# Patient Record
Sex: Male | Born: 1945 | Race: White | Hispanic: No | Smoking: Former smoker
Health system: Southern US, Community
[De-identification: ages and names within clinical notes are randomized; demographics above are authoritative.]

## PROBLEM LIST (undated history)

## (undated) DIAGNOSIS — C9590 Leukemia, unspecified not having achieved remission: Secondary | ICD-10-CM

## (undated) DIAGNOSIS — H409 Unspecified glaucoma: Secondary | ICD-10-CM

## (undated) DIAGNOSIS — M47816 Spondylosis without myelopathy or radiculopathy, lumbar region: Secondary | ICD-10-CM

## (undated) DIAGNOSIS — C9110 Chronic lymphocytic leukemia of B-cell type not having achieved remission: Principal | ICD-10-CM

## (undated) DIAGNOSIS — I2510 Atherosclerotic heart disease of native coronary artery without angina pectoris: Secondary | ICD-10-CM

## (undated) DIAGNOSIS — R0600 Dyspnea, unspecified: Secondary | ICD-10-CM

## (undated) DIAGNOSIS — R011 Cardiac murmur, unspecified: Secondary | ICD-10-CM

## (undated) DIAGNOSIS — I4891 Unspecified atrial fibrillation: Secondary | ICD-10-CM

## (undated) DIAGNOSIS — F1020 Alcohol dependence, uncomplicated: Secondary | ICD-10-CM

## (undated) DIAGNOSIS — G250 Essential tremor: Principal | ICD-10-CM

## (undated) DIAGNOSIS — F329 Major depressive disorder, single episode, unspecified: Secondary | ICD-10-CM

## (undated) DIAGNOSIS — E119 Type 2 diabetes mellitus without complications: Secondary | ICD-10-CM

## (undated) DIAGNOSIS — Q159 Congenital malformation of eye, unspecified: Secondary | ICD-10-CM

## (undated) DIAGNOSIS — M51369 Other intervertebral disc degeneration, lumbar region without mention of lumbar back pain or lower extremity pain: Secondary | ICD-10-CM

## (undated) DIAGNOSIS — D689 Coagulation defect, unspecified: Secondary | ICD-10-CM

## (undated) DIAGNOSIS — F1910 Other psychoactive substance abuse, uncomplicated: Secondary | ICD-10-CM

## (undated) DIAGNOSIS — G4730 Sleep apnea, unspecified: Secondary | ICD-10-CM

## (undated) DIAGNOSIS — I10 Essential (primary) hypertension: Secondary | ICD-10-CM

## (undated) DIAGNOSIS — M8580 Other specified disorders of bone density and structure, unspecified site: Secondary | ICD-10-CM

## (undated) DIAGNOSIS — M5136 Other intervertebral disc degeneration, lumbar region: Secondary | ICD-10-CM

## (undated) DIAGNOSIS — E785 Hyperlipidemia, unspecified: Secondary | ICD-10-CM

## (undated) DIAGNOSIS — C61 Malignant neoplasm of prostate: Secondary | ICD-10-CM

## (undated) DIAGNOSIS — I639 Cerebral infarction, unspecified: Secondary | ICD-10-CM

## (undated) DIAGNOSIS — F419 Anxiety disorder, unspecified: Secondary | ICD-10-CM

## (undated) DIAGNOSIS — K5790 Diverticulosis of intestine, part unspecified, without perforation or abscess without bleeding: Secondary | ICD-10-CM

## (undated) DIAGNOSIS — F32A Depression, unspecified: Secondary | ICD-10-CM

## (undated) DIAGNOSIS — M1990 Unspecified osteoarthritis, unspecified site: Secondary | ICD-10-CM

## (undated) DIAGNOSIS — H269 Unspecified cataract: Secondary | ICD-10-CM

## (undated) DIAGNOSIS — I219 Acute myocardial infarction, unspecified: Secondary | ICD-10-CM

## (undated) DIAGNOSIS — I499 Cardiac arrhythmia, unspecified: Secondary | ICD-10-CM

## (undated) DIAGNOSIS — D126 Benign neoplasm of colon, unspecified: Secondary | ICD-10-CM

## (undated) DIAGNOSIS — T7840XA Allergy, unspecified, initial encounter: Secondary | ICD-10-CM

## (undated) DIAGNOSIS — M810 Age-related osteoporosis without current pathological fracture: Secondary | ICD-10-CM

## (undated) HISTORY — PX: CATARACT EXTRACTION W/ INTRAOCULAR LENS IMPLANT: SHX1309

## (undated) HISTORY — PX: OTHER SURGICAL HISTORY: SHX169

## (undated) HISTORY — DX: Cardiac murmur, unspecified: R01.1

## (undated) HISTORY — DX: Alcohol dependence, uncomplicated: F10.20

## (undated) HISTORY — DX: Unspecified osteoarthritis, unspecified site: M19.90

## (undated) HISTORY — PX: CARDIAC CATHETERIZATION: SHX172

## (undated) HISTORY — DX: Age-related osteoporosis without current pathological fracture: M81.0

## (undated) HISTORY — DX: Sleep apnea, unspecified: G47.30

## (undated) HISTORY — DX: Diverticulosis of intestine, part unspecified, without perforation or abscess without bleeding: K57.90

## (undated) HISTORY — DX: Type 2 diabetes mellitus without complications: E11.9

## (undated) HISTORY — DX: Essential (primary) hypertension: I10

## (undated) HISTORY — DX: Hyperlipidemia, unspecified: E78.5

## (undated) HISTORY — DX: Other intervertebral disc degeneration, lumbar region without mention of lumbar back pain or lower extremity pain: M51.369

## (undated) HISTORY — PX: EYE SURGERY: SHX253

## (undated) HISTORY — PX: POLYPECTOMY: SHX149

## (undated) HISTORY — DX: Benign neoplasm of colon, unspecified: D12.6

## (undated) HISTORY — DX: Anxiety disorder, unspecified: F41.9

## (undated) HISTORY — DX: Cerebral infarction, unspecified: I63.9

## (undated) HISTORY — DX: Allergy, unspecified, initial encounter: T78.40XA

## (undated) HISTORY — DX: Unspecified glaucoma: H40.9

## (undated) HISTORY — DX: Chronic lymphocytic leukemia of B-cell type not having achieved remission: C91.10

## (undated) HISTORY — DX: Unspecified atrial fibrillation: I48.91

## (undated) HISTORY — DX: Depression, unspecified: F32.A

## (undated) HISTORY — DX: Atherosclerotic heart disease of native coronary artery without angina pectoris: I25.10

## (undated) HISTORY — DX: Other specified disorders of bone density and structure, unspecified site: M85.80

## (undated) HISTORY — DX: Unspecified cataract: H26.9

## (undated) HISTORY — PX: COLONOSCOPY: SHX174

## (undated) HISTORY — DX: Other psychoactive substance abuse, uncomplicated: F19.10

## (undated) HISTORY — DX: Spondylosis without myelopathy or radiculopathy, lumbar region: M47.816

## (undated) HISTORY — PX: CARDIAC VALVE REPLACEMENT: SHX585

## (undated) HISTORY — DX: Essential tremor: G25.0

## (undated) HISTORY — DX: Acute myocardial infarction, unspecified: I21.9

## (undated) HISTORY — DX: Leukemia, unspecified not having achieved remission: C95.90

## (undated) HISTORY — DX: Other intervertebral disc degeneration, lumbar region: M51.36

## (undated) HISTORY — DX: Malignant neoplasm of prostate: C61

## (undated) HISTORY — DX: Coagulation defect, unspecified: D68.9

## (undated) HISTORY — DX: Congenital malformation of eye, unspecified: Q15.9

## (undated) HISTORY — DX: Major depressive disorder, single episode, unspecified: F32.9

---

## 2000-05-22 DIAGNOSIS — R011 Cardiac murmur, unspecified: Secondary | ICD-10-CM

## 2000-05-22 HISTORY — DX: Cardiac murmur, unspecified: R01.1

## 2004-05-22 DIAGNOSIS — I219 Acute myocardial infarction, unspecified: Secondary | ICD-10-CM

## 2004-05-22 HISTORY — DX: Acute myocardial infarction, unspecified: I21.9

## 2004-08-04 HISTORY — PX: CORONARY ARTERY BYPASS GRAFT: SHX141

## 2013-07-02 ENCOUNTER — Telehealth: Payer: Self-pay | Admitting: Hematology and Oncology

## 2013-07-02 NOTE — Telephone Encounter (Signed)
C/D 07/02/13 for appt. 07/08/13

## 2013-07-02 NOTE — Telephone Encounter (Signed)
NEW PATIENT SCHEDULED FOR 02/17 @ 9:30 W/DR. [place].  DX- CLL WELCOME PACKET MAILED/EMAIL

## 2013-07-08 ENCOUNTER — Encounter: Payer: Self-pay | Admitting: Hematology and Oncology

## 2013-07-08 ENCOUNTER — Encounter (INDEPENDENT_AMBULATORY_CARE_PROVIDER_SITE_OTHER): Payer: Self-pay

## 2013-07-08 ENCOUNTER — Ambulatory Visit (HOSPITAL_BASED_OUTPATIENT_CLINIC_OR_DEPARTMENT_OTHER): Payer: Medicare Other | Admitting: Hematology and Oncology

## 2013-07-08 ENCOUNTER — Ambulatory Visit: Payer: Medicare Other

## 2013-07-08 ENCOUNTER — Other Ambulatory Visit (HOSPITAL_COMMUNITY)
Admission: RE | Admit: 2013-07-08 | Discharge: 2013-07-08 | Disposition: A | Discharge status: Home / Self Care | Payer: Medicare Other | Source: Ambulatory Visit | Attending: Hematology and Oncology | Admitting: Hematology and Oncology

## 2013-07-08 ENCOUNTER — Ambulatory Visit (HOSPITAL_BASED_OUTPATIENT_CLINIC_OR_DEPARTMENT_OTHER): Payer: Medicare Other

## 2013-07-08 ENCOUNTER — Telehealth: Payer: Self-pay | Admitting: Hematology and Oncology

## 2013-07-08 VITALS — BP 129/67 | HR 65 | Temp 97.7°F | Resp 18 | Ht 69.0 in | Wt 205.3 lb

## 2013-07-08 DIAGNOSIS — C61 Malignant neoplasm of prostate: Secondary | ICD-10-CM | POA: Diagnosis not present

## 2013-07-08 DIAGNOSIS — C9110 Chronic lymphocytic leukemia of B-cell type not having achieved remission: Secondary | ICD-10-CM

## 2013-07-08 DIAGNOSIS — D7289 Other specified disorders of white blood cells: Secondary | ICD-10-CM | POA: Diagnosis not present

## 2013-07-08 HISTORY — DX: Malignant neoplasm of prostate: C61

## 2013-07-08 HISTORY — DX: Chronic lymphocytic leukemia of B-cell type not having achieved remission: C91.10

## 2013-07-08 LAB — COMPREHENSIVE METABOLIC PANEL
ALT: 22 U/L (ref 0–53)
AST: 20 U/L (ref 0–37)
Albumin: 4.4 g/dL (ref 3.5–5.2)
Alkaline Phosphatase: 37 U/L — ABNORMAL LOW (ref 39–117)
BUN: 14 mg/dL (ref 6–23)
CALCIUM: 9.3 mg/dL (ref 8.4–10.5)
CO2: 25 mEq/L (ref 19–32)
CREATININE: 0.84 mg/dL (ref 0.50–1.35)
Chloride: 107 mEq/L (ref 96–112)
Glucose, Bld: 90 mg/dL (ref 70–99)
Potassium: 4 mEq/L (ref 3.5–5.3)
Sodium: 141 mEq/L (ref 135–145)
Total Bilirubin: 0.3 mg/dL (ref 0.2–1.2)
Total Protein: 6.7 g/dL (ref 6.0–8.3)

## 2013-07-08 LAB — CBC WITH DIFFERENTIAL/PLATELET
BASO%: 0.3 % (ref 0.0–2.0)
Basophils Absolute: 0 10*3/uL (ref 0.0–0.1)
EOS ABS: 0.2 10*3/uL (ref 0.0–0.5)
EOS%: 1.1 % (ref 0.0–7.0)
HEMATOCRIT: 41.2 % (ref 38.4–49.9)
HGB: 14.2 g/dL (ref 13.0–17.1)
LYMPH#: 8.3 10*3/uL — AB (ref 0.9–3.3)
LYMPH%: 51.7 % — ABNORMAL HIGH (ref 14.0–49.0)
MCH: 30.9 pg (ref 27.2–33.4)
MCHC: 34.5 g/dL (ref 32.0–36.0)
MCV: 89.8 fL (ref 79.3–98.0)
MONO#: 0.8 10*3/uL (ref 0.1–0.9)
MONO%: 5 % (ref 0.0–14.0)
NEUT%: 41.9 % (ref 39.0–75.0)
NEUTROS ABS: 6.7 10*3/uL — AB (ref 1.5–6.5)
Platelets: 238 10*3/uL (ref 140–400)
RBC: 4.59 10*6/uL (ref 4.20–5.82)
RDW: 13.1 % (ref 11.0–14.6)
WBC: 16 10*3/uL — ABNORMAL HIGH (ref 4.0–10.3)

## 2013-07-08 LAB — LACTATE DEHYDROGENASE: LDH: 178 U/L (ref 94–250)

## 2013-07-08 LAB — TECHNOLOGIST REVIEW

## 2013-07-08 NOTE — Telephone Encounter (Signed)
Gave pt appt for lab and MD  for August 2015 °

## 2013-07-09 DIAGNOSIS — N529 Male erectile dysfunction, unspecified: Secondary | ICD-10-CM | POA: Diagnosis not present

## 2013-07-09 DIAGNOSIS — Z8546 Personal history of malignant neoplasm of prostate: Secondary | ICD-10-CM | POA: Diagnosis not present

## 2013-07-09 LAB — PSA: PSA: 0.02 ng/mL (ref <=4.00)

## 2013-07-09 LAB — IGG, IGA, IGM
IGG (IMMUNOGLOBIN G), SERUM: 572 mg/dL — AB (ref 650–1600)
IGM, SERUM: 30 mg/dL — AB (ref 41–251)
IgA: 129 mg/dL (ref 68–379)

## 2013-07-09 NOTE — Progress Notes (Signed)
[facility] CONSULT NOTE  Patient has no care team.  CHIEF COMPLAINTS/PURPOSE OF CONSULTATION:  To establish care for prior diagnosis of CLL   HISTORY OF PRESENTING ILLNESS:  Rodney Friedman 68 y.o. male is here because of prior diagnosis of CLL. According to the patient, when he was in his 57s, his white count was around 11. Over the past 30 years, he was being followed and his white count got progressively worse, peak at 19. He was subsequently diagnosed with CLL and was being observed only. The patient also have family history of cancer and PSA came back abnormal last year. He was subsequently found to have prostate cancer and underwent radiation therapy and monthly whereupon injection. He has lost about 20 pounds of weight from his treatment. The patient have occasional sweats. Denies any history of recurrent infection, history of skin cancer or abnormal lymphadenopathy. MEDICAL HISTORY:  Past Medical History  Diagnosis Date  . Diabetes mellitus without complication   . Leukemia     CLL  . Prostate cancer   . Cataract   . Hypertension   . 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 Prostate cancer 07/08/2013    SURGICAL HISTORY: Past Surgical History  Procedure Laterality Date  . 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 Colonoscopy      SOCIAL HISTORY: History   Social History  . Marital Status: Unknown    Spouse Name: N/A    Number of Children: N/A  . Years of Education: N/A   Occupational History  . Not on file.   Social History Main Topics  . Smoking status: Current Every Day Smoker -- 0.50 packs/day for 50 years  . Smokeless tobacco: Never Used  . Alcohol Use: No  . Drug Use: No  . Sexual Activity: Not on file   Other Topics Concern  . Not on file   Social History Narrative  . No narrative on file    FAMILY HISTORY: Family History  Problem Relation Age of Onset  . Cancer Mother     colon cancer  . Cancer Father     prostate  ca    ALLERGIES:  has No Known Allergies.  MEDICATIONS:  Current Outpatient Prescriptions  Medication Sig Dispense Refill  . amLODipine (NORVASC) 10 MG tablet Take 10 mg by mouth daily.      Marland Kitchen buPROPion (WELLBUTRIN SR) 150 MG 12 hr tablet Take 150 mg by mouth 2 (two) times daily.      Marland Kitchen lamoTRIgine (LAMICTAL) 100 MG tablet Take 300 mg by mouth daily.      Marland Kitchen latanoprost (XALATAN) 0.005 % ophthalmic solution Once a day every third day      . Leuprolide Acetate (LUPRON DEPOT IM) Inject into the muscle every 30 (thirty) days.      . metFORMIN (GLUCOPHAGE) 500 MG tablet Take 500 mg by mouth 2 (two) times daily.      . metoprolol (TOPROL-XL) 200 MG 24 hr tablet Take 100 mg by mouth daily.      . pravastatin (PRAVACHOL) 80 MG tablet Take 80 mg by mouth daily.      . valsartan (DIOVAN) 320 MG tablet Take 320 mg by mouth daily.      Marland Kitchen warfarin (COUMADIN) 4 MG tablet Take 4 mg by mouth daily.       No current facility-administered medications for this visit.    REVIEW OF SYSTEMS:   Constitutional: Denies fevers, chills  Eyes: Denies blurriness of vision, double vision  or watery eyes Ears, nose, mouth, throat, and face: Denies mucositis or sore throat Respiratory: Denies cough, dyspnea or wheezes Cardiovascular: Denies palpitation, chest discomfort or lower extremity swelling Gastrointestinal:  Denies nausea, heartburn or change in bowel habits Skin: Denies abnormal skin rashes Lymphatics: Denies new lymphadenopathy or easy bruising Neurological:Denies numbness, tingling or new weaknesses Behavioral/Psych: Mood is stable, no new changes  All other systems were reviewed with the patient and are negative.  PHYSICAL EXAMINATION: ECOG PERFORMANCE STATUS: 0 - Asymptomatic  Filed Vitals:   07/08/13 0954  BP: 129/67  Pulse: 65  Temp: 97.7 F (36.5 C)  Resp: 18   Filed Weights   07/08/13 0954  Weight: 205 lb 4.8 oz (93.123 kg)    GENERAL:alert, no distress and comfortable SKIN: skin  color, texture, turgor are normal, no rashes or significant lesions EYES: normal, conjunctiva are pink and non-injected, sclera clear OROPHARYNX:no exudate, no erythema and lips, buccal mucosa, and tongue normal  NECK: supple, thyroid normal size, non-tender, without nodularity LYMPH:  no palpable lymphadenopathy in the cervical, axillary or inguinal LUNGS: clear to auscultation and percussion with normal breathing effort HEART: regular rate & rhythm and no murmurs and no lower extremity edema ABDOMEN:abdomen soft, non-tender and normal bowel sounds Musculoskeletal:no cyanosis of digits and no clubbing  PSYCH: alert & oriented x 3 with fluent speech NEURO: no focal motor/sensory deficits  LABORATORY DATA:  I have reviewed the data as listed Lab Results  Component Value Date   WBC 16.0* 07/08/2013   HGB 14.2 07/08/2013   HCT 41.2 07/08/2013   MCV 89.8 07/08/2013   PLT 238 07/08/2013   Lab Results  Component Value Date   NA 141 07/08/2013   K 4.0 07/08/2013   CL 107 07/08/2013   CO2 25 07/08/2013   ASSESSMENT & PLAN:  #1 CLL I will proceed with repeating some blood work and FISH analysis to understand the behavior of his CLL. Given his relative indolent course, I plan to see him back one more time in 6 months and then yearly after that unless he has aggressive features #2 history of prostate cancer The patient requested a PSA test today. I will call the patient with results. I would defer all treatment and recommendations to his urologist #3 preventive care He is up-to-date with influenza vaccination and pneumonia vaccination  Orders Placed This Encounter  Procedures  . PSA    Standing Status: Future     Number of Occurrences: 1     Standing Expiration Date: 07/08/2014  . Comprehensive metabolic panel    Standing Status: Future     Number of Occurrences: 1     Standing Expiration Date: 07/08/2014  . CBC with Differential    Standing Status: Standing     Number of Occurrences: 2      Standing Expiration Date: 07/08/2014  . Lactate dehydrogenase    Standing Status: Future     Number of Occurrences: 1     Standing Expiration Date: 07/08/2014  . IgG, IgA, IgM    Standing Status: Future     Number of Occurrences: 1     Standing Expiration Date: 07/08/2014  . FISH, Peripheral Blood    FISH for CLL    Standing Status: Future     Number of Occurrences: 1     Standing Expiration Date: 07/08/2014  . Flow Cytometry    CLL    Standing Status: Future     Number of Occurrences: 1     Standing  Expiration Date: 07/08/2014    All questions were answered. The patient knows to call the clinic with any problems, questions or concerns. I spent 40 minutes counseling the patient face to face. The total time spent in the appointment was 55 minutes and more than 50% was on counseling.     [place], [place], MD 07/09/2013 10:07 AM

## 2013-07-10 LAB — FLOW CYTOMETRY

## 2013-07-15 LAB — FISH, PERIPHERAL BLOOD

## 2013-07-15 LAB — TISSUE HYBRIDIZATION TO NCBH

## 2013-07-17 ENCOUNTER — Telehealth: Payer: Self-pay | Admitting: *Deleted

## 2013-07-17 NOTE — Telephone Encounter (Signed)
Pt called looking for his PSA level on My Chart.  By the time I called him back he got the results in a separate email.  He understands the result is normal.

## 2013-08-06 DIAGNOSIS — C61 Malignant neoplasm of prostate: Secondary | ICD-10-CM | POA: Diagnosis not present

## 2013-08-14 DIAGNOSIS — I2510 Atherosclerotic heart disease of native coronary artery without angina pectoris: Secondary | ICD-10-CM | POA: Diagnosis not present

## 2013-08-14 DIAGNOSIS — E669 Obesity, unspecified: Secondary | ICD-10-CM | POA: Diagnosis not present

## 2013-08-14 DIAGNOSIS — C61 Malignant neoplasm of prostate: Secondary | ICD-10-CM | POA: Diagnosis not present

## 2013-08-14 DIAGNOSIS — Z7901 Long term (current) use of anticoagulants: Secondary | ICD-10-CM | POA: Diagnosis not present

## 2013-08-14 DIAGNOSIS — H409 Unspecified glaucoma: Secondary | ICD-10-CM | POA: Diagnosis not present

## 2013-08-21 DIAGNOSIS — Z7901 Long term (current) use of anticoagulants: Secondary | ICD-10-CM | POA: Diagnosis not present

## 2013-09-03 ENCOUNTER — Ambulatory Visit (INDEPENDENT_AMBULATORY_CARE_PROVIDER_SITE_OTHER): Payer: Medicare Other | Admitting: Family Medicine

## 2013-09-03 VITALS — BP 138/62 | HR 63 | Temp 98.3°F | Resp 16 | Ht 69.0 in | Wt 202.8 lb

## 2013-09-03 DIAGNOSIS — C61 Malignant neoplasm of prostate: Secondary | ICD-10-CM

## 2013-09-03 DIAGNOSIS — R7309 Other abnormal glucose: Secondary | ICD-10-CM | POA: Diagnosis not present

## 2013-09-03 DIAGNOSIS — Z5181 Encounter for therapeutic drug level monitoring: Secondary | ICD-10-CM

## 2013-09-03 DIAGNOSIS — Z8249 Family history of ischemic heart disease and other diseases of the circulatory system: Secondary | ICD-10-CM

## 2013-09-03 DIAGNOSIS — I2510 Atherosclerotic heart disease of native coronary artery without angina pectoris: Secondary | ICD-10-CM

## 2013-09-03 DIAGNOSIS — Z86010 Personal history of colonic polyps: Secondary | ICD-10-CM

## 2013-09-03 DIAGNOSIS — I25118 Atherosclerotic heart disease of native coronary artery with other forms of angina pectoris: Secondary | ICD-10-CM | POA: Insufficient documentation

## 2013-09-03 DIAGNOSIS — R7303 Prediabetes: Secondary | ICD-10-CM | POA: Insufficient documentation

## 2013-09-03 DIAGNOSIS — Z7901 Long term (current) use of anticoagulants: Secondary | ICD-10-CM | POA: Diagnosis not present

## 2013-09-03 LAB — POCT CBC
Granulocyte percent: 46.9 %G (ref 37–80)
HCT, POC: 47 % (ref 43.5–53.7)
HEMOGLOBIN: 15.4 g/dL (ref 14.1–18.1)
Lymph, poc: 8 — AB (ref 0.6–3.4)
MCH, POC: 31.1 pg (ref 27–31.2)
MCHC: 32.8 g/dL (ref 31.8–35.4)
MCV: 95 fL (ref 80–97)
MID (cbc): 0.8 (ref 0–0.9)
MPV: 9.7 fL (ref 0–99.8)
PLATELET COUNT, POC: 270 10*3/uL (ref 142–424)
POC GRANULOCYTE: 7.7 — AB (ref 2–6.9)
POC LYMPH PERCENT: 48.5 %L (ref 10–50)
POC MID %: 4.6 %M (ref 0–12)
RBC: 4.95 M/uL (ref 4.69–6.13)
RDW, POC: 13.7 %
WBC: 16.5 10*3/uL — AB (ref 4.6–10.2)

## 2013-09-03 LAB — PROTIME-INR
INR: 2.05 — ABNORMAL HIGH (ref <1.50)
PROTHROMBIN TIME: 22.7 s — AB (ref 11.6–15.2)

## 2013-09-03 LAB — BASIC METABOLIC PANEL
BUN: 12 mg/dL (ref 6–23)
CHLORIDE: 105 meq/L (ref 96–112)
CO2: 27 mEq/L (ref 19–32)
CREATININE: 0.78 mg/dL (ref 0.50–1.35)
Calcium: 9.9 mg/dL (ref 8.4–10.5)
GLUCOSE: 116 mg/dL — AB (ref 70–99)
Potassium: 4.2 mEq/L (ref 3.5–5.3)
Sodium: 143 mEq/L (ref 135–145)

## 2013-09-03 LAB — GLUCOSE, POCT (MANUAL RESULT ENTRY): POC Glucose: 104 mg/dl — AB (ref 70–99)

## 2013-09-03 LAB — POCT GLYCOSYLATED HEMOGLOBIN (HGB A1C): Hemoglobin A1C: 5.6

## 2013-09-03 NOTE — Progress Notes (Signed)
Subjective:     Patient ID: Rodney Friedman, male   DOB: May 21, 1946, 68 y.o.   MRN: [id number]  HPI 68 yr male here to establish care.  Has a significant pmdhx including prostate ca, CLL for nearly 20 years, hx of triple bypass in 2006 on warfarin, and prediabetes.  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    Heart disease: Pt taking 4 mg of warfarin 6 days per week and 6 mg 1 day per week; this was an increase from 4mg  per day at her last check.  Needs to check PT/INR.  Will likely need a cardiologist referal.  No issues currently.   Prostate Ca: Needs a PSA, was diagnosed with prostate ca in 2014 tx w radiation on lupron currently. Having some leakage but no change in urinary habits since dx.  On radiation tx.  Urology apt in August.  Followed at the cancer center.   CLL: Also dx with CLL 15 years ago has never had a crisis. Last checked CBC in Feb 2015.  Wbc count runs 15- 20k at baseline per hier report   Also dx w depression, bipolar and anxiety for which he takes lamictal and wellbutrin since 2006.  Seems conroled per pt.  Pt goes to [employer] which seems to help w anxiety he states.    Prediabetes:  Metformin for the past 2 years last for a prediabetic state does not know last A1C. Recently lost 20 lbs.   Other: Last colonoscopy in 2012,  Removed 5 polyps.  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  Sees Dr. 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       Review of Systems  Constitutional: Negative for fever, chills, diaphoresis, fatigue and unexpected weight change.  HENT: Negative for congestion, rhinorrhea and sneezing.   Eyes: Negative for pain and discharge.  Respiratory: Negative for cough, chest tightness and shortness of breath.   Cardiovascular: Negative for chest pain, palpitations and leg swelling.  Gastrointestinal:  Negative for abdominal pain, diarrhea, constipation and blood in stool.  Endocrine: Negative for polydipsia and polyuria.  Genitourinary: Negative for urgency, frequency, hematuria, penile pain and testicular pain.  Musculoskeletal: Negative for arthralgias, back pain and joint swelling.  Skin: Negative for color change and rash.  Allergic/Immunologic: Negative for food allergies.  Neurological: Negative for dizziness, syncope, light-headedness and headaches.  Psychiatric/Behavioral: The patient is nervous/anxious.        Objective:   Physical Exam  Constitutional: He is oriented to person, place, and time. He appears well-developed and well-nourished.  HENT:  Head: Normocephalic and atraumatic.  Cardiovascular: Normal rate and regular rhythm.  Exam reveals no gallop and no friction rub.   Murmur heard. Pulmonary/Chest: Effort normal and breath sounds normal. No respiratory distress. He has no wheezes. He has no rales.  Abdominal: Soft. He exhibits no distension.  Musculoskeletal: Normal range of motion. He exhibits no edema and no tenderness.  Lymphadenopathy:    He has cervical adenopathy.  Neurological: He is alert and oriented to person, place, and time. No cranial nerve deficit.  Skin: Skin is warm and dry.  Psychiatric: His behavior is normal. Judgment and thought content normal.   Filed Vitals:   09/03/13 0820  BP: 138/62  Pulse: 63  Temp: 98.3 F (36.8 C)  Resp: 16   Results for orders placed in visit  on 09/03/13  POCT GLYCOSYLATED HEMOGLOBIN (HGB A1C)      Result Value Ref Range   Hemoglobin A1C 5.6    GLUCOSE, POCT (MANUAL RESULT ENTRY)      Result Value Ref Range   POC Glucose 104 (*) 70 - 99 mg/dl  POCT CBC      Result Value Ref Range   WBC 16.5 (*) 4.6 - 10.2 K/uL   Lymph, poc 8.0 (*) 0.6 - 3.4   POC LYMPH PERCENT 48.5  10 - 50 %L   MID (cbc) 0.8  0 - 0.9   POC MID % 4.6  0 - 12 %M   POC Granulocyte 7.7 (*) 2 - 6.9   Granulocyte percent 46.9  37 - 80 %G    RBC 4.95  4.69 - 6.13 M/uL   Hemoglobin 15.4  14.1 - 18.1 g/dL   HCT, POC 47.0  43.5 - 53.7 %   MCV 95.0  80 - 97 fL   MCH, POC 31.1  27 - 31.2 pg   MCHC 32.8  31.8 - 35.4 g/dL   RDW, POC 13.7     Platelet Count, POC 270  142 - 424 K/uL   MPV 9.7  0 - 99.8 fL        Assessment:    1. Pt here to establish care.        Plan:  Pre-diabetes - Plan: POCT glycosylated hemoglobin (Hb O2D), Basic metabolic panel, POCT glucose (manual entry), POCT CBC  Family history of CABG - Plan: POCT CBC, Ambulatory referral to Cardiology  Prostate cancer - Plan: PSA, POCT CBC  Encounter for monitoring coumadin therapy - Plan: Protime-INR, POCT CBC  Personal history of colonic polyps - Plan: Ambulatory referral to Gastroenterology  Rodney Friedman is here to establish care today.  He has significant medical problems including CLL, prostate cancer, CAD/ history of CABG.  Needs referrals to cardiology and GI.   Check labs today as above He will plan to establish with coumadin clinic as we do not have a PT/INR machine in house.   A1c is fine BP controlled He is already in with urology and heme/ onc

## 2013-09-03 NOTE — Patient Instructions (Signed)
I will be in touch with your PT/INR when it comes in.   We will get your in to see cardiology and a coumadin clinic.    Your BP and A1c look good today.   We will make you a referral, but you can certainly call and make your own appt with cardiology.  Please let them know you also need coumadin monitoring  [facility] [address] [place], [place] [address] Get Driving Directions View Web Site  Main: [phone]

## 2013-09-04 ENCOUNTER — Encounter: Payer: Self-pay | Admitting: Internal Medicine

## 2013-09-04 LAB — PSA: PSA: 0.02 ng/mL (ref <=4.00)

## 2013-10-06 DIAGNOSIS — H02839 Dermatochalasis of unspecified eye, unspecified eyelid: Secondary | ICD-10-CM | POA: Diagnosis not present

## 2013-10-06 DIAGNOSIS — H31019 Macula scars of posterior pole (postinflammatory) (post-traumatic), unspecified eye: Secondary | ICD-10-CM | POA: Diagnosis not present

## 2013-10-06 DIAGNOSIS — H409 Unspecified glaucoma: Secondary | ICD-10-CM | POA: Diagnosis not present

## 2013-10-06 DIAGNOSIS — H4011X0 Primary open-angle glaucoma, stage unspecified: Secondary | ICD-10-CM | POA: Diagnosis not present

## 2013-10-06 DIAGNOSIS — IMO0002 Reserved for concepts with insufficient information to code with codable children: Secondary | ICD-10-CM | POA: Insufficient documentation

## 2013-10-06 DIAGNOSIS — H2510 Age-related nuclear cataract, unspecified eye: Secondary | ICD-10-CM | POA: Diagnosis not present

## 2013-10-20 ENCOUNTER — Telehealth: Payer: Self-pay

## 2013-10-20 NOTE — Telephone Encounter (Signed)
Reached pt.  Scheduled OV for anti-coag therapy.

## 2013-10-20 NOTE — Telephone Encounter (Signed)
Attempted to call pt concerning previous procedure in [place] 3 +yrs ago and Coumadin.  Busy signal.  Pt scheduled for PV tomorrow.

## 2013-10-22 ENCOUNTER — Telehealth: Payer: Self-pay | Admitting: *Deleted

## 2013-10-22 NOTE — Telephone Encounter (Signed)
10/22/2013   RE: Rodney Friedman DOB: July 25, 1945 MRN: [id number]   Dear Dr. Lorelei Pont,    We have scheduled the above patient for an Colonoscopy. Our records show that he is on Coumadin.   Please advise as to how long the patient may come off his therapy of Coumadin prior to the procedure, which is scheduled for 11-04-2013.  Please fax back/ or route the completed form to Evette Georges., CMA.   Sincerely,    Carlyle Dolly

## 2013-10-24 ENCOUNTER — Encounter: Payer: Self-pay | Admitting: Family Medicine

## 2013-10-24 NOTE — Telephone Encounter (Signed)
Called and [facility], will also send mychart message.  I do need him to see cardiology/ coumadin clinic to manage his coumadin/ heart issues and also make recommendation regarding if he can stop his coumadin for a colonoscopy.  Asked him to please call [facility] as we had discussed.

## 2013-10-24 NOTE — Telephone Encounter (Signed)
Message copied by Carlyle Dolly on Fri Oct 24, 2013  2:08 PM ------      Message from: Darreld Mclean      Created: Fri Oct 24, 2013  1:07 PM       Hi Claiborne Billings,            I did see [name] once a couple of months ago to establish care and wrote for his coumadin.  The plan was for him to see cardiology and coumadin clinic to manage his coumadin and histoyr of heart disease.  However he has not yet made this appointment.  I am not familiar enough with his heart condition to make a recommendation regarding his coumadin.  I will again request that he follow-up with cardiology but the colonoscopy may have to be delayed.              JC ------

## 2013-10-24 NOTE — Telephone Encounter (Signed)
Darreld Mclean, MD Carlyle Dolly, [place],   I did see [name] once a couple of months ago to establish care and wrote for his coumadin. The plan was for him to see cardiology and coumadin clinic to manage his coumadin and histoyr of heart disease. However he has not yet made this appointment. I am not familiar enough with his heart condition to make a recommendation regarding his coumadin. I will again request that he follow-up with cardiology but the colonoscopy may have to be delayed.   JC      I called patient and advised that he will need to establish with [facility] I advised patient that per Dr. 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

## 2013-10-28 ENCOUNTER — Ambulatory Visit: Payer: 59 | Admitting: Gastroenterology

## 2013-11-04 ENCOUNTER — Encounter: Payer: 59 | Admitting: Internal Medicine

## 2013-11-25 DIAGNOSIS — H02839 Dermatochalasis of unspecified eye, unspecified eyelid: Secondary | ICD-10-CM | POA: Diagnosis not present

## 2013-11-25 DIAGNOSIS — H2510 Age-related nuclear cataract, unspecified eye: Secondary | ICD-10-CM | POA: Diagnosis not present

## 2013-11-25 DIAGNOSIS — H31019 Macula scars of posterior pole (postinflammatory) (post-traumatic), unspecified eye: Secondary | ICD-10-CM | POA: Diagnosis not present

## 2013-11-25 DIAGNOSIS — H409 Unspecified glaucoma: Secondary | ICD-10-CM | POA: Diagnosis not present

## 2013-11-25 DIAGNOSIS — H4011X0 Primary open-angle glaucoma, stage unspecified: Secondary | ICD-10-CM | POA: Diagnosis not present

## 2013-12-03 DIAGNOSIS — H4011X0 Primary open-angle glaucoma, stage unspecified: Secondary | ICD-10-CM | POA: Diagnosis not present

## 2013-12-03 DIAGNOSIS — H5200 Hypermetropia, unspecified eye: Secondary | ICD-10-CM | POA: Diagnosis not present

## 2013-12-03 DIAGNOSIS — H31019 Macula scars of posterior pole (postinflammatory) (post-traumatic), unspecified eye: Secondary | ICD-10-CM | POA: Diagnosis not present

## 2013-12-08 ENCOUNTER — Ambulatory Visit (INDEPENDENT_AMBULATORY_CARE_PROVIDER_SITE_OTHER): Payer: Medicare Other | Admitting: Emergency Medicine

## 2013-12-08 VITALS — BP 118/80 | HR 59 | Temp 98.3°F | Resp 16 | Ht 68.75 in | Wt 209.2 lb

## 2013-12-08 DIAGNOSIS — I2510 Atherosclerotic heart disease of native coronary artery without angina pectoris: Secondary | ICD-10-CM

## 2013-12-08 DIAGNOSIS — C61 Malignant neoplasm of prostate: Secondary | ICD-10-CM

## 2013-12-08 DIAGNOSIS — Z5181 Encounter for therapeutic drug level monitoring: Secondary | ICD-10-CM | POA: Diagnosis not present

## 2013-12-08 DIAGNOSIS — E782 Mixed hyperlipidemia: Secondary | ICD-10-CM | POA: Diagnosis not present

## 2013-12-08 DIAGNOSIS — Z7901 Long term (current) use of anticoagulants: Secondary | ICD-10-CM | POA: Diagnosis not present

## 2013-12-08 DIAGNOSIS — E119 Type 2 diabetes mellitus without complications: Secondary | ICD-10-CM

## 2013-12-08 DIAGNOSIS — I10 Essential (primary) hypertension: Secondary | ICD-10-CM

## 2013-12-08 LAB — CBC
HEMATOCRIT: 39.1 % (ref 39.0–52.0)
Hemoglobin: 14 g/dL (ref 13.0–17.0)
MCH: 30.8 pg (ref 26.0–34.0)
MCHC: 35.8 g/dL (ref 30.0–36.0)
MCV: 86.1 fL (ref 78.0–100.0)
PLATELETS: 224 10*3/uL (ref 150–400)
RBC: 4.54 MIL/uL (ref 4.22–5.81)
RDW: 14.1 % (ref 11.5–15.5)
WBC: 16.1 10*3/uL — AB (ref 4.0–10.5)

## 2013-12-08 LAB — POCT GLYCOSYLATED HEMOGLOBIN (HGB A1C): HEMOGLOBIN A1C: 5.8

## 2013-12-08 NOTE — Progress Notes (Signed)
[facility] [address] [place] [address] [phone]  Date:  12/08/2013   Name:  Rodney Friedman   DOB:  02/17/1946   MRN:  [id number]  PCP:  Berkley Harvey, NP    Chief Complaint: Lab   History of Present Illness:  Mesiah Friedman is a 68 y.o. very pleasant male patient who presents with the following:  Moved from [place].  Has history of CLL and prostate CA.  On coumadin for post CABG. Last seen in April.  Pending appt with cardiology and urology. No angina, shortness of breath.  No nausea or vomiting.  No fever or chills.  No improvement with over the counter medications or other home remedies. Denies other complaint or health concern today.   Patient Active Problem List   Diagnosis Date Noted  . CAD (coronary artery disease) 09/03/2013  . Pre-diabetes 09/03/2013  . 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 Prostate cancer 07/08/2013    Past Medical History  Diagnosis Date  . Diabetes mellitus without complication   . Leukemia     CLL  . Prostate cancer   . Cataract   . Hypertension   . 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 Prostate cancer 07/08/2013  . Anxiety   . Depression   . Heart murmur   . Myocardial infarction   . Substance abuse   . Tuberculosis     Past Surgical History  Procedure Laterality Date  . 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 Colonoscopy    . Eye surgery      History  Substance Use Topics  . Smoking status: Current Every Day Smoker -- 0.50 packs/day for 50 years  . Smokeless tobacco: Never Used  . Alcohol Use: No    Family History  Problem Relation Age of Onset  . Cancer Mother     colon cancer  . Cancer Father     prostate ca  . Hypertension Father     No Known Allergies  Medication list has been reviewed and updated.  Current Outpatient Prescriptions on File Prior to Visit  Medication Sig Dispense Refill  . amLODipine (NORVASC) 10 MG tablet Take 10 mg by mouth daily.       Marland Kitchen buPROPion (WELLBUTRIN SR) 150 MG 12 hr tablet Take 150 mg by mouth 2 (two) times daily.      Marland Kitchen lamoTRIgine (LAMICTAL) 100 MG tablet Take 300 mg by mouth daily.      Marland Kitchen latanoprost (XALATAN) 0.005 % ophthalmic solution Once a day every third day      . Leuprolide Acetate (LUPRON DEPOT IM) Inject into the muscle every 6 (six) months.       . metFORMIN (GLUCOPHAGE) 500 MG tablet Take 500 mg by mouth 2 (two) times daily.      . metoprolol (TOPROL-XL) 200 MG 24 hr tablet Take 100 mg by mouth daily.      . pravastatin (PRAVACHOL) 80 MG tablet Take 80 mg by mouth daily.      . valsartan (DIOVAN) 320 MG tablet Take 320 mg by mouth daily.      Marland Kitchen warfarin (COUMADIN) 4 MG tablet Take 4 mg by mouth daily.       No current facility-administered medications on file prior to visit.    Review of Systems:  As per HPI, otherwise negative.    Physical Examination: Filed Vitals:   12/08/13 1424  BP: 118/80  Pulse: 59  Temp: 98.3 F (36.8 C)  Resp: 16   Filed Vitals:   12/08/13 1424  Height: 5' 8.75" (1.746 m)  Weight: 209 lb 3.2 oz (94.892 kg)   Body mass index is 31.13 kg/(m^2). Ideal Body Weight: Weight in (lb) to have BMI = 25: 167.7  GEN: WDWN, NAD, Non-toxic, A & O x 3 HEENT: Atraumatic, Normocephalic. Neck supple. No masses, No LAD. Ears and Nose: No external deformity. CV: IRRR, loud harsh murmur No /G/R. No JVD. No thrill. No extra heart sounds. PULM: CTA B, no wheezes, crackles, rhonchi. No retractions. No resp. distress. No accessory muscle use. ABD: S, NT, ND, +BS. No rebound. No HSM. EXTR: No c/c/e NEURO Normal gait.  PSYCH: Normally interactive. Conversant. Not depressed or anxious appearing.  Calm demeanor.    Assessment and Plan: CLL Prostate CA  Signed,  Ellison Carwin, MD    Results for orders placed in visit on 12/08/13  CBC      Result Value Ref Range   WBC 16.1 (*) 4.0 - 10.5 K/uL   RBC 4.54  4.22 - 5.81 MIL/uL   Hemoglobin 14.0  13.0 - 17.0 g/dL    HCT 39.1  39.0 - 52.0 %   MCV 86.1  78.0 - 100.0 fL   MCH 30.8  26.0 - 34.0 pg   MCHC 35.8  30.0 - 36.0 g/dL   RDW 14.1  11.5 - 15.5 %   Platelets 224  150 - 400 K/uL  COMPREHENSIVE METABOLIC PANEL      Result Value Ref Range   Sodium 142  135 - 145 mEq/L   Potassium 4.2  3.5 - 5.3 mEq/L   Chloride 108  96 - 112 mEq/L   CO2 21  19 - 32 mEq/L   Glucose, Bld 90  70 - 99 mg/dL   BUN 14  6 - 23 mg/dL   Creat 0.80  0.50 - 1.35 mg/dL   Total Bilirubin 0.5  0.2 - 1.2 mg/dL   Alkaline Phosphatase 43  39 - 117 U/L   AST 15  0 - 37 U/L   ALT 15  0 - 53 U/L   Total Protein 6.5  6.0 - 8.3 g/dL   Albumin 4.2  3.5 - 5.2 g/dL   Calcium 9.2  8.4 - 10.5 mg/dL  PSA      Result Value Ref Range   PSA 0.04  <=4.00 ng/mL  LIPID PANEL      Result Value Ref Range   Cholesterol 219 (*) 0 - 200 mg/dL   Triglycerides 237 (*) <150 mg/dL   HDL 35 (*) >39 mg/dL   Total CHOL/HDL Ratio 6.3     VLDL 47 (*) 0 - 40 mg/dL   LDL Cholesterol 137 (*) 0 - 99 mg/dL  POCT GLYCOSYLATED HEMOGLOBIN (HGB A1C)      Result Value Ref Range   Hemoglobin A1C 5.8

## 2013-12-09 LAB — COMPREHENSIVE METABOLIC PANEL
ALT: 15 U/L (ref 0–53)
AST: 15 U/L (ref 0–37)
Albumin: 4.2 g/dL (ref 3.5–5.2)
Alkaline Phosphatase: 43 U/L (ref 39–117)
BUN: 14 mg/dL (ref 6–23)
CHLORIDE: 108 meq/L (ref 96–112)
CO2: 21 mEq/L (ref 19–32)
CREATININE: 0.8 mg/dL (ref 0.50–1.35)
Calcium: 9.2 mg/dL (ref 8.4–10.5)
Glucose, Bld: 90 mg/dL (ref 70–99)
Potassium: 4.2 mEq/L (ref 3.5–5.3)
Sodium: 142 mEq/L (ref 135–145)
Total Bilirubin: 0.5 mg/dL (ref 0.2–1.2)
Total Protein: 6.5 g/dL (ref 6.0–8.3)

## 2013-12-09 LAB — LIPID PANEL
CHOL/HDL RATIO: 6.3 ratio
Cholesterol: 219 mg/dL — ABNORMAL HIGH (ref 0–200)
HDL: 35 mg/dL — ABNORMAL LOW (ref >39)
LDL CALC: 137 mg/dL — AB (ref 0–99)
Triglycerides: 237 mg/dL — ABNORMAL HIGH (ref <150)
VLDL: 47 mg/dL — ABNORMAL HIGH (ref 0–40)

## 2013-12-09 LAB — PSA: PSA: 0.04 ng/mL (ref <=4.00)

## 2013-12-10 ENCOUNTER — Telehealth: Payer: Self-pay | Admitting: Radiology

## 2013-12-10 NOTE — Telephone Encounter (Signed)
Pt is calling about his lab results that have not been reviewed yet.

## 2013-12-11 ENCOUNTER — Ambulatory Visit (INDEPENDENT_AMBULATORY_CARE_PROVIDER_SITE_OTHER): Payer: Medicare Other | Admitting: Emergency Medicine

## 2013-12-11 VITALS — BP 118/82 | HR 58 | Temp 98.1°F | Resp 16 | Ht 68.75 in | Wt 209.2 lb

## 2013-12-11 DIAGNOSIS — I2510 Atherosclerotic heart disease of native coronary artery without angina pectoris: Secondary | ICD-10-CM | POA: Diagnosis not present

## 2013-12-11 DIAGNOSIS — Z7901 Long term (current) use of anticoagulants: Secondary | ICD-10-CM | POA: Diagnosis not present

## 2013-12-11 DIAGNOSIS — Z5181 Encounter for therapeutic drug level monitoring: Secondary | ICD-10-CM

## 2013-12-11 DIAGNOSIS — G589 Mononeuropathy, unspecified: Secondary | ICD-10-CM | POA: Diagnosis not present

## 2013-12-11 LAB — PROTIME-INR
INR: 2.46 — ABNORMAL HIGH (ref <1.50)
Prothrombin Time: 26.7 seconds — ABNORMAL HIGH (ref 11.6–15.2)

## 2013-12-11 NOTE — Progress Notes (Signed)
[facility] [address], [place] [address] [phone]  Date:  12/11/2013   Name:  Rodney Friedman   DOB:  10-22-1945   MRN:  [id number]  PCP:  Berkley Harvey, NP    Chief Complaint: Knee Pain and Lab   History of Present Illness:  Rodney Friedman is a 68 y.o. very pleasant male patient who presents with the following:  Has an 18 month sensory loss right thigh just proximal to knee and is anterolateral.  No associated pain or weakness  No history of injury or back pain.  Has prostate cancer diagnosed in 2014 with negative bone scan.  Denies other complaint or health concern today.   Patient Active Problem List   Diagnosis Date Noted  . CAD (coronary artery disease) 09/03/2013  . Pre-diabetes 09/03/2013  . 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 Prostate cancer 07/08/2013    Past Medical History  Diagnosis Date  . Diabetes mellitus without complication   . Leukemia     CLL  . Prostate cancer   . Cataract   . Hypertension   . 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 Prostate cancer 07/08/2013  . Anxiety   . Depression   . Heart murmur   . Myocardial infarction   . Substance abuse   . Tuberculosis     Past Surgical History  Procedure Laterality Date  . 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 Colonoscopy    . Eye surgery      History  Substance Use Topics  . Smoking status: Current Every Day Smoker -- 0.50 packs/day for 50 years  . Smokeless tobacco: Never Used  . Alcohol Use: No    Family History  Problem Relation Age of Onset  . Cancer Mother     colon cancer  . Cancer Father     prostate ca  . Hypertension Father     No Known Allergies  Medication list has been reviewed and updated.  Current Outpatient Prescriptions on File Prior to Visit  Medication Sig Dispense Refill  . amLODipine (NORVASC) 10 MG tablet Take 10 mg by mouth daily.      Marland Kitchen buPROPion (WELLBUTRIN SR) 150 MG 12 hr tablet Take 150 mg by  mouth 2 (two) times daily.      Marland Kitchen lamoTRIgine (LAMICTAL) 100 MG tablet Take 300 mg by mouth daily.      Marland Kitchen latanoprost (XALATAN) 0.005 % ophthalmic solution Once a day every third day      . Leuprolide Acetate (LUPRON DEPOT IM) Inject into the muscle every 6 (six) months.       . metFORMIN (GLUCOPHAGE) 500 MG tablet Take 500 mg by mouth 2 (two) times daily.      . metoprolol (TOPROL-XL) 200 MG 24 hr tablet Take 100 mg by mouth daily.      . pravastatin (PRAVACHOL) 80 MG tablet Take 80 mg by mouth daily.      . valsartan (DIOVAN) 320 MG tablet Take 320 mg by mouth daily.      Marland Kitchen warfarin (COUMADIN) 4 MG tablet Take 4 mg by mouth daily.       No current facility-administered medications on file prior to visit.    Review of Systems:  As per HPI, otherwise negative.    Physical Examination: Filed Vitals:   12/11/13 1100  BP: 118/82  Pulse: 58  Temp: 98.1 F (36.7 C)  Resp: 16   Filed Vitals:   12/11/13 1100  Height: 5' 8.75" (1.746 m)  Weight: 209 lb 3.2 oz (94.892 kg)   Body mass index is 31.13 kg/(m^2). Ideal Body Weight: Weight in (lb) to have BMI = 25: 167.7   GEN: WDWN, NAD, Non-toxic, Alert & Oriented x 3 HEENT: Atraumatic, Normocephalic.  Ears and Nose: No external deformity. EXTR: No clubbing/cyanosis/edema NEURO: Normal gait and motor strength. PSYCH: Normally interactive. Conversant. Not depressed or anxious appearing.  Calm demeanor.    Assessment and Plan: Mononeuropathy  Signed,  Ellison Carwin, MD

## 2013-12-11 NOTE — Patient Instructions (Signed)
Warfarin: What You Need to Know Warfarin is an anticoagulant. Anticoagulants help prevent the formation of blood clots. They also help stop the growth of blood clots. Warfarin is sometimes referred to as a "blood thinner."  Normally, when body tissues are cut or damaged, the blood clots in order to prevent blood loss. Sometimes clots form inside your blood vessels and obstruct the flow of blood through your circulatory system (thrombosis). These clots may travel through your bloodstream and become lodged in smaller blood vessels in your brain, which can cause a stroke, or in your lungs (pulmonary embolism). WHO SHOULD USE WARFARIN? Warfarin is prescribed for people at risk of developing harmful blood clots:  People with surgically implanted mechanical heart valves, irregular heart rhythms called atrial fibrillation, and certain clotting disorders.  People who have developed harmful blood clotting in the past, including those who have had a stroke or a pulmonary embolism, or thrombosis in their legs (deep vein thrombosis [DVT]).  People with an existing blood clot, such as a pulmonary embolism. WARFARIN DOSING Warfarin tablets come in different strengths. Each tablet strength is a different color, with the amount of warfarin (in milligrams) clearly printed on the tablet. If the color of your tablet is different than usual when you receive a new prescription, report it immediately to your pharmacist or health care provider. WARFARIN MONITORING The goal of warfarin therapy is to lessen the clotting tendency of blood but not prevent clotting completely. Your health care provider will monitor the anticoagulation effect of warfarin closely and adjust your dose as needed. For your safety, blood tests called prothrombin time (PT) or international normalized ratio (INR) are used to measure the effects of warfarin. Both of these tests can be done with a finger stick or a blood draw. The longer it takes the  blood to clot, the higher the PT or INR. Your health care provider will inform you of your "target" PT or INR range. If, at any time, your PT or INR is above the target range, there is a risk of bleeding. If your PT or INR is below the target range, there is a risk of clotting. Whether you are started on warfarin while you are in the hospital or in your health care provider's office, you will need to have your PT or INR checked within one week of starting the medicine. Initially, some people are asked to have their PT or INR checked as much as twice a week. Once you are on a stable maintenance dose, the PT or INR is checked less often, usually once every 2 to 4 weeks. The warfarin dose may be adjusted if the PT or INR is not within the target range. It is important to keep all laboratory and health care provider follow-up appointments. Not keeping appointments could result in a chronic or permanent injury, pain, or disability because warfarin is a medicine that requires close monitoring. WHAT ARE THE SIDE EFFECTS OF WARFARIN?  Too much warfarin can cause bleeding (hemorrhage) from any part of the body. This may include bleeding from the gums, blood in the urine, bloody or dark stools, a nosebleed that is not easily stopped, coughing up blood, or vomiting blood.  Too little warfarin can increase the risk of blood clots.  Too little or too much warfarin can also increase the risk of a stroke.  Warfarin use may cause a skin rash or irritation, an unusual fever, continual nausea or stomach upset, or severe pain in your joints or back.   SPECIAL PRECAUTIONS WHILE TAKING WARFARIN Warfarin should be taken exactly as directed. It is very important to take warfarin as directed since bleeding or blood clots could result in chronic or permanent injury, pain, or disability.  Take your medicine at the same time every day. If you forget to take your dose, you can take it if it is within 6 hours of when it was  due.  Do not change the dose of warfarin on your own to make up for missed or extra doses.  If you miss more than 2 doses in a row, you should contact your health care provider for advice. Avoid situations that cause bleeding. You may have a tendency to bleed more easily than usual while taking warfarin. The following actions can limit bleeding:  Using a softer toothbrush.  Flossing with waxed floss rather than unwaxed floss.  Shaving with an electric razor rather than a blade.  Limiting the use of sharp objects.  Avoiding potentially harmful activities, such as contact sports. Warfarin and Pregnancy or Breastfeeding  Warfarin is not advised during the first trimester of pregnancy due to an increased risk of birth defects. In certain situations, a woman may take warfarin after her first trimester of pregnancy. A woman who becomes pregnant or plans to become pregnant while taking warfarin should notify her health care provider immediately.  Although warfarin does not pass into breast milk, a woman who wishes to breastfeed while taking warfarin should also consult with her health care provider. Alcohol, Smoking, and Illicit Drug Use  Alcohol affects how warfarin works in the body. It is best to avoid alcoholic drinks or consume very small amounts while taking warfarin. In general, alcohol intake should be limited to 1 oz (30 mL) of liquor, 6 oz (180 mL) of wine, or 12 oz (360 mL) of beer each day. Notify your health care provider if you change your alcohol intake.  Smoking affects how warfarin works. It is best to avoid smoking while taking warfarin. Notify your health care provider if you change your smoking habits.  It is best to avoid all illicit drugs while taking warfarin since there are few studies that show how warfarin interacts with these drugs. Other Medicines and Dietary Supplements Many prescription and over-the-counter medicines can interfere with warfarin. Be sure all of your  health care providers know you are taking warfarin. Notify your health care provider who prescribed warfarin for you or your pharmacist before starting or stopping any new medicines, including over-the-counter vitamins, dietary supplements, and pain medicines. Your warfarin dose may need to be adjusted. Some common over-the-counter medicines that may increase the risk of bleeding while taking warfarin include:   Acetaminophen.  Aspirin.  Nonsteroidal anti-inflammatory medicines (NSAIDs), such as ibuprofen or naproxen.  Vitamin E. Dietary Considerations  Foods that have moderate or high amounts of vitamin K can interfere with warfarin. Avoid major changes in your diet or notify your health care provider before changing your diet. Eat a consistent amount of foods that have moderate or high amounts of vitamin K. Eating less foods containing vitamin K can increase the risk of bleeding. Eating more foods containing vitamin K can increase the risk of blood clots. Additional questions about dietary considerations can be discussed with a dietitian. Foods that are very high in vitamin K:  Greens, such as Swiss chard and beet, collard, mustard, or turnip greens (fresh or frozen, cooked).  Kale (fresh or frozen, cooked).  Parsley (raw).  Spinach (cooked). Foods that are high   in vitamin K:  Asparagus (frozen, cooked).  Beans, green (frozen, cooked).  Broccoli.  Bok choy (cooked).  Brussels sprouts (fresh or frozen, cooked).  Cabbage (cooked).   Coleslaw. Foods that are moderately high in vitamin K:  Blueberries.  Black-eyed peas.  Endive (raw).  Green leaf lettuce (raw).  Green scallions (raw).  Kale (raw).  Okra (frozen, cooked).  Plantains (fried).  Romaine lettuce (raw).  Sauerkraut (canned).  Spinach (raw). CALL YOUR CLINIC OR HEALTH CARE PROVIDER IF YOU:  Plan to have any surgery or procedure.  Feel sick, especially if you have diarrhea or  vomiting.  Experience or anticipate any major changes in your diet.  Start or stop a prescription or over-the-counter medicine.  Become, plan to become, or think you may be pregnant.  Are having heavier than usual menstrual periods.  Have had a fall, accident, or any symptoms of bleeding or unusual bruising.  Develop an unusual fever. CALL 911 IN THE [place] OR GO TO THE EMERGENCY DEPARTMENT IF YOU:   Think you may be having an allergic reaction to warfarin. The signs of an allergic reaction could include itching, rash, hives, swelling, chest tightness, or trouble breathing.  See signs of blood in your urine. The signs could include reddish, pinkish, or tea-colored urine.  See signs of blood in your stools. The signs could include bright red or black stools.  Vomit or cough up blood. In these instances, the blood could have either a bright red or a "coffee-grounds" appearance.  Have bleeding that will not stop after applying pressure for 30 minutes such as cuts, nosebleeds, or other injuries.  Have severe pain in your joints or back.  Have a new and severe headache.  Have sudden weakness or numbness of your face, arm, or leg, especially on one side of your body.  Have sudden confusion or trouble understanding.  Have sudden trouble seeing in one or both eyes.  Have sudden trouble walking, dizziness, loss of balance, or coordination.  Have trouble speaking or understanding (aphasia). Document Released: 05/08/2005 Document Revised: 09/22/2013 Document Reviewed: 11/01/2012 Exit[employer] Patient Information 2015 [employer]. This information is not intended to replace advice given to you by your health care provider. Make sure you discuss any questions you have with your health care provider.  

## 2013-12-30 DIAGNOSIS — Z8546 Personal history of malignant neoplasm of prostate: Secondary | ICD-10-CM | POA: Diagnosis not present

## 2013-12-31 ENCOUNTER — Ambulatory Visit (INDEPENDENT_AMBULATORY_CARE_PROVIDER_SITE_OTHER): Payer: Medicare Other | Admitting: Cardiovascular Disease

## 2013-12-31 ENCOUNTER — Ambulatory Visit (INDEPENDENT_AMBULATORY_CARE_PROVIDER_SITE_OTHER): Payer: Medicare Other | Admitting: Pharmacist Clinician (PhC)/ Clinical Pharmacy Specialist

## 2013-12-31 ENCOUNTER — Encounter: Payer: Self-pay | Admitting: Cardiovascular Disease

## 2013-12-31 VITALS — BP 134/73 | HR 62 | Resp 16 | Ht 69.0 in | Wt 214.3 lb

## 2013-12-31 DIAGNOSIS — I480 Paroxysmal atrial fibrillation: Secondary | ICD-10-CM

## 2013-12-31 DIAGNOSIS — I359 Nonrheumatic aortic valve disorder, unspecified: Secondary | ICD-10-CM

## 2013-12-31 DIAGNOSIS — E785 Hyperlipidemia, unspecified: Secondary | ICD-10-CM

## 2013-12-31 DIAGNOSIS — E669 Obesity, unspecified: Secondary | ICD-10-CM

## 2013-12-31 DIAGNOSIS — Z720 Tobacco use: Secondary | ICD-10-CM

## 2013-12-31 DIAGNOSIS — I4891 Unspecified atrial fibrillation: Secondary | ICD-10-CM | POA: Diagnosis not present

## 2013-12-31 DIAGNOSIS — F17200 Nicotine dependence, unspecified, uncomplicated: Secondary | ICD-10-CM

## 2013-12-31 DIAGNOSIS — I2510 Atherosclerotic heart disease of native coronary artery without angina pectoris: Secondary | ICD-10-CM | POA: Diagnosis not present

## 2013-12-31 DIAGNOSIS — Z7901 Long term (current) use of anticoagulants: Secondary | ICD-10-CM | POA: Diagnosis not present

## 2013-12-31 DIAGNOSIS — I482 Chronic atrial fibrillation, unspecified: Secondary | ICD-10-CM | POA: Insufficient documentation

## 2013-12-31 DIAGNOSIS — I119 Hypertensive heart disease without heart failure: Secondary | ICD-10-CM | POA: Diagnosis not present

## 2013-12-31 DIAGNOSIS — I350 Nonrheumatic aortic (valve) stenosis: Secondary | ICD-10-CM

## 2013-12-31 LAB — POCT INR: INR: 3.7

## 2013-12-31 NOTE — Patient Instructions (Signed)
Dr. Croitoru recommends that you schedule a follow-up appointment in: One year.   

## 2014-01-03 ENCOUNTER — Encounter: Payer: Self-pay | Admitting: Cardiovascular Disease

## 2014-01-03 DIAGNOSIS — E782 Mixed hyperlipidemia: Secondary | ICD-10-CM | POA: Insufficient documentation

## 2014-01-03 DIAGNOSIS — I119 Hypertensive heart disease without heart failure: Secondary | ICD-10-CM | POA: Insufficient documentation

## 2014-01-03 DIAGNOSIS — Z720 Tobacco use: Secondary | ICD-10-CM | POA: Insufficient documentation

## 2014-01-03 DIAGNOSIS — E669 Obesity, unspecified: Secondary | ICD-10-CM | POA: Insufficient documentation

## 2014-01-03 DIAGNOSIS — I350 Nonrheumatic aortic (valve) stenosis: Secondary | ICD-10-CM | POA: Insufficient documentation

## 2014-01-03 NOTE — Assessment & Plan Note (Signed)
Weight loss would be beneficial and may lead to improvement in glucose levels, so that he would no longer need metformin as well as improve triglyceride and HDL cholesterol levels.

## 2014-01-03 NOTE — Assessment & Plan Note (Signed)
This is his biggest risk factor for recurrent coronary events and death and have strongly recommended that he consider smoking cessation. At this point we started discussing his previous problems with substance abuse and it is clear that it will not be easy for him to completely quit smoking. It is well worth the effort however.

## 2014-01-03 NOTE — Assessment & Plan Note (Signed)
This does not yet appear clinically relevant

## 2014-01-03 NOTE — Assessment & Plan Note (Signed)
On appropriate anticoagulation and a high dose of beta blockers that should hopefully provide good rate control for breakthrough events.

## 2014-01-03 NOTE — Assessment & Plan Note (Signed)
His total cholesterol, triglycerides, HDL and LDL cholesterol levels are all in undesirable ranges, but it is my understanding that the labs were drawn when he had been off medication after moving from [place]. Recheck labs on statin.

## 2014-01-03 NOTE — Assessment & Plan Note (Signed)
Status post 3 vessel CABG in 2006 (records do not reports location of grafts). He has had angina only once since bypass surgery in the setting of atrial fibrillation with rapid ventricular response. He had a normal nuclear stress test roughly 2 years ago.

## 2014-01-03 NOTE — Assessment & Plan Note (Signed)
He seems to have very severe hypertension that is currently well controlled. Reminded of the risk of rebound with sudden interruption of beta blocker therapy. His electrocardiogram definitely shows evidence of left ventricular hypertrophy. Whether this is mild or moderate to severe is debatable depending on his previous echocardiograms. He has no evidence of congestive heart failure, although it is very likely that he has significant diastolic dysfunction.

## 2014-01-03 NOTE — Progress Notes (Signed)
Patient ID: Rodney Friedman, male   DOB: 01-30-1946, 68 y.o.   MRN: [id number]      Reason for office visit CAD s/p CABG  Rodney Friedman has recently moved to [place] from [place]. He has a long-standing history of coronary artery disease. He underwent three-vessel bypass surgery in 2006. His angina presentation was left antecubital pain associated with "heartburn" and anxiety. In July 2014 he had atrial fibrillation with rapid ventricular response, presenting with profuse diaphoresis and chest pain that resolved with rate control. He takes warfarin anticoagulation and has never had a stroke or transient ischemic attack.  He had a dual isotope nuclear stress test in June 2013 that showed normal perfusion pattern and an ejection fraction of 52%. By echo his ejection fraction was described as being normal but he had severe left ventricular hypertrophy on one echocardiogram with moderate biatrial dilatation and grade 1 diastolic dysfunction. Wall thickness was measured between 1.9 and 2.3 cm. (Another echocardiogram performed just a year apart only showed mild LVH, walls 1.3 cm). He has very mild aortic stenosis and some degree of anterior hypokinesis by echo.  Electrocardiogram shows typical findings of left ventricular hypertrophy  He has treated hyperlipidemia and he tells me that recent lab tests showed his levels to be "okay". He takes metformin monotherapy for mild diabetes mellitus. He has well compensated systemic hypertension on practically maximal high doses of metoprolol and valsartan and amlodipine. He does not need diuretic therapy, although she does describe occasional swelling in the ankle where his saphenectomy was performed.  He has a bilateral carotid bruits due to external carotid stenoses. There is no evidence of significant internal carotid disease on duplex ultrasound (June 2013).  He has chronic lymphocytic leukemia for over a decade and has never had anemia or required chemotherapy. He  has treated prostate cancer.  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 He continues to go to [employer]. He he smokes roughly 15 cigarettes a day. He has been diagnosed with bipolar disorder and anxiety. 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 He lives with his elderly father and has 2 children, one of them also has substance abuse problems. He exercises every day for about 50 minutes with light weights.   No Known Allergies  Current Outpatient Prescriptions  Medication Sig Dispense Refill  . acetaminophen-codeine (TYLENOL #3) 300-30 MG per tablet Take 1 tablet by mouth as needed. For dental procedures      . amLODipine (NORVASC) 10 MG tablet Take 10 mg by mouth daily.      Marland Kitchen amoxicillin (AMOXIL) 500 MG capsule Take 1 capsule by mouth as needed. For dental procedures      . buPROPion (WELLBUTRIN SR) 150 MG 12 hr tablet Take 150 mg by mouth 2 (two) times daily.      Marland Kitchen lamoTRIgine (LAMICTAL) 100 MG tablet Take 300 mg by mouth daily.      Marland Kitchen latanoprost (XALATAN) 0.005 % ophthalmic solution Once a day every third day      . Leuprolide Acetate (LUPRON DEPOT IM) Inject into the muscle every 6 (six) months.       . metFORMIN (GLUCOPHAGE) 500 MG tablet Take 500 mg by mouth 2 (two) times daily.      . metoprolol (TOPROL-XL) 200 MG 24 hr tablet Take 100 mg by mouth daily.      . pravastatin (PRAVACHOL) 80 MG tablet Take  80 mg by mouth daily.      . valsartan (DIOVAN) 320 MG tablet Take 320 mg by mouth daily.      Marland Kitchen warfarin (COUMADIN) 4 MG tablet Take 4 mg by mouth daily.       No current facility-administered medications for this visit.    Past Medical History  Diagnosis Date  . Diabetes mellitus without complication   . Leukemia     CLL  . Prostate cancer   . Cataract   . Hypertension   . 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 Prostate cancer 07/08/2013  . Anxiety   . Depression   . Heart  murmur   . Myocardial infarction   . Substance abuse   . Tuberculosis     Past Surgical History  Procedure Laterality Date  . 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 Colonoscopy    . Eye surgery      Family History  Problem Relation Age of Onset  . Cancer Mother     colon cancer  . Cancer Father     prostate ca  . Hypertension Father     History   Social History  . Marital Status: Divorced    Spouse Name: N/A    Number of Children: N/A  . Years of Education: N/A   Occupational History  . Not on file.   Social History Main Topics  . Smoking status: Current Every Day Smoker -- 0.50 packs/day for 50 years  . Smokeless tobacco: Never Used  . Alcohol Use: No  . Drug Use: No  . Sexual Activity: Not on file   Other Topics Concern  . Not on file   Social History Narrative  . No narrative on file    Review of systems: The patient specifically denies any chest pain at rest or with exertion, dyspnea at rest or with exertion, orthopnea, paroxysmal nocturnal dyspnea, syncope, palpitations, focal neurological deficits, intermittent claudication, lower extremity edema, unexplained weight gain, cough, hemoptysis or wheezing.  The patient also denies abdominal pain, nausea, vomiting, dysphagia, diarrhea, constipation, polyuria, polydipsia, dysuria, hematuria, frequency, urgency, abnormal bleeding or bruising, fever, chills, unexpected weight changes, mood swings, change in skin or hair texture, change in voice quality, auditory or visual problems, allergic reactions or rashes, new musculoskeletal complaints other than usual "aches and pains".   PHYSICAL EXAM BP 134/73  Pulse 62  Resp 16  Ht 5\' 9"  (1.753 m)  Wt 214 lb 4.8 oz (97.206 kg)  BMI 31.63 kg/m2  General: Alert, oriented x3, no distress Head: no evidence of trauma, PERRL, EOMI, no exophtalmos or lid lag, no myxedema, no xanthelasma; normal ears, nose and oropharynx Neck: normal jugular venous pulsations and no  hepatojugular reflux; brisk carotid pulses without delay. He has soft bilateral carotid bruits Chest: clear to auscultation, no signs of consolidation by percussion or palpation, normal fremitus, symmetrical and full respiratory excursions. Anatomy scar Cardiovascular: normal position and quality of the apical impulse, regular rhythm, normal first and second heart sounds, grade 1-2/6 early peaking systolic ejection murmur at aortic focus, no diastolic murmurs, no rubs or gallops Abdomen: no tenderness or distention, no masses by palpation, no abnormal pulsatility or arterial bruits, normal bowel sounds, no hepatosplenomegaly Extremities: no clubbing, cyanosis or edema; 2+ radial, ulnar and brachial pulses bilaterally; 2+ right femoral, posterior tibial and dorsalis pedis pulses; 2+ left femoral, posterior tibial and dorsalis pedis pulses; no subclavian or femoral bruits Neurological: grossly nonfocal   EKG: Sinus rhythm, left ventricular  hypertrophy with profound secondary repolarization abnormalities  Lipid Panel reportedly were performed when he was briefly off medical therapy    Component Value Date/Time   CHOL 219* 12/08/2013 1536   TRIG 237* 12/08/2013 1536   HDL 35* 12/08/2013 1536   CHOLHDL 6.3 12/08/2013 1536   VLDL 47* 12/08/2013 1536   LDLCALC 137* 12/08/2013 1536    BMET    Component Value Date/Time   NA 142 12/08/2013 1536   K 4.2 12/08/2013 1536   CL 108 12/08/2013 1536   CO2 21 12/08/2013 1536   GLUCOSE 90 12/08/2013 1536   BUN 14 12/08/2013 1536   CREATININE 0.80 12/08/2013 1536   CREATININE 0.84 07/08/2013 1045   CALCIUM 9.2 12/08/2013 1536     ASSESSMENT AND PLAN No problem-specific assessment & plan notes found for this encounter.  Patient Instructions  Dr. Sallyanne Kuster recommends that you schedule a follow-up appointment in: One year.      No orders of the defined types were placed in this encounter.   Meds ordered this encounter  Medications  . acetaminophen-codeine  (TYLENOL #3) 300-30 MG per tablet    Sig: Take 1 tablet by mouth as needed. For dental procedures  . amoxicillin (AMOXIL) 500 MG capsule    Sig: Take 1 capsule by mouth as needed. For dental procedures    Navpreet Szczygiel  Sanda Klein, MD, [facility] [phone] office [phone] pager

## 2014-01-05 ENCOUNTER — Encounter: Payer: Self-pay | Admitting: Hematology and Oncology

## 2014-01-05 ENCOUNTER — Other Ambulatory Visit (HOSPITAL_BASED_OUTPATIENT_CLINIC_OR_DEPARTMENT_OTHER): Payer: Medicare Other

## 2014-01-05 ENCOUNTER — Ambulatory Visit (HOSPITAL_BASED_OUTPATIENT_CLINIC_OR_DEPARTMENT_OTHER): Payer: Medicare Other | Admitting: Hematology and Oncology

## 2014-01-05 VITALS — BP 138/68 | HR 61 | Temp 98.1°F | Resp 18 | Ht 69.0 in | Wt 213.8 lb

## 2014-01-05 DIAGNOSIS — F17200 Nicotine dependence, unspecified, uncomplicated: Secondary | ICD-10-CM | POA: Diagnosis not present

## 2014-01-05 DIAGNOSIS — R319 Hematuria, unspecified: Secondary | ICD-10-CM

## 2014-01-05 DIAGNOSIS — C61 Malignant neoplasm of prostate: Secondary | ICD-10-CM

## 2014-01-05 DIAGNOSIS — L989 Disorder of the skin and subcutaneous tissue, unspecified: Secondary | ICD-10-CM

## 2014-01-05 DIAGNOSIS — C9110 Chronic lymphocytic leukemia of B-cell type not having achieved remission: Secondary | ICD-10-CM | POA: Diagnosis not present

## 2014-01-05 DIAGNOSIS — Z720 Tobacco use: Secondary | ICD-10-CM

## 2014-01-05 LAB — CBC WITH DIFFERENTIAL/PLATELET
BASO%: 0.7 % (ref 0.0–2.0)
Basophils Absolute: 0.1 10*3/uL (ref 0.0–0.1)
EOS ABS: 0.3 10*3/uL (ref 0.0–0.5)
EOS%: 1.8 % (ref 0.0–7.0)
HCT: 41.6 % (ref 38.4–49.9)
HGB: 14.1 g/dL (ref 13.0–17.1)
LYMPH#: 8.3 10*3/uL — AB (ref 0.9–3.3)
LYMPH%: 55.5 % — ABNORMAL HIGH (ref 14.0–49.0)
MCH: 30.8 pg (ref 27.2–33.4)
MCHC: 34 g/dL (ref 32.0–36.0)
MCV: 90.6 fL (ref 79.3–98.0)
MONO#: 1.1 10*3/uL — ABNORMAL HIGH (ref 0.1–0.9)
MONO%: 7.3 % (ref 0.0–14.0)
NEUT#: 5.2 10*3/uL (ref 1.5–6.5)
NEUT%: 34.7 % — AB (ref 39.0–75.0)
Platelets: 218 10*3/uL (ref 140–400)
RBC: 4.59 10*6/uL (ref 4.20–5.82)
RDW: 13.5 % (ref 11.0–14.6)
WBC: 15 10*3/uL — AB (ref 4.0–10.3)

## 2014-01-05 LAB — TECHNOLOGIST REVIEW

## 2014-01-05 NOTE — Progress Notes (Signed)
[place] OFFICE PROGRESS NOTE  Patient Care Team: Darreld Mclean, MD as PCP - General (Family Medicine) Claybon Jabs, MD as Consulting Physician (Urology) Sanda Klein, MD as Consulting Physician (Cardiology) Zebedee Iba, MD as Consulting Physician (Ophthalmology)  SUMMARY OF ONCOLOGIC HISTORY: Oncology History   Prostate cancer   Primary site: Prostate (Left)   Staging method: AJCC 7th Edition   Clinical: Stage IIB (T1c, N0, M0) signed by Heath Lark, MD on 07/09/2013 10:04 AM   Summary: Stage IIB (T1c, N0, M0)       Prostate cancer   07/12/2012 Tumor Marker PSA was high at 9.6   09/30/2012 Procedure He underwent prostate biopsy at confirm diagnosis of prostate cancer.   11/11/2012 - 01/09/2013 Radiation Therapy The patient completed radiation therapy to his prostate using IM RT technique    INTERVAL HISTORY: Please see below for problem oriented charting. The patient complained of mild hematuria recently. Denies any dysuria. He denies recent infection. Denies new palpable lymphadenopathy. He had numerous skin lesions removed by a dermatologist before. He denies new skin lesions. REVIEW OF SYSTEMS:   Constitutional: Denies fevers, chills or abnormal weight loss Eyes: Denies blurriness of vision Ears, nose, mouth, throat, and face: Denies mucositis or sore throat Respiratory: Denies cough, dyspnea or wheezes Cardiovascular: Denies palpitation, chest discomfort or lower extremity swelling Gastrointestinal:  Denies nausea, heartburn or change in bowel habits Skin: Denies abnormal skin rashes Lymphatics: Denies new lymphadenopathy or easy bruising Neurological:Denies numbness, tingling or new weaknesses Behavioral/Psych: Mood is stable, no new changes  All other systems were reviewed with the patient and are negative.  I have reviewed the past medical history, past surgical history, social history and family history with the patient and they are  unchanged from previous note.  ALLERGIES:  has No Known Allergies.  MEDICATIONS:  Current Outpatient Prescriptions  Medication Sig Dispense Refill  . amLODipine (NORVASC) 10 MG tablet Take 10 mg by mouth daily.      Marland Kitchen buPROPion (WELLBUTRIN SR) 150 MG 12 hr tablet Take 150 mg by mouth 2 (two) times daily.      Marland Kitchen lamoTRIgine (LAMICTAL) 100 MG tablet Take 300 mg by mouth daily.      Marland Kitchen latanoprost (XALATAN) 0.005 % ophthalmic solution Once a day every third day      . Leuprolide Acetate (LUPRON DEPOT IM) Inject into the muscle every 6 (six) months.       . metFORMIN (GLUCOPHAGE) 500 MG tablet Take 500 mg by mouth 2 (two) times daily.      . metoprolol (TOPROL-XL) 200 MG 24 hr tablet Take 100 mg by mouth daily.      . pravastatin (PRAVACHOL) 80 MG tablet Take 80 mg by mouth daily.      . valsartan (DIOVAN) 320 MG tablet Take 320 mg by mouth daily.      Marland Kitchen warfarin (COUMADIN) 4 MG tablet Take 4 mg by mouth daily.       No current facility-administered medications for this visit.    PHYSICAL EXAMINATION: ECOG PERFORMANCE STATUS: 0 - Asymptomatic  Filed Vitals:   01/05/14 0823  BP: 138/68  Pulse: 61  Temp: 98.1 F (36.7 C)  Resp: 18   Filed Weights   01/05/14 0823  Weight: 213 lb 12.8 oz (96.979 kg)    GENERAL:alert, no distress and comfortable SKIN: skin color, texture, turgor are normal, no rashes or significant lesions. Noticed significant solar keratosis. EYES: normal, Conjunctiva are pink and non-injected, sclera  clear OROPHARYNX:no exudate, no erythema and lips, buccal mucosa, and tongue normal  NECK: supple, thyroid normal size, non-tender, without nodularity LYMPH:  Palpable lymphadenopathy in the neck and axilla. The are very small.  LUNGS: clear to auscultation and percussion with normal breathing effort HEART: regular rate & rhythm and no murmurs and no lower extremity edema ABDOMEN:abdomen soft, non-tender and normal bowel sounds Musculoskeletal:no cyanosis of digits  and no clubbing  NEURO: alert & oriented x 3 with fluent speech, no focal motor/sensory deficits  LABORATORY DATA:  I have reviewed the data as listed    Component Value Date/Time   NA 142 12/08/2013 1536   K 4.2 12/08/2013 1536   CL 108 12/08/2013 1536   CO2 21 12/08/2013 1536   GLUCOSE 90 12/08/2013 1536   BUN 14 12/08/2013 1536   CREATININE 0.80 12/08/2013 1536   CREATININE 0.84 07/08/2013 1045   CALCIUM 9.2 12/08/2013 1536   PROT 6.5 12/08/2013 1536   ALBUMIN 4.2 12/08/2013 1536   AST 15 12/08/2013 1536   ALT 15 12/08/2013 1536   ALKPHOS 43 12/08/2013 1536   BILITOT 0.5 12/08/2013 1536    No results found for this basename: SPEP, UPEP,  kappa and lambda light chains    Lab Results  Component Value Date   WBC 15.0* 01/05/2014   NEUTROABS 5.2 01/05/2014   HGB 14.1 01/05/2014   HCT 41.6 01/05/2014   MCV 90.6 01/05/2014   PLT 218 01/05/2014      Chemistry      Component Value Date/Time   NA 142 12/08/2013 1536   K 4.2 12/08/2013 1536   CL 108 12/08/2013 1536   CO2 21 12/08/2013 1536   BUN 14 12/08/2013 1536   CREATININE 0.80 12/08/2013 1536   CREATININE 0.84 07/08/2013 1045      Component Value Date/Time   CALCIUM 9.2 12/08/2013 1536   ALKPHOS 43 12/08/2013 1536   AST 15 12/08/2013 1536   ALT 15 12/08/2013 1536   BILITOT 0.5 12/08/2013 1536      ASSESSMENT & PLAN:  CLL (chronic lymphocytic leukemia) Clinically, her white blood cell count is stable. He has small palpable lymphadenopathy in the neck and axilla. I recommend observation only and see him back on a yearly basis with history, physical examination and blood work.  Prostate cancer I would defer treatment to his urologist.  Tobacco abuse I spent some time counseling the patient the importance of tobacco cessation. he is currently attempting to quit on his own  I gave him patient education handout and encouraged him to sign up for smoking cessation class. The patient has made a commitment with me that he will try to cut  down to 6 cigarettes per day or less by the time I see him next year.  Hematuria I suspect this could be due to prior radiation exposure of his bladder in addition to mild side effects from anticoagulation therapy. He is due to see his urologist tomorrow and I recommend he discuss this with his urologist for further management. I recommend he increase oral fluid intake.  Skin lesion There is nothing suspicious for melanoma. However, he is at risk of skin cancer especially with diagnosis of CLL. I recommend he makes appointment to see his dermatologist soon.     Orders Placed This Encounter  Procedures  . CBC with Differential    Standing Status: Future     Number of Occurrences:      Standing Expiration Date: 02/09/2015  . Comprehensive metabolic  panel    Standing Status: Future     Number of Occurrences:      Standing Expiration Date: 02/09/2015  . Lactate dehydrogenase    Standing Status: Future     Number of Occurrences:      Standing Expiration Date: 02/09/2015   All questions were answered. The patient knows to call the clinic with any problems, questions or concerns. No barriers to learning was detected. I spent 20 minutes counseling the patient face to face. The total time spent in the appointment was 25 minutes and more than 50% was on counseling and review of test results     [facility], [place], MD 01/05/2014 9:45 AM

## 2014-01-05 NOTE — Assessment & Plan Note (Signed)
I suspect this could be due to prior radiation exposure of his bladder in addition to mild side effects from anticoagulation therapy. He is due to see his urologist tomorrow and I recommend he discuss this with his urologist for further management. I recommend he increase oral fluid intake.

## 2014-01-05 NOTE — Assessment & Plan Note (Signed)
There is nothing suspicious for melanoma. However, he is at risk of skin cancer especially with diagnosis of CLL. I recommend he makes appointment to see his dermatologist soon.

## 2014-01-05 NOTE — Assessment & Plan Note (Signed)
Clinically, her white blood cell count is stable. He has small palpable lymphadenopathy in the neck and axilla. I recommend observation only and see him back on a yearly basis with history, physical examination and blood work.

## 2014-01-05 NOTE — Assessment & Plan Note (Signed)
I would defer treatment to his urologist.

## 2014-01-05 NOTE — Assessment & Plan Note (Signed)
I spent some time counseling the patient the importance of tobacco cessation. he is currently attempting to quit on his own  I gave him patient education handout and encouraged him to sign up for smoking cessation class. The patient has made a commitment with me that he will try to cut down to 6 cigarettes per day or less by the time I see him next year.

## 2014-01-06 ENCOUNTER — Telehealth: Payer: Self-pay | Admitting: Hematology and Oncology

## 2014-01-06 DIAGNOSIS — Z8546 Personal history of malignant neoplasm of prostate: Secondary | ICD-10-CM | POA: Diagnosis not present

## 2014-01-06 NOTE — Telephone Encounter (Signed)
lvm for pt regarding to Aug 2016 appt...mailed pt appt sched/avs and letter

## 2014-01-09 ENCOUNTER — Ambulatory Visit (INDEPENDENT_AMBULATORY_CARE_PROVIDER_SITE_OTHER): Payer: Medicare Other | Admitting: Neurology

## 2014-01-09 ENCOUNTER — Ambulatory Visit (INDEPENDENT_AMBULATORY_CARE_PROVIDER_SITE_OTHER): Payer: Self-pay

## 2014-01-09 DIAGNOSIS — Z0289 Encounter for other administrative examinations: Secondary | ICD-10-CM

## 2014-01-09 DIAGNOSIS — R209 Unspecified disturbances of skin sensation: Secondary | ICD-10-CM | POA: Diagnosis not present

## 2014-01-09 NOTE — Procedures (Signed)
     HISTORY:  Rodney Friedman is a 68 year old gentleman with a history of diabetes, prostate cancer, hypertension, and coronary artery disease who has noted some numbness in the distal lateral right thigh over the last 18 months. The denies any pain or weakness in the right leg. He denies any back pain. He is being evaluated for possible neuropathy or a lumbosacral radiculopathy.  NERVE CONDUCTION STUDIES:  Nerve conduction studies were performed on both lower extremities. The distal motor latencies and motor amplitudes for the peroneal and posterior tibial nerves were within normal limits. The nerve conduction velocities for these nerves were also normal. The H reflex latencies were normal. The sensory latencies for the peroneal and saphenous nerves were within normal limits.   EMG STUDIES:  EMG study was performed on the right lower extremity:  The tibialis anterior muscle reveals 2 to 5K motor units with minimally decreased recruitment. No fibrillations or positive waves were seen. The peroneus tertius muscle reveals 2 to 6K motor units with decreased recruitment. No fibrillations or positive waves were seen. The medial gastrocnemius muscle reveals 2 to 4K motor units with minimally decreased recruitment. No fibrillations or positive waves were seen. The vastus lateralis muscle reveals 2 to 4K motor units with full recruitment. No fibrillations or positive waves were seen. The iliopsoas muscle reveals 2 to 4K motor units with full recruitment. No fibrillations or positive waves were seen. The biceps femoris muscle (long head) reveals 2 to 4K motor units with full recruitment. No fibrillations or positive waves were seen. The lumbosacral paraspinal muscles were tested at 3 levels, and revealed no abnormalities of insertional activity at all 3 levels tested. There was fair relaxation.   IMPRESSION:  Nerve conduction studies done on both lower extremities were unremarkable, without clear  evidence of a peripheral neuropathy. EMG evaluation of the right lower extremity shows some mild chronic stable signs of denervation distally. Cannot exclude a very mild stable overlying S1 radiculopathy. Clinical correlation is required. The sensory pattern of alteration described by this patient could potentially represent a distal neuropathy involving the lateral femoral cutaneous nerve in the thigh.  Jill Alexanders MD 01/09/2014 12:44 PM  [facility] [address] [place], [place] [address]  Phone [phone] Fax [phone]

## 2014-01-14 DIAGNOSIS — H31019 Macula scars of posterior pole (postinflammatory) (post-traumatic), unspecified eye: Secondary | ICD-10-CM | POA: Diagnosis not present

## 2014-01-14 DIAGNOSIS — H2510 Age-related nuclear cataract, unspecified eye: Secondary | ICD-10-CM | POA: Diagnosis not present

## 2014-01-14 DIAGNOSIS — H02839 Dermatochalasis of unspecified eye, unspecified eyelid: Secondary | ICD-10-CM | POA: Diagnosis not present

## 2014-01-14 DIAGNOSIS — H409 Unspecified glaucoma: Secondary | ICD-10-CM | POA: Diagnosis not present

## 2014-01-14 DIAGNOSIS — H4011X0 Primary open-angle glaucoma, stage unspecified: Secondary | ICD-10-CM | POA: Diagnosis not present

## 2014-01-16 ENCOUNTER — Telehealth: Payer: Self-pay | Admitting: Cardiovascular Disease

## 2014-01-16 NOTE — Telephone Encounter (Signed)
Pt was here last week and want to see his office notes.It is not in my-charts,he wants it made available so he can print it please.

## 2014-01-16 NOTE — Telephone Encounter (Signed)
Informed patient RN does not know how to release office visit note to MyChart, but could mail note. He requested that note be emailed to him at [email or web address]

## 2014-01-19 ENCOUNTER — Ambulatory Visit (INDEPENDENT_AMBULATORY_CARE_PROVIDER_SITE_OTHER): Payer: Medicare Other | Admitting: Pharmacist Clinician (PhC)/ Clinical Pharmacy Specialist

## 2014-01-19 DIAGNOSIS — Z7901 Long term (current) use of anticoagulants: Secondary | ICD-10-CM | POA: Diagnosis not present

## 2014-01-19 DIAGNOSIS — I480 Paroxysmal atrial fibrillation: Secondary | ICD-10-CM

## 2014-01-19 DIAGNOSIS — I4891 Unspecified atrial fibrillation: Secondary | ICD-10-CM | POA: Diagnosis not present

## 2014-01-19 LAB — POCT INR: INR: 2.9

## 2014-02-10 DIAGNOSIS — C61 Malignant neoplasm of prostate: Secondary | ICD-10-CM | POA: Diagnosis not present

## 2014-02-16 ENCOUNTER — Ambulatory Visit (INDEPENDENT_AMBULATORY_CARE_PROVIDER_SITE_OTHER): Payer: Medicare Other | Admitting: Pharmacist Clinician (PhC)/ Clinical Pharmacy Specialist

## 2014-02-16 DIAGNOSIS — Z7901 Long term (current) use of anticoagulants: Secondary | ICD-10-CM | POA: Diagnosis not present

## 2014-02-16 DIAGNOSIS — I480 Paroxysmal atrial fibrillation: Secondary | ICD-10-CM

## 2014-02-16 DIAGNOSIS — I4891 Unspecified atrial fibrillation: Secondary | ICD-10-CM | POA: Diagnosis not present

## 2014-02-16 LAB — POCT INR: INR: 2.6

## 2014-02-19 DIAGNOSIS — H02831 Dermatochalasis of right upper eyelid: Secondary | ICD-10-CM | POA: Diagnosis not present

## 2014-02-19 DIAGNOSIS — F17210 Nicotine dependence, cigarettes, uncomplicated: Secondary | ICD-10-CM | POA: Diagnosis not present

## 2014-02-19 DIAGNOSIS — H02834 Dermatochalasis of left upper eyelid: Secondary | ICD-10-CM | POA: Diagnosis not present

## 2014-02-19 DIAGNOSIS — I10 Essential (primary) hypertension: Secondary | ICD-10-CM | POA: Diagnosis not present

## 2014-02-19 DIAGNOSIS — H3540 Unspecified peripheral retinal degeneration: Secondary | ICD-10-CM | POA: Diagnosis not present

## 2014-02-19 DIAGNOSIS — H31011 Macula scars of posterior pole (postinflammatory) (post-traumatic), right eye: Secondary | ICD-10-CM | POA: Diagnosis not present

## 2014-02-19 DIAGNOSIS — E11359 Type 2 diabetes mellitus with proliferative diabetic retinopathy without macular edema: Secondary | ICD-10-CM | POA: Diagnosis not present

## 2014-02-19 DIAGNOSIS — H2513 Age-related nuclear cataract, bilateral: Secondary | ICD-10-CM | POA: Diagnosis not present

## 2014-02-19 DIAGNOSIS — H4011X4 Primary open-angle glaucoma, indeterminate stage: Secondary | ICD-10-CM | POA: Diagnosis not present

## 2014-02-19 DIAGNOSIS — I739 Peripheral vascular disease, unspecified: Secondary | ICD-10-CM | POA: Diagnosis not present

## 2014-02-23 DIAGNOSIS — Z23 Encounter for immunization: Secondary | ICD-10-CM | POA: Diagnosis not present

## 2014-02-25 ENCOUNTER — Other Ambulatory Visit: Payer: Self-pay | Admitting: Family Medicine

## 2014-02-25 DIAGNOSIS — I10 Essential (primary) hypertension: Secondary | ICD-10-CM

## 2014-02-25 DIAGNOSIS — E7800 Pure hypercholesterolemia, unspecified: Secondary | ICD-10-CM

## 2014-02-25 DIAGNOSIS — F32A Depression, unspecified: Secondary | ICD-10-CM

## 2014-02-25 DIAGNOSIS — F329 Major depressive disorder, single episode, unspecified: Secondary | ICD-10-CM

## 2014-02-25 DIAGNOSIS — H35719 Central serous chorioretinopathy, unspecified eye: Secondary | ICD-10-CM | POA: Insufficient documentation

## 2014-02-25 NOTE — Telephone Encounter (Signed)
Dr Lorelei Pont, I don't believe we have ever Rxd anything for this pt, but you saw him in April to est care. Do you want to give any RFs on these? Pended all for review, with note needs ov.

## 2014-03-04 ENCOUNTER — Ambulatory Visit (INDEPENDENT_AMBULATORY_CARE_PROVIDER_SITE_OTHER): Payer: Medicare Other | Admitting: Family Medicine

## 2014-03-04 VITALS — BP 142/80 | HR 50 | Temp 98.7°F | Resp 16 | Ht 69.0 in | Wt 220.0 lb

## 2014-03-04 DIAGNOSIS — R7309 Other abnormal glucose: Secondary | ICD-10-CM | POA: Diagnosis not present

## 2014-03-04 DIAGNOSIS — I2510 Atherosclerotic heart disease of native coronary artery without angina pectoris: Secondary | ICD-10-CM | POA: Diagnosis not present

## 2014-03-04 DIAGNOSIS — C9110 Chronic lymphocytic leukemia of B-cell type not having achieved remission: Secondary | ICD-10-CM | POA: Diagnosis not present

## 2014-03-04 DIAGNOSIS — R7303 Prediabetes: Secondary | ICD-10-CM

## 2014-03-04 NOTE — Patient Instructions (Signed)
Let me know if you got the Prevnar 13 or Pneumovax 23 when you got your pneumonia shot.   I am happy to refill your medications when you need them  Your most recent A1c continues to be in the pre- diabetic range.

## 2014-03-04 NOTE — Progress Notes (Signed)
[facility] [address] [place] [address] [phone]  Date:  03/04/2014   Name:  Rodney Friedman   DOB:  1946/01/14   MRN:  [id number]  PCP:  Lamar Blinks, MD    Chief Complaint: Follow up   History of Present Illness:  Rodney Friedman is a 68 y.o. very pleasant male patient who presents with the following:  He is here today to recheck- last seen by myself in April.  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  His work- up per Dr. Jannifer Franklin was negative.  "we have decided to just ignore the numbness" in part of his right leg.   He is doing well from a CLL standpoint- plans to see his hematologist Dr. Alvy Bimler next year, recently had a reassuring check-up.   From a cardiology standpoint he is also doing well- they are checking his coumadin monthly.   Dr. Karsten Ro is folling his urology issus and doing his lupron  He just got a flu shot and pneumonia vaccine at the drug store.   Pulse Readings from Last 3 Encounters:  03/04/14 50  01/05/14 61  12/31/13 62      Lab Results  Component Value Date   HGBA1C 5.8 12/08/2013    Patient Active Problem List   Diagnosis Date Noted  . Hematuria 01/05/2014  . Skin lesion 01/05/2014  . Mild aortic valve stenosis 01/03/2014  . Hypertensive heart disease 01/03/2014  . Hyperlipidemia 01/03/2014  . Mild obesity 01/03/2014  . Tobacco abuse 01/03/2014  . Atrial fibrillation 12/31/2013  . Long term (current) use of anticoagulants 12/31/2013  . CAD (coronary artery disease) 09/03/2013  . Pre-diabetes 09/03/2013  . 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 Prostate cancer 07/08/2013    Past Medical History  Diagnosis Date  . Diabetes mellitus without complication   . Leukemia     CLL  . Prostate cancer   . Cataract   . Hypertension   . 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 Prostate cancer 07/08/2013  . Anxiety   . Depression   . Heart murmur   . Myocardial  infarction   . Substance abuse   . Tuberculosis     Past Surgical History  Procedure Laterality Date  . 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 Colonoscopy    . Eye surgery      History  Substance Use Topics  . Smoking status: Current Every Day Smoker -- 0.50 packs/day for 50 years  . Smokeless tobacco: Never Used  . Alcohol Use: No    Family History  Problem Relation Age of Onset  . Cancer Mother     colon cancer  . Cancer Father     prostate ca  . Hypertension Father     No Known Allergies  Medication list has been reviewed and updated.  Current Outpatient Prescriptions on File Prior to Visit  Medication Sig Dispense Refill  . amLODipine (NORVASC) 10 MG tablet Take 10 mg by mouth daily.      Marland Kitchen buPROPion (WELLBUTRIN SR) 150 MG 12 hr tablet Take 150 mg by mouth 2 (two) times daily.      Marland Kitchen lamoTRIgine (LAMICTAL) 100 MG tablet Take 1 tablet (100 mg total) by mouth 3 (three) times daily. PATIENT NEEDS OFFICE VISIT FOR ADDITIONAL REFILLS  90 tablet  1  . latanoprost (XALATAN) 0.005 % ophthalmic solution Once a day every third day      . Leuprolide Acetate (  LUPRON DEPOT IM) Inject into the muscle every 6 (six) months.       . metFORMIN (GLUCOPHAGE) 500 MG tablet Take 500 mg by mouth 2 (two) times daily.      . metoprolol (TOPROL-XL) 200 MG 24 hr tablet Take 100 mg by mouth daily.      . pravastatin (PRAVACHOL) 80 MG tablet Take 1 tablet (80 mg total) by mouth daily. PATIENT NEEDS OFFICE VISIT FOR ADDITIONAL REFILLS  30 tablet  1  . valsartan (DIOVAN) 320 MG tablet Take 1 tablet (320 mg total) by mouth daily. PATIENT NEEDS OFFICE VISIT FOR ADDITIONAL REFILLS  30 tablet  1  . warfarin (COUMADIN) 4 MG tablet Take 4 mg by mouth daily.       No current facility-administered medications on file prior to visit.    Review of Systems:  As per HPI- otherwise negative.   Physical Examination: Filed Vitals:   03/04/14 1115  BP: 142/80  Pulse: 50  Temp: 98.7 F (37.1 C)   Resp: 16   Filed Vitals:   03/04/14 1115  Height: 5\' 9"  (1.753 m)  Weight: 220 lb (99.791 kg)   Body mass index is 32.47 kg/(m^2). Ideal Body Weight: Weight in (lb) to have BMI = 25: 168.9  GEN: WDWN, NAD, Non-toxic, A & O x 3, overweight, looks well HEENT: Atraumatic, Normocephalic. Neck supple. No masses, No LAD. Ears and Nose: No external deformity. CV: RRR, No M/G/R. No JVD. No thrill. No extra heart sounds. PULM: CTA B, no wheezes, crackles, rhonchi. No retractions. No resp. distress. No accessory muscle use. NEURO Normal gait.  PSYCH: Normally interactive. Conversant. Not depressed or anxious appearing.  Calm demeanor.    Assessment and Plan: Pre-diabetes  CLL (chronic lymphocytic leukemia)  Labs are UTD, he is following up with all the appropriate specialists.  He may request refills with me as needed.  He will try to find out if he got the pneumovax or prevnar for our records   Signed Lamar Blinks, MD

## 2014-03-05 ENCOUNTER — Encounter: Payer: Self-pay | Admitting: Family Medicine

## 2014-03-16 ENCOUNTER — Ambulatory Visit (INDEPENDENT_AMBULATORY_CARE_PROVIDER_SITE_OTHER): Payer: Medicare Other | Admitting: Pharmacist Clinician (PhC)/ Clinical Pharmacy Specialist

## 2014-03-16 DIAGNOSIS — I480 Paroxysmal atrial fibrillation: Secondary | ICD-10-CM | POA: Diagnosis not present

## 2014-03-16 DIAGNOSIS — Z7901 Long term (current) use of anticoagulants: Secondary | ICD-10-CM

## 2014-03-16 LAB — POCT INR: INR: 2.4

## 2014-03-30 ENCOUNTER — Other Ambulatory Visit: Payer: Self-pay | Admitting: Family Medicine

## 2014-04-01 ENCOUNTER — Other Ambulatory Visit: Payer: Self-pay | Admitting: Family Medicine

## 2014-04-02 ENCOUNTER — Telehealth: Payer: Self-pay

## 2014-04-02 ENCOUNTER — Telehealth: Payer: Self-pay | Admitting: Cardiovascular Disease

## 2014-04-02 MED ORDER — WARFARIN SODIUM 4 MG PO TABS: 4.0000 mg | ORAL_TABLET | Freq: Every day | ORAL | Status: DC

## 2014-04-02 NOTE — Telephone Encounter (Signed)
Pt is out of his Coumadin. Would you please call it in today to [phone] He needs Coumadin 4 mg #90 and refills.

## 2014-04-02 NOTE — Telephone Encounter (Signed)
Pt advised to get coumadin from Cardiologist or the coumadin clinic. He is calling them now. He is not out of his medication but needs a refill soon.

## 2014-04-02 NOTE — Telephone Encounter (Signed)
° ° °  Patient called to state the pharmacy has not had a response from [facility] for his request of refill on warfarin (COUMADIN) 4 MG tablet.  Patient is going to call pharmacy and ask they send an electronic request.  [phone] (H)

## 2014-04-03 ENCOUNTER — Telehealth: Payer: Self-pay | Admitting: Family Medicine

## 2014-04-06 NOTE — Telephone Encounter (Signed)
PT CAME BY - PHARMACY REQUESTED THIS REFILL ON Friday.  HE TOOK HIS LAST ONE TODAY.  HE SAYS THIS IS THE KIND OF MEDICINE THAT IF YOU DONT TAKE IT YOU DIE.  PLEASE CALL [phone]

## 2014-04-06 NOTE — Telephone Encounter (Signed)
Sent in refill. Pt notified.

## 2014-04-13 ENCOUNTER — Ambulatory Visit (INDEPENDENT_AMBULATORY_CARE_PROVIDER_SITE_OTHER): Payer: Medicare Other | Admitting: Pharmacist Clinician (PhC)/ Clinical Pharmacy Specialist

## 2014-04-13 DIAGNOSIS — Z7901 Long term (current) use of anticoagulants: Secondary | ICD-10-CM | POA: Diagnosis not present

## 2014-04-13 DIAGNOSIS — I480 Paroxysmal atrial fibrillation: Secondary | ICD-10-CM | POA: Diagnosis not present

## 2014-04-13 LAB — POCT INR: INR: 2.1

## 2014-04-23 ENCOUNTER — Other Ambulatory Visit: Payer: Self-pay | Admitting: Family Medicine

## 2014-04-29 DIAGNOSIS — L259 Unspecified contact dermatitis, unspecified cause: Secondary | ICD-10-CM | POA: Insufficient documentation

## 2014-05-01 DIAGNOSIS — L259 Unspecified contact dermatitis, unspecified cause: Secondary | ICD-10-CM | POA: Diagnosis not present

## 2014-05-06 DIAGNOSIS — L259 Unspecified contact dermatitis, unspecified cause: Secondary | ICD-10-CM | POA: Diagnosis not present

## 2014-05-08 ENCOUNTER — Other Ambulatory Visit: Payer: Self-pay | Admitting: Family Medicine

## 2014-05-21 ENCOUNTER — Other Ambulatory Visit: Payer: Self-pay | Admitting: Physician Assistant

## 2014-05-25 ENCOUNTER — Ambulatory Visit (INDEPENDENT_AMBULATORY_CARE_PROVIDER_SITE_OTHER): Payer: Medicare Other | Admitting: Pharmacist Clinician (PhC)/ Clinical Pharmacy Specialist

## 2014-05-25 DIAGNOSIS — Z7901 Long term (current) use of anticoagulants: Secondary | ICD-10-CM | POA: Diagnosis not present

## 2014-05-25 DIAGNOSIS — I480 Paroxysmal atrial fibrillation: Secondary | ICD-10-CM

## 2014-05-25 LAB — POCT INR: INR: 1.8

## 2014-06-04 ENCOUNTER — Ambulatory Visit (INDEPENDENT_AMBULATORY_CARE_PROVIDER_SITE_OTHER): Payer: Medicare Other

## 2014-06-04 ENCOUNTER — Ambulatory Visit (INDEPENDENT_AMBULATORY_CARE_PROVIDER_SITE_OTHER): Payer: Medicare Other | Admitting: Family Medicine

## 2014-06-04 ENCOUNTER — Encounter: Payer: Self-pay | Admitting: Family Medicine

## 2014-06-04 VITALS — BP 160/80 | HR 73 | Temp 98.4°F

## 2014-06-04 DIAGNOSIS — R0602 Shortness of breath: Secondary | ICD-10-CM | POA: Diagnosis not present

## 2014-06-04 DIAGNOSIS — M5430 Sciatica, unspecified side: Secondary | ICD-10-CM | POA: Diagnosis not present

## 2014-06-04 DIAGNOSIS — I350 Nonrheumatic aortic (valve) stenosis: Secondary | ICD-10-CM | POA: Diagnosis not present

## 2014-06-04 DIAGNOSIS — I25709 Atherosclerosis of coronary artery bypass graft(s), unspecified, with unspecified angina pectoris: Secondary | ICD-10-CM | POA: Diagnosis not present

## 2014-06-04 DIAGNOSIS — I119 Hypertensive heart disease without heart failure: Secondary | ICD-10-CM | POA: Diagnosis not present

## 2014-06-04 DIAGNOSIS — C9110 Chronic lymphocytic leukemia of B-cell type not having achieved remission: Secondary | ICD-10-CM | POA: Diagnosis not present

## 2014-06-04 DIAGNOSIS — R7309 Other abnormal glucose: Secondary | ICD-10-CM

## 2014-06-04 DIAGNOSIS — M545 Low back pain: Secondary | ICD-10-CM

## 2014-06-04 DIAGNOSIS — M5440 Lumbago with sciatica, unspecified side: Secondary | ICD-10-CM

## 2014-06-04 DIAGNOSIS — L237 Allergic contact dermatitis due to plants, except food: Secondary | ICD-10-CM

## 2014-06-04 DIAGNOSIS — R109 Unspecified abdominal pain: Secondary | ICD-10-CM

## 2014-06-04 DIAGNOSIS — R7303 Prediabetes: Secondary | ICD-10-CM

## 2014-06-04 LAB — POCT UA - MICROSCOPIC ONLY
BACTERIA, U MICROSCOPIC: NEGATIVE
CASTS, UR, LPF, POC: NEGATIVE
Crystals, Ur, HPF, POC: NEGATIVE
Mucus, UA: NEGATIVE
Yeast, UA: NEGATIVE

## 2014-06-04 LAB — POCT URINALYSIS DIPSTICK
BILIRUBIN UA: NEGATIVE
Glucose, UA: NEGATIVE
Ketones, UA: NEGATIVE
Leukocytes, UA: NEGATIVE
Nitrite, UA: NEGATIVE
PH UA: 5
Protein, UA: NEGATIVE
Spec Grav, UA: 1.025
Urobilinogen, UA: 0.2

## 2014-06-04 LAB — GLUCOSE, POCT (MANUAL RESULT ENTRY): POC GLUCOSE: 100 mg/dL — AB (ref 70–99)

## 2014-06-04 LAB — POCT CBC
GRANULOCYTE PERCENT: 43.6 % (ref 37–80)
HCT, POC: 42.4 % — AB (ref 43.5–53.7)
Hemoglobin: 14 g/dL — AB (ref 14.1–18.1)
Lymph, poc: 9 — AB (ref 0.6–3.4)
MCH: 30.9 pg (ref 27–31.2)
MCHC: 33.1 g/dL (ref 31.8–35.4)
MCV: 93.3 fL (ref 80–97)
MID (CBC): 0.8 (ref 0–0.9)
MPV: 7.7 fL (ref 0–99.8)
POC Granulocyte: 7.6 — AB (ref 2–6.9)
POC LYMPH PERCENT: 51.6 %L — AB (ref 10–50)
POC MID %: 4.8 %M (ref 0–12)
Platelet Count, POC: 248 10*3/uL (ref 142–424)
RBC: 4.54 M/uL — AB (ref 4.69–6.13)
RDW, POC: 13.7 %
WBC: 17.4 10*3/uL — AB (ref 4.6–10.2)

## 2014-06-04 MED ORDER — HYDROCODONE-ACETAMINOPHEN 5-325 MG PO TABS: 1.0000 | ORAL_TABLET | Freq: Four times a day (QID) | ORAL | Status: DC | PRN

## 2014-06-04 MED ORDER — TRIAMCINOLONE ACETONIDE 0.1 % EX CREA: 1.0000 "application " | TOPICAL_CREAM | Freq: Two times a day (BID) | CUTANEOUS | Status: DC

## 2014-06-04 MED ORDER — PREDNISONE 20 MG PO TABS: ORAL_TABLET | ORAL | Status: DC

## 2014-06-04 NOTE — Progress Notes (Deleted)
    MRN: [id number] DOB: 02-11-1946  Subjective:   Rodney Friedman is a 69 y.o. male presenting for ***.  *** smoking, *** alcohol.  Denies any other aggravating or relieving factors, no other questions or concerns.  Lonney has a current medication list which includes the following prescription(s): amlodipine, bupropion, lamotrigine, latanoprost, leuprolide acetate, metformin, metoprolol, pravastatin, valsartan, and warfarin.   has No Known Allergies.  Shalev  has a past medical history of Diabetes mellitus without complication; Leukemia; Prostate cancer; Cataract; Hypertension; CLL (chronic lymphocytic leukemia) (07/08/2013); Prostate cancer (07/08/2013); Anxiety; Depression; Heart murmur; Myocardial infarction; Substance abuse; and Tuberculosis. Also  has past surgical history that includes Coronary artery bypass graft (08/04/2004); Colonoscopy; and Eye surgery.  ROS As in subjective.  Objective:   Vitals: There were no vitals taken for this visit.  Physical Exam  No results found for this or any previous visit (from the past 24 hour(s)).   Assessment and Plan :     Jaynee Eagles, PA-C [facility] [phone] 06/04/2014 3:02 PM

## 2014-06-04 NOTE — Patient Instructions (Signed)
Take the prednisone for poison ivy and for the sciatica pain 3 pills daily for 2 days, then 2 daily for 2 days, then 1 daily for 2 days, then one half daily. Best taken after breakfast, but take the first pills this afternoon  Take hydrocodone when needed for severe pain  Use triamcinolone cream on the arm on the rash also.  Return or go to the emergency room if worse at anytime

## 2014-06-04 NOTE — Progress Notes (Signed)
Subjective: Patient is here for several things. The last 2 days he has had left lumbar pain going down into his left leg. It is been very painful. Cannot really get a comfortable position. The pain radiates down to and of the lateral thigh toward his knee.  He has a rash on his face and upper arms above the elbows. Apparently he had a similar rash last month and went to the eye doctor because of the facial rash and swollen eyelids. He was treated with valacyclovir. He said he got better after a few days. He works out in the yard again and it came back. He work out in the yard before the first episode.  He is a early diabetic or prediabetic, on metformin.  Objective shortness of breath that he thinks is part from anxiety. His daughter is dying in [place] from crack cocaine, and he expects to hear any day that she is found bed somewhere. This is very hard on him. He is also stressed a great deal by taking care of his 38 year old father. All this makes him anxious and breathe hard. He has a history of heart disease, has had bypass in the past. He has a heart valve that the doctors continued to follow but has not been a big problem.  Objective: Overweight man, anxious. Neck supple without JVD. Chest is clear. No wheezing rales or rhonchi. Heart has a grade 3/6 systolic murmur right upper sternal border but down across the precordium. His abdomen soft without mass or tenderness. No CVA tenderness right leg raise test negative. The points to the left flank is area of most pain and then on down his leg. He has stasis dermatitis of his ankles. He has a red puffy swollen face with the left eyelid almost swollen shut. His arms both have red rash on the area from the elbow to the short shirt sleeve.  Assessment: Allergic dermatitis Sciatica Anxiety Dyspnea secondary to anxiety Valvular heart disease, old History of coronary artery disease and CABG History of prediabetes History of chronic lymphocytic  leukemia  Plan: EKG LVH with strain pattern, unchanged from last summer. Occasional PVC.     Chest x-ray  CBC and glucose and urinalysis  [facility] reading (PRIMARY) by  Dr. Linna Darner. Normal CXR.  Thoracotomy wires.  Results for orders placed or performed in visit on 06/04/14  POCT CBC  Result Value Ref Range   WBC 17.4 (A) 4.6 - 10.2 K/uL   Lymph, poc 9.0 (A) 0.6 - 3.4   POC LYMPH PERCENT 51.6 (A) 10 - 50 %L   MID (cbc) 0.8 0 - 0.9   POC MID % 4.8 0 - 12 %M   POC Granulocyte 7.6 (A) 2 - 6.9   Granulocyte percent 43.6 37 - 80 %G   RBC 4.54 (A) 4.69 - 6.13 M/uL   Hemoglobin 14.0 (A) 14.1 - 18.1 g/dL   HCT, POC 42.4 (A) 43.5 - 53.7 %   MCV 93.3 80 - 97 fL   MCH, POC 30.9 27 - 31.2 pg   MCHC 33.1 31.8 - 35.4 g/dL   RDW, POC 13.7 %   Platelet Count, POC 248 142 - 424 K/uL   MPV 7.7 0 - 99.8 fL  POCT glucose (manual entry)  Result Value Ref Range   POC Glucose 100 (A) 70 - 99 mg/dl  POCT urinalysis dipstick  Result Value Ref Range   Color, UA yellow    Clarity, UA clear    Glucose, UA neg  Bilirubin, UA neg    Ketones, UA neg    Spec Grav, UA 1.025    Blood, UA trace-itnact    pH, UA 5.0    Protein, UA neg    Urobilinogen, UA 0.2    Nitrite, UA neg    Leukocytes, UA Negative   POCT UA - Microscopic Only  Result Value Ref Range   WBC, Ur, HPF, POC 0-1    RBC, urine, microscopic 0-1    Bacteria, U Microscopic neg    Mucus, UA neg    Epithelial cells, urine per micros 0-1    Crystals, Ur, HPF, POC neg    Casts, Ur, LPF, POC neg    Yeast, UA neg    Will treat with prednisone. He is not significantly diabetic. He should be able to tolerate this well. They should treat both the rash and the sciatica. If he is not doing better he is to return. See instructions  Also treating for the contact dermatitis.  Elevated white count is per his expectation for his chronic lymphocytic leukemia.

## 2014-06-15 ENCOUNTER — Ambulatory Visit: Payer: Medicare Other | Admitting: Pharmacist Clinician (PhC)/ Clinical Pharmacy Specialist

## 2014-06-16 ENCOUNTER — Ambulatory Visit (INDEPENDENT_AMBULATORY_CARE_PROVIDER_SITE_OTHER): Payer: Medicare Other | Admitting: Pharmacist Clinician (PhC)/ Clinical Pharmacy Specialist

## 2014-06-16 ENCOUNTER — Other Ambulatory Visit: Payer: Self-pay | Admitting: Physician Assistant

## 2014-06-16 DIAGNOSIS — I480 Paroxysmal atrial fibrillation: Secondary | ICD-10-CM | POA: Diagnosis not present

## 2014-06-16 DIAGNOSIS — Z7901 Long term (current) use of anticoagulants: Secondary | ICD-10-CM

## 2014-06-16 LAB — POCT INR: INR: 1.5

## 2014-06-18 ENCOUNTER — Other Ambulatory Visit: Payer: Self-pay | Admitting: Family Medicine

## 2014-07-01 ENCOUNTER — Other Ambulatory Visit: Payer: Self-pay | Admitting: Family Medicine

## 2014-07-06 ENCOUNTER — Ambulatory Visit: Payer: Medicare Other | Admitting: Pharmacist Clinician (PhC)/ Clinical Pharmacy Specialist

## 2014-07-08 ENCOUNTER — Ambulatory Visit (INDEPENDENT_AMBULATORY_CARE_PROVIDER_SITE_OTHER): Payer: Medicare Other | Admitting: Pharmacist Clinician (PhC)/ Clinical Pharmacy Specialist

## 2014-07-08 DIAGNOSIS — I480 Paroxysmal atrial fibrillation: Secondary | ICD-10-CM | POA: Diagnosis not present

## 2014-07-08 DIAGNOSIS — Z7901 Long term (current) use of anticoagulants: Secondary | ICD-10-CM | POA: Diagnosis not present

## 2014-07-08 LAB — POCT INR: INR: 2.4

## 2014-07-09 ENCOUNTER — Ambulatory Visit (INDEPENDENT_AMBULATORY_CARE_PROVIDER_SITE_OTHER): Payer: Medicare Other | Admitting: Emergency Medicine

## 2014-07-09 VITALS — BP 132/84 | HR 70 | Temp 98.2°F | Resp 17 | Ht 69.0 in | Wt 216.0 lb

## 2014-07-09 DIAGNOSIS — I25709 Atherosclerosis of coronary artery bypass graft(s), unspecified, with unspecified angina pectoris: Secondary | ICD-10-CM

## 2014-07-09 DIAGNOSIS — K4020 Bilateral inguinal hernia, without obstruction or gangrene, not specified as recurrent: Secondary | ICD-10-CM | POA: Diagnosis not present

## 2014-07-09 NOTE — Patient Instructions (Signed)

## 2014-07-09 NOTE — Progress Notes (Signed)
[facility] [address]  [place] [address] [phone]  Date:  07/09/2014   Name:  Rodney Friedman   DOB:  March 15, 1946   MRN:  [id number]  PCP:  Lamar Blinks, MD    Chief Complaint: Groin Pain and Leg Pain   History of Present Illness:  Rodney Friedman is a 69 y.o. very pleasant male patient who presents with the following:  History of prostate cancer treated with external beam radiation a year or so ago. Now has pain in left groin in medial proximal thigh.  No radiation No numbness or weakness No history of injury or overuse. No improvement with over the counter medications or other home remedies.  Denies other complaint or health concern today.   Patient Active Problem List   Diagnosis Date Noted  . Hematuria 01/05/2014  . Skin lesion 01/05/2014  . Mild aortic valve stenosis 01/03/2014  . Hypertensive heart disease 01/03/2014  . Hyperlipidemia 01/03/2014  . Mild obesity 01/03/2014  . Tobacco abuse 01/03/2014  . Atrial fibrillation 12/31/2013  . Long term (current) use of anticoagulants 12/31/2013  . CAD (coronary artery disease) 09/03/2013  . Pre-diabetes 09/03/2013  . 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 Prostate cancer 07/08/2013    Past Medical History  Diagnosis Date  . Diabetes mellitus without complication   . Leukemia     CLL  . Prostate cancer   . Cataract   . Hypertension   . 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 Prostate cancer 07/08/2013  . Anxiety   . Depression   . Heart murmur   . Myocardial infarction   . Substance abuse   . Tuberculosis     Past Surgical History  Procedure Laterality Date  . 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 Colonoscopy    . Eye surgery      History  Substance Use Topics  . Smoking status: Current Every Day Smoker -- 0.50 packs/day for 50 years  . Smokeless tobacco: Never Used  . Alcohol Use: No    Family History  Problem Relation Age of Onset  .  Cancer Mother     colon cancer  . Cancer Father     prostate ca  . Hypertension Father     No Known Allergies  Medication list has been reviewed and updated.  Current Outpatient Prescriptions on File Prior to Visit  Medication Sig Dispense Refill  . amLODipine (NORVASC) 10 MG tablet Take 10 mg by mouth daily.    Marland Kitchen buPROPion (WELLBUTRIN SR) 150 MG 12 hr tablet Take 150 mg by mouth 2 (two) times daily.    Marland Kitchen lamoTRIgine (LAMICTAL) 100 MG tablet TAKE 1 TABLET BY MOUTH 3 TIMES A DAY 90 tablet 2  . latanoprost (XALATAN) 0.005 % ophthalmic solution Once a day every third day    . Leuprolide Acetate (LUPRON DEPOT IM) Inject into the muscle every 6 (six) months.     . Leuprolide Acetate (LUPRON IJ) Inject as directed.    . metFORMIN (GLUCOPHAGE) 500 MG tablet TAKE 1 TABLET TWICE DAILY.    WILL NEED OFFICE VISIT FOR ADDITIONAL REFILLS 180 tablet 0  . metoprolol (TOPROL-XL) 200 MG 24 hr tablet Take 100 mg by mouth daily.    . pravastatin (PRAVACHOL) 80 MG tablet TAKE 1 TABLE (80 MG TOTAL) BY MOUTH DAILY.  "NEEDS OV FOR ADDITIONAL REFILLS" 30 tablet 0  . valsartan (DIOVAN) 320 MG tablet Take 1 tablet (320 mg total) by mouth daily. 30 tablet  3  . warfarin (COUMADIN) 4 MG tablet Take 1 tablet (4 mg total) by mouth daily. 90 tablet 1   No current facility-administered medications on file prior to visit.    Review of Systems:  As per HPI, otherwise negative.    Physical Examination: Filed Vitals:   07/09/14 0808  BP: 132/84  Pulse: 70  Temp: 98.2 F (36.8 C)  Resp: 17   Filed Vitals:   07/09/14 0808  Height: 5\' 9"  (1.753 m)  Weight: 216 lb (97.977 kg)   Body mass index is 31.88 kg/(m^2). Ideal Body Weight: Weight in (lb) to have BMI = 25: 168.9   GEN: WDWN, NAD, Non-toxic, Alert & Oriented x 3 HEENT: Atraumatic, Normocephalic.  Ears and Nose: No external deformity. EXTR: No clubbing/cyanosis/edema NEURO: Normal gait.  PSYCH: Normally interactive. Conversant. Not depressed or  anxious appearing.  Calm demeanor.  External genitalia normal male Bilateral inguinal hernia   Assessment and Plan: Hernia Surgery consult  Signed,  Ellison Carwin, MD

## 2014-07-10 ENCOUNTER — Other Ambulatory Visit: Payer: Self-pay | Admitting: Family Medicine

## 2014-07-10 DIAGNOSIS — H4011X2 Primary open-angle glaucoma, moderate stage: Secondary | ICD-10-CM | POA: Diagnosis not present

## 2014-07-14 ENCOUNTER — Encounter: Payer: Self-pay | Admitting: Family Medicine

## 2014-07-20 DIAGNOSIS — H4011X2 Primary open-angle glaucoma, moderate stage: Secondary | ICD-10-CM | POA: Diagnosis not present

## 2014-07-20 DIAGNOSIS — H02833 Dermatochalasis of right eye, unspecified eyelid: Secondary | ICD-10-CM | POA: Diagnosis not present

## 2014-07-20 DIAGNOSIS — H31011 Macula scars of posterior pole (postinflammatory) (post-traumatic), right eye: Secondary | ICD-10-CM | POA: Diagnosis not present

## 2014-07-20 DIAGNOSIS — H2511 Age-related nuclear cataract, right eye: Secondary | ICD-10-CM | POA: Diagnosis not present

## 2014-07-21 ENCOUNTER — Other Ambulatory Visit: Payer: Self-pay | Admitting: Family Medicine

## 2014-07-23 ENCOUNTER — Other Ambulatory Visit: Payer: Self-pay | Admitting: Family Medicine

## 2014-07-23 ENCOUNTER — Encounter: Payer: Self-pay | Admitting: Family Medicine

## 2014-07-23 DIAGNOSIS — I10 Essential (primary) hypertension: Secondary | ICD-10-CM

## 2014-07-23 MED ORDER — AMLODIPINE BESYLATE 10 MG PO TABS: 10.0000 mg | ORAL_TABLET | Freq: Every day | ORAL | Status: DC

## 2014-07-24 ENCOUNTER — Other Ambulatory Visit (INDEPENDENT_AMBULATORY_CARE_PROVIDER_SITE_OTHER): Payer: Self-pay | Admitting: General Surgery

## 2014-07-24 ENCOUNTER — Other Ambulatory Visit (INDEPENDENT_AMBULATORY_CARE_PROVIDER_SITE_OTHER): Payer: Self-pay | Admitting: *Deleted

## 2014-07-24 DIAGNOSIS — I4891 Unspecified atrial fibrillation: Secondary | ICD-10-CM | POA: Diagnosis not present

## 2014-07-24 DIAGNOSIS — Z951 Presence of aortocoronary bypass graft: Secondary | ICD-10-CM | POA: Diagnosis not present

## 2014-07-24 DIAGNOSIS — Z6831 Body mass index (BMI) 31.0-31.9, adult: Secondary | ICD-10-CM | POA: Diagnosis not present

## 2014-07-24 DIAGNOSIS — I252 Old myocardial infarction: Secondary | ICD-10-CM | POA: Diagnosis not present

## 2014-07-24 DIAGNOSIS — Z8546 Personal history of malignant neoplasm of prostate: Secondary | ICD-10-CM | POA: Diagnosis not present

## 2014-07-24 DIAGNOSIS — E118 Type 2 diabetes mellitus with unspecified complications: Secondary | ICD-10-CM | POA: Diagnosis not present

## 2014-07-24 DIAGNOSIS — M79652 Pain in left thigh: Secondary | ICD-10-CM

## 2014-07-24 DIAGNOSIS — I2510 Atherosclerotic heart disease of native coronary artery without angina pectoris: Secondary | ICD-10-CM | POA: Diagnosis not present

## 2014-07-24 DIAGNOSIS — Z8679 Personal history of other diseases of the circulatory system: Secondary | ICD-10-CM | POA: Diagnosis not present

## 2014-07-24 DIAGNOSIS — I10 Essential (primary) hypertension: Secondary | ICD-10-CM | POA: Diagnosis not present

## 2014-07-24 DIAGNOSIS — C9110 Chronic lymphocytic leukemia of B-cell type not having achieved remission: Secondary | ICD-10-CM | POA: Diagnosis not present

## 2014-07-24 NOTE — Addendum Note (Signed)
Addended by: Adin Hector on: 07/24/2014 02:39 PM   Modules accepted: Orders

## 2014-07-27 ENCOUNTER — Other Ambulatory Visit (INDEPENDENT_AMBULATORY_CARE_PROVIDER_SITE_OTHER): Payer: Self-pay | Admitting: *Deleted

## 2014-07-27 DIAGNOSIS — M79652 Pain in left thigh: Secondary | ICD-10-CM

## 2014-07-29 ENCOUNTER — Other Ambulatory Visit (INDEPENDENT_AMBULATORY_CARE_PROVIDER_SITE_OTHER): Payer: Self-pay

## 2014-08-04 ENCOUNTER — Other Ambulatory Visit: Payer: Self-pay | Admitting: Physician Assistant

## 2014-08-06 ENCOUNTER — Other Ambulatory Visit: Payer: Self-pay | Admitting: Family Medicine

## 2014-08-10 ENCOUNTER — Ambulatory Visit (INDEPENDENT_AMBULATORY_CARE_PROVIDER_SITE_OTHER): Payer: Medicare Other | Admitting: Pharmacist Clinician (PhC)/ Clinical Pharmacy Specialist

## 2014-08-10 DIAGNOSIS — Z7901 Long term (current) use of anticoagulants: Secondary | ICD-10-CM

## 2014-08-10 DIAGNOSIS — I480 Paroxysmal atrial fibrillation: Secondary | ICD-10-CM | POA: Diagnosis not present

## 2014-08-10 LAB — POCT INR: INR: 2.1

## 2014-08-11 ENCOUNTER — Other Ambulatory Visit: Payer: Self-pay | Admitting: Family Medicine

## 2014-08-11 ENCOUNTER — Ambulatory Visit
Admission: RE | Admit: 2014-08-11 | Discharge: 2014-08-11 | Disposition: A | Discharge status: Home / Self Care | Payer: Medicare Other | Source: Ambulatory Visit | Attending: General Surgery | Admitting: General Surgery

## 2014-08-11 ENCOUNTER — Encounter: Payer: Self-pay | Admitting: Family Medicine

## 2014-08-11 DIAGNOSIS — M79652 Pain in left thigh: Secondary | ICD-10-CM | POA: Diagnosis not present

## 2014-08-11 DIAGNOSIS — I10 Essential (primary) hypertension: Secondary | ICD-10-CM

## 2014-08-11 DIAGNOSIS — C61 Malignant neoplasm of prostate: Secondary | ICD-10-CM | POA: Diagnosis not present

## 2014-08-11 DIAGNOSIS — K4090 Unilateral inguinal hernia, without obstruction or gangrene, not specified as recurrent: Secondary | ICD-10-CM | POA: Diagnosis not present

## 2014-08-11 DIAGNOSIS — C9110 Chronic lymphocytic leukemia of B-cell type not having achieved remission: Secondary | ICD-10-CM | POA: Diagnosis not present

## 2014-08-12 ENCOUNTER — Telehealth: Payer: Self-pay | Admitting: *Deleted

## 2014-08-12 DIAGNOSIS — Z8546 Personal history of malignant neoplasm of prostate: Secondary | ICD-10-CM | POA: Diagnosis not present

## 2014-08-12 NOTE — Telephone Encounter (Signed)
Pt called regarding if an Rx was sent over for his metoprolol.  After researching I advised pt that it was sent over today to [facility] on [address].  Pt understood

## 2014-08-17 ENCOUNTER — Other Ambulatory Visit: Payer: Self-pay | Admitting: Physician Assistant

## 2014-08-19 DIAGNOSIS — Z8546 Personal history of malignant neoplasm of prostate: Secondary | ICD-10-CM | POA: Diagnosis not present

## 2014-08-19 MED ORDER — METOPROLOL SUCCINATE ER 200 MG PO TB24: ORAL_TABLET | ORAL | Status: DC

## 2014-08-20 MED ORDER — METOPROLOL SUCCINATE ER 100 MG PO TB24: ORAL_TABLET | ORAL | Status: DC

## 2014-08-20 NOTE — Addendum Note (Signed)
Addended by: Lamar Blinks C on: 08/20/2014 04:45 PM   Modules accepted: Orders

## 2014-08-27 DIAGNOSIS — C9110 Chronic lymphocytic leukemia of B-cell type not having achieved remission: Secondary | ICD-10-CM | POA: Diagnosis not present

## 2014-08-27 DIAGNOSIS — M79652 Pain in left thigh: Secondary | ICD-10-CM | POA: Diagnosis not present

## 2014-08-27 DIAGNOSIS — I252 Old myocardial infarction: Secondary | ICD-10-CM | POA: Diagnosis not present

## 2014-08-27 DIAGNOSIS — E118 Type 2 diabetes mellitus with unspecified complications: Secondary | ICD-10-CM | POA: Diagnosis not present

## 2014-08-27 DIAGNOSIS — I2510 Atherosclerotic heart disease of native coronary artery without angina pectoris: Secondary | ICD-10-CM | POA: Diagnosis not present

## 2014-08-27 DIAGNOSIS — I4891 Unspecified atrial fibrillation: Secondary | ICD-10-CM | POA: Diagnosis not present

## 2014-08-27 DIAGNOSIS — Z6831 Body mass index (BMI) 31.0-31.9, adult: Secondary | ICD-10-CM | POA: Diagnosis not present

## 2014-08-27 DIAGNOSIS — Z951 Presence of aortocoronary bypass graft: Secondary | ICD-10-CM | POA: Diagnosis not present

## 2014-08-27 DIAGNOSIS — Z8679 Personal history of other diseases of the circulatory system: Secondary | ICD-10-CM | POA: Diagnosis not present

## 2014-08-27 DIAGNOSIS — Z8546 Personal history of malignant neoplasm of prostate: Secondary | ICD-10-CM | POA: Diagnosis not present

## 2014-09-06 ENCOUNTER — Other Ambulatory Visit: Payer: Self-pay | Admitting: Family Medicine

## 2014-09-06 DIAGNOSIS — F3162 Bipolar disorder, current episode mixed, moderate: Secondary | ICD-10-CM

## 2014-09-07 NOTE — Telephone Encounter (Signed)
Dr Lorelei Pont, do you need to see pt before [place] this? I'm not sure what it is being rxd for so wasn't sure when he needs f/up.

## 2014-09-07 NOTE — Telephone Encounter (Signed)
Called him to clarify why he takes the lamictal- he takes this for bipolar disorder, anxiety and depression.  He does not see a psychiatrist recently.  He does go to NA and AA.  He has been stable on these medications for some time.  He wishes to research psychiatrists in the area and will let me know who he wants to see so I can make a referral Refilled his lamictal for now

## 2014-09-21 ENCOUNTER — Ambulatory Visit (INDEPENDENT_AMBULATORY_CARE_PROVIDER_SITE_OTHER): Payer: Medicare Other | Admitting: Pharmacist Clinician (PhC)/ Clinical Pharmacy Specialist

## 2014-09-21 DIAGNOSIS — I480 Paroxysmal atrial fibrillation: Secondary | ICD-10-CM | POA: Diagnosis not present

## 2014-09-21 DIAGNOSIS — Z7901 Long term (current) use of anticoagulants: Secondary | ICD-10-CM | POA: Diagnosis not present

## 2014-09-21 LAB — POCT INR: INR: 2.2

## 2014-09-25 ENCOUNTER — Other Ambulatory Visit: Payer: Self-pay | Admitting: Family Medicine

## 2014-09-29 DIAGNOSIS — H31011 Macula scars of posterior pole (postinflammatory) (post-traumatic), right eye: Secondary | ICD-10-CM | POA: Diagnosis not present

## 2014-09-29 DIAGNOSIS — H2511 Age-related nuclear cataract, right eye: Secondary | ICD-10-CM | POA: Diagnosis not present

## 2014-09-29 DIAGNOSIS — H35713 Central serous chorioretinopathy, bilateral: Secondary | ICD-10-CM | POA: Diagnosis not present

## 2014-10-05 ENCOUNTER — Encounter: Payer: Self-pay | Admitting: Family Medicine

## 2014-10-06 MED ORDER — POLYETHYLENE GLYCOL 3350 17 GM/SCOOP PO POWD: 17.0000 g | Freq: Every day | ORAL | Status: DC

## 2014-10-07 ENCOUNTER — Encounter: Payer: Self-pay | Admitting: Family Medicine

## 2014-10-08 ENCOUNTER — Ambulatory Visit (INDEPENDENT_AMBULATORY_CARE_PROVIDER_SITE_OTHER): Payer: Medicare Other | Admitting: Family Medicine

## 2014-10-08 VITALS — BP 128/70 | HR 83 | Temp 98.5°F | Resp 12 | Ht 69.0 in | Wt 213.0 lb

## 2014-10-08 DIAGNOSIS — R7309 Other abnormal glucose: Secondary | ICD-10-CM

## 2014-10-08 DIAGNOSIS — C9110 Chronic lymphocytic leukemia of B-cell type not having achieved remission: Secondary | ICD-10-CM

## 2014-10-08 DIAGNOSIS — E785 Hyperlipidemia, unspecified: Secondary | ICD-10-CM | POA: Diagnosis not present

## 2014-10-08 DIAGNOSIS — F3162 Bipolar disorder, current episode mixed, moderate: Secondary | ICD-10-CM

## 2014-10-08 DIAGNOSIS — R7303 Prediabetes: Secondary | ICD-10-CM

## 2014-10-08 DIAGNOSIS — I25709 Atherosclerosis of coronary artery bypass graft(s), unspecified, with unspecified angina pectoris: Secondary | ICD-10-CM

## 2014-10-08 DIAGNOSIS — I10 Essential (primary) hypertension: Secondary | ICD-10-CM | POA: Diagnosis not present

## 2014-10-08 DIAGNOSIS — Z1211 Encounter for screening for malignant neoplasm of colon: Secondary | ICD-10-CM

## 2014-10-08 DIAGNOSIS — I350 Nonrheumatic aortic (valve) stenosis: Secondary | ICD-10-CM | POA: Diagnosis not present

## 2014-10-08 LAB — POCT CBC
GRANULOCYTE PERCENT: 42.1 % (ref 37–80)
HEMATOCRIT: 43.7 % (ref 43.5–53.7)
HEMOGLOBIN: 14.7 g/dL (ref 14.1–18.1)
Lymph, poc: 8.8 — AB (ref 0.6–3.4)
MCH, POC: 30.5 pg (ref 27–31.2)
MCHC: 33.6 g/dL (ref 31.8–35.4)
MCV: 90.8 fL (ref 80–97)
MID (cbc): 1.2 — AB (ref 0–0.9)
MPV: 7.6 fL (ref 0–99.8)
POC GRANULOCYTE: 7.2 — AB (ref 2–6.9)
POC LYMPH %: 51 % — AB (ref 10–50)
POC MID %: 6.9 %M (ref 0–12)
Platelet Count, POC: 270 10*3/uL (ref 142–424)
RBC: 4.81 M/uL (ref 4.69–6.13)
RDW, POC: 14.5 %
WBC: 17.2 10*3/uL — AB (ref 4.6–10.2)

## 2014-10-08 LAB — COMPLETE METABOLIC PANEL WITH GFR
ALBUMIN: 4.5 g/dL (ref 3.5–5.2)
ALT: 34 U/L (ref 0–53)
AST: 23 U/L (ref 0–37)
Alkaline Phosphatase: 50 U/L (ref 39–117)
BUN: 14 mg/dL (ref 6–23)
CALCIUM: 9.5 mg/dL (ref 8.4–10.5)
CHLORIDE: 104 meq/L (ref 96–112)
CO2: 28 meq/L (ref 19–32)
Creat: 0.88 mg/dL (ref 0.50–1.35)
GFR, Est African American: >89 mL/min
GFR, Est Non African American: >89 mL/min
Glucose, Bld: 105 mg/dL — ABNORMAL HIGH (ref 70–99)
POTASSIUM: 4.1 meq/L (ref 3.5–5.3)
Sodium: 139 mEq/L (ref 135–145)
TOTAL PROTEIN: 6.8 g/dL (ref 6.0–8.3)
Total Bilirubin: 0.4 mg/dL (ref 0.2–1.2)

## 2014-10-08 LAB — LIPID PANEL
CHOLESTEROL: 185 mg/dL (ref 0–200)
HDL: 33 mg/dL — ABNORMAL LOW (ref >=40)
LDL Cholesterol: 101 mg/dL — ABNORMAL HIGH (ref 0–99)
Total CHOL/HDL Ratio: 5.6 Ratio
Triglycerides: 254 mg/dL — ABNORMAL HIGH (ref <150)
VLDL: 51 mg/dL — ABNORMAL HIGH (ref 0–40)

## 2014-10-08 LAB — POCT GLYCOSYLATED HEMOGLOBIN (HGB A1C): HEMOGLOBIN A1C: 5.9

## 2014-10-08 LAB — MICROALBUMIN, URINE: Microalb, Ur: 1.9 mg/dL (ref <2.0)

## 2014-10-08 MED ORDER — METOPROLOL SUCCINATE ER 100 MG PO TB24: ORAL_TABLET | ORAL | Status: DC

## 2014-10-08 MED ORDER — LAMOTRIGINE 100 MG PO TABS: 100.0000 mg | ORAL_TABLET | Freq: Three times a day (TID) | ORAL | Status: DC

## 2014-10-08 MED ORDER — AMLODIPINE BESYLATE 10 MG PO TABS: 10.0000 mg | ORAL_TABLET | Freq: Every day | ORAL | Status: DC

## 2014-10-08 MED ORDER — METFORMIN HCL 500 MG PO TABS: ORAL_TABLET | ORAL | Status: DC

## 2014-10-08 MED ORDER — VALSARTAN 320 MG PO TABS: 320.0000 mg | ORAL_TABLET | Freq: Every day | ORAL | Status: DC

## 2014-10-08 MED ORDER — WARFARIN SODIUM 4 MG PO TABS: 4.0000 mg | ORAL_TABLET | Freq: Every day | ORAL | Status: DC

## 2014-10-08 MED ORDER — BUPROPION HCL ER (SR) 150 MG PO TB12: 150.0000 mg | ORAL_TABLET | Freq: Two times a day (BID) | ORAL | Status: DC

## 2014-10-08 MED ORDER — PRAVASTATIN SODIUM 80 MG PO TABS: 80.0000 mg | ORAL_TABLET | Freq: Every day | ORAL | Status: DC

## 2014-10-08 NOTE — Progress Notes (Signed)
Subjective: CBC show man who has a history of mild diabetes. He is here for his checkup. He has no major acute complaints. Wants labs done. Wants his Hemoccult tests done. He has taken his medications on a regular basis and needs them refilled. He has stress in his life with a daughter who is addicted to crack cocaine. He is going down to [place] to visit her sister. He cares for his 67 year old father. Cardiovascular unremarkable. Respiratory respiratory: Unremarkable. GI: Unremarkable. GU: Has urinary dribbling and has to wear a pad for the leakage. No problems with his feet. He sees an eye doctor yearly. He does not check his sugars. He does have a history of coronary artery disease and CABG but that is all stable. He is on chronic and her Coreg therapy for atrial fibrillation and that is monitored and does well. We prescribed most of his medications from here and he would like refills provided today.  Objective: Overweight man in no acute distress. Neck supple without nodes or thyromegaly. Throat clear. Chest clear to auscultation. Grade 2/6 systolic ejection murmur in aortic area. Evidence of mass or tenderness. He has pain in his left groin intermittently which been evaluated then they never found a cause of. He is nontender today. Extremities without edema. Feet look normal.    Results for orders placed or performed in visit on 10/08/14  POCT glycosylated hemoglobin (Hb A1C)  Result Value Ref Range   Hemoglobin A1C 5.9   POCT CBC  Result Value Ref Range   WBC 17.2 (A) 4.6 - 10.2 K/uL   Lymph, poc 8.8 (A) 0.6 - 3.4   POC LYMPH PERCENT 51.0 (A) 10 - 50 %L   MID (cbc) 1.2 (A) 0 - 0.9   POC MID % 6.9 0 - 12 %M   POC Granulocyte 7.2 (A) 2 - 6.9   Granulocyte percent 42.1 37 - 80 %G   RBC 4.81 4.69 - 6.13 M/uL   Hemoglobin 14.7 14.1 - 18.1 g/dL   HCT, POC 43.7 43.5 - 53.7 %   MCV 90.8 80 - 97 fL   MCH, POC 30.5 27 - 31.2 pg   MCHC 33.6 31.8 - 35.4 g/dL   RDW, POC 14.5 %   Platelet Count,  POC 270 142 - 424 K/uL   MPV 7.6 0 - 99.8 fL   Assessment: Diabetes History of bipolar History of anxiety and depression Overweight History coronary artery disease Murmur of aortic sclerosis Hypertension Hyperlipidemia   Plan: Refill of his medications. His labs were good. The labs are still pending. See him back in 6 months, sooner if problems.

## 2014-10-08 NOTE — Patient Instructions (Signed)
Continue your current medications  Plan to return in about 6 months, sooner if problems  I will let you know the results of some additional labs in a few days.

## 2014-10-09 ENCOUNTER — Encounter: Payer: Self-pay | Admitting: Family Medicine

## 2014-11-02 ENCOUNTER — Ambulatory Visit: Payer: Medicare Other | Admitting: Pharmacist Clinician (PhC)/ Clinical Pharmacy Specialist

## 2014-11-11 ENCOUNTER — Ambulatory Visit (INDEPENDENT_AMBULATORY_CARE_PROVIDER_SITE_OTHER): Payer: Medicare Other | Admitting: Pharmacist

## 2014-11-11 DIAGNOSIS — I480 Paroxysmal atrial fibrillation: Secondary | ICD-10-CM | POA: Diagnosis not present

## 2014-11-11 DIAGNOSIS — Z7901 Long term (current) use of anticoagulants: Secondary | ICD-10-CM

## 2014-11-11 LAB — POCT INR: INR: 1.7

## 2014-11-14 ENCOUNTER — Other Ambulatory Visit: Payer: Self-pay | Admitting: Family Medicine

## 2014-12-16 ENCOUNTER — Ambulatory Visit (INDEPENDENT_AMBULATORY_CARE_PROVIDER_SITE_OTHER): Payer: Medicare Other | Admitting: Pharmacist Clinician (PhC)/ Clinical Pharmacy Specialist

## 2014-12-16 DIAGNOSIS — Z7901 Long term (current) use of anticoagulants: Secondary | ICD-10-CM

## 2014-12-16 DIAGNOSIS — I480 Paroxysmal atrial fibrillation: Secondary | ICD-10-CM

## 2014-12-16 LAB — POCT INR: INR: 1.5

## 2014-12-31 ENCOUNTER — Ambulatory Visit (INDEPENDENT_AMBULATORY_CARE_PROVIDER_SITE_OTHER): Payer: Medicare Other | Admitting: Cardiovascular Disease

## 2014-12-31 ENCOUNTER — Encounter: Payer: Self-pay | Admitting: Cardiovascular Disease

## 2014-12-31 ENCOUNTER — Ambulatory Visit (INDEPENDENT_AMBULATORY_CARE_PROVIDER_SITE_OTHER): Payer: Medicare Other | Admitting: Pharmacist Clinician (PhC)/ Clinical Pharmacy Specialist

## 2014-12-31 VITALS — BP 112/56 | HR 59 | Resp 16 | Ht 69.0 in | Wt 208.0 lb

## 2014-12-31 DIAGNOSIS — I480 Paroxysmal atrial fibrillation: Secondary | ICD-10-CM

## 2014-12-31 DIAGNOSIS — I25709 Atherosclerosis of coronary artery bypass graft(s), unspecified, with unspecified angina pectoris: Secondary | ICD-10-CM | POA: Diagnosis not present

## 2014-12-31 DIAGNOSIS — I25708 Atherosclerosis of coronary artery bypass graft(s), unspecified, with other forms of angina pectoris: Secondary | ICD-10-CM | POA: Diagnosis not present

## 2014-12-31 DIAGNOSIS — R0989 Other specified symptoms and signs involving the circulatory and respiratory systems: Secondary | ICD-10-CM | POA: Diagnosis not present

## 2014-12-31 DIAGNOSIS — Z7901 Long term (current) use of anticoagulants: Secondary | ICD-10-CM | POA: Diagnosis not present

## 2014-12-31 DIAGNOSIS — I119 Hypertensive heart disease without heart failure: Secondary | ICD-10-CM

## 2014-12-31 DIAGNOSIS — E785 Hyperlipidemia, unspecified: Secondary | ICD-10-CM

## 2014-12-31 LAB — POCT INR: INR: 2.1

## 2014-12-31 NOTE — Patient Instructions (Signed)
Medication Instructions:   Continue same medications  Labwork:  None   Testing/Procedures:  Your physician has requested that you have a carotid duplex. This test is an ultrasound of the carotid arteries in your neck. It looks at blood flow through these arteries that supply the brain with blood. Allow one hour for this exam. There are no restrictions or special instructions.  We will call and/or release to MyChart.    Follow-Up:  One year

## 2014-12-31 NOTE — Progress Notes (Signed)
Patient ID: Rodney Friedman, male   DOB: [birth year], 69 y.o.   MRN: [id number]     Cardiology Office Note   Date:  12/31/2014   ID:  Rodney Friedman, DOB [birth year], MRN [id number]  PCP:  Lamar Blinks, MD  Cardiologist:   Sanda Klein, MD   Chief Complaint  Patient presents with  . Annual Exam    Patient has felt light headed and dizzy for sonme time over the past year.      History of Present Illness: Rodney Friedman is a 69 y.o. male who presents for  CAD status post CABG, paroxysmal atrial fibrillation , mild aortic stenosis, carotid artery stenosis. He also has systemic hypertension and hyperlipidemia and type 2 diabetes mellitus.   He has generally done well over the last year. He remembers maybe a dozen episodes of "heartburn" (his previous angina pattern) usually associated with a sensation of a light headache behind his eyes and some dizziness. These episodes are not associated with activity and appeared to be unprovoked. He has also had a couple of spells of profound diaphoresis and weakness, symptoms that he associates with his previous episode of atrial fibrillation with rapid ventricular response. All in all his symptoms appear to be following a very stable pattern over the last 3 or 4 years. He has not had any bleeding problems or focal neurological events while taking warfarin. He is mildly obese with a BMI of almost 31. He can perform physical activity without any complaints.  He has a history of coronary disease and underwent 3 vessel bypass surgery in [place] in 2006 (" heartburn" and left antecubital pain ) in July 2014 he had an episode of atrial fibrillation with 2014. He has significant left ventricular hypertrophy and very mild aortic valve stenosis. He has preserved left ventricular systolic function although there is some degree of anterior hypokinesis by previous echo. His nuclear stress test in June 2013 showed normal pattern of perfusion and an ejection fraction of  52%. He has bilateral carotid bruits that are reportedly due to external carotid artery stenoses.  His last carotid ultrasound was performed in 2013.   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 He has treated prostate cancer and is followed by Dr. Alvy Bimler.  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 Unfortunately he still smokes roughly 10 cigarettes a day , which he believes he needs due to anxiety.    Past Medical History  Diagnosis Date  . Diabetes mellitus without complication   . Leukemia     CLL  . Prostate cancer   . Cataract   . Hypertension   . 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 Prostate cancer 07/08/2013  . Anxiety   . Depression   . Heart murmur   . Myocardial infarction   . Substance abuse   . Tuberculosis     Past Surgical History  Procedure Laterality Date  . 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 Colonoscopy    . Eye surgery       Current Outpatient Prescriptions  Medication Sig Dispense Refill  . amLODipine (NORVASC) 10 MG tablet Take 1 tablet (10 mg total) by mouth daily. 90 tablet 1  . buPROPion (WELLBUTRIN SR) 150 MG 12 hr tablet Take 1 tablet (150 mg total) by mouth 2 (two) times daily. 180 tablet 3  . Calcium Carbonate-Vit  D-Min (CALCIUM 1200 PO) Take 1,200 mg by mouth daily.    Marland Kitchen lamoTRIgine (LAMICTAL) 100 MG tablet Take 1 tablet (100 mg total) by mouth 3 (three) times daily. 90 tablet 3  . latanoprost (XALATAN) 0.005 % ophthalmic solution Once a day every third day    . metFORMIN (GLUCOPHAGE) 500 MG tablet TAKE 1 TABLET TWICE A DAY. 180 tablet 3  . metoprolol succinate (TOPROL-XL) 100 MG 24 hr tablet Take 1 daily 90 tablet 3  . pravastatin (PRAVACHOL) 80 MG tablet Take 1 tablet (80 mg total) by mouth daily. 90 tablet 3  . valsartan (DIOVAN) 320 MG tablet Take 1 tablet (320 mg total) by mouth daily. 90 tablet  3  . warfarin (COUMADIN) 4 MG tablet Take 1 tablet (4 mg total) by mouth daily. 90 tablet 3   No current facility-administered medications for this visit.    Allergies:   Review of patient's allergies indicates no known allergies.    Social History:  The patient  reports that he has been smoking Cigarettes.  He has a 37.5 pack-year smoking history. He has never used smokeless tobacco. He reports that he does not drink alcohol or use illicit drugs.   Family History:  The patient's family history includes Cancer in his father and mother; Hypertension in his father.    ROS:  Please see the history of present illness.    Otherwise, review of systems positive for  anxiety.   All other systems are reviewed and negative.    PHYSICAL EXAM: VS:  BP 112/56 mmHg  Pulse 59  Resp 16  Ht 5\' 9"  (1.753 m)  Wt 208 lb (94.348 kg)  BMI 30.70 kg/m2 , BMI Body mass index is 30.7 kg/(m^2).  General: Alert, oriented x3, no distress Head: no evidence of trauma, PERRL, EOMI, no exophtalmos or lid lag, no myxedema, no xanthelasma; normal ears, nose and oropharynx Neck: normal jugular venous pulsations and no hepatojugular reflux; brisk carotid pulses without delay and loud bilateral ( left greater than right) carotid bruits Chest: clear to auscultation, no signs of consolidation by percussion or palpation, normal fremitus, symmetrical and full respiratory excursions Cardiovascular: normal position and quality of the apical impulse, regular rhythm, normal first and second heart sounds,  Grade 2 /6 early peaking aortic ejection systolic murmur, no diastolic murmurs, rubs or gallops Abdomen: no tenderness or distention, no masses by palpation, no abnormal pulsatility or arterial bruits, normal bowel sounds, no hepatosplenomegaly Extremities: no clubbing, cyanosis or edema; 2+ radial, ulnar and brachial pulses bilaterally; 2+ right femoral, posterior tibial and dorsalis pedis pulses; 2+ left femoral, posterior  tibial and dorsalis pedis pulses; no subclavian or femoral bruits Neurological: grossly nonfocal Psych: euthymic mood, full affect   EKG:  EKG is ordered today. The ekg ordered today demonstrates  Normal sinus rhythm with first-degree AV block  210 ms, prominent left ventricular hypertrophy with QRS widening 122 ms, QTC 471 ms. 3 PVCs are seen on the tracing of 20 seconds duration.   Recent Labs: 01/05/2014: Platelets 218 10/08/2014: ALT 34; BUN 14; Creat 0.88; Hemoglobin 14.7; Potassium 4.1; Sodium 139    Lipid Panel    Component Value Date/Time   CHOL 185 10/08/2014 0928   TRIG 254* 10/08/2014 0928   HDL 33* 10/08/2014 0928   CHOLHDL 5.6 10/08/2014 0928   VLDL 51* 10/08/2014 0928   LDLCALC 101* 10/08/2014 0928      Wt Readings from Last 3 Encounters:  12/31/14 208 lb (94.348 kg)  10/08/14 213  lb (96.616 kg)  07/09/14 216 lb (97.977 kg)     ASSESSMENT AND PLAN:  1.  CAD status post CABG.  He seems to have occasional angina pectoris that is not associated with activity. I wonder whether this could be coronary vasospasm, related to smoking or whether could reflect paroxysmal arrhythmia such as atrial fibrillation. He does not appear to be a tall concern by the symptoms since they follow an infrequent and stable pattern. Have asked him to call with any acceleration in the frequency of events , severity or duration of chest discomfort and have told him to always seek emergency medical attention if they should last for more than 30 minutes.  2.  Hyperlipidemia: target LDL cholesterol should really be less than 70 in the setting of previous bypass surgery and type 2 diabetes mellitus. He is on the maximum dose of pravastatin. He plans to lose weight and improve his diet. Would like to avoid changing his medication.  3.  Hypertension , well treated. If he loses some weight mainly actually have to back off some of his antihypertensives. He does not have any symptoms of hypotension at  this time.  4.  Very mild aortic valve stenosis  5.  History of paroxysmal atrial fibrillation. This could be the cause of his occasional episodes of diaphoresis and chest discomfort. The episodes are not frequent enough so that we would likely catch them on event monitoring. Have asked him to call me should the prevalence of his complaints increase.  Continue anticoagulation  6.  Ongoing tobacco use. Strongly recommended he consider smoking cessation again.  7.  Bilateral carotid bruits. It has been more than 3 years since his last scan and I have recommended that it be repeated.    Current medicines are reviewed at length with the patient today.  The patient denies concerns regarding medicines.  The following changes have been made:  no change  Labs/ tests ordered today include:  Orders Placed This Encounter  Procedures  . EKG 12-Lead    Patient Instructions  Medication Instructions:   Continue same medications  Labwork:  None   Testing/Procedures:  Your physician has requested that you have a carotid duplex. This test is an ultrasound of the carotid arteries in your neck. It looks at blood flow through these arteries that supply the brain with blood. Allow one hour for this exam. There are no restrictions or special instructions.  We will call and/or release to MyChart.    Follow-Up:  One year         Mikael Spray, MD  12/31/2014 12:00 PM    Sanda Klein, MD, [facility] [phone] office [phone] pager

## 2015-01-04 ENCOUNTER — Telehealth: Payer: Self-pay | Admitting: Hematology and Oncology

## 2015-01-04 ENCOUNTER — Ambulatory Visit (HOSPITAL_BASED_OUTPATIENT_CLINIC_OR_DEPARTMENT_OTHER): Payer: Medicare Other | Admitting: Hematology and Oncology

## 2015-01-04 ENCOUNTER — Encounter: Payer: Self-pay | Admitting: Hematology and Oncology

## 2015-01-04 ENCOUNTER — Other Ambulatory Visit (HOSPITAL_BASED_OUTPATIENT_CLINIC_OR_DEPARTMENT_OTHER): Payer: Medicare Other

## 2015-01-04 VITALS — BP 121/55 | HR 55 | Temp 98.0°F | Resp 16 | Ht 69.0 in | Wt 210.0 lb

## 2015-01-04 DIAGNOSIS — C61 Malignant neoplasm of prostate: Secondary | ICD-10-CM

## 2015-01-04 DIAGNOSIS — R7309 Other abnormal glucose: Secondary | ICD-10-CM | POA: Diagnosis not present

## 2015-01-04 DIAGNOSIS — R7303 Prediabetes: Secondary | ICD-10-CM

## 2015-01-04 DIAGNOSIS — Z720 Tobacco use: Secondary | ICD-10-CM

## 2015-01-04 DIAGNOSIS — Z7901 Long term (current) use of anticoagulants: Secondary | ICD-10-CM

## 2015-01-04 DIAGNOSIS — C9110 Chronic lymphocytic leukemia of B-cell type not having achieved remission: Secondary | ICD-10-CM

## 2015-01-04 LAB — CBC WITH DIFFERENTIAL/PLATELET
BASO%: 0.4 % (ref 0.0–2.0)
Basophils Absolute: 0.1 10*3/uL (ref 0.0–0.1)
EOS%: 1.4 % (ref 0.0–7.0)
Eosinophils Absolute: 0.2 10*3/uL (ref 0.0–0.5)
HEMATOCRIT: 41 % (ref 38.4–49.9)
HEMOGLOBIN: 13.8 g/dL (ref 13.0–17.1)
LYMPH#: 8.3 10*3/uL — AB (ref 0.9–3.3)
LYMPH%: 54.3 % — ABNORMAL HIGH (ref 14.0–49.0)
MCH: 30.3 pg (ref 27.2–33.4)
MCHC: 33.5 g/dL (ref 32.0–36.0)
MCV: 90.5 fL (ref 79.3–98.0)
MONO#: 0.8 10*3/uL (ref 0.1–0.9)
MONO%: 5 % (ref 0.0–14.0)
NEUT#: 5.9 10*3/uL (ref 1.5–6.5)
NEUT%: 38.9 % — AB (ref 39.0–75.0)
PLATELETS: 206 10*3/uL (ref 140–400)
RBC: 4.54 10*6/uL (ref 4.20–5.82)
RDW: 13.3 % (ref 11.0–14.6)
WBC: 15.3 10*3/uL — ABNORMAL HIGH (ref 4.0–10.3)

## 2015-01-04 LAB — COMPREHENSIVE METABOLIC PANEL (CC13)
ALBUMIN: 3.7 g/dL (ref 3.5–5.0)
ALK PHOS: 47 U/L (ref 40–150)
ALT: 33 U/L (ref 0–55)
ANION GAP: 9 meq/L (ref 3–11)
AST: 19 U/L (ref 5–34)
BILIRUBIN TOTAL: 0.23 mg/dL (ref 0.20–1.20)
BUN: 18.7 mg/dL (ref 7.0–26.0)
CALCIUM: 9 mg/dL (ref 8.4–10.4)
CO2: 21 mEq/L — ABNORMAL LOW (ref 22–29)
CREATININE: 0.9 mg/dL (ref 0.7–1.3)
Chloride: 111 mEq/L — ABNORMAL HIGH (ref 98–109)
EGFR: 89 mL/min/{1.73_m2} — ABNORMAL LOW (ref >90)
Glucose: 115 mg/dl (ref 70–140)
Potassium: 4.3 mEq/L (ref 3.5–5.1)
Sodium: 141 mEq/L (ref 136–145)
TOTAL PROTEIN: 6.3 g/dL — AB (ref 6.4–8.3)

## 2015-01-04 LAB — LACTATE DEHYDROGENASE (CC13): LDH: 180 U/L (ref 125–245)

## 2015-01-04 LAB — TECHNOLOGIST REVIEW

## 2015-01-04 NOTE — Assessment & Plan Note (Signed)
I would defer treatment to his urologist. The patient had stopped taking Lupron and according to him, the last PSA was undetectable.

## 2015-01-04 NOTE — Assessment & Plan Note (Signed)
He is started on metformin and attempted to exercise and modify his diet. He is attempting to lose weight to 200 pounds by next year I reinforced the importance of nicotine cessation in the setting of diabetes

## 2015-01-04 NOTE — Progress Notes (Signed)
[place] OFFICE PROGRESS NOTE  Patient Care Team: Darreld Mclean, MD as PCP - General (Family Medicine) Kathie Rhodes, MD as Consulting Physician (Urology) Sanda Klein, MD as Consulting Physician (Cardiology) Zebedee Iba, MD as Consulting Physician (Ophthalmology)  SUMMARY OF ONCOLOGIC HISTORY: Oncology History   Prostate cancer   Primary site: Prostate (Left)   Staging method: AJCC 7th Edition   Clinical: Stage IIB (T1c, N0, M0) signed by Heath Lark, MD on 07/09/2013 10:04 AM   Summary: Stage IIB (T1c, N0, M0)       Prostate cancer   07/12/2012 Tumor Marker PSA was high at 9.6   09/30/2012 Procedure He underwent prostate biopsy at confirm diagnosis of prostate cancer.   11/11/2012 - 01/09/2013 Radiation Therapy The patient completed radiation therapy to his prostate using IM RT technique    INTERVAL HISTORY: Please see below for problem oriented charting.Marland Kitchen He returns for further follow-up. He was diagnosed being prediabetic and started on metformin. He denies new lymphadenopathy. He continues to smoke and is attempting to quit smoking. He is undergoing a lot of stress due to his daughter recently committed to a rehabilitation facility for addiction. He completed recent treatment with Lupron and follow with urologist closely. The patient denies any recent signs or symptoms of bleeding such as spontaneous epistaxis, hematuria or hematochezia.  REVIEW OF SYSTEMS:   Constitutional: Denies fevers, chills or abnormal weight loss Eyes: Denies blurriness of vision Ears, nose, mouth, throat, and face: Denies mucositis or sore throat Respiratory: Denies cough, dyspnea or wheezes Cardiovascular: Denies palpitation, chest discomfort or lower extremity swelling Gastrointestinal:  Denies nausea, heartburn or change in bowel habits Skin: Denies abnormal skin rashes Lymphatics: Denies new lymphadenopathy or easy bruising Neurological:Denies numbness, tingling or new  weaknesses Behavioral/Psych: Mood is stable, no new changes  All other systems were reviewed with the patient and are negative.  I have reviewed the past medical history, past surgical history, social history and family history with the patient and they are unchanged from previous note.  ALLERGIES:  has No Known Allergies.  MEDICATIONS:  Current Outpatient Prescriptions  Medication Sig Dispense Refill  . amLODipine (NORVASC) 10 MG tablet Take 1 tablet (10 mg total) by mouth daily. 90 tablet 1  . B Complex Vitamins (VITAMIN-B COMPLEX PO) Take by mouth.    Marland Kitchen BIOTIN PO Take by mouth.    Marland Kitchen buPROPion (WELLBUTRIN SR) 150 MG 12 hr tablet Take 1 tablet (150 mg total) by mouth 2 (two) times daily. 180 tablet 3  . Calcium Carbonate-Vit D-Min (CALCIUM 1200 PO) Take 1,200 mg by mouth daily.    Marland Kitchen lamoTRIgine (LAMICTAL) 100 MG tablet Take 1 tablet (100 mg total) by mouth 3 (three) times daily. 90 tablet 3  . latanoprost (XALATAN) 0.005 % ophthalmic solution Once a day every third day    . metFORMIN (GLUCOPHAGE) 500 MG tablet TAKE 1 TABLET TWICE A DAY. 180 tablet 3  . metoprolol succinate (TOPROL-XL) 100 MG 24 hr tablet Take 1 daily 90 tablet 3  . pravastatin (PRAVACHOL) 80 MG tablet Take 1 tablet (80 mg total) by mouth daily. 90 tablet 3  . UNABLE TO FIND Med Name:  Glycopene    . valsartan (DIOVAN) 320 MG tablet Take 1 tablet (320 mg total) by mouth daily. 90 tablet 3  . vitamin C (ASCORBIC ACID) 500 MG tablet Take 500 mg by mouth daily.    Marland Kitchen warfarin (COUMADIN) 4 MG tablet Take 1 tablet (4 mg total) by mouth  daily. 90 tablet 3   No current facility-administered medications for this visit.    PHYSICAL EXAMINATION: ECOG PERFORMANCE STATUS: 0 - Asymptomatic  Filed Vitals:   01/04/15 0824  BP: 121/55  Pulse: 55  Temp: 98 F (36.7 C)  Resp: 16   Filed Weights   01/04/15 0824  Weight: 210 lb (95.255 kg)    GENERAL:alert, no distress and comfortable SKIN: skin color, texture, turgor are  normal, no rashes or significant lesions EYES: normal, Conjunctiva are pink and non-injected, sclera clear OROPHARYNX:no exudate, no erythema and lips, buccal mucosa, and tongue normal  NECK: supple, thyroid normal size, non-tender, without nodularity LYMPH:  There is palpable fullness in the left axilla. LUNGS: clear to auscultation and percussion with normal breathing effort HEART: regular rate & rhythm and no murmurs and no lower extremity edema ABDOMEN:abdomen soft, non-tender and normal bowel sounds Musculoskeletal:no cyanosis of digits and no clubbing  NEURO: alert & oriented x 3 with fluent speech, no focal motor/sensory deficits  LABORATORY DATA:  I have reviewed the data as listed    Component Value Date/Time   NA 139 10/08/2014 0928   K 4.1 10/08/2014 0928   CL 104 10/08/2014 0928   CO2 28 10/08/2014 0928   GLUCOSE 105* 10/08/2014 0928   BUN 14 10/08/2014 0928   CREATININE 0.88 10/08/2014 0928   CREATININE 0.84 07/08/2013 1045   CALCIUM 9.5 10/08/2014 0928   PROT 6.8 10/08/2014 0928   ALBUMIN 4.5 10/08/2014 0928   AST 23 10/08/2014 0928   ALT 34 10/08/2014 0928   ALKPHOS 50 10/08/2014 0928   BILITOT 0.4 10/08/2014 0928   GFRNONAA >89 10/08/2014 0928   GFRAA >89 10/08/2014 0928    No results found for: SPEP, UPEP  Lab Results  Component Value Date   WBC 15.3* 01/04/2015   NEUTROABS 5.9 01/04/2015   HGB 13.8 01/04/2015   HCT 41.0 01/04/2015   MCV 90.5 01/04/2015   PLT 206 01/04/2015      Chemistry      Component Value Date/Time   NA 139 10/08/2014 0928   K 4.1 10/08/2014 0928   CL 104 10/08/2014 0928   CO2 28 10/08/2014 0928   BUN 14 10/08/2014 0928   CREATININE 0.88 10/08/2014 0928   CREATININE 0.84 07/08/2013 1045      Component Value Date/Time   CALCIUM 9.5 10/08/2014 0928   ALKPHOS 50 10/08/2014 0928   AST 23 10/08/2014 0928   ALT 34 10/08/2014 0928   BILITOT 0.4 10/08/2014 0928      ASSESSMENT & PLAN:  CLL (chronic lymphocytic  leukemia) Clinically, his white blood cell count is stable. He has small palpable lymphadenopathy in the neck and axilla. I recommend observation only and see him back on a yearly basis with history, physical examination and blood work.   Prostate cancer I would defer treatment to his urologist. The patient had stopped taking Lupron and according to him, the last PSA was undetectable.   Tobacco abuse I spent some time counseling the patient the importance of tobacco cessation. he is currently attempting to quit on his own  I gave him patient education handout and encouraged him to sign up for smoking cessation class. The patient has made a commitment with me that he will try to cut down to 12 cigarettes per day or less by the time I see him next year.   Pre-diabetes He is started on metformin and attempted to exercise and modify his diet. He is attempting to lose  weight to 200 pounds by next year I reinforced the importance of nicotine cessation in the setting of diabetes  Long term current use of anticoagulant therapy He is doing well with no further hematuria since I saw him.   Orders Placed This Encounter  Procedures  . CBC with Differential/Platelet    Standing Status: Future     Number of Occurrences:      Standing Expiration Date: 02/08/2016   All questions were answered. The patient knows to call the clinic with any problems, questions or concerns. No barriers to learning was detected. I spent 15 minutes counseling the patient face to face. The total time spent in the appointment was 20 minutes and more than 50% was on counseling and review of test results     [facility], [place], MD 01/04/2015 8:49 AM

## 2015-01-04 NOTE — Assessment & Plan Note (Addendum)
Clinically, his white blood cell count is stable. He has small palpable lymphadenopathy in the neck and axilla. I recommend observation only and see him back on a yearly basis with history, physical examination and blood work.  

## 2015-01-04 NOTE — Assessment & Plan Note (Signed)
He is doing well with no further hematuria since I saw him.

## 2015-01-04 NOTE — Telephone Encounter (Signed)
Gave patient avs report and appointments for August 2017.  °

## 2015-01-04 NOTE — Assessment & Plan Note (Signed)
I spent some time counseling the patient the importance of tobacco cessation. he is currently attempting to quit on his own  I gave him patient education handout and encouraged him to sign up for smoking cessation class. The patient has made a commitment with me that he will try to cut down to 12 cigarettes per day or less by the time I see him next year.

## 2015-01-12 ENCOUNTER — Ambulatory Visit (HOSPITAL_COMMUNITY)
Admission: RE | Admit: 2015-01-12 | Discharge: 2015-01-12 | Disposition: A | Discharge status: Home / Self Care | Payer: Medicare Other | Source: Ambulatory Visit | Attending: Cardiology | Admitting: Cardiology

## 2015-01-12 DIAGNOSIS — R0989 Other specified symptoms and signs involving the circulatory and respiratory systems: Secondary | ICD-10-CM

## 2015-01-12 DIAGNOSIS — I6523 Occlusion and stenosis of bilateral carotid arteries: Secondary | ICD-10-CM | POA: Insufficient documentation

## 2015-01-13 ENCOUNTER — Encounter: Payer: Self-pay | Admitting: Family Medicine

## 2015-01-13 ENCOUNTER — Encounter: Payer: Self-pay | Admitting: Cardiovascular Disease

## 2015-01-18 DIAGNOSIS — H4011X2 Primary open-angle glaucoma, moderate stage: Secondary | ICD-10-CM | POA: Diagnosis not present

## 2015-01-22 ENCOUNTER — Ambulatory Visit (INDEPENDENT_AMBULATORY_CARE_PROVIDER_SITE_OTHER): Payer: Medicare Other | Admitting: Pharmacist Clinician (PhC)/ Clinical Pharmacy Specialist

## 2015-01-22 DIAGNOSIS — Z7901 Long term (current) use of anticoagulants: Secondary | ICD-10-CM

## 2015-01-22 DIAGNOSIS — I480 Paroxysmal atrial fibrillation: Secondary | ICD-10-CM

## 2015-01-22 LAB — POCT INR: INR: 1.9

## 2015-02-12 ENCOUNTER — Ambulatory Visit (INDEPENDENT_AMBULATORY_CARE_PROVIDER_SITE_OTHER): Payer: Medicare Other | Admitting: Pharmacist Clinician (PhC)/ Clinical Pharmacy Specialist

## 2015-02-12 DIAGNOSIS — Z7901 Long term (current) use of anticoagulants: Secondary | ICD-10-CM

## 2015-02-12 DIAGNOSIS — I480 Paroxysmal atrial fibrillation: Secondary | ICD-10-CM

## 2015-02-12 LAB — POCT INR: INR: 2.7

## 2015-02-17 DIAGNOSIS — Z23 Encounter for immunization: Secondary | ICD-10-CM | POA: Diagnosis not present

## 2015-02-18 ENCOUNTER — Encounter: Payer: Self-pay | Admitting: Family Medicine

## 2015-02-23 DIAGNOSIS — Z8546 Personal history of malignant neoplasm of prostate: Secondary | ICD-10-CM | POA: Diagnosis not present

## 2015-03-02 DIAGNOSIS — Z8546 Personal history of malignant neoplasm of prostate: Secondary | ICD-10-CM | POA: Diagnosis not present

## 2015-03-12 ENCOUNTER — Ambulatory Visit (INDEPENDENT_AMBULATORY_CARE_PROVIDER_SITE_OTHER): Payer: Medicare Other | Admitting: Pharmacist Clinician (PhC)/ Clinical Pharmacy Specialist

## 2015-03-12 DIAGNOSIS — Z7901 Long term (current) use of anticoagulants: Secondary | ICD-10-CM

## 2015-03-12 DIAGNOSIS — I480 Paroxysmal atrial fibrillation: Secondary | ICD-10-CM | POA: Diagnosis not present

## 2015-03-12 LAB — POCT INR: INR: 1.8

## 2015-03-19 ENCOUNTER — Telehealth: Payer: Self-pay | Admitting: Family Medicine

## 2015-03-19 NOTE — Telephone Encounter (Signed)
SPOKE WITH PATIENT AND HE IS COMING IN ON October 31 AT 8 AM TO SEE DEBBIE GESSNER TO CLOSE GAPS IN CARE NEEDS FALL SCREENING, UPDATE PNEUMONIA VACCINE AND BMI SCREENING AND FOLLOW UP PLAN.

## 2015-03-22 ENCOUNTER — Encounter: Payer: Self-pay | Admitting: Family Medicine

## 2015-03-22 ENCOUNTER — Ambulatory Visit (INDEPENDENT_AMBULATORY_CARE_PROVIDER_SITE_OTHER): Payer: Medicare Other | Admitting: Family Medicine

## 2015-03-22 VITALS — BP 152/75 | HR 66 | Temp 97.8°F | Resp 16 | Ht 69.0 in | Wt 214.0 lb

## 2015-03-22 DIAGNOSIS — L259 Unspecified contact dermatitis, unspecified cause: Secondary | ICD-10-CM

## 2015-03-22 DIAGNOSIS — R7309 Other abnormal glucose: Secondary | ICD-10-CM

## 2015-03-22 DIAGNOSIS — I10 Essential (primary) hypertension: Secondary | ICD-10-CM | POA: Diagnosis not present

## 2015-03-22 DIAGNOSIS — E785 Hyperlipidemia, unspecified: Secondary | ICD-10-CM

## 2015-03-22 DIAGNOSIS — I25709 Atherosclerosis of coronary artery bypass graft(s), unspecified, with unspecified angina pectoris: Secondary | ICD-10-CM

## 2015-03-22 DIAGNOSIS — R7303 Prediabetes: Secondary | ICD-10-CM | POA: Diagnosis not present

## 2015-03-22 DIAGNOSIS — R739 Hyperglycemia, unspecified: Secondary | ICD-10-CM

## 2015-03-22 LAB — LIPID PANEL
Cholesterol: 197 mg/dL (ref 125–200)
HDL: 28 mg/dL — AB (ref >=40)
LDL Cholesterol: 114 mg/dL (ref <130)
TRIGLYCERIDES: 273 mg/dL — AB (ref <150)
Total CHOL/HDL Ratio: 7 Ratio — ABNORMAL HIGH (ref <=5.0)
VLDL: 55 mg/dL — ABNORMAL HIGH (ref <30)

## 2015-03-22 LAB — COMPREHENSIVE METABOLIC PANEL
ALBUMIN: 4.2 g/dL (ref 3.6–5.1)
ALK PHOS: 49 U/L (ref 40–115)
ALT: 25 U/L (ref 9–46)
AST: 19 U/L (ref 10–35)
BILIRUBIN TOTAL: 0.4 mg/dL (ref 0.2–1.2)
BUN: 15 mg/dL (ref 7–25)
CALCIUM: 9.7 mg/dL (ref 8.6–10.3)
CO2: 25 mmol/L (ref 20–31)
Chloride: 107 mmol/L (ref 98–110)
Creat: 0.85 mg/dL (ref 0.70–1.25)
Glucose, Bld: 116 mg/dL — ABNORMAL HIGH (ref 65–99)
Potassium: 4.2 mmol/L (ref 3.5–5.3)
Sodium: 141 mmol/L (ref 135–146)
Total Protein: 6.5 g/dL (ref 6.1–8.1)

## 2015-03-22 MED ORDER — TRIAMCINOLONE ACETONIDE 0.1 % EX CREA: 1.0000 "application " | TOPICAL_CREAM | Freq: Two times a day (BID) | CUTANEOUS | Status: DC

## 2015-03-22 NOTE — Progress Notes (Signed)
   Subjective:    Patient ID: Rodney Friedman, male    DOB: Jun 20, 1945, 69 y.o.   MRN: [id number]  HPI This is a 69 yo male with multiple medical problems who presents today to follow up prediabetes, HTN. He is on chronic anticoagulation therapy for his atrial fibrillation and has his coumadin managed through cardiology. He has chronic lymphocytic leukemia and sees Dr. Alvy Bimler. He sees urology and has been undergoing lupron treatments and last PSA normal.   He had flu vaccine and Prevnar 13 earlier this month.  He gets occasional contact dermatitis, would like a refill on triamcinalone cream.   Past Medical History  Diagnosis Date  . Diabetes mellitus without complication ([place])   . Leukemia ([place])     CLL  . Prostate cancer ([place])   . Cataract   . Hypertension   . CLL (chronic lymphocytic leukemia) ([place]) 07/08/2013  . Prostate cancer ([place]) 07/08/2013  . Anxiety   . Depression   . Heart murmur   . Myocardial infarction ([place])   . Substance abuse   . Tuberculosis    Past Surgical History  Procedure Laterality Date  . 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 Colonoscopy    . Eye surgery     Family History  Problem Relation Age of Onset  . Cancer Mother     colon cancer  . Cancer Father     prostate ca  . Hypertension Father    Social History  Substance Use Topics  . Smoking status: Current Every Day Smoker -- 0.75 packs/day for 50 years    Types: Cigarettes  . Smokeless tobacco: Never Used  . Alcohol Use: No    Review of Systems No chest pain, no SOB, no edema.    Objective:   Physical Exam Physical Exam  Constitutional: Oriented to person, place, and time. He appears well-developed and well-nourished.  HENT:  Head: Normocephalic and atraumatic.  Eyes: Conjunctivae are normal.  Neck: Normal range of motion. Neck supple.  Cardiovascular: Normal rate, regular rhythm and 3/6 systolic murmur.  Pulmonary/Chest: Effort normal and breath sounds normal.    Musculoskeletal: Normal range of motion.  Neurological: Alert and oriented to person, place, and time.  Skin: Skin is warm and dry.  Psychiatric: Normal mood and affect. Behavior is normal. Judgment and thought content normal.  Vitals reviewed.  BP 152/75 mmHg  Pulse 66  Temp(Src) 97.8 F (36.6 C)  Resp 16  Ht 5\' 9"  (1.753 m)  Wt 214 lb (97.07 kg)  BMI 31.59 kg/m2 Wt Readings from Last 3 Encounters:  03/22/15 214 lb (97.07 kg)  01/04/15 210 lb (95.255 kg)  12/31/14 208 lb (94.348 kg)      Assessment & Plan:  1. Contact dermatitis - triamcinolone cream (KENALOG) 0.1 %; Apply 1 application topically 2 (two) times daily.  Dispense: 30 g; Refill: 1  2. Pre-diabetes - CBC with Differential/Platelet - Lipid panel - Hemoglobin A1c  3. Hyperlipidemia - CBC with Differential/Platelet - Comprehensive metabolic panel - Lipid panel  4. Essential hypertension, benign - CBC with Differential/Platelet - Comprehensive metabolic panel - Lipid panel  5. Elevated blood sugar - Hemoglobin A1c  - follow up 6 months Clarene Reamer, FNP-BC  [facility]  03/22/2015 8:58 AM

## 2015-03-23 LAB — CBC WITH DIFFERENTIAL/PLATELET
BASOS ABS: 0.1 10*3/uL (ref 0.0–0.1)
Basophils Relative: 1 % (ref 0–1)
Eosinophils Absolute: 0.4 10*3/uL (ref 0.0–0.7)
Eosinophils Relative: 3 % (ref 0–5)
HEMATOCRIT: 42.3 % (ref 39.0–52.0)
HEMOGLOBIN: 14.5 g/dL (ref 13.0–17.0)
LYMPHS PCT: 57 % — AB (ref 12–46)
Lymphs Abs: 8.3 10*3/uL — ABNORMAL HIGH (ref 0.7–4.0)
MCH: 31 pg (ref 26.0–34.0)
MCHC: 34.3 g/dL (ref 30.0–36.0)
MCV: 90.6 fL (ref 78.0–100.0)
MONO ABS: 1 10*3/uL (ref 0.1–1.0)
MPV: 10.4 fL (ref 8.6–12.4)
Monocytes Relative: 7 % (ref 3–12)
NEUTROS ABS: 4.7 10*3/uL (ref 1.7–7.7)
Neutrophils Relative %: 32 % — ABNORMAL LOW (ref 43–77)
Platelets: 249 10*3/uL (ref 150–400)
RBC: 4.67 MIL/uL (ref 4.22–5.81)
RDW: 14.1 % (ref 11.5–15.5)
WBC: 14.6 10*3/uL — AB (ref 4.0–10.5)

## 2015-03-23 LAB — HEMOGLOBIN A1C
HEMOGLOBIN A1C: 6.1 % — AB (ref <5.7)
Mean Plasma Glucose: 128 mg/dL — ABNORMAL HIGH (ref <117)

## 2015-03-23 LAB — PATHOLOGIST SMEAR REVIEW

## 2015-03-24 ENCOUNTER — Encounter: Payer: Self-pay | Admitting: Family Medicine

## 2015-04-09 ENCOUNTER — Ambulatory Visit (INDEPENDENT_AMBULATORY_CARE_PROVIDER_SITE_OTHER): Payer: Medicare Other | Admitting: Pharmacist Clinician (PhC)/ Clinical Pharmacy Specialist

## 2015-04-09 DIAGNOSIS — Z7901 Long term (current) use of anticoagulants: Secondary | ICD-10-CM | POA: Diagnosis not present

## 2015-04-09 DIAGNOSIS — I480 Paroxysmal atrial fibrillation: Secondary | ICD-10-CM | POA: Diagnosis not present

## 2015-04-09 LAB — POCT INR: INR: 2.7

## 2015-05-07 ENCOUNTER — Ambulatory Visit (INDEPENDENT_AMBULATORY_CARE_PROVIDER_SITE_OTHER): Payer: Medicare Other | Admitting: Pharmacist Clinician (PhC)/ Clinical Pharmacy Specialist

## 2015-05-07 DIAGNOSIS — I25709 Atherosclerosis of coronary artery bypass graft(s), unspecified, with unspecified angina pectoris: Secondary | ICD-10-CM | POA: Diagnosis not present

## 2015-05-07 DIAGNOSIS — Z7901 Long term (current) use of anticoagulants: Secondary | ICD-10-CM

## 2015-05-07 DIAGNOSIS — I480 Paroxysmal atrial fibrillation: Secondary | ICD-10-CM | POA: Diagnosis not present

## 2015-05-07 LAB — POCT INR: INR: 1.8

## 2015-05-07 MED ORDER — WARFARIN SODIUM 4 MG PO TABS: ORAL_TABLET | ORAL | Status: DC

## 2015-05-12 ENCOUNTER — Other Ambulatory Visit: Payer: Self-pay | Admitting: Family Medicine

## 2015-05-13 NOTE — Telephone Encounter (Signed)
Debbie, you just saw pt for check up and not due to RTC for 6 mos, but I don't see this med discussed recently. OK to RF until he returns, or does he need to come back sooner?

## 2015-06-04 ENCOUNTER — Ambulatory Visit (INDEPENDENT_AMBULATORY_CARE_PROVIDER_SITE_OTHER): Payer: Medicare Other | Admitting: Pharmacist Clinician (PhC)/ Clinical Pharmacy Specialist

## 2015-06-04 DIAGNOSIS — Z7901 Long term (current) use of anticoagulants: Secondary | ICD-10-CM

## 2015-06-04 DIAGNOSIS — I480 Paroxysmal atrial fibrillation: Secondary | ICD-10-CM

## 2015-06-04 LAB — POCT INR: INR: 1.7

## 2015-06-08 ENCOUNTER — Encounter: Payer: Self-pay | Admitting: Family Medicine

## 2015-06-09 ENCOUNTER — Encounter: Payer: Self-pay | Admitting: Family Medicine

## 2015-06-09 ENCOUNTER — Other Ambulatory Visit: Payer: Self-pay | Admitting: Family Medicine

## 2015-06-11 ENCOUNTER — Encounter: Payer: Self-pay | Admitting: Family Medicine

## 2015-06-15 DIAGNOSIS — H401132 Primary open-angle glaucoma, bilateral, moderate stage: Secondary | ICD-10-CM | POA: Diagnosis not present

## 2015-06-15 DIAGNOSIS — H401190 Primary open-angle glaucoma, unspecified eye, stage unspecified: Secondary | ICD-10-CM | POA: Insufficient documentation

## 2015-06-15 DIAGNOSIS — H2511 Age-related nuclear cataract, right eye: Secondary | ICD-10-CM | POA: Diagnosis not present

## 2015-06-15 DIAGNOSIS — Z961 Presence of intraocular lens: Secondary | ICD-10-CM | POA: Diagnosis not present

## 2015-06-15 DIAGNOSIS — H35713 Central serous chorioretinopathy, bilateral: Secondary | ICD-10-CM | POA: Diagnosis not present

## 2015-06-15 DIAGNOSIS — H31011 Macula scars of posterior pole (postinflammatory) (post-traumatic), right eye: Secondary | ICD-10-CM | POA: Diagnosis not present

## 2015-06-16 ENCOUNTER — Encounter: Payer: Self-pay | Admitting: Family Medicine

## 2015-06-16 DIAGNOSIS — H4010X0 Unspecified open-angle glaucoma, stage unspecified: Secondary | ICD-10-CM | POA: Insufficient documentation

## 2015-07-02 ENCOUNTER — Ambulatory Visit (INDEPENDENT_AMBULATORY_CARE_PROVIDER_SITE_OTHER): Payer: Medicare Other | Admitting: Pharmacist Clinician (PhC)/ Clinical Pharmacy Specialist

## 2015-07-02 DIAGNOSIS — I480 Paroxysmal atrial fibrillation: Secondary | ICD-10-CM | POA: Diagnosis not present

## 2015-07-02 DIAGNOSIS — Z7901 Long term (current) use of anticoagulants: Secondary | ICD-10-CM

## 2015-07-02 LAB — POCT INR: INR: 2.7

## 2015-07-11 ENCOUNTER — Other Ambulatory Visit: Payer: Self-pay | Admitting: Family Medicine

## 2015-08-05 DIAGNOSIS — L820 Inflamed seborrheic keratosis: Secondary | ICD-10-CM | POA: Diagnosis not present

## 2015-08-05 DIAGNOSIS — L570 Actinic keratosis: Secondary | ICD-10-CM | POA: Diagnosis not present

## 2015-08-05 DIAGNOSIS — L853 Xerosis cutis: Secondary | ICD-10-CM | POA: Diagnosis not present

## 2015-08-05 DIAGNOSIS — D692 Other nonthrombocytopenic purpura: Secondary | ICD-10-CM | POA: Diagnosis not present

## 2015-08-05 DIAGNOSIS — L723 Sebaceous cyst: Secondary | ICD-10-CM | POA: Diagnosis not present

## 2015-08-05 DIAGNOSIS — L219 Seborrheic dermatitis, unspecified: Secondary | ICD-10-CM | POA: Diagnosis not present

## 2015-08-13 ENCOUNTER — Ambulatory Visit (INDEPENDENT_AMBULATORY_CARE_PROVIDER_SITE_OTHER): Payer: Medicare Other | Admitting: Pharmacist Clinician (PhC)/ Clinical Pharmacy Specialist

## 2015-08-13 DIAGNOSIS — Z7901 Long term (current) use of anticoagulants: Secondary | ICD-10-CM | POA: Diagnosis not present

## 2015-08-13 DIAGNOSIS — I25709 Atherosclerosis of coronary artery bypass graft(s), unspecified, with unspecified angina pectoris: Secondary | ICD-10-CM

## 2015-08-13 DIAGNOSIS — I480 Paroxysmal atrial fibrillation: Secondary | ICD-10-CM

## 2015-08-13 LAB — POCT INR: INR: 4.3

## 2015-08-13 MED ORDER — WARFARIN SODIUM 4 MG PO TABS: ORAL_TABLET | ORAL | Status: DC

## 2015-08-23 DIAGNOSIS — Z8546 Personal history of malignant neoplasm of prostate: Secondary | ICD-10-CM | POA: Diagnosis not present

## 2015-08-31 DIAGNOSIS — Z8546 Personal history of malignant neoplasm of prostate: Secondary | ICD-10-CM | POA: Diagnosis not present

## 2015-09-03 ENCOUNTER — Ambulatory Visit (INDEPENDENT_AMBULATORY_CARE_PROVIDER_SITE_OTHER): Payer: Medicare Other | Admitting: Pharmacist Clinician (PhC)/ Clinical Pharmacy Specialist

## 2015-09-03 DIAGNOSIS — I480 Paroxysmal atrial fibrillation: Secondary | ICD-10-CM

## 2015-09-03 DIAGNOSIS — Z7901 Long term (current) use of anticoagulants: Secondary | ICD-10-CM

## 2015-09-03 LAB — POCT INR: INR: 1.8

## 2015-09-04 ENCOUNTER — Other Ambulatory Visit: Payer: Self-pay | Admitting: Family Medicine

## 2015-09-08 ENCOUNTER — Ambulatory Visit (INDEPENDENT_AMBULATORY_CARE_PROVIDER_SITE_OTHER): Payer: Medicare Other | Admitting: Family Medicine

## 2015-09-08 ENCOUNTER — Encounter: Payer: Self-pay | Admitting: Family Medicine

## 2015-09-08 VITALS — BP 120/72 | HR 59 | Temp 98.3°F | Resp 20 | Ht 69.0 in | Wt 217.0 lb

## 2015-09-08 DIAGNOSIS — I2510 Atherosclerotic heart disease of native coronary artery without angina pectoris: Secondary | ICD-10-CM | POA: Diagnosis not present

## 2015-09-08 DIAGNOSIS — E119 Type 2 diabetes mellitus without complications: Secondary | ICD-10-CM

## 2015-09-08 DIAGNOSIS — Z86010 Personal history of colonic polyps: Secondary | ICD-10-CM | POA: Diagnosis not present

## 2015-09-08 DIAGNOSIS — Z23 Encounter for immunization: Secondary | ICD-10-CM | POA: Diagnosis not present

## 2015-09-08 DIAGNOSIS — Z0000 Encounter for general adult medical examination without abnormal findings: Secondary | ICD-10-CM

## 2015-09-08 DIAGNOSIS — E785 Hyperlipidemia, unspecified: Secondary | ICD-10-CM

## 2015-09-08 DIAGNOSIS — F3130 Bipolar disorder, current episode depressed, mild or moderate severity, unspecified: Secondary | ICD-10-CM

## 2015-09-08 DIAGNOSIS — I10 Essential (primary) hypertension: Secondary | ICD-10-CM

## 2015-09-08 DIAGNOSIS — Z1159 Encounter for screening for other viral diseases: Secondary | ICD-10-CM | POA: Diagnosis not present

## 2015-09-08 DIAGNOSIS — I480 Paroxysmal atrial fibrillation: Secondary | ICD-10-CM | POA: Diagnosis not present

## 2015-09-08 DIAGNOSIS — F319 Bipolar disorder, unspecified: Secondary | ICD-10-CM

## 2015-09-08 DIAGNOSIS — Z114 Encounter for screening for human immunodeficiency virus [HIV]: Secondary | ICD-10-CM | POA: Diagnosis not present

## 2015-09-08 DIAGNOSIS — Z1329 Encounter for screening for other suspected endocrine disorder: Secondary | ICD-10-CM

## 2015-09-08 DIAGNOSIS — R251 Tremor, unspecified: Secondary | ICD-10-CM | POA: Diagnosis not present

## 2015-09-08 LAB — LIPID PANEL
CHOL/HDL RATIO: 5.9 ratio — AB (ref <=5.0)
CHOLESTEROL: 190 mg/dL (ref 125–200)
HDL: 32 mg/dL — ABNORMAL LOW (ref >=40)
LDL Cholesterol: 120 mg/dL (ref <130)
Triglycerides: 189 mg/dL — ABNORMAL HIGH (ref <150)
VLDL: 38 mg/dL — AB (ref <30)

## 2015-09-08 LAB — COMPLETE METABOLIC PANEL WITH GFR
ALBUMIN: 4.3 g/dL (ref 3.6–5.1)
ALK PHOS: 46 U/L (ref 40–115)
ALT: 26 U/L (ref 9–46)
AST: 18 U/L (ref 10–35)
BUN: 12 mg/dL (ref 7–25)
CALCIUM: 9.4 mg/dL (ref 8.6–10.3)
CHLORIDE: 106 mmol/L (ref 98–110)
CO2: 24 mmol/L (ref 20–31)
CREATININE: 1.12 mg/dL (ref 0.70–1.25)
GFR, Est African American: 78 mL/min (ref >=60)
GFR, Est Non African American: 68 mL/min (ref >=60)
GLUCOSE: 80 mg/dL (ref 65–99)
Potassium: 4.5 mmol/L (ref 3.5–5.3)
SODIUM: 142 mmol/L (ref 135–146)
Total Bilirubin: 0.5 mg/dL (ref 0.2–1.2)
Total Protein: 6.9 g/dL (ref 6.1–8.1)

## 2015-09-08 LAB — TSH: TSH: 2.23 m[IU]/L (ref 0.40–4.50)

## 2015-09-08 NOTE — Patient Instructions (Addendum)
IF you received an x-ray today, you will receive an invoice from [facility]. Please contact [facility] at [phone] with questions or concerns regarding your invoice.   IF you received labwork today, you will receive an invoice from [employer]. Please contact [facility] at [phone] with questions or concerns regarding your invoice.   Our billing staff will not be able to assist you with questions regarding bills from these companies.  You will be contacted with the lab results as soon as they are available. The fastest way to get your results is to activate your My Chart account. Instructions are located on the last page of this paperwork. If you have not heard from [place] regarding the results in 2 weeks, please contact this office.    I will refer you to neurology for the tremor, psychiatry to manage Bipolar/depression, and to gastroenterology to discuss colon cancer screening.  Keep follow up with your other specialists.  If fatigue worsens, or other change in symptoms - return to discuss further.   You were given Pneumovax (second pneumonia vaccine) and Tdap (tetanus vaccine) today.   When you are ready to quit smoking - let me know if I can help. [place] offers smoking cessation clinics. Registration is required. To register call [phone] or register online at [email or web address]  Return to the clinic or go to the nearest emergency room if any of your symptoms worsen or new symptoms occur.    Keeping you healthy  Get these tests  Blood pressure- Have your blood pressure checked once a year by your healthcare provider.  Normal blood pressure is 120/80  Weight- Have your body mass index (BMI) calculated to screen for obesity.  BMI is a measure of body fat based on height and weight. You can also calculate your own BMI at [email or web address].[email or web address].  Cholesterol- Have your cholesterol checked every year.  Diabetes- Have your  blood sugar checked regularly if you have high blood pressure, high cholesterol, have a family history of diabetes or if you are overweight.  Screening for Colon Cancer- Colonoscopy starting at age 9.  Screening may begin sooner depending on your family history and other health conditions. Follow up colonoscopy as directed by your Gastroenterologist.  Screening for Prostate Cancer- Both blood work (PSA) and a rectal exam help screen for Prostate Cancer.  Screening begins at age 76 with African-American men and at age 17 with Caucasian men.  Screening may begin sooner depending on your family history.  Take these medicines  Aspirin- One aspirin daily can help prevent Heart disease and Stroke.  Flu shot- Every fall.  Tetanus- Every 10 years.  Zostavax- Once after the age of 45 to prevent Shingles.  Pneumonia shot- Once after the age of 71; if you are younger than 86, ask your healthcare provider if you need a Pneumonia shot.  Take these steps  Don't smoke- If you do smoke, talk to your doctor about quitting.  For tips on how to quit, go to www.smokefree.gov or call 1-800-QUIT-NOW.  Be physically active- Exercise 5 days a week for at least 30 minutes.  If you are not already physically active start slow and gradually work up to 30 minutes of moderate physical activity.  Examples of moderate activity include walking briskly, mowing the yard, dancing, swimming, bicycling, etc.  Eat a healthy diet- Eat a variety of healthy food such as fruits, vegetables, low fat milk, low fat cheese, yogurt, lean meant, poultry, fish, beans, tofu, etc.  For more information go to www.thenutritionsource.org  Drink alcohol in moderation- Limit alcohol intake to less than two drinks a day. Never drink and drive.  Dentist- Brush and floss twice daily; visit your dentist twice a year.  Depression- Your emotional health is as important as your physical health. If you're feeling down, or losing interest in things  you would normally enjoy please talk to your healthcare provider.  Eye exam- Visit your eye doctor every year.  Safe sex- If you may be exposed to a sexually transmitted infection, use a condom.  Seat belts- Seat belts can save your life; always wear one.  Smoke/Carbon Monoxide detectors- These detectors need to be installed on the appropriate level of your home.  Replace batteries at least once a year.  Skin cancer- When out in the sun, cover up and use sunscreen 15 SPF or higher.  Violence- If anyone is threatening you, please tell your healthcare provider.  Living Will/ Health care power of attorney- Speak with your healthcare provider and family.

## 2015-09-08 NOTE — Progress Notes (Signed)
   Subjective:    Patient ID: Rodney Friedman, male    DOB: 08/24/1945, 70 y.o.   MRN: [id number]  HPI    Review of Systems  Constitutional: Positive for fatigue.  HENT: Negative.   Eyes: Negative.   Respiratory: Negative.   Cardiovascular: Positive for palpitations.  Gastrointestinal: Negative.   Endocrine: Negative.   Genitourinary: Positive for frequency.  Musculoskeletal: Negative.   Skin: Negative.   Allergic/Immunologic: Negative.   Neurological: Positive for tremors.  Hematological: Negative.   Psychiatric/Behavioral: Negative.        Objective:   Physical Exam        Assessment & Plan:

## 2015-09-08 NOTE — Progress Notes (Signed)
By signing my name below, I, Mesha Guinyard, attest that this documentation has been prepared under the direction and in the presence of Corliss Parish, MD.  Electronically Signed: Verlee Monte, Medical Scribe. 09/08/2015. 1:37 PM.  Subjective:    Patient ID: Rodney Friedman, male    DOB: Dec 04, 1945, 70 y.o.   MRN: [id number]  HPI Chief Complaint  Patient presents with  . Annual Exam  . per pt already being treated for depression   HPI Comments: Rodney Friedman is a 70 y.o. male who presents to the [facility] for annual medicare physical. He's a former pt of Dr. Lorelei Pont. He has a hx of CAD paroxysmal A.Fib; takes coumadin, CLL, glaucoma, HTN.  He spoke of concern for a persistent tremor that started about 2-3 years about. He explains it occurs during rest, but never drops things. He saw a neurologist about a year ago for neuropathy. He denies weakness in extremities, chest pains, palpitations, change in grip strength, changes in vision, change in his walking/gait.  He also mentions fatigue that started years ago and think thinks it could be due to his previous cancer and medications.  Hep C/HIV Screening: Has no had hep C, or HIV test.    Diabetes Diet Controled: Has been diagnosed with DM2. Followed at [facility].  HTN, A. Fib, CAD: Followed by Dr. Sallyanne Kuster, his cardiologist. Last visit was Aug 2016. 3 vessel bypass in 2006. A. Fib 2014. 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 Plan for 1 year follow-up for cardiology. He did have a carotid doppler: mild plaque build up, no real obstruction. He take Toprol 100 mg QD for his BP. He says there has been no changes in his symptoms; no chest pains, or heart palpitations.  Tobacco Use:  He's still a smoker.  Hyperlipidemia: He continues on 80 mg of pravastatin. He has apt in September.  Cancer Screening: Colon Cancer Screening- Had colonoscopy 2014 with  14 polyps noted. They told him to recheck in about 2017 or 2018. Prostate Cancer Screening- Has hx of prostate cancer. 2014 radiation therapy followed by [facility]. Urologist Dr. Karsten Ro, just saw them 8 days ago. Oncologist Dr. Alvy Bimler due in Aug. He saw his urologist 8 days ago. Hx of CLL followed by Dr. Alvy Bimler . Stable in Aug 2016. Plan on yearly follow-up with Dr. Alvy Bimler.  Immunology: Appears he his due for pneumovax . Hx of pneumonia 4 years ago. 10+ years since his last tetanus shot. Immunization History  Administered Date(s) Administered  . Influenza Split 02/17/2015  . Pneumococcal Conjugate-13 02/24/2014  . Zoster 05/22/2013   Bi-Polar & Depression: Currently under treatment for depression. He doesn't see anyone for his medication. Depression screen [facility] 2/9 09/08/2015 03/22/2015  Decreased Interest 0 0  Down, Depressed, Hopeless 0 0  PHQ - 2 Score 0 0   Fall Screening: He answered yes.  Functional Status Survey: No pos responses  Vision: Followed-up in [facility] for his vision.  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  Exercise: He doesn't lift weights like he used to. Less active now with fatigue.  Advance Directives: He has living will and plans on  sending copy here.  Patient Active Problem List   Diagnosis Date Noted  . Glaucoma, open angle 06/16/2015  . 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  . Pseudoaphakia 06/15/2015  . CD (contact dermatitis) 04/29/2014  . 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 Hematuria 01/05/2014  . Skin lesion 01/05/2014  . Mild aortic valve stenosis 01/03/2014  . Hypertensive heart disease 01/03/2014  . Hyperlipidemia 01/03/2014  . Mild obesity 01/03/2014  . Tobacco abuse 01/03/2014  . Atrial fibrillation ([place]) 12/31/2013  . Long  term current use of anticoagulant therapy 12/31/2013  . Cataract, nuclear 10/06/2013  . Dermatochalasis of eyelid 10/06/2013  . 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 CAD (coronary artery disease) 09/03/2013  . Pre-diabetes 09/03/2013  . 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 Prostate cancer ([place]) 07/08/2013   Past Medical History  Diagnosis Date  . Diabetes mellitus without complication ([place])   . Leukemia ([place])     CLL  . Prostate cancer ([place])   . Cataract   . Hypertension   . 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 Prostate cancer ([place]) 07/08/2013  . Anxiety   . Depression   . Heart murmur   . Myocardial infarction ([place])   . Substance abuse   . Glaucoma    Past Surgical History  Procedure Laterality Date  . 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 Colonoscopy    . Eye surgery     No Known Allergies Prior to Admission medications   Medication Sig Start Date End Date Taking? Authorizing Provider  amlodipine (NORVASC) 10 MG tablet TAKE 1 TABLET BY MOUTH EVERY DAY 07/11/15   Darreld Mclean, MD  B Complex Vitamins (VITAMIN-B COMPLEX PO) Take by mouth.    Historical Provider, MD  BIOTIN PO Take by mouth.    Historical Provider, MD  buPROPion (WELLBUTRIN SR) 150 MG 12 hr tablet Take 1 tablet (150 mg total) by mouth 2 (two) times daily. 10/08/14   Posey Boyer, MD  Calcium Carbonate-Vit D-Min (CALCIUM 1200 PO) Take 1,200 mg by mouth daily.    Historical Provider, MD  lamoTRIgine (LAMICTAL) 100 MG tablet TAKE 1 TABLET BY MOUTH 3 TIMES A DAY 05/14/15   Elby Beck, FNP  latanoprost (XALATAN) 0.005 % ophthalmic solution Once a day every third day 06/30/13   Historical Provider, MD  metFORMIN (GLUCOPHAGE) 500 MG tablet TAKE 1 TABLET TWICE A DAY. 10/08/14   Posey Boyer, MD  metoprolol succinate (TOPROL-XL) 100 MG 24 hr tablet Take 1 daily 10/08/14   Posey Boyer, MD  pravastatin (PRAVACHOL) 80 MG tablet Take 1 tablet (80 mg total) by mouth  daily. 10/08/14   Posey Boyer, MD  triamcinolone cream (KENALOG) 0.1 % Apply 1 application topically 2 (two) times daily. 03/22/15   Elby Beck, FNP  UNABLE TO FIND Med Name:  Glycopene    Historical Provider, MD  valsartan (DIOVAN) 320 MG tablet Take 1 tablet (320 mg total) by mouth daily. 10/08/14   Posey Boyer, MD  vitamin C (ASCORBIC ACID) 500 MG tablet Take 500 mg by mouth daily.    Historical Provider, MD  warfarin (COUMADIN) 4 MG tablet Take 1 to 1.5 tablets by mouth daily as directed by coumadin clinic 08/13/15   Sanda Klein, MD   Social History   Social History  . Marital Status: Divorced    Spouse Name: N/A  . Number of Children: N/A  . Years of Education: N/A   Occupational History  .  Not on file.   Social History Main Topics  . Smoking status: Current Every Day Smoker -- 0.75 packs/day for 50 years    Types: Cigarettes  . Smokeless tobacco: Never Used  . Alcohol Use: No  . Drug Use: No  . Sexual Activity: Not on file   Other Topics Concern  . Not on file   Social History Narrative   Review of Systems  Constitutional: Positive for fatigue.  Eyes: Negative for visual disturbance (neg for new visual changes).  Cardiovascular: Positive for palpitations (hx of A. Fib, no recent palpitations). Negative for chest pain.  Gastrointestinal: Negative for diarrhea and constipation.  Genitourinary: Positive for frequency.  Neurological: Positive for tremors. Negative for weakness.   13. ROS reviewed pos for fatigue, palpitation or irregular heartbeat , urinary frequency, and tremors Objective:   Physical Exam  Constitutional: He is oriented to person, place, and time. He appears well-developed and well-nourished.  HENT:  Head: Normocephalic and atraumatic.  Right Ear: External ear normal.  Left Ear: External ear normal.  Mouth/Throat: Oropharynx is clear and moist.  Eyes: Conjunctivae and EOM are normal. Pupils are equal, round, and reactive to light.    Neck: Normal range of motion. Neck supple. No JVD present. Carotid bruit is not present. No thyromegaly present.  Cardiovascular: Normal rate, regular rhythm, normal heart sounds and intact distal pulses.   No murmur heard. Pulmonary/Chest: Effort normal and breath sounds normal. No respiratory distress. He has no wheezes. He has no rales.  Abdominal: Soft. He exhibits no distension. There is no tenderness. Hernia confirmed negative in the right inguinal area and confirmed negative in the left inguinal area.  Musculoskeletal: Normal range of motion. He exhibits no edema or tenderness.  Lymphadenopathy:    He has no cervical adenopathy.  Neurological: He is alert and oriented to person, place, and time. He has normal reflexes.  Fine termor of bilateral hand with arm elevation Minimal tremor of thumb at rest Minimal wavering on finger to nose, but no past pointing  Skin: Skin is warm and dry.  Psychiatric: He has a normal mood and affect. His behavior is normal.  Vitals reviewed.      Assessment & Plan:  Rodney Friedman is a 70 y.o. male Medicare annual wellness visit, subsequent  -anticipatory guidance as below in AVS, screening labs above. Health maintenance items as above in HPI discussed/recommended as applicable.   Tremor - Plan: Ambulatory referral to Neurology, TSH  -Essential tremor possible. Refer to neuro for further evaluation. Check tsh.  RTC precautions if worse  Paroxysmal a-fib (HCC)  -stable. Continue follow-up with cardiologist.  Essential hypertension  - stable. No current med changes.  Hyperlipidemia - Plan: COMPLETE METABOLIC PANEL WITH GFR, Lipid panel  - check labs. Continue Pravachol 80 mg daily, follow-up is planned with cardiologist.  Coronary artery disease involving native coronary artery of native heart without angina pectoris  -As above, asymptomatic currently. Continue routine follow-up with cardiologist.  Screening for thyroid disorder - Plan:  TSH   History of colonic polyps - Plan: Ambulatory referral to Gastroenterology  -May be due for repeat colonoscopy. We'll refer to Gastroenterologist locally.  Need for pneumococcal vaccination - Plan: Pneumococcal polysaccharide vaccine 23-valent greater than or equal to 2yo subcutaneous/IM  Need for Tdap vaccination - Plan: Tdap vaccine greater than or equal to 7yo IM  Diabetes mellitus type 2, diet-controlled ([place]) - Plan: HM Diabetes Foot Exam  -Continue routine follow-up with endocrinologist at [place].  Bipolar depression (  [place]) - Plan: Ambulatory referral to Psychiatry  -Referral to psychiatry to help manage medications. If refills needed prior to that visit, discussed I can provide a short-term supply.   Need for hepatitis C screening test - Plan: Hepatitis C antibody  Screening for HIV (human immunodeficiency virus) - Plan: HIV antibody  Tobacco abuse. Cessation recommended, advised to let me know if assistance needed in quitting.  No orders of the defined types were placed in this encounter.   Patient Instructions       IF you received an x-ray today, you will receive an invoice from [facility]. Please contact [facility] at [phone] with questions or concerns regarding your invoice.   IF you received labwork today, you will receive an invoice from [employer]. Please contact [facility] at [phone] with questions or concerns regarding your invoice.   Our billing staff will not be able to assist you with questions regarding bills from these companies.  You will be contacted with the lab results as soon as they are available. The fastest way to get your results is to activate your My Chart account. Instructions are located on the last page of this paperwork. If you have not heard from [place] regarding the results in 2 weeks, please contact this office.    I will refer you to neurology for the tremor, psychiatry to  manage Bipolar/depression, and to gastroenterology to discuss colon cancer screening.  Keep follow up with your other specialists.  If fatigue worsens, or other change in symptoms - return to discuss further.   You were given Pneumovax (second pneumonia vaccine) and Tdap (tetanus vaccine) today.   When you are ready to quit smoking - let me know if I can help. [place] offers smoking cessation clinics. Registration is required. To register call [phone] or register online at [email or web address]  Return to the clinic or go to the nearest emergency room if any of your symptoms worsen or new symptoms occur.    Keeping you healthy  Get these tests  Blood pressure- Have your blood pressure checked once a year by your healthcare provider.  Normal blood pressure is 120/80  Weight- Have your body mass index (BMI) calculated to screen for obesity.  BMI is a measure of body fat based on height and weight. You can also calculate your own BMI at [email or web address].  Cholesterol- Have your cholesterol checked every year.  Diabetes- Have your blood sugar checked regularly if you have high blood pressure, high cholesterol, have a family history of diabetes or if you are overweight.  Screening for Colon Cancer- Colonoscopy starting at age 14.  Screening may begin sooner depending on your family history and other health conditions. Follow up colonoscopy as directed by your Gastroenterologist.  Screening for Prostate Cancer- Both blood work (PSA) and a rectal exam help screen for Prostate Cancer.  Screening begins at age 17 with African-American men and at age 41 with Caucasian men.  Screening may begin sooner depending on your family history.  Take these medicines  Aspirin- One aspirin daily can help prevent Heart disease and Stroke.  Flu shot- Every fall.  Tetanus- Every 10 years.  Zostavax- Once after the age of 25 to prevent Shingles.  Pneumonia shot- Once after the age of 1; if  you are younger than 16, ask your healthcare provider if you need a Pneumonia shot.  Take these steps  Don't smoke- If you do smoke, talk to your doctor about quitting.  For tips on  how to quit, go to www.smokefree.gov or call 1-800-QUIT-NOW.  Be physically active- Exercise 5 days a week for at least 30 minutes.  If you are not already physically active start slow and gradually work up to 30 minutes of moderate physical activity.  Examples of moderate activity include walking briskly, mowing the yard, dancing, swimming, bicycling, etc.  Eat a healthy diet- Eat a variety of healthy food such as fruits, vegetables, low fat milk, low fat cheese, yogurt, lean meant, poultry, fish, beans, tofu, etc. For more information go to www.thenutritionsource.org  Drink alcohol in moderation- Limit alcohol intake to less than two drinks a day. Never drink and drive.  Dentist- Brush and floss twice daily; visit your dentist twice a year.  Depression- Your emotional health is as important as your physical health. If you're feeling down, or losing interest in things you would normally enjoy please talk to your healthcare provider.  Eye exam- Visit your eye doctor every year.  Safe sex- If you may be exposed to a sexually transmitted infection, use a condom.  Seat belts- Seat belts can save your life; always wear one.  Smoke/Carbon Monoxide detectors- These detectors need to be installed on the appropriate level of your home.  Replace batteries at least once a year.  Skin cancer- When out in the sun, cover up and use sunscreen 15 SPF or higher.  Violence- If anyone is threatening you, please tell your healthcare provider.  Living Will/ Health care power of attorney- Speak with your healthcare provider and family.    I personally performed the services described in this documentation, which was scribed in my presence. The recorded information has been reviewed and considered, and addended by me as needed.

## 2015-09-09 LAB — HEPATITIS C ANTIBODY: HCV AB: NEGATIVE

## 2015-09-09 LAB — HIV ANTIBODY (ROUTINE TESTING W REFLEX): HIV: NONREACTIVE

## 2015-09-15 ENCOUNTER — Encounter: Payer: Medicare Other | Admitting: Family Medicine

## 2015-09-15 ENCOUNTER — Ambulatory Visit: Payer: Medicare Other | Admitting: Family Medicine

## 2015-09-22 ENCOUNTER — Encounter: Payer: Medicare Other | Admitting: Physician Assistant

## 2015-09-22 ENCOUNTER — Encounter: Payer: Self-pay | Admitting: Neurology

## 2015-09-22 ENCOUNTER — Ambulatory Visit (INDEPENDENT_AMBULATORY_CARE_PROVIDER_SITE_OTHER): Payer: Medicare Other | Admitting: Neurology

## 2015-09-22 VITALS — BP 142/76 | HR 62 | Ht 69.0 in | Wt 219.5 lb

## 2015-09-22 DIAGNOSIS — I25709 Atherosclerosis of coronary artery bypass graft(s), unspecified, with unspecified angina pectoris: Secondary | ICD-10-CM

## 2015-09-22 DIAGNOSIS — G250 Essential tremor: Secondary | ICD-10-CM

## 2015-09-22 HISTORY — DX: Essential tremor: G25.0

## 2015-09-22 NOTE — Patient Instructions (Signed)
Tremor °A tremor is trembling or shaking that you cannot control. Most tremors affect the hands or arms. Tremors can also affect the head, vocal cords, face, and other parts of the body. There are many types of tremors. Common types include:  °· Essential tremor. These usually occur in people over the age of 40. It may run in families and can happen in otherwise healthy people.   °· Resting tremor. These occur when the muscles are at rest, such as when your hands are resting in your lap. People with Parkinson disease often have resting tremors.   °· Postural tremor. These occur when you try to hold a pose, such as keeping your hands outstretched.   °· Kinetic tremor. These occur during purposeful movement, such as trying to touch a finger to your nose.   °· Task-specific tremor. These may occur when you perform tasks such as handwriting, speaking, or standing.   °· Psychogenic tremor. These dramatically lessen or disappear when you are distracted. They can happen in people of all ages.   °Some types of tremors have no known cause. Tremors can also be a symptom of nervous system problems (neurological disorders) that may occur with aging. Some tremors go away with treatment while others do not.  °HOME CARE INSTRUCTIONS °Watch your tremor for any changes. The following actions may help to lessen any discomfort you are feeling: °· Take medicines only as directed by your health care provider.   °· Limit alcohol intake to no more than 1 drink per day for nonpregnant women and 2 drinks per day for men. One drink equals 12 oz of beer, 5 oz of wine, or 1½ oz of hard liquor. °· Do not use any tobacco products, including cigarettes, chewing tobacco, or electronic cigarettes. If you need help quitting, ask your health care provider.   °· Avoid extreme heat or cold.    °· Limit the amount of caffeine you consume as directed by your health care provider.   °· Try to get 8 hours of sleep each night. °· Find ways to manage your  stress, such as meditation or yoga. °· Keep all follow-up visits as directed by your health care provider. This is important. °SEEK MEDICAL CARE IF: °· You start having a tremor after starting a new medicine. °· You have tremor with other symptoms such as: °¨ Numbness. °¨ Tingling. °¨ Pain. °¨ Weakness. °· Your tremor gets worse. °· Your tremor interferes with your day-to-day life. °  °This information is not intended to replace advice given to you by your health care provider. Make sure you discuss any questions you have with your health care provider. °  °Document Released: 04/28/2002 Document Revised: 05/29/2014 Document Reviewed: 11/03/2013 °Elsevier Interactive Patient Education ©2016 [employer]. ° °

## 2015-09-22 NOTE — Progress Notes (Signed)
Reason for visit: Tremor  Referring physician: Dr. Lucas Mallow Spade is a 70 y.o. male  History of present illness:  Mr. Fazzolari is a 70 year old right-handed white male with a history of tremor that dates back about 6 years. The patient has a history of diabetes that is under relatively good control. He indicates that the tremor has affected both hands equally, and does affect his handwriting and his ability to feed himself. Activities that require fine motor control are affected. He denies any significant variations in severity the tremor from one day to the next. He works on [employer] and he has a lot of difficulty performing this task. He in general has not had to give up any activities because of the tremor. He believes that there has been very little progression of the tremor over the last 6 years. He also has noted an occasional tremor affecting the jaw. He denies any vocal tremor. The patient denies any issues with balance or falls. He does not have any weakness of extremities or difficulty controlling the bowels or the bladder. He indicates that his elderly father who is 24 also has a slight tremor. The patient is an only child.  Past Medical History  Diagnosis Date  . Diabetes mellitus without complication ([place])   . Leukemia ([place])     CLL  . Prostate cancer ([place])   . Cataract   . Hypertension   . 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 Prostate cancer ([place]) 07/08/2013  . Anxiety   . Depression   . Heart murmur   . Myocardial infarction ([place])   . Substance abuse   . Glaucoma   . Eye abnormality     Macular scarring R eye  . Tremor, essential 09/22/2015    Past Surgical History  Procedure Laterality Date  . 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 Colonoscopy    . Eye surgery      Family History  Problem Relation Age of Onset  . Cancer Mother     colon cancer  . Cancer Father     prostate ca  . Hypertension Father     Social  history:  reports that he has been smoking Cigarettes.  He has a 37.5 pack-year smoking history. He has never used smokeless tobacco. He reports that he does not drink alcohol or use illicit drugs.  Medications:  Prior to Admission medications   Medication Sig Start Date End Date Taking? Authorizing Provider  amLODipine (NORVASC) 10 MG tablet TAKE 1 TABLET BY MOUTH EVERY DAY 07/11/15  Yes Gay Filler Copland, MD  B Complex Vitamins (VITAMIN-B COMPLEX PO) Take by mouth.   Yes Historical Provider, MD  BIOTIN PO Take by mouth.   Yes Historical Provider, MD  buPROPion (WELLBUTRIN SR) 150 MG 12 hr tablet Take 1 tablet (150 mg total) by mouth 2 (two) times daily. 10/08/14  Yes Posey Boyer, MD  Calcium Carbonate-Vit D-Min (CALCIUM 1200 PO) Take 1,200 mg by mouth daily.   Yes Historical Provider, MD  lamoTRIgine (LAMICTAL) 100 MG tablet TAKE 1 TABLET BY MOUTH 3 TIMES A DAY 05/14/15  Yes Elby Beck, FNP  latanoprost (XALATAN) 0.005 % ophthalmic solution Once a day every third day 06/30/13  Yes Historical Provider, MD  LYCOPENE PO Take 8 mg by mouth daily.   Yes Historical Provider, MD  metFORMIN (GLUCOPHAGE) 500 MG tablet TAKE 1 TABLET TWICE A DAY. 10/08/14  Yes Posey Boyer, MD  metoprolol  succinate (TOPROL-XL) 100 MG 24 hr tablet Take 1 daily 10/08/14  Yes Posey Boyer, MD  pravastatin (PRAVACHOL) 80 MG tablet Take 1 tablet (80 mg total) by mouth daily. 10/08/14  Yes Posey Boyer, MD  valsartan (DIOVAN) 320 MG tablet Take 1 tablet (320 mg total) by mouth daily. 10/08/14  Yes Posey Boyer, MD  vitamin C (ASCORBIC ACID) 500 MG tablet Take 500 mg by mouth daily.   Yes Historical Provider, MD  warfarin (COUMADIN) 4 MG tablet Take 1 to 1.5 tablets by mouth daily as directed by coumadin clinic 08/13/15  Yes Mihai Croitoru, MD  polyethylene glycol powder (GLYCOLAX/MIRALAX) powder Reported on 09/22/2015 09/20/15   Historical Provider, MD     No Known Allergies  ROS:  Out of a complete 14 system review  of symptoms, the patient complains only of the following symptoms, and all other reviewed systems are negative.  Weight gain Heart murmur Easy bruising, easy bleeding Tremor Depression, anxiety Impotence Sleepiness  Blood pressure 142/76, pulse 62, height 5\' 9"  (1.753 m), weight 219 lb 8 oz (99.565 kg).  Physical Exam  General: The patient is alert and cooperative at the time of the examination. The patient is moderately obese.  Eyes: Pupils are equal, round, and reactive to light. Discs are flat bilaterally.  Neck: The neck is supple, no carotid bruits are noted.  Respiratory: The respiratory examination is clear.  Cardiovascular: The cardiovascular examination reveals a regular rate and rhythm, no obvious murmurs or rubs are noted.  Skin: Extremities are without significant edema.  Neurologic Exam  Mental status: The patient is alert and oriented x 3 at the time of the examination. The patient has apparent normal recent and remote memory, with an apparently normal attention span and concentration ability.  Cranial nerves: Facial symmetry is present. There is good sensation of the face to pinprick and soft touch bilaterally. The strength of the facial muscles and the muscles to head turning and shoulder shrug are normal bilaterally. Speech is well enunciated, no aphasia or dysarthria is noted. Extraocular movements are full. Visual fields are full. The tongue is midline, and the patient has symmetric elevation of the soft palate. No obvious hearing deficits are noted.  Motor: The motor testing reveals 5 over 5 strength of all 4 extremities. Good symmetric motor tone is noted throughout.  Sensory: Sensory testing is intact to pinprick, soft touch, vibration sensation, and position sense on all 4 extremities, with exception of some impairment of position sense in both feet. No evidence of extinction is noted.  Coordination: Cerebellar testing reveals good finger-nose-finger and  heel-to-shin bilaterally. The patient does have a mild intention tremor with finger-nose-finger bilaterally.  Gait and station: Gait is normal. Tandem gait is slightly unsteady. Romberg is negative. No drift is seen.  Reflexes: Deep tendon reflexes are symmetric, but are depressed bilaterally. Toes are downgoing bilaterally.   Assessment/Plan:  1. Essential tremor  The patient appears to have a mild essential tremor affecting both upper extremities. Currently, he does not wish to go on any medications for the tremor. The tremor may gradually worsened over time, he may call our office at any point if he desires to engage in medical management. The patient does not have signs of parkinsonism. He will follow-up through this office on an as-needed basis. A thyroid profile has been done recently and was unremarkable.  Jill Alexanders MD 09/22/2015 8:41 PM  [facility] [address] [place], [place] [address]  Phone [phone] Fax [phone]  

## 2015-09-29 ENCOUNTER — Other Ambulatory Visit: Payer: Self-pay | Admitting: Family Medicine

## 2015-10-01 ENCOUNTER — Ambulatory Visit (INDEPENDENT_AMBULATORY_CARE_PROVIDER_SITE_OTHER): Payer: Medicare Other | Admitting: Pharmacist Clinician (PhC)/ Clinical Pharmacy Specialist

## 2015-10-01 DIAGNOSIS — I480 Paroxysmal atrial fibrillation: Secondary | ICD-10-CM

## 2015-10-01 DIAGNOSIS — Z7901 Long term (current) use of anticoagulants: Secondary | ICD-10-CM

## 2015-10-01 LAB — POCT INR: INR: 2

## 2015-10-14 ENCOUNTER — Encounter: Payer: Self-pay | Admitting: Family Medicine

## 2015-10-17 ENCOUNTER — Other Ambulatory Visit: Payer: Self-pay | Admitting: Family Medicine

## 2015-10-26 ENCOUNTER — Encounter: Payer: Self-pay | Admitting: Family Medicine

## 2015-10-26 ENCOUNTER — Other Ambulatory Visit: Payer: Self-pay | Admitting: Family Medicine

## 2015-10-27 ENCOUNTER — Other Ambulatory Visit: Payer: Self-pay | Admitting: Family Medicine

## 2015-10-27 ENCOUNTER — Other Ambulatory Visit: Payer: Self-pay

## 2015-10-27 MED ORDER — LAMOTRIGINE 100 MG PO TABS: 100.0000 mg | ORAL_TABLET | Freq: Three times a day (TID) | ORAL | Status: DC

## 2015-10-30 ENCOUNTER — Encounter: Payer: Self-pay | Admitting: Family Medicine

## 2015-11-08 ENCOUNTER — Other Ambulatory Visit: Payer: Self-pay | Admitting: Emergency Medicine

## 2015-11-08 MED ORDER — AMLODIPINE BESYLATE 10 MG PO TABS: 10.0000 mg | ORAL_TABLET | Freq: Every day | ORAL | Status: DC

## 2015-11-10 ENCOUNTER — Encounter: Payer: Self-pay | Admitting: Family Medicine

## 2015-11-12 ENCOUNTER — Other Ambulatory Visit: Payer: Self-pay | Admitting: Family Medicine

## 2015-11-17 ENCOUNTER — Other Ambulatory Visit: Payer: Self-pay | Admitting: Emergency Medicine

## 2015-11-17 ENCOUNTER — Telehealth: Payer: Self-pay

## 2015-11-17 MED ORDER — POLYETHYLENE GLYCOL 3350 17 GM/SCOOP PO POWD: 17.0000 g | Freq: Every day | ORAL | Status: DC

## 2015-11-17 NOTE — Telephone Encounter (Signed)
This medication was not originally prescribed by [place]. Can we refill?

## 2015-11-17 NOTE — Telephone Encounter (Signed)
[facility] CALLED REQUESTING A REFILL OF polyethylene glycol powder (GLYCOLAX/MIRALAX) powder

## 2015-11-17 NOTE — Telephone Encounter (Signed)
It appears that Dr. Lorelei Pont prescribed this for him. She gave him refills so he can just go pick it up at the pharmacy. Please let him know that she no longer works here and is now with [employer]. He saw Dr. Carlota Raspberry for his annual physical in 08/2015. It's okay to fill this for him but he should have refills already.

## 2015-11-19 ENCOUNTER — Encounter: Payer: Self-pay | Admitting: Family Medicine

## 2015-11-26 ENCOUNTER — Ambulatory Visit (INDEPENDENT_AMBULATORY_CARE_PROVIDER_SITE_OTHER): Payer: Medicare Other | Admitting: Pharmacist

## 2015-11-26 DIAGNOSIS — I480 Paroxysmal atrial fibrillation: Secondary | ICD-10-CM | POA: Diagnosis not present

## 2015-11-26 DIAGNOSIS — Z7901 Long term (current) use of anticoagulants: Secondary | ICD-10-CM | POA: Diagnosis not present

## 2015-11-26 LAB — POCT INR: INR: 2.8

## 2015-12-15 ENCOUNTER — Other Ambulatory Visit: Payer: Self-pay | Admitting: Family Medicine

## 2015-12-15 DIAGNOSIS — F3162 Bipolar disorder, current episode mixed, moderate: Secondary | ICD-10-CM

## 2015-12-17 DIAGNOSIS — H401122 Primary open-angle glaucoma, left eye, moderate stage: Secondary | ICD-10-CM | POA: Diagnosis not present

## 2015-12-22 DIAGNOSIS — H401114 Primary open-angle glaucoma, right eye, indeterminate stage: Secondary | ICD-10-CM | POA: Diagnosis not present

## 2015-12-22 DIAGNOSIS — H401122 Primary open-angle glaucoma, left eye, moderate stage: Secondary | ICD-10-CM | POA: Diagnosis not present

## 2015-12-31 ENCOUNTER — Encounter: Payer: Self-pay | Admitting: Cardiovascular Disease

## 2015-12-31 ENCOUNTER — Ambulatory Visit (INDEPENDENT_AMBULATORY_CARE_PROVIDER_SITE_OTHER): Payer: Medicare Other | Admitting: Pharmacist

## 2015-12-31 ENCOUNTER — Ambulatory Visit (INDEPENDENT_AMBULATORY_CARE_PROVIDER_SITE_OTHER): Payer: Medicare Other | Admitting: Cardiovascular Disease

## 2015-12-31 VITALS — BP 126/62 | HR 84 | Ht 69.0 in | Wt 218.6 lb

## 2015-12-31 DIAGNOSIS — I25708 Atherosclerosis of coronary artery bypass graft(s), unspecified, with other forms of angina pectoris: Secondary | ICD-10-CM | POA: Diagnosis not present

## 2015-12-31 DIAGNOSIS — E785 Hyperlipidemia, unspecified: Secondary | ICD-10-CM | POA: Diagnosis not present

## 2015-12-31 DIAGNOSIS — I25709 Atherosclerosis of coronary artery bypass graft(s), unspecified, with unspecified angina pectoris: Secondary | ICD-10-CM

## 2015-12-31 DIAGNOSIS — Z79899 Other long term (current) drug therapy: Secondary | ICD-10-CM

## 2015-12-31 DIAGNOSIS — Z7901 Long term (current) use of anticoagulants: Secondary | ICD-10-CM

## 2015-12-31 DIAGNOSIS — I480 Paroxysmal atrial fibrillation: Secondary | ICD-10-CM

## 2015-12-31 DIAGNOSIS — C9110 Chronic lymphocytic leukemia of B-cell type not having achieved remission: Secondary | ICD-10-CM

## 2015-12-31 DIAGNOSIS — R7303 Prediabetes: Secondary | ICD-10-CM

## 2015-12-31 DIAGNOSIS — R0989 Other specified symptoms and signs involving the circulatory and respiratory systems: Secondary | ICD-10-CM

## 2015-12-31 DIAGNOSIS — I350 Nonrheumatic aortic (valve) stenosis: Secondary | ICD-10-CM

## 2015-12-31 DIAGNOSIS — Z720 Tobacco use: Secondary | ICD-10-CM

## 2015-12-31 LAB — POCT INR: INR: 1.4

## 2015-12-31 MED ORDER — APIXABAN 5 MG PO TABS: 5.0000 mg | ORAL_TABLET | Freq: Two times a day (BID) | ORAL | 3 refills | Status: DC

## 2015-12-31 NOTE — Progress Notes (Signed)
Cardiology Office Note    Date:  01/02/2016   ID:  Rodney Friedman, DOB 23-Jun-1945, MRN [id number]  PCP:  Wendie Agreste, MD  Cardiologist:   Sanda Klein, MD   chief complaint: Chest tightness with activity.   History of Present Illness:  Rodney Friedman is a 70 y.o. male with CAD and remote CABG, paroxysmal atrial fibrillation, mild aortic stenosis, carotid artery stenosis, hypertension, hyperlipidemia and type 2 diabetes mellitus.  He remains physically active and does his own yard work. After working hard he does feel occasionally a little chest tightness but this resolves promptly. He has anxiety attacks about 4 times a year when the chest discomfort may be a little worse. He has been recent diagnosed with essential tremor and has full blown diabetes now. He is managing it with diet. He remains mildly obese and has actually gained some weight since last year. He has not had any rapid palpitations, syncope, dizziness, diaphoresis, profound weakness (symptoms he associated with his previous episode of paroxysmal atrial fibrillation).  He has not had any bleeding problems on warfarin but complains of the need for frequent lab tests. Discussed switching to a novel anticoagulant today and he wishes to do this. Will use atelectasis  He has a history of coronary disease and underwent 3 vessel bypass surgery in [place] in 2006 (" heartburn" and left antecubital pain ) in July 2014 he had an episode of atrial fibrillation with 2014. He has significant left ventricular hypertrophy and very mild aortic valve stenosis. He has preserved left ventricular systolic function although there is some degree of anterior hypokinesis by previous echo. His nuclear stress test in June 2013 showed normal pattern of perfusion and an ejection fraction of 52%. He has bilateral carotid bruits that are reportedly due to external carotid artery stenoses.  His last carotid ultrasound was performed in 2013.   He has  had chronic lymphocytic leukemia for over 10 years and has not required chemotherapy for this. He has treated prostate cancer and is followed by Dr. Alvy Bimler.  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 Unfortunately he still smokes roughly 10 cigarettes a day , which he believes he needs due to anxiety.   Past Medical History:  Diagnosis Date  . Anxiety   . Cataract   . CLL (chronic lymphocytic leukemia) ([place]) 07/08/2013  . Depression   . Diabetes mellitus without complication ([place])   . Eye abnormality    Macular scarring R eye  . Glaucoma   . Heart murmur   . Hypertension   . Leukemia ([place])    CLL  . Myocardial infarction ([place])   . Prostate cancer ([place])   . Prostate cancer ([place]) 07/08/2013  . Substance abuse   . 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 COLONOSCOPY    . 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 EYE SURGERY      Current Medications: Outpatient Medications Prior to Visit  Medication Sig Dispense Refill  . amLODipine (NORVASC) 10 MG tablet Take 1 tablet (10 mg total) by mouth daily. 90 tablet 0  . B Complex Vitamins (VITAMIN-B COMPLEX PO) Take by mouth.    Marland Kitchen BIOTIN PO Take by mouth.    Marland Kitchen buPROPion (WELLBUTRIN SR) 150 MG 12 hr tablet Take 1 tablet (150 mg total) by mouth 2 (two) times daily. 180 tablet 3  .  Calcium Carbonate-Vit D-Min (CALCIUM 1200 PO) Take 1,200 mg by mouth daily.    Marland Kitchen lamoTRIgine (LAMICTAL) 100 MG tablet Take 1 tablet (100 mg total) by mouth 3 (three) times daily. 90 tablet 3  . latanoprost (XALATAN) 0.005 % ophthalmic solution Once a day every third day    . LYCOPENE PO Take 8 mg by mouth daily.    . metFORMIN (GLUCOPHAGE) 500 MG tablet TAKE 1 TABLET BY MOUTH TWICE A DAY 180 tablet 1  . metoprolol succinate (TOPROL-XL) 100 MG 24 hr tablet TAKE 1 TABLET BY MOUTH EVERY DAY 90 tablet 2  . pravastatin (PRAVACHOL) 80 MG tablet TAKE 1  TABLET BY MOUTH EVERY DAY 90 tablet 1  . valsartan (DIOVAN) 320 MG tablet TAKE 1 TABLET BY MOUTH EVERY DAY 90 tablet 2  . vitamin C (ASCORBIC ACID) 500 MG tablet Take 500 mg by mouth daily.    Marland Kitchen warfarin (COUMADIN) 4 MG tablet Take 1 to 1.5 tablets by mouth daily as directed by coumadin clinic 120 tablet 1  . polyethylene glycol powder (GLYCOLAX/MIRALAX) powder Take 17 g by mouth daily. (Patient not taking: Reported on 12/31/2015) 527 g 12   No facility-administered medications prior to visit.      Allergies:   Other   Social History   Social History  . Marital status: Divorced    Spouse name: N/A  . Number of children: 2  . Years of education: 16   Occupational History  . Retired    Social History Main Topics  . Smoking status: Current Every Day Smoker    Packs/day: 0.75    Years: 50.00    Types: Cigarettes  . Smokeless tobacco: Never Used  . Alcohol use No  . Drug use: No  . Sexual activity: Not Asked   Other Topics Concern  . None   Social History Narrative   Lives at home w/ his father   Divorced   Right-handed   Education: College   No caffeine     Family History:  The patient's family history includes Cancer in his father and mother; Hypertension in his father.   ROS:   Please see the history of present illness.    ROS All other systems reviewed and are negative.   PHYSICAL EXAM:   VS:  BP 126/62   Pulse 84   Ht 5\' 9"  (1.753 m)   Wt 218 lb 9.6 oz (99.2 kg)   BMI 32.28 kg/m    GEN: Well nourished, well developed, in no acute distress  HEENT: normal  Neck: no JVD or masses, mild bilateral carotid bruits Cardiac: RRR; 2/6 early peaking aortic ejection murmur, no diastolic murmurs, rubs, or gallops,no edema  Respiratory:  clear to auscultation bilaterally, normal work of breathing GI: soft, nontender, nondistended, + BS MS: no deformity or atrophy  Skin: warm and dry, no rash Neuro:  Alert and Oriented x 3, Strength and sensation are intact Psych:  euthymic mood, full affect  Wt Readings from Last 3 Encounters:  12/31/15 218 lb 9.6 oz (99.2 kg)  09/22/15 219 lb 8 oz (99.6 kg)  09/08/15 217 lb (98.4 kg)      Studies/Labs Reviewed:   EKG:  EKG is ordered today.  The ekg ordered today demonstrates Normal sinus rhythm, voltage criteria for left ventricular hypertrophy, left anterior fascicular block, QRS 160 ms, normal QTC 441 ms  Recent Labs: 03/22/2015: Hemoglobin 14.5; Platelets 249 09/08/2015: ALT 26; BUN 12; Creat 1.12; Potassium 4.5; Sodium 142; TSH 2.23  Lipid Panel    Component Value Date/Time   CHOL 190 09/08/2015 1433   TRIG 189 (H) 09/08/2015 1433   HDL 32 (L) 09/08/2015 1433   CHOLHDL 5.9 (H) 09/08/2015 1433   VLDL 38 (H) 09/08/2015 1433   LDLCALC 120 09/08/2015 1433    ASSESSMENT:    1. Paroxysmal atrial fibrillation (HCC)   2. Long term current use of anticoagulant therapy   3. Coronary artery disease involving coronary bypass graft with other forms of angina pectoris ([place])   4. Mild aortic valve stenosis   5. Dyslipidemia   6. Pre-diabetes   7. Tobacco abuse   8. Bilateral carotid bruits without stenosis   9. CLL (chronic lymphocytic leukemia) ([place])   10. Medication management      PLAN:  In order of problems listed above:  1. AFib: asymptomatic. CHADSVasc 4 (age, HTN, CAD, DM) 2. Switching to eliquis 5 mg BID per his request. Discussed the short action of this agent and the need for compliance with twice a day dosing. Also discussed interruption for 24 hours for minor surgical procedures, 48 hours for more complex procedures with high bleeding risk. Renal function is normal. 3. CAD: CCS class I-II angina with exertion or exertional equivalents 4. AS: Mild, not get hemodynamically relevant 5. HLP: Lipids not at target on current medication. Recommend switching to Crestor 20 mg daily, with close monitoring of his blood sugar. He wants to discuss this as his next appointment. 6. DM: diet controlled.  Risk of hyperglycemia with a more potent statin, but overall the risk/benefit ratio is in favor of treatment to a target LDL less than 70 7. He does not think he can stop smoking. 8. He has bilateral carotid bruits and mild carotid plaque without obstruction. 9. CLL: , Not requiring treatment. No anemia or thrombocytopenia    Medication Adjustments/Labs and Tests Ordered: Current medicines are reviewed at length with the patient today.  Concerns regarding medicines are outlined above.  Medication changes, Labs and Tests ordered today are listed in the Patient Instructions below. Patient Instructions  Medication Instructions: Dr Sallyanne Kuster has recommended making the following medication changes: 1. STOP Warfarin 2. START Eliquis 5 mg - take 1 tablet (5 mg total) by mouth twice daily  Labwork: Your physician recommends that you return for lab work in 3 months.  Testing/Procedures: NONE ORDERED  Follow-up: Dr Sallyanne Kuster recommends that you schedule a follow-up appointment in 3-4 months.  If you need a refill on your cardiac medications before your next appointment, please call your pharmacy.    Signed, Sanda Klein, MD  01/02/2016 11:59 AM    [facility] [place], [place], [place]  [address] Phone: [phone]; Fax: [phone]

## 2015-12-31 NOTE — Patient Instructions (Addendum)
Medication Instructions: Dr Sallyanne Kuster has recommended making the following medication changes: 1. STOP Warfarin 2. START Eliquis 5 mg - take 1 tablet (5 mg total) by mouth twice daily  Labwork: Your physician recommends that you return for lab work in 3 months.  Testing/Procedures: NONE ORDERED  Follow-up: Dr Sallyanne Kuster recommends that you schedule a follow-up appointment in 3-4 months.  If you need a refill on your cardiac medications before your next appointment, please call your pharmacy.

## 2016-01-01 ENCOUNTER — Encounter: Payer: Self-pay | Admitting: Cardiovascular Disease

## 2016-01-02 DIAGNOSIS — R0989 Other specified symptoms and signs involving the circulatory and respiratory systems: Secondary | ICD-10-CM | POA: Insufficient documentation

## 2016-01-03 ENCOUNTER — Telehealth: Payer: Self-pay | Admitting: Hematology and Oncology

## 2016-01-03 ENCOUNTER — Ambulatory Visit (HOSPITAL_BASED_OUTPATIENT_CLINIC_OR_DEPARTMENT_OTHER): Payer: Medicare Other | Admitting: Hematology and Oncology

## 2016-01-03 ENCOUNTER — Other Ambulatory Visit (HOSPITAL_BASED_OUTPATIENT_CLINIC_OR_DEPARTMENT_OTHER): Payer: Medicare Other

## 2016-01-03 ENCOUNTER — Encounter: Payer: Self-pay | Admitting: Hematology and Oncology

## 2016-01-03 VITALS — BP 129/61 | HR 79 | Temp 97.9°F | Resp 20 | Ht 69.0 in | Wt 220.5 lb

## 2016-01-03 DIAGNOSIS — Z720 Tobacco use: Secondary | ICD-10-CM | POA: Diagnosis not present

## 2016-01-03 DIAGNOSIS — I482 Chronic atrial fibrillation, unspecified: Secondary | ICD-10-CM

## 2016-01-03 DIAGNOSIS — C61 Malignant neoplasm of prostate: Secondary | ICD-10-CM | POA: Diagnosis not present

## 2016-01-03 DIAGNOSIS — Z515 Encounter for palliative care: Secondary | ICD-10-CM

## 2016-01-03 DIAGNOSIS — C9110 Chronic lymphocytic leukemia of B-cell type not having achieved remission: Secondary | ICD-10-CM | POA: Diagnosis not present

## 2016-01-03 LAB — CBC WITH DIFFERENTIAL/PLATELET
BASO%: 0.4 % (ref 0.0–2.0)
BASOS ABS: 0.1 10*3/uL (ref 0.0–0.1)
EOS%: 1.5 % (ref 0.0–7.0)
Eosinophils Absolute: 0.2 10*3/uL (ref 0.0–0.5)
HEMATOCRIT: 43.8 % (ref 38.4–49.9)
HEMOGLOBIN: 15.4 g/dL (ref 13.0–17.1)
LYMPH#: 8.3 10*3/uL — AB (ref 0.9–3.3)
LYMPH%: 54.9 % — ABNORMAL HIGH (ref 14.0–49.0)
MCH: 31.5 pg (ref 27.2–33.4)
MCHC: 35.2 g/dL (ref 32.0–36.0)
MCV: 89.6 fL (ref 79.3–98.0)
MONO#: 1 10*3/uL — AB (ref 0.1–0.9)
MONO%: 6.4 % (ref 0.0–14.0)
NEUT#: 5.5 10*3/uL (ref 1.5–6.5)
NEUT%: 36.8 % — AB (ref 39.0–75.0)
PLATELETS: 199 10*3/uL (ref 140–400)
RBC: 4.89 10*6/uL (ref 4.20–5.82)
RDW: 14.2 % (ref 11.0–14.6)
WBC: 15 10*3/uL — ABNORMAL HIGH (ref 4.0–10.3)

## 2016-01-03 LAB — TECHNOLOGIST REVIEW

## 2016-01-03 NOTE — Progress Notes (Signed)
Rodney Friedman OFFICE PROGRESS NOTE  Patient Care Team: Rodney Agreste, MD as PCP - General (Family Medicine) Rodney Rhodes, MD as Consulting Physician (Urology) Rodney Klein, MD as Consulting Physician (Cardiology) Rodney Iba, MD as Consulting Physician (Ophthalmology)  SUMMARY OF ONCOLOGIC HISTORY: Oncology History   Prostate cancer   Primary site: Prostate (Left)   Staging method: AJCC 7th Edition   Clinical: Stage IIB (T1c, N0, M0) signed by Rodney Lark, MD on 07/09/2013 10:04 AM   Summary: Stage IIB (T1c, N0, M0)       Prostate cancer ([place])   07/12/2012 Tumor Marker    PSA was high at 9.6     09/30/2012 Procedure    He underwent prostate biopsy at confirm diagnosis of prostate cancer.     11/11/2012 - 01/09/2013 Radiation Therapy    The patient completed radiation therapy to his prostate using IM RT technique      INTERVAL HISTORY: Please see below for problem oriented charting. He returns for follow-up. He denies urinary difficulties. He is concerned about his daughter's family He is attempting to quit smoking, down to less than 12 cigarettes per day for still unable to quit. He wants to lose weight but lacked motivation and means to exercise. He is concerned about financial impact on new medications related to anticoagulation therapy. He is currently taking warfarin.The patient denies any recent signs or symptoms of bleeding such as spontaneous epistaxis, hematuria or hematochezia. He denies new lymphadenopathy. Denies recent infection.  REVIEW OF SYSTEMS:   Constitutional: Denies fevers, chills or abnormal weight loss Eyes: Denies blurriness of vision Ears, nose, mouth, throat, and face: Denies mucositis or sore throat Respiratory: Denies cough, dyspnea or wheezes Cardiovascular: Denies palpitation, chest discomfort or lower extremity swelling Gastrointestinal:  Denies nausea, heartburn or change in bowel habits Skin: Denies abnormal skin  rashes Lymphatics: Denies new lymphadenopathy or easy bruising Neurological:Denies numbness, tingling or new weaknesses Behavioral/Psych: Mood is stable, no new changes  All other systems were reviewed with the patient and are negative.  I have reviewed the past medical history, past surgical history, social history and family history with the patient and they are unchanged from previous note.  ALLERGIES:  is allergic to other.  MEDICATIONS:  Current Outpatient Prescriptions  Medication Sig Dispense Refill  . amLODipine (NORVASC) 10 MG tablet Take 1 tablet (10 mg total) by mouth daily. 90 tablet 0  . apixaban (ELIQUIS) 5 MG TABS tablet Take 1 tablet (5 mg total) by mouth 2 (two) times daily. 180 tablet 3  . B Complex Vitamins (VITAMIN-B COMPLEX PO) Take by mouth.    Marland Kitchen BIOTIN PO Take by mouth.    Marland Kitchen buPROPion (WELLBUTRIN SR) 150 MG 12 hr tablet Take 1 tablet (150 mg total) by mouth 2 (two) times daily. 180 tablet 3  . Calcium Carbonate-Vit D-Min (CALCIUM 1200 PO) Take 1,200 mg by mouth daily.    Marland Kitchen lamoTRIgine (LAMICTAL) 100 MG tablet Take 1 tablet (100 mg total) by mouth 3 (three) times daily. 90 tablet 3  . latanoprost (XALATAN) 0.005 % ophthalmic solution Once a day every third day    . LYCOPENE PO Take 8 mg by mouth daily.    . metFORMIN (GLUCOPHAGE) 500 MG tablet TAKE 1 TABLET BY MOUTH TWICE A DAY 180 tablet 1  . metoprolol succinate (TOPROL-XL) 100 MG 24 hr tablet TAKE 1 TABLET BY MOUTH EVERY DAY 90 tablet 2  . Omega-3 Fatty Acids (FISH OIL) 1000 MG CAPS Take 1,000  mg by mouth daily.    . pravastatin (PRAVACHOL) 80 MG tablet TAKE 1 TABLET BY MOUTH EVERY DAY 90 tablet 1  . valsartan (DIOVAN) 320 MG tablet TAKE 1 TABLET BY MOUTH EVERY DAY 90 tablet 2  . vitamin B-12 (CYANOCOBALAMIN) 500 MCG tablet Take 500 mcg by mouth daily.    . vitamin C (ASCORBIC ACID) 500 MG tablet Take 500 mg by mouth daily.    . polyethylene glycol (MIRALAX / GLYCOLAX) packet Take 17 g by mouth daily as needed  for mild constipation.     No current facility-administered medications for this visit.     PHYSICAL EXAMINATION: ECOG PERFORMANCE STATUS: 1 - Symptomatic but completely ambulatory  Vitals:   01/03/16 0828  BP: 129/61  Pulse: 79  Resp: 20  Temp: 97.9 F (36.6 C)   Filed Weights   01/03/16 0828  Weight: 220 lb 8 oz (100 kg)    GENERAL:alert, no distress and comfortable. He is mildly obese SKIN: skin color, texture, turgor are normal, no rashes or significant lesions EYES: normal, Conjunctiva are pink and non-injected, sclera clear OROPHARYNX:no exudate, no erythema and lips, buccal mucosa, and tongue normal  NECK: supple, thyroid normal size, non-tender, without nodularity LYMPH:  He has palpable lymphadenopathy in the neck region, unchanged  LUNGS: clear to auscultation and percussion with normal breathing effort HEART: regular rate & rhythm and no murmurs and no lower extremity edema ABDOMEN:abdomen soft, non-tender and normal bowel sounds Musculoskeletal:no cyanosis of digits and no clubbing  NEURO: alert & oriented x 3 with fluent speech, no focal motor/sensory deficits  LABORATORY DATA:  I have reviewed the data as listed    Component Value Date/Time   NA 142 09/08/2015 1433   NA 141 01/04/2015 0808   K 4.5 09/08/2015 1433   K 4.3 01/04/2015 0808   CL 106 09/08/2015 1433   CO2 24 09/08/2015 1433   CO2 21 (L) 01/04/2015 0808   GLUCOSE 80 09/08/2015 1433   GLUCOSE 115 01/04/2015 0808   BUN 12 09/08/2015 1433   BUN 18.7 01/04/2015 0808   CREATININE 1.12 09/08/2015 1433   CREATININE 0.9 01/04/2015 0808   CALCIUM 9.4 09/08/2015 1433   CALCIUM 9.0 01/04/2015 0808   PROT 6.9 09/08/2015 1433   PROT 6.3 (L) 01/04/2015 0808   ALBUMIN 4.3 09/08/2015 1433   ALBUMIN 3.7 01/04/2015 0808   AST 18 09/08/2015 1433   AST 19 01/04/2015 0808   ALT 26 09/08/2015 1433   ALT 33 01/04/2015 0808   ALKPHOS 46 09/08/2015 1433   ALKPHOS 47 01/04/2015 0808   BILITOT 0.5  09/08/2015 1433   BILITOT 0.23 01/04/2015 0808   GFRNONAA 68 09/08/2015 1433   GFRAA 78 09/08/2015 1433    No results found for: SPEP, UPEP  Lab Results  Component Value Date   WBC 15.0 (H) 01/03/2016   NEUTROABS 5.5 01/03/2016   HGB 15.4 01/03/2016   HCT 43.8 01/03/2016   MCV 89.6 01/03/2016   PLT 199 01/03/2016      Chemistry      Component Value Date/Time   NA 142 09/08/2015 1433   NA 141 01/04/2015 0808   K 4.5 09/08/2015 1433   K 4.3 01/04/2015 0808   CL 106 09/08/2015 1433   CO2 24 09/08/2015 1433   CO2 21 (L) 01/04/2015 0808   BUN 12 09/08/2015 1433   BUN 18.7 01/04/2015 0808   CREATININE 1.12 09/08/2015 1433   CREATININE 0.9 01/04/2015 0808      Component  Value Date/Time   CALCIUM 9.4 09/08/2015 1433   CALCIUM 9.0 01/04/2015 0808   ALKPHOS 46 09/08/2015 1433   ALKPHOS 47 01/04/2015 0808   AST 18 09/08/2015 1433   AST 19 01/04/2015 0808   ALT 26 09/08/2015 1433   ALT 33 01/04/2015 0808   BILITOT 0.5 09/08/2015 1433   BILITOT 0.23 01/04/2015 0808       ASSESSMENT & PLAN:  CLL (chronic lymphocytic leukemia) Clinically, his white blood cell count is stable. He has small palpable lymphadenopathy in the neck and axilla. I recommend observation only and see him back on a yearly basis with history, physical examination and blood work.   Prostate cancer I would defer treatment to his urologist. The patient had stopped taking Lupron and according to him, the last PSA was undetectable.   Chronic atrial fibrillation (HCC) The patient is doing well on warfarin. He recently, his cardiologist recommended changing his treatment to Eliquis The patient stated that he cannot afford the cost of the drug I gave him some information available online to see if he can get assistance to pay for the medication  Tobacco abuse I spent some time counseling the patient the importance of tobacco cessation. he is currently attempting to quit on his own  Quality of life  palliative care encounter We discussed increase physical activity, weight loss, healthy living and possible enrollment with the [place] program with the [facility]. I gave him additional resources from the [facility]. We discussed importance of vitamin D supplementation.    Orders Placed This Encounter  Procedures  . CBC with Differential/Platelet    Standing Status:   Future    Standing Expiration Date:   02/06/2017   All questions were answered. The patient knows to call the clinic with any problems, questions or concerns. No barriers to learning was detected. I spent 25 minutes counseling the patient face to face. The total time spent in the appointment was 30 minutes and more than 50% was on counseling and review of test results     [facility] [place], Milad Bublitz, MD 01/03/2016 10:28 AM

## 2016-01-03 NOTE — Assessment & Plan Note (Signed)
I would defer treatment to his urologist. The patient had stopped taking Lupron and according to him, the last PSA was undetectable.  

## 2016-01-03 NOTE — Telephone Encounter (Signed)
Gave patient avs report and appointments for August 2018 °

## 2016-01-03 NOTE — Assessment & Plan Note (Signed)
Clinically, his white blood cell count is stable. He has small palpable lymphadenopathy in the neck and axilla. I recommend observation only and see him back on a yearly basis with history, physical examination and blood work.  

## 2016-01-03 NOTE — Assessment & Plan Note (Signed)
I spent some time counseling the patient the importance of tobacco cessation. he is currently attempting to quit on his own 

## 2016-01-03 NOTE — Assessment & Plan Note (Signed)
The patient is doing well on warfarin. He recently, his cardiologist recommended changing his treatment to Eliquis The patient stated that he cannot afford the cost of the drug I gave him some information available online to see if he can get assistance to pay for the medication

## 2016-01-03 NOTE — Assessment & Plan Note (Signed)
We discussed increase physical activity, weight loss, healthy living and possible enrollment with the [place] program with the [facility]. I gave him additional resources from the [place]. We discussed importance of vitamin D supplementation.

## 2016-01-13 ENCOUNTER — Encounter (HOSPITAL_COMMUNITY): Payer: Self-pay | Admitting: Psychiatry

## 2016-01-13 ENCOUNTER — Ambulatory Visit (INDEPENDENT_AMBULATORY_CARE_PROVIDER_SITE_OTHER): Payer: Medicare Other | Admitting: Psychiatry

## 2016-01-13 VITALS — BP 128/72 | HR 65 | Ht 69.0 in | Wt 218.0 lb

## 2016-01-13 DIAGNOSIS — F3130 Bipolar disorder, current episode depressed, mild or moderate severity, unspecified: Secondary | ICD-10-CM

## 2016-01-13 DIAGNOSIS — F3162 Bipolar disorder, current episode mixed, moderate: Secondary | ICD-10-CM

## 2016-01-13 MED ORDER — BUPROPION HCL ER (SR) 150 MG PO TB12: ORAL_TABLET | ORAL | 2 refills | Status: DC

## 2016-01-13 MED ORDER — LAMOTRIGINE 100 MG PO TABS: 100.0000 mg | ORAL_TABLET | Freq: Three times a day (TID) | ORAL | 5 refills | Status: DC

## 2016-01-13 MED ORDER — LAMOTRIGINE 100 MG PO TABS: 100.0000 mg | ORAL_TABLET | Freq: Three times a day (TID) | ORAL | 2 refills | Status: DC

## 2016-01-13 NOTE — Progress Notes (Signed)
Psychiatric Initial Adult Assessment   Patient Identification: Rodney Friedman MRN:  [id number] Date of Evaluation:  01/13/2016 Referral Source: Dr. Nyoka Cowden Chief Complaint: Need follow-up care  Visit Diagnosis: Bipolar disorder, substance use disorder   ICD-9-CM ICD-10-CM   1. Bipolar 1 disorder, mixed, moderate (HCC) 296.62 F31.62 buPROPion (WELLBUTRIN SR) 150 MG 12 hr tablet    History of Present Illness:This patient is a 70 year old white male who is divorced and in from [place] to [place] 2 and half years ago. In getting his psychiatric medications from his primary care doctor who now wishes him to get it from a psychiatrist. The patient actually feels fairly stable. The patient was divorced in 1984 and has 2 children. Unfortunately these joint all children are substance abusers. His daughter is addiction to crack cocaine and he is very little contact with her. She had to children that he is very involved with that descends $2000 every month to their [facility]. The patient is very dedicated to them. The patient has another child name Hudek who has one child who also is a substance abuser. 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 The patient also has his father is 75 years of age who lives with him. Financially the patient is doing only fairly well. At this time the patient denies daily depression. He is sleeping and eating well. He has good energy can think and concentrate without problems he is not anhedonic. He loves to read he actually draws cartoons.. He loves doing woodwork. He is very active. The patient is not suicidal. He has never made a suicide attempt. The patient denies the use of alcohol at this time. However he drank for 35 year. Had a DUI in 1985. His last drink was 07/21/2000. The patient is actively involved in [place] as well as NA. He has a sponsor. Patient also for 30 year. Used a lot of marijuana and rare cocaine. She's not used any of the substance  of alcohol or any illicit drugs well or 2 decades. He follows the 12-step program closely. The patient denies symptoms of generalized anxiety disorder panic disorder or obsessive-compulsive disorder. In a very close evaluation the patient describes that he's had some irritability and manic-like symptoms but was only when he was using. In essence when he was not using drugs or alcohol he's never had symptoms consistent with a manic episode nor of a depressive episode. I think a lot of his symptoms must been related to his substance use. Nonetheless she saw psychiatrist for years who prescribed Lamictal 300 mg and Wellbutrin 150 mg. The patient's medical illnesses are extensive. Includes diabetes mellitus, macular degenerative disc disease chronic lymphocytic leukemia high cholesterol coronary artery disease with bypass surgery. The patient is never been in a psychiatric hospital before but he has been in a 30 day program back in 1984. The patient is been on in therapy on and off but didn't tell he was very productive. It should be noted this patient came 20 minutes late for this visit. At this time overall he is very stable.  Associated Signs/Symptoms: Depression Symptoms:   (Hypo) Manic Symptoms:   Anxiety Symptoms:   Psychotic Symptoms:   PTSD Symptoms:   Past Psychiatric History: Other psychiatrist care for over 20 years taking mainly Lamictal and Wellbutrin  Previous Psychotropic Medications: Lamictal 300 mg Wellbutrin 150 Substance Abuse History in the last 12 months:  Yes.    Consequences of Substance Abuse: NA  Past Medical History:  Past Medical History:  Diagnosis Date  . Anxiety   . Cataract   . CLL (chronic lymphocytic leukemia) ([place]) 07/08/2013  . Depression   . Diabetes mellitus without complication ([place])   . Eye abnormality    Macular scarring R eye  . Glaucoma   . Heart murmur   . Hypertension   . Leukemia ([place])    CLL  . Myocardial infarction ([place])   . Prostate cancer  ([place])   . Prostate cancer ([place]) 07/08/2013  . Substance abuse   . 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 COLONOSCOPY    . 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 EYE SURGERY      Family Psychiatric History:   Family History:  Family History  Problem Relation Age of Onset  . Cancer Mother     colon cancer  . Cancer Father     prostate ca  . Hypertension Father     Social History:   Social History   Social History  . Marital status: Divorced    Spouse name: N/A  . Number of children: 2  . Years of education: 16   Occupational History  . Retired    Social History Main Topics  . Smoking status: Current Every Day Smoker    Packs/day: 0.75    Years: 50.00    Types: Cigarettes  . Smokeless tobacco: Never Used  . Alcohol use No  . Drug use: No  . Sexual activity: Not Asked   Other Topics Concern  . None   Social History Narrative   Lives at home w/ his father   Divorced   Right-handed   Education: College   No caffeine    Additional Social History:   Allergies:   Allergies  Allergen Reactions  . Other Rash    Allergen: "Plants and bushes while doing yard work"    Metabolic Disorder Labs: Lab Results  Component Value Date   HGBA1C 6.1 (H) 03/22/2015   MPG 128 (H) 03/22/2015   No results found for: PROLACTIN Lab Results  Component Value Date   CHOL 190 09/08/2015   TRIG 189 (H) 09/08/2015   HDL 32 (L) 09/08/2015   CHOLHDL 5.9 (H) 09/08/2015   VLDL 38 (H) 09/08/2015   LDLCALC 120 09/08/2015   LDLCALC 114 03/22/2015     Current Medications: Current Outpatient Prescriptions  Medication Sig Dispense Refill  . amLODipine (NORVASC) 10 MG tablet Take 1 tablet (10 mg total) by mouth daily. 90 tablet 0  . apixaban (ELIQUIS) 5 MG TABS tablet Take 1 tablet (5 mg total) by mouth 2 (two) times daily. 180 tablet 3  . B Complex Vitamins (VITAMIN-B COMPLEX PO) Take by mouth.    Marland Kitchen BIOTIN PO Take by mouth.     Marland Kitchen buPROPion (WELLBUTRIN SR) 150 MG 12 hr tablet 1  qam 90 tablet 2  . Calcium Carbonate-Vit D-Min (CALCIUM 1200 PO) Take 1,200 mg by mouth daily.    Marland Kitchen lamoTRIgine (LAMICTAL) 100 MG tablet Take 1 tablet (100 mg total) by mouth 3 (three) times daily. 270 tablet 2  . latanoprost (XALATAN) 0.005 % ophthalmic solution Once a day every third day    . LYCOPENE PO Take 8 mg by mouth daily.    . metFORMIN (GLUCOPHAGE) 500 MG tablet TAKE 1 TABLET BY MOUTH TWICE A DAY 180 tablet 1  . metoprolol succinate (TOPROL-XL) 100 MG 24 hr tablet TAKE 1 TABLET BY MOUTH EVERY DAY  90 tablet 2  . Omega-3 Fatty Acids (FISH OIL) 1000 MG CAPS Take 1,000 mg by mouth daily.    . polyethylene glycol (MIRALAX / GLYCOLAX) packet Take 17 g by mouth daily as needed for mild constipation.    . pravastatin (PRAVACHOL) 80 MG tablet TAKE 1 TABLET BY MOUTH EVERY DAY 90 tablet 1  . valsartan (DIOVAN) 320 MG tablet TAKE 1 TABLET BY MOUTH EVERY DAY 90 tablet 2  . vitamin B-12 (CYANOCOBALAMIN) 500 MCG tablet Take 500 mcg by mouth daily.    . vitamin C (ASCORBIC ACID) 500 MG tablet Take 500 mg by mouth daily.     No current facility-administered medications for this visit.     Neurologic: Headache: No Seizure: No Paresthesias:No  Musculoskeletal: Strength & Muscle Tone: within normal limits Gait & Station: normal Patient leans: Right  Psychiatric Specialty Exam: ROS  Blood pressure 128/72, pulse 65, height 5\' 9"  (1.753 m), weight 218 lb (98.9 kg).Body mass index is 32.19 kg/m.  General Appearance: Casual  Eye Contact:  Good  Speech:  Clear and Coherent  Volume:  Normal  Mood:  NA  Affect:  Appropriate  Thought Process:  Goal Directed  Orientation:  NA  Thought Content:  WDL  Suicidal Thoughts:  No  Homicidal Thoughts:  No  Memory:  Negative  Judgement:  Good  Insight:  Good  Psychomotor Activity:  Normal  Concentration:    Recall:  [place] of Knowledge:Good  Language: Good  Akathisia:  No  Handed:  Right   AIMS (if indicated):    Assets:  Desire for Improvement  ADL's:  Intact  Cognition: WNL  Sleep:      Treatment Plan Summary: At this time the patient will continue taking Lamictal 100 mg taking 3 of them at night. He'll also continue taking Wellbutrin 150 mg every morning. And discharged and interview the patient was very engageable and very appropriate. He will return to complete this evaluation in 2 months. I think the patient is actually doing well. He is not suicidal not homicidal he is functioning very well.   Haskel Schroeder, MD 8/24/20173:58 PM

## 2016-01-14 ENCOUNTER — Ambulatory Visit (INDEPENDENT_AMBULATORY_CARE_PROVIDER_SITE_OTHER): Payer: Medicare Other | Admitting: Pharmacist Clinician (PhC)/ Clinical Pharmacy Specialist

## 2016-01-14 DIAGNOSIS — I482 Chronic atrial fibrillation, unspecified: Secondary | ICD-10-CM

## 2016-01-14 DIAGNOSIS — Z7901 Long term (current) use of anticoagulants: Secondary | ICD-10-CM

## 2016-01-14 DIAGNOSIS — I480 Paroxysmal atrial fibrillation: Secondary | ICD-10-CM

## 2016-01-14 LAB — POCT INR: INR: 3.2

## 2016-01-25 ENCOUNTER — Other Ambulatory Visit: Payer: Self-pay | Admitting: Cardiovascular Disease

## 2016-01-25 DIAGNOSIS — I6529 Occlusion and stenosis of unspecified carotid artery: Secondary | ICD-10-CM

## 2016-01-31 ENCOUNTER — Ambulatory Visit (HOSPITAL_COMMUNITY)
Admission: RE | Admit: 2016-01-31 | Discharge: 2016-01-31 | Disposition: A | Discharge status: Home / Self Care | Payer: Medicare Other | Source: Ambulatory Visit | Attending: Cardiology | Admitting: Cardiology

## 2016-01-31 DIAGNOSIS — E785 Hyperlipidemia, unspecified: Secondary | ICD-10-CM | POA: Diagnosis not present

## 2016-01-31 DIAGNOSIS — Z720 Tobacco use: Secondary | ICD-10-CM | POA: Insufficient documentation

## 2016-01-31 DIAGNOSIS — I6529 Occlusion and stenosis of unspecified carotid artery: Secondary | ICD-10-CM | POA: Diagnosis not present

## 2016-01-31 DIAGNOSIS — I6523 Occlusion and stenosis of bilateral carotid arteries: Secondary | ICD-10-CM | POA: Insufficient documentation

## 2016-01-31 DIAGNOSIS — I10 Essential (primary) hypertension: Secondary | ICD-10-CM | POA: Diagnosis not present

## 2016-01-31 DIAGNOSIS — E119 Type 2 diabetes mellitus without complications: Secondary | ICD-10-CM | POA: Insufficient documentation

## 2016-01-31 DIAGNOSIS — Z951 Presence of aortocoronary bypass graft: Secondary | ICD-10-CM | POA: Insufficient documentation

## 2016-01-31 DIAGNOSIS — I2510 Atherosclerotic heart disease of native coronary artery without angina pectoris: Secondary | ICD-10-CM | POA: Insufficient documentation

## 2016-02-01 DIAGNOSIS — L570 Actinic keratosis: Secondary | ICD-10-CM | POA: Diagnosis not present

## 2016-02-01 DIAGNOSIS — L720 Epidermal cyst: Secondary | ICD-10-CM | POA: Diagnosis not present

## 2016-02-01 DIAGNOSIS — I872 Venous insufficiency (chronic) (peripheral): Secondary | ICD-10-CM | POA: Diagnosis not present

## 2016-02-01 DIAGNOSIS — L219 Seborrheic dermatitis, unspecified: Secondary | ICD-10-CM | POA: Diagnosis not present

## 2016-02-01 DIAGNOSIS — L918 Other hypertrophic disorders of the skin: Secondary | ICD-10-CM | POA: Diagnosis not present

## 2016-02-03 ENCOUNTER — Encounter: Payer: Self-pay | Admitting: Cardiovascular Disease

## 2016-02-04 ENCOUNTER — Encounter: Payer: Self-pay | Admitting: Cardiovascular Disease

## 2016-02-04 ENCOUNTER — Encounter: Payer: Self-pay | Admitting: Family Medicine

## 2016-02-04 ENCOUNTER — Encounter: Payer: Self-pay | Admitting: Hematology and Oncology

## 2016-02-09 ENCOUNTER — Telehealth: Payer: Self-pay

## 2016-02-09 MED ORDER — AMLODIPINE BESYLATE 10 MG PO TABS: 10.0000 mg | ORAL_TABLET | Freq: Every day | ORAL | 0 refills | Status: DC

## 2016-02-09 NOTE — Telephone Encounter (Signed)
Sent in 1 mos RF. Pt is due for f/up in Oct.

## 2016-02-09 NOTE — Telephone Encounter (Signed)
[facility] is calling because they faxed a prescription refill request on 9/15 and 9/19 for amlodipine. Please advise! [facility] [address]

## 2016-02-11 ENCOUNTER — Ambulatory Visit (INDEPENDENT_AMBULATORY_CARE_PROVIDER_SITE_OTHER): Payer: Medicare Other | Admitting: Pharmacist

## 2016-02-11 ENCOUNTER — Ambulatory Visit (INDEPENDENT_AMBULATORY_CARE_PROVIDER_SITE_OTHER): Payer: Medicare Other | Admitting: Family Medicine

## 2016-02-11 VITALS — BP 130/64 | HR 63 | Temp 98.4°F | Resp 16 | Ht 69.0 in | Wt 217.4 lb

## 2016-02-11 DIAGNOSIS — I482 Chronic atrial fibrillation, unspecified: Secondary | ICD-10-CM

## 2016-02-11 DIAGNOSIS — E785 Hyperlipidemia, unspecified: Secondary | ICD-10-CM

## 2016-02-11 DIAGNOSIS — Z7901 Long term (current) use of anticoagulants: Secondary | ICD-10-CM

## 2016-02-11 DIAGNOSIS — Z23 Encounter for immunization: Secondary | ICD-10-CM

## 2016-02-11 DIAGNOSIS — I6529 Occlusion and stenosis of unspecified carotid artery: Secondary | ICD-10-CM | POA: Diagnosis not present

## 2016-02-11 DIAGNOSIS — I10 Essential (primary) hypertension: Secondary | ICD-10-CM | POA: Diagnosis not present

## 2016-02-11 DIAGNOSIS — I480 Paroxysmal atrial fibrillation: Secondary | ICD-10-CM

## 2016-02-11 DIAGNOSIS — E119 Type 2 diabetes mellitus without complications: Secondary | ICD-10-CM | POA: Diagnosis not present

## 2016-02-11 LAB — COMPLETE METABOLIC PANEL WITH GFR
ALBUMIN: 4.2 g/dL (ref 3.6–5.1)
ALK PHOS: 36 U/L — AB (ref 40–115)
ALT: 29 U/L (ref 9–46)
AST: 20 U/L (ref 10–35)
BILIRUBIN TOTAL: 0.5 mg/dL (ref 0.2–1.2)
BUN: 15 mg/dL (ref 7–25)
CO2: 26 mmol/L (ref 20–31)
Calcium: 9.6 mg/dL (ref 8.6–10.3)
Chloride: 105 mmol/L (ref 98–110)
Creat: 1.08 mg/dL (ref 0.70–1.25)
GFR, Est African American: 81 mL/min (ref >=60)
GFR, Est Non African American: 70 mL/min (ref >=60)
GLUCOSE: 84 mg/dL (ref 65–99)
Potassium: 4.5 mmol/L (ref 3.5–5.3)
SODIUM: 140 mmol/L (ref 135–146)
TOTAL PROTEIN: 6.7 g/dL (ref 6.1–8.1)

## 2016-02-11 LAB — LIPID PANEL
Cholesterol: 182 mg/dL (ref 125–200)
HDL: 29 mg/dL — ABNORMAL LOW (ref >=40)
LDL CALC: 104 mg/dL (ref <130)
Total CHOL/HDL Ratio: 6.3 Ratio — ABNORMAL HIGH (ref <=5.0)
Triglycerides: 243 mg/dL — ABNORMAL HIGH (ref <150)
VLDL: 49 mg/dL — ABNORMAL HIGH (ref <30)

## 2016-02-11 LAB — POCT INR: INR: 1.8

## 2016-02-11 MED ORDER — METFORMIN HCL 500 MG PO TABS: 500.0000 mg | ORAL_TABLET | Freq: Two times a day (BID) | ORAL | 1 refills | Status: DC

## 2016-02-11 MED ORDER — AMLODIPINE BESYLATE 10 MG PO TABS: 10.0000 mg | ORAL_TABLET | Freq: Every day | ORAL | 1 refills | Status: DC

## 2016-02-11 MED ORDER — PRAVASTATIN SODIUM 80 MG PO TABS: 80.0000 mg | ORAL_TABLET | Freq: Every day | ORAL | 1 refills | Status: DC

## 2016-02-11 MED ORDER — METOPROLOL SUCCINATE ER 100 MG PO TB24: 100.0000 mg | ORAL_TABLET | Freq: Every day | ORAL | 1 refills | Status: DC

## 2016-02-11 NOTE — Progress Notes (Signed)
By signing my name below, I, Mesha Guinyard, attest that this documentation has been prepared under the direction and in the presence of [name], MD.  Electronically Signed: Verlee Monte, Medical Scribe. 02/11/16. 3:04 PM.  Subjective:    Patient ID: Rodney Friedman, male    DOB: July 24, 1945, 70 y.o.   MRN: [id number]  HPI Chief Complaint  Patient presents with  . Follow-up    check A1c and per patient want stool sample cards to take home    HPI Comments: Rodney Friedman is a 70 y.o. male who presents to the [facility] for DM follow-up. Pt saw an ophthalmologist 5-6 weeks ago and there wasn't any diabetic changes found. Pt is also followed by a dentist. Pt has been taking Metformin 500 mg BID for, in the morning and at night, 3 years and has been tolerating this well. Pt ate a slice of bread 4 hours ago. Pt mentions he rarely eats breakfast, and often skips lunch. Pt is followed by cardiology for Hx of A. 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  Lab Results  Component Value Date   HGBA1C 6.1 (H) 03/22/2015   Lab Results  Component Value Date   MICROALBUR 1.9 10/08/2014   Wt Readings from Last 3 Encounters:  02/11/16 217 lb 6.4 oz (98.6 kg)  01/03/16 220 lb 8 oz (100 kg)  12/31/15 218 lb 9.6 oz (99.2 kg)   HLD: Takes Pravistatin Lab Results  Component Value Date   CHOL 190 09/08/2015   HDL 32 (L) 09/08/2015   LDLCALC 120 09/08/2015   TRIG 189 (H) 09/08/2015   CHOLHDL 5.9 (H) 09/08/2015   Lab Results  Component Value Date   ALT 26 09/08/2015   AST 18 09/08/2015   ALKPHOS 46 09/08/2015   BILITOT 0.5 09/08/2015   HTN: Takes Metoprolol, Amlodipine, and Valsartan. Lab Results  Component Value Date   CREATININE 1.12 09/08/2015   Immunizations: Pt had both of his PNA vaccine 2 years ago Immunization History  Administered Date(s) Administered  . Influenza Split 02/17/2015  . Influenza,inj,Quad PF,36+ Mos 02/11/2016  .  Influenza-Unspecified 02/17/2015  . Pneumococcal Conjugate-13 02/24/2014  . Pneumococcal Polysaccharide-23 09/08/2015  . Tdap 09/08/2015  . Zoster 05/22/2013   Bipolar: Pt's psychiatrist is Dr. Casimiro Needle and last saw him 01/13/16. Pt was told no changes in his medications.  Colon CA Screening: Pt's last colonoscopy was done fall 2014, with 5 polyps found, plan to repeat in 5 years. Mom died of colon CA 5 years ago.   Patient Active Problem List   Diagnosis Date Noted  . Quality of life palliative care encounter 01/03/2016  . Bilateral carotid bruits without stenosis 01/02/2016  . Tremor, essential 09/22/2015  . Glaucoma, open angle 06/16/2015  . 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  . Pseudoaphakia 06/15/2015  . CD (contact dermatitis) 04/29/2014  . 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 Hematuria 01/05/2014  . Skin lesion 01/05/2014  . Mild aortic valve stenosis 01/03/2014  . Hypertensive heart disease 01/03/2014  . Hyperlipidemia 01/03/2014  . Mild obesity 01/03/2014  . Tobacco abuse 01/03/2014  . Chronic atrial fibrillation ([place]) 12/31/2013  . Long term current use of anticoagulant therapy 12/31/2013  . Cataract, nuclear 10/06/2013  . Dermatochalasis of eyelid 10/06/2013  . 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 CAD (coronary artery disease) 09/03/2013  . Pre-diabetes 09/03/2013  . CLL (chronic lymphocytic leukemia) ([place]) 07/08/2013  . Prostate cancer ([place]) 07/08/2013   Past Medical History:  Diagnosis Date  . Anxiety   . Cataract   . 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 Depression   . Diabetes mellitus without complication ([place])   . Eye abnormality    Macular scarring R eye  . Glaucoma   . Heart murmur   . Hypertension   . Leukemia ([name])    CLL  . Myocardial infarction ([place])   . Prostate cancer ([place])   . Prostate cancer ([place]) 07/08/2013  . Substance abuse   . Tremor, essential 09/22/2015   Past Surgical History:  Procedure  Laterality Date  . COLONOSCOPY    . 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 EYE SURGERY     Allergies  Allergen Reactions  . Other Rash    Allergen: "Plants and bushes while doing yard work"   Prior to Admission medications   Medication Sig Start Date End Date Taking? Authorizing Provider  B Complex Vitamins (VITAMIN-B COMPLEX PO) Take by mouth.   Yes Historical Provider, MD  BIOTIN PO Take by mouth.   Yes Historical Provider, MD  buPROPion [facility] SR) 150 MG 12 hr tablet 1  qam 01/13/16  Yes Norma Fredrickson, MD  Calcium Carbonate-Vit D-Min (CALCIUM 1200 PO) Take 1,200 mg by mouth daily.   Yes Historical Provider, MD  lamoTRIgine (LAMICTAL) 100 MG tablet Take 1 tablet (100 mg total) by mouth 3 (three) times daily. 01/13/16  Yes Norma Fredrickson, MD  latanoprost (XALATAN) 0.005 % ophthalmic solution Once a day every third day 06/30/13  Yes Historical Provider, MD  LYCOPENE PO Take 8 mg by mouth daily.   Yes Historical Provider, MD  metFORMIN (GLUCOPHAGE) 500 MG tablet TAKE 1 TABLET BY MOUTH TWICE A DAY 10/19/15  Yes Wendie Agreste, MD  metoprolol succinate (TOPROL-XL) 100 MG 24 hr tablet TAKE 1 TABLET BY MOUTH EVERY DAY 11/15/15  Yes Wendie Agreste, MD  Omega-3 Fatty Acids (FISH OIL) 1000 MG CAPS Take 1,000 mg by mouth daily.   Yes Historical Provider, MD  polyethylene glycol (MIRALAX / GLYCOLAX) packet Take 17 g by mouth daily as needed for mild constipation.   Yes Historical Provider, MD  pravastatin (PRAVACHOL) 80 MG tablet TAKE 1 TABLET BY MOUTH EVERY DAY 11/15/15  Yes Wendie Agreste, MD  valsartan (DIOVAN) 320 MG tablet TAKE 1 TABLET BY MOUTH EVERY DAY 11/15/15  Yes Wendie Agreste, MD  vitamin B-12 (CYANOCOBALAMIN) 500 MCG tablet Take 500 mcg by mouth daily.   Yes Historical Provider, MD  vitamin C (ASCORBIC ACID) 500 MG tablet Take 500 mg by mouth daily.   Yes Historical Provider, MD  warfarin (COUMADIN) 4 MG tablet Take 4 mg by mouth daily.   Yes Historical Provider, MD    amLODipine (NORVASC) 10 MG tablet Take 1 tablet (10 mg total) by mouth daily. PATIENT NEEDS OFFICE VISIT FOR ADDITIONAL REFILLS 02/09/16   Wendie Agreste, MD   Social History   Social History  . Marital status: Divorced    Spouse name: N/A  . Number of children: 2  . Years of education: 16   Occupational History  . Retired    Social History Main Topics  . Smoking status: Current Every Day Smoker    Packs/day: 0.75    Years: 50.00    Types: Cigarettes  . Smokeless tobacco: Never Used  . Alcohol use No  . Drug use: No  . Sexual activity: Not on file   Other Topics Concern  . Not on file  Social History Narrative   Lives at home w/ his father   Divorced   Right-handed   Education: College   No caffeine   Depression screen [facility] 2/9 02/11/2016 09/08/2015 03/22/2015  Decreased Interest 0 0 0  Down, Depressed, Hopeless 0 0 0  PHQ - 2 Score 0 0 0   Review of Systems  HENT: Negative for dental problem.   Eyes: Negative for visual disturbance.  Psychiatric/Behavioral: Negative for dysphoric mood.   Objective:  Physical Exam  Constitutional: He appears well-developed and well-nourished. No distress.  HENT:  Head: Normocephalic and atraumatic.  Eyes: Conjunctivae are normal.  Neck: Neck supple. No thyromegaly present.  Cardiovascular: Normal rate, regular rhythm and normal heart sounds.  Exam reveals no gallop and no friction rub.   No murmur heard. Pulmonary/Chest: Effort normal and breath sounds normal. No respiratory distress. He has no wheezes. He has no rales.  Musculoskeletal: He exhibits no edema.  Stasis changes on his lower legs bilaterally  Neurological: He is alert.  Skin: Skin is warm and dry.  Psychiatric: He has a normal mood and affect. His behavior is normal.  Nursing note and vitals reviewed.  BP 130/64 (BP Location: Right Arm, Patient Position: Sitting, Cuff Size: Large)   Pulse 63   Temp 98.4 F (36.9 C) (Oral)   Resp 16   Ht 5\' 9"  (1.753 m)    Wt 217 lb 6.4 oz (98.6 kg)   SpO2 98%   BMI 32.10 kg/m     Assessment & Plan:   Rodney Friedman is a 70 y.o. male Need for prophylactic vaccination and inoculation against influenza - Plan: Flu Vaccine QUAD 36+ mos IM  -flu vaccine given.   Type 2 diabetes mellitus without complication, without long-term current use of insulin (HCC) - Plan: Hemoglobin A1C  - Check A1c, no change in meds for now. Will control previously, if still relatively low A1c, may decrease dose of metformin or trial off depending on reading.  Essential hypertension - Plan: COMPLETE METABOLIC PANEL WITH GFR  -Blood pressure stable. Check CMP, continue same medications.  Hyperlipidemia - Plan: Lipid panel   - Check lipid panel, continue pravastatin same dose for now. Continue routine follow-up with specialists including cardiology.  Meds ordered this encounter  Medications  . metFORMIN (GLUCOPHAGE) 500 MG tablet    Sig: Take 1 tablet (500 mg total) by mouth 2 (two) times daily.    Dispense:  180 tablet    Refill:  1  . pravastatin (PRAVACHOL) 80 MG tablet    Sig: Take 1 tablet (80 mg total) by mouth daily.    Dispense:  90 tablet    Refill:  1  . metoprolol succinate (TOPROL-XL) 100 MG 24 hr tablet    Sig: Take 1 tablet (100 mg total) by mouth daily. Take with or immediately following a meal.    Dispense:  90 tablet    Refill:  1  . amLODipine (NORVASC) 10 MG tablet    Sig: Take 1 tablet (10 mg total) by mouth daily.    Dispense:  90 tablet    Refill:  1   Patient Instructions   No change in medications for now, but if your A1c is very well controlled, we have the option of decreasing metformin to once per day or trial off metformin. I will let you know once I receive the results. Follow-up with me in the next 6 months, sooner if needed.  Bring a copy of your previous colonoscopy by  so we can determine when follow-up colonoscopy is needed. I did not do any stool cards for testing today as that may not  change the follow-up interval.    IF you received an x-ray today, you will receive an invoice from [facility]. Please contact [facility] at [phone] with questions or concerns regarding your invoice.   IF you received labwork today, you will receive an invoice from [employer]. Please contact [facility] at [phone] with questions or concerns regarding your invoice.   Our billing staff will not be able to assist you with questions regarding bills from these companies.  You will be contacted with the lab results as soon as they are available. The fastest way to get your results is to activate your My Chart account. Instructions are located on the last page of this paperwork. If you have not heard from [place] regarding the results in 2 weeks, please contact this office.    Influenza (Flu) Vaccine (Inactivated or Recombinant):  1. Why get vaccinated? Influenza ("flu") is a contagious disease that spreads around the [place] every year, usually between October and May. Flu is caused by influenza viruses, and is spread mainly by coughing, sneezing, and close contact. Anyone can get flu. Flu strikes suddenly and can last several days. Symptoms vary by age, but can include:  fever/chills  sore throat  muscle aches  fatigue  cough  headache  runny or stuffy nose Flu can also lead to pneumonia and blood infections, and cause diarrhea and seizures in children. If you have a medical condition, such as heart or lung disease, flu can make it worse. Flu is more dangerous for some people. Infants and young children, people 40 years of age and older, pregnant women, and people with certain health conditions or a weakened immune system are at greatest risk. Each year thousands of people in the [place] die from flu, and many more are hospitalized. Flu vaccine can:  keep you from getting flu,  make flu less severe if you do get it,  and  keep you from spreading flu to your family and other people. 2. Inactivated and recombinant flu vaccines A dose of flu vaccine is recommended every flu season. Children 6 months through 35 years of age may need two doses during the same flu season. Everyone else needs only one dose each flu season. Some inactivated flu vaccines contain a very small amount of a mercury-based preservative called thimerosal. Studies have not shown thimerosal in vaccines to be harmful, but flu vaccines that do not contain thimerosal are available. There is no live flu virus in flu shots. They cannot cause the flu. There are many flu viruses, and they are always changing. Each year a new flu vaccine is made to protect against three or four viruses that are likely to cause disease in the upcoming flu season. But even when the vaccine doesn't exactly match these viruses, it may still provide some protection. Flu vaccine cannot prevent:  flu that is caused by a virus not covered by the vaccine, or  illnesses that look like flu but are not. It takes about 2 weeks for protection to develop after vaccination, and protection lasts through the flu season. 3. Some people should not get this vaccine Tell the person who is giving you the vaccine:  If you have any severe, life-threatening allergies. If you ever had a life-threatening allergic reaction after a dose of flu vaccine, or have a severe allergy to  any part of this vaccine, you may be advised not to get vaccinated. Most, but not all, types of flu vaccine contain a small amount of egg protein.  If you ever had Guillain-Barre Syndrome (also called GBS). Some people with a history of GBS should not get this vaccine. This should be discussed with your doctor.  If you are not feeling well. It is usually okay to get flu vaccine when you have a mild illness, but you might be asked to come back when you feel better. 4. Risks of a vaccine reaction With any medicine,  including vaccines, there is a chance of reactions. These are usually mild and go away on their own, but serious reactions are also possible. Most people who get a flu shot do not have any problems with it. Minor problems following a flu shot include:  soreness, redness, or swelling where the shot was given  hoarseness  sore, red or itchy eyes  cough  fever  aches  headache  itching  fatigue If these problems occur, they usually begin soon after the shot and last 1 or 2 days. More serious problems following a flu shot can include the following:  There may be a small increased risk of Guillain-Barre Syndrome (GBS) after inactivated flu vaccine. This risk has been estimated at 1 or 2 additional cases per million people vaccinated. This is much lower than the risk of severe complications from flu, which can be prevented by flu vaccine.  Young children who get the flu shot along with pneumococcal vaccine (PCV13) and/or DTaP vaccine at the same time might be slightly more likely to have a seizure caused by fever. Ask your doctor for more information. Tell your doctor if a child who is getting flu vaccine has ever had a seizure. Problems that could happen after any injected vaccine:  People sometimes faint after a medical procedure, including vaccination. Sitting or lying down for about 15 minutes can help prevent fainting, and injuries caused by a fall. Tell your doctor if you feel dizzy, or have vision changes or ringing in the ears.  Some people get severe pain in the shoulder and have difficulty moving the arm where a shot was given. This happens very rarely.  Any medication can cause a severe allergic reaction. Such reactions from a vaccine are very rare, estimated at about 1 in a million doses, and would happen within a few minutes to a few hours after the vaccination. As with any medicine, there is a very remote chance of a vaccine causing a serious injury or death. The safety of  vaccines is always being monitored. For more information, visit: http://www.aguilar.org/ 5. What if there is a serious reaction? What should I look for?  Look for anything that concerns you, such as signs of a severe allergic reaction, very high fever, or unusual behavior. Signs of a severe allergic reaction can include hives, swelling of the face and throat, difficulty breathing, a fast heartbeat, dizziness, and weakness. These would start a few minutes to a few hours after the vaccination. What should I do?  If you think it is a severe allergic reaction or other emergency that can't wait, call 9-1-1 and get the person to the nearest hospital. Otherwise, call your doctor.  Reactions should be reported to the Vaccine Adverse Event Reporting System (VAERS). Your doctor should file this report, or you can do it yourself through the VAERS web site at www.vaers.SamedayNews.es, or by calling [phone]. VAERS does not give medical  advice. 6. The National Vaccine Injury Compensation Program The [employer] Vaccine Injury Compensation Program (VICP) is a federal program that was created to compensate people who may have been injured by certain vaccines. Persons who believe they may have been injured by a vaccine can learn about the program and about filing a claim by calling 418-324-3335 or visiting the [place] website at GoldCloset.com.ee. There is a time limit to file a claim for compensation. 7. How can I learn more?  Ask your healthcare provider. He or she can give you the vaccine package insert or suggest other sources of information.  Call your local or state health department.  Contact the Centers for Disease Control and Prevention (CDC):  Call 231-033-1819 (1-800-CDC-INFO) or  Visit CDC's website at https://gibson.com/ Vaccine Information Statement Inactivated Influenza Vaccine (12/26/2013)   This information is not intended to replace advice given to you by your health care  provider. Make sure you discuss any questions you have with your health care provider.   Document Released: 03/02/2006 Document Revised: 05/29/2014 Document Reviewed: 12/29/2013 Elsevier Interactive Patient Education [employer].   I personally performed the services described in this documentation, which was scribed in my presence. The recorded information has been reviewed and considered, and addended by me as needed.   Signed,   [name], MD [facility].  02/13/16 10:30 PM

## 2016-02-11 NOTE — Patient Instructions (Addendum)
No change in medications for now, but if your A1c is very well controlled, we have the option of decreasing metformin to once per day or trial off metformin. I will let you know once I receive the results. Follow-up with me in the next 6 months, sooner if needed.  Bring a copy of your previous colonoscopy by so we can determine when follow-up colonoscopy is needed. I did not do any stool cards for testing today as that may not change the follow-up interval.    IF you received an x-ray today, you will receive an invoice from [facility]. Please contact [facility] at [phone] with questions or concerns regarding your invoice.   IF you received labwork today, you will receive an invoice from [employer]. Please contact [facility] at [phone] with questions or concerns regarding your invoice.   Our billing staff will not be able to assist you with questions regarding bills from these companies.  You will be contacted with the lab results as soon as they are available. The fastest way to get your results is to activate your My Chart account. Instructions are located on the last page of this paperwork. If you have not heard from [place] regarding the results in 2 weeks, please contact this office.    Influenza (Flu) Vaccine (Inactivated or Recombinant):  1. Why get vaccinated? Influenza ("flu") is a contagious disease that spreads around the [place] every year, usually between October and May. Flu is caused by influenza viruses, and is spread mainly by coughing, sneezing, and close contact. Anyone can get flu. Flu strikes suddenly and can last several days. Symptoms vary by age, but can include:  fever/chills  sore throat  muscle aches  fatigue  cough  headache  runny or stuffy nose Flu can also lead to pneumonia and blood infections, and cause diarrhea and seizures in children. If you have a medical condition, such as heart or lung  disease, flu can make it worse. Flu is more dangerous for some people. Infants and young children, people 32 years of age and older, pregnant women, and people with certain health conditions or a weakened immune system are at greatest risk. Each year thousands of people in the [place] die from flu, and many more are hospitalized. Flu vaccine can:  keep you from getting flu,  make flu less severe if you do get it, and  keep you from spreading flu to your family and other people. 2. Inactivated and recombinant flu vaccines A dose of flu vaccine is recommended every flu season. Children 6 months through 34 years of age may need two doses during the same flu season. Everyone else needs only one dose each flu season. Some inactivated flu vaccines contain a very small amount of a mercury-based preservative called thimerosal. Studies have not shown thimerosal in vaccines to be harmful, but flu vaccines that do not contain thimerosal are available. There is no live flu virus in flu shots. They cannot cause the flu. There are many flu viruses, and they are always changing. Each year a new flu vaccine is made to protect against three or four viruses that are likely to cause disease in the upcoming flu season. But even when the vaccine doesn't exactly match these viruses, it may still provide some protection. Flu vaccine cannot prevent:  flu that is caused by a virus not covered by the vaccine, or  illnesses that look like flu but are not. It takes about 2 weeks  for protection to develop after vaccination, and protection lasts through the flu season. 3. Some people should not get this vaccine Tell the person who is giving you the vaccine:  If you have any severe, life-threatening allergies. If you ever had a life-threatening allergic reaction after a dose of flu vaccine, or have a severe allergy to any part of this vaccine, you may be advised not to get vaccinated. Most, but not all, types of flu  vaccine contain a small amount of egg protein.  If you ever had Guillain-Barre Syndrome (also called GBS). Some people with a history of GBS should not get this vaccine. This should be discussed with your doctor.  If you are not feeling well. It is usually okay to get flu vaccine when you have a mild illness, but you might be asked to come back when you feel better. 4. Risks of a vaccine reaction With any medicine, including vaccines, there is a chance of reactions. These are usually mild and go away on their own, but serious reactions are also possible. Most people who get a flu shot do not have any problems with it. Minor problems following a flu shot include:  soreness, redness, or swelling where the shot was given  hoarseness  sore, red or itchy eyes  cough  fever  aches  headache  itching  fatigue If these problems occur, they usually begin soon after the shot and last 1 or 2 days. More serious problems following a flu shot can include the following:  There may be a small increased risk of Guillain-Barre Syndrome (GBS) after inactivated flu vaccine. This risk has been estimated at 1 or 2 additional cases per million people vaccinated. This is much lower than the risk of severe complications from flu, which can be prevented by flu vaccine.  Young children who get the flu shot along with pneumococcal vaccine (PCV13) and/or DTaP vaccine at the same time might be slightly more likely to have a seizure caused by fever. Ask your doctor for more information. Tell your doctor if a child who is getting flu vaccine has ever had a seizure. Problems that could happen after any injected vaccine:  People sometimes faint after a medical procedure, including vaccination. Sitting or lying down for about 15 minutes can help prevent fainting, and injuries caused by a fall. Tell your doctor if you feel dizzy, or have vision changes or ringing in the ears.  Some people get severe pain in the  shoulder and have difficulty moving the arm where a shot was given. This happens very rarely.  Any medication can cause a severe allergic reaction. Such reactions from a vaccine are very rare, estimated at about 1 in a million doses, and would happen within a few minutes to a few hours after the vaccination. As with any medicine, there is a very remote chance of a vaccine causing a serious injury or death. The safety of vaccines is always being monitored. For more information, visit: http://www.aguilar.org/ 5. What if there is a serious reaction? What should I look for?  Look for anything that concerns you, such as signs of a severe allergic reaction, very high fever, or unusual behavior. Signs of a severe allergic reaction can include hives, swelling of the face and throat, difficulty breathing, a fast heartbeat, dizziness, and weakness. These would start a few minutes to a few hours after the vaccination. What should I do?  If you think it is a severe allergic reaction or other emergency  that can't wait, call 9-1-1 and get the person to the nearest hospital. Otherwise, call your doctor.  Reactions should be reported to the Vaccine Adverse Event Reporting System (VAERS). Your doctor should file this report, or you can do it yourself through the VAERS web site at www.vaers.SamedayNews.es, or by calling 314-542-0049. VAERS does not give medical advice. 6. The National Vaccine Injury Compensation Program The [employer] Vaccine Injury Compensation Program (VICP) is a federal program that was created to compensate people who may have been injured by certain vaccines. Persons who believe they may have been injured by a vaccine can learn about the program and about filing a claim by calling 787 274 1711 or visiting the [place] website at GoldCloset.com.ee. There is a time limit to file a claim for compensation. 7. How can I learn more?  Ask your healthcare provider. He or she can give you  the vaccine package insert or suggest other sources of information.  Call your local or state health department.  Contact the Centers for Disease Control and Prevention (CDC):  Call (248)516-6524 (1-800-CDC-INFO) or  Visit CDC's website at https://gibson.com/ Vaccine Information Statement Inactivated Influenza Vaccine (12/26/2013)   This information is not intended to replace advice given to you by your health care provider. Make sure you discuss any questions you have with your health care provider.   Document Released: 03/02/2006 Document Revised: 05/29/2014 Document Reviewed: 12/29/2013 Elsevier Interactive Patient Education [employer].

## 2016-02-12 LAB — HEMOGLOBIN A1C
Hgb A1c MFr Bld: 5.7 % — ABNORMAL HIGH (ref <5.7)
Mean Plasma Glucose: 117 mg/dL

## 2016-02-16 ENCOUNTER — Encounter: Payer: Self-pay | Admitting: Family Medicine

## 2016-02-18 ENCOUNTER — Encounter: Payer: Self-pay | Admitting: Family Medicine

## 2016-02-18 ENCOUNTER — Telehealth: Payer: Medicare Other | Admitting: Nurse Practitioner

## 2016-02-18 DIAGNOSIS — R059 Cough, unspecified: Secondary | ICD-10-CM

## 2016-02-18 DIAGNOSIS — R05 Cough: Secondary | ICD-10-CM

## 2016-02-18 DIAGNOSIS — J029 Acute pharyngitis, unspecified: Secondary | ICD-10-CM

## 2016-02-18 MED ORDER — DOXYCYCLINE HYCLATE 100 MG PO TABS: 100.0000 mg | ORAL_TABLET | Freq: Two times a day (BID) | ORAL | 0 refills | Status: DC

## 2016-02-18 NOTE — Progress Notes (Signed)
We are sorry that you are not feeling well.  Here is how we plan to help!  Based on what you have shared with me it looks like you have upper respiratory tract inflammation that has resulted in a significant cough.  Inflammation and infection in the upper respiratory tract is commonly called bronchitis and has four common causes:  Allergies, Viral Infections, Acid Reflux and Bacterial Infections.  Allergies, viruses and acid reflux are treated by controlling symptoms or eliminating the cause. An example might be a cough caused by taking certain blood pressure medications. You stop the cough by changing the medication. Another example might be a cough caused by acid reflux. Controlling the reflux helps control the cough.  Based on your presentation I believe you most likely have A cough due to bacteria.  When patients have a fever and a productive cough with a change in color or increased sputum production, we are concerned about bacterial bronchitis.  If left untreated it can progress to pneumonia.  If your symptoms do not improve with your treatment plan it is important that you contact your provider.   I hve prescribed Doxycycline 100 mg twice a day for 7 days    In addition you may use A non-prescription cough medication called Robitussin DAC. Take 2 teaspoons every 8 hours or Delsym: take 2 teaspoons every 12 hours.    HOME CARE . Only take medications as instructed by your medical team. . Complete the entire course of an antibiotic. . Drink plenty of fluids and get plenty of rest. . Avoid close contacts especially the very young and the elderly . Cover your mouth if you cough or cough into your sleeve. . Always remember to wash your hands . A steam or ultrasonic humidifier can help congestion.    GET HELP RIGHT AWAY IF: . You develop worsening fever. . You become short of breath . You cough up blood. . Your symptoms persist after you have completed your treatment plan MAKE SURE YOU    Understand these instructions.  Will watch your condition.  Will get help right away if you are not doing well or get worse.  Your e-visit answers were reviewed by a board certified advanced clinical practitioner to complete your personal care plan.  Depending on the condition, your plan could have included both over the counter or prescription medications. If there is a problem please reply  once you have received a response from your provider. Your safety is important to [place].  If you have drug allergies check your prescription carefully.    You can use MyChart to ask questions about today's visit, request a non-urgent call back, or ask for a work or school excuse for 24 hours related to this e-Visit. If it has been greater than 24 hours you will need to follow up with your provider, or enter a new e-Visit to address those concerns. You will get an e-mail in the next two days asking about your experience.  I hope that your e-visit has been valuable and will speed your recovery. Thank you for using e-visits.

## 2016-02-23 NOTE — Telephone Encounter (Signed)
Dr Carlota Raspberry, I checked to make sure that the actual pt that is being discussed here, Rodney Friedman is also your pt, which he is, but didn't move this message to his chart because son will be looking for a response here.

## 2016-02-29 DIAGNOSIS — Z8546 Personal history of malignant neoplasm of prostate: Secondary | ICD-10-CM | POA: Diagnosis not present

## 2016-03-07 DIAGNOSIS — Z8546 Personal history of malignant neoplasm of prostate: Secondary | ICD-10-CM | POA: Diagnosis not present

## 2016-03-10 ENCOUNTER — Ambulatory Visit (INDEPENDENT_AMBULATORY_CARE_PROVIDER_SITE_OTHER): Payer: Medicare Other | Admitting: Pharmacist Clinician (PhC)/ Clinical Pharmacy Specialist

## 2016-03-10 DIAGNOSIS — Z7901 Long term (current) use of anticoagulants: Secondary | ICD-10-CM

## 2016-03-10 DIAGNOSIS — E785 Hyperlipidemia, unspecified: Secondary | ICD-10-CM | POA: Diagnosis not present

## 2016-03-10 DIAGNOSIS — I480 Paroxysmal atrial fibrillation: Secondary | ICD-10-CM

## 2016-03-10 DIAGNOSIS — I482 Chronic atrial fibrillation, unspecified: Secondary | ICD-10-CM

## 2016-03-10 DIAGNOSIS — Z79899 Other long term (current) drug therapy: Secondary | ICD-10-CM | POA: Diagnosis not present

## 2016-03-10 LAB — POCT INR: INR: 2.4

## 2016-03-11 LAB — COMPREHENSIVE METABOLIC PANEL
ALT: 28 U/L (ref 9–46)
AST: 22 U/L (ref 10–35)
Albumin: 4.2 g/dL (ref 3.6–5.1)
Alkaline Phosphatase: 38 U/L — ABNORMAL LOW (ref 40–115)
BUN: 13 mg/dL (ref 7–25)
CALCIUM: 9.3 mg/dL (ref 8.6–10.3)
CHLORIDE: 105 mmol/L (ref 98–110)
CO2: 27 mmol/L (ref 20–31)
Creat: 1.16 mg/dL (ref 0.70–1.25)
GLUCOSE: 89 mg/dL (ref 65–99)
POTASSIUM: 4.7 mmol/L (ref 3.5–5.3)
Sodium: 142 mmol/L (ref 135–146)
Total Bilirubin: 0.3 mg/dL (ref 0.2–1.2)
Total Protein: 6.5 g/dL (ref 6.1–8.1)

## 2016-03-11 LAB — LIPID PANEL
CHOLESTEROL: 174 mg/dL (ref 125–200)
HDL: 30 mg/dL — ABNORMAL LOW (ref >=40)
LDL Cholesterol: 103 mg/dL (ref <130)
TRIGLYCERIDES: 207 mg/dL — AB (ref <150)
Total CHOL/HDL Ratio: 5.8 Ratio — ABNORMAL HIGH (ref <=5.0)
VLDL: 41 mg/dL — AB (ref <30)

## 2016-03-17 ENCOUNTER — Ambulatory Visit (INDEPENDENT_AMBULATORY_CARE_PROVIDER_SITE_OTHER): Payer: Medicare Other | Admitting: Psychiatry

## 2016-03-17 ENCOUNTER — Encounter (HOSPITAL_COMMUNITY): Payer: Self-pay | Admitting: Psychiatry

## 2016-03-17 VITALS — BP 126/78 | HR 60 | Ht 69.0 in | Wt 222.4 lb

## 2016-03-17 DIAGNOSIS — Z800 Family history of malignant neoplasm of digestive organs: Secondary | ICD-10-CM

## 2016-03-17 DIAGNOSIS — F3130 Bipolar disorder, current episode depressed, mild or moderate severity, unspecified: Secondary | ICD-10-CM

## 2016-03-17 DIAGNOSIS — Z79899 Other long term (current) drug therapy: Secondary | ICD-10-CM

## 2016-03-17 DIAGNOSIS — Z8042 Family history of malignant neoplasm of prostate: Secondary | ICD-10-CM | POA: Diagnosis not present

## 2016-03-17 DIAGNOSIS — Z8249 Family history of ischemic heart disease and other diseases of the circulatory system: Secondary | ICD-10-CM | POA: Diagnosis not present

## 2016-03-17 DIAGNOSIS — F17210 Nicotine dependence, cigarettes, uncomplicated: Secondary | ICD-10-CM

## 2016-03-17 DIAGNOSIS — F3162 Bipolar disorder, current episode mixed, moderate: Secondary | ICD-10-CM

## 2016-03-17 MED ORDER — LAMOTRIGINE 200 MG PO TABS: 100.0000 mg | ORAL_TABLET | Freq: Three times a day (TID) | ORAL | 7 refills | Status: DC

## 2016-03-17 MED ORDER — BUPROPION HCL ER (XL) 150 MG PO TB24: ORAL_TABLET | ORAL | 2 refills | Status: DC

## 2016-03-17 NOTE — Progress Notes (Signed)
Psychiatric Initial Adult Assessment   Patient Identification: Rodney Friedman MRN:  [id number] Date of Evaluation:  03/17/2016 Referral Source: Dr. Nyoka Cowden Chief Complaint: Need follow-up care  Visit Diagnosis: Bipolar disorder, substance use disorder   ICD-9-CM ICD-10-CM   1. Bipolar 1 disorder, mixed, moderate (HCC) 296.62 F31.62     History of Present Illness: Today the patient is doing well. His mood is good. The patient goes to AA 2 or 3 times a week and is working on step 5. He also goes to NA groups. His daughter continues to be out of control. She is a prostitute and recently has given birth to another child. There are 3 children now involved and the other 2 along with his newborn is now living with a couple that they know well. This couple was taken over the care of these children but unfortunately this couple who are adults are not well. The patient therefore supplements their income. They are on Medicaid in [facility] but they need extra funds obviously. The patient sends a significant but small some to them every month. The patient himself lives off disability and attention and lives withhis elderly father who is doing well. Generally the patient is doing well in her sobriety. His last drink was 07/22/2000. The patient denies daily depression. He is sleeping and eating very well. He's got good energy. He loves to read. 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 The patient is healthy. He denies chest pain or shortness of breath. Today he asked if we could change his site of care to my other office because it is right next to his home. This seems reasonable. Today the patient is doing well. He shows no signs of mania or depression. Once again we had a discussion of the possibility that his affect of psychiatric diagnosis occurred in the context of alcohol and drugs. He was smoking marijuana and using  alcohol intensely and I suspect this was involved around the time he was diagnosed with the psychiatric conditions. Right now he takes his medicines appropriately and claims and feels that they're helpful. In essence the accuracy of his diagnosis in my mind is not completely clear. He's never had an episode of major depression nor manic symptoms when he was sober and clean of drugs. Associated Signs/Symptoms: Depression Symptoms:   (Hypo) Manic Symptoms:   Anxiety Symptoms:   Psychotic Symptoms:   PTSD Symptoms:   Past Psychiatric History: Other psychiatrist care for over 20 years taking mainly Lamictal and Wellbutrin  Previous Psychotropic Medications: Lamictal 300 mg Wellbutrin 150 Substance Abuse History in the last 12 months:  Yes.    Consequences of Substance Abuse: NA  Past Medical History:  Past Medical History:  Diagnosis Date  . Anxiety   . Cataract   . 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 Depression   . Diabetes mellitus without complication ([place])   . Eye abnormality    Macular scarring R eye  . Glaucoma   . Heart murmur   . Hypertension   . Leukemia ([place])    CLL  . Myocardial infarction   . Prostate cancer ([place])   . Prostate cancer ([place]) 07/08/2013  . Substance abuse   . 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 COLONOSCOPY    . 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 EYE SURGERY  Family Psychiatric History:   Family History:  Family History  Problem Relation Age of Onset  . Cancer Mother     colon cancer  . Cancer Father     prostate ca  . Hypertension Father     Social History:   Social History   Social History  . Marital status: Divorced    Spouse name: N/A  . Number of children: 2  . Years of education: 16   Occupational History  . Retired    Social History Main Topics  . Smoking status: Current Every Day Smoker    Packs/day: 0.75    Years: 50.00    Types: Cigarettes   . Smokeless tobacco: Never Used  . Alcohol use No  . Drug use: No  . Sexual activity: Not Asked   Other Topics Concern  . None   Social History Narrative   Lives at home w/ his father   Divorced   Right-handed   Education: College   No caffeine    Additional Social History:   Allergies:   Allergies  Allergen Reactions  . Other Rash    Allergen: "Plants and bushes while doing yard work"    Metabolic Disorder Labs: Lab Results  Component Value Date   HGBA1C 5.7 (H) 02/11/2016   MPG 117 02/11/2016   MPG 128 (H) 03/22/2015   No results found for: PROLACTIN Lab Results  Component Value Date   CHOL 174 03/10/2016   TRIG 207 (H) 03/10/2016   HDL 30 (L) 03/10/2016   CHOLHDL 5.8 (H) 03/10/2016   VLDL 41 (H) 03/10/2016   LDLCALC 103 03/10/2016   LDLCALC 104 02/11/2016     Current Medications: Current Outpatient Prescriptions  Medication Sig Dispense Refill  . amLODipine (NORVASC) 10 MG tablet Take 1 tablet (10 mg total) by mouth daily. 90 tablet 1  . B Complex Vitamins (VITAMIN-B COMPLEX PO) Take by mouth.    Marland Kitchen BIOTIN PO Take by mouth.    Marland Kitchen buPROPion (WELLBUTRIN XL) 150 MG 24 hr tablet 1  qam 90 tablet 2  . Calcium Carbonate-Vit D-Min (CALCIUM 1200 PO) Take 1,200 mg by mouth daily.    Marland Kitchen doxycycline (VIBRA-TABS) 100 MG tablet Take 1 tablet (100 mg total) by mouth 2 (two) times daily. 1 po bid 20 tablet 0  . lamoTRIgine (LAMICTAL) 200 MG tablet Take 0.5 tablets (100 mg total) by mouth 3 (three) times daily. 30 tablet 7  . latanoprost (XALATAN) 0.005 % ophthalmic solution Once a day every third day    . LYCOPENE PO Take 8 mg by mouth daily.    . metFORMIN (GLUCOPHAGE) 500 MG tablet Take 1 tablet (500 mg total) by mouth 2 (two) times daily. 180 tablet 1  . metoprolol succinate (TOPROL-XL) 100 MG 24 hr tablet Take 1 tablet (100 mg total) by mouth daily. Take with or immediately following a meal. 90 tablet 1  . Omega-3 Fatty Acids (FISH OIL) 1000 MG CAPS Take 1,000 mg by  mouth daily.    . polyethylene glycol (MIRALAX / GLYCOLAX) packet Take 17 g by mouth daily as needed for mild constipation.    . pravastatin (PRAVACHOL) 80 MG tablet Take 1 tablet (80 mg total) by mouth daily. 90 tablet 1  . valsartan (DIOVAN) 320 MG tablet TAKE 1 TABLET BY MOUTH EVERY DAY 90 tablet 2  . vitamin B-12 (CYANOCOBALAMIN) 500 MCG tablet Take 500 mcg by mouth daily.    . vitamin C (ASCORBIC ACID) 500 MG tablet Take 500 mg  by mouth daily.    Marland Kitchen warfarin (COUMADIN) 4 MG tablet Take 4 mg by mouth daily.     No current facility-administered medications for this visit.     Neurologic: Headache: No Seizure: No Paresthesias:No  Musculoskeletal: Strength & Muscle Tone: within normal limits Gait & Station: normal Patient leans: Right  Psychiatric Specialty Exam: ROS  Blood pressure 126/78, pulse 60, height 5\' 9"  (1.753 m), weight 222 lb 6.4 oz (100.9 kg).Body mass index is 32.84 kg/m.  General Appearance: Casual  Eye Contact:  Good  Speech:  Clear and Coherent  Volume:  Normal  Mood:  NA  Affect:  Appropriate  Thought Process:  Goal Directed  Orientation:  NA  Thought Content:  WDL  Suicidal Thoughts:  No  Homicidal Thoughts:  No  Memory:  Negative  Judgement:  Good  Insight:  Good  Psychomotor Activity:  Normal  Concentration:    Recall:  [place] of Knowledge:Good  Language: Good  Akathisia:  No  Handed:  Right  AIMS (if indicated):    Assets:  Desire for Improvement  ADL's:  Intact  Cognition: WNL  Sleep:      Treatment Plan Summary: At this time the patient will continue taking Wellbutrin but will change it. The patient was taking Wellbutrin slow release was supposed to be taking 2 a day but he wasn't. He said a long time ago he changed to just taking one in the morning. So regarding continue as well as an 150 but changing from the slow release preparation to extended release. So he'll receive a prescription for Wellbutrin 150 XL every morning. He also was  taking an excessive amount of Lamictal. He was taking 300 mg. And that he took 100 mg pill 3 at night. Today we'll go to change that to Lamictal 200 mg and will just taking one of those. Still taking one Lamictal at night and one Wellbutrin the morning and he should be good. I strongly encouraged him to stay in [place] and he has a good sponsor. The patient used to be in psychotherapy many many years ago but at this time he thinks he can deal with a lot of is passed through a and I think that's perfectly reasonable. The patient is very stable this time. He will make arrangements to see me at a different site in 3 months. He understood that there are any problems up until he sees me at a different setting he can always call here.  Haskel Schroeder, MD 10/27/20179:25 AM

## 2016-04-05 ENCOUNTER — Other Ambulatory Visit: Payer: Self-pay | Admitting: Cardiovascular Disease

## 2016-04-05 DIAGNOSIS — I25709 Atherosclerosis of coronary artery bypass graft(s), unspecified, with unspecified angina pectoris: Secondary | ICD-10-CM

## 2016-04-07 ENCOUNTER — Ambulatory Visit (HOSPITAL_COMMUNITY): Payer: Self-pay | Admitting: Psychiatry

## 2016-04-19 ENCOUNTER — Other Ambulatory Visit: Payer: Self-pay | Admitting: Family Medicine

## 2016-04-21 ENCOUNTER — Encounter: Payer: Self-pay | Admitting: Cardiovascular Disease

## 2016-04-21 ENCOUNTER — Ambulatory Visit (INDEPENDENT_AMBULATORY_CARE_PROVIDER_SITE_OTHER): Payer: Medicare Other | Admitting: Cardiovascular Disease

## 2016-04-21 VITALS — BP 110/62 | HR 57 | Ht 69.0 in | Wt 219.0 lb

## 2016-04-21 DIAGNOSIS — R0989 Other specified symptoms and signs involving the circulatory and respiratory systems: Secondary | ICD-10-CM

## 2016-04-21 DIAGNOSIS — I209 Angina pectoris, unspecified: Secondary | ICD-10-CM

## 2016-04-21 DIAGNOSIS — I25708 Atherosclerosis of coronary artery bypass graft(s), unspecified, with other forms of angina pectoris: Secondary | ICD-10-CM | POA: Diagnosis not present

## 2016-04-21 DIAGNOSIS — Z720 Tobacco use: Secondary | ICD-10-CM

## 2016-04-21 DIAGNOSIS — I350 Nonrheumatic aortic (valve) stenosis: Secondary | ICD-10-CM

## 2016-04-21 DIAGNOSIS — I480 Paroxysmal atrial fibrillation: Secondary | ICD-10-CM | POA: Insufficient documentation

## 2016-04-21 DIAGNOSIS — E119 Type 2 diabetes mellitus without complications: Secondary | ICD-10-CM | POA: Insufficient documentation

## 2016-04-21 DIAGNOSIS — E782 Mixed hyperlipidemia: Secondary | ICD-10-CM

## 2016-04-21 DIAGNOSIS — I6529 Occlusion and stenosis of unspecified carotid artery: Secondary | ICD-10-CM | POA: Diagnosis not present

## 2016-04-21 DIAGNOSIS — Z7901 Long term (current) use of anticoagulants: Secondary | ICD-10-CM | POA: Diagnosis not present

## 2016-04-21 DIAGNOSIS — G4733 Obstructive sleep apnea (adult) (pediatric): Secondary | ICD-10-CM | POA: Insufficient documentation

## 2016-04-21 MED ORDER — METFORMIN HCL 500 MG PO TABS: 500.0000 mg | ORAL_TABLET | Freq: Every evening | ORAL | 3 refills | Status: DC

## 2016-04-21 NOTE — Patient Instructions (Signed)
Dr Sallyanne Kuster has recommended making the following medication changes: 1. DECREASE Metformin to 1 tablet at night  Your physician recommends that you schedule a follow-up appointment in 1 year. You will receive a reminder letter in the mail two months in advance. If you don't receive a letter, please call our office to schedule the follow-up appointment.  If you need a refill on your cardiac medications before your next appointment, please call your pharmacy.   Dr C recommended discussing with your dentist a dental device for sleep apnea.

## 2016-04-21 NOTE — Progress Notes (Addendum)
Cardiology Office Note    Date:  04/21/2016   ID:  Rodney Friedman, DOB Jun 02, 1945, MRN [id number]  PCP:  Wendie Agreste, MD  Cardiologist:   Sanda Klein, MD   chief complaint: daytime fatigue   History of Present Illness:  Rodney Friedman is a 70 y.o. male with CAD and remote CABG, paroxysmal atrial fibrillation, mild aortic stenosis, carotid artery stenosis, hypertension, hyperlipidemia and type 2 diabetes mellitus.  He still performs all his household and yard chores, without complaints of angina or dyspnea. He describes frustration with afternoon fatigue when he has to take his "grandpa nap". If he doesn't he feels exhausted. He has previously been diagnosed with obstructive sleep apnea but after a long struggle gave up when using CPAP. Could never keep the mask on at night because he tosses and turns a lot.  He complains about not drinking enough water. Ever since taking metformin has noticed that if he drinks a full glass of water he has immediate diarrhea. The other hand his most recent hemoglobin A1c was excellent at 5.7%. He remains mildly obese.  He has not had any rapid palpitations, syncope, dizziness, diaphoresis, profound weakness (symptoms he associated with his previous episode of paroxysmal atrial fibrillation).  He has not had any bleeding problems on warfarin. Last year we planned switching to a direct oral anticoagulant, but he retreated because of the cost.  He has a history of coronary disease and underwent 3 vessel bypass surgery in [place] in 2006 (" heartburn" and left antecubital pain ) in July 2014 he had an episode of atrial fibrillation. He has significant left ventricular hypertrophy and very mild aortic valve stenosis. He has preserved left ventricular systolic function although there is some degree of anterior hypokinesis by previous echo. His nuclear stress test in June 2013 showed normal pattern of perfusion and an ejection fraction of 52%. He has  bilateral carotid bruits that are reportedly due to external carotid artery stenoses. His last carotid ultrasound was performed in September 2017 and showed no significant internal carotid stenosis.  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 He has treated prostate cancer and is followed by Dr. Alvy Bimler.  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 Unfortunately he still smokes roughly 10 cigarettes a day , which he believes he needs due to anxiety.   Past Medical History:  Diagnosis Date  . Anxiety   . Cataract   . CLL (chronic lymphocytic leukemia) ([place]) 07/08/2013  . Depression   . Diabetes mellitus without complication ([place])   . Eye abnormality    Macular scarring R eye  . Glaucoma   . Heart murmur   . Hypertension   . Leukemia ([place])    CLL  . Myocardial infarction   . Prostate cancer ([place])   . Prostate cancer ([place]) 07/08/2013  . Substance abuse   . 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 COLONOSCOPY    . 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 EYE SURGERY      Current Medications: Outpatient Medications Prior to Visit  Medication Sig Dispense Refill  . amLODipine (NORVASC) 10 MG tablet Take 1 tablet (10 mg total) by mouth daily. 90 tablet 1  . B Complex Vitamins (VITAMIN-B COMPLEX PO) Take by mouth.    Marland Kitchen BIOTIN PO Take  by mouth.    Marland Kitchen buPROPion (WELLBUTRIN XL) 150 MG 24 hr tablet 1  qam 90 tablet 2  . Calcium Carbonate-Vit D-Min (CALCIUM 1200 PO) Take 1,200 mg by mouth daily.    Marland Kitchen lamoTRIgine (LAMICTAL) 200 MG tablet Take 0.5 tablets (100 mg total) by mouth 3 (three) times daily. 30 tablet 7  . latanoprost (XALATAN) 0.005 % ophthalmic solution Once a day every third day    . LYCOPENE PO Take 8 mg by mouth daily.    . metoprolol succinate (TOPROL-XL) 100 MG 24 hr tablet Take 1 tablet (100 mg total)  by mouth daily. Take with or immediately following a meal. 90 tablet 1  . Omega-3 Fatty Acids (FISH OIL) 1000 MG CAPS Take 1,000 mg by mouth daily.    . polyethylene glycol (MIRALAX / GLYCOLAX) packet Take 17 g by mouth daily as needed for mild constipation.    . pravastatin (PRAVACHOL) 80 MG tablet Take 1 tablet (80 mg total) by mouth daily. 90 tablet 1  . valsartan (DIOVAN) 320 MG tablet TAKE 1 TABLET BY MOUTH EVERY DAY 90 tablet 2  . vitamin B-12 (CYANOCOBALAMIN) 500 MCG tablet Take 500 mcg by mouth daily.    . vitamin C (ASCORBIC ACID) 500 MG tablet Take 500 mg by mouth daily.    Marland Kitchen warfarin (COUMADIN) 4 MG tablet Take 4 mg by mouth daily.    Marland Kitchen warfarin (COUMADIN) 4 MG tablet TAKE 1 TO 1 & 1/2 TABLETS BY MOUTH EVERY DAY AS DIRECTED BY COUMADIN CLINIC 120 tablet 0  . metFORMIN (GLUCOPHAGE) 500 MG tablet Take 1 tablet (500 mg total) by mouth 2 (two) times daily. 180 tablet 1  . doxycycline (VIBRA-TABS) 100 MG tablet Take 1 tablet (100 mg total) by mouth 2 (two) times daily. 1 po bid (Patient not taking: Reported on 04/21/2016) 20 tablet 0   No facility-administered medications prior to visit.      Allergies:   Other   Social History   Social History  . Marital status: Divorced    Spouse name: N/A  . Number of children: 2  . Years of education: 16   Occupational History  . Retired    Social History Main Topics  . Smoking status: Current Every Day Smoker    Packs/day: 0.75    Years: 50.00    Types: Cigarettes  . Smokeless tobacco: Never Used  . Alcohol use No  . Drug use: No  . Sexual activity: Not Asked   Other Topics Concern  . None   Social History Narrative   Lives at home w/ his father   Divorced   Right-handed   Education: College   No caffeine     Family History:  The patient's family history includes Cancer in his father and mother; Hypertension in his father.   ROS:   Please see the history of present illness.    ROS All other systems reviewed and are  negative.   PHYSICAL EXAM:   VS:  BP 110/62 (BP Location: Left Arm, Patient Position: Sitting, Cuff Size: Normal)   Pulse (!) 57   Ht 5\' 9"  (1.753 m)   Wt 219 lb (99.3 kg)   BMI 32.34 kg/m    GEN: Well nourished, well developed, in no acute distress  HEENT: normal  Neck: no JVD or masses, mild bilateral carotid bruits Cardiac: RRR; 2/6 early peaking aortic ejection murmur, no diastolic murmurs, rubs, or gallops,no edema  Respiratory:  clear to auscultation bilaterally, normal work of  breathing GI: soft, nontender, nondistended, + BS MS: no deformity or atrophy  Skin: warm and dry, no rash Neuro:  Alert and Oriented x 3, Strength and sensation are intact Psych: euthymic mood, full affect  Wt Readings from Last 3 Encounters:  04/21/16 219 lb (99.3 kg)  02/11/16 217 lb 6.4 oz (98.6 kg)  01/03/16 220 lb 8 oz (100 kg)      Studies/Labs Reviewed:   EKG:  EKG is ordered today.  The ekg ordered today demonstrates Mild sinus bradycardia, mild first-degree AV block 228 ms, left ventricular hypertrophy with QRS widening and prominent repolarization abnormalities,  QRS 120 ms, normal QTC 438 ms  Recent Labs: 09/08/2015: TSH 2.23 01/03/2016: HGB 15.4; Platelets 199 03/10/2016: ALT 28; BUN 13; Creat 1.16; Potassium 4.7; Sodium 142   Lipid Panel    Component Value Date/Time   CHOL 174 03/10/2016 0830   TRIG 207 (H) 03/10/2016 0830   HDL 30 (L) 03/10/2016 0830   CHOLHDL 5.8 (H) 03/10/2016 0830   VLDL 41 (H) 03/10/2016 0830   LDLCALC 103 03/10/2016 0830    ASSESSMENT:    1. Paroxysmal atrial fibrillation (HCC)   2. Long term current use of anticoagulant therapy   3. Coronary artery disease of bypass graft of native heart with stable angina pectoris ([place])   4. Mild aortic valve stenosis   5. Mixed hyperlipidemia   6. Type 2 diabetes mellitus without complication, without long-term current use of insulin (HCC)   7. Tobacco abuse   8. Bilateral carotid bruits without stenosis   9.  OSA (obstructive sleep apnea)      PLAN:  In order of problems listed above:  1. AFib: asymptomatic. CHADSVasc 4 (age, HTN, CAD, DM) 2. Warfarin: Well-tolerated, no bleeding complications. 3. CAD: CCS class I-II angina with exertion or exertional equivalents, very infrequent episodes 4. AS: Mild, not yet hemodynamically relevant 5. HLP: Lipids not at target on current medication. Ideally LDL under 70. Switching to more potent statin will help, but he needs to lose weight to see improvement in triglycerides and HDL. Weight loss is the most important way to achieve this. He is worried about worsening diabetes mellitus if he switches to a more potent statin. 6. DM: diet controlled. He is having side effects (diarrhea) with metformin, I recommended that he take it only at night since his A1c is excellent. Discussed again that he should avoid sweets and carbohydrates of high glycemic index. 7. Smoking: He does not think he can stop smoking. 8. Carotid bruits: external carotid stenoses. He has bilateral carotid bruits and mild carotid plaque without obstruction. 9. OSA: Explained to him that this is the reason for his daytime hypersomnolence and that sleep apnea has other adverse consequences as well. He is not willing to try CPAP again, but will talk to his dentist about referral for a dental jaw advancement device. 10. CLL:  Not requiring treatment. No anemia or thrombocytopenia    Medication Adjustments/Labs and Tests Ordered: Current medicines are reviewed at length with the patient today.  Concerns regarding medicines are outlined above.  Medication changes, Labs and Tests ordered today are listed in the Patient Instructions below. Patient Instructions  Dr Sallyanne Kuster has recommended making the following medication changes: 1. DECREASE Metformin to 1 tablet at night  Your physician recommends that you schedule a follow-up appointment in 1 year. You will receive a reminder letter in the mail  two months in advance. If you don't receive a letter, please call our office  to schedule the follow-up appointment.  If you need a refill on your cardiac medications before your next appointment, please call your pharmacy.   Dr C recommended discussing with your dentist a dental device for sleep apnea.    Signed, Sanda Klein, MD  04/21/2016 8:56 AM    [facility] [place], [place], [address]  [address] Phone: [phone]; Fax: [phone]

## 2016-05-05 ENCOUNTER — Other Ambulatory Visit: Payer: Self-pay | Admitting: Family Medicine

## 2016-05-11 ENCOUNTER — Ambulatory Visit (HOSPITAL_COMMUNITY): Payer: Self-pay | Admitting: Psychiatry

## 2016-06-20 DIAGNOSIS — H31011 Macula scars of posterior pole (postinflammatory) (post-traumatic), right eye: Secondary | ICD-10-CM | POA: Diagnosis not present

## 2016-06-20 DIAGNOSIS — Z961 Presence of intraocular lens: Secondary | ICD-10-CM | POA: Diagnosis not present

## 2016-06-20 DIAGNOSIS — H401132 Primary open-angle glaucoma, bilateral, moderate stage: Secondary | ICD-10-CM | POA: Diagnosis not present

## 2016-06-20 DIAGNOSIS — H2513 Age-related nuclear cataract, bilateral: Secondary | ICD-10-CM | POA: Diagnosis not present

## 2016-06-20 DIAGNOSIS — H35713 Central serous chorioretinopathy, bilateral: Secondary | ICD-10-CM | POA: Diagnosis not present

## 2016-06-26 ENCOUNTER — Other Ambulatory Visit: Payer: Self-pay | Admitting: Cardiovascular Disease

## 2016-06-26 DIAGNOSIS — I25709 Atherosclerosis of coronary artery bypass graft(s), unspecified, with unspecified angina pectoris: Secondary | ICD-10-CM

## 2016-06-26 NOTE — Telephone Encounter (Signed)
Patient last INR done in October/2017. Need INR ASAP. LMOM to call back and make appointment for follow up as earlier convenience

## 2016-07-07 ENCOUNTER — Encounter (HOSPITAL_COMMUNITY): Payer: Self-pay | Admitting: Psychiatry

## 2016-07-07 ENCOUNTER — Ambulatory Visit (INDEPENDENT_AMBULATORY_CARE_PROVIDER_SITE_OTHER): Payer: Medicare Other | Admitting: Psychiatry

## 2016-07-07 VITALS — BP 128/76 | HR 67 | Ht 69.0 in | Wt 222.4 lb

## 2016-07-07 DIAGNOSIS — Z8249 Family history of ischemic heart disease and other diseases of the circulatory system: Secondary | ICD-10-CM

## 2016-07-07 DIAGNOSIS — Z800 Family history of malignant neoplasm of digestive organs: Secondary | ICD-10-CM

## 2016-07-07 DIAGNOSIS — Z9109 Other allergy status, other than to drugs and biological substances: Secondary | ICD-10-CM | POA: Diagnosis not present

## 2016-07-07 DIAGNOSIS — F319 Bipolar disorder, unspecified: Secondary | ICD-10-CM

## 2016-07-07 DIAGNOSIS — I25709 Atherosclerosis of coronary artery bypass graft(s), unspecified, with unspecified angina pectoris: Secondary | ICD-10-CM

## 2016-07-07 DIAGNOSIS — Z8042 Family history of malignant neoplasm of prostate: Secondary | ICD-10-CM | POA: Diagnosis not present

## 2016-07-07 DIAGNOSIS — F17210 Nicotine dependence, cigarettes, uncomplicated: Secondary | ICD-10-CM

## 2016-07-07 DIAGNOSIS — Z79899 Other long term (current) drug therapy: Secondary | ICD-10-CM | POA: Diagnosis not present

## 2016-07-07 MED ORDER — BUPROPION HCL ER (XL) 150 MG PO TB24: ORAL_TABLET | ORAL | 2 refills | Status: DC

## 2016-07-07 NOTE — Progress Notes (Signed)
Psychiatric Initial Adult Assessment   Patient Identification: Rodney Friedman MRN:  [id number] Date of Evaluation:  07/07/2016 Referral Source: Dr. Nyoka Cowden Chief Complaint: Need follow-up care  Visit Diagnosis: Bipolar disorder, substance use disorder No diagnosis found.  History of Present Illness: Today patient is seen on time. The patient is doing actually very well. He takes care of his 17 she will father who actually is doing fairly well himself the patient is well-dressed is in good spirits he is sleeping and eating well. He denies daily depression. His biggest stresses is an IRS bill. He gambled in [place] on some money and now has to pay taxes on in [place]. The patient goes to NA and AA on a regular basis. He's been sober for 16 years. 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 He reads watches TV. He obviously uses no drugs or alcohol. Is no evidence of psychosis. Physically he is very well. He denies chest pain or shortness of breath. It should be noted that he does have coronary artery disease has had bypass surgery but he is very stable this time. He sees his cardiologist regular. Once again we reviewed the fact that any emotional distress he had was when he was using drugs and alcohol. It is diagnosis of bipolar disorder is not I believe accurate. I'm not clear about clinical depression in this individual but certainly is never had a clear episode of mania when he uses it wasn't using drugs or alcohol. Psychotic Symptoms:   PTSD Symptoms:   Past Psychiatric History: Other psychiatrist care for over 20 years taking mainly Lamictal and Wellbutrin  Previous Psychotropic Medications: Lamictal 300 mg Wellbutrin 150 Substance Abuse History in the last 12 months:  Yes.    Consequences of Substance Abuse: NA  Past Medical History:  Past Medical History:  Diagnosis Date  . Anxiety   . Cataract   . CLL (chronic lymphocytic leukemia) ([place]) 07/08/2013  . Depression   .  Diabetes mellitus without complication ([place])   . Eye abnormality    Macular scarring R eye  . Glaucoma   . Heart murmur   . Hypertension   . Leukemia ([place])    CLL  . Myocardial infarction   . Prostate cancer ([place])   . Prostate cancer ([place]) 07/08/2013  . Substance abuse   . 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 COLONOSCOPY    . 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 EYE SURGERY      Family Psychiatric History:   Family History:  Family History  Problem Relation Age of Onset  . Cancer Mother     colon cancer  . Cancer Father     prostate ca  . Hypertension Father     Social History:   Social History   Social History  . Marital status: Divorced    Spouse name: N/A  . Number of children: 2  . Years of education: 16   Occupational History  . Retired    Social History Main Topics  . Smoking status: Current Every Day Smoker    Packs/day: 0.75    Years: 50.00    Types: Cigarettes  . Smokeless tobacco: Never Used  . Alcohol use No  . Drug use: No  . Sexual activity: Not Asked   Other Topics Concern  . None   Social History Narrative   Lives at home w/ his father   Divorced   Right-handed  Education: College   No caffeine    Additional Social History:   Allergies:   Allergies  Allergen Reactions  . Other Rash    Allergen: "Plants and bushes while doing yard work"    Metabolic Disorder Labs: Lab Results  Component Value Date   HGBA1C 5.7 (H) 02/11/2016   MPG 117 02/11/2016   MPG 128 (H) 03/22/2015   No results found for: PROLACTIN Lab Results  Component Value Date   CHOL 174 03/10/2016   TRIG 207 (H) 03/10/2016   HDL 30 (L) 03/10/2016   CHOLHDL 5.8 (H) 03/10/2016   VLDL 41 (H) 03/10/2016   LDLCALC 103 03/10/2016   LDLCALC 104 02/11/2016     Current Medications: Current Outpatient Prescriptions  Medication Sig Dispense Refill  . amLODipine (NORVASC) 10 MG tablet Take 1 tablet (10 mg  total) by mouth daily. 90 tablet 1  . B Complex Vitamins (VITAMIN-B COMPLEX PO) Take by mouth.    Marland Kitchen BIOTIN PO Take by mouth.    Marland Kitchen buPROPion (WELLBUTRIN XL) 150 MG 24 hr tablet 1  qam 90 tablet 2  . Calcium Carbonate-Vit D-Min (CALCIUM 1200 PO) Take 1,200 mg by mouth daily.    Marland Kitchen lamoTRIgine (LAMICTAL) 200 MG tablet Take 0.5 tablets (100 mg total) by mouth 3 (three) times daily. 30 tablet 7  . latanoprost (XALATAN) 0.005 % ophthalmic solution Once a day every third day    . LYCOPENE PO Take 8 mg by mouth daily.    . metFORMIN (GLUCOPHAGE) 500 MG tablet Take 1 tablet (500 mg total) by mouth every evening. 90 tablet 3  . metoprolol succinate (TOPROL-XL) 100 MG 24 hr tablet Take 1 tablet (100 mg total) by mouth daily. Take with or immediately following a meal. 90 tablet 1  . Omega-3 Fatty Acids (FISH OIL) 1000 MG CAPS Take 1,000 mg by mouth daily.    . polyethylene glycol (MIRALAX / GLYCOLAX) packet Take 17 g by mouth daily as needed for mild constipation.    . pravastatin (PRAVACHOL) 80 MG tablet Take 1 tablet (80 mg total) by mouth daily. 90 tablet 1  . valsartan (DIOVAN) 320 MG tablet TAKE 1 TABLET BY MOUTH EVERY DAY 90 tablet 2  . vitamin B-12 (CYANOCOBALAMIN) 500 MCG tablet Take 500 mcg by mouth daily.    . vitamin C (ASCORBIC ACID) 500 MG tablet Take 500 mg by mouth daily.    Marland Kitchen warfarin (COUMADIN) 4 MG tablet Take 4 mg by mouth daily.    Marland Kitchen warfarin (COUMADIN) 4 MG tablet TAKE 1 TO 1 & 1/2 TABLETS BY MOUTH EVERY DAY AS DIRECTED BY COUMADIN CLINIC 120 tablet 0   No current facility-administered medications for this visit.     Neurologic: Headache: No Seizure: No Paresthesias:No  Musculoskeletal: Strength & Muscle Tone: within normal limits Gait & Station: normal Patient leans: Right  Psychiatric Specialty Exam: ROS  Blood pressure 128/76, pulse 67, height 5\' 9"  (1.753 m), weight 222 lb 6.4 oz (100.9 kg).Body mass index is 32.84 kg/m.  General Appearance: Casual  Eye Contact:  Good   Speech:  Clear and Coherent  Volume:  Normal  Mood:  NA  Affect:  Appropriate  Thought Process:  Goal Directed  Orientation:  NA  Thought Content:  WDL  Suicidal Thoughts:  No  Homicidal Thoughts:  No  Memory:  Negative  Judgement:  Good  Insight:  Good  Psychomotor Activity:  Normal  Concentration:    Recall:  [place] of Knowledge:Good  Language:  Good  Akathisia:  No  Handed:  Right  AIMS (if indicated):    Assets:  Desire for Improvement  ADL's:  Intact  Cognition: WNL  Sleep:      Treatment Plan Summary: Today the patient notes no difference I reducing his Lamictal from 300 mg.200 mg. Today we had a long discussion about his diagnosis and he's agreed to reduce his Lamictal down to 100 mg for one month and then if he notes no difference. He'll continue taking Wellbutrin 150 mg as prescribed. I will have a much higher threshold to get rid of his Wellbutrin but I think this patient is doing great he is sober and active. He is very compliant. Bowels program well. He is not suicidal. He demonstrates no significant psychopathology. Is active and energetic. Is functioning extremely well.  Haskel Schroeder, MD 2/16/20189:09 AM

## 2016-07-19 DIAGNOSIS — H401114 Primary open-angle glaucoma, right eye, indeterminate stage: Secondary | ICD-10-CM | POA: Diagnosis not present

## 2016-07-19 DIAGNOSIS — H401122 Primary open-angle glaucoma, left eye, moderate stage: Secondary | ICD-10-CM | POA: Diagnosis not present

## 2016-07-19 NOTE — Telephone Encounter (Signed)
Coumadin clinic appointment 07/21/16

## 2016-07-21 ENCOUNTER — Ambulatory Visit (INDEPENDENT_AMBULATORY_CARE_PROVIDER_SITE_OTHER): Payer: Medicare Other | Admitting: Pharmacist Clinician (PhC)/ Clinical Pharmacy Specialist

## 2016-07-21 DIAGNOSIS — I480 Paroxysmal atrial fibrillation: Secondary | ICD-10-CM | POA: Diagnosis not present

## 2016-07-21 DIAGNOSIS — Z7901 Long term (current) use of anticoagulants: Secondary | ICD-10-CM

## 2016-07-21 DIAGNOSIS — I25709 Atherosclerosis of coronary artery bypass graft(s), unspecified, with unspecified angina pectoris: Secondary | ICD-10-CM

## 2016-07-21 DIAGNOSIS — I482 Chronic atrial fibrillation, unspecified: Secondary | ICD-10-CM

## 2016-07-21 LAB — POCT INR: INR: 3.7

## 2016-07-31 DIAGNOSIS — L820 Inflamed seborrheic keratosis: Secondary | ICD-10-CM | POA: Diagnosis not present

## 2016-07-31 DIAGNOSIS — L219 Seborrheic dermatitis, unspecified: Secondary | ICD-10-CM | POA: Diagnosis not present

## 2016-07-31 DIAGNOSIS — L570 Actinic keratosis: Secondary | ICD-10-CM | POA: Diagnosis not present

## 2016-08-18 ENCOUNTER — Ambulatory Visit (INDEPENDENT_AMBULATORY_CARE_PROVIDER_SITE_OTHER): Payer: Medicare Other | Admitting: Pharmacist Clinician (PhC)/ Clinical Pharmacy Specialist

## 2016-08-18 DIAGNOSIS — I480 Paroxysmal atrial fibrillation: Secondary | ICD-10-CM | POA: Diagnosis not present

## 2016-08-18 DIAGNOSIS — Z7901 Long term (current) use of anticoagulants: Secondary | ICD-10-CM

## 2016-08-18 DIAGNOSIS — I25709 Atherosclerosis of coronary artery bypass graft(s), unspecified, with unspecified angina pectoris: Secondary | ICD-10-CM

## 2016-08-18 DIAGNOSIS — I482 Chronic atrial fibrillation, unspecified: Secondary | ICD-10-CM

## 2016-08-18 LAB — POCT INR: INR: 3.3

## 2016-08-29 DIAGNOSIS — C61 Malignant neoplasm of prostate: Secondary | ICD-10-CM | POA: Diagnosis not present

## 2016-09-05 DIAGNOSIS — Z8546 Personal history of malignant neoplasm of prostate: Secondary | ICD-10-CM | POA: Diagnosis not present

## 2016-09-20 ENCOUNTER — Other Ambulatory Visit: Payer: Self-pay | Admitting: Family Medicine

## 2016-09-22 ENCOUNTER — Ambulatory Visit (INDEPENDENT_AMBULATORY_CARE_PROVIDER_SITE_OTHER): Payer: Medicare Other | Admitting: Pharmacist Clinician (PhC)/ Clinical Pharmacy Specialist

## 2016-09-22 DIAGNOSIS — I25709 Atherosclerosis of coronary artery bypass graft(s), unspecified, with unspecified angina pectoris: Secondary | ICD-10-CM

## 2016-09-22 DIAGNOSIS — Z7901 Long term (current) use of anticoagulants: Secondary | ICD-10-CM

## 2016-09-22 DIAGNOSIS — I482 Chronic atrial fibrillation, unspecified: Secondary | ICD-10-CM

## 2016-09-22 DIAGNOSIS — I480 Paroxysmal atrial fibrillation: Secondary | ICD-10-CM | POA: Diagnosis not present

## 2016-09-22 LAB — POCT INR: INR: 2.8

## 2016-10-06 ENCOUNTER — Ambulatory Visit (INDEPENDENT_AMBULATORY_CARE_PROVIDER_SITE_OTHER): Payer: Medicare Other | Admitting: Psychiatry

## 2016-10-06 ENCOUNTER — Encounter (HOSPITAL_COMMUNITY): Payer: Self-pay | Admitting: Psychiatry

## 2016-10-06 VITALS — BP 110/62 | HR 61 | Wt 225.0 lb

## 2016-10-06 DIAGNOSIS — F319 Bipolar disorder, unspecified: Secondary | ICD-10-CM | POA: Diagnosis not present

## 2016-10-06 DIAGNOSIS — I25709 Atherosclerosis of coronary artery bypass graft(s), unspecified, with unspecified angina pectoris: Secondary | ICD-10-CM

## 2016-10-06 DIAGNOSIS — F1910 Other psychoactive substance abuse, uncomplicated: Secondary | ICD-10-CM

## 2016-10-06 DIAGNOSIS — Z8659 Personal history of other mental and behavioral disorders: Secondary | ICD-10-CM | POA: Diagnosis not present

## 2016-10-06 DIAGNOSIS — F17210 Nicotine dependence, cigarettes, uncomplicated: Secondary | ICD-10-CM

## 2016-10-06 NOTE — Progress Notes (Signed)
Psychiatric Initial Adult Assessment   Patient Identification: Rodney Friedman MRN:  [id number] Date of Evaluation:  10/06/2016 Referral Source: Dr. Nyoka Cowden Chief Complaint: Need follow-up care  Chief Complaint    Follow-up     Visit Diagnosis: Bipolar disorder, substance use disorder No diagnosis found.  History of Present Illness: Today the patient is doing fairly well. No new changes. To clarify the patient has a daughter who essentially is homeless who is a crack addicted and lives somewhere environment. He is little contact with her. She's had 3 children. She is a 69 year old month baby and a 69 year old son. Both of these 2 children live under the care of family in [place] the patient is actually close with. He's known adopting family  for decades. therefore he therefore the family before the these 2 children. The mother has a neurological problem. Nonetheless she still functions and cares for both these 2 children as well as rongeur. His daughter also has another child visit is 69 years old who presently is living with his file. Since his father is drug addicted and her many other daughter in a house who also are using. The patient is very anxious for Vinc[place].he believes the chest matter time before he starts losing. He is only 71 years old. The patient has a wish that this child together and his other 48 year old son will come to visit her few weeks this summer. The patient also has a son who is well who is very dysfunctional. He's got a college degree lives with a older woman. He has small jobs and does much living. The patient is given him a car. The fact the patient overextended cell regular basis by sending money to [place] to take care of his grandchildren. The patient denies problems with sleep or appetite. His mood is actually pretty good. The patient goes to [place] number of times during the week and also NA meetings a number of   times a month.  The patient does have a sponsor. Patient uses  no alcohol or drugs. Shows no evidence of psychosis his issues are around spiritual factors and a car. His issues are also related to how much he cares for grandchildren what is an appropriate level of all. The patient enjoys himself in other ways. He does a lot of puzzles he draws cartoons he does yard work. The patient is not suicidal dysfunction actually very well.   Past Psychiatric History: Other psychiatrist care for over 20 years taking mainly Lamictal and Wellbutrin  Previous Psychotropic Medications: Lamictal 300 mg Wellbutrin 150 Substance Abuse History in the last 12 months:  Yes.    Consequences of Substance Abuse: NA  Past Medical History:  Past Medical History:  Diagnosis Date  . Anxiety   . Cataract   . 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 Depression   . Diabetes mellitus without complication ([place])   . Eye abnormality    Macular scarring R eye  . Glaucoma   . Heart murmur   . Hypertension   . Leukemia ([place])    CLL  . Myocardial infarction ([place])   . Prostate cancer ([place])   . Prostate cancer ([place]) 07/08/2013  . Substance abuse   . 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 COLONOSCOPY    . 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 EYE SURGERY      Family Psychiatric History:   Family History:  Family History  Problem Relation Age  of Onset  . Cancer Mother        colon cancer  . Cancer Father        prostate ca  . Hypertension Father     Social History:   Social History   Social History  . Marital status: Divorced    Spouse name: N/A  . Number of children: 2  . Years of education: 16   Occupational History  . Retired    Social History Main Topics  . Smoking status: Current Every Day Smoker    Packs/day: 0.75    Years: 50.00    Types: Cigarettes  . Smokeless tobacco: Never Used  . Alcohol use No  . Drug use: No  . Sexual activity: Not Currently   Other Topics Concern  . None    Social History Narrative   Lives at home w/ his father   Divorced   Right-handed   Education: College   No caffeine    Additional Social History:   Allergies:   Allergies  Allergen Reactions  . Other Rash    Allergen: "Plants and bushes while doing yard work"    Metabolic Disorder Labs: Lab Results  Component Value Date   HGBA1C 5.7 (H) 02/11/2016   MPG 117 02/11/2016   MPG 128 (H) 03/22/2015   No results found for: PROLACTIN Lab Results  Component Value Date   CHOL 174 03/10/2016   TRIG 207 (H) 03/10/2016   HDL 30 (L) 03/10/2016   CHOLHDL 5.8 (H) 03/10/2016   VLDL 41 (H) 03/10/2016   LDLCALC 103 03/10/2016   LDLCALC 104 02/11/2016     Current Medications: Current Outpatient Prescriptions  Medication Sig Dispense Refill  . amLODipine (NORVASC) 10 MG tablet Take 1 tablet (10 mg total) by mouth daily. 90 tablet 1  . B Complex Vitamins (VITAMIN-B COMPLEX PO) Take by mouth.    Marland Kitchen BIOTIN PO Take by mouth.    Marland Kitchen buPROPion (WELLBUTRIN XL) 150 MG 24 hr tablet 1  qam 90 tablet 2  . Calcium Carbonate-Vit D-Min (CALCIUM 1200 PO) Take 1,200 mg by mouth daily.    Marland Kitchen latanoprost (XALATAN) 0.005 % ophthalmic solution Once a day every third day    . LYCOPENE PO Take 8 mg by mouth daily.    . metFORMIN (GLUCOPHAGE) 500 MG tablet Take 1 tablet (500 mg total) by mouth every evening. 90 tablet 3  . metoprolol succinate (TOPROL-XL) 100 MG 24 hr tablet Take 1 tablet (100 mg total) by mouth daily. Take with or immediately following a meal. 90 tablet 1  . Omega-3 Fatty Acids (FISH OIL) 1000 MG CAPS Take 1,000 mg by mouth daily.    . polyethylene glycol (MIRALAX / GLYCOLAX) packet Take 17 g by mouth daily as needed for mild constipation.    . pravastatin (PRAVACHOL) 80 MG tablet Take 1 tablet (80 mg total) by mouth daily. 90 tablet 1  . valsartan (DIOVAN) 320 MG tablet TAKE 1 TABLET BY MOUTH EVERY DAY 30 tablet 0  . vitamin B-12 (CYANOCOBALAMIN) 500 MCG tablet Take 500 mcg by mouth  daily.    Marland Kitchen warfarin (COUMADIN) 4 MG tablet Take 4 mg by mouth daily.    Marland Kitchen warfarin (COUMADIN) 4 MG tablet TAKE 1 TO 1 & 1/2 TABLETS BY MOUTH EVERY DAY AS DIRECTED BY COUMADIN CLINIC 120 tablet 0  . vitamin C (ASCORBIC ACID) 500 MG tablet Take 500 mg by mouth daily.     No current facility-administered medications for this visit.  Neurologic: Headache: No Seizure: No Paresthesias:No  Musculoskeletal: Strength & Muscle Tone: within normal limits Gait & Station: normal Patient leans: Right  Psychiatric Specialty Exam: ROS  Blood pressure 110/62, pulse 61, weight 225 lb (102.1 kg), SpO2 93 %.Body mass index is 33.23 kg/m.  General Appearance: Casual  Eye Contact:  Good  Speech:  Clear and Coherent  Volume:  Normal  Mood:  NA  Affect:  Appropriate  Thought Process:  Goal Directed  Orientation:  NA  Thought Content:  WDL  Suicidal Thoughts:  No  Homicidal Thoughts:  No  Memory:  Negative  Judgement:  Good  Insight:  Good  Psychomotor Activity:  Normal  Concentration:    Recall:  [place] of Knowledge:Good  Language: Good  Akathisia:  No  Handed:  Right  AIMS (if indicated):    Assets:  Desire for Improvement  ADL's:  Intact  Cognition: WNL  Sleep:      Treatment Plan Summary: At this time I believe the patient's major problem is a substance use disorder. He's been sober for many years clean from any. I think he was misdiagnosed with bipolar disorder and over the last 6 months has come off Lamictal no problems. He only takes Wellbutrin at a small dose of 150 mg in the morning. Possibly one day discontinue this medicine. The biggest intervention will be to refer her to our therapist here. Patient return to see me in 3 months for a 30 minute visit.  Jerral Ralph, MD 5/18/20189:32 AM

## 2016-10-18 ENCOUNTER — Other Ambulatory Visit: Payer: Self-pay | Admitting: Family Medicine

## 2016-10-22 ENCOUNTER — Other Ambulatory Visit: Payer: Self-pay | Admitting: Family Medicine

## 2016-10-26 ENCOUNTER — Ambulatory Visit (INDEPENDENT_AMBULATORY_CARE_PROVIDER_SITE_OTHER): Payer: Medicare Other | Admitting: Family Medicine

## 2016-10-26 ENCOUNTER — Encounter: Payer: Self-pay | Admitting: Family Medicine

## 2016-10-26 VITALS — BP 144/73 | HR 66 | Temp 98.0°F | Resp 18 | Ht 70.08 in | Wt 223.0 lb

## 2016-10-26 DIAGNOSIS — E785 Hyperlipidemia, unspecified: Secondary | ICD-10-CM

## 2016-10-26 DIAGNOSIS — Z720 Tobacco use: Secondary | ICD-10-CM

## 2016-10-26 DIAGNOSIS — E119 Type 2 diabetes mellitus without complications: Secondary | ICD-10-CM

## 2016-10-26 DIAGNOSIS — I25709 Atherosclerosis of coronary artery bypass graft(s), unspecified, with unspecified angina pectoris: Secondary | ICD-10-CM

## 2016-10-26 DIAGNOSIS — F17210 Nicotine dependence, cigarettes, uncomplicated: Secondary | ICD-10-CM

## 2016-10-26 DIAGNOSIS — G4733 Obstructive sleep apnea (adult) (pediatric): Secondary | ICD-10-CM

## 2016-10-26 DIAGNOSIS — I10 Essential (primary) hypertension: Secondary | ICD-10-CM

## 2016-10-26 MED ORDER — VALSARTAN 320 MG PO TABS: ORAL_TABLET | ORAL | 1 refills | Status: DC

## 2016-10-26 MED ORDER — AMLODIPINE BESYLATE 10 MG PO TABS: 10.0000 mg | ORAL_TABLET | Freq: Every day | ORAL | 1 refills | Status: DC

## 2016-10-26 MED ORDER — PRAVASTATIN SODIUM 80 MG PO TABS: 80.0000 mg | ORAL_TABLET | Freq: Every day | ORAL | 1 refills | Status: DC

## 2016-10-26 MED ORDER — METFORMIN HCL 500 MG PO TABS: 500.0000 mg | ORAL_TABLET | Freq: Every evening | ORAL | 1 refills | Status: DC

## 2016-10-26 MED ORDER — METOPROLOL SUCCINATE ER 100 MG PO TB24: 100.0000 mg | ORAL_TABLET | Freq: Every day | ORAL | 1 refills | Status: DC

## 2016-10-26 NOTE — Patient Instructions (Addendum)
If cholesterol still elevated, would consider changing to Lipitor or Crestor.  Check blood pressures at home and if running over 140/90, let me know as we may need to change doses.  Continue metformin once per day for now. Walking most days per week for exercise. Discuss other types of exercise with your cardiologist.   Let me know when you are ready to quit smoking. See info below.   If you need a referral to gastroenterology for repeat colonoscopy, let me know.   Follow-up in 3 months  Steps to Quit Smoking Smoking tobacco can be harmful to your health and can affect almost every organ in your body. Smoking puts you, and those around you, at risk for developing many serious chronic diseases. Quitting smoking is difficult, but it is one of the best things that you can do for your health. It is never too late to quit. What are the benefits of quitting smoking? When you quit smoking, you lower your risk of developing serious diseases and conditions, such as:  Lung cancer or lung disease, such as COPD.  Heart disease.  Stroke.  Heart attack.  Infertility.  Osteoporosis and bone fractures.  Additionally, symptoms such as coughing, wheezing, and shortness of breath may get better when you quit. You may also find that you get sick less often because your body is stronger at fighting off colds and infections. If you are pregnant, quitting smoking can help to reduce your chances of having a baby of low birth weight. How do I get ready to quit? When you decide to quit smoking, create a plan to make sure that you are successful. Before you quit:  Pick a date to quit. Set a date within the next two weeks to give you time to prepare.  Write down the reasons why you are quitting. Keep this list in places where you will see it often, such as on your bathroom mirror or in your car or wallet.  Identify the people, places, things, and activities that make you want to smoke (triggers) and avoid  them. Make sure to take these actions: ? Throw away all cigarettes at home, at work, and in your car. ? Throw away smoking accessories, such as Scientist, research (medical). ? Clean your car and make sure to empty the ashtray. ? Clean your home, including curtains and carpets.  Tell your family, friends, and coworkers that you are quitting. Support from your loved ones can make quitting easier.  Talk with your health care provider about your options for quitting smoking.  Find out what treatment options are covered by your health insurance.  What strategies can I use to quit smoking? Talk with your healthcare provider about different strategies to quit smoking. Some strategies include:  Quitting smoking altogether instead of gradually lessening how much you smoke over a period of time. Research shows that quitting "cold [place]" is more successful than gradually quitting.  Attending in-person counseling to help you build problem-solving skills. You are more likely to have success in quitting if you attend several counseling sessions. Even short sessions of 10 minutes can be effective.  Finding resources and support systems that can help you to quit smoking and remain smoke-free after you quit. These resources are most helpful when you use them often. They can include: ? Online chats with a Social worker. ? Telephone quitlines. ? Careers information officer. ? Support groups or group counseling. ? Text messaging programs. ? Mobile phone applications.  Taking medicines to help you  quit smoking. (If you are pregnant or breastfeeding, talk with your health care provider first.) Some medicines contain nicotine and some do not. Both types of medicines help with cravings, but the medicines that include nicotine help to relieve withdrawal symptoms. Your health care provider may recommend: ? Nicotine patches, gum, or lozenges. ? Nicotine inhalers or sprays. ? Non-nicotine medicine that is taken by  mouth.  Talk with your health care provider about combining strategies, such as taking medicines while you are also receiving in-person counseling. Using these two strategies together makes you more likely to succeed in quitting than if you used either strategy on its own. If you are pregnant or breastfeeding, talk with your health care provider about finding counseling or other support strategies to quit smoking. Do not take medicine to help you quit smoking unless told to do so by your health care provider. What things can I do to make it easier to quit? Quitting smoking might feel overwhelming at first, but there is a lot that you can do to make it easier. Take these important actions:  Reach out to your family and friends and ask that they support and encourage you during this time. Call telephone quitlines, reach out to support groups, or work with a counselor for support.  Ask people who smoke to avoid smoking around you.  Avoid places that trigger you to smoke, such as bars, parties, or smoke-break areas at work.  Spend time around people who do not smoke.  Lessen stress in your life, because stress can be a smoking trigger for some people. To lessen stress, try: ? Exercising regularly. ? Deep-breathing exercises. ? Yoga. ? Meditating. ? Performing a body scan. This involves closing your eyes, scanning your body from head to toe, and noticing which parts of your body are particularly tense. Purposefully relax the muscles in those areas.  Download or purchase mobile phone or tablet apps (applications) that can help you stick to your quit plan by providing reminders, tips, and encouragement. There are many free apps, such as QuitGuide from the [employer] Office manager for Disease Control and Prevention). You can find other support for quitting smoking (smoking cessation) through smokefree.gov and other websites.  How will I feel when I quit smoking? Within the first 24 hours of quitting smoking,  you may start to feel some withdrawal symptoms. These symptoms are usually most noticeable 2-3 days after quitting, but they usually do not last beyond 2-3 weeks. Changes or symptoms that you might experience include:  Mood swings.  Restlessness, anxiety, or irritation.  Difficulty concentrating.  Dizziness.  Strong cravings for sugary foods in addition to nicotine.  Mild weight gain.  Constipation.  Nausea.  Coughing or a sore throat.  Changes in how your medicines work in your body.  A depressed mood.  Difficulty sleeping (insomnia).  After the first 2-3 weeks of quitting, you may start to notice more positive results, such as:  Improved sense of smell and taste.  Decreased coughing and sore throat.  Slower heart rate.  Lower blood pressure.  Clearer skin.  The ability to breathe more easily.  Fewer sick days.  Quitting smoking is very challenging for most people. Do not get discouraged if you are not successful the first time. Some people need to make many attempts to quit before they achieve long-term success. Do your best to stick to your quit plan, and talk with your health care provider if you have any questions or concerns. This information is not  intended to replace advice given to you by your health care provider. Make sure you discuss any questions you have with your health care provider. Document Released: 05/02/2001 Document Revised: 01/04/2016 Document Reviewed: 09/22/2014 Elsevier Interactive Patient Education  2017 [employer].     IF you received an x-ray today, you will receive an invoice from [facility]. Please contact [facility] at [phone] with questions or concerns regarding your invoice.   IF you received labwork today, you will receive an invoice from [facility]. Please contact [facility] at [phone] with questions or concerns regarding your invoice.   Our billing staff will not be able to assist you with  questions regarding bills from these companies.  You will be contacted with the lab results as soon as they are available. The fastest way to get your results is to activate your My Chart account. Instructions are located on the last page of this paperwork. If you have not heard from [place] regarding the results in 2 weeks, please contact this office.

## 2016-10-26 NOTE — Progress Notes (Addendum)
Subjective:  This chart was scribed for Rodney Agreste, MD by [name], at [place] [facility].  This patient was seen in room 26 and the patient's care was started at 11:00 AM.   Chief Complaint  Patient presents with  . Hypertension    follow-up  . Diabetes    recheck A1C     Patient ID: Rodney Friedman, male    DOB: 11/19/1945, 71 y.o.   MRN: [id number]  HPI HPI Comments: Rodney Friedman is a 71 y.o. male who presents to Primary Care at [facility] for a follow up.  Patient has a history of multiple medical problems including: atrial fibrillation, depression, bipolar disorder and history of substance use disorder.  Patient denies any side effects with his medications.  He does have to stop working every 15 minutes when he is in the yard to rest and catch his breath, but thinks this is from weight of abdomen and back soreness.   Depression: substance use disorder, possible history of bipolar disorder.  Most recent visit with Dr. Lenice Pressman May 18th.  He tolerated coming off of Lamictal. He has been sober for many years.  Take Wellbutrin 150 mg. ---- Patient states that his bipolar diagnosis was eliminated and he has done well coming off Lamictal.  He has done the AA 12 step program and will start CBT- which he is looking forward to. Patient states that he does not have many friends and doesn't have anyone to talk to.  His daughter is currently on the streets and is an addict.  He is currently smoking 12 cigarettes per day.  He has tried quitting in the past and states that he is not currently ready to do so. Patient has nicotine pills and states that he smokes less when he takes these.    Cardiac:  Atrial fibrillation: Followed by Cardiology Dr Sallyanne Kuster.  Office visit in December 2017.  Also has a history of CAD: status post CABG- 2006 in [place]. He takes coumadin and is monitored by their coumadin clinic.    Diabetes: Diet control, Patient is on Metformin 500 mg QD.  He is on  a statin. He has cut down sugar and is trying to lose weight by lifting and working in the yard.  Patient eats ice cream frequently and attempted to stop eating it for a couple weeks which only resulted in him losing 1 pound. Lab Results  Component Value Date   HGBA1C 5.7 (H) 02/11/2016   Wt Readings from Last 3 Encounters:  10/26/16 223 lb (101.2 kg)  04/21/16 219 lb (99.3 kg)  02/11/16 217 lb 6.4 oz (98.6 kg)  Last eye visit: February 28th., for Glaucoma. : He is complaint with going to his eye doctor and denies any issues with his retina.    Obstructive sleep apnea: With some day time fatigue noted at cardiology visit. Apparently did not tolerate C-PAP so he has not restarted, but plan for discussion with dentist for dental device. --- Patient has not used the CPAP machine as he "twists and turns" while he sleeps.  Patient has never talked to his dentist regarding the dental device.    Hyperlipidemia:Goal LDL less than 70 but he is on max dose of Pravastatin. Considered Lipitor or Crestor but weight loss discussed with cardiology.---- Patient has not had anything to eat or drink today. He is complaint with his medications.    Lab Results  Component Value Date   CHOL 174 03/10/2016   HDL 30 (  L) 03/10/2016   LDLCALC 103 03/10/2016   TRIG 207 (H) 03/10/2016   CHOLHDL 5.8 (H) 03/10/2016   Lab Results  Component Value Date   ALT 28 03/10/2016   AST 22 03/10/2016   ALKPHOS 38 (L) 03/10/2016   BILITOT 0.3 03/10/2016    History of CLL/prostate cancer: He is followed by urology and Dr. Alvy Bimler with hematology.--- Patient will be seeing Dr. Alvy Bimler in three months.    Hypertension: Takes Norvasc 10 mg QD, Toprol 100 mg QD. Valsartan 320 mg QD. --- Patient does not check his blood pressures at home.   Colonoscopy: Patient had 3 polyps when he had a colonoscopy about 5 years ago.  He is unsure if he is due for one again.    Patient Active Problem List   Diagnosis Date Noted  .  Paroxysmal atrial fibrillation ([place]) 04/21/2016  . Diabetes mellitus ([place]) 04/21/2016  . OSA (obstructive sleep apnea) 04/21/2016  . Quality of life palliative care encounter 01/03/2016  . Bilateral carotid bruits without stenosis 01/02/2016  . Tremor, essential 09/22/2015  . Glaucoma, open angle 06/16/2015  . 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  . Pseudoaphakia 06/15/2015  . CD (contact dermatitis) 04/29/2014  . 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 Hematuria 01/05/2014  . Skin lesion 01/05/2014  . Mild aortic valve stenosis 01/03/2014  . Hypertensive heart disease 01/03/2014  . Hyperlipidemia 01/03/2014  . Mild obesity 01/03/2014  . Tobacco abuse 01/03/2014  . Chronic atrial fibrillation ([place]) 12/31/2013  . Long term current use of anticoagulant therapy 12/31/2013  . Cataract, nuclear 10/06/2013  . Dermatochalasis of eyelid 10/06/2013  . 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 CAD (coronary artery disease) 09/03/2013  . Pre-diabetes 09/03/2013  . CLL (chronic lymphocytic leukemia) ([place]) 07/08/2013  . Prostate cancer ([place]) 07/08/2013   Past Medical History:  Diagnosis Date  . Anxiety   . Cataract   . 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 Depression   . Diabetes mellitus without complication ([place])   . Eye abnormality    Macular scarring R eye  . Glaucoma   . Heart murmur   . Hypertension   . Leukemia ([place])    CLL  . Myocardial infarction ([place])   . Prostate cancer ([place])   . Prostate cancer ([place]) 07/08/2013  . Substance abuse   . 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 COLONOSCOPY    . 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 EYE SURGERY     Allergies  Allergen Reactions  . Other Rash    Allergen: "Plants and bushes while doing yard work"   Prior to Admission medications   Medication Sig Start Date End Date Taking? Authorizing Provider  amLODipine (NORVASC) 10 MG tablet TAKE 1 TABLET  EVERY DAY 10/23/16   Rodney Agreste, MD  B Complex Vitamins (VITAMIN-B COMPLEX PO) Take by mouth.    [provider]  BIOTIN PO Take by mouth.    [provider]  buPROPion (WELLBUTRIN XL) 150 MG 24 hr tablet 1  qam 07/07/16   Plovsky, Berneta Sages, MD  Calcium Carbonate-Vit D-Min (CALCIUM 1200 PO) Take 1,200 mg by mouth daily.    [provider]  latanoprost (XALATAN) 0.005 % ophthalmic solution Once a day every third day 06/30/13   [provider]  LYCOPENE PO Take 8 mg by mouth daily.    [provider]  metFORMIN (GLUCOPHAGE) 500 MG tablet Take 1 tablet (500 mg total) by  mouth every evening. 04/21/16   Croitoru, Mihai, MD  metoprolol succinate (TOPROL-XL) 100 MG 24 hr tablet Take 1 tablet (100 mg total) by mouth daily. Take with or immediately following a meal. 02/11/16   Rodney Agreste, MD  Omega-3 Fatty Acids (FISH OIL) 1000 MG CAPS Take 1,000 mg by mouth daily.    [provider]  polyethylene glycol (MIRALAX / GLYCOLAX) packet Take 17 g by mouth daily as needed for mild constipation.    [provider]  pravastatin (PRAVACHOL) 80 MG tablet Take 1 tablet (80 mg total) by mouth daily. 02/11/16   Rodney Agreste, MD  valsartan (DIOVAN) 320 MG tablet TAKE 1 TABLET BY MOUTH EVERY DAY PT DUE FOR OFFICE VISIT 10/19/16   Rodney Agreste, MD  vitamin B-12 (CYANOCOBALAMIN) 500 MCG tablet Take 500 mcg by mouth daily.    [provider]  vitamin C (ASCORBIC ACID) 500 MG tablet Take 500 mg by mouth daily.    [provider]  warfarin (COUMADIN) 4 MG tablet Take 4 mg by mouth daily.    [provider]  warfarin (COUMADIN) 4 MG tablet TAKE 1 TO 1 & 1/2 TABLETS BY MOUTH EVERY DAY AS DIRECTED BY COUMADIN CLINIC 07/21/16   Croitoru, Dani Gobble, MD   Social History   Social History  . Marital status: Divorced    Spouse name: N/A  . Number of children: 2  . Years of education: 16   Occupational History  . Retired    Social  History Main Topics  . Smoking status: Current Every Day Smoker    Packs/day: 0.75    Years: 50.00    Types: Cigarettes  . Smokeless tobacco: Never Used  . Alcohol use No  . Drug use: No  . Sexual activity: Not Currently   Other Topics Concern  . Not on file   Social History Narrative   Lives at home w/ his father   Divorced   Right-handed   Education: College   No caffeine      Review of Systems  Constitutional: Negative for chills, fatigue, fever and unexpected weight change.  Eyes: Negative for pain, redness and visual disturbance.  Respiratory: Negative for cough, choking, chest tightness and shortness of breath.   Cardiovascular: Negative for chest pain, palpitations and leg swelling.  Gastrointestinal: Negative for abdominal pain, blood in stool, nausea and vomiting.  Neurological: Negative for dizziness, speech difficulty, light-headedness and headaches.       Objective:   Physical Exam  Constitutional: He is oriented to person, place, and time. He appears well-developed and well-nourished.  HENT:  Head: Normocephalic and atraumatic.  Eyes: EOM are normal. Pupils are equal, round, and reactive to light.  Neck: No JVD present. Carotid bruit is not present.  Cardiovascular: Normal rate, regular rhythm and normal heart sounds.   No murmur heard. Pulmonary/Chest: Effort normal and breath sounds normal. He has no rales.  Musculoskeletal: He exhibits no edema.  Neurological: He is alert and oriented to person, place, and time.  Skin: Skin is warm and dry.  Psychiatric: He has a normal mood and affect.  Vitals reviewed.  Vitals:   10/26/16 1022  BP: (!) 144/73  Pulse: 66  Resp: 18  Temp: 98 F (36.7 C)  TempSrc: Oral  SpO2: 98%  Weight: 223 lb (101.2 kg)  Height: 5' 10.08" (1.78 m)       Assessment & Plan:    Isami Mehra is a 71 y.o. male Type 2 diabetes  mellitus without complication, without long-term current use of insulin ([place]) - Plan:  Hemoglobin A1c, metFORMIN (GLUCOPHAGE) 500 MG tablet, Care order/instruction:  - tolerating metformin 500mg  QD. Continue to work on diet, mild to moderate intensity exercise with walking was initially discussed with any further advances in exercise to be discussed with cardiologist.  Essential hypertension - Plan: amLODipine (NORVASC) 10 MG tablet, metoprolol succinate (TOPROL-XL) 100 MG 24 hr tablet, valsartan (DIOVAN) 320 MG tablet  -Borderline elevated in office. No change in medications for now, continue work on diet, low to moderate intensity exercise as above. Recheck in 3 months  Hyperlipidemia, unspecified hyperlipidemia type - Plan: Lipid panel, Comprehensive metabolic panel, pravastatin (PRAVACHOL) 80 MG tablet  -Tolerating pravastatin 80 mg daily, but with history of diabetes and previous cholesterol level, anticipate possible change to higher intensity statin such as Lipitor or Crestor. Lipid panel pending, then can decide on changes.  Tobacco abuse  -Cessation discussed, not ready to quit at this time. I suspect with counseling and treatment of anxiety and depression symptoms, it will be easier for him to quit smoking. Resources provided  OSA (obstructive sleep apnea)  -Did not tolerate CPAP. Advised to at least follow up with dentist to see if dental device may be helpful. Possible cardiac and hypertension impact of untreated OSA was discussed  Addendum. See labs. Decision made to change to Crestor 20 mg daily, stop pravastatin. Recheck LFTs in the next 6 weeks.  Meds ordered this encounter  Medications  . amLODipine (NORVASC) 10 MG tablet    Sig: Take 1 tablet (10 mg total) by mouth daily.    Dispense:  90 tablet    Refill:  1    Needs office visit for refills  . metFORMIN (GLUCOPHAGE) 500 MG tablet    Sig: Take 1 tablet (500 mg total) by mouth every evening.    Dispense:  90 tablet    Refill:  1  . metoprolol succinate (TOPROL-XL) 100 MG 24 hr tablet    Sig: Take 1 tablet  (100 mg total) by mouth daily. Take with or immediately following a meal.    Dispense:  90 tablet    Refill:  1  . pravastatin (PRAVACHOL) 80 MG tablet    Sig: Take 1 tablet (80 mg total) by mouth daily.    Dispense:  90 tablet    Refill:  1  . valsartan (DIOVAN) 320 MG tablet    Sig: 1 po qd    Dispense:  90 tablet    Refill:  1   Patient Instructions   If cholesterol still elevated, would consider changing to Lipitor or Crestor.  Check blood pressures at home and if running over 140/90, let me know as we may need to change doses.  Continue metformin once per day for now. Walking most days per week for exercise. Discuss other types of exercise with your cardiologist.   Let me know when you are ready to quit smoking. See info below.   If you need a referral to gastroenterology for repeat colonoscopy, let me know.   Follow-up in 3 months  Steps to Quit Smoking Smoking tobacco can be harmful to your health and can affect almost every organ in your body. Smoking puts you, and those around you, at risk for developing many serious chronic diseases. Quitting smoking is difficult, but it is one of the best things that you can do for your health. It is never too late to quit. What are the benefits of quitting  smoking? When you quit smoking, you lower your risk of developing serious diseases and conditions, such as:  Lung cancer or lung disease, such as COPD.  Heart disease.  Stroke.  Heart attack.  Infertility.  Osteoporosis and bone fractures.  Additionally, symptoms such as coughing, wheezing, and shortness of breath may get better when you quit. You may also find that you get sick less often because your body is stronger at fighting off colds and infections. If you are pregnant, quitting smoking can help to reduce your chances of having a baby of low birth weight. How do I get ready to quit? When you decide to quit smoking, create a plan to make sure that you are successful.  Before you quit:  Pick a date to quit. Set a date within the next two weeks to give you time to prepare.  Write down the reasons why you are quitting. Keep this list in places where you will see it often, such as on your bathroom mirror or in your car or wallet.  Identify the people, places, things, and activities that make you want to smoke (triggers) and avoid them. Make sure to take these actions: ? Throw away all cigarettes at home, at work, and in your car. ? Throw away smoking accessories, such as Scientist, research (medical). ? Clean your car and make sure to empty the ashtray. ? Clean your home, including curtains and carpets.  Tell your family, friends, and coworkers that you are quitting. Support from your loved ones can make quitting easier.  Talk with your health care provider about your options for quitting smoking.  Find out what treatment options are covered by your health insurance.  What strategies can I use to quit smoking? Talk with your healthcare provider about different strategies to quit smoking. Some strategies include:  Quitting smoking altogether instead of gradually lessening how much you smoke over a period of time. Research shows that quitting "cold [place]" is more successful than gradually quitting.  Attending in-person counseling to help you build problem-solving skills. You are more likely to have success in quitting if you attend several counseling sessions. Even short sessions of 10 minutes can be effective.  Finding resources and support systems that can help you to quit smoking and remain smoke-free after you quit. These resources are most helpful when you use them often. They can include: ? Online chats with a Social worker. ? Telephone quitlines. ? Careers information officer. ? Support groups or group counseling. ? Text messaging programs. ? Mobile phone applications.  Taking medicines to help you quit smoking. (If you are pregnant or breastfeeding, talk  with your health care provider first.) Some medicines contain nicotine and some do not. Both types of medicines help with cravings, but the medicines that include nicotine help to relieve withdrawal symptoms. Your health care provider may recommend: ? Nicotine patches, gum, or lozenges. ? Nicotine inhalers or sprays. ? Non-nicotine medicine that is taken by mouth.  Talk with your health care provider about combining strategies, such as taking medicines while you are also receiving in-person counseling. Using these two strategies together makes you more likely to succeed in quitting than if you used either strategy on its own. If you are pregnant or breastfeeding, talk with your health care provider about finding counseling or other support strategies to quit smoking. Do not take medicine to help you quit smoking unless told to do so by your health care provider. What things can I do to make it easier  to quit? Quitting smoking might feel overwhelming at first, but there is a lot that you can do to make it easier. Take these important actions:  Reach out to your family and friends and ask that they support and encourage you during this time. Call telephone quitlines, reach out to support groups, or work with a counselor for support.  Ask people who smoke to avoid smoking around you.  Avoid places that trigger you to smoke, such as bars, parties, or smoke-break areas at work.  Spend time around people who do not smoke.  Lessen stress in your life, because stress can be a smoking trigger for some people. To lessen stress, try: ? Exercising regularly. ? Deep-breathing exercises. ? Yoga. ? Meditating. ? Performing a body scan. This involves closing your eyes, scanning your body from head to toe, and noticing which parts of your body are particularly tense. Purposefully relax the muscles in those areas.  Download or purchase mobile phone or tablet apps (applications) that can help you stick to your  quit plan by providing reminders, tips, and encouragement. There are many free apps, such as QuitGuide from the [employer] Office manager for Disease Control and Prevention). You can find other support for quitting smoking (smoking cessation) through smokefree.gov and other websites.  How will I feel when I quit smoking? Within the first 24 hours of quitting smoking, you may start to feel some withdrawal symptoms. These symptoms are usually most noticeable 2-3 days after quitting, but they usually do not last beyond 2-3 weeks. Changes or symptoms that you might experience include:  Mood swings.  Restlessness, anxiety, or irritation.  Difficulty concentrating.  Dizziness.  Strong cravings for sugary foods in addition to nicotine.  Mild weight gain.  Constipation.  Nausea.  Coughing or a sore throat.  Changes in how your medicines work in your body.  A depressed mood.  Difficulty sleeping (insomnia).  After the first 2-3 weeks of quitting, you may start to notice more positive results, such as:  Improved sense of smell and taste.  Decreased coughing and sore throat.  Slower heart rate.  Lower blood pressure.  Clearer skin.  The ability to breathe more easily.  Fewer sick days.  Quitting smoking is very challenging for most people. Do not get discouraged if you are not successful the first time. Some people need to make many attempts to quit before they achieve long-term success. Do your best to stick to your quit plan, and talk with your health care provider if you have any questions or concerns. This information is not intended to replace advice given to you by your health care provider. Make sure you discuss any questions you have with your health care provider. Document Released: 05/02/2001 Document Revised: 01/04/2016 Document Reviewed: 09/22/2014 Elsevier Interactive Patient Education  2017 [employer].     IF you received an x-ray today, you will receive an invoice from  [facility]. Please contact [facility] at [phone] with questions or concerns regarding your invoice.   IF you received labwork today, you will receive an invoice from [facility]. Please contact [facility] at [phone] with questions or concerns regarding your invoice.   Our billing staff will not be able to assist you with questions regarding bills from these companies.  You will be contacted with the lab results as soon as they are available. The fastest way to get your results is to activate your My Chart account. Instructions are located on the last page of this paperwork. If you  have not heard from [place] regarding the results in 2 weeks, please contact this office.       I personally performed the services described in this documentation, which was scribed in my presence. The recorded information has been reviewed and considered for accuracy and completeness, addended by me as needed, and agree with information above.  Signed,   [name], MD [facility] at [facility].  10/26/16 1:44 PM

## 2016-10-27 LAB — COMPREHENSIVE METABOLIC PANEL
ALK PHOS: 41 IU/L (ref 39–117)
ALT: 23 IU/L (ref 0–44)
AST: 20 IU/L (ref 0–40)
Albumin/Globulin Ratio: 1.8 (ref 1.2–2.2)
Albumin: 4.3 g/dL (ref 3.6–4.8)
BUN / CREAT RATIO: 19 (ref 10–24)
BUN: 16 mg/dL (ref 8–27)
Bilirubin Total: 0.4 mg/dL (ref 0.0–1.2)
CHLORIDE: 105 mmol/L (ref 96–106)
CO2: 21 mmol/L (ref 18–29)
Calcium: 9.9 mg/dL (ref 8.6–10.2)
Creatinine, Ser: 0.86 mg/dL (ref 0.76–1.27)
GFR calc Af Amer: 103 mL/min/{1.73_m2} (ref >59)
GFR, EST NON AFRICAN AMERICAN: 89 mL/min/{1.73_m2} (ref >59)
GLUCOSE: 110 mg/dL — AB (ref 65–99)
Globulin, Total: 2.4 g/dL (ref 1.5–4.5)
POTASSIUM: 4.5 mmol/L (ref 3.5–5.2)
Sodium: 142 mmol/L (ref 134–144)
Total Protein: 6.7 g/dL (ref 6.0–8.5)

## 2016-10-27 LAB — LIPID PANEL
Chol/HDL Ratio: 6 ratio — ABNORMAL HIGH (ref 0.0–5.0)
Cholesterol, Total: 186 mg/dL (ref 100–199)
HDL: 31 mg/dL — ABNORMAL LOW (ref >39)
LDL CALC: 103 mg/dL — AB (ref 0–99)
Triglycerides: 262 mg/dL — ABNORMAL HIGH (ref 0–149)
VLDL CHOLESTEROL CAL: 52 mg/dL — AB (ref 5–40)

## 2016-10-27 LAB — HEMOGLOBIN A1C
Est. average glucose Bld gHb Est-mCnc: 134 mg/dL
Hgb A1c MFr Bld: 6.3 % — ABNORMAL HIGH (ref 4.8–5.6)

## 2016-10-31 ENCOUNTER — Ambulatory Visit (INDEPENDENT_AMBULATORY_CARE_PROVIDER_SITE_OTHER): Payer: Medicare Other | Admitting: Licensed Clinical Social Worker

## 2016-10-31 ENCOUNTER — Encounter (HOSPITAL_COMMUNITY): Payer: Self-pay | Admitting: Licensed Clinical Social Worker

## 2016-10-31 DIAGNOSIS — F3130 Bipolar disorder, current episode depressed, mild or moderate severity, unspecified: Secondary | ICD-10-CM | POA: Diagnosis not present
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
## 2016-11-03 ENCOUNTER — Ambulatory Visit (INDEPENDENT_AMBULATORY_CARE_PROVIDER_SITE_OTHER): Payer: Medicare Other | Admitting: Pharmacist Clinician (PhC)/ Clinical Pharmacy Specialist

## 2016-11-03 DIAGNOSIS — I25709 Atherosclerosis of coronary artery bypass graft(s), unspecified, with unspecified angina pectoris: Secondary | ICD-10-CM

## 2016-11-03 DIAGNOSIS — I482 Chronic atrial fibrillation, unspecified: Secondary | ICD-10-CM

## 2016-11-03 DIAGNOSIS — I480 Paroxysmal atrial fibrillation: Secondary | ICD-10-CM

## 2016-11-03 DIAGNOSIS — Z7901 Long term (current) use of anticoagulants: Secondary | ICD-10-CM | POA: Diagnosis not present

## 2016-11-03 LAB — POCT INR: INR: 3.5

## 2016-11-06 ENCOUNTER — Encounter: Payer: Self-pay | Admitting: Family Medicine

## 2016-11-08 ENCOUNTER — Encounter: Payer: Self-pay | Admitting: Family Medicine

## 2016-11-08 MED ORDER — ROSUVASTATIN CALCIUM 20 MG PO TABS: 20.0000 mg | ORAL_TABLET | Freq: Every day | ORAL | 1 refills | Status: DC

## 2016-11-08 NOTE — Addendum Note (Signed)
Addended by: [name] R on: 11/08/2016 02:13 PM   Modules accepted: Orders

## 2016-11-15 ENCOUNTER — Encounter (HOSPITAL_COMMUNITY): Payer: Self-pay | Admitting: Licensed Clinical Social Worker

## 2016-11-15 ENCOUNTER — Ambulatory Visit (INDEPENDENT_AMBULATORY_CARE_PROVIDER_SITE_OTHER): Payer: Medicare Other | Admitting: Licensed Clinical Social Worker

## 2016-11-15 DIAGNOSIS — Z8659 Personal history of other mental and behavioral disorders: Secondary | ICD-10-CM | POA: Diagnosis not present

## 2016-11-15 NOTE — Progress Notes (Signed)
   THERAPIST PROGRESS NOTE  Session Time: 9:10-10am  Participation Level: Active  Behavioral Response: CasualAlertEuthymic  Type of Therapy: Individual Therapy  Treatment Goals addressed: Coping  Interventions: CBT  Summary: Rodney Friedman is a 71 y.o. male.Today is pt's first day of therapy. Spent a considerable amount of time building a trusting therapeutic relationship and gaining background information on the pt. Spent the remainder of the session assisting the pt identifying goals for treatment plan. Pt and his 75 yo father live in his house. His 67 yo son lives in [place] and is a "pot head" and works odd jobs, lives with a woman 77 years older. "He is a good kid." He also has a daughter who lives in [place] who is a "crack whore." He supports all of his grandchildren because his children can't. Pt is a retired Education officer, museum with union retirement benefits. Pt is in recovery and goes to 4 meetings per week and has a sponsor and works the steps. Pt has previously been to therapy and has a desire to work on issues.  Suicidal/Homicidal: Nowithout intent/plan  Therapist Response: Assessed pt's current functioning and reviewed progress. Assisted pt in treatment planning and working on a trusting therapeutic relationship.  Plan: Return again in 2 weeks.  Diagnosis: Axis I: HX of major depression. Bipolar 1 Disorder  Most recent episode depressed        MACKENZIE,LISBETH S, LCAS 11/15/2016

## 2016-11-27 ENCOUNTER — Encounter (HOSPITAL_COMMUNITY): Payer: Self-pay | Admitting: Licensed Clinical Social Worker

## 2016-11-29 ENCOUNTER — Encounter (HOSPITAL_COMMUNITY): Payer: Self-pay | Admitting: Licensed Clinical Social Worker

## 2016-11-29 ENCOUNTER — Ambulatory Visit (INDEPENDENT_AMBULATORY_CARE_PROVIDER_SITE_OTHER): Payer: Medicare Other | Admitting: Licensed Clinical Social Worker

## 2016-11-29 DIAGNOSIS — Z8659 Personal history of other mental and behavioral disorders: Secondary | ICD-10-CM | POA: Diagnosis not present

## 2016-11-29 DIAGNOSIS — L237 Allergic contact dermatitis due to plants, except food: Secondary | ICD-10-CM | POA: Diagnosis not present

## 2016-11-29 NOTE — Progress Notes (Signed)
   THERAPIST PROGRESS NOTE  Session Time: 9:10-10am  Participation Level: Active  Behavioral Response: CasualAlertEuthymic  Type of Therapy: Individual Therapy  Treatment Goals addressed: Coping  Interventions: CBT  Summary: Rodney Friedman is a 71 y.o. male who presents for his individual counseling session the patient. Discussed his psychiatric symptoms and current life events. Pt has been working in his yard and his eye was swollen. Pt was disappointed that he is unable to fly his grandchildren up from [place] due to airline guidelines. Pt revealed that he also has a gambling addiction. Because he now lives in [place] it is prohibitive to gamble because of the [place] tax laws. He treats himself to 1x of gambling per year. He has previously been to [facility] [employer]. Today pt wanted to talk about his daughter who is a drug addict and prostitute. Asked pt open ended questions and pt talked about his feelings about the life she chose. He was able to focus more on what he was able to provide for her. He was insightful in his thoughts. Discussed basic CBT concepts and th thought emotion connection and alternative perspectives. Pt is still going to his 12 step meetings weekly and working steps with his sponsor.      Suicidal/Homicidal: Nowithout intent/plan  Therapist Response: Assessed pt's current functioning and reviewed progress. Assisted pt processing addiction, 12 step groups, his daughter.  Plan: Return again in 2 weeks. Wants to move towards discussing his tulmultous relationship with his mother for 2 sessions.  Diagnosis: Axis I: HX of major depression. Bipolar 1 Disorder  Most recent episode depressed        MACKENZIE,LISBETH S, LCAS 11/29/2016

## 2016-12-12 DIAGNOSIS — H401122 Primary open-angle glaucoma, left eye, moderate stage: Secondary | ICD-10-CM | POA: Diagnosis not present

## 2016-12-13 ENCOUNTER — Ambulatory Visit (INDEPENDENT_AMBULATORY_CARE_PROVIDER_SITE_OTHER): Payer: Medicare Other | Admitting: Licensed Clinical Social Worker

## 2016-12-13 ENCOUNTER — Encounter (HOSPITAL_COMMUNITY): Payer: Self-pay | Admitting: Licensed Clinical Social Worker

## 2016-12-13 DIAGNOSIS — Z8659 Personal history of other mental and behavioral disorders: Secondary | ICD-10-CM | POA: Diagnosis not present

## 2016-12-13 NOTE — Progress Notes (Signed)
   THERAPIST PROGRESS NOTE  Session Time: 9:10-10am  Participation Level: Active  Behavioral Response: CasualAlertEuthymic  Type of Therapy: Individual Therapy  Treatment Goals addressed: Coping  Interventions: CBT  Summary: Rodney Friedman is a 71 y.o. male who presents for his individual counseling session the patient. Discussed his psychiatric symptoms and current life events.  Pt had requested to talk about his mother at this session. Pt describes his mother as Investment banker, corporate and austere. Validated pt that she dismissed his personhood as a young boy. Discussed basic CBT concepts with pt: thought emotion connection.He was raised in a [place] family so he started drinking at age 77, which probably led him to his alcoholism. Pt was open and honest in his processing of his childhood.  Suicidal/Homicidal: Nowithout intent/plan  Therapist Response: Assessed pt's current functioning and reviewed progress. Assisted pt processing mother, personhood, CBT, alcoholism.   Plan: Return again in 2 weeks. Wants to move towards discussing his tulmultous relationship with his mother for next session. Suggested to pt to journal any childhhood memories and feelings about his childhood and bring his journal to the next session.  Diagnosis: Axis I: HX of major depression. Bipolar 1 Disorder  Most recent episode depressed        Amelia Macken S, LCAS 12/13/2016

## 2016-12-15 ENCOUNTER — Ambulatory Visit (INDEPENDENT_AMBULATORY_CARE_PROVIDER_SITE_OTHER): Payer: Medicare Other | Admitting: Pharmacist Clinician (PhC)/ Clinical Pharmacy Specialist

## 2016-12-15 DIAGNOSIS — I482 Chronic atrial fibrillation, unspecified: Secondary | ICD-10-CM

## 2016-12-15 DIAGNOSIS — Z7901 Long term (current) use of anticoagulants: Secondary | ICD-10-CM

## 2016-12-15 DIAGNOSIS — I480 Paroxysmal atrial fibrillation: Secondary | ICD-10-CM

## 2016-12-15 DIAGNOSIS — I25709 Atherosclerosis of coronary artery bypass graft(s), unspecified, with unspecified angina pectoris: Secondary | ICD-10-CM | POA: Diagnosis not present

## 2016-12-15 LAB — POCT INR: INR: 3.3

## 2016-12-18 ENCOUNTER — Other Ambulatory Visit: Payer: Self-pay | Admitting: Family Medicine

## 2016-12-27 ENCOUNTER — Encounter (HOSPITAL_COMMUNITY): Payer: Self-pay | Admitting: Licensed Clinical Social Worker

## 2016-12-27 ENCOUNTER — Ambulatory Visit (INDEPENDENT_AMBULATORY_CARE_PROVIDER_SITE_OTHER): Payer: Medicare Other | Admitting: Licensed Clinical Social Worker

## 2016-12-27 DIAGNOSIS — F3130 Bipolar disorder, current episode depressed, mild or moderate severity, unspecified: Secondary | ICD-10-CM

## 2016-12-27 NOTE — Progress Notes (Signed)
   THERAPIST PROGRESS NOTE  Session Time: 9:10-10am  Participation Level: Active  Behavioral Response: CasualAlertEuthymic  Type of Therapy: Individual Therapy  Treatment Goals addressed: Coping  Interventions: CBT  Summary: Rodney Friedman is a 71 y.o. male who presents for his individual counseling session the patient. Discussed his psychiatric symptoms and current life events.  Pt had requested to talk about his mother at this session too. Pt has many sad memories of his mother and struggles to forgive her. Pt identified himself as a child as as Geophysicist/field seismologist and curious. She tried her best to quash that. She yelled at him for the most innocuous things. He is continuing to have flashbacks fo his childhood. Discussed 2 therapeutic methods with pt: empty chair and exposure therapy. Pt will think about it before next appointment.  Discussed basic CBT concepts with pt: thought emotion connection. Pt appeared open and honest in the processing of his childhood.  Suicidal/Homicidal: Nowithout intent/plan  Therapist Response: Assessed pt's current functioning and reviewed progress. Assisted pt processing mother, childhood, CBT.   Plan: Return again in 2 weeks. Wants to move towards discussing his tulmultous relationship with his mother for next session again. Also wants to discuss his fear of being more than 7 miles away from home.  Diagnosis: Axis I: HX of major depression. Bipolar 1 Disorder  Most recent episode depressed        [place], LCAS 12/27/2016

## 2016-12-29 ENCOUNTER — Other Ambulatory Visit: Payer: Self-pay | Admitting: Urgent Care

## 2017-01-01 ENCOUNTER — Telehealth: Payer: Self-pay

## 2017-01-01 ENCOUNTER — Encounter: Payer: Self-pay | Admitting: Hematology and Oncology

## 2017-01-01 ENCOUNTER — Ambulatory Visit (HOSPITAL_BASED_OUTPATIENT_CLINIC_OR_DEPARTMENT_OTHER): Payer: Medicare Other | Admitting: Hematology and Oncology

## 2017-01-01 ENCOUNTER — Other Ambulatory Visit (HOSPITAL_BASED_OUTPATIENT_CLINIC_OR_DEPARTMENT_OTHER): Payer: Medicare Other

## 2017-01-01 ENCOUNTER — Other Ambulatory Visit: Payer: Self-pay | Admitting: Urgent Care

## 2017-01-01 DIAGNOSIS — C9110 Chronic lymphocytic leukemia of B-cell type not having achieved remission: Secondary | ICD-10-CM

## 2017-01-01 DIAGNOSIS — I482 Chronic atrial fibrillation, unspecified: Secondary | ICD-10-CM

## 2017-01-01 DIAGNOSIS — C61 Malignant neoplasm of prostate: Secondary | ICD-10-CM

## 2017-01-01 DIAGNOSIS — Z720 Tobacco use: Secondary | ICD-10-CM

## 2017-01-01 DIAGNOSIS — Z7901 Long term (current) use of anticoagulants: Secondary | ICD-10-CM

## 2017-01-01 LAB — CBC WITH DIFFERENTIAL/PLATELET
BASO%: 0.5 % (ref 0.0–2.0)
Basophils Absolute: 0.1 10*3/uL (ref 0.0–0.1)
EOS%: 2.1 % (ref 0.0–7.0)
Eosinophils Absolute: 0.4 10*3/uL (ref 0.0–0.5)
HEMATOCRIT: 46.4 % (ref 38.4–49.9)
HEMOGLOBIN: 15.8 g/dL (ref 13.0–17.1)
LYMPH#: 9.7 10*3/uL — AB (ref 0.9–3.3)
LYMPH%: 56.1 % — ABNORMAL HIGH (ref 14.0–49.0)
MCH: 31.2 pg (ref 27.2–33.4)
MCHC: 34 g/dL (ref 32.0–36.0)
MCV: 91.8 fL (ref 79.3–98.0)
MONO#: 1.5 10*3/uL — AB (ref 0.1–0.9)
MONO%: 8.6 % (ref 0.0–14.0)
NEUT%: 32.7 % — ABNORMAL LOW (ref 39.0–75.0)
NEUTROS ABS: 5.7 10*3/uL (ref 1.5–6.5)
PLATELETS: 174 10*3/uL (ref 140–400)
RBC: 5.06 10*6/uL (ref 4.20–5.82)
RDW: 13.5 % (ref 11.0–14.6)
WBC: 17.3 10*3/uL — AB (ref 4.0–10.3)

## 2017-01-01 LAB — TECHNOLOGIST REVIEW

## 2017-01-01 MED ORDER — VALSARTAN 320 MG PO TABS: ORAL_TABLET | ORAL | 0 refills | Status: DC

## 2017-01-01 NOTE — Assessment & Plan Note (Signed)
I would defer treatment to his urologist. The patient had stopped taking Lupron and according to him, the last PSA was stable

## 2017-01-01 NOTE — Progress Notes (Signed)
[place] OFFICE PROGRESS NOTE  Patient Care Team: Wendie Agreste, MD as PCP - General (Family Medicine) Kathie Rhodes, MD as Consulting Physician (Urology) Croitoru, Dani Gobble, MD as Consulting Physician (Cardiology) Zebedee Iba., MD as Consulting Physician (Ophthalmology)  SUMMARY OF ONCOLOGIC HISTORY: Oncology History   Prostate cancer   Primary site: Prostate (Left)   Staging method: AJCC 7th Edition   Clinical: Stage IIB (T1c, N0, M0) signed by Heath Lark, MD on 07/09/2013 10:04 AM   Summary: Stage IIB (T1c, N0, M0)  CLL stage I. FISH normal     CLL (chronic lymphocytic leukemia) ([place])   07/08/2013 Pathology Results    Peripheral Blood Flow Cytometry - MONOCLONAL B-CELL POPULATION IDENTIFIED. Diagnosis Comment: The phenotypic features are consistent with chronic lymphocytic leukemia. Clinical correlation is recommended.       07/08/2013 Pathology Results    FISH panel is normal       Prostate cancer ([place])   07/12/2012 Tumor Marker    PSA was high at 9.6      09/30/2012 Procedure    He underwent prostate biopsy at confirm diagnosis of prostate cancer.      11/11/2012 - 01/09/2013 Radiation Therapy    The patient completed radiation therapy to his prostate using IM RT technique       INTERVAL HISTORY: Please see below for problem oriented charting. He returns for further follow-up No new lymphadenopathy Denies recent night sweats, anorexia or weight loss He is doing well on anticoagulation therapy The patient denies any recent signs or symptoms of bleeding such as spontaneous epistaxis, hematuria or hematochezia. He follows closely with urologist for prostate cancer.  He denies significant urinary symptoms  REVIEW OF SYSTEMS:   Constitutional: Denies fevers, chills or abnormal weight loss Eyes: Denies blurriness of vision Ears, nose, mouth, throat, and face: Denies mucositis or sore throat Respiratory: Denies cough, dyspnea or  wheezes Cardiovascular: Denies palpitation, chest discomfort or lower extremity swelling Gastrointestinal:  Denies nausea, heartburn or change in bowel habits Skin: Denies abnormal skin rashes Lymphatics: Denies new lymphadenopathy or easy bruising Neurological:Denies numbness, tingling or new weaknesses Behavioral/Psych: Mood is stable, no new changes  All other systems were reviewed with the patient and are negative.  I have reviewed the past medical history, past surgical history, social history and family history with the patient and they are unchanged from previous note.  ALLERGIES:  is allergic to other.  MEDICATIONS:  Current Outpatient Prescriptions  Medication Sig Dispense Refill  . amLODipine (NORVASC) 10 MG tablet Take 1 tablet (10 mg total) by mouth daily. 90 tablet 1  . B Complex Vitamins (VITAMIN-B COMPLEX PO) Take by mouth.    Marland Kitchen BIOTIN PO Take by mouth.    Marland Kitchen buPROPion (WELLBUTRIN XL) 150 MG 24 hr tablet 1  qam 90 tablet 2  . Calcium Carbonate-Vit D-Min (CALCIUM 1200 PO) Take 1,200 mg by mouth daily.    Marland Kitchen latanoprost (XALATAN) 0.005 % ophthalmic solution Once a day every second day    . LYCOPENE PO Take 8 mg by mouth daily.    . metFORMIN (GLUCOPHAGE) 500 MG tablet Take 1 tablet (500 mg total) by mouth every evening. 90 tablet 1  . metoprolol succinate (TOPROL-XL) 100 MG 24 hr tablet Take 1 tablet (100 mg total) by mouth daily. Take with or immediately following a meal. 90 tablet 1  . Omega-3 Fatty Acids (FISH OIL) 1000 MG CAPS Take 1,000 mg by mouth daily.    . polyethylene glycol (  MIRALAX / GLYCOLAX) packet Take 17 g by mouth daily as needed for mild constipation.    . rosuvastatin (CRESTOR) 20 MG tablet Take 1 tablet (20 mg total) by mouth daily. 90 tablet 1  . valsartan (DIOVAN) 320 MG tablet TAKE 1 TABLET BY MOUTH EVERY DAY*PT DUE FOR OFFICE VISIT 30 tablet 0  . vitamin B-12 (CYANOCOBALAMIN) 500 MCG tablet Take 500 mcg by mouth daily.    . vitamin C (ASCORBIC ACID)  500 MG tablet Take 500 mg by mouth daily.    Marland Kitchen warfarin (COUMADIN) 4 MG tablet TAKE 1 TO 1 & 1/2 TABLETS BY MOUTH EVERY DAY AS DIRECTED BY COUMADIN CLINIC 120 tablet 0   No current facility-administered medications for this visit.     PHYSICAL EXAMINATION: ECOG PERFORMANCE STATUS: 1 - Symptomatic but completely ambulatory  Vitals:   01/01/17 0808  BP: (!) 137/51  Pulse: 65  Resp: 20  Temp: 98 F (36.7 C)  SpO2: 98%   Filed Weights   01/01/17 0808  Weight: 227 lb 14.4 oz (103.4 kg)    GENERAL:alert, no distress and comfortable SKIN: skin color, texture, turgor are normal, no rashes or significant lesions EYES: normal, Conjunctiva are pink and non-injected, sclera clear OROPHARYNX:no exudate, no erythema and lips, buccal mucosa, and tongue normal  NECK: supple, thyroid normal size, non-tender, without nodularity LYMPH: He has small lymphadenopathy but not significant on exam LUNGS: clear to auscultation and percussion with normal breathing effort HEART: regular rate & rhythm and no murmurs and no lower extremity edema ABDOMEN:abdomen soft, non-tender and normal bowel sounds Musculoskeletal:no cyanosis of digits and no clubbing  NEURO: alert & oriented x 3 with fluent speech, no focal motor/sensory deficits  LABORATORY DATA:  I have reviewed the data as listed    Component Value Date/Time   NA 142 10/26/2016 1137   NA 141 01/04/2015 0808   K 4.5 10/26/2016 1137   K 4.3 01/04/2015 0808   CL 105 10/26/2016 1137   CO2 21 10/26/2016 1137   CO2 21 (L) 01/04/2015 0808   GLUCOSE 110 (H) 10/26/2016 1137   GLUCOSE 89 03/10/2016 0830   GLUCOSE 115 01/04/2015 0808   BUN 16 10/26/2016 1137   BUN 18.7 01/04/2015 0808   CREATININE 0.86 10/26/2016 1137   CREATININE 1.16 03/10/2016 0830   CREATININE 0.9 01/04/2015 0808   CALCIUM 9.9 10/26/2016 1137   CALCIUM 9.0 01/04/2015 0808   PROT 6.7 10/26/2016 1137   PROT 6.3 (L) 01/04/2015 0808   ALBUMIN 4.3 10/26/2016 1137   ALBUMIN  3.7 01/04/2015 0808   AST 20 10/26/2016 1137   AST 19 01/04/2015 0808   ALT 23 10/26/2016 1137   ALT 33 01/04/2015 0808   ALKPHOS 41 10/26/2016 1137   ALKPHOS 47 01/04/2015 0808   BILITOT 0.4 10/26/2016 1137   BILITOT 0.23 01/04/2015 0808   GFRNONAA 89 10/26/2016 1137   GFRNONAA 70 02/11/2016 1526   GFRAA 103 10/26/2016 1137   GFRAA 81 02/11/2016 1526    No results found for: SPEP, UPEP  Lab Results  Component Value Date   WBC 17.3 (H) 01/01/2017   NEUTROABS 5.7 01/01/2017   HGB 15.8 01/01/2017   HCT 46.4 01/01/2017   MCV 91.8 01/01/2017   PLT 174 01/01/2017      Chemistry      Component Value Date/Time   NA 142 10/26/2016 1137   NA 141 01/04/2015 0808   K 4.5 10/26/2016 1137   K 4.3 01/04/2015 0808   CL 105  10/26/2016 1137   CO2 21 10/26/2016 1137   CO2 21 (L) 01/04/2015 0808   BUN 16 10/26/2016 1137   BUN 18.7 01/04/2015 0808   CREATININE 0.86 10/26/2016 1137   CREATININE 1.16 03/10/2016 0830   CREATININE 0.9 01/04/2015 0808      Component Value Date/Time   CALCIUM 9.9 10/26/2016 1137   CALCIUM 9.0 01/04/2015 0808   ALKPHOS 41 10/26/2016 1137   ALKPHOS 47 01/04/2015 0808   AST 20 10/26/2016 1137   AST 19 01/04/2015 0808   ALT 23 10/26/2016 1137   ALT 33 01/04/2015 0808   BILITOT 0.4 10/26/2016 1137   BILITOT 0.23 01/04/2015 0808      ASSESSMENT & PLAN:  CLL (chronic lymphocytic leukemia) Clinically, his white blood cell count is stable. He has small palpable lymphadenopathy in the neck and axilla. I recommend observation only and see him back on a yearly basis with history, physical examination and blood work.   Prostate cancer I would defer treatment to his urologist. The patient had stopped taking Lupron and according to him, the last PSA was stable  Chronic atrial fibrillation [facility]) The patient is doing well on warfarin. The patient stated that he cannot afford the cost of the drug He is doing well without any bleeding  complications  Tobacco abuse I spent some time counseling the patient the importance of tobacco cessation. he is currently attempting to quit on his own   No orders of the defined types were placed in this encounter.  All questions were answered. The patient knows to call the clinic with any problems, questions or concerns. No barriers to learning was detected. I spent 15 minutes counseling the patient face to face. The total time spent in the appointment was 20 minutes and more than 50% was on counseling and review of test results     Heath Lark, MD 01/01/2017 9:47 AM

## 2017-01-01 NOTE — Assessment & Plan Note (Signed)
Clinically, his white blood cell count is stable. He has small palpable lymphadenopathy in the neck and axilla. I recommend observation only and see him back on a yearly basis with history, physical examination and blood work.

## 2017-01-01 NOTE — Assessment & Plan Note (Signed)
I spent some time counseling the patient the importance of tobacco cessation. he is currently attempting to quit on his own 

## 2017-01-01 NOTE — Telephone Encounter (Signed)
appts made and avs printed for patient 

## 2017-01-01 NOTE — Assessment & Plan Note (Signed)
The patient is doing well on warfarin. The patient stated that he cannot afford the cost of the drug He is doing well without any bleeding complications

## 2017-01-03 ENCOUNTER — Telehealth: Payer: Self-pay

## 2017-01-03 NOTE — Telephone Encounter (Signed)
Called pt to schedule Medicare Annual Wellness Visit with nurse health advisor. -nr  

## 2017-01-05 NOTE — Telephone Encounter (Signed)
Refill request for polyethylene glycol 3350 powder approved

## 2017-01-10 ENCOUNTER — Ambulatory Visit (HOSPITAL_COMMUNITY): Payer: Medicare Other | Admitting: Licensed Clinical Social Worker

## 2017-01-10 ENCOUNTER — Encounter (HOSPITAL_COMMUNITY): Payer: Self-pay | Admitting: Licensed Clinical Social Worker

## 2017-01-10 DIAGNOSIS — F3130 Bipolar disorder, current episode depressed, mild or moderate severity, unspecified: Secondary | ICD-10-CM | POA: Diagnosis not present

## 2017-01-10 NOTE — Progress Notes (Signed)
   THERAPIST PROGRESS NOTE  Session Time: 9:10-10am  Participation Level: Active  Behavioral Response: CasualAlertEuthymic  Type of Therapy: Individual Therapy  Treatment Goals addressed: Coping  Interventions: CBT  Summary: Rodney Friedman is a 71 y.o. male who presents for his individual counseling session the patient. Discussed his psychiatric symptoms and current life events.  Pt had requested to finish his discussion about his mother. Pt has many sad memories of his mother and struggles to forgive her.  He continues to have flashbacks of his childhood. Re-discussed 2 therapeutic methods with pt: empty chair and exposure therapy. Pt is still not sure. ALso suggested writing his mother a letter.He will continue tol think about it before next appointment.  Discussed basic CBT concepts with pt: thought emotion connection.  Suicidal/Homicidal: Nowithout intent/plan  Therapist Response: Assessed pt's current functioning and reviewed progress. Assisted pt processing mother, childhood, CBT.   Plan: Return again in 2 weeks. Wants to begin a discussion on faith. Living in the present,mindfulness. Also wants to discuss his fear of being more than 7 miles away from home.  Diagnosis: Axis I: HX of major depression. Bipolar 1 Disorder  Most recent episode depressed        [place], LCAS 01/10/2017

## 2017-01-17 ENCOUNTER — Other Ambulatory Visit: Payer: Self-pay | Admitting: Cardiovascular Disease

## 2017-01-17 DIAGNOSIS — H401122 Primary open-angle glaucoma, left eye, moderate stage: Secondary | ICD-10-CM | POA: Diagnosis not present

## 2017-01-17 DIAGNOSIS — H401114 Primary open-angle glaucoma, right eye, indeterminate stage: Secondary | ICD-10-CM | POA: Diagnosis not present

## 2017-01-17 DIAGNOSIS — H2513 Age-related nuclear cataract, bilateral: Secondary | ICD-10-CM | POA: Diagnosis not present

## 2017-01-17 DIAGNOSIS — I25709 Atherosclerosis of coronary artery bypass graft(s), unspecified, with unspecified angina pectoris: Secondary | ICD-10-CM

## 2017-01-17 DIAGNOSIS — Z961 Presence of intraocular lens: Secondary | ICD-10-CM | POA: Diagnosis not present

## 2017-01-19 ENCOUNTER — Ambulatory Visit (INDEPENDENT_AMBULATORY_CARE_PROVIDER_SITE_OTHER): Payer: Medicare Other | Admitting: Psychiatry

## 2017-01-19 ENCOUNTER — Encounter (HOSPITAL_COMMUNITY): Payer: Self-pay | Admitting: Psychiatry

## 2017-01-19 VITALS — BP 146/84 | HR 82 | Ht 69.0 in | Wt 226.4 lb

## 2017-01-19 DIAGNOSIS — F3342 Major depressive disorder, recurrent, in full remission: Secondary | ICD-10-CM | POA: Diagnosis not present

## 2017-01-19 DIAGNOSIS — F17210 Nicotine dependence, cigarettes, uncomplicated: Secondary | ICD-10-CM | POA: Diagnosis not present

## 2017-01-19 DIAGNOSIS — Z6379 Other stressful life events affecting family and household: Secondary | ICD-10-CM | POA: Diagnosis not present

## 2017-01-19 DIAGNOSIS — I25709 Atherosclerosis of coronary artery bypass graft(s), unspecified, with unspecified angina pectoris: Secondary | ICD-10-CM | POA: Diagnosis not present

## 2017-01-19 MED ORDER — BUPROPION HCL ER (XL) 150 MG PO TB24: ORAL_TABLET | ORAL | 2 refills | Status: DC

## 2017-01-19 NOTE — Progress Notes (Signed)
Psychiatric Initial Adult Assessment   Patient Identification: Rodney Friedman MRN:  [id number] Date of Evaluation:  01/19/2017 Referral Source: Dr. Nyoka Cowden Chief Complaint: Need follow-up care   Visit Diagnosis: Bipolar disorder, substance use disorder No diagnosis found.  History of Present Illness: Today the patient is doing well. He clearly benefits by being in psychotherapy. He denies the presence of depression. He is sleeping and eating very well. He takes care of his father. He still has a number of family members are that he is financially supporting. The patient has good energy. He is sober since March 2002. Uses no substances. He is active and energetic. He shows no signs of psychosis. The patient is engaging as strong opinions and overall is positive. He's had some issues with his eyes that seem to be stable at this time. He denies chest pain or shortness of breath. He's done very well off Lamictal. He shows no signs of mania. He takes low-dose Wellbutrin. Today he was given warnings about taking Wellbutrin and then suddenly stopping any alcohol and having a seizure. Initially this is not an issue because this patient is sober at this time. The patient is doing very well. Past Psychiatric History: Other psychiatrist care for over 20 years taking mainly Lamictal and Wellbutrin  Previous Psychotropic Medications: Lamictal 300 mg Wellbutrin 150 Substance Abuse History in the last 12 months:  Yes.    Consequences of Substance Abuse: NA  Past Medical History:  Past Medical History:  Diagnosis Date  . Anxiety   . Cataract   . CLL (chronic lymphocytic leukemia) ([place]) 07/08/2013  . Depression   . Diabetes mellitus without complication ([place])   . Eye abnormality    Macular scarring R eye  . Glaucoma   . Heart murmur   . Hypertension   . Leukemia ([place])    CLL  . Myocardial infarction ([place])   . Prostate cancer ([place])   . Prostate cancer ([place]) 07/08/2013  . Substance abuse   . 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 COLONOSCOPY    . 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 EYE SURGERY      Family Psychiatric History:   Family History:  Family History  Problem Relation Age of Onset  . Cancer Mother        colon cancer  . Cancer Father        prostate ca  . Hypertension Father     Social History:   Social History   Social History  . Marital status: Divorced    Spouse name: N/A  . Number of children: 2  . Years of education: 16   Occupational History  . Retired    Social History Main Topics  . Smoking status: Current Every Day Smoker    Packs/day: 0.75    Years: 50.00    Types: Cigarettes  . Smokeless tobacco: Never Used  . Alcohol use No  . Drug use: No  . Sexual activity: Not Currently   Other Topics Concern  . None   Social History Narrative   Lives at home w/ his father   Divorced   Right-handed   Education: College   No caffeine    Additional Social History:   Allergies:   Allergies  Allergen Reactions  . Other Rash    Allergen: "Plants and bushes while doing yard work"    Metabolic Disorder Labs: Lab Results  Component Value Date   HGBA1C 6.3 (  H) 10/26/2016   MPG 117 02/11/2016   MPG 128 (H) 03/22/2015   No results found for: PROLACTIN Lab Results  Component Value Date   CHOL 186 10/26/2016   TRIG 262 (H) 10/26/2016   HDL 31 (L) 10/26/2016   CHOLHDL 6.0 (H) 10/26/2016   VLDL 41 (H) 03/10/2016   LDLCALC 103 (H) 10/26/2016   LDLCALC 103 03/10/2016     Current Medications: Current Outpatient Prescriptions  Medication Sig Dispense Refill  . amLODipine (NORVASC) 10 MG tablet Take 1 tablet (10 mg total) by mouth daily. 90 tablet 1  . B Complex Vitamins (VITAMIN-B COMPLEX PO) Take by mouth.    Marland Kitchen BIOTIN PO Take by mouth.    Marland Kitchen buPROPion (WELLBUTRIN XL) 150 MG 24 hr tablet 1  qam 90 tablet 2  . Calcium Carbonate-Vit D-Min (CALCIUM 1200 PO) Take 1,200 mg by mouth daily.     Marland Kitchen latanoprost (XALATAN) 0.005 % ophthalmic solution Once a day every second day    . LYCOPENE PO Take 8 mg by mouth daily.    . metFORMIN (GLUCOPHAGE) 500 MG tablet Take 1 tablet (500 mg total) by mouth every evening. 90 tablet 1  . metoprolol succinate (TOPROL-XL) 100 MG 24 hr tablet Take 1 tablet (100 mg total) by mouth daily. Take with or immediately following a meal. 90 tablet 1  . Omega-3 Fatty Acids (FISH OIL) 1000 MG CAPS Take 1,000 mg by mouth daily.    . polyethylene glycol powder (GLYCOLAX/MIRALAX) powder TAKE 17 G BY MOUTH DAILY. 527 g 0  . rosuvastatin (CRESTOR) 20 MG tablet Take 1 tablet (20 mg total) by mouth daily. 90 tablet 1  . valsartan (DIOVAN) 320 MG tablet TAKE 1 TABLET BY MOUTH EVERY DAY*PT DUE FOR OFFICE VISIT 30 tablet 0  . vitamin B-12 (CYANOCOBALAMIN) 500 MCG tablet Take 500 mcg by mouth daily.    . vitamin C (ASCORBIC ACID) 500 MG tablet Take 500 mg by mouth daily.    Marland Kitchen warfarin (COUMADIN) 4 MG tablet TAKE 1 TO 1 & 1/2 TABLETS BY MOUTH EVERY DAY AS DIRECTED BY COUMADIN CLINIC 120 tablet 0   No current facility-administered medications for this visit.     Neurologic: Headache: No Seizure: No Paresthesias:No  Musculoskeletal: Strength & Muscle Tone: within normal limits Gait & Station: normal Patient leans: Right  Psychiatric Specialty Exam: ROS  Blood pressure (!) 146/84, pulse 82, height 5\' 9"  (1.753 m), weight 226 lb 6.4 oz (102.7 kg).Body mass index is 33.43 kg/m.  General Appearance: Casual  Eye Contact:  Good  Speech:  Clear and Coherent  Volume:  Normal  Mood:  NA  Affect:  Appropriate  Thought Process:  Goal Directed  Orientation:  NA  Thought Content:  WDL  Suicidal Thoughts:  No  Homicidal Thoughts:  No  Memory:  Negative  Judgement:  Good  Insight:  Good  Psychomotor Activity:  Normal  Concentration:    Recall:  [place] of Knowledge:Good  Language: Good  Akathisia:  No  Handed:  Right  AIMS (if indicated):    Assets:  Desire  for Improvement  ADL's:  Intact  Cognition: WNL  Sleep:      Treatment Plan Summary: He will continue taking Wellbutrin 150 XL every morning. He'll continue going to AA regular basis apparently goes to NA times. She'll continue in psychotherapy, at least once or twice a month. His psychotherapy seems to be very important for him. The patient denies any chest pain or shortness of  breath. He denies any neurological symptoms at this time. She'll return to see me in 5 months for a 30 minute visit. Jerral Ralph, MD 8/31/20189:05 AM

## 2017-01-24 ENCOUNTER — Ambulatory Visit (HOSPITAL_COMMUNITY): Payer: Self-pay | Admitting: Licensed Clinical Social Worker

## 2017-01-26 ENCOUNTER — Ambulatory Visit (INDEPENDENT_AMBULATORY_CARE_PROVIDER_SITE_OTHER): Payer: Medicare Other | Admitting: Pharmacist

## 2017-01-26 DIAGNOSIS — I480 Paroxysmal atrial fibrillation: Secondary | ICD-10-CM

## 2017-01-26 DIAGNOSIS — I482 Chronic atrial fibrillation, unspecified: Secondary | ICD-10-CM

## 2017-01-26 DIAGNOSIS — Z7901 Long term (current) use of anticoagulants: Secondary | ICD-10-CM

## 2017-01-26 LAB — POCT INR: INR: 1.6

## 2017-02-05 DIAGNOSIS — D1801 Hemangioma of skin and subcutaneous tissue: Secondary | ICD-10-CM | POA: Diagnosis not present

## 2017-02-05 DIAGNOSIS — L821 Other seborrheic keratosis: Secondary | ICD-10-CM | POA: Diagnosis not present

## 2017-02-05 DIAGNOSIS — L814 Other melanin hyperpigmentation: Secondary | ICD-10-CM | POA: Diagnosis not present

## 2017-02-05 DIAGNOSIS — Z23 Encounter for immunization: Secondary | ICD-10-CM | POA: Diagnosis not present

## 2017-02-05 DIAGNOSIS — D485 Neoplasm of uncertain behavior of skin: Secondary | ICD-10-CM | POA: Diagnosis not present

## 2017-02-05 DIAGNOSIS — L859 Epidermal thickening, unspecified: Secondary | ICD-10-CM | POA: Diagnosis not present

## 2017-02-05 DIAGNOSIS — D225 Melanocytic nevi of trunk: Secondary | ICD-10-CM | POA: Diagnosis not present

## 2017-02-05 DIAGNOSIS — I872 Venous insufficiency (chronic) (peripheral): Secondary | ICD-10-CM | POA: Diagnosis not present

## 2017-02-07 ENCOUNTER — Encounter (HOSPITAL_COMMUNITY): Payer: Self-pay | Admitting: Licensed Clinical Social Worker

## 2017-02-07 ENCOUNTER — Ambulatory Visit (INDEPENDENT_AMBULATORY_CARE_PROVIDER_SITE_OTHER): Payer: Medicare Other | Admitting: Licensed Clinical Social Worker

## 2017-02-07 DIAGNOSIS — F3130 Bipolar disorder, current episode depressed, mild or moderate severity, unspecified: Secondary | ICD-10-CM | POA: Diagnosis not present

## 2017-02-07 NOTE — Progress Notes (Signed)
   THERAPIST PROGRESS NOTE  Session Time: 9:10-10am  Participation Level: Active  Behavioral Response: CasualAlertEuthymic  Type of Therapy: Individual Therapy  Treatment Goals addressed: Coping  Interventions: CBT  Summary: Rodney Friedman is a 71 y.o. male who presents for his individual counseling session the patient. Discussed his psychiatric symptoms and current life events.  Pt updated his GPS so is going to try to experiment by going out in the community, a few miles from home. Congratulated pt on working through his fear of getting lost.  Pt worked Sunday with his sponsor and worked on steps 6&7. Discussed steps with pt, his sobriety and 12-step meetings.  Pt spoke of his grandchildren and his financial responsibilities.Pt wanted to talk about his faith today and how his faith manifests in his life. Asked pt open ended questions. Pt was open and honest in the discussion.   Suicidal/Homicidal: Nowithout intent/plan  Therapist Response: Assessed pt's current functioning and reviewed progress. Assisted pt processing sobriety, financial responsibilities for grandchildren, faith.   Plan: Return again in 2 weeks. Discussed with pt the possibility of transferring him to another therapist in the office. Diagnosis: Axis I: HX of major depression. Bipolar 1 Disorder  Most recent episode depressed        Sibyl Mikula S, LCAS 02/07/2017

## 2017-02-08 ENCOUNTER — Ambulatory Visit (INDEPENDENT_AMBULATORY_CARE_PROVIDER_SITE_OTHER): Payer: Medicare Other | Admitting: Family Medicine

## 2017-02-08 ENCOUNTER — Encounter: Payer: Self-pay | Admitting: Family Medicine

## 2017-02-08 ENCOUNTER — Ambulatory Visit (INDEPENDENT_AMBULATORY_CARE_PROVIDER_SITE_OTHER): Payer: Medicare Other

## 2017-02-08 VITALS — BP 128/68 | HR 62 | Temp 97.4°F | Ht 70.0 in | Wt 224.5 lb

## 2017-02-08 VITALS — BP 132/66 | HR 64 | Temp 98.0°F | Resp 16 | Ht 68.9 in | Wt 225.0 lb

## 2017-02-08 DIAGNOSIS — Z0000 Encounter for general adult medical examination without abnormal findings: Secondary | ICD-10-CM | POA: Diagnosis not present

## 2017-02-08 DIAGNOSIS — E118 Type 2 diabetes mellitus with unspecified complications: Secondary | ICD-10-CM | POA: Diagnosis not present

## 2017-02-08 DIAGNOSIS — E119 Type 2 diabetes mellitus without complications: Secondary | ICD-10-CM | POA: Diagnosis not present

## 2017-02-08 DIAGNOSIS — Z23 Encounter for immunization: Secondary | ICD-10-CM

## 2017-02-08 DIAGNOSIS — I25709 Atherosclerosis of coronary artery bypass graft(s), unspecified, with unspecified angina pectoris: Secondary | ICD-10-CM | POA: Diagnosis not present

## 2017-02-08 DIAGNOSIS — G4733 Obstructive sleep apnea (adult) (pediatric): Secondary | ICD-10-CM

## 2017-02-08 DIAGNOSIS — E785 Hyperlipidemia, unspecified: Secondary | ICD-10-CM

## 2017-02-08 DIAGNOSIS — Z1211 Encounter for screening for malignant neoplasm of colon: Secondary | ICD-10-CM | POA: Diagnosis not present

## 2017-02-08 DIAGNOSIS — I10 Essential (primary) hypertension: Secondary | ICD-10-CM

## 2017-02-08 MED ORDER — ROSUVASTATIN CALCIUM 20 MG PO TABS: 20.0000 mg | ORAL_TABLET | Freq: Every day | ORAL | 1 refills | Status: DC

## 2017-02-08 MED ORDER — METOPROLOL SUCCINATE ER 100 MG PO TB24: 100.0000 mg | ORAL_TABLET | Freq: Every day | ORAL | 1 refills | Status: DC

## 2017-02-08 MED ORDER — AMLODIPINE BESYLATE 10 MG PO TABS: 10.0000 mg | ORAL_TABLET | Freq: Every day | ORAL | 1 refills | Status: DC

## 2017-02-08 MED ORDER — METFORMIN HCL 500 MG PO TABS: 500.0000 mg | ORAL_TABLET | Freq: Every evening | ORAL | 1 refills | Status: DC

## 2017-02-08 NOTE — Progress Notes (Signed)
Subjective:   Rodney Friedman is a 71 y.o. male who presents for an Initial Medicare Annual Wellness Visit.  Review of Systems  N/A Cardiac Risk Factors include: advanced age (>39men, >23 women);diabetes mellitus;dyslipidemia;hypertension;male gender;smoking/ tobacco exposure;obesity (BMI >30kg/m2)    Objective:    Today's Vitals   02/08/17 0827  BP: 128/68  Pulse: 62  Temp: (!) 97.4 F (36.3 C)  TempSrc: Oral  Weight: 224 lb 8 oz (101.8 kg)  Height: 5\' 10"  (1.778 m)   Body mass index is 32.21 kg/m.  Current Medications (verified) Outpatient Encounter Prescriptions as of 02/08/2017  Medication Sig  . amLODipine (NORVASC) 10 MG tablet Take 1 tablet (10 mg total) by mouth daily.  . B Complex Vitamins (VITAMIN-B COMPLEX PO) Take by mouth.  Marland Kitchen BIOTIN PO Take by mouth.  Marland Kitchen buPROPion (WELLBUTRIN XL) 150 MG 24 hr tablet 1  qam  . Calcium Carbonate-Vit D-Min (CALCIUM 1200 PO) Take 1,200 mg by mouth daily.  Marland Kitchen latanoprost (XALATAN) 0.005 % ophthalmic solution Once a day every second day  . LYCOPENE PO Take 8 mg by mouth daily.  . metFORMIN (GLUCOPHAGE) 500 MG tablet Take 1 tablet (500 mg total) by mouth every evening.  . metoprolol succinate (TOPROL-XL) 100 MG 24 hr tablet Take 1 tablet (100 mg total) by mouth daily. Take with or immediately following a meal.  . Omega-3 Fatty Acids (FISH OIL) 1000 MG CAPS Take 1,000 mg by mouth daily.  . polyethylene glycol powder (GLYCOLAX/MIRALAX) powder TAKE 17 G BY MOUTH DAILY.  . rosuvastatin (CRESTOR) 20 MG tablet Take 1 tablet (20 mg total) by mouth daily.  . valsartan (DIOVAN) 320 MG tablet TAKE 1 TABLET BY MOUTH EVERY DAY*PT DUE FOR OFFICE VISIT  . vitamin B-12 (CYANOCOBALAMIN) 500 MCG tablet Take 500 mcg by mouth daily.  . vitamin C (ASCORBIC ACID) 500 MG tablet Take 500 mg by mouth daily.  Marland Kitchen warfarin (COUMADIN) 4 MG tablet TAKE 1 TO 1 & 1/2 TABLETS BY MOUTH EVERY DAY AS DIRECTED BY COUMADIN CLINIC   No facility-administered encounter  medications on file as of 02/08/2017.     Allergies (verified) Other   History: Past Medical History:  Diagnosis Date  . Anxiety   . Cataract   . 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 Depression   . Diabetes mellitus without complication ([place])   . Eye abnormality    Macular scarring R eye  . Glaucoma   . Heart murmur   . Hypertension   . Leukemia ([place])    CLL  . Myocardial infarction ([place])   . Prostate cancer ([place])   . Prostate cancer ([place]) 07/08/2013  . Substance abuse   . 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 COLONOSCOPY    . 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 EYE SURGERY     Family History  Problem Relation Age of Onset  . Cancer Mother        colon cancer  . Cancer Father        prostate ca  . Hypertension Father    Social History   Occupational History  . Retired    Social History Main Topics  . Smoking status: Current Every Day Smoker    Packs/day: 0.75    Years: 50.00    Types: Cigarettes  . Smokeless tobacco: Never Used  . Alcohol use No  . Drug use: No  . Sexual activity: Not Currently   Tobacco Counseling  Ready to quit: No Counseling given: Not Answered   Activities of Daily Living In your present state of health, do you have any difficulty performing the following activities: 02/08/2017  Hearing? N  Vision? N  Difficulty concentrating or making decisions? N  Walking or climbing stairs? N  Dressing or bathing? N  Doing errands, shopping? N  Preparing Food and eating ? N  Using the Toilet? N  In the past six months, have you accidently leaked urine? N  Do you have problems with loss of bowel control? N  Managing your Medications? N  Managing your Finances? N  Housekeeping or managing your Housekeeping? N  Some recent data might be hidden    Immunizations and Health Maintenance Immunization History  Administered Date(s) Administered  . Influenza Split 02/17/2015    . Influenza,inj,Quad PF,6+ Mos 02/11/2016, 02/08/2017  . Influenza-Unspecified 02/17/2015  . Pneumococcal Conjugate-13 02/24/2014  . Pneumococcal Polysaccharide-23 09/08/2015  . Tdap 09/08/2015  . Zoster 05/22/2013   There are no preventive care reminders to display for this patient.  Patient Care Team: Wendie Agreste, MD as PCP - General (Family Medicine) Kathie Rhodes, MD as Consulting Physician (Urology) Croitoru, Dani Gobble, MD as Consulting Physician (Cardiology)  Indicate any recent Medical Services you may have received from other than [facility] providers in the past year (date may be approximate).    Assessment:   This is a routine wellness examination for Rodney Friedman.   Hearing/Vision screen Vision Screening Comments: Patient sees Dr[facility] Brigitte [facility] at [facility] for his regular eye exams.   Dietary issues and exercise activities discussed: Current Exercise Habits: Home exercise routine (stays active in the yard), Type of exercise: strength training/weights, Time (Minutes): 30, Frequency (Times/Week): 4, Weekly Exercise (Minutes/Week): 120, Intensity: Moderate, Exercise limited by: None identified  Goals    . Reduce sugar intake daily          Patient states that he is reducing his sugar intake daily.       Depression Screen PHQ 2/9 Scores 02/08/2017 02/11/2016 09/08/2015 03/22/2015  PHQ - 2 Score 2 0 0 0  PHQ- 9 Score 8 - - -    Fall Risk Fall Risk  02/08/2017 10/26/2016 02/11/2016 09/08/2015 03/22/2015  Falls in the past year? No No No Yes No    Cognitive Function:     6CIT Screen 02/08/2017  What Year? 0 points  What month? 0 points  What time? 0 points  Count back from 20 0 points  Months in reverse 0 points  Repeat phrase 0 points  Total Score 0    Screening Tests Health Maintenance  Topic Date Due  . HEMOGLOBIN A1C  04/27/2017  . OPHTHALMOLOGY EXAM  05/23/2017  . FOOT EXAM  10/26/2017  . COLONOSCOPY  08/28/2022  . TETANUS/TDAP  09/07/2025  . INFLUENZA VACCINE   Addressed  . Hepatitis C Screening  Completed  . PNA vac Low Risk Adult  Completed        Plan:   I have personally reviewed and noted the following in the patient's chart:   . Medical and social history . Use of alcohol, tobacco or illicit drugs  . Current medications and supplements . Functional ability and status . Nutritional status . Physical activity . Advanced directives . List of other physicians . Hospitalizations, surgeries, and ER visits in previous 12 months . Vitals . Screenings to include cognitive, depression, and falls . Referrals and appointments  In addition, I have reviewed and discussed with patient certain preventive  protocols, quality metrics, and best practice recommendations. A written personalized care plan for preventive services as well as general preventive health recommendations were provided to patient.  Lab work ordered.    Andrez Grime, LPN   02/11/3006

## 2017-02-08 NOTE — Patient Instructions (Addendum)
[name] , Thank you for taking time to come for your Medicare Wellness Visit. I appreciate your ongoing commitment to your health goals. Please review the following plan we discussed and let me know if I can assist you in the future.   Screening recommendations/referrals: Colonoscopy: up to date, next due 08/28/2022 Recommended yearly ophthalmology/optometry visit for glaucoma screening and checkup Recommended yearly dental visit for hygiene and checkup  Vaccinations: Influenza vaccine: administered today  Pneumococcal vaccine: up to date Tdap vaccine: up to date, next due 09/07/2025 Shingles vaccine: up to date    Advanced directives: copy in chart  Conditions/risks identified: Continue reducing your sugar intake daily.  Next appointment: today @ 9:20 am  Preventive Care 65 Years and Older, Male Preventive care refers to lifestyle choices and visits with your health care provider that can promote health and wellness. What does preventive care include?  A yearly physical exam. This is also called an annual well check.  Dental exams once or twice a year.  Routine eye exams. Ask your health care provider how often you should have your eyes checked.  Personal lifestyle choices, including:  Daily care of your teeth and gums.  Regular physical activity.  Eating a healthy diet.  Avoiding tobacco and drug use.  Limiting alcohol use.  Practicing safe sex.  Taking low doses of aspirin every day.  Taking vitamin and mineral supplements as recommended by your health care provider. What happens during an annual well check? The services and screenings done by your health care provider during your annual well check will depend on your age, overall health, lifestyle risk factors, and family history of disease. Counseling  Your health care provider may ask you questions about your:  Alcohol use.  Tobacco use.  Drug use.  Emotional well-being.  Home and relationship  well-being.  Sexual activity.  Eating habits.  History of falls.  Memory and ability to understand (cognition).  Work and work Statistician. Screening  You may have the following tests or measurements:  Height, weight, and BMI.  Blood pressure.  Lipid and cholesterol levels. These may be checked every 5 years, or more frequently if you are over 81 years old.  Skin check.  Lung cancer screening. You may have this screening every year starting at age 28 if you have a 30-pack-year history of smoking and currently smoke or have quit within the past 15 years.  Fecal occult blood test (FOBT) of the stool. You may have this test every year starting at age 19.  Flexible sigmoidoscopy or colonoscopy. You may have a sigmoidoscopy every 5 years or a colonoscopy every 10 years starting at age 34.  Prostate cancer screening. Recommendations will vary depending on your family history and other risks.  Hepatitis C blood test.  Hepatitis B blood test.  Sexually transmitted disease (STD) testing.  Diabetes screening. This is done by checking your blood sugar (glucose) after you have not eaten for a while (fasting). You may have this done every 1-3 years.  Abdominal aortic aneurysm (AAA) screening. You may need this if you are a current or former smoker.  Osteoporosis. You may be screened starting at age 69 if you are at high risk. Talk with your health care provider about your test results, treatment options, and if necessary, the need for more tests. Vaccines  Your health care provider may recommend certain vaccines, such as:  Influenza vaccine. This is recommended every year.  Tetanus, diphtheria, and acellular pertussis (Tdap, Td) vaccine. You  may need a Td booster every 10 years.  Zoster vaccine. You may need this after age 82.  Pneumococcal 13-valent conjugate (PCV13) vaccine. One dose is recommended after age 79.  Pneumococcal polysaccharide (PPSV23) vaccine. One dose is  recommended after age 81. Talk to your health care provider about which screenings and vaccines you need and how often you need them. This information is not intended to replace advice given to you by your health care provider. Make sure you discuss any questions you have with your health care provider. Document Released: 06/04/2015 Document Revised: 01/26/2016 Document Reviewed: 03/09/2015 Elsevier Interactive Patient Education  2017 [place] Prevention in the Home Falls can cause injuries. They can happen to people of all ages. There are many things you can do to make your home safe and to help prevent falls. What can I do on the outside of my home?  Regularly fix the edges of walkways and driveways and fix any cracks.  Remove anything that might make you trip as you walk through a door, such as a raised step or threshold.  Trim any bushes or trees on the path to your home.  Use bright outdoor lighting.  Clear any walking paths of anything that might make someone trip, such as rocks or tools.  Regularly check to see if handrails are loose or broken. Make sure that both sides of any steps have handrails.  Any raised decks and porches should have guardrails on the edges.  Have any leaves, snow, or ice cleared regularly.  Use sand or salt on walking paths during winter.  Clean up any spills in your garage right away. This includes oil or grease spills. What can I do in the bathroom?  Use night lights.  Install grab bars by the toilet and in the tub and shower. Do not use towel bars as grab bars.  Use non-skid mats or decals in the tub or shower.  If you need to sit down in the shower, use a plastic, non-slip stool.  Keep the floor dry. Clean up any water that spills on the floor as soon as it happens.  Remove soap buildup in the tub or shower regularly.  Attach bath mats securely with double-sided non-slip rug tape.  Do not have throw rugs and other things on  the floor that can make you trip. What can I do in the bedroom?  Use night lights.  Make sure that you have a light by your bed that is easy to reach.  Do not use any sheets or blankets that are too big for your bed. They should not hang down onto the floor.  Have a firm chair that has side arms. You can use this for support while you get dressed.  Do not have throw rugs and other things on the floor that can make you trip. What can I do in the kitchen?  Clean up any spills right away.  Avoid walking on wet floors.  Keep items that you use a lot in easy-to-reach places.  If you need to reach something above you, use a strong step stool that has a grab bar.  Keep electrical cords out of the way.  Do not use floor polish or wax that makes floors slippery. If you must use wax, use non-skid floor wax.  Do not have throw rugs and other things on the floor that can make you trip. What can I do with my stairs?  Do not leave any items  on the stairs.  Make sure that there are handrails on both sides of the stairs and use them. Fix handrails that are broken or loose. Make sure that handrails are as long as the stairways.  Check any carpeting to make sure that it is firmly attached to the stairs. Fix any carpet that is loose or worn.  Avoid having throw rugs at the top or bottom of the stairs. If you do have throw rugs, attach them to the floor with carpet tape.  Make sure that you have a light switch at the top of the stairs and the bottom of the stairs. If you do not have them, ask someone to add them for you. What else can I do to help prevent falls?  Wear shoes that:  Do not have high heels.  Have rubber bottoms.  Are comfortable and fit you well.  Are closed at the toe. Do not wear sandals.  If you use a stepladder:  Make sure that it is fully opened. Do not climb a closed stepladder.  Make sure that both sides of the stepladder are locked into place.  Ask someone to  hold it for you, if possible.  Clearly mark and make sure that you can see:  Any grab bars or handrails.  First and last steps.  Where the edge of each step is.  Use tools that help you move around (mobility aids) if they are needed. These include:  Canes.  Walkers.  Scooters.  Crutches.  Turn on the lights when you go into a dark area. Replace any light bulbs as soon as they burn out.  Set up your furniture so you have a clear path. Avoid moving your furniture around.  If any of your floors are uneven, fix them.  If there are any pets around you, be aware of where they are.  Review your medicines with your doctor. Some medicines can make you feel dizzy. This can increase your chance of falling. Ask your doctor what other things that you can do to help prevent falls. This information is not intended to replace advice given to you by your health care provider. Make sure you discuss any questions you have with your health care provider. Document Released: 03/04/2009 Document Revised: 10/14/2015 Document Reviewed: 06/12/2014 Elsevier Interactive Patient Education  2017 [employer].

## 2017-02-08 NOTE — Patient Instructions (Addendum)
If refills of valsartan are needed, may need to change to losartan due to valsartan being on back order/recall.   No change in other meds for now.   I referred you to gastroenterology.    IF you received an x-ray today, you will receive an invoice from [facility]. Please contact [facility] at [phone] with questions or concerns regarding your invoice.   IF you received labwork today, you will receive an invoice from [place]. Please contact [facility] at [phone] with questions or concerns regarding your invoice.   Our billing staff will not be able to assist you with questions regarding bills from these companies.  You will be contacted with the lab results as soon as they are available. The fastest way to get your results is to activate your My Chart account. Instructions are located on the last page of this paperwork. If you have not heard from [place] regarding the results in 2 weeks, please contact this office.

## 2017-02-08 NOTE — Progress Notes (Signed)
Subjective:  By signing my name below, I, Rodney Friedman, attest that this documentation has been prepared under the direction and in the presence of [name], MD. Electronically Signed: Moises Friedman, [place]. 02/08/2017 , 10:41 AM .  Patient was seen in Room 12 .   Patient ID: Rodney Friedman, male    DOB: Mar 10, 1946, 71 y.o.   MRN: [id number] Chief Complaint  Patient presents with  . Diabetes    follow-up  . Hyperlipidemia  . Hypertension   HPI Rodney Friedman is a 71 y.o. male Here for follow up. He is fasting today.   DM Lab Results  Component Value Date   HGBA1C 6.3 (H) 10/26/2016   Lab Results  Component Value Date   MICROALBUR 1.9 10/08/2014   He was treated with diet and metformin 500mg  QD.   Wt Readings from Last 3 Encounters:  02/08/17 225 lb (102.1 kg)  02/08/17 224 lb 8 oz (101.8 kg)  01/01/17 227 lb 14.4 oz (103.4 kg)   He tried to adjust diet when discussed at last visit. He is on a statin.   He hasn't been checking his Friedman sugar at home. He denies any new side effects with his metformin. He denies diarrhea.   Hyperlipidemia Lab Results  Component Value Date   CHOL 186 10/26/2016   HDL 31 (L) 10/26/2016   LDLCALC 103 (H) 10/26/2016   TRIG 262 (H) 10/26/2016   CHOLHDL 6.0 (H) 10/26/2016   Lab Results  Component Value Date   ALT 23 10/26/2016   AST 20 10/26/2016   ALKPHOS 41 10/26/2016   BILITOT 0.4 10/26/2016   He takes Crestor 20mg  QD, changed at last visit. He denies any new side effects with his medication.   CLL (chronic lymphocytic leukemia) He is followed by Dr. Alvy Bimler, last office visit on Aug 13th. He had stable WBC count, plan on observation with yearly follow up.   History of prostate cancer He is followed by urology, Dr. Karsten Ro.   Bipolar disorder/depression He is followed by Dr. Casimiro Needle.   Atrial Fibrillation He is followed by Dr. Sallyanne Kuster. He has a history of CABG and CAD. He takes coumadin and is monitored by  coumadin clinic.   HTN with history of OSA He did not tolerate cpap machine; plan for possible dental device. Boderline elevation seen in June. He was continued on Norvasc, Diovan, and toprol at same doses.   He isn't going to try to dental device due to too much on his plate right now. He notes sleeping well currently. He denies chest pain, shortness of breath, lightheadedness, or dizziness.   Cancer Screening Colonoscopy: he has had too much on his plate and hasn't had anyone to drive him. He has an acquaintance from [place] who will go with him. He requests referral to [facility].   Tobacco abuse Cessation discussed last visit. He is still smoking 10-12 cigarettes a day.   Patient Active Problem List   Diagnosis Date Noted  . Paroxysmal atrial fibrillation ([place]) 04/21/2016  . Diabetes mellitus ([place]) 04/21/2016  . OSA (obstructive sleep apnea) 04/21/2016  . Quality of life palliative care encounter 01/03/2016  . Bilateral carotid bruits without stenosis 01/02/2016  . Tremor, essential 09/22/2015  . Glaucoma, open angle 06/16/2015  . 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  . Pseudoaphakia 06/15/2015  . CD (contact dermatitis) 04/29/2014  . 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 Hematuria 01/05/2014  . Skin lesion 01/05/2014  . Mild aortic valve stenosis 01/03/2014  . Hypertensive  heart disease 01/03/2014  . Hyperlipidemia 01/03/2014  . Mild obesity 01/03/2014  . Tobacco abuse 01/03/2014  . Chronic atrial fibrillation ([place]) 12/31/2013  . Long term current use of anticoagulant therapy 12/31/2013  . Cataract, nuclear 10/06/2013  . Dermatochalasis of eyelid 10/06/2013  . 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 CAD (coronary artery disease) 09/03/2013  . Pre-diabetes 09/03/2013  . CLL (chronic lymphocytic leukemia) ([place]) 07/08/2013  . Prostate cancer ([place]) 07/08/2013   Past Medical History:  Diagnosis Date  . Anxiety   . Cataract   . CLL (chronic lymphocytic leukemia)  ([place]) 07/08/2013  . Depression   . Diabetes mellitus without complication ([place])   . Eye abnormality    Macular scarring R eye  . Glaucoma   . Heart murmur   . Hypertension   . Leukemia ([place])    CLL  . Myocardial infarction ([place])   . Prostate cancer ([place])   . Prostate cancer ([place]) 07/08/2013  . Substance abuse   . 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 COLONOSCOPY    . 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 EYE SURGERY     Allergies  Allergen Reactions  . Other Rash    Allergen: "Plants and bushes while doing yard work"   Prior to Admission medications   Medication Sig Start Date End Date Taking? Authorizing Provider  amLODipine (NORVASC) 10 MG tablet Take 1 tablet (10 mg total) by mouth daily. 10/26/16   Wendie Agreste, MD  B Complex Vitamins (VITAMIN-B COMPLEX PO) Take by mouth.    [provider]  BIOTIN PO Take by mouth.    [provider]  buPROPion (WELLBUTRIN XL) 150 MG 24 hr tablet 1  qam 01/19/17   Plovsky, Berneta Sages, MD  Calcium Carbonate-Vit D-Min (CALCIUM 1200 PO) Take 1,200 mg by mouth daily.    [provider]  latanoprost (XALATAN) 0.005 % ophthalmic solution Once a day every second day 06/30/13   [provider]  LYCOPENE PO Take 8 mg by mouth daily.    [provider]  metFORMIN (GLUCOPHAGE) 500 MG tablet Take 1 tablet (500 mg total) by mouth every evening. 10/26/16   Wendie Agreste, MD  metoprolol succinate (TOPROL-XL) 100 MG 24 hr tablet Take 1 tablet (100 mg total) by mouth daily. Take with or immediately following a meal. 10/26/16   Wendie Agreste, MD  Omega-3 Fatty Acids (FISH OIL) 1000 MG CAPS Take 1,000 mg by mouth daily.    [provider]  polyethylene glycol powder (GLYCOLAX/MIRALAX) powder TAKE 17 G BY MOUTH DAILY. 01/05/17   Wendie Agreste, MD  rosuvastatin (CRESTOR) 20 MG tablet Take 1 tablet (20 mg total) by mouth daily. 11/08/16   Wendie Agreste,  MD  valsartan (DIOVAN) 320 MG tablet TAKE 1 TABLET BY MOUTH EVERY DAY*PT DUE FOR OFFICE VISIT 01/01/17   Heath Lark, MD  vitamin B-12 (CYANOCOBALAMIN) 500 MCG tablet Take 500 mcg by mouth daily.    [provider]  vitamin C (ASCORBIC ACID) 500 MG tablet Take 500 mg by mouth daily.    [provider]  warfarin (COUMADIN) 4 MG tablet TAKE 1 TO 1 & 1/2 TABLETS BY MOUTH EVERY DAY AS DIRECTED BY COUMADIN CLINIC 01/17/17   Croitoru, Dani Gobble, MD   Social History   Social History  . Marital status: Divorced    Spouse name: N/A  . Number of children: 2  . Years of education: 58  Occupational History  . Retired    Social History Main Topics  . Smoking status: Current Every Day Smoker    Packs/day: 0.75    Years: 50.00    Types: Cigarettes  . Smokeless tobacco: Never Used  . Alcohol use No  . Drug use: No  . Sexual activity: Not Currently   Other Topics Concern  . Not on file   Social History Narrative   Lives at home w/ his father   Divorced   Right-handed   Education: College   No caffeine   Review of Systems  Constitutional: Negative for fatigue and unexpected weight change.  Eyes: Negative for visual disturbance.  Respiratory: Negative for cough, chest tightness and shortness of breath.   Cardiovascular: Negative for chest pain, palpitations and leg swelling.  Gastrointestinal: Negative for abdominal pain and Friedman in stool.  Neurological: Negative for dizziness, light-headedness and headaches.       Objective:   Physical Exam  Constitutional: He is oriented to person, place, and time. He appears well-developed and well-nourished.  HENT:  Head: Normocephalic and atraumatic.  Eyes: Pupils are equal, round, and reactive to light. EOM are normal.  Neck: No JVD present. Carotid bruit is not present.  Cardiovascular: Normal rate, regular rhythm and normal heart sounds.   No murmur heard. Pulmonary/Chest: Effort normal and breath sounds normal. He has no  rales.  Musculoskeletal: He exhibits no edema.  Neurological: He is alert and oriented to person, place, and time.  Skin: Skin is warm and dry.  Psychiatric: He has a normal mood and affect.  Vitals reviewed.   Vitals:   02/08/17 1007  BP: 132/66  Pulse: 64  Resp: 16  Temp: 98 F (36.7 C)  TempSrc: Oral  SpO2: 97%  Weight: 225 lb (102.1 kg)  Height: 5' 8.9" (1.75 m)      Assessment & Plan:   Ashford Clouse is a 72 y.o. male Type 2 diabetes mellitus without complication, without long-term current use of insulin ([place]) - Plan: metFORMIN (GLUCOPHAGE) 500 MG tablet  - Continue same dose of metformin. Recheck 3 months. Check urine microalbumin  Essential hypertension - Plan: amLODipine (NORVASC) 10 MG tablet, metoprolol succinate (TOPROL-XL) 100 MG 24 hr tablet, Comprehensive metabolic panel  -Overall stable. Tolerating current medications, no changes. CMP pending.  OSA (obstructive sleep apnea)  -Asymptomatic recently, consider dental device.  Hyperlipidemia, unspecified hyperlipidemia type - Plan: rosuvastatin (CRESTOR) 20 MG tablet, Comprehensive metabolic panel, Lipid panel  -Tolerating Crestor. Continue same dose, check CMP, lipids.  Special screening for malignant neoplasms, colon - Plan: Ambulatory referral to Gastroenterology  -He now has ability to have ride for procedure. Referred to Gastroenterology for colonoscopy  Meds ordered this encounter  Medications  . amLODipine (NORVASC) 10 MG tablet    Sig: Take 1 tablet (10 mg total) by mouth daily.    Dispense:  90 tablet    Refill:  1    Needs office visit for refills  . metFORMIN (GLUCOPHAGE) 500 MG tablet    Sig: Take 1 tablet (500 mg total) by mouth every evening.    Dispense:  90 tablet    Refill:  1  . metoprolol succinate (TOPROL-XL) 100 MG 24 hr tablet    Sig: Take 1 tablet (100 mg total) by mouth daily. Take with or immediately following a meal.    Dispense:  90 tablet    Refill:  1  . rosuvastatin  (CRESTOR) 20 MG tablet    Sig: Take 1 tablet (  20 mg total) by mouth daily.    Dispense:  90 tablet    Refill:  1   Patient Instructions   If refills of valsartan are needed, may need to change to losartan due to valsartan being on back order/recall.   No change in other meds for now.   I referred you to gastroenterology.    IF you received an x-ray today, you will receive an invoice from [facility]. Please contact [facility] at [phone] with questions or concerns regarding your invoice.   IF you received labwork today, you will receive an invoice from [facility]. Please contact [facility] at [phone] with questions or concerns regarding your invoice.   Our billing staff will not be able to assist you with questions regarding bills from these companies.  You will be contacted with the lab results as soon as they are available. The fastest way to get your results is to activate your My Chart account. Instructions are located on the last page of this paperwork. If you have not heard from [place] regarding the results in 2 weeks, please contact this office.       I personally performed the services described in this documentation, which was scribed in my presence. The recorded information has been reviewed and considered for accuracy and completeness, addended by me as needed, and agree with information above.  Signed,   [name], MD [facility].  02/11/17 12:17 PM

## 2017-02-09 ENCOUNTER — Encounter: Payer: Self-pay | Admitting: Family Medicine

## 2017-02-09 ENCOUNTER — Encounter: Payer: Self-pay | Admitting: Gastroenterology

## 2017-02-09 DIAGNOSIS — E119 Type 2 diabetes mellitus without complications: Secondary | ICD-10-CM | POA: Diagnosis not present

## 2017-02-09 LAB — COMPREHENSIVE METABOLIC PANEL
ALBUMIN: 4.3 g/dL (ref 3.6–4.8)
ALT: 20 IU/L (ref 0–44)
AST: 19 IU/L (ref 0–40)
Albumin/Globulin Ratio: 2 (ref 1.2–2.2)
Alkaline Phosphatase: 37 IU/L — ABNORMAL LOW (ref 39–117)
BUN / CREAT RATIO: 12 (ref 10–24)
BUN: 12 mg/dL (ref 8–27)
Bilirubin Total: 0.3 mg/dL (ref 0.0–1.2)
CALCIUM: 9.2 mg/dL (ref 8.6–10.2)
CHLORIDE: 107 mmol/L — AB (ref 96–106)
CO2: 20 mmol/L (ref 20–29)
CREATININE: 1.01 mg/dL (ref 0.76–1.27)
GFR calc non Af Amer: 76 mL/min/{1.73_m2} (ref >59)
GFR, EST AFRICAN AMERICAN: 87 mL/min/{1.73_m2} (ref >59)
GLUCOSE: 121 mg/dL — AB (ref 65–99)
Globulin, Total: 2.2 g/dL (ref 1.5–4.5)
Potassium: 4.3 mmol/L (ref 3.5–5.2)
Sodium: 142 mmol/L (ref 134–144)
TOTAL PROTEIN: 6.5 g/dL (ref 6.0–8.5)

## 2017-02-09 LAB — LIPID PANEL
Chol/HDL Ratio: 4.9 ratio (ref 0.0–5.0)
Cholesterol, Total: 131 mg/dL (ref 100–199)
HDL: 27 mg/dL — AB (ref >39)
LDL CALC: 61 mg/dL (ref 0–99)
Triglycerides: 213 mg/dL — ABNORMAL HIGH (ref 0–149)
VLDL CHOLESTEROL CAL: 43 mg/dL — AB (ref 5–40)

## 2017-02-10 LAB — URINALYSIS, COMPLETE
Bilirubin, UA: NEGATIVE
Ketones, UA: NEGATIVE
LEUKOCYTES UA: NEGATIVE
Nitrite, UA: NEGATIVE
Protein, UA: NEGATIVE
RBC, UA: NEGATIVE
SPEC GRAV UA: 1.025 (ref 1.005–1.030)
Urobilinogen, Ur: 0.2 mg/dL (ref 0.2–1.0)
pH, UA: 6 (ref 5.0–7.5)

## 2017-02-10 LAB — MICROALBUMIN, URINE: MICROALBUM., U, RANDOM: 28.8 ug/mL

## 2017-02-10 LAB — MICROSCOPIC EXAMINATION
Casts: NONE SEEN /lpf
Epithelial Cells (non renal): NONE SEEN /hpf (ref 0–10)

## 2017-02-11 ENCOUNTER — Other Ambulatory Visit: Payer: Self-pay | Admitting: Cardiovascular Disease

## 2017-02-11 DIAGNOSIS — I25709 Atherosclerosis of coronary artery bypass graft(s), unspecified, with unspecified angina pectoris: Secondary | ICD-10-CM

## 2017-02-15 ENCOUNTER — Encounter: Payer: Self-pay | Admitting: Family Medicine

## 2017-02-15 DIAGNOSIS — E119 Type 2 diabetes mellitus without complications: Secondary | ICD-10-CM | POA: Diagnosis not present

## 2017-02-15 LAB — HEMOGLOBIN A1C
Est. average glucose Bld gHb Est-mCnc: 140 mg/dL
Hgb A1c MFr Bld: 6.5 % — ABNORMAL HIGH (ref 4.8–5.6)

## 2017-02-15 NOTE — Addendum Note (Signed)
Addended by: Gari Crown D on: 02/15/2017 09:58 AM   Modules accepted: Orders

## 2017-02-21 ENCOUNTER — Ambulatory Visit (HOSPITAL_COMMUNITY): Payer: Self-pay | Admitting: Licensed Clinical Social Worker

## 2017-02-28 ENCOUNTER — Ambulatory Visit (INDEPENDENT_AMBULATORY_CARE_PROVIDER_SITE_OTHER): Payer: Medicare Other | Admitting: Licensed Clinical Social Worker

## 2017-02-28 ENCOUNTER — Encounter (HOSPITAL_COMMUNITY): Payer: Self-pay | Admitting: Licensed Clinical Social Worker

## 2017-02-28 DIAGNOSIS — F3130 Bipolar disorder, current episode depressed, mild or moderate severity, unspecified: Secondary | ICD-10-CM | POA: Diagnosis not present

## 2017-02-28 NOTE — Progress Notes (Signed)
   THERAPIST PROGRESS NOTE  Session Time: 9:00am-10:00am  Participation Level: Active  Behavioral Response: CasualAlertDepressed  Type of Therapy: Individual Therapy  Treatment Goals addressed: Improve psychiatric symptoms, elevate mood (decreased irritability, increased enjoyment of activities), Improve unhelpful thought patterns, emotional regulation skills (reduce temper outburst), Interpersonal relationship skills  Interventions: Motivational Interviewing, CBT, and Other: Grounding and Mindfulness techniques  Summary: Day Krizan is a 71 y.o. male who presents with Bipolar 1 Disorder, Most recent episode depressed  Suicidal/Homicidal: No without intent/plan  Therapist Response:  Harutyun met with clinician for individual therapy. Blayze discussed his psychiatric symptoms and current life events. Greogory shared a brief history of his life, including his relationships with his son, father, daughter, and grandchildren. Brogen identified significant stressors associated with worry about his son and the grandchildren. Harace reports he feels sadness when thinking about his daughter, but has decided to let that sit with the other things out of his control. Nycholas processed thoughts and feelings about his son. Clinician discussed the concept and value of success and happiness. Baraa discussed his vision of success as opposed to his son's vision of success. Johnjoseph identified that although his son does not have a stable job or insurance, he is happy and has a nice enough life. Steadman reports he will continue to support his son, even though he knows he is enabling him. Jevonte maintains his sobriety and continues to work the 12 steps.   Plan: Return again in 1-2 weeks.  Diagnosis:     Axis I: Bipolar 1 Disorder, Most recent episode depressed   Jessica R Schlosberg 02/28/2017  

## 2017-03-01 DIAGNOSIS — C61 Malignant neoplasm of prostate: Secondary | ICD-10-CM | POA: Diagnosis not present

## 2017-03-07 ENCOUNTER — Ambulatory Visit (INDEPENDENT_AMBULATORY_CARE_PROVIDER_SITE_OTHER): Payer: Medicare Other | Admitting: Licensed Clinical Social Worker

## 2017-03-07 ENCOUNTER — Ambulatory Visit (HOSPITAL_COMMUNITY): Payer: Self-pay | Admitting: Licensed Clinical Social Worker

## 2017-03-07 ENCOUNTER — Encounter (HOSPITAL_COMMUNITY): Payer: Self-pay | Admitting: Licensed Clinical Social Worker

## 2017-03-07 DIAGNOSIS — F3130 Bipolar disorder, current episode depressed, mild or moderate severity, unspecified: Secondary | ICD-10-CM | POA: Diagnosis not present

## 2017-03-07 NOTE — Progress Notes (Signed)
   THERAPIST PROGRESS NOTE  Session Time: 9:00am-10:00am  Participation Level: Active  Behavioral Response: CasualAlertAngry and Anxious  Type of Therapy: Individual Therapy  Treatment Goals addressed: Improve psychiatric symptoms, elevate mood (decreased irritability, increased enjoyment of activities), Improve unhelpful thought patterns, emotional regulation skills (reduce temper outburst), Interpersonal relationship skills  Interventions: Motivational Interviewing and Other: Grounding and Mindfulness techniques  Summary: Rodney Friedman is a 71 y.o. male who presents with Bipolar 1 Disorder, Most recent episode depressed  Suicidal/Homicidal: No without intent/plan  Therapist Response:  Sisto met with clinician for individual therapy. Neldon discussed his psychiatric symptoms and current life events. Ha shared increasing anger and frustration with his son. Ousmane reports concerns about his son's future health, need for insurance, stable job, and money. Roddrick reports he knows it is not in his control and he wants to be able to make sure everyone in the family is going to be okay when he dies. Rhyker identified the value of providing for his family, including grandchildren. He also reports he wants to make sure his father will be cared for, if he dies before his father. Greysin reports his plan to bring his grandchildren to [place] from [place] with their adoptive family. Clinician provided support and resources for assistance with housing, Medicaid, and SSD. Clinician encouraged Tadeo to reach out to [place] DSS in order to begin transfer of benefits.  Clinician explored Sakib's attention to the care of others, and explored his own supports and social interactions. Clinician offered options for social clubs, [facility], and other active groups in the area.    Plan: Return again in 1-2 weeks.  Diagnosis:     Axis I: Bipolar 1 Disorder, Most recent episode depressed  Jessica R Schlosberg,  LCSW 03/07/2017  

## 2017-03-08 DIAGNOSIS — Z8546 Personal history of malignant neoplasm of prostate: Secondary | ICD-10-CM | POA: Diagnosis not present

## 2017-03-08 DIAGNOSIS — R9721 Rising PSA following treatment for malignant neoplasm of prostate: Secondary | ICD-10-CM | POA: Diagnosis not present

## 2017-03-09 ENCOUNTER — Ambulatory Visit (INDEPENDENT_AMBULATORY_CARE_PROVIDER_SITE_OTHER): Payer: Medicare Other | Admitting: Pharmacist Clinician (PhC)/ Clinical Pharmacy Specialist

## 2017-03-09 DIAGNOSIS — I480 Paroxysmal atrial fibrillation: Secondary | ICD-10-CM | POA: Diagnosis not present

## 2017-03-09 DIAGNOSIS — I482 Chronic atrial fibrillation, unspecified: Secondary | ICD-10-CM

## 2017-03-09 DIAGNOSIS — Z7901 Long term (current) use of anticoagulants: Secondary | ICD-10-CM

## 2017-03-09 LAB — POCT INR: INR: 2.4

## 2017-03-13 ENCOUNTER — Other Ambulatory Visit: Payer: Self-pay | Admitting: Cardiovascular Disease

## 2017-03-13 DIAGNOSIS — I25709 Atherosclerosis of coronary artery bypass graft(s), unspecified, with unspecified angina pectoris: Secondary | ICD-10-CM

## 2017-03-21 ENCOUNTER — Ambulatory Visit (HOSPITAL_COMMUNITY): Payer: Self-pay | Admitting: Licensed Clinical Social Worker

## 2017-03-21 ENCOUNTER — Encounter (HOSPITAL_COMMUNITY): Payer: Self-pay | Admitting: Licensed Clinical Social Worker

## 2017-03-21 ENCOUNTER — Ambulatory Visit (INDEPENDENT_AMBULATORY_CARE_PROVIDER_SITE_OTHER): Payer: Medicare Other | Admitting: Licensed Clinical Social Worker

## 2017-03-21 DIAGNOSIS — F3130 Bipolar disorder, current episode depressed, mild or moderate severity, unspecified: Secondary | ICD-10-CM | POA: Diagnosis not present

## 2017-03-21 NOTE — Progress Notes (Signed)
   THERAPIST PROGRESS NOTE  Session Time: 9:10-10:10am  Participation Level: Active  Behavioral Response: Well GroomedAlertDepressed and Irritable  Type of Therapy: Individual Therapy  Treatment Goals addressed: Improve psychiatric symptoms, elevate mood (decreased irritability, increased enjoyment of activities), Improve unhelpful thought patterns, emotional regulation skills (reduce temper outburst), Interpersonal relationship skills  Interventions: Motivational Interviewing and Other: Grounding and Mindfulness techniques  Summary: Rodney Friedman is a 71 y.o. male who presents with Bipolar 1 Disorder, Most recent episode depressed  Suicidal/Homicidal: No without intent/plan  Therapist Response:  Rodney Friedman met with clinician for individual therapy. Rodney Friedman discussed his psychiatric symptoms and current life events. Rodney Friedman shared recent concerns about possible Prostate Cancer returning. Rodney Friedman explained how his number have changed over the past several years and his ongoing concerns of pre-deceasing his 48 year old father. Clinician utilized MI OARS to reflect and summarize thoughts and feelings. Clinician also noted positive things to look forward to, including getting things done for grandchildren's adoption and move to [place]. Rodney Friedman reports he has been connected to his church, which has improved his feeling of belongingness and importance. Rodney Friedman reports concerns about his father possibly experiencing some Dementia. Clinician discussed the importance of being calm and understanding, reducing anxiety, and using music to increase harmony in the home. Rodney Friedman left the session feeling more relaxed and empowered.   Plan: Return again in 1-2 weeks.  Diagnosis:     Axis I: Bipolar 1 Disorder, Most recent episode depressed    Rodney Curling, LCSW 03/21/2017

## 2017-03-28 ENCOUNTER — Ambulatory Visit (INDEPENDENT_AMBULATORY_CARE_PROVIDER_SITE_OTHER): Payer: Medicare Other | Admitting: Licensed Clinical Social Worker

## 2017-03-28 ENCOUNTER — Ambulatory Visit (HOSPITAL_COMMUNITY): Payer: Self-pay | Admitting: Licensed Clinical Social Worker

## 2017-03-28 ENCOUNTER — Encounter (HOSPITAL_COMMUNITY): Payer: Self-pay | Admitting: Licensed Clinical Social Worker

## 2017-03-28 DIAGNOSIS — F319 Bipolar disorder, unspecified: Secondary | ICD-10-CM

## 2017-03-28 NOTE — Progress Notes (Signed)
   THERAPIST PROGRESS NOTE  Session Time: 9:00am-10:00am  Participation Level: Active  Behavioral Response: Well GroomedAlertEuthymic  Type of Therapy: Individual Therapy  Treatment Goals addressed: Improve psychiatric symptoms, elevate mood (decreased irritability, increased enjoyment of activities), Improve unhelpful thought patterns, emotional regulation skills (reduce temper outburst), Interpersonal relationship skills  Interventions: Motivational Interviewing and Other: Grounding and Mindfulness techniques  Summary: Rodney Friedman is a 71 y.o. male who presents with Bipolar 1 Disorder, Most recent episode depressed  Suicidal/Homicidal: No without intent/plan  Therapist Response:  [name] met with clinician for individual therapy. Rodney Friedman discussed his psychiatric symptoms and current life events. Rodney Friedman shared ongoing progress in getting his life situated, including transferring the mortgage into his name, as well as communicating with doctors, scheduling appointments for him and father. Rodney Friedman processed his steps in getting medical assistance for his father and himself. Clinician explored the possibility of making friends, getting involved in the community. Rodney Friedman began discussion of his daughter, who is absent from his life. Rodney Friedman processed his thoughts and feelings about her choices, his feelings of sadness about their relationship.   Plan: Return again in 1-2 weeks.  Diagnosis:     Axis I: Bipolar 1 Disorder, Most recent episode depressed  [name] 03/28/2017

## 2017-04-09 ENCOUNTER — Ambulatory Visit (INDEPENDENT_AMBULATORY_CARE_PROVIDER_SITE_OTHER): Payer: Medicare Other | Admitting: Pharmacist Clinician (PhC)/ Clinical Pharmacy Specialist

## 2017-04-09 ENCOUNTER — Ambulatory Visit (INDEPENDENT_AMBULATORY_CARE_PROVIDER_SITE_OTHER): Payer: Medicare Other | Admitting: Cardiovascular Disease

## 2017-04-09 ENCOUNTER — Encounter: Payer: Self-pay | Admitting: Cardiovascular Disease

## 2017-04-09 VITALS — BP 110/52 | HR 58 | Ht 69.0 in | Wt 230.0 lb

## 2017-04-09 DIAGNOSIS — R0989 Other specified symptoms and signs involving the circulatory and respiratory systems: Secondary | ICD-10-CM

## 2017-04-09 DIAGNOSIS — F17200 Nicotine dependence, unspecified, uncomplicated: Secondary | ICD-10-CM | POA: Diagnosis not present

## 2017-04-09 DIAGNOSIS — I10 Essential (primary) hypertension: Secondary | ICD-10-CM | POA: Diagnosis not present

## 2017-04-09 DIAGNOSIS — C9190 Lymphoid leukemia, unspecified not having achieved remission: Secondary | ICD-10-CM | POA: Diagnosis not present

## 2017-04-09 DIAGNOSIS — I209 Angina pectoris, unspecified: Secondary | ICD-10-CM

## 2017-04-09 DIAGNOSIS — I25709 Atherosclerosis of coronary artery bypass graft(s), unspecified, with unspecified angina pectoris: Secondary | ICD-10-CM

## 2017-04-09 DIAGNOSIS — E782 Mixed hyperlipidemia: Secondary | ICD-10-CM

## 2017-04-09 DIAGNOSIS — I25118 Atherosclerotic heart disease of native coronary artery with other forms of angina pectoris: Secondary | ICD-10-CM | POA: Diagnosis not present

## 2017-04-09 DIAGNOSIS — G4733 Obstructive sleep apnea (adult) (pediatric): Secondary | ICD-10-CM | POA: Diagnosis not present

## 2017-04-09 DIAGNOSIS — E119 Type 2 diabetes mellitus without complications: Secondary | ICD-10-CM | POA: Diagnosis not present

## 2017-04-09 DIAGNOSIS — I350 Nonrheumatic aortic (valve) stenosis: Secondary | ICD-10-CM

## 2017-04-09 DIAGNOSIS — I480 Paroxysmal atrial fibrillation: Secondary | ICD-10-CM

## 2017-04-09 DIAGNOSIS — E668 Other obesity: Secondary | ICD-10-CM

## 2017-04-09 DIAGNOSIS — Z7901 Long term (current) use of anticoagulants: Secondary | ICD-10-CM

## 2017-04-09 DIAGNOSIS — C9110 Chronic lymphocytic leukemia of B-cell type not having achieved remission: Secondary | ICD-10-CM

## 2017-04-09 DIAGNOSIS — I482 Chronic atrial fibrillation, unspecified: Secondary | ICD-10-CM

## 2017-04-09 LAB — POCT INR: INR: 3.1

## 2017-04-09 MED ORDER — WARFARIN SODIUM 4 MG PO TABS: ORAL_TABLET | ORAL | 0 refills | Status: DC

## 2017-04-09 NOTE — Patient Instructions (Signed)
Dr Croitoru recommends that you schedule a follow-up appointment in 12 months. You will receive a reminder letter in the mail two months in advance. If you don't receive a letter, please call our office to schedule the follow-up appointment.  If you need a refill on your cardiac medications before your next appointment, please call your pharmacy. 

## 2017-04-09 NOTE — Progress Notes (Signed)
Cardiology Office Note    Date:  04/09/2017   ID:  Rodney Friedman, DOB May 24, 1945, MRN [id number]  PCP:  Wendie Agreste, MD  Cardiologist:   Sanda Klein, MD   chief complaint: daytime fatigue   History of Present Illness:  Rodney Friedman is a 71 y.o. male with CAD and remote CABG, paroxysmal atrial fibrillation, mild aortic stenosis, carotid artery stenosis, hypertension, hyperlipidemia and type 2 diabetes mellitus.  He generally feels well.  Has not had new cardiovascular problems, but is due to undergo colonoscopy and has had a slight increase in his PSA (he underwent radiation therapy for prostate cancer in 2014).  He might need a prostate biopsy if the increase is confirmed at the next test.  He takes care of the entire household and yard without any limitation from cardiac symptoms.  He remains mild to moderately obese with a BMI of 34.  He does not think he has had any interval atrial fibrillation.  He has not had any rapid palpitations, syncope, dizziness, diaphoresis, profound weakness (symptoms he associated with his previous episode of paroxysmal atrial fibrillation).  He has not had any bleeding problems while taking warfarin.  We try to switch him to a direct oral anticoagulant, but he reverted to warfarin due to cost issues.  He continues to be the only caregiver for his nonagenarian father and is proud of how he takes care of him.  On the other hand, he has a lot of difficulty making time for his own medical issues and worries about what would happen if he were to be sick.  He does not have a network of support, since they only recently moved to this area.  His son lives around here, "but is not dependable".  He has a history of coronary disease and underwent 3 vessel bypass surgery in [place] in 2006 ("heartburn" and left antecubital pain ) in July 2014 he had an episode of atrial fibrillation. He has significant left ventricular hypertrophy and very mild aortic valve  stenosis. He has preserved left ventricular systolic function although there is some degree of anterior hypokinesis by previous echo. His nuclear stress test in June 2013 showed normal pattern of perfusion and an ejection fraction of 52%. He has bilateral carotid bruits that are reportedly due to external carotid artery stenoses. His last carotid ultrasound was performed in September 2017 and showed no significant internal carotid stenosis.  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 He has treated prostate cancer and is followed by Dr. Alvy Bimler.  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 Unfortunately he still smokes roughly 10 cigarettes a day , which he believes he needs due to anxiety.   Past Medical History:  Diagnosis Date  . Anxiety   . Cataract   . CLL (chronic lymphocytic leukemia) ([place]) 07/08/2013  . Depression   . Diabetes mellitus without complication ([place])   . Eye abnormality    Macular scarring R eye  . Glaucoma   . Heart murmur   . Hypertension   . Leukemia ([place])    CLL  . Myocardial infarction ([place])   . Prostate cancer ([place])   . Prostate cancer ([place]) 07/08/2013  . Substance abuse ([place])   . 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 COLONOSCOPY    .  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 EYE SURGERY      Current Medications: Outpatient Medications Prior to Visit  Medication Sig Dispense Refill  . amLODipine (NORVASC) 10 MG tablet Take 1 tablet (10 mg total) by mouth daily. 90 tablet 1  . B Complex Vitamins (VITAMIN-B COMPLEX PO) Take by mouth.    Marland Kitchen BIOTIN PO Take by mouth.    Marland Kitchen buPROPion (WELLBUTRIN XL) 150 MG 24 hr tablet 1  qam 90 tablet 2  . Calcium Carbonate-Vit D-Min (CALCIUM 1200 PO) Take 1,200 mg by mouth daily.    Marland Kitchen latanoprost (XALATAN) 0.005 % ophthalmic solution Once a day every  second day    . LYCOPENE PO Take 8 mg by mouth daily.    . metFORMIN (GLUCOPHAGE) 500 MG tablet Take 1 tablet (500 mg total) by mouth every evening. 90 tablet 1  . metoprolol succinate (TOPROL-XL) 100 MG 24 hr tablet Take 1 tablet (100 mg total) by mouth daily. Take with or immediately following a meal. 90 tablet 1  . Omega-3 Fatty Acids (FISH OIL) 1000 MG CAPS Take 1,000 mg by mouth daily.    . polyethylene glycol powder (GLYCOLAX/MIRALAX) powder TAKE 17 G BY MOUTH DAILY. (Patient taking differently: TAKE 17 G BY MOUTH DAILY.; prn) 527 g 0  . rosuvastatin (CRESTOR) 20 MG tablet Take 1 tablet (20 mg total) by mouth daily. 90 tablet 1  . valsartan (DIOVAN) 320 MG tablet TAKE 1 TABLET BY MOUTH EVERY DAY*PT DUE FOR OFFICE VISIT 30 tablet 0  . vitamin B-12 (CYANOCOBALAMIN) 500 MCG tablet Take 500 mcg by mouth daily.    . vitamin C (ASCORBIC ACID) 500 MG tablet Take 500 mg by mouth daily.    Marland Kitchen warfarin (COUMADIN) 4 MG tablet TAKE 1 TO 1 & 1/2 TABLETS BY MOUTH EVERY DAY AS DIRECTED BY COUMADIN CLINIC 120 tablet 0  . warfarin (COUMADIN) 4 MG tablet TAKE 1 TO 1 & 1/2 TABLETS BY MOUTH EVERY DAY AS DIRECTED BY COUMADIN CLINIC 120 tablet 0   No facility-administered medications prior to visit.      Allergies:   Other   Social History   Socioeconomic History  . Marital status: Divorced    Spouse name: None  . Number of children: 2  . Years of education: 16  . Highest education level: None  Social Needs  . Financial resource strain: None  . Food insecurity - worry: None  . Food insecurity - inability: None  . Transportation needs - medical: None  . Transportation needs - non-medical: None  Occupational History  . Occupation: Retired  Tobacco Use  . Smoking status: Current Every Day Smoker    Packs/day: 0.75    Years: 50.00    Pack years: 37.50    Types: Cigarettes  . Smokeless tobacco: Never Used  Substance and Sexual Activity  . Alcohol use: No    Alcohol/week: 0.0 oz  . Drug use: No    . Sexual activity: Not Currently  Other Topics Concern  . None  Social History Narrative   Lives at home w/ his father   Divorced   Right-handed   Education: College   No caffeine     Family History:  The patient's family history includes Cancer in his father and mother; Hypertension in his father.   ROS:   Please see the history of present illness.    ROS All other systems reviewed and are negative.   PHYSICAL EXAM:   VS:  BP Marland Kitchen)  110/52   Pulse (!) 58   Ht 5\' 9"  (1.753 m)   Wt 230 lb (104.3 kg)   BMI 33.97 kg/m     General: Alert, oriented x3, no distress, moderately obese Head: no evidence of trauma, PERRL, EOMI, no exophtalmos or lid lag, no myxedema, no xanthelasma; normal ears, nose and oropharynx Neck: normal jugular venous pulsations and no hepatojugular reflux; brisk carotid pulses without delay but with bilateral carotid bruits Chest: clear to auscultation, no signs of consolidation by percussion or palpation, normal fremitus, symmetrical and full respiratory excursions Cardiovascular: normal position and quality of the apical impulse, regular rhythm, normal first and second heart sounds, 2/6 early peaking aortic ejection murmur, no diastolic murmurs, rubs or gallops Abdomen: no tenderness or distention, no masses by palpation, no abnormal pulsatility or arterial bruits, normal bowel sounds, no hepatosplenomegaly Extremities: no clubbing, cyanosis or edema; 2+ radial, ulnar and brachial pulses bilaterally; 2+ right femoral, posterior tibial and dorsalis pedis pulses; 2+ left femoral, posterior tibial and dorsalis pedis pulses; no subclavian or femoral bruits Neurological: grossly nonfocal Psych: Normal mood and affect   Wt Readings from Last 3 Encounters:  04/09/17 230 lb (104.3 kg)  02/08/17 225 lb (102.1 kg)  02/08/17 224 lb 8 oz (101.8 kg)      Studies/Labs Reviewed:   EKG:  EKG is ordered today.  The ekg ordered today demonstrates mild sinus bradycardia,  mild first-degree AV block 220 ms, left ventricular hypertrophy with QRS widening 116 ms and very prominent repolarization abnormalities.  QTc 453 ms .  Overall no change from last Recent Labs: 01/01/2017: HGB 15.8; Platelets 174 02/08/2017: ALT 20; BUN 12; Creatinine, Ser 1.01; Potassium 4.3; Sodium 142   Lipid Panel    Component Value Date/Time   CHOL 131 02/08/2017 1100   TRIG 213 (H) 02/08/2017 1100   HDL 27 (L) 02/08/2017 1100   CHOLHDL 4.9 02/08/2017 1100   CHOLHDL 5.8 (H) 03/10/2016 0830   VLDL 41 (H) 03/10/2016 0830   LDLCALC 61 02/08/2017 1100    ASSESSMENT:    1. Paroxysmal atrial fibrillation (HCC)   2. Long term current use of anticoagulant   3. Coronary artery disease of native artery of native heart with stable angina pectoris ([place])   4. Mild aortic stenosis   5. Mixed hyperlipidemia   6. Type 2 diabetes mellitus without complication, without long-term current use of insulin (HCC)   7. Smoking   8. Bilateral carotid bruits   9. Obstructive sleep apnea   10. Moderate obesity   11. CLL (chronic lymphocytic leukemia) ([place])   12. Essential hypertension      PLAN:  In order of problems listed above:  1. AFib: No clinically symptomatic events in the last year. CHADSVasc 4 (age, HTN, CAD, DM). 2. Warfarin: Well-tolerated, no bleeding complications.  Could not afford direct oral anticoagulants.  I believe he can just interrupt warfarin without "bridging" for his colonoscopy, we would give him similar instructions for prostate biopsy should this be necessary 3. CAD: CCS class I exertional angina on beta-blocker and calcium channel blocker, he has not required nitroglycerin in a very long time. 4. AS: Mild, not yet hemodynamically relevant 5. HLP: Most recent lipid profile shows excellent LDL cholesterol, but stubbornly low HDL and mildly elevated triglycerides, consistent with a pattern of insulin resistance/small dense LDL atherogenic profile.  I do not think it makes  sense to change his lipid-lowering medications.  The issue is reversing his obesity and sedentary lifestyle. 6. DM:  Recent excellent hemoglobin A1c on metformin monotherapy.  I am confident that his diabetes could be "cured" with weight loss" 7. Smoking: He continues to smoke 12 cigarettes a day.  He does not think that he can quit.  He is proud to report that he has been sober from alcohol and drugs for 17 years and still goes to [employer]. 8. Carotid bruits: Duplex study has shown that his carotid bruits are related to external carotid artery stenoses.  He only has mild plaque in the internal carotids 9. OSA: Intolerant to CPAP.  He does not want to try this again. 10. Obesity: I think he needs to make more time to pay attention to his own health and he is to eat a healthier diet.  Is clearly doing good job with diabetes control, but his risk factors are not fully addressed.  She also needs a backup plan for his father.  I suggested he contact [facility]. 11. CLL:  Not requiring treatment. No anemia or thrombocytopenia.  Followed by Dr. Alvy Bimler. 12. HTN: Well treated.    Medication Adjustments/Labs and Tests Ordered: Current medicines are reviewed at length with the patient today.  Concerns regarding medicines are outlined above.  Medication changes, Labs and Tests ordered today are listed in the Patient Instructions below. Patient Instructions  Dr Sallyanne Kuster recommends that you schedule a follow-up appointment in 12 months. You will receive a reminder letter in the mail two months in advance. If you don't receive a letter, please call our office to schedule the follow-up appointment.  If you need a refill on your cardiac medications before your next appointment, please call your pharmacy.    Signed, Sanda Klein, MD  04/09/2017 4:18 PM    [facility] [address], [place], [place]  [address] Phone: [phone]; Fax: [phone]

## 2017-04-10 ENCOUNTER — Ambulatory Visit (INDEPENDENT_AMBULATORY_CARE_PROVIDER_SITE_OTHER): Payer: Medicare Other | Admitting: Gastroenterology

## 2017-04-10 ENCOUNTER — Encounter: Payer: Self-pay | Admitting: Gastroenterology

## 2017-04-10 VITALS — BP 136/70 | HR 68 | Ht 68.75 in | Wt 228.4 lb

## 2017-04-10 DIAGNOSIS — I25709 Atherosclerosis of coronary artery bypass graft(s), unspecified, with unspecified angina pectoris: Secondary | ICD-10-CM

## 2017-04-10 DIAGNOSIS — Z86010 Personal history of colonic polyps: Secondary | ICD-10-CM | POA: Diagnosis not present

## 2017-04-10 DIAGNOSIS — Z7901 Long term (current) use of anticoagulants: Secondary | ICD-10-CM

## 2017-04-10 DIAGNOSIS — I480 Paroxysmal atrial fibrillation: Secondary | ICD-10-CM

## 2017-04-10 NOTE — Patient Instructions (Signed)
If you are age 69 or older, your body mass index should be between 23-30. Your Body mass index is 33.97 kg/m. If this is out of the aforementioned range listed, please consider follow up with your Primary Care Provider.  If you are age 59 or younger, your body mass index should be between 19-25. Your Body mass index is 33.97 kg/m. If this is out of the aformentioned range listed, please consider follow up with your Primary Care Provider.   You will be due for a recall colonoscopy in 08-2017. We will send you a reminder in the mail when it gets closer to that time.  Thank you for choosing [facility]  Dr Wilfrid Lund III

## 2017-04-10 NOTE — Progress Notes (Signed)
[facility] Consult Note:  History: Rodney Friedman 04/10/2017  Referring physician: Wendie Agreste, MD  Reason for consult/chief complaint: Family  Hx Of Colon Cancer (Mother) and hx of colon polyps (colon cancer sreening)   Subjective  HPI:  Rally is self-referred to see me for history of colon polyps. He moved here several years ago from [place], and brings his most recent colonoscopy and pathology report. Those findings are noted below. He denies chronic abdominal pain, constipation or rectal bleeding. He is concerned about keeping up with his lung cancer screening because he cares for his elderly father and his daughter's children.   ROS:  Review of Systems  Constitutional: Negative for appetite change and unexpected weight change.  HENT: Negative for mouth sores and voice change.   Eyes: Negative for pain and redness.  Respiratory: Negative for cough and shortness of breath.   Cardiovascular: Negative for chest pain and palpitations.  Genitourinary: Negative for dysuria and hematuria.  Musculoskeletal: Negative for arthralgias and myalgias.  Skin: Negative for pallor and rash.  Neurological: Negative for weakness and headaches.  Hematological: Negative for adenopathy.  Psychiatric/Behavioral: Positive for dysphoric mood.   History of bipolar disorder  Past Medical History: Past Medical History:  Diagnosis Date  . Adenomatous colon polyp   . Alcoholism ([place])   . Anxiety   . Atrial fibrillation ([place])   . CAD (coronary artery disease)   . Cataract   . CLL (chronic lymphocytic leukemia) ([place]) 07/08/2013  . Depression   . Diabetes mellitus without complication ([place])   . Diverticulosis   . Eye abnormality    Macular scarring R eye  . Glaucoma   . Heart murmur   . HLD (hyperlipidemia)   . Hypertension   . Leukemia ([place])    CLL  . Myocardial infarction ([place])   . Prostate cancer ([place]) 07/08/2013  . Sleep apnea   . Substance abuse ([place])     . Tremor, essential 09/22/2015   I reviewed his cardiologist's note from yesterday, who indicated stability of his cardiac condition and no need for bridging Lovenox if patient is off Coumadin for a colonoscopy.  Past Surgical History: Past Surgical History:  Procedure Laterality Date  . Arm Surgery Right    from door accident with glass  . CATARACT EXTRACTION W/ INTRAOCULAR LENS IMPLANT Left   . COLONOSCOPY    . CORONARY ARTERY BYPASS GRAFT  08/04/2004     Family History: Family History  Problem Relation Age of Onset  . Colon cancer Mother   . Ovarian cancer Mother   . Uterine cancer Mother   . Hypertension Father   . Prostate cancer Father     Social History: Social History   Socioeconomic History  . Marital status: Divorced    Spouse name: None  . Number of children: 2  . Years of education: 59  . Highest education level: None  Social Needs  . Financial resource strain: None  . Food insecurity - worry: None  . Food insecurity - inability: None  . Transportation needs - medical: None  . Transportation needs - non-medical: None  Occupational History  . Occupation: Retired Pharmacist, hospital  Tobacco Use  . Smoking status: Current Every Day Smoker    Packs/day: 0.75    Years: 50.00    Pack years: 37.50    Types: Cigarettes  . Smokeless tobacco: Never Used  Substance and Sexual Activity  . Alcohol use: No    Alcohol/week: 0.0 oz  . Drug  use: No  . Sexual activity: Not Currently  Other Topics Concern  . None  Social History Narrative   Lives at home w/ his father   Divorced   Right-handed   Education: College   No caffeine    Allergies: Allergies  Allergen Reactions  . Other Rash    Allergen: "Plants and bushes while doing yard work"    Outpatient Meds: Current Outpatient Medications  Medication Sig Dispense Refill  . amLODipine (NORVASC) 10 MG tablet Take 1 tablet (10 mg total) by mouth daily. 90 tablet 1  . B Complex Vitamins (VITAMIN-B COMPLEX PO) Take  by mouth.    Marland Kitchen BIOTIN PO Take by mouth.    Marland Kitchen buPROPion (WELLBUTRIN XL) 150 MG 24 hr tablet 1  qam 90 tablet 2  . Calcium Carbonate-Vit D-Min (CALCIUM 1200 PO) Take 1,200 mg by mouth daily.    Mariane Baumgarten Calcium (STOOL SOFTENER PO) Take 1 tablet as needed by mouth.    . latanoprost (XALATAN) 0.005 % ophthalmic solution Once a day every second day    . LYCOPENE PO Take 8 mg by mouth daily.    . metFORMIN (GLUCOPHAGE) 500 MG tablet Take 1 tablet (500 mg total) by mouth every evening. 90 tablet 1  . metoprolol succinate (TOPROL-XL) 100 MG 24 hr tablet Take 1 tablet (100 mg total) by mouth daily. Take with or immediately following a meal. 90 tablet 1  . Omega-3 Fatty Acids (FISH OIL) 1000 MG CAPS Take 1,000 mg by mouth daily.    . rosuvastatin (CRESTOR) 20 MG tablet Take 1 tablet (20 mg total) by mouth daily. 90 tablet 1  . valsartan (DIOVAN) 320 MG tablet TAKE 1 TABLET BY MOUTH EVERY DAY*PT DUE FOR OFFICE VISIT 30 tablet 0  . vitamin B-12 (CYANOCOBALAMIN) 500 MCG tablet Take 500 mcg by mouth daily.    . vitamin C (ASCORBIC ACID) 500 MG tablet Take 500 mg by mouth daily.    Marland Kitchen warfarin (COUMADIN) 4 MG tablet TAKE 1 TO 1 & 1/2 TABLETS BY MOUTH EVERY DAY AS DIRECTED BY COUMADIN CLINIC 120 tablet 0   No current facility-administered medications for this visit.       ___________________________________________________________________ Objective   Exam:  BP 136/70 (BP Location: Left Arm, Patient Position: Sitting)   Pulse 68   Ht 5' 8.75" (1.746 m) Comment: height measured without shoes  Wt 228 lb 6 oz (103.6 kg)   BMI 33.97 kg/m    General: this is a(n) pleasant man with a resting tremor of his hands   Eyes: sclera anicteric, no redness  ENT: oral mucosa moist without lesions, no cervical or supraclavicular lymphadenopathy, good dentition  CV: RRR without murmur, S1/S2, no JVD, no peripheral edema  Resp: clear to auscultation bilaterally, normal RR and effort noted  GI: soft,  overweight, no tenderness, with active bowel sounds. No guarding or palpable organomegaly noted.  Skin; warm and dry, no rash or jaundice noted  Neuro: awake, alert and oriented x 3. Normal gross motor function and fluent speech  Labs:  Colonoscopy April 2014 complete to the cecum with excellent prep. Several polyps reportedly removed. Pathology report indicates 2 hyperplastic polyps, each 3 mm, and a 3 mm tubular adenoma in the transverse colon.  Assessment: Encounter Diagnoses  Name Primary?  . Personal history of colonic polyps Yes  . Paroxysmal atrial fibrillation (HCC)   . Current use of long term anticoagulation     By the current guidelines, Kalik is due for a  colonoscopy in April 2019. I explained the current guidelines to him, and he is agreeable to see [place] at that time. If put him in for the appropriate recall, and I do not think he will need an office appointment with me then unless he has a significant change in his health in the interim. We would have him stop Coumadin 4 days prior to procedure with no need for bridging Lovenox. When his recall comes up, I will certainly do a chart review to ensure stability of his medical condition.  Total 20 minute time face-to-face, over half spent reviewing records, counseling and coordinating care.  Thank you for the courtesy of this consult.  Please call me with any questions or concerns.  Nelida Meuse III  CC: Wendie Agreste, MD

## 2017-04-11 ENCOUNTER — Ambulatory Visit (INDEPENDENT_AMBULATORY_CARE_PROVIDER_SITE_OTHER): Payer: Medicare Other | Admitting: Licensed Clinical Social Worker

## 2017-04-11 ENCOUNTER — Encounter: Payer: Self-pay | Admitting: Family Medicine

## 2017-04-11 ENCOUNTER — Encounter (HOSPITAL_COMMUNITY): Payer: Self-pay | Admitting: Licensed Clinical Social Worker

## 2017-04-11 ENCOUNTER — Ambulatory Visit (HOSPITAL_COMMUNITY): Payer: Self-pay | Admitting: Licensed Clinical Social Worker

## 2017-04-11 DIAGNOSIS — F3130 Bipolar disorder, current episode depressed, mild or moderate severity, unspecified: Secondary | ICD-10-CM | POA: Diagnosis not present

## 2017-04-11 NOTE — Progress Notes (Signed)
   THERAPIST PROGRESS NOTE  Session Time: 9:00am-10:00am  Participation Level: Active  Behavioral Response: Well GroomedAlertIrritable  Type of Therapy: Individual Therapy  Treatment Goals addressed: Improve psychiatric symptoms, elevate mood (decreased irritability, increased enjoyment of activities), Improve unhelpful thought patterns, emotional regulation skills (reduce temper outburst), Interpersonal relationship skills  Interventions: Motivational Interviewing and Other: Grounding and Mindfulness techniques  Summary: Adem Costlow is a 71 y.o. male who presents with Bipolar 1 Disorder, Most recent episode depressed  Suicidal/Homicidal: No without intent/plan  Therapist Response:  [name] met with clinician for individual therapy. Denario discussed his psychiatric symptoms and current life events. Kuper shared ongoing concerns about his children. Demetrius processed recent news that his daughter is in jail in [place]. She has multiple drug charges and is currently being held without bail. Clinician utilized [place] OARS to reflect and summarize thoughts and feelings. Clinician identified sadness, hurt, and anger about daughter's choices, as well as disappointment about her addictions. Hommer reports his father recently had a fall and it kept him from going out and being involved with his NA meeting and also something at church. Wylder reports this is his responsibility and he takes it seriously. However, he reports he cannot miss more NA meetings because they are very helpful and part of his routine. Riley also processed some concerns about his son. Loyce has been working closely with doctors to ensure that he "stays alive". Clinician reflected the importance and responsibility he takes for his family.   Plan: Return again in 1-2 weeks.  Diagnosis:     Axis I: Bipolar 1 Disorder, Most recent episode depressed  [name], LCSW 04/11/2017

## 2017-04-18 ENCOUNTER — Encounter (HOSPITAL_COMMUNITY): Payer: Self-pay | Admitting: Licensed Clinical Social Worker

## 2017-04-18 ENCOUNTER — Ambulatory Visit (INDEPENDENT_AMBULATORY_CARE_PROVIDER_SITE_OTHER): Payer: Medicare Other | Admitting: Licensed Clinical Social Worker

## 2017-04-18 DIAGNOSIS — F3130 Bipolar disorder, current episode depressed, mild or moderate severity, unspecified: Secondary | ICD-10-CM

## 2017-04-18 NOTE — Progress Notes (Signed)
   THERAPIST PROGRESS NOTE  Session Time: 9:05-10:00am  Participation Level: Active  Behavioral Response: Well GroomedAlertIrritable   Type of Therapy: Individual Therapy  Treatment Goals addressed: Improve psychiatric symptoms, elevate mood (decreased irritability, increased enjoyment of activities), Improve unhelpful thought patterns, emotional regulation skills (reduce temper outburst), Interpersonal relationship skills  Interventions: Motivational Interviewing and Other: Grounding and Mindfulness techniques  Summary: Rodney Friedman is a 71 y.o. male who presents with Bipolar 1 Disorder, Most recent episode depressed  Suicidal/Homicidal: No without intent/plan  Therapist Response:  Rodney Friedman met with Rodney Friedman for individual therapy. Rodney Friedman discussed his psychiatric symptoms and current life events. Rodney Friedman shared ongoing concerns about father's health, his own health, his children and grandchildren. Rodney Friedman noted keeping himself busy with managing his finances, contacting Social Security, [place] retirement, Social research officer, government. He also noted frustration that he has been forgetting and getting somewhat confused lately. Rodney Friedman continues to get his affairs in order and plans ahead so that when he dies, his estate will be in order. Rodney Friedman explored Rodney Friedman's social interactions and noted the importance of doing something out of the house with friends. Rodney Friedman reports he is active in church, as well as attending [employer]. He is starting to come to grips with his mortality and "being old".   Plan: Return again in 1-2 weeks.  Diagnosis:     Axis I: Bipolar 1 Disorder, Most recent episode depressed  [name], LCSW 04/18/2017

## 2017-04-25 ENCOUNTER — Ambulatory Visit (INDEPENDENT_AMBULATORY_CARE_PROVIDER_SITE_OTHER): Payer: Medicare Other | Admitting: Licensed Clinical Social Worker

## 2017-04-25 ENCOUNTER — Encounter (HOSPITAL_COMMUNITY): Payer: Self-pay | Admitting: Licensed Clinical Social Worker

## 2017-04-25 DIAGNOSIS — F3130 Bipolar disorder, current episode depressed, mild or moderate severity, unspecified: Secondary | ICD-10-CM | POA: Diagnosis not present

## 2017-04-25 NOTE — Progress Notes (Signed)
   THERAPIST PROGRESS NOTE  Session Time: 9:00am-10:00am  Participation Level: Active  Behavioral Response: NeatAlertDepressed  Type of Therapy: Individual Therapy  Treatment Goals addressed: Improve psychiatric symptoms, elevate mood (decreased irritability, increased enjoyment of activities), Improve unhelpful thought patterns, emotional regulation skills (reduce temper outburst), Interpersonal relationship skills  Interventions: Motivational Interviewing and Other: Grounding and Mindfulness techniques  Summary: Melvern Ramone is a 71 y.o. male who presents with Bipolar 1 Disorder, Most recent episode depressed  Suicidal/Homicidal: No without intent/plan  Therapist Response:  [name] met with clinician for individual therapy. Marquest discussed his psychiatric symptoms and current life events. Alok shared a sense of melancholy this week. He reported that nothing is going wrong, but he has been feeling more lonely. Corban processed his previous marriages and his experiences in longer term relationships. Clinician explored challenges with being in a relationship and the challenges of not being in a relationship. Clinician also reflected the freedom of being able to go and do whatever he wants to do without being responsible for anyone else. Yesenia reported feeling confident in the case management tasks he has completed, with the mortgage, will, etc. However,he noted that it is a lot for one person to handle on his own. Graviel discussed recent interactions with daughter over the phone, who is currently incarcerated. Clinician assisted in processing and challenging some of the thoughts about his daughter. Clinician discussed choices and attitude about his choices (have to's and want to's).   Plan: Return again in 1-2 weeks.  Diagnosis:     Axis I: Bipolar 1 Disorder, Most recent episode depressed  [name], LCSW 04/25/2017

## 2017-05-02 ENCOUNTER — Ambulatory Visit (HOSPITAL_COMMUNITY): Payer: Self-pay | Admitting: Licensed Clinical Social Worker

## 2017-05-09 ENCOUNTER — Ambulatory Visit (INDEPENDENT_AMBULATORY_CARE_PROVIDER_SITE_OTHER): Payer: Medicare Other | Admitting: Licensed Clinical Social Worker

## 2017-05-09 ENCOUNTER — Encounter (HOSPITAL_COMMUNITY): Payer: Self-pay | Admitting: Licensed Clinical Social Worker

## 2017-05-09 DIAGNOSIS — F3130 Bipolar disorder, current episode depressed, mild or moderate severity, unspecified: Secondary | ICD-10-CM

## 2017-05-09 NOTE — Progress Notes (Signed)
   THERAPIST PROGRESS NOTE  Session Time: 9:15am-10:15am  Participation Level: Active  Behavioral Response: CasualAlertIrritable  Type of Therapy: Individual Therapy  Treatment Goals addressed: Improve psychiatric symptoms, elevate mood (decreased irritability, increased enjoyment of activities), Improve unhelpful thought patterns, emotional regulation skills (reduce temper outburst), Interpersonal relationship skills  Interventions: Motivational Interviewing and Other: Grounding and Mindfulness techniques  Summary: Woodward Klem is a 71 y.o. male who presents with Bipolar 1 Disorder, Most recent episode depressed  Suicidal/Homicidal: No without intent/plan  Therapist Response:  [name] met with clinician for individual therapy. Trevel discussed his psychiatric symptoms and current life events. Jaquarius shared several frustrations with his family members, including the woman who has adopted his grandchildren for not returning calls, daughter who is going into rehab, and worries about friends who have been diagnosed with cancer. Kerrick processed thoughts and feelings about each of these issues and clinician assisted in sorting and recognizing what he can control and what he cannot. Corderro processed concerns about his friends in relation to his own feelings about mortality. He also noted that some of his feelings were selfish due to possibly not having "emergency contacts" locally if they die.  [name] and clinician discussed medication and noted that even if he feels he doesn't need it, he needs to continue because that means the medication is working. Iyad agreed and noted that he will see Dr. Casimiro Needle in Feb. He understands the usefulness of wrap-around supports including meds, therapy, AA/NA, church, etc.   Plan: Return again in 1-2 weeks.  Diagnosis:     Axis I: Bipolar 1 Disorder, Most recent episode depressed  [name], LCSW 05/09/2017

## 2017-05-11 ENCOUNTER — Ambulatory Visit (INDEPENDENT_AMBULATORY_CARE_PROVIDER_SITE_OTHER): Payer: Medicare Other | Admitting: Pharmacist Clinician (PhC)/ Clinical Pharmacy Specialist

## 2017-05-11 DIAGNOSIS — I482 Chronic atrial fibrillation, unspecified: Secondary | ICD-10-CM

## 2017-05-11 DIAGNOSIS — Z7901 Long term (current) use of anticoagulants: Secondary | ICD-10-CM | POA: Diagnosis not present

## 2017-05-11 DIAGNOSIS — I480 Paroxysmal atrial fibrillation: Secondary | ICD-10-CM | POA: Diagnosis not present

## 2017-05-11 LAB — POCT INR: INR: 2.6

## 2017-05-11 NOTE — Patient Instructions (Signed)
Description   Continue with 1 tablet daily. Repeat INR in  6 weeks      

## 2017-05-18 ENCOUNTER — Encounter: Payer: Self-pay | Admitting: Family Medicine

## 2017-05-23 ENCOUNTER — Encounter (HOSPITAL_COMMUNITY): Payer: Self-pay | Admitting: Licensed Clinical Social Worker

## 2017-05-23 ENCOUNTER — Ambulatory Visit (INDEPENDENT_AMBULATORY_CARE_PROVIDER_SITE_OTHER): Payer: Medicare Other | Admitting: Licensed Clinical Social Worker

## 2017-05-23 DIAGNOSIS — F3130 Bipolar disorder, current episode depressed, mild or moderate severity, unspecified: Secondary | ICD-10-CM

## 2017-05-23 NOTE — Progress Notes (Signed)
   THERAPIST PROGRESS NOTE  Session Time: 9:00am-10:00am  Participation Level: Active  Behavioral Response: Well GroomedAlertIrritable  Type of Therapy: Individual Therapy  Treatment Goals addressed: Improve psychiatric symptoms, elevate mood (decreased irritability, increased enjoyment of activities), Improve unhelpful thought patterns, emotional regulation skills (reduce temper outburst), Interpersonal relationship skills  Interventions: Motivational Interviewing and Other: Grounding and Mindfulness techniques  Summary: Rodney Friedman is a 72 y.o. male who presents with Bipolar 1 Disorder, Most recent episode depressed  Suicidal/Homicidal: No without intent/plan  Therapist Response:  Rodney Friedman met with clinician for individual therapy. Rodney Friedman discussed his psychiatric symptoms and current life events. Rodney Friedman shared ongoing frustration that he has not heard from his grandchildren in [place] in several weeks. Rodney Friedman vented his feelings and clinician reflected using MI OARS. Clinician identified the importance of not allowing catastrophic thinking to take over. Clinician also processed the transition from anger to frustration, to worry, to disgust that happens. Rodney Friedman continued to process his feelings about his friends being diagnosed with cancer, as well as concerns about his own mortality. Clinician continued to explore and note Rodney Friedman's progress toward maintaining his own health and being proactive about communicating with doctors. Rodney Friedman discussed the importance of his doctors working as a team, especially his primary care and his prostate doctors. Clinician encouraged Rodney Friedman to communicate that with his doctors, as well as work as Tourist information centre manager to ensure that they are working together.   Plan: Return again in 1-2 weeks.  Diagnosis:     Axis I: Bipolar 1 Disorder, Most recent episode depressed  [name], LCSW 05/23/2017

## 2017-05-24 DIAGNOSIS — C61 Malignant neoplasm of prostate: Secondary | ICD-10-CM | POA: Diagnosis not present

## 2017-05-30 ENCOUNTER — Other Ambulatory Visit (HOSPITAL_COMMUNITY): Payer: Self-pay | Admitting: Urology

## 2017-05-30 ENCOUNTER — Ambulatory Visit (HOSPITAL_COMMUNITY): Payer: Self-pay | Admitting: Licensed Clinical Social Worker

## 2017-05-30 ENCOUNTER — Ambulatory Visit (HOSPITAL_COMMUNITY): Payer: Medicare Other

## 2017-05-30 DIAGNOSIS — R9721 Rising PSA following treatment for malignant neoplasm of prostate: Secondary | ICD-10-CM | POA: Diagnosis not present

## 2017-05-30 DIAGNOSIS — Z8546 Personal history of malignant neoplasm of prostate: Secondary | ICD-10-CM | POA: Diagnosis not present

## 2017-05-30 DIAGNOSIS — C61 Malignant neoplasm of prostate: Secondary | ICD-10-CM

## 2017-06-06 ENCOUNTER — Ambulatory Visit (INDEPENDENT_AMBULATORY_CARE_PROVIDER_SITE_OTHER): Payer: Medicare Other | Admitting: Licensed Clinical Social Worker

## 2017-06-06 ENCOUNTER — Encounter (HOSPITAL_COMMUNITY): Payer: Self-pay | Admitting: Licensed Clinical Social Worker

## 2017-06-06 DIAGNOSIS — F3130 Bipolar disorder, current episode depressed, mild or moderate severity, unspecified: Secondary | ICD-10-CM

## 2017-06-06 NOTE — Progress Notes (Signed)
THERAPIST PROGRESS NOTE  Session Time: 9:05am-10:00am  Participation Level: Active  Behavioral Response: Well GroomedAlertIrritable  Type of Therapy: Individual Therapy  Treatment Goals addressed: Improve psychiatric symptoms, elevate mood (decreased irritability, increased enjoyment of activities), Improve unhelpful thought patterns, emotional regulation skills (reduce temper outburst), Interpersonal relationship skills  Interventions: Motivational Interviewing and Other: Grounding and Mindfulness techniques  Summary: Rodney Friedman is a 72 y.o. male who presents with Bipolar 1 Disorder, Most recent episode depressed  Suicidal/Homicidal: No without intent/plan  Therapist Response:  [name] met with clinician for individual therapy. [name] discussed his psychiatric symptoms and current life events. [name] shared that he finally heard from the caregiver of his grandchildren in [place]. He identified ongoing frustration because the caregiver is not telling him everything and he feels she is not as active in completing logistical tasks in completing the adoption, ensuring stable housing, etc. [name] reports he went for a follow up appointment and confirmed that his prostate numbers continue to increase and he will have a PET scan in early Feb. [name] identified increased anxiety due to waiting, as well as concerns about his health and lifespan. Clinician utilized MI OARS to reflect and summarize thoughts and feelings. Clinician also identified the importance of not focusing on the fear of the unknown and caring for himself.   Plan: Return again in 1-2 weeks.  Diagnosis:     Axis I: Bipolar 1 Disorder, Most recent episode depressed  [name], LCSW 06/06/2017

## 2017-06-13 ENCOUNTER — Ambulatory Visit (INDEPENDENT_AMBULATORY_CARE_PROVIDER_SITE_OTHER): Payer: Medicare Other | Admitting: Licensed Clinical Social Worker

## 2017-06-13 ENCOUNTER — Encounter (HOSPITAL_COMMUNITY): Payer: Self-pay | Admitting: Licensed Clinical Social Worker

## 2017-06-13 DIAGNOSIS — F3130 Bipolar disorder, current episode depressed, mild or moderate severity, unspecified: Secondary | ICD-10-CM

## 2017-06-13 NOTE — Progress Notes (Signed)
   THERAPIST PROGRESS NOTE  Session Time: 9:00am-10:00am  Participation Level: Active  Behavioral Response: NeatAlertIrritable  Type of Therapy: Individual Therapy  Treatment Goals addressed: Improve psychiatric symptoms, elevate mood (decreased irritability, increased enjoyment of activities), Improve unhelpful thought patterns, emotional regulation skills (reduce temper outburst), Interpersonal relationship skills  Interventions: Motivational Interviewing and Other: Grounding and Mindfulness techniques  Summary: Rodney Friedman is a 72 y.o. male who presents with Bipolar 1 Disorder, Most recent episode depressed  Suicidal/Homicidal: No without intent/plan  Therapist Response:  [name] met with clinician for individual therapy. Rodney Friedman discussed his psychiatric symptoms and current life events. Rodney Friedman shared ongoing concerns about his friends who are diagnosed with terminal cancer. Rodney Friedman also reports he feels anxiety due to his own upcoming PET scan. Khasir discussed plans to take a trip to [facility] with his father and consulted about logistical issues. Clinician provided resources for safe travel supports. Rodney Friedman also processed ongoing progress in his own case management: will, getting house in his name, ensuring that will specifies that house goes to [place] and car goes to [place]. Rodney Friedman began discussion about end of life experiences with his mother. Clinician identified stages of grief, as well as some changes in personality of the person who is dying.   Plan: Return again in 1-2 weeks.  Diagnosis:     Axis I: Bipolar 1 Disorder, Most recent episode depressed  [name], LCSW 06/13/2017

## 2017-06-18 ENCOUNTER — Ambulatory Visit (INDEPENDENT_AMBULATORY_CARE_PROVIDER_SITE_OTHER): Payer: Medicare Other | Admitting: Family Medicine

## 2017-06-18 ENCOUNTER — Encounter: Payer: Self-pay | Admitting: Family Medicine

## 2017-06-18 ENCOUNTER — Other Ambulatory Visit: Payer: Self-pay

## 2017-06-18 VITALS — BP 132/60 | HR 66 | Temp 98.7°F | Resp 18 | Ht 68.75 in | Wt 227.2 lb

## 2017-06-18 DIAGNOSIS — Z122 Encounter for screening for malignant neoplasm of respiratory organs: Secondary | ICD-10-CM

## 2017-06-18 DIAGNOSIS — Z136 Encounter for screening for cardiovascular disorders: Secondary | ICD-10-CM | POA: Diagnosis not present

## 2017-06-18 DIAGNOSIS — Z720 Tobacco use: Secondary | ICD-10-CM

## 2017-06-18 DIAGNOSIS — E785 Hyperlipidemia, unspecified: Secondary | ICD-10-CM

## 2017-06-18 DIAGNOSIS — E119 Type 2 diabetes mellitus without complications: Secondary | ICD-10-CM | POA: Diagnosis not present

## 2017-06-18 DIAGNOSIS — L219 Seborrheic dermatitis, unspecified: Secondary | ICD-10-CM

## 2017-06-18 DIAGNOSIS — C9190 Lymphoid leukemia, unspecified not having achieved remission: Secondary | ICD-10-CM

## 2017-06-18 DIAGNOSIS — I10 Essential (primary) hypertension: Secondary | ICD-10-CM | POA: Diagnosis not present

## 2017-06-18 DIAGNOSIS — C9110 Chronic lymphocytic leukemia of B-cell type not having achieved remission: Secondary | ICD-10-CM

## 2017-06-18 LAB — CBC
HEMATOCRIT: 46.5 % (ref 37.5–51.0)
Hemoglobin: 15.7 g/dL (ref 13.0–17.7)
MCH: 31 pg (ref 26.6–33.0)
MCHC: 33.8 g/dL (ref 31.5–35.7)
MCV: 92 fL (ref 79–97)
PLATELETS: 199 10*3/uL (ref 150–379)
RBC: 5.06 x10E6/uL (ref 4.14–5.80)
RDW: 14.3 % (ref 12.3–15.4)
WBC: 16.9 10*3/uL — ABNORMAL HIGH (ref 3.4–10.8)

## 2017-06-18 LAB — HEMOGLOBIN A1C
Est. average glucose Bld gHb Est-mCnc: 143 mg/dL
Hgb A1c MFr Bld: 6.6 % — ABNORMAL HIGH (ref 4.8–5.6)

## 2017-06-18 MED ORDER — METFORMIN HCL 500 MG PO TABS: 500.0000 mg | ORAL_TABLET | Freq: Every evening | ORAL | 1 refills | Status: DC

## 2017-06-18 MED ORDER — VALSARTAN 320 MG PO TABS: ORAL_TABLET | ORAL | 1 refills | Status: DC

## 2017-06-18 MED ORDER — ROSUVASTATIN CALCIUM 20 MG PO TABS: 20.0000 mg | ORAL_TABLET | Freq: Every day | ORAL | 1 refills | Status: DC

## 2017-06-18 MED ORDER — AMLODIPINE BESYLATE 10 MG PO TABS: 10.0000 mg | ORAL_TABLET | Freq: Every day | ORAL | 1 refills | Status: DC

## 2017-06-18 MED ORDER — METOPROLOL SUCCINATE ER 100 MG PO TB24: 100.0000 mg | ORAL_TABLET | Freq: Every day | ORAL | 1 refills | Status: DC

## 2017-06-18 MED ORDER — KETOCONAZOLE 2 % EX SHAM: 1.0000 "application " | MEDICATED_SHAMPOO | CUTANEOUS | 1 refills | Status: DC

## 2017-06-18 NOTE — Progress Notes (Signed)
Subjective:  By signing my name below, I, Essence Howell, attest that this documentation has been prepared under the direction and in the presence of Wendie Agreste, MD Electronically Signed: Ladene Artist, ED Scribe 06/18/2017 at 8:43 AM.   Patient ID: Rodney Friedman, male    DOB: 10-31-1945, 72 y.o.   MRN: [id number]  Chief Complaint  Patient presents with  . bloodwork    psa, a1c, cholesterol    HPI Rodney Friedman is a 72 y.o. male who presents to Primary Care at [facility] for f/u.  Elevated PSA Evaluated by Dr. Karsten Ro. Possibility of prostate CA recurrence. Seen 1/9. Planned for repeat PSA in 3 months, April, then possible repeat biopsy, followed by PET scan if neg. -- Pt reports that his last PSA was 7.5 when seen by urology last month. His PET scan is scheduled for 2/1. Denies urinary retention, hematuria.  Health Maintenance  Planned on colonoscopy in April. Pt has not had an abdominal US for AAA screening or CAT scan for lung CA screening. He currently smokes ~12 cigarettes/day x 50 yrs.  A-fib Cardiology: Dr. Sallyanne Kuster. OV in Nov. Takes Coumadin. Class 1 angina with CAD. On betablocker CCB. Denies cp or recent angina, sob, lightheadedness, dizziness.  CLL Followed by Dr. Alvy Bimler. Planned on observation, yearly f/u with the next appointment in Aug. Pt reports WBC usually hovers around 15,000-16,000.  Lab Results  Component Value Date   WBC 17.3 (H) 01/01/2017   HGB 15.8 01/01/2017   HCT 46.4 01/01/2017   MCV 91.8 01/01/2017   PLT 174 01/01/2017   Hyperlipidemia Lab Results  Component Value Date   CHOL 131 02/08/2017   HDL 27 (L) 02/08/2017   LDLCALC 61 02/08/2017   TRIG 213 (H) 02/08/2017   CHOLHDL 4.9 02/08/2017   Lab Results  Component Value Date   ALT 20 02/08/2017   AST 19 02/08/2017   ALKPHOS 37 (L) 02/08/2017   BILITOT 0.3 02/08/2017  Crestor 20 mg qd. Denies side-effects.  DM Metformin 500 mg qd. Follows up with optho in Feb. No h/o retinopathy.  Has not been exercising as much as he would like but reports doing a lot of raking outside. Lab Results  Component Value Date   HGBA1C 6.5 (H) 02/15/2017  Had an urine microalbumin in Sept.  Seborrheic Dermatitis  Pt requests a refill ketoconazole shampoo which was previously prescribed by a dermatologist. He treats his scalp ~3 times/wk.  Patient Active Problem List   Diagnosis Date Noted  . Paroxysmal atrial fibrillation ([place]) 04/21/2016  . Diabetes mellitus ([place]) 04/21/2016  . OSA (obstructive sleep apnea) 04/21/2016  . Quality of life palliative care encounter 01/03/2016  . Bilateral carotid bruits without stenosis 01/02/2016  . Tremor, essential 09/22/2015  . Glaucoma, open angle 06/16/2015  . 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  . Pseudoaphakia 06/15/2015  . CD (contact dermatitis) 04/29/2014  . 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 Hematuria 01/05/2014  . Skin lesion 01/05/2014  . Mild aortic valve stenosis 01/03/2014  . Hypertensive heart disease 01/03/2014  . Mixed hyperlipidemia 01/03/2014  . Mild obesity 01/03/2014  . Tobacco abuse 01/03/2014  . Chronic atrial fibrillation ([place]) 12/31/2013  . Long term current use of anticoagulant therapy 12/31/2013  . Cataract, nuclear 10/06/2013  . Dermatochalasis of eyelid 10/06/2013  . 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 CAD (coronary artery disease) 09/03/2013  . Pre-diabetes 09/03/2013  . 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 Prostate cancer ([place]) 07/08/2013   Past Medical History:  Diagnosis Date  . Adenomatous colon polyp   . Alcoholism ([place])   . Anxiety   . Atrial fibrillation ([place])   . CAD (coronary artery disease)   . Cataract   . 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 Depression   . Diabetes mellitus without complication ([place])   . Diverticulosis   . Eye abnormality    Macular scarring R eye  . Glaucoma   . Heart murmur   . HLD (hyperlipidemia)   .  Hypertension   . Leukemia ([place])    CLL  . Myocardial infarction ([place])   . Prostate cancer ([place]) 07/08/2013  . Sleep apnea   . Substance abuse ([place])   . 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 Arm Surgery Right    from door accident with glass  . CATARACT EXTRACTION W/ INTRAOCULAR LENS IMPLANT Left   . COLONOSCOPY    . CORONARY ARTERY BYPASS GRAFT  08/04/2004   Allergies  Allergen Reactions  . Other Rash    Allergen: "Plants and bushes while doing yard work"   Prior to Admission medications   Medication Sig Start Date End Date Taking? Authorizing Provider  amLODipine (NORVASC) 10 MG tablet Take 1 tablet (10 mg total) by mouth daily. 02/08/17  Yes Wendie Agreste, MD  B Complex Vitamins (VITAMIN-B COMPLEX PO) Take by mouth.   Yes [provider]  BIOTIN PO Take by mouth.   Yes [provider]  buPROPion (WELLBUTRIN XL) 150 MG 24 hr tablet 1  qam 01/19/17  Yes Plovsky, Berneta Sages, MD  Calcium Carbonate-Vit D-Min (CALCIUM 1200 PO) Take 1,200 mg by mouth daily.   Yes [provider]  Docusate Calcium (STOOL SOFTENER PO) Take 1 tablet as needed by mouth.   Yes [provider]  latanoprost (XALATAN) 0.005 % ophthalmic solution Once a day every second day 06/30/13  Yes [provider]  LYCOPENE PO Take 8 mg by mouth daily.   Yes [provider]  metFORMIN (GLUCOPHAGE) 500 MG tablet Take 1 tablet (500 mg total) by mouth every evening. 02/08/17  Yes Wendie Agreste, MD  metoprolol succinate (TOPROL-XL) 100 MG 24 hr tablet Take 1 tablet (100 mg total) by mouth daily. Take with or immediately following a meal. 02/08/17  Yes Wendie Agreste, MD  Omega-3 Fatty Acids (FISH OIL) 1000 MG CAPS Take 1,000 mg by mouth daily.   Yes [provider]  rosuvastatin (CRESTOR) 20 MG tablet Take 1 tablet (20 mg total) by mouth daily. 02/08/17  Yes Wendie Agreste, MD  valsartan (DIOVAN) 320 MG tablet TAKE 1  TABLET BY MOUTH EVERY DAY*PT DUE FOR OFFICE VISIT 01/01/17  Yes Heath Lark, MD  vitamin B-12 (CYANOCOBALAMIN) 500 MCG tablet Take 500 mcg by mouth daily.   Yes [provider]  vitamin C (ASCORBIC ACID) 500 MG tablet Take 500 mg by mouth daily.   Yes [provider]  warfarin (COUMADIN) 4 MG tablet TAKE 1 TO 1 & 1/2 TABLETS BY MOUTH EVERY DAY AS DIRECTED BY COUMADIN CLINIC 04/09/17  Yes Croitoru, Mihai, MD   Social History   Socioeconomic History  . Marital status: Divorced    Spouse name: Not on file  . Number of children: 2  . Years of education: 4  . Highest education level: Not on file  Social Needs  . Financial resource strain: Not on file  . Food insecurity - worry: Not on file  . Food  insecurity - inability: Not on file  . Transportation needs - medical: Not on file  . Transportation needs - non-medical: Not on file  Occupational History  . Occupation: Retired Pharmacist, hospital  Tobacco Use  . Smoking status: Current Every Day Smoker    Packs/day: 0.75    Years: 50.00    Pack years: 37.50    Types: Cigarettes  . Smokeless tobacco: Never Used  Substance and Sexual Activity  . Alcohol use: No    Alcohol/week: 0.0 oz  . Drug use: No  . Sexual activity: Not Currently  Other Topics Concern  . Not on file  Social History Narrative   Lives at home w/ his father   Divorced   Right-handed   Education: College   No caffeine   Review of Systems  Respiratory: Negative for shortness of breath.   Cardiovascular: Negative for chest pain.  Genitourinary: Negative for difficulty urinating and hematuria.  Neurological: Negative for dizziness and light-headedness.      Objective:   Physical Exam  Constitutional: He is oriented to person, place, and time. He appears well-developed and well-nourished.  HENT:  Head: Normocephalic and atraumatic.  Eyes: EOM are normal. Pupils are equal, round, and reactive to light.  Neck: No JVD present. Carotid bruit is not present.    Cardiovascular: Normal rate, regular rhythm and normal heart sounds.  No murmur heard. Pulmonary/Chest: Effort normal and breath sounds normal. He has no rales.  Musculoskeletal: He exhibits no edema.  Neurological: He is alert and oriented to person, place, and time.  Skin: Skin is warm and dry.  Scalp appears normal today. H/o dry scalp, probably seborrheic.  Psychiatric: He has a normal mood and affect.  Vitals reviewed.  Vitals:   06/18/17 0809  BP: 132/60  Pulse: 66  Resp: 18  Temp: 98.7 F (37.1 C)  TempSrc: Oral  SpO2: 97%  Weight: 227 lb 3.2 oz (103.1 kg)  Height: 5' 8.75" (1.746 m)      Assessment & Plan:   Rodney Friedman is a 72 y.o. male Type 2 diabetes mellitus without complication, without long-term current use of insulin ([place]) - Plan: Hemoglobin A1c, metFORMIN (GLUCOPHAGE) 500 MG tablet, ketoconazole (NIZORAL) 2 % shampoo  - Controlled previously. Check A1c, continue same dose metformin. Continue routine follow-up with ophthalmology, plan for urine microalbumin later this year. Continue statin, exercise recommended - low to moderate intensity with history of CAD.  Hyperlipidemia, unspecified hyperlipidemia type - Plan: rosuvastatin (CRESTOR) 20 MG tablet  - Tolerating Crestor, previous lipids well controlled with LDL less than 70, ideally in that range with diabetes and history of CAD.  Essential hypertension - Plan: metoprolol succinate (TOPROL-XL) 100 MG 24 hr tablet, valsartan (DIOVAN) 320 MG tablet, amLODipine (NORVASC) 10 MG tablet  - Stable, no med changes. Refilled, recheck 6 months  Tobacco abuse  - Cessation discussed, not ready to quit yet.  Screening for AAA (abdominal aortic aneurysm) - Plan: US Abdomen Limited - single abdominal ultrasound ordered for screening.  Encounter for screening for lung cancer  -Initially ordered low-dose CT, but did not appear to be covered. We'll need to check into coverage for screening  CLL (chronic lymphocytic  leukemia) ([place]) - Plan: CBC  -Repeat CBC, if significant elevation/change from prior range of approximately 15-16,  would recommend discussion with his hematologist.  Seborrheic dermatitis of scalp - Plan: ketoconazole (NIZORAL) 2 % shampoo  - Controlled with intermittent use of ketoconazole. Refilled. Continue follow-up with dermatology as planned  Meds ordered  this encounter  Medications  . rosuvastatin (CRESTOR) 20 MG tablet    Sig: Take 1 tablet (20 mg total) by mouth daily.    Dispense:  90 tablet    Refill:  1  . metoprolol succinate (TOPROL-XL) 100 MG 24 hr tablet    Sig: Take 1 tablet (100 mg total) by mouth daily. Take with or immediately following a meal.    Dispense:  90 tablet    Refill:  1  . metFORMIN (GLUCOPHAGE) 500 MG tablet    Sig: Take 1 tablet (500 mg total) by mouth every evening.    Dispense:  90 tablet    Refill:  1  . valsartan (DIOVAN) 320 MG tablet    Sig: TAKE 1 TABLET BY MOUTH EVERY DAY    Dispense:  90 tablet    Refill:  1  . amLODipine (NORVASC) 10 MG tablet    Sig: Take 1 tablet (10 mg total) by mouth daily.    Dispense:  90 tablet    Refill:  1  . ketoconazole (NIZORAL) 2 % shampoo    Sig: Apply 1 application topically 2 (two) times a week.    Dispense:  120 mL    Refill:  1   Patient Instructions    Walking for exercise, or yardwork (low to moderate intensity exercise) 3-4 days per week.   I will repeat blood count, and if that significantly increased would recommend follow-up with hematology.  I ordered the ultrasound to screen for abdominal aortic aneurysm. Currently I do not see that the CT scan of the lungs would be covered. We'll need to check into that further to see how we can get it covered for lung cancer screening.  Recheck in 6 months if A1c is controlled. Let me know if there are questions sooner. Thanks for coming in today.   IF you received an x-ray today, you will receive an invoice from [facility]. Please contact  [facility] at [phone] with questions or concerns regarding your invoice.   IF you received labwork today, you will receive an invoice from [facility]. Please contact [facility] at [phone] with questions or concerns regarding your invoice.   Our billing staff will not be able to assist you with questions regarding bills from these companies.  You will be contacted with the lab results as soon as they are available. The fastest way to get your results is to activate your My Chart account. Instructions are located on the last page of this paperwork. If you have not heard from [place] regarding the results in 2 weeks, please contact this office.      I personally performed the services described in this documentation, which was scribed in my presence. The recorded information has been reviewed and considered for accuracy and completeness, addended by me as needed, and agree with information above.  Signed,   [name], MD Primary [facility] at [facility].  06/18/17 10:10 AM

## 2017-06-18 NOTE — Patient Instructions (Addendum)
  Walking for exercise, or yardwork (low to moderate intensity exercise) 3-4 days per week.   I will repeat blood count, and if that significantly increased would recommend follow-up with hematology.  I ordered the ultrasound to screen for abdominal aortic aneurysm. Currently I do not see that the CT scan of the lungs would be covered. We'll need to check into that further to see how we can get it covered for lung cancer screening.  Recheck in 6 months if A1c is controlled. Let me know if there are questions sooner. Thanks for coming in today.   IF you received an x-ray today, you will receive an invoice from [facility]. Please contact [facility] at [phone] with questions or concerns regarding your invoice.   IF you received labwork today, you will receive an invoice from [facility]. Please contact [facility] at [phone] with questions or concerns regarding your invoice.   Our billing staff will not be able to assist you with questions regarding bills from these companies.  You will be contacted with the lab results as soon as they are available. The fastest way to get your results is to activate your My Chart account. Instructions are located on the last page of this paperwork. If you have not heard from [place] regarding the results in 2 weeks, please contact this office.

## 2017-06-20 ENCOUNTER — Ambulatory Visit (HOSPITAL_COMMUNITY): Payer: Self-pay | Admitting: Licensed Clinical Social Worker

## 2017-06-22 ENCOUNTER — Encounter (HOSPITAL_COMMUNITY): Admission: RE | Admit: 2017-06-22 | Payer: Medicare Other | Source: Ambulatory Visit

## 2017-06-22 ENCOUNTER — Encounter (HOSPITAL_COMMUNITY)
Admission: RE | Admit: 2017-06-22 | Discharge: 2017-06-22 | Disposition: A | Discharge status: Home / Self Care | Payer: Medicare Other | Source: Ambulatory Visit | Attending: Urology | Admitting: Urology

## 2017-06-22 ENCOUNTER — Ambulatory Visit (INDEPENDENT_AMBULATORY_CARE_PROVIDER_SITE_OTHER): Payer: Medicare Other | Admitting: Pharmacist Clinician (PhC)/ Clinical Pharmacy Specialist

## 2017-06-22 ENCOUNTER — Ambulatory Visit (INDEPENDENT_AMBULATORY_CARE_PROVIDER_SITE_OTHER): Payer: Medicare Other | Admitting: Psychiatry

## 2017-06-22 ENCOUNTER — Encounter (HOSPITAL_COMMUNITY): Payer: Self-pay | Admitting: Psychiatry

## 2017-06-22 VITALS — BP 130/76 | HR 67 | Ht 69.0 in | Wt 226.0 lb

## 2017-06-22 DIAGNOSIS — F324 Major depressive disorder, single episode, in partial remission: Secondary | ICD-10-CM

## 2017-06-22 DIAGNOSIS — R9721 Rising PSA following treatment for malignant neoplasm of prostate: Secondary | ICD-10-CM | POA: Diagnosis not present

## 2017-06-22 DIAGNOSIS — C61 Malignant neoplasm of prostate: Secondary | ICD-10-CM | POA: Diagnosis not present

## 2017-06-22 DIAGNOSIS — F17210 Nicotine dependence, cigarettes, uncomplicated: Secondary | ICD-10-CM | POA: Diagnosis not present

## 2017-06-22 DIAGNOSIS — Z79899 Other long term (current) drug therapy: Secondary | ICD-10-CM | POA: Diagnosis not present

## 2017-06-22 DIAGNOSIS — F329 Major depressive disorder, single episode, unspecified: Secondary | ICD-10-CM

## 2017-06-22 DIAGNOSIS — I480 Paroxysmal atrial fibrillation: Secondary | ICD-10-CM | POA: Diagnosis not present

## 2017-06-22 DIAGNOSIS — R599 Enlarged lymph nodes, unspecified: Secondary | ICD-10-CM | POA: Insufficient documentation

## 2017-06-22 DIAGNOSIS — Z7901 Long term (current) use of anticoagulants: Secondary | ICD-10-CM

## 2017-06-22 DIAGNOSIS — I482 Chronic atrial fibrillation, unspecified: Secondary | ICD-10-CM

## 2017-06-22 LAB — POCT INR: INR: 3.5

## 2017-06-22 MED ORDER — BUPROPION HCL ER (XL) 150 MG PO TB24: ORAL_TABLET | ORAL | 2 refills | Status: DC

## 2017-06-22 MED ORDER — AXUMIN (FLUCICLOVINE F 18) INJECTION
9.6000 | Freq: Once | INTRAVENOUS | Status: AC
  Administered 2017-06-22: 9.6 via INTRAVENOUS

## 2017-06-22 NOTE — Progress Notes (Signed)
Psychiatric Initial Adult Assessment   Patient Identification: Rodney Friedman MRN:  [id number] Date of Evaluation:  06/22/2017 Referral Source: Dr. Nyoka Cowden Chief Complaint: Need follow-up care   Visit Diagnosis: Bipolar disorder, substance use disorder No diagnosis found.  History of Present Illness: Today the patient is seen on time. Patient shares with me that his prostate cancer has recurred his PSA is approximately 7. Previously was 0. He's being staged at this time. He denies any bone pain or shortness of breath. He denies any overt evidence of metastatic disease at this time. The patient is resilient. He is not this came down. He learned of this 3 weeks ago but is handling it. He sharing with his new therapist Janett Billow.the patient continues going to AA and NA. He has a good sponsor. He denies being depressed. He is somewhat frightened. Is not overtly anxious. He is sleeping and eating well and has good energy. He continues to be very active doing multiple things. He draws: 2 views has his own while and reads a great deal. His focus life is taking care of his 22s-year-old followed the lives with. Patient also since Monday to his family members who are in [place]. Other than this issue of prostate he's feeling physically well. Financially he is stable.The patient denies symptoms of psychosis. He denies being suicidal. He drinks no alcohol uses no substances. He remains calm articulate and focused. Past Psychiatric History: Other psychiatrist care for over 20 years taking mainly Lamictal and Wellbutrin  Previous Psychotropic Medications: Lamictal 300 mg Wellbutrin 150 Substance Abuse History in the last 12 months:  Yes.    Consequences of Substance Abuse: NA  Past Medical History:  Past Medical History:  Diagnosis Date  . Adenomatous colon polyp   . Alcoholism ([place])   . Anxiety   . Atrial fibrillation ([place])   . CAD (coronary artery disease)   . Cataract   . 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 Depression   . Diabetes mellitus without complication ([place])   . Diverticulosis   . Eye abnormality    Macular scarring R eye  . Glaucoma   . Heart murmur   . HLD (hyperlipidemia)   . Hypertension   . Leukemia ([place])    CLL  . Myocardial infarction ([place])   . Prostate cancer ([place]) 07/08/2013  . Sleep apnea   . Substance abuse ([place])   . 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 Arm Surgery Right    from door accident with glass  . CATARACT EXTRACTION W/ INTRAOCULAR LENS IMPLANT Left   . COLONOSCOPY    . CORONARY ARTERY BYPASS GRAFT  08/04/2004    Family Psychiatric History:   Family History:  Family History  Problem Relation Age of Onset  . Colon cancer Mother   . Ovarian cancer Mother   . Uterine cancer Mother   . Hypertension Father   . Prostate cancer Father     Social History:   Social History   Socioeconomic History  . Marital status: Divorced    Spouse name: None  . Number of children: 2  . Years of education: 67  . Highest education level: None  Social Needs  . Financial resource strain: None  . Food insecurity - worry: None  . Food insecurity - inability: None  . Transportation needs - medical: None  . Transportation needs - non-medical: None  Occupational History  . Occupation: Retired Pharmacist, hospital  Tobacco Use  .  Smoking status: Current Every Day Smoker    Packs/day: 0.75    Years: 50.00    Pack years: 37.50    Types: Cigarettes  . Smokeless tobacco: Never Used  Substance and Sexual Activity  . Alcohol use: No    Alcohol/week: 0.0 oz  . Drug use: No  . Sexual activity: Not Currently  Other Topics Concern  . None  Social History Narrative   Lives at home w/ his father   Divorced   Right-handed   Education: College   No caffeine    Additional Social History:   Allergies:   Allergies  Allergen Reactions  . Other Rash    Allergen: "Plants and bushes while doing yard  work"    Metabolic Disorder Labs: Lab Results  Component Value Date   HGBA1C 6.6 (H) 06/18/2017   MPG 117 02/11/2016   MPG 128 (H) 03/22/2015   No results found for: PROLACTIN Lab Results  Component Value Date   CHOL 131 02/08/2017   TRIG 213 (H) 02/08/2017   HDL 27 (L) 02/08/2017   CHOLHDL 4.9 02/08/2017   VLDL 41 (H) 03/10/2016   LDLCALC 61 02/08/2017   LDLCALC 103 (H) 10/26/2016     Current Medications: Current Outpatient Medications  Medication Sig Dispense Refill  . amLODipine (NORVASC) 10 MG tablet Take 1 tablet (10 mg total) by mouth daily. 90 tablet 1  . B Complex Vitamins (VITAMIN-B COMPLEX PO) Take by mouth.    Marland Kitchen BIOTIN PO Take by mouth.    Marland Kitchen buPROPion (WELLBUTRIN XL) 150 MG 24 hr tablet 1  qam 90 tablet 2  . Calcium Carbonate-Vit D-Min (CALCIUM 1200 PO) Take 1,200 mg by mouth daily.    Mariane Baumgarten Calcium (STOOL SOFTENER PO) Take 1 tablet as needed by mouth.    Marland Kitchen ketoconazole (NIZORAL) 2 % shampoo Apply 1 application topically 2 (two) times a week. 120 mL 1  . latanoprost (XALATAN) 0.005 % ophthalmic solution Once a day every second day    . LYCOPENE PO Take 8 mg by mouth daily.    . metFORMIN (GLUCOPHAGE) 500 MG tablet Take 1 tablet (500 mg total) by mouth every evening. 90 tablet 1  . metoprolol succinate (TOPROL-XL) 100 MG 24 hr tablet Take 1 tablet (100 mg total) by mouth daily. Take with or immediately following a meal. 90 tablet 1  . Omega-3 Fatty Acids (FISH OIL) 1000 MG CAPS Take 1,000 mg by mouth daily.    . rosuvastatin (CRESTOR) 20 MG tablet Take 1 tablet (20 mg total) by mouth daily. 90 tablet 1  . valsartan (DIOVAN) 320 MG tablet TAKE 1 TABLET BY MOUTH EVERY DAY 90 tablet 1  . vitamin B-12 (CYANOCOBALAMIN) 500 MCG tablet Take 500 mcg by mouth daily.    . vitamin C (ASCORBIC ACID) 500 MG tablet Take 500 mg by mouth daily.    Marland Kitchen warfarin (COUMADIN) 4 MG tablet TAKE 1 TO 1 & 1/2 TABLETS BY MOUTH EVERY DAY AS DIRECTED BY COUMADIN CLINIC 120 tablet 0   No  current facility-administered medications for this visit.     Neurologic: Headache: No Seizure: No Paresthesias:No  Musculoskeletal: Strength & Muscle Tone: within normal limits Gait & Station: normal Patient leans: Right  Psychiatric Specialty Exam: ROS  Blood pressure 130/76, pulse 67, height 5\' 9"  (1.753 m), weight 226 lb (102.5 kg).Body mass index is 33.37 kg/m.  General Appearance: Casual  Eye Contact:  Good  Speech:  Clear and Coherent  Volume:  Normal  Mood:  NA  Affect:  Appropriate  Thought Process:  Goal Directed  Orientation:  NA  Thought Content:  WDL  Suicidal Thoughts:  No  Homicidal Thoughts:  No  Memory:  Negative  Judgement:  Good  Insight:  Good  Psychomotor Activity:  Normal  Concentration:    Recall:  [place] of Knowledge:Good  Language: Good  Akathisia:  No  Handed:  Right  AIMS (if indicated):    Assets:  Desire for Improvement  ADL's:  Intact  Cognition: WNL  Sleep:      Treatment Plan Summary: 2/1/20199:00 AM  Today the patient is #1 trouble is recurrence of his prostate cancer. He is actively involved with his oncologist and is being staged at this time to determine the appropriate treatment. His second problemis that her major clinical depression. He does very well on low-dose Wellbutrin and will continue taking 150 mg XL. The patient is sleeping and eating well. This patient will continue in therapy and return to see me in 5 months for just a15 minute med check.

## 2017-06-26 DIAGNOSIS — Z961 Presence of intraocular lens: Secondary | ICD-10-CM | POA: Diagnosis not present

## 2017-06-26 DIAGNOSIS — H35713 Central serous chorioretinopathy, bilateral: Secondary | ICD-10-CM | POA: Diagnosis not present

## 2017-06-26 DIAGNOSIS — H401132 Primary open-angle glaucoma, bilateral, moderate stage: Secondary | ICD-10-CM | POA: Diagnosis not present

## 2017-06-26 DIAGNOSIS — H31011 Macula scars of posterior pole (postinflammatory) (post-traumatic), right eye: Secondary | ICD-10-CM | POA: Diagnosis not present

## 2017-06-26 DIAGNOSIS — H2513 Age-related nuclear cataract, bilateral: Secondary | ICD-10-CM | POA: Diagnosis not present

## 2017-06-27 ENCOUNTER — Ambulatory Visit (HOSPITAL_COMMUNITY): Payer: Self-pay | Admitting: Licensed Clinical Social Worker

## 2017-06-28 DIAGNOSIS — C61 Malignant neoplasm of prostate: Secondary | ICD-10-CM | POA: Diagnosis not present

## 2017-06-28 DIAGNOSIS — C778 Secondary and unspecified malignant neoplasm of lymph nodes of multiple regions: Secondary | ICD-10-CM | POA: Diagnosis not present

## 2017-07-06 ENCOUNTER — Encounter: Payer: Self-pay | Admitting: Family Medicine

## 2017-07-09 ENCOUNTER — Other Ambulatory Visit: Payer: Self-pay | Admitting: Family Medicine

## 2017-07-09 DIAGNOSIS — I10 Essential (primary) hypertension: Secondary | ICD-10-CM

## 2017-07-10 ENCOUNTER — Telehealth: Payer: Self-pay | Admitting: Oncology

## 2017-07-10 ENCOUNTER — Ambulatory Visit
Admission: RE | Admit: 2017-07-10 | Discharge: 2017-07-10 | Disposition: A | Discharge status: Home / Self Care | Payer: Medicare Other | Source: Ambulatory Visit | Attending: Family Medicine | Admitting: Family Medicine

## 2017-07-10 ENCOUNTER — Other Ambulatory Visit: Payer: Self-pay | Admitting: Family Medicine

## 2017-07-10 ENCOUNTER — Encounter: Payer: Self-pay | Admitting: Oncology

## 2017-07-10 DIAGNOSIS — Z136 Encounter for screening for cardiovascular disorders: Secondary | ICD-10-CM

## 2017-07-10 DIAGNOSIS — I714 Abdominal aortic aneurysm, without rupture: Secondary | ICD-10-CM | POA: Diagnosis not present

## 2017-07-10 DIAGNOSIS — Z87891 Personal history of nicotine dependence: Secondary | ICD-10-CM | POA: Diagnosis not present

## 2017-07-10 NOTE — Telephone Encounter (Signed)
Appt has been scheduled for the pt to see Dr. Alen Blew on 3/12 at 11am. Letter mailed.

## 2017-07-11 ENCOUNTER — Encounter: Payer: Self-pay | Admitting: Cardiovascular Disease

## 2017-07-11 DIAGNOSIS — I10 Essential (primary) hypertension: Secondary | ICD-10-CM

## 2017-07-11 MED ORDER — VALSARTAN 320 MG PO TABS: ORAL_TABLET | ORAL | 2 refills | Status: DC

## 2017-07-15 ENCOUNTER — Encounter: Payer: Self-pay | Admitting: Cardiovascular Disease

## 2017-07-31 ENCOUNTER — Encounter: Payer: Self-pay | Admitting: Family Medicine

## 2017-07-31 ENCOUNTER — Ambulatory Visit: Payer: Self-pay | Admitting: Oncology

## 2017-07-31 ENCOUNTER — Inpatient Hospital Stay: Payer: Medicare Other | Attending: Oncology | Admitting: Oncology

## 2017-07-31 VITALS — BP 128/57 | HR 61 | Temp 97.6°F | Resp 18 | Ht 69.0 in | Wt 219.7 lb

## 2017-07-31 DIAGNOSIS — C61 Malignant neoplasm of prostate: Secondary | ICD-10-CM

## 2017-07-31 DIAGNOSIS — C9110 Chronic lymphocytic leukemia of B-cell type not having achieved remission: Secondary | ICD-10-CM | POA: Diagnosis not present

## 2017-07-31 DIAGNOSIS — C779 Secondary and unspecified malignant neoplasm of lymph node, unspecified: Secondary | ICD-10-CM | POA: Diagnosis not present

## 2017-07-31 NOTE — Progress Notes (Signed)
Reason for Referral: Prostate cancer.   HPI: 72 year old gentleman currently of [place] where he has no limiting since 2015. He has relocated from [place] to live with his father. He was diagnosed with prostate cancer in May 2014. At that time his PSA was 9.6 and he had a Gleason score 4+4 = 8 and his clinical stage was T1c. He received definitive therapy with radiation that was completed on 12/31/2012. At that time was also started Lupron which he has a taking at the time to about 2015. He established care with Dr. Karsten Ro when he moved to this area. His PSA was undetectable on 08/13/2014 where and he received his last Lupron injection.  He remained on active surveillance at that time and his PSA starts to rise to 0.0 02/28/2015, 0.44 in April 2017, 0.96 in April 2018, 3.45 in October 2018. His last PSA on 05/24/2017 was 7.1. He underwent PET scan on 06/22/2017 which showed intense radiotracer uptake in the left external iliac lymph node consistent with prostate cancer no metastasis. Lupron was restarted on February 2019. He remains asymptomatic for his prostate cancer at this time. He also follows with Dr. Alvy Bimler for CLL that has not required any treatment.   He does not report any headaches, blurry vision, syncope or seizures. Does not report any fevers, chills or sweats.  Does not report any cough, wheezing or hemoptysis.  Does not report any chest pain, palpitation, orthopnea or leg edema.  Does not report any nausea, vomiting or abdominal pain.  Does not report any constipation or diarrhea.  Does not report any skeletal complaints.    Does not report frequency, urgency or hematuria.  Does not report any skin rashes or lesions. Does not report any heat or cold intolerance.  Does not report any lymphadenopathy or petechiae.  Does not report any anxiety or depression.  Remaining review of systems is negative.    Past Medical History:  Diagnosis Date  . Adenomatous colon polyp   . Alcoholism ([place])    . Anxiety   . Atrial fibrillation ([place])   . CAD (coronary artery disease)   . Cataract   . CLL (chronic lymphocytic leukemia) ([place]) 07/08/2013  . Depression   . Diabetes mellitus without complication ([place])   . Diverticulosis   . Eye abnormality    Macular scarring R eye  . Glaucoma   . Heart murmur   . HLD (hyperlipidemia)   . Hypertension   . Leukemia ([place])    CLL  . Myocardial infarction ([place])   . Prostate cancer ([place]) 07/08/2013  . Sleep apnea   . Substance abuse ([place])   . 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 Arm Surgery Right    from door accident with glass  . CATARACT EXTRACTION W/ INTRAOCULAR LENS IMPLANT Left   . COLONOSCOPY    . CORONARY ARTERY BYPASS GRAFT  08/04/2004  :   Current Outpatient Medications:  .  amLODipine (NORVASC) 10 MG tablet, Take 1 tablet (10 mg total) by mouth daily., Disp: 90 tablet, Rfl: 1 .  B Complex Vitamins (VITAMIN-B COMPLEX PO), Take by mouth., Disp: , Rfl:  .  BIOTIN PO, Take by mouth., Disp: , Rfl:  .  buPROPion (WELLBUTRIN XL) 150 MG 24 hr tablet, 1  qam, Disp: 90 tablet, Rfl: 2 .  Calcium Carbonate-Vit D-Min (CALCIUM 1200 PO), Take 1,200 mg by mouth daily., Disp: , Rfl:  .  Docusate Calcium (STOOL SOFTENER PO), Take  1 tablet as needed by mouth., Disp: , Rfl:  .  ketoconazole (NIZORAL) 2 % shampoo, Apply 1 application topically 2 (two) times a week., Disp: 120 mL, Rfl: 1 .  latanoprost (XALATAN) 0.005 % ophthalmic solution, Once a day every second day, Disp: , Rfl:  .  LYCOPENE PO, Take 8 mg by mouth daily., Disp: , Rfl:  .  metFORMIN (GLUCOPHAGE) 500 MG tablet, Take 1 tablet (500 mg total) by mouth every evening., Disp: 90 tablet, Rfl: 1 .  metoprolol succinate (TOPROL-XL) 100 MG 24 hr tablet, Take 1 tablet (100 mg total) by mouth daily. Take with or immediately following a meal., Disp: 90 tablet, Rfl: 1 .  Omega-3 Fatty Acids (FISH OIL) 1000 MG CAPS, Take 1,000 mg by mouth daily.,  Disp: , Rfl:  .  rosuvastatin (CRESTOR) 20 MG tablet, Take 1 tablet (20 mg total) by mouth daily., Disp: 90 tablet, Rfl: 1 .  valsartan (DIOVAN) 320 MG tablet, TAKE 1 TABLET BY MOUTH EVERY DAY, Disp: 90 tablet, Rfl: 2 .  vitamin B-12 (CYANOCOBALAMIN) 500 MCG tablet, Take 500 mcg by mouth daily., Disp: , Rfl:  .  vitamin C (ASCORBIC ACID) 500 MG tablet, Take 500 mg by mouth daily., Disp: , Rfl:  .  warfarin (COUMADIN) 4 MG tablet, TAKE 1 TO 1 & 1/2 TABLETS BY MOUTH EVERY DAY AS DIRECTED BY COUMADIN CLINIC, Disp: 120 tablet, Rfl: 0:  Allergies  Allergen Reactions  . Other Rash    Allergen: "Plants and bushes while doing yard work"  :  Family History  Problem Relation Age of Onset  . Colon cancer Mother   . Ovarian cancer Mother   . Uterine cancer Mother   . Hypertension Father   . Prostate cancer Father   :  Social History   Socioeconomic History  . Marital status: Divorced    Spouse name: Not on file  . Number of children: 2  . Years of education: 28  . Highest education level: Not on file  Social Needs  . Financial resource strain: Not on file  . Food insecurity - worry: Not on file  . Food insecurity - inability: Not on file  . Transportation needs - medical: Not on file  . Transportation needs - non-medical: Not on file  Occupational History  . Occupation: Retired Pharmacist, hospital  Tobacco Use  . Smoking status: Current Every Day Smoker    Packs/day: 0.75    Years: 50.00    Pack years: 37.50    Types: Cigarettes  . Smokeless tobacco: Never Used  Substance and Sexual Activity  . Alcohol use: No    Alcohol/week: 0.0 oz  . Drug use: No  . Sexual activity: Not Currently  Other Topics Concern  . Not on file  Social History Narrative   Lives at home w/ his father   Divorced   Right-handed   Education: College   No caffeine  :  Pertinent items are noted in HPI.  Exam: Blood pressure (!) 128/57, pulse 61, temperature 97.6 F (36.4 C), temperature source Oral, resp.  rate 18, height 5\' 9"  (1.753 m), weight 219 lb 11.2 oz (99.7 kg), SpO2 100 %.  ECOG 0 General appearance: alert and cooperative appeared without distress. Head: atraumatic without any abnormalities. Eyes: conjunctivae/corneas clear. PERRL.  Sclera anicteric. Throat: lips, mucosa, and tongue normal; without oral thrush or ulcers. Resp: clear to auscultation bilaterally without rhonchi, wheezes or dullness to percussion. Cardio: regular rate and rhythm, S1, S2 normal, no murmur, click,  rub or gallop GI: soft, non-tender; bowel sounds normal; no masses,  no organomegaly Skin: Skin color, texture, turgor normal. No rashes or lesions Lymph nodes: Cervical, supraclavicular, and axillary nodes normal. Neurologic: Grossly normal without any motor, sensory or deep tendon reflexes. Musculoskeletal: No joint deformity or effusion.  CBC    Component Value Date/Time   WBC 16.9 (H) 06/18/2017 1038   WBC 17.3 (H) 01/01/2017 0752   WBC 14.6 (H) 03/22/2015 0845   RBC 5.06 06/18/2017 1038   RBC 5.06 01/01/2017 0752   RBC 4.67 03/22/2015 0845   HGB 15.7 06/18/2017 1038   HGB 15.8 01/01/2017 0752   HCT 46.5 06/18/2017 1038   HCT 46.4 01/01/2017 0752   PLT 199 06/18/2017 1038   MCV 92 06/18/2017 1038   MCV 91.8 01/01/2017 0752   MCH 31.0 06/18/2017 1038   MCH 31.2 01/01/2017 0752   MCH 31.0 03/22/2015 0845   MCHC 33.8 06/18/2017 1038   MCHC 34.0 01/01/2017 0752   MCHC 34.3 03/22/2015 0845   RDW 14.3 06/18/2017 1038   RDW 13.5 01/01/2017 0752   LYMPHSABS 9.7 (H) 01/01/2017 0752   MONOABS 1.5 (H) 01/01/2017 0752   EOSABS 0.4 01/01/2017 0752   BASOSABS 0.1 01/01/2017 0752    US Abdominal Aorta Screening Aaa  Result Date: 07/10/2017 CLINICAL DATA:  Screening for abdominal aortic aneurysm. EXAM: ULTRASOUND OF ABDOMINAL AORTA TECHNIQUE: Ultrasound examination of the abdominal aorta was performed to evaluate for abdominal aortic aneurysm. COMPARISON:  None. FINDINGS: Abdominal aortic measurements  as follows: Proximal:  2.7 cm Mid:  2.1 cm Distal:  2.1 cm Atherosclerotic changes noted. IMPRESSION: No evidence of abdominal aortic aneurysm. Aortic Atherosclerosis (ICD10-I70.0). Electronically Signed   By: Marijo Conception, M.D.   On: 07/10/2017 13:50    Assessment and Plan:    72 year old gentleman with the following issues:  1. 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 His initial diagnosis was in May 2014 with a PSA of 9.6 and a Gleason score 4+4 = 8. He was treated with androgen deprivation therapy for close to 2 years in addition to definitive radiation. He developed relapse disease with a PSA rise up to 7.2 in January 2019. His PSA was 3.45 in October 2018 and 0.96 in April 2018. PET scan did confirm the presence of lymphadenopathy.  The natural course of this disease was reviewed today in detail. Treatment options were also discussed for advanced hormone sensitive disease. He understands he has an incurable malignancy but disease that can be palliated and treated for an extended period of time. He has an excellent performance status and his disease volume is low and his prognosis is reasonably better than the majority of stage IV prostate cancer. However, he does have a Gleason score indicates poor risk features and would benefit from additional therapy.  I have recommended continuing Lupron indefinitely at this time.  The rationale for adding Zytiga to androgen deprivation therapy was discussed today in detail. Based on multiple trials that addition of Zytiga to androgen deprivation therapy given his high-risk disease has been beneficial with overall survival improvement of close to 13 months. Risks and benefits associated with this medication including 5 mg of prednisone was discussed today in details. Complications include nausea, fatigue, hypokalemia, hypertension, electrolyte imbalance, adrenal insufficiency among others.  After  discussion today, written information was given to the patient and he will make his decision in the near future. He understands if he starts taking this medication he  will require frequent monitoring including liver function test and electrolytes.  2. CLL: Followed by Dr. Alvy Bimler. His white cell count has not changed dramatically over the last few years. No indication for treatment is noted.  3. Follow-up: To be determined at this time depending on his willingness to proceed with additional therapy.

## 2017-08-01 ENCOUNTER — Encounter (HOSPITAL_COMMUNITY): Payer: Self-pay | Admitting: Licensed Clinical Social Worker

## 2017-08-01 ENCOUNTER — Telehealth: Payer: Self-pay

## 2017-08-01 ENCOUNTER — Ambulatory Visit (INDEPENDENT_AMBULATORY_CARE_PROVIDER_SITE_OTHER): Payer: Medicare Other | Admitting: Licensed Clinical Social Worker

## 2017-08-01 DIAGNOSIS — F3130 Bipolar disorder, current episode depressed, mild or moderate severity, unspecified: Secondary | ICD-10-CM

## 2017-08-01 NOTE — Progress Notes (Signed)
THERAPIST PROGRESS NOTE  Session Time: 9:45am-11:00am  Participation Level: Active  Behavioral Response: NeatAlertDepressed   Type of Therapy: Individual Therapy  Treatment Goals addressed: Improve psychiatric symptoms, elevate mood (decreased irritability, increased enjoyment of activities), Improve unhelpful thought patterns, emotional regulation skills (reduce temper outburst), Interpersonal relationship skills  Interventions: Motivational Interviewing and Other: Grounding and Mindfulness techniques  Summary: Amitai Lozano is a 71 y.o. male who presents with Bipolar 1 Disorder, Most recent episode depressed  Suicidal/Homicidal: No without intent/plan  Therapist Response:  Spero met with clinician for individual therapy. Tytus discussed his psychiatric symptoms and current life events. Sie shared updates about the traumas over the past month. Ivie reports he has been coping with his father's health issues, concerns about his children and grandchildren, and his own impending health issues. Clinician processed these experiences with Jermone and identified positive coping ability. Clinician utilized MI OARS to reflect and summarize thoughts and feelings, as well as past experiences dealing with health problems for himself and his father. Clinician provided resources and psychoeducation about how to cope with any type of dementia or memory loss in his father, including using music as a way to reduce or alleviate stressful situations. Zacari reports concerns about his own health with prostate cancer. He processed through his treatment options and was able to identify his choices. Clinician also noted that since the doctors did not seem to panic, his best option would be to not panic as well.   Plan: Return again in 1-2 weeks.  Diagnosis:     Axis I: Bipolar 1 Disorder, Most recent episode depressed  Jessica R Schlosberg, LCSW 08/01/2017  

## 2017-08-01 NOTE — Telephone Encounter (Signed)
No los per 3/12. °

## 2017-08-03 ENCOUNTER — Ambulatory Visit (INDEPENDENT_AMBULATORY_CARE_PROVIDER_SITE_OTHER): Payer: Medicare Other | Admitting: Pharmacist Clinician (PhC)/ Clinical Pharmacy Specialist

## 2017-08-03 DIAGNOSIS — I482 Chronic atrial fibrillation, unspecified: Secondary | ICD-10-CM

## 2017-08-03 DIAGNOSIS — I480 Paroxysmal atrial fibrillation: Secondary | ICD-10-CM

## 2017-08-03 DIAGNOSIS — Z7901 Long term (current) use of anticoagulants: Secondary | ICD-10-CM | POA: Diagnosis not present

## 2017-08-03 LAB — POCT INR: INR: 2.8

## 2017-08-08 ENCOUNTER — Telehealth: Payer: Self-pay

## 2017-08-08 ENCOUNTER — Encounter (HOSPITAL_COMMUNITY): Payer: Self-pay | Admitting: Licensed Clinical Social Worker

## 2017-08-08 ENCOUNTER — Ambulatory Visit (HOSPITAL_COMMUNITY): Payer: Medicare Other | Admitting: Licensed Clinical Social Worker

## 2017-08-08 DIAGNOSIS — F3130 Bipolar disorder, current episode depressed, mild or moderate severity, unspecified: Secondary | ICD-10-CM

## 2017-08-08 NOTE — Telephone Encounter (Signed)
Abigail Butts,  Thanks for letting me know.  I must communicate with his cardiologist to make sure they are still agreeable to him being off his coumadin 4 days prior to procedure.  We will get back to him.  Vivien Rota,    Please get a message to his cardiologist and request approval for stopping coumadin 4 days prior to colonoscopy.  When we get an answer, then we can plan procedure for him.

## 2017-08-08 NOTE — Telephone Encounter (Signed)
Dr. Loletha Carrow this patient walked in to schedule his colonoscopy. He is currently still on warfarin, and  per your last ov note you stated you would review his chart to evaluate his health. He states the only change is he started Lupron at the beginning of February for his prostate cancer.

## 2017-08-08 NOTE — Progress Notes (Signed)
   THERAPIST PROGRESS NOTE  Session Time: 11:00am-12:00pm  Participation Level: Active  Behavioral Response: Well GroomedAlertDepressed  Type of Therapy: Individual Therapy  Treatment Goals addressed: Improve psychiatric symptoms, elevate mood (decreased irritability, increased enjoyment of activities), Improve unhelpful thought patterns, emotional regulation skills (reduce temper outburst), Interpersonal relationship skills  Interventions: Motivational Interviewing and Other: Grounding and Mindfulness techniques  Summary: Zachary Nole is a 72 y.o. male who presents with Bipolar 1 Disorder, Most recent episode depressed  Suicidal/Homicidal: No without intent/plan  Therapist Response:  [name] met with clinician for individual therapy. Liban discussed his psychiatric symptoms and current life events. Lovis shared increased depression over the past week, and more thoughts about the past. Knut processed more deeply his experiences with moving to [place], getting comfortable in town, and having to unfriend the person who helped him at the time. He identified the concerns about being manipulated and what it felt like to not be able to trust.  [name] discussed concerns about his son and ongoing frustrations about having to support his son. Clinician explored the difference between support and enabling. Clinician also explored what reduced self confidence in son as a young adult. Tyce reminded himself of his AA teachings about "one day at a time" regarding all of life's stressors: personal health, father's health, son, daughter, grandchildren, etc.  Clinician explored self-care and increasing social network. Clinician also provided resources in community in order to get out of the house and find some joy.    Plan: Return again in 1-2 weeks.  Diagnosis:     Axis I: Bipolar 1 Disorder, Most recent episode depressed  [name], LCSW 08/08/2017

## 2017-08-09 ENCOUNTER — Other Ambulatory Visit: Payer: Self-pay

## 2017-08-09 ENCOUNTER — Telehealth: Payer: Self-pay

## 2017-08-09 NOTE — Telephone Encounter (Signed)
  RE: Rodney Friedman DOB: May 13, 1946 MRN: [id number]   Dear Dr Sallyanne Kuster,    We have scheduled the above patient for an endoscopic procedure (colonoscopy). Our records show that he is on anticoagulation therapy.  Please advise as to how long the patient may come off his therapy of Coumadin prior to the procedure.  Please route back response to [facility]   Sincerely,    Dr. Wilfrid Lund

## 2017-08-09 NOTE — Telephone Encounter (Signed)
We have scheduled [place] for a pre-visit on 09-19-2017 and a colonoscopy on 09-25-2017. Request to hold the pts Coumadin has been sent to pre-op clearance. Will await reply.

## 2017-08-10 DIAGNOSIS — H26492 Other secondary cataract, left eye: Secondary | ICD-10-CM | POA: Diagnosis not present

## 2017-08-10 NOTE — Telephone Encounter (Signed)
Patient with diagnosis of atrial fibrillation on warfarin for anticoagulation.    Procedure: colonoscopy Date of procedure: TBD  CHADS2-VASc score of  4 (HTN, AGE, DM2, CAD)  CrCl 97.3 Platelet count 199  Per office protocol, patient can hold warfarin for 5 days prior to procedure.    Patient should restart warfarin on the evening of procedure or day after, at discretion of procedure MD

## 2017-08-10 NOTE — Telephone Encounter (Signed)
   Primary Cardiologist: Sanda Klein, MD  Chart reviewed as part of pre-operative protocol coverage.  He has a history of coronary artery disease status post CABG in 2006, paroxysmal atrial fibrillation, chronic lymphocytic leukemia, sleep apnea, mild aortic stenosis, diabetes.  Echocardiogram at [facility] in [place] in July 2014 demonstrated normal LV function.  Nuclear stress test at [facility]ology in [facility] in June 2013 demonstrated no ischemia.    CHADS2-VASc=4 (age x 1, HTN, CAD, DM).    He was last seen on 04/09/17 by Dr. Sallyanne Kuster.    Phone note will be routed to our Coumadin clinic for recommendations regarding Coumadin around the time of his procedure.  Patient will also need a phone call to review symptoms since last visit.  Richardson Dopp, PA-C 08/10/2017, 9:29 AM

## 2017-08-13 NOTE — Telephone Encounter (Signed)
   Primary Cardiologist: Sanda Klein, MD  Chart reviewed as part of pre-operative protocol coverage. Patient was contacted 08/13/2017 in reference to pre-operative risk assessment for pending surgery as outlined below.  Rodney Friedman was last seen on 04/09/17 by Dr. Sallyanne Kuster.  Since that day, Rodney Friedman has done well. No cardiac symptoms. DASI is 6.61METs. 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  Therefore, based on ACC/AHA guidelines, the patient would be at acceptable risk for the planned procedure without further cardiovascular testing.   I will route this recommendation to the requesting party via Epic fax function and remove from pre-op pool.  Please call with questions.  [place], [place] 08/13/2017, 1:34 PM

## 2017-08-15 ENCOUNTER — Ambulatory Visit (HOSPITAL_COMMUNITY): Payer: Self-pay | Admitting: Licensed Clinical Social Worker

## 2017-08-15 NOTE — Telephone Encounter (Signed)
Pt. has been cleared to hold his coumadin 5 days prior to colonoscopy. He states clear understanding.

## 2017-08-15 NOTE — Telephone Encounter (Signed)
Pt has been notified and aware to hold the coumadin 5 days prior to the colonoscopy. He also was reminded to keep his pre visit for 09-19-2017.

## 2017-08-22 ENCOUNTER — Ambulatory Visit (HOSPITAL_COMMUNITY): Payer: Medicare Other | Admitting: Licensed Clinical Social Worker

## 2017-08-28 ENCOUNTER — Encounter: Payer: Self-pay | Admitting: Gastroenterology

## 2017-08-29 ENCOUNTER — Ambulatory Visit (HOSPITAL_COMMUNITY): Payer: Self-pay | Admitting: Licensed Clinical Social Worker

## 2017-09-05 ENCOUNTER — Encounter (HOSPITAL_COMMUNITY): Payer: Self-pay | Admitting: Licensed Clinical Social Worker

## 2017-09-05 ENCOUNTER — Ambulatory Visit (INDEPENDENT_AMBULATORY_CARE_PROVIDER_SITE_OTHER): Payer: Medicare Other | Admitting: Licensed Clinical Social Worker

## 2017-09-05 DIAGNOSIS — F3130 Bipolar disorder, current episode depressed, mild or moderate severity, unspecified: Secondary | ICD-10-CM | POA: Diagnosis not present

## 2017-09-05 NOTE — Progress Notes (Signed)
   THERAPIST PROGRESS NOTE  Session Time: 9:00am-10:00am  Participation Level: Active  Behavioral Response: CasualAlertDepressed and Irritable  Type of Therapy: Individual Therapy  Treatment Goals addressed: Improve psychiatric symptoms, elevate mood (decreased irritability, increased enjoyment of activities), Improve unhelpful thought patterns, emotional regulation skills (reduce temper outburst), Interpersonal relationship skills  Interventions: Motivational Interviewing and Other: Grounding and Mindfulness techniques  Summary: Rodney Friedman Friedman is a 72 y.o. male who presents with Bipolar 1 Disorder, Most recent episode depressed  Suicidal/Homicidal: No without intent/plan  Therapist Response:  Rodney Friedman Friedman met with clinician for individual therapy. Rodney Friedman Friedman discussed his psychiatric symptoms and current life events. Rodney Friedman shared ongoing frustration with his health, his children, and his grandchildren. Clinician utilized MI OARS to reflect and summarize thoughts and feelings. Clinician noted the importance of letting go some anxiety over things he has no control over. However, Rodney Friedman denied this was an option for him. Clinician discussed the negative impact of stress on his own personal health and encouraged him to adopt a practice of relaxation, meditation, and gratitude in order to reduce the internalization of stress. Rodney Friedman Friedman processed concerns about his prostate cancer and treatment options. Clinician utilized MI to summarize his understanding of his options and to explore alternatives.   Plan: Return again in 1-2 weeks.  Diagnosis:     Axis I: Bipolar 1 Disorder, Most recent episode depressed  [name], LCSW 09/05/2017

## 2017-09-12 ENCOUNTER — Ambulatory Visit (HOSPITAL_COMMUNITY): Payer: Self-pay | Admitting: Licensed Clinical Social Worker

## 2017-09-17 ENCOUNTER — Ambulatory Visit (INDEPENDENT_AMBULATORY_CARE_PROVIDER_SITE_OTHER): Payer: Medicare Other | Admitting: Pharmacist Clinician (PhC)/ Clinical Pharmacy Specialist

## 2017-09-17 DIAGNOSIS — Z7901 Long term (current) use of anticoagulants: Secondary | ICD-10-CM

## 2017-09-17 DIAGNOSIS — I480 Paroxysmal atrial fibrillation: Secondary | ICD-10-CM

## 2017-09-17 DIAGNOSIS — I482 Chronic atrial fibrillation, unspecified: Secondary | ICD-10-CM

## 2017-09-17 LAB — POCT INR: INR: 4.4

## 2017-09-17 NOTE — Patient Instructions (Signed)
Description   Continue with 1 tablet daily.  Last dose of warfarin on May 1 for May 7 colonoscopy.  Restart warfarin on the day of or day after colonoscopy at discretion of GI MD.  Repeat INR on May 20

## 2017-09-19 ENCOUNTER — Other Ambulatory Visit: Payer: Self-pay

## 2017-09-19 ENCOUNTER — Ambulatory Visit (AMBULATORY_SURGERY_CENTER): Payer: Self-pay | Admitting: *Deleted

## 2017-09-19 VITALS — Ht 69.0 in | Wt 219.0 lb

## 2017-09-19 DIAGNOSIS — Z86010 Personal history of colonic polyps: Secondary | ICD-10-CM

## 2017-09-19 DIAGNOSIS — Z800 Family history of malignant neoplasm of digestive organs: Secondary | ICD-10-CM

## 2017-09-19 MED ORDER — PEG 3350-KCL-NA BICARB-NACL 420 G PO SOLR: 4000.0000 mL | Freq: Once | ORAL | 0 refills | Status: AC

## 2017-09-19 NOTE — Progress Notes (Signed)
No egg or soy allergy known to patient  No issues with past sedation with any surgeries  or procedures, no intubation problems  No diet pills per patient No home 02 use per patient  Pt is on Coumadin-  blood thinners per patient - last dose Sunday 09-16-17 per pt. -okay'd to hold 5 days per cardio- told was high so instructed to stop Monday per cardio 4-29- all per pt in PV today  Pt denies issues with constipation  hx of A fib  EMMI video sent to pt's e mail - pt declined  Hx prostate cancer- had radiation 2014- [place] at [facility]- getting Lupron shot every 6 months - traces in prostate and 2 lymph nodes left hip- non focused traces per pt  Pt has hired a Film/video editor to come as his care partner for his colon

## 2017-09-25 ENCOUNTER — Ambulatory Visit (AMBULATORY_SURGERY_CENTER): Payer: Medicare Other | Admitting: Gastroenterology

## 2017-09-25 ENCOUNTER — Encounter: Payer: Self-pay | Admitting: Gastroenterology

## 2017-09-25 ENCOUNTER — Other Ambulatory Visit: Payer: Self-pay

## 2017-09-25 VITALS — BP 129/53 | HR 55 | Temp 98.0°F | Resp 18 | Ht 69.0 in | Wt 219.0 lb

## 2017-09-25 DIAGNOSIS — G4733 Obstructive sleep apnea (adult) (pediatric): Secondary | ICD-10-CM | POA: Diagnosis not present

## 2017-09-25 DIAGNOSIS — E669 Obesity, unspecified: Secondary | ICD-10-CM | POA: Diagnosis not present

## 2017-09-25 DIAGNOSIS — Z86010 Personal history of colonic polyps: Secondary | ICD-10-CM | POA: Diagnosis not present

## 2017-09-25 DIAGNOSIS — I2510 Atherosclerotic heart disease of native coronary artery without angina pectoris: Secondary | ICD-10-CM | POA: Diagnosis not present

## 2017-09-25 DIAGNOSIS — D123 Benign neoplasm of transverse colon: Secondary | ICD-10-CM

## 2017-09-25 MED ORDER — SODIUM CHLORIDE 0.9 % IV SOLN: 500.0000 mL | Freq: Once | INTRAVENOUS | Status: DC

## 2017-09-25 NOTE — Progress Notes (Signed)
To recovery, report to RN, VSS. 

## 2017-09-25 NOTE — Progress Notes (Signed)
Called to room to assist during endoscopic procedure.  Patient ID and intended procedure confirmed with present staff. Received instructions for my participation in the procedure from the performing physician.  

## 2017-09-25 NOTE — Patient Instructions (Signed)
Thank you for allowing [place] to care for you today.  Resume Coumadin tomorrow (Wednesday 09/26/2017) at prior dose.  Refer to coumadin Clinic for further adjustment therapy.  Handout given for Polyps     YOU HAD AN ENDOSCOPIC PROCEDURE TODAY AT [facility]:   Refer to the procedure report that was given to you for any specific questions about what was found during the examination.  If the procedure report does not answer your questions, please call your gastroenterologist to clarify.  If you requested that your care partner not be given the details of your procedure findings, then the procedure report has been included in a sealed envelope for you to review at your convenience later.  YOU SHOULD EXPECT: Some feelings of bloating in the abdomen. Passage of more gas than usual.  Walking can help get rid of the air that was put into your GI tract during the procedure and reduce the bloating. If you had a lower endoscopy (such as a colonoscopy or flexible sigmoidoscopy) you may notice spotting of blood in your stool or on the toilet paper. If you underwent a bowel prep for your procedure, you may not have a normal bowel movement for a few days.  Please Note:  You might notice some irritation and congestion in your nose or some drainage.  This is from the oxygen used during your procedure.  There is no need for concern and it should clear up in a day or so.  SYMPTOMS TO REPORT IMMEDIATELY:   Following lower endoscopy (colonoscopy or flexible sigmoidoscopy):  Excessive amounts of blood in the stool  Significant tenderness or worsening of abdominal pains  Swelling of the abdomen that is new, acute  Fever of 100F or higher   For urgent or emergent issues, a gastroenterologist can be reached at any hour by calling [phone].   DIET:  We do recommend a small meal at first, but then you may proceed to your regular diet.  Drink plenty of fluids but you should avoid alcoholic  beverages for 24 hours.  ACTIVITY:  You should plan to take it easy for the rest of today and you should NOT DRIVE or use heavy machinery until tomorrow (because of the sedation medicines used during the test).    FOLLOW UP: Our staff will call the number listed on your records the next business day following your procedure to check on you and address any questions or concerns that you may have regarding the information given to you following your procedure. If we do not reach you, we will leave a message.  However, if you are feeling well and you are not experiencing any problems, there is no need to return our call.  We will assume that you have returned to your regular daily activities without incident.  If any biopsies were taken you will be contacted by phone or by letter within the next 1-3 weeks.  Please call us at [phone] if you have not heard about the biopsies in 3 weeks.    SIGNATURES/CONFIDENTIALITY: You and/or your care partner have signed paperwork which will be entered into your electronic medical record.  These signatures attest to the fact that that the information above on your After Visit Summary has been reviewed and is understood.  Full responsibility of the confidentiality of this discharge information lies with you and/or your care-partner.

## 2017-09-25 NOTE — Op Note (Signed)
[facility] Patient Name: Rodney Friedman Procedure Date: 09/25/2017 9:30 AM MRN: [id number] Endoscopist: Mallie Mussel L. Loletha Carrow , MD Age: 72 Referring MD:  Date of Birth: 1945-10-28 Gender: Male Account #: [id number] Procedure:                Colonoscopy Indications:              Surveillance: Personal history of adenomatous                            polyps on last colonoscopy 5 years ago (77mm TA                            April 2014) Medicines:                Monitored Anesthesia Care Procedure:                Pre-Anesthesia Assessment:                           - Prior to the procedure, a History and Physical                            was performed, and patient medications and                            allergies were reviewed. The patient's tolerance of                            previous anesthesia was also reviewed. The risks                            and benefits of the procedure and the sedation                            options and risks were discussed with the patient.                            All questions were answered, and informed consent                            was obtained. Prior Anticoagulants: The patient has                            taken Coumadin (warfarin), last dose was 10 days                            prior to procedure (INR was elevated, and                            coordinated by anti-coagulation clinic). ASA Grade                            Assessment: III - A patient with severe systemic  disease. After reviewing the risks and benefits,                            the patient was deemed in satisfactory condition to                            undergo the procedure.                           After obtaining informed consent, the colonoscope                            was passed under direct vision. Throughout the                            procedure, the patient's blood pressure, pulse, and                             oxygen saturations were monitored continuously. The                            Model CF-HQ190L [id number]) scope was introduced                            through the anus and advanced to the the cecum,                            identified by appendiceal orifice and ileocecal                            valve. The colonoscopy was performed without                            difficulty. The patient tolerated the procedure                            well. The quality of the bowel preparation was                            good. The ileocecal valve, appendiceal orifice, and                            rectum were photographed. The quality of the bowel                            preparation was evaluated using the BBPS [facility]                            Bowel Preparation Scale) with scores of: Right                            Colon = 2, Transverse Colon = 2 and Left Colon = 2.  The total BBPS score equals 6. The bowel                            preparation used was GoLYTELY. Scope In: 9:36:13 AM Scope Out: 9:52:22 AM Scope Withdrawal Time: 0 hours 11 minutes 6 seconds  Total Procedure Duration: 0 hours 16 minutes 9 seconds  Findings:                 The perianal and digital rectal examinations were                            normal.                           One small angioectasia was found in the proximal                            ascending colon.                           A 6-8 mm polyp was found in the mid transverse                            colon. The polyp was sessile. The polyp was removed                            with a cold snare. Resection and retrieval were                            complete.                           Multiple diverticula were found from sigmoid to                            hepatic flexure.                           The exam was otherwise without abnormality on                            direct and retroflexion  views. Complications:            No immediate complications. Estimated Blood Loss:     Estimated blood loss was minimal. Impression:               - One 6-8 mm polyp in the mid transverse colon,                            removed with a cold snare. Resected and retrieved.                           - Diverticulosis from sigmoid to hepatic flexure.                           - The examination was otherwise normal on direct  and retroflexion views. Recommendation:           - Patient has a contact number available for                            emergencies. The signs and symptoms of potential                            delayed complications were discussed with the                            patient. Return to normal activities tomorrow.                            Written discharge instructions were provided to the                            patient.                           - Resume previous diet.                           - Resume Coumadin (warfarin) at prior dose                            tomorrow. Refer to Coumadin Clinic for further                            adjustment of therapy.                           - Await pathology results.                           - Repeat colonoscopy in 5 years. Even if polyp is                            not adenomatous or sessile serrated polyps, patient                            has a FAMILY HISTORY OF COLON CANCER IN MOTHER. Zamauri Nez L. Loletha Carrow, MD 09/25/2017 9:59:30 AM This report has been signed electronically.

## 2017-09-26 ENCOUNTER — Encounter (HOSPITAL_COMMUNITY): Payer: Self-pay | Admitting: Licensed Clinical Social Worker

## 2017-09-26 ENCOUNTER — Telehealth: Payer: Self-pay | Admitting: *Deleted

## 2017-09-26 ENCOUNTER — Ambulatory Visit (INDEPENDENT_AMBULATORY_CARE_PROVIDER_SITE_OTHER): Payer: Medicare Other | Admitting: Licensed Clinical Social Worker

## 2017-09-26 DIAGNOSIS — F331 Major depressive disorder, recurrent, moderate: Secondary | ICD-10-CM

## 2017-09-26 NOTE — Progress Notes (Signed)
   THERAPIST PROGRESS NOTE  Session Time: 8:00am-9:00am  Participation Level: Active  Behavioral Response: Well GroomedAlertAnxious and Depressed  Type of Therapy: Individual Therapy  Treatment Goals addressed: Improve psychiatric symptoms, elevate mood (decreased irritability, increased enjoyment of activities), Improve unhelpful thought patterns, emotional regulation skills (reduce temper outburst), Interpersonal relationship skills  Interventions: Motivational Interviewing and Other: Grounding and Mindfulness techniques  Summary: Rodney Friedman is a 72 y.o. male who presents with Major Depressive Disorder, recurrent, moderate.  Suicidal/Homicidal: No without intent/plan  Therapist Response:   [name] met with clinician for individual therapy. [name] discussed his psychiatric symptoms and current life events. He shared his thoughts and feelings related to completing a routine procedure that he'd been putting off for over a year.  Rodney Friedman shared that he felt empowered after he'd completed the procedure and that he was a little upset with himself for having let the anxiety take over and prevent him from scheduling the appointment.  He discussed the value of having insight to his mental health condition which allows him to challenge the negative thoughts when they come and to engage in reality testing.  Clinician used CBT and MI OARS to affirm Rodney Friedman's ability to challenge negative thoughts and encouraged him to continue to do so.  Clinician utilized MI OARS to assist Rodney Friedman with the exploration of his feelings about not being able to control the actions of his grandchildren's legal guardian, affirmed Rodney Friedman's decision to accept that he has no control over the caregiver's actions or the judicial process, and validated that he is going above and beyond by sending money to help support his grandchildren.  Clinician facilitated a mindfulness exercise and Rodney Friedman admitted that he felt a sense of peace come over him  when he participated in the exercise.  Clinician gave Rodney Friedman a mindfulness worksheet and encouraged him to use it as a coping mechanism for his anxiety.    Plan: Return again in 2-3 weeks.  Diagnosis:     Axis I: Major Depressive Disorder, recurrent, moderate  [name], LCSW 09/26/2017

## 2017-09-26 NOTE — Telephone Encounter (Signed)
No answer, left message to call if questions or concerns. 

## 2017-09-26 NOTE — Telephone Encounter (Signed)
  Follow up Call-  Call back number 09/25/2017  Post procedure Call Back phone  # [phone]  Permission to leave phone message Yes  Some recent data might be hidden     Patient questions:  Do you have a fever, pain , or abdominal swelling? No. Pain Score  0 *  Have you tolerated food without any problems? Yes.    Have you been able to return to your normal activities? Yes.    Do you have any questions about your discharge instructions: Diet   No. Medications  No. Follow up visit  No.  Do you have questions or concerns about your Care? No.  Actions: * If pain score is 4 or above: No action needed, pain <4.

## 2017-10-02 ENCOUNTER — Encounter: Payer: Self-pay | Admitting: Gastroenterology

## 2017-10-08 ENCOUNTER — Ambulatory Visit (INDEPENDENT_AMBULATORY_CARE_PROVIDER_SITE_OTHER): Payer: Medicare Other | Admitting: Pharmacist

## 2017-10-08 DIAGNOSIS — Z7901 Long term (current) use of anticoagulants: Secondary | ICD-10-CM | POA: Diagnosis not present

## 2017-10-08 DIAGNOSIS — I480 Paroxysmal atrial fibrillation: Secondary | ICD-10-CM | POA: Diagnosis not present

## 2017-10-08 DIAGNOSIS — I482 Chronic atrial fibrillation, unspecified: Secondary | ICD-10-CM

## 2017-10-08 LAB — POCT INR: INR: 2.9

## 2017-10-09 DIAGNOSIS — C61 Malignant neoplasm of prostate: Secondary | ICD-10-CM | POA: Diagnosis not present

## 2017-10-10 ENCOUNTER — Encounter (HOSPITAL_COMMUNITY): Payer: Self-pay | Admitting: Licensed Clinical Social Worker

## 2017-10-10 ENCOUNTER — Ambulatory Visit (INDEPENDENT_AMBULATORY_CARE_PROVIDER_SITE_OTHER): Payer: Medicare Other | Admitting: Licensed Clinical Social Worker

## 2017-10-10 DIAGNOSIS — F331 Major depressive disorder, recurrent, moderate: Secondary | ICD-10-CM | POA: Diagnosis not present

## 2017-10-10 NOTE — Progress Notes (Signed)
   THERAPIST PROGRESS NOTE  Session Time: 9:05am-10:15am  Participation Level: Active  Behavioral Response: Well GroomedAlertDepressed  Type of Therapy: Individual Therapy  Treatment Goals addressed: Improve psychiatric symptoms, elevate mood (decreased irritability, increased enjoyment of activities), Improve unhelpful thought patterns, emotional regulation skills (reduce temper outburst), Interpersonal relationship skills  Interventions: Motivational Interviewing and Other: Grounding and Mindfulness techniques  Summary: Rodney Friedman is a 72 y.o. male who presents with Bipolar 1 Disorder, Most recent episode depressed  Suicidal/Homicidal: No without intent/plan  Therapist Response:  Rodney Friedman met with clinician for individual therapy. Rodney Friedman discussed his psychiatric symptoms and current life events. Rodney Friedman shared his feelings about prostate cancer. Clinician utilized MI OARS to reflect and summarize thoughts and feelings. Clinician discussed the power of mindfulness and identified the importance of his decision to be well and to direct his white blood cells to attack the cancer.  Rodney Friedman also processed some of his experiences and feelings about his mother. Clinician reflected and summarized the emotional responses (guilt, anger, resentment) and explored reasons for these presentations. Clinician discussed the value of acceptance, rather than focusing on forgiveness, as a path toward inner peace. Clinician also processed Rodney Friedman own parenting and his ability to make peace with his good and bad choices. Rodney Friedman was able to process his feelings about his children and noted some improvement in relationship with daughter in particular. He identified some pride in her ability to maintain 100 days sobriety.   Plan: Return again in 1-2 weeks.  Diagnosis:     Axis I: Major Depressive Disorder, recurrent, moderate  [name], LCSW 10/10/2017

## 2017-10-25 ENCOUNTER — Ambulatory Visit (HOSPITAL_COMMUNITY): Payer: Self-pay | Admitting: Licensed Clinical Social Worker

## 2017-10-31 DIAGNOSIS — H401122 Primary open-angle glaucoma, left eye, moderate stage: Secondary | ICD-10-CM | POA: Diagnosis not present

## 2017-10-31 DIAGNOSIS — H401114 Primary open-angle glaucoma, right eye, indeterminate stage: Secondary | ICD-10-CM | POA: Diagnosis not present

## 2017-11-02 ENCOUNTER — Ambulatory Visit (INDEPENDENT_AMBULATORY_CARE_PROVIDER_SITE_OTHER): Payer: Medicare Other | Admitting: Family Medicine

## 2017-11-02 ENCOUNTER — Encounter: Payer: Self-pay | Admitting: Family Medicine

## 2017-11-02 VITALS — BP 138/54 | HR 61 | Temp 97.6°F | Resp 17 | Ht 70.5 in | Wt 225.0 lb

## 2017-11-02 DIAGNOSIS — I10 Essential (primary) hypertension: Secondary | ICD-10-CM | POA: Diagnosis not present

## 2017-11-02 DIAGNOSIS — C9110 Chronic lymphocytic leukemia of B-cell type not having achieved remission: Secondary | ICD-10-CM

## 2017-11-02 DIAGNOSIS — E785 Hyperlipidemia, unspecified: Secondary | ICD-10-CM

## 2017-11-02 DIAGNOSIS — C9190 Lymphoid leukemia, unspecified not having achieved remission: Secondary | ICD-10-CM

## 2017-11-02 DIAGNOSIS — E119 Type 2 diabetes mellitus without complications: Secondary | ICD-10-CM | POA: Diagnosis not present

## 2017-11-02 MED ORDER — METFORMIN HCL 500 MG PO TABS: 500.0000 mg | ORAL_TABLET | Freq: Every evening | ORAL | 1 refills | Status: DC

## 2017-11-02 MED ORDER — AMLODIPINE BESYLATE 10 MG PO TABS: 10.0000 mg | ORAL_TABLET | Freq: Every day | ORAL | 1 refills | Status: DC

## 2017-11-02 MED ORDER — ROSUVASTATIN CALCIUM 20 MG PO TABS: 20.0000 mg | ORAL_TABLET | Freq: Every day | ORAL | 1 refills | Status: DC

## 2017-11-02 MED ORDER — METOPROLOL SUCCINATE ER 100 MG PO TB24: 100.0000 mg | ORAL_TABLET | Freq: Every day | ORAL | 1 refills | Status: DC

## 2017-11-02 MED ORDER — VALSARTAN 320 MG PO TABS: ORAL_TABLET | ORAL | 2 refills | Status: DC

## 2017-11-02 NOTE — Progress Notes (Signed)
Subjective:  By signing my name below, I, [name], attest that this documentation has been prepared under the direction and in the presence of [name], MD. Electronically Signed: [name], [place]. 11/02/2017 , 8:43 AM .  Patient was seen in Room 3 .   Patient ID: Rodney Friedman, male    DOB: [birth year], 72 y.o.   MRN: [id number] Chief Complaint  Patient presents with  . Follow-up  . Medication Refill    norvasc wellbutrin glucophage toprol diovan coumadin    HPI Rodney Friedman is a 72 y.o. male Here for follow up. He is fasting today.   History of prostate cancer He has a history of prostate cancer. He is followed by Dr. Karsten Ro, next appointment in August with 6 month follow up. His last PSA was checked 2-3 months ago, and received a Lupron shot in February. His PSA was elevated to 7.21. He was diagnosed in 2014, treated with radiation therapy and hormonal treatment; currently on Lupron.   Diabetes He was last seen in January.   Lab Results  Component Value Date   HGBA1C 6.6 (H) 06/18/2017   He is on metformin 500 mg qd; denies any complications.   Urine micro albumin: Sept 2018.  Foot exam: done today, last one in June 2018.  Optho: Recently seen by Dr. Andria Frames with [facility] on 10/31/17. He had laser surgery in his eyes done by Dr. Susa Simmonds 2 months ago.   History of CLL Patient is followed by Dr. Alvy Bimler; no significant change in WBC in the past few years and no specific treatment recommended. He was seen by Dr. Alen Blew once for alternative care.   Lab Results  Component Value Date   WBC 16.9 (H) 06/18/2017   HGB 15.7 06/18/2017   HCT 46.5 06/18/2017   MCV 92 06/18/2017   PLT 199 06/18/2017    HTN BP Readings from Last 3 Encounters:  11/02/17 (!) 148/66  09/25/17 (!) 129/53  07/31/17 (!) 128/57   He takes norvasc 10 mg, metoprolol 100 mg, valsartan 320 mg qd. He denies chest pain, shortness of breath, lightheadedness or dizziness.    Hyperlipidemia Lab Results  Component Value Date   CHOL 131 02/08/2017   HDL 27 (L) 02/08/2017   LDLCALC 61 02/08/2017   TRIG 213 (H) 02/08/2017   CHOLHDL 4.9 02/08/2017   Lab Results  Component Value Date   ALT 20 02/08/2017   AST 19 02/08/2017   ALKPHOS 37 (L) 02/08/2017   BILITOT 0.3 02/08/2017   He takes Crestor 20 mg qd.   Depression He is followed by Dr. Casimiro Needle. He is on Wellbutrin.   Afib He is on beta blocker for rate control and coumadin. His cardiologist is Dr. Sallyanne Kuster.   Patient Active Problem List   Diagnosis Date Noted  . Paroxysmal atrial fibrillation ([place]) 04/21/2016  . Diabetes mellitus ([place]) 04/21/2016  . OSA (obstructive sleep apnea) 04/21/2016  . Quality of life palliative care encounter 01/03/2016  . Bilateral carotid bruits without stenosis 01/02/2016  . Tremor, essential 09/22/2015  . Glaucoma, open angle 06/16/2015  . 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  . Pseudoaphakia 06/15/2015  . CD (contact dermatitis) 04/29/2014  . 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 Hematuria 01/05/2014  . Skin lesion 01/05/2014  . Mild aortic valve stenosis 01/03/2014  . Hypertensive heart disease 01/03/2014  . Mixed hyperlipidemia 01/03/2014  . Mild obesity 01/03/2014  . Tobacco abuse 01/03/2014  . Chronic atrial fibrillation ([place]) 12/31/2013  . Long term  current use of anticoagulant therapy 12/31/2013  . Cataract, nuclear 10/06/2013  . Dermatochalasis of eyelid 10/06/2013  . 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 CAD (coronary artery disease) 09/03/2013  . Pre-diabetes 09/03/2013  . CLL (chronic lymphocytic leukemia) ([place]) 07/08/2013  . Prostate cancer ([place]) 07/08/2013   Past Medical History:  Diagnosis Date  . Adenomatous colon polyp   . Alcoholism ([place])   . Anxiety   . Atrial fibrillation ([place])   . CAD (coronary artery disease)   . Cataract    removed left eye   . 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 Depression     . Diabetes mellitus without complication ([place])   . Diverticulosis   . Eye abnormality    Macular scarring R eye  . Glaucoma   . Heart murmur   . HLD (hyperlipidemia)   . Hypertension   . Leukemia ([place])    CLL  . Myocardial infarction ([place]) 2006  . Prostate cancer ([place]) 07/08/2013   [facility]- getting Lupron shot every 6 months - traces in prostate and 2 lymphnodes left hip- non focused traces per pt   . Sleep apnea   . Substance abuse ([place])   . 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 Arm Surgery Right    from door accident with glass  . CATARACT EXTRACTION W/ INTRAOCULAR LENS IMPLANT Left   . COLONOSCOPY    . 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 POLYPECTOMY     Allergies  Allergen Reactions  . Other Rash    Allergen: "Plants and bushes while doing yard work"   Prior to Admission medications   Medication Sig Start Date End Date Taking? Authorizing Provider  amLODipine (NORVASC) 10 MG tablet Take 1 tablet (10 mg total) by mouth daily. 06/18/17   Wendie Agreste, MD  B Complex Vitamins (VITAMIN-B COMPLEX PO) Take by mouth.    [provider]  BIOTIN PO Take by mouth.    [provider]  buPROPion (WELLBUTRIN XL) 150 MG 24 hr tablet 1  qam 06/22/17   Plovsky, Berneta Sages, MD  Calcium Carbonate-Vit D-Min (CALCIUM 1200 PO) Take 1,200 mg by mouth daily.    [provider]  Docusate Calcium (STOOL SOFTENER PO) Take 1 tablet as needed by mouth.    [provider]  ketoconazole (NIZORAL) 2 % shampoo Apply 1 application topically 2 (two) times a week. 06/18/17   Wendie Agreste, MD  latanoprost (XALATAN) 0.005 % ophthalmic solution Once a day every second day 06/30/13   [provider]  LYCOPENE PO Take 8 mg by mouth daily.    [provider]  metFORMIN (GLUCOPHAGE) 500 MG tablet Take 1 tablet (500 mg total) by mouth every evening. 06/18/17   Wendie Agreste, MD  metoprolol  succinate (TOPROL-XL) 100 MG 24 hr tablet Take 1 tablet (100 mg total) by mouth daily. Take with or immediately following a meal. 06/18/17   Wendie Agreste, MD  Omega-3 Fatty Acids (FISH OIL) 1000 MG CAPS Take 1,000 mg by mouth daily.    [provider]  rosuvastatin (CRESTOR) 20 MG tablet Take 1 tablet (20 mg total) by mouth daily. 06/18/17   Wendie Agreste, MD  valsartan (DIOVAN) 320 MG tablet TAKE 1 TABLET BY MOUTH EVERY DAY 07/11/17   Croitoru, Mihai, MD  vitamin B-12 (CYANOCOBALAMIN) 500 MCG tablet Take 500 mcg by mouth daily.    [provider]  vitamin C (ASCORBIC ACID) 500 MG tablet Take 500 mg by mouth daily.    [provider]  warfarin (COUMADIN) 4 MG tablet TAKE 1 TO 1 & 1/2 TABLETS BY MOUTH EVERY DAY AS DIRECTED BY COUMADIN CLINIC 04/09/17   Croitoru, Dani Gobble, MD   Social History   Socioeconomic History  . Marital status: Divorced    Spouse name: Not on file  . Number of children: 2  . Years of education: 67  . Highest education level: Not on file  Occupational History  . Occupation: Retired Tour manager  . Financial resource strain: Not on file  . Food insecurity:    Worry: Not on file    Inability: Not on file  . Transportation needs:    Medical: Not on file    Non-medical: Not on file  Tobacco Use  . Smoking status: Current Every Day Smoker    Packs/day: 0.50    Years: 50.00    Pack years: 25.00    Types: Cigarettes  . Smokeless tobacco: Never Used  Substance and Sexual Activity  . Alcohol use: No    Alcohol/week: 0.0 oz  . Drug use: No  . Sexual activity: Not Currently  Lifestyle  . Physical activity:    Days per week: Not on file    Minutes per session: Not on file  . Stress: Not on file  Relationships  . Social connections:    Talks on phone: Not on file    Gets together: Not on file    Attends religious service: Not on file    Active member of club or organization: Not on file    Attends meetings of clubs or  organizations: Not on file    Relationship status: Not on file  . Intimate partner violence:    Fear of current or ex partner: Not on file    Emotionally abused: Not on file    Physically abused: Not on file    Forced sexual activity: Not on file  Other Topics Concern  . Not on file  Social History Narrative   Lives at home w/ his father   Divorced   Right-handed   Education: College   No caffeine   Review of Systems  Constitutional: Negative for fatigue and unexpected weight change.  Eyes: Negative for visual disturbance.  Respiratory: Negative for cough, chest tightness and shortness of breath.   Cardiovascular: Negative for chest pain, palpitations and leg swelling.  Gastrointestinal: Negative for abdominal pain and blood in stool.  Neurological: Negative for dizziness, light-headedness and headaches.  Psychiatric/Behavioral: The patient is nervous/anxious.        Objective:   Physical Exam  Constitutional: He is oriented to person, place, and time. He appears well-developed and well-nourished.  HENT:  Head: Normocephalic and atraumatic.  Eyes: Pupils are equal, round, and reactive to light. EOM are normal.  Neck: No JVD present. Carotid bruit is not present.  Cardiovascular: Normal rate, regular rhythm and normal heart sounds.  No murmur heard. Pulmonary/Chest: Effort normal and breath sounds normal. He has no rales.  Musculoskeletal: He exhibits no edema.  stasis changes at the ankle, non pitting edema; cap refill <1 second in his great toes, great toes with thickened discolored toenails, no ingrown toenails  Neurological: He is alert and oriented to person, place, and time.  Skin: Skin is warm and dry.  Psychiatric: He has a normal mood and affect.  Vitals reviewed.   Vitals:   11/02/17 2694 11/02/17 8546  BP: (!) 148/66 (!) 138/54  Pulse: 61   Resp: 17   Temp: 97.6 F (36.4 C)   TempSrc: Oral   SpO2: 98%   Weight: 225 lb (102.1 kg)   Height: 5' 10.5"  (1.791 m)    Diabetic Foot Exam - Simple   Simple Foot Form Diabetic Foot exam was performed with the following findings:  Yes 11/02/2017  8:54 AM  Visual Inspection No deformities, no ulcerations, no other skin breakdown bilaterally:  Yes Sensation Testing Intact to touch and monofilament testing bilaterally:  Yes Pulse Check Posterior Tibialis and Dorsalis pulse intact bilaterally:  Yes Comments        Assessment & Plan:   Rodney Friedman is a 72 y.o. male CLL (chronic lymphocytic leukemia) ([place]) - Plan: CBC  -Follow-up with hematology, he requests CBC.  Previously stable off any specific treatments.  If increasing WBC, can discuss further with hematology  Type 2 diabetes mellitus without complication, without long-term current use of insulin (HCC) - Plan: Hemoglobin A1c, metFORMIN (GLUCOPHAGE) 500 MG tablet  -Tolerating metformin, continue same dose, check A1c, foot exam updated.  Did note some thickened great toenails, slightly warm.  Nail care discussed and option of podiatry if needed.  Essential hypertension - Plan: Comprehensive metabolic panel, valsartan (DIOVAN) 320 MG tablet, metoprolol succinate (TOPROL-XL) 100 MG 24 hr tablet, amLODipine (NORVASC) 10 MG tablet  -Stable on recheck, continue same doses, labs pending  Hyperlipidemia, unspecified hyperlipidemia type - Plan: Comprehensive metabolic panel, Lipid panel  -Tolerating Crestor, continue same dose.  Has planned follow-up with his urologist regarding prostate cancer treatment.  Reportedly had significant improvement in PSA after Lupron shot.  It appears that follow-up will be in the next 2 months, PSA was deferred at this time.  Meds ordered this encounter  Medications  . metFORMIN (GLUCOPHAGE) 500 MG tablet    Sig: Take 1 tablet (500 mg total) by mouth every evening.    Dispense:  90 tablet    Refill:  1  . valsartan (DIOVAN) 320 MG tablet    Sig: TAKE 1 TABLET BY MOUTH EVERY DAY    Dispense:  90 tablet     Refill:  2  . metoprolol succinate (TOPROL-XL) 100 MG 24 hr tablet    Sig: Take 1 tablet (100 mg total) by mouth daily. Take with or immediately following a meal.    Dispense:  90 tablet    Refill:  1  . amLODipine (NORVASC) 10 MG tablet    Sig: Take 1 tablet (10 mg total) by mouth daily.    Dispense:  90 tablet    Refill:  1   Patient Instructions    No change in medications for now.  It appears you are due for follow-up with PSA in a few months at urology.  I did not check that today.  Please discuss refills of Coumadin and monitoring with your cardiologist.  I did check your cholesterol, electrolytes, and blood counts today.  Thank you for coming in today, follow-up in 6 months   IF you received an x-ray today, you will receive an invoice from [facility]. Please contact [facility] at [phone] with questions or concerns regarding your invoice.   IF you received labwork today, you will receive an invoice from [facility]. Please contact [facility] at [phone] with questions or concerns regarding your invoice.   Our billing staff will not be able to assist you with questions regarding bills from these companies.  You will be contacted with  the lab results as soon as they are available. The fastest way to get your results is to activate your My Chart account. Instructions are located on the last page of this paperwork. If you have not heard from [place] regarding the results in 2 weeks, please contact this office.       I personally performed the services described in this documentation, which was scribed in my presence. The recorded information has been reviewed and considered for accuracy and completeness, addended by me as needed, and agree with information above.  Signed,   [name], MD [facility] [place].  11/02/17 9:02 AM

## 2017-11-02 NOTE — Patient Instructions (Addendum)
  No change in medications for now.  It appears you are due for follow-up with PSA in a few months at urology.  I did not check that today.  Please discuss refills of Coumadin and monitoring with your cardiologist.  I did check your cholesterol, electrolytes, and blood counts today.  Thank you for coming in today, follow-up in 6 months   IF you received an x-ray today, you will receive an invoice from [facility]. Please contact [facility] at [phone] with questions or concerns regarding your invoice.   IF you received labwork today, you will receive an invoice from [facility]. Please contact [facility] at [phone] with questions or concerns regarding your invoice.   Our billing staff will not be able to assist you with questions regarding bills from these companies.  You will be contacted with the lab results as soon as they are available. The fastest way to get your results is to activate your My Chart account. Instructions are located on the last page of this paperwork. If you have not heard from [place] regarding the results in 2 weeks, please contact this office.

## 2017-11-03 LAB — HEMOGLOBIN A1C
ESTIMATED AVERAGE GLUCOSE: 134 mg/dL
Hgb A1c MFr Bld: 6.3 % — ABNORMAL HIGH (ref 4.8–5.6)

## 2017-11-03 LAB — COMPREHENSIVE METABOLIC PANEL
ALBUMIN: 4.2 g/dL (ref 3.6–4.8)
ALK PHOS: 40 IU/L (ref 39–117)
ALT: 24 IU/L (ref 0–44)
AST: 17 IU/L (ref 0–40)
Albumin/Globulin Ratio: 2.1 (ref 1.2–2.2)
BILIRUBIN TOTAL: 0.2 mg/dL (ref 0.0–1.2)
BUN / CREAT RATIO: 16 (ref 10–24)
BUN: 15 mg/dL (ref 8–27)
CHLORIDE: 106 mmol/L (ref 96–106)
CO2: 22 mmol/L (ref 20–29)
CREATININE: 0.93 mg/dL (ref 0.76–1.27)
Calcium: 9.3 mg/dL (ref 8.6–10.2)
GFR calc Af Amer: 96 mL/min/{1.73_m2} (ref >59)
GFR calc non Af Amer: 83 mL/min/{1.73_m2} (ref >59)
GLOBULIN, TOTAL: 2 g/dL (ref 1.5–4.5)
Glucose: 125 mg/dL — ABNORMAL HIGH (ref 65–99)
Potassium: 4.4 mmol/L (ref 3.5–5.2)
Sodium: 141 mmol/L (ref 134–144)
Total Protein: 6.2 g/dL (ref 6.0–8.5)

## 2017-11-03 LAB — LIPID PANEL
CHOLESTEROL TOTAL: 150 mg/dL (ref 100–199)
Chol/HDL Ratio: 4.4 ratio (ref 0.0–5.0)
HDL: 34 mg/dL — ABNORMAL LOW (ref >39)
LDL CALC: 61 mg/dL (ref 0–99)
Triglycerides: 273 mg/dL — ABNORMAL HIGH (ref 0–149)
VLDL Cholesterol Cal: 55 mg/dL — ABNORMAL HIGH (ref 5–40)

## 2017-11-03 LAB — CBC
Hematocrit: 40.1 % (ref 37.5–51.0)
Hemoglobin: 13.8 g/dL (ref 13.0–17.7)
MCH: 31.7 pg (ref 26.6–33.0)
MCHC: 34.4 g/dL (ref 31.5–35.7)
MCV: 92 fL (ref 79–97)
Platelets: 242 10*3/uL (ref 150–450)
RBC: 4.35 x10E6/uL (ref 4.14–5.80)
RDW: 14.3 % (ref 12.3–15.4)
WBC: 17.3 10*3/uL — AB (ref 3.4–10.8)

## 2017-11-06 ENCOUNTER — Ambulatory Visit: Payer: Medicare Other | Admitting: Family Medicine

## 2017-11-07 ENCOUNTER — Encounter (HOSPITAL_COMMUNITY): Payer: Self-pay | Admitting: Licensed Clinical Social Worker

## 2017-11-07 ENCOUNTER — Ambulatory Visit (INDEPENDENT_AMBULATORY_CARE_PROVIDER_SITE_OTHER): Payer: Medicare Other | Admitting: Licensed Clinical Social Worker

## 2017-11-07 DIAGNOSIS — F321 Major depressive disorder, single episode, moderate: Secondary | ICD-10-CM | POA: Diagnosis not present

## 2017-11-07 DIAGNOSIS — F411 Generalized anxiety disorder: Secondary | ICD-10-CM

## 2017-11-07 DIAGNOSIS — F331 Major depressive disorder, recurrent, moderate: Secondary | ICD-10-CM

## 2017-11-07 NOTE — Progress Notes (Signed)
   THERAPIST PROGRESS NOTE  Session Time: 9:00am-10:00am  Participation Level: Active  Behavioral Response: Well GroomedAlertAnxious and Depressed  Type of Therapy: Individual Therapy  Treatment Goals addressed: Improve psychiatric symptoms, elevate mood (decreased irritability, increased enjoyment of activities), Improve unhelpful thought patterns, emotional regulation skills (reduce temper outburst), Interpersonal relationship skills  Interventions: Motivational Interviewing and Other: Grounding and Mindfulness techniques  Summary: Nyshawn Gowdy is a 72 y.o. male who presents with Major Depressive Disorder, moderate and Generalized Anxiety Disorder  Suicidal/Homicidal: No without intent/plan  Therapist Response:  [name] met with clinician for individual therapy. Chaz discussed his psychiatric symptoms and current life events. Devonte shared increased feeling of helplessness due to concerns about cancer diagnosis and worries about his grandchildren in [place]. Clinician utilized MI OARS to reflect and summarize thoughts and feelings. Clinician discussed use of serenity prayer, which Benedict does not like. Clinician also identified the amount of things he does for everyone and not for himself. Jojuan reported that he gifted himself time and money to play slot machines.  Jasean processed interactions and relationship with daughter. He identified the steps of AA which she is in and is waiting for step 10: Amends. However, he also identified frustration that she does not take responsibility for her decisions to prostitute, leave her children, etc. Clinician provided supportive therapy and again reminded him of what he can control and what he cannot.  Clinician added diagnosis anxiety due to increased anxiety, racing thoughts, obsessive patterns.   Plan: Return again in 1-2 weeks.  Diagnosis:     Axis I: Major depressive disorder, moderate and Generalized Anxiety Disorder  [name],  LCSW 11/07/2017

## 2017-11-08 ENCOUNTER — Encounter: Payer: Self-pay | Admitting: Family Medicine

## 2017-11-19 ENCOUNTER — Ambulatory Visit: Payer: Medicare Other | Admitting: Pharmacist Clinician (PhC)/ Clinical Pharmacy Specialist

## 2017-11-19 DIAGNOSIS — E669 Obesity, unspecified: Secondary | ICD-10-CM

## 2017-11-19 DIAGNOSIS — Z7901 Long term (current) use of anticoagulants: Secondary | ICD-10-CM

## 2017-11-19 DIAGNOSIS — I480 Paroxysmal atrial fibrillation: Secondary | ICD-10-CM

## 2017-11-19 DIAGNOSIS — I482 Chronic atrial fibrillation, unspecified: Secondary | ICD-10-CM

## 2017-11-19 LAB — POCT INR: INR: 3.3 — AB (ref 2.0–3.0)

## 2017-11-21 ENCOUNTER — Encounter (HOSPITAL_COMMUNITY): Payer: Self-pay | Admitting: Licensed Clinical Social Worker

## 2017-11-21 ENCOUNTER — Ambulatory Visit (INDEPENDENT_AMBULATORY_CARE_PROVIDER_SITE_OTHER): Payer: Medicare Other | Admitting: Licensed Clinical Social Worker

## 2017-11-21 DIAGNOSIS — F331 Major depressive disorder, recurrent, moderate: Secondary | ICD-10-CM

## 2017-11-21 DIAGNOSIS — F411 Generalized anxiety disorder: Secondary | ICD-10-CM | POA: Diagnosis not present

## 2017-11-21 NOTE — Progress Notes (Signed)
   THERAPIST PROGRESS NOTE  Session Time: 9:00am-10:00am  Participation Level: Active  Behavioral Response: Well GroomedAlertAnxious and Depressed  Type of Therapy: Individual Therapy  Treatment Goals addressed: Improve psychiatric symptoms, elevate mood (decreased irritability, increased enjoyment of activities), Improve unhelpful thought patterns, emotional regulation skills (reduce temper outburst), Interpersonal relationship skills  Interventions: Motivational Interviewing and Other: Grounding and Mindfulness techniques  Summary: Rodney Friedman is a 72 y.o. male who presents with Major Depressive Disorder, moderate and Generalized Anxiety Disorder  Suicidal/Homicidal: No without intent/plan  Therapist Response:  [name] met with clinician for individual therapy. Rodney Friedman discussed his psychiatric symptoms and current life events. Rodney Friedman shared some positive thoughts about progress in his family. He identified that his granddaughter was successfully adopted and now is "free from [place]". However, he identified ongoing stress about his grandchildren moving up to [facility] and being able to be close to him. Rodney Friedman processed thoughts and feelings about his grandchildren, including how he has financially supported them. Clinician reflected the dedication he has to his family. Rodney Friedman also discussed the plan he has for his granddaughter, to move to [facility] and attend [employer] for college. Clinician explored how his plan and his granddaughter's plan may differ. Clinician explored ways for Rodney Friedman to "win over" his granddaughter by bringing her up for a few days to see where he lives and the [place]. Clinician also provided reality testing about how teenagers act nowadays and to lower some of his expectations about her behaviors.  Clinician identified ongoing anxiety, obsessive thoughts, and need for control. Clinician encouraged Rodney Friedman to discuss this at his next appointment with Dr. Casimiro Needle for a possible medication  adjustment.   Plan: Return again in 1-2 weeks.  Diagnosis:     Axis I: Major depressive disorder, moderate and Generalized Anxiety Disorder   [name], LCSW 11/21/2017

## 2017-11-23 ENCOUNTER — Ambulatory Visit (INDEPENDENT_AMBULATORY_CARE_PROVIDER_SITE_OTHER): Payer: Medicare Other | Admitting: Psychiatry

## 2017-11-23 ENCOUNTER — Encounter (HOSPITAL_COMMUNITY): Payer: Self-pay | Admitting: Psychiatry

## 2017-11-23 VITALS — BP 153/61 | HR 63 | Ht 70.5 in | Wt 229.0 lb

## 2017-11-23 DIAGNOSIS — F325 Major depressive disorder, single episode, in full remission: Secondary | ICD-10-CM

## 2017-11-23 MED ORDER — BUPROPION HCL ER (XL) 300 MG PO TB24: ORAL_TABLET | ORAL | 5 refills | Status: DC

## 2017-11-23 NOTE — Progress Notes (Signed)
Psychiatric Initial Adult Assessment   Patient Identification: Tou Hayner MRN:  [id number] Date of Evaluation:  11/23/2017 Referral Source: Dr. Nyoka Cowden Chief Complaint: Need follow-up care   Visit Diagnosis: Bipolar disorder, substance use disorder No diagnosis found.  History of Present Illness: Today the patient is doing fairly well.his Prograf taking Lupron or his prostate cancer. He's had this response. His PSA from 7 down to less than 1. He also has a second opinion from an oncologist was offered him the consideration of intense chemotherapy and talks about just increasing his longevity 545 months. These are of course to different positions. Alleviating with both of these physicians in the next month. At this time the patient is stressed from a lot of different circumstances. Is very insightful that he knows is due to specific stresses. Prostate cancer is probably the biggest one but at this time seems relatively stable. He also has the issue of 75 to his grandchildren and how it is causing a lot of financial stress. This brings up conflict with his father. The patient also is affected by the political environment.Overall the patient denies obsessive compulsive symptomatology. He really does not have obsessive thinking he has more fixation 1 meaningful anxiety provoking topics. He thinks the circumstances are significant anxiety was well. It is noted that he is taking a below therapeutic dose of Wellbutrin.The patient denies being depressed. He denies being. Generally sleeping and eating fairly well. He's got good energy he is getting frustrated doing small things that he feels overwhelmed with some major stress. At this time the patient is very committed to his therapy.  Previous Psychotropic Medications: Lamictal 300 mg Wellbutrin 150 Substance Abuse History in the last 12 months:  Yes.    Consequences of Substance Abuse: NA  Past Medical History:  Past Medical History:  Diagnosis Date   . Adenomatous colon polyp   . Alcoholism ([place])   . Anxiety   . Atrial fibrillation ([place])   . CAD (coronary artery disease)   . Cataract    removed left eye   . 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 Depression   . Diabetes mellitus without complication ([place])   . Diverticulosis   . Eye abnormality    Macular scarring R eye  . Glaucoma   . Heart murmur   . HLD (hyperlipidemia)   . Hypertension   . Leukemia ([place])    CLL  . Myocardial infarction ([place]) 2006  . Prostate cancer ([place]) 07/08/2013   [facility] at [facility]- getting Lupron shot every 6 months - traces in prostate and 2 lymphnodes left hip- non focused traces per pt   . Sleep apnea   . Substance abuse ([place])   . 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 Arm Surgery Right    from door accident with glass  . CATARACT EXTRACTION W/ INTRAOCULAR LENS IMPLANT Left   . COLONOSCOPY    . 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 POLYPECTOMY      Family Psychiatric History:   Family History:  Family History  Problem Relation Age of Onset  . Colon cancer Mother   . Ovarian cancer Mother   . Uterine cancer Mother   . Hypertension Father   . Prostate cancer Father   . Colon polyps Neg Hx     Social History:   Social History   Socioeconomic History  . Marital status: Divorced    Spouse name: Not on file  .  Number of children: 2  . Years of education: 63  . Highest education level: Not on file  Occupational History  . Occupation: Retired Tour manager  . Financial resource strain: Not on file  . Food insecurity:    Worry: Not on file    Inability: Not on file  . Transportation needs:    Medical: Not on file    Non-medical: Not on file  Tobacco Use  . Smoking status: Current Every Day Smoker    Packs/day: 0.50    Years: 50.00    Pack years: 25.00    Types: Cigarettes  . Smokeless tobacco: Never Used  Substance and Sexual Activity  .  Alcohol use: No    Alcohol/week: 0.0 oz  . Drug use: No  . Sexual activity: Not Currently  Lifestyle  . Physical activity:    Days per week: Not on file    Minutes per session: Not on file  . Stress: Not on file  Relationships  . Social connections:    Talks on phone: Not on file    Gets together: Not on file    Attends religious service: Not on file    Active member of club or organization: Not on file    Attends meetings of clubs or organizations: Not on file    Relationship status: Not on file  Other Topics Concern  . Not on file  Social History Narrative   Lives at home w/ his father   Divorced   Right-handed   Education: College   No caffeine    Additional Social History:   Allergies:   Allergies  Allergen Reactions  . Other Rash    Allergen: "Plants and bushes while doing yard work"    Metabolic Disorder Labs: Lab Results  Component Value Date   HGBA1C 6.3 (H) 11/02/2017   MPG 117 02/11/2016   MPG 128 (H) 03/22/2015   No results found for: PROLACTIN Lab Results  Component Value Date   CHOL 150 11/02/2017   TRIG 273 (H) 11/02/2017   HDL 34 (L) 11/02/2017   CHOLHDL 4.4 11/02/2017   VLDL 41 (H) 03/10/2016   LDLCALC 61 11/02/2017   LDLCALC 61 02/08/2017     Current Medications: Current Outpatient Medications  Medication Sig Dispense Refill  . amLODipine (NORVASC) 10 MG tablet Take 1 tablet (10 mg total) by mouth daily. 90 tablet 1  . B Complex Vitamins (VITAMIN-B COMPLEX PO) Take by mouth.    Marland Kitchen BIOTIN PO Take by mouth.    Marland Kitchen buPROPion (WELLBUTRIN XL) 300 MG 24 hr tablet 1  qam 30 tablet 5  . Calcium Carbonate-Vit D-Min (CALCIUM 1200 PO) Take 1,200 mg by mouth daily.    Mariane Baumgarten Calcium (STOOL SOFTENER PO) Take 1 tablet as needed by mouth.    Marland Kitchen ketoconazole (NIZORAL) 2 % shampoo Apply 1 application topically 2 (two) times a week. 120 mL 1  . latanoprost (XALATAN) 0.005 % ophthalmic solution Once a day every second day    . LYCOPENE PO Take 8 mg by  mouth daily.    . metFORMIN (GLUCOPHAGE) 500 MG tablet Take 1 tablet (500 mg total) by mouth every evening. 90 tablet 1  . metoprolol succinate (TOPROL-XL) 100 MG 24 hr tablet Take 1 tablet (100 mg total) by mouth daily. Take with or immediately following a meal. 90 tablet 1  . Omega-3 Fatty Acids (FISH OIL) 1000 MG CAPS Take 1,000 mg by mouth daily.    . rosuvastatin (CRESTOR)  20 MG tablet Take 1 tablet (20 mg total) by mouth daily. 90 tablet 1  . valsartan (DIOVAN) 320 MG tablet TAKE 1 TABLET BY MOUTH EVERY DAY 90 tablet 2  . vitamin B-12 (CYANOCOBALAMIN) 500 MCG tablet Take 500 mcg by mouth daily.    . vitamin C (ASCORBIC ACID) 500 MG tablet Take 500 mg by mouth daily.    Marland Kitchen warfarin (COUMADIN) 4 MG tablet TAKE 1 TO 1 & 1/2 TABLETS BY MOUTH EVERY DAY AS DIRECTED BY COUMADIN CLINIC 120 tablet 0   Current Facility-Administered Medications  Medication Dose Route Frequency Provider Last Rate Last Dose  . 0.9 %  sodium chloride infusion  500 mL Intravenous Once Doran Stabler, MD        Neurologic: Headache: No Seizure: No Paresthesias:No  Musculoskeletal: Strength & Muscle Tone: within normal limits Gait & Station: normal Patient leans: Right  Psychiatric Specialty Exam: ROS  Blood pressure (!) 153/61, pulse 63, height 5' 10.5" (1.791 m), weight 229 lb (103.9 kg), SpO2 97 %.Body mass index is 32.39 kg/m.  General Appearance: Casual  Eye Contact:  Good  Speech:  Clear and Coherent  Volume:  Normal  Mood:  NA  Affect:  Appropriate  Thought Process:  Goal Directed  Orientation:  NA  Thought Content:  WDL  Suicidal Thoughts:  No  Homicidal Thoughts:  No  Memory:  Negative  Judgement:  Good  Insight:  Good  Psychomotor Activity:  Normal  Concentration:    Recall:  Abanda of Knowledge:Good  Language: Good  Akathisia:  No  Handed:  Right  AIMS (if indicated):    Assets:  Desire for Improvement  ADL's:  Intact  Cognition: WNL  Sleep:      Treatment Plan  Summary: 7/5/20199:02 AM  At this time we will go ahead and increase his Wellbutrin to a therapeutic dose of 300 mg XL. We talked about the possibility of using something for his anxiety perhaps of benzodiazepines but we are hesitant to do this. 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 Patient agrees to hold off just to increase the Wellbutrin for now and to meet with his doctors to discuss the circumstances of his prostate cancer. He'll continue in one-to-one therapy. The possibility of considering using a small dose of Ativan at his next visit in 7 weeks will be considered. I find he has no specific anxiety disorder. The possibility of also using a non-benzodiazepineagent like Vistaril could be considered.

## 2017-11-28 ENCOUNTER — Telehealth: Payer: Self-pay | Admitting: Hematology and Oncology

## 2017-11-28 NOTE — Telephone Encounter (Signed)
Called regarding date change °

## 2017-12-05 ENCOUNTER — Encounter (HOSPITAL_COMMUNITY): Payer: Self-pay | Admitting: Licensed Clinical Social Worker

## 2017-12-05 ENCOUNTER — Ambulatory Visit (INDEPENDENT_AMBULATORY_CARE_PROVIDER_SITE_OTHER): Payer: Medicare Other | Admitting: Licensed Clinical Social Worker

## 2017-12-05 DIAGNOSIS — F411 Generalized anxiety disorder: Secondary | ICD-10-CM | POA: Diagnosis not present

## 2017-12-05 DIAGNOSIS — F330 Major depressive disorder, recurrent, mild: Secondary | ICD-10-CM | POA: Diagnosis not present

## 2017-12-05 NOTE — Progress Notes (Signed)
   THERAPIST PROGRESS NOTE  Session Time: 9:10am-10:05am  Participation Level: Active  Behavioral Response: Well GroomedAlertEuthymic  Type of Therapy: Individual Therapy  Treatment Goals addressed: Improve psychiatric symptoms, elevate mood (decreased irritability, increased enjoyment of activities), Improve unhelpful thought patterns, emotional regulation skills (reduce temper outburst), Interpersonal relationship skills  Interventions: Motivational Interviewing and Other: Grounding and Mindfulness techniques  Summary: Rodney Friedman is a 72 y.o. male who presents with Major Depressive Disorder, mild and Generalized Anxiety Disorder  Suicidal/Homicidal: No without intent/plan  Therapist Response:  Rodney Friedman met with clinician for individual therapy. Rodney Friedman discussed his psychiatric symptoms and current life events. Rodney Friedman shared that he has noticed a difference with the increase in medication, but seems to feel like he is "dulled". Clinician explored symptoms and noted some slowness in his delivery and some increased thoughtfulness in his phrasing. Clinician explored memory and comprehension and noted no difference. Clinician encouraged Rodney Friedman to continue on current dose and contact if any increased concerns. Clinician noted the positive improvement in reduced reactivity to stress, reduced ruminations and anxiety, and improvement in thought processes. Rodney Friedman identified that he is trying to stay in the present and to not stress about things he is not in control over. Rodney Friedman continued to process his care of his father and noted some concerns about end of life decision making.   Plan: Return again in 2-3 weeks.  Diagnosis:     Axis I: Major depressive disorder, mild and Generalized Anxiety Disorder  [name], LCSW 12/05/2017

## 2017-12-17 ENCOUNTER — Ambulatory Visit: Payer: Medicare Other | Admitting: Family Medicine

## 2017-12-17 ENCOUNTER — Other Ambulatory Visit (HOSPITAL_COMMUNITY): Payer: Self-pay | Admitting: Psychiatry

## 2017-12-18 DIAGNOSIS — C61 Malignant neoplasm of prostate: Secondary | ICD-10-CM | POA: Diagnosis not present

## 2017-12-19 ENCOUNTER — Encounter (HOSPITAL_COMMUNITY): Payer: Self-pay | Admitting: Licensed Clinical Social Worker

## 2017-12-19 ENCOUNTER — Ambulatory Visit (INDEPENDENT_AMBULATORY_CARE_PROVIDER_SITE_OTHER): Payer: Medicare Other | Admitting: Licensed Clinical Social Worker

## 2017-12-19 DIAGNOSIS — F411 Generalized anxiety disorder: Secondary | ICD-10-CM

## 2017-12-19 DIAGNOSIS — F330 Major depressive disorder, recurrent, mild: Secondary | ICD-10-CM | POA: Diagnosis not present

## 2017-12-19 NOTE — Progress Notes (Signed)
   THERAPIST PROGRESS NOTE  Session Time: 9:05-10:00am  Participation Level: Active  Behavioral Response: Well GroomedAlertIrritable   Type of Therapy: Individual Therapy  Treatment Goals addressed: Improve psychiatric symptoms, elevate mood (decreased irritability, increased enjoyment of activities), Improve unhelpful thought patterns, emotional regulation skills (reduce temper outburst), Interpersonal relationship skills  Interventions: Motivational Interviewing and Other: Grounding and Mindfulness techniques  Summary: Rodney Friedman is a 72 y.o. male who presents with Major Depressive Disorder, moderate and Generalized Anxiety Disorder  Suicidal/Homicidal: No without intent/plan  Therapist Response:  [name] met with clinician for individual therapy. [name] discussed his psychiatric symptoms and current life events. [name] shared that he is preparing for a hassle at the [facility] tomorrow to get his and his father's Real ID. Clinician processed ways to prepare for the frustration, as well as to think positively about potential good outcomes. Clinician discussed the choice to "go with the flow" and to focus on positives. Clinician also used CBT "worst case scenario/best case scenario" intervention to process coping skills.  [name] discussed other frustrations with technology, communication with doctors, etc. Clinician utilized solution focused tools in order to problem solve and urged [name] to ask for help, rather than spend a lot of energy getting angry. [name] reports increase of medication has helped in reducing some anxiety and anger.   Plan: Return again in 1-2 weeks.  Diagnosis:     Axis I: Major depressive disorder, moderate and Generalized Anxiety Disorder  [name], LCSW 12/19/2017

## 2017-12-25 DIAGNOSIS — R351 Nocturia: Secondary | ICD-10-CM | POA: Diagnosis not present

## 2017-12-25 DIAGNOSIS — C61 Malignant neoplasm of prostate: Secondary | ICD-10-CM | POA: Diagnosis not present

## 2017-12-25 DIAGNOSIS — C778 Secondary and unspecified malignant neoplasm of lymph nodes of multiple regions: Secondary | ICD-10-CM | POA: Diagnosis not present

## 2017-12-26 ENCOUNTER — Ambulatory Visit (INDEPENDENT_AMBULATORY_CARE_PROVIDER_SITE_OTHER): Payer: Medicare Other | Admitting: Licensed Clinical Social Worker

## 2017-12-26 ENCOUNTER — Encounter (HOSPITAL_COMMUNITY): Payer: Self-pay | Admitting: Licensed Clinical Social Worker

## 2017-12-26 DIAGNOSIS — F330 Major depressive disorder, recurrent, mild: Secondary | ICD-10-CM

## 2017-12-26 DIAGNOSIS — F411 Generalized anxiety disorder: Secondary | ICD-10-CM | POA: Diagnosis not present

## 2017-12-26 NOTE — Progress Notes (Signed)
   THERAPIST PROGRESS NOTE  Session Time: 9:05am-10:15am  Participation Level: Active  Behavioral Response: CasualAlertAnxious and Irritable  Type of Therapy: Individual Therapy  Treatment Goals addressed: Improve psychiatric symptoms, elevate mood (decreased irritability, increased enjoyment of activities), Improve unhelpful thought patterns, emotional regulation skills (reduce temper outburst), Interpersonal relationship skills  Interventions: Motivational Interviewing and Other: Grounding and Mindfulness techniques  Summary: Rodney Friedman is a 72 y.o. male who presents with Major Depressive Disorder, moderate and Generalized Anxiety Disorder  Suicidal/Homicidal: No without intent/plan  Therapist Response:  [name] met with clinician for individual therapy. Rodney Friedman discussed his psychiatric symptoms and current life events. Rodney Friedman shared that he was able to get his Real ID for himself, his father, and his son just after his last session. He reports he has been able to relax a bit and feel good that one task has been checked off his to-do list. However, he reports upcoming concern for next year when he will have to return to the [facility] for his license renewal. Clinician reflected the tendency to start worrying ahead of time, which robs today of its joy. Clinician demonstrated reality testing and encouraged Rodney Friedman to try this for himself when worries become overwhelming. Rodney Friedman reported that past therapists have used this technique of examining worst case scenarios and identifying coping skills.  Rodney Friedman discussed his tendency to hold grudges and processed ongoing anger and feelings of betrayal toward his mother and his daughter. Clinician offered options for attending an Al-Anon meeting in order to get through some of his resentment toward his daughter. Rodney Friedman reported he was not interested at this time, although he said he would consider it.   Plan: Return again in 2-3 weeks.  Diagnosis:     Axis I: Major  depressive disorder, moderate and Generalized Anxiety Disorder  [name], LCSW 12/26/2017

## 2017-12-28 DIAGNOSIS — C61 Malignant neoplasm of prostate: Secondary | ICD-10-CM | POA: Diagnosis not present

## 2017-12-28 DIAGNOSIS — Z5111 Encounter for antineoplastic chemotherapy: Secondary | ICD-10-CM | POA: Diagnosis not present

## 2017-12-31 ENCOUNTER — Other Ambulatory Visit: Payer: Self-pay

## 2017-12-31 ENCOUNTER — Ambulatory Visit: Payer: Self-pay | Admitting: Hematology and Oncology

## 2018-01-01 ENCOUNTER — Ambulatory Visit (INDEPENDENT_AMBULATORY_CARE_PROVIDER_SITE_OTHER): Payer: Medicare Other | Admitting: Pharmacist

## 2018-01-01 DIAGNOSIS — I480 Paroxysmal atrial fibrillation: Secondary | ICD-10-CM | POA: Diagnosis not present

## 2018-01-01 DIAGNOSIS — Z7901 Long term (current) use of anticoagulants: Secondary | ICD-10-CM

## 2018-01-01 DIAGNOSIS — I482 Chronic atrial fibrillation, unspecified: Secondary | ICD-10-CM

## 2018-01-01 LAB — POCT INR: INR: 2.7 (ref 2.0–3.0)

## 2018-01-08 ENCOUNTER — Encounter (HOSPITAL_COMMUNITY): Payer: Self-pay | Admitting: Licensed Clinical Social Worker

## 2018-01-08 ENCOUNTER — Ambulatory Visit (INDEPENDENT_AMBULATORY_CARE_PROVIDER_SITE_OTHER): Payer: Medicare Other | Admitting: Licensed Clinical Social Worker

## 2018-01-08 DIAGNOSIS — F330 Major depressive disorder, recurrent, mild: Secondary | ICD-10-CM

## 2018-01-08 DIAGNOSIS — F411 Generalized anxiety disorder: Secondary | ICD-10-CM | POA: Diagnosis not present

## 2018-01-08 NOTE — Progress Notes (Signed)
   THERAPIST PROGRESS NOTE  Session Time: 8:10am-9:00am  Participation Level: Active  Behavioral Response: Well GroomedAlertDepressed  Type of Therapy: Individual Therapy  Treatment Goals addressed: Improve psychiatric symptoms, elevate mood (decreased irritability, increased enjoyment of activities), Improve unhelpful thought patterns, emotional regulation skills (reduce temper outburst), Interpersonal relationship skills  Interventions: Motivational Interviewing and Other: Grounding and Mindfulness techniques  Summary: Rodney Friedman is a 72 y.o. male who presents with Major Depressive Disorder, mild and Generalized Anxiety Disorder  Suicidal/Homicidal: No without intent/plan  Therapist Response:  [name] met with clinician for individual therapy. Rodney Friedman discussed his psychiatric symptoms and current life events. Rodney Friedman shared ongoing stress about "luxury problems". He reported continuing to get stressed and worried about the future. Clinician utilized MI OARS to reflect and summarize the multitude of issues he has concerns over, including the health of his children, grandchildren, their adoptive mother who has MS, his own possible future of cancer, his father's health, etc. Rodney Friedman also processed some experiences of having open heart surgery while he was in his mid-50s. Clinician identified the importance of being present in today and trying to find some comfort and peace in the moment, rather than planning ahead for the worry.   Plan: Return again in 2 weeks.  Diagnosis:     Axis I: Major depressive disorder, mild and Generalized Anxiety Disorder  [name], LCSW 01/08/2018

## 2018-01-09 ENCOUNTER — Ambulatory Visit (HOSPITAL_COMMUNITY): Payer: Self-pay | Admitting: Licensed Clinical Social Worker

## 2018-01-10 ENCOUNTER — Ambulatory Visit (INDEPENDENT_AMBULATORY_CARE_PROVIDER_SITE_OTHER): Payer: Medicare Other | Admitting: Psychiatry

## 2018-01-10 VITALS — BP 127/69 | HR 58 | Ht 70.5 in | Wt 226.0 lb

## 2018-01-10 DIAGNOSIS — F331 Major depressive disorder, recurrent, moderate: Secondary | ICD-10-CM | POA: Diagnosis not present

## 2018-01-10 MED ORDER — BUPROPION HCL ER (XL) 300 MG PO TB24: ORAL_TABLET | ORAL | 2 refills | Status: DC

## 2018-01-10 NOTE — Progress Notes (Signed)
Psychiatric Initial Adult Assessment   Patient Identification: Rodney Friedman MRN:  [id number] Date of Evaluation:  01/10/2018 Referral Source: Dr. Nyoka Cowden Chief Complaint: Need follow-up care   Visit Diagnosis: Bipolar disorder, substance use disorder No diagnosis found.  History of Present Illness: Today the patient is doing well. It looks like his PSA has dropped to a low almost unmeasurable level. He's taking Lupron. He's pleased with these results. His got good energy. He denies being persistently depressed. He sleeping and eating well. He's got good energy. Back she works out on a daily basis.His family is stable. He seeing his oncologist in about one month. He denies shortness of breath chest pain or any pain at this time. The patient does not use drugs or alcohol. He's been clean for decades. He is very positive and optimistic. He thinks that the increase her Wellbutrin has made a difference. He says is less emotional in a bad way. That is he feels calmer or focused. He clearly feels better and will continue taking all of his medications as prescribed. He denies chest pain shortness of breath or any neurological symptoms.  Previous Psychotropic Medications: Lamictal 300 mg Wellbutrin 150 Substance Abuse History in the last 12 months:  Yes.    Consequences of Substance Abuse: NA  Past Medical History:  Past Medical History:  Diagnosis Date  . Adenomatous colon polyp   . Alcoholism ([place])   . Anxiety   . Atrial fibrillation ([place])   . CAD (coronary artery disease)   . Cataract    removed left eye   . 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 Depression   . Diabetes mellitus without complication ([place])   . Diverticulosis   . Eye abnormality    Macular scarring R eye  . Glaucoma   . Heart murmur   . HLD (hyperlipidemia)   . Hypertension   . Leukemia ([place])    CLL  . Myocardial infarction ([place]) 2006  . Prostate cancer ([place]) 07/08/2013   [place]-  getting Lupron shot every 6 months - traces in prostate and 2 lymphnodes left hip- non focused traces per pt   . Sleep apnea   . Substance abuse ([place])   . 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 Arm Surgery Right    from door accident with glass  . CATARACT EXTRACTION W/ INTRAOCULAR LENS IMPLANT Left   . COLONOSCOPY    . 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 POLYPECTOMY      Family Psychiatric History:   Family History:  Family History  Problem Relation Age of Onset  . Colon cancer Mother   . Ovarian cancer Mother   . Uterine cancer Mother   . Hypertension Father   . Prostate cancer Father   . Colon polyps Neg Hx     Social History:   Social History   Socioeconomic History  . Marital status: Divorced    Spouse name: Not on file  . Number of children: 2  . Years of education: 92  . Highest education level: Not on file  Occupational History  . Occupation: Retired Tour manager  . Financial resource strain: Not on file  . Food insecurity:    Worry: Not on file    Inability: Not on file  . Transportation needs:    Medical: Not on file    Non-medical: Not on file  Tobacco Use  . Smoking status: Current  Every Day Smoker    Packs/day: 0.50    Years: 50.00    Pack years: 25.00    Types: Cigarettes  . Smokeless tobacco: Never Used  Substance and Sexual Activity  . Alcohol use: No    Alcohol/week: 0.0 standard drinks  . Drug use: No  . Sexual activity: Not Currently  Lifestyle  . Physical activity:    Days per week: Not on file    Minutes per session: Not on file  . Stress: Not on file  Relationships  . Social connections:    Talks on phone: Not on file    Gets together: Not on file    Attends religious service: Not on file    Active member of club or organization: Not on file    Attends meetings of clubs or organizations: Not on file    Relationship status: Not on file  Other Topics Concern  . Not  on file  Social History Narrative   Lives at home w/ his father   Divorced   Right-handed   Education: College   No caffeine    Additional Social History:   Allergies:   Allergies  Allergen Reactions  . Other Rash    Allergen: "Plants and bushes while doing yard work"    Metabolic Disorder Labs: Lab Results  Component Value Date   HGBA1C 6.3 (H) 11/02/2017   MPG 117 02/11/2016   MPG 128 (H) 03/22/2015   No results found for: PROLACTIN Lab Results  Component Value Date   CHOL 150 11/02/2017   TRIG 273 (H) 11/02/2017   HDL 34 (L) 11/02/2017   CHOLHDL 4.4 11/02/2017   VLDL 41 (H) 03/10/2016   LDLCALC 61 11/02/2017   LDLCALC 61 02/08/2017     Current Medications: Current Outpatient Medications  Medication Sig Dispense Refill  . amLODipine (NORVASC) 10 MG tablet Take 1 tablet (10 mg total) by mouth daily. 90 tablet 1  . B Complex Vitamins (VITAMIN-B COMPLEX PO) Take by mouth.    Marland Kitchen BIOTIN PO Take by mouth.    Marland Kitchen buPROPion (WELLBUTRIN XL) 300 MG 24 hr tablet 1  qam 90 tablet 2  . Calcium Carbonate-Vit D-Min (CALCIUM 1200 PO) Take 1,200 mg by mouth daily.    Mariane Baumgarten Calcium (STOOL SOFTENER PO) Take 1 tablet as needed by mouth.    Marland Kitchen ketoconazole (NIZORAL) 2 % shampoo Apply 1 application topically 2 (two) times a week. 120 mL 1  . latanoprost (XALATAN) 0.005 % ophthalmic solution Once a day every second day    . Leuprolide Acetate, 6 Month, (LUPRON) 45 MG injection Inject 45 mg into the muscle every 6 (six) months.    Marland Kitchen LYCOPENE PO Take 8 mg by mouth daily.    . metFORMIN (GLUCOPHAGE) 500 MG tablet Take 1 tablet (500 mg total) by mouth every evening. 90 tablet 1  . metoprolol succinate (TOPROL-XL) 100 MG 24 hr tablet Take 1 tablet (100 mg total) by mouth daily. Take with or immediately following a meal. 90 tablet 1  . Omega-3 Fatty Acids (FISH OIL) 1000 MG CAPS Take 1,000 mg by mouth daily.    . rosuvastatin (CRESTOR) 20 MG tablet Take 1 tablet (20 mg total) by mouth  daily. 90 tablet 1  . valsartan (DIOVAN) 320 MG tablet TAKE 1 TABLET BY MOUTH EVERY DAY 90 tablet 2  . vitamin B-12 (CYANOCOBALAMIN) 500 MCG tablet Take 500 mcg by mouth daily.    . vitamin C (ASCORBIC ACID) 500 MG tablet Take  500 mg by mouth daily.    Marland Kitchen warfarin (COUMADIN) 4 MG tablet TAKE 1 TO 1 & 1/2 TABLETS BY MOUTH EVERY DAY AS DIRECTED BY COUMADIN CLINIC 120 tablet 0   Current Facility-Administered Medications  Medication Dose Route Frequency Provider Last Rate Last Dose  . 0.9 %  sodium chloride infusion  500 mL Intravenous Once Doran Stabler, MD        Neurologic: Headache: No Seizure: No Paresthesias:No  Musculoskeletal: Strength & Muscle Tone: within normal limits Gait & Station: normal Patient leans: Right  Psychiatric Specialty Exam: ROS  Blood pressure 127/69, pulse (!) 58, height 5' 10.5" (1.791 m), weight 226 lb (102.5 kg), SpO2 95 %.Body mass index is 31.97 kg/m.  General Appearance: Casual  Eye Contact:  Good  Speech:  Clear and Coherent  Volume:  Normal  Mood:  NA  Affect:  Appropriate  Thought Process:  Goal Directed  Orientation:  NA  Thought Content:  WDL  Suicidal Thoughts:  No  Homicidal Thoughts:  No  Memory:  Negative  Judgement:  Good  Insight:  Good  Psychomotor Activity:  Normal  Concentration:    Recall:  [place] of Knowledge:Good  Language: Good  Akathisia:  No  Handed:  Right  AIMS (if indicated):    Assets:  Desire for Improvement  ADL's:  Intact  Cognition: WNL  Sleep:      Treatment Plan Summary: 8/22/20191:46 PM  At this time the patient's #1 problem is clinical depression. Taking high-dose Wellbutrin is made all the difference. His second problem is that of associated anxiety. The patient fortunately did not need his speech started on anything. Reduction in his depression led to a reduction in his anxiety. This is related to his third problem which is prostate cancer. In this time is actively in care with his  oncologist. At this time he denies any fatigue has good energy. He takes his medicines as prescribed. This patient to return to see me in 3 months. By that time he'll seen a psychologist.

## 2018-01-17 ENCOUNTER — Other Ambulatory Visit: Payer: Self-pay | Admitting: Hematology and Oncology

## 2018-01-17 ENCOUNTER — Telehealth: Payer: Self-pay | Admitting: Hematology and Oncology

## 2018-01-17 ENCOUNTER — Inpatient Hospital Stay: Payer: Medicare Other | Attending: Hematology and Oncology

## 2018-01-17 ENCOUNTER — Inpatient Hospital Stay (HOSPITAL_BASED_OUTPATIENT_CLINIC_OR_DEPARTMENT_OTHER): Payer: Medicare Other | Admitting: Hematology and Oncology

## 2018-01-17 DIAGNOSIS — C61 Malignant neoplasm of prostate: Secondary | ICD-10-CM

## 2018-01-17 DIAGNOSIS — Z79899 Other long term (current) drug therapy: Secondary | ICD-10-CM | POA: Insufficient documentation

## 2018-01-17 DIAGNOSIS — R7303 Prediabetes: Secondary | ICD-10-CM | POA: Diagnosis not present

## 2018-01-17 DIAGNOSIS — E663 Overweight: Secondary | ICD-10-CM | POA: Insufficient documentation

## 2018-01-17 DIAGNOSIS — C9110 Chronic lymphocytic leukemia of B-cell type not having achieved remission: Secondary | ICD-10-CM | POA: Diagnosis not present

## 2018-01-17 LAB — CBC WITH DIFFERENTIAL/PLATELET
Basophils Absolute: 0.1 10*3/uL (ref 0.0–0.1)
Basophils Relative: 0 %
EOS ABS: 0.2 10*3/uL (ref 0.0–0.5)
Eosinophils Relative: 2 %
HCT: 41.2 % (ref 38.4–49.9)
HEMOGLOBIN: 14 g/dL (ref 13.0–17.1)
LYMPHS ABS: 9 10*3/uL — AB (ref 0.9–3.3)
Lymphocytes Relative: 58 %
MCH: 31.1 pg (ref 27.2–33.4)
MCHC: 34 g/dL (ref 32.0–36.0)
MCV: 91.6 fL (ref 79.3–98.0)
Monocytes Absolute: 1.2 10*3/uL — ABNORMAL HIGH (ref 0.1–0.9)
Monocytes Relative: 8 %
NEUTROS PCT: 32 %
Neutro Abs: 5 10*3/uL (ref 1.5–6.5)
Platelets: 189 10*3/uL (ref 140–400)
RBC: 4.5 MIL/uL (ref 4.20–5.82)
RDW: 13.8 % (ref 11.0–14.6)
WBC: 15.5 10*3/uL — ABNORMAL HIGH (ref 4.0–10.3)

## 2018-01-17 NOTE — Telephone Encounter (Signed)
Gave avs and calendar ° °

## 2018-01-18 ENCOUNTER — Encounter: Payer: Self-pay | Admitting: Hematology and Oncology

## 2018-01-18 NOTE — Assessment & Plan Note (Signed)
The patient is receiving Lupron injection under the care of urologist He has seen Dr. Alen Blew for discussion about other form of hormonal manipulation for treatment of prostate cancer He is concerned about the cost of treatment; I tried to reassure him that we should be able to get help through assistance from specialties pharmacy For now, he is not symptomatic and I would defer to urologist for further follow-up

## 2018-01-18 NOTE — Progress Notes (Signed)
[facility] OFFICE PROGRESS NOTE  Patient Care Team: Wendie Agreste, MD as PCP - General (Family Medicine) Croitoru, Dani Gobble, MD as PCP - Cardiology (Cardiology) Kathie Rhodes, MD as Consulting Physician (Urology) Croitoru, Dani Gobble, MD as Consulting Physician (Cardiology)  ASSESSMENT & PLAN:  CLL (chronic lymphocytic leukemia) From the CLL standpoint, he has no evidence of disease progression We discussed the signs and symptoms to watch out for I would recommend influenza vaccination I plan to see him back in 6 months with history, physical examination and blood work  Prostate cancer The patient is receiving Lupron injection under the care of urologist He has seen Dr. Alen Blew for discussion about other form of hormonal manipulation for treatment of prostate cancer He is concerned about the cost of treatment; I tried to reassure him that we should be able to get help through assistance from specialties pharmacy For now, he is not symptomatic and I would defer to urologist for further follow-up  Pre-diabetes The patient is overweight and has not been watching his diet carefully We discussed the importance of healthy living, dietary modification and weight loss He appears to be motivated He will continue close follow-up with primary care doctor for risk factor modification and medication adjustment   No orders of the defined types were placed in this encounter.   INTERVAL HISTORY: Please see below for problem oriented charting. He returns for CLL follow-up The patient is receiving hormonal manipulation for prostate cancer through urologist office He denies urinary symptoms No new lymphadenopathy Denies recent infection, fever or chills His appetite is stable Denies abnormal night sweats  SUMMARY OF ONCOLOGIC HISTORY: Oncology History   Prostate cancer   Primary site: Prostate (Left)   Staging method: AJCC 7th Edition   Clinical: Stage IIB (T1c, N0, M0) signed  by Heath Lark, MD on 07/09/2013 10:04 AM   Summary: Stage IIB (T1c, N0, M0)  CLL stage I. FISH normal     CLL (chronic lymphocytic leukemia) ([place])   07/08/2013 Pathology Results    Peripheral Blood Flow Cytometry - MONOCLONAL B-CELL POPULATION IDENTIFIED. Diagnosis Comment: The phenotypic features are consistent with chronic lymphocytic leukemia. Clinical correlation is recommended.     07/08/2013 Pathology Results    FISH panel is normal     Prostate cancer ([place])   07/12/2012 Tumor Marker    PSA was high at 9.6    09/30/2012 Procedure    He underwent prostate biopsy at confirm diagnosis of prostate cancer.    11/11/2012 - 01/09/2013 Radiation Therapy    The patient completed radiation therapy to his prostate using IM RT technique    06/22/2017 PET scan    PET scan showed left external LN involvement along with activity within prostate     REVIEW OF SYSTEMS:   Constitutional: Denies fevers, chills or abnormal weight loss Eyes: Denies blurriness of vision Ears, nose, mouth, throat, and face: Denies mucositis or sore throat Respiratory: Denies cough, dyspnea or wheezes Cardiovascular: Denies palpitation, chest discomfort or lower extremity swelling Gastrointestinal:  Denies nausea, heartburn or change in bowel habits Skin: Denies abnormal skin rashes Lymphatics: Denies new lymphadenopathy or easy bruising Neurological:Denies numbness, tingling or new weaknesses Behavioral/Psych: Mood is stable, no new changes  All other systems were reviewed with the patient and are negative.  I have reviewed the past medical history, past surgical history, social history and family history with the patient and they are unchanged from previous note.  ALLERGIES:  is allergic to other.  MEDICATIONS:  Current Outpatient Medications  Medication Sig Dispense Refill  . amLODipine (NORVASC) 10 MG tablet Take 1 tablet (10 mg total) by mouth daily. 90 tablet 1  . B Complex Vitamins (VITAMIN-B COMPLEX  PO) Take by mouth.    Marland Kitchen BIOTIN PO Take by mouth.    Marland Kitchen buPROPion (WELLBUTRIN XL) 300 MG 24 hr tablet 1  qam 90 tablet 2  . Calcium Carbonate-Vit D-Min (CALCIUM 1200 PO) Take 1,200 mg by mouth daily.    Mariane Baumgarten Calcium (STOOL SOFTENER PO) Take 1 tablet as needed by mouth.    Marland Kitchen ketoconazole (NIZORAL) 2 % shampoo Apply 1 application topically 2 (two) times a week. 120 mL 1  . latanoprost (XALATAN) 0.005 % ophthalmic solution Once a day every second day    . Leuprolide Acetate, 6 Month, (LUPRON) 45 MG injection Inject 45 mg into the muscle every 6 (six) months.    Marland Kitchen LYCOPENE PO Take 8 mg by mouth daily.    . metFORMIN (GLUCOPHAGE) 500 MG tablet Take 1 tablet (500 mg total) by mouth every evening. 90 tablet 1  . metoprolol succinate (TOPROL-XL) 100 MG 24 hr tablet Take 1 tablet (100 mg total) by mouth daily. Take with or immediately following a meal. 90 tablet 1  . Omega-3 Fatty Acids (FISH OIL) 1000 MG CAPS Take 1,000 mg by mouth daily.    . rosuvastatin (CRESTOR) 20 MG tablet Take 1 tablet (20 mg total) by mouth daily. 90 tablet 1  . valsartan (DIOVAN) 320 MG tablet TAKE 1 TABLET BY MOUTH EVERY DAY 90 tablet 2  . vitamin B-12 (CYANOCOBALAMIN) 500 MCG tablet Take 500 mcg by mouth daily.    . vitamin C (ASCORBIC ACID) 500 MG tablet Take 500 mg by mouth daily.    Marland Kitchen warfarin (COUMADIN) 4 MG tablet TAKE 1 TO 1 & 1/2 TABLETS BY MOUTH EVERY DAY AS DIRECTED BY COUMADIN CLINIC 120 tablet 0   Current Facility-Administered Medications  Medication Dose Route Frequency Provider Last Rate Last Dose  . 0.9 %  sodium chloride infusion  500 mL Intravenous Once Nelida Meuse III, MD        PHYSICAL EXAMINATION: ECOG PERFORMANCE STATUS: 1 - Symptomatic but completely ambulatory  Vitals:   01/17/18 0818  BP: (!) 141/51  Pulse: 60  Resp: 18  Temp: 97.7 F (36.5 C)  SpO2: 100%   Filed Weights   01/17/18 0818  Weight: 226 lb 9.6 oz (102.8 kg)    GENERAL:alert, no distress and comfortable SKIN: skin  color, texture, turgor are normal, no rashes or significant lesions EYES: normal, Conjunctiva are pink and non-injected, sclera clear OROPHARYNX:no exudate, no erythema and lips, buccal mucosa, and tongue normal  NECK: supple, thyroid normal size, non-tender, without nodularity LYMPH:  no palpable lymphadenopathy in the cervical, axillary or inguinal LUNGS: clear to auscultation and percussion with normal breathing effort HEART: regular rate & rhythm and no murmurs and no lower extremity edema ABDOMEN:abdomen soft, non-tender and normal bowel sounds Musculoskeletal:no cyanosis of digits and no clubbing  NEURO: alert & oriented x 3 with fluent speech, no focal motor/sensory deficits  LABORATORY DATA:  I have reviewed the data as listed    Component Value Date/Time   NA 141 11/02/2017 1023   NA 141 01/04/2015 0808   K 4.4 11/02/2017 1023   K 4.3 01/04/2015 0808   CL 106 11/02/2017 1023   CO2 22 11/02/2017 1023   CO2 21 (L) 01/04/2015 0808   GLUCOSE 125 (H) 11/02/2017 1023  GLUCOSE 89 03/10/2016 0830   GLUCOSE 115 01/04/2015 0808   BUN 15 11/02/2017 1023   BUN 18.7 01/04/2015 0808   CREATININE 0.93 11/02/2017 1023   CREATININE 1.16 03/10/2016 0830   CREATININE 0.9 01/04/2015 0808   CALCIUM 9.3 11/02/2017 1023   CALCIUM 9.0 01/04/2015 0808   PROT 6.2 11/02/2017 1023   PROT 6.3 (L) 01/04/2015 0808   ALBUMIN 4.2 11/02/2017 1023   ALBUMIN 3.7 01/04/2015 0808   AST 17 11/02/2017 1023   AST 19 01/04/2015 0808   ALT 24 11/02/2017 1023   ALT 33 01/04/2015 0808   ALKPHOS 40 11/02/2017 1023   ALKPHOS 47 01/04/2015 0808   BILITOT 0.2 11/02/2017 1023   BILITOT 0.23 01/04/2015 0808   GFRNONAA 83 11/02/2017 1023   GFRNONAA 70 02/11/2016 1526   GFRAA 96 11/02/2017 1023   GFRAA 81 02/11/2016 1526    No results found for: SPEP, UPEP  Lab Results  Component Value Date   WBC 15.5 (H) 01/17/2018   NEUTROABS 5.0 01/17/2018   HGB 14.0 01/17/2018   HCT 41.2 01/17/2018   MCV 91.6  01/17/2018   PLT 189 01/17/2018      Chemistry      Component Value Date/Time   NA 141 11/02/2017 1023   NA 141 01/04/2015 0808   K 4.4 11/02/2017 1023   K 4.3 01/04/2015 0808   CL 106 11/02/2017 1023   CO2 22 11/02/2017 1023   CO2 21 (L) 01/04/2015 0808   BUN 15 11/02/2017 1023   BUN 18.7 01/04/2015 0808   CREATININE 0.93 11/02/2017 1023   CREATININE 1.16 03/10/2016 0830   CREATININE 0.9 01/04/2015 0808      Component Value Date/Time   CALCIUM 9.3 11/02/2017 1023   CALCIUM 9.0 01/04/2015 0808   ALKPHOS 40 11/02/2017 1023   ALKPHOS 47 01/04/2015 0808   AST 17 11/02/2017 1023   AST 19 01/04/2015 0808   ALT 24 11/02/2017 1023   ALT 33 01/04/2015 0808   BILITOT 0.2 11/02/2017 1023   BILITOT 0.23 01/04/2015 0808      All questions were answered. The patient knows to call the clinic with any problems, questions or concerns. No barriers to learning was detected.  I spent 15 minutes counseling the patient face to face. The total time spent in the appointment was 20 minutes and more than 50% was on counseling and review of test results  Heath Lark, MD 01/18/2018 8:51 AM

## 2018-01-18 NOTE — Assessment & Plan Note (Signed)
The patient is overweight and has not been watching his diet carefully We discussed the importance of healthy living, dietary modification and weight loss He appears to be motivated He will continue close follow-up with primary care doctor for risk factor modification and medication adjustment

## 2018-01-18 NOTE — Assessment & Plan Note (Signed)
From the CLL standpoint, he has no evidence of disease progression We discussed the signs and symptoms to watch out for I would recommend influenza vaccination I plan to see him back in 6 months with history, physical examination and blood work

## 2018-01-23 ENCOUNTER — Encounter: Payer: Self-pay | Admitting: Hematology and Oncology

## 2018-01-23 ENCOUNTER — Encounter (HOSPITAL_COMMUNITY): Payer: Self-pay | Admitting: Licensed Clinical Social Worker

## 2018-01-23 ENCOUNTER — Ambulatory Visit (INDEPENDENT_AMBULATORY_CARE_PROVIDER_SITE_OTHER): Payer: Medicare Other | Admitting: Licensed Clinical Social Worker

## 2018-01-23 DIAGNOSIS — F330 Major depressive disorder, recurrent, mild: Secondary | ICD-10-CM | POA: Diagnosis not present

## 2018-01-23 NOTE — Progress Notes (Signed)
THERAPIST PROGRESS NOTE  Session Time: 9:05am-10:00am  Participation Level: Active  Behavioral Response: Well GroomedAlertDepressed  Type of Therapy: Individual Therapy  Treatment Goals addressed: Improve psychiatric symptoms, elevate mood (decreased irritability, increased enjoyment of activities), Improve unhelpful thought patterns, emotional regulation skills (reduce temper outburst), Interpersonal relationship skills  Interventions: Motivational Interviewing and Other: Grounding and Mindfulness techniques  Summary: Rodney Friedman is a 72 y.o. male who presents with Major Depressive Disorder, moderate and Generalized Anxiety Disorder  Suicidal/Homicidal: No without intent/plan  Therapist Response:  [name] met with clinician for individual therapy. [name] discussed his psychiatric symptoms and current life events. [name] shared ongoing stress of financially supporting his grandchildren and children. He processed his experiences with recent doctors and updated health status. Clinician processed thoughts and feelings about health issues, including prostate cancer and leukemia. Clinician also identified that despite the fact that his outlook appears to be good, there was still room for improvement in coping and mood sxs. [name] identified interest in putting his coping skills into action and reports that now he has gotten a lot of his story told and it is time to change. Clinician explored willingness to complete homework assignments, but [name] denied interest in written work. However, he may be willing to do some reading.   Plan: Return again in 1-2 weeks.  Diagnosis:     Axis I: Major depressive disorder, moderate and Generalized Anxiety Disorder  Jessica R Schlosberg, LCSW 01/23/2018  

## 2018-02-03 ENCOUNTER — Other Ambulatory Visit: Payer: Self-pay | Admitting: Cardiovascular Disease

## 2018-02-03 DIAGNOSIS — I25709 Atherosclerosis of coronary artery bypass graft(s), unspecified, with unspecified angina pectoris: Secondary | ICD-10-CM

## 2018-02-06 ENCOUNTER — Ambulatory Visit (INDEPENDENT_AMBULATORY_CARE_PROVIDER_SITE_OTHER): Payer: Medicare Other | Admitting: Licensed Clinical Social Worker

## 2018-02-06 ENCOUNTER — Ambulatory Visit (HOSPITAL_COMMUNITY): Payer: Medicare Other | Admitting: Licensed Clinical Social Worker

## 2018-02-06 ENCOUNTER — Encounter (HOSPITAL_COMMUNITY): Payer: Self-pay | Admitting: Licensed Clinical Social Worker

## 2018-02-06 DIAGNOSIS — F330 Major depressive disorder, recurrent, mild: Secondary | ICD-10-CM

## 2018-02-06 NOTE — Progress Notes (Signed)
   THERAPIST PROGRESS NOTE  Session Time: 8:05am-9:00am  Participation Level: Active  Behavioral Response: NeatAlertIrritable  Type of Therapy: Individual Therapy  Treatment Goals addressed: Improve psychiatric symptoms, elevate mood (decreased irritability, increased enjoyment of activities), Improve unhelpful thought patterns, emotional regulation skills (reduce temper outburst), Interpersonal relationship skills  Interventions: Motivational Interviewing and Other: Grounding and Mindfulness techniques  Summary: [name] is a 72 y.o. male who presents with Major Depressive Disorder, moderate and Generalized Anxiety Disorder  Suicidal/Homicidal: No without intent/plan  Therapist Response:  [name] met with clinician for individual therapy. [name] discussed his psychiatric symptoms and current life events. [name] shared ongoing frustration about his financial and family situation. Clinician explored coping skills. Clinician began CBT psychoeducation and discussion of cognitive distortions. Clinician provided list of cognitive distortions and reviewed.  Clinician identified catastrophic thinking and "shoulds" as primary errors in thinking. Clinician also discussed and encouraged using thought stopping and redirection in order to reduce use of cognitive distortions.   Plan: Return again in 1-2 weeks.  Diagnosis:     Axis I: Major depressive disorder, moderate and Generalized Anxiety Disorder  [name], LCSW 02/06/2018

## 2018-02-12 ENCOUNTER — Ambulatory Visit (INDEPENDENT_AMBULATORY_CARE_PROVIDER_SITE_OTHER): Payer: Medicare Other | Admitting: Pharmacist Clinician (PhC)/ Clinical Pharmacy Specialist

## 2018-02-12 DIAGNOSIS — I482 Chronic atrial fibrillation, unspecified: Secondary | ICD-10-CM

## 2018-02-12 DIAGNOSIS — I480 Paroxysmal atrial fibrillation: Secondary | ICD-10-CM | POA: Diagnosis not present

## 2018-02-12 DIAGNOSIS — Z7901 Long term (current) use of anticoagulants: Secondary | ICD-10-CM | POA: Diagnosis not present

## 2018-02-12 LAB — POCT INR: INR: 3.8 — AB (ref 2.0–3.0)

## 2018-02-12 NOTE — Patient Instructions (Signed)
Description   No warfarin Wednesday Sept 25, then continue 1 tablet daily except 1/2 tablet each Monday. Repeat INR in 4 weeks

## 2018-02-13 DIAGNOSIS — D1801 Hemangioma of skin and subcutaneous tissue: Secondary | ICD-10-CM | POA: Diagnosis not present

## 2018-02-13 DIAGNOSIS — D225 Melanocytic nevi of trunk: Secondary | ICD-10-CM | POA: Diagnosis not present

## 2018-02-13 DIAGNOSIS — L219 Seborrheic dermatitis, unspecified: Secondary | ICD-10-CM | POA: Diagnosis not present

## 2018-02-13 DIAGNOSIS — L820 Inflamed seborrheic keratosis: Secondary | ICD-10-CM | POA: Diagnosis not present

## 2018-02-13 DIAGNOSIS — L821 Other seborrheic keratosis: Secondary | ICD-10-CM | POA: Diagnosis not present

## 2018-02-13 DIAGNOSIS — L814 Other melanin hyperpigmentation: Secondary | ICD-10-CM | POA: Diagnosis not present

## 2018-02-15 ENCOUNTER — Encounter: Payer: Self-pay | Admitting: Hematology and Oncology

## 2018-02-15 ENCOUNTER — Encounter: Payer: Self-pay | Admitting: Family Medicine

## 2018-02-15 DIAGNOSIS — Z23 Encounter for immunization: Secondary | ICD-10-CM | POA: Diagnosis not present

## 2018-02-20 ENCOUNTER — Ambulatory Visit (HOSPITAL_COMMUNITY): Payer: Self-pay | Admitting: Licensed Clinical Social Worker

## 2018-03-06 ENCOUNTER — Ambulatory Visit (INDEPENDENT_AMBULATORY_CARE_PROVIDER_SITE_OTHER): Payer: Medicare Other | Admitting: Licensed Clinical Social Worker

## 2018-03-06 ENCOUNTER — Encounter (HOSPITAL_COMMUNITY): Payer: Self-pay | Admitting: Licensed Clinical Social Worker

## 2018-03-06 DIAGNOSIS — F411 Generalized anxiety disorder: Secondary | ICD-10-CM

## 2018-03-06 DIAGNOSIS — F330 Major depressive disorder, recurrent, mild: Secondary | ICD-10-CM | POA: Diagnosis not present

## 2018-03-06 NOTE — Progress Notes (Signed)
   THERAPIST PROGRESS NOTE  Session Time: 9:05am-10:00am  Participation Level: Active  Behavioral Response: Well GroomedAlertDepressed  Type of Therapy: Individual Therapy  Treatment Goals addressed: Improve psychiatric symptoms, elevate mood (decreased irritability, increased enjoyment of activities), Improve unhelpful thought patterns, emotional regulation skills (reduce temper outburst), Interpersonal relationship skills  Interventions: Motivational Interviewing and Other: Grounding and Mindfulness techniques  Summary: Jadarrius Maselli is a 72 y.o. male who presents with Major Depressive Disorder, mild and Generalized Anxiety Disorder  Suicidal/Homicidal: No without intent/plan  Therapist Response:  [name] met with clinician for individual therapy. Cowan discussed his psychiatric symptoms and current life events. Leanard returned with the list of cognitive distortions from last session. He reports that his primary issue is catastrophization. He processed concerns about an IRS audit and noted how his teachings from [place] and NA have encouraged him to take one day at a time. However, he reports that he still worries that something terrible will happen. Clinician processed these thoughts and feelings, and utilized CBT to identify coping skills for worst, best, and most likely scenarios. Clinician also discussed the importance of positive self talk and focusing on what he can control vs what he cannot.  Torrez processed concerns about his grandchildren and children in [place]. He also reports that he is doing better with letting go of plans that granddaughter will move to [employer] for college.   Plan: Return again in 1-2 weeks.  Diagnosis:     Axis I: Major depressive disorder, mild and Generalized Anxiety Disorder   [name], LCSW 03/06/2018

## 2018-03-12 ENCOUNTER — Ambulatory Visit (INDEPENDENT_AMBULATORY_CARE_PROVIDER_SITE_OTHER): Payer: Medicare Other | Admitting: Pharmacist Clinician (PhC)/ Clinical Pharmacy Specialist

## 2018-03-12 DIAGNOSIS — I480 Paroxysmal atrial fibrillation: Secondary | ICD-10-CM | POA: Diagnosis not present

## 2018-03-12 DIAGNOSIS — Z7901 Long term (current) use of anticoagulants: Secondary | ICD-10-CM

## 2018-03-12 DIAGNOSIS — I482 Chronic atrial fibrillation, unspecified: Secondary | ICD-10-CM

## 2018-03-12 LAB — POCT INR: INR: 2.2 (ref 2.0–3.0)

## 2018-03-20 ENCOUNTER — Encounter (HOSPITAL_COMMUNITY): Payer: Self-pay | Admitting: Licensed Clinical Social Worker

## 2018-03-20 ENCOUNTER — Ambulatory Visit (INDEPENDENT_AMBULATORY_CARE_PROVIDER_SITE_OTHER): Payer: Medicare Other | Admitting: Licensed Clinical Social Worker

## 2018-03-20 DIAGNOSIS — F411 Generalized anxiety disorder: Secondary | ICD-10-CM

## 2018-03-20 DIAGNOSIS — F331 Major depressive disorder, recurrent, moderate: Secondary | ICD-10-CM | POA: Diagnosis not present

## 2018-03-20 NOTE — Progress Notes (Signed)
   THERAPIST PROGRESS NOTE  Session Time: 9:10am-10:00am  Participation Level: Active  Behavioral Response: CasualAlertDepressed  Type of Therapy: Individual Therapy  Treatment Goals addressed: Improve psychiatric symptoms, elevate mood (decreased irritability, increased enjoyment of activities), Improve unhelpful thought patterns, emotional regulation skills (reduce temper outburst), Interpersonal relationship skills  Interventions: Motivational Interviewing and Other: Grounding and Mindfulness techniques  Summary: Rodney Friedman is a 72 y.o. male who presents with Major Depressive Disorder, moderate and Generalized Anxiety Disorder  Suicidal/Homicidal: No without intent/plan  Therapist Response:  Rodney Friedman met with clinician for individual therapy. Rodney Friedman discussed his psychiatric symptoms and current life events. Rodney Friedman shared ongoing disappointment in his life, his family, and how things have panned out. He reports he continues to feel a lot of resentment and disappointment in his children. He has not internalized the 12 steps in terms of his children, so although he notes for others in meetings that they have already done the hardest thing by becoming sober, he does not give credit or experience pride for his daughter, who is almost 1 year sober. Clinician processed this using CBT. Clinician also provided supportive counseling for concerns about his taxes, his church, his health, and his plans not coming to fruition regarding his granddaughter's college.  Clinician provided some ideas and resources for college books, etc. Clinician also provided information about resources for him to volunteer and to possibly get an aid for dad through the [place].   Plan: Return again in 2 weeks.  Diagnosis:     Axis I: Major depressive disorder, moderate and Generalized Anxiety Disorder   [name], LCSW 03/20/2018

## 2018-04-03 ENCOUNTER — Ambulatory Visit (INDEPENDENT_AMBULATORY_CARE_PROVIDER_SITE_OTHER): Payer: Medicare Other | Admitting: Licensed Clinical Social Worker

## 2018-04-03 ENCOUNTER — Encounter (HOSPITAL_COMMUNITY): Payer: Self-pay | Admitting: Licensed Clinical Social Worker

## 2018-04-03 DIAGNOSIS — F331 Major depressive disorder, recurrent, moderate: Secondary | ICD-10-CM | POA: Diagnosis not present

## 2018-04-03 DIAGNOSIS — F411 Generalized anxiety disorder: Secondary | ICD-10-CM | POA: Diagnosis not present

## 2018-04-03 NOTE — Progress Notes (Signed)
   THERAPIST PROGRESS NOTE  Session Time: 9:00am-10:00am  Participation Level: Active  Behavioral Response: CasualAlertAnxious and Depressed  Type of Therapy: Individual Therapy  Treatment Goals addressed: Improve psychiatric symptoms, elevate mood (decreased irritability, increased enjoyment of activities), Improve unhelpful thought patterns, emotional regulation skills (reduce temper outburst), Interpersonal relationship skills  Interventions: Motivational Interviewing and Other: Grounding and Mindfulness techniques  Summary: Rodney Friedman is a 72 y.o. male who presents with Major Depressive Disorder, moderate and Generalized Anxiety Disorder  Suicidal/Homicidal: No without intent/plan  Therapist Response:  Rodney Friedman met with clinician for individual therapy. Rodney Friedman discussed his psychiatric symptoms and current life events. Rodney Friedman shared the results of his IRS audit and reported that he owes $42,000. He reports he has been a state of anxiety and panic over the past 10 weeks with this audit. Sxs include: excess sleepiness, pacing, shortness of breath, constant fear, not sleeping at night, chest pains, stress and weight loss. He reports that this has consumed him and he is unable to function normally. Clinician utilized MI OARS to reflect and summarize thoughts and feelings. Rodney Friedman reports he has been trying to get some yard work done, [employer], and getting some exercise. However, this wears him out and he is now needing about a 3 hour nap daily. Clinician reflected all of the work he has done to gather information for his lawyer and that now it is time to wait. Clinician explored alternative coping skills.   Plan: Return again in 1-2 weeks. Discussed panic and anxiety sxs with Dr. Casimiro Needle and explore possible medication for panic attacks.   Diagnosis:     Axis I: Major depressive disorder, moderate and Generalized Anxiety Disorder  [name], LCSW 04/03/2018

## 2018-04-05 ENCOUNTER — Ambulatory Visit (INDEPENDENT_AMBULATORY_CARE_PROVIDER_SITE_OTHER): Payer: Medicare Other | Admitting: Psychiatry

## 2018-04-05 ENCOUNTER — Encounter (HOSPITAL_COMMUNITY): Payer: Self-pay | Admitting: Psychiatry

## 2018-04-05 VITALS — BP 152/71 | HR 62 | Ht 70.5 in | Wt 221.0 lb

## 2018-04-05 DIAGNOSIS — F324 Major depressive disorder, single episode, in partial remission: Secondary | ICD-10-CM | POA: Diagnosis not present

## 2018-04-05 DIAGNOSIS — I25709 Atherosclerosis of coronary artery bypass graft(s), unspecified, with unspecified angina pectoris: Secondary | ICD-10-CM | POA: Diagnosis not present

## 2018-04-05 MED ORDER — GABAPENTIN 300 MG PO CAPS: ORAL_CAPSULE | ORAL | 2 refills | Status: DC

## 2018-04-05 MED ORDER — BUPROPION HCL ER (XL) 300 MG PO TB24: ORAL_TABLET | ORAL | 2 refills | Status: DC

## 2018-04-05 NOTE — Progress Notes (Signed)
Psychiatric Initial Adult Assessment   Patient Identification: Rodney Friedman MRN:  [id number] Date of Evaluation:  04/05/2018 Referral Source: Dr. Nyoka Cowden Chief Complaint: Need follow-up care   Visit Diagnosis: Bipolar disorder, substance use disorder No diagnosis found.  History of Present Illness: Today is a routine visit for this patient but in reality he presently is in crisis.  There is due to some bookkeeping problems he gets up in the IRS $46,000 at this time.  This is an individual who is very important and helpful for his family financially.  He helps his son with his child support, settings almost $1000 every month to help with the woman is taking care of his grandchildren in [place] and lives over a small amount of leftover money.  He takes care of his elderly father who is stable.  The patient is obviously chronically financially stressed but now this new tax bill has put him over the edge in terms of fear and anxiety.  He is very stressed out.  The patient is not sleeping well at all.  Feels extremely anxious.  This patient is in recovery.  He goes to AA on a regular basis.  This degree of anxiety he is been experiencing is nearly paralyzing.  It affects his ability to function to a small degree.  The patient normally is easy-going, and content.  At this time he is frightened and extremely anxious.  He is not suicidal at this time.  He does not drink any alcohol nor does he use any drugs.  He sees his therapist here on a regular basis. Previous Psychotropic Medications: Lamictal 300 mg Wellbutrin 150 Substance Abuse History in the last 12 months:  Yes.    Consequences of Substance Abuse: NA  Past Medical History:  Past Medical History:  Diagnosis Date  . Adenomatous colon polyp   . Alcoholism ([place])   . Anxiety   . Atrial fibrillation ([place])   . CAD (coronary artery disease)   . Cataract    removed left eye   . 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 Depression    . Diabetes mellitus without complication ([place])   . Diverticulosis   . Eye abnormality    Macular scarring R eye  . Glaucoma   . Heart murmur   . HLD (hyperlipidemia)   . Hypertension   . Leukemia ([place])    CLL  . Myocardial infarction ([place]) 2006  . Prostate cancer ([place]) 07/08/2013   [facility] at [facility]- getting Lupron shot every 6 months - traces in prostate and 2 lymphnodes left hip- non focused traces per pt   . Sleep apnea   . Substance abuse ([place])   . 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 Arm Surgery Right    from door accident with glass  . CATARACT EXTRACTION W/ INTRAOCULAR LENS IMPLANT Left   . COLONOSCOPY    . 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 POLYPECTOMY      Family Psychiatric History:   Family History:  Family History  Problem Relation Age of Onset  . Colon cancer Mother   . Ovarian cancer Mother   . Uterine cancer Mother   . Hypertension Father   . Prostate cancer Father   . Colon polyps Neg Hx     Social History:   Social History   Socioeconomic History  . Marital status: Divorced    Spouse name: Not on file  . Number of  children: 2  . Years of education: 57  . Highest education level: Not on file  Occupational History  . Occupation: Retired Tour manager  . Financial resource strain: Not on file  . Food insecurity:    Worry: Not on file    Inability: Not on file  . Transportation needs:    Medical: Not on file    Non-medical: Not on file  Tobacco Use  . Smoking status: Current Every Day Smoker    Packs/day: 0.50    Years: 50.00    Pack years: 25.00    Types: Cigarettes  . Smokeless tobacco: Never Used  Substance and Sexual Activity  . Alcohol use: No    Alcohol/week: 0.0 standard drinks  . Drug use: No  . Sexual activity: Not Currently  Lifestyle  . Physical activity:    Days per week: Not on file    Minutes per session: Not on file  . Stress: Not on file   Relationships  . Social connections:    Talks on phone: Not on file    Gets together: Not on file    Attends religious service: Not on file    Active member of club or organization: Not on file    Attends meetings of clubs or organizations: Not on file    Relationship status: Not on file  Other Topics Concern  . Not on file  Social History Narrative   Lives at home w/ his father   Divorced   Right-handed   Education: College   No caffeine    Additional Social History:   Allergies:   Allergies  Allergen Reactions  . Other Rash    Allergen: "Plants and bushes while doing yard work"    Metabolic Disorder Labs: Lab Results  Component Value Date   HGBA1C 6.3 (H) 11/02/2017   MPG 117 02/11/2016   MPG 128 (H) 03/22/2015   No results found for: PROLACTIN Lab Results  Component Value Date   CHOL 150 11/02/2017   TRIG 273 (H) 11/02/2017   HDL 34 (L) 11/02/2017   CHOLHDL 4.4 11/02/2017   VLDL 41 (H) 03/10/2016   LDLCALC 61 11/02/2017   LDLCALC 61 02/08/2017     Current Medications: Current Outpatient Medications  Medication Sig Dispense Refill  . amLODipine (NORVASC) 10 MG tablet Take 1 tablet (10 mg total) by mouth daily. 90 tablet 1  . B Complex Vitamins (VITAMIN-B COMPLEX PO) Take by mouth.    Marland Kitchen BIOTIN PO Take by mouth.    Marland Kitchen buPROPion (WELLBUTRIN XL) 300 MG 24 hr tablet 1  qam 90 tablet 2  . Calcium Carbonate-Vit D-Min (CALCIUM 1200 PO) Take 1,200 mg by mouth daily.    Mariane Baumgarten Calcium (STOOL SOFTENER PO) Take 1 tablet as needed by mouth.    Marland Kitchen ketoconazole (NIZORAL) 2 % shampoo Apply 1 application topically 2 (two) times a week. 120 mL 1  . latanoprost (XALATAN) 0.005 % ophthalmic solution Once a day every second day    . Leuprolide Acetate, 6 Month, (LUPRON) 45 MG injection Inject 45 mg into the muscle every 6 (six) months.    Marland Kitchen LYCOPENE PO Take 8 mg by mouth daily.    . metFORMIN (GLUCOPHAGE) 500 MG tablet Take 1 tablet (500 mg total) by mouth every evening.  90 tablet 1  . metoprolol succinate (TOPROL-XL) 100 MG 24 hr tablet Take 1 tablet (100 mg total) by mouth daily. Take with or immediately following a meal. 90 tablet 1  .  Omega-3 Fatty Acids (FISH OIL) 1000 MG CAPS Take 1,000 mg by mouth daily.    . rosuvastatin (CRESTOR) 20 MG tablet Take 1 tablet (20 mg total) by mouth daily. 90 tablet 1  . valsartan (DIOVAN) 320 MG tablet TAKE 1 TABLET BY MOUTH EVERY DAY 90 tablet 2  . vitamin B-12 (CYANOCOBALAMIN) 500 MCG tablet Take 500 mcg by mouth daily.    . vitamin C (ASCORBIC ACID) 500 MG tablet Take 500 mg by mouth daily.    Marland Kitchen warfarin (COUMADIN) 4 MG tablet TAKE 1 TO 1 & 1/2 TABLETS BY MOUTH EVERY DAY AS DIRECTED BY COUMADIN CLINIC 120 tablet 0  . warfarin (COUMADIN) 4 MG tablet TAKE 1/2 TO 1 TABLET DAILY AS DIRECTED BY COUMADIN CLINIC 90 tablet 0  . gabapentin (NEURONTIN) 300 MG capsule 1 qam  2  qhs 1  prn 360 capsule 2   Current Facility-Administered Medications  Medication Dose Route Frequency Provider Last Rate Last Dose  . 0.9 %  sodium chloride infusion  500 mL Intravenous Once Doran Stabler, MD        Neurologic: Headache: No Seizure: No Paresthesias:No  Musculoskeletal: Strength & Muscle Tone: within normal limits Gait & Station: normal Patient leans: Right  Psychiatric Specialty Exam: ROS  Blood pressure (!) 152/71, pulse 62, height 5' 10.5" (1.791 m), weight 221 lb (100.2 kg), SpO2 99 %.Body mass index is 31.26 kg/m.  General Appearance: Casual  Eye Contact:  Good  Speech:  Clear and Coherent  Volume:  Normal  Mood:  NA  Affect:  Appropriate  Thought Process:  Goal Directed  Orientation:  NA  Thought Content:  WDL  Suicidal Thoughts:  No  Homicidal Thoughts:  No  Memory:  Negative  Judgement:  Good  Insight:  Good  Psychomotor Activity:  Normal  Concentration:    Recall:  [place] of Knowledge:Good  Language: Good  Akathisia:  No  Handed:  Right  AIMS (if indicated):    Assets:  Desire for Improvement   ADL's:  Intact  Cognition: WNL  Sleep:      Treatment Plan Summary: 11/15/20199:47 AM  At this time this patient will continue taking Wellbutrin 450 mg.  Today's intervention will be to begin her up Neurontin taking 300 mg 1 in the morning and 2 at night.  The patient was instructed that if he feels oversedated he should reduce it by 1 pill at that after a week if he does not feel calmer he should increase it to taking 300 mg 2 in the morning and 2 at night.  I shared with him that is inconsistent to know exactly the proper dose.  The possibility of further raising the dose should certainly be considered.  The patient and I agree that he avoid being on a benzodiazepine.  Patient also has a significant problem with his prostate.  Has to urinate a number times to the right which I think keeps him up.  So I am not clear that his sleep is all that much different than its been.  He is resistant to taking a sleeping medication which I completely understand.  Patient was to be alert he has to take care of his elderly father and deal with financial issues related to this tax bill.  He will continue in therapy and return to see me in about 1 to 2 months.  By the time he returns there should be some resolution about his tax situation.  Once again it  should be stated that this patient is not suicidal.

## 2018-04-14 ENCOUNTER — Encounter: Payer: Self-pay | Admitting: Family Medicine

## 2018-04-15 ENCOUNTER — Encounter: Payer: Self-pay | Admitting: Cardiovascular Disease

## 2018-04-15 ENCOUNTER — Ambulatory Visit (INDEPENDENT_AMBULATORY_CARE_PROVIDER_SITE_OTHER): Payer: Medicare Other | Admitting: Pharmacist Clinician (PhC)/ Clinical Pharmacy Specialist

## 2018-04-15 ENCOUNTER — Ambulatory Visit (INDEPENDENT_AMBULATORY_CARE_PROVIDER_SITE_OTHER): Payer: Medicare Other | Admitting: Cardiovascular Disease

## 2018-04-15 VITALS — BP 140/60 | HR 161 | Ht 70.5 in | Wt 227.8 lb

## 2018-04-15 DIAGNOSIS — I350 Nonrheumatic aortic (valve) stenosis: Secondary | ICD-10-CM

## 2018-04-15 DIAGNOSIS — I480 Paroxysmal atrial fibrillation: Secondary | ICD-10-CM

## 2018-04-15 DIAGNOSIS — I25118 Atherosclerotic heart disease of native coronary artery with other forms of angina pectoris: Secondary | ICD-10-CM | POA: Diagnosis not present

## 2018-04-15 DIAGNOSIS — I4891 Unspecified atrial fibrillation: Secondary | ICD-10-CM | POA: Diagnosis not present

## 2018-04-15 DIAGNOSIS — I482 Chronic atrial fibrillation, unspecified: Secondary | ICD-10-CM

## 2018-04-15 DIAGNOSIS — I25709 Atherosclerosis of coronary artery bypass graft(s), unspecified, with unspecified angina pectoris: Secondary | ICD-10-CM

## 2018-04-15 DIAGNOSIS — Z7901 Long term (current) use of anticoagulants: Secondary | ICD-10-CM | POA: Diagnosis not present

## 2018-04-15 DIAGNOSIS — E785 Hyperlipidemia, unspecified: Secondary | ICD-10-CM | POA: Diagnosis not present

## 2018-04-15 DIAGNOSIS — I10 Essential (primary) hypertension: Secondary | ICD-10-CM | POA: Diagnosis not present

## 2018-04-15 LAB — POCT INR: INR: 5.1 — AB (ref 2.0–3.0)

## 2018-04-15 MED ORDER — METOPROLOL SUCCINATE ER 200 MG PO TB24: 100.0000 mg | ORAL_TABLET | Freq: Two times a day (BID) | ORAL | 3 refills | Status: DC

## 2018-04-15 NOTE — Progress Notes (Signed)
Cardiology Office Note    Date:  04/16/2018   ID:  Rodney Friedman, DOB 1945/12/09, MRN [id number]  PCP:  Wendie Agreste, MD  Cardiologist:   Sanda Klein, MD   chief complaint: palpitations, fatigue   History of Present Illness:  Rodney Friedman is a 72 y.o. male with CAD and remote CABG, paroxysmal atrial fibrillation, mild aortic stenosis, carotid artery stenosis, hypertension, hyperlipidemia and type 2 diabetes mellitus.  He is under a lot of emotional stress due to a conflict with the IRS over tax dues.  He is worried that this will wipe out his life savings.  He continues to be the sole provider for his 64 year old father.  For the last couple of weeks he has noticed increased frequency of palpitations, mild exertional dyspnea and substantial fatigue.  He presents today with atrial fibrillation with rapid ventricular response at a rate of about 160 bpm.  He reports that he has not missed his prescribed metoprolol.  He has not had syncope and denies orthopnea or PND.  He has not had any chest pain.  He remains mild obese with a BMI of 32, although he has lost some weight.  He denies any falls, injuries or bleeding problems.  Today his INR is supratherapeutic.  He has resisted transition to a direct oral anticoagulant due to cost concerns.    He has a history of coronary disease and underwent 3 vessel bypass surgery in [place] in 2006 ("heartburn" and left antecubital pain ) in July 2014 he had an episode of atrial fibrillation. He has significant left ventricular hypertrophy and very mild aortic valve stenosis. He has preserved left ventricular systolic function although there is some degree of anterior hypokinesis by previous echo. His nuclear stress test in June 2013 showed normal pattern of perfusion and an ejection fraction of 52%. He has bilateral carotid bruits that are reportedly due to external carotid artery stenoses. His last carotid ultrasound was performed in September  2017 and showed no significant internal carotid stenosis.  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 He has treated prostate cancer and is followed by Dr. Alvy Bimler.  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 Unfortunately he still smokes roughly 10 cigarettes a day , which he believes he needs due to anxiety.   Past Medical History:  Diagnosis Date  . Adenomatous colon polyp   . Alcoholism ([place])   . Anxiety   . Atrial fibrillation ([place])   . CAD (coronary artery disease)   . Cataract    removed left eye   . 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 Depression   . Diabetes mellitus without complication ([place])   . Diverticulosis   . Eye abnormality    Macular scarring R eye  . Glaucoma   . Heart murmur   . HLD (hyperlipidemia)   . Hypertension   . Leukemia ([place])    CLL  . Myocardial infarction ([place]) 2006  . Prostate cancer ([place]) 07/08/2013   [facility]- getting Lupron shot every 6 months - traces in prostate and 2 lymphnodes left hip- non focused traces per pt   . Sleep apnea   . Substance abuse ([place])   . 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 Arm Surgery Right    from door  accident with glass  . CATARACT EXTRACTION W/ INTRAOCULAR LENS IMPLANT Left   . COLONOSCOPY    . 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 POLYPECTOMY      Current Medications: Outpatient Medications Prior to Visit  Medication Sig Dispense Refill  . amLODipine (NORVASC) 10 MG tablet Take 1 tablet (10 mg total) by mouth daily. 90 tablet 1  . B Complex Vitamins (VITAMIN-B COMPLEX PO) Take by mouth.    Marland Kitchen BIOTIN PO Take by mouth.    Marland Kitchen buPROPion (WELLBUTRIN XL) 300 MG 24 hr tablet 1  qam 90 tablet 2  . Calcium Carbonate-Vit D-Min (CALCIUM 1200 PO) Take 1,200 mg by mouth daily.    Mariane Baumgarten Calcium (STOOL  SOFTENER PO) Take 1 tablet as needed by mouth.    Marland Kitchen ketoconazole (NIZORAL) 2 % shampoo Apply 1 application topically 2 (two) times a week. 120 mL 1  . latanoprost (XALATAN) 0.005 % ophthalmic solution Once a day every second day    . Leuprolide Acetate, 6 Month, (LUPRON) 45 MG injection Inject 45 mg into the muscle every 6 (six) months.    Marland Kitchen LYCOPENE PO Take 8 mg by mouth daily.    . metFORMIN (GLUCOPHAGE) 500 MG tablet Take 1 tablet (500 mg total) by mouth every evening. 90 tablet 1  . Omega-3 Fatty Acids (FISH OIL) 1000 MG CAPS Take 1,000 mg by mouth daily.    . rosuvastatin (CRESTOR) 20 MG tablet Take 1 tablet (20 mg total) by mouth daily. 90 tablet 1  . valsartan (DIOVAN) 320 MG tablet TAKE 1 TABLET BY MOUTH EVERY DAY 90 tablet 2  . vitamin B-12 (CYANOCOBALAMIN) 500 MCG tablet Take 500 mcg by mouth daily.    . vitamin C (ASCORBIC ACID) 500 MG tablet Take 500 mg by mouth daily.    Marland Kitchen warfarin (COUMADIN) 4 MG tablet TAKE 1 TO 1 & 1/2 TABLETS BY MOUTH EVERY DAY AS DIRECTED BY COUMADIN CLINIC 120 tablet 0  . metoprolol succinate (TOPROL-XL) 100 MG 24 hr tablet Take 1 tablet (100 mg total) by mouth daily. Take with or immediately following a meal. 90 tablet 1  . gabapentin (NEURONTIN) 300 MG capsule 1 qam  2  qhs 1  prn 360 capsule 2  . warfarin (COUMADIN) 4 MG tablet TAKE 1/2 TO 1 TABLET DAILY AS DIRECTED BY COUMADIN CLINIC 90 tablet 0   Facility-Administered Medications Prior to Visit  Medication Dose Route Frequency Provider Last Rate Last Dose  . 0.9 %  sodium chloride infusion  500 mL Intravenous Once Doran Stabler, MD         Allergies:   Other   Social History   Socioeconomic History  . Marital status: Divorced    Spouse name: Not on file  . Number of children: 2  . Years of education: 71  . Highest education level: Not on file  Occupational History  . Occupation: Retired Tour manager  . Financial resource strain: Not on file  . Food insecurity:    Worry: Not on  file    Inability: Not on file  . Transportation needs:    Medical: Not on file    Non-medical: Not on file  Tobacco Use  . Smoking status: Current Every Day Smoker    Packs/day: 0.50    Years: 50.00    Pack years: 25.00    Types: Cigarettes  . Smokeless tobacco: Never Used  Substance and Sexual Activity  . Alcohol use: No  Alcohol/week: 0.0 standard drinks  . Drug use: No  . Sexual activity: Not Currently  Lifestyle  . Physical activity:    Days per week: Not on file    Minutes per session: Not on file  . Stress: Not on file  Relationships  . Social connections:    Talks on phone: Not on file    Gets together: Not on file    Attends religious service: Not on file    Active member of club or organization: Not on file    Attends meetings of clubs or organizations: Not on file    Relationship status: Not on file  Other Topics Concern  . Not on file  Social History Narrative   Lives at home w/ his father   Divorced   Right-handed   Education: College   No caffeine     Family History:  The patient's family history includes Colon cancer in his mother; Hypertension in his father; Ovarian cancer in his mother; Prostate cancer in his father; Uterine cancer in his mother.   ROS:   Please see the history of present illness.    All other systems are reviewed and are negative   PHYSICAL EXAM:   VS:  BP 140/60   Pulse (!) 161   Ht 5' 10.5" (1.791 m)   Wt 227 lb 12.8 oz (103.3 kg)   BMI 32.22 kg/m      General: Alert, oriented x3, no distress, mildly obese Head: no evidence of trauma, PERRL, EOMI, no exophtalmos or lid lag, no myxedema, no xanthelasma; normal ears, nose and oropharynx Neck: normal jugular venous pulsations and no hepatojugular reflux; brisk carotid pulses without delay and no carotid bruits Chest: clear to auscultation, no signs of consolidation by percussion or palpation, normal fremitus, symmetrical and full respiratory excursions Cardiovascular:  normal position and quality of the apical impulse, paid irregular rhythm, normal first and second heart sounds, 2/6 early peaking systolic ejection murmur, no diastolic murmurs, rubs or gallops Abdomen: no tenderness or distention, no masses by palpation, no abnormal pulsatility or arterial bruits, normal bowel sounds, no hepatosplenomegaly Extremities: no clubbing, cyanosis or edema; 2+ radial, ulnar and brachial pulses bilaterally; 2+ right femoral, posterior tibial and dorsalis pedis pulses; 2+ left femoral, posterior tibial and dorsalis pedis pulses; no subclavian or femoral bruits Neurological: grossly nonfocal Psych: Normal mood and affect.  He appears anxious and upset, but not depressed.   Wt Readings from Last 3 Encounters:  04/15/18 227 lb 12.8 oz (103.3 kg)  01/17/18 226 lb 9.6 oz (102.8 kg)  11/02/17 225 lb (102.1 kg)      Studies/Labs Reviewed:   EKG:  EKG is ordered today.  The ekg ordered today atrial fibrillation rapid ventricular response, left ventricular hypertrophy with some QRS widening, left axis deviation, prominent ST-T wave changes in 1 and aVL especially, other than the change in rhythm not much else has changed from the previous tracing. Recent Labs: 11/02/2017: ALT 24; BUN 15; Creatinine, Ser 0.93; Potassium 4.4; Sodium 141 01/17/2018: Hemoglobin 14.0; Platelets 189   Lipid Panel    Component Value Date/Time   CHOL 150 11/02/2017 1023   TRIG 273 (H) 11/02/2017 1023   HDL 34 (L) 11/02/2017 1023   CHOLHDL 4.4 11/02/2017 1023   CHOLHDL 5.8 (H) 03/10/2016 0830   VLDL 41 (H) 03/10/2016 0830   LDLCALC 61 11/02/2017 1023    ASSESSMENT:    1. Atrial fibrillation, rapid ([place])   2. Long term current use of anticoagulant  3. Coronary artery disease of native artery of native heart with stable angina pectoris ([place])   4. Aortic valve stenosis, nonrheumatic   5. Dyslipidemia (high LDL; low HDL)   6. Essential hypertension      PLAN:  In order of problems  listed above:  1. AFib: Currently in an episode of atrial fibrillation with rapid ventricular response.  This could very well be adrenergically driven because of his anxiety about the financial issues.  Increase his metoprolol to 100 mg twice a day, to take another dose of 100 mg as soon as he gets home.  I was considering hospitalization, but he does not want to leave his father alone under any circumstances.  He appears to be hemodynamically stable. CHADSVasc 4 (age, HTN, CAD, DM).  Asked him to make sure that he has not missed any doses of metoprolol due to the risks of rebound.  Also advised him that when he goes back to sinus rhythm he could have symptomatic bradycardia, since in the past he had a heart rate around 60 bpm on 100 mg of metoprolol succinate daily. 2. Warfarin: Well-tolerated, no bleeding complications.  Supratherapeutic today, dose is adjusted and he will come in for recheck in a couple of weeks. 3. CAD: Despite the extremely rapid rate he is not experiencing angina; he is on combined therapy with beta-blocker and amlodipine. 4. AS: Mild, not yet hemodynamically relevant 5. HLP: Most recent lipid profile shows excellent LDL cholesterol, but stubbornly low HDL and mildly elevated triglycerides, consistent with a pattern of insulin resistance/small dense LDL atherogenic profile.  I do not think it makes sense to change his lipid-lowering medications.  The issue is reversing his obesity and sedentary lifestyle. 6. DM: Recent excellent hemoglobin A1c on metformin monotherapy.  I am confident that his diabetes could be "cured" with weight loss" 7. Smoking: He continues to smoke 12 cigarettes a day.  He does not think that he can quit.  He is proud to report that he has been sober from alcohol and drugs for 17 years and still goes to [employer]. 8. Carotid bruits: Duplex study has shown that his carotid bruits are related to external carotid artery stenoses.  He only has mild plaque in the  internal carotids 9. OSA: Intolerant to CPAP.  He does not want to try this again. 10. Obesity: I think he needs to make more time to pay attention to his own health and he is to eat a healthier diet.  Is clearly doing good job with diabetes control, but his risk factors are not fully addressed.  She also needs a backup plan for his father.  I suggested he contact [facility]. 11. CLL:  Not requiring treatment. No anemia or thrombocytopenia.  Followed by Dr. Alvy Bimler. 12. HTN: His blood pressure will probably tolerate the increased dose of beta-blocker without a problem.    Medication Adjustments/Labs and Tests Ordered: Current medicines are reviewed at length with the patient today.  Concerns regarding medicines are outlined above.  Medication changes, Labs and Tests ordered today are listed in the Patient Instructions below. Patient Instructions  Medication Instructions:  INCREASE- Metoprolol 100 mg twice a day  If you need a refill on your cardiac medications before your next appointment, please call your pharmacy.  Labwork: None Ordered   If you have labs (blood work) drawn today and your tests are completely normal, you will receive your results only by: Marland Kitchen MyChart Message (if you have MyChart) OR .  A paper copy in the mail If you have any lab test that is abnormal or we need to change your treatment, we will call you to review the results.  Testing/Procedures: None Ordered  Follow-Up: . You will need a follow up appointment in 7-10 days with APP.   Marland Kitchen 3 Months with Dr Sallyanne Kuster   At [facility], you and your health needs are our priority.  As part of our continuing mission to provide you with exceptional heart care, we have created designated Provider Care Teams.  These Care Teams include your primary Cardiologist (physician) and Advanced Practice Providers (APPs -  Physician Assistants and Nurse Practitioners) who all work together to provide you with the care you need,  when you need it.  Thank you for choosing [facility] at [facility]!!          Signed, Sanda Klein, MD  04/16/2018 12:36 PM    [address] [place], [place], [place]  [address] Phone: [phone]; Fax: [phone]

## 2018-04-15 NOTE — Patient Instructions (Signed)
Description   No warfarin Tuesday Nov 25 or Wednesday Nov 26, then decrease dose to 1 tablet daily except 1/2 tablet each Tuesday and Friday. Repeat INR in 2 weeks

## 2018-04-15 NOTE — Patient Instructions (Addendum)
Medication Instructions:  INCREASE- Metoprolol 100 mg twice a day  If you need a refill on your cardiac medications before your next appointment, please call your pharmacy.  Labwork: None Ordered   If you have labs (blood work) drawn today and your tests are completely normal, you will receive your results only by: Rodney Friedman MyChart Message (if you have MyChart) OR . A paper copy in the mail If you have any lab test that is abnormal or we need to change your treatment, we will call you to review the results.  Testing/Procedures: None Ordered  Follow-Up: . You will need a follow up appointment in 7-10 days with APP.   Rodney Friedman 3 Months with Dr Sallyanne Kuster   At [facility], you and your health needs are our priority.  As part of our continuing mission to provide you with exceptional heart care, we have created designated Provider Care Teams.  These Care Teams include your primary Cardiologist (physician) and Advanced Practice Providers (APPs -  Physician Assistants and Nurse Practitioners) who all work together to provide you with the care you need, when you need it.  Thank you for choosing [facility] at [facility]!!

## 2018-04-17 ENCOUNTER — Ambulatory Visit (HOSPITAL_COMMUNITY): Payer: Medicare Other | Admitting: Licensed Clinical Social Worker

## 2018-04-22 ENCOUNTER — Encounter (HOSPITAL_COMMUNITY): Payer: Self-pay | Admitting: Licensed Clinical Social Worker

## 2018-04-22 ENCOUNTER — Ambulatory Visit (INDEPENDENT_AMBULATORY_CARE_PROVIDER_SITE_OTHER): Payer: Medicare Other | Admitting: Licensed Clinical Social Worker

## 2018-04-22 DIAGNOSIS — F331 Major depressive disorder, recurrent, moderate: Secondary | ICD-10-CM

## 2018-04-22 DIAGNOSIS — F411 Generalized anxiety disorder: Secondary | ICD-10-CM

## 2018-04-22 NOTE — Progress Notes (Signed)
   THERAPIST PROGRESS NOTE  Session Time: 9:05am-10:00am  Participation Level: Active  Behavioral Response: NeatAlertAnxious and Depressed  Type of Therapy: Individual Therapy  Treatment Goals addressed: Improve psychiatric symptoms, elevate mood (decreased irritability, increased enjoyment of activities), Improve unhelpful thought patterns, emotional regulation skills (reduce temper outburst), Interpersonal relationship skills  Interventions: Motivational Interviewing and Other: Grounding and Mindfulness techniques  Summary: Agastya Meister is a 72 y.o. male who presents with Major Depressive Disorder, moderate and Generalized Anxiety Disorder  Suicidal/Homicidal: No without intent/plan  Therapist Response:  [name] met with clinician for individual therapy. Osby discussed his psychiatric symptoms and current life events. Eragon shared tightness of chest, short of breath, dizziness,headaches , frequent stops for any physical activity, and Afib. He reported that he has to take his blood pressure daily in order to monitor for assistance. Clinician assisted in reading instructions and ensuring that the cuff was working properly. Mostyn reports ongoing stress in preparing for the hearing on his tax audit. Clinician utilized MI OARS to reflect and summarize thoughts and feelings. Clinician discussed the importance of internalizing the messages he knows from [place], having to do with one day at a time. Clinician also noted that since Denney has several contingency plans, he needs to find peace in the preparation.   Plan: Return again in 1-2 weeks.  Diagnosis:     Axis I: Major depressive disorder, moderate and Generalized Anxiety Disorder  [name], LCSW 04/22/2018

## 2018-04-23 ENCOUNTER — Ambulatory Visit (INDEPENDENT_AMBULATORY_CARE_PROVIDER_SITE_OTHER): Payer: Medicare Other | Admitting: Physician Assistant

## 2018-04-23 ENCOUNTER — Ambulatory Visit (INDEPENDENT_AMBULATORY_CARE_PROVIDER_SITE_OTHER): Payer: Medicare Other | Admitting: Pharmacist Clinician (PhC)/ Clinical Pharmacy Specialist

## 2018-04-23 ENCOUNTER — Encounter: Payer: Self-pay | Admitting: Physician Assistant

## 2018-04-23 VITALS — BP 122/56 | HR 58 | Ht 70.5 in | Wt 225.0 lb

## 2018-04-23 DIAGNOSIS — E119 Type 2 diabetes mellitus without complications: Secondary | ICD-10-CM

## 2018-04-23 DIAGNOSIS — I480 Paroxysmal atrial fibrillation: Secondary | ICD-10-CM

## 2018-04-23 DIAGNOSIS — C9110 Chronic lymphocytic leukemia of B-cell type not having achieved remission: Secondary | ICD-10-CM | POA: Diagnosis not present

## 2018-04-23 DIAGNOSIS — I482 Chronic atrial fibrillation, unspecified: Secondary | ICD-10-CM

## 2018-04-23 DIAGNOSIS — Z7901 Long term (current) use of anticoagulants: Secondary | ICD-10-CM | POA: Diagnosis not present

## 2018-04-23 DIAGNOSIS — I350 Nonrheumatic aortic (valve) stenosis: Secondary | ICD-10-CM | POA: Diagnosis not present

## 2018-04-23 DIAGNOSIS — E785 Hyperlipidemia, unspecified: Secondary | ICD-10-CM | POA: Diagnosis not present

## 2018-04-23 DIAGNOSIS — I10 Essential (primary) hypertension: Secondary | ICD-10-CM | POA: Diagnosis not present

## 2018-04-23 DIAGNOSIS — I25810 Atherosclerosis of coronary artery bypass graft(s) without angina pectoris: Secondary | ICD-10-CM

## 2018-04-23 LAB — POCT INR: INR: 1.8 — AB (ref 2.0–3.0)

## 2018-04-23 NOTE — Progress Notes (Signed)
Cardiology Office Note    Date:  04/23/2018   ID:  Rodney Friedman, DOB 02-20-1946, MRN [id number]  PCP:  Wendie Agreste, MD  Cardiologist: Dr. Sallyanne Kuster  Chief Complaint  Patient presents with  . Follow-up    seen for Dr. Sallyanne Kuster    History of Present Illness:  Rodney Friedman is a 72 y.o. male with past medical history of CAD s/p remote CABG, PAF on coumadin, mild aortic stenosis, CLL, carotid artery stenosis, hypertension, hyperlipidemia and type II DM.  He had three-vessel CABG in [place] in 2006.  He had a episode of atrial fibrillation in July 2014.  Myoview in June 2013 showed normal perfusion and EF of 52%.  He has bilateral carotid bruits that are reportedly due to external carotid artery stenosis.  Last carotid ultrasound in 2017 showed no significant internal carotid stenosis.  Patient was last seen by Dr. Sallyanne Kuster on 04/15/2018 at which time he was in atrial fibrillation with RVR, heart rate was over 160. He was also complaining of significant financial issues related to IRS increased on his tax.  It was initially recommended he is hospitalized, however he did not wish to leave his father alone under any circumstance.  Therefore his metoprolol was increased to 100 mg twice daily in an effort to better control the heart rate.  Patient return to follow-up today.  He is still quite nervous about the IRS audit and has been under a lot of stress.  Based on EKG, he has converted on increased rate control therapy.  He is heart rate and blood pressure are all well controlled on current therapy, I will continue him on 100 mg twice daily of Toprol-XL.  Otherwise he denies any significant chest discomfort, lower extremity edema, orthopnea or PND.  He can continue to follow-up with Dr. Sallyanne Kuster in 3-month.   Past Medical History:  Diagnosis Date  . Adenomatous colon polyp   . Alcoholism ([place])   . Anxiety   . Atrial fibrillation ([place])   . CAD (coronary artery disease)   . Cataract    removed left eye   . 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 Depression   . Diabetes mellitus without complication ([place])   . Diverticulosis   . Eye abnormality    Macular scarring R eye  . Glaucoma   . Heart murmur   . HLD (hyperlipidemia)   . Hypertension   . Leukemia ([place])    CLL  . Myocardial infarction ([place]) 2006  . Prostate cancer ([place]) 07/08/2013   [facility]- getting Lupron shot every 6 months - traces in prostate and 2 lymphnodes left hip- non focused traces per pt   . Sleep apnea   . Substance abuse ([place])   . 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 Arm Surgery Right    from door accident with glass  . CATARACT EXTRACTION W/ INTRAOCULAR LENS IMPLANT Left   . COLONOSCOPY    . 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 POLYPECTOMY      Current Medications: Outpatient Medications Prior to Visit  Medication Sig Dispense Refill  . amLODipine (NORVASC) 10 MG tablet Take 1 tablet (10 mg total) by mouth daily. 90 tablet 1  . B Complex Vitamins (VITAMIN-B COMPLEX PO) Take by mouth.    Marland Kitchen BIOTIN PO Take by mouth.    Marland Kitchen buPROPion (WELLBUTRIN XL) 300 MG 24 hr tablet 1  qam 90 tablet 2  .  Calcium Carbonate-Vit D-Min (CALCIUM 1200 PO) Take 1,200 mg by mouth daily.    Mariane Baumgarten Calcium (STOOL SOFTENER PO) Take 1 tablet as needed by mouth.    Marland Kitchen ketoconazole (NIZORAL) 2 % shampoo Apply 1 application topically 2 (two) times a week. 120 mL 1  . latanoprost (XALATAN) 0.005 % ophthalmic solution Once a day every second day    . Leuprolide Acetate, 6 Month, (LUPRON) 45 MG injection Inject 45 mg into the muscle every 6 (six) months.    Marland Kitchen LYCOPENE PO Take 8 mg by mouth daily.    . metFORMIN (GLUCOPHAGE) 500 MG tablet Take 1 tablet (500 mg total) by mouth every evening. 90 tablet 1  . metoprolol succinate (TOPROL-XL) 200 MG 24 hr tablet Take 0.5 tablets (100 mg total) by mouth 2 (two) times daily. Take with or immediately  following a meal. 90 tablet 3  . Omega-3 Fatty Acids (FISH OIL) 1000 MG CAPS Take 1,000 mg by mouth daily.    . rosuvastatin (CRESTOR) 20 MG tablet Take 1 tablet (20 mg total) by mouth daily. 90 tablet 1  . valsartan (DIOVAN) 320 MG tablet TAKE 1 TABLET BY MOUTH EVERY DAY 90 tablet 2  . vitamin B-12 (CYANOCOBALAMIN) 500 MCG tablet Take 500 mcg by mouth daily.    . vitamin C (ASCORBIC ACID) 500 MG tablet Take 500 mg by mouth daily.    Marland Kitchen warfarin (COUMADIN) 4 MG tablet TAKE 1 TO 1 & 1/2 TABLETS BY MOUTH EVERY DAY AS DIRECTED BY COUMADIN CLINIC 120 tablet 0   Facility-Administered Medications Prior to Visit  Medication Dose Route Frequency Provider Last Rate Last Dose  . 0.9 %  sodium chloride infusion  500 mL Intravenous Once Doran Stabler, MD         Allergies:   Other   Social History   Socioeconomic History  . Marital status: Divorced    Spouse name: Not on file  . Number of children: 2  . Years of education: 66  . Highest education level: Not on file  Occupational History  . Occupation: Retired Tour manager  . Financial resource strain: Not on file  . Food insecurity:    Worry: Not on file    Inability: Not on file  . Transportation needs:    Medical: Not on file    Non-medical: Not on file  Tobacco Use  . Smoking status: Current Every Day Smoker    Packs/day: 0.50    Years: 50.00    Pack years: 25.00    Types: Cigarettes  . Smokeless tobacco: Never Used  Substance and Sexual Activity  . Alcohol use: No    Alcohol/week: 0.0 standard drinks  . Drug use: No  . Sexual activity: Not Currently  Lifestyle  . Physical activity:    Days per week: Not on file    Minutes per session: Not on file  . Stress: Not on file  Relationships  . Social connections:    Talks on phone: Not on file    Gets together: Not on file    Attends religious service: Not on file    Active member of club or organization: Not on file    Attends meetings of clubs or organizations:  Not on file    Relationship status: Not on file  Other Topics Concern  . Not on file  Social History Narrative   Lives at home w/ his father   Divorced   Right-handed   Education: [employer]  No caffeine     Family History:  The patient's family history includes Colon cancer in his mother; Hypertension in his father; Ovarian cancer in his mother; Prostate cancer in his father; Uterine cancer in his mother.   ROS:   Please see the history of present illness.    ROS All other systems reviewed and are negative.   PHYSICAL EXAM:   VS:  BP (!) 122/56   Pulse (!) 58   Ht 5' 10.5" (1.791 m)   Wt 225 lb (102.1 kg)   SpO2 97%   BMI 31.83 kg/m    GEN: Well nourished, well developed, in no acute distress  HEENT: normal  Neck: no JVD, carotid bruits, or masses Cardiac: RRR; no rubs, or gallops,no edema  2/6 systolic murmur Respiratory:  clear to auscultation bilaterally, normal work of breathing GI: soft, nontender, nondistended, + BS MS: no deformity or atrophy  Skin: warm and dry, no rash Neuro:  Alert and Oriented x 3, Strength and sensation are intact Psych: euthymic mood, full affect  Wt Readings from Last 3 Encounters:  04/23/18 225 lb (102.1 kg)  04/15/18 227 lb 12.8 oz (103.3 kg)  01/17/18 226 lb 9.6 oz (102.8 kg)      Studies/Labs Reviewed:   EKG:  EKG is ordered today.  The ekg ordered today demonstrates sinus bradycardia, occasional PVC.  Recent Labs: 11/02/2017: ALT 24; BUN 15; Creatinine, Ser 0.93; Potassium 4.4; Sodium 141 01/17/2018: Hemoglobin 14.0; Platelets 189   Lipid Panel    Component Value Date/Time   CHOL 150 11/02/2017 1023   TRIG 273 (H) 11/02/2017 1023   HDL 34 (L) 11/02/2017 1023   CHOLHDL 4.4 11/02/2017 1023   CHOLHDL 5.8 (H) 03/10/2016 0830   VLDL 41 (H) 03/10/2016 0830   LDLCALC 61 11/02/2017 1023    Additional studies/ records that were reviewed today include:   Echo 12/15/2012    ASSESSMENT:    1. Paroxysmal atrial fibrillation  (HCC)   2. Coronary artery disease involving coronary bypass graft of native heart without angina pectoris   3. Nonrheumatic aortic valve stenosis   4. Essential hypertension   5. Hyperlipidemia, unspecified hyperlipidemia type   6. Controlled type 2 diabetes mellitus without complication, without long-term current use of insulin ([place])   7. CLL (chronic lymphocytic leukemia) (HCC)      PLAN:  In order of problems listed above:  1. Paroxysmal atrial fibrillation: On Toprol-XL 100 mg twice daily.  He has converted on rate control therapy.  His blood pressure and heart rate is quite stable on current therapy.  2. CAD s/p CABG: Denies any obvious chest discomfort  3. Hypertension: Blood pressure well controlled  4. Hyperlipidemia: On Crestor 20 mg daily.  5. DM2: Managed by primary care provider  6. Aortic valve stenosis: Despite the fact patient says he has known about a heart murmur for 15 years, previous echocardiogram from 2014 did not reveal any significant aortic stenosis with normal EF.  He continued to have 2 out of 6 heart murmur on physical exam, I recommend him to have a repeat echocardiogram next year  7. CLL: Followed by oncology service    Medication Adjustments/Labs and Tests Ordered: Current medicines are reviewed at length with the patient today.  Concerns regarding medicines are outlined above.  Medication changes, Labs and Tests ordered today are listed in the Patient Instructions below. Patient Instructions  Medication Instructions:  Your physician recommends that you continue on your current medications as directed. Please refer  to the Current Medication list given to you today.  If you need a refill on your cardiac medications before your next appointment, please call your pharmacy.   Lab work: NONE If you have labs (blood work) drawn today and your tests are completely normal, you will receive your results only by: Marland Kitchen MyChart Message (if you have MyChart)  OR . A paper copy in the mail If you have any lab test that is abnormal or we need to change your treatment, we will call you to review the results.  Testing/Procedures: NONE  Follow-Up: At [facility], [facility], [facility], you and your health needs are our priority.  As part of our continuing mission to provide you with exceptional heart care, we have created designated Provider Care Teams.  These Care Teams include your primary Cardiologist (physician) and Advanced Practice Providers (APPs -  Physician Assistants and Nurse Practitioners) who all work together to provide you with the care you need, when you need it. You will need a follow up appointment in 3 months.  You may see Sanda Klein, MD or one of the following Advanced Practice Providers on your designated Care Team: [place], [place] . Fabian Sharp, PA-C      Signed, [place], [place]  04/23/2018 3:09 PM    [facility] [address], [place], [place]  [address] Phone: [phone]; Fax: [phone]

## 2018-04-23 NOTE — Patient Instructions (Signed)
Medication Instructions:  Your physician recommends that you continue on your current medications as directed. Please refer to the Current Medication list given to you today.  If you need a refill on your cardiac medications before your next appointment, please call your pharmacy.   Lab work: NONE If you have labs (blood work) drawn today and your tests are completely normal, you will receive your results only by: Marland Kitchen MyChart Message (if you have MyChart) OR . A paper copy in the mail If you have any lab test that is abnormal or we need to change your treatment, we will call you to review the results.  Testing/Procedures: NONE  Follow-Up: At [facility], you and your health needs are our priority.  As part of our continuing mission to provide you with exceptional heart care, we have created designated Provider Care Teams.  These Care Teams include your primary Cardiologist (physician) and Advanced Practice Providers (APPs -  Physician Assistants and Nurse Practitioners) who all work together to provide you with the care you need, when you need it. You will need a follow up appointment in 3 months.  You may see Sanda Klein, MD or one of the following Advanced Practice Providers on your designated Care Team: [place], [place] . Fabian Sharp, PA-C

## 2018-05-02 ENCOUNTER — Encounter: Payer: Medicare Other | Admitting: Family Medicine

## 2018-05-03 ENCOUNTER — Ambulatory Visit (INDEPENDENT_AMBULATORY_CARE_PROVIDER_SITE_OTHER): Payer: Medicare Other | Admitting: Family Medicine

## 2018-05-03 ENCOUNTER — Other Ambulatory Visit: Payer: Self-pay

## 2018-05-03 ENCOUNTER — Encounter: Payer: Self-pay | Admitting: Family Medicine

## 2018-05-03 VITALS — BP 119/56 | HR 58 | Temp 97.8°F | Ht 69.0 in | Wt 233.2 lb

## 2018-05-03 DIAGNOSIS — I25810 Atherosclerosis of coronary artery bypass graft(s) without angina pectoris: Secondary | ICD-10-CM | POA: Diagnosis not present

## 2018-05-03 DIAGNOSIS — Z0000 Encounter for general adult medical examination without abnormal findings: Secondary | ICD-10-CM

## 2018-05-03 DIAGNOSIS — E785 Hyperlipidemia, unspecified: Secondary | ICD-10-CM | POA: Diagnosis not present

## 2018-05-03 DIAGNOSIS — C61 Malignant neoplasm of prostate: Secondary | ICD-10-CM | POA: Diagnosis not present

## 2018-05-03 DIAGNOSIS — I10 Essential (primary) hypertension: Secondary | ICD-10-CM | POA: Diagnosis not present

## 2018-05-03 DIAGNOSIS — C9110 Chronic lymphocytic leukemia of B-cell type not having achieved remission: Secondary | ICD-10-CM | POA: Diagnosis not present

## 2018-05-03 DIAGNOSIS — E119 Type 2 diabetes mellitus without complications: Secondary | ICD-10-CM

## 2018-05-03 MED ORDER — ROSUVASTATIN CALCIUM 20 MG PO TABS: 20.0000 mg | ORAL_TABLET | Freq: Every day | ORAL | 1 refills | Status: DC

## 2018-05-03 MED ORDER — VALSARTAN 320 MG PO TABS: ORAL_TABLET | ORAL | 2 refills | Status: DC

## 2018-05-03 MED ORDER — METFORMIN HCL 500 MG PO TABS: 500.0000 mg | ORAL_TABLET | Freq: Every evening | ORAL | 1 refills | Status: DC

## 2018-05-03 MED ORDER — AMLODIPINE BESYLATE 10 MG PO TABS: 10.0000 mg | ORAL_TABLET | Freq: Every day | ORAL | 1 refills | Status: DC

## 2018-05-03 NOTE — Patient Instructions (Addendum)
Thanks for coming in today.  No med changes at this time.  Let me know if you need some resources from social work for some of the concerns we discussed.   Preventive Care 70 Years and Older, Male Preventive care refers to lifestyle choices and visits with your health care provider that can promote health and wellness. What does preventive care include?  A yearly physical exam. This is also called an annual well check.  Dental exams once or twice a year.  Routine eye exams. Ask your health care provider how often you should have your eyes checked.  Personal lifestyle choices, including: ? Daily care of your teeth and gums. ? Regular physical activity. ? Eating a healthy diet. ? Avoiding tobacco and drug use. ? Limiting alcohol use. ? Practicing safe sex. ? Taking low doses of aspirin every day. ? Taking vitamin and mineral supplements as recommended by your health care provider. What happens during an annual well check? The services and screenings done by your health care provider during your annual well check will depend on your age, overall health, lifestyle risk factors, and family history of disease. Counseling Your health care provider may ask you questions about your:  Alcohol use.  Tobacco use.  Drug use.  Emotional well-being.  Home and relationship well-being.  Sexual activity.  Eating habits.  History of falls.  Memory and ability to understand (cognition).  Work and work Statistician.  Screening You may have the following tests or measurements:  Height, weight, and BMI.  Blood pressure.  Lipid and cholesterol levels. These may be checked every 5 years, or more frequently if you are over 40 years old.  Skin check.  Lung cancer screening. You may have this screening every year starting at age 70 if you have a 30-pack-year history of smoking and currently smoke or have quit within the past 15 years.  Fecal occult blood test (FOBT) of the stool. You  may have this test every year starting at age 70.  Flexible sigmoidoscopy or colonoscopy. You may have a sigmoidoscopy every 5 years or a colonoscopy every 10 years starting at age 70.  Prostate cancer screening. Recommendations will vary depending on your family history and other risks.  Hepatitis C blood test.  Hepatitis B blood test.  Sexually transmitted disease (STD) testing.  Diabetes screening. This is done by checking your blood sugar (glucose) after you have not eaten for a while (fasting). You may have this done every 1-3 years.  Abdominal aortic aneurysm (AAA) screening. You may need this if you are a current or former smoker.  Osteoporosis. You may be screened starting at age 70 if you are at high risk.  Talk with your health care provider about your test results, treatment options, and if necessary, the need for more tests. Vaccines Your health care provider may recommend certain vaccines, such as:  Influenza vaccine. This is recommended every year.  Tetanus, diphtheria, and acellular pertussis (Tdap, Td) vaccine. You may need a Td booster every 10 years.  Varicella vaccine. You may need this if you have not been vaccinated.  Zoster vaccine. You may need this after age 70.  Measles, mumps, and rubella (MMR) vaccine. You may need at least one dose of MMR if you were born in 1957 or later. You may also need a second dose.  Pneumococcal 13-valent conjugate (PCV13) vaccine. One dose is recommended after age 70.  Pneumococcal polysaccharide (PPSV23) vaccine. One dose is recommended after age 70.  Meningococcal  vaccine. You may need this if you have certain conditions.  Hepatitis A vaccine. You may need this if you have certain conditions or if you travel or work in places where you may be exposed to hepatitis A.  Hepatitis B vaccine. You may need this if you have certain conditions or if you travel or work in places where you may be exposed to hepatitis  B.  Haemophilus influenzae type b (Hib) vaccine. You may need this if you have certain risk factors.  Talk to your health care provider about which screenings and vaccines you need and how often you need them. This information is not intended to replace advice given to you by your health care provider. Make sure you discuss any questions you have with your health care provider. Document Released: 06/04/2015 Document Revised: 01/26/2016 Document Reviewed: 03/09/2015 Elsevier Interactive Patient Education  [employer].   If you have lab work done today you will be contacted with your lab results within the next 2 weeks.  If you have not heard from [place] then please contact us. The fastest way to get your results is to register for My Chart.   IF you received an x-ray today, you will receive an invoice from [facility]. Please contact [facility] at [phone] with questions or concerns regarding your invoice.   IF you received labwork today, you will receive an invoice from [facility]. Please contact [facility] at [phone] with questions or concerns regarding your invoice.   Our billing staff will not be able to assist you with questions regarding bills from these companies.  You will be contacted with the lab results as soon as they are available. The fastest way to get your results is to activate your My Chart account. Instructions are located on the last page of this paperwork. If you have not heard from [place] regarding the results in 2 weeks, please contact this office.

## 2018-05-03 NOTE — Progress Notes (Signed)
Subjective:    Patient ID: Rodney Friedman, male    DOB: 1946-04-17, 72 y.o.   MRN: [id number]  HPI Rodney Friedman is a 72 y.o. male Presents today for: Chief Complaint  Patient presents with  . Annual Exam    CPE  . Depression    PHQ9=9   History of multiple medical problems including CLL, prostate cancer, CAD, atrial fibrillation, depression, diabetes.  Here for annual exam  Depression: Followed by psychiatrist Dr. Casimiro Needle.  Appears he was seen with counseling on December 2, meeting with her regularly.   Currently takes Wellbutrin XL 300 mg daily, and has been taking gabapentin at night to sleep at night.  September received letter form IRS, audited and told he owes 43k dollars. [employer] is representing him.  Very upset.  Will know more mid January. Money is pulled from social security for now.   Depression screen [facility] 2/9 05/03/2018 11/02/2017 06/18/2017 02/08/2017 02/08/2017  Decreased Interest 0 0 0 0 1  Down, Depressed, Hopeless 3 0 0 0 1  PHQ - 2 Score 3 0 0 0 2  Altered sleeping 0 - - - 3  Tired, decreased energy 3 - - - 3  Change in appetite 0 - - - 0  Feeling bad or failure about yourself  3 - - - 0  Trouble concentrating 0 - - - 0  Moving slowly or fidgety/restless 0 - - - 0  Suicidal thoughts 0 - - - 0  PHQ-9 Score 9 - - - 8  Difficult doing work/chores Somewhat difficult - - - Somewhat difficult   Cardiac: History of CAD, A. fib.  Cardiology visit December 3 with anticoagulation visit same day.  He is on Coumadin for anticoagulation, metoprolol for rate control.  Cardiologist Dr.Croitoru.  He was in A. fib with RVR at December 3 visit.  Metoprolol was increased to twice per day.  He was cautioned about symptomatic bradycardia at the higher dose of beta-blocker. Denies new lightheadedness or dizziness. If he did have to go to hospital has overnight nurse available but costly.  Looking into [place] [facility] if needed for dad's care.   Tobacco abuse, continues to smoke 12  cigarettes/day, not instructed quitting at recent cardiology visit. Not interested in quitting.   Hyperlipidemia: Same dose of Crestor at recent cardiology visit. Lab Results  Component Value Date   CHOL 150 11/02/2017   HDL 34 (L) 11/02/2017   LDLCALC 61 11/02/2017   TRIG 273 (H) 11/02/2017   CHOLHDL 4.4 11/02/2017   Lab Results  Component Value Date   ALT 24 11/02/2017   AST 17 11/02/2017   ALKPHOS 40 11/02/2017   BILITOT 0.2 11/02/2017   Diabetes: He is on metformin 500 mg QAM.. he is on statin and ARB.  Due for microalbumin.  Health maintenance diabetes is up-to-date Lab Results  Component Value Date   HGBA1C 6.3 (H) 11/02/2017   HGBA1C 6.6 (H) 06/18/2017   HGBA1C 6.5 (H) 02/15/2017   Lab Results  Component Value Date   MICROALBUR 1.9 10/08/2014   LDLCALC 61 11/02/2017   CREATININE 0.93 11/02/2017   Cancer screening: Colonoscopy 09/25/2017 Prostate cancer: History of prostate cancer, recurrent prostate cancer followed by Dr. Karsten Ro.  PSA has been undetectable, had discussed possibly using Zytiga but had been deferred.  Has continued Lupron injections. Requests PSA, and then if elevated will follow up with urology.   CLL: Lab Results  Component Value Date   WBC 15.5 (H) 01/17/2018  HGB 14.0 01/17/2018   HCT 41.2 01/17/2018   MCV 91.6 01/17/2018   PLT 189 01/17/2018  plan for appt with hematology in Jan or FEb.     Immunizations: Immunization History  Administered Date(s) Administered  . Influenza Split 02/17/2015  . Influenza, High Dose Seasonal PF 02/15/2018  . Influenza,inj,Quad PF,6+ Mos 02/11/2016, 02/08/2017  . Influenza-Unspecified 02/17/2015  . Pneumococcal Conjugate-13 02/24/2014  . Pneumococcal Polysaccharide-23 09/08/2015  . Tdap 09/08/2015  . Zoster 05/22/2013   Depression screening as above  Denies falls in the past year  Functional Status Survey: Is the patient deaf or have difficulty hearing?: No Does the patient have difficulty  seeing, even when wearing glasses/contacts?: Yes Does the patient have difficulty concentrating, remembering, or making decisions?: No Does the patient have difficulty walking or climbing stairs?: No Does the patient have difficulty dressing or bathing?: No Does the patient have difficulty doing errands alone such as visiting a doctor's office or shopping?: No  Memory screening: 6CIT Screen 05/03/2018 02/08/2017  What Year? 0 points 0 points  What month? 0 points 0 points  What time? 0 points 0 points  Count back from 20 0 points 0 points  Months in reverse 0 points 0 points  Repeat phrase 0 points 0 points  Total Score 0 0    Vision screening:  Visual Acuity Screening   Right eye Left eye Both eyes  Without correction: 20/200 20/30 20/30  With correction:     History of central serous chorioretinopathy.  Uses Xalatan eyedrops.  "scarred macula". 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 optho at [facility]. Dr. Manuella Ghazi.   Dental: last dental eval 5 weeks ago  Activity/exercise: working outside, cleaning leaves.   Advanced directives; has healthcare power of attorney and living will.   Patient Active Problem List   Diagnosis Date Noted  . Paroxysmal atrial fibrillation ([place]) 04/21/2016  . Diabetes mellitus ([place]) 04/21/2016  . OSA (obstructive sleep apnea) 04/21/2016  . Quality of life palliative care encounter 01/03/2016  . Bilateral carotid bruits without stenosis 01/02/2016  . Tremor, essential 09/22/2015  . Glaucoma, open angle 06/16/2015  . 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  . Pseudoaphakia 06/15/2015  . CD (contact dermatitis) 04/29/2014  . 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 Hematuria 01/05/2014  . Skin lesion 01/05/2014  . Mild aortic valve stenosis 01/03/2014  . Hypertensive heart disease 01/03/2014  . Mixed hyperlipidemia 01/03/2014  . Mild obesity 01/03/2014  . Tobacco abuse 01/03/2014  . Chronic atrial fibrillation 12/31/2013    . Long term current use of anticoagulant therapy 12/31/2013  . Cataract, nuclear 10/06/2013  . Dermatochalasis of eyelid 10/06/2013  . 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 CAD (coronary artery disease) 09/03/2013  . Pre-diabetes 09/03/2013  . CLL (chronic lymphocytic leukemia) ([place]) 07/08/2013  . Prostate cancer ([place]) 07/08/2013   Past Medical History:  Diagnosis Date  . Adenomatous colon polyp   . Alcoholism ([place])   . Anxiety   . Atrial fibrillation ([place])   . CAD (coronary artery disease)   . Cataract    removed left eye   . CLL (chronic lymphocytic leukemia) ([place]) 07/08/2013  . Depression   . Diabetes mellitus without complication ([place])   . Diverticulosis   . Eye abnormality    Macular scarring R eye  . Glaucoma   . Heart murmur   . HLD (hyperlipidemia)   . Hypertension   . Leukemia ([place])    CLL  . Myocardial infarction ([place]) 2006  .  Prostate cancer ([place]) 07/08/2013   [facility] at [facility]- getting Lupron shot every 6 months - traces in prostate and 2 lymphnodes left hip- non focused traces per pt   . Sleep apnea   . Substance abuse ([place])   . 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 Arm Surgery Right    from door accident with glass  . CATARACT EXTRACTION W/ INTRAOCULAR LENS IMPLANT Left   . COLONOSCOPY    . 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 POLYPECTOMY     Allergies  Allergen Reactions  . Other Rash    Allergen: "Plants and bushes while doing yard work"   Prior to Admission medications   Medication Sig Start Date End Date Taking? Authorizing Provider  amLODipine (NORVASC) 10 MG tablet Take 1 tablet (10 mg total) by mouth daily. 11/02/17  Yes Wendie Agreste, MD  B Complex Vitamins (VITAMIN-B COMPLEX PO) Take by mouth.   Yes [provider]  BIOTIN PO Take by mouth.   Yes [provider]  buPROPion (WELLBUTRIN XL) 300 MG 24 hr tablet 1  qam 04/05/18  Yes Plovsky, Berneta Sages, MD  Calcium  Carbonate-Vit D-Min (CALCIUM 1200 PO) Take 1,200 mg by mouth daily.   Yes [provider]  Docusate Calcium (STOOL SOFTENER PO) Take 1 tablet as needed by mouth.   Yes [provider]  gabapentin (NEURONTIN) 300 MG capsule Take 300 mg by mouth 3 (three) times daily.   Yes [provider]  ketoconazole (NIZORAL) 2 % shampoo Apply 1 application topically 2 (two) times a week. 06/18/17  Yes Wendie Agreste, MD  latanoprost (XALATAN) 0.005 % ophthalmic solution Once a day every second day 06/30/13  Yes [provider]  Leuprolide Acetate, 6 Month, (LUPRON) 45 MG injection Inject 45 mg into the muscle every 6 (six) months.   Yes [provider]  LYCOPENE PO Take 8 mg by mouth daily.   Yes [provider]  metFORMIN (GLUCOPHAGE) 500 MG tablet Take 1 tablet (500 mg total) by mouth every evening. 11/02/17  Yes Wendie Agreste, MD  metoprolol succinate (TOPROL-XL) 200 MG 24 hr tablet Take 0.5 tablets (100 mg total) by mouth 2 (two) times daily. Take with or immediately following a meal. 04/15/18  Yes Croitoru, Mihai, MD  Omega-3 Fatty Acids (FISH OIL) 1000 MG CAPS Take 1,000 mg by mouth daily.   Yes [provider]  rosuvastatin (CRESTOR) 20 MG tablet Take 1 tablet (20 mg total) by mouth daily. 11/02/17  Yes Wendie Agreste, MD  valsartan (DIOVAN) 320 MG tablet TAKE 1 TABLET BY MOUTH EVERY DAY 11/02/17  Yes Wendie Agreste, MD  vitamin B-12 (CYANOCOBALAMIN) 500 MCG tablet Take 500 mcg by mouth daily.   Yes [provider]  vitamin C (ASCORBIC ACID) 500 MG tablet Take 500 mg by mouth daily.   Yes [provider]  warfarin (COUMADIN) 4 MG tablet TAKE 1 TO 1 & 1/2 TABLETS BY MOUTH EVERY DAY AS DIRECTED BY COUMADIN CLINIC 04/09/17  Yes Croitoru, Mihai, MD   Social History   Socioeconomic History  . Marital status: Divorced    Spouse name: Not on file  . Number of children: 2  . Years of education: 31  . Highest education  level: Not on file  Occupational History  . Occupation: Retired Tour manager  . Financial resource strain: Not on file  . Food insecurity:    Worry:  Not on file    Inability: Not on file  . Transportation needs:    Medical: Not on file    Non-medical: Not on file  Tobacco Use  . Smoking status: Current Every Day Smoker    Packs/day: 0.50    Years: 50.00    Pack years: 25.00    Types: Cigarettes  . Smokeless tobacco: Never Used  Substance and Sexual Activity  . Alcohol use: No    Alcohol/week: 0.0 standard drinks  . Drug use: No  . Sexual activity: Not Currently  Lifestyle  . Physical activity:    Days per week: Not on file    Minutes per session: Not on file  . Stress: Not on file  Relationships  . Social connections:    Talks on phone: Not on file    Gets together: Not on file    Attends religious service: Not on file    Active member of club or organization: Not on file    Attends meetings of clubs or organizations: Not on file    Relationship status: Not on file  . Intimate partner violence:    Fear of current or ex partner: Not on file    Emotionally abused: Not on file    Physically abused: Not on file    Forced sexual activity: Not on file  Other Topics Concern  . Not on file  Social History Narrative   Lives at home w/ his father   Divorced   Right-handed   Education: College   No caffeine    Review of Systems 13 point review of systems per patient health survey noted.  Negative other than as indicated above or in HPI.      Objective:   Physical Exam Vitals signs reviewed.  Constitutional:      Appearance: He is well-developed.  HENT:     Head: Normocephalic and atraumatic.     Right Ear: External ear normal.     Left Ear: External ear normal.  Eyes:     Conjunctiva/sclera: Conjunctivae normal.     Pupils: Pupils are equal, round, and reactive to light.  Neck:     Musculoskeletal: Normal range of motion and neck supple.     Thyroid:  No thyromegaly.  Cardiovascular:     Rate and Rhythm: Normal rate and regular rhythm.     Heart sounds: Normal heart sounds.  Pulmonary:     Effort: Pulmonary effort is normal. No respiratory distress.     Breath sounds: Normal breath sounds. No wheezing.  Abdominal:     General: There is no distension.     Palpations: Abdomen is soft.     Tenderness: There is no abdominal tenderness.  Musculoskeletal: Normal range of motion.        General: No tenderness.  Skin:    General: Skin is warm and dry.  Neurological:     Mental Status: He is alert and oriented to person, place, and time.     Deep Tendon Reflexes: Reflexes are normal and symmetric.  Psychiatric:        Behavior: Behavior normal.    Vitals:   05/03/18 0823  BP: (!) 119/56  Pulse: (!) 58  Temp: 97.8 F (36.6 C)  TempSrc: Oral  SpO2: 99%  Weight: 233 lb 3.2 oz (105.8 kg)  Height: _0  (1.753 m)        Assessment & Plan:    Sergey Ishler is a 72 y.o. male Annual wellness exam, subsequent.  -  anticipatory guidance as below in AVS, screening labs if needed. Health maintenance items as above in HPI discussed/recommended as applicable.  - no concerning responses on depression, fall, or functional status screening. Any positive responses noted as above. Advanced directives discussed as in CH[facility].    Prostate cancer ([place]) - Plan: PSA  -Check PSA, follow-up with urology if any increases and for ongoing care with current prostate cancer treatment.  Essential hypertension - Plan: Comprehensive metabolic panel, amLODipine (NORVASC) 10 MG tablet, valsartan (DIOVAN) 320 MG tablet  -Overall stable, tolerating current regimen.  No changes  Type 2 diabetes mellitus without complication, without long-term current use of insulin (H[facility]) - Plan: Hemoglobin A1c, metFORMIN (GLUCOPHAGE) 500 MG tablet  -Check A1c, continue same dose metformin for now.  Hyperlipidemia, unspecified hyperlipidemia type - Plan: Lipid panel,  rosuvastatin (CRESTOR) 20 MG tablet  -  Stable, tolerating current regimen. Medications refilled. Labs pending as above.   CLL (chronic lymphocytic leukemia) ([place]) - Plan: CBC   Social stressors discussed.  Continue follow-up with psychiatrist and counselor.  If resources needed from social work to discuss options if father needed care, advised to let me know.  Meds ordered this encounter  Medications  . amLODipine (NORVASC) 10 MG tablet    Sig: Take 1 tablet (10 mg total) by mouth daily.    Dispense:  90 tablet    Refill:  1  . metFORMIN (GLUCOPHAGE) 500 MG tablet    Sig: Take 1 tablet (500 mg total) by mouth every evening.    Dispense:  90 tablet    Refill:  1  . rosuvastatin (CRESTOR) 20 MG tablet    Sig: Take 1 tablet (20 mg total) by mouth daily.    Dispense:  90 tablet    Refill:  1  . valsartan (DIOVAN) 320 MG tablet    Sig: TAKE 1 TABLET BY MOUTH EVERY DAY    Dispense:  90 tablet    Refill:  2   Patient Instructions   Thanks for coming in today.  No med changes at this time.  Let me know if you need some resources from social work for some of the concerns we discussed.   Preventive Care 39 Years and Older, Male Preventive care refers to lifestyle choices and visits with your health care provider that can promote health and wellness. What does preventive care include?  A yearly physical exam. This is also called an annual well check.  Dental exams once or twice a year.  Routine eye exams. Ask your health care provider how often you should have your eyes checked.  Personal lifestyle choices, including: ? Daily care of your teeth and gums. ? Regular physical activity. ? Eating a healthy diet. ? Avoiding tobacco and drug use. ? Limiting alcohol use. ? Practicing safe sex. ? Taking low doses of aspirin every day. ? Taking vitamin and mineral supplements as recommended by your health care provider. What happens during an annual well check? The services and  screenings done by your health care provider during your annual well check will depend on your age, overall health, lifestyle risk factors, and family history of disease. Counseling Your health care provider may ask you questions about your:  Alcohol use.  Tobacco use.  Drug use.  Emotional well-being.  Home and relationship well-being.  Sexual activity.  Eating habits.  History of falls.  Memory and ability to understand (cognition).  Work and work Statistician.  Screening You may have the following tests or measurements:  Height, weight, and BMI.  Blood pressure.  Lipid and cholesterol levels. These may be checked every 5 years, or more frequently if you are over 53 years old.  Skin check.  Lung cancer screening. You may have this screening every year starting at age 60 if you have a 30-pack-year history of smoking and currently smoke or have quit within the past 15 years.  Fecal occult blood test (FOBT) of the stool. You may have this test every year starting at age 65.  Flexible sigmoidoscopy or colonoscopy. You may have a sigmoidoscopy every 5 years or a colonoscopy every 10 years starting at age 32.  Prostate cancer screening. Recommendations will vary depending on your family history and other risks.  Hepatitis C blood test.  Hepatitis B blood test.  Sexually transmitted disease (STD) testing.  Diabetes screening. This is done by checking your blood sugar (glucose) after you have not eaten for a while (fasting). You may have this done every 1-3 years.  Abdominal aortic aneurysm (AAA) screening. You may need this if you are a current or former smoker.  Osteoporosis. You may be screened starting at age 59 if you are at high risk.  Talk with your health care provider about your test results, treatment options, and if necessary, the need for more tests. Vaccines Your health care provider may recommend certain vaccines, such as:  Influenza vaccine. This is  recommended every year.  Tetanus, diphtheria, and acellular pertussis (Tdap, Td) vaccine. You may need a Td booster every 10 years.  Varicella vaccine. You may need this if you have not been vaccinated.  Zoster vaccine. You may need this after age 61.  Measles, mumps, and rubella (MMR) vaccine. You may need at least one dose of MMR if you were born in 1957 or later. You may also need a second dose.  Pneumococcal 13-valent conjugate (PCV13) vaccine. One dose is recommended after age 57.  Pneumococcal polysaccharide (PPSV23) vaccine. One dose is recommended after age 40.  Meningococcal vaccine. You may need this if you have certain conditions.  Hepatitis A vaccine. You may need this if you have certain conditions or if you travel or work in places where you may be exposed to hepatitis A.  Hepatitis B vaccine. You may need this if you have certain conditions or if you travel or work in places where you may be exposed to hepatitis B.  Haemophilus influenzae type b (Hib) vaccine. You may need this if you have certain risk factors.  Talk to your health care provider about which screenings and vaccines you need and how often you need them. This information is not intended to replace advice given to you by your health care provider. Make sure you discuss any questions you have with your health care provider. Document Released: 06/04/2015 Document Revised: 01/26/2016 Document Reviewed: 03/09/2015 Elsevier Interactive Patient Education  [employer].   If you have lab work done today you will be contacted with your lab results within the next 2 weeks.  If you have not heard from [place] then please contact us. The fastest way to get your results is to register for My Chart.   IF you received an x-ray today, you will receive an invoice from [facility]. Please contact [facility] at [phone] with questions or concerns regarding your invoice.   IF you received labwork  today, you will receive an invoice from [place]. Please contact [facility] at [phone] with questions or concerns regarding your invoice.   Our  billing staff will not be able to assist you with questions regarding bills from these companies.  You will be contacted with the lab results as soon as they are available. The fastest way to get your results is to activate your My Chart account. Instructions are located on the last page of this paperwork. If you have not heard from [place] regarding the results in 2 weeks, please contact this office.      Signed,   [name], MD [facility].  05/04/18 10:14 PM

## 2018-05-04 ENCOUNTER — Encounter: Payer: Self-pay | Admitting: Family Medicine

## 2018-05-04 LAB — COMPREHENSIVE METABOLIC PANEL
ALK PHOS: 46 IU/L (ref 39–117)
ALT: 30 IU/L (ref 0–44)
AST: 23 IU/L (ref 0–40)
Albumin/Globulin Ratio: 2 (ref 1.2–2.2)
Albumin: 4.2 g/dL (ref 3.5–4.8)
BILIRUBIN TOTAL: 0.2 mg/dL (ref 0.0–1.2)
BUN/Creatinine Ratio: 15 (ref 10–24)
BUN: 15 mg/dL (ref 8–27)
CO2: 23 mmol/L (ref 20–29)
Calcium: 9.6 mg/dL (ref 8.6–10.2)
Chloride: 106 mmol/L (ref 96–106)
Creatinine, Ser: 0.97 mg/dL (ref 0.76–1.27)
GFR calc Af Amer: 91 mL/min/{1.73_m2} (ref >59)
GFR calc non Af Amer: 79 mL/min/{1.73_m2} (ref >59)
Globulin, Total: 2.1 g/dL (ref 1.5–4.5)
Glucose: 109 mg/dL — ABNORMAL HIGH (ref 65–99)
POTASSIUM: 4.8 mmol/L (ref 3.5–5.2)
Sodium: 144 mmol/L (ref 134–144)
Total Protein: 6.3 g/dL (ref 6.0–8.5)

## 2018-05-04 LAB — CBC
Hematocrit: 40.2 % (ref 37.5–51.0)
Hemoglobin: 13.8 g/dL (ref 13.0–17.7)
MCH: 31.2 pg (ref 26.6–33.0)
MCHC: 34.3 g/dL (ref 31.5–35.7)
MCV: 91 fL (ref 79–97)
Platelets: 245 10*3/uL (ref 150–450)
RBC: 4.43 x10E6/uL (ref 4.14–5.80)
RDW: 13.4 % (ref 12.3–15.4)
WBC: 14.7 10*3/uL — ABNORMAL HIGH (ref 3.4–10.8)

## 2018-05-04 LAB — LIPID PANEL
CHOL/HDL RATIO: 4.7 ratio (ref 0.0–5.0)
Cholesterol, Total: 170 mg/dL (ref 100–199)
HDL: 36 mg/dL — AB (ref >39)
LDL CALC: 91 mg/dL (ref 0–99)
TRIGLYCERIDES: 215 mg/dL — AB (ref 0–149)
VLDL CHOLESTEROL CAL: 43 mg/dL — AB (ref 5–40)

## 2018-05-04 LAB — HEMOGLOBIN A1C
Est. average glucose Bld gHb Est-mCnc: 134 mg/dL
HEMOGLOBIN A1C: 6.3 % — AB (ref 4.8–5.6)

## 2018-05-04 LAB — PSA

## 2018-05-06 ENCOUNTER — Ambulatory Visit (HOSPITAL_COMMUNITY): Payer: Medicare Other | Admitting: Licensed Clinical Social Worker

## 2018-05-06 DIAGNOSIS — H401114 Primary open-angle glaucoma, right eye, indeterminate stage: Secondary | ICD-10-CM | POA: Diagnosis not present

## 2018-05-06 DIAGNOSIS — H2511 Age-related nuclear cataract, right eye: Secondary | ICD-10-CM | POA: Diagnosis not present

## 2018-05-06 DIAGNOSIS — Z961 Presence of intraocular lens: Secondary | ICD-10-CM | POA: Diagnosis not present

## 2018-05-06 DIAGNOSIS — H401122 Primary open-angle glaucoma, left eye, moderate stage: Secondary | ICD-10-CM | POA: Diagnosis not present

## 2018-05-07 ENCOUNTER — Encounter (HOSPITAL_COMMUNITY): Payer: Self-pay | Admitting: Licensed Clinical Social Worker

## 2018-05-07 ENCOUNTER — Other Ambulatory Visit: Payer: Self-pay | Admitting: Cardiovascular Disease

## 2018-05-07 ENCOUNTER — Ambulatory Visit (INDEPENDENT_AMBULATORY_CARE_PROVIDER_SITE_OTHER): Payer: Medicare Other | Admitting: Licensed Clinical Social Worker

## 2018-05-07 DIAGNOSIS — F411 Generalized anxiety disorder: Secondary | ICD-10-CM | POA: Diagnosis not present

## 2018-05-07 DIAGNOSIS — I25709 Atherosclerosis of coronary artery bypass graft(s), unspecified, with unspecified angina pectoris: Secondary | ICD-10-CM

## 2018-05-07 DIAGNOSIS — F331 Major depressive disorder, recurrent, moderate: Secondary | ICD-10-CM

## 2018-05-07 NOTE — Progress Notes (Signed)
   THERAPIST PROGRESS NOTE  Session Time: 9:05am-10:00am  Participation Level: Active  Behavioral Response: NeatAlertDepressed and Irritable  Type of Therapy: Individual Therapy  Treatment Goals addressed: Improve psychiatric symptoms, elevate mood (decreased irritability, increased enjoyment of activities), Improve unhelpful thought patterns, emotional regulation skills (reduce temper outburst), Interpersonal relationship skills  Interventions: Motivational Interviewing and Other: Grounding and Mindfulness techniques  Summary: Rodney Friedman is a 72 y.o. male who presents with Major Depressive Disorder, moderate and Generalized Anxiety Disorder  Suicidal/Homicidal: No without intent/plan  Therapist Response:  Rodney Friedman met with clinician for individual therapy. Rodney Friedman discussed his psychiatric symptoms and current life events. Rodney Friedman shared that he continues to stress over his financial problems and worries about the IRS. However, he reports the gabapentin has helped take the edge off. He reports only taking it at night to help him sleep. Clinician discussed the element of control he has over this situation and referred him back to his AA teachings of one day at a time. Clinician also identified the use of positive attention and thought stopping in order to identify good things in life. Rodney Friedman discussed his concerns about his father and noted that he may only have 1-2 years left with him. He identified sad feelings and concerns about loneliness in pre-acceptance of his father's eventual death. Clinician encouraged Rodney Friedman to enjoy every day and to attempt to find something positive in his daily interactions.   Plan: Return again in 1-2 weeks.  Diagnosis:     Axis I: Major depressive disorder, moderate and Generalized Anxiety Disorder   [name], LCSW 05/07/2018

## 2018-05-09 ENCOUNTER — Other Ambulatory Visit: Payer: Self-pay | Admitting: Family Medicine

## 2018-05-09 DIAGNOSIS — I10 Essential (primary) hypertension: Secondary | ICD-10-CM

## 2018-05-10 NOTE — Telephone Encounter (Signed)
Please advise 

## 2018-05-10 NOTE — Telephone Encounter (Signed)
Please send message to Dr. Carlota Raspberry who is this patient's PCP.  Thanks.

## 2018-05-11 NOTE — Telephone Encounter (Signed)
Change completed.

## 2018-05-14 ENCOUNTER — Other Ambulatory Visit: Payer: Self-pay | Admitting: Cardiovascular Disease

## 2018-05-16 ENCOUNTER — Other Ambulatory Visit: Payer: Self-pay | Admitting: Cardiovascular Disease

## 2018-05-16 MED ORDER — VALSARTAN 320 MG PO TABS: 320.0000 mg | ORAL_TABLET | Freq: Every day | ORAL | 1 refills | Status: DC

## 2018-05-16 NOTE — Telephone Encounter (Signed)
Rx sent to prefer pharmacy as requested. Rx for irbesartan dc

## 2018-05-16 NOTE — Telephone Encounter (Signed)
°*  STAT* If patient is at the pharmacy, call can be transferred to refill team.   1. Which medications need to be refilled? (please list name of each medication and dose if known)   Valsartan 320 mg  2. Which pharmacy/location (including street and city if local pharmacy) is medication to be sent to? [facility] [place]   3. Do they need a 30 day or 90 day supply? 90 days   He said they have the medication now.  He states did send a message in my chart.

## 2018-05-20 ENCOUNTER — Ambulatory Visit (HOSPITAL_COMMUNITY): Payer: Medicare Other | Admitting: Licensed Clinical Social Worker

## 2018-05-21 ENCOUNTER — Ambulatory Visit (INDEPENDENT_AMBULATORY_CARE_PROVIDER_SITE_OTHER): Payer: Medicare Other | Admitting: Psychiatry

## 2018-05-21 ENCOUNTER — Encounter (HOSPITAL_COMMUNITY): Payer: Self-pay | Admitting: Psychiatry

## 2018-05-21 VITALS — BP 130/78 | Ht 69.0 in | Wt 228.0 lb

## 2018-05-21 DIAGNOSIS — F324 Major depressive disorder, single episode, in partial remission: Secondary | ICD-10-CM

## 2018-05-21 DIAGNOSIS — I25810 Atherosclerosis of coronary artery bypass graft(s) without angina pectoris: Secondary | ICD-10-CM | POA: Diagnosis not present

## 2018-05-21 MED ORDER — GABAPENTIN 300 MG PO CAPS: 300.0000 mg | ORAL_CAPSULE | Freq: Every day | ORAL | 3 refills | Status: DC

## 2018-05-21 MED ORDER — BUPROPION HCL ER (XL) 300 MG PO TB24: ORAL_TABLET | ORAL | 2 refills | Status: DC

## 2018-05-21 NOTE — Progress Notes (Signed)
Psychiatric Initial Adult Assessment   Patient Identification: Rodney Friedman MRN:  [id number] Date of Evaluation:  05/21/2018 Referral Source: Dr. Nyoka Cowden Chief Complaint: Need follow-up care   Visit Diagnosis: Bipolar disorder, substance use disorder No diagnosis found.  History of Present Illness: Today the patient seems a bit better.  He is less anxious.  Everything is on hold.  He is not drinking any at all.  He denies being depressed.  His father is stable and well.  The patient is not sure how he will adjust to reduction in income to pay back a tax bill.  The patient says the Neurontin is helping a great deal at night.  When he took it in the morning it made him sleep during the day.  Just taking 300 mg of Neurontin at night gets him a good night of sleep and he feels better.  He is eating fairly well.  He denies any problems with his thinking and concentration.  He is not suicidal.  He has had some cardiac issues and recently saw his cardiologist and had his medications adjusted.  The patient has atrial fibrillation.  He denies any chest pain or shortness of breath.  The rest of his family seems to be okay.  He denies being suicidal.  I believe he is functioning.  He is handling the holidays fairly well. Substance Abuse History in the last 12 months:  Yes.    Consequences of Substance Abuse: NA  Past Medical History:  Past Medical History:  Diagnosis Date  . Adenomatous colon polyp   . Alcoholism ([place])   . Anxiety   . Atrial fibrillation ([place])   . CAD (coronary artery disease)   . Cataract    removed left eye   . 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 Depression   . Diabetes mellitus without complication ([place])   . Diverticulosis   . Eye abnormality    Macular scarring R eye  . Glaucoma   . Heart murmur   . HLD (hyperlipidemia)   . Hypertension   . Leukemia ([place])    CLL  . Myocardial infarction ([place]) 2006  . Prostate cancer ([place]) 07/08/2013   [facility]- getting Lupron shot every 6 months - traces in prostate and 2 lymphnodes left hip- non focused traces per pt   . Sleep apnea   . Substance abuse ([place])   . 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 Arm Surgery Right    from door accident with glass  . CATARACT EXTRACTION W/ INTRAOCULAR LENS IMPLANT Left   . COLONOSCOPY    . 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 POLYPECTOMY      Family Psychiatric History:   Family History:  Family History  Problem Relation Age of Onset  . Colon cancer Mother   . Ovarian cancer Mother   . Uterine cancer Mother   . Hypertension Father   . Prostate cancer Father   . Colon polyps Neg Hx     Social History:   Social History   Socioeconomic History  . Marital status: Divorced    Spouse name: Not on file  . Number of children: 2  . Years of education: 50  . Highest education level: Not on file  Occupational History  . Occupation: Retired Tour manager  . Financial resource strain: Not on file  . Food insecurity:    Worry: Not on file  Inability: Not on file  . Transportation needs:    Medical: Not on file    Non-medical: Not on file  Tobacco Use  . Smoking status: Current Every Day Smoker    Packs/day: 0.50    Years: 50.00    Pack years: 25.00    Types: Cigarettes  . Smokeless tobacco: Never Used  Substance and Sexual Activity  . Alcohol use: No    Alcohol/week: 0.0 standard drinks  . Drug use: No  . Sexual activity: Not Currently  Lifestyle  . Physical activity:    Days per week: Not on file    Minutes per session: Not on file  . Stress: Not on file  Relationships  . Social connections:    Talks on phone: Not on file    Gets together: Not on file    Attends religious service: Not on file    Active member of club or organization: Not on file    Attends meetings of clubs or organizations: Not on file    Relationship status: Not on file  Other Topics  Concern  . Not on file  Social History Narrative   Lives at home w/ his father   Divorced   Right-handed   Education: College   No caffeine    Additional Social History:   Allergies:   Allergies  Allergen Reactions  . Other Rash    Allergen: "Plants and bushes while doing yard work"    Metabolic Disorder Labs: Lab Results  Component Value Date   HGBA1C 6.3 (H) 05/03/2018   MPG 117 02/11/2016   MPG 128 (H) 03/22/2015   No results found for: PROLACTIN Lab Results  Component Value Date   CHOL 170 05/03/2018   TRIG 215 (H) 05/03/2018   HDL 36 (L) 05/03/2018   CHOLHDL 4.7 05/03/2018   VLDL 41 (H) 03/10/2016   LDLCALC 91 05/03/2018   LDLCALC 61 11/02/2017     Current Medications: Current Outpatient Medications  Medication Sig Dispense Refill  . amLODipine (NORVASC) 10 MG tablet Take 1 tablet (10 mg total) by mouth daily. 90 tablet 1  . B Complex Vitamins (VITAMIN-B COMPLEX PO) Take by mouth.    Marland Kitchen BIOTIN PO Take by mouth.    Marland Kitchen buPROPion (WELLBUTRIN XL) 300 MG 24 hr tablet 1  qam 90 tablet 2  . Calcium Carbonate-Vit D-Min (CALCIUM 1200 PO) Take 1,200 mg by mouth daily.    Mariane Baumgarten Calcium (STOOL SOFTENER PO) Take 1 tablet as needed by mouth.    . gabapentin (NEURONTIN) 300 MG capsule Take 1 capsule (300 mg total) by mouth at bedtime. 1 qhs 90 capsule 3  . ketoconazole (NIZORAL) 2 % shampoo Apply 1 application topically 2 (two) times a week. 120 mL 1  . latanoprost (XALATAN) 0.005 % ophthalmic solution Once a day every second day    . Leuprolide Acetate, 6 Month, (LUPRON) 45 MG injection Inject 45 mg into the muscle every 6 (six) months.    Marland Kitchen LYCOPENE PO Take 8 mg by mouth daily.    . metFORMIN (GLUCOPHAGE) 500 MG tablet Take 1 tablet (500 mg total) by mouth every evening. 90 tablet 1  . metoprolol succinate (TOPROL-XL) 200 MG 24 hr tablet Take 0.5 tablets (100 mg total) by mouth 2 (two) times daily. Take with or immediately following a meal. 90 tablet 3  . Omega-3  Fatty Acids (FISH OIL) 1000 MG CAPS Take 1,000 mg by mouth daily.    . rosuvastatin (CRESTOR) 20 MG tablet  Take 1 tablet (20 mg total) by mouth daily. 90 tablet 1  . valsartan (DIOVAN) 320 MG tablet Take 1 tablet (320 mg total) by mouth daily. 90 tablet 1  . vitamin B-12 (CYANOCOBALAMIN) 500 MCG tablet Take 500 mcg by mouth daily.    . vitamin C (ASCORBIC ACID) 500 MG tablet Take 500 mg by mouth daily.    Marland Kitchen warfarin (COUMADIN) 4 MG tablet TAKE 1/2 TO 1 TABLET DAILY AS DIRECTED BY COUMADIN CLINIC 90 tablet 3   Current Facility-Administered Medications  Medication Dose Route Frequency Provider Last Rate Last Dose  . 0.9 %  sodium chloride infusion  500 mL Intravenous Once Doran Stabler, MD        Neurologic: Headache: No Seizure: No Paresthesias:No  Musculoskeletal: Strength & Muscle Tone: within normal limits Gait & Station: normal Patient leans: Right  Psychiatric Specialty Exam: ROS  Blood pressure 130/78, height 5\' 9"  (1.753 m), weight 228 lb (103.4 kg).Body mass index is 33.67 kg/m.  General Appearance: Casual  Eye Contact:  Good  Speech:  Clear and Coherent  Volume:  Normal  Mood:  NA  Affect:  Appropriate  Thought Process:  Goal Directed  Orientation:  NA  Thought Content:  WDL  Suicidal Thoughts:  No  Homicidal Thoughts:  No  Memory:  Negative  Judgement:  Good  Insight:  Good  Psychomotor Activity:  Normal  Concentration:    Recall:  [place] of Knowledge:Good  Language: Good  Akathisia:  No  Handed:  Right  AIMS (if indicated):    Assets:  Desire for Improvement  ADL's:  Intact  Cognition: WNL  Sleep:      Treatment Plan Summary: 12/31/20192:18 PM  At this time the patient is doing better.  He seems to be handling the stress.  The iris bill is hanging over his head and is not sure how little play out.  He denies any significant new vegetative symptoms.  The Neurontin 300 mg at night is been helpful and will continue it for sleep.  He will  continue taking Wellbutrin as prescribed.  He denies any anhedonia.  He is functioning well.  The return to see me in 3 months.  I suspect by that time will have some clarity on his IRS situation.  He continues seeing his therapist here on a regular basis.Marland Kitchen

## 2018-05-24 ENCOUNTER — Ambulatory Visit (INDEPENDENT_AMBULATORY_CARE_PROVIDER_SITE_OTHER): Payer: Medicare Other | Admitting: Pharmacist Clinician (PhC)/ Clinical Pharmacy Specialist

## 2018-05-24 DIAGNOSIS — Z7901 Long term (current) use of anticoagulants: Secondary | ICD-10-CM | POA: Diagnosis not present

## 2018-05-24 DIAGNOSIS — I480 Paroxysmal atrial fibrillation: Secondary | ICD-10-CM | POA: Diagnosis not present

## 2018-05-24 DIAGNOSIS — I482 Chronic atrial fibrillation, unspecified: Secondary | ICD-10-CM

## 2018-05-24 LAB — POCT INR: INR: 2.7 (ref 2.0–3.0)

## 2018-05-24 NOTE — Patient Instructions (Addendum)
Description   Continue with 1 tablet daily except 1/2 tablet each Tuesday and Friday. Repeat INR in 6 weeks

## 2018-05-29 ENCOUNTER — Encounter (HOSPITAL_COMMUNITY): Payer: Self-pay | Admitting: Licensed Clinical Social Worker

## 2018-05-29 ENCOUNTER — Ambulatory Visit (INDEPENDENT_AMBULATORY_CARE_PROVIDER_SITE_OTHER): Payer: Medicare Other | Admitting: Licensed Clinical Social Worker

## 2018-05-29 DIAGNOSIS — F411 Generalized anxiety disorder: Secondary | ICD-10-CM

## 2018-05-29 DIAGNOSIS — F331 Major depressive disorder, recurrent, moderate: Secondary | ICD-10-CM

## 2018-05-29 NOTE — Progress Notes (Signed)
   THERAPIST PROGRESS NOTE  Session Time: 8:05am-9:00am  Participation Level: Active  Behavioral Response: NeatAlertAnxious and Depressed  Type of Therapy: Individual Therapy  Treatment Goals addressed: Improve psychiatric symptoms, elevate mood (decreased irritability, increased enjoyment of activities), Improve unhelpful thought patterns, emotional regulation skills (reduce temper outburst), Interpersonal relationship skills  Interventions: Motivational Interviewing and Other: Grounding and Mindfulness techniques  Summary: Rodney Friedman is a 73 y.o. male who presents with Major Depressive Disorder, moderate and Generalized Anxiety Disorder  Suicidal/Homicidal: No without intent/plan  Therapist Response:  [name] met with clinician for individual therapy. Rodney Friedman discussed his psychiatric symptoms and current life events. Rodney Friedman shared that he continues to struggle with the unknown regarding his IRS situation. He reports he has been stressed on a daily basis, spending the majority of his time worried about this. Clinician explored coping skills and ability to manage stress. Clinician explored gambling and noted increase in gambling over the past month. Rodney Friedman reports he has lost a good bit of money recently, which has not been helpful in his current financial situation. Clinician explored going to [employer], but Rodney Friedman reports he does not feel that he will ever be able to stop buying $6 of lottery tickets per week. Clinician encouraged [name] to utilized the gambling hotline sponsored by the lottery, in order to address urge to gamble.    Plan: Return again in 2-3 weeks.  Diagnosis:     Axis I: Major depressive disorder, moderate and Generalized Anxiety Disorder  [name], LCSW 05/29/2018

## 2018-06-03 ENCOUNTER — Ambulatory Visit (HOSPITAL_COMMUNITY): Payer: Medicare Other | Admitting: Licensed Clinical Social Worker

## 2018-06-17 ENCOUNTER — Encounter (HOSPITAL_COMMUNITY): Payer: Self-pay | Admitting: Licensed Clinical Social Worker

## 2018-06-17 ENCOUNTER — Ambulatory Visit (INDEPENDENT_AMBULATORY_CARE_PROVIDER_SITE_OTHER): Payer: Medicare Other | Admitting: Licensed Clinical Social Worker

## 2018-06-17 DIAGNOSIS — F411 Generalized anxiety disorder: Secondary | ICD-10-CM

## 2018-06-17 DIAGNOSIS — F331 Major depressive disorder, recurrent, moderate: Secondary | ICD-10-CM

## 2018-06-17 NOTE — Progress Notes (Signed)
   THERAPIST PROGRESS NOTE  Session Time: 9:00am-10:00am  Participation Level: Active  Behavioral Response: CasualAlertDepressed  Type of Therapy: Individual Therapy  Treatment Goals addressed: Improve psychiatric symptoms, elevate mood (decreased irritability, increased enjoyment of activities), Improve unhelpful thought patterns, emotional regulation skills (reduce temper outburst), Interpersonal relationship skills  Interventions: Motivational Interviewing and Other: Grounding and Mindfulness techniques  Summary: Rodney Friedman is a 73 y.o. male who presents with Major Depressive Disorder, moderate and Generalized Anxiety Disorder  Suicidal/Homicidal: No without intent/plan  Therapist Response:  [name] met with clinician for individual therapy. Rodney Friedman discussed his psychiatric symptoms and current life events. Rodney Friedman shared that he continues to struggle with depression and worry about the outcomes of his IRS case. He reports he has heard nothing and it's coming on the 5th month. Clinician explored options for following up with his [employer] attorney. Clinician discussed the long term effects of holding on to stress and worry, which has resulted in A Fib, as well as irritability and exhaustion. Clinician explored self care and discussed Rodney Friedman's use of wood carving as meditation.  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 Clinician validated this decision and identified that in the future this may be an option, but for now, it is too much.  Rodney Friedman also processed experience signing father up with the [place].   Plan: Return again in 1-2 weeks.  Diagnosis:     Axis I: Major depressive disorder, moderate and Generalized Anxiety Disorder   [name], LCSW 06/17/2018

## 2018-06-24 DIAGNOSIS — C61 Malignant neoplasm of prostate: Secondary | ICD-10-CM | POA: Diagnosis not present

## 2018-07-02 DIAGNOSIS — Z961 Presence of intraocular lens: Secondary | ICD-10-CM | POA: Diagnosis not present

## 2018-07-02 DIAGNOSIS — H2511 Age-related nuclear cataract, right eye: Secondary | ICD-10-CM | POA: Diagnosis not present

## 2018-07-02 DIAGNOSIS — H35713 Central serous chorioretinopathy, bilateral: Secondary | ICD-10-CM | POA: Diagnosis not present

## 2018-07-02 DIAGNOSIS — H401132 Primary open-angle glaucoma, bilateral, moderate stage: Secondary | ICD-10-CM | POA: Diagnosis not present

## 2018-07-02 DIAGNOSIS — H31011 Macula scars of posterior pole (postinflammatory) (post-traumatic), right eye: Secondary | ICD-10-CM | POA: Diagnosis not present

## 2018-07-03 ENCOUNTER — Ambulatory Visit (INDEPENDENT_AMBULATORY_CARE_PROVIDER_SITE_OTHER): Payer: Medicare Other | Admitting: Licensed Clinical Social Worker

## 2018-07-03 ENCOUNTER — Encounter (HOSPITAL_COMMUNITY): Payer: Self-pay | Admitting: Licensed Clinical Social Worker

## 2018-07-03 ENCOUNTER — Encounter: Payer: Self-pay | Admitting: Hematology and Oncology

## 2018-07-03 ENCOUNTER — Encounter: Payer: Self-pay | Admitting: Family Medicine

## 2018-07-03 DIAGNOSIS — C61 Malignant neoplasm of prostate: Secondary | ICD-10-CM | POA: Diagnosis not present

## 2018-07-03 DIAGNOSIS — F411 Generalized anxiety disorder: Secondary | ICD-10-CM

## 2018-07-03 DIAGNOSIS — F330 Major depressive disorder, recurrent, mild: Secondary | ICD-10-CM | POA: Diagnosis not present

## 2018-07-03 NOTE — Progress Notes (Signed)
   THERAPIST PROGRESS NOTE  Session Time: 9:00am-10:00am  Participation Level: Active  Behavioral Response: CasualAlertDepressed  Type of Therapy: Individual Therapy  Treatment Goals addressed: Improve psychiatric symptoms, elevate mood (decreased irritability, increased enjoyment of activities), Improve unhelpful thought patterns, emotional regulation skills (reduce temper outburst), Interpersonal relationship skills  Interventions: Motivational Interviewing and Other: Grounding and Mindfulness techniques  Summary: Rodney Friedman is a 73 y.o. male who presents with Major Depressive Disorder, moderate and Generalized Anxiety Disorder  Suicidal/Homicidal: No without intent/plan  Therapist Response:  [name] met with clinician for individual therapy. Rodney Friedman discussed his psychiatric symptoms and current life events. Rodney Friedman shared that he continues to wait for results from the IRS. However, he reports he has come to terms that he has little control or any other work to do on that front. Rodney Friedman discussed his happiness with results from blood test re: prostate health. Clinician discussed overall mood and coping. Rodney Friedman continues to endorse anxiety and worry about finances. Clinician utilized MI OARs to reflect and summarize thoughts and feelings. Clinician explored continued participation in recovery activities and identified the value of maintaining those connections.   Plan: Return again in 2 weeks.  Diagnosis:     Axis I: Major depressive disorder, moderate and Generalized Anxiety Disorder  [name], LCSW 07/03/2018

## 2018-07-05 ENCOUNTER — Ambulatory Visit (INDEPENDENT_AMBULATORY_CARE_PROVIDER_SITE_OTHER): Payer: Medicare Other | Admitting: Pharmacist

## 2018-07-05 DIAGNOSIS — Z7901 Long term (current) use of anticoagulants: Secondary | ICD-10-CM | POA: Diagnosis not present

## 2018-07-05 DIAGNOSIS — I480 Paroxysmal atrial fibrillation: Secondary | ICD-10-CM

## 2018-07-05 DIAGNOSIS — I482 Chronic atrial fibrillation, unspecified: Secondary | ICD-10-CM

## 2018-07-05 LAB — POCT INR: INR: 3.8 — AB (ref 2.0–3.0)

## 2018-07-17 ENCOUNTER — Encounter (HOSPITAL_COMMUNITY): Payer: Self-pay | Admitting: Licensed Clinical Social Worker

## 2018-07-17 ENCOUNTER — Ambulatory Visit (INDEPENDENT_AMBULATORY_CARE_PROVIDER_SITE_OTHER): Payer: Medicare Other | Admitting: Licensed Clinical Social Worker

## 2018-07-17 DIAGNOSIS — F411 Generalized anxiety disorder: Secondary | ICD-10-CM | POA: Diagnosis not present

## 2018-07-17 DIAGNOSIS — F331 Major depressive disorder, recurrent, moderate: Secondary | ICD-10-CM | POA: Diagnosis not present

## 2018-07-17 NOTE — Progress Notes (Signed)
   THERAPIST PROGRESS NOTE  Session Time: 9:00am-10:00am  Participation Level: Active  Behavioral Response: NeatAlertAnxious and Depressed  Type of Therapy: Individual Therapy  Treatment Goals addressed: Improve psychiatric symptoms, elevate mood (decreased irritability, increased enjoyment of activities), Improve unhelpful thought patterns, emotional regulation skills (reduce temper outburst), Interpersonal relationship skills  Interventions: Motivational Interviewing and Other: Grounding and Mindfulness techniques  Summary: Rodney Friedman is a 73 y.o. male who presents with Major Depressive Disorder, moderate and Generalized Anxiety Disorder  Suicidal/Homicidal: No without intent/plan  Therapist Response:  [name] met with clinician for individual therapy. Rodney Friedman discussed his psychiatric symptoms and current life events. Rodney Friedman shared that he has been making progress toward case management with the [place] [employer] [place] for his father. Rodney Friedman processed his plans and what he has been through trying to get things in place for dad when he has to have any heart surgery done. Clinician reflected the hard work that has been done and noted the value of checking things off the list and getting tasks completed. Clinician explored updates regarding the IRS inquiry. Rodney Friedman reports he spoke with a lawyer regarding his financial options. Clinician reflected the ease and control in which Rodney Friedman was able to express this and get these tasks completed.    Plan: Return again in 2-3 weeks.  Diagnosis:     Axis I: Major depressive disorder, moderate and Generalized Anxiety Disorder   [name], LCSW 07/17/2018

## 2018-07-18 ENCOUNTER — Other Ambulatory Visit: Payer: Self-pay | Admitting: Hematology and Oncology

## 2018-07-18 DIAGNOSIS — C9110 Chronic lymphocytic leukemia of B-cell type not having achieved remission: Secondary | ICD-10-CM

## 2018-07-22 ENCOUNTER — Telehealth: Payer: Self-pay | Admitting: Hematology and Oncology

## 2018-07-22 ENCOUNTER — Inpatient Hospital Stay: Payer: Medicare Other | Attending: Hematology and Oncology

## 2018-07-22 ENCOUNTER — Inpatient Hospital Stay (HOSPITAL_BASED_OUTPATIENT_CLINIC_OR_DEPARTMENT_OTHER): Payer: Medicare Other | Admitting: Hematology and Oncology

## 2018-07-22 DIAGNOSIS — Z7984 Long term (current) use of oral hypoglycemic drugs: Secondary | ICD-10-CM

## 2018-07-22 DIAGNOSIS — C61 Malignant neoplasm of prostate: Secondary | ICD-10-CM | POA: Diagnosis not present

## 2018-07-22 DIAGNOSIS — C9110 Chronic lymphocytic leukemia of B-cell type not having achieved remission: Secondary | ICD-10-CM

## 2018-07-22 DIAGNOSIS — Z7901 Long term (current) use of anticoagulants: Secondary | ICD-10-CM | POA: Insufficient documentation

## 2018-07-22 DIAGNOSIS — Z923 Personal history of irradiation: Secondary | ICD-10-CM

## 2018-07-22 DIAGNOSIS — C9111 Chronic lymphocytic leukemia of B-cell type in remission: Secondary | ICD-10-CM

## 2018-07-22 DIAGNOSIS — E119 Type 2 diabetes mellitus without complications: Secondary | ICD-10-CM

## 2018-07-22 DIAGNOSIS — Z79899 Other long term (current) drug therapy: Secondary | ICD-10-CM

## 2018-07-22 LAB — CBC WITH DIFFERENTIAL/PLATELET
Abs Immature Granulocytes: 0.07 10*3/uL (ref 0.00–0.07)
Basophils Absolute: 0.1 10*3/uL (ref 0.0–0.1)
Basophils Relative: 1 %
Eosinophils Absolute: 0.2 10*3/uL (ref 0.0–0.5)
Eosinophils Relative: 1 %
HCT: 39.8 % (ref 39.0–52.0)
HEMOGLOBIN: 13.1 g/dL (ref 13.0–17.0)
Immature Granulocytes: 0 %
Lymphocytes Relative: 55 %
Lymphs Abs: 9.6 10*3/uL — ABNORMAL HIGH (ref 0.7–4.0)
MCH: 30.1 pg (ref 26.0–34.0)
MCHC: 32.9 g/dL (ref 30.0–36.0)
MCV: 91.5 fL (ref 80.0–100.0)
MONOS PCT: 7 %
Monocytes Absolute: 1.2 10*3/uL — ABNORMAL HIGH (ref 0.1–1.0)
Neutro Abs: 6.2 10*3/uL (ref 1.7–7.7)
Neutrophils Relative %: 36 %
Platelets: 269 10*3/uL (ref 150–400)
RBC: 4.35 MIL/uL (ref 4.22–5.81)
RDW: 13.2 % (ref 11.5–15.5)
WBC: 17.4 10*3/uL — ABNORMAL HIGH (ref 4.0–10.5)
nRBC: 0 % (ref 0.0–0.2)

## 2018-07-22 MED ORDER — METFORMIN HCL 500 MG PO TABS: 500.0000 mg | ORAL_TABLET | Freq: Every day | ORAL | 1 refills | Status: DC

## 2018-07-22 NOTE — Telephone Encounter (Signed)
Gave avs and calendar ° °

## 2018-07-23 ENCOUNTER — Encounter: Payer: Self-pay | Admitting: Hematology and Oncology

## 2018-07-23 NOTE — Assessment & Plan Note (Signed)
From the CLL standpoint, he has no evidence of disease progression We discussed the signs and symptoms to watch out for I plan to see him back in 12 months with history, physical examination and blood work

## 2018-07-23 NOTE — Progress Notes (Signed)
[facility] OFFICE PROGRESS NOTE  Patient Care Team: Wendie Agreste, MD as PCP - General (Family Medicine) Croitoru, Dani Gobble, MD as PCP - Cardiology (Cardiology) Kathie Rhodes, MD as Consulting Physician (Urology) Croitoru, Dani Gobble, MD as Consulting Physician (Cardiology)  ASSESSMENT & PLAN:  CLL (chronic lymphocytic leukemia) From the CLL standpoint, he has no evidence of disease progression We discussed the signs and symptoms to watch out for I plan to see him back in 12 months with history, physical examination and blood work  Prostate cancer The patient is receiving Lupron injection under the care of urologist He has seen Dr. Alen Blew for discussion about other form of hormonal manipulation for treatment of prostate cancer For now, he is not symptomatic and I would defer to urologist for further follow-up   No orders of the defined types were placed in this encounter.   INTERVAL HISTORY: Please see below for problem oriented charting. He returns for further follow-up He is doing well with treatment for prostate cancer No new lymphadenopathy Denies recent infection, fever or chills  SUMMARY OF ONCOLOGIC HISTORY: Oncology History   Prostate cancer   Primary site: Prostate (Left)   Staging method: AJCC 7th Edition   Clinical: Stage IIB (T1c, N0, M0) signed by Heath Lark, MD on 07/09/2013 10:04 AM   Summary: Stage IIB (T1c, N0, M0)  CLL stage I. FISH normal     CLL (chronic lymphocytic leukemia) ([place])   07/08/2013 Pathology Results    Peripheral Blood Flow Cytometry - MONOCLONAL B-CELL POPULATION IDENTIFIED. Diagnosis Comment: The phenotypic features are consistent with chronic lymphocytic leukemia. Clinical correlation is recommended.     07/08/2013 Pathology Results    FISH panel is normal     Prostate cancer ([place])   07/12/2012 Tumor Marker    PSA was high at 9.6    09/30/2012 Procedure    He underwent prostate biopsy at confirm diagnosis of prostate  cancer.    11/11/2012 - 01/09/2013 Radiation Therapy    The patient completed radiation therapy to his prostate using IM RT technique    06/22/2017 PET scan    PET scan showed left external LN involvement along with activity within prostate     REVIEW OF SYSTEMS:   Constitutional: Denies fevers, chills or abnormal weight loss Eyes: Denies blurriness of vision Ears, nose, mouth, throat, and face: Denies mucositis or sore throat Respiratory: Denies cough, dyspnea or wheezes Cardiovascular: Denies palpitation, chest discomfort or lower extremity swelling Gastrointestinal:  Denies nausea, heartburn or change in bowel habits Skin: Denies abnormal skin rashes Lymphatics: Denies new lymphadenopathy or easy bruising Neurological:Denies numbness, tingling or new weaknesses Behavioral/Psych: Mood is stable, no new changes  All other systems were reviewed with the patient and are negative.  I have reviewed the past medical history, past surgical history, social history and family history with the patient and they are unchanged from previous note.  ALLERGIES:  is allergic to other.  MEDICATIONS:  Current Outpatient Medications  Medication Sig Dispense Refill  . amLODipine (NORVASC) 10 MG tablet Take 1 tablet (10 mg total) by mouth daily. 90 tablet 1  . B Complex Vitamins (VITAMIN-B COMPLEX PO) Take by mouth.    Marland Kitchen BIOTIN PO Take by mouth.    Marland Kitchen buPROPion (WELLBUTRIN XL) 300 MG 24 hr tablet 1  qam 90 tablet 2  . Calcium Carbonate-Vit D-Min (CALCIUM 1200 PO) Take 1,200 mg by mouth daily.    Mariane Baumgarten Calcium (STOOL SOFTENER PO) Take 1 tablet as needed  by mouth.    . gabapentin (NEURONTIN) 300 MG capsule Take 1 capsule (300 mg total) by mouth at bedtime. 1 qhs 90 capsule 3  . ketoconazole (NIZORAL) 2 % shampoo Apply 1 application topically 2 (two) times a week. 120 mL 1  . latanoprost (XALATAN) 0.005 % ophthalmic solution Once a day every second day    . Leuprolide Acetate, 6 Month, (LUPRON) 45 MG  injection Inject 45 mg into the muscle every 6 (six) months.    Marland Kitchen LYCOPENE PO Take 8 mg by mouth daily.    . metFORMIN (GLUCOPHAGE) 500 MG tablet Take 1 tablet (500 mg total) by mouth daily with breakfast. 90 tablet 1  . metoprolol succinate (TOPROL-XL) 200 MG 24 hr tablet Take 0.5 tablets (100 mg total) by mouth 2 (two) times daily. Take with or immediately following a meal. 90 tablet 3  . Omega-3 Fatty Acids (FISH OIL) 1000 MG CAPS Take 1,000 mg by mouth daily.    . rosuvastatin (CRESTOR) 20 MG tablet Take 1 tablet (20 mg total) by mouth daily. 90 tablet 1  . valsartan (DIOVAN) 320 MG tablet Take 1 tablet (320 mg total) by mouth daily. 90 tablet 1  . vitamin B-12 (CYANOCOBALAMIN) 500 MCG tablet Take 500 mcg by mouth daily.    . vitamin C (ASCORBIC ACID) 500 MG tablet Take 500 mg by mouth daily.    Marland Kitchen warfarin (COUMADIN) 4 MG tablet TAKE 1/2 TO 1 TABLET DAILY AS DIRECTED BY COUMADIN CLINIC 90 tablet 3   Current Facility-Administered Medications  Medication Dose Route Frequency Provider Last Rate Last Dose  . 0.9 %  sodium chloride infusion  500 mL Intravenous Once Doran Stabler, MD        PHYSICAL EXAMINATION: ECOG PERFORMANCE STATUS: 1 - Symptomatic but completely ambulatory  Vitals:   07/22/18 0814  BP: (!) 132/56  Pulse: 65  Resp: 18  Temp: 98 F (36.7 C)  SpO2: 99%   Filed Weights   07/22/18 0814  Weight: 230 lb 3.2 oz (104.4 kg)    GENERAL:alert, no distress and comfortable SKIN: skin color, texture, turgor are normal, no rashes or significant lesions EYES: normal, Conjunctiva are pink and non-injected, sclera clear OROPHARYNX:no exudate, no erythema and lips, buccal mucosa, and tongue normal  NECK: supple, thyroid normal size, non-tender, without nodularity LYMPH:  no palpable lymphadenopathy in the cervical, axillary or inguinal LUNGS: clear to auscultation and percussion with normal breathing effort HEART: Noted mild ejection systolic murmur on the left sternal  border, regular rate and rhythm and no lower extremity edema ABDOMEN:abdomen soft, non-tender and normal bowel sounds Musculoskeletal:no cyanosis of digits and no clubbing  NEURO: alert & oriented x 3 with fluent speech, no focal motor/sensory deficits  LABORATORY DATA:  I have reviewed the data as listed    Component Value Date/Time   NA 144 05/03/2018 1018   NA 141 01/04/2015 0808   K 4.8 05/03/2018 1018   K 4.3 01/04/2015 0808   CL 106 05/03/2018 1018   CO2 23 05/03/2018 1018   CO2 21 (L) 01/04/2015 0808   GLUCOSE 109 (H) 05/03/2018 1018   GLUCOSE 89 03/10/2016 0830   GLUCOSE 115 01/04/2015 0808   BUN 15 05/03/2018 1018   BUN 18.7 01/04/2015 0808   CREATININE 0.97 05/03/2018 1018   CREATININE 1.16 03/10/2016 0830   CREATININE 0.9 01/04/2015 0808   CALCIUM 9.6 05/03/2018 1018   CALCIUM 9.0 01/04/2015 0808   PROT 6.3 05/03/2018 1018   PROT  6.3 (L) 01/04/2015 0808   ALBUMIN 4.2 05/03/2018 1018   ALBUMIN 3.7 01/04/2015 0808   AST 23 05/03/2018 1018   AST 19 01/04/2015 0808   ALT 30 05/03/2018 1018   ALT 33 01/04/2015 0808   ALKPHOS 46 05/03/2018 1018   ALKPHOS 47 01/04/2015 0808   BILITOT 0.2 05/03/2018 1018   BILITOT 0.23 01/04/2015 0808   GFRNONAA 79 05/03/2018 1018   GFRNONAA 70 02/11/2016 1526   GFRAA 91 05/03/2018 1018   GFRAA 81 02/11/2016 1526    No results found for: SPEP, UPEP  Lab Results  Component Value Date   WBC 17.4 (H) 07/22/2018   NEUTROABS 6.2 07/22/2018   HGB 13.1 07/22/2018   HCT 39.8 07/22/2018   MCV 91.5 07/22/2018   PLT 269 07/22/2018      Chemistry      Component Value Date/Time   NA 144 05/03/2018 1018   NA 141 01/04/2015 0808   K 4.8 05/03/2018 1018   K 4.3 01/04/2015 0808   CL 106 05/03/2018 1018   CO2 23 05/03/2018 1018   CO2 21 (L) 01/04/2015 0808   BUN 15 05/03/2018 1018   BUN 18.7 01/04/2015 0808   CREATININE 0.97 05/03/2018 1018   CREATININE 1.16 03/10/2016 0830   CREATININE 0.9 01/04/2015 0808      Component Value  Date/Time   CALCIUM 9.6 05/03/2018 1018   CALCIUM 9.0 01/04/2015 0808   ALKPHOS 46 05/03/2018 1018   ALKPHOS 47 01/04/2015 0808   AST 23 05/03/2018 1018   AST 19 01/04/2015 0808   ALT 30 05/03/2018 1018   ALT 33 01/04/2015 0808   BILITOT 0.2 05/03/2018 1018   BILITOT 0.23 01/04/2015 0808      All questions were answered. The patient knows to call the clinic with any problems, questions or concerns. No barriers to learning was detected.  I spent 10 minutes counseling the patient face to face. The total time spent in the appointment was 15 minutes and more than 50% was on counseling and review of test results  Heath Lark, MD 07/23/2018 1:37 PM

## 2018-07-23 NOTE — Assessment & Plan Note (Signed)
The patient is receiving Lupron injection under the care of urologist He has seen Dr. Alen Blew for discussion about other form of hormonal manipulation for treatment of prostate cancer For now, he is not symptomatic and I would defer to urologist for further follow-up

## 2018-07-26 ENCOUNTER — Ambulatory Visit (INDEPENDENT_AMBULATORY_CARE_PROVIDER_SITE_OTHER): Payer: Medicare Other | Admitting: Cardiovascular Disease

## 2018-07-26 ENCOUNTER — Other Ambulatory Visit: Payer: Self-pay | Admitting: Pharmacist Clinician (PhC)/ Clinical Pharmacy Specialist

## 2018-07-26 ENCOUNTER — Other Ambulatory Visit: Payer: Self-pay

## 2018-07-26 ENCOUNTER — Telehealth: Payer: Self-pay

## 2018-07-26 ENCOUNTER — Encounter: Payer: Self-pay | Admitting: Cardiovascular Disease

## 2018-07-26 ENCOUNTER — Ambulatory Visit (INDEPENDENT_AMBULATORY_CARE_PROVIDER_SITE_OTHER): Payer: Medicare Other | Admitting: Pharmacist Clinician (PhC)/ Clinical Pharmacy Specialist

## 2018-07-26 VITALS — BP 134/58 | HR 57 | Ht 69.0 in | Wt 229.4 lb

## 2018-07-26 DIAGNOSIS — E669 Obesity, unspecified: Secondary | ICD-10-CM | POA: Diagnosis not present

## 2018-07-26 DIAGNOSIS — R0989 Other specified symptoms and signs involving the circulatory and respiratory systems: Secondary | ICD-10-CM | POA: Diagnosis not present

## 2018-07-26 DIAGNOSIS — Z7901 Long term (current) use of anticoagulants: Secondary | ICD-10-CM | POA: Diagnosis not present

## 2018-07-26 DIAGNOSIS — I25118 Atherosclerotic heart disease of native coronary artery with other forms of angina pectoris: Secondary | ICD-10-CM | POA: Diagnosis not present

## 2018-07-26 DIAGNOSIS — I10 Essential (primary) hypertension: Secondary | ICD-10-CM | POA: Diagnosis not present

## 2018-07-26 DIAGNOSIS — I482 Chronic atrial fibrillation, unspecified: Secondary | ICD-10-CM

## 2018-07-26 DIAGNOSIS — I350 Nonrheumatic aortic (valve) stenosis: Secondary | ICD-10-CM | POA: Diagnosis not present

## 2018-07-26 DIAGNOSIS — G4733 Obstructive sleep apnea (adult) (pediatric): Secondary | ICD-10-CM | POA: Diagnosis not present

## 2018-07-26 DIAGNOSIS — F17200 Nicotine dependence, unspecified, uncomplicated: Secondary | ICD-10-CM

## 2018-07-26 DIAGNOSIS — I25709 Atherosclerosis of coronary artery bypass graft(s), unspecified, with unspecified angina pectoris: Secondary | ICD-10-CM

## 2018-07-26 DIAGNOSIS — I480 Paroxysmal atrial fibrillation: Secondary | ICD-10-CM

## 2018-07-26 DIAGNOSIS — E782 Mixed hyperlipidemia: Secondary | ICD-10-CM

## 2018-07-26 DIAGNOSIS — C9110 Chronic lymphocytic leukemia of B-cell type not having achieved remission: Secondary | ICD-10-CM

## 2018-07-26 DIAGNOSIS — E1169 Type 2 diabetes mellitus with other specified complication: Secondary | ICD-10-CM | POA: Diagnosis not present

## 2018-07-26 DIAGNOSIS — I25708 Atherosclerosis of coronary artery bypass graft(s), unspecified, with other forms of angina pectoris: Secondary | ICD-10-CM

## 2018-07-26 DIAGNOSIS — Z659 Problem related to unspecified psychosocial circumstances: Secondary | ICD-10-CM | POA: Diagnosis not present

## 2018-07-26 LAB — POCT INR: INR: 2.3 (ref 2.0–3.0)

## 2018-07-26 MED ORDER — METOPROLOL SUCCINATE ER 200 MG PO TB24: 100.0000 mg | ORAL_TABLET | Freq: Two times a day (BID) | ORAL | 3 refills | Status: DC

## 2018-07-26 MED ORDER — WARFARIN SODIUM 4 MG PO TABS: ORAL_TABLET | ORAL | 1 refills | Status: DC

## 2018-07-26 MED ORDER — VALSARTAN 320 MG PO TABS: 320.0000 mg | ORAL_TABLET | Freq: Every day | ORAL | 3 refills | Status: DC

## 2018-07-26 MED ORDER — ROSUVASTATIN CALCIUM 20 MG PO TABS: 20.0000 mg | ORAL_TABLET | Freq: Every day | ORAL | 3 refills | Status: DC

## 2018-07-26 MED ORDER — AMLODIPINE BESYLATE 10 MG PO TABS: 10.0000 mg | ORAL_TABLET | Freq: Every day | ORAL | 3 refills | Status: DC

## 2018-07-26 MED ORDER — EZETIMIBE 10 MG PO TABS: 10.0000 mg | ORAL_TABLET | Freq: Every day | ORAL | 3 refills | Status: DC

## 2018-07-26 NOTE — Patient Instructions (Addendum)
Medication Instructions:  Continue same medications If you need a refill on your cardiac medications before your next appointment, please call your pharmacy.   Lab work: None ordered   Testing/Procedures: None ordered  Follow-Up: At [facility], you and your health needs are our priority.  As part of our continuing mission to provide you with exceptional heart care, we have created designated Provider Care Teams.  These Care Teams include your primary Cardiologist (physician) and Advanced Practice Providers (APPs -  Physician Assistants and Nurse Practitioners) who all work together to provide you with the care you need, when you need it. . Follow up appointment with Dr.Croitoru in 6 months  Call 3 months before to schedule   Check with your pharmacy  Zetia 10 mg daily                                                 Repatha 140 twice a day                                                 Vascepa 2 grams twice a day  [employer] social worker will be calling you

## 2018-07-26 NOTE — Progress Notes (Signed)
Cardiology Office Note    Date:  07/27/2018   ID:  Rodney Friedman, DOB Aug 12, 1945, MRN [id number]  PCP:  Wendie Agreste, MD  Cardiologist:   Sanda Klein, MD   chief complaint: palpitations, fatigue   History of Present Illness:  Rodney Friedman is a 73 y.o. male with CAD and remote CABG, paroxysmal atrial fibrillation, mild aortic stenosis, carotid artery stenosis, hypertension, hyperlipidemia and type 2 diabetes mellitus.  He is doing better than of his last appointment and today is in sinus rhythm.  He seems a little less nervous, but is still under stress since his IRS tax issues are still not resolved.  He is becoming more and more aware of the fact that he is his 36 year old father is his only support and is trying to make sure that he has contingency plans in the case of his own illness.  I offered to refer him to a Education officer, museum.  Otherwise, he has no cardiovascular complaints.The patient specifically denies any chest pain at rest exertion, dyspnea at rest or with exertion, orthopnea, paroxysmal nocturnal dyspnea, syncope, palpitations, focal neurological deficits, intermittent claudication, lower extremity edema, unexplained weight gain, cough, hemoptysis or wheezing.  He remains mild obese with a BMI of 32, although he has lost some weight.  He continues to smoke roughly 10 cigarettes a day.  He points out that he has been abstinent from alcohol for 18 years and is well versed in the theoretical aspects of addiction since he has a Masters degree on the topic.  He does not feel equipped to stop smoking at this time.  He denies any falls, injuries or bleeding problems.  Today his INR is in therapeutic range, although he has had some volatility in the last 6 months.  He has resisted transition to a direct oral anticoagulant due to cost concerns.    Labs from May 03, 2018 show an LDL cholesterol of 91, as well as elevated triglycerides (215) and low HDL (36), despite  well-controlled glycemia (hemoglobin A1c 6.3%, managed with diet and metformin).  He has a history of coronary disease and underwent 3 vessel bypass surgery in [place] in 2006 ("heartburn" and left antecubital pain ) in July 2014 he had an episode of atrial fibrillation. He has significant left ventricular hypertrophy and very mild aortic valve stenosis. He has preserved left ventricular systolic function although there is some degree of anterior hypokinesis by previous echo. His nuclear stress test in June 2013 showed normal pattern of perfusion and an ejection fraction of 52%. He has bilateral carotid bruits that are reportedly due to external carotid artery stenoses. His last carotid ultrasound was performed in September 2017 and showed no significant internal carotid stenosis.  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 He has treated prostate cancer and is followed by Dr. Alvy Bimler.  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 Unfortunately he still smokes roughly 10 cigarettes a day , which he believes he needs due to anxiety.   Past Medical History:  Diagnosis Date  . Adenomatous colon polyp   . Alcoholism ([place])   . Anxiety   . Atrial fibrillation ([place])   . CAD (coronary artery disease)   . Cataract    removed left eye   . CLL (chronic lymphocytic leukemia) ([place]) 07/08/2013  . Depression   . Diabetes mellitus without  complication ([place])   . Diverticulosis   . Eye abnormality    Macular scarring R eye  . Glaucoma   . Heart murmur   . HLD (hyperlipidemia)   . Hypertension   . Leukemia ([place])    CLL  . Myocardial infarction ([place]) 2006  . Prostate cancer ([place]) 07/08/2013   [facility]- getting Lupron shot every 6 months - traces in prostate and 2 lymphnodes left hip- non focused traces per pt   . Sleep apnea   . Substance abuse ([place])   . 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 Arm Surgery Right    from door accident with glass  . CATARACT EXTRACTION W/ INTRAOCULAR LENS IMPLANT Left   . COLONOSCOPY    . 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 POLYPECTOMY      Current Medications: Outpatient Medications Prior to Visit  Medication Sig Dispense Refill  . B Complex Vitamins (VITAMIN-B COMPLEX PO) Take by mouth.    Marland Kitchen BIOTIN PO Take by mouth.    Marland Kitchen buPROPion (WELLBUTRIN XL) 300 MG 24 hr tablet 1  qam 90 tablet 2  . Calcium Carbonate-Vit D-Min (CALCIUM 1200 PO) Take 1,200 mg by mouth daily.    Mariane Baumgarten Calcium (STOOL SOFTENER PO) Take 1 tablet as needed by mouth.    . gabapentin (NEURONTIN) 100 MG capsule Take 100 mg by mouth daily.     Marland Kitchen ketoconazole (NIZORAL) 2 % shampoo Apply 1 application topically 2 (two) times a week. 120 mL 1  . latanoprost (XALATAN) 0.005 % ophthalmic solution Once a day every second day    . Leuprolide Acetate, 6 Month, (LUPRON) 45 MG injection Inject 45 mg into the muscle every 6 (six) months.    Marland Kitchen LYCOPENE PO Take 8 mg by mouth daily.    . metFORMIN (GLUCOPHAGE) 500 MG tablet Take 1 tablet (500 mg total) by mouth daily with breakfast. 90 tablet 1  . Omega-3 Fatty Acids (FISH OIL) 1000 MG CAPS Take 1,000 mg by mouth daily.    . vitamin B-12 (CYANOCOBALAMIN) 500 MCG tablet Take 500 mcg by mouth daily.    . vitamin C (ASCORBIC ACID) 500 MG tablet Take 500 mg by mouth daily.    Marland Kitchen amLODipine (NORVASC) 10 MG tablet Take 1 tablet (10 mg total) by mouth daily. 90 tablet 1  . metoprolol succinate (TOPROL-XL) 200 MG 24 hr tablet Take 0.5 tablets (100 mg total) by mouth 2 (two) times daily. Take with or immediately following a meal. 90 tablet 3  . rosuvastatin (CRESTOR) 20 MG tablet Take 1 tablet (20 mg total) by mouth daily. 90 tablet 1  . valsartan (DIOVAN) 320 MG tablet Take 1 tablet (320 mg total) by mouth daily. 90 tablet 1  . warfarin (COUMADIN) 4 MG tablet TAKE  1/2 TO 1 TABLET DAILY AS DIRECTED BY COUMADIN CLINIC 90 tablet 3  . gabapentin (NEURONTIN) 300 MG capsule Take 1 capsule (300 mg total) by mouth at bedtime. 1 qhs (Patient taking differently: Take 100 mg by mouth at bedtime. 1 qhs) 90 capsule 3   Facility-Administered Medications Prior to Visit  Medication Dose Route Frequency Provider Last Rate Last Dose  . 0.9 %  sodium chloride infusion  500 mL Intravenous Once Doran Stabler, MD         Allergies:   Other   Social History   Socioeconomic History  . Marital status: Divorced  Spouse name: Not on file  . Number of children: 2  . Years of education: 8  . Highest education level: Not on file  Occupational History  . Occupation: Retired Tour manager  . Financial resource strain: Not on file  . Food insecurity:    Worry: Not on file    Inability: Not on file  . Transportation needs:    Medical: Not on file    Non-medical: Not on file  Tobacco Use  . Smoking status: Current Every Day Smoker    Packs/day: 0.50    Years: 50.00    Pack years: 25.00    Types: Cigarettes  . Smokeless tobacco: Never Used  Substance and Sexual Activity  . Alcohol use: No    Alcohol/week: 0.0 standard drinks  . Drug use: No  . Sexual activity: Not Currently  Lifestyle  . Physical activity:    Days per week: Not on file    Minutes per session: Not on file  . Stress: Not on file  Relationships  . Social connections:    Talks on phone: Not on file    Gets together: Not on file    Attends religious service: Not on file    Active member of club or organization: Not on file    Attends meetings of clubs or organizations: Not on file    Relationship status: Not on file  Other Topics Concern  . Not on file  Social History Narrative   Lives at home w/ his father   Divorced   Right-handed   Education: College   No caffeine     Family History:  The patient's family history includes Colon cancer in his mother; Hypertension in his  father; Ovarian cancer in his mother; Prostate cancer in his father; Uterine cancer in his mother.   ROS:   Please see the history of present illness.    All other systems are reviewed and are negative  PHYSICAL EXAM:   VS:  BP (!) 134/58   Pulse (!) 57   Ht 5\' 9"  (1.753 m)   Wt 229 lb 6.4 oz (104.1 kg)   SpO2 98%   BMI 33.88 kg/m      General: Alert, oriented x3, no distress, mildly obese Head: no evidence of trauma, PERRL, EOMI, no exophtalmos or lid lag, no myxedema, no xanthelasma; normal ears, nose and oropharynx Neck: normal jugular venous pulsations and no hepatojugular reflux; brisk carotid pulses without delay and no carotid bruits Chest: clear to auscultation, no signs of consolidation by percussion or palpation, normal fremitus, symmetrical and full respiratory excursions Cardiovascular: normal position and quality of the apical impulse, regular rhythm, normal first and second heart sounds, 2/6 early peaking systolic ejection murmur in the aortic focus no diastolic murmurs, rubs or gallops Abdomen: no tenderness or distention, no masses by palpation, no abnormal pulsatility or arterial bruits, normal bowel sounds, no hepatosplenomegaly Extremities: no clubbing, cyanosis or edema; 2+ radial, ulnar and brachial pulses bilaterally; 2+ right femoral, posterior tibial and dorsalis pedis pulses; 2+ left femoral, posterior tibial and dorsalis pedis pulses; no subclavian or femoral bruits Neurological: grossly nonfocal Psych: Normal mood and affect   Wt Readings from Last 3 Encounters:  07/26/18 229 lb 6.4 oz (104.1 kg)  07/22/18 230 lb 3.2 oz (104.4 kg)  05/03/18 233 lb 3.2 oz (105.8 kg)      Studies/Labs Reviewed:   EKG:  EKG is not ordered today.  Recent Labs: 05/03/2018: ALT 30; BUN 15; Creatinine,  Ser 0.97; Potassium 4.8; Sodium 144 07/22/2018: Hemoglobin 13.1; Platelets 269   Lipid Panel    Component Value Date/Time   CHOL 170 05/03/2018 1018   TRIG 215 (H)  05/03/2018 1018   HDL 36 (L) 05/03/2018 1018   CHOLHDL 4.7 05/03/2018 1018   CHOLHDL 5.8 (H) 03/10/2016 0830   VLDL 41 (H) 03/10/2016 0830   LDLCALC 91 05/03/2018 1018    ASSESSMENT:    1. Paroxysmal atrial fibrillation (HCC)   2. Long term (current) use of anticoagulants   3. Coronary artery disease of native artery of native heart with stable angina pectoris ([place])   4. Aortic valve stenosis, nonrheumatic   5. Mixed hyperlipidemia   6. Diabetes mellitus type 2 in obese (HCC)   7. Smoking   8. Bilateral carotid bruits   9. OSA (obstructive sleep apnea)   10. Essential hypertension   11. Mild obesity   12. Other social stressor   13. CLL (chronic lymphocytic leukemia) (HCC)      PLAN:  In order of problems listed above:  1. AFib: Mild bradycardia today, regular rhythm, on higher dose of beta-blocker (increased since at his last appointment he had atrial fibrillation with rapid ventricular response).  CHADSVasc 4 (age, HTN, CAD, DM).  We will continue the current dose of bradycardia 2. Warfarin: Well-tolerated, no bleeding complications.  Ttherapeutic INR today. 3. CAD: Even when he was tachycardic at his last appointment he did not have angina on combined antianginal therapy with calcium channel blocker and beta-blocker. 4. AS: Mild, not yet hemodynamically significant. 5. HLP: LDL is not at target of less than 70.  Discussed various options including Repatha, Zetia and Vascepa.  He looked into the cost of these medications and chose to start Zetia 10 mg once daily.  We will recheck labs in about 3 months.  Reviewed the fact that his HDL and triglycerides will not improve with medication, but with weight loss and more physical activity. 6. DM: Recent excellent hemoglobin A1c on metformin monotherapy.  I am confident that his diabetes could be "cured" with weight loss". 7. Smoking: He continues to smoke 12 cigarettes a day.  Discussed cessation again today, but he is not ready.  He  is proud to report that he has been sober from alcohol and drugs for 18 years and still goes to [employer]. 8. Carotid bruits: Duplex study has shown that his carotid bruits are related to external carotid artery stenoses.  He only has mild plaque in the internal carotids.  No neurological complaints. 9. OSA: Intolerant to CPAP.  He does not want to try this again. 10. Obesity: I think he needs to make more time to pay attention to his own health and he is to eat a healthier diet.  11. HTN: Fair control. 12. Social issues: Increased emotional stress due to the chronic concerns about caring for his very elderly father and it is acute concerns with the IRS.  Will ask our social worker to get in touch with him.  I suggested he contact [facility]. 13. CLL:  Not requiring treatment. No anemia or thrombocytopenia.  Followed by Dr. Alvy Bimler.    Medication Adjustments/Labs and Tests Ordered: Current medicines are reviewed at length with the patient today.  Concerns regarding medicines are outlined above.  Medication changes, Labs and Tests ordered today are listed in the Patient Instructions below. Patient Instructions  Medication Instructions:  Continue same medications If you need a refill on your cardiac medications  before your next appointment, please call your pharmacy.   Lab work: None ordered   Testing/Procedures: None ordered  Follow-Up: At [facility], you and your health needs are our priority.  As part of our continuing mission to provide you with exceptional heart care, we have created designated Provider Care Teams.  These Care Teams include your primary Cardiologist (physician) and Advanced Practice Providers (APPs -  Physician Assistants and Nurse Practitioners) who all work together to provide you with the care you need, when you need it. . Follow up appointment with Dr.Kaelea Gathright in 6 months  Call 3 months before to schedule   Check with your pharmacy  Zetia 10 mg  daily                                                 Repatha 140 twice a day                                                 Vascepa 2 grams twice a day  [place] worker will be calling you    Signed, Sanda Klein, MD  07/27/2018 12:19 PM    [address], [place], [place]  [address] Phone: [phone]; Fax: [phone]

## 2018-07-26 NOTE — Telephone Encounter (Signed)
Spoke to patient Zetia 10 mg daily sent to pharmacy.Dr.Croitoru advised to have fasting lipid panel in 3 months.Lab orders mailed.

## 2018-07-26 NOTE — Telephone Encounter (Signed)
generic for zetia: Ezetimibe 10mg  Tablet. Please have staff order Ezetimibe 10mg  Tablet 90 days at my [facility] per our consultation this morning. Thank you.

## 2018-07-27 DIAGNOSIS — E119 Type 2 diabetes mellitus without complications: Secondary | ICD-10-CM | POA: Insufficient documentation

## 2018-07-27 DIAGNOSIS — E1169 Type 2 diabetes mellitus with other specified complication: Secondary | ICD-10-CM | POA: Insufficient documentation

## 2018-07-27 DIAGNOSIS — E669 Obesity, unspecified: Secondary | ICD-10-CM

## 2018-07-27 DIAGNOSIS — I10 Essential (primary) hypertension: Secondary | ICD-10-CM | POA: Insufficient documentation

## 2018-07-27 DIAGNOSIS — Z659 Problem related to unspecified psychosocial circumstances: Secondary | ICD-10-CM | POA: Insufficient documentation

## 2018-08-07 ENCOUNTER — Ambulatory Visit (INDEPENDENT_AMBULATORY_CARE_PROVIDER_SITE_OTHER): Payer: Medicare Other | Admitting: Licensed Clinical Social Worker

## 2018-08-07 ENCOUNTER — Other Ambulatory Visit: Payer: Self-pay

## 2018-08-07 ENCOUNTER — Encounter (HOSPITAL_COMMUNITY): Payer: Self-pay | Admitting: Licensed Clinical Social Worker

## 2018-08-07 DIAGNOSIS — F331 Major depressive disorder, recurrent, moderate: Secondary | ICD-10-CM

## 2018-08-07 DIAGNOSIS — F411 Generalized anxiety disorder: Secondary | ICD-10-CM | POA: Diagnosis not present

## 2018-08-07 NOTE — Progress Notes (Signed)
   THERAPIST PROGRESS NOTE  Session Time: 11:00am-12:00pm  Participation Level: Active  Behavioral Response: CasualAlertDepressed  Type of Therapy: Individual Therapy  Treatment Goals addressed: Improve psychiatric symptoms, elevate mood (decreased irritability, increased enjoyment of activities), Improve unhelpful thought patterns, emotional regulation skills (reduce temper outburst), Interpersonal relationship skills  Interventions: Motivational Interviewing and Other: Grounding and Mindfulness techniques  Summary: Rodney Friedman is a 73 y.o. male who presents with Major Depressive Disorder, moderate and Generalized Anxiety Disorder  Suicidal/Homicidal: No without intent/plan  Therapist Response:  Rodney Friedman met with clinician for individual therapy. Rodney Friedman discussed his psychiatric symptoms and current life events. Rodney Friedman shared updates in his IRS situation and noted that he will owe half of what he originally thought, which is much more manageable for him. Clinician explored details of the work he had done in order to get the evidence submitted to IRS and to change his payment situation. He reports he is still trying to get another several thousand dollars off the bill. Rodney Friedman reports he continues to muddle through getting his father situated with the [place]. Clinician offered supportive counseling and validated frustration with the bureaucracy he is dealing with. Clinician encouraged Rodney Friedman to continue trudging along and to feel proud of himself when these tasks get accomplished. Clinician offered options for setting up supports for himself and dad. Rodney Friedman processed concerns about coronavirus and identified steps he is taking to ensure health and enough supplies.   Plan: Return again in 1-2 weeks.  Diagnosis:     Axis I: Major depressive disorder, moderate and Generalized Anxiety Disorder   [name], LCSW 08/07/2018

## 2018-08-12 ENCOUNTER — Encounter: Payer: Self-pay | Admitting: Family Medicine

## 2018-08-21 ENCOUNTER — Ambulatory Visit (INDEPENDENT_AMBULATORY_CARE_PROVIDER_SITE_OTHER): Payer: Medicare Other | Admitting: Licensed Clinical Social Worker

## 2018-08-21 ENCOUNTER — Encounter (HOSPITAL_COMMUNITY): Payer: Self-pay | Admitting: Licensed Clinical Social Worker

## 2018-08-21 ENCOUNTER — Other Ambulatory Visit: Payer: Self-pay

## 2018-08-21 DIAGNOSIS — F331 Major depressive disorder, recurrent, moderate: Secondary | ICD-10-CM | POA: Diagnosis not present

## 2018-08-21 DIAGNOSIS — F411 Generalized anxiety disorder: Secondary | ICD-10-CM

## 2018-08-21 NOTE — Progress Notes (Signed)
Virtual Visit via Telephone Note  I connected with Rodney Friedman on 08/21/18 at  9:00 AM EDT by telephone and verified that I am speaking with the correct person using two identifiers.   I discussed the limitations, risks, security and privacy concerns of performing an evaluation and management service by telephone and the availability of in person appointments. I also discussed with the patient that there may be a patient responsible charge related to this service. The patient expressed understanding and agreed to proceed.   Type of Therapy: Individual Therapy  Treatment Goals addressed: Improve psychiatric symptoms, elevate mood (decreased irritability, increased enjoyment of activities), Improve unhelpful thought patterns, emotional regulation skills (reduce temper outburst), Interpersonal relationship skills  Interventions: Motivational Interviewing and Other: Grounding and Mindfulness techniques  Summary: Rodney Friedman is a 73 y.o. male who presents with Major Depressive Disorder, moderate and Generalized Anxiety Disorder  Suicidal/Homicidal: No without intent/plan  Therapist Response:  Rodney Friedman met with clinician for individual therapy. Rodney Friedman discussed his psychiatric symptoms and current life events. Rodney Friedman shared that he continues to work on getting his Rodney Friedman set up with the [place]. He reports some progress on that front, noting that he has been connected with the social worker who is taking good care of them and ensuring all the appropriate steps are being taken. Clinician processed these moves and noted that he should feel very proud of himself for completing these tasks, which are frustrating, but have to be done. Clinician explored updates on IRS and noted that the fees estimated are closer to 1/4-1/2 the previous amount owed, which is a huge difference and relief. He reports his advocate through Rodney Friedman is continuing to work to reduce the expense.  Rodney Friedman discussed thoughts and feelings  about COVID-19 and identified some concerns and precautions. Clinician urged Rodney Friedman to practice safe social distancing and to reduce face to face contact with others in order to protect him and his Rodney Friedman. Clinician explored depression and noted increased napping. Clinician encouraged some light physical activity and time outside to improve mood, as well as reduced time watching the news and more art.   Plan: Return again in 2 weeks.  Diagnosis:     Axis I: Major depressive disorder, moderate and Generalized Anxiety Disorder   I discussed the assessment and treatment plan with the patient. The patient was provided an opportunity to ask questions and all were answered. The patient agreed with the plan and demonstrated an understanding of the instructions.   The patient was advised to call back or seek an in-person evaluation if the symptoms worsen or if the condition fails to improve as anticipated.  I provided 45 minutes of non-face-to-face time during this encounter.   [name], LCSW

## 2018-08-23 ENCOUNTER — Telehealth (INDEPENDENT_AMBULATORY_CARE_PROVIDER_SITE_OTHER): Payer: Medicare Other | Admitting: Psychiatry

## 2018-08-23 ENCOUNTER — Other Ambulatory Visit: Payer: Self-pay

## 2018-08-23 DIAGNOSIS — F3342 Major depressive disorder, recurrent, in full remission: Secondary | ICD-10-CM | POA: Diagnosis not present

## 2018-08-23 DIAGNOSIS — I25709 Atherosclerosis of coronary artery bypass graft(s), unspecified, with unspecified angina pectoris: Secondary | ICD-10-CM

## 2018-08-23 MED ORDER — BUPROPION HCL ER (XL) 300 MG PO TB24: ORAL_TABLET | ORAL | 2 refills | Status: DC

## 2018-08-23 MED ORDER — GABAPENTIN 100 MG PO CAPS: 100.0000 mg | ORAL_CAPSULE | Freq: Every day | ORAL | 3 refills | Status: DC

## 2018-08-23 NOTE — Progress Notes (Signed)
Psychiatric Initial Adult Assessment   Patient Identification: Rodney Friedman MRN:  [id number] Date of Evaluation:  08/23/2018 Referral Source: Dr. Nyoka Cowden Chief Complaint: Need follow-up care   Visit Diagnosis: Bipolar disorder, substance use disorder Bipolar disorder  History of Present Illness: This patient is seen routinely in this office for bipolar disorder.  Today this is a phone meeting.  The patient is actually doing very well.  He does not feel all that isolated although he is staying at home with his elderly father all the time.  His father actually seems to be doing stable.  The patient again his father describe himself as having a chronic mild cold that they have had for over a month.  They are not coughing and they have no fever.  They are living contact with anybody else.  The patient's health is generally very good.  He is seeing a cardiologist and recently he had his medications increased for his atrial fibrillation.  The patient is sleeping and eating well.  He denies depression or anxiety.  His energy level is reasonably good.  The issue with his money that is due to the IRS is being dealt with fairly well.  Is been able to be reduced from 40,000 down to $20,000 and the patient says he is hopeful that he will get down to $10,000 which is what he can handle.  He seems to be positive about this and not as desperate.  The patient of course denies the use of alcohol or drugs.  He is unable to go to [place] meetings at this time but is learning how to use Zoom.  The patient has no psychotic symptoms at all.  His energy level is reasonably good.  He is sleeping well taking Neurontin.  Substance Abuse History in the last 12 months:  Yes.    Consequences of Substance Abuse: NA  Past Medical History:  Past Medical History:  Diagnosis Date  . Adenomatous colon polyp   . Alcoholism ([place])   . Anxiety   . Atrial fibrillation ([place])   . CAD (coronary artery disease)   . Cataract    removed  left eye   . 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 Depression   . Diabetes mellitus without complication ([place])   . Diverticulosis   . Eye abnormality    Macular scarring R eye  . Glaucoma   . Heart murmur   . HLD (hyperlipidemia)   . Hypertension   . Leukemia ([place])    CLL  . Myocardial infarction ([place]) 2006  . Prostate cancer ([place]) 07/08/2013   [facility]- getting Lupron shot every 6 months - traces in prostate and 2 lymphnodes left hip- non focused traces per pt   . Sleep apnea   . Substance abuse ([place])   . 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 Arm Surgery Right    from door accident with glass  . CATARACT EXTRACTION W/ INTRAOCULAR LENS IMPLANT Left   . COLONOSCOPY    . 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 POLYPECTOMY      Family Psychiatric History:   Family History:  Family History  Problem Relation Age of Onset  . Colon cancer Mother   . Ovarian cancer Mother   . Uterine cancer Mother   . Hypertension Father   . Prostate cancer Father   . Colon polyps Neg Hx     Social History:   Social  History   Socioeconomic History  . Marital status: Divorced    Spouse name: Not on file  . Number of children: 2  . Years of education: 95  . Highest education level: Not on file  Occupational History  . Occupation: Retired Tour manager  . Financial resource strain: Not on file  . Food insecurity:    Worry: Not on file    Inability: Not on file  . Transportation needs:    Medical: Not on file    Non-medical: Not on file  Tobacco Use  . Smoking status: Current Every Day Smoker    Packs/day: 0.50    Years: 50.00    Pack years: 25.00    Types: Cigarettes  . Smokeless tobacco: Never Used  Substance and Sexual Activity  . Alcohol use: No    Alcohol/week: 0.0 standard drinks  . Drug use: No  . Sexual activity: Not Currently  Lifestyle  . Physical activity:    Days per week:  Not on file    Minutes per session: Not on file  . Stress: Not on file  Relationships  . Social connections:    Talks on phone: Not on file    Gets together: Not on file    Attends religious service: Not on file    Active member of club or organization: Not on file    Attends meetings of clubs or organizations: Not on file    Relationship status: Not on file  Other Topics Concern  . Not on file  Social History Narrative   Lives at home w/ his father   Divorced   Right-handed   Education: College   No caffeine    Additional Social History:   Allergies:   Allergies  Allergen Reactions  . Other Rash    Allergen: "Plants and bushes while doing yard work"    Metabolic Disorder Labs: Lab Results  Component Value Date   HGBA1C 6.3 (H) 05/03/2018   MPG 117 02/11/2016   MPG 128 (H) 03/22/2015   No results found for: PROLACTIN Lab Results  Component Value Date   CHOL 170 05/03/2018   TRIG 215 (H) 05/03/2018   HDL 36 (L) 05/03/2018   CHOLHDL 4.7 05/03/2018   VLDL 41 (H) 03/10/2016   LDLCALC 91 05/03/2018   LDLCALC 61 11/02/2017     Current Medications: Current Outpatient Medications  Medication Sig Dispense Refill  . amLODipine (NORVASC) 10 MG tablet Take 1 tablet (10 mg total) by mouth daily. 90 tablet 3  . B Complex Vitamins (VITAMIN-B COMPLEX PO) Take by mouth.    Marland Kitchen BIOTIN PO Take by mouth.    Marland Kitchen buPROPion (WELLBUTRIN XL) 300 MG 24 hr tablet 1  qam 90 tablet 2  . Calcium Carbonate-Vit D-Min (CALCIUM 1200 PO) Take 1,200 mg by mouth daily.    Mariane Baumgarten Calcium (STOOL SOFTENER PO) Take 1 tablet as needed by mouth.    . ezetimibe (ZETIA) 10 MG tablet Take 1 tablet (10 mg total) by mouth daily. 90 tablet 3  . gabapentin (NEURONTIN) 100 MG capsule Take 100 mg by mouth daily.     Marland Kitchen ketoconazole (NIZORAL) 2 % shampoo Apply 1 application topically 2 (two) times a week. 120 mL 1  . latanoprost (XALATAN) 0.005 % ophthalmic solution Once a day every second day    .  Leuprolide Acetate, 6 Month, (LUPRON) 45 MG injection Inject 45 mg into the muscle every 6 (six) months.    Marland Kitchen LYCOPENE PO Take  8 mg by mouth daily.    . metFORMIN (GLUCOPHAGE) 500 MG tablet Take 1 tablet (500 mg total) by mouth daily with breakfast. 90 tablet 1  . metoprolol (TOPROL-XL) 200 MG 24 hr tablet Take 0.5 tablets (100 mg total) by mouth 2 (two) times daily. Take with or immediately following a meal. 90 tablet 3  . Omega-3 Fatty Acids (FISH OIL) 1000 MG CAPS Take 1,000 mg by mouth daily.    . rosuvastatin (CRESTOR) 20 MG tablet Take 1 tablet (20 mg total) by mouth daily. 90 tablet 3  . valsartan (DIOVAN) 320 MG tablet Take 1 tablet (320 mg total) by mouth daily. 90 tablet 3  . vitamin B-12 (CYANOCOBALAMIN) 500 MCG tablet Take 500 mcg by mouth daily.    . vitamin C (ASCORBIC ACID) 500 MG tablet Take 500 mg by mouth daily.    Marland Kitchen warfarin (COUMADIN) 4 MG tablet TAKE 1/2 TO 1 TABLET DAILY AS DIRECTED BY COUMADIN CLINIC 90 tablet 1   Current Facility-Administered Medications  Medication Dose Route Frequency Provider Last Rate Last Dose  . 0.9 %  sodium chloride infusion  500 mL Intravenous Once Doran Stabler, MD        Neurologic: Headache: No Seizure: No Paresthesias:No  Musculoskeletal: Strength & Muscle Tone: within normal limits Gait & Station: normal Patient leans: Right  Psychiatric Specialty Exam: ROS  There were no vitals taken for this visit.There is no height or weight on file to calculate BMI.  General Appearance: Casual  Eye Contact:  Good  Speech:  Clear and Coherent  Volume:  Normal  Mood:  NA  Affect:  Appropriate  Thought Process:  Goal Directed  Orientation:  NA  Thought Content:  WDL  Suicidal Thoughts:  No  Homicidal Thoughts:  No  Memory:  Negative  Judgement:  Good  Insight:  Good  Psychomotor Activity:  Normal  Concentration:    Recall:  [place] of Knowledge:Good  Language: Good  Akathisia:  No  Handed:  Right  AIMS (if indicated):     Assets:  Desire for Improvement  ADL's:  Intact  Cognition: WNL  Sleep:      Treatment Plan Summary: 4/3/20209:09 AM  This patient's first problem is that of clinical depression.  The patient takes Wellbutrin on a regular basis and is doing great.  He has no vegetative symptoms.  He denies anhedonia.  He generally is doing well.  His second problem is that of insomnia.  He does very well taking 300 mg of Neurontin.  The Neurontin together with Wellbutrin seem to work well for this individual.  The patient's third problem is that he has alcohol dependency disorder.  He utilizes AA for this condition.  I strongly encouraged him to stay in [place].  The patient is doing very well overall.  The patient will continue in therapy in this setting.

## 2018-09-04 ENCOUNTER — Ambulatory Visit (INDEPENDENT_AMBULATORY_CARE_PROVIDER_SITE_OTHER): Payer: Medicare Other | Admitting: Licensed Clinical Social Worker

## 2018-09-04 ENCOUNTER — Other Ambulatory Visit: Payer: Self-pay

## 2018-09-04 ENCOUNTER — Encounter (HOSPITAL_COMMUNITY): Payer: Self-pay | Admitting: Licensed Clinical Social Worker

## 2018-09-04 DIAGNOSIS — F330 Major depressive disorder, recurrent, mild: Secondary | ICD-10-CM | POA: Diagnosis not present

## 2018-09-04 DIAGNOSIS — F411 Generalized anxiety disorder: Secondary | ICD-10-CM | POA: Diagnosis not present

## 2018-09-04 NOTE — Progress Notes (Signed)
Virtual Visit via Telephone Note  I connected with Tavion Senkbeil on 09/04/18 at  9:00 AM EDT by telephone and verified that I am speaking with the correct person using two identifiers.   I discussed the limitations, risks, security and privacy concerns of performing an evaluation and management service by telephone and the availability of in person appointments. I also discussed with the patient that there may be a patient responsible charge related to this service. The patient expressed understanding and agreed to proceed.   Type of Therapy: Individual Therapy   Treatment Goals addressed: Improve psychiatric symptoms, elevate mood (decreased irritability, increased enjoyment of activities), Improve unhelpful thought patterns, emotional regulation skills (reduce temper outburst), Interpersonal relationship skills   Interventions: Motivational Interviewing and Other: Grounding and Mindfulness techniques   Summary: Eean Buss is a 73 y.o. male who presents with Major Depressive Disorder, moderate and Generalized Anxiety Disorder   Suicidal/Homicidal: No without intent/plan   Therapist Response:  [name] met with clinician for individual therapy. [place] discussed his psychiatric symptoms and current life events. Durand shared that he has been continuing to struggle with getting the little details ironed out with his father's care through [place], dealing with his own IRS issues, coping with changes to his medical providers, etc. Clinician identified that coping with these changes keeps him flexible and on his toes. Clinician also reminded him that he has the ability to cope with these issues and that this is just part of life. Clinician discussed concerns about COVID-19 and identified the importance of limiting social interactions, staying informed on changes to local rules, and also to maintain sanity by reducing the exposure to the 24-7 news cycle. Clinician identified a lot of frustration with his  archaic computer system and explored option to upgrade to a Chromebook or some other basic laptop in order to access Zoom, the internet, email, and basic word processing. Anuj agreed to look into his options, as it would likely enhance his life and reduce the frustration that he carries daily with trying to cope in 2020 with a computer from 20 years ago.    Plan: Return again in 2-3 weeks.   Diagnosis: Axis I: Major depressive disorder, moderate and Generalized Anxiety Disorder   I discussed the assessment and treatment plan with the patient. The patient was provided an opportunity to ask questions and all were answered. The patient agreed with the plan and demonstrated an understanding of the instructions.   The patient was advised to call back or seek an in-person evaluation if the symptoms worsen or if the condition fails to improve as anticipated.  I provided 45 minutes of non-face-to-face time during this encounter.   [name], LCSW

## 2018-09-18 ENCOUNTER — Ambulatory Visit (INDEPENDENT_AMBULATORY_CARE_PROVIDER_SITE_OTHER): Payer: Medicare Other | Admitting: Licensed Clinical Social Worker

## 2018-09-18 ENCOUNTER — Encounter (HOSPITAL_COMMUNITY): Payer: Self-pay | Admitting: Licensed Clinical Social Worker

## 2018-09-18 ENCOUNTER — Other Ambulatory Visit: Payer: Self-pay

## 2018-09-18 DIAGNOSIS — F330 Major depressive disorder, recurrent, mild: Secondary | ICD-10-CM

## 2018-09-18 DIAGNOSIS — F411 Generalized anxiety disorder: Secondary | ICD-10-CM

## 2018-09-18 NOTE — Progress Notes (Signed)
Virtual Visit via Telephone Note  I connected with Rodney Friedman on 09/18/18 at  9:00 AM EDT by telephone and verified that I am speaking with the correct person using two identifiers.   I discussed the limitations, risks, security and privacy concerns of performing an evaluation and management service by telephone and the availability of in person appointments. I also discussed with the patient that there may be a patient responsible charge related to this service. The patient expressed understanding and agreed to proceed.   Type of Therapy: Individual Therapy   Treatment Goals addressed: Improve psychiatric symptoms, elevate mood (decreased irritability, increased enjoyment of activities), Improve unhelpful thought patterns, emotional regulation skills (reduce temper outburst), Interpersonal relationship skills   Interventions: Motivational Interviewing and Other: Grounding and Mindfulness techniques   Summary: Rodney Friedman is a 73 y.o. male who presents with Major Depressive Disorder, mild and Generalized Anxiety Disorder   Suicidal/Homicidal: No without intent/plan   Therapist Response:  Rodney Friedman met with clinician for individual therapy via phone. Rodney Friedman does not have access to video conferencing. Rodney Friedman discussed his psychiatric symptoms and current life events. Rodney Friedman shared that he has been doing okay. He reports he and father had been sick on and off for about 8 weeks, but he was uncertain if it was LMBEM-75 or if it was just an extended flu. He reported interest in getting the antibody test. Clinician encouraged him to make contact with his doctor's office. Clinician explored anxiety and depression levels. Clinician identified that anxiety about traveling around town, getting lost, etc. Has hindered his willingness to travel to the [place] for his Coumadin blood test. Clinician utilized MI OARS to reflect and summarize thoughts and feelings. Clinician explored options and ways to get  around the driving. Clinician engaged in some case management, as well as problem solving using CBT.   Plan: Return again in 2 weeks.   Diagnosis: Axis I: Major depressive disorder, mild and Generalized Anxiety Disorder    I discussed the assessment and treatment plan with the patient. The patient was provided an opportunity to ask questions and all were answered. The patient agreed with the plan and demonstrated an understanding of the instructions.   The patient was advised to call back or seek an in-person evaluation if the symptoms worsen or if the condition fails to improve as anticipated.  I provided 45 minutes of non-face-to-face time during this encounter.   [name], LCSW

## 2018-09-30 ENCOUNTER — Telehealth: Payer: Self-pay

## 2018-09-30 NOTE — Telephone Encounter (Signed)
Will not accept blocked calls

## 2018-10-01 ENCOUNTER — Other Ambulatory Visit: Payer: Self-pay

## 2018-10-01 ENCOUNTER — Ambulatory Visit (INDEPENDENT_AMBULATORY_CARE_PROVIDER_SITE_OTHER): Payer: Medicare Other | Admitting: Pharmacist

## 2018-10-01 DIAGNOSIS — I480 Paroxysmal atrial fibrillation: Secondary | ICD-10-CM

## 2018-10-01 DIAGNOSIS — Z7901 Long term (current) use of anticoagulants: Secondary | ICD-10-CM

## 2018-10-01 DIAGNOSIS — I482 Chronic atrial fibrillation, unspecified: Secondary | ICD-10-CM | POA: Diagnosis not present

## 2018-10-01 LAB — POCT INR: INR: 2.7 (ref 2.0–3.0)

## 2018-10-02 ENCOUNTER — Ambulatory Visit (HOSPITAL_COMMUNITY): Payer: Medicare Other | Admitting: Licensed Clinical Social Worker

## 2018-10-03 ENCOUNTER — Ambulatory Visit (INDEPENDENT_AMBULATORY_CARE_PROVIDER_SITE_OTHER): Payer: Medicare Other | Admitting: Licensed Clinical Social Worker

## 2018-10-03 ENCOUNTER — Encounter (HOSPITAL_COMMUNITY): Payer: Self-pay | Admitting: Licensed Clinical Social Worker

## 2018-10-03 ENCOUNTER — Other Ambulatory Visit: Payer: Self-pay

## 2018-10-03 DIAGNOSIS — F330 Major depressive disorder, recurrent, mild: Secondary | ICD-10-CM

## 2018-10-03 DIAGNOSIS — F411 Generalized anxiety disorder: Secondary | ICD-10-CM

## 2018-10-03 NOTE — Progress Notes (Signed)
Virtual Visit via Telephone Note  I connected with Rodney Friedman on 10/03/18 at  8:00 AM EDT by telephone and verified that I am speaking with the correct person using two identifiers.    I discussed the limitations, risks, security and privacy concerns of performing an evaluation and management service by telephone and the availability of in person appointments. I also discussed with the patient that there may be a patient responsible charge related to this service. The patient expressed understanding and agreed to proceed.   Type of Therapy: Individual Therapy   Treatment Goals addressed: Improve psychiatric symptoms, elevate mood (decreased irritability, increased enjoyment of activities), Improve unhelpful thought patterns, emotional regulation skills (reduce temper outburst), Interpersonal relationship skills   Interventions: Motivational Interviewing and Other: Grounding and Mindfulness techniques   Summary: Rodney Friedman is a 73 y.o. male who presents with Major Depressive Disorder, moderate and Generalized Anxiety Disorder   Suicidal/Homicidal: No without intent/plan   Therapist Response:  [name] met with clinician for individual therapy. Session was telephonic due to not having access to a video materials. Rodney Friedman discussed his psychiatric symptoms and current life events. Rodney Friedman shared that he is dong okay, feeling the same as usual. He reports that he continues to care for his father. He did make it to get his Coumadin checked, which was a huge victory due to anxiety over getting lost in a different area of town. Clinician processed the experience and reflected the importance of this effort. Clinician explored options for making further steps out of his comfort zone, within reason. Rodney Friedman reported that it "wasn't that bad". Rodney Friedman reports ongoing stress about the IRS situation. Clinician assessed level of involvement in daily thoughts. Rodney Friedman reports a few little prayers a day have been  whispered just for help getting through this.  Clinician utilized MI OARS to reflect and summarize thoughts and feelings about father. Rodney Friedman processed concerns and frustration about father's very slow but noticeable decline, either cognitively or with his hearing. Clinician provided feedback about patience, understanding, and love when caring for his father.   Plan: Return again in 1-2 weeks.   Diagnosis: Axis I: Major depressive disorder, mild and Generalized Anxiety Disorder    I discussed the assessment and treatment plan with the patient. The patient was provided an opportunity to ask questions and all were answered. The patient agreed with the plan and demonstrated an understanding of the instructions.   The patient was advised to call back or seek an in-person evaluation if the symptoms worsen or if the condition fails to improve as anticipated.  I provided 45 minutes of non-face-to-face time during this encounter.   [name], LCSW

## 2018-10-16 ENCOUNTER — Encounter (HOSPITAL_COMMUNITY): Payer: Self-pay | Admitting: Licensed Clinical Social Worker

## 2018-10-16 ENCOUNTER — Other Ambulatory Visit: Payer: Self-pay

## 2018-10-16 ENCOUNTER — Ambulatory Visit (INDEPENDENT_AMBULATORY_CARE_PROVIDER_SITE_OTHER): Payer: Medicare Other | Admitting: Licensed Clinical Social Worker

## 2018-10-16 DIAGNOSIS — F330 Major depressive disorder, recurrent, mild: Secondary | ICD-10-CM

## 2018-10-16 DIAGNOSIS — F411 Generalized anxiety disorder: Secondary | ICD-10-CM | POA: Diagnosis not present

## 2018-10-16 NOTE — Progress Notes (Signed)
Virtual Visit via Telephone Note  I connected with Rodney Friedman on 10/16/18 at  9:00 AM EDT by telephone and verified that I am speaking with the correct person using two identifiers.    I discussed the limitations, risks, security and privacy concerns of performing an evaluation and management service by telephone and the availability of in person appointments. I also discussed with the patient that there may be a patient responsible charge related to this service. The patient expressed understanding and agreed to proceed.  Type of Therapy: Individual Therapy  Treatment Goals addressed: Improve psychiatric symptoms, elevate mood (decreased irritability, increased enjoyment of activities), Improve unhelpful thought patterns, emotional regulation skills (reduce temper outburst), Interpersonal relationship skills  Interventions: Motivational Interviewing and Other: Grounding and Mindfulness techniques  Summary: Rodney Friedman is a 73 y.o. male who presents with Major Depressive Disorder, mild and Generalized Anxiety Disorder  Suicidal/Homicidal: No without intent/plan  Therapist Response:  [name] met with clinician for individual therapy. Rodney Friedman discussed his psychiatric symptoms and current life events. Rodney Friedman shared that he has been continuing to do odds and ends around the house, trying to keep himself busy and keep his father healthy. Rodney Friedman processed an incident of getting extremely angry about not being able to correctly tie a knot to stake a tree. Clinician processed the event and utilized CBT to process thoughts. Clinician challenged thoughts and noted tendency to jump to the worst possible scenario. Clinician explored options for coping skills,such as taking a break, taking a deep breath, walking away. Rodney Friedman discussed concerns about the COVID 19 virus and discussed frustration with those who refuse to wear masks. However, he reports he has been trying to not watch too much news and to just stay  informed without overdoing it.   Plan: Return again in 1-2 weeks.  Diagnosis: Axis I: Major depressive disorder, moderate and Generalized Anxiety Disorder   I discussed the assessment and treatment plan with the patient. The patient was provided an opportunity to ask questions and all were answered. The patient agreed with the plan and demonstrated an understanding of the instructions.   The patient was advised to call back or seek an in-person evaluation if the symptoms worsen or if the condition fails to improve as anticipated.  I provided 55 minutes of non-face-to-face time during this encounter.   [name], LCSW

## 2018-10-21 ENCOUNTER — Encounter: Payer: Self-pay | Admitting: Family Medicine

## 2018-10-21 ENCOUNTER — Other Ambulatory Visit: Payer: Self-pay | Admitting: Family Medicine

## 2018-10-21 DIAGNOSIS — I10 Essential (primary) hypertension: Secondary | ICD-10-CM

## 2018-10-21 NOTE — Telephone Encounter (Signed)
Requested medication (s) are due for refill today: no  Requested medication (s) are on the active medication list: no  Last refill:  Med discontinued on 04/15/18  Future visit scheduled: yes  Notes to clinic:  Please see note from pharmacy. Dx code and clarification of strength needed.     Requested Prescriptions  Pending Prescriptions Disp Refills   metoprolol succinate (TOPROL-XL) 100 MG 24 hr tablet [Pharmacy Med Name: METOPROLOL SUCC ER 100 MG TAB] 90 tablet 1    Sig: TAKE 1 TABLET BY MOUTH EVERY DAY WITH OR IMMEDIATELY FOLLOWING A MEAL     Cardiovascular:  Beta Blockers Passed - 10/21/2018 10:00 AM      Passed - Last BP in normal range    BP Readings from Last 1 Encounters:  07/26/18 (!) 134/58         Passed - Last Heart Rate in normal range    Pulse Readings from Last 1 Encounters:  07/26/18 (!) 57         Passed - Valid encounter within last 6 months    Recent Outpatient Visits          5 months ago Medicare annual wellness visit, subsequent   Primary Care at [facility], Ranell Patrick, MD   11 months ago CLL (chronic lymphocytic leukemia) [facility])   [facility] at [facility], Ranell Patrick, MD   1 year ago Type 2 diabetes mellitus without complication, without long-term current use of insulin [facility])   Primary Care at [facility], Ranell Patrick, MD   1 year ago Type 2 diabetes mellitus without complication, without long-term current use of insulin [facility])   [facility] at [facility], Ranell Patrick, MD   1 year ago Type 2 diabetes mellitus without complication, without long-term current use of insulin [facility])   Primary [facility] at [facility], Ranell Patrick, MD      Future Appointments            In 1 week Carlota Raspberry Ranell Patrick, MD Primary Care at [place], [facility]

## 2018-10-30 ENCOUNTER — Ambulatory Visit (INDEPENDENT_AMBULATORY_CARE_PROVIDER_SITE_OTHER): Payer: Medicare Other | Admitting: Family Medicine

## 2018-10-30 ENCOUNTER — Encounter: Payer: Self-pay | Admitting: Family Medicine

## 2018-10-30 ENCOUNTER — Other Ambulatory Visit: Payer: Self-pay

## 2018-10-30 VITALS — BP 130/60 | HR 69 | Temp 97.0°F | Resp 18 | Wt 231.2 lb

## 2018-10-30 DIAGNOSIS — C61 Malignant neoplasm of prostate: Secondary | ICD-10-CM

## 2018-10-30 DIAGNOSIS — I10 Essential (primary) hypertension: Secondary | ICD-10-CM

## 2018-10-30 DIAGNOSIS — H6122 Impacted cerumen, left ear: Secondary | ICD-10-CM | POA: Diagnosis not present

## 2018-10-30 DIAGNOSIS — E119 Type 2 diabetes mellitus without complications: Secondary | ICD-10-CM | POA: Diagnosis not present

## 2018-10-30 DIAGNOSIS — R609 Edema, unspecified: Secondary | ICD-10-CM | POA: Diagnosis not present

## 2018-10-30 DIAGNOSIS — R600 Localized edema: Secondary | ICD-10-CM

## 2018-10-30 DIAGNOSIS — E785 Hyperlipidemia, unspecified: Secondary | ICD-10-CM

## 2018-10-30 DIAGNOSIS — I25709 Atherosclerosis of coronary artery bypass graft(s), unspecified, with unspecified angina pectoris: Secondary | ICD-10-CM

## 2018-10-30 LAB — LIPID PANEL

## 2018-10-30 NOTE — Progress Notes (Signed)
Subjective:    Patient ID: Rodney Friedman, male    DOB: 08/18/1945, 73 y.o.   MRN: [id number]  HPI Rodney Friedman is a 73 y.o. male Presents today for: Chief Complaint  Patient presents with  . medication review    Patient was last seen 05/03/18 and was told to f/u in 6 months for a med review. At the 05/03/18 visit no changes to medication at that time. Patient stated he is having some swelling in left leg for about 3 weeks now. Patient stated he was having problems with Afib and seen cardiologist and they put me on Metoprolol and zetia. Also Dr Emelda Brothers added gabapentin 100 mg  Here for follow-up, last seen in December for Medicare wellness exam.  Multiple chronic medical problems as per problem list.  Care team includes Dr. Casimiro Needle for psychiatric care, Dr. Sallyanne Kuster with cardiology (afib), Dr. Karsten Ro with urology (history of prostate cancer).   Depression, Bipolar d/o:  Depression screen [facility] 2/9 10/30/2018 05/03/2018 11/02/2017 06/18/2017 02/08/2017  Decreased Interest 0 0 0 0 0  Down, Depressed, Hopeless 0 3 0 0 0  PHQ - 2 Score 0 3 0 0 0  Altered sleeping - 0 - - -  Tired, decreased energy - 3 - - -  Change in appetite - 0 - - -  Feeling bad or failure about yourself  - 3 - - -  Trouble concentrating - 0 - - -  Moving slowly or fidgety/restless - 0 - - -  Suicidal thoughts - 0 - - -  PHQ-9 Score - 9 - - -  Difficult doing work/chores - Somewhat difficult - - -  Some recent data might be hidden   Followed by counselor Carlus Pavlov, appointment May 27. Appoint with Dr. Casimiro Needle April 3. 300 mg of Neurontin to help with insomnia.  Per last note with psychiatry, Neurontin plus Wellbutrin seem to work well.  Followed by AA for history of alcohol dependency.  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 Alvy Bimler - office visit March 2.  No evidence of disease progression, plan for repeat eval in 1 year. Lab Results  Component Value Date   WBC 17.4 (H) 07/22/2018   HGB 13.1  07/22/2018   HCT 39.8 07/22/2018   MCV 91.5 07/22/2018   PLT 269 07/22/2018   Followed by urology for prostate cancer. [place].  ? appt in February,  Last note from 12/2017 in [facility].  Reports last PSA negligible ?0.07. requests repeat PSA. <0.1 on my testing in 04/2018.  unknown next appt.   Left ear wax buildup? Not having new difficulty hearing form left ear.   Cardiac: Dr. Sallyanne Kuster - Office visit March 6. History of CAD, aortic valve stenosis, paroxysmal atrial fibrillation on long-term anticoagulation.  Therapeutic INR without bleeding when discussed at last visit with cardiology. Three-vessel bypass in 2006 EF 52% on June 2013 nuclear stress test carotid artery stenosis with ultrasound September 2017 without significant internal stenosis.  Tobacco abuse with 10 cigarettes/day used in part for anxiety. Lipid options were discussed, started on Zetia 10 mg daily with plan for repeat labs in 3 months with cardiology Warfarin for anticoagulation, Toprol for rate control. Lab Results  Component Value Date   INR 2.7 10/01/2018   INR 2.3 07/26/2018   INR 3.8 (A) 07/05/2018  dyspnea with walking long distances- need handicap placard  Hypertension: BP Readings from Last 3 Encounters:  10/30/18 130/60  07/26/18 (!) 134/58  07/22/18 (!) 132/56   Lab Results  Component  Value Date   CREATININE 0.97 05/03/2018  Has been well controlled with amlodipine 10 mg daily, Toprol 200 mg daily, valsartan 320 mg daily.  Left leg swelling: Episodic swelling in left leg- every few years, same leg as vessels harvested for bypass. Read up on swelling - possible med related. R leg not swelling. No new dyspnea or chest pain.  No recent prolonged car travel or air travel, no recent surgery.  Discussed with cardiology - recommended to continue compression stocking.  On coumadin. No hx of DVT.  Current swelling past 3-4 weeks. Improves overnight.   Diabetes: Well-controlled with A1c in December. On  metformin 500 mg qd. No new side effects.  On ARB and now both Zetia and Crestor - no new myalgias with combo.   Microalbumin: Normal in 2018. Optho, foot exam, pneumovax:  Lab Results  Component Value Date   HGBA1C 6.3 (H) 05/03/2018   HGBA1C 6.3 (H) 11/02/2017   HGBA1C 6.6 (H) 06/18/2017   Lab Results  Component Value Date   MICROALBUR 1.9 10/08/2014   [place] 91 05/03/2018   CREATININE 0.97 05/03/2018      Patient Active Problem List   Diagnosis Date Noted  . Other social stressor 07/27/2018  . Essential hypertension 07/27/2018  . Diabetes mellitus type 2 in obese ([place]) 07/27/2018  . Paroxysmal atrial fibrillation ([place]) 04/21/2016  . Diabetes mellitus ([place]) 04/21/2016  . OSA (obstructive sleep apnea) 04/21/2016  . Quality of life palliative care encounter 01/03/2016  . Bilateral carotid bruits without stenosis 01/02/2016  . Tremor, essential 09/22/2015  . Glaucoma, open angle 06/16/2015  . 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  . Pseudoaphakia 06/15/2015  . CD (contact dermatitis) 04/29/2014  . 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 Hematuria 01/05/2014  . Skin lesion 01/05/2014  . Aortic valve stenosis, nonrheumatic 01/03/2014  . Hypertensive heart disease 01/03/2014  . Mixed hyperlipidemia 01/03/2014  . Mild obesity 01/03/2014  . Tobacco abuse 01/03/2014  . Chronic atrial fibrillation 12/31/2013  . Long term (current) use of anticoagulants 12/31/2013  . Cataract, nuclear 10/06/2013  . Dermatochalasis of eyelid 10/06/2013  . 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 Coronary artery disease of native artery of native heart with stable angina pectoris ([place]) 09/03/2013  . Pre-diabetes 09/03/2013  . CLL (chronic lymphocytic leukemia) ([place]) 07/08/2013  . Prostate cancer ([place]) 07/08/2013   Past Medical History:  Diagnosis Date  . Adenomatous colon polyp   . Alcoholism ([place])   . Anxiety   . Atrial fibrillation ([place])   . CAD (coronary artery disease)    . Cataract    removed left eye   . CLL (chronic lymphocytic leukemia) ([place]) 07/08/2013  . Depression   . Diabetes mellitus without complication ([place])   . Diverticulosis   . Eye abnormality    Macular scarring R eye  . Glaucoma   . Heart murmur   . HLD (hyperlipidemia)   . Hypertension   . Leukemia ([place])    CLL  . Myocardial infarction ([place]) 2006  . Prostate cancer ([place]) 07/08/2013   [place] at [facility]- getting Lupron shot every 6 months - traces in prostate and 2 lymphnodes left hip- non focused traces per pt   . Sleep apnea   . Substance abuse ([place])   . 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 Arm Surgery Right    from door accident with glass  . CATARACT EXTRACTION W/ INTRAOCULAR LENS IMPLANT Left   . COLONOSCOPY    .  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 POLYPECTOMY     Allergies  Allergen Reactions  . Other Rash    Allergen: "Plants and bushes while doing yard work"   Prior to Admission medications   Medication Sig Start Date End Date Taking? Authorizing Provider  amLODipine (NORVASC) 10 MG tablet Take 1 tablet (10 mg total) by mouth daily. 07/26/18  Yes Croitoru, Mihai, MD  B Complex Vitamins (VITAMIN-B COMPLEX PO) Take by mouth.   Yes [provider]  BIOTIN PO Take by mouth.   Yes [provider]  buPROPion (WELLBUTRIN XL) 300 MG 24 hr tablet 1  qam 08/23/18  Yes Plovsky, Berneta Sages, MD  Calcium Carbonate-Vit D-Min (CALCIUM 1200 PO) Take 1,200 mg by mouth daily.   Yes [provider]  Docusate Calcium (STOOL SOFTENER PO) Take 1 tablet as needed by mouth.   Yes [provider]  gabapentin (NEURONTIN) 100 MG capsule Take 1 capsule (100 mg total) by mouth daily. 08/23/18  Yes Plovsky, Berneta Sages, MD  ketoconazole (NIZORAL) 2 % shampoo Apply 1 application topically 2 (two) times a week. 06/18/17  Yes Wendie Agreste, MD  latanoprost (XALATAN) 0.005 % ophthalmic solution Once a day every second  day 06/30/13  Yes [provider]  Leuprolide Acetate, 6 Month, (LUPRON) 45 MG injection Inject 45 mg into the muscle every 6 (six) months.   Yes [provider]  LYCOPENE PO Take 8 mg by mouth daily.   Yes [provider]  metFORMIN (GLUCOPHAGE) 500 MG tablet Take 1 tablet (500 mg total) by mouth daily with breakfast. 07/22/18  Yes Alvy Bimler, Ni, MD  metoprolol (TOPROL-XL) 200 MG 24 hr tablet Take 0.5 tablets (100 mg total) by mouth 2 (two) times daily. Take with or immediately following a meal. 07/26/18  Yes Croitoru, Mihai, MD  Omega-3 Fatty Acids (FISH OIL) 1000 MG CAPS Take 1,000 mg by mouth daily.   Yes [provider]  rosuvastatin (CRESTOR) 20 MG tablet Take 1 tablet (20 mg total) by mouth daily. 07/26/18  Yes Croitoru, Mihai, MD  valsartan (DIOVAN) 320 MG tablet Take 1 tablet (320 mg total) by mouth daily. 07/26/18  Yes Croitoru, Mihai, MD  vitamin B-12 (CYANOCOBALAMIN) 500 MCG tablet Take 500 mcg by mouth daily.   Yes [provider]  vitamin C (ASCORBIC ACID) 500 MG tablet Take 500 mg by mouth daily.   Yes [provider]  warfarin (COUMADIN) 4 MG tablet TAKE 1/2 TO 1 TABLET DAILY AS DIRECTED BY COUMADIN CLINIC 07/26/18  Yes Croitoru, Mihai, MD  ezetimibe (ZETIA) 10 MG tablet Take 1 tablet (10 mg total) by mouth daily. 07/26/18 10/24/18  Croitoru, Dani Gobble, MD   Social History   Socioeconomic History  . Marital status: Divorced    Spouse name: Not on file  . Number of children: 2  . Years of education: 17  . Highest education level: Not on file  Occupational History  . Occupation: Retired Tour manager  . Financial resource strain: Not on file  . Food insecurity:    Worry: Not on file    Inability: Not on file  . Transportation needs:    Medical: Not on file    Non-medical: Not on file  Tobacco Use  . Smoking status: Current Every Day Smoker    Packs/day: 0.50    Years: 50.00    Pack years: 25.00    Types: Cigarettes  .  Smokeless tobacco: Never Used  Substance and Sexual Activity  .  Alcohol use: No    Alcohol/week: 0.0 standard drinks  . Drug use: No  . Sexual activity: Not Currently  Lifestyle  . Physical activity:    Days per week: Not on file    Minutes per session: Not on file  . Stress: Not on file  Relationships  . Social connections:    Talks on phone: Not on file    Gets together: Not on file    Attends religious service: Not on file    Active member of club or organization: Not on file    Attends meetings of clubs or organizations: Not on file    Relationship status: Not on file  . Intimate partner violence:    Fear of current or ex partner: Not on file    Emotionally abused: Not on file    Physically abused: Not on file    Forced sexual activity: Not on file  Other Topics Concern  . Not on file  Social History Narrative   Lives at home w/ his father   Divorced   Right-handed   Education: College   No caffeine    Review of Systems  Constitutional: Negative for fatigue and unexpected weight change.  Eyes: Negative for visual disturbance.  Respiratory: Negative for cough, chest tightness and shortness of breath (no new dyspnea, some in past without changes. ).   Cardiovascular: Negative for chest pain, palpitations and leg swelling.  Gastrointestinal: Negative for abdominal pain and blood in stool.  Neurological: Negative for dizziness, light-headedness and headaches.       Objective:   Physical Exam Vitals signs reviewed.  Constitutional:      Appearance: He is well-developed.  HENT:     Head: Normocephalic and atraumatic.     Left Ear: There is impacted cerumen (Excess cerumen approximately 70 to 80% of canal, not completely occluded).  Eyes:     Pupils: Pupils are equal, round, and reactive to light.  Neck:     Vascular: No carotid bruit or JVD.  Cardiovascular:     Rate and Rhythm: Normal rate and regular rhythm.     Heart sounds: Normal heart sounds. No murmur.   Pulmonary:     Effort: Pulmonary effort is normal.     Breath sounds: Normal breath sounds. No rales.  Musculoskeletal:     Right lower leg: Edema (1+ pitting edema bilaterally at mid tibia, some slight tighter skin on left versus right with some hyperpigmentation/stasis changes, no wounds, slight dry skin.  Calves are nontender, negative Homans, circumference 40 cm at 15 cm below patella bilaterally) present.     Left lower leg: Edema present.  Skin:    General: Skin is warm and dry.  Neurological:     Mental Status: He is alert and oriented to person, place, and time.    Vitals:   10/30/18 0947  BP: 130/60  Pulse: 69  Resp: 18  Temp: (!) 97 F (36.1 C)  TempSrc: Oral  SpO2: 97%  Weight: 231 lb 3.2 oz (104.9 kg)        Assessment & Plan:   Rodney Friedman is a 73 y.o. male Essential hypertension  -Stable.  Continue same regimen.  Type 2 diabetes mellitus without complication, without long-term current use of insulin (HCC) - Plan: Hemoglobin A1c, Microalbumin / creatinine urine ratio  -Well-controlled previously, tolerating current regimen, labs pending.  Hyperlipidemia, unspecified hyperlipidemia type - Plan: Comprehensive metabolic panel, Lipid panel  -Tolerating Zetia, Crestor, check labs, follow-up with cardiology as planned  to determine changes if needed.  Prostate cancer [facility]) - Plan: PSA  -Agreed to check PSA, then routine follow-up with urologist to determine if any changes.  Peripheral edema  -Likely dependent edema/peripheral edema from prior coronary artery bypass grafting graft site.  Calf is nontender, negative Homans.  Continue compression stocking, leg elevation, follow-up with cardiology with RTC precautions  Excessive cerumen in ear canal, left  -Over-the-counter treatment options were discussed, but not completely occluded at this time.  Will defer cerumen lavage, but option of separate appointment if needed.  No orders of the defined types were  placed in this encounter.  Patient Instructions   No changes in medications at this time. 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  Watch amount of sodium in the diet, see other information below.  If swelling is not improving, can follow-up to discuss potential medication changes, or discussed with cardiology.  I did check a PSA test today, but those results will need to be discussed and follow-up with your urologist.  There was some excess cerumen in the left ear canal but not completely obstructed.  If that blocks, schedule an appointment to see [place] and we will be happy to flush your ear.  Keep follow-up me in 6 months, unless anything changes sooner.  Thanks for coming in today.  Peripheral Edema  Peripheral edema is swelling that is caused by a buildup of fluid. Peripheral edema most often affects the lower legs, ankles, and feet. It can also develop in the arms, hands, and face. The area of the body that has peripheral edema will look swollen. It may also feel heavy or warm. Your clothes may start to feel tight. Pressing on the area may make a temporary dent in your skin. You may not be able to move your arm or leg as much as usual. There are many causes of peripheral edema. It can be a complication of other diseases, such as congestive heart failure, kidney disease, or a problem with your blood circulation. It also can be a side effect of certain medicines. It often happens to women during pregnancy. Sometimes, the cause is not known. Treating the underlying condition is often the only treatment for peripheral edema. Follow these instructions at home: Pay attention to any changes in your symptoms. Take these actions to help with your discomfort:  Raise (elevate) your legs while you are sitting or lying down.  Move around often to prevent stiffness and to lessen swelling. Do not sit or stand for long periods of  time.  Wear support stockings as told by your health care provider.  Follow instructions from your health care provider about limiting salt (sodium) in your diet. Sometimes eating less salt can reduce swelling.  Take over-the-counter and prescription medicines only as told by your health care provider. Your health care provider may prescribe medicine to help your body get rid of excess water (diuretic).  Keep all follow-up visits as told by your health care provider. This is important. Contact a health care provider if:  You have a fever.  Your edema starts suddenly or is getting worse, especially if you are pregnant or have a medical condition.  You have swelling in only one leg.  You have increased swelling and pain in your legs. Get help right away if:  You develop shortness of breath, especially when you are lying down.  You have pain in your  chest or abdomen.  You feel weak.  You faint. This information is not intended to replace advice given to you by your health care provider. Make sure you discuss any questions you have with your health care provider. Document Released: 06/15/2004 Document Revised: 10/11/2015 Document Reviewed: 11/18/2014 Elsevier Interactive Patient Education  2019 [place], Adult The ears produce a substance called earwax that helps keep bacteria out of the ear and protects the skin in the ear canal. Occasionally, earwax can build up in the ear and cause discomfort or hearing loss. What increases the risk? This condition is more likely to develop in people who:  Are male.  Are elderly.  Naturally produce more earwax.  Clean their ears often with cotton swabs.  Use earplugs often.  Use in-ear headphones often.  Wear hearing aids.  Have narrow ear canals.  Have earwax that is overly thick or sticky.  Have eczema.  Are dehydrated.  Have excess hair in the ear canal. What are the signs or symptoms? Symptoms  of this condition include:  Reduced or muffled hearing.  A feeling of fullness in the ear or feeling that the ear is plugged.  Fluid coming from the ear.  Ear pain.  Ear itch.  Ringing in the ear.  Coughing.  An obvious piece of earwax that can be seen inside the ear canal. How is this diagnosed? This condition may be diagnosed based on:  Your symptoms.  Your medical history.  An ear exam. During the exam, your health care provider will look into your ear with an instrument called an otoscope. You may have tests, including a hearing test. How is this treated? This condition may be treated by:  Using ear drops to soften the earwax.  Having the earwax removed by a health care provider. The health care provider may: ? Flush the ear with water. ? Use an instrument that has a loop on the end (curette). ? Use a suction device.  Surgery to remove the wax buildup. This may be done in severe cases. Follow these instructions at home:   Take over-the-counter and prescription medicines only as told by your health care provider.  Do not put any objects, including cotton swabs, into your ear. You can clean the opening of your ear canal with a washcloth or facial tissue.  Follow instructions from your health care provider about cleaning your ears. Do not over-clean your ears.  Drink enough fluid to keep your urine clear or pale yellow. This will help to thin the earwax.  Keep all follow-up visits as told by your health care provider. If earwax builds up in your ears often or if you use hearing aids, consider seeing your health care provider for routine, preventive ear cleanings. Ask your health care provider how often you should schedule your cleanings.  If you have hearing aids, clean them according to instructions from the manufacturer and your health care provider. Contact a health care provider if:  You have ear pain.  You develop a fever.  You have blood, pus, or other  fluid coming from your ear.  You have hearing loss.  You have ringing in your ears that does not go away.  Your symptoms do not improve with treatment.  You feel like the room is spinning (vertigo). Summary  Earwax can build up in the ear and cause discomfort or hearing loss.  The most common symptoms of this condition include reduced or muffled hearing and a  feeling of fullness in the ear or feeling that the ear is plugged.  This condition may be diagnosed based on your symptoms, your medical history, and an ear exam.  This condition may be treated by using ear drops to soften the earwax or by having the earwax removed by a health care provider.  Do not put any objects, including cotton swabs, into your ear. You can clean the opening of your ear canal with a washcloth or facial tissue. This information is not intended to replace advice given to you by your health care provider. Make sure you discuss any questions you have with your health care provider. Document Released: 06/15/2004 Document Revised: 04/19/2017 Document Reviewed: 07/19/2016 Elsevier Interactive Patient Education  [employer].   If you have lab work done today you will be contacted with your lab results within the next 2 weeks.  If you have not heard from [place] then please contact us. The fastest way to get your results is to register for My Chart.   IF you received an x-ray today, you will receive an invoice from [facility]. Please contact [facility] at [phone] with questions or concerns regarding your invoice.   IF you received labwork today, you will receive an invoice from [facility]. Please contact [facility] at [phone] with questions or concerns regarding your invoice.   Our billing staff will not be able to assist you with questions regarding bills from these companies.  You will be contacted with the lab results as soon as they are available. The fastest way to get your results  is to activate your My Chart account. Instructions are located on the last page of this paperwork. If you have not heard from [place] regarding the results in 2 weeks, please contact this office.       Signed,   [name], MD [facility].  10/31/18 1:57 PM

## 2018-10-30 NOTE — Patient Instructions (Addendum)
No changes in medications at this time. 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  Watch amount of sodium in the diet, see other information below.  If swelling is not improving, can follow-up to discuss potential medication changes, or discussed with cardiology.  I did check a PSA test today, but those results will need to be discussed and follow-up with your urologist.  There was some excess cerumen in the left ear canal but not completely obstructed.  If that blocks, schedule an appointment to see [place] and we will be happy to flush your ear.  Keep follow-up me in 6 months, unless anything changes sooner.  Thanks for coming in today.  Peripheral Edema  Peripheral edema is swelling that is caused by a buildup of fluid. Peripheral edema most often affects the lower legs, ankles, and feet. It can also develop in the arms, hands, and face. The area of the body that has peripheral edema will look swollen. It may also feel heavy or warm. Your clothes may start to feel tight. Pressing on the area may make a temporary dent in your skin. You may not be able to move your arm or leg as much as usual. There are many causes of peripheral edema. It can be a complication of other diseases, such as congestive heart failure, kidney disease, or a problem with your blood circulation. It also can be a side effect of certain medicines. It often happens to women during pregnancy. Sometimes, the cause is not known. Treating the underlying condition is often the only treatment for peripheral edema. Follow these instructions at home: Pay attention to any changes in your symptoms. Take these actions to help with your discomfort:  Raise (elevate) your legs while you are sitting or lying down.  Move around often to prevent stiffness and to lessen swelling. Do not sit or stand for long periods of time.  Wear support stockings as told by your health care  provider.  Follow instructions from your health care provider about limiting salt (sodium) in your diet. Sometimes eating less salt can reduce swelling.  Take over-the-counter and prescription medicines only as told by your health care provider. Your health care provider may prescribe medicine to help your body get rid of excess water (diuretic).  Keep all follow-up visits as told by your health care provider. This is important. Contact a health care provider if:  You have a fever.  Your edema starts suddenly or is getting worse, especially if you are pregnant or have a medical condition.  You have swelling in only one leg.  You have increased swelling and pain in your legs. Get help right away if:  You develop shortness of breath, especially when you are lying down.  You have pain in your chest or abdomen.  You feel weak.  You faint. This information is not intended to replace advice given to you by your health care provider. Make sure you discuss any questions you have with your health care provider. Document Released: 06/15/2004 Document Revised: 10/11/2015 Document Reviewed: 11/18/2014 Elsevier Interactive Patient Education  2019 [place], Adult The ears produce a substance called earwax that helps keep bacteria out of the ear and protects the skin in the ear canal. Occasionally, earwax can build up in the ear and cause discomfort or hearing loss. What increases the risk? This condition is more likely to develop in  people who:  Are male.  Are elderly.  Naturally produce more earwax.  Clean their ears often with cotton swabs.  Use earplugs often.  Use in-ear headphones often.  Wear hearing aids.  Have narrow ear canals.  Have earwax that is overly thick or sticky.  Have eczema.  Are dehydrated.  Have excess hair in the ear canal. What are the signs or symptoms? Symptoms of this condition include:  Reduced or muffled  hearing.  A feeling of fullness in the ear or feeling that the ear is plugged.  Fluid coming from the ear.  Ear pain.  Ear itch.  Ringing in the ear.  Coughing.  An obvious piece of earwax that can be seen inside the ear canal. How is this diagnosed? This condition may be diagnosed based on:  Your symptoms.  Your medical history.  An ear exam. During the exam, your health care provider will look into your ear with an instrument called an otoscope. You may have tests, including a hearing test. How is this treated? This condition may be treated by:  Using ear drops to soften the earwax.  Having the earwax removed by a health care provider. The health care provider may: ? Flush the ear with water. ? Use an instrument that has a loop on the end (curette). ? Use a suction device.  Surgery to remove the wax buildup. This may be done in severe cases. Follow these instructions at home:   Take over-the-counter and prescription medicines only as told by your health care provider.  Do not put any objects, including cotton swabs, into your ear. You can clean the opening of your ear canal with a washcloth or facial tissue.  Follow instructions from your health care provider about cleaning your ears. Do not over-clean your ears.  Drink enough fluid to keep your urine clear or pale yellow. This will help to thin the earwax.  Keep all follow-up visits as told by your health care provider. If earwax builds up in your ears often or if you use hearing aids, consider seeing your health care provider for routine, preventive ear cleanings. Ask your health care provider how often you should schedule your cleanings.  If you have hearing aids, clean them according to instructions from the manufacturer and your health care provider. Contact a health care provider if:  You have ear pain.  You develop a fever.  You have blood, pus, or other fluid coming from your ear.  You have hearing  loss.  You have ringing in your ears that does not go away.  Your symptoms do not improve with treatment.  You feel like the room is spinning (vertigo). Summary  Earwax can build up in the ear and cause discomfort or hearing loss.  The most common symptoms of this condition include reduced or muffled hearing and a feeling of fullness in the ear or feeling that the ear is plugged.  This condition may be diagnosed based on your symptoms, your medical history, and an ear exam.  This condition may be treated by using ear drops to soften the earwax or by having the earwax removed by a health care provider.  Do not put any objects, including cotton swabs, into your ear. You can clean the opening of your ear canal with a washcloth or facial tissue. This information is not intended to replace advice given to you by your health care provider. Make sure you discuss any questions you have with your health care provider.  Document Released: 06/15/2004 Document Revised: 04/19/2017 Document Reviewed: 07/19/2016 Elsevier Interactive Patient Education  [employer].   If you have lab work done today you will be contacted with your lab results within the next 2 weeks.  If you have not heard from [place] then please contact us. The fastest way to get your results is to register for My Chart.   IF you received an x-ray today, you will receive an invoice from [facility]. Please contact [facility] at [phone] with questions or concerns regarding your invoice.   IF you received labwork today, you will receive an invoice from [facility]. Please contact [facility] at [phone] with questions or concerns regarding your invoice.   Our billing staff will not be able to assist you with questions regarding bills from these companies.  You will be contacted with the lab results as soon as they are available. The fastest way to get your results is to activate your My Chart account.  Instructions are located on the last page of this paperwork. If you have not heard from [place] regarding the results in 2 weeks, please contact this office.

## 2018-10-31 ENCOUNTER — Ambulatory Visit (INDEPENDENT_AMBULATORY_CARE_PROVIDER_SITE_OTHER): Payer: Medicare Other | Admitting: Licensed Clinical Social Worker

## 2018-10-31 ENCOUNTER — Encounter (HOSPITAL_COMMUNITY): Payer: Self-pay | Admitting: Licensed Clinical Social Worker

## 2018-10-31 ENCOUNTER — Encounter: Payer: Self-pay | Admitting: Family Medicine

## 2018-10-31 ENCOUNTER — Other Ambulatory Visit: Payer: Self-pay | Admitting: Family Medicine

## 2018-10-31 DIAGNOSIS — F330 Major depressive disorder, recurrent, mild: Secondary | ICD-10-CM

## 2018-10-31 DIAGNOSIS — F411 Generalized anxiety disorder: Secondary | ICD-10-CM

## 2018-10-31 DIAGNOSIS — E119 Type 2 diabetes mellitus without complications: Secondary | ICD-10-CM

## 2018-10-31 LAB — LIPID PANEL
Chol/HDL Ratio: 7 ratio — ABNORMAL HIGH (ref 0.0–5.0)
Cholesterol, Total: 218 mg/dL — ABNORMAL HIGH (ref 100–199)
HDL: 31 mg/dL — ABNORMAL LOW (ref >39)
LDL Calculated: 111 mg/dL — ABNORMAL HIGH (ref 0–99)
Triglycerides: 379 mg/dL — ABNORMAL HIGH (ref 0–149)
VLDL Cholesterol Cal: 76 mg/dL — ABNORMAL HIGH (ref 5–40)

## 2018-10-31 LAB — COMPREHENSIVE METABOLIC PANEL
ALT: 40 IU/L (ref 0–44)
AST: 29 IU/L (ref 0–40)
Albumin/Globulin Ratio: 1.9 (ref 1.2–2.2)
Albumin: 4.2 g/dL (ref 3.8–4.8)
Alkaline Phosphatase: 54 IU/L (ref 39–117)
BUN/Creatinine Ratio: 17 (ref 10–24)
BUN: 17 mg/dL (ref 8–27)
Bilirubin Total: <0.2 mg/dL (ref 0.0–1.2)
CO2: 22 mmol/L (ref 20–29)
Calcium: 9.4 mg/dL (ref 8.6–10.2)
Chloride: 106 mmol/L (ref 96–106)
Creatinine, Ser: 1.02 mg/dL (ref 0.76–1.27)
GFR calc Af Amer: 86 mL/min/{1.73_m2} (ref >59)
GFR calc non Af Amer: 74 mL/min/{1.73_m2} (ref >59)
Globulin, Total: 2.2 g/dL (ref 1.5–4.5)
Glucose: 162 mg/dL — ABNORMAL HIGH (ref 65–99)
Potassium: 4.5 mmol/L (ref 3.5–5.2)
Sodium: 144 mmol/L (ref 134–144)
Total Protein: 6.4 g/dL (ref 6.0–8.5)

## 2018-10-31 LAB — HEMOGLOBIN A1C
Est. average glucose Bld gHb Est-mCnc: 163 mg/dL
Hgb A1c MFr Bld: 7.3 % — ABNORMAL HIGH (ref 4.8–5.6)

## 2018-10-31 LAB — PSA: Prostate Specific Ag, Serum: <0.1 ng/mL (ref 0.0–4.0)

## 2018-10-31 NOTE — Progress Notes (Signed)
Virtual Visit via Telephone Note  I connected with Rodney Friedman on 10/31/18 at  9:00 AM EDT by telephone and verified that I am speaking with the correct person using two identifiers.     I discussed the limitations, risks, security and privacy concerns of performing an evaluation and management service by telephone and the availability of in person appointments. I also discussed with the patient that there may be a patient responsible charge related to this service. The patient expressed understanding and agreed to proceed.  Type of Therapy: Individual Therapy   Treatment Goals addressed: Improve psychiatric symptoms, elevate mood (decreased irritability, increased enjoyment of activities), Improve unhelpful thought patterns, emotional regulation skills (reduce temper outburst), Interpersonal relationship skills   Interventions: Motivational Interviewing and Other: Grounding and Mindfulness techniques   Summary: Rodney Friedman is a 73 y.o. male who presents with Major Depressive Disorder, moderate and Generalized Anxiety Disorder   Suicidal/Homicidal: No without intent/plan   Therapist Response:  Rodney Friedman met with clinician for individual therapy. Rodney Friedman discussed his psychiatric symptoms and current life events. Rodney Friedman shared that he has been plugging along, getting daily issues taken care of for himself and his father. He reported having a small breakdown the other day thinking about what life will be like when his father dies. Clinician reflected the inevitability of that day and discussed the importance of being present and enjoying the little things daily. Clinician also identified the importance of socialization for Rodney Friedman, as he has been very isolated the past several months. Rodney Friedman reports a few of his AA buddies are coming over for a meeting today at his house. He reflected that his friends must be concerned about him, but also reported that he was looking forward to deepening some of those  relationships.   Plan: Return again in 1-2 weeks.   Diagnosis: Axis I: Major depressive disorder, moderate and Generalized Anxiety Disorder    I discussed the assessment and treatment plan with the patient. The patient was provided an opportunity to ask questions and all were answered. The patient agreed with the plan and demonstrated an understanding of the instructions.   The patient was advised to call back or seek an in-person evaluation if the symptoms worsen or if the condition fails to improve as anticipated.  I provided 45 minutes of non-face-to-face time during this encounter.   [name], LCSW

## 2018-11-01 ENCOUNTER — Other Ambulatory Visit: Payer: Self-pay

## 2018-11-01 ENCOUNTER — Ambulatory Visit (INDEPENDENT_AMBULATORY_CARE_PROVIDER_SITE_OTHER): Payer: Medicare Other | Admitting: Family Medicine

## 2018-11-01 DIAGNOSIS — E119 Type 2 diabetes mellitus without complications: Secondary | ICD-10-CM

## 2018-11-01 NOTE — Addendum Note (Signed)
Addended by: Ileana Roup on: 11/01/2018 10:54 AM   Modules accepted: Orders

## 2018-11-01 NOTE — Addendum Note (Signed)
Addended by: Ileana Roup on: 11/01/2018 10:56 AM   Modules accepted: Orders

## 2018-11-02 LAB — MICROALBUMIN / CREATININE URINE RATIO
Creatinine, Urine: 112 mg/dL
Microalb/Creat Ratio: 4 mg/g creat (ref 0–29)
Microalbumin, Urine: 4.3 ug/mL

## 2018-11-06 ENCOUNTER — Telehealth: Payer: Self-pay

## 2018-11-06 DIAGNOSIS — H2512 Age-related nuclear cataract, left eye: Secondary | ICD-10-CM | POA: Diagnosis not present

## 2018-11-06 DIAGNOSIS — H401114 Primary open-angle glaucoma, right eye, indeterminate stage: Secondary | ICD-10-CM | POA: Diagnosis not present

## 2018-11-06 DIAGNOSIS — Z961 Presence of intraocular lens: Secondary | ICD-10-CM | POA: Diagnosis not present

## 2018-11-06 DIAGNOSIS — H401122 Primary open-angle glaucoma, left eye, moderate stage: Secondary | ICD-10-CM | POA: Diagnosis not present

## 2018-11-06 NOTE — Telephone Encounter (Signed)

## 2018-11-06 NOTE — Telephone Encounter (Signed)
lmom for prescreen  

## 2018-11-13 ENCOUNTER — Ambulatory Visit (INDEPENDENT_AMBULATORY_CARE_PROVIDER_SITE_OTHER): Payer: Medicare Other | Admitting: Pharmacist Clinician (PhC)/ Clinical Pharmacy Specialist

## 2018-11-13 ENCOUNTER — Other Ambulatory Visit: Payer: Self-pay

## 2018-11-13 DIAGNOSIS — Z7901 Long term (current) use of anticoagulants: Secondary | ICD-10-CM

## 2018-11-13 DIAGNOSIS — I480 Paroxysmal atrial fibrillation: Secondary | ICD-10-CM

## 2018-11-13 DIAGNOSIS — I482 Chronic atrial fibrillation, unspecified: Secondary | ICD-10-CM

## 2018-11-13 LAB — POCT INR: INR: 1.5 — AB (ref 2.0–3.0)

## 2018-11-13 NOTE — Patient Instructions (Signed)
Take 1.5 tablets Wednesday June 24 and Thursday June 25.  Then continue with 1 tablet daily except 1/2 tablet each Tuesday and Friday. Repeat INR in 4 weeks

## 2018-11-18 ENCOUNTER — Ambulatory Visit (INDEPENDENT_AMBULATORY_CARE_PROVIDER_SITE_OTHER): Payer: Medicare Other | Admitting: Licensed Clinical Social Worker

## 2018-11-18 ENCOUNTER — Encounter (HOSPITAL_COMMUNITY): Payer: Self-pay | Admitting: Licensed Clinical Social Worker

## 2018-11-18 ENCOUNTER — Other Ambulatory Visit: Payer: Self-pay

## 2018-11-18 DIAGNOSIS — F411 Generalized anxiety disorder: Secondary | ICD-10-CM

## 2018-11-18 DIAGNOSIS — F331 Major depressive disorder, recurrent, moderate: Secondary | ICD-10-CM | POA: Diagnosis not present

## 2018-11-18 NOTE — Progress Notes (Signed)
Virtual Visit via Telephone Note  I connected with Rodney Friedman on 11/18/18 at  9:00 AM EDT by telephone and verified that I am speaking with the correct person using two identifiers.    I discussed the limitations, risks, security and privacy concerns of performing an evaluation and management service by telephone and the availability of in person appointments. I also discussed with the patient that there may be a patient responsible charge related to this service. The patient expressed understanding and agreed to proceed.    Type of Therapy: Individual Therapy  Treatment Goals addressed: Improve psychiatric symptoms, elevate mood (decreased irritability, increased enjoyment of activities), Improve unhelpful thought patterns, emotional regulation skills (reduce temper outburst), Interpersonal relationship skills  Interventions: Motivational Interviewing and Other: Grounding and Mindfulness techniques  Summary: Rodney Friedman is a 73 y.o. male who presents with Major Depressive Disorder, moderate and Generalized Anxiety Disorder  Suicidal/Homicidal: No without intent/plan  Therapist Response:  [name] met with clinician for individual therapy. [name] discussed his psychiatric symptoms and current life events. Joshua shared that over the past several days he has been in a pretty bad depressive state. He reports no SI and knows that it will lift in the next few days. However, he reports feeling very lonely, isolated, and hopeless, especially when things go wrong. [name] processed experience dealing with [facility] for his dad, which was inhibited by [name]'s inability to receive the phone call. [name] also processed frustration with his son, who mows his lawn, not following directions. 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 Clinician discussed ways to manage his own anxiety and emotions through deep breathing and thought stopping.   Plan: Return again in 1-2  weeks.  Diagnosis: Axis I: Major depressive disorder, moderate and Generalized Anxiety Disorder  I discussed the assessment and treatment plan with the patient. The patient was provided an opportunity to ask questions and all were answered. The patient agreed with the plan and demonstrated an understanding of the instructions.   The patient was advised to call back or seek an in-person evaluation if the symptoms worsen or if the condition fails to improve as anticipated.  I provided 55 minutes of non-face-to-face time during this encounter.   [name], LCSW

## 2018-11-21 ENCOUNTER — Other Ambulatory Visit (HOSPITAL_COMMUNITY): Payer: Self-pay

## 2018-11-27 ENCOUNTER — Other Ambulatory Visit: Payer: Self-pay

## 2018-11-27 ENCOUNTER — Encounter (HOSPITAL_COMMUNITY): Payer: Self-pay | Admitting: Licensed Clinical Social Worker

## 2018-11-27 ENCOUNTER — Ambulatory Visit (INDEPENDENT_AMBULATORY_CARE_PROVIDER_SITE_OTHER): Payer: Medicare Other | Admitting: Licensed Clinical Social Worker

## 2018-11-27 DIAGNOSIS — F331 Major depressive disorder, recurrent, moderate: Secondary | ICD-10-CM

## 2018-11-27 DIAGNOSIS — F411 Generalized anxiety disorder: Secondary | ICD-10-CM

## 2018-11-27 NOTE — Progress Notes (Signed)
Virtual Visit via Telephone Note  I connected with Rodney Friedman on 11/27/18 at  9:00 AM EDT by telephone and verified that I am speaking with the correct person using two identifiers.    I discussed the limitations, risks, security and privacy concerns of performing an evaluation and management service by telephone and the availability of in person appointments. I also discussed with the patient that there may be a patient responsible charge related to this service. The patient expressed understanding and agreed to proceed.   Type of Therapy: Individual Therapy  Treatment Goals addressed: Improve psychiatric symptoms, elevate mood (decreased irritability, increased enjoyment of activities), Improve unhelpful thought patterns, emotional regulation skills (reduce temper outburst), Interpersonal relationship skills  Interventions: Motivational Interviewing and Other: Grounding and Mindfulness techniques  Summary: Rodney Friedman is a 73 y.o. male who presents with Major Depressive Disorder, moderate and Generalized Anxiety Disorder  Suicidal/Homicidal: No without intent/plan  Therapist Response:  [name] met with clinician for individual therapy. Breckyn discussed his psychiatric symptoms and current life events. Tevyn shared that he continues to struggle with depression and irritability over small challenges. Clinician explored the impulse to get "enraged" over small things and utilized CBT to reassure Sanchez that he could choose his emotional response. Syair identified this as a function of his depression. He also noted a lifelong sense of perfectionism, which he found documented on his report cards from 2nd grade. Clinician identified the importance of making small changes, rather than starting over from scratch.  [name] processed his experience with the [place] doctor and getting everything arranged for his father. He reports it is all set up and he felt a deep sense of relief. He then identified a list of  other challenges and tasks that need to be completed for his children and grandchildren. Clinician utilized reflections and summarizations to actively support [name] in processing his tasks.  Clinician processed current world events and assisted [name] in making sense of the [employer]. Clinician also noted the importance of using his inner lens and thinking about how his privilege has impacted him in his life.    Plan: Return again in 1-2 weeks.  Diagnosis: Axis I: Major depressive disorder, moderate and Generalized Anxiety Disorder    I discussed the assessment and treatment plan with the patient. The patient was provided an opportunity to ask questions and all were answered. The patient agreed with the plan and demonstrated an understanding of the instructions.   The patient was advised to call back or seek an in-person evaluation if the symptoms worsen or if the condition fails to improve as anticipated.  I provided 55 minutes of non-face-to-face time during this encounter.   Rodney Curling, LCSW

## 2018-12-06 ENCOUNTER — Other Ambulatory Visit: Payer: Self-pay

## 2018-12-06 ENCOUNTER — Ambulatory Visit (INDEPENDENT_AMBULATORY_CARE_PROVIDER_SITE_OTHER): Payer: Medicare Other | Admitting: Psychiatry

## 2018-12-06 DIAGNOSIS — F1020 Alcohol dependence, uncomplicated: Secondary | ICD-10-CM

## 2018-12-06 DIAGNOSIS — F17210 Nicotine dependence, cigarettes, uncomplicated: Secondary | ICD-10-CM

## 2018-12-06 DIAGNOSIS — F325 Major depressive disorder, single episode, in full remission: Secondary | ICD-10-CM | POA: Diagnosis not present

## 2018-12-06 DIAGNOSIS — G4700 Insomnia, unspecified: Secondary | ICD-10-CM | POA: Diagnosis not present

## 2018-12-06 DIAGNOSIS — Z7901 Long term (current) use of anticoagulants: Secondary | ICD-10-CM

## 2018-12-06 MED ORDER — GABAPENTIN 100 MG PO CAPS: 100.0000 mg | ORAL_CAPSULE | Freq: Every day | ORAL | 4 refills | Status: DC

## 2018-12-06 MED ORDER — BUPROPION HCL ER (XL) 300 MG PO TB24: ORAL_TABLET | ORAL | 2 refills | Status: DC

## 2018-12-06 NOTE — Progress Notes (Signed)
Psychiatric Initial Adult Assessment   Patient Identification: Rodney Friedman MRN:  [id number] Date of Evaluation:  12/06/2018 Referral Source: Dr. Nyoka Cowden Chief Complaint: Need follow-up care   Visit Diagnosis: Bipolar disorder, substance use disorder Bipolar disorder  History of Present Illness: At this time the patient is actually doing very well.  His mood is stable.  He has a number of medical illnesses that is being followed closely for.  His PSA is low his atrial fibrillation seems to be under control.  The patient sees a therapist here Janett Billow every 2 weeks and gets benefit.  The good news for the patient as he just found out that his father potentially could get into a [place] facility if he needs long-term care.  This will take a lot of pressure off this patient.  The patient is issue with the IRS and money that he knows seems to be put on hold.  The patient does not seem to be bothered too much by this.  The patient is sleeping and eating well.  He is got good energy.  He drinks no alcohol uses no drugs.  He has no evidence of psychosis.  Is a good attitude about the viral pandemic and takes care of himself.  He is positive and optimistic.  His thoughts are clear and organized.  He denies chest pain shortness of breath or any respiratory symptoms.  Substance Abuse History in the last 12 months:  Yes.    Consequences of Substance Abuse: NA  Past Medical History:  Past Medical History:  Diagnosis Date  . Adenomatous colon polyp   . Alcoholism ([place])   . Anxiety   . Atrial fibrillation ([place])   . CAD (coronary artery disease)   . Cataract    removed left eye   . 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 Depression   . Diabetes mellitus without complication ([place])   . Diverticulosis   . Eye abnormality    Macular scarring R eye  . Glaucoma   . Heart murmur   . HLD (hyperlipidemia)   . Hypertension   . Leukemia ([place])    CLL  . Myocardial infarction ([place]) 2006  .  Prostate cancer ([place]) 07/08/2013   [facility]- getting Lupron shot every 6 months - traces in prostate and 2 lymphnodes left hip- non focused traces per pt   . Sleep apnea   . Substance abuse ([place])   . 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 Arm Surgery Right    from door accident with glass  . CATARACT EXTRACTION W/ INTRAOCULAR LENS IMPLANT Left   . COLONOSCOPY    . 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 POLYPECTOMY      Family Psychiatric History:   Family History:  Family History  Problem Relation Age of Onset  . Colon cancer Mother   . Ovarian cancer Mother   . Uterine cancer Mother   . Hypertension Father   . Prostate cancer Father   . Colon polyps Neg Hx     Social History:   Social History   Socioeconomic History  . Marital status: Divorced    Spouse name: Not on file  . Number of children: 2  . Years of education: 84  . Highest education level: Not on file  Occupational History  . Occupation: Retired Tour manager  . Financial resource strain: Not on file  . Food insecurity  Worry: Not on file    Inability: Not on file  . Transportation needs    Medical: Not on file    Non-medical: Not on file  Tobacco Use  . Smoking status: Current Every Day Smoker    Packs/day: 0.50    Years: 50.00    Pack years: 25.00    Types: Cigarettes  . Smokeless tobacco: Never Used  Substance and Sexual Activity  . Alcohol use: No    Alcohol/week: 0.0 standard drinks  . Drug use: No  . Sexual activity: Not Currently  Lifestyle  . Physical activity    Days per week: Not on file    Minutes per session: Not on file  . Stress: Not on file  Relationships  . Social Herbalist on phone: Not on file    Gets together: Not on file    Attends religious service: Not on file    Active member of club or organization: Not on file    Attends meetings of clubs or organizations: Not on file     Relationship status: Not on file  Other Topics Concern  . Not on file  Social History Narrative   Lives at home w/ his father   Divorced   Right-handed   Education: College   No caffeine    Additional Social History:   Allergies:   Allergies  Allergen Reactions  . Other Rash    Allergen: "Plants and bushes while doing yard work"    Metabolic Disorder Labs: Lab Results  Component Value Date   HGBA1C 7.3 (H) 10/30/2018   MPG 117 02/11/2016   MPG 128 (H) 03/22/2015   No results found for: PROLACTIN Lab Results  Component Value Date   CHOL 218 (H) 10/30/2018   TRIG 379 (H) 10/30/2018   HDL 31 (L) 10/30/2018   CHOLHDL 7.0 (H) 10/30/2018   VLDL 41 (H) 03/10/2016   LDLCALC 111 (H) 10/30/2018   LDLCALC 91 05/03/2018     Current Medications: Current Outpatient Medications  Medication Sig Dispense Refill  . amLODipine (NORVASC) 10 MG tablet Take 1 tablet (10 mg total) by mouth daily. 90 tablet 3  . B Complex Vitamins (VITAMIN-B COMPLEX PO) Take by mouth.    Marland Kitchen BIOTIN PO Take by mouth.    Marland Kitchen buPROPion (WELLBUTRIN XL) 300 MG 24 hr tablet 1  qam 90 tablet 2  . Calcium Carbonate-Vit D-Min (CALCIUM 1200 PO) Take 1,200 mg by mouth daily.    Mariane Baumgarten Calcium (STOOL SOFTENER PO) Take 1 tablet as needed by mouth.    . ezetimibe (ZETIA) 10 MG tablet Take 1 tablet (10 mg total) by mouth daily. 90 tablet 3  . gabapentin (NEURONTIN) 100 MG capsule Take 1 capsule (100 mg total) by mouth daily. 90 capsule 3  . ketoconazole (NIZORAL) 2 % shampoo Apply 1 application topically 2 (two) times a week. 120 mL 1  . latanoprost (XALATAN) 0.005 % ophthalmic solution Once a day every second day    . Leuprolide Acetate, 6 Month, (LUPRON) 45 MG injection Inject 45 mg into the muscle every 6 (six) months.    Marland Kitchen LYCOPENE PO Take 8 mg by mouth daily.    . metFORMIN (GLUCOPHAGE) 500 MG tablet TAKE 1 TABLET BY MOUTH EVERY EVENING 90 tablet 1  . metoprolol (TOPROL-XL) 200 MG 24 hr tablet Take 0.5 tablets  (100 mg total) by mouth 2 (two) times daily. Take with or immediately following a meal. 90 tablet 3  . Omega-3  Fatty Acids (FISH OIL) 1000 MG CAPS Take 1,000 mg by mouth daily.    . rosuvastatin (CRESTOR) 20 MG tablet Take 1 tablet (20 mg total) by mouth daily. 90 tablet 3  . valsartan (DIOVAN) 320 MG tablet Take 1 tablet (320 mg total) by mouth daily. 90 tablet 3  . vitamin B-12 (CYANOCOBALAMIN) 500 MCG tablet Take 500 mcg by mouth daily.    . vitamin C (ASCORBIC ACID) 500 MG tablet Take 500 mg by mouth daily.    Marland Kitchen warfarin (COUMADIN) 4 MG tablet TAKE 1/2 TO 1 TABLET DAILY AS DIRECTED BY COUMADIN CLINIC 90 tablet 1   Current Facility-Administered Medications  Medication Dose Route Frequency Provider Last Rate Last Dose  . 0.9 %  sodium chloride infusion  500 mL Intravenous Once Doran Stabler, MD        Neurologic: Headache: No Seizure: No Paresthesias:No  Musculoskeletal: Strength & Muscle Tone: within normal limits Gait & Station: normal Patient leans: Right  Psychiatric Specialty Exam: ROS  There were no vitals taken for this visit.There is no height or weight on file to calculate BMI.  General Appearance: Casual  Eye Contact:  Good  Speech:  Clear and Coherent  Volume:  Normal  Mood:  NA  Affect:  Appropriate  Thought Process:  Goal Directed  Orientation:  NA  Thought Content:  WDL  Suicidal Thoughts:  No  Homicidal Thoughts:  No  Memory:  Negative  Judgement:  Good  Insight:  Good  Psychomotor Activity:  Normal  Concentration:    Recall:  [place] of Knowledge:Good  Language: Good  Akathisia:  No  Handed:  Right  AIMS (if indicated):    Assets:  Desire for Improvement  ADL's:  Intact  Cognition: WNL  Sleep:      Treatment Plan Summary: 7/17/20209:32 AM  At this time the patient is doing well.  He is functioning well.  His first problem seems to be that of clinical depression and he takes Wellbutrin 300 mg.  There is some issue with the cost of the  Wellbutrin.  He says he pays over $140 for a month supply and he will look into that.  His second problem is insomnia which he takes Neurontin 400 mg.  This works very well.  The patient's third problem is alcohol dependency he goes to AA on a regular basis.  Patient continues in psychotherapy.  The patient is very stable at this time.  He is not fatigued.  The patient will be reevaluated in 4 months.

## 2018-12-09 ENCOUNTER — Telehealth: Payer: Self-pay

## 2018-12-09 NOTE — Telephone Encounter (Signed)

## 2018-12-11 ENCOUNTER — Ambulatory Visit (INDEPENDENT_AMBULATORY_CARE_PROVIDER_SITE_OTHER): Payer: Medicare Other | Admitting: Licensed Clinical Social Worker

## 2018-12-11 ENCOUNTER — Ambulatory Visit (INDEPENDENT_AMBULATORY_CARE_PROVIDER_SITE_OTHER): Payer: Medicare Other | Admitting: Pharmacist Clinician (PhC)/ Clinical Pharmacy Specialist

## 2018-12-11 ENCOUNTER — Other Ambulatory Visit: Payer: Self-pay

## 2018-12-11 ENCOUNTER — Encounter (HOSPITAL_COMMUNITY): Payer: Self-pay | Admitting: Licensed Clinical Social Worker

## 2018-12-11 DIAGNOSIS — Z7901 Long term (current) use of anticoagulants: Secondary | ICD-10-CM

## 2018-12-11 DIAGNOSIS — I482 Chronic atrial fibrillation, unspecified: Secondary | ICD-10-CM | POA: Diagnosis not present

## 2018-12-11 DIAGNOSIS — F411 Generalized anxiety disorder: Secondary | ICD-10-CM | POA: Diagnosis not present

## 2018-12-11 DIAGNOSIS — F330 Major depressive disorder, recurrent, mild: Secondary | ICD-10-CM

## 2018-12-11 DIAGNOSIS — I480 Paroxysmal atrial fibrillation: Secondary | ICD-10-CM | POA: Diagnosis not present

## 2018-12-11 LAB — POCT INR: INR: 2.2 (ref 2.0–3.0)

## 2018-12-11 NOTE — Progress Notes (Signed)
Virtual Visit via Telephone Note  I connected with Rodney Friedman on 12/11/18 at  9:00 AM EDT by telephone and verified that I am speaking with the correct person using two identifiers.    I discussed the limitations, risks, security and privacy concerns of performing an evaluation and management service by telephone and the availability of in person appointments. I also discussed with the patient that there may be a patient responsible charge related to this service. The patient expressed understanding and agreed to proceed.  Type of Therapy: Individual Therapy  Treatment Goals addressed: Improve psychiatric symptoms, elevate mood (decreased irritability, increased enjoyment of activities), Improve unhelpful thought patterns, emotional regulation skills (reduce temper outburst), Interpersonal relationship skills  Interventions: Motivational Interviewing and Other: Grounding and Mindfulness techniques  Summary: [name] is a 73 y.o. male who presents with Major Depressive Disorder, mild and Generalized Anxiety Disorder  Suicidal/Homicidal: No without intent/plan  Therapist Response:  [name] met with clinician for individual therapy. [name] discussed his psychiatric symptoms and current life events. [name] shared that he continues to complete his to-do list, getting everyone situated and making sure all his paperwork and chores are completed. He reports he feels pretty accomplished, as he has checked off some pretty significant tasks. However, he reports he continues to wait for the IRS to make a decision about his payments. He reports that he has reduced some of his news watching and has transitioned to some police dramas. Clinician explored the impact of changing his viewing habits and noted a little more comfort and happiness. [name] also reports that he has been participating in 2 online [place] meetings and 1 in person meeting at his home. Clinician processed the changes in [place] as he has returned  to some socializing. Clinician noted that [name] has everyone in a separate room from his father with the window open and everyone is wearing a mask. He agreed that this has been helpful in his life, as he was battling some loneliness and depression.   Plan: Return again in 2 weeks.  Diagnosis: Axis I: Major depressive disorder, mild and Generalized Anxiety Disorder    I discussed the assessment and treatment plan with the patient. The patient was provided an opportunity to ask questions and all were answered. The patient agreed with the plan and demonstrated an understanding of the instructions.   The patient was advised to call back or seek an in-person evaluation if the symptoms worsen or if the condition fails to improve as anticipated.  I provided 45 minutes of non-face-to-face time during this encounter.   [name], LCSW

## 2018-12-23 DIAGNOSIS — C61 Malignant neoplasm of prostate: Secondary | ICD-10-CM | POA: Diagnosis not present

## 2018-12-25 ENCOUNTER — Encounter (HOSPITAL_COMMUNITY): Payer: Self-pay | Admitting: Licensed Clinical Social Worker

## 2018-12-25 ENCOUNTER — Other Ambulatory Visit: Payer: Self-pay

## 2018-12-25 ENCOUNTER — Ambulatory Visit (INDEPENDENT_AMBULATORY_CARE_PROVIDER_SITE_OTHER): Payer: Medicare Other | Admitting: Licensed Clinical Social Worker

## 2018-12-25 DIAGNOSIS — F331 Major depressive disorder, recurrent, moderate: Secondary | ICD-10-CM | POA: Diagnosis not present

## 2018-12-25 DIAGNOSIS — F411 Generalized anxiety disorder: Secondary | ICD-10-CM | POA: Diagnosis not present

## 2018-12-25 NOTE — Progress Notes (Signed)
Virtual Visit via Telephone Note  I connected with Nora Rooke on 12/25/18 at  9:00 AM EDT by telephone and verified that I am speaking with the correct person using two identifiers.    I discussed the limitations, risks, security and privacy concerns of performing an evaluation and management service by telephone and the availability of in person appointments. I also discussed with the patient that there may be a patient responsible charge related to this service. The patient expressed understanding and agreed to proceed.  Type of Therapy: Individual Therapy  Treatment Goals addressed: Improve psychiatric symptoms, elevate mood (decreased irritability, increased enjoyment of activities), Improve unhelpful thought patterns, emotional regulation skills (reduce temper outburst), Interpersonal relationship skills  Interventions: Motivational Interviewing and Other: Grounding and Mindfulness techniques  Summary: Thom Ollinger is a 73 y.o. male who presents with Major Depressive Disorder, moderate and Generalized Anxiety Disorder  Suicidal/Homicidal: No without intent/plan  Therapist Response:  [name] met with clinician for individual therapy. Jennie discussed his psychiatric symptoms and current life events. Zacharias shared that things are cruising along as expected. He reports he continues to feel some relief that he has gotten his father settled with the [place]. He also reports that he continues to wait for the results of his tax issues with [employer]. Clinician explored mood and temperament. Gerome reports he has been a little depressed, not having much energy by the afternoon. Clinician explored daily routine with sleep and wake schedule. Clinician encouraged Giordan to incorporate more movement into his daily routine, in order to increase overall energy. Clinician also noted that medication may impact his energy level.  Trentin reports he is participating in [employer] weekly, which are going great. He  also reports hosting a small AA meeting once a week in his home. Clinician processed safety precautions he is taking and encouraged him to keep distance, fans, open windows and masks.  Khadar reports worry about his risk factors, as well as risk to his father. However, he reports that his need for safe socialization is worth the risk.   Plan: Return again in 1-2 weeks.  Diagnosis: Axis I: Major depressive disorder, moderate and Generalized Anxiety Disorder    I discussed the assessment and treatment plan with the patient. The patient was provided an opportunity to ask questions and all were answered. The patient agreed with the plan and demonstrated an understanding of the instructions.   The patient was advised to call back or seek an in-person evaluation if the symptoms worsen or if the condition fails to improve as anticipated.  I provided 45 minutes of non-face-to-face time during this encounter.   [name], LCSW

## 2019-01-02 ENCOUNTER — Encounter: Payer: Self-pay | Admitting: Hematology and Oncology

## 2019-01-02 ENCOUNTER — Encounter: Payer: Self-pay | Admitting: Family Medicine

## 2019-01-02 DIAGNOSIS — C61 Malignant neoplasm of prostate: Secondary | ICD-10-CM | POA: Diagnosis not present

## 2019-01-08 ENCOUNTER — Other Ambulatory Visit: Payer: Self-pay

## 2019-01-08 ENCOUNTER — Ambulatory Visit (INDEPENDENT_AMBULATORY_CARE_PROVIDER_SITE_OTHER): Payer: Medicare Other | Admitting: Licensed Clinical Social Worker

## 2019-01-08 ENCOUNTER — Encounter (HOSPITAL_COMMUNITY): Payer: Self-pay | Admitting: Licensed Clinical Social Worker

## 2019-01-08 DIAGNOSIS — F331 Major depressive disorder, recurrent, moderate: Secondary | ICD-10-CM | POA: Diagnosis not present

## 2019-01-08 DIAGNOSIS — F411 Generalized anxiety disorder: Secondary | ICD-10-CM

## 2019-01-08 NOTE — Progress Notes (Signed)
Virtual Visit via Telephone Note  I connected with Rodney Friedman on 01/08/19 at  9:00 AM EDT by telephone and verified that I am speaking with the correct person using two identifiers.   I discussed the limitations, risks, security and privacy concerns of performing an evaluation and management service by telephone and the availability of in person appointments. I also discussed with the patient that there may be a patient responsible charge related to this service. The patient expressed understanding and agreed to proceed.   Type of Therapy: Individual Therapy  Treatment Goals addressed: Improve psychiatric symptoms, elevate mood (decreased irritability, increased enjoyment of activities), Improve unhelpful thought patterns, emotional regulation skills (reduce temper outburst), Interpersonal relationship skills  Interventions: Motivational Interviewing and Other: Grounding and Mindfulness techniques  Summary: Rodney Friedman is a 73 y.o. male who presents with Major Depressive Disorder, moderate and Generalized Anxiety Disorder  Suicidal/Homicidal: No without intent/plan  Therapist Response:  Rodney Friedman met with clinician for individual therapy. Rodney Friedman discussed his psychiatric symptoms and current life events. Rodney Friedman shared that he has been continuing to handle the daily activities, healthcare concerns, and financial obligations for himself and his father. He reported that the anniversary of his mother's death is next month, so he has been having some flashbacks and memories of his childhood. Clinician assisted in Rodney Friedman's processing of his childhood and upbringing, especially by mother. Rodney Friedman identified some ongoing deep resentment for some poor parenting choices mother made, such as throwing out toys, not replacing torn clothing, etc. Clinician explored how mother was parented and identified that we learn from the past generations what to or what not to do. Clinician provided psychoeducation about  attachment theories, time frame when child development research was starting, and noted that most of the research that was done about childrearing coincided with Rodney Friedman's childhood. Clinician identified the importance of forgiveness in order to take the burden off him.      Plan: Return again in 1-2 weeks.  Diagnosis: Axis I: Major depressive disorder, moderate and Generalized Anxiety Disorder    I discussed the assessment and treatment plan with the patient. The patient was provided an opportunity to ask questions and all were answered. The patient agreed with the plan and demonstrated an understanding of the instructions.   The patient was advised to call back or seek an in-person evaluation if the symptoms worsen or if the condition fails to improve as anticipated.  I provided 45 minutes of non-face-to-face time during this encounter.   [name], LCSW

## 2019-01-22 ENCOUNTER — Ambulatory Visit (INDEPENDENT_AMBULATORY_CARE_PROVIDER_SITE_OTHER): Payer: Medicare Other | Admitting: Licensed Clinical Social Worker

## 2019-01-22 ENCOUNTER — Ambulatory Visit (INDEPENDENT_AMBULATORY_CARE_PROVIDER_SITE_OTHER): Payer: Medicare Other | Admitting: Pharmacist Clinician (PhC)/ Clinical Pharmacy Specialist

## 2019-01-22 ENCOUNTER — Encounter (HOSPITAL_COMMUNITY): Payer: Self-pay | Admitting: Licensed Clinical Social Worker

## 2019-01-22 ENCOUNTER — Other Ambulatory Visit: Payer: Self-pay

## 2019-01-22 DIAGNOSIS — F331 Major depressive disorder, recurrent, moderate: Secondary | ICD-10-CM | POA: Diagnosis not present

## 2019-01-22 DIAGNOSIS — Z7901 Long term (current) use of anticoagulants: Secondary | ICD-10-CM

## 2019-01-22 DIAGNOSIS — I482 Chronic atrial fibrillation, unspecified: Secondary | ICD-10-CM

## 2019-01-22 DIAGNOSIS — F411 Generalized anxiety disorder: Secondary | ICD-10-CM

## 2019-01-22 DIAGNOSIS — I480 Paroxysmal atrial fibrillation: Secondary | ICD-10-CM | POA: Diagnosis not present

## 2019-01-22 DIAGNOSIS — Z23 Encounter for immunization: Secondary | ICD-10-CM | POA: Diagnosis not present

## 2019-01-22 LAB — POCT INR: INR: 1.6 — AB (ref 2.0–3.0)

## 2019-01-22 NOTE — Progress Notes (Signed)
Virtual Visit via Video Note  I connected with Rodney Friedman on 01/22/19 at  9:00 AM EDT by a video enabled telemedicine application and verified that I am speaking with the correct person using two identifiers.    I discussed the limitations of evaluation and management by telemedicine and the availability of in person appointments. The patient expressed understanding and agreed to proceed.  Type of Therapy: Individual Therapy  Treatment Goals addressed: Improve psychiatric symptoms, elevate mood (decreased irritability, increased enjoyment of activities), Improve unhelpful thought patterns, emotional regulation skills (reduce temper outburst), Interpersonal relationship skills  Interventions: Motivational Interviewing and Other: Grounding and Mindfulness techniques  Summary: Rodney Friedman is a 73 y.o. male who presents with Major Depressive Disorder, moderate and Generalized Anxiety Disorder  Suicidal/Homicidal: No without intent/plan  Therapist Response:  Rodney Friedman met with clinician for individual therapy. Rodney Friedman discussed his psychiatric symptoms and current life events. Rodney Friedman shared that he has been doing okay. He reports ongoing case management and care coordination that he has been doing for himself and his father. He also reports he has been thinking more about his mother due to the anniversary of her death coming up in a couple of weeks. Clinician processed these memories and identified the importance of making the decision of what to hold on to and what to let go. Clinician encouraged Rodney Friedman to focus on positive memories. Rodney Friedman discussed his use of carving wood as a way to focus attention and meditate. Clinician processed the value of this activity and encouraged him to continue and to also seek other creative outlets for his energy.  Clinician discussed relationship with his daughter and noted some improvement in their communication, as well as some comfort that his daughter has been in  recovery for over 1 year now and she is making more contact with her children. Rodney Friedman discussed his ongoing deep seeded resentment for what she has done over the years. However, he understands that he cannot let her know how much he resents her, as it would disrupt her sobriety.    Plan: Return again in 1-2 weeks.  Diagnosis: Axis I: Major depressive disorder, moderate and Generalized Anxiety Disorder   I discussed the assessment and treatment plan with the patient. The patient was provided an opportunity to ask questions and all were answered. The patient agreed with the plan and demonstrated an understanding of the instructions.   The patient was advised to call back or seek an in-person evaluation if the symptoms worsen or if the condition fails to improve as anticipated.  I provided 45 minutes of non-face-to-face time during this encounter.   [name], LCSW

## 2019-01-22 NOTE — Patient Instructions (Signed)
Take 2 tablets today Wednesday Sept 2, then continue with 1 tablet daily except 1/2 tablet each Tuesday and Friday. Repeat INR in 3 weeks

## 2019-01-27 ENCOUNTER — Encounter: Payer: Self-pay | Admitting: Family Medicine

## 2019-01-27 ENCOUNTER — Encounter: Payer: Self-pay | Admitting: Hematology and Oncology

## 2019-02-05 ENCOUNTER — Ambulatory Visit (INDEPENDENT_AMBULATORY_CARE_PROVIDER_SITE_OTHER): Payer: Medicare Other | Admitting: Licensed Clinical Social Worker

## 2019-02-05 ENCOUNTER — Other Ambulatory Visit: Payer: Self-pay

## 2019-02-05 ENCOUNTER — Encounter (HOSPITAL_COMMUNITY): Payer: Self-pay | Admitting: Licensed Clinical Social Worker

## 2019-02-05 DIAGNOSIS — F411 Generalized anxiety disorder: Secondary | ICD-10-CM | POA: Diagnosis not present

## 2019-02-05 DIAGNOSIS — F331 Major depressive disorder, recurrent, moderate: Secondary | ICD-10-CM | POA: Diagnosis not present

## 2019-02-05 NOTE — Progress Notes (Signed)
Virtual Visit via Video Note  I connected with Rodney Friedman on 02/05/19 at  9:00 AM EDT by a video enabled telemedicine application and verified that I am speaking with the correct person using two identifiers.     I discussed the limitations of evaluation and management by telemedicine and the availability of in person appointments. The patient expressed understanding and agreed to proceed.  Type of Therapy: Individual Therapy  Treatment Goals addressed: Improve psychiatric symptoms, elevate mood (decreased irritability, increased enjoyment of activities), Improve unhelpful thought patterns, emotional regulation skills (reduce temper outburst), Interpersonal relationship skills  Interventions: Motivational Interviewing and Other: Grounding and Mindfulness techniques  Summary: Rodney Friedman is a 73 y.o. male who presents with Major Depressive Disorder, moderate and Generalized Anxiety Disorder  Suicidal/Homicidal: No without intent/plan  Therapist Response:  [name] met with clinician for individual therapy. [name] discussed his psychiatric symptoms and current life events. Rodney Friedman shared that he has been very frustrated and angry over daily activities and chores. He reports aggravation over trying to connect with Z[employer] with his family in [place]. He also identified frustration with getting his father's [place] ID renewed and explained the runaround he was getting from the [facility]. He reports he heard from the IRS, but had difficulty making out the message left on his voice mail, so he got irate over this. Clinician processed the theme of things being complicated and Rodney Friedman having no control. Rodney Friedman agreed that this was a trigger for him. He also reports he gets tired on the daily routine of cooking, washing dishes, maintaining the household cleanliness, etc. Clinician explored sleep and overall energy level. Rodney Friedman identified that he is only taking the Gabapentin once at night to sleep. He is thinking about going off  it completely, but will consult with Dr. Casimiro Needle before he does this. Clinician explored the purpose of the Gabapentin and noted that his anxiety is easily triggered into anger, which is problematic. He reported that if he takes the Gabapentin during the day, he gets too tired.   Plan: Return again in 1-2 weeks.  Diagnosis: Axis I: Major depressive disorder, moderate and Generalized Anxiety Disorder    I discussed the assessment and treatment plan with the patient. The patient was provided an opportunity to ask questions and all were answered. The patient agreed with the plan and demonstrated an understanding of the instructions.   The patient was advised to call back or seek an in-person evaluation if the symptoms worsen or if the condition fails to improve as anticipated.  I provided 60 minutes of non-face-to-face time during this encounter.   [name], LCSW

## 2019-02-12 ENCOUNTER — Ambulatory Visit (INDEPENDENT_AMBULATORY_CARE_PROVIDER_SITE_OTHER): Payer: Medicare Other | Admitting: Pharmacist Clinician (PhC)/ Clinical Pharmacy Specialist

## 2019-02-12 ENCOUNTER — Other Ambulatory Visit: Payer: Self-pay

## 2019-02-12 DIAGNOSIS — I482 Chronic atrial fibrillation, unspecified: Secondary | ICD-10-CM | POA: Diagnosis not present

## 2019-02-12 DIAGNOSIS — I480 Paroxysmal atrial fibrillation: Secondary | ICD-10-CM

## 2019-02-12 DIAGNOSIS — Z7901 Long term (current) use of anticoagulants: Secondary | ICD-10-CM | POA: Diagnosis not present

## 2019-02-12 LAB — POCT INR: INR: 2 (ref 2.0–3.0)

## 2019-02-19 ENCOUNTER — Ambulatory Visit (HOSPITAL_COMMUNITY): Payer: Medicare Other | Admitting: Licensed Clinical Social Worker

## 2019-03-03 DIAGNOSIS — L570 Actinic keratosis: Secondary | ICD-10-CM | POA: Diagnosis not present

## 2019-03-03 DIAGNOSIS — L814 Other melanin hyperpigmentation: Secondary | ICD-10-CM | POA: Diagnosis not present

## 2019-03-03 DIAGNOSIS — Z23 Encounter for immunization: Secondary | ICD-10-CM | POA: Diagnosis not present

## 2019-03-03 DIAGNOSIS — D225 Melanocytic nevi of trunk: Secondary | ICD-10-CM | POA: Diagnosis not present

## 2019-03-03 DIAGNOSIS — L0100 Impetigo, unspecified: Secondary | ICD-10-CM | POA: Diagnosis not present

## 2019-03-03 DIAGNOSIS — L219 Seborrheic dermatitis, unspecified: Secondary | ICD-10-CM | POA: Diagnosis not present

## 2019-03-03 DIAGNOSIS — L821 Other seborrheic keratosis: Secondary | ICD-10-CM | POA: Diagnosis not present

## 2019-03-04 ENCOUNTER — Ambulatory Visit (INDEPENDENT_AMBULATORY_CARE_PROVIDER_SITE_OTHER): Payer: Medicare Other | Admitting: Licensed Clinical Social Worker

## 2019-03-04 ENCOUNTER — Encounter (HOSPITAL_COMMUNITY): Payer: Self-pay | Admitting: Licensed Clinical Social Worker

## 2019-03-04 ENCOUNTER — Encounter: Payer: Self-pay | Admitting: Family Medicine

## 2019-03-04 DIAGNOSIS — F411 Generalized anxiety disorder: Secondary | ICD-10-CM | POA: Diagnosis not present

## 2019-03-04 DIAGNOSIS — F331 Major depressive disorder, recurrent, moderate: Secondary | ICD-10-CM | POA: Diagnosis not present
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
## 2019-03-13 ENCOUNTER — Other Ambulatory Visit: Payer: Self-pay

## 2019-03-13 ENCOUNTER — Ambulatory Visit (INDEPENDENT_AMBULATORY_CARE_PROVIDER_SITE_OTHER): Payer: Medicare Other | Admitting: Pharmacist

## 2019-03-13 ENCOUNTER — Encounter: Payer: Self-pay | Admitting: Cardiovascular Disease

## 2019-03-13 ENCOUNTER — Other Ambulatory Visit: Payer: Self-pay | Admitting: Pharmacist

## 2019-03-13 ENCOUNTER — Ambulatory Visit (INDEPENDENT_AMBULATORY_CARE_PROVIDER_SITE_OTHER): Payer: Medicare Other | Admitting: Cardiovascular Disease

## 2019-03-13 VITALS — BP 146/56 | HR 54 | Ht 69.0 in | Wt 229.0 lb

## 2019-03-13 DIAGNOSIS — I480 Paroxysmal atrial fibrillation: Secondary | ICD-10-CM | POA: Diagnosis not present

## 2019-03-13 DIAGNOSIS — C9110 Chronic lymphocytic leukemia of B-cell type not having achieved remission: Secondary | ICD-10-CM

## 2019-03-13 DIAGNOSIS — I25709 Atherosclerosis of coronary artery bypass graft(s), unspecified, with unspecified angina pectoris: Secondary | ICD-10-CM

## 2019-03-13 DIAGNOSIS — I10 Essential (primary) hypertension: Secondary | ICD-10-CM | POA: Diagnosis not present

## 2019-03-13 DIAGNOSIS — G4733 Obstructive sleep apnea (adult) (pediatric): Secondary | ICD-10-CM

## 2019-03-13 DIAGNOSIS — Z7901 Long term (current) use of anticoagulants: Secondary | ICD-10-CM

## 2019-03-13 DIAGNOSIS — F17200 Nicotine dependence, unspecified, uncomplicated: Secondary | ICD-10-CM | POA: Diagnosis not present

## 2019-03-13 DIAGNOSIS — I482 Chronic atrial fibrillation, unspecified: Secondary | ICD-10-CM

## 2019-03-13 DIAGNOSIS — I350 Nonrheumatic aortic (valve) stenosis: Secondary | ICD-10-CM

## 2019-03-13 DIAGNOSIS — E782 Mixed hyperlipidemia: Secondary | ICD-10-CM | POA: Diagnosis not present

## 2019-03-13 DIAGNOSIS — C61 Malignant neoplasm of prostate: Secondary | ICD-10-CM

## 2019-03-13 DIAGNOSIS — I25118 Atherosclerotic heart disease of native coronary artery with other forms of angina pectoris: Secondary | ICD-10-CM

## 2019-03-13 DIAGNOSIS — R0989 Other specified symptoms and signs involving the circulatory and respiratory systems: Secondary | ICD-10-CM | POA: Diagnosis not present

## 2019-03-13 DIAGNOSIS — E669 Obesity, unspecified: Secondary | ICD-10-CM | POA: Diagnosis not present

## 2019-03-13 DIAGNOSIS — E1169 Type 2 diabetes mellitus with other specified complication: Secondary | ICD-10-CM | POA: Diagnosis not present

## 2019-03-13 LAB — LIPID PANEL
Chol/HDL Ratio: 4.1 ratio (ref 0.0–5.0)
Cholesterol, Total: 119 mg/dL (ref 100–199)
HDL: 29 mg/dL — ABNORMAL LOW (ref >39)
LDL Chol Calc (NIH): 48 mg/dL (ref 0–99)
Triglycerides: 271 mg/dL — ABNORMAL HIGH (ref 0–149)
VLDL Cholesterol Cal: 42 mg/dL — ABNORMAL HIGH (ref 5–40)

## 2019-03-13 LAB — POCT INR: INR: 2.4 (ref 2.0–3.0)

## 2019-03-13 MED ORDER — AMLODIPINE BESYLATE 10 MG PO TABS: 10.0000 mg | ORAL_TABLET | Freq: Every day | ORAL | 3 refills | Status: DC

## 2019-03-13 MED ORDER — METOPROLOL SUCCINATE ER 200 MG PO TB24: 100.0000 mg | ORAL_TABLET | Freq: Two times a day (BID) | ORAL | 3 refills | Status: DC

## 2019-03-13 MED ORDER — VALSARTAN 320 MG PO TABS: 320.0000 mg | ORAL_TABLET | Freq: Every day | ORAL | 3 refills | Status: DC

## 2019-03-13 MED ORDER — WARFARIN SODIUM 4 MG PO TABS: ORAL_TABLET | ORAL | 1 refills | Status: DC

## 2019-03-13 NOTE — Patient Instructions (Signed)
Medication Instructions:  No changes *If you need a refill on your cardiac medications before your next appointment, please call your pharmacy*  Lab Work: Your provider would like for you to have the following labs today: lipids  If you have labs (blood work) drawn today and your tests are completely normal, you will receive your results only by: Marland Kitchen MyChart Message (if you have MyChart) OR . A paper copy in the mail If you have any lab test that is abnormal or we need to change your treatment, we will call you to review the results.  Testing/Procedures: None ordered  Follow-Up: At [facility], you and your health needs are our priority.  As part of our continuing mission to provide you with exceptional heart care, we have created designated Provider Care Teams.  These Care Teams include your primary Cardiologist (physician) and Advanced Practice Providers (APPs -  Physician Assistants and Nurse Practitioners) who all work together to provide you with the care you need, when you need it.  Your next appointment:   6 months  The format for your next appointment:   In Person  Provider:   Sanda Klein, MD

## 2019-03-13 NOTE — Progress Notes (Signed)
Cardiology Office Note    Date:  03/19/2019   ID:  Rodney Friedman, DOB [birth year], MRN [id number]  PCP:  Rodney Agreste, MD  Cardiologist:   Rodney Klein, MD   chief complaint: CAD, AFib, AS, HTN, HLP   History of Present Illness:  Rodney Friedman is a 73 y.o. male with CAD and remote CABG, paroxysmal atrial fibrillation, mild aortic stenosis, carotid artery stenosis, hypertension, hyperlipidemia and type 2 diabetes mellitus.  He is feeling well. He helps take care of his granddaughter while his son works and continues to care for his elderly father (Rodney Friedman).   The patient specifically denies any chest pain at rest exertion, dyspnea at rest or with exertion, orthopnea, paroxysmal nocturnal dyspnea, syncope, palpitations, focal neurological deficits, intermittent claudication, lower extremity edema, unexplained weight gain, cough, hemoptysis or wheezing.  He has gained a little weight and his BMI almost 34.  He continues to smoke half a pack of cigarettes daily but has been alcohol abstinent for almost 20 years. He  is well versed in the theoretical aspects of addiction since he has a Masters degree on the topic.   INR has been within therapeutic range and he denies any bleeding problems or injuries  Despite rosuvastatin + ezetimibe, labs from October 30, 2018 showed an LDL cholesterol 111, HDL 31 and a hemoglobin A1c of 7.3%.  He has a history of coronary disease and underwent 3 vessel bypass surgery in [place] in 2006 ("heartburn" and left antecubital pain ) in July 2014 he had an episode of atrial fibrillation. He has significant left ventricular hypertrophy and very mild aortic valve stenosis. He has preserved left ventricular systolic function although there is some degree of anterior hypokinesis by previous echo. His nuclear stress test in June 2013 showed normal pattern of perfusion and an ejection fraction of 52%. He has bilateral carotid bruits that are reportedly due to  external carotid artery stenoses. His last carotid ultrasound was performed in September 2017 and showed no significant internal carotid stenosis.  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 He has treated prostate cancer and is followed by Dr. Alvy Friedman.  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 Unfortunately he still smokes roughly 10 cigarettes a day , which he believes he needs due to anxiety.   Past Medical History:  Diagnosis Date  . Adenomatous colon polyp   . Alcoholism ([place])   . Anxiety   . Atrial fibrillation ([place])   . CAD (coronary artery disease)   . Cataract    removed left eye   . CLL (chronic lymphocytic leukemia) ([place]) 07/08/2013  . Depression   . Diabetes mellitus without complication ([place])   . Diverticulosis   . Eye abnormality    Macular scarring R eye  . Glaucoma   . Heart murmur   . HLD (hyperlipidemia)   . Hypertension   . Leukemia ([place])    CLL  . Myocardial infarction ([place]) 2006  . Prostate cancer ([place]) 07/08/2013   Rodney Friedman at [facility]- getting Lupron shot every 6 months - traces in prostate and 2 lymphnodes left hip- non focused traces per pt   . Sleep apnea   . Substance abuse ([place])   . 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 Arm Surgery Right  from door accident with glass  . CATARACT EXTRACTION W/ INTRAOCULAR LENS IMPLANT Left   . COLONOSCOPY    . 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 POLYPECTOMY      Current Medications: Outpatient Medications Prior to Visit  Medication Sig Dispense Refill  . B Complex Vitamins (VITAMIN-B COMPLEX PO) Take by mouth.    Marland Kitchen BIOTIN PO Take by mouth.    Marland Kitchen buPROPion (WELLBUTRIN XL) 300 MG 24 hr tablet 1  qam 90 tablet 2  . Calcium Carbonate-Vit D-Min (CALCIUM 1200 PO) Take 1,200 mg by mouth daily.    Mariane Baumgarten Calcium (STOOL  SOFTENER PO) Take 1 tablet as needed by mouth.    . ezetimibe (ZETIA) 10 MG tablet Take 1 tablet (10 mg total) by mouth daily. 90 tablet 3  . gabapentin (NEURONTIN) 100 MG capsule Take 1 capsule (100 mg total) by mouth daily. 90 capsule 4  . ketoconazole (NIZORAL) 2 % shampoo Apply 1 application topically 2 (two) times a week. 120 mL 1  . latanoprost (XALATAN) 0.005 % ophthalmic solution Once a day every second day    . Leuprolide Acetate, 6 Month, (LUPRON) 45 MG injection Inject 45 mg into the muscle every 6 (six) months.    Marland Kitchen LYCOPENE PO Take 8 mg by mouth daily.    . metFORMIN (GLUCOPHAGE) 500 MG tablet TAKE 1 TABLET BY MOUTH EVERY EVENING 90 tablet 1  . Omega-3 Fatty Acids (FISH OIL) 1000 MG CAPS Take 1,000 mg by mouth daily.    . rosuvastatin (CRESTOR) 20 MG tablet Take 1 tablet (20 mg total) by mouth daily. 90 tablet 3  . vitamin B-12 (CYANOCOBALAMIN) 500 MCG tablet Take 500 mcg by mouth daily.    . vitamin C (ASCORBIC ACID) 500 MG tablet Take 500 mg by mouth daily.    Marland Kitchen amLODipine (NORVASC) 10 MG tablet Take 1 tablet (10 mg total) by mouth daily. 90 tablet 3  . metoprolol (TOPROL-XL) 200 MG 24 hr tablet Take 0.5 tablets (100 mg total) by mouth 2 (two) times daily. Take with or immediately following a meal. 90 tablet 3  . valsartan (DIOVAN) 320 MG tablet Take 1 tablet (320 mg total) by mouth daily. 90 tablet 3  . warfarin (COUMADIN) 4 MG tablet TAKE 1/2 TO 1 TABLET DAILY AS DIRECTED BY COUMADIN CLINIC 90 tablet 1   Facility-Administered Medications Prior to Visit  Medication Dose Route Frequency Provider Last Rate Last Dose  . 0.9 %  sodium chloride infusion  500 mL Intravenous Once Doran Stabler, MD         Allergies:   Other   Social History   Socioeconomic History  . Marital status: Divorced    Spouse name: Not on file  . Number of children: 2  . Years of education: 59  . Highest education level: Not on file  Occupational History  . Occupation: Retired Insurance claims handler  . Financial resource strain: Not on file  . Food insecurity    Worry: Not on file    Inability: Not on file  . Transportation needs    Medical: Not on file    Non-medical: Not on file  Tobacco Use  . Smoking status: Current Every Day Smoker    Packs/day: 0.50    Years: 50.00    Pack years: 25.00    Types: Cigarettes  . Smokeless tobacco: Never Used  Substance and Sexual Activity  . Alcohol use: No    Alcohol/week: 0.0  standard drinks  . Drug use: No  . Sexual activity: Not Currently  Lifestyle  . Physical activity    Days per week: Not on file    Minutes per session: Not on file  . Stress: Not on file  Relationships  . Social Herbalist on phone: Not on file    Gets together: Not on file    Attends religious service: Not on file    Active member of club or organization: Not on file    Attends meetings of clubs or organizations: Not on file    Relationship status: Not on file  Other Topics Concern  . Not on file  Social History Narrative   Lives at home w/ his father   Divorced   Right-handed   Education: College   No caffeine     Family History:  The patient's family history includes Colon cancer in his mother; Hypertension in his father; Ovarian cancer in his mother; Prostate cancer in his father; Uterine cancer in his mother.   ROS:   Please see the history of present illness.    All other systems are reviewed and are negative.   PHYSICAL EXAM:   VS:  BP (!) 146/56   Pulse (!) 54   Ht 5\' 9"  (1.753 m)   Wt 229 lb (103.9 kg)   SpO2 99%   BMI 33.82 kg/m      General: Alert, oriented x3, no distress, obese Head: no evidence of trauma, PERRL, EOMI, no exophtalmos or lid lag, no myxedema, no xanthelasma; normal ears, nose and oropharynx Neck: normal jugular venous pulsations and no hepatojugular reflux; brisk carotid pulses without delay and no carotid bruits Chest: clear to auscultation, no signs of consolidation by percussion or palpation,  normal fremitus, symmetrical and full respiratory excursions Cardiovascular: normal position and quality of the apical impulse, regular rhythm, normal first and second heart sounds, 3/6 Ao ejection murmur, no diastolic murmurs, rubs or gallops Abdomen: no tenderness or distention, no masses by palpation, no abnormal pulsatility or arterial bruits, normal bowel sounds, no hepatosplenomegaly Extremities: no clubbing, cyanosis or edema; 2+ radial, ulnar and brachial pulses bilaterally; 2+ right femoral, posterior tibial and dorsalis pedis pulses; 2+ left femoral, posterior tibial and dorsalis pedis pulses; no subclavian or femoral bruits Neurological: grossly nonfocal Psych: Normal mood and affect    Wt Readings from Last 3 Encounters:  03/13/19 229 lb (103.9 kg)  10/30/18 231 lb 3.2 oz (104.9 kg)  07/26/18 229 lb 6.4 oz (104.1 kg)      Studies/Labs Reviewed:   EKG:  EKG is not ordered today.  Recent Labs: 07/22/2018: Hemoglobin 13.1; Platelets 269 10/30/2018: ALT 40; BUN 17; Creatinine, Ser 1.02; Potassium 4.5; Sodium 144   Lipid Panel    Component Value Date/Time   CHOL 119 03/13/2019 0914   TRIG 271 (H) 03/13/2019 0914   HDL 29 (L) 03/13/2019 0914   CHOLHDL 4.1 03/13/2019 0914   CHOLHDL 5.8 (H) 03/10/2016 0830   VLDL 41 (H) 03/10/2016 0830   LDLCALC 48 03/13/2019 0914    ASSESSMENT:    1. Paroxysmal atrial fibrillation (HCC)   2. Long term (current) use of anticoagulants   3. Coronary artery disease of native artery of native heart with stable angina pectoris ([place])   4. Aortic valve stenosis, nonrheumatic   5. Mixed hyperlipidemia   6. Diabetes mellitus type 2 in obese (HCC)   7. Smoking   8. Bilateral carotid bruits   9. OSA (obstructive  sleep apnea)   10. Mild obesity   11. Essential hypertension   12. CLL (chronic lymphocytic leukemia) ([place])   13. Prostate cancer ([place])      PLAN:  In order of problems listed above:  1. AFib: In sinus bradycardia today. We  increased the beta blocker earlier this year when he had AF w RVR. CHADSVasc 4 (age, HTN, CAD, DM).   2. Warfarin:  no bleeding complications. Therapeutic INR today at 2.4. 3. CAD: no angina on 2 agents. 4. AS: mild and asymptomatic. Has not had echo in >5 years and we should reassess, but he is reluctant due to coronavirus and cost issues. OK to defer while asymptomatic. 5. HLP: LDL is down to 48, a better than expected improvement. HDL and TG are not good, not surprising with weight gain and worsened glycemic control. 6. DM: has gained weight and glycemic control has deteriorated. 7. Smoking: Not ready to try to quit.  He is proud to report that he has been sober from alcohol and drugs for 18 years and still goes to [employer]. 8. Carotid bruits: has external carotid artery stenoses.  He only has mild plaque in the internal carotids.  No neurological complaints. 9. OSA: Intolerant to CPAP.  He does not want to try this again. 10. Obesity: has gained weight. 11. HTN: Fair control, DBP is quite low. 12. CLL:  Not requiring treatment. No anemia or thrombocytopenia.  Followed by Dr. Alvy Friedman. 13. Prostate Ca: PSA remains very low at 0.04.    Medication Adjustments/Labs and Tests Ordered: Current medicines are reviewed at length with the patient today.  Concerns regarding medicines are outlined above.  Medication changes, Labs and Tests ordered today are listed in the Patient Instructions below. Patient Instructions  Medication Instructions:  No changes *If you need a refill on your cardiac medications before your next appointment, please call your pharmacy*  Lab Work: Your provider would like for you to have the following labs today: lipids  If you have labs (blood work) drawn today and your tests are completely normal, you will receive your results only by: Marland Kitchen MyChart Message (if you have MyChart) OR . A paper copy in the mail If you have any lab test that is abnormal or we need to change  your treatment, we will call you to review the results.  Testing/Procedures: None ordered  Follow-Up: At [facility], you and your health needs are our priority.  As part of our continuing mission to provide you with exceptional heart care, we have created designated Provider Care Teams.  These Care Teams include your primary Cardiologist (physician) and Advanced Practice Providers (APPs -  Physician Assistants and Nurse Practitioners) who all work together to provide you with the care you need, when you need it.  Your next appointment:   6 months  The format for your next appointment:   In Person  Provider:   Sanda Klein, MD     Signed, Rodney Klein, MD  03/19/2019 7:08 PM    [address], [place], [place]  [address] Phone: [phone]; Fax: [phone]

## 2019-03-14 DIAGNOSIS — G8929 Other chronic pain: Secondary | ICD-10-CM | POA: Insufficient documentation

## 2019-03-18 ENCOUNTER — Other Ambulatory Visit: Payer: Self-pay

## 2019-03-18 ENCOUNTER — Encounter (HOSPITAL_COMMUNITY): Payer: Self-pay | Admitting: Licensed Clinical Social Worker

## 2019-03-18 ENCOUNTER — Ambulatory Visit (INDEPENDENT_AMBULATORY_CARE_PROVIDER_SITE_OTHER): Payer: Medicare Other | Admitting: Licensed Clinical Social Worker

## 2019-03-18 DIAGNOSIS — F411 Generalized anxiety disorder: Secondary | ICD-10-CM

## 2019-03-18 DIAGNOSIS — F330 Major depressive disorder, recurrent, mild: Secondary | ICD-10-CM | POA: Diagnosis not present

## 2019-03-18 NOTE — Progress Notes (Signed)
Virtual Visit via Video Note  I connected with Rodney Friedman on 03/18/19 at  9:00 AM EDT by a video enabled telemedicine application and verified that I am speaking with the correct person using two identifiers.    I discussed the limitations of evaluation and management by telemedicine and the availability of in person appointments. The patient expressed understanding and agreed to proceed.  Type of Therapy: Individual Therapy  Treatment Goals addressed: Improve psychiatric symptoms, elevate mood (decreased irritability, increased enjoyment of activities), Improve unhelpful thought patterns, emotional regulation skills (reduce temper outburst), Interpersonal relationship skills  Interventions: Motivational Interviewing and Other: Grounding and Mindfulness techniques  Summary: Rodney Friedman is a 73y.o. male who presents with Major Depressive Disorder, moderate and Generalized Anxiety Disorder  Suicidal/Homicidal: No without intent/plan  Therapist Response:  Rodney Friedman met with clinician for individual therapy. Rodney Friedman discussed his psychiatric symptoms and current life events. Rodney Friedman sharedthat there have been some bright changes, as well as some frustrations. Clinician processed improvement in his son's motivation to complete the list of tasks for his daughter, who is now in his custody. Clinician discussed other options for enrolling granddaughter in school. Rodney Friedman agreed to provide feedback to his son, but also to let him take the lead on completing the tasks that need to be done. Rodney Friedman updated clinician on concerns about oldest granddaughter in [place]. Clinician discussed these concerns and explored Rodney Friedman's thoughts and feelings using MI OARS. Clinician reflected the importance of not owning other people's problems and also keeping his side of the street clean.    Plan: Return again in 1-2 weeks.  Diagnosis: Axis I: Major depressive disorder, moderate and Generalized Anxiety Disorder      I discussed the assessment and treatment plan with the patient. The patient was provided an opportunity to ask questions and all were answered. The patient agreed with the plan and demonstrated an understanding of the instructions.   The patient was advised to call back or seek an in-person evaluation if the symptoms worsen or if the condition fails to improve as anticipated.  I provided 45 minutes of non-face-to-face time during this encounter.   [name], LCSW

## 2019-03-19 ENCOUNTER — Encounter: Payer: Self-pay | Admitting: Cardiovascular Disease

## 2019-04-02 ENCOUNTER — Other Ambulatory Visit: Payer: Self-pay

## 2019-04-02 ENCOUNTER — Ambulatory Visit (INDEPENDENT_AMBULATORY_CARE_PROVIDER_SITE_OTHER): Payer: Medicare Other | Admitting: Licensed Clinical Social Worker

## 2019-04-02 ENCOUNTER — Encounter (HOSPITAL_COMMUNITY): Payer: Self-pay | Admitting: Licensed Clinical Social Worker

## 2019-04-02 DIAGNOSIS — F411 Generalized anxiety disorder: Secondary | ICD-10-CM | POA: Diagnosis not present

## 2019-04-02 DIAGNOSIS — F331 Major depressive disorder, recurrent, moderate: Secondary | ICD-10-CM | POA: Diagnosis not present

## 2019-04-02 NOTE — Progress Notes (Signed)
Virtual Visit via Video Note  I connected with Rodney Friedman on 04/02/19 at  9:00 AM EST by a video enabled telemedicine application and verified that I am speaking with the correct person using two identifiers.    I discussed the limitations of evaluation and management by telemedicine and the availability of in person appointments. The patient expressed understanding and agreed to proceed.  Type of Therapy: Individual Therapy  Treatment Goals addressed: Improve psychiatric symptoms, elevate mood (decreased irritability, increased enjoyment of activities), Improve unhelpful thought patterns, emotional regulation skills (reduce temper outburst), Interpersonal relationship skills  Interventions: Motivational Interviewing and Other: Grounding and Mindfulness techniques  Summary: Rodney Friedman is a 73y.o. male who presents with Major Depressive Disorder, moderate and Generalized Anxiety Disorder  Suicidal/Homicidal: No without intent/plan  Therapist Response:  [name] met with clinician for individual therapy. Rodney Friedman discussed his psychiatric symptoms and current life events. Rodney Friedman sharedthatthere have been ongoing concerns about his granddaughter. He reported an incident where she walked in on him while he was getting dressed. He was wearing boxer shorts and was pulling on his pants. This was reported to her father to be "uncomfortable" for her. Rodney Friedman reported this brought back a significant amount of concern and worry that this has damaged the relationship with her. Clinician provided feedback to Rodney Friedman on ways to address this, including setting boundaries about knocking when doors are closed. Clinician also encouraged Rodney Friedman to make amends and clear the air to move forward.  Rodney Friedman reports he has already done research about alternative daycare options for her, as he worries this will be problematic in the future. Clinician offered support in coping with this and moving forward. Clinician also  explored options for getting granddaughter in counseling as well.  Rodney Friedman discussed other issues with physical health and how that has impacted his mood and health. He noted particular struggles with his dental health and identified a great deal of dental work in the future. He reports teeth have been destroyed due to combo of medications over the years, as well as dry mouth.   Plan: Return again in 1-2 weeks.    I discussed the assessment and treatment plan with the patient. The patient was provided an opportunity to ask questions and all were answered. The patient agreed with the plan and demonstrated an understanding of the instructions.   The patient was advised to call back or seek an in-person evaluation if the symptoms worsen or if the condition fails to improve as anticipated.  I provided 55 minutes of non-face-to-face time during this encounter.   Rodney Curling, LCSW

## 2019-04-07 ENCOUNTER — Ambulatory Visit (INDEPENDENT_AMBULATORY_CARE_PROVIDER_SITE_OTHER): Payer: Medicare Other | Admitting: Family Medicine

## 2019-04-07 VITALS — BP 146/56 | Ht 69.0 in | Wt 229.0 lb

## 2019-04-07 DIAGNOSIS — Z0000 Encounter for general adult medical examination without abnormal findings: Secondary | ICD-10-CM | POA: Diagnosis not present

## 2019-04-07 NOTE — Patient Instructions (Signed)
Thank you for taking time to come for your Medicare Wellness Visit. I appreciate your ongoing commitment to your health goals. Please review the following plan we discussed and let me know if I can assist you in the future.  Rodney Kennedy LPN  Preventive Care 64 Years and Older, Male Preventive care refers to lifestyle choices and visits with your health care provider that can promote health and wellness. This includes:  A yearly physical exam. This is also called an annual well check.  Regular dental and eye exams.  Immunizations.  Screening for certain conditions.  Healthy lifestyle choices, such as diet and exercise. What can I expect for my preventive care visit? Physical exam Your health care provider will check:  Height and weight. These may be used to calculate body mass index (BMI), which is a measurement that tells if you are at a healthy weight.  Heart rate and blood pressure.  Your skin for abnormal spots. Counseling Your health care provider may ask you questions about:  Alcohol, tobacco, and drug use.  Emotional well-being.  Home and relationship well-being.  Sexual activity.  Eating habits.  History of falls.  Memory and ability to understand (cognition).  Work and work Statistician. What immunizations do I need?  Influenza (flu) vaccine  This is recommended every year. Tetanus, diphtheria, and pertussis (Tdap) vaccine  You may need a Td booster every 10 years. Varicella (chickenpox) vaccine  You may need this vaccine if you have not already been vaccinated. Zoster (shingles) vaccine  You may need this after age 66. Pneumococcal conjugate (PCV13) vaccine  One dose is recommended after age 84. Pneumococcal polysaccharide (PPSV23) vaccine  One dose is recommended after age 88. Measles, mumps, and rubella (MMR) vaccine  You may need at least one dose of MMR if you were born in 1957 or later. You may also need a second dose. Meningococcal  conjugate (MenACWY) vaccine  You may need this if you have certain conditions. Hepatitis A vaccine  You may need this if you have certain conditions or if you travel or work in places where you may be exposed to hepatitis A. Hepatitis B vaccine  You may need this if you have certain conditions or if you travel or work in places where you may be exposed to hepatitis B. Haemophilus influenzae type b (Hib) vaccine  You may need this if you have certain conditions. You may receive vaccines as individual doses or as more than one vaccine together in one shot (combination vaccines). Talk with your health care provider about the risks and benefits of combination vaccines. What tests do I need? Blood tests  Lipid and cholesterol levels. These may be checked every 5 years, or more frequently depending on your overall health.  Hepatitis C test.  Hepatitis B test. Screening  Lung cancer screening. You may have this screening every year starting at age 24 if you have a 30-pack-year history of smoking and currently smoke or have quit within the past 15 years.  Colorectal cancer screening. All adults should have this screening starting at age 52 and continuing until age 61. Your health care provider may recommend screening at age 57 if you are at increased risk. You will have tests every 1-10 years, depending on your results and the type of screening test.  Prostate cancer screening. Recommendations will vary depending on your family history and other risks.  Diabetes screening. This is done by checking your blood sugar (glucose) after you have not eaten for  a while (fasting). You may have this done every 1-3 years.  Abdominal aortic aneurysm (AAA) screening. You may need this if you are a current or former smoker.  Sexually transmitted disease (STD) testing. Follow these instructions at home: Eating and drinking  Eat a diet that includes fresh fruits and vegetables, whole grains, lean  protein, and low-fat dairy products. Limit your intake of foods with high amounts of sugar, saturated fats, and salt.  Take vitamin and mineral supplements as recommended by your health care provider.  Do not drink alcohol if your health care provider tells you not to drink.  If you drink alcohol: ? Limit how much you have to 0-2 drinks a day. ? Be aware of how much alcohol is in your drink. In the [place] one drink equals one 12 oz bottle of beer (355 mL), one 5 oz glass of wine (148 mL), or one 1 oz glass of hard liquor (44 mL). Lifestyle  Take daily care of your teeth and gums.  Stay active. Exercise for at least 30 minutes on 5 or more days each week.  Do not use any products that contain nicotine or tobacco, such as cigarettes, e-cigarettes, and chewing tobacco. If you need help quitting, ask your health care provider.  If you are sexually active, practice safe sex. Use a condom or other form of protection to prevent STIs (sexually transmitted infections).  Talk with your health care provider about taking a low-dose aspirin or statin. What's next?  Visit your health care provider once a year for a well check visit.  Ask your health care provider how often you should have your eyes and teeth checked.  Stay up to date on all vaccines. This information is not intended to replace advice given to you by your health care provider. Make sure you discuss any questions you have with your health care provider. Document Released: 06/04/2015 Document Revised: 05/02/2018 Document Reviewed: 05/02/2018 Elsevier Patient Education  2020 [employer].

## 2019-04-07 NOTE — Progress Notes (Signed)
Presents today for [employer] Visit   Date of last exam: 10/21/2018  Interpreter used for this visit? NO  I connected with  Owens Loffler on 04/07/19 by telephone application and verified that I am speaking with the correct person using two identifiers.   I discussed the limitations of evaluation and management by telemedicine. The patient expressed understanding and agreed to proceed.   Patient Care Team: Wendie Agreste, MD as PCP - General (Family Medicine) Croitoru, Dani Gobble, MD as PCP - Cardiology (Cardiology) Kathie Rhodes, MD as Consulting Physician (Urology) Croitoru, Dani Gobble, MD as Consulting Physician (Cardiology)   Other items to address today:   Follow up scheduled Dr. Carlota Raspberry 12-9 Discussed Eye/Dental Patient is putting an appeal together for Medicare to try and get implants covered .  Based on medications that cause tooth decay. Discussed immunizations   Other Screening: Last screening for diabetes:6/102020 Last lipid screening: 03/13/2019  ADVANCE DIRECTIVES: Discussed: yes  On File: yes Materials Provided: no  Immunization status:  Immunization History  Administered Date(s) Administered  . Influenza Split 02/17/2015  . Influenza, High Dose Seasonal PF 02/15/2018  . Influenza, Quadrivalent, Recombinant, Inj, Pf 01/22/2019  . Influenza,inj,Quad PF,6+ Mos 02/11/2016, 02/08/2017  . Influenza-Unspecified 02/17/2015, 01/22/2019  . Pneumococcal Conjugate-13 02/24/2014  . Pneumococcal Polysaccharide-23 09/08/2015  . Tdap 09/08/2015  . Zoster 05/22/2013     Health Maintenance Due  Topic Date Due  . OPHTHALMOLOGY EXAM  11/01/2018  . FOOT EXAM  11/03/2018     Functional Status Survey: Is the patient deaf or have difficulty hearing?: No Does the patient have difficulty seeing, even when wearing glasses/contacts?: No Does the patient have difficulty concentrating, remembering, or making decisions?: No Does the patient have difficulty  walking or climbing stairs?: No Does the patient have difficulty dressing or bathing?: No Does the patient have difficulty doing errands alone such as visiting a doctor's office or shopping?: No   6CIT Screen 04/07/2019 05/03/2018 02/08/2017  What Year? 0 points 0 points 0 points  What month? 0 points 0 points 0 points  What time? 0 points 0 points 0 points  Count back from 20 0 points 0 points 0 points  Months in reverse 0 points 0 points 0 points  Repeat phrase 0 points 0 points 0 points  Total Score 0 0 0        Clinical Support from 04/07/2019 in [place] at [facility]  AUDIT-C Score  0       Home Environment:   Lives in one story home No trouble climbing stairs Yes Grab bars No scattered rugs Adequate lighting/no clutter   Patient is sole caregiver for elderly father Takes care of 74 year old granddaughter during the day.    Some days finds it overwhelming but is pushing through.   Medicare annual wellness visit, subsequent     Patient Active Problem List   Diagnosis Date Noted  . Other social stressor 07/27/2018  . Essential hypertension 07/27/2018  . Diabetes mellitus type 2 in obese ([place]) 07/27/2018  . Paroxysmal atrial fibrillation ([place]) 04/21/2016  . Diabetes mellitus ([place]) 04/21/2016  . OSA (obstructive sleep apnea) 04/21/2016  . Quality of life palliative care encounter 01/03/2016  . Bilateral carotid bruits without stenosis 01/02/2016  . Tremor, essential 09/22/2015  . Glaucoma, open angle 06/16/2015  . 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  . Pseudoaphakia 06/15/2015  . CD (contact dermatitis) 04/29/2014  . 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 Hematuria 01/05/2014  . Skin  lesion 01/05/2014  . Aortic valve stenosis, nonrheumatic 01/03/2014  . Hypertensive heart disease 01/03/2014  . Mixed hyperlipidemia 01/03/2014  . Mild obesity 01/03/2014  . Tobacco abuse 01/03/2014  . Chronic atrial fibrillation ([place]) 12/31/2013  . Long term  (current) use of anticoagulants 12/31/2013  . Cataract, nuclear 10/06/2013  . Dermatochalasis of eyelid 10/06/2013  . 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 Pre-diabetes 09/03/2013  . 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 Prostate cancer ([place]) 07/08/2013     Past Medical History:  Diagnosis Date  . Adenomatous colon polyp   . Alcoholism ([place])   . Anxiety   . Atrial fibrillation ([place])   . CAD (coronary artery disease)   . Cataract    removed left eye   . 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 Depression   . Diabetes mellitus without complication ([place])   . Diverticulosis   . Eye abnormality    Macular scarring R eye  . Glaucoma   . Heart murmur   . HLD (hyperlipidemia)   . Hypertension   . Leukemia ([place])    CLL  . Myocardial infarction ([place]) 2006  . Prostate cancer ([place]) 07/08/2013   [place] [facility]- getting Lupron shot every 6 months - traces in prostate and 2 lymphnodes left hip- non focused traces per pt   . Sleep apnea   . Substance abuse ([place])   . Tremor, essential 09/22/2015     Past Surgical History:  Procedure Laterality Date  . Arm Surgery Right    from door accident with glass  . CATARACT EXTRACTION W/ INTRAOCULAR LENS IMPLANT Left   . COLONOSCOPY    . 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 POLYPECTOMY       Family History  Problem Relation Age of Onset  . Colon cancer Mother   . Ovarian cancer Mother   . Uterine cancer Mother   . Hypertension Father   . Prostate cancer Father   . Colon polyps Neg Hx      Social History   Socioeconomic History  . Marital status: Divorced    Spouse name: Not on file  . Number of children: 2  . Years of education: 68  . Highest education level: Not on file  Occupational History  . Occupation: Retired Tour manager  . Financial resource strain: Not on  file  . Food insecurity    Worry: Not on file    Inability: Not on file  . Transportation needs    Medical: Not on file    Non-medical: Not on file  Tobacco Use  . Smoking status: Current Every Day Smoker    Packs/day: 0.50    Years: 50.00    Pack years: 25.00    Types: Cigarettes  . Smokeless tobacco: Never Used  Substance and Sexual Activity  . Alcohol use: No    Alcohol/week: 0.0 standard drinks  . Drug use: No  . Sexual activity: Not Currently  Lifestyle  . Physical activity    Days per week: Not on file    Minutes per session: Not on file  . Stress: Not on file  Relationships  . Social Herbalist on phone: Not on file    Gets together: Not on file    Attends religious service: Not on file    Active member of club or organization: Not  on file    Attends meetings of clubs or organizations: Not on file    Relationship status: Not on file  . Intimate partner violence    Fear of current or ex partner: Not on file    Emotionally abused: Not on file    Physically abused: Not on file    Forced sexual activity: Not on file  Other Topics Concern  . Not on file  Social History Narrative   Lives at home w/ his father   Divorced   Right-handed   Education: College   No caffeine     Allergies  Allergen Reactions  . Other Rash    Allergen: "Plants and bushes while doing yard work"     Prior to Admission medications   Medication Sig Start Date End Date Taking? Authorizing Provider  amLODipine (NORVASC) 10 MG tablet Take 1 tablet (10 mg total) by mouth daily. 03/13/19  Yes Croitoru, Mihai, MD  B Complex Vitamins (VITAMIN-B COMPLEX PO) Take by mouth.   Yes [provider]  BIOTIN PO Take by mouth.   Yes [provider]  buPROPion (WELLBUTRIN XL) 300 MG 24 hr tablet 1  qam 12/06/18  Yes Plovsky, Berneta Sages, MD  Calcium Carbonate-Vit D-Min (CALCIUM 1200 PO) Take 1,200 mg by mouth daily.   Yes [provider]  Docusate Calcium (STOOL  SOFTENER PO) Take 1 tablet as needed by mouth.   Yes [provider]  gabapentin (NEURONTIN) 100 MG capsule Take 1 capsule (100 mg total) by mouth daily. 12/06/18  Yes Plovsky, Berneta Sages, MD  ketoconazole (NIZORAL) 2 % shampoo Apply 1 application topically 2 (two) times a week. 06/18/17  Yes Wendie Agreste, MD  latanoprost (XALATAN) 0.005 % ophthalmic solution Once a day every second day 06/30/13  Yes [provider]  Leuprolide Acetate, 6 Month, (LUPRON) 45 MG injection Inject 45 mg into the muscle every 6 (six) months.   Yes [provider]  LYCOPENE PO Take 8 mg by mouth daily.   Yes [provider]  metFORMIN (GLUCOPHAGE) 500 MG tablet TAKE 1 TABLET BY MOUTH EVERY EVENING 10/31/18  Yes Gorsuch, Ni, MD  metoprolol (TOPROL-XL) 200 MG 24 hr tablet Take 0.5 tablets (100 mg total) by mouth 2 (two) times daily. Take with or immediately following a meal. 03/13/19  Yes Croitoru, Mihai, MD  Omega-3 Fatty Acids (FISH OIL) 1000 MG CAPS Take 1,000 mg by mouth daily.   Yes [provider]  rosuvastatin (CRESTOR) 20 MG tablet Take 1 tablet (20 mg total) by mouth daily. 07/26/18  Yes Croitoru, Mihai, MD  valsartan (DIOVAN) 320 MG tablet Take 1 tablet (320 mg total) by mouth daily. 03/13/19  Yes Croitoru, Mihai, MD  vitamin B-12 (CYANOCOBALAMIN) 500 MCG tablet Take 500 mcg by mouth daily.   Yes [provider]  vitamin C (ASCORBIC ACID) 500 MG tablet Take 500 mg by mouth daily.   Yes [provider]  warfarin (COUMADIN) 4 MG tablet TAKE 1/2 TO 1 TABLET DAILY AS DIRECTED BY COUMADIN CLINIC 03/13/19  Yes Croitoru, Mihai, MD  ezetimibe (ZETIA) 10 MG tablet Take 1 tablet (10 mg total) by mouth daily. 07/26/18 10/24/18  Croitoru, Dani Gobble, MD     Depression screen [facility] 2/9 04/07/2019 10/30/2018 05/03/2018 11/02/2017 06/18/2017  Decreased Interest 2 0 0 0 0  Down, Depressed, Hopeless 2 0 3 0 0  PHQ - 2 Score 4 0 3 0 0  Altered sleeping 0 - 0 - -  Tired, decreased  energy  0 - 3 - -  Change in appetite 0 - 0 - -  Feeling bad or failure about yourself  0 - 3 - -  Trouble concentrating 0 - 0 - -  Moving slowly or fidgety/restless 0 - 0 - -  Suicidal thoughts 0 - 0 - -  PHQ-9 Score 4 - 9 - -  Difficult doing work/chores Not difficult at all - Somewhat difficult - -  Some recent data might be hidden     Fall Risk  04/07/2019 04/07/2019 10/30/2018 05/03/2018 11/02/2017  Falls in the past year? - 1 0 0 No  Number falls in past yr: - 1 0 - -  Comment - fell off ladder cutting a branch landed on soft grass - - -  Injury with Fall? - 0 0 - -  Follow up Falls evaluation completed;Education provided Falls evaluation completed;Education provided Falls evaluation completed - -      PHYSICAL EXAM: BP (!) 146/56 Comment: taken previous visit  Ht 5\' 9"  (1.753 m)   Wt 229 lb (103.9 kg) Comment: taken from previous visit  BMI 33.82 kg/m    Wt Readings from Last 3 Encounters:  04/07/19 229 lb (103.9 kg)  03/13/19 229 lb (103.9 kg)  10/30/18 231 lb 3.2 oz (104.9 kg)       Education/Counseling provided regarding diet and exercise, prevention of chronic diseases, smoking/tobacco cessation, if applicable, and reviewed "Covered Medicare Preventive Services."

## 2019-04-10 ENCOUNTER — Ambulatory Visit (HOSPITAL_COMMUNITY): Payer: Medicare Other | Admitting: Psychiatry

## 2019-04-15 ENCOUNTER — Other Ambulatory Visit: Payer: Self-pay

## 2019-04-15 ENCOUNTER — Ambulatory Visit (INDEPENDENT_AMBULATORY_CARE_PROVIDER_SITE_OTHER): Payer: Medicare Other | Admitting: Licensed Clinical Social Worker

## 2019-04-15 ENCOUNTER — Encounter (HOSPITAL_COMMUNITY): Payer: Self-pay | Admitting: Licensed Clinical Social Worker

## 2019-04-15 DIAGNOSIS — F411 Generalized anxiety disorder: Secondary | ICD-10-CM | POA: Diagnosis not present

## 2019-04-15 DIAGNOSIS — F331 Major depressive disorder, recurrent, moderate: Secondary | ICD-10-CM | POA: Diagnosis not present

## 2019-04-15 NOTE — Progress Notes (Signed)
Virtual Visit via Video Note  I connected with Rodney Friedman on 04/15/19 at  9:00 AM EST by a video enabled telemedicine application and verified that I am speaking with the correct person using two identifiers.     I discussed the limitations of evaluation and management by telemedicine and the availability of in person appointments. The patient expressed understanding and agreed to proceed.   Type of Therapy: Individual Therapy  Treatment Goals addressed: Improve psychiatric symptoms, elevate mood (decreased irritability, increased enjoyment of activities), Improve unhelpful thought patterns, emotional regulation skills (reduce temper outburst), Interpersonal relationship skills  Interventions: Motivational Interviewing and Other: Grounding and Mindfulness techniques  Summary: Rodney Friedman is a 73y.o. male who presents with Major Depressive Disorder, moderate and Generalized Anxiety Disorder  Suicidal/Homicidal: No without intent/plan  Therapist Response:  Rodney Friedman met with clinician for individual therapy. Rodney Friedman discussed his psychiatric symptoms and current life events. Rodney Friedman sharedthattherehave been ongoing concerns about his granddaughter. He reports he continues to walk on eggshells around her, as she has been very dismissive and rude at times to him. Rodney Friedman reports he feels hopeless at this point in building a relationship with her. He also identified that he is very stressed with getting her logged in to the school system where she is currently enrolled virtually. Clinician processed ways to make this easier. Clinician also explored options for the school people to help get the log-ins corrected. Rodney Friedman again processed concerns about his teeth. Clinician encouraged him to reach out to Medicare and build a case as he sees fit, in terms of having them assist in payment for dental work due to the issues being caused by medication and cancer treatments.   Plan: Return again in 2  weeks.   I discussed the assessment and treatment plan with the patient. The patient was provided an opportunity to ask questions and all were answered. The patient agreed with the plan and demonstrated an understanding of the instructions.   The patient was advised to call back or seek an in-person evaluation if the symptoms worsen or if the condition fails to improve as anticipated.  I provided 45 minutes of non-face-to-face time during this encounter.   [name], LCSW

## 2019-04-20 ENCOUNTER — Other Ambulatory Visit (HOSPITAL_COMMUNITY): Payer: Self-pay | Admitting: Psychiatry

## 2019-04-25 ENCOUNTER — Ambulatory Visit (INDEPENDENT_AMBULATORY_CARE_PROVIDER_SITE_OTHER): Payer: Medicare Other | Admitting: Psychiatry

## 2019-04-25 ENCOUNTER — Other Ambulatory Visit: Payer: Self-pay

## 2019-04-25 DIAGNOSIS — I25709 Atherosclerosis of coronary artery bypass graft(s), unspecified, with unspecified angina pectoris: Secondary | ICD-10-CM

## 2019-04-25 DIAGNOSIS — F319 Bipolar disorder, unspecified: Secondary | ICD-10-CM

## 2019-04-25 DIAGNOSIS — G4700 Insomnia, unspecified: Secondary | ICD-10-CM | POA: Diagnosis not present

## 2019-04-25 DIAGNOSIS — F3342 Major depressive disorder, recurrent, in full remission: Secondary | ICD-10-CM | POA: Diagnosis not present

## 2019-04-25 MED ORDER — BUPROPION HCL ER (XL) 300 MG PO TB24: ORAL_TABLET | ORAL | 2 refills | Status: DC

## 2019-04-25 MED ORDER — GABAPENTIN 100 MG PO CAPS: ORAL_CAPSULE | ORAL | 1 refills | Status: DC

## 2019-04-25 NOTE — Progress Notes (Signed)
Psychiatric Initial Adult Assessment   Patient Identification: Chrstopher Dowtin MRN:  [id number] Date of Evaluation:  04/25/2019 Referral Source: Dr. Nyoka Cowden Chief Complaint: Need follow-up care   Visit Diagnosis: Bipolar disorder, substance use disorder Bipolar disorder  History of Present Illness:  Today the patient is doing fairly well.  His biggest stresses homeschooling his 71-year-old granddaughter for some reason does not seem to get along with him.  His son the father of the 14-year-old has to work all the time.  The granddaughter lives with his son and comes over to the patient's home during the day for home schooling.  He has been very frustrating with homeschooling.  There are many technical problems.  The patient has a good relationship with his father who lives with him who actually is doing fairly well.  Patient's health is fairly good.  His atrial for was under good control.  Amazingly the patient has [place] meetings in his home with social distancing and open window and only 6 people participate.  Today we discussed some clarity of how much Neurontin he should take.  Overall the patient is doing very well.  He is functioning well.  He denies chest pain shortness of breath or any neurological symptoms.  Is positive and optimistic.  He does well in therapy.  Substance Abuse History in the last 12 months:  Yes.    Consequences of Substance Abuse: NA  Past Medical History:  Past Medical History:  Diagnosis Date  . Adenomatous colon polyp   . Alcoholism ([place])   . Anxiety   . Atrial fibrillation ([place])   . CAD (coronary artery disease)   . Cataract    removed left eye   . 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 Depression   . Diabetes mellitus without complication ([place])   . Diverticulosis   . Eye abnormality    Macular scarring R eye  . Glaucoma   . Heart murmur   . HLD (hyperlipidemia)   . Hypertension   . Leukemia ([place])    CLL  . Myocardial infarction ([place])  2006  . Prostate cancer ([place]) 07/08/2013   [place]- getting Lupron shot every 6 months - traces in prostate and 2 lymphnodes left hip- non focused traces per pt   . Sleep apnea   . Substance abuse ([place])   . 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 Arm Surgery Right    from door accident with glass  . CATARACT EXTRACTION W/ INTRAOCULAR LENS IMPLANT Left   . COLONOSCOPY    . 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 POLYPECTOMY      Family Psychiatric History:   Family History:  Family History  Problem Relation Age of Onset  . Colon cancer Mother   . Ovarian cancer Mother   . Uterine cancer Mother   . Hypertension Father   . Prostate cancer Father   . Colon polyps Neg Hx     Social History:   Social History   Socioeconomic History  . Marital status: Divorced    Spouse name: Not on file  . Number of children: 2  . Years of education: 24  . Highest education level: Not on file  Occupational History  . Occupation: Retired Tour manager  . Financial resource strain: Not on file  . Food insecurity    Worry: Not on file    Inability: Not on file  . Transportation needs  Medical: Not on file    Non-medical: Not on file  Tobacco Use  . Smoking status: Current Every Day Smoker    Packs/day: 0.50    Years: 50.00    Pack years: 25.00    Types: Cigarettes  . Smokeless tobacco: Never Used  Substance and Sexual Activity  . Alcohol use: No    Alcohol/week: 0.0 standard drinks  . Drug use: No  . Sexual activity: Not Currently  Lifestyle  . Physical activity    Days per week: Not on file    Minutes per session: Not on file  . Stress: Not on file  Relationships  . Social Herbalist on phone: Not on file    Gets together: Not on file    Attends religious service: Not on file    Active member of club or organization: Not on file    Attends meetings of clubs or organizations: Not on file     Relationship status: Not on file  Other Topics Concern  . Not on file  Social History Narrative   Lives at home w/ his father   Divorced   Right-handed   Education: College   No caffeine    Additional Social History:   Allergies:   Allergies  Allergen Reactions  . Other Rash    Allergen: "Plants and bushes while doing yard work"    Metabolic Disorder Labs: Lab Results  Component Value Date   HGBA1C 7.3 (H) 10/30/2018   MPG 117 02/11/2016   MPG 128 (H) 03/22/2015   No results found for: PROLACTIN Lab Results  Component Value Date   CHOL 119 03/13/2019   TRIG 271 (H) 03/13/2019   HDL 29 (L) 03/13/2019   CHOLHDL 4.1 03/13/2019   VLDL 41 (H) 03/10/2016   LDLCALC 48 03/13/2019   LDLCALC 111 (H) 10/30/2018     Current Medications: Current Outpatient Medications  Medication Sig Dispense Refill  . amLODipine (NORVASC) 10 MG tablet Take 1 tablet (10 mg total) by mouth daily. 90 tablet 3  . B Complex Vitamins (VITAMIN-B COMPLEX PO) Take by mouth.    Marland Kitchen BIOTIN PO Take by mouth.    Marland Kitchen buPROPion (WELLBUTRIN XL) 300 MG 24 hr tablet 1  qam 90 tablet 2  . Calcium Carbonate-Vit D-Min (CALCIUM 1200 PO) Take 1,200 mg by mouth daily.    Mariane Baumgarten Calcium (STOOL SOFTENER PO) Take 1 tablet as needed by mouth.    . ezetimibe (ZETIA) 10 MG tablet Take 1 tablet (10 mg total) by mouth daily. 90 tablet 3  . gabapentin (NEURONTIN) 100 MG capsule 3  qhs 270 capsule 1  . ketoconazole (NIZORAL) 2 % shampoo Apply 1 application topically 2 (two) times a week. 120 mL 1  . latanoprost (XALATAN) 0.005 % ophthalmic solution Once a day every second day    . Leuprolide Acetate, 6 Month, (LUPRON) 45 MG injection Inject 45 mg into the muscle every 6 (six) months.    Marland Kitchen LYCOPENE PO Take 8 mg by mouth daily.    . metFORMIN (GLUCOPHAGE) 500 MG tablet TAKE 1 TABLET BY MOUTH EVERY EVENING 90 tablet 1  . metoprolol (TOPROL-XL) 200 MG 24 hr tablet Take 0.5 tablets (100 mg total) by mouth 2 (two) times daily.  Take with or immediately following a meal. 90 tablet 3  . Omega-3 Fatty Acids (FISH OIL) 1000 MG CAPS Take 1,000 mg by mouth daily.    . rosuvastatin (CRESTOR) 20 MG tablet Take 1 tablet (20  mg total) by mouth daily. 90 tablet 3  . valsartan (DIOVAN) 320 MG tablet Take 1 tablet (320 mg total) by mouth daily. 90 tablet 3  . vitamin B-12 (CYANOCOBALAMIN) 500 MCG tablet Take 500 mcg by mouth daily.    . vitamin C (ASCORBIC ACID) 500 MG tablet Take 500 mg by mouth daily.    Marland Kitchen warfarin (COUMADIN) 4 MG tablet TAKE 1/2 TO 1 TABLET DAILY AS DIRECTED BY COUMADIN CLINIC 90 tablet 1   Current Facility-Administered Medications  Medication Dose Route Frequency Provider Last Rate Last Dose  . 0.9 %  sodium chloride infusion  500 mL Intravenous Once Doran Stabler, MD        Neurologic: Headache: No Seizure: No Paresthesias:No  Musculoskeletal: Strength & Muscle Tone: within normal limits Gait & Station: normal Patient leans: Right  Psychiatric Specialty Exam: ROS  There were no vitals taken for this visit.There is no height or weight on file to calculate BMI.  General Appearance: Casual  Eye Contact:  Good  Speech:  Clear and Coherent  Volume:  Normal  Mood:  NA  Affect:  Appropriate  Thought Process:  Goal Directed  Orientation:  NA  Thought Content:  WDL  Suicidal Thoughts:  No  Homicidal Thoughts:  No  Memory:  Negative  Judgement:  Good  Insight:  Good  Psychomotor Activity:  Normal  Concentration:    Recall:  [place] of Knowledge:Good  Language: Good  Akathisia:  No  Handed:  Right  AIMS (if indicated):    Assets:  Desire for Improvement  ADL's:  Intact  Cognition: WNL  Sleep:      Treatment Plan Summary: 12/4/20209:50 AM   Today the patient is doing well.  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  He takes Wellbutrin 300 mg.  His second problem is that of insomnia.  Today we clarified that he should be taking Neurontin 100  mg.  But he can take 1 at night or 2 or even up to 3.  So he may be up to 300 mg of Neurontin.  We described the safety of this medicine.  His third problem is that of alcohol dependency.  He goes to AA on a regular basis.  Patient also engages in one-to-one therapy which I suspect helps his depression as well as his alcohol dependency.  Patient has good energy.  He is very compliant.  Return to see me again in 3 months for a 30-minute visit this was a telephone visit.

## 2019-04-28 ENCOUNTER — Ambulatory Visit (INDEPENDENT_AMBULATORY_CARE_PROVIDER_SITE_OTHER): Payer: Medicare Other | Admitting: Pharmacist

## 2019-04-28 ENCOUNTER — Other Ambulatory Visit: Payer: Self-pay

## 2019-04-28 DIAGNOSIS — Z7901 Long term (current) use of anticoagulants: Secondary | ICD-10-CM | POA: Diagnosis not present

## 2019-04-28 DIAGNOSIS — I482 Chronic atrial fibrillation, unspecified: Secondary | ICD-10-CM

## 2019-04-28 DIAGNOSIS — I480 Paroxysmal atrial fibrillation: Secondary | ICD-10-CM | POA: Diagnosis not present

## 2019-04-28 LAB — POCT INR: INR: 2.8 (ref 2.0–3.0)

## 2019-04-30 ENCOUNTER — Other Ambulatory Visit: Payer: Self-pay

## 2019-04-30 ENCOUNTER — Encounter: Payer: Self-pay | Admitting: Family Medicine

## 2019-04-30 ENCOUNTER — Ambulatory Visit (INDEPENDENT_AMBULATORY_CARE_PROVIDER_SITE_OTHER): Payer: Medicare Other | Admitting: Family Medicine

## 2019-04-30 VITALS — BP 139/64 | HR 61 | Temp 97.8°F | Wt 229.8 lb

## 2019-04-30 DIAGNOSIS — I25709 Atherosclerosis of coronary artery bypass graft(s), unspecified, with unspecified angina pectoris: Secondary | ICD-10-CM | POA: Diagnosis not present

## 2019-04-30 DIAGNOSIS — E785 Hyperlipidemia, unspecified: Secondary | ICD-10-CM

## 2019-04-30 DIAGNOSIS — E119 Type 2 diabetes mellitus without complications: Secondary | ICD-10-CM

## 2019-04-30 DIAGNOSIS — S80812A Abrasion, left lower leg, initial encounter: Secondary | ICD-10-CM | POA: Diagnosis not present

## 2019-04-30 DIAGNOSIS — C9110 Chronic lymphocytic leukemia of B-cell type not having achieved remission: Secondary | ICD-10-CM

## 2019-04-30 DIAGNOSIS — I10 Essential (primary) hypertension: Secondary | ICD-10-CM

## 2019-04-30 MED ORDER — METFORMIN HCL 500 MG PO TABS: 500.0000 mg | ORAL_TABLET | Freq: Every evening | ORAL | 1 refills | Status: DC

## 2019-04-30 NOTE — Progress Notes (Signed)
Subjective:  Patient ID: Rodney Friedman, male    DOB: 06/11/1945  Age: 73 y.o. MRN: [id number]  CC:  Chief Complaint  Patient presents with  . Medical Management of Chronic Issues    6 month f/u with other issues he will like to  discuss. Wanted to know when covid shot avalible, medicare coverage on dental/gum disease due to medication.  . Fall    patient fell off a ladder  2wk ago just scrapted his left lower leg. Open wound he just want to have looked at,    HPI Rodney Friedman presents for multiple concerns as above  He is followed by hematology for chronic lymphocytic leukemia, cardiology for history of CAD, aortic valve stenosis, paroxysmal atrial fibrillation and chronic anticoagulation.  Followed by psychiatry for depression/bipolar disorder, urology for prostate cancer Would like me to check CBC - WBC hover around 17 (15-18) Has appt with hematology about once per year.   Leg wound: Fall from ladder 2 weeks ago, abrasion/scrape of left lower leg. Soap and water, neosporin once per day. Occasional soreness. Walking ok. Healing.   Dental/gum disease concern: Went to dentist. Told had dry mouth related to meds. Accelerates tooth/gum issues. Plans on trying to get medicare to cover dental procedures.  Removing teeth, multiple cavities and crown. Had deep scaling. Plans ot discuss case with medicare. May need form reviewed by me.   Diabetes: Slight increase in A1c in June. Has tried to cut back on sweets, exercise with raking leaves. 80 barrels of leaves.  Still on metformin 500 mg daily.  No new side effects.  Takes ARB, Zetia and Crestor. Wt Readings from Last 3 Encounters:  04/30/19 229 lb 12.8 oz (104.2 kg)  04/07/19 229 lb (103.9 kg)  03/13/19 229 lb (103.9 kg)     Microalbumin: nl ratio 11/01/18.  Optho, foot exam, pneumovax:  Optho: appt next week.   Lab Results  Component Value Date   HGBA1C 7.3 (H) 10/30/2018   HGBA1C 6.3 (H) 05/03/2018   HGBA1C 6.3 (H)  11/02/2017   Lab Results  Component Value Date   MICROALBUR 1.9 10/08/2014   LDLCALC 48 03/13/2019   CREATININE 1.02 10/30/2018   Hyperlipidemia: Takes Zetia and Crestor.  CAD history as above.  Recent labs obtained. No new myalgias with combo.  Lab Results  Component Value Date   CHOL 119 03/13/2019   HDL 29 (L) 03/13/2019   LDLCALC 48 03/13/2019   TRIG 271 (H) 03/13/2019   CHOLHDL 4.1 03/13/2019   Lab Results  Component Value Date   ALT 40 10/30/2018   AST 29 10/30/2018   ALKPHOS 54 10/30/2018   BILITOT <0.2 10/30/2018    Hypertension: Losartan 320 mg daily, Toprol 200 mg daily, amlodipine 10 mg daily. Home readings: BP Readings from Last 3 Encounters:  04/30/19 139/64  04/07/19 (!) 146/56  03/13/19 (!) 146/56   Lab Results  Component Value Date   CREATININE 1.02 10/30/2018     History Patient Active Problem List   Diagnosis Date Noted  . Other social stressor 07/27/2018  . Essential hypertension 07/27/2018  . Diabetes mellitus type 2 in obese ([place]) 07/27/2018  . Paroxysmal atrial fibrillation ([place]) 04/21/2016  . Diabetes mellitus ([place]) 04/21/2016  . OSA (obstructive sleep apnea) 04/21/2016  . Quality of life palliative care encounter 01/03/2016  . Bilateral carotid bruits without stenosis 01/02/2016  . Tremor, essential 09/22/2015  . Glaucoma, open angle 06/16/2015  . 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  . Pseudoaphakia 06/15/2015  .  CD (contact dermatitis) 04/29/2014  . 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 Hematuria 01/05/2014  . Skin lesion 01/05/2014  . Aortic valve stenosis, nonrheumatic 01/03/2014  . Hypertensive heart disease 01/03/2014  . Mixed hyperlipidemia 01/03/2014  . Mild obesity 01/03/2014  . Tobacco abuse 01/03/2014  . Chronic atrial fibrillation ([place]) 12/31/2013  . Long term (current) use of anticoagulants 12/31/2013  . Cataract, nuclear 10/06/2013  . Dermatochalasis of eyelid 10/06/2013  . Chorioretinal scar,  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  . Coronary artery disease of native artery of native heart with stable angina pectoris ([place]) 09/03/2013  . Pre-diabetes 09/03/2013  . CLL (chronic lymphocytic leukemia) ([place]) 07/08/2013  . Prostate cancer ([place]) 07/08/2013   Past Medical History:  Diagnosis Date  . Adenomatous colon polyp   . Alcoholism ([place])   . Anxiety   . Atrial fibrillation ([place])   . CAD (coronary artery disease)   . Cataract    removed left eye   . CLL (chronic lymphocytic leukemia) ([place]) 07/08/2013  . Depression   . Diabetes mellitus without complication ([place])   . Diverticulosis   . Eye abnormality    Macular scarring R eye  . Glaucoma   . Heart murmur   . HLD (hyperlipidemia)   . Hypertension   . Leukemia ([place])    CLL  . Myocardial infarction ([place]) 2006  . Prostate cancer ([place]) 07/08/2013   [facility] at [facility]- getting Lupron shot every 6 months - traces in prostate and 2 lymphnodes left hip- non focused traces per pt   . Sleep apnea   . Substance abuse ([place])   . 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 Arm Surgery Right    from door accident with glass  . CATARACT EXTRACTION W/ INTRAOCULAR LENS IMPLANT Left   . COLONOSCOPY    . 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 POLYPECTOMY     Allergies  Allergen Reactions  . Other Rash    Allergen: "Plants and bushes while doing yard work"   Prior to Admission medications   Medication Sig Start Date End Date Taking? Authorizing Provider  amLODipine (NORVASC) 10 MG tablet Take 1 tablet (10 mg total) by mouth daily. 03/13/19  Yes Croitoru, Mihai, MD  B Complex Vitamins (VITAMIN-B COMPLEX PO) Take by mouth.   Yes [provider]  BIOTIN PO Take by mouth.   Yes [provider]  buPROPion (WELLBUTRIN XL) 300 MG 24 hr tablet 1  qam 04/25/19  Yes Plovsky, Berneta Sages, MD  Calcium Carbonate-Vit D-Min (CALCIUM 1200 PO) Take 1,200 mg by mouth daily.   Yes [provider]   Docusate Calcium (STOOL SOFTENER PO) Take 1 tablet as needed by mouth.   Yes [provider]  gabapentin (NEURONTIN) 100 MG capsule 3  qhs 04/25/19  Yes Plovsky, Berneta Sages, MD  ketoconazole (NIZORAL) 2 % shampoo Apply 1 application topically 2 (two) times a week. 06/18/17  Yes Wendie Agreste, MD  latanoprost (XALATAN) 0.005 % ophthalmic solution Once a day every second day 06/30/13  Yes [provider]  Leuprolide Acetate, 6 Month, (LUPRON) 45 MG injection Inject 45 mg into the muscle every 6 (six) months.   Yes [provider]  LYCOPENE PO Take 8 mg by mouth daily.   Yes [provider]  metFORMIN (GLUCOPHAGE) 500 MG tablet TAKE 1 TABLET BY MOUTH EVERY EVENING 10/31/18  Yes Gorsuch, Ni, MD  metoprolol (TOPROL-XL) 200 MG 24 hr tablet Take 0.5 tablets (100 mg  total) by mouth 2 (two) times daily. Take with or immediately following a meal. 03/13/19  Yes Croitoru, Mihai, MD  Omega-3 Fatty Acids (FISH OIL) 1000 MG CAPS Take 1,000 mg by mouth daily.   Yes [provider]  rosuvastatin (CRESTOR) 20 MG tablet Take 1 tablet (20 mg total) by mouth daily. 07/26/18  Yes Croitoru, Mihai, MD  valsartan (DIOVAN) 320 MG tablet Take 1 tablet (320 mg total) by mouth daily. 03/13/19  Yes Croitoru, Mihai, MD  vitamin B-12 (CYANOCOBALAMIN) 500 MCG tablet Take 500 mcg by mouth daily.   Yes [provider]  vitamin C (ASCORBIC ACID) 500 MG tablet Take 500 mg by mouth daily.   Yes [provider]  warfarin (COUMADIN) 4 MG tablet TAKE 1/2 TO 1 TABLET DAILY AS DIRECTED BY COUMADIN CLINIC 03/13/19  Yes Croitoru, Mihai, MD  ezetimibe (ZETIA) 10 MG tablet Take 1 tablet (10 mg total) by mouth daily. 07/26/18 10/24/18  Croitoru, Dani Gobble, MD   Social History   Socioeconomic History  . Marital status: Divorced    Spouse name: Not on file  . Number of children: 2  . Years of education: 66  . Highest education level: Not on file  Occupational History  . Occupation: Retired  Tour manager  . Financial resource strain: Not on file  . Food insecurity    Worry: Not on file    Inability: Not on file  . Transportation needs    Medical: Not on file    Non-medical: Not on file  Tobacco Use  . Smoking status: Current Every Day Smoker    Packs/day: 0.50    Years: 50.00    Pack years: 25.00    Types: Cigarettes  . Smokeless tobacco: Never Used  Substance and Sexual Activity  . Alcohol use: No    Alcohol/week: 0.0 standard drinks  . Drug use: No  . Sexual activity: Not Currently  Lifestyle  . Physical activity    Days per week: Not on file    Minutes per session: Not on file  . Stress: Not on file  Relationships  . Social Herbalist on phone: Not on file    Gets together: Not on file    Attends religious service: Not on file    Active member of club or organization: Not on file    Attends meetings of clubs or organizations: Not on file    Relationship status: Not on file  . Intimate partner violence    Fear of current or ex partner: Not on file    Emotionally abused: Not on file    Physically abused: Not on file    Forced sexual activity: Not on file  Other Topics Concern  . Not on file  Social History Narrative   Lives at home w/ his father   Divorced   Right-handed   Education: College   No caffeine    Review of Systems  Constitutional: Negative for fatigue and unexpected weight change.  Eyes: Negative for visual disturbance.  Respiratory: Negative for cough, chest tightness and shortness of breath.   Cardiovascular: Negative for chest pain, palpitations and leg swelling.  Gastrointestinal: Negative for abdominal pain and blood in stool.  Neurological: Negative for dizziness, light-headedness and headaches.     Objective:   Vitals:   04/30/19 0916  BP: 139/64  Pulse: 61  Temp: 97.8 F (36.6 C)  TempSrc: Temporal  SpO2: 98%  Weight: 229 lb 12.8 oz (104.2 kg)  Physical Exam Vitals signs reviewed.   Constitutional:      Appearance: He is well-developed.  HENT:     Head: Normocephalic and atraumatic.  Eyes:     Pupils: Pupils are equal, round, and reactive to light.  Neck:     Vascular: No carotid bruit or JVD.  Cardiovascular:     Rate and Rhythm: Normal rate and regular rhythm.     Heart sounds: Normal heart sounds. No murmur.  Pulmonary:     Effort: Pulmonary effort is normal.     Breath sounds: Normal breath sounds. No rales.  Skin:    General: Skin is warm and dry.       Neurological:     Mental Status: He is alert and oriented to person, place, and time.        Assessment & Plan:  Rodney Friedman is a 73 y.o. male . Type 2 diabetes mellitus without complication, without long-term current use of insulin (HCC) - Plan: Comprehensive metabolic panel, Hemoglobin A1c, HM Diabetes Foot Exam, metFORMIN (GLUCOPHAGE) 500 MG tablet  - tolerating metformin, continue same - labs pending above.   - dental issues thought to be due in part to side effects from chronic meds. Will review form if needed - follow up with dentist as planned.   Essential hypertension - Plan: Comprehensive metabolic panel  -  Stable, tolerating current regimen. Medications refilled. Labs pending as above.   Hyperlipidemia, unspecified hyperlipidemia type - Plan: Comprehensive metabolic panel  - tolerating statin and zetia combo.   CLL (chronic lymphocytic leukemia) ([place]) - Plan: CBC with Differential  - check cbc, follow up with hematology as scheduled.   Abrasion of anterior left lower leg, initial encounter  - healing without signs of infection. Continued symptomatic care discussed. rtc precautions.   Meds ordered this encounter  Medications  . metFORMIN (GLUCOPHAGE) 500 MG tablet    Sig: Take 1 tablet (500 mg total) by mouth every evening.    Dispense:  90 tablet    Refill:  1   Patient Instructions   Continue soap and water cleaning of abrasion, polysporin ok as it heals. Return to the  clinic or go to the nearest emergency room if any of your symptoms worsen or new symptoms occur.  Continue to discuss dental concerns with dentist. Let me know if I need to review a form.   CDC.gov or [place] website may also give you some information regarding vaccine and when it will be available.  No change in medications for now, follow-up in 6 months.   Abrasion  An abrasion is a cut or a scrape on the outer surface of the skin. An abrasion does not go through all the layers of the skin. It is important to care for your abrasion properly to prevent infection. What are the causes? This condition is caused by falling on or gliding across the ground or another surface. When your skin rubs on something, the outer and inner layers of skin may rub off. What are the signs or symptoms? The main symptom of this condition is a cut or a scrape. The scrape may be bleeding, or it may appear red or pink. If the abrasion was caused by a fall, there may be a bruise under the cut or scrape. How is this diagnosed? An abrasion is diagnosed with a physical exam. How is this treated? Treatment for this condition depends on how large and deep the abrasion is.  In most cases:  Your abrasion will be cleaned with water and mild soap. This is done to remove any dirt or debris (such as particles of glass or rock) that may be stuck in the wound.  An antibiotic ointment may be applied to the abrasion to help prevent infection.  A bandage (dressing) may be placed on the abrasion to keep it clean. You may also need a tetanus shot. Follow these instructions at home: Medicines  Take or apply over-the-counter and prescription medicines only as told by your health care provider.  If you were prescribed an antibiotic medicine, apply it as told by your health care provider. Wound care  Clean the wound 2-3 times a day, or as directed by your health care provider. To do  this, wash the wound with mild soap and water, rinse off the soap, and pat the wound dry with a clean towel. Do not rub the wound.  Keep the dressing clean and dry as told by your health care provider.  There are many different ways to close and cover a wound. Follow instructions from your health care provider about: ? Caring for your wound. ? Changing and removing your dressing. You may have to change your dressing one or more times a day, or as directed by your health care provider.  Check your wound every day for signs of infection. Check for: ? Redness, particularly a red streak that spreads out from the wound. ? Swelling or increased pain. ? Warmth. ? Fluid, pus, or a bad smell.  If directed, put ice on the injured area to reduce pain and swelling: ? Put ice in a plastic bag. ? Place a towel between your skin and the bag. ? Leave the ice on for 20 minutes, 2-3 times a day. General instructions  Do not take baths, swim, or use a hot tub until your health care provider says it is okay to do so.  If possible, raise (elevate) the injured area above the level of your heart while you are sitting or lying down. This will reduce pain and swelling.  Keep all follow-up visits as directed by your health care provider. This is important. Contact a health care provider if:  You received a tetanus shot, and you have swelling, severe pain, redness, or bleeding at the injection site.  Your pain is not controlled with medicine.  You have redness, swelling, or more pain at the site of your wound. Get help right away if:  You have a red streak spreading away from your wound.  You have a fever.  You have fluid, blood, or pus coming from your wound.  You notice a bad smell coming from your wound or your dressing. Summary  An abrasion is a cut or a scrape on the outer surface of the skin. An abrasion does not go through all the layers of the skin.  Care for your abrasion properly to  prevent infection.  Clean the wound with mild soap and water 2-3 times a day. Follow instructions from your health care provider about taking medicines and changing your bandage (dressing).  Contact your health care provider if you have redness, swelling or more pain in the wound area.  Get help right away if you have a fever or if you have fluid, blood, pus, a bad smell, or a red streak coming from the wound. This information is not intended to replace advice given to you by your health care provider. Make sure you discuss any questions  you have with your health care provider. Document Released: 02/15/2005 Document Revised: 04/20/2017 Document Reviewed: 12/20/2016 Elsevier Patient Education  [employer].    If you have lab work done today you will be contacted with your lab results within the next 2 weeks.  If you have not heard from [place] then please contact us. The fastest way to get your results is to register for My Chart.   IF you received an x-ray today, you will receive an invoice from [facility]. Please contact [facility] at [phone] with questions or concerns regarding your invoice.   IF you received labwork today, you will receive an invoice from [facility]. Please contact [facility] at [phone] with questions or concerns regarding your invoice.   Our billing staff will not be able to assist you with questions regarding bills from these companies.  You will be contacted with the lab results as soon as they are available. The fastest way to get your results is to activate your My Chart account. Instructions are located on the last page of this paperwork. If you have not heard from [place] regarding the results in 2 weeks, please contact this office.          Signed, [name], MD [facility]

## 2019-04-30 NOTE — Patient Instructions (Addendum)
Continue soap and water cleaning of abrasion, polysporin ok as it heals. Return to the clinic or go to the nearest emergency room if any of your symptoms worsen or new symptoms occur.  Continue to discuss dental concerns with dentist. Let me know if I need to review a form.   CDC.gov or [place] website may also give you some information regarding vaccine and when it will be available.  No change in medications for now, follow-up in 6 months.   Abrasion  An abrasion is a cut or a scrape on the outer surface of the skin. An abrasion does not go through all the layers of the skin. It is important to care for your abrasion properly to prevent infection. What are the causes? This condition is caused by falling on or gliding across the ground or another surface. When your skin rubs on something, the outer and inner layers of skin may rub off. What are the signs or symptoms? The main symptom of this condition is a cut or a scrape. The scrape may be bleeding, or it may appear red or pink. If the abrasion was caused by a fall, there may be a bruise under the cut or scrape. How is this diagnosed? An abrasion is diagnosed with a physical exam. How is this treated? Treatment for this condition depends on how large and deep the abrasion is. In most cases:  Your abrasion will be cleaned with water and mild soap. This is done to remove any dirt or debris (such as particles of glass or rock) that may be stuck in the wound.  An antibiotic ointment may be applied to the abrasion to help prevent infection.  A bandage (dressing) may be placed on the abrasion to keep it clean. You may also need a tetanus shot. Follow these instructions at home: Medicines  Take or apply over-the-counter and prescription medicines only as told by your health care provider.  If you were prescribed an antibiotic medicine, apply it as told by your health care provider. Wound  care  Clean the wound 2-3 times a day, or as directed by your health care provider. To do this, wash the wound with mild soap and water, rinse off the soap, and pat the wound dry with a clean towel. Do not rub the wound.  Keep the dressing clean and dry as told by your health care provider.  There are many different ways to close and cover a wound. Follow instructions from your health care provider about: ? Caring for your wound. ? Changing and removing your dressing. You may have to change your dressing one or more times a day, or as directed by your health care provider.  Check your wound every day for signs of infection. Check for: ? Redness, particularly a red streak that spreads out from the wound. ? Swelling or increased pain. ? Warmth. ? Fluid, pus, or a bad smell.  If directed, put ice on the injured area to reduce pain and swelling: ? Put ice in a plastic bag. ? Place a towel between your skin and the bag. ? Leave the ice on for 20 minutes, 2-3 times a day. General instructions  Do not take baths, swim, or use a hot tub until your health care provider says it is okay to do so.  If possible, raise (elevate) the injured area above the level of your heart while you are sitting or lying down. This will reduce pain  and swelling.  Keep all follow-up visits as directed by your health care provider. This is important. Contact a health care provider if:  You received a tetanus shot, and you have swelling, severe pain, redness, or bleeding at the injection site.  Your pain is not controlled with medicine.  You have redness, swelling, or more pain at the site of your wound. Get help right away if:  You have a red streak spreading away from your wound.  You have a fever.  You have fluid, blood, or pus coming from your wound.  You notice a bad smell coming from your wound or your dressing. Summary  An abrasion is a cut or a scrape on the outer surface of the skin. An abrasion  does not go through all the layers of the skin.  Care for your abrasion properly to prevent infection.  Clean the wound with mild soap and water 2-3 times a day. Follow instructions from your health care provider about taking medicines and changing your bandage (dressing).  Contact your health care provider if you have redness, swelling or more pain in the wound area.  Get help right away if you have a fever or if you have fluid, blood, pus, a bad smell, or a red streak coming from the wound. This information is not intended to replace advice given to you by your health care provider. Make sure you discuss any questions you have with your health care provider. Document Released: 02/15/2005 Document Revised: 04/20/2017 Document Reviewed: 12/20/2016 Elsevier Patient Education  [employer].    If you have lab work done today you will be contacted with your lab results within the next 2 weeks.  If you have not heard from [place] then please contact us. The fastest way to get your results is to register for My Chart.   IF you received an x-ray today, you will receive an invoice from [facility]. Please contact [facility] at [phone] with questions or concerns regarding your invoice.   IF you received labwork today, you will receive an invoice from [facility]. Please contact [facility] at [phone] with questions or concerns regarding your invoice.   Our billing staff will not be able to assist you with questions regarding bills from these companies.  You will be contacted with the lab results as soon as they are available. The fastest way to get your results is to activate your My Chart account. Instructions are located on the last page of this paperwork. If you have not heard from [place] regarding the results in 2 weeks, please contact this office.

## 2019-05-01 ENCOUNTER — Encounter: Payer: Self-pay | Admitting: Family Medicine

## 2019-05-01 LAB — COMPREHENSIVE METABOLIC PANEL
ALT: 62 IU/L — ABNORMAL HIGH (ref 0–44)
AST: 48 IU/L — ABNORMAL HIGH (ref 0–40)
Albumin/Globulin Ratio: 2 (ref 1.2–2.2)
Albumin: 4.4 g/dL (ref 3.7–4.7)
Alkaline Phosphatase: 55 IU/L (ref 39–117)
BUN/Creatinine Ratio: 14 (ref 10–24)
BUN: 12 mg/dL (ref 8–27)
Bilirubin Total: 0.3 mg/dL (ref 0.0–1.2)
CO2: 21 mmol/L (ref 20–29)
Calcium: 9.6 mg/dL (ref 8.6–10.2)
Chloride: 107 mmol/L — ABNORMAL HIGH (ref 96–106)
Creatinine, Ser: 0.88 mg/dL (ref 0.76–1.27)
GFR calc Af Amer: 100 mL/min/{1.73_m2} (ref >59)
GFR calc non Af Amer: 86 mL/min/{1.73_m2} (ref >59)
Globulin, Total: 2.2 g/dL (ref 1.5–4.5)
Glucose: 114 mg/dL — ABNORMAL HIGH (ref 65–99)
Potassium: 4.5 mmol/L (ref 3.5–5.2)
Sodium: 143 mmol/L (ref 134–144)
Total Protein: 6.6 g/dL (ref 6.0–8.5)

## 2019-05-01 LAB — CBC WITH DIFFERENTIAL/PLATELET
Basophils Absolute: 0.1 10*3/uL (ref 0.0–0.2)
Basos: 1 %
EOS (ABSOLUTE): 0.2 10*3/uL (ref 0.0–0.4)
Eos: 2 %
Hematocrit: 42.6 % (ref 37.5–51.0)
Hemoglobin: 14.2 g/dL (ref 13.0–17.7)
Immature Grans (Abs): 0 10*3/uL (ref 0.0–0.1)
Immature Granulocytes: 0 %
Lymphocytes Absolute: 9.5 10*3/uL — ABNORMAL HIGH (ref 0.7–3.1)
Lymphs: 61 %
MCH: 30.7 pg (ref 26.6–33.0)
MCHC: 33.3 g/dL (ref 31.5–35.7)
MCV: 92 fL (ref 79–97)
Monocytes Absolute: 1 10*3/uL — ABNORMAL HIGH (ref 0.1–0.9)
Monocytes: 6 %
Neutrophils Absolute: 4.6 10*3/uL (ref 1.4–7.0)
Neutrophils: 30 %
Platelets: 216 10*3/uL (ref 150–450)
RBC: 4.62 x10E6/uL (ref 4.14–5.80)
RDW: 13.6 % (ref 11.6–15.4)
WBC: 15.5 10*3/uL — ABNORMAL HIGH (ref 3.4–10.8)

## 2019-05-01 LAB — HEMOGLOBIN A1C
Est. average glucose Bld gHb Est-mCnc: 151 mg/dL
Hgb A1c MFr Bld: 6.9 % — ABNORMAL HIGH (ref 4.8–5.6)

## 2019-05-02 ENCOUNTER — Encounter: Payer: Self-pay | Admitting: Cardiovascular Disease

## 2019-05-14 ENCOUNTER — Encounter (HOSPITAL_COMMUNITY): Payer: Self-pay | Admitting: Licensed Clinical Social Worker

## 2019-05-14 ENCOUNTER — Ambulatory Visit (INDEPENDENT_AMBULATORY_CARE_PROVIDER_SITE_OTHER): Payer: Medicare Other | Admitting: Licensed Clinical Social Worker

## 2019-05-14 ENCOUNTER — Other Ambulatory Visit: Payer: Self-pay

## 2019-05-14 DIAGNOSIS — F411 Generalized anxiety disorder: Secondary | ICD-10-CM

## 2019-05-14 DIAGNOSIS — F331 Major depressive disorder, recurrent, moderate: Secondary | ICD-10-CM | POA: Diagnosis not present

## 2019-05-14 NOTE — Progress Notes (Signed)
Virtual Visit via Telephone Note  I connected with Rodney Friedman on 05/14/19 at  9:00 AM EST by telephone and verified that I am speaking with the correct person using two identifiers.     I discussed the limitations, risks, security and privacy concerns of performing an evaluation and management service by telephone and the availability of in person appointments. I also discussed with the patient that there may be a patient responsible charge related to this service. The patient expressed understanding and agreed to proceed.  Type of Therapy: Individual Therapy  Treatment Goals addressed: Improve psychiatric symptoms, elevate mood (decreased irritability, increased enjoyment of activities), Improve unhelpful thought patterns, emotional regulation skills (reduce temper outburst), Interpersonal relationship skills  Interventions: Motivational Interviewing and Other: Grounding and Mindfulness techniques  Summary: Rodney Friedman is a 73y.o. male who presents with Major Depressive Disorder, moderate and Generalized Anxiety Disorder  Suicidal/Homicidal: No without intent/plan  Therapist Response:  [name] met with clinician for individual therapy. Suede discussed his psychiatric symptoms and current life events. Rajinder sharedthattherehavebeen ongoing concerns about his granddaughter. He reports his relationship with her has been null, even though she continues to come over and do her school work at his house daily. He reports he is applying the 12 steps to his relationship with her and he feels powerless and somewhat depressed. Clinician explored options for re-engaging her in play or art, but he reports that there is no use and he has decided to just let it go. Nakeem questioned about services that offer free or reduced cost for internet. Clinician researched in session and found a program for Rodney Friedman to look into. Clinician discussed Rodney Friedman's coping and provided time and space for him to process  his emotions. He reports increased physical manifestations of depression, including lethargy, fatigue, and some aches and pains. He reported no plans for Christmas, no decorations, and no real gifts. He does report feeling somewhat hopeful about his daughter, who is coming up on 2 years sober. Clinician explored Rodney Friedman's involvement in his [employer] meetings. He reported they have taken a break for a little while, but he will likely restart in the new year. He also reports maintaining connection online with [facility] group.   Plan: Return again in 2 weeks.  Diagnosis: Axis I: Major depressive disorder, moderate and Generalized Anxiety Disorder   I discussed the assessment and treatment plan with the patient. The patient was provided an opportunity to ask questions and all were answered. The patient agreed with the plan and demonstrated an understanding of the instructions.   The patient was advised to call back or seek an in-person evaluation if the symptoms worsen or if the condition fails to improve as anticipated.  I provided 45 minutes of non-face-to-face time during this encounter.   [name], LCSW

## 2019-05-17 ENCOUNTER — Other Ambulatory Visit: Payer: Self-pay | Admitting: Family Medicine

## 2019-05-17 DIAGNOSIS — I10 Essential (primary) hypertension: Secondary | ICD-10-CM

## 2019-05-17 NOTE — Telephone Encounter (Signed)
Forwarding medication refill request to the clinical pool for review. 

## 2019-05-20 ENCOUNTER — Encounter: Payer: Self-pay | Admitting: Emergency Medicine

## 2019-05-28 ENCOUNTER — Ambulatory Visit (INDEPENDENT_AMBULATORY_CARE_PROVIDER_SITE_OTHER): Payer: Medicare Other | Admitting: Licensed Clinical Social Worker

## 2019-05-28 ENCOUNTER — Encounter (HOSPITAL_COMMUNITY): Payer: Self-pay | Admitting: Licensed Clinical Social Worker

## 2019-05-28 ENCOUNTER — Other Ambulatory Visit: Payer: Self-pay

## 2019-05-28 DIAGNOSIS — F411 Generalized anxiety disorder: Secondary | ICD-10-CM | POA: Diagnosis not present

## 2019-05-28 DIAGNOSIS — F331 Major depressive disorder, recurrent, moderate: Secondary | ICD-10-CM | POA: Diagnosis not present

## 2019-05-28 NOTE — Progress Notes (Signed)
Virtual Visit via Video Note  I connected with Rodney Friedman on 05/28/19 at  9:00 AM EST by a video enabled telemedicine application and verified that I am speaking with the correct person using two identifiers.     I discussed the limitations of evaluation and management by telemedicine and the availability of in person appointments. The patient expressed understanding and agreed to proceed.   Type of Therapy: Individual Therapy  Treatment Goals addressed: Improve psychiatric symptoms, elevate mood (decreased irritability, increased enjoyment of activities), Improve unhelpful thought patterns, emotional regulation skills (reduce temper outburst), Interpersonal relationship skills  Interventions: Motivational Interviewing and Other: Grounding and Mindfulness techniques  Summary: Rodney Friedman is a 74y.o. male who presents with Major Depressive Disorder, moderate and Generalized Anxiety Disorder  Suicidal/Homicidal: No without intent/plan  Therapist Response:  [name] met with clinician for individual therapy. Rodney Friedman discussed his psychiatric symptoms and current life events. Rodney Friedman sharedthat he has been depressed and nervous about upcoming IRS appeal hearing. Clinician explored expectations and supports he has in place in order to make his case to reduce his fines. Clinician processed thoughts and feelings using MI OARS. Rodney Friedman also reports stress over his dental work and how to finance his teeth. He also reports that due to having some teeth removed, he is unable to chew some foods, which upsets him. Clinician normalized thoughts and feelings, processing the challenges with coping with aging.  Rodney Friedman identified ongoing issues with granddaughter, who came in to the session at the end. Clinician spoke with granddaughter and explored some things they could do together, such as play [employer] or draw. Clinician offered supports to assist with that relationship and encouraged Rodney Friedman to speak with son  about getting granddaughter into counseling to provide coping skills and someone to understand what she is going through.    Plan: Return again in 2 weeks.  Diagnosis: Axis I: Major depressive disorder, moderate and Generalized Anxiety Disorder   I discussed the assessment and treatment plan with the patient. The patient was provided an opportunity to ask questions and all were answered. The patient agreed with the plan and demonstrated an understanding of the instructions.   The patient was advised to call back or seek an in-person evaluation if the symptoms worsen or if the condition fails to improve as anticipated.  I provided 45 minutes of non-face-to-face time during this encounter.   [name], LCSW

## 2019-06-09 ENCOUNTER — Ambulatory Visit (INDEPENDENT_AMBULATORY_CARE_PROVIDER_SITE_OTHER): Payer: Medicare Other | Admitting: Pharmacist

## 2019-06-09 ENCOUNTER — Other Ambulatory Visit: Payer: Self-pay

## 2019-06-09 DIAGNOSIS — I25709 Atherosclerosis of coronary artery bypass graft(s), unspecified, with unspecified angina pectoris: Secondary | ICD-10-CM

## 2019-06-09 DIAGNOSIS — I480 Paroxysmal atrial fibrillation: Secondary | ICD-10-CM | POA: Diagnosis not present

## 2019-06-09 DIAGNOSIS — Z7901 Long term (current) use of anticoagulants: Secondary | ICD-10-CM

## 2019-06-09 DIAGNOSIS — I482 Chronic atrial fibrillation, unspecified: Secondary | ICD-10-CM

## 2019-06-09 DIAGNOSIS — E782 Mixed hyperlipidemia: Secondary | ICD-10-CM

## 2019-06-09 LAB — POCT INR: INR: 2.9 (ref 2.0–3.0)

## 2019-06-09 MED ORDER — ROSUVASTATIN CALCIUM 20 MG PO TABS: 20.0000 mg | ORAL_TABLET | Freq: Every day | ORAL | 3 refills | Status: DC

## 2019-06-09 MED ORDER — WARFARIN SODIUM 4 MG PO TABS: ORAL_TABLET | ORAL | 1 refills | Status: DC

## 2019-06-11 ENCOUNTER — Other Ambulatory Visit: Payer: Self-pay

## 2019-06-11 ENCOUNTER — Ambulatory Visit (INDEPENDENT_AMBULATORY_CARE_PROVIDER_SITE_OTHER): Payer: Medicare Other | Admitting: Licensed Clinical Social Worker

## 2019-06-11 ENCOUNTER — Encounter (HOSPITAL_COMMUNITY): Payer: Self-pay | Admitting: Licensed Clinical Social Worker

## 2019-06-11 DIAGNOSIS — F331 Major depressive disorder, recurrent, moderate: Secondary | ICD-10-CM | POA: Diagnosis not present

## 2019-06-11 DIAGNOSIS — F411 Generalized anxiety disorder: Secondary | ICD-10-CM | POA: Diagnosis not present

## 2019-06-11 NOTE — Progress Notes (Signed)
Virtual Visit via Video Note  I connected with Rodney Friedman on 06/11/19 at  9:00 AM EST by a video enabled telemedicine application and verified that I am speaking with the correct person using two identifiers.    I discussed the limitations of evaluation and management by telemedicine and the availability of in person appointments. The patient expressed understanding and agreed to proceed.  Type of Therapy: Individual Therapy  Treatment Goals addressed: Improve psychiatric symptoms, elevate mood (decreased irritability, increased enjoyment of activities), Improve unhelpful thought patterns, emotional regulation skills (reduce temper outburst), Interpersonal relationship skills  Interventions: Motivational Interviewing and Other: Grounding and Mindfulness techniques  Summary: Rodney Friedman is a 74y.o. male who presents with Major Depressive Disorder, moderate and Generalized Anxiety Disorder  Suicidal/Homicidal: No without intent/plan  Therapist Response:  [name] met with clinician for individual therapy. [name] discussed his psychiatric symptoms and current life events. [name] sharedthat he has been fairly okay, but depressed and anxious about the upcoming IRS meeting next week. He processed through his arguments about what he owes and what he would be able to pay. However, he reports that if they really try to make an example of him, he may owe over $20k, which he is unable to pay. Clinician utilized MI OARS to reflect and summarize thoughts and feelings. Clinician validated concerns and noted that [name] has been preparing for this for almost 2 years and whatever the result is, he will survive. Clinician provided more time for [name] to vent fear and frustration.  [name] reports ongoing concerns about his son's finances, inability to have more stable work and income, and Dossie's inability to provide a lot of extra help. Clinician processed this and reviewed previous conversations about this  topic and reminded [name] of what he can control and what he cannot.  [name] also provided updates about his grandchildren in [place], noting that he is not paying as much to the woman who is caring for his youngest granddaughter due to his own finances, as well as the fact that she only has one of his kids now. [name] reports he is in closer contact with daughter and she seems to be doing much better.    Plan: Return again in 2 weeks.  Diagnosis: Axis I: Major depressive disorder, moderate and Generalized Anxiety Disorder   I discussed the assessment and treatment plan with the patient. The patient was provided an opportunity to ask questions and all were answered. The patient agreed with the plan and demonstrated an understanding of the instructions.   The patient was advised to call back or seek an in-person evaluation if the symptoms worsen or if the condition fails to improve as anticipated.  I provided 55 minutes of non-face-to-face time during this encounter.   [name], LCSW

## 2019-06-26 DIAGNOSIS — C61 Malignant neoplasm of prostate: Secondary | ICD-10-CM | POA: Diagnosis not present

## 2019-07-02 ENCOUNTER — Ambulatory Visit (INDEPENDENT_AMBULATORY_CARE_PROVIDER_SITE_OTHER): Payer: Medicare Other | Admitting: Licensed Clinical Social Worker

## 2019-07-02 ENCOUNTER — Other Ambulatory Visit: Payer: Self-pay

## 2019-07-02 ENCOUNTER — Encounter (HOSPITAL_COMMUNITY): Payer: Self-pay | Admitting: Licensed Clinical Social Worker

## 2019-07-02 DIAGNOSIS — F331 Major depressive disorder, recurrent, moderate: Secondary | ICD-10-CM

## 2019-07-02 DIAGNOSIS — F411 Generalized anxiety disorder: Secondary | ICD-10-CM | POA: Diagnosis not present

## 2019-07-02 NOTE — Progress Notes (Signed)
Virtual Visit via Video Note  I connected with Rodney Friedman on 07/02/19 at  9:00 AM EST by a video enabled telemedicine application and verified that I am speaking with the correct person using two identifiers.    I discussed the limitations of evaluation and management by telemedicine and the availability of in person appointments. The patient expressed understanding and agreed to proceed.  Type of Therapy: Individual Therapy  Treatment Goals addressed: "to cope with stress and manage depression and anxiety". Rodney Friedman will report improved stress management and coping skills, resulting in mood stability and less worrying 4 out of 7 days.   Interventions: Motivational Interviewing and Other: Grounding and Mindfulness techniques  Summary: Rodney Friedman is a 73y.o. male who presents with Major Depressive Disorder, moderate and Generalized Anxiety Disorder  Suicidal/Homicidal: No without intent/plan  Therapist Response:  Rodney Friedman met with clinician for individual therapy. Rodney Friedman discussed his psychiatric symptoms and current life events. Rodney Friedman shared that he has been working very hard to care for his father, which has become more involved, as he ages. Clinician utilized MI OARS to reflect and summarize thoughts and feelings about having to change linens and diapers so often. Clinician explored emotional toll that takes on Rodney Friedman and his coping skills/ thought processes about this role. Clinician explored [facility] and identified that he has struggled to get in contact with his father's doctor, but now has it worked out. Clinician reflected how the technology piece of communicating during COVID has been challenging.  Rodney Friedman discussed updates on meeting with IRS and noted that the results were that he will owe less than 4K, which was a huge relief. Rodney Friedman reported that he understood what has been communicated by this clinician about thinking positively and that things will work out. Clinician agreed  that worry does not do anything to change situations, but preparedness and patience are often the most helpful tools.  Clinician and Rodney Friedman updated treatment plan.    Plan: Return again in 2 weeks.  Diagnosis: Axis I: Major depressive disorder, moderate and Generalized Anxiety Disorder    I discussed the assessment and treatment plan with the patient. The patient was provided an opportunity to ask questions and all were answered. The patient agreed with the plan and demonstrated an understanding of the instructions.   The patient was advised to call back or seek an in-person evaluation if the symptoms worsen or if the condition fails to improve as anticipated.  I provided 45 minutes of non-face-to-face time during this encounter.   Rodney Curling, LCSW

## 2019-07-09 DIAGNOSIS — C61 Malignant neoplasm of prostate: Secondary | ICD-10-CM | POA: Diagnosis not present

## 2019-07-10 ENCOUNTER — Encounter: Payer: Self-pay | Admitting: Hematology and Oncology

## 2019-07-10 ENCOUNTER — Encounter: Payer: Self-pay | Admitting: Family Medicine

## 2019-07-16 ENCOUNTER — Ambulatory Visit (INDEPENDENT_AMBULATORY_CARE_PROVIDER_SITE_OTHER): Payer: Medicare Other | Admitting: Licensed Clinical Social Worker

## 2019-07-16 ENCOUNTER — Encounter (HOSPITAL_COMMUNITY): Payer: Self-pay | Admitting: Licensed Clinical Social Worker

## 2019-07-16 ENCOUNTER — Other Ambulatory Visit: Payer: Self-pay

## 2019-07-16 DIAGNOSIS — F411 Generalized anxiety disorder: Secondary | ICD-10-CM | POA: Diagnosis not present

## 2019-07-16 DIAGNOSIS — F331 Major depressive disorder, recurrent, moderate: Secondary | ICD-10-CM

## 2019-07-16 NOTE — Progress Notes (Signed)
Virtual Visit via Video Note  I connected with Rodney Friedman on 07/16/19 at  9:00 AM EST by a video enabled telemedicine application and verified that I am speaking with the correct person using two identifiers.     I discussed the limitations of evaluation and management by telemedicine and the availability of in person appointments. The patient expressed understanding and agreed to proceed.  Type of Therapy: Individual Therapy  Treatment Goals addressed: "to cope with stress and manage depression and anxiety". Geddy will report improved stress management and coping skills, resulting in mood stability and less worrying 4 out of 7 days.   Interventions: Motivational Interviewing   Summary: Artis Beggs is a 74y.o. male who presents with Major Depressive Disorder, moderate and Generalized Anxiety Disorder  Suicidal/Homicidal: No without intent/plan  Therapist Response:  [name] met with clinician for individual therapy. Phyllip discussed his psychiatric symptoms and current life events. Lea shared that he has been doing well in terms of coping with financial issues, getting debts paid, and feeling a bit more confident. However, he reports ongoing concerns about his father's failing health. Clinician utilized MI OARS to reflect and summarize thoughts and feelings. Clinician processed changes and increased needs for his father. Clinician identified the importance of positive thinking, as well as reflecting and encouraging joy for his father and himself.   Plan: Return again in 2 weeks.  Diagnosis: Axis I: Major depressive disorder, moderate and Generalized Anxiety Disorder     I discussed the assessment and treatment plan with the patient. The patient was provided an opportunity to ask questions and all were answered. The patient agreed with the plan and demonstrated an understanding of the instructions.   The patient was advised to call back or seek an in-person evaluation if the  symptoms worsen or if the condition fails to improve as anticipated.  I provided 45 minutes of non-face-to-face time during this encounter.   [name], LCSW

## 2019-07-22 ENCOUNTER — Other Ambulatory Visit: Payer: Self-pay

## 2019-07-22 ENCOUNTER — Inpatient Hospital Stay (HOSPITAL_BASED_OUTPATIENT_CLINIC_OR_DEPARTMENT_OTHER): Payer: Medicare Other | Admitting: Hematology and Oncology

## 2019-07-22 ENCOUNTER — Telehealth: Payer: Self-pay | Admitting: Hematology and Oncology

## 2019-07-22 ENCOUNTER — Inpatient Hospital Stay: Payer: Medicare Other | Attending: Hematology and Oncology

## 2019-07-22 DIAGNOSIS — C9110 Chronic lymphocytic leukemia of B-cell type not having achieved remission: Secondary | ICD-10-CM

## 2019-07-22 DIAGNOSIS — I25709 Atherosclerosis of coronary artery bypass graft(s), unspecified, with unspecified angina pectoris: Secondary | ICD-10-CM

## 2019-07-22 DIAGNOSIS — Z7984 Long term (current) use of oral hypoglycemic drugs: Secondary | ICD-10-CM | POA: Diagnosis not present

## 2019-07-22 DIAGNOSIS — Z720 Tobacco use: Secondary | ICD-10-CM | POA: Diagnosis not present

## 2019-07-22 DIAGNOSIS — C61 Malignant neoplasm of prostate: Secondary | ICD-10-CM | POA: Diagnosis not present

## 2019-07-22 DIAGNOSIS — R7303 Prediabetes: Secondary | ICD-10-CM | POA: Diagnosis not present

## 2019-07-22 DIAGNOSIS — Z923 Personal history of irradiation: Secondary | ICD-10-CM | POA: Diagnosis not present

## 2019-07-22 DIAGNOSIS — C9111 Chronic lymphocytic leukemia of B-cell type in remission: Secondary | ICD-10-CM | POA: Insufficient documentation

## 2019-07-22 DIAGNOSIS — E119 Type 2 diabetes mellitus without complications: Secondary | ICD-10-CM | POA: Diagnosis not present

## 2019-07-22 DIAGNOSIS — Z79899 Other long term (current) drug therapy: Secondary | ICD-10-CM | POA: Insufficient documentation

## 2019-07-22 LAB — CBC WITH DIFFERENTIAL/PLATELET
Abs Immature Granulocytes: 0.06 10*3/uL (ref 0.00–0.07)
Basophils Absolute: 0.1 10*3/uL (ref 0.0–0.1)
Basophils Relative: 1 %
Eosinophils Absolute: 0.3 10*3/uL (ref 0.0–0.5)
Eosinophils Relative: 2 %
HCT: 39.1 % (ref 39.0–52.0)
Hemoglobin: 13.5 g/dL (ref 13.0–17.0)
Immature Granulocytes: 0 %
Lymphocytes Relative: 56 %
Lymphs Abs: 9.3 10*3/uL — ABNORMAL HIGH (ref 0.7–4.0)
MCH: 30.8 pg (ref 26.0–34.0)
MCHC: 34.5 g/dL (ref 30.0–36.0)
MCV: 89.1 fL (ref 80.0–100.0)
Monocytes Absolute: 1.1 10*3/uL — ABNORMAL HIGH (ref 0.1–1.0)
Monocytes Relative: 7 %
Neutro Abs: 5.6 10*3/uL (ref 1.7–7.7)
Neutrophils Relative %: 34 %
Platelets: 195 10*3/uL (ref 150–400)
RBC: 4.39 MIL/uL (ref 4.22–5.81)
RDW: 13 % (ref 11.5–15.5)
WBC: 16.3 10*3/uL — ABNORMAL HIGH (ref 4.0–10.5)
nRBC: 0 % (ref 0.0–0.2)

## 2019-07-22 MED ORDER — METFORMIN HCL 500 MG PO TABS: 500.0000 mg | ORAL_TABLET | Freq: Two times a day (BID) | ORAL | 1 refills | Status: DC

## 2019-07-22 NOTE — Telephone Encounter (Signed)
Scheduled appt per 3/2 sch message - mailed reminder letter with appt date and time 

## 2019-07-23 ENCOUNTER — Encounter: Payer: Self-pay | Admitting: Hematology and Oncology

## 2019-07-23 NOTE — Progress Notes (Signed)
[facility] OFFICE PROGRESS NOTE  Patient Care Team: Wendie Agreste, MD as PCP - General (Family Medicine) Croitoru, Dani Gobble, MD as PCP - Cardiology (Cardiology) Kathie Rhodes, MD as Consulting Physician (Urology) Croitoru, Dani Gobble, MD as Consulting Physician (Cardiology)  ASSESSMENT & PLAN:  CLL (chronic lymphocytic leukemia) From the CLL standpoint, he has no evidence of disease progression We discussed the signs and symptoms to watch out for I plan to see him back in 12 months with history, physical examination and blood work  Prostate cancer The patient is receiving Lupron injection under the care of urologist For now, he is not symptomatic and I would defer to urologist for further follow-up  Tobacco abuse I spent some time counseling the patient the importance of tobacco cessation. he appears motivated to quit on his own   Pre-diabetes The patient has not made significant progress with his weight loss effort We discussed the association of obesity/prediabetes with malignancies He appears to be interested to try to increase his Metformin and to work harder on his weight loss effort   No orders of the defined types were placed in this encounter.   All questions were answered. The patient knows to call the clinic with any problems, questions or concerns. The total time spent in the appointment was 20 minutes encounter with patients including review of chart and various tests results, discussions about plan of care and coordination of care plan   Rodney Lark, MD 07/23/2019 8:01 AM  INTERVAL HISTORY: Please see below for problem oriented charting. He returns for further follow-up He is doing well since completion of treatment for prostate cancer He still wake up multiple times at night to go to the bathroom to urinate No recent infection, fever or chills No new lymphadenopathy He continues to smoke several cigarettes per day He has tremendous stress in his  life looking after his father who is 69 with dementia  SUMMARY OF ONCOLOGIC HISTORY: Oncology History Overview Note  Prostate cancer   Primary site: Prostate (Left)   Staging method: AJCC 7th Edition   Clinical: Stage IIB (T1c, N0, M0) signed by Rodney Lark, MD on 07/09/2013 10:04 AM   Summary: Stage IIB (T1c, N0, M0)  CLL stage I. FISH normal   CLL (chronic lymphocytic leukemia) ([place])  07/08/2013 Pathology Results   Peripheral Blood Flow Cytometry - MONOCLONAL B-CELL POPULATION IDENTIFIED. Diagnosis Comment: The phenotypic features are consistent with chronic lymphocytic leukemia. Clinical correlation is recommended.    07/08/2013 Pathology Results   FISH panel is normal   Prostate cancer ([place])  07/12/2012 Tumor Marker   PSA was high at 9.6   09/30/2012 Procedure   He underwent prostate biopsy at confirm diagnosis of prostate cancer.   11/11/2012 - 01/09/2013 Radiation Therapy   The patient completed radiation therapy to his prostate using IM RT technique   06/22/2017 PET scan   PET scan showed left external LN involvement along with activity within prostate     REVIEW OF SYSTEMS:   Constitutional: Denies fevers, chills or abnormal weight loss Eyes: Denies blurriness of vision Ears, nose, mouth, throat, and face: Denies mucositis or sore throat Respiratory: Denies cough, dyspnea or wheezes Cardiovascular: Denies palpitation, chest discomfort or lower extremity swelling Gastrointestinal:  Denies nausea, heartburn or change in bowel habits Skin: Denies abnormal skin rashes Lymphatics: Denies new lymphadenopathy or easy bruising Neurological:Denies numbness, tingling or new weaknesses Behavioral/Psych: Mood is stable, no new changes  All other systems were reviewed with the patient and  are negative.  I have reviewed the past medical history, past surgical history, social history and family history with the patient and they are unchanged from previous note.  ALLERGIES:  is  allergic to other.  MEDICATIONS:  Current Outpatient Medications  Medication Sig Dispense Refill  . amLODipine (NORVASC) 10 MG tablet Take 1 tablet (10 mg total) by mouth daily. 90 tablet 3  . B Complex Vitamins (VITAMIN-B COMPLEX PO) Take by mouth.    Marland Kitchen BIOTIN PO Take by mouth.    Marland Kitchen buPROPion (WELLBUTRIN XL) 300 MG 24 hr tablet 1  qam 90 tablet 2  . Calcium Carbonate-Vit D-Min (CALCIUM 1200 PO) Take 1,200 mg by mouth daily.    Mariane Baumgarten Calcium (STOOL SOFTENER PO) Take 1 tablet as needed by mouth.    . ezetimibe (ZETIA) 10 MG tablet Take 1 tablet (10 mg total) by mouth daily. 90 tablet 3  . gabapentin (NEURONTIN) 100 MG capsule 3  qhs 270 capsule 1  . ketoconazole (NIZORAL) 2 % shampoo Apply 1 application topically 2 (two) times a week. 120 mL 1  . latanoprost (XALATAN) 0.005 % ophthalmic solution Once a day every second day    . Leuprolide Acetate, 6 Month, (LUPRON) 45 MG injection Inject 45 mg into the muscle every 6 (six) months.    Marland Kitchen LYCOPENE PO Take 8 mg by mouth daily.    . metFORMIN (GLUCOPHAGE) 500 MG tablet Take 1 tablet (500 mg total) by mouth 2 (two) times daily with a meal. 180 tablet 1  . metoprolol (TOPROL-XL) 200 MG 24 hr tablet Take 0.5 tablets (100 mg total) by mouth 2 (two) times daily. Take with or immediately following a meal. 90 tablet 3  . Omega-3 Fatty Acids (FISH OIL) 1000 MG CAPS Take 1,000 mg by mouth daily.    . rosuvastatin (CRESTOR) 20 MG tablet Take 1 tablet (20 mg total) by mouth daily. 90 tablet 3  . valsartan (DIOVAN) 320 MG tablet Take 1 tablet (320 mg total) by mouth daily. 90 tablet 3  . vitamin B-12 (CYANOCOBALAMIN) 500 MCG tablet Take 500 mcg by mouth daily.    . vitamin C (ASCORBIC ACID) 500 MG tablet Take 500 mg by mouth daily.    Marland Kitchen warfarin (COUMADIN) 4 MG tablet TAKE 1/2 TO 1 TABLET DAILY AS DIRECTED BY COUMADIN CLINIC 90 tablet 1   Current Facility-Administered Medications  Medication Dose Route Frequency Provider Last Rate Last Admin  . 0.9 %   sodium chloride infusion  500 mL Intravenous Once Nelida Meuse III, MD        PHYSICAL EXAMINATION: ECOG PERFORMANCE STATUS: 1 - Symptomatic but completely ambulatory  Vitals:   07/22/19 0816  BP: (!) 147/45  Pulse: (!) 52  Resp: 18  Temp: 97.8 F (36.6 C)  SpO2: 100%   Filed Weights   07/22/19 0816  Weight: 228 lb 9.6 oz (103.7 kg)    GENERAL:alert, no distress and comfortable SKIN: skin color, texture, turgor are normal, no rashes or significant lesions EYES: normal, Conjunctiva are pink and non-injected, sclera clear OROPHARYNX:no exudate, no erythema and lips, buccal mucosa, and tongue normal  NECK: supple, thyroid normal size, non-tender, without nodularity LYMPH:  no palpable lymphadenopathy in the cervical, axillary or inguinal LUNGS: clear to auscultation and percussion with normal breathing effort HEART: regular rate & rhythm and no murmurs and no lower extremity edema ABDOMEN:abdomen soft, non-tender and normal bowel sounds Musculoskeletal:no cyanosis of digits and no clubbing  NEURO: alert & oriented x  3 with fluent speech, no focal motor/sensory deficits  LABORATORY DATA:  I have reviewed the data as listed    Component Value Date/Time   NA 143 04/30/2019 1103   NA 141 01/04/2015 0808   K 4.5 04/30/2019 1103   K 4.3 01/04/2015 0808   CL 107 (H) 04/30/2019 1103   CO2 21 04/30/2019 1103   CO2 21 (L) 01/04/2015 0808   GLUCOSE 114 (H) 04/30/2019 1103   GLUCOSE 89 03/10/2016 0830   GLUCOSE 115 01/04/2015 0808   BUN 12 04/30/2019 1103   BUN 18.7 01/04/2015 0808   CREATININE 0.88 04/30/2019 1103   CREATININE 1.16 03/10/2016 0830   CREATININE 0.9 01/04/2015 0808   CALCIUM 9.6 04/30/2019 1103   CALCIUM 9.0 01/04/2015 0808   PROT 6.6 04/30/2019 1103   PROT 6.3 (L) 01/04/2015 0808   ALBUMIN 4.4 04/30/2019 1103   ALBUMIN 3.7 01/04/2015 0808   AST 48 (H) 04/30/2019 1103   AST 19 01/04/2015 0808   ALT 62 (H) 04/30/2019 1103   ALT 33 01/04/2015 0808    ALKPHOS 55 04/30/2019 1103   ALKPHOS 47 01/04/2015 0808   BILITOT 0.3 04/30/2019 1103   BILITOT 0.23 01/04/2015 0808   GFRNONAA 86 04/30/2019 1103   GFRNONAA 70 02/11/2016 1526   GFRAA 100 04/30/2019 1103   GFRAA 81 02/11/2016 1526    No results found for: SPEP, UPEP  Lab Results  Component Value Date   WBC 16.3 (H) 07/22/2019   NEUTROABS 5.6 07/22/2019   HGB 13.5 07/22/2019   HCT 39.1 07/22/2019   MCV 89.1 07/22/2019   PLT 195 07/22/2019      Chemistry      Component Value Date/Time   NA 143 04/30/2019 1103   NA 141 01/04/2015 0808   K 4.5 04/30/2019 1103   K 4.3 01/04/2015 0808   CL 107 (H) 04/30/2019 1103   CO2 21 04/30/2019 1103   CO2 21 (L) 01/04/2015 0808   BUN 12 04/30/2019 1103   BUN 18.7 01/04/2015 0808   CREATININE 0.88 04/30/2019 1103   CREATININE 1.16 03/10/2016 0830   CREATININE 0.9 01/04/2015 0808      Component Value Date/Time   CALCIUM 9.6 04/30/2019 1103   CALCIUM 9.0 01/04/2015 0808   ALKPHOS 55 04/30/2019 1103   ALKPHOS 47 01/04/2015 0808   AST 48 (H) 04/30/2019 1103   AST 19 01/04/2015 0808   ALT 62 (H) 04/30/2019 1103   ALT 33 01/04/2015 0808   BILITOT 0.3 04/30/2019 1103   BILITOT 0.23 01/04/2015 [id number]

## 2019-07-23 NOTE — Assessment & Plan Note (Signed)
From the CLL standpoint, he has no evidence of disease progression We discussed the signs and symptoms to watch out for I plan to see him back in 12 months with history, physical examination and blood work

## 2019-07-23 NOTE — Assessment & Plan Note (Signed)
The patient is receiving Lupron injection under the care of urologist For now, he is not symptomatic and I would defer to urologist for further follow-up

## 2019-07-23 NOTE — Assessment & Plan Note (Signed)
I spent some time counseling the patient the importance of tobacco cessation. he appears motivated to quit on his own

## 2019-07-23 NOTE — Assessment & Plan Note (Signed)
The patient has not made significant progress with his weight loss effort We discussed the association of obesity/prediabetes with malignancies He appears to be interested to try to increase his Metformin and to work harder on his weight loss effort

## 2019-07-27 ENCOUNTER — Other Ambulatory Visit: Payer: Self-pay | Admitting: Cardiovascular Disease

## 2019-07-30 ENCOUNTER — Ambulatory Visit (HOSPITAL_COMMUNITY): Payer: Medicare Other | Admitting: Psychiatry

## 2019-07-31 ENCOUNTER — Other Ambulatory Visit: Payer: Self-pay

## 2019-07-31 ENCOUNTER — Encounter (HOSPITAL_COMMUNITY): Payer: Self-pay | Admitting: Licensed Clinical Social Worker

## 2019-07-31 ENCOUNTER — Ambulatory Visit (INDEPENDENT_AMBULATORY_CARE_PROVIDER_SITE_OTHER): Payer: Medicare Other | Admitting: Licensed Clinical Social Worker

## 2019-07-31 DIAGNOSIS — F411 Generalized anxiety disorder: Secondary | ICD-10-CM

## 2019-07-31 DIAGNOSIS — F331 Major depressive disorder, recurrent, moderate: Secondary | ICD-10-CM

## 2019-07-31 NOTE — Progress Notes (Signed)
Virtual Visit via Video Note  I connected with Orlan Aversa on 07/31/19 at  9:00 AM EST by a video enabled telemedicine application and verified that I am speaking with the correct person using two identifiers.     I discussed the limitations of evaluation and management by telemedicine and the availability of in person appointments. The patient expressed understanding and agreed to proceed.  Type of Therapy: Individual Therapy  Treatment Goals addressed: "to cope with stress and manage depression and anxiety". Cailen will report improved stress management and coping skills, resulting in mood stability and less worrying 4 out of 7 days.  Interventions: Motivational Interviewing   Summary: Rony Ratz is a 74y.o. male who presents with Major Depressive Disorder, moderate and Generalized Anxiety Disorder  Suicidal/Homicidal: No without intent/plan  Therapist Response:  [name] met with clinician for individual therapy. Nasif discussed his psychiatric symptoms and current life events. Ayomikun shared that he has been down and very concerned about his father. Clinician processed concerns using MI OARS. Clinician assisted with case management and organizational tasks. Clinician also provided referral info to Hospice if needed. Clinician processed experiences with Hospice when Seann's mother died and noted the services available to him when the time comes. Clinician reassured Aaronmichael that he has been a supportive caregiver for 19 years and that sometimes end of life comes, regardless of how much you try to fight it.   Plan: Return again in 2 weeks.  Diagnosis: Axis I: Major depressive disorder, moderate and Generalized Anxiety Disorder     I discussed the assessment and treatment plan with the patient. The patient was provided an opportunity to ask questions and all were answered. The patient agreed with the plan and demonstrated an understanding of the instructions.   The patient was  advised to call back or seek an in-person evaluation if the symptoms worsen or if the condition fails to improve as anticipated.  I provided 45 minutes of non-face-to-face time during this encounter.   [name], LCSW

## 2019-08-01 ENCOUNTER — Other Ambulatory Visit: Payer: Self-pay

## 2019-08-01 ENCOUNTER — Ambulatory Visit (INDEPENDENT_AMBULATORY_CARE_PROVIDER_SITE_OTHER): Payer: Medicare Other | Admitting: Psychiatry

## 2019-08-01 DIAGNOSIS — F324 Major depressive disorder, single episode, in partial remission: Secondary | ICD-10-CM

## 2019-08-01 DIAGNOSIS — I25709 Atherosclerosis of coronary artery bypass graft(s), unspecified, with unspecified angina pectoris: Secondary | ICD-10-CM

## 2019-08-01 DIAGNOSIS — F17210 Nicotine dependence, cigarettes, uncomplicated: Secondary | ICD-10-CM

## 2019-08-01 MED ORDER — BUPROPION HCL ER (XL) 300 MG PO TB24: ORAL_TABLET | ORAL | 2 refills | Status: DC

## 2019-08-01 MED ORDER — GABAPENTIN 100 MG PO CAPS: ORAL_CAPSULE | ORAL | 1 refills | Status: DC

## 2019-08-01 NOTE — Progress Notes (Signed)
Psychiatric Initial Adult Assessment   Patient Identification: Rodney Friedman MRN:  [id number] Date of Evaluation:  08/01/2019 Referral Source: Dr. Nyoka Cowden Chief Complaint: Need follow-up care   Visit Diagnosis: Bipolar disorder, substance use disorder Bipolar disorder  History of Present Illness:  Overall the patient is doing fair.  The good news is it turns that he only owes the IRS $4000 which she is much relieved now.  The bed he says his father is now declining more and more.  He was hospitalized for 3 days due to failure to thrive.  His father is now home but he is bedridden.  He needs lots of assistance.  He is having physical therapy and Occupational Therapy come to the house.  Overall though the patient is sleeping and eating well.  He is got good energy.  He still has difficulty getting along with his 72-year-old granddaughter but she is in other ways doing well.  The patient drinks no alcohol uses no drugs.  He is positive in many ways but bothered a great deal by the fact that his father is declining.  Is hopeful his life is getting better.  He is still in therapy at this time.   Substance Abuse History in the last 12 months:  Yes.    Consequences of Substance Abuse: NA  Past Medical History:  Past Medical History:  Diagnosis Date  . Adenomatous colon polyp   . Alcoholism ([place])   . Anxiety   . Atrial fibrillation ([place])   . CAD (coronary artery disease)   . Cataract    removed left eye   . 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 Depression   . Diabetes mellitus without complication ([place])   . Diverticulosis   . Eye abnormality    Macular scarring R eye  . Glaucoma   . Heart murmur   . HLD (hyperlipidemia)   . Hypertension   . Leukemia ([place])    CLL  . Myocardial infarction ([place]) 2006  . Prostate cancer ([place]) 07/08/2013   [facility] at [facility]- getting Lupron shot every 6 months - traces in prostate and 2 lymphnodes left hip- non focused traces per pt    . Sleep apnea   . Substance abuse ([place])   . 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 Arm Surgery Right    from door accident with glass  . CATARACT EXTRACTION W/ INTRAOCULAR LENS IMPLANT Left   . COLONOSCOPY    . 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 POLYPECTOMY      Family Psychiatric History:   Family History:  Family History  Problem Relation Age of Onset  . Colon cancer Mother   . Ovarian cancer Mother   . Uterine cancer Mother   . Hypertension Father   . Prostate cancer Father   . Colon polyps Neg Hx     Social History:   Social History   Socioeconomic History  . Marital status: Divorced    Spouse name: Not on file  . Number of children: 2  . Years of education: 15  . Highest education level: Not on file  Occupational History  . Occupation: Retired Pharmacist, hospital  Tobacco Use  . Smoking status: Current Every Day Smoker    Packs/day: 0.50    Years: 50.00    Pack years: 25.00    Types: Cigarettes  . Smokeless tobacco: Never Used  Substance and Sexual Activity  . Alcohol use: No  Alcohol/week: 0.0 standard drinks  . Drug use: No  . Sexual activity: Not Currently  Other Topics Concern  . Not on file  Social History Narrative   Lives at home w/ his father   Divorced   Right-handed   Education: Secretary/administrator   No caffeine   Social Determinants of Radio broadcast assistant Strain:   . Difficulty of Paying Living Expenses:   Food Insecurity:   . Worried About Charity fundraiser in the Last Year:   . Arboriculturist in the Last Year:   Transportation Needs:   . Film/video editor (Medical):   Marland Kitchen Lack of Transportation (Non-Medical):   Physical Activity:   . Days of Exercise per Week:   . Minutes of Exercise per Session:   Stress:   . Feeling of Stress :   Social Connections:   . Frequency of Communication with Friends and Family:   . Frequency of Social Gatherings with Friends and Family:   . Attends  Religious Services:   . Active Member of Clubs or Organizations:   . Attends Archivist Meetings:   Marland Kitchen Marital Status:     Additional Social History:   Allergies:   Allergies  Allergen Reactions  . Other Rash    Allergen: "Plants and bushes while doing yard work"    Metabolic Disorder Labs: Lab Results  Component Value Date   HGBA1C 6.9 (H) 04/30/2019   MPG 117 02/11/2016   MPG 128 (H) 03/22/2015   No results found for: PROLACTIN Lab Results  Component Value Date   CHOL 119 03/13/2019   TRIG 271 (H) 03/13/2019   HDL 29 (L) 03/13/2019   CHOLHDL 4.1 03/13/2019   VLDL 41 (H) 03/10/2016   LDLCALC 48 03/13/2019   LDLCALC 111 (H) 10/30/2018     Current Medications: Current Outpatient Medications  Medication Sig Dispense Refill  . amLODipine (NORVASC) 10 MG tablet Take 1 tablet (10 mg total) by mouth daily. 90 tablet 3  . B Complex Vitamins (VITAMIN-B COMPLEX PO) Take by mouth.    Marland Kitchen BIOTIN PO Take by mouth.    Marland Kitchen buPROPion (WELLBUTRIN XL) 300 MG 24 hr tablet 1  qam 90 tablet 2  . Calcium Carbonate-Vit D-Min (CALCIUM 1200 PO) Take 1,200 mg by mouth daily.    Mariane Baumgarten Calcium (STOOL SOFTENER PO) Take 1 tablet as needed by mouth.    . ezetimibe (ZETIA) 10 MG tablet TAKE 1 TABLET BY MOUTH EVERY DAY 90 tablet 3  . gabapentin (NEURONTIN) 100 MG capsule 3  qhs 270 capsule 1  . ketoconazole (NIZORAL) 2 % shampoo Apply 1 application topically 2 (two) times a week. 120 mL 1  . latanoprost (XALATAN) 0.005 % ophthalmic solution Once a day every second day    . Leuprolide Acetate, 6 Month, (LUPRON) 45 MG injection Inject 45 mg into the muscle every 6 (six) months.    Marland Kitchen LYCOPENE PO Take 8 mg by mouth daily.    . metFORMIN (GLUCOPHAGE) 500 MG tablet Take 1 tablet (500 mg total) by mouth 2 (two) times daily with a meal. 180 tablet 1  . metoprolol (TOPROL-XL) 200 MG 24 hr tablet Take 0.5 tablets (100 mg total) by mouth 2 (two) times daily. Take with or immediately following a  meal. 90 tablet 3  . Omega-3 Fatty Acids (FISH OIL) 1000 MG CAPS Take 1,000 mg by mouth daily.    . rosuvastatin (CRESTOR) 20 MG tablet Take 1 tablet (20 mg  total) by mouth daily. 90 tablet 3  . valsartan (DIOVAN) 320 MG tablet Take 1 tablet (320 mg total) by mouth daily. 90 tablet 3  . vitamin B-12 (CYANOCOBALAMIN) 500 MCG tablet Take 500 mcg by mouth daily.    . vitamin C (ASCORBIC ACID) 500 MG tablet Take 500 mg by mouth daily.    Marland Kitchen warfarin (COUMADIN) 4 MG tablet TAKE 1/2 TO 1 TABLET DAILY AS DIRECTED BY COUMADIN CLINIC 90 tablet 1   Current Facility-Administered Medications  Medication Dose Route Frequency Provider Last Rate Last Admin  . 0.9 %  sodium chloride infusion  500 mL Intravenous Once Doran Stabler, MD        Neurologic: Headache: No Seizure: No Paresthesias:No  Musculoskeletal: Strength & Muscle Tone: within normal limits Gait & Station: normal Patient leans: Right  Psychiatric Specialty Exam: ROS  There were no vitals taken for this visit.There is no height or weight on file to calculate BMI.  General Appearance: Casual  Eye Contact:  Good  Speech:  Clear and Coherent  Volume:  Normal  Mood:  NA  Affect:  Appropriate  Thought Process:  Goal Directed  Orientation:  NA  Thought Content:  WDL  Suicidal Thoughts:  No  Homicidal Thoughts:  No  Memory:  Negative  Judgement:  Good  Insight:  Good  Psychomotor Activity:  Normal  Concentration:    Recall:  [place] of Knowledge:Good  Language: Good  Akathisia:  No  Handed:  Right  AIMS (if indicated):    Assets:  Desire for Improvement  ADL's:  Intact  Cognition: WNL  Sleep:      Treatment Plan Summary: 3/12/20219:06 AM   This patient's first problem is that of major depression.  He is doing fairly well.  He denies persistent daily depression or anhedonia.  He has few vegetative symptoms.  He takes Wellbutrin 300 mg.  His second problem is insomnia the patient takes 300 mg of Neurontin and does  well.  His third problem is alcohol use disorder.  The patient is very involved in [name].  Patient also benefits by being in talking therapy.  Patient return to see me in 4 months.  By that time he received vaccines.

## 2019-08-04 ENCOUNTER — Other Ambulatory Visit: Payer: Self-pay

## 2019-08-04 ENCOUNTER — Ambulatory Visit (INDEPENDENT_AMBULATORY_CARE_PROVIDER_SITE_OTHER): Payer: Medicare Other | Admitting: Pharmacist

## 2019-08-04 DIAGNOSIS — Z7901 Long term (current) use of anticoagulants: Secondary | ICD-10-CM | POA: Diagnosis not present

## 2019-08-04 DIAGNOSIS — I480 Paroxysmal atrial fibrillation: Secondary | ICD-10-CM

## 2019-08-04 DIAGNOSIS — I482 Chronic atrial fibrillation, unspecified: Secondary | ICD-10-CM | POA: Diagnosis not present

## 2019-08-04 LAB — POCT INR: INR: 2.4 (ref 2.0–3.0)

## 2019-08-07 ENCOUNTER — Ambulatory Visit (INDEPENDENT_AMBULATORY_CARE_PROVIDER_SITE_OTHER): Payer: Medicare Other | Admitting: Licensed Clinical Social Worker

## 2019-08-07 ENCOUNTER — Encounter (HOSPITAL_COMMUNITY): Payer: Self-pay | Admitting: Licensed Clinical Social Worker

## 2019-08-07 DIAGNOSIS — F411 Generalized anxiety disorder: Secondary | ICD-10-CM

## 2019-08-07 DIAGNOSIS — F331 Major depressive disorder, recurrent, moderate: Secondary | ICD-10-CM | POA: Diagnosis not present

## 2019-08-07 NOTE — Progress Notes (Signed)
Virtual Visit via Video Note  I connected with Rodney Friedman on 08/07/19 at  3:30 PM EDT by a video enabled telemedicine application and verified that I am speaking with the correct person using two identifiers.     I discussed the limitations of evaluation and management by telemedicine and the availability of in person appointments. The patient expressed understanding and agreed to proceed.  Type of Therapy: Individual Therapy   Treatment Goals addressed:  "to cope with stress and manage depression and anxiety". Rodney Friedman will report improved stress management and coping skills, resulting in mood stability and less worrying 4 out of 7 days.    Interventions: Motivational Interviewing    Summary: Rodney Friedman is a 74 y.o. male who presents with Major Depressive Disorder, moderate and Generalized Anxiety Disorder   Suicidal/Homicidal: No without intent/plan   Therapist Response:   Rodney Friedman met with clinician for individual therapy. Rodney Friedman discussed his psychiatric symptoms and current life events. Rodney Friedman shared that his father died yesterday. Clinician utilized supportive and grief counseling to process the event, as well as current emotional state. Clinician provided psychoeducation about the grief process and identified the importance of allowing himself to go through this process. Clinician validated that despair will be present, but that telling the stories of his father and keeping the memories alive will bring comfort. Rodney Friedman reports his granddaughter will be coming this evening from [facility] to spend a long weekend. Clinician encouraged Rodney Friedman to talk about his father with her and for them to do something to commemorate his life.    Plan: Return again in 2 weeks.   Diagnosis: Axis I: Major depressive disorder, moderate and Generalized Anxiety Disorder      I discussed the assessment and treatment plan with the patient. The patient was provided an opportunity to ask questions and all were answered.  The patient agreed with the plan and demonstrated an understanding of the instructions.   The patient was advised to call back or seek an in-person evaluation if the symptoms worsen or if the condition fails to improve as anticipated.  I provided 50 minutes of non-face-to-face time during this encounter.   [name], LCSW

## 2019-08-08 DIAGNOSIS — Z961 Presence of intraocular lens: Secondary | ICD-10-CM | POA: Diagnosis not present

## 2019-08-08 DIAGNOSIS — H35713 Central serous chorioretinopathy, bilateral: Secondary | ICD-10-CM | POA: Diagnosis not present

## 2019-08-08 DIAGNOSIS — H31011 Macula scars of posterior pole (postinflammatory) (post-traumatic), right eye: Secondary | ICD-10-CM | POA: Diagnosis not present

## 2019-08-08 DIAGNOSIS — H2511 Age-related nuclear cataract, right eye: Secondary | ICD-10-CM | POA: Diagnosis not present

## 2019-08-08 DIAGNOSIS — H401132 Primary open-angle glaucoma, bilateral, moderate stage: Secondary | ICD-10-CM | POA: Diagnosis not present

## 2019-08-14 ENCOUNTER — Ambulatory Visit (INDEPENDENT_AMBULATORY_CARE_PROVIDER_SITE_OTHER): Payer: Medicare Other | Admitting: Licensed Clinical Social Worker

## 2019-08-14 ENCOUNTER — Encounter (HOSPITAL_COMMUNITY): Payer: Self-pay | Admitting: Licensed Clinical Social Worker

## 2019-08-14 ENCOUNTER — Other Ambulatory Visit: Payer: Self-pay

## 2019-08-14 DIAGNOSIS — F331 Major depressive disorder, recurrent, moderate: Secondary | ICD-10-CM

## 2019-08-14 DIAGNOSIS — F411 Generalized anxiety disorder: Secondary | ICD-10-CM | POA: Diagnosis not present

## 2019-08-14 NOTE — Progress Notes (Signed)
Virtual Visit via Video Note  I connected with Rodney Friedman on 08/14/19 at  9:00 AM EDT by a video enabled telemedicine application and verified that I am speaking with the correct person using two identifiers.   I discussed the limitations of evaluation and management by telemedicine and the availability of in person appointments. The patient expressed understanding and agreed to proceed.  Treatment Goals addressed:  "to cope with stress and manage depression and anxiety". Favian will report improved stress management and coping skills, resulting in mood stability and less worrying 4 out of 7 days.    Interventions: Motivational Interviewing    Summary: Rodney Friedman is a 74 y.o. male who presents with Major Depressive Disorder, moderate and Generalized Anxiety Disorder   Suicidal/Homicidal: No without intent/plan   Therapist Response:   [name] met with clinician for individual therapy. [place] discussed his psychiatric symptoms and current life events. Rodney Friedman shared that he feels increasingly frustrated, irritable, and sad about so many things, including the loss of his father, his poor relationship with son and granddaughter, and his own health. Clinician noted the impact of grief on these other issues and explored use of coping skills using CBT. Clinician processed how the grief has increased his irritability. Clinician processed the use of self care, talking to his support system, increasing involvement in AA, can help him feel better. Rodney Friedman denied interest in using Hospice for grief counseling. However, he agreed that he needs to process the loss of his father. Clinician provided psychoeducation about grief and encouraged him to express his feelings through positive activities, such as talking, art, telling stories, and looking at pictures.     Plan: Return again in 2 weeks.   Diagnosis: Axis I: Major depressive disorder, moderate and Generalized Anxiety Disorder       I discussed the  assessment and treatment plan with the patient. The patient was provided an opportunity to ask questions and all were answered. The patient agreed with the plan and demonstrated an understanding of the instructions.   The patient was advised to call back or seek an in-person evaluation if the symptoms worsen or if the condition fails to improve as anticipated.  I provided 45 minutes of non-face-to-face time during this encounter.   Rodney Curling, LCSW

## 2019-08-15 ENCOUNTER — Other Ambulatory Visit: Payer: Self-pay | Admitting: Family Medicine

## 2019-08-15 DIAGNOSIS — I10 Essential (primary) hypertension: Secondary | ICD-10-CM

## 2019-08-20 DIAGNOSIS — Z23 Encounter for immunization: Secondary | ICD-10-CM | POA: Diagnosis not present

## 2019-08-21 ENCOUNTER — Encounter: Payer: Self-pay | Admitting: Hematology and Oncology

## 2019-09-02 ENCOUNTER — Ambulatory Visit (INDEPENDENT_AMBULATORY_CARE_PROVIDER_SITE_OTHER): Payer: Medicare Other | Admitting: Licensed Clinical Social Worker

## 2019-09-02 ENCOUNTER — Other Ambulatory Visit: Payer: Self-pay

## 2019-09-02 ENCOUNTER — Encounter (HOSPITAL_COMMUNITY): Payer: Self-pay | Admitting: Licensed Clinical Social Worker

## 2019-09-02 DIAGNOSIS — F331 Major depressive disorder, recurrent, moderate: Secondary | ICD-10-CM | POA: Diagnosis not present

## 2019-09-02 DIAGNOSIS — F411 Generalized anxiety disorder: Secondary | ICD-10-CM

## 2019-09-02 NOTE — Progress Notes (Signed)
   THERAPIST PROGRESS NOTE  Session Time: 9:00am-10:00am  Participation Level: Active  Behavioral Response: Well GroomedAlertDepressed and Irritable  Type of Therapy: Individual Therapy  Treatment Goals addressed:  "to cope with stress and manage depression and anxiety". Snyder will report improved stress management and coping skills, resulting in mood stability and less worrying 4 out of 7 days.    Interventions: Motivational Interviewing    Summary: Rodney Friedman is a 74 y.o. male who presents with Major Depressive Disorder, moderate and Generalized Anxiety Disorder   Suicidal/Homicidal: No without intent/plan   Therapist Response:   [name] met with clinician for individual therapy. Rodney Friedman discussed his psychiatric symptoms and current life events. Rodney Friedman shared that he continues to grieve and feel lonely and depressed over the loss of his father. Clinician processed through the grief process, noting that his feelings and experiences are to be expected and normal, but painful nonetheless. Clinician discussed the logistical issues he is going through, with banks, trusts, lawyers, etc. Clinician utilized MI OARS to reflect and summarize the challenges of getting all of these things done, especially since he is on his own. Clinician explored usefulness of support system and noted that he has reached out to friends in AA/NA that work in these fields who can help. Clinician discussed relationship with son and granddaughter. Clinician utilized MI OARS to reflect frustration and sadness over the current status. Clinician urged Rodney Friedman to widen his scope and to let go of his anger and drop resentment.   Plan: Return again in 2 weeks.   Diagnosis: Axis I: Major depressive disorder, moderate and Generalized Anxiety Disorder  [name], LCSW 09/02/2019

## 2019-09-03 ENCOUNTER — Ambulatory Visit (HOSPITAL_COMMUNITY): Payer: Medicare Other | Admitting: Licensed Clinical Social Worker

## 2019-09-09 ENCOUNTER — Other Ambulatory Visit: Payer: Self-pay

## 2019-09-09 ENCOUNTER — Ambulatory Visit (INDEPENDENT_AMBULATORY_CARE_PROVIDER_SITE_OTHER): Payer: Medicare Other | Admitting: Cardiovascular Disease

## 2019-09-09 ENCOUNTER — Ambulatory Visit (INDEPENDENT_AMBULATORY_CARE_PROVIDER_SITE_OTHER): Payer: Medicare Other | Admitting: Pharmacist

## 2019-09-09 ENCOUNTER — Encounter: Payer: Self-pay | Admitting: Cardiovascular Disease

## 2019-09-09 VITALS — BP 140/62 | HR 61 | Temp 96.5°F | Resp 20 | Ht 69.0 in | Wt 218.8 lb

## 2019-09-09 DIAGNOSIS — F17200 Nicotine dependence, unspecified, uncomplicated: Secondary | ICD-10-CM | POA: Diagnosis not present

## 2019-09-09 DIAGNOSIS — R0989 Other specified symptoms and signs involving the circulatory and respiratory systems: Secondary | ICD-10-CM | POA: Diagnosis not present

## 2019-09-09 DIAGNOSIS — I480 Paroxysmal atrial fibrillation: Secondary | ICD-10-CM

## 2019-09-09 DIAGNOSIS — I10 Essential (primary) hypertension: Secondary | ICD-10-CM

## 2019-09-09 DIAGNOSIS — E669 Obesity, unspecified: Secondary | ICD-10-CM

## 2019-09-09 DIAGNOSIS — E1169 Type 2 diabetes mellitus with other specified complication: Secondary | ICD-10-CM

## 2019-09-09 DIAGNOSIS — E785 Hyperlipidemia, unspecified: Secondary | ICD-10-CM

## 2019-09-09 DIAGNOSIS — C61 Malignant neoplasm of prostate: Secondary | ICD-10-CM

## 2019-09-09 DIAGNOSIS — I350 Nonrheumatic aortic (valve) stenosis: Secondary | ICD-10-CM

## 2019-09-09 DIAGNOSIS — I25709 Atherosclerosis of coronary artery bypass graft(s), unspecified, with unspecified angina pectoris: Secondary | ICD-10-CM | POA: Diagnosis not present

## 2019-09-09 DIAGNOSIS — C9110 Chronic lymphocytic leukemia of B-cell type not having achieved remission: Secondary | ICD-10-CM

## 2019-09-09 DIAGNOSIS — Z7901 Long term (current) use of anticoagulants: Secondary | ICD-10-CM | POA: Diagnosis not present

## 2019-09-09 DIAGNOSIS — G4733 Obstructive sleep apnea (adult) (pediatric): Secondary | ICD-10-CM

## 2019-09-09 DIAGNOSIS — I482 Chronic atrial fibrillation, unspecified: Secondary | ICD-10-CM | POA: Diagnosis not present

## 2019-09-09 LAB — POCT INR: INR: 1.4 — AB (ref 2.0–3.0)

## 2019-09-09 NOTE — Patient Instructions (Signed)
Medication Instructions:  No changes *If you need a refill on your cardiac medications before your next appointment, please call your pharmacy*   Lab Work: None ordered If you have labs (blood work) drawn today and your tests are completely normal, you will receive your results only by: Marland Kitchen MyChart Message (if you have MyChart) OR . A paper copy in the mail If you have any lab test that is abnormal or we need to change your treatment, we will call you to review the results.   Testing/Procedures: Your physician has requested that you have an echocardiogram in 3 months. Echocardiography is a painless test that uses sound waves to create images of your heart. It provides your doctor with information about the size and shape of your heart and how well your heart's chambers and valves are working. You may receive an ultrasound enhancing agent through an IV if needed to better visualize your heart during the echo.This procedure takes approximately one hour. There are no restrictions for this procedure. This will take place at the [address], [address].     Follow-Up: At [facility], you and your health needs are our priority.  As part of our continuing mission to provide you with exceptional heart care, we have created designated Provider Care Teams.  These Care Teams include your primary Cardiologist (physician) and Advanced Practice Providers (APPs -  Physician Assistants and Nurse Practitioners) who all work together to provide you with the care you need, when you need it.  We recommend signing up for the patient portal called "MyChart".  Sign up information is provided on this After Visit Summary.  MyChart is used to connect with patients for Virtual Visits (Telemedicine).  Patients are able to view lab/test results, encounter notes, upcoming appointments, etc.  Non-urgent messages can be sent to your provider as well.   To learn more about what you can do with MyChart, go to  [email or web address].    Your next appointment:   6 month(s)  The format for your next appointment:   In Person  Provider:   You may see Sanda Klein, MD or one of the following Advanced Practice Providers on your designated Care Team:    Almyra [name], PA-C  Fabian Sharp, PA-C or   Roby Lofts, [place]

## 2019-09-09 NOTE — Progress Notes (Signed)
Cardiology Office Note    Date:  09/09/2019   ID:  Rodney Friedman, DOB 1946-05-18, MRN [id number]  PCP:  Wendie Agreste, MD  Cardiologist:   Sanda Klein, MD   chief complaint: CAD, AFib, AS, HTN, HLP   History of Present Illness:  Rodney Friedman is a 74 y.o. male with CAD and remote CABG, paroxysmal atrial fibrillation, mild aortic stenosis, carotid artery stenosis, hypertension, hyperlipidemia and type 2 diabetes mellitus.  Taran's father passed away about a month ago at age 33.  He was very dedicated to his father's care and misses him.  However, his level of stress has gone down tremendously.  He mows his own lawn with a self-propelled push mower, but has to stop every 10 or 15 minutes to catch his breath.  Otherwise denies exertional dyspnea.  The patient specifically denies any chest pain at rest or with exertion, dyspnea at rest or with light exertion, orthopnea, paroxysmal nocturnal dyspnea, syncope, palpitations, focal neurological deficits, intermittent claudication, lower extremity edema, unexplained weight gain, cough, hemoptysis or wheezing.  He is compliant with warfarin anticoagulation follow-up and has not had any falls or serious bleeding problems  He has managed to lose about 4 to 5 pounds recently.  He continues to smoke but has cut back to only 5 cigarettes a day.  He  is well versed in the theoretical aspects of addiction since he has a Masters degree on the topic.  On his current prescription of rosuvastatin and ezetimibe his LDL is excellent at 48, but he still has a low HDL of 29 and mild hypertriglyceridemia.  His most recent hemoglobin A1c was 6.8%.  Blood pressure is usually in the 130/60 range.  He reports that his WBC count is stable to 15-16000 range and he has not required treatment.  He is followed by Dr. Alvy Bimler.  His PSA remains suppressed on Lupron.  He has a history of coronary disease and underwent 3 vessel bypass surgery in [place] in 2006  ("heartburn" and left antecubital pain ) in July 2014 he had an episode of atrial fibrillation. He has significant left ventricular hypertrophy and very mild aortic valve stenosis. He has preserved left ventricular systolic function although there is some degree of anterior hypokinesis by previous echo. His nuclear stress test in June 2013 showed normal pattern of perfusion and an ejection fraction of 52%. He has bilateral carotid bruits that are reportedly due to external carotid artery stenoses. His last carotid ultrasound was performed in September 2017 and showed no significant internal carotid stenosis.  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 He has treated prostate cancer and is followed by Dr. Alvy Bimler.  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 Unfortunately he still smokes roughly 10 cigarettes a day , which he believes he needs due to anxiety.   Past Medical History:  Diagnosis Date  . Adenomatous colon polyp   . Alcoholism ([place])   . Anxiety   . Atrial fibrillation ([place])   . CAD (coronary artery disease)   . Cataract    removed left eye   . CLL (chronic lymphocytic leukemia) ([place]) 07/08/2013  . Depression   . Diabetes mellitus without complication ([place])   . Diverticulosis   . Eye abnormality    Macular scarring R eye  . Glaucoma   . Heart murmur   .  HLD (hyperlipidemia)   . Hypertension   . Leukemia ([place])    CLL  . Myocardial infarction ([place]) 2006  . Prostate cancer ([place]) 07/08/2013   [facility]- getting Lupron shot every 6 months - traces in prostate and 2 lymphnodes left hip- non focused traces per pt   . Sleep apnea   . Substance abuse ([place])   . 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 Arm Surgery Right    from door accident with glass  . CATARACT EXTRACTION W/ INTRAOCULAR LENS  IMPLANT Left   . COLONOSCOPY    . 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 POLYPECTOMY      Current Medications: Outpatient Medications Prior to Visit  Medication Sig Dispense Refill  . amLODipine (NORVASC) 10 MG tablet Take 1 tablet (10 mg total) by mouth daily. 90 tablet 3  . B Complex Vitamins (VITAMIN-B COMPLEX PO) Take by mouth.    Marland Kitchen BIOTIN PO Take by mouth.    Marland Kitchen buPROPion (WELLBUTRIN XL) 300 MG 24 hr tablet 1  qam 90 tablet 2  . Calcium Carbonate-Vit D-Min (CALCIUM 1200 PO) Take 1,200 mg by mouth daily.    Mariane Baumgarten Calcium (STOOL SOFTENER PO) Take 1 tablet as needed by mouth.    . ezetimibe (ZETIA) 10 MG tablet TAKE 1 TABLET BY MOUTH EVERY DAY 90 tablet 3  . gabapentin (NEURONTIN) 100 MG capsule 3  qhs 270 capsule 1  . HYDROcodone-acetaminophen (NORCO/VICODIN) 5-325 MG tablet Take 1 tablet by mouth every 6 (six) hours as needed.    Marland Kitchen ketoconazole (NIZORAL) 2 % shampoo Apply 1 application topically 2 (two) times a week. 120 mL 1  . latanoprost (XALATAN) 0.005 % ophthalmic solution Once a day every second day    . Leuprolide Acetate, 6 Month, (LUPRON) 45 MG injection Inject 45 mg into the muscle every 6 (six) months.    Marland Kitchen LYCOPENE PO Take 8 mg by mouth daily.    . metFORMIN (GLUCOPHAGE) 500 MG tablet Take 1 tablet (500 mg total) by mouth 2 (two) times daily with a meal. 180 tablet 1  . metoprolol (TOPROL-XL) 200 MG 24 hr tablet Take 0.5 tablets (100 mg total) by mouth 2 (two) times daily. Take with or immediately following a meal. 90 tablet 3  . Omega-3 Fatty Acids (FISH OIL) 1000 MG CAPS Take 1,000 mg by mouth daily.    . rosuvastatin (CRESTOR) 20 MG tablet Take 1 tablet (20 mg total) by mouth daily. 90 tablet 3  . valsartan (DIOVAN) 320 MG tablet Take 1 tablet (320 mg total) by mouth daily. 90 tablet 3  . vitamin B-12 (CYANOCOBALAMIN) 500 MCG tablet Take 500 mcg by mouth daily.    . vitamin C (ASCORBIC ACID) 500 MG tablet Take 500 mg by mouth daily.    Marland Kitchen warfarin (COUMADIN) 4  MG tablet TAKE 1/2 TO 1 TABLET DAILY AS DIRECTED BY COUMADIN CLINIC 90 tablet 1  . ezetimibe-simvastatin (VYTORIN) 10-10 MG tablet Take by mouth.     Facility-Administered Medications Prior to Visit  Medication Dose Route Frequency Provider Last Rate Last Admin  . 0.9 %  sodium chloride infusion  500 mL Intravenous Once Doran Stabler, MD         Allergies:   Other   Social History   Socioeconomic History  . Marital status: Divorced    Spouse name: Not on file  . Number of children: 2  .  Years of education: 22  . Highest education level: Not on file  Occupational History  . Occupation: Retired Pharmacist, hospital  Tobacco Use  . Smoking status: Current Every Day Smoker    Packs/day: 0.50    Years: 50.00    Pack years: 25.00    Types: Cigarettes  . Smokeless tobacco: Never Used  Substance and Sexual Activity  . Alcohol use: No    Alcohol/week: 0.0 standard drinks  . Drug use: No  . Sexual activity: Not Currently  Other Topics Concern  . Not on file  Social History Narrative   Lives at home w/ his father   Divorced   Right-handed   Education: Secretary/administrator   No caffeine   Social Determinants of Radio broadcast assistant Strain:   . Difficulty of Paying Living Expenses:   Food Insecurity:   . Worried About Charity fundraiser in the Last Year:   . Arboriculturist in the Last Year:   Transportation Needs:   . Film/video editor (Medical):   Marland Kitchen Lack of Transportation (Non-Medical):   Physical Activity:   . Days of Exercise per Week:   . Minutes of Exercise per Session:   Stress:   . Feeling of Stress :   Social Connections:   . Frequency of Communication with Friends and Family:   . Frequency of Social Gatherings with Friends and Family:   . Attends Religious Services:   . Active Member of Clubs or Organizations:   . Attends Archivist Meetings:   Marland Kitchen Marital Status:      Family History:  The patient's family history includes Colon cancer in his mother;  Hypertension in his father; Ovarian cancer in his mother; Prostate cancer in his father; Uterine cancer in his mother.   ROS:   Please see the history of present illness.    All other systems are reviewed and are negative.   PHYSICAL EXAM:   VS:  BP 140/62   Pulse 61   Temp (!) 96.5 F (35.8 C)   Resp 20   Ht 5\' 9"  (1.753 m)   Wt 218 lb 12.8 oz (99.2 kg)   SpO2 98%   BMI 32.31 kg/m      General: Alert, oriented x3, no distress, mildly obese Head: no evidence of trauma, PERRL, EOMI, no exophtalmos or lid lag, no myxedema, no xanthelasma; normal ears, nose and oropharynx Neck: normal jugular venous pulsations and no hepatojugular reflux; brisk carotid pulses without delay, with a loud bilateral carotid bruits Chest: clear to auscultation, no signs of consolidation by percussion or palpation, normal fremitus, symmetrical and full respiratory excursions Cardiovascular: normal position and quality of the apical impulse, regular rhythm, normal first and second heart sounds, 3/6 early to mid peaking aortic ejection murmur, no diastolic murmurs, rubs or gallops Abdomen: no tenderness or distention, no masses by palpation, no abnormal pulsatility or arterial bruits, normal bowel sounds, no hepatosplenomegaly Extremities: no clubbing, cyanosis or edema; 2+ radial, ulnar and brachial pulses bilaterally; 2+ right femoral, posterior tibial and dorsalis pedis pulses; 2+ left femoral, posterior tibial and dorsalis pedis pulses; no subclavian or femoral bruits Neurological: grossly nonfocal Psych: Normal mood and affect     Wt Readings from Last 3 Encounters:  09/09/19 218 lb 12.8 oz (99.2 kg)  07/22/19 228 lb 9.6 oz (103.7 kg)  04/30/19 229 lb 12.8 oz (104.2 kg)      Studies/Labs Reviewed:   EKG:  EKG is ordered today.  It  shows sinus bradycardia with first-degree AV block at 256 ms, left axis deviation, left ventricular hypertrophy with broadening QRS at 118 ms, no ischemic repolarization  changes, QTC 449 ms. Recent Labs: 04/30/2019: ALT 62; BUN 12; Creatinine, Ser 0.88; Potassium 4.5; Sodium 143 07/22/2019: Hemoglobin 13.5; Platelets 195   Lipid Panel    Component Value Date/Time   CHOL 119 03/13/2019 0914   TRIG 271 (H) 03/13/2019 0914   HDL 29 (L) 03/13/2019 0914   CHOLHDL 4.1 03/13/2019 0914   CHOLHDL 5.8 (H) 03/10/2016 0830   VLDL 41 (H) 03/10/2016 0830   LDLCALC 48 03/13/2019 0914    ASSESSMENT:    1. Paroxysmal atrial fibrillation (HCC)   2. Long term (current) use of anticoagulants   3. Coronary artery disease involving coronary bypass graft of native heart with angina pectoris ([place])   4. Aortic valve stenosis, nonrheumatic   5. Dyslipidemia (high LDL; low HDL)   6. Diabetes mellitus type 2 in obese (HCC)   7. Smoking   8. Bilateral carotid bruits   9. OSA (obstructive sleep apnea)   10. Mild obesity   11. Essential hypertension   12. CLL (chronic lymphocytic leukemia) ([place])   13. Prostate cancer ([place])      PLAN:  In order of problems listed above:  1. AFib: In sinus rhythm today, very mildly bradycardic on the increased dose of metoprolol represcribed due to RVR. CHADSVasc 4 (age, HTN, CAD, DM).   2. Warfarin:  no bleeding complications.  Therapeutic INR today, warfarin dose adjusted. 3. CAD s/p CABG: He does not have angina when mowing the lawn, on 2 antianginal agents (amlodipine and metoprolol). 4. AS: Was mild at last evaluation but this has been well over 6 years ago.  It is time to repeat his echocardiogram.  Murmur is quite prominent.  He would probably be a better candidate for TAVR since he has had previous sternotomy. 5. HLP: LDL in target range, but HDL and triglycerides are still consistent with insulin resistance/metabolic syndrome.  Further weight loss is recommended.  Continue rosuvastatin and ezetimibe. 6. DM: Semicontrolled, most recent hemoglobin A1c 6.9%. 7. Smoking: He has cut back substantially, now only smokes 5 or 6 cigarettes a  day, sounds more like a behavioral addiction than nicotine dependency.  He is proud to report that he has been sober from alcohol and drugs for 18 years and still goes to [employer]. 8. Carotid bruits: Prominent carotid bruits are primarily due to external carotid stenoses.  He does not have any neurological complaints. 9. OSA: He did not tolerate CPAP.  He does not have overt symptoms of daytime hypersomnolence. 10. Obesity: He's lost about 10 pounds since his peak weight last winter.  Remains mildly obese. 11. HTN: Systolic blood pressure is mildly elevated today, but usually substantially lower at home.  Diastolic blood pressure tends to run fairly low.  No medication changes today. 12. CLL: Stable white blood cell count around 15-16000, not yet requiring treatment. No anemia or thrombocytopenia.  Followed by Dr. Alvy Bimler. 13. Prostate Ca: PSA undetectable on current medications.    Medication Adjustments/Labs and Tests Ordered: Current medicines are reviewed at length with the patient today.  Concerns regarding medicines are outlined above.  Medication changes, Labs and Tests ordered today are listed in the Patient Instructions below. Patient Instructions  Medication Instructions:  No changes *If you need a refill on your cardiac medications before your next appointment, please call your pharmacy*   Lab Work: None ordered If  you have labs (blood work) drawn today and your tests are completely normal, you will receive your results only by: Marland Kitchen MyChart Message (if you have MyChart) OR . A paper copy in the mail If you have any lab test that is abnormal or we need to change your treatment, we will call you to review the results.   Testing/Procedures: Your physician has requested that you have an echocardiogram in 3 months. Echocardiography is a painless test that uses sound waves to create images of your heart. It provides your doctor with information about the size and shape of your heart  and how well your heart's chambers and valves are working. You may receive an ultrasound enhancing agent through an IV if needed to better visualize your heart during the echo.This procedure takes approximately one hour. There are no restrictions for this procedure. This will take place at the [address], [address].     Follow-Up: At [facility], you and your health needs are our priority.  As part of our continuing mission to provide you with exceptional heart care, we have created designated Provider Care Teams.  These Care Teams include your primary Cardiologist (physician) and Advanced Practice Providers (APPs -  Physician Assistants and Nurse Practitioners) who all work together to provide you with the care you need, when you need it.  We recommend signing up for the patient portal called "MyChart".  Sign up information is provided on this After Visit Summary.  MyChart is used to connect with patients for Virtual Visits (Telemedicine).  Patients are able to view lab/test results, encounter notes, upcoming appointments, etc.  Non-urgent messages can be sent to your provider as well.   To learn more about what you can do with MyChart, go to [email or web address].    Your next appointment:   6 month(s)  The format for your next appointment:   In Person  Provider:   You may see Sanda Klein, MD or one of the following Advanced Practice Providers on your designated Care Team:    Almyra [name], PA-C  Fabian Sharp, [place] or   Roby Lofts, PA-C       Signed, Sanda Klein, MD  09/09/2019 [facility] [place], [address], [place]  [address] Phone: [phone]; Fax: [phone]

## 2019-09-16 ENCOUNTER — Ambulatory Visit (INDEPENDENT_AMBULATORY_CARE_PROVIDER_SITE_OTHER): Payer: Medicare Other | Admitting: Licensed Clinical Social Worker

## 2019-09-16 ENCOUNTER — Encounter (HOSPITAL_COMMUNITY): Payer: Self-pay | Admitting: Licensed Clinical Social Worker

## 2019-09-16 ENCOUNTER — Other Ambulatory Visit: Payer: Self-pay

## 2019-09-16 DIAGNOSIS — F331 Major depressive disorder, recurrent, moderate: Secondary | ICD-10-CM | POA: Diagnosis not present

## 2019-09-16 DIAGNOSIS — F411 Generalized anxiety disorder: Secondary | ICD-10-CM | POA: Diagnosis not present

## 2019-09-16 NOTE — Progress Notes (Signed)
   THERAPIST PROGRESS NOTE  Session Time: 9:00am-10:00am  Participation Level: Active  Behavioral Response: Well GroomedAlertDepressed  Type of Therapy: Individual Therapy  Treatment Goals addressed:  "to cope with stress and manage depression and anxiety". [name] will report improved stress management and coping skills, resulting in mood stability and less worrying 4 out of 7 days.    Interventions: Motivational Interviewing    Summary: Rodney Friedman is a 74 y.o. male who presents with Major Depressive Disorder, moderate and Generalized Anxiety Disorder   Suicidal/Homicidal: No without intent/plan   Therapist Response:   [name] met with clinician for individual therapy. [name] discussed his psychiatric symptoms and current life events. [name] identified ongoing grief and sadness about the loss of his father. He reported missing him, thinking about him a lot, and wishing they were together. Clinician processed thoughts and feelings using MI OARS. Clinician reflected these appropriate and expected thoughts and feelings and encouraged [name] to allow the process to continue. Clinician provided time and space for [name] to explain his faith tradition as related to death and grief. Clinician identified the importance of the memories and telling the stories to keep dad alive in his thoughts.  [name] reports he is trying to stay busy, maintaining his involvement in NA and AA. Clinician reflected the importance of having things on the calendar and having a reason to leave the house daily.    Plan: Return again in 2 weeks.   Diagnosis: Axis I: Major depressive disorder, moderate and Generalized Anxiety Disorder       [name], LCSW 09/16/2019

## 2019-09-17 ENCOUNTER — Ambulatory Visit (HOSPITAL_COMMUNITY): Payer: Medicare Other | Admitting: Licensed Clinical Social Worker

## 2019-09-17 DIAGNOSIS — Z23 Encounter for immunization: Secondary | ICD-10-CM | POA: Diagnosis not present

## 2019-10-06 ENCOUNTER — Other Ambulatory Visit: Payer: Self-pay | Admitting: Pharmacist Clinician (PhC)/ Clinical Pharmacy Specialist

## 2019-10-06 ENCOUNTER — Encounter: Payer: Self-pay | Admitting: Hematology and Oncology

## 2019-10-06 ENCOUNTER — Encounter: Payer: Self-pay | Admitting: Family Medicine

## 2019-10-06 DIAGNOSIS — Z7901 Long term (current) use of anticoagulants: Secondary | ICD-10-CM

## 2019-10-06 DIAGNOSIS — I482 Chronic atrial fibrillation, unspecified: Secondary | ICD-10-CM

## 2019-10-06 NOTE — Telephone Encounter (Signed)
Updating INR orders

## 2019-10-08 ENCOUNTER — Telehealth: Payer: Self-pay | Admitting: Licensed Clinical Social Worker

## 2019-10-08 NOTE — Telephone Encounter (Signed)
CSW referred to assist patient with Advanced Directive. CSW contacted patient who states he completed his HPOA and plans to bring to office visit tomorrow to be scanned in his medical record. Patient denies any other concerns at this time. Raquel Sarna, [place], [place]

## 2019-10-09 ENCOUNTER — Ambulatory Visit (HOSPITAL_COMMUNITY): Payer: Medicare Other | Attending: Cardiovascular Disease

## 2019-10-09 ENCOUNTER — Ambulatory Visit (INDEPENDENT_AMBULATORY_CARE_PROVIDER_SITE_OTHER): Payer: Medicare Other

## 2019-10-09 ENCOUNTER — Other Ambulatory Visit: Payer: Self-pay

## 2019-10-09 DIAGNOSIS — I350 Nonrheumatic aortic (valve) stenosis: Secondary | ICD-10-CM | POA: Diagnosis not present

## 2019-10-09 DIAGNOSIS — Z7901 Long term (current) use of anticoagulants: Secondary | ICD-10-CM | POA: Diagnosis not present

## 2019-10-09 DIAGNOSIS — I482 Chronic atrial fibrillation, unspecified: Secondary | ICD-10-CM

## 2019-10-09 LAB — POCT INR: INR: 2.3 (ref 2.0–3.0)

## 2019-10-09 MED ORDER — PERFLUTREN LIPID MICROSPHERE
1.0000 mL | INTRAVENOUS | Status: AC | PRN
  Administered 2019-10-09: 2 mL via INTRAVENOUS

## 2019-10-09 NOTE — Patient Instructions (Signed)
Description   Continue on same dosage 1 tablet daily except 1/2 tablet on Tuesdays and Fridays. Repeat INR in 8 weeks.

## 2019-10-14 ENCOUNTER — Other Ambulatory Visit: Payer: Self-pay

## 2019-10-14 ENCOUNTER — Ambulatory Visit (INDEPENDENT_AMBULATORY_CARE_PROVIDER_SITE_OTHER): Payer: Medicare Other | Admitting: Licensed Clinical Social Worker

## 2019-10-14 ENCOUNTER — Encounter (HOSPITAL_COMMUNITY): Payer: Self-pay | Admitting: Licensed Clinical Social Worker

## 2019-10-14 DIAGNOSIS — F411 Generalized anxiety disorder: Secondary | ICD-10-CM | POA: Diagnosis not present

## 2019-10-14 DIAGNOSIS — F331 Major depressive disorder, recurrent, moderate: Secondary | ICD-10-CM | POA: Diagnosis not present

## 2019-10-14 NOTE — Progress Notes (Signed)
   THERAPIST PROGRESS NOTE  Session Time: 8:00am-8:50am  Participation Level: Active  Behavioral Response: Well GroomedAlertDepressed and Irritable  Type of Therapy: Individual Therapy   Treatment Goals addressed:  "to cope with stress and manage depression and anxiety". [name] will report improved stress management and coping skills, resulting in mood stability and less worrying 4 out of 7 days.    Interventions: Motivational Interviewing    Summary: Rodney Friedman is a 74 y.o. male who presents with Major Depressive Disorder, moderate and Generalized Anxiety Disorder   Suicidal/Homicidal: No without intent/plan   Therapist Response:   Rodney Friedman met with clinician for individual therapy. Rodney Friedman discussed his psychiatric symptoms and current life events. [name] reports he continues to feel depressed, anxious, and irritable following the loss of his father 2 months ago. Clinician utilized MI OARS to reflect and summarize thoughts and feelings. Clinician provided time and space for [name] to recount the tasks he has completed, as well as to process several "flashbacks" he's had about the past. Clinician utilized active listening and reflections to note the care he took of both parents, and his ongoing care he has taken of his children and grandchildren. Clinician processed [name]'s concerns about his health, including eye, heart, and prostate issues. Clinician discussed coping skills, normalized stress reactions, and identified the importance of reaching out to support system.    Plan: Return again in 2 weeks.   Diagnosis: Axis I: Major depressive disorder, moderate and Generalized Anxiety Disorder     [name], LCSW 10/14/2019

## 2019-10-17 ENCOUNTER — Other Ambulatory Visit: Payer: Self-pay | Admitting: Family Medicine

## 2019-10-17 DIAGNOSIS — I10 Essential (primary) hypertension: Secondary | ICD-10-CM

## 2019-10-27 ENCOUNTER — Other Ambulatory Visit: Payer: Self-pay

## 2019-10-27 ENCOUNTER — Ambulatory Visit (INDEPENDENT_AMBULATORY_CARE_PROVIDER_SITE_OTHER): Payer: Medicare Other | Admitting: Family Medicine

## 2019-10-27 ENCOUNTER — Encounter: Payer: Self-pay | Admitting: Family Medicine

## 2019-10-27 ENCOUNTER — Ambulatory Visit: Payer: Medicare Other | Admitting: Family Medicine

## 2019-10-27 VITALS — BP 139/71 | HR 60 | Temp 98.0°F | Ht 69.0 in | Wt 219.0 lb

## 2019-10-27 DIAGNOSIS — I10 Essential (primary) hypertension: Secondary | ICD-10-CM | POA: Diagnosis not present

## 2019-10-27 DIAGNOSIS — E1169 Type 2 diabetes mellitus with other specified complication: Secondary | ICD-10-CM | POA: Diagnosis not present

## 2019-10-27 DIAGNOSIS — I25709 Atherosclerosis of coronary artery bypass graft(s), unspecified, with unspecified angina pectoris: Secondary | ICD-10-CM | POA: Diagnosis not present

## 2019-10-27 DIAGNOSIS — E785 Hyperlipidemia, unspecified: Secondary | ICD-10-CM

## 2019-10-27 DIAGNOSIS — E119 Type 2 diabetes mellitus without complications: Secondary | ICD-10-CM | POA: Diagnosis not present

## 2019-10-27 DIAGNOSIS — C9110 Chronic lymphocytic leukemia of B-cell type not having achieved remission: Secondary | ICD-10-CM | POA: Diagnosis not present

## 2019-10-27 MED ORDER — AMLODIPINE BESYLATE 10 MG PO TABS: 10.0000 mg | ORAL_TABLET | Freq: Every day | ORAL | 3 refills | Status: DC

## 2019-10-27 NOTE — Patient Instructions (Addendum)
    The Office of Patient Experience can be reached by phone at [phone].  If you check your blood pressures outside of the office and they run over 140/90, let me know.  No med changes for now.  Thanks for coming in today and take care.    If you have lab work done today you will be contacted with your lab results within the next 2 weeks.  If you have not heard from [place] then please contact us. The fastest way to get your results is to register for My Chart.   IF you received an x-ray today, you will receive an invoice from [facility]. Please contact [facility] at [phone] with questions or concerns regarding your invoice.   IF you received labwork today, you will receive an invoice from [facility]. Please contact [facility] at [phone] with questions or concerns regarding your invoice.   Our billing staff will not be able to assist you with questions regarding bills from these companies.  You will be contacted with the lab results as soon as they are available. The fastest way to get your results is to activate your My Chart account. Instructions are located on the last page of this paperwork. If you have not heard from [place] regarding the results in 2 weeks, please contact this office.

## 2019-10-27 NOTE — Progress Notes (Signed)
Subjective:  Patient ID: Rodney Friedman, male    DOB: 09/14/1945  Age: 74 y.o. MRN: [id number]  CC:  Chief Complaint  Patient presents with  . Follow-up    pt is here for a 6 month follow up and med review. pt reports medications seem to be working properly with no known sdide effects. pt takes his medication as prescribed. pt reports no issues with his diabetes since last visit, and no issues with his hypertension since last visit either.  . Depression    PHQ-9 score of 13    HPI Rodney Friedman presents for   Hypertension: Followed by cardiology with history of CAD, aortic valve stenosis, paroxysmal A. Fib, on chronic anticoagulation, followed by cardiology for use of anticoagulant.  Dr. Sallyanne Kuster. Ongoing follow up and imaging with aortic valve.  Amlodipine 10 mg daily, Toprol 200 mg daily, Diovan 320 mg daily Home readings: none.  BP Readings from Last 3 Encounters:  10/27/19 139/71  09/09/19 140/62  07/22/19 (!) 147/45   Lab Results  Component Value Date   CREATININE 0.88 04/30/2019   Hyperlipidemia: Crestor 20 mg daily, zetia 10mg  qd. No new side effects.  Lab Results  Component Value Date   CHOL 119 03/13/2019   HDL 29 (L) 03/13/2019   LDLCALC 48 03/13/2019   TRIG 271 (H) 03/13/2019   CHOLHDL 4.1 03/13/2019   Lab Results  Component Value Date   ALT 62 (H) 04/30/2019   AST 48 (H) 04/30/2019   ALKPHOS 55 04/30/2019   BILITOT 0.3 04/30/2019    Diabetes: Complicated by history of CAD, hyperglycemia prior with A1c 7.3 in June, improved to 6.9 in December.  He is on statin and ARB.  Currently treated with Metformin 500 daily prior - has increased to BID past 6 months. No new side effects.  No home readings.  Microalbumin: Normal ratio 11/01/2018 Optho, foot exam, pneumovax:  Ophthalmology exam: appt 6/28.  Cataract on right - may be considering repair, scarred macula.  Had both covid vaccines.   Lab Results  Component Value Date   HGBA1C 6.9 (H) 04/30/2019   HGBA1C 7.3 (H) 10/30/2018   HGBA1C 6.3 (H) 05/03/2018   Lab Results  Component Value Date   MICROALBUR 1.9 10/08/2014   LDLCALC 48 03/13/2019   CREATININE 0.88 04/30/2019    Depression: Treated by psychiatry, Dr. Casimiro Needle.  Therapist Carlus Pavlov.  Denies suicidal ideation. Father passed away. Since last visit. Crying daily. Still trying to adjust.  Has lost some weight. Some decreased appetite, but eating nutritious food.  Meeting with therapist has been helpful.   Depression screen [facility] 2/9 10/27/2019 04/30/2019 04/07/2019 10/30/2018 05/03/2018  Decreased Interest 3 0 2 0 0  Down, Depressed, Hopeless 3 0 2 0 3  PHQ - 2 Score 6 0 4 0 3  Altered sleeping 0 - 0 - 0  Tired, decreased energy 2 - 0 - 3  Change in appetite 2 - 0 - 0  Feeling bad or failure about yourself  3 - 0 - 3  Trouble concentrating 0 - 0 - 0  Moving slowly or fidgety/restless 0 - 0 - 0  Suicidal thoughts 0 - 0 - 0  PHQ-9 Score 13 - 4 - 9  Difficult doing work/chores - - Not difficult at all - Somewhat difficult  Some recent data might be hidden   CLL: Saw Dr. Alvy Bimler in March. No evidence of disease progression.  Lab Results  Component Value Date   WBC 16.3 (H) 07/22/2019  HGB 13.5 07/22/2019   HCT 39.1 07/22/2019   MCV 89.1 07/22/2019   PLT 195 07/22/2019   ON Lupron for prostate CA, negligible PSA 4 months ago, treated with Lupron,  appt in August.   History Patient Active Problem List   Diagnosis Date Noted  . Chronic pain 03/14/2019  . Other social stressor 07/27/2018  . Essential hypertension 07/27/2018  . Diabetes mellitus type 2 in obese ([place]) 07/27/2018  . Paroxysmal atrial fibrillation ([place]) 04/21/2016  . Diabetes mellitus ([place]) 04/21/2016  . OSA (obstructive sleep apnea) 04/21/2016  . Quality of life palliative care encounter 01/03/2016  . Bilateral carotid bruits without stenosis 01/02/2016  . Tremor, essential 09/22/2015  . Glaucoma, open angle 06/16/2015  . Primary open angle  glaucoma 06/15/2015  . Pseudoaphakia 06/15/2015  . CD (contact dermatitis) 04/29/2014  . 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 Hematuria 01/05/2014  . Skin lesion 01/05/2014  . Aortic valve stenosis, nonrheumatic 01/03/2014  . Hypertensive heart disease 01/03/2014  . Mixed hyperlipidemia 01/03/2014  . Mild obesity 01/03/2014  . Tobacco abuse 01/03/2014  . Chronic atrial fibrillation ([place]) 12/31/2013  . Long term (current) use of anticoagulants 12/31/2013  . Cataract, nuclear 10/06/2013  . Dermatochalasis of eyelid 10/06/2013  . 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 Coronary artery disease of native artery of native heart with stable angina pectoris ([place]) 09/03/2013  . Pre-diabetes 09/03/2013  . CLL (chronic lymphocytic leukemia) ([place]) 07/08/2013  . Prostate cancer ([place]) 07/08/2013   Past Medical History:  Diagnosis Date  . Adenomatous colon polyp   . Alcoholism ([place])   . Anxiety   . Atrial fibrillation ([place])   . CAD (coronary artery disease)   . Cataract    removed left eye   . 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 Depression   . Diabetes mellitus without complication ([place])   . Diverticulosis   . Eye abnormality    Macular scarring R eye  . Glaucoma   . Heart murmur   . HLD (hyperlipidemia)   . Hypertension   . Leukemia ([place])    CLL  . Myocardial infarction ([place]) 2006  . Prostate cancer ([place]) 07/08/2013   [facility]- getting Lupron shot every 6 months - traces in prostate and 2 lymphnodes left hip- non focused traces per pt   . Sleep apnea   . Substance abuse ([place])   . 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 Arm Surgery Right    from door accident with glass  . CATARACT EXTRACTION W/ INTRAOCULAR LENS IMPLANT Left   . COLONOSCOPY    . 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 POLYPECTOMY     Allergies  Allergen Reactions  . Other Rash    Allergen: "Plants and bushes while  doing yard work"   Prior to Admission medications   Medication Sig Start Date End Date Taking? Authorizing Provider  amLODipine (NORVASC) 10 MG tablet Take 1 tablet (10 mg total) by mouth daily. 03/13/19  Yes Croitoru, Mihai, MD  B Complex Vitamins (VITAMIN-B COMPLEX PO) Take by mouth.   Yes [provider]  BIOTIN PO Take by mouth.   Yes [provider]  buPROPion (WELLBUTRIN XL) 300 MG 24 hr tablet 1  qam 08/01/19  Yes Plovsky, Berneta Sages, MD  Calcium Carbonate-Vit D-Min (CALCIUM 1200 PO) Take 1,200 mg by mouth daily.   Yes [provider]  Docusate Calcium (STOOL SOFTENER PO) Take 1 tablet as needed  by mouth.   Yes [provider]  ezetimibe (ZETIA) 10 MG tablet TAKE 1 TABLET BY MOUTH EVERY DAY 07/29/19  Yes Croitoru, Mihai, MD  gabapentin (NEURONTIN) 100 MG capsule 3  qhs 08/01/19  Yes Plovsky, Berneta Sages, MD  HYDROcodone-acetaminophen (NORCO/VICODIN) 5-325 MG tablet Take 1 tablet by mouth every 6 (six) hours as needed. 05/24/19  Yes [provider]  ketoconazole (NIZORAL) 2 % shampoo Apply 1 application topically 2 (two) times a week. 06/18/17  Yes Wendie Agreste, MD  latanoprost (XALATAN) 0.005 % ophthalmic solution Once a day every second day 06/30/13  Yes [provider]  Leuprolide Acetate, 6 Month, (LUPRON) 45 MG injection Inject 45 mg into the muscle every 6 (six) months.   Yes [provider]  LYCOPENE PO Take 8 mg by mouth daily.   Yes [provider]  metFORMIN (GLUCOPHAGE) 500 MG tablet Take 1 tablet (500 mg total) by mouth 2 (two) times daily with a meal. 07/22/19  Yes Gorsuch, Ni, MD  metoprolol (TOPROL-XL) 200 MG 24 hr tablet Take 0.5 tablets (100 mg total) by mouth 2 (two) times daily. Take with or immediately following a meal. 03/13/19  Yes Croitoru, Mihai, MD  Omega-3 Fatty Acids (FISH OIL) 1000 MG CAPS Take 1,000 mg by mouth daily.   Yes [provider]  rosuvastatin (CRESTOR) 20 MG tablet Take 1 tablet (20 mg  total) by mouth daily. 06/09/19  Yes Croitoru, Mihai, MD  valsartan (DIOVAN) 320 MG tablet Take 1 tablet (320 mg total) by mouth daily. 03/13/19  Yes Croitoru, Mihai, MD  vitamin B-12 (CYANOCOBALAMIN) 500 MCG tablet Take 500 mcg by mouth daily.   Yes [provider]  vitamin C (ASCORBIC ACID) 500 MG tablet Take 500 mg by mouth daily.   Yes [provider]  warfarin (COUMADIN) 4 MG tablet TAKE 1/2 TO 1 TABLET DAILY AS DIRECTED BY COUMADIN CLINIC 06/09/19  Yes Croitoru, Dani Gobble, MD   Social History   Socioeconomic History  . Marital status: Divorced    Spouse name: Not on file  . Number of children: 2  . Years of education: 68  . Highest education level: Not on file  Occupational History  . Occupation: Retired Pharmacist, hospital  Tobacco Use  . Smoking status: Current Every Day Smoker    Packs/day: 0.50    Years: 50.00    Pack years: 25.00    Types: Cigarettes  . Smokeless tobacco: Never Used  Substance and Sexual Activity  . Alcohol use: No    Alcohol/week: 0.0 standard drinks  . Drug use: No  . Sexual activity: Not Currently  Other Topics Concern  . Not on file  Social History Narrative   Lives at home w/ his father   Divorced   Right-handed   Education: Secretary/administrator   No caffeine   Social Determinants of Radio broadcast assistant Strain:   . Difficulty of Paying Living Expenses:   Food Insecurity:   . Worried About Charity fundraiser in the Last Year:   . Arboriculturist in the Last Year:   Transportation Needs:   . Film/video editor (Medical):   Marland Kitchen Lack of Transportation (Non-Medical):   Physical Activity:   . Days of Exercise per Week:   . Minutes of Exercise per Session:   Stress:   . Feeling of Stress :   Social Connections:   . Frequency of Communication with Friends and Family:   . Frequency of Social Gatherings with Friends  and Family:   . Attends Religious Services:   . Active Member of Clubs or Organizations:   . Attends Archivist  Meetings:   Marland Kitchen Marital Status:   Intimate Partner Violence:   . Fear of Current or Ex-Partner:   . Emotionally Abused:   Marland Kitchen Physically Abused:   . Sexually Abused:     Review of Systems  Constitutional: Negative for fatigue and unexpected weight change.  Eyes: Negative for visual disturbance.  Respiratory: Positive for shortness of breath (chronic. ). Negative for cough and chest tightness.   Cardiovascular: Negative for chest pain, palpitations and leg swelling.  Gastrointestinal: Negative for abdominal pain and blood in stool.  Neurological: Negative for dizziness, light-headedness and headaches.     Objective:   Vitals:   10/27/19 0807  BP: 139/71  Pulse: 60  Temp: 98 F (36.7 C)  TempSrc: Temporal  SpO2: 98%  Weight: 219 lb (99.3 kg)  Height: 5\' 9"  (1.753 m)     Physical Exam Vitals reviewed.  Constitutional:      Appearance: He is well-developed.  HENT:     Head: Normocephalic and atraumatic.  Eyes:     Pupils: Pupils are equal, round, and reactive to light.  Neck:     Vascular: No carotid bruit or JVD.  Cardiovascular:     Rate and Rhythm: Normal rate and regular rhythm.     Heart sounds: Murmur (3/6 systolic) present.  Pulmonary:     Effort: Pulmonary effort is normal.     Breath sounds: Normal breath sounds. No rales.  Skin:    General: Skin is warm and dry.  Neurological:     Mental Status: He is alert and oriented to person, place, and time.  Psychiatric:        Attention and Perception: Attention normal.        Mood and Affect: Mood is depressed.        Speech: Speech normal.        Behavior: Behavior normal.        Thought Content: Thought content normal.        Assessment & Plan:  Rodney Friedman is a 74 y.o. male . Type 2 diabetes mellitus with other specified complication, without long-term current use of insulin (HCC) - Plan: Microalbumin / creatinine urine ratio, Hemoglobin A1c  -Improved control previously.  Check A1c, urine  microalbumin, continue Metformin same dose.  Essential hypertension - Plan: amLODipine (NORVASC) 10 MG tablet, Comprehensive metabolic panel  -Borderline but stable.  Monitoring discussed with RTC precautions.  Continue same regimen, amlodipine was refilled.  CLL (chronic lymphocytic leukemia) ([place]) - Plan: CBC  -Followed by hematology but he requested updated CBC.  Continue routine follow-up with hematology as well as with urology for prostate cancer.  PSA deferred as he just had one done approximately 4 months ago, has appointment in August.  Hyperlipidemia, unspecified hyperlipidemia type - Plan: Lipid panel  -Tolerating Crestor and Zetia.  Repeat labs  Major depressive disorder: Followed by psychiatry and counselor.  Complicated with loss of his father earlier this year.  Does have insight that things will get better but is still adjusting to this loss.  Continue follow-up with psychiatry, counseling.  Condolences again given.  Meds ordered this encounter  Medications  . amLODipine (NORVASC) 10 MG tablet    Sig: Take 1 tablet (10 mg total) by mouth daily.    Dispense:  90 tablet    Refill:  3   Patient Instructions  The Office of Patient Experience can be reached by phone at [phone].  If you check your blood pressures outside of the office and they run over 140/90, let me know.  No med changes for now.  Thanks for coming in today and take care.    If you have lab work done today you will be contacted with your lab results within the next 2 weeks.  If you have not heard from [place] then please contact us. The fastest way to get your results is to register for My Chart.   IF you received an x-ray today, you will receive an invoice from [facility]. Please contact [facility] at [phone] with questions or concerns regarding your invoice.   IF you received labwork today, you will receive an invoice from [facility]. Please contact [facility] at [phone]  with questions or concerns regarding your invoice.   Our billing staff will not be able to assist you with questions regarding bills from these companies.  You will be contacted with the lab results as soon as they are available. The fastest way to get your results is to activate your My Chart account. Instructions are located on the last page of this paperwork. If you have not heard from [place] regarding the results in 2 weeks, please contact this office.         Signed, [name], MD [facility]

## 2019-10-28 ENCOUNTER — Encounter (HOSPITAL_COMMUNITY): Payer: Self-pay | Admitting: Licensed Clinical Social Worker

## 2019-10-28 ENCOUNTER — Ambulatory Visit (INDEPENDENT_AMBULATORY_CARE_PROVIDER_SITE_OTHER): Payer: Medicare Other | Admitting: Licensed Clinical Social Worker

## 2019-10-28 DIAGNOSIS — F411 Generalized anxiety disorder: Secondary | ICD-10-CM

## 2019-10-28 DIAGNOSIS — F331 Major depressive disorder, recurrent, moderate: Secondary | ICD-10-CM

## 2019-10-28 LAB — MICROALBUMIN / CREATININE URINE RATIO
Creatinine, Urine: 183.6 mg/dL
Microalb/Creat Ratio: 17 mg/g creat (ref 0–29)
Microalbumin, Urine: 30.7 ug/mL

## 2019-10-28 LAB — COMPREHENSIVE METABOLIC PANEL
ALT: 47 IU/L — ABNORMAL HIGH (ref 0–44)
AST: 34 IU/L (ref 0–40)
Albumin/Globulin Ratio: 2.2 (ref 1.2–2.2)
Albumin: 4.2 g/dL (ref 3.7–4.7)
Alkaline Phosphatase: 43 IU/L — ABNORMAL LOW (ref 48–121)
BUN/Creatinine Ratio: 14 (ref 10–24)
BUN: 13 mg/dL (ref 8–27)
Bilirubin Total: 0.2 mg/dL (ref 0.0–1.2)
CO2: 21 mmol/L (ref 20–29)
Calcium: 9.4 mg/dL (ref 8.6–10.2)
Chloride: 110 mmol/L — ABNORMAL HIGH (ref 96–106)
Creatinine, Ser: 0.9 mg/dL (ref 0.76–1.27)
GFR calc Af Amer: 99 mL/min/{1.73_m2} (ref >59)
GFR calc non Af Amer: 86 mL/min/{1.73_m2} (ref >59)
Globulin, Total: 1.9 g/dL (ref 1.5–4.5)
Glucose: 127 mg/dL — ABNORMAL HIGH (ref 65–99)
Potassium: 4.4 mmol/L (ref 3.5–5.2)
Sodium: 144 mmol/L (ref 134–144)
Total Protein: 6.1 g/dL (ref 6.0–8.5)

## 2019-10-28 LAB — HEMOGLOBIN A1C
Est. average glucose Bld gHb Est-mCnc: 131 mg/dL
Hgb A1c MFr Bld: 6.2 % — ABNORMAL HIGH (ref 4.8–5.6)

## 2019-10-28 LAB — CBC
Hematocrit: 38.2 % (ref 37.5–51.0)
Hemoglobin: 13.1 g/dL (ref 13.0–17.7)
MCH: 31.6 pg (ref 26.6–33.0)
MCHC: 34.3 g/dL (ref 31.5–35.7)
MCV: 92 fL (ref 79–97)
Platelets: 211 10*3/uL (ref 150–450)
RBC: 4.15 x10E6/uL (ref 4.14–5.80)
RDW: 13.7 % (ref 11.6–15.4)
WBC: 16.3 10*3/uL — ABNORMAL HIGH (ref 3.4–10.8)

## 2019-10-28 LAB — LIPID PANEL
Chol/HDL Ratio: 3.2 ratio (ref 0.0–5.0)
Cholesterol, Total: 123 mg/dL (ref 100–199)
HDL: 39 mg/dL — ABNORMAL LOW (ref >39)
LDL Chol Calc (NIH): 57 mg/dL (ref 0–99)
Triglycerides: 158 mg/dL — ABNORMAL HIGH (ref 0–149)
VLDL Cholesterol Cal: 27 mg/dL (ref 5–40)

## 2019-10-28 NOTE — Progress Notes (Signed)
   THERAPIST PROGRESS NOTE  Session Time: 9:00am-9:50am  Participation Level: Active  Behavioral Response: NeatAlertDepressed  Type of Therapy: Individual Therapy   Treatment Goals addressed:  "to cope with stress and manage depression and anxiety". [name] will report improved stress management and coping skills, resulting in mood stability and less worrying 4 out of 7 days.    Interventions: Motivational Interviewing    Summary: Rodney Friedman is a 74 y.o. male who presents with Major Depressive Disorder, moderate and Generalized Anxiety Disorder   Suicidal/Homicidal: No without intent/plan   Therapist Response:   [name] met with clinician for individual therapy. [name] discussed his psychiatric symptoms and current life events. [name] reports he has been doing a little better coping with his father's death. Clinician utilized MI OARS to reflect and summarize thoughts and feelings. Clinician also validated the experience of grief. Clinician identified the importance of allowing the grief process to continue. Clinician processed the importance of focus on the living- his children, grandchildren, their futures, and his purpose in their lives. Clinician provided time for [name] to identify his role, both emotionally and financially for his children and grandchildren.    Plan: Return again in 2 weeks.   Diagnosis: Axis I: Major depressive disorder, moderate and Generalized Anxiety Disorder  [name], LCSW 10/28/2019

## 2019-11-11 ENCOUNTER — Other Ambulatory Visit: Payer: Self-pay

## 2019-11-11 ENCOUNTER — Ambulatory Visit (INDEPENDENT_AMBULATORY_CARE_PROVIDER_SITE_OTHER): Payer: Medicare Other | Admitting: Licensed Clinical Social Worker

## 2019-11-11 ENCOUNTER — Encounter (HOSPITAL_COMMUNITY): Payer: Self-pay | Admitting: Licensed Clinical Social Worker

## 2019-11-11 DIAGNOSIS — F331 Major depressive disorder, recurrent, moderate: Secondary | ICD-10-CM

## 2019-11-11 DIAGNOSIS — F411 Generalized anxiety disorder: Secondary | ICD-10-CM

## 2019-11-11 NOTE — Progress Notes (Signed)
   THERAPIST PROGRESS NOTE  Session Time: 9:00am-9:50am  Participation Level: Active  Behavioral Response: NeatAlertDepressed  Type of Therapy: Individual Therapy   Treatment Goals addressed:  "to cope with stress and manage depression and anxiety". [name] will report improved stress management and coping skills, resulting in mood stability and less worrying 4 out of 7 days.    Interventions: Motivational Interviewing    Summary: Rodney Friedman is a 74 y.o. male who presents with Major Depressive Disorder, moderate and Generalized Anxiety Disorder   Suicidal/Homicidal: No without intent/plan   Therapist Response:   [name] met with clinician for individual therapy. [name] discussed his psychiatric symptoms and current life events. [name] reports he had a very disappointing birthday and Father's Day. Clinician utilized MI OARS to reflect and summarize thoughts and feelings about interactions with son on those days. Clinician provided time and space for [name] to express emotions and to work out his expectations that were unmet, as well as the reality of his son's life. Clinician processed grief for father, as this was the first Father's Day without him. [name] reported that he completed the art project box for father's ashes and he felt his father's presence and satisfaction with his work. [name] also reported that he sent ashes to daughter in [place] to be spread where his mother's ashes were put. Clinician reflected the peace and beauty of that experience. Clinician validated and reassured the excellent care he took of his father, which was also validated by father's continuing health until the end.    Plan: Return again in 2 weeks.   Diagnosis: Axis I: Major depressive disorder, moderate and Generalized Anxiety Disorder     [name], LCSW 11/11/2019

## 2019-11-25 ENCOUNTER — Other Ambulatory Visit: Payer: Self-pay

## 2019-11-25 ENCOUNTER — Encounter (HOSPITAL_COMMUNITY): Payer: Self-pay | Admitting: Licensed Clinical Social Worker

## 2019-11-25 ENCOUNTER — Ambulatory Visit (INDEPENDENT_AMBULATORY_CARE_PROVIDER_SITE_OTHER): Payer: Medicare Other | Admitting: Licensed Clinical Social Worker

## 2019-11-25 DIAGNOSIS — F411 Generalized anxiety disorder: Secondary | ICD-10-CM | POA: Diagnosis not present

## 2019-11-25 DIAGNOSIS — F331 Major depressive disorder, recurrent, moderate: Secondary | ICD-10-CM

## 2019-11-25 NOTE — Progress Notes (Signed)
   THERAPIST PROGRESS NOTE  Session Time: 9:00am-10:00am  Participation Level: Active  Behavioral Response: Well GroomedAlertDepressed  Type of Therapy: Individual Therapy   Treatment Goals addressed:  "to cope with stress and manage depression and anxiety". Rodney Friedman will report improved stress management and coping skills, resulting in mood stability and less worrying 4 out of 7 days.    Interventions: Motivational Interviewing    Summary: Rodney Friedman is a 73 y.o. male who presents with Major Depressive Disorder, moderate and Generalized Anxiety Disorder   Suicidal/Homicidal: No without intent/plan   Therapist Response:   [name] met with clinician for individual therapy. Rodney Friedman discussed his psychiatric symptoms and current life events. Rodney Friedman reports he continues to experience grief and depression. Rodney Friedman processed issues with primary care doctor. Clinician utilized MI OARS to reflect and summarize thoughts and feelings about his health concerns. Clinician validated Rodney Friedman's experience and reminded him about the importance of being an active participant in his health care. 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 Rodney Friedman processed ongoing guilt/resentment of mother for her treatment of him as a child. Clinician reflected these feelings and provided insight into mother's own experience of being raised by a single parent in the [place]. Clinician explored options for thought changing in order to adjust his expectations of his mother, noting that she did the best she could based on what she knew then.  Clinician processed ongoing grief sxs and identified the importance of looking for signs of communication from father. Clinician noted that his father is spiritually close to him and that knowing this, thinking about him, and remembering his stories can help keep him alive in  [place] memory.   Plan: Return again in 2 weeks.   Diagnosis: Axis I: Major depressive disorder, moderate and Generalized Anxiety Disorder       [name], LCSW 11/25/2019

## 2019-12-03 ENCOUNTER — Other Ambulatory Visit: Payer: Self-pay

## 2019-12-03 ENCOUNTER — Ambulatory Visit (INDEPENDENT_AMBULATORY_CARE_PROVIDER_SITE_OTHER): Payer: Medicare Other

## 2019-12-03 DIAGNOSIS — Z7901 Long term (current) use of anticoagulants: Secondary | ICD-10-CM | POA: Diagnosis not present

## 2019-12-03 DIAGNOSIS — Z5181 Encounter for therapeutic drug level monitoring: Secondary | ICD-10-CM

## 2019-12-03 DIAGNOSIS — I482 Chronic atrial fibrillation, unspecified: Secondary | ICD-10-CM

## 2019-12-03 LAB — POCT INR: INR: 2.8 (ref 2.0–3.0)

## 2019-12-03 NOTE — Patient Instructions (Signed)
Continue on same dosage 1 tablet daily except 1/2 tablet on Tuesdays and Fridays. Repeat INR in 8 weeks.    

## 2019-12-09 ENCOUNTER — Ambulatory Visit (INDEPENDENT_AMBULATORY_CARE_PROVIDER_SITE_OTHER): Payer: Medicare Other | Admitting: Licensed Clinical Social Worker

## 2019-12-09 ENCOUNTER — Other Ambulatory Visit: Payer: Self-pay

## 2019-12-09 ENCOUNTER — Encounter (HOSPITAL_COMMUNITY): Payer: Self-pay | Admitting: Licensed Clinical Social Worker

## 2019-12-09 DIAGNOSIS — F331 Major depressive disorder, recurrent, moderate: Secondary | ICD-10-CM | POA: Diagnosis not present

## 2019-12-09 DIAGNOSIS — F411 Generalized anxiety disorder: Secondary | ICD-10-CM | POA: Diagnosis not present

## 2019-12-09 NOTE — Progress Notes (Signed)
   THERAPIST PROGRESS NOTE  Session Time: 9:00am-9:55am  Participation Level: Active  Behavioral Response: Well GroomedAlertDepressed  Type of Therapy: Individual Therapy   Treatment Goals addressed:  "to cope with stress and manage depression and anxiety". Audwin will report improved stress management and coping skills, resulting in mood stability and less worrying 4 out of 7 days.    Interventions: Motivational Interviewing    Summary: Rodney Friedman is a 74 y.o. male who presents with Major Depressive Disorder, moderate and Generalized Anxiety Disorder   Suicidal/Homicidal: No without intent/plan   Therapist Response:   [name] met with clinician for individual therapy. Rodney Friedman discussed his psychiatric symptoms and current life events. Rodney Friedman reports he continues to experience grief and depression. Clinician discussed current sxs of grief, noting 1-4 episodes of tearfulness for a few minutes to 30 minutes per day. Clinician normalized this experience and identified this as love with nowhere to go. Clinician encouraged Rodney Friedman to "talk" to dad, remember and share the stories, and to remember guidance given by dad over the years. Clinician processed recent realizations and memories of dad, noting his thoughtfulness and consideration of Rodney Friedman's feelings. Rodney Friedman identified role identity issues, as now he is only a caregiver for himself. Clinician identified ongoing value as support for his children and grandchildren.    Plan: Return again in 2 weeks.   Diagnosis: Axis I: Major depressive disorder, moderate and Generalized Anxiety Disorder         [name], LCSW 12/09/2019

## 2019-12-19 ENCOUNTER — Encounter (HOSPITAL_COMMUNITY): Payer: Self-pay | Admitting: Psychiatry

## 2019-12-19 ENCOUNTER — Other Ambulatory Visit: Payer: Self-pay

## 2019-12-19 ENCOUNTER — Ambulatory Visit (INDEPENDENT_AMBULATORY_CARE_PROVIDER_SITE_OTHER): Payer: Medicare Other | Admitting: Psychiatry

## 2019-12-19 DIAGNOSIS — F325 Major depressive disorder, single episode, in full remission: Secondary | ICD-10-CM

## 2019-12-19 DIAGNOSIS — I25709 Atherosclerosis of coronary artery bypass graft(s), unspecified, with unspecified angina pectoris: Secondary | ICD-10-CM | POA: Diagnosis not present

## 2019-12-19 MED ORDER — BUPROPION HCL ER (XL) 300 MG PO TB24: ORAL_TABLET | ORAL | 2 refills | Status: DC

## 2019-12-19 MED ORDER — GABAPENTIN 100 MG PO CAPS: ORAL_CAPSULE | ORAL | 1 refills | Status: DC

## 2019-12-19 NOTE — Progress Notes (Signed)
Psychiatric Initial Adult Assessment   Patient Identification: Rodney Friedman MRN:  [id number] Date of Evaluation:  12/19/2019 Referral Source: Dr. Nyoka Cowden Chief Complaint: Need follow-up care   Visit Diagnosis: Bipolar disorder, substance use disorder Bipolar disorder  History of Present Illness:  Today the patient is doing fair.  His father finally in March.  He seems to have some degree of closure.  His father unfortunately had lots of problems with incontinence of stool and it affected the last week or 2 of his life.  Nonetheless the patient's father died at home.  The patient is spreading his father's ashes in various places.  The patient seems to be stable in other ways.  He has an aortic valve stenosis but there is no need to take action at this time.  He denies chest pain or shortness of breath.  He actually does exercise.  He stays active.  Financially he is stable.  There is no romance in his life at this time.  Patient continues to read do crossword puzzles and stay mentally active.  His hygiene looks great.  He continues in one-to-one therapy.  While he handling the acute death he says he misses his father every day.  He knows this will take time.  Meanwhile he is alcohol free for 19 years and continues to go to AA and also NA.  He seems to have a good support system in this way.  Continues in therapy here and clearly benefits from this intervention.    Substance Abuse History in the last 12 months:  Yes.    Consequences of Substance Abuse: NA  Past Medical History:  Past Medical History:  Diagnosis Date  . Adenomatous colon polyp   . Alcoholism ([place])   . Anxiety   . Atrial fibrillation ([place])   . CAD (coronary artery disease)   . Cataract    removed left eye   . 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 Depression   . Diabetes mellitus without complication ([place])   . Diverticulosis   . Eye abnormality    Macular scarring R eye  . Glaucoma   . Heart murmur    . HLD (hyperlipidemia)   . Hypertension   . Leukemia ([place])    CLL  . Myocardial infarction ([place]) 2006  . Prostate cancer ([place]) 07/08/2013   [facility]- getting Lupron shot every 6 months - traces in prostate and 2 lymphnodes left hip- non focused traces per pt   . Sleep apnea   . Substance abuse ([place])   . 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 Arm Surgery Right    from door accident with glass  . CATARACT EXTRACTION W/ INTRAOCULAR LENS IMPLANT Left   . COLONOSCOPY    . 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 POLYPECTOMY      Family Psychiatric History:   Family History:  Family History  Problem Relation Age of Onset  . Colon cancer Mother   . Ovarian cancer Mother   . Uterine cancer Mother   . Hypertension Father   . Prostate cancer Father   . Colon polyps Neg Hx     Social History:   Social History   Socioeconomic History  . Marital status: Divorced    Spouse name: Not on file  . Number of children: 2  . Years of education: 23  . Highest education level: Not on file  Occupational History  . Occupation:  Retired Pharmacist, hospital  Tobacco Use  . Smoking status: Current Every Day Smoker    Packs/day: 0.50    Years: 50.00    Pack years: 25.00    Types: Cigarettes  . Smokeless tobacco: Never Used  Vaping Use  . Vaping Use: Never used  Substance and Sexual Activity  . Alcohol use: No    Alcohol/week: 0.0 standard drinks  . Drug use: No  . Sexual activity: Not Currently  Other Topics Concern  . Not on file  Social History Narrative   Lives at home w/ his father   Divorced   Right-handed   Education: Secretary/administrator   No caffeine   Social Determinants of Radio broadcast assistant Strain:   . Difficulty of Paying Living Expenses:   Food Insecurity:   . Worried About Charity fundraiser in the Last Year:   . Arboriculturist in the Last Year:   Transportation Needs:   . Film/video editor (Medical):    Marland Kitchen Lack of Transportation (Non-Medical):   Physical Activity:   . Days of Exercise per Week:   . Minutes of Exercise per Session:   Stress:   . Feeling of Stress :   Social Connections:   . Frequency of Communication with Friends and Family:   . Frequency of Social Gatherings with Friends and Family:   . Attends Religious Services:   . Active Member of Clubs or Organizations:   . Attends Archivist Meetings:   Marland Kitchen Marital Status:     Additional Social History:   Allergies:   Allergies  Allergen Reactions  . Other Rash    Allergen: "Plants and bushes while doing yard work"    Metabolic Disorder Labs: Lab Results  Component Value Date   HGBA1C 6.2 (H) 10/27/2019   MPG 117 02/11/2016   MPG 128 (H) 03/22/2015   No results found for: PROLACTIN Lab Results  Component Value Date   CHOL 123 10/27/2019   TRIG 158 (H) 10/27/2019   HDL 39 (L) 10/27/2019   CHOLHDL 3.2 10/27/2019   VLDL 41 (H) 03/10/2016   LDLCALC 57 10/27/2019   LDLCALC 48 03/13/2019     Current Medications: Current Outpatient Medications  Medication Sig Dispense Refill  . amLODipine (NORVASC) 10 MG tablet Take 1 tablet (10 mg total) by mouth daily. 90 tablet 3  . B Complex Vitamins (VITAMIN-B COMPLEX PO) Take by mouth.    Marland Kitchen BIOTIN PO Take by mouth.    Marland Kitchen buPROPion (WELLBUTRIN XL) 300 MG 24 hr tablet 1  qam 90 tablet 2  . Calcium Carbonate-Vit D-Min (CALCIUM 1200 PO) Take 1,200 mg by mouth daily.    Mariane Baumgarten Calcium (STOOL SOFTENER PO) Take 1 tablet as needed by mouth.    . ezetimibe (ZETIA) 10 MG tablet TAKE 1 TABLET BY MOUTH EVERY DAY 90 tablet 3  . gabapentin (NEURONTIN) 100 MG capsule 3  qhs 270 capsule 1  . HYDROcodone-acetaminophen (NORCO/VICODIN) 5-325 MG tablet Take 1 tablet by mouth every 6 (six) hours as needed.    Marland Kitchen ketoconazole (NIZORAL) 2 % shampoo Apply 1 application topically 2 (two) times a week. 120 mL 1  . latanoprost (XALATAN) 0.005 % ophthalmic solution Once a day every  second day    . Leuprolide Acetate, 6 Month, (LUPRON) 45 MG injection Inject 45 mg into the muscle every 6 (six) months.    Marland Kitchen LYCOPENE PO Take 8 mg by mouth daily.    . metFORMIN (GLUCOPHAGE) 500  MG tablet Take 1 tablet (500 mg total) by mouth 2 (two) times daily with a meal. 180 tablet 1  . metoprolol (TOPROL-XL) 200 MG 24 hr tablet Take 0.5 tablets (100 mg total) by mouth 2 (two) times daily. Take with or immediately following a meal. 90 tablet 3  . Omega-3 Fatty Acids (FISH OIL) 1000 MG CAPS Take 1,000 mg by mouth daily.    . rosuvastatin (CRESTOR) 20 MG tablet Take 1 tablet (20 mg total) by mouth daily. 90 tablet 3  . valsartan (DIOVAN) 320 MG tablet Take 1 tablet (320 mg total) by mouth daily. 90 tablet 3  . vitamin B-12 (CYANOCOBALAMIN) 500 MCG tablet Take 500 mcg by mouth daily.    . vitamin C (ASCORBIC ACID) 500 MG tablet Take 500 mg by mouth daily.    Marland Kitchen warfarin (COUMADIN) 4 MG tablet TAKE 1/2 TO 1 TABLET DAILY AS DIRECTED BY COUMADIN CLINIC 90 tablet 1   Current Facility-Administered Medications  Medication Dose Route Frequency Provider Last Rate Last Admin  . 0.9 %  sodium chloride infusion  500 mL Intravenous Once Doran Stabler, MD        Neurologic: Headache: No Seizure: No Paresthesias:No  Musculoskeletal: Strength & Muscle Tone: within normal limits Gait & Station: normal Patient leans: Right  Psychiatric Specialty Exam: ROS  There were no vitals taken for this visit.There is no height or weight on file to calculate BMI.  General Appearance: Casual  Eye Contact:  Good  Speech:  Clear and Coherent  Volume:  Normal  Mood:  NA  Affect:  Appropriate  Thought Process:  Goal Directed  Orientation:  NA  Thought Content:  WDL  Suicidal Thoughts:  No  Homicidal Thoughts:  No  Memory:  Negative  Judgement:  Good  Insight:  Good  Psychomotor Activity:  Normal  Concentration:    Recall:  Marceline of Knowledge:Good  Language: Good  Akathisia:  No  Handed:   Right  AIMS (if indicated):    Assets:  Desire for Improvement  ADL's:  Intact  Cognition: WNL  Sleep:      Treatment Plan Summary: 7/30/20219:08 AM   This patient's first problem is that of major depression.  He continues taking Wellbutrin 300 mg and does well.  His second problem is that of alcohol use disorder.  He continues taking Neurontin at night for this and attending AA on a regular basis.  He clearly benefits by staying in one-to-one therapy.  He knows this will take time.  He still has his son and his granddaughter who are there for him.  This patient will return to see me in 3 months.  The patient is not suicidal and is functioning actually very well.

## 2019-12-23 ENCOUNTER — Encounter (HOSPITAL_COMMUNITY): Payer: Self-pay | Admitting: Licensed Clinical Social Worker

## 2019-12-23 ENCOUNTER — Ambulatory Visit (INDEPENDENT_AMBULATORY_CARE_PROVIDER_SITE_OTHER): Payer: Medicare Other | Admitting: Licensed Clinical Social Worker

## 2019-12-23 ENCOUNTER — Other Ambulatory Visit: Payer: Self-pay

## 2019-12-23 DIAGNOSIS — F411 Generalized anxiety disorder: Secondary | ICD-10-CM | POA: Diagnosis not present

## 2019-12-23 DIAGNOSIS — F331 Major depressive disorder, recurrent, moderate: Secondary | ICD-10-CM

## 2019-12-23 NOTE — Progress Notes (Signed)
   THERAPIST PROGRESS NOTE  Session Time: 9:00am-9:50am  Participation Level: Active  Behavioral Response: NeatAlertDepressed and Irritable  Type of Therapy: Individual Therapy  Treatment Goals addressed:  "to cope with stress and manage depression and anxiety". [name] will report improved stress management and coping skills, resulting in mood stability and less worrying 4 out of 7 days.    Interventions: Motivational Interviewing    Summary: Rodney Friedman is a 74 y.o. male who presents with Major Depressive Disorder, moderate and Generalized Anxiety Disorder   Suicidal/Homicidal: No without intent/plan   Therapist Response:   [name] met with clinician for individual therapy. [name] discussed his psychiatric symptoms and current life events. [name] reports he continues to feel frustration over small things that he considers "life on life's terms". Clinician processed thoughts and feelings over these frustrations using MI OARS. Clinician explored options for thought changing to acceptance and willingness to keep trying. [name] updated clinician on the struggles of daily life with his children, grandchildren, and technology. Clinician discussed coping strategies for relaxing, staying organized, and being able to take one day at a time. Clinician explored increasing his social circle to invest in his own support network. Clinician also identified options for him to volunteer at a museum or in a food bank to meet people and to do some good in the world.    Plan: Return again in 2 weeks.   Diagnosis: Axis I: Major depressive disorder, moderate and Generalized Anxiety Disorder    [name], LCSW 12/23/2019

## 2019-12-31 ENCOUNTER — Other Ambulatory Visit: Payer: Self-pay | Admitting: Family Medicine

## 2019-12-31 DIAGNOSIS — E119 Type 2 diabetes mellitus without complications: Secondary | ICD-10-CM

## 2019-12-31 NOTE — Telephone Encounter (Signed)
Requested Prescriptions  Pending Prescriptions Disp Refills  . metFORMIN (GLUCOPHAGE) 500 MG tablet [Pharmacy Med Name: METFORMIN HCL 500 MG TABLET] 90 tablet 1    Sig: TAKE 1 TABLET (500 MG TOTAL) BY MOUTH EVERY EVENING.     Endocrinology:  Diabetes - Biguanides Passed - 12/31/2019  6:27 AM      Passed - Cr in normal range and within 360 days    Creatinine  Date Value Ref Range Status  01/04/2015 0.9 0.7 - 1.3 mg/dL Final   Creat  Date Value Ref Range Status  03/10/2016 1.16 0.70 - 1.25 mg/dL Final    Comment:      For patients > or = 72 years of age: The upper reference limit for Creatinine is approximately 13% higher for people identified as African-American.      Creatinine, Ser  Date Value Ref Range Status  10/27/2019 0.90 0.76 - 1.27 mg/dL Final         Passed - HBA1C is between 0 and 7.9 and within 180 days    Hgb A1c MFr Bld  Date Value Ref Range Status  10/27/2019 6.2 (H) 4.8 - 5.6 % Final    Comment:             Prediabetes: 5.7 - 6.4          Diabetes: >6.4          Glycemic control for adults with diabetes: <7.0          Passed - eGFR in normal range and within 360 days    GFR, Est African American  Date Value Ref Range Status  02/11/2016 81 >=60 mL/min Final   GFR calc Af Amer  Date Value Ref Range Status  10/27/2019 99 >59 mL/min/1.73 Final    Comment:    **[facility] currently reports eGFR in compliance with the current**   recommendations of the [employer]. [facility] will   update reporting as new guidelines are published from the NKF-ASN   Task force.    GFR, Est Non African American  Date Value Ref Range Status  02/11/2016 70 >=60 mL/min Final   GFR calc non Af Amer  Date Value Ref Range Status  10/27/2019 86 >59 mL/min/1.73 Final   EGFR  Date Value Ref Range Status  01/04/2015 89 (L) >90 ml/min/1.73 m2 Final    Comment:    eGFR is calculated using the CKD-EPI Creatinine Equation (2009)         Passed - Valid encounter  within last 6 months    Recent Outpatient Visits          2 months ago Type 2 diabetes mellitus with other specified complication, without long-term current use of insulin [facility])   [facility], Ranell Patrick, MD   8 months ago Type 2 diabetes mellitus without complication, without long-term current use of insulin [facility])   [facility], Ranell Patrick, MD   8 months ago Medicare annual wellness visit, subsequent   [facility], Ranell Patrick, MD   1 year ago Type 2 diabetes mellitus without complication, without long-term current use of insulin [facility])   [facility], Ranell Patrick, MD   1 year ago Essential hypertension   [facility] [place], MD      Future Appointments            In 3 months Croitoru, Dani Gobble, MD [facility] [place], [facility]  In 3 months Wendie Agreste, MD [facility] at [place], [facility]

## 2020-01-12 ENCOUNTER — Encounter: Payer: Self-pay | Admitting: Hematology and Oncology

## 2020-01-12 ENCOUNTER — Encounter: Payer: Self-pay | Admitting: Family Medicine

## 2020-01-12 NOTE — Telephone Encounter (Signed)
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

## 2020-01-13 DIAGNOSIS — C775 Secondary and unspecified malignant neoplasm of intrapelvic lymph nodes: Secondary | ICD-10-CM | POA: Diagnosis not present

## 2020-01-13 DIAGNOSIS — Z5111 Encounter for antineoplastic chemotherapy: Secondary | ICD-10-CM | POA: Diagnosis not present

## 2020-01-13 DIAGNOSIS — C61 Malignant neoplasm of prostate: Secondary | ICD-10-CM | POA: Diagnosis not present

## 2020-01-19 DIAGNOSIS — C61 Malignant neoplasm of prostate: Secondary | ICD-10-CM | POA: Diagnosis not present

## 2020-01-20 ENCOUNTER — Other Ambulatory Visit: Payer: Self-pay

## 2020-01-20 ENCOUNTER — Encounter (HOSPITAL_COMMUNITY): Payer: Self-pay | Admitting: Licensed Clinical Social Worker

## 2020-01-20 ENCOUNTER — Ambulatory Visit (INDEPENDENT_AMBULATORY_CARE_PROVIDER_SITE_OTHER): Payer: Medicare Other | Admitting: Licensed Clinical Social Worker

## 2020-01-20 DIAGNOSIS — F331 Major depressive disorder, recurrent, moderate: Secondary | ICD-10-CM

## 2020-01-20 DIAGNOSIS — F411 Generalized anxiety disorder: Secondary | ICD-10-CM | POA: Diagnosis not present

## 2020-01-20 NOTE — Progress Notes (Signed)
   THERAPIST PROGRESS NOTE  Session Time: 8:00am-8:50am  Participation Level: Active  Behavioral Response: NeatAlertDepressed  Type of Therapy: Individual Therapy   Treatment Goals addressed:  "to cope with stress and manage depression and anxiety". Cay[place] will report improved stress management and coping skills, resulting in mood stability and less worrying 4 out of 7 days.    Interventions: Motivational Interviewing    Summary: Chayne Sylvester is a 74 y.o. male who presents with Major Depressive Disorder, moderate and Generalized Anxiety Disorder   Suicidal/Homicidal: No without intent/plan   Therapist Response:   Gavan met with clinician for individual therapy. Pericles discussed his psychiatric symptoms and current life events. Yehuda reports he has been on an emotional rollercoaster over the past few weeks due to his own health. 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 Clinician processed experience of his exams and diagnostic procedures using MI OARS. Clinician reflected fear, sadness, and some sense of despair due to his role as caregiver continuing now for himself. Karlo expressed that he has spent the last 20 years or more caring for his parents and now he has to be his own medical advocate. Clinician discussed disappointment that he cannot spend his retirement on the golf course or smoking cigars in a [place] shirt. However, clinician identified coping skills and ways to enjoy life a bit more, even with upcoming radiation treatment.     Plan: Return again in 2 weeks.   Diagnosis: Axis I: Major depressive disorder, moderate and Generalized Anxiety Disorder   Jessica R Schlosberg, LCSW 01/20/2020  

## 2020-01-21 DIAGNOSIS — H31011 Macula scars of posterior pole (postinflammatory) (post-traumatic), right eye: Secondary | ICD-10-CM | POA: Diagnosis not present

## 2020-01-21 DIAGNOSIS — H401132 Primary open-angle glaucoma, bilateral, moderate stage: Secondary | ICD-10-CM | POA: Diagnosis not present

## 2020-01-21 DIAGNOSIS — H2511 Age-related nuclear cataract, right eye: Secondary | ICD-10-CM | POA: Diagnosis not present

## 2020-01-21 DIAGNOSIS — H35713 Central serous chorioretinopathy, bilateral: Secondary | ICD-10-CM | POA: Diagnosis not present

## 2020-01-21 DIAGNOSIS — Z961 Presence of intraocular lens: Secondary | ICD-10-CM | POA: Diagnosis not present

## 2020-01-22 DIAGNOSIS — Z23 Encounter for immunization: Secondary | ICD-10-CM | POA: Diagnosis not present

## 2020-01-28 ENCOUNTER — Ambulatory Visit (INDEPENDENT_AMBULATORY_CARE_PROVIDER_SITE_OTHER): Payer: Medicare Other

## 2020-01-28 ENCOUNTER — Other Ambulatory Visit: Payer: Self-pay

## 2020-01-28 DIAGNOSIS — I482 Chronic atrial fibrillation, unspecified: Secondary | ICD-10-CM | POA: Diagnosis not present

## 2020-01-28 DIAGNOSIS — Z5181 Encounter for therapeutic drug level monitoring: Secondary | ICD-10-CM

## 2020-01-28 DIAGNOSIS — Z7901 Long term (current) use of anticoagulants: Secondary | ICD-10-CM | POA: Diagnosis not present

## 2020-01-28 LAB — POCT INR: INR: 1.8 — AB (ref 2.0–3.0)

## 2020-01-28 NOTE — Patient Instructions (Signed)
Take 2 tablets today and then Continue on same dosage 1 tablet daily except 1/2 tablet on Tuesdays and Fridays. Repeat INR in 8 weeks.

## 2020-02-01 ENCOUNTER — Encounter: Payer: Self-pay | Admitting: Family Medicine

## 2020-02-01 DIAGNOSIS — E119 Type 2 diabetes mellitus without complications: Secondary | ICD-10-CM

## 2020-02-02 MED ORDER — METFORMIN HCL 500 MG PO TABS: 500.0000 mg | ORAL_TABLET | Freq: Two times a day (BID) | ORAL | 1 refills | Status: DC

## 2020-02-02 NOTE — Telephone Encounter (Signed)
To add to pt's message after reviewing pt's chart. I saw that Rodney Lark, MD had put to pt on 500 mg metformin 2x daily with food, and your last update was for the pt to take 500mg  metformin once daily. Please advise.

## 2020-02-03 ENCOUNTER — Encounter (HOSPITAL_COMMUNITY): Payer: Self-pay | Admitting: Licensed Clinical Social Worker

## 2020-02-03 ENCOUNTER — Other Ambulatory Visit: Payer: Self-pay

## 2020-02-03 ENCOUNTER — Ambulatory Visit (INDEPENDENT_AMBULATORY_CARE_PROVIDER_SITE_OTHER): Payer: Medicare Other | Admitting: Licensed Clinical Social Worker

## 2020-02-03 DIAGNOSIS — F331 Major depressive disorder, recurrent, moderate: Secondary | ICD-10-CM | POA: Diagnosis not present

## 2020-02-03 DIAGNOSIS — F411 Generalized anxiety disorder: Secondary | ICD-10-CM | POA: Diagnosis not present

## 2020-02-03 NOTE — Progress Notes (Signed)
° °  THERAPIST PROGRESS NOTE  Session Time: 9:00am-9:55am  Participation Level: Active  Behavioral Response: Well GroomedAlertDepressed and Irritable  Type of Therapy: Individual Therapy   Treatment Goals addressed:  "to cope with stress and manage depression and anxiety". [name] will report improved stress management and coping skills, resulting in mood stability and less worrying 4 out of 7 days.    Interventions: Motivational Interviewing    Summary: Rodney Friedman is a 74 y.o. male who presents with Major Depressive Disorder, moderate and Generalized Anxiety Disorder   Suicidal/Homicidal: No without intent/plan   Therapist Response:   Rodney Friedman met with clinician for individual therapy. Rodney Friedman discussed his psychiatric symptoms and current life events. Farzad reports he continues to struggle with small stuff, such as linking bills to bank accounts, coping with scheduling appointments, and some bigger things like preparing his affairs for his son to deal with when he dies. Clinician processed these concerns using MI OARS. Clinician reflected the frustration and noted a more intense disturbance to Rodney Friedman spirit when things go wrong. Clinician explored options that would make things easier, such as updating his technology or asking for help. Clinician also noted the biggest concern, which is what son will do when he dies. Clinician identified Rodney Friedman ongoing frustration with sons lack of responsibility related to finances. Clinician noted that he has been enabling his son for years by bailing him out or keeping him on the payroll. Clinician also validated that this is how Rodney Friedman has been making up for not being present when Rodney Friedman was a younger kid.      Plan: Return again in 2 weeks.   Diagnosis: Axis I: Major depressive disorder, moderate and Generalized Anxiety Disorder  [name], LCSW 02/03/2020

## 2020-02-05 ENCOUNTER — Other Ambulatory Visit (HOSPITAL_COMMUNITY): Payer: Self-pay | Admitting: Urology

## 2020-02-05 ENCOUNTER — Encounter: Payer: Self-pay | Admitting: Radiation Oncology

## 2020-02-05 DIAGNOSIS — C61 Malignant neoplasm of prostate: Secondary | ICD-10-CM

## 2020-02-05 NOTE — Progress Notes (Signed)
GU Location of Tumor / Histology: Recurrent prostatic adenocarcinoma  If Prostate Cancer, Gleason Score is (4 + 4) and PSA is (9.6) pre treatment.  [name] Biegler has Stage [name] prostate cancer with IMRT in 2014 in [place] with PSA recurrence in 2019 at PSA 7.2 with left iliac node on PET. ADT started in 2019 and psa undetectable.    Biopsies of prostate (if applicable) revealed:   Past/Anticipated interventions by urology, if any: prostate biopsy, ADT, PET, referral to Dr. Tammi Klippel for consideration of radiation therapy to pelvic lymph nodes.  Past/Anticipated interventions by medical oncology, if any: no  Weight changes, if any: none  Bowel/Bladder complaints, if any: Reports urinary frequency, nocturia, intermittency and leakage.   Nausea/Vomiting, if any: no  Pain issues, if any:  denies  SAFETY ISSUES:  Prior radiation? yes, IMRT in [place] with Dr. Gerda Diss  Pacemaker/ICD? no  Possible current pregnancy? no, male patient  Is the patient on methotrexate? no  Current Complaints / other details:  74 year old male. CLL in remission managed by Dr. Alvy Bimler. Patient in for consult denies any issues or complaints of. AUA 12 is not sexual active. Possible mets to one lymph node. Has a PET scheduled on Wednesday at 2pm. Denies any pin point pain at this time.

## 2020-02-06 ENCOUNTER — Encounter: Payer: Self-pay | Admitting: Radiation Oncology

## 2020-02-06 ENCOUNTER — Ambulatory Visit
Admission: RE | Admit: 2020-02-06 | Discharge: 2020-02-06 | Disposition: A | Discharge status: Home / Self Care | Payer: Medicare Other | Source: Ambulatory Visit | Attending: Radiation Oncology | Admitting: Radiation Oncology

## 2020-02-06 ENCOUNTER — Other Ambulatory Visit: Payer: Self-pay

## 2020-02-06 VITALS — BP 125/75 | HR 78 | Temp 97.5°F | Resp 18 | Ht 69.0 in | Wt 217.4 lb

## 2020-02-06 DIAGNOSIS — C61 Malignant neoplasm of prostate: Secondary | ICD-10-CM

## 2020-02-06 DIAGNOSIS — I10 Essential (primary) hypertension: Secondary | ICD-10-CM | POA: Diagnosis not present

## 2020-02-06 DIAGNOSIS — E785 Hyperlipidemia, unspecified: Secondary | ICD-10-CM | POA: Diagnosis not present

## 2020-02-06 DIAGNOSIS — Z923 Personal history of irradiation: Secondary | ICD-10-CM | POA: Diagnosis not present

## 2020-02-06 DIAGNOSIS — Z79899 Other long term (current) drug therapy: Secondary | ICD-10-CM | POA: Insufficient documentation

## 2020-02-06 DIAGNOSIS — F418 Other specified anxiety disorders: Secondary | ICD-10-CM | POA: Diagnosis not present

## 2020-02-06 DIAGNOSIS — I252 Old myocardial infarction: Secondary | ICD-10-CM | POA: Diagnosis not present

## 2020-02-06 DIAGNOSIS — Z7984 Long term (current) use of oral hypoglycemic drugs: Secondary | ICD-10-CM | POA: Diagnosis not present

## 2020-02-06 DIAGNOSIS — Z86010 Personal history of colonic polyps: Secondary | ICD-10-CM | POA: Diagnosis not present

## 2020-02-06 DIAGNOSIS — I2510 Atherosclerotic heart disease of native coronary artery without angina pectoris: Secondary | ICD-10-CM | POA: Insufficient documentation

## 2020-02-06 DIAGNOSIS — Z8042 Family history of malignant neoplasm of prostate: Secondary | ICD-10-CM | POA: Diagnosis not present

## 2020-02-06 DIAGNOSIS — E119 Type 2 diabetes mellitus without complications: Secondary | ICD-10-CM | POA: Diagnosis not present

## 2020-02-06 DIAGNOSIS — C9111 Chronic lymphocytic leukemia of B-cell type in remission: Secondary | ICD-10-CM | POA: Insufficient documentation

## 2020-02-06 DIAGNOSIS — Z79818 Long term (current) use of other agents affecting estrogen receptors and estrogen levels: Secondary | ICD-10-CM | POA: Diagnosis not present

## 2020-02-06 DIAGNOSIS — G4730 Sleep apnea, unspecified: Secondary | ICD-10-CM | POA: Insufficient documentation

## 2020-02-06 DIAGNOSIS — C775 Secondary and unspecified malignant neoplasm of intrapelvic lymph nodes: Secondary | ICD-10-CM

## 2020-02-06 DIAGNOSIS — F17210 Nicotine dependence, cigarettes, uncomplicated: Secondary | ICD-10-CM | POA: Insufficient documentation

## 2020-02-06 DIAGNOSIS — R011 Cardiac murmur, unspecified: Secondary | ICD-10-CM | POA: Diagnosis not present

## 2020-02-06 NOTE — Progress Notes (Signed)
Radiation Oncology         (336) [phone] ________________________________  Initial outpatient Consultation  Name: Rodney Friedman MRN: [id number]  Date: 02/06/2020  DOB: [birth year]  YB:WLSLHT, Ranell Patrick, MD  Festus Aloe, MD   REFERRING PHYSICIAN: Festus Aloe, MD  DIAGNOSIS: 74 y.o. 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    ICD-10-CM   1. Prostate cancer ([place])  C61   2. Prostate cancer metastatic to intrapelvic lymph node ([facility])  C61    C77.5     HISTORY OF PRESENT ILLNESS: Rodney Friedman is a 74 y.o. male with a diagnosis of oligometastatic prostate cancer. He also has a history of CLL, managed by Dr. Alvy Bimler. He was initially seen for an elevated PSA of 9.6 in 06/2012 while living in [place], [place]. He underwent a prostate biopsy on 09/30/12 showing 20% of one core with Gleason 8 prostate cancer. Staging scans performed at that time were negative. He opted to proceed with concurrent LT-ADT and external beam radiation.  His radiation was completed on 01/19/13 under the care of Dr. Cherly Hensen.  He moved to [facility] in 05/2013 and established care with Dr. Karsten Ro at [facility].  He remained on Lupron until 2018 when they tried a brief pause in therapy to assess PSA response. The PSA became detectable and when it reached 7.21 in 05/2017, he underwent PET scan on 06/22/2017 showing intense radiotracer accumulation with an enlarged left external iliac lymph nodes as well as diffuse uptake within prostate gland but no evidence of more distant metastatic disease or skeletal metastasis. He was restarted on ADT and has remained on Lupron 45mg  injections since that time, with undetectable PSA levels.    He was referred to Dr. Alen Blew in 07/2017 to discuss additional treatment options. Richarda Osmond was  discussed at that time, but the final decision was to proceed with ADT alone and reserve therapy escalation for the future should he develop castrate resistant disease. His most recent PSA was undetectable on 01/06/2020 and most recent Lupron injection was 01/13/20.  With the retirement of Dr. Karsten Ro, his care was transitioned to Dr. Louis Meckel and subsequently to Dr. Junious Silk in the advanced prostate cancer clinic.   The patient reviewed the PET imaging from 2019 and recent PSA results with Dr. Junious Silk and he has kindly been referred today for discussion of potential radiation treatment options for management of oligometastatic disease.   PREVIOUS RADIATION THERAPY: Yes  11/11/12 - 01/19/2013: Prostate IMRT / 80.4 Gy in 43 fractions with Dr. Cherly Hensen in [place], [place].  PAST MEDICAL HISTORY:  Past Medical History:  Diagnosis Date  . Adenomatous colon polyp   . Alcoholism ([place])   . Anxiety   . Atrial fibrillation ([place])   . CAD (coronary artery disease)   . Cataract    removed left eye   . CLL (chronic lymphocytic leukemia) ([place]) 07/08/2013  . Depression   . Diabetes mellitus without complication ([place])   . Diverticulosis   . Eye abnormality    Macular scarring R eye  . Glaucoma   . Heart murmur   . HLD (hyperlipidemia)   . Hypertension   . Leukemia ([place])    CLL  . Myocardial infarction ([place]) 2006  . Prostate cancer ([place]) 07/08/2013   [facility] at [facility] [place]- getting Lupron shot every 6 months - traces  in prostate and 2 lymphnodes left hip- non focused traces per pt   . Sleep apnea   . Substance abuse ([place])   . 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 Arm Surgery Right    from door accident with glass  . CATARACT EXTRACTION W/ INTRAOCULAR LENS IMPLANT Left   . COLONOSCOPY    . 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 POLYPECTOMY      FAMILY HISTORY:  Family History  Problem Relation Age of  Onset  . Colon cancer Mother   . Ovarian cancer Mother   . Uterine cancer Mother   . Hypertension Father   . Prostate cancer Father   . Colon polyps Neg Hx     SOCIAL HISTORY:  Social History   Socioeconomic History  . Marital status: Divorced    Spouse name: Not on file  . Number of children: 2  . Years of education: 29  . Highest education level: Not on file  Occupational History  . Occupation: Retired Pharmacist, hospital  Tobacco Use  . Smoking status: Current Every Day Smoker    Packs/day: 0.50    Years: 50.00    Pack years: 25.00    Types: Cigarettes  . Smokeless tobacco: Never Used  Vaping Use  . Vaping Use: Never used  Substance and Sexual Activity  . Alcohol use: No    Alcohol/week: 0.0 standard drinks  . Drug use: No  . Sexual activity: Not Currently  Other Topics Concern  . Not on file  Social History Narrative   Lives at home w/ his father   Divorced   Right-handed   Education: Secretary/administrator   No caffeine   Social Determinants of Health   Financial Resource Strain:   . Difficulty of Paying Living Expenses: Not on file  Food Insecurity:   . Worried About Charity fundraiser in the Last Year: Not on file  . Ran Out of Food in the Last Year: Not on file  Transportation Needs:   . Lack of Transportation (Medical): Not on file  . Lack of Transportation (Non-Medical): Not on file  Physical Activity:   . Days of Exercise per Week: Not on file  . Minutes of Exercise per Session: Not on file  Stress:   . Feeling of Stress : Not on file  Social Connections:   . Frequency of Communication with Friends and Family: Not on file  . Frequency of Social Gatherings with Friends and Family: Not on file  . Attends Religious Services: Not on file  . Active Member of Clubs or Organizations: Not on file  . Attends Archivist Meetings: Not on file  . Marital Status: Not on file  Intimate Partner Violence:   . Fear of Current or Ex-Partner: Not on file  . Emotionally  Abused: Not on file  . Physically Abused: Not on file  . Sexually Abused: Not on file    ALLERGIES: Other  MEDICATIONS:  Current Outpatient Medications  Medication Sig Dispense Refill  . amLODipine (NORVASC) 10 MG tablet Take 1 tablet (10 mg total) by mouth daily. 90 tablet 3  . B Complex Vitamins (VITAMIN-B COMPLEX PO) Take by mouth.    Marland Kitchen BIOTIN PO Take by mouth.    Marland Kitchen buPROPion (WELLBUTRIN XL) 300 MG 24 hr tablet 1  qam 90 tablet 2  . Calcium Carbonate-Vit D-Min (CALCIUM 1200 PO) Take 1,200 mg by mouth daily.    Marland Kitchen  Docusate Calcium (STOOL SOFTENER PO) Take 1 tablet as needed by mouth.    . ezetimibe (ZETIA) 10 MG tablet TAKE 1 TABLET BY MOUTH EVERY DAY 90 tablet 3  . gabapentin (NEURONTIN) 100 MG capsule 3  qhs 270 capsule 1  . HYDROcodone-acetaminophen (NORCO/VICODIN) 5-325 MG tablet Take 1 tablet by mouth every 6 (six) hours as needed.    Marland Kitchen ketoconazole (NIZORAL) 2 % shampoo Apply 1 application topically 2 (two) times a week. 120 mL 1  . latanoprost (XALATAN) 0.005 % ophthalmic solution Once a day every second day    . Leuprolide Acetate, 6 Month, (LUPRON) 45 MG injection Inject 45 mg into the muscle every 6 (six) months.    Marland Kitchen LYCOPENE PO Take 8 mg by mouth daily.    . metFORMIN (GLUCOPHAGE) 500 MG tablet Take 1 tablet (500 mg total) by mouth 2 (two) times daily with a meal. 180 tablet 1  . metoprolol (TOPROL-XL) 200 MG 24 hr tablet Take 0.5 tablets (100 mg total) by mouth 2 (two) times daily. Take with or immediately following a meal. 90 tablet 3  . Omega-3 Fatty Acids (FISH OIL) 1000 MG CAPS Take 1,000 mg by mouth daily.    . rosuvastatin (CRESTOR) 20 MG tablet Take 1 tablet (20 mg total) by mouth daily. 90 tablet 3  . valsartan (DIOVAN) 320 MG tablet Take 1 tablet (320 mg total) by mouth daily. 90 tablet 3  . vitamin B-12 (CYANOCOBALAMIN) 500 MCG tablet Take 500 mcg by mouth daily.    . vitamin C (ASCORBIC ACID) 500 MG tablet Take 500 mg by mouth daily.    Marland Kitchen warfarin (COUMADIN) 4  MG tablet TAKE 1/2 TO 1 TABLET DAILY AS DIRECTED BY COUMADIN CLINIC 90 tablet 1   Current Facility-Administered Medications  Medication Dose Route Frequency Provider Last Rate Last Admin  . 0.9 %  sodium chloride infusion  500 mL Intravenous Once Doran Stabler, MD        REVIEW OF SYSTEMS:  On review of systems, the patient reports that he is doing well overall. He denies any chest pain, shortness of breath, cough, fevers, chills, night sweats, unintended weight changes. He denies any bowel disturbances, and denies abdominal pain, nausea or vomiting. He denies any new musculoskeletal or joint aches or pains. His IPSS was 12, indicating mild-moderate urinary symptoms with urgency, frequency, feelings of incomplete bladder emptying and nocturia x3 per night. His SHIM was 1, indicating he has severe erectile dysfunction which is not currently of high priority since he is not sexually active. A complete review of systems is obtained and is otherwise negative.    PHYSICAL EXAM:  Wt Readings from Last 3 Encounters:  02/06/20 217 lb 6 oz (98.6 kg)  10/27/19 219 lb (99.3 kg)  09/09/19 218 lb 12.8 oz (99.2 kg)   Temp Readings from Last 3 Encounters:  02/06/20 (!) 97.5 F (36.4 C) (Oral)  10/27/19 98 F (36.7 C) (Temporal)  09/09/19 (!) 96.5 F (35.8 C)   BP Readings from Last 3 Encounters:  02/06/20 125/75  10/27/19 139/71  09/09/19 140/62   Pulse Readings from Last 3 Encounters:  02/06/20 78  10/27/19 60  09/09/19 61   Pain Assessment Pain Score: 0-No pain/10  In general this is a well appearing Caucasian male in no acute distress. He is alert and oriented x4 and appropriate throughout the examination. HEENT reveals that the patient is normocephalic, atraumatic. EOMs are intact. PERRLA. Skin is intact without any evidence of  gross lesions. Cardiovascular exam reveals a regular rate and rhythm, no clicks rubs or murmurs are auscultated. Cardiopulmonary assessment is negative for  acute distress and he exhibits normal effort. The abdomen is soft, non tender, non distended. Lower extremities are negative for pretibial pitting edema, deep calf tenderness, cyanosis or clubbing.   KPS = 100  100 - Normal; no complaints; no evidence of disease. 90   - Able to carry on normal activity; minor signs or symptoms of disease. 80   - Normal activity with effort; some signs or symptoms of disease. 22   - Cares for self; unable to carry on normal activity or to do active work. 60   - Requires occasional assistance, but is able to care for most of his personal needs. 50   - Requires considerable assistance and frequent medical care. 50   - Disabled; requires special care and assistance. 3   - Severely disabled; hospital admission is indicated although death not imminent. 72   - Very sick; hospital admission necessary; active supportive treatment necessary. 10   - Moribund; fatal processes progressing rapidly. 0     - Dead  Karnofsky DA, Abelmann [place], Craver LS and Burchenal [facility] [phone]) The use of the nitrogen mustards in the palliative treatment of carcinoma: with particular reference to bronchogenic carcinoma Cancer 1 634-56  LABORATORY DATA:  Lab Results  Component Value Date   WBC 16.3 (H) 10/27/2019   HGB 13.1 10/27/2019   HCT 38.2 10/27/2019   MCV 92 10/27/2019   PLT 211 10/27/2019   Lab Results  Component Value Date   NA 144 10/27/2019   K 4.4 10/27/2019   CL 110 (H) 10/27/2019   CO2 21 10/27/2019   Lab Results  Component Value Date   ALT 47 (H) 10/27/2019   AST 34 10/27/2019   ALKPHOS 43 (L) 10/27/2019   BILITOT 0.2 10/27/2019     RADIOGRAPHY: No results found.    IMPRESSION/PLAN: 1. 74 y.o. 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 Today, we talked to the patient about  the findings and workup thus far. We discussed the natural history of oligometastatic prostate cancer and general treatment, highlighting the role of radiotherapy in the management.  We discussed the importance of repeat PET imaging which is scheduled for 02/11/2020, in establishing a current baseline assessment of the extent of metastatic disease which will help to better inform our final treatment recommendation.  We discussed the available radiation techniques, and specifically focused on the details and logistics of the delivery of stereotactic body radiotherapy (SBRT) in patients with 5 or fewer sites of metastatic disease.  Pending his upcoming PET scan confirms continued oligometastatic disease only, we would recommend proceeding with SBRT with curative intent to target active sites of disease.  We reviewed the anticipated acute and late sequelae associated with radiation in this setting. The patient was encouraged to ask questions that were answered to his satisfaction.  At the conclusion of our conversation, the patient is in agreement to proceed with repeat PET imaging as scheduled on 02/11/2020.  We will plan to follow-up with the patient by phone to review those results and pending this continues to indicate oligometastatic disease, he is interested in proceeding with SBRT.  Once we have the results from the PET and if appropriate, we will move forward with  scheduling CT simulation/treatment planning at that time, in anticipation of beginning radiation treatments in the near future.  We will share our discussion with Dr. Junious Silk and look forward to continuing to participate in the care of this very nice gentleman.    Nicholos Johns, PA-C    Tyler Pita, MD  [facility] Direct Dial: [phone]  Fax: [phone] [email or web address]  Skype  [email or web address]   This document serves as a record of services personally performed by Tyler Pita, MD and Freeman Caldron, PA-C.  It was created on their behalf by Wilburn Mylar, a trained medical scribe. The creation of this record is based on the scribe's personal observations and the provider's statements to them. This document has been checked and approved by the attending provider.

## 2020-02-06 NOTE — Patient Instructions (Signed)
Coronavirus (COVID-19) Are you at risk?  Are you at risk for the Coronavirus (COVID-19)?  To be considered HIGH RISK for Coronavirus (COVID-19), you have to meet the following criteria:  . Traveled to [place], [place], [place], [place] or [place]; or in the [place] to [place], [place], [place], or [place]; and have fever, cough, and shortness of breath within the last 2 weeks of travel OR . Been in close contact with a person diagnosed with COVID-19 within the last 2 weeks and have fever, cough, and shortness of breath . IF YOU DO NOT MEET THESE CRITERIA, YOU ARE CONSIDERED LOW RISK FOR COVID-19.  What to do if you are HIGH RISK for COVID-19?  . If you are having a medical emergency, call 911. . Seek medical care right away. Before you go to a doctor's office, urgent care or emergency department, call ahead and tell them about your recent travel, contact with someone diagnosed with COVID-19, and your symptoms. You should receive instructions from your physician's office regarding next steps of care.  . When you arrive at healthcare provider, tell the healthcare staff immediately you have returned from visiting [place], [place], [place], [place] or [place]; or traveled in the [place] to [place], [place], [place], or [place]; in the last two weeks or you have been in close contact with a person diagnosed with COVID-19 in the last 2 weeks.   . Tell the health care staff about your symptoms: fever, cough and shortness of breath. . After you have been seen by a medical provider, you will be either: o Tested for (COVID-19) and discharged home on quarantine except to seek medical care if symptoms worsen, and asked to  - Stay home and avoid contact with others until you get your results (4-5 days)  - Avoid travel on public transportation if possible (such as bus, train, or airplane) or o Sent to the Emergency Department by EMS for evaluation, COVID-19 testing, and possible  admission depending on your condition and test results.  What to do if you are LOW RISK for COVID-19?  Reduce your risk of any infection by using the same precautions used for avoiding the common cold or flu:  . Wash your hands often with soap and warm water for at least 20 seconds.  If soap and water are not readily available, use an alcohol-based hand sanitizer with at least 60% alcohol.  . If coughing or sneezing, cover your mouth and nose by coughing or sneezing into the elbow areas of your shirt or coat, into a tissue or into your sleeve (not your hands). . Avoid shaking hands with others and consider head nods or verbal greetings only. . Avoid touching your eyes, nose, or mouth with unwashed hands.  . Avoid close contact with people who are sick. . Avoid places or events with large numbers of people in one location, like concerts or sporting events. . Carefully consider travel plans you have or are making. . If you are planning any travel outside or inside the [place], visit the CDC's Travelers' Health webpage for the latest health notices. . If you have some symptoms but not all symptoms, continue to monitor at home and seek medical attention if your symptoms worsen. . If you are having a medical emergency, call 911.   ADDITIONAL HEALTHCARE OPTIONS FOR PATIENTS  [facility] / e-Visit: [email or web address]         [facility]: [phone]  [facility]: [phone]                   [facility]: [phone]   

## 2020-02-07 ENCOUNTER — Encounter: Payer: Self-pay | Admitting: Hematology and Oncology

## 2020-02-07 ENCOUNTER — Encounter: Payer: Self-pay | Admitting: Family Medicine

## 2020-02-09 ENCOUNTER — Other Ambulatory Visit: Payer: Self-pay

## 2020-02-09 DIAGNOSIS — C775 Secondary and unspecified malignant neoplasm of intrapelvic lymph nodes: Secondary | ICD-10-CM | POA: Insufficient documentation

## 2020-02-11 ENCOUNTER — Other Ambulatory Visit: Payer: Self-pay

## 2020-02-11 ENCOUNTER — Ambulatory Visit (HOSPITAL_COMMUNITY)
Admission: RE | Admit: 2020-02-11 | Discharge: 2020-02-11 | Disposition: A | Discharge status: Home / Self Care | Payer: Medicare Other | Source: Ambulatory Visit | Attending: Urology | Admitting: Urology

## 2020-02-11 DIAGNOSIS — C61 Malignant neoplasm of prostate: Secondary | ICD-10-CM

## 2020-02-11 MED ORDER — AXUMIN (FLUCICLOVINE F 18) INJECTION
9.4000 | Freq: Once | INTRAVENOUS | Status: AC
  Administered 2020-02-11: 9.4 via INTRAVENOUS

## 2020-02-15 ENCOUNTER — Encounter: Payer: Self-pay | Admitting: Radiation Oncology

## 2020-02-15 NOTE — Progress Notes (Signed)
  Radiation Oncology         [phone] ________________________________  Name: Rodney Friedman MRN: [id number]  Date: 02/15/2020  DOB: June 10, 1945  Chart Note:  I reviewed this patient's most recent findings and wanted to take a minute to document my impression.  I saw him in consultation on 02/06/20.  We anticipated a possible course of salvage SBRT for an isolated recurrent oligometastatic lymph node in the pelvis.  However, his current Axumin PET shows resolution of the visualized node, as his PSA has gone to undetectable.  On careful review the location of the previously seen node from his 2019 appears to correlate with a tiny inactive lymph node on the current scan.  In an effort to potentially discontinue permanent ADT, I think the question we face is whether to pursue one of the following courses of action:  1. Deliver SBRT to the tiny remnant of the previously seen node and discontinue ADT 2. Discontinue ADT and repeat Axumin PET or PSMA when the PSA rises for possible SBRT 3. Continue ADT and repeat Axumin PET or PSMA when he becomes castration resistant for possible SBRT 4. Something else  I might be inclined to choose number 1, but, I am not sure if there is a preferred answer here, and there are pros and cons associated with each choice.  Below is a screenshot of the current PET on the left and 2019 PET on the right.     Impression/Plan:  In light of this information, we'll need to reconnect with Dr. Junious Silk and the patient to determine next steps.  ________________________________  Sheral Apley. Tammi Klippel, M.D.

## 2020-02-16 ENCOUNTER — Encounter: Payer: Self-pay | Admitting: Radiation Oncology

## 2020-02-17 ENCOUNTER — Encounter (HOSPITAL_COMMUNITY): Payer: Self-pay | Admitting: Licensed Clinical Social Worker

## 2020-02-17 ENCOUNTER — Ambulatory Visit (INDEPENDENT_AMBULATORY_CARE_PROVIDER_SITE_OTHER): Payer: Medicare Other | Admitting: Licensed Clinical Social Worker

## 2020-02-17 ENCOUNTER — Other Ambulatory Visit: Payer: Self-pay

## 2020-02-17 DIAGNOSIS — F411 Generalized anxiety disorder: Secondary | ICD-10-CM

## 2020-02-17 DIAGNOSIS — F331 Major depressive disorder, recurrent, moderate: Secondary | ICD-10-CM | POA: Diagnosis not present

## 2020-02-17 NOTE — Progress Notes (Signed)
Virtual Visit via Video Note  I connected with Rodney Friedman on 02/17/20 at  9:00 AM EDT by a video enabled telemedicine application and verified that I am speaking with the correct person using two identifiers.  Location: Patient: home Provider: office   I discussed the limitations of evaluation and management by telemedicine and the availability of in person appointments. The patient expressed understanding and agreed to proceed.   Type of Therapy: Individual Therapy   Treatment Goals addressed:  "to cope with stress and manage depression and anxiety". Cincere will report improved stress management and coping skills, resulting in mood stability and less worrying 4 out of 7 days.    Interventions: Motivational Interviewing    Summary: Yutaka Holberg is a 74 y.o. male who presents with Major Depressive Disorder, moderate and Generalized Anxiety Disorder   Suicidal/Homicidal: No without intent/plan   Therapist Response:   [name] met with clinician for individual therapy. Jermarcus discussed his psychiatric symptoms and current life events. Santonio reports he has been focusing his energy on his health lately. Clinician processed thoughts and feelings about health concerns, possible cancer, and a multitude of other medical appointments using [facility] O[facility]. Clinician reflected the increased anxiety about his health, which could eventually result in his death. Clinician explored the thought processes and encouraged Kolbi to ask as many questions and communicate as much as he can with his medical team. Clinician observed that he wants to trust his brilliant doctors, but also feels nervous because they can make mistakes. Clinician encouraged Jaxson to proceed with as many additional opinions as would make him feel comfortable.  Clinician processed community involvement, with his church, [employer] and [employer], and other activities. Clinician encouraged Jaasiel to fill his time with positive ventures in order to feel he has  some purpose in life.   Plan: Return again in 2 weeks.   Diagnosis: Axis I: Major depressive disorder, moderate and Generalized Anxiety Disorder    I discussed the assessment and treatment plan with the patient. The patient was provided an opportunity to ask questions and all were answered. The patient agreed with the plan and demonstrated an understanding of the instructions.   The patient was advised to call back or seek an in-person evaluation if the symptoms worsen or if the condition fails to improve as anticipated.  I provided 55 minutes of non-face-to-face time during this encounter.   [name], LCSW

## 2020-02-18 ENCOUNTER — Telehealth: Payer: Self-pay | Admitting: Urology

## 2020-02-18 NOTE — Telephone Encounter (Signed)
I received a message from [facility] requesting that I returned the patient's call to answer a few additional questions that he had about radiation.  I have made multiple attempts without an answer and no machine to leave a voice message.  I will await his return call or plan to answer his questions at the time of CT simulation on Friday, February 20, 2020.  Nicholos Johns, MMS, PA-C [place] at [place]: [phone]  Fax: [phone]

## 2020-02-18 NOTE — Telephone Encounter (Signed)
I connected with the patient by phone and answered a few additional questions that he had regarding the recommendation for proceeding with SBRT. All questions were answered to his stated satisfaction and he is in agreement to proceed as planned with CT SIM/treamtne planning on Friday, 02/20/20. We will sign formal, written consent to proceed at that time.  Nicholos Johns, MMS, PA-C [place] at [place]: [phone]  Fax: [phone]

## 2020-02-19 ENCOUNTER — Encounter: Payer: Self-pay | Admitting: Family Medicine

## 2020-02-19 ENCOUNTER — Encounter: Payer: Self-pay | Admitting: Hematology and Oncology

## 2020-02-20 ENCOUNTER — Ambulatory Visit
Admission: RE | Admit: 2020-02-20 | Discharge: 2020-02-20 | Disposition: A | Discharge status: Home / Self Care | Payer: Medicare Other | Source: Ambulatory Visit | Attending: Radiation Oncology | Admitting: Radiation Oncology

## 2020-02-20 ENCOUNTER — Encounter: Payer: Self-pay | Admitting: Medical Oncology

## 2020-02-20 ENCOUNTER — Other Ambulatory Visit: Payer: Self-pay

## 2020-02-20 DIAGNOSIS — Z510 Encounter for antineoplastic radiation therapy: Secondary | ICD-10-CM | POA: Diagnosis not present

## 2020-02-20 DIAGNOSIS — C61 Malignant neoplasm of prostate: Secondary | ICD-10-CM | POA: Insufficient documentation

## 2020-02-20 DIAGNOSIS — C775 Secondary and unspecified malignant neoplasm of intrapelvic lymph nodes: Secondary | ICD-10-CM | POA: Diagnosis not present

## 2020-02-20 NOTE — Progress Notes (Signed)
Met [name] and introduced myself as the prostate nurse navigator and discussed my role. No barrier to care identified and he will begin SBRT to pelvic lymph node 10/12. Encouraged him to reach out to me with questions or concerns.

## 2020-02-22 NOTE — Progress Notes (Signed)
  Radiation Oncology         [phone] ________________________________  Name: Shivan Hodes MRN: [id number]  Date: 02/20/2020  DOB: [birth year]  STEREOTACTIC BODY RADIOTHERAPY SIMULATION AND TREATMENT PLANNING NOTE    ICD-10-CM   1. Prostate cancer metastatic to intrapelvic lymph node ([place])  C61    C77.5   2. Prostate cancer [facility])  C61     DIAGNOSIS:  74 y.o. 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  NARRATIVE:  The patient was brought to the [facility].  Identity was confirmed.  All relevant records and images related to the planned course of therapy were reviewed.  The patient freely provided informed written consent to proceed with treatment after reviewing the details related to the planned course of therapy. The consent form was witnessed and verified by the simulation staff.  Then, the patient was set-up in a stable reproducible  supine position for radiation therapy.  A BodyFix immobilization pillow was fabricated for reproducible positioning.  Surface markings were placed.  The CT images were loaded into the planning software.  The gross target volumes (GTV) and planning target volumes (PTV) were delinieated, and avoidance structures were contoured.  Treatment planning then occurred.  The radiation prescription was entered and confirmed.  A total of two complex treatment devices were fabricated in the form of the BodyFix immobilization pillow and a neck accuform cushion.  I have requested : 3D Simulation  I have requested a DVH of the following structures: targets and all normal structures near the target including bowel and bladder as noted on the radiation plan to maintain doses in adherence with established limits  SPECIAL TREATMENT PROCEDURE:  The planned course of therapy  using radiation constitutes a special treatment procedure. Special care is required in the management of this patient for the following reasons. High dose per fraction requiring special monitoring for increased toxicities of treatment including daily imaging..  The special nature of the planned course of radiotherapy will require increased physician supervision and oversight to ensure patient's safety with optimal treatment outcomes.  PLAN:  The patient will receive 50 Gy in 5 fractions.  ________________________________  Sheral Apley Tammi Klippel, M.D.

## 2020-02-27 DIAGNOSIS — Z510 Encounter for antineoplastic radiation therapy: Secondary | ICD-10-CM | POA: Diagnosis not present

## 2020-02-27 DIAGNOSIS — C61 Malignant neoplasm of prostate: Secondary | ICD-10-CM | POA: Diagnosis not present

## 2020-02-27 DIAGNOSIS — C775 Secondary and unspecified malignant neoplasm of intrapelvic lymph nodes: Secondary | ICD-10-CM | POA: Diagnosis not present

## 2020-03-02 ENCOUNTER — Ambulatory Visit
Admission: RE | Admit: 2020-03-02 | Discharge: 2020-03-02 | Disposition: A | Discharge status: Home / Self Care | Payer: Medicare Other | Source: Ambulatory Visit | Attending: Radiation Oncology | Admitting: Radiation Oncology

## 2020-03-02 ENCOUNTER — Ambulatory Visit (INDEPENDENT_AMBULATORY_CARE_PROVIDER_SITE_OTHER): Payer: Medicare Other | Admitting: Licensed Clinical Social Worker

## 2020-03-02 ENCOUNTER — Other Ambulatory Visit: Payer: Self-pay

## 2020-03-02 ENCOUNTER — Encounter (HOSPITAL_COMMUNITY): Payer: Self-pay | Admitting: Licensed Clinical Social Worker

## 2020-03-02 ENCOUNTER — Encounter: Payer: Self-pay | Admitting: Medical Oncology

## 2020-03-02 DIAGNOSIS — F411 Generalized anxiety disorder: Secondary | ICD-10-CM | POA: Diagnosis not present

## 2020-03-02 DIAGNOSIS — F331 Major depressive disorder, recurrent, moderate: Secondary | ICD-10-CM | POA: Diagnosis not present

## 2020-03-02 DIAGNOSIS — Z510 Encounter for antineoplastic radiation therapy: Secondary | ICD-10-CM | POA: Diagnosis not present

## 2020-03-02 DIAGNOSIS — C61 Malignant neoplasm of prostate: Secondary | ICD-10-CM

## 2020-03-02 NOTE — Progress Notes (Signed)
   THERAPIST PROGRESS NOTE  Session Time: 9:00am-10:00am  Participation Level: Active  Behavioral Response: Well GroomedAlertDepressed  Type of Therapy: Individual Therapy  Treatment Goals addressed:  "to cope with stress and manage depression and anxiety". [name] will report improved stress management and coping skills, resulting in mood stability and less worrying 4 out of 7 days.    Interventions: Motivational Interviewing    Summary: Rodney Friedman is a 74 y.o. male who presents with Major Depressive Disorder, moderate and Generalized Anxiety Disorder   Suicidal/Homicidal: No without intent/plan   Therapist Response:   Rodney Friedman met with clinician for individual therapy. Rodney Friedman discussed his psychiatric symptoms and current life events. Rodney Friedman reports he will have 5 radiation treatments for a small area of cancer detected on recent scans. Clinician processed thoughts and feelings about this treatment. Rodney Friedman reports he has been through cancer treatment in the past and had no concerns about the outcomes. Clinician discussed mood and interactions since last session. Rodney Friedman identified feeling pretty depressed, continuing to mourn the loss of his father earlier this year. Clinician explored interactions with others and returning to church, AA and NA meetings. Rodney Friedman was able to identify the activities that bring him a little bit of enjoyment, but reports that he mostly sleeps his days away.   Plan: Return again in 2 weeks.   Diagnosis: Axis I: Major depressive disorder, moderate and Generalized Anxiety Disorder  [name], LCSW 03/02/2020

## 2020-03-03 ENCOUNTER — Ambulatory Visit: Payer: Medicare Other | Admitting: Radiation Oncology

## 2020-03-04 ENCOUNTER — Ambulatory Visit
Admission: RE | Admit: 2020-03-04 | Discharge: 2020-03-04 | Disposition: A | Discharge status: Home / Self Care | Payer: Medicare Other | Source: Ambulatory Visit | Attending: Radiation Oncology | Admitting: Radiation Oncology

## 2020-03-04 DIAGNOSIS — C775 Secondary and unspecified malignant neoplasm of intrapelvic lymph nodes: Secondary | ICD-10-CM

## 2020-03-04 DIAGNOSIS — Z510 Encounter for antineoplastic radiation therapy: Secondary | ICD-10-CM | POA: Diagnosis not present

## 2020-03-04 DIAGNOSIS — C61 Malignant neoplasm of prostate: Secondary | ICD-10-CM

## 2020-03-05 ENCOUNTER — Ambulatory Visit: Payer: Medicare Other | Admitting: Radiation Oncology

## 2020-03-08 ENCOUNTER — Ambulatory Visit: Payer: Medicare Other | Admitting: Radiation Oncology

## 2020-03-09 ENCOUNTER — Ambulatory Visit
Admission: RE | Admit: 2020-03-09 | Discharge: 2020-03-09 | Disposition: A | Discharge status: Home / Self Care | Payer: Medicare Other | Source: Ambulatory Visit | Attending: Radiation Oncology | Admitting: Radiation Oncology

## 2020-03-09 ENCOUNTER — Other Ambulatory Visit: Payer: Self-pay

## 2020-03-09 DIAGNOSIS — C61 Malignant neoplasm of prostate: Secondary | ICD-10-CM

## 2020-03-09 DIAGNOSIS — Z510 Encounter for antineoplastic radiation therapy: Secondary | ICD-10-CM | POA: Diagnosis not present

## 2020-03-10 ENCOUNTER — Ambulatory Visit: Payer: Medicare Other | Admitting: Radiation Oncology

## 2020-03-11 ENCOUNTER — Other Ambulatory Visit: Payer: Self-pay

## 2020-03-11 ENCOUNTER — Ambulatory Visit
Admission: RE | Admit: 2020-03-11 | Discharge: 2020-03-11 | Disposition: A | Discharge status: Home / Self Care | Payer: Medicare Other | Source: Ambulatory Visit | Attending: Radiation Oncology | Admitting: Radiation Oncology

## 2020-03-11 DIAGNOSIS — C61 Malignant neoplasm of prostate: Secondary | ICD-10-CM | POA: Diagnosis not present

## 2020-03-11 DIAGNOSIS — Z510 Encounter for antineoplastic radiation therapy: Secondary | ICD-10-CM | POA: Diagnosis not present

## 2020-03-11 DIAGNOSIS — C775 Secondary and unspecified malignant neoplasm of intrapelvic lymph nodes: Secondary | ICD-10-CM

## 2020-03-16 ENCOUNTER — Encounter: Payer: Self-pay | Admitting: Urology

## 2020-03-16 ENCOUNTER — Ambulatory Visit
Admission: RE | Admit: 2020-03-16 | Discharge: 2020-03-16 | Disposition: A | Discharge status: Home / Self Care | Payer: Medicare Other | Source: Ambulatory Visit | Attending: Radiation Oncology | Admitting: Radiation Oncology

## 2020-03-16 ENCOUNTER — Other Ambulatory Visit: Payer: Self-pay

## 2020-03-16 DIAGNOSIS — L0100 Impetigo, unspecified: Secondary | ICD-10-CM | POA: Diagnosis not present

## 2020-03-16 DIAGNOSIS — D225 Melanocytic nevi of trunk: Secondary | ICD-10-CM | POA: Diagnosis not present

## 2020-03-16 DIAGNOSIS — D2330 Other benign neoplasm of skin of unspecified part of face: Secondary | ICD-10-CM | POA: Diagnosis not present

## 2020-03-16 DIAGNOSIS — I872 Venous insufficiency (chronic) (peripheral): Secondary | ICD-10-CM | POA: Diagnosis not present

## 2020-03-16 DIAGNOSIS — L821 Other seborrheic keratosis: Secondary | ICD-10-CM | POA: Diagnosis not present

## 2020-03-16 DIAGNOSIS — L570 Actinic keratosis: Secondary | ICD-10-CM | POA: Diagnosis not present

## 2020-03-16 DIAGNOSIS — C775 Secondary and unspecified malignant neoplasm of intrapelvic lymph nodes: Secondary | ICD-10-CM | POA: Diagnosis not present

## 2020-03-16 DIAGNOSIS — C61 Malignant neoplasm of prostate: Secondary | ICD-10-CM | POA: Diagnosis not present

## 2020-03-16 DIAGNOSIS — Z510 Encounter for antineoplastic radiation therapy: Secondary | ICD-10-CM | POA: Diagnosis not present

## 2020-03-16 DIAGNOSIS — L814 Other melanin hyperpigmentation: Secondary | ICD-10-CM | POA: Diagnosis not present

## 2020-03-19 ENCOUNTER — Other Ambulatory Visit: Payer: Self-pay

## 2020-03-19 ENCOUNTER — Ambulatory Visit (INDEPENDENT_AMBULATORY_CARE_PROVIDER_SITE_OTHER): Payer: Medicare Other | Admitting: Psychiatry

## 2020-03-19 DIAGNOSIS — I25709 Atherosclerosis of coronary artery bypass graft(s), unspecified, with unspecified angina pectoris: Secondary | ICD-10-CM

## 2020-03-19 DIAGNOSIS — F339 Major depressive disorder, recurrent, unspecified: Secondary | ICD-10-CM | POA: Diagnosis not present

## 2020-03-19 MED ORDER — BUPROPION HCL ER (SR) 100 MG PO TB12: ORAL_TABLET | ORAL | 2 refills | Status: DC

## 2020-03-19 MED ORDER — BUPROPION HCL ER (SR) 200 MG PO TB12: ORAL_TABLET | ORAL | 3 refills | Status: DC

## 2020-03-19 MED ORDER — GABAPENTIN 100 MG PO CAPS: ORAL_CAPSULE | ORAL | 1 refills | Status: DC

## 2020-03-19 NOTE — Progress Notes (Signed)
Psychiatric Initial Adult Assessment   Patient Identification: Rodney Friedman MRN:  [id number] Date of Evaluation:  03/19/2020 Referral Source: Dr. Nyoka Cowden Chief Complaint: Need follow-up care   Visit Diagnosis: Bipolar disorder, substance use disorder Bipolar disorder  History of Present Illness:  Today the patient is doing fairly well.  He is having a hard time grieving the death of his father which was 7-1/2 months ago.  Patient continues in therapy here.  Overall his depression is not paralyzed.  He experiences grieving depression every day.  He cries almost every day.  He generally is sleeping and eating fairly well.  He himself is dealing with his last radiation treatment for his prostate cancer.  He also has an issue with his aortic valve probably the next year he will have surgery.  The patient drinks no alcohol.  He has been sober for 2 decades and continues to go to [place].  His thinking and concentration is good.  This patient has a son who lives in [place] which sees on a weekly basis.  He has a daughter who lives in [place] who has substance abuse issues.  She has 3 children one is in college 1 is with their father and 1 has been adopted by a family friend named [place].  Patient says $400 every month to [facility].  The patient financially is just about getting by.  With the death of his father's income dropped.  Today we talked about the possibility of having new relationships perhaps even a romantic relationship in the future.  Overall I do believe he is stable.  He certainly is not suicidal.  He is never had any psychotic symptomatology.  He does not demonstrate anhedonia.  He has a great relationship with his father and I think he has had some closure.   Substance Abuse History in the last 12 months:  Yes.    Consequences of Substance Abuse: NA  Past Medical History:  Past Medical History:  Diagnosis Date  . Adenomatous colon polyp   . Alcoholism ([place])   . Anxiety   . Atrial  fibrillation ([place])   . CAD (coronary artery disease)   . Cataract    removed left eye   . 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 Depression   . Diabetes mellitus without complication ([place])   . Diverticulosis   . Eye abnormality    Macular scarring R eye  . Glaucoma   . Heart murmur   . HLD (hyperlipidemia)   . Hypertension   . Leukemia ([place])    CLL  . Myocardial infarction ([place]) 2006  . Prostate cancer ([place]) 07/08/2013   [facility]- getting Lupron shot every 6 months - traces in prostate and 2 lymphnodes left hip- non focused traces per pt   . Sleep apnea   . Substance abuse ([place])   . 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 Arm Surgery Right    from door accident with glass  . CATARACT EXTRACTION W/ INTRAOCULAR LENS IMPLANT Left   . COLONOSCOPY    . 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 POLYPECTOMY      Family Psychiatric History:   Family History:  Family History  Problem Relation Age of Onset  . Colon cancer Mother   . Ovarian cancer Mother   . Uterine cancer Mother   . Hypertension Father   . Prostate cancer Father   . Colon polyps Neg Hx  Social History:   Social History   Socioeconomic History  . Marital status: Divorced    Spouse name: Not on file  . Number of children: 2  . Years of education: 49  . Highest education level: Not on file  Occupational History  . Occupation: Retired Pharmacist, hospital  Tobacco Use  . Smoking status: Current Every Day Smoker    Packs/day: 0.50    Years: 50.00    Pack years: 25.00    Types: Cigarettes  . Smokeless tobacco: Never Used  Vaping Use  . Vaping Use: Never used  Substance and Sexual Activity  . Alcohol use: No    Alcohol/week: 0.0 standard drinks  . Drug use: No  . Sexual activity: Not Currently  Other Topics Concern  . Not on file  Social History Narrative   Lives at home w/ his father   Divorced   Right-handed   Education:  Secretary/administrator   No caffeine   Social Determinants of Health   Financial Resource Strain:   . Difficulty of Paying Living Expenses: Not on file  Food Insecurity:   . Worried About Charity fundraiser in the Last Year: Not on file  . Ran Out of Food in the Last Year: Not on file  Transportation Needs:   . Lack of Transportation (Medical): Not on file  . Lack of Transportation (Non-Medical): Not on file  Physical Activity:   . Days of Exercise per Week: Not on file  . Minutes of Exercise per Session: Not on file  Stress:   . Feeling of Stress : Not on file  Social Connections:   . Frequency of Communication with Friends and Family: Not on file  . Frequency of Social Gatherings with Friends and Family: Not on file  . Attends Religious Services: Not on file  . Active Member of Clubs or Organizations: Not on file  . Attends Archivist Meetings: Not on file  . Marital Status: Not on file    Additional Social History:   Allergies:   Allergies  Allergen Reactions  . Other Rash    Allergen: "Plants and bushes while doing yard work"    Metabolic Disorder Labs: Lab Results  Component Value Date   HGBA1C 6.2 (H) 10/27/2019   MPG 117 02/11/2016   MPG 128 (H) 03/22/2015   No results found for: PROLACTIN Lab Results  Component Value Date   CHOL 123 10/27/2019   TRIG 158 (H) 10/27/2019   HDL 39 (L) 10/27/2019   CHOLHDL 3.2 10/27/2019   VLDL 41 (H) 03/10/2016   LDLCALC 57 10/27/2019   LDLCALC 48 03/13/2019     Current Medications: Current Outpatient Medications  Medication Sig Dispense Refill  . amLODipine (NORVASC) 10 MG tablet Take 1 tablet (10 mg total) by mouth daily. 90 tablet 3  . B Complex Vitamins (VITAMIN-B COMPLEX PO) Take by mouth.    Marland Kitchen BIOTIN PO Take by mouth.    Marland Kitchen buPROPion (WELLBUTRIN SR) 100 MG 12 hr tablet 1  qam   1  q diner 180 tablet 2  . Calcium Carbonate-Vit D-Min (CALCIUM 1200 PO) Take 1,200 mg by mouth daily.    Mariane Baumgarten Calcium (STOOL  SOFTENER PO) Take 1 tablet as needed by mouth.    . ezetimibe (ZETIA) 10 MG tablet TAKE 1 TABLET BY MOUTH EVERY DAY 90 tablet 3  . gabapentin (NEURONTIN) 100 MG capsule 3  qhs 270 capsule 1  . HYDROcodone-acetaminophen (NORCO/VICODIN) 5-325 MG tablet Take 1 tablet by  mouth every 6 (six) hours as needed.    Marland Kitchen ketoconazole (NIZORAL) 2 % shampoo Apply 1 application topically 2 (two) times a week. 120 mL 1  . latanoprost (XALATAN) 0.005 % ophthalmic solution Once a day every second day    . Leuprolide Acetate, 6 Month, (LUPRON) 45 MG injection Inject 45 mg into the muscle every 6 (six) months.    Marland Kitchen LYCOPENE PO Take 8 mg by mouth daily.    . metFORMIN (GLUCOPHAGE) 500 MG tablet Take 1 tablet (500 mg total) by mouth 2 (two) times daily with a meal. 180 tablet 1  . metoprolol (TOPROL-XL) 200 MG 24 hr tablet Take 0.5 tablets (100 mg total) by mouth 2 (two) times daily. Take with or immediately following a meal. 90 tablet 3  . Omega-3 Fatty Acids (FISH OIL) 1000 MG CAPS Take 1,000 mg by mouth daily.    . rosuvastatin (CRESTOR) 20 MG tablet Take 1 tablet (20 mg total) by mouth daily. 90 tablet 3  . valsartan (DIOVAN) 320 MG tablet Take 1 tablet (320 mg total) by mouth daily. 90 tablet 3  . vitamin B-12 (CYANOCOBALAMIN) 500 MCG tablet Take 500 mcg by mouth daily.    . vitamin C (ASCORBIC ACID) 500 MG tablet Take 500 mg by mouth daily.    Marland Kitchen warfarin (COUMADIN) 4 MG tablet TAKE 1/2 TO 1 TABLET DAILY AS DIRECTED BY COUMADIN CLINIC 90 tablet 1   Current Facility-Administered Medications  Medication Dose Route Frequency Provider Last Rate Last Admin  . 0.9 %  sodium chloride infusion  500 mL Intravenous Once Doran Stabler, MD        Neurologic: Headache: No Seizure: No Paresthesias:No  Musculoskeletal: Strength & Muscle Tone: within normal limits Gait & Station: normal Patient leans: Right  Psychiatric Specialty Exam: ROS  There were no vitals taken for this visit.There is no height or weight  on file to calculate BMI.  General Appearance: Casual  Eye Contact:  Good  Speech:  Clear and Coherent  Volume:  Normal  Mood:  NA  Affect:  Appropriate  Thought Process:  Goal Directed  Orientation:  NA  Thought Content:  WDL  Suicidal Thoughts:  No  Homicidal Thoughts:  No  Memory:  Negative  Judgement:  Good  Insight:  Good  Psychomotor Activity:  Normal  Concentration:    Recall:  [place] of Knowledge:Good  Language: Good  Akathisia:  No  Handed:  Right  AIMS (if indicated):    Assets:  Desire for Improvement  ADL's:  Intact  Cognition: WNL  Sleep:      Treatment Plan Summary: 10/29/20219:08 AM   Today the patient is doing fairly well.  His first problem is that of major depression.  Today we change his Wellbutrin from XL 300 mg which was more expensive than the Wellbutrin slow release.  She went from 300 mg XL to Wellbutrin 200 mg 1 in the morning and 1 at dinnertime.  His second problem is that of substance use disorder of alcohol.  Today we will continue going to AA and will clarify his Neurontin dose should be 100 mg 3 at night.  Generally patient is sleeping and eating well.  He will continue on one-to-one therapy.

## 2020-03-24 ENCOUNTER — Other Ambulatory Visit (HOSPITAL_COMMUNITY): Payer: Self-pay | Admitting: Psychiatry

## 2020-03-25 DIAGNOSIS — Z23 Encounter for immunization: Secondary | ICD-10-CM | POA: Diagnosis not present

## 2020-03-26 ENCOUNTER — Encounter: Payer: Self-pay | Admitting: Family Medicine

## 2020-03-30 ENCOUNTER — Encounter (HOSPITAL_COMMUNITY): Payer: Self-pay | Admitting: Licensed Clinical Social Worker

## 2020-03-30 ENCOUNTER — Ambulatory Visit (INDEPENDENT_AMBULATORY_CARE_PROVIDER_SITE_OTHER): Payer: Medicare Other | Admitting: Licensed Clinical Social Worker

## 2020-03-30 ENCOUNTER — Other Ambulatory Visit: Payer: Self-pay

## 2020-03-30 DIAGNOSIS — F411 Generalized anxiety disorder: Secondary | ICD-10-CM

## 2020-03-30 DIAGNOSIS — F339 Major depressive disorder, recurrent, unspecified: Secondary | ICD-10-CM

## 2020-03-30 NOTE — Progress Notes (Signed)
   THERAPIST PROGRESS NOTE  Session Time: 9:00am-10:00am  Participation Level: Active  Behavioral Response: CasualAlertDepressed and Irritable  Type of Therapy: Individual Therapy   Treatment Goals addressed:  "to cope with stress and manage depression and anxiety". [name] will report improved stress management and coping skills, resulting in mood stability and less worrying 4 out of 7 days.    Interventions: Motivational Interviewing    Summary: Rodney Friedman is a 74 y.o. male who presents with Major Depressive Disorder, moderate and Generalized Anxiety Disorder   Suicidal/Homicidal: No without intent/plan   Therapist Response:   [name] met with clinician for individual therapy. Rodney Friedman discussed his psychiatric symptoms and current life events. Rodney Friedman reports he feels continually frustrated with the little things he has to cope with in daily life. Clinician utilized MI OARS to reflect and summarize thoughts and feelings about passwords, logins, coping with his own lack of technological knowledge, as well as lack of assistance with said problems. Clinician explored options for upgrading his operating systems, but noted that this was "not in the budget". Clinician reflected the big changes in the world and current level of dependence on smart phones, which has Rodney Friedman feeling very insecure about his ability to cope in the future. Clinician discussed options for doing more things to get him out of the house and more involved in life. Clinician also touched briefly on health status, noting that radiation is now completed and he is doing fine.    Plan: Return again in 2 weeks.   Diagnosis: Axis I: Major depressive disorder, moderate and Generalized Anxiety Disorder     [name], LCSW 03/30/2020

## 2020-03-31 ENCOUNTER — Ambulatory Visit (INDEPENDENT_AMBULATORY_CARE_PROVIDER_SITE_OTHER): Payer: Medicare Other | Admitting: Cardiovascular Disease

## 2020-03-31 ENCOUNTER — Encounter: Payer: Self-pay | Admitting: Cardiovascular Disease

## 2020-03-31 ENCOUNTER — Telehealth: Payer: Self-pay

## 2020-03-31 ENCOUNTER — Other Ambulatory Visit: Payer: Self-pay

## 2020-03-31 VITALS — BP 136/54 | HR 83 | Ht 69.0 in | Wt 222.0 lb

## 2020-03-31 DIAGNOSIS — C61 Malignant neoplasm of prostate: Secondary | ICD-10-CM | POA: Diagnosis not present

## 2020-03-31 DIAGNOSIS — E669 Obesity, unspecified: Secondary | ICD-10-CM | POA: Diagnosis not present

## 2020-03-31 DIAGNOSIS — F17200 Nicotine dependence, unspecified, uncomplicated: Secondary | ICD-10-CM | POA: Diagnosis not present

## 2020-03-31 DIAGNOSIS — Z7901 Long term (current) use of anticoagulants: Secondary | ICD-10-CM | POA: Diagnosis not present

## 2020-03-31 DIAGNOSIS — I25118 Atherosclerotic heart disease of native coronary artery with other forms of angina pectoris: Secondary | ICD-10-CM

## 2020-03-31 DIAGNOSIS — I480 Paroxysmal atrial fibrillation: Secondary | ICD-10-CM

## 2020-03-31 DIAGNOSIS — C9110 Chronic lymphocytic leukemia of B-cell type not having achieved remission: Secondary | ICD-10-CM

## 2020-03-31 DIAGNOSIS — I10 Essential (primary) hypertension: Secondary | ICD-10-CM | POA: Diagnosis not present

## 2020-03-31 DIAGNOSIS — E782 Mixed hyperlipidemia: Secondary | ICD-10-CM

## 2020-03-31 DIAGNOSIS — I350 Nonrheumatic aortic (valve) stenosis: Secondary | ICD-10-CM

## 2020-03-31 DIAGNOSIS — E119 Type 2 diabetes mellitus without complications: Secondary | ICD-10-CM

## 2020-03-31 DIAGNOSIS — C775 Secondary and unspecified malignant neoplasm of intrapelvic lymph nodes: Secondary | ICD-10-CM

## 2020-03-31 DIAGNOSIS — G4733 Obstructive sleep apnea (adult) (pediatric): Secondary | ICD-10-CM

## 2020-03-31 DIAGNOSIS — I25709 Atherosclerosis of coronary artery bypass graft(s), unspecified, with unspecified angina pectoris: Secondary | ICD-10-CM

## 2020-03-31 MED ORDER — ROSUVASTATIN CALCIUM 20 MG PO TABS: 20.0000 mg | ORAL_TABLET | Freq: Every day | ORAL | 3 refills | Status: DC

## 2020-03-31 MED ORDER — METOPROLOL SUCCINATE ER 200 MG PO TB24: 100.0000 mg | ORAL_TABLET | Freq: Two times a day (BID) | ORAL | 3 refills | Status: DC

## 2020-03-31 MED ORDER — EZETIMIBE 10 MG PO TABS
10.0000 mg | ORAL_TABLET | Freq: Every day | ORAL | 3 refills | Status: DC
Start: 2020-03-31 — End: 2021-04-29

## 2020-03-31 MED ORDER — VALSARTAN 320 MG PO TABS
320.0000 mg | ORAL_TABLET | Freq: Every day | ORAL | 3 refills | Status: DC
Start: 2020-03-31 — End: 2020-12-20

## 2020-03-31 NOTE — Patient Instructions (Signed)
Medication Instructions:  No changes *If you need a refill on your cardiac medications before your next appointment, please call your pharmacy*   Lab Work: None ordered If you have labs (blood work) drawn today and your tests are completely normal, you will receive your results only by: MyChart Message (if you have MyChart) OR A paper copy in the mail If you have any lab test that is abnormal or we need to change your treatment, we will call you to review the results.   Testing/Procedures: Your physician has requested that you have an echocardiogram. Echocardiography is a painless test that uses sound waves to create images of your heart. It provides your doctor with information about the size and shape of your heart and how well your heart's chambers and valves are working. You may receive an ultrasound enhancing agent through an IV if needed to better visualize your heart during the echo.This procedure takes approximately one hour. There are no restrictions for this procedure. This will take place at the [address], [address].    Follow-Up: At [facility], you and your health needs are our priority.  As part of our continuing mission to provide you with exceptional heart care, we have created designated Provider Care Teams.  These Care Teams include your primary Cardiologist (physician) and Advanced Practice Providers (APPs -  Physician Assistants and Nurse Practitioners) who all work together to provide you with the care you need, when you need it.  We recommend signing up for the patient portal called "MyChart".  Sign up information is provided on this After Visit Summary.  MyChart is used to connect with patients for Virtual Visits (Telemedicine).  Patients are able to view lab/test results, encounter notes, upcoming appointments, etc.  Non-urgent messages can be sent to your provider as well.   To learn more about what you can do with MyChart, go to [email or web address].     Your next appointment:   12 month(s)  The format for your next appointment:   In Person  Provider:   You may see Mihai Croitoru, MD or one of the following Advanced Practice Providers on your designated Care Team:   Hao Meng, PA-C Angela Duke, PA-C or  Krista Kroeger, PA-C   

## 2020-03-31 NOTE — Progress Notes (Signed)
Cardiology Office Note    Date:  04/03/2020   ID:  Rodney Friedman, DOB 1945-12-01, MRN [id number]  PCP:  Wendie Agreste, MD  Cardiologist:   Sanda Klein, MD   chief complaint: CAD, AFib, AS, HTN, HLP   History of Present Illness:  Rodney Friedman is a 74 y.o. male with CAD and remote CABG, paroxysmal atrial fibrillation, mild aortic stenosis, carotid artery stenosis, hypertension, hyperlipidemia and type 2 diabetes mellitus.  He mows his own lawn with a self-propelled push mower, but has to stop every 10 or 15 minutes to catch his breath. Other activities do not cause dyspnea. He does not have  orthopnea, PND or lower extremity edema and he has not had palpitations. He is unaware of atrial fibrillation during the he was in this arrhythmia today, well rate controlled. He denies angina at rest or with activity. He has not had syncope falls injuries or bleeding problems.  Echocardiogram in May that showed moderate aortic stenosis, normal left ventricular systolic function.  He continues to smoke about 4 to 5 cigarettes a day. Improved HDL level which remains borderline low at 57. Glycemic control is good with a hemoglobin A1c of 6.2% which is a significant improvement.  His PSA showed an increase in the PET scan showed a positive lesion consistent with malignant lymph ration therapy and his PSA has decreased and the PET scan is now normal.  [name]'s father passed away about 6 months ago at age 10.  He was very dedicated to his father's care and misses him.  However, his level of stress has gone down tremendously. He  is well versed in the theoretical aspects of addiction since he has a Masters degree on the topic.  He has a history of coronary disease and underwent 3 vessel bypass surgery in [place] in 2006 ("heartburn" and left antecubital pain ) in July 2014 he had an episode of atrial fibrillation. He has significant left ventricular hypertrophy and very mild aortic valve stenosis.  He has preserved left ventricular systolic function although there is some degree of anterior hypokinesis by previous echo. His nuclear stress test in June 2013 showed normal pattern of perfusion and an ejection fraction of 52%. He has bilateral carotid bruits that are reportedly due to external carotid artery stenoses. His last carotid ultrasound was performed in September 2017 and showed no significant internal carotid stenosis.  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 He has treated prostate cancer and is followed by Dr. Alvy Bimler.  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 Unfortunately he still smokes roughly a few cigarettes a day , which he believes he needs due to anxiety.   Past Medical History:  Diagnosis Date  . Adenomatous colon polyp   . Alcoholism ([place])   . Anxiety   . Atrial fibrillation ([place])   . CAD (coronary artery disease)   . Cataract    removed left eye   . CLL (chronic lymphocytic leukemia) ([place]) 07/08/2013  . Depression   . Diabetes mellitus without complication ([place])   . Diverticulosis   . Eye abnormality    Macular scarring R eye  . Glaucoma   . Heart murmur   . HLD (hyperlipidemia)   . Hypertension   . Leukemia ([place])    CLL  . Myocardial infarction ([place]) 2006  . Prostate cancer ([place])  07/08/2013   [facility]- getting Lupron shot every 6 months - traces in prostate and 2 lymphnodes left hip- non focused traces per pt   . Sleep apnea   . Substance abuse ([place])   . 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 Arm Surgery Right    from door accident with glass  . CATARACT EXTRACTION W/ INTRAOCULAR LENS IMPLANT Left   . COLONOSCOPY    . 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 POLYPECTOMY      Current Medications: Outpatient Medications Prior to Visit  Medication Sig  Dispense Refill  . amLODipine (NORVASC) 10 MG tablet Take 1 tablet (10 mg total) by mouth daily. 90 tablet 3  . B Complex Vitamins (VITAMIN-B COMPLEX PO) Take by mouth.    Marland Kitchen BIOTIN PO Take by mouth.    Marland Kitchen buPROPion (WELLBUTRIN SR) 200 MG 12 hr tablet 1  qam   1  q diner 180 tablet 3  . Calcium Carbonate-Vit D-Min (CALCIUM 1200 PO) Take 1,200 mg by mouth daily.    Mariane Baumgarten Calcium (STOOL SOFTENER PO) Take 1 tablet as needed by mouth.    . gabapentin (NEURONTIN) 100 MG capsule 3  qhs 270 capsule 1  . HYDROcodone-acetaminophen (NORCO/VICODIN) 5-325 MG tablet Take 1 tablet by mouth every 6 (six) hours as needed.    Marland Kitchen ketoconazole (NIZORAL) 2 % shampoo Apply 1 application topically 2 (two) times a week. 120 mL 1  . latanoprost (XALATAN) 0.005 % ophthalmic solution Once a day every second day    . Leuprolide Acetate, 6 Month, (LUPRON) 45 MG injection Inject 45 mg into the muscle every 6 (six) months.    Marland Kitchen LYCOPENE PO Take 8 mg by mouth daily.    . metFORMIN (GLUCOPHAGE) 500 MG tablet Take 1 tablet (500 mg total) by mouth 2 (two) times daily with a meal. 180 tablet 1  . Omega-3 Fatty Acids (FISH OIL) 1000 MG CAPS Take 1,000 mg by mouth daily.    . vitamin B-12 (CYANOCOBALAMIN) 500 MCG tablet Take 500 mcg by mouth daily.    . vitamin C (ASCORBIC ACID) 500 MG tablet Take 500 mg by mouth daily.    Marland Kitchen warfarin (COUMADIN) 4 MG tablet TAKE 1/2 TO 1 TABLET DAILY AS DIRECTED BY COUMADIN CLINIC 90 tablet 1  . ezetimibe (ZETIA) 10 MG tablet TAKE 1 TABLET BY MOUTH EVERY DAY 90 tablet 3  . metoprolol (TOPROL-XL) 200 MG 24 hr tablet Take 0.5 tablets (100 mg total) by mouth 2 (two) times daily. Take with or immediately following a meal. 90 tablet 3  . rosuvastatin (CRESTOR) 20 MG tablet Take 1 tablet (20 mg total) by mouth daily. 90 tablet 3  . valsartan (DIOVAN) 320 MG tablet Take 1 tablet (320 mg total) by mouth daily. 90 tablet 3   Facility-Administered Medications Prior to Visit  Medication Dose Route  Frequency Provider Last Rate Last Admin  . 0.9 %  sodium chloride infusion  500 mL Intravenous Once Doran Stabler, MD         Allergies:   Other   Social History   Socioeconomic History  . Marital status: Divorced    Spouse name: Not on file  . Number of children: 2  . Years of education: 46  . Highest education level: Not on file  Occupational History  . Occupation: Retired Pharmacist, hospital  Tobacco Use  . Smoking status: Current Every Day Smoker  Packs/day: 0.50    Years: 50.00    Pack years: 25.00    Types: Cigarettes  . Smokeless tobacco: Never Used  Vaping Use  . Vaping Use: Never used  Substance and Sexual Activity  . Alcohol use: No    Alcohol/week: 0.0 standard drinks  . Drug use: No  . Sexual activity: Not Currently  Other Topics Concern  . Not on file  Social History Narrative   Lives at home w/ his father   Divorced   Right-handed   Education: Secretary/administrator   No caffeine   Social Determinants of Health   Financial Resource Strain:   . Difficulty of Paying Living Expenses: Not on file  Food Insecurity:   . Worried About Charity fundraiser in the Last Year: Not on file  . Ran Out of Food in the Last Year: Not on file  Transportation Needs:   . Lack of Transportation (Medical): Not on file  . Lack of Transportation (Non-Medical): Not on file  Physical Activity:   . Days of Exercise per Week: Not on file  . Minutes of Exercise per Session: Not on file  Stress:   . Feeling of Stress : Not on file  Social Connections:   . Frequency of Communication with Friends and Family: Not on file  . Frequency of Social Gatherings with Friends and Family: Not on file  . Attends Religious Services: Not on file  . Active Member of Clubs or Organizations: Not on file  . Attends Archivist Meetings: Not on file  . Marital Status: Not on file     Family History:  The patient's family history includes Colon cancer in his mother; Hypertension in his father; Ovarian  cancer in his mother; Prostate cancer in his father; Uterine cancer in his mother.   ROS:   Please see the history of present illness.   All other systems are reviewed and are negative.   PHYSICAL EXAM:   VS:  BP (!) 136/54 (BP Location: Left Arm, Patient Position: Sitting, Cuff Size: Large)   Pulse 83   Ht 5\' 9"  (1.753 m)   Wt 222 lb (100.7 kg)   BMI 32.78 kg/m      General: Alert, oriented x3, no distress, mildly obese Head: no evidence of trauma, PERRL, EOMI, no exophtalmos or lid lag, no myxedema, no xanthelasma; normal ears, nose and oropharynx Neck: normal jugular venous pulsations and no hepatojugular reflux; brisk carotid pulses without delay, but with bilateral carotid bruits Chest: clear to auscultation, no signs of consolidation by percussion or palpation, normal fremitus, symmetrical and full respiratory excursions Cardiovascular: normal position and quality of the apical impulse, irregular rhythm, normal first and second heart sounds, 3/6 aortic ejection murmur, no diastolic murmurs, rubs or gallops Abdomen: no tenderness or distention, no masses by palpation, no abnormal pulsatility or arterial bruits, normal bowel sounds, no hepatosplenomegaly Extremities: no clubbing, cyanosis or edema; 2+ radial, ulnar and brachial pulses bilaterally; 2+ right femoral, posterior tibial and dorsalis pedis pulses; 2+ left femoral, posterior tibial and dorsalis pedis pulses; no subclavian or femoral bruits Neurological: grossly nonfocal Psych: Normal mood and affect   Wt Readings from Last 3 Encounters:  03/31/20 222 lb (100.7 kg)  02/06/20 217 lb 6 oz (98.6 kg)  10/27/19 219 lb (99.3 kg)      Studies/Labs Reviewed:   ECHO 10/09/2019:  1. Left ventricular ejection fraction, by estimation, is 60 to 65%. The  left ventricle has normal function. The left ventricle  has no regional  wall motion abnormalities. There is moderate concentric left ventricular  hypertrophy. Left ventricular   diastolic parameters are consistent with Grade II diastolic dysfunction  (pseudonormalization). Elevated left ventricular end-diastolic pressure.  2. Right ventricular systolic function is normal. The right ventricular  size is normal.  3. Left atrial size was severely dilated.  4. The mitral valve is normal in structure. Trivial mitral valve  regurgitation. No evidence of mitral stenosis.  5. The aortic valve is normal in structure. Aortic valve regurgitation is  mild. Moderate aortic valve stenosis. Aortic regurgitation PHT measures  520 msec. Aortic valve area, by VTI measures 1.82 cm. Aortic valve mean  gradient measures 25.0 mmHg.  Aortic valve Vmax measures 3.29 m/s.  6. The inferior vena cava is normal in size with greater than 50%  respiratory variability, suggesting right atrial pressure of 3 mmHg.   EKG:  EKG is ordered today.  It shows atrial fibrillation, left ventricular hypertrophy with left axis deviation and mild broadening of QRS at 116 ms (no change), no ischemic repolarization abnormalities, QTC 493 ms Recent Labs: 10/27/2019: ALT 47; BUN 13; Creatinine, Ser 0.90; Hemoglobin 13.1; Platelets 211; Potassium 4.4; Sodium 144   Lipid Panel    Component Value Date/Time   CHOL 123 10/27/2019 0845   TRIG 158 (H) 10/27/2019 0845   HDL 39 (L) 10/27/2019 0845   CHOLHDL 3.2 10/27/2019 0845   CHOLHDL 5.8 (H) 03/10/2016 0830   VLDL 41 (H) 03/10/2016 0830   LDLCALC 57 10/27/2019 0845    ASSESSMENT:    1. Aortic valve stenosis, nonrheumatic   2. Paroxysmal atrial fibrillation (HCC)   3. Long term (current) use of anticoagulants   4. Coronary artery disease of native artery of native heart with stable angina pectoris ([place])   5. Mixed hyperlipidemia   6. Controlled type 2 diabetes mellitus without complication, without long-term current use of insulin (HCC)   7. Smoking   8. OSA (obstructive sleep apnea)   9. Mild obesity   10. Essential hypertension   11. CLL  (chronic lymphocytic leukemia) ([place])   12. Prostate cancer metastatic to intrapelvic lymph node ([place])      PLAN:  In order of problems listed above:  1. AFib: In atrial fibrillation today and completely unaware of the arrhythmia., Well rate controlled on the current dose of beta-blocker. CHADSVasc 4 (age, HTN, CAD, DM).   2. Warfarin:  no bleeding complications. Subtherapeutic INR today, warfarin dose adjusted. 3. CAD s/p CABG: He does not have angina when mowing the lawn, on 2 antianginal agents (amlodipine and metoprolol). 4. AS: Has progressed to moderate aortic stenosis. He should report worsening exercise tolerance due to angina, dyspnea or presyncope/syncope. He is not an appropriate candidate for redo sternotomy and aortic valve replacement, but I think would be a great candidate for TAVR if his anatomy permits. Reevaluate yearly with echo. 5. HLP: All lipid parameters are in target range on the current medications. 6. DM: Markedly improved glycemic control with A1c 6.2%. 7. Smoking: Smoking just a handful of cigarettes a day and unlikely to quit completely.Marland Kitchen  He is proud to report that he has been sober from alcohol and drugs for 18 years and still goes to [employer]. 8. Carotid bruits: Prominent carotid bruits are primarily due to external carotid stenoses.  He does not have any neurological complaints. 9. OSA: He did not tolerate CPAP. Denies any symptoms of daytime hypersomnolence. 10. Obesity: He lost about 10 pounds last year, but  unable to progress beyond that point this year. 11. HTN: Very good control. Avoid further reduction in systolic blood pressure. Diastolic blood pressure tends to run fairly low.  No medication changes today. 12. CLL: Unchanged white blood cell count around 16,000, not yet requiring treatment. No anemia or thrombocytopenia.  Followed by Dr. Alvy Bimler. 13. Prostate Ca: PSA undetectable on current medications. Required radiation therapy for pelvic  lymphadenopathy, with normalization of findings on PET scan.    Medication Adjustments/Labs and Tests Ordered: Current medicines are reviewed at length with the patient today.  Concerns regarding medicines are outlined above.  Medication changes, Labs and Tests ordered today are listed in the Patient Instructions below. Patient Instructions  Medication Instructions:  No changes *If you need a refill on your cardiac medications before your next appointment, please call your pharmacy*   Lab Work: None ordered If you have labs (blood work) drawn today and your tests are completely normal, you will receive your results only by: Marland Kitchen MyChart Message (if you have MyChart) OR . A paper copy in the mail If you have any lab test that is abnormal or we need to change your treatment, we will call you to review the results.   Testing/Procedures: Your physician has requested that you have an echocardiogram. Echocardiography is a painless test that uses sound waves to create images of your heart. It provides your doctor with information about the size and shape of your heart and how well your heart's chambers and valves are working. You may receive an ultrasound enhancing agent through an IV if needed to better visualize your heart during the echo.This procedure takes approximately one hour. There are no restrictions for this procedure. This will take place at the [address], [address].     Follow-Up: At [facility], you and your health needs are our priority.  As part of our continuing mission to provide you with exceptional heart care, we have created designated Provider Care Teams.  These Care Teams include your primary Cardiologist (physician) and Advanced Practice Providers (APPs -  Physician Assistants and Nurse Practitioners) who all work together to provide you with the care you need, when you need it.  We recommend signing up for the patient portal called "MyChart".  Sign up information  is provided on this After Visit Summary.  MyChart is used to connect with patients for Virtual Visits (Telemedicine).  Patients are able to view lab/test results, encounter notes, upcoming appointments, etc.  Non-urgent messages can be sent to your provider as well.   To learn more about what you can do with MyChart, go to [email or web address].    Your next appointment:   12 month(s)  The format for your next appointment:   In Person  Provider:   You may see Sanda Klein, MD or one of the following Advanced Practice Providers on your designated Care Team:    Almyra [name], PA-C  Fabian Sharp, [place] or   Roby Lofts, PA-C      Signed, Sanda Klein, MD  04/03/2020 9:57 PM    [facility] [address], [place], [place]  [address] Phone: [phone]; Fax: [phone]

## 2020-03-31 NOTE — Telephone Encounter (Signed)
Pt was seen in the office by cardiologist but they dismissed him prior to [place] being able to see him so I called and lmomed the pt to offer a courtesy visit for free sine he would've had a free visit today and we need to rectify the situation

## 2020-04-02 ENCOUNTER — Other Ambulatory Visit: Payer: Self-pay

## 2020-04-02 ENCOUNTER — Ambulatory Visit (INDEPENDENT_AMBULATORY_CARE_PROVIDER_SITE_OTHER): Payer: Medicare Other

## 2020-04-02 DIAGNOSIS — Z7901 Long term (current) use of anticoagulants: Secondary | ICD-10-CM

## 2020-04-02 DIAGNOSIS — Z5181 Encounter for therapeutic drug level monitoring: Secondary | ICD-10-CM

## 2020-04-02 DIAGNOSIS — I482 Chronic atrial fibrillation, unspecified: Secondary | ICD-10-CM

## 2020-04-02 LAB — POCT INR: INR: 1.7 — AB (ref 2.0–3.0)

## 2020-04-02 NOTE — Patient Instructions (Signed)
Take 1 tablet today and then Continue on same dosage 1 tablet daily except 1/2 tablet on Tuesdays and Fridays. Repeat INR in 8 weeks.

## 2020-04-08 DIAGNOSIS — C61 Malignant neoplasm of prostate: Secondary | ICD-10-CM | POA: Diagnosis not present

## 2020-04-12 DIAGNOSIS — C775 Secondary and unspecified malignant neoplasm of intrapelvic lymph nodes: Secondary | ICD-10-CM | POA: Diagnosis not present

## 2020-04-12 DIAGNOSIS — C61 Malignant neoplasm of prostate: Secondary | ICD-10-CM | POA: Diagnosis not present

## 2020-04-13 ENCOUNTER — Encounter (HOSPITAL_COMMUNITY): Payer: Self-pay | Admitting: Licensed Clinical Social Worker

## 2020-04-13 ENCOUNTER — Ambulatory Visit (INDEPENDENT_AMBULATORY_CARE_PROVIDER_SITE_OTHER): Payer: Medicare Other | Admitting: Licensed Clinical Social Worker

## 2020-04-13 ENCOUNTER — Other Ambulatory Visit: Payer: Self-pay

## 2020-04-13 DIAGNOSIS — F411 Generalized anxiety disorder: Secondary | ICD-10-CM | POA: Diagnosis not present

## 2020-04-13 DIAGNOSIS — F339 Major depressive disorder, recurrent, unspecified: Secondary | ICD-10-CM | POA: Diagnosis not present

## 2020-04-13 NOTE — Progress Notes (Signed)
   THERAPIST PROGRESS NOTE  Session Time: 9:00am-10:00am  Participation Level: Active  Behavioral Response: NeatAlertDepressed  Type of Therapy: Individual Therapy   Treatment Goals addressed:  "to cope with stress and manage depression and anxiety". Eitan will report improved stress management and coping skills, resulting in mood stability and less worrying 4 out of 7 days.    Interventions: Motivational Interviewing    Summary: Rodney Friedman is a 74 y.o. male who presents with Major Depressive Disorder, moderate and Generalized Anxiety Disorder   Suicidal/Homicidal: No without intent/plan   Therapist Response:   [name] met with clinician for individual therapy. [name] discussed his psychiatric symptoms and current life events. [name] reports he feels the ongoing daily routine of following up with his accounts, correcting other people's mistakes, etc. has taken a toll on him. Clinician utilized MI OARS to reflect and summarize. 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 Clinician noted that due to being retired and not having his parents to care for anymore, he has a new job of keeping up with these issues. Clinician validated frustration and explored different thought processes available to reduce the impact on his mental health.  Clinician discussed physical health concerns and processed worry about an upcoming heart surgery. Clinician noted [name]'s possibilities of having the surgery and living for many more years, or not having the surgery and possibly dying sooner. [name] identified pros and cons of both options, but agreed that being alive is better for everyone, especially his children and grandchildren.    Plan: Return again in 2 weeks.   Diagnosis: Axis I: Major depressive disorder, moderate and Generalized Anxiety Disorder  [name], LCSW 04/13/2020

## 2020-04-21 NOTE — Progress Notes (Signed)
Radiation Oncology         [phone] ________________________________  Name: Rodney Friedman MRN: [id number]  Date: 04/22/2020  DOB: May 12, 1946  Post Treatment Note  CC: Wendie Agreste, MD  Festus Aloe, MD  Diagnosis:   74 y.o. 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  Interval Since Last Radiation:  5.5 weeks  03/02/20 - 03/16/20:  The left iliac node target was treated to 50 Gy in 5 fractions of 10 Gy  Narrative:  I spoke with the patient to conduct his routine scheduled 1 month follow up visit via telephone to spare the patient unnecessary potential exposure in the healthcare setting during the current COVID-19 pandemic.  The patient was notified in advance and gave permission to proceed with this visit format.  He tolerated radiation treatment relatively well without any ill side effects                              On review of systems, the patient states that he continues to do well in general.  He denies any ill side effects associated with his recent SBRT treatments.  He specifically denies dysuria, gross hematuria, fever, chills, nausea, vomiting, diarrhea or constipation.  He denies abdominal pain and reports a healthy appetite, maintaining his weight.  He continues with chronic fatigue associated with LT-ADT and chronic CLL but reports this is unchanged recently.  His last Lupron injection was given on 01/13/2020. He did have a follow-up visit with Dr. Junious Silk on 04/12/2020 with labs prior which reflected his PSA remains undetectable.  Overall, he is pleased with his progress to date.  ALLERGIES:  is allergic to other.  Meds: Current Outpatient Medications  Medication Sig Dispense Refill  . amLODipine (NORVASC) 10 MG tablet Take 1 tablet (10 mg total) by mouth daily. 90 tablet 3   . B Complex Vitamins (VITAMIN-B COMPLEX PO) Take by mouth.    Marland Kitchen BIOTIN PO Take by mouth.    Marland Kitchen buPROPion (WELLBUTRIN SR) 200 MG 12 hr tablet 1  qam   1  q diner 180 tablet 3  . Calcium Carbonate-Vit D-Min (CALCIUM 1200 PO) Take 1,200 mg by mouth daily.    Mariane Baumgarten Calcium (STOOL SOFTENER PO) Take 1 tablet as needed by mouth.    . ezetimibe (ZETIA) 10 MG tablet Take 1 tablet (10 mg total) by mouth daily. 90 tablet 3  . gabapentin (NEURONTIN) 100 MG capsule 3  qhs 270 capsule 1  . HYDROcodone-acetaminophen (NORCO/VICODIN) 5-325 MG tablet Take 1 tablet by mouth every 6 (six) hours as needed.    Marland Kitchen ketoconazole (NIZORAL) 2 % shampoo Apply 1 application topically 2 (two) times a week. 120 mL 1  . latanoprost (XALATAN) 0.005 % ophthalmic solution Once a day every second day    . Leuprolide Acetate, 6 Month, (LUPRON) 45 MG injection Inject 45 mg into the muscle every 6 (six) months.    Marland Kitchen LYCOPENE PO Take 8 mg by mouth daily.    . metFORMIN (GLUCOPHAGE) 500 MG tablet Take 1 tablet (500 mg total) by mouth 2 (two) times daily with a meal. 180 tablet 1  . metoprolol (TOPROL-XL) 200 MG 24 hr tablet Take 0.5 tablets (100 mg total) by mouth 2 (two) times daily. Take with or immediately following a  meal. 90 tablet 3  . Omega-3 Fatty Acids (FISH OIL) 1000 MG CAPS Take 1,000 mg by mouth daily.    . rosuvastatin (CRESTOR) 20 MG tablet Take 1 tablet (20 mg total) by mouth daily. 90 tablet 3  . valsartan (DIOVAN) 320 MG tablet Take 1 tablet (320 mg total) by mouth daily. 90 tablet 3  . vitamin B-12 (CYANOCOBALAMIN) 500 MCG tablet Take 500 mcg by mouth daily.    . vitamin C (ASCORBIC ACID) 500 MG tablet Take 500 mg by mouth daily.    Marland Kitchen warfarin (COUMADIN) 4 MG tablet TAKE 1/2 TO 1 TABLET DAILY AS DIRECTED BY COUMADIN CLINIC 90 tablet 1   Current Facility-Administered Medications  Medication Dose Route Frequency Provider Last Rate Last Admin  . 0.9 %  sodium chloride infusion  500 mL Intravenous Once Doran Stabler, MD        Physical Findings:  vitals were not taken for this visit.   Karen Kays to assess due to telephone follow up visit..   Lab Findings: Lab Results  Component Value Date   WBC 16.3 (H) 10/27/2019   HGB 13.1 10/27/2019   HCT 38.2 10/27/2019   MCV 92 10/27/2019   PLT 211 10/27/2019     Radiographic Findings: No results found.  Impression/Plan: 1. 74 y.o. 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 He appears to have recovered well from the effects of his recent radiotherapy and is currently without complaints.  The plan is to remain off ADT and continue to monitor the PSA for stability since completion of radiotherapy.  We discussed that while we are happy to continue to participate in his care if clinically indicated in the future, at this point, we will plan to see him back on an as needed basis. He will continue in routine follow up under the care of Dr. Junious Silk for continued PSA monitoring and management of his systemic disease. He is comfortable and in agreement with the stated plan. We enjoyed taking care of him and look forward to continuing to follow his progress via correspondence.    Nicholos Johns, PA-C

## 2020-04-21 NOTE — Progress Notes (Signed)
  Radiation Oncology         [phone] ________________________________  Name: Rodney Friedman MRN: [id number]  Date: 03/16/2020  DOB: [birth year]  End of Treatment Note  Diagnosis:   74 y.o. 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     Indication for treatment:  Curative, Definitive SBRT       Radiation treatment dates:   03/02/20 - 03/16/20  Site/dose:   The left iliac node target was treated to 50 Gy in 5 fractions of 10 Gy  Beams/energy:   The patient was treated using stereotactic body radiotherapy according to a 3D conformal radiotherapy plan.  Volumetric arc fields were employed to deliver 6 MV X-rays.  Image guidance was performed with per fraction cone beam CT prior to treatment under personal MD supervision.  Immobilization was achieved using BodyFix Pillow.  Narrative: The patient tolerated radiation treatment relatively well without any ill side effects  Plan: The patient has completed radiation treatment. The patient will return to radiation oncology clinic for routine followup in one month. I advised them to call or return sooner if they have any questions or concerns related to their recovery or treatment. ________________________________  Sheral Apley. Tammi Klippel, M.D.

## 2020-04-22 ENCOUNTER — Encounter: Payer: Self-pay | Admitting: Hematology and Oncology

## 2020-04-22 ENCOUNTER — Other Ambulatory Visit: Payer: Self-pay

## 2020-04-22 ENCOUNTER — Encounter: Payer: Self-pay | Admitting: Medical Oncology

## 2020-04-22 ENCOUNTER — Encounter: Payer: Self-pay | Admitting: Urology

## 2020-04-22 ENCOUNTER — Encounter: Payer: Self-pay | Admitting: Family Medicine

## 2020-04-22 ENCOUNTER — Ambulatory Visit
Admission: RE | Admit: 2020-04-22 | Discharge: 2020-04-22 | Disposition: A | Discharge status: Home / Self Care | Payer: Medicare Other | Source: Ambulatory Visit | Attending: Urology | Admitting: Urology

## 2020-04-22 DIAGNOSIS — C775 Secondary and unspecified malignant neoplasm of intrapelvic lymph nodes: Secondary | ICD-10-CM

## 2020-04-22 DIAGNOSIS — C61 Malignant neoplasm of prostate: Secondary | ICD-10-CM

## 2020-04-22 NOTE — Progress Notes (Signed)
Patient states that he seen his urologist last month. States that he does not empty his bladder with urination.Reports a moderate stream. Reports some urgency with urination. Reports nocturia x3. States that he does not have to strain to start his stream.IPPS was a 31. Meaningful use is complete . Patient is aware that this  Is a phone visit.

## 2020-04-23 NOTE — Telephone Encounter (Signed)
FYI from pt as well as PSA reports

## 2020-04-26 ENCOUNTER — Encounter: Payer: Self-pay | Admitting: Family Medicine

## 2020-04-26 ENCOUNTER — Other Ambulatory Visit: Payer: Self-pay

## 2020-04-26 ENCOUNTER — Ambulatory Visit (INDEPENDENT_AMBULATORY_CARE_PROVIDER_SITE_OTHER): Payer: Medicare Other | Admitting: Family Medicine

## 2020-04-26 VITALS — BP 122/75 | HR 99 | Temp 97.1°F | Ht 69.0 in | Wt 220.0 lb

## 2020-04-26 DIAGNOSIS — E119 Type 2 diabetes mellitus without complications: Secondary | ICD-10-CM

## 2020-04-26 DIAGNOSIS — E785 Hyperlipidemia, unspecified: Secondary | ICD-10-CM

## 2020-04-26 DIAGNOSIS — C61 Malignant neoplasm of prostate: Secondary | ICD-10-CM | POA: Diagnosis not present

## 2020-04-26 DIAGNOSIS — C9110 Chronic lymphocytic leukemia of B-cell type not having achieved remission: Secondary | ICD-10-CM | POA: Diagnosis not present

## 2020-04-26 DIAGNOSIS — I25709 Atherosclerosis of coronary artery bypass graft(s), unspecified, with unspecified angina pectoris: Secondary | ICD-10-CM

## 2020-04-26 DIAGNOSIS — I10 Essential (primary) hypertension: Secondary | ICD-10-CM | POA: Diagnosis not present

## 2020-04-26 DIAGNOSIS — F339 Major depressive disorder, recurrent, unspecified: Secondary | ICD-10-CM

## 2020-04-26 DIAGNOSIS — L219 Seborrheic dermatitis, unspecified: Secondary | ICD-10-CM | POA: Diagnosis not present

## 2020-04-26 MED ORDER — METFORMIN HCL 500 MG PO TABS: 500.0000 mg | ORAL_TABLET | Freq: Two times a day (BID) | ORAL | 2 refills | Status: DC

## 2020-04-26 MED ORDER — AMLODIPINE BESYLATE 10 MG PO TABS: 10.0000 mg | ORAL_TABLET | Freq: Every day | ORAL | 3 refills | Status: DC

## 2020-04-26 MED ORDER — KETOCONAZOLE 2 % EX SHAM: 1.0000 "application " | MEDICATED_SHAMPOO | CUTANEOUS | 3 refills | Status: DC

## 2020-04-26 NOTE — Addendum Note (Signed)
Addended by: [name] R on: 04/26/2020 08:58 AM   Modules accepted: Level of Service

## 2020-04-26 NOTE — Patient Instructions (Addendum)
  No med changes today.  If any new symptoms, let me or your cardiologist know.  Keep follow-up with therapist and psychiatrist as planned.  If any increased thoughts of suicide, intent, or plan, call 911 or be seen in the emergency room.   Diabetes levels have been improving, I suspect we may be able to decrease Metformin to once per day but will let you know once I review your labs.  Thank you for coming in today and take care.  Let me know if there are questions prior to your next visit in 6 months.   If you have lab work done today you will be contacted with your lab results within the next 2 weeks.  If you have not heard from [place] then please contact us. The fastest way to get your results is to register for My Chart.   IF you received an x-ray today, you will receive an invoice from [facility]. Please contact [facility] at [phone] with questions or concerns regarding your invoice.   IF you received labwork today, you will receive an invoice from [facility]. Please contact [facility] at [phone] with questions or concerns regarding your invoice.   Our billing staff will not be able to assist you with questions regarding bills from these companies.  You will be contacted with the lab results as soon as they are available. The fastest way to get your results is to activate your My Chart account. Instructions are located on the last page of this paperwork. If you have not heard from [place] regarding the results in 2 weeks, please contact this office.

## 2020-04-26 NOTE — Progress Notes (Addendum)
Subjective:  Patient ID: Rodney Friedman, male    DOB: May 17, 1946  Age: 74 y.o. MRN: [id number]  CC:  Chief Complaint  Patient presents with  . Follow-up    on diabetes, hypertension and hyperlipidemia. Pt reports no issues with his BS since last OV. PT also no issues with BP since last OV. pt would like a refill on his medication  and for 90day supplies if possible.  Marland Kitchen FYI    up date from cancer doctor and an update from cardiology.    HPI Rodney Friedman presents for   Diabetes: Complicated by history of CAD, hyperglycemia previously.  He is on statin, ARB.  Metformin 500 mg twice daily. Microalbumin: Normal ratio 10/27/2019. Optho, foot exam, pneumovax: Ophthalmology June 28th, right cataract previously.  Scarred macula. Cataract procedure possible after visit in February.  No home readings.  No new side effects with metformin.  Lab Results  Component Value Date   HGBA1C 6.2 (H) 10/27/2019   HGBA1C 6.9 (H) 04/30/2019   HGBA1C 7.3 (H) 10/30/2018   Lab Results  Component Value Date   MICROALBUR 1.9 10/08/2014   LDLCALC 57 10/27/2019   CREATININE 0.90 10/27/2019   Hypertension: Followed by cardiology, Dr. Sallyanne Kuster,  history of CAD, aortic valve stenosis, paroxysmal A. fib on chronic anticoagulation, rate control with beta-blocker.  Yearly follow-up for echo for his moderate aortic stenosis - has echo next week.  No med changes at last cardiology visit in November. Amlodipine 10 mg daily, Toprol 200 mg daily, Diovan 320 mg daily. No new side effects with meds.  BP Readings from Last 3 Encounters:  04/26/20 122/75  03/31/20 (!) 136/54  02/06/20 125/75   Lab Results  Component Value Date   CREATININE 0.90 10/27/2019   Hyperlipidemia: Crestor 20 mg daily, Zetia 10 mg daily.  History of CAD as above.no new myalgias/side effects.  Fasting today. Small amt sugar in coffee only.  Lab Results  Component Value Date   CHOL 123 10/27/2019   HDL 39 (L) 10/27/2019   LDLCALC 57  10/27/2019   TRIG 158 (H) 10/27/2019   CHOLHDL 3.2 10/27/2019   Lab Results  Component Value Date   ALT 47 (H) 10/27/2019   AST 34 10/27/2019   ALKPHOS 43 (L) 10/27/2019   BILITOT 0.2 10/27/2019   Seborrheic dermatitis of scalp: Treated with ketoconazole shampoo 2 times per week rx - using about every 3rd day and head and shoulders. Saw his dermatologist recently. Had cryodestruction of R leg lesion recently.   Prostate Cancer, CLL.  Followed by Dr. Junious Silk with [facility].  Complicated by metastatic lymphadenopathy.  status post radiation, Lupron.  PSA less than 0.015 November 22, stopping Lupron and plan to follow PSA and testosterone.  75-month follow-up from 04/12/2020 visit.  Labs only at that time then follow-up in 6 months with urologist. Also with history of chronic lymphocytic leukemia, followed by Dr. Alvy Bimler. appt in March. Requests CBC.  Lab Results  Component Value Date   WBC 16.3 (H) 10/27/2019   HGB 13.1 10/27/2019   HCT 38.2 10/27/2019   MCV 92 10/27/2019   PLT 211 10/27/2019   Depression: Followed by psychiatry as well as therapist. Also meeting with [facility] sponsor.  Father passed about 10 months ago. Still struggling with this, grieving.  Financial stressors at times. Supporting son.  Feels like he has some rage issues. Various frustrations.  Has discussed with therapist, Janett Billow.  Has appt with psychiatry in 8 weeks, last visit 03/19/20.  Denies  any social needs at this time.  SI - daily fleeting thoughts, but does not see himself acting on it. No intent or plan.  No current suicidal ideation. Denies homicidal ideation. appt with therapist tomorrow - will discuss sx's then.   Depression screen [facility] 2/9 04/26/2020 10/27/2019 04/30/2019 04/07/2019 10/30/2018  Decreased Interest 1 3 0 2 0  Down, Depressed, Hopeless 2 3 0 2 0  PHQ - 2 Score 3 6 0 4 0  Altered sleeping 3 0 - 0 -  Tired, decreased energy 1 2 - 0 -  Change in appetite 1 2 - 0 -  Feeling bad or  failure about yourself  1 3 - 0 -  Trouble concentrating 0 0 - 0 -  Moving slowly or fidgety/restless 0 0 - 0 -  Suicidal thoughts 1 0 - 0 -  PHQ-9 Score 10 13 - 4 -  Difficult doing work/chores - - - Not difficult at all -  Some recent data might be hidden     History Patient Active Problem List   Diagnosis Date Noted  . Prostate cancer metastatic to intrapelvic lymph node ([place]) 02/09/2020  . Chronic pain 03/14/2019  . Other social stressor 07/27/2018  . Essential hypertension 07/27/2018  . Diabetes mellitus type 2 in obese ([place]) 07/27/2018  . Paroxysmal atrial fibrillation ([place]) 04/21/2016  . Diabetes mellitus ([place]) 04/21/2016  . OSA (obstructive sleep apnea) 04/21/2016  . Quality of life palliative care encounter 01/03/2016  . Bilateral carotid bruits without stenosis 01/02/2016  . Tremor, essential 09/22/2015  . Glaucoma, open angle 06/16/2015  . 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  . Pseudoaphakia 06/15/2015  . CD (contact dermatitis) 04/29/2014  . 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 Hematuria 01/05/2014  . Skin lesion 01/05/2014  . Aortic valve stenosis, nonrheumatic 01/03/2014  . Hypertensive heart disease 01/03/2014  . Mixed hyperlipidemia 01/03/2014  . Mild obesity 01/03/2014  . Tobacco abuse 01/03/2014  . Chronic atrial fibrillation ([place]) 12/31/2013  . Long term (current) use of anticoagulants 12/31/2013  . Cataract, nuclear 10/06/2013  . Dermatochalasis of eyelid 10/06/2013  . 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 Coronary artery disease of native artery of native heart with stable angina pectoris ([place]) 09/03/2013  . Pre-diabetes 09/03/2013  . CLL (chronic lymphocytic leukemia) ([place]) 07/08/2013  . Prostate cancer ([place]) 07/08/2013   Past Medical History:  Diagnosis Date  . Adenomatous colon polyp   . Alcoholism ([place])   . Anxiety   . Atrial fibrillation ([place])   . CAD (coronary artery disease)   . Cataract    removed left eye   . CLL  (chronic lymphocytic leukemia) ([place]) 07/08/2013  . Depression   . Diabetes mellitus without complication ([place])   . Diverticulosis   . Eye abnormality    Macular scarring R eye  . Glaucoma   . Heart murmur   . HLD (hyperlipidemia)   . Hypertension   . Leukemia ([place])    CLL  . Myocardial infarction ([place]) 2006  . Prostate cancer ([place]) 07/08/2013   [facility] at [facility]- getting Lupron shot every 6 months - traces in prostate and 2 lymphnodes left hip- non focused traces per pt   . Sleep apnea   . Substance abuse ([place])   . 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 Arm Surgery Right    from door accident with glass  . CATARACT EXTRACTION W/ INTRAOCULAR LENS IMPLANT Left   . COLONOSCOPY    .  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 POLYPECTOMY     Allergies  Allergen Reactions  . Other Rash    Allergen: "Plants and bushes while doing yard work"   Prior to Admission medications   Medication Sig Start Date End Date Taking? Authorizing Provider  amLODipine (NORVASC) 10 MG tablet Take 1 tablet (10 mg total) by mouth daily. 10/27/19   Wendie Agreste, MD  B Complex Vitamins (VITAMIN-B COMPLEX PO) Take by mouth.    [provider]  BIOTIN PO Take by mouth.    [provider]  buPROPion [facility] SR) 200 MG 12 hr tablet 1  qam   1  q diner 03/19/20   Plovsky, Berneta Sages, MD  Calcium Carbonate-Vit D-Min (CALCIUM 1200 PO) Take 1,200 mg by mouth daily.    [provider]  Docusate Calcium (STOOL SOFTENER PO) Take 1 tablet as needed by mouth.    [provider]  ezetimibe (ZETIA) 10 MG tablet Take 1 tablet (10 mg total) by mouth daily. 03/31/20   Croitoru, Mihai, MD  gabapentin (NEURONTIN) 100 MG capsule 3  qhs 03/19/20   Plovsky, Berneta Sages, MD  HYDROcodone-acetaminophen (NORCO/VICODIN) 5-325 MG tablet Take 1 tablet by mouth every 6 (six) hours as needed. Patient not taking: Reported on 04/22/2020 05/24/19   [provider]  ketoconazole (NIZORAL) 2 % shampoo Apply 1 application topically 2 (two) times a week. 06/18/17   Wendie Agreste, MD  latanoprost (XALATAN) 0.005 % ophthalmic solution Once a day every second day 06/30/13   [provider]  Leuprolide Acetate, 6 Month, (LUPRON) 45 MG injection Inject 45 mg into the muscle every 6 (six) months.    [provider]  LYCOPENE PO Take 8 mg by mouth daily.    [provider]  metFORMIN (GLUCOPHAGE) 500 MG tablet Take 1 tablet (500 mg total) by mouth 2 (two) times daily with a meal. 02/02/20   Wendie Agreste, MD  metoprolol (TOPROL-XL) 200 MG 24 hr tablet Take 0.5 tablets (100 mg total) by mouth 2 (two) times daily. Take with or immediately following a meal. 03/31/20   Croitoru, Mihai, MD  Omega-3 Fatty Acids (FISH OIL) 1000 MG CAPS Take 1,000 mg by mouth daily.    [provider]  rosuvastatin (CRESTOR) 20 MG tablet Take 1 tablet (20 mg total) by mouth daily. 03/31/20   Croitoru, Mihai, MD  valsartan (DIOVAN) 320 MG tablet Take 1 tablet (320 mg total) by mouth daily. 03/31/20   Croitoru, Mihai, MD  vitamin B-12 (CYANOCOBALAMIN) 500 MCG tablet Take 500 mcg by mouth daily.    [provider]  vitamin C (ASCORBIC ACID) 500 MG tablet Take 1,000 mg by mouth daily.     [provider]  warfarin (COUMADIN) 4 MG tablet TAKE 1/2 TO 1 TABLET DAILY AS DIRECTED BY COUMADIN CLINIC 06/09/19   Croitoru, Dani Gobble, MD   Social History   Socioeconomic History  . Marital status: Divorced    Spouse name: Not on file  . Number of children: 2  . Years of education: 32  . Highest education level: Not on file  Occupational History  . Occupation: Retired Pharmacist, hospital  Tobacco Use  . Smoking status: Current Every Day Smoker    Packs/day: 0.50    Years: 50.00    Pack years: 25.00    Types: Cigarettes  . Smokeless tobacco: Never Used  Vaping Use  . Vaping Use: Never used  Substance and Sexual Activity  . Alcohol use:  No     Alcohol/week: 0.0 standard drinks  . Drug use: No  . Sexual activity: Not Currently  Other Topics Concern  . Not on file  Social History Narrative   Lives at home w/ his father   Divorced   Right-handed   Education: Secretary/administrator   No caffeine   Social Determinants of Health   Financial Resource Strain:   . Difficulty of Paying Living Expenses: Not on file  Food Insecurity:   . Worried About Charity fundraiser in the Last Year: Not on file  . Ran Out of Food in the Last Year: Not on file  Transportation Needs:   . Lack of Transportation (Medical): Not on file  . Lack of Transportation (Non-Medical): Not on file  Physical Activity:   . Days of Exercise per Week: Not on file  . Minutes of Exercise per Session: Not on file  Stress:   . Feeling of Stress : Not on file  Social Connections:   . Frequency of Communication with Friends and Family: Not on file  . Frequency of Social Gatherings with Friends and Family: Not on file  . Attends Religious Services: Not on file  . Active Member of Clubs or Organizations: Not on file  . Attends Archivist Meetings: Not on file  . Marital Status: Not on file  Intimate Partner Violence:   . Fear of Current or Ex-Partner: Not on file  . Emotionally Abused: Not on file  . Physically Abused: Not on file  . Sexually Abused: Not on file    Review of Systems  Constitutional: Negative for fatigue and unexpected weight change.  Respiratory: Negative for cough, chest tightness and shortness of breath.   Cardiovascular: Negative for chest pain, palpitations and leg swelling.  Gastrointestinal: Negative for abdominal pain and blood in stool.  Neurological: Negative for dizziness, light-headedness and headaches.   Per HPI.   Objective:   Vitals:   04/26/20 0801  BP: 122/75  Pulse: 99  Temp: (!) 97.1 F (36.2 C)  TempSrc: Temporal  SpO2: 99%  Weight: 220 lb (99.8 kg)  Height: 5\' 9"  (1.753 m)     Physical Exam Vitals  reviewed.  Constitutional:      Appearance: He is well-developed.  HENT:     Head: Normocephalic and atraumatic.  Eyes:     Pupils: Pupils are equal, round, and reactive to light.  Neck:     Vascular: No carotid bruit or JVD.  Cardiovascular:     Rate and Rhythm: Normal rate. Rhythm irregular.     Heart sounds: Normal heart sounds. No murmur heard.   Pulmonary:     Effort: Pulmonary effort is normal.     Breath sounds: Normal breath sounds. No rales.  Skin:    General: Skin is warm and dry.  Neurological:     Mental Status: He is alert and oriented to person, place, and time.     46 minutes spent during visit, greater than 50% counseling and assimilation of information, chart review, and discussion of plan.     Assessment & Plan:  Jaxn Chiquito is a 74 y.o. male . CLL (chronic lymphocytic leukemia) ([place]) - Plan: CBC  -Check CBC, continue follow-up with hematology as planned  Essential hypertension - Plan: amLODipine (NORVASC) 10 MG tablet  -Stable, continue current regimen, refill of Norvasc given, others refilled recently by cardiology.  Seborrheic dermatitis of scalp - Plan: ketoconazole (NIZORAL) 2 % shampoo  -Controlled with intermittent dosing of  ketoconazole shampoo, refilled.  Type 2 diabetes mellitus without complication, without long-term current use of insulin (HCC) - Plan: ketoconazole (NIZORAL) 2 % shampoo, metFORMIN (GLUCOPHAGE) 500 MG tablet, Hemoglobin A1c  -Check A1c, anticipate increase to 100 daily.  Added on improvements.  Hyperlipidemia, unspecified hyperlipidemia type - Plan: Comprehensive metabolic panel, Lipid panel  -Tolerating current regimen, continue statin, Zetia.  Check labs  Prostate cancer [facility])  -Status post radiation, Lupron which is now on hold, 55-month follow-up for PSA, testosterone with urology, and 6 months with provider.  Episode of recurrent major depressive disorder, unspecified depression episode severity ([place])  -Complicated  by grieving fleeting thoughts of suicidal ideation as above, no intent or plan.  Has care team including therapist as well as psychiatrist, and has appointment with his therapist tomorrow.  Asked that he discuss his symptoms further at that time, but ER/911 precautions given.  Reviewed coping techniques and positive thinking, concept of managing stressors that may not have a resolution.    Meds ordered this encounter  Medications  . amLODipine (NORVASC) 10 MG tablet    Sig: Take 1 tablet (10 mg total) by mouth daily.    Dispense:  90 tablet    Refill:  3  . ketoconazole (NIZORAL) 2 % shampoo    Sig: Apply 1 application topically 2 (two) times a week.    Dispense:  120 mL    Refill:  3  . metFORMIN (GLUCOPHAGE) 500 MG tablet    Sig: Take 1 tablet (500 mg total) by mouth 2 (two) times daily with a meal.    Dispense:  180 tablet    Refill:  2   Patient Instructions    No med changes today.  If any new symptoms, let me or your cardiologist know.  Keep follow-up with therapist and psychiatrist as planned.  If any increased thoughts of suicide, intent, or plan, call 911 or be seen in the emergency room.   Diabetes levels have been improving, I suspect we may be able to decrease Metformin to once per day but will let you know once I review your labs.  Thank you for coming in today and take care.  Let me know if there are questions prior to your next visit in 6 months.   If you have lab work done today you will be contacted with your lab results within the next 2 weeks.  If you have not heard from [place] then please contact us. The fastest way to get your results is to register for My Chart.   IF you received an x-ray today, you will receive an invoice from [facility]. Please contact [facility] at [phone] with questions or concerns regarding your invoice.   IF you received labwork today, you will receive an invoice from [facility]. Please contact [facility] at [phone]  with questions or concerns regarding your invoice.   Our billing staff will not be able to assist you with questions regarding bills from these companies.  You will be contacted with the lab results as soon as they are available. The fastest way to get your results is to activate your My Chart account. Instructions are located on the last page of this paperwork. If you have not heard from [place] regarding the results in 2 weeks, please contact this office.         Signed, [name], MD [facility]

## 2020-04-27 ENCOUNTER — Ambulatory Visit (INDEPENDENT_AMBULATORY_CARE_PROVIDER_SITE_OTHER): Payer: Medicare Other | Admitting: Licensed Clinical Social Worker

## 2020-04-27 ENCOUNTER — Encounter: Payer: Self-pay | Admitting: Family Medicine

## 2020-04-27 DIAGNOSIS — F339 Major depressive disorder, recurrent, unspecified: Secondary | ICD-10-CM | POA: Diagnosis not present

## 2020-04-27 DIAGNOSIS — F411 Generalized anxiety disorder: Secondary | ICD-10-CM | POA: Diagnosis not present

## 2020-04-27 LAB — CBC
Hematocrit: 41.5 % (ref 37.5–51.0)
Hemoglobin: 14.2 g/dL (ref 13.0–17.7)
MCH: 31 pg (ref 26.6–33.0)
MCHC: 34.2 g/dL (ref 31.5–35.7)
MCV: 91 fL (ref 79–97)
Platelets: 201 10*3/uL (ref 150–450)
RBC: 4.58 x10E6/uL (ref 4.14–5.80)
RDW: 13.4 % (ref 11.6–15.4)
WBC: 14.8 10*3/uL — ABNORMAL HIGH (ref 3.4–10.8)

## 2020-04-27 LAB — COMPREHENSIVE METABOLIC PANEL
ALT: 46 IU/L — ABNORMAL HIGH (ref 0–44)
AST: 34 IU/L (ref 0–40)
Albumin/Globulin Ratio: 1.9 (ref 1.2–2.2)
Albumin: 4.1 g/dL (ref 3.7–4.7)
Alkaline Phosphatase: 48 IU/L (ref 44–121)
BUN/Creatinine Ratio: 14 (ref 10–24)
BUN: 15 mg/dL (ref 8–27)
Bilirubin Total: 0.4 mg/dL (ref 0.0–1.2)
CO2: 22 mmol/L (ref 20–29)
Calcium: 9.6 mg/dL (ref 8.6–10.2)
Chloride: 104 mmol/L (ref 96–106)
Creatinine, Ser: 1.08 mg/dL (ref 0.76–1.27)
GFR calc Af Amer: 79 mL/min/{1.73_m2} (ref >59)
GFR calc non Af Amer: 68 mL/min/{1.73_m2} (ref >59)
Globulin, Total: 2.2 g/dL (ref 1.5–4.5)
Glucose: 131 mg/dL — ABNORMAL HIGH (ref 65–99)
Potassium: 4.4 mmol/L (ref 3.5–5.2)
Sodium: 144 mmol/L (ref 134–144)
Total Protein: 6.3 g/dL (ref 6.0–8.5)

## 2020-04-27 LAB — LIPID PANEL
Chol/HDL Ratio: 3.2 ratio (ref 0.0–5.0)
Cholesterol, Total: 119 mg/dL (ref 100–199)
HDL: 37 mg/dL — ABNORMAL LOW (ref >39)
LDL Chol Calc (NIH): 60 mg/dL (ref 0–99)
Triglycerides: 120 mg/dL (ref 0–149)
VLDL Cholesterol Cal: 22 mg/dL (ref 5–40)

## 2020-04-27 LAB — HEMOGLOBIN A1C
Est. average glucose Bld gHb Est-mCnc: 143 mg/dL
Hgb A1c MFr Bld: 6.6 % — ABNORMAL HIGH (ref 4.8–5.6)

## 2020-04-29 ENCOUNTER — Encounter (HOSPITAL_COMMUNITY): Payer: Self-pay | Admitting: Licensed Clinical Social Worker

## 2020-04-29 NOTE — Progress Notes (Signed)
THERAPIST PROGRESS NOTE  Session Time: 10:00am-11:00am  Participation Level: Active  Behavioral Response: NeatAlertDepressed  Type of Therapy: Individual Therapy  Treatment Goals addressed:  "to cope with stress and manage depression and anxiety". Jacorion will report improved stress management and coping skills, resulting in mood stability and less worrying 4 out of 7 days.    Interventions: Motivational Interviewing    Summary: Deven Proano is a 74 y.o. male who presents with Major Depressive Disorder, moderate and Generalized Anxiety Disorder   Suicidal/Homicidal: No without intent/plan   Therapist Response:   Gerado met with clinician for individual therapy. Casen discussed his psychiatric symptoms and current life events. Elby reports he continues to feel a daily sense of dread and despair due to his finances, problems with technology, concerns about his son, and aging. Clinician utilized MI OARS to reflect and summarize ongoing problems. Clinician normalized most of these issues as problems that occur within this phase of life. Clinician identified the importance of making choices that encourage more positive thought and less fear. Clinician discussed alternative reactions to stress, such as a positive self statement or changing nervousness to excitement in his mind. Clinician processed health concerns and plans for a possible open heart surgery in the new year.    Plan: Return again in 2 weeks.   Diagnosis: Axis I: Major depressive disorder, moderate and Generalized Anxiety Disorder                      Jessica R Schlosberg, LCSW 04/29/2020  

## 2020-05-03 ENCOUNTER — Ambulatory Visit (HOSPITAL_COMMUNITY): Payer: Medicare Other | Attending: Cardiology

## 2020-05-03 ENCOUNTER — Other Ambulatory Visit: Payer: Self-pay

## 2020-05-03 DIAGNOSIS — I350 Nonrheumatic aortic (valve) stenosis: Secondary | ICD-10-CM | POA: Diagnosis not present

## 2020-05-03 LAB — ECHOCARDIOGRAM COMPLETE
AR max vel: 1.13 cm2
AV Area VTI: 1.2 cm2
AV Area mean vel: 1.09 cm2
AV Mean grad: 25.2 mmHg
AV Peak grad: 43.6 mmHg
Ao pk vel: 3.3 m/s
MV M vel: 5.06 m/s
MV Peak grad: 102.4 mmHg
P 1/2 time: 419 msec
S' Lateral: 3.1 cm

## 2020-05-03 MED ORDER — PERFLUTREN LIPID MICROSPHERE
1.0000 mL | INTRAVENOUS | Status: AC | PRN
  Administered 2020-05-03: 3 mL via INTRAVENOUS

## 2020-05-11 ENCOUNTER — Other Ambulatory Visit: Payer: Self-pay

## 2020-05-11 ENCOUNTER — Ambulatory Visit (INDEPENDENT_AMBULATORY_CARE_PROVIDER_SITE_OTHER): Payer: Medicare Other | Admitting: Licensed Clinical Social Worker

## 2020-05-11 ENCOUNTER — Encounter (HOSPITAL_COMMUNITY): Payer: Self-pay | Admitting: Licensed Clinical Social Worker

## 2020-05-11 DIAGNOSIS — F411 Generalized anxiety disorder: Secondary | ICD-10-CM | POA: Diagnosis not present

## 2020-05-11 DIAGNOSIS — F339 Major depressive disorder, recurrent, unspecified: Secondary | ICD-10-CM | POA: Diagnosis not present

## 2020-05-11 NOTE — Progress Notes (Signed)
   THERAPIST PROGRESS NOTE  Session Time: 8:00am-9:00am  Participation Level: Active  Behavioral Response: Well GroomedAlertDepressed  Type of Therapy: Individual Therapy   Treatment Goals addressed:  "to cope with stress and manage depression and anxiety". Rodney Friedman will report improved stress management and coping skills, resulting in mood stability and less worrying 4 out of 7 days.    Interventions: Motivational Interviewing    Summary: Rodney Friedman is a 74 y.o. male who presents with Major Depressive Disorder, moderate and Generalized Anxiety Disorder   Suicidal/Homicidal: No without intent/plan   Therapist Response:   Rodney Friedman met with clinician for individual therapy. Cledith discussed his psychiatric symptoms and current life events. Rodney Friedman reports he feels ongoing depression and grief due to his father's death 9 months ago. Clinician utilized MI OARS to reflect and summarize thoughts and feelings. Clinician provided time and space for Rodney Friedman to air his grievances and frustrations with his life. He identified ongoing concern about his son's financial future, his own health, and other ongoing daily stresses. Clinician validated frustration and sadness. Clinician discussed options for getting involved in the community, such as joining a Forensic scientist. Alize agreed that this would be positive and noted concern that he would not be able to be consistent due to his emotional and physical ailments. Clinician reflected the impact of anxiety and lack of motivation as the true barrier.  Clinician discussed coping skills and encouraged focus on silver linings.    Plan: Return again in 2 weeks.   Diagnosis: Axis I: Major depressive disorder, moderate and Generalized Anxiety Disorder                    [name], LCSW 05/11/2020

## 2020-05-20 ENCOUNTER — Telehealth: Payer: Self-pay | Admitting: *Deleted

## 2020-05-20 NOTE — Telephone Encounter (Signed)
Pt returning your call. Pt aware you will call back after lunch.

## 2020-05-20 NOTE — Telephone Encounter (Signed)
Schedule AWV.  

## 2020-05-28 ENCOUNTER — Ambulatory Visit (INDEPENDENT_AMBULATORY_CARE_PROVIDER_SITE_OTHER): Payer: Medicare Other

## 2020-05-28 ENCOUNTER — Other Ambulatory Visit: Payer: Self-pay

## 2020-05-28 DIAGNOSIS — Z7901 Long term (current) use of anticoagulants: Secondary | ICD-10-CM | POA: Diagnosis not present

## 2020-05-28 DIAGNOSIS — I482 Chronic atrial fibrillation, unspecified: Secondary | ICD-10-CM

## 2020-05-28 DIAGNOSIS — Z5181 Encounter for therapeutic drug level monitoring: Secondary | ICD-10-CM

## 2020-05-28 LAB — POCT INR: INR: 3.2 — AB (ref 2.0–3.0)

## 2020-05-28 NOTE — Patient Instructions (Signed)
Continue on same dosage 1 tablet daily except 1/2 tablet on Tuesdays and Fridays. Repeat INR in 8 weeks.  Eat spinach over the next 2 days

## 2020-06-01 ENCOUNTER — Encounter (HOSPITAL_COMMUNITY): Payer: Self-pay | Admitting: Licensed Clinical Social Worker

## 2020-06-01 ENCOUNTER — Ambulatory Visit (INDEPENDENT_AMBULATORY_CARE_PROVIDER_SITE_OTHER): Payer: Medicare Other | Admitting: Licensed Clinical Social Worker

## 2020-06-01 ENCOUNTER — Other Ambulatory Visit: Payer: Self-pay

## 2020-06-01 DIAGNOSIS — F411 Generalized anxiety disorder: Secondary | ICD-10-CM

## 2020-06-01 DIAGNOSIS — F331 Major depressive disorder, recurrent, moderate: Secondary | ICD-10-CM | POA: Diagnosis not present

## 2020-06-01 NOTE — Progress Notes (Signed)
   THERAPIST PROGRESS NOTE  Session Time: 9:00am-9:55am  Participation Level: Active  Behavioral Response: NeatAlertDepressed  Type of Therapy: Individual Therapy  Treatment Goals addressed:  "to cope with stress and manage depression and anxiety". Melanie will report improved stress management and coping skills, resulting in mood stability and less worrying 4 out of 7 days.    Interventions: Motivational Interviewing    Summary: Rodney Friedman is a 75 y.o. male who presents with Major Depressive Disorder, moderate and Generalized Anxiety Disorder   Suicidal/Homicidal: No without intent/plan   Therapist Response:   [name] met with clinician for individual therapy. [name] discussed his psychiatric symptoms and current life events. [name] reports he feels ongoing depression and grief due to the emptiness of his house following the death of father. He reports concerns about aging by himself with no one to care for him. Clinician processed thoughts and feelings using MI OARS. Clinician reflected [name]'s concerns, but also noted the importance of setting himself up for success in his older age by taking care of his physical and mental health. [name] shared loneliness as a significant trigger to his depression. Clinician explored options for meeting people, connecting with new friends, and asking others for help. Clinician discussed using his network through [place] and [employer] as a pool of acquaintances that may be available to meet, have a meal, and go to meetings together. Clinician noted projection when he said, "everyone says they are there to help, but when you ask, they won't do anything". Clinician identified the importance of asking and not assuming that the answer is no. 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 Clinician also brainstormed other options for work, such as tutoring or volunteering.    Plan: Return again in 2 weeks.   Diagnosis: Axis I:  Major depressive disorder, moderate and Generalized Anxiety Disorder                     [name], LCSW 06/01/2020

## 2020-06-11 ENCOUNTER — Other Ambulatory Visit: Payer: Self-pay

## 2020-06-11 ENCOUNTER — Telehealth (INDEPENDENT_AMBULATORY_CARE_PROVIDER_SITE_OTHER): Payer: Medicare Other | Admitting: Psychiatry

## 2020-06-11 DIAGNOSIS — F320 Major depressive disorder, single episode, mild: Secondary | ICD-10-CM

## 2020-06-11 MED ORDER — BUPROPION HCL ER (SR) 100 MG PO TB12: ORAL_TABLET | ORAL | 1 refills | Status: DC

## 2020-06-11 MED ORDER — GABAPENTIN 100 MG PO CAPS
ORAL_CAPSULE | ORAL | 1 refills | Status: DC
Start: 2020-06-11 — End: 2020-09-14

## 2020-06-11 NOTE — Progress Notes (Signed)
Psychiatric Initial Adult Assessment   Patient Identification: Rodney Friedman MRN:  [id number] Date of Evaluation:  06/11/2020 Referral Source: Dr. Nyoka Cowden Chief Complaint: Need follow-up care   Visit Diagnosis: Bipolar disorder, substance use disorder Bipolar disorder  History of Present Illness: Today the patient is fairly stable.  Financially he is doing okay.  He denies persistent depression.  He continues in therapy.  He takes his medicines just as prescribed.  He is sleeping and eating fairly well.  His energy level is good.  He continues in one-to-one therapy.  Today unfortunately were having a number of computer issues so it is hard to completely engage with him.  We had a shortened visit.  Overall he seems to be quite stable.  He has had his vaccines.  Generally he is healthy.    Substance Abuse History in the last 12 months:  Yes.    Consequences of Substance Abuse: NA  Past Medical History:  Past Medical History:  Diagnosis Date  . Adenomatous colon polyp   . Alcoholism ([place])   . Anxiety   . Atrial fibrillation ([place])   . CAD (coronary artery disease)   . Cataract    removed left eye   . 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 Depression   . Diabetes mellitus without complication ([place])   . Diverticulosis   . Eye abnormality    Macular scarring R eye  . Glaucoma   . Heart murmur   . HLD (hyperlipidemia)   . Hypertension   . Leukemia ([place])    CLL  . Myocardial infarction ([place]) 2006  . Prostate cancer ([place]) 07/08/2013   [facility]- getting Lupron shot every 6 months - traces in prostate and 2 lymphnodes left hip- non focused traces per pt   . Sleep apnea   . Substance abuse ([place])   . 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 Arm Surgery Right    from door accident with glass  . CATARACT EXTRACTION W/ INTRAOCULAR LENS IMPLANT Left   . COLONOSCOPY    . CORONARY ARTERY BYPASS GRAFT  08/04/2004   . POLYPECTOMY      Family Psychiatric History:   Family History:  Family History  Problem Relation Age of Onset  . Colon cancer Mother   . Ovarian cancer Mother   . Uterine cancer Mother   . Hypertension Father   . Prostate cancer Father   . Colon polyps Neg Hx     Social History:   Social History   Socioeconomic History  . Marital status: Divorced    Spouse name: Not on file  . Number of children: 2  . Years of education: 15  . Highest education level: Not on file  Occupational History  . Occupation: Retired Pharmacist, hospital  Tobacco Use  . Smoking status: Current Every Day Smoker    Packs/day: 0.50    Years: 50.00    Pack years: 25.00    Types: Cigarettes  . Smokeless tobacco: Never Used  Vaping Use  . Vaping Use: Never used  Substance and Sexual Activity  . Alcohol use: No    Alcohol/week: 0.0 standard drinks  . Drug use: No  . Sexual activity: Not Currently  Other Topics Concern  . Not on file  Social History Narrative   Lives at home w/ his father   Divorced   Right-handed   Education: College   No caffeine   Social Determinants of Health  Financial Resource Strain: Not on file  Food Insecurity: Not on file  Transportation Needs: Not on file  Physical Activity: Not on file  Stress: Not on file  Social Connections: Not on file    Additional Social History:   Allergies:   Allergies  Allergen Reactions  . Other Rash    Allergen: "Plants and bushes while doing yard work"    Metabolic Disorder Labs: Lab Results  Component Value Date   HGBA1C 6.6 (H) 04/26/2020   MPG 117 02/11/2016   MPG 128 (H) 03/22/2015   No results found for: PROLACTIN Lab Results  Component Value Date   CHOL 119 04/26/2020   TRIG 120 04/26/2020   HDL 37 (L) 04/26/2020   CHOLHDL 3.2 04/26/2020   VLDL 41 (H) 03/10/2016   LDLCALC 60 04/26/2020   LDLCALC 57 10/27/2019     Current Medications: Current Outpatient Medications  Medication Sig Dispense Refill  .  amLODipine (NORVASC) 10 MG tablet Take 1 tablet (10 mg total) by mouth daily. 90 tablet 3  . B Complex Vitamins (VITAMIN-B COMPLEX PO) Take by mouth.    Marland Kitchen BIOTIN PO Take by mouth.    Marland Kitchen buPROPion (WELLBUTRIN SR) 100 MG 12 hr tablet 2  qam   1  q diner 270 tablet 1  . Calcium Carbonate-Vit D-Min (CALCIUM 1200 PO) Take 1,200 mg by mouth daily.    Mariane Baumgarten Calcium (STOOL SOFTENER PO) Take 1 tablet as needed by mouth.    . ezetimibe (ZETIA) 10 MG tablet Take 1 tablet (10 mg total) by mouth daily. 90 tablet 3  . gabapentin (NEURONTIN) 100 MG capsule 3  qhs 270 capsule 1  . ketoconazole (NIZORAL) 2 % shampoo Apply 1 application topically 2 (two) times a week. 120 mL 3  . latanoprost (XALATAN) 0.005 % ophthalmic solution Once a day every second day    . Leuprolide Acetate, 6 Month, (LUPRON) 45 MG injection Inject 45 mg into the muscle every 6 (six) months.    Marland Kitchen LYCOPENE PO Take 8 mg by mouth daily.    . metFORMIN (GLUCOPHAGE) 500 MG tablet Take 1 tablet (500 mg total) by mouth 2 (two) times daily with a meal. 180 tablet 2  . metoprolol (TOPROL-XL) 200 MG 24 hr tablet Take 0.5 tablets (100 mg total) by mouth 2 (two) times daily. Take with or immediately following a meal. 90 tablet 3  . Omega-3 Fatty Acids (FISH OIL) 1000 MG CAPS Take 1,000 mg by mouth daily.    . rosuvastatin (CRESTOR) 20 MG tablet Take 1 tablet (20 mg total) by mouth daily. 90 tablet 3  . valsartan (DIOVAN) 320 MG tablet Take 1 tablet (320 mg total) by mouth daily. 90 tablet 3  . vitamin B-12 (CYANOCOBALAMIN) 500 MCG tablet Take 500 mcg by mouth daily.    . vitamin C (ASCORBIC ACID) 500 MG tablet Take 1,000 mg by mouth daily.     Marland Kitchen warfarin (COUMADIN) 4 MG tablet TAKE 1/2 TO 1 TABLET DAILY AS DIRECTED BY COUMADIN CLINIC 90 tablet 1   Current Facility-Administered Medications  Medication Dose Route Frequency Provider Last Rate Last Admin  . 0.9 %  sodium chloride infusion  500 mL Intravenous Once Doran Stabler, MD         Neurologic: Headache: No Seizure: No Paresthesias:No  Musculoskeletal: Strength & Muscle Tone: within normal limits Gait & Station: normal Patient leans: Right  Psychiatric Specialty Exam: ROS  There were no vitals taken for this visit.There is  no height or weight on file to calculate BMI.  General Appearance: Casual  Eye Contact:  Good  Speech:  Clear and Coherent  Volume:  Normal  Mood:  NA  Affect:  Appropriate  Thought Process:  Goal Directed  Orientation:  NA  Thought Content:  WDL  Suicidal Thoughts:  No  Homicidal Thoughts:  No  Memory:  Negative  Judgement:  Good  Insight:  Good  Psychomotor Activity:  Normal  Concentration:    Recall:  [place] of Knowledge:Good  Language: Good  Akathisia:  No  Handed:  Right  AIMS (if indicated):    Assets:  Desire for Improvement  ADL's:  Intact  Cognition: WNL  Sleep:      Treatment Plan Summary: 1/21/20229:16 AM   This patient's problems include major depression.  We will continue taking Wellbutrin 2 times a day.  The patient also has alcohol use disorder.  He will continue going to AA and continue Neurontin for this treatment as well.  He will continue in one-to-one therapy.

## 2020-06-15 ENCOUNTER — Other Ambulatory Visit: Payer: Self-pay

## 2020-06-15 ENCOUNTER — Encounter (HOSPITAL_COMMUNITY): Payer: Self-pay | Admitting: Licensed Clinical Social Worker

## 2020-06-15 ENCOUNTER — Ambulatory Visit (INDEPENDENT_AMBULATORY_CARE_PROVIDER_SITE_OTHER): Payer: Medicare Other | Admitting: Licensed Clinical Social Worker

## 2020-06-15 DIAGNOSIS — F411 Generalized anxiety disorder: Secondary | ICD-10-CM | POA: Diagnosis not present

## 2020-06-15 DIAGNOSIS — F331 Major depressive disorder, recurrent, moderate: Secondary | ICD-10-CM

## 2020-06-15 NOTE — Progress Notes (Signed)
   THERAPIST PROGRESS NOTE  Session Time: 9:00am-10:00am  Participation Level: Active  Behavioral Response: Well GroomedAlertDepressed  Type of Therapy: Individual Therapy    Treatment Goals addressed:  "to cope with stress and manage depression and anxiety". Pradeep will report improved stress management and coping skills, resulting in mood stability and less worrying 4 out of 7 days.    Interventions: Motivational Interviewing    Summary: Graciano Batson is a 75 y.o. male who presents with Major Depressive Disorder, moderate and Generalized Anxiety Disorder   Suicidal/Homicidal: No without intent/plan   Therapist Response:   [name] met with clinician for individual therapy. Azir discussed his psychiatric symptoms and current life events. Almon reports he continues to struggle with minor frustrations, such as making sure he is getting the correct price on his medications, managing technology, and getting his paperwork in order. Clinician spent a good amount of the session processing and identify coping skills in order to deal with everyday stressors. Clinician utilized MI OARS to reflect and summarize how frustrating these daily stressors are, and attempted to encourage more helpful coping skills. Larry shared that he has been able to get most of these minor tasks accomplished. However, clinician identified ongoing projection or expectation that the next frustrating thing is around the corner. Clinician encouraged [place]lay to think more positive thoughts and focus his energy on the good things happening in his life.    Plan: Return again in 2 weeks.   Diagnosis: Axis I: Major depressive disorder, moderate and Generalized Anxiety Disorder                     [name], LCSW 06/15/2020

## 2020-06-29 ENCOUNTER — Ambulatory Visit (INDEPENDENT_AMBULATORY_CARE_PROVIDER_SITE_OTHER): Payer: Medicare Other | Admitting: Licensed Clinical Social Worker

## 2020-06-29 ENCOUNTER — Other Ambulatory Visit: Payer: Self-pay

## 2020-06-29 ENCOUNTER — Encounter (HOSPITAL_COMMUNITY): Payer: Self-pay | Admitting: Licensed Clinical Social Worker

## 2020-06-29 DIAGNOSIS — F331 Major depressive disorder, recurrent, moderate: Secondary | ICD-10-CM | POA: Diagnosis not present

## 2020-06-29 DIAGNOSIS — F411 Generalized anxiety disorder: Secondary | ICD-10-CM

## 2020-06-29 NOTE — Progress Notes (Signed)
   THERAPIST PROGRESS NOTE  Session Time: 10:00am-10:55am  Participation Level: Active  Behavioral Response: CasualAlertIrritable  Type of Therapy: Individual Therapy     Treatment Goals addressed:  "to cope with stress and manage depression and anxiety". Rodney Friedman will report improved stress management and coping skills, resulting in mood stability and less worrying 4 out of 7 days.    Interventions: Motivational Interviewing, CBT    Summary: Rodney Friedman is a 75 y.o. male who presents with Major Depressive Disorder, moderate and Generalized Anxiety Disorder   Suicidal/Homicidal: No without intent/plan   Therapist Response:   Rodney Friedman met with Rodney Friedman for individual therapy. Rodney Friedman discussed his psychiatric symptoms and current life events. 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 Rodney Friedman applauded Rodney Friedman's hard work and explored his ability to be present in the moment with these tasks completed. Rodney Friedman shared that he was happy, but he also shared that he is certain that there will be more things to do in the near future. Rodney Friedman utilized MI OARS to reflect and summarize thoughts and feelings about his task list, as well as his own sxs. Rodney Friedman discussed coping skills and provided psychoeducation about what it means to cope vs just dealing with it. Rodney Friedman shared resources and encouraged Rodney Friedman to attempt to use deep breathing and taking breaks in order to reduce the impact of stress. Rodney Friedman also confronted Rodney Friedman on his internal creation of stressful thoughts using CBT. Rodney Friedman urged Rodney Friedman to catch his thoughts before they become toxic and change them to more positive ones or just neutral.    Plan: Return again in 2 weeks.   Diagnosis: Axis I: Major depressive disorder, moderate and Generalized Anxiety Disorder                       [name], LCSW 06/29/2020

## 2020-07-03 ENCOUNTER — Other Ambulatory Visit: Payer: Self-pay | Admitting: Cardiovascular Disease

## 2020-07-03 DIAGNOSIS — I25709 Atherosclerosis of coronary artery bypass graft(s), unspecified, with unspecified angina pectoris: Secondary | ICD-10-CM

## 2020-07-05 DIAGNOSIS — C61 Malignant neoplasm of prostate: Secondary | ICD-10-CM | POA: Diagnosis not present

## 2020-07-13 ENCOUNTER — Other Ambulatory Visit: Payer: Self-pay

## 2020-07-13 ENCOUNTER — Ambulatory Visit (INDEPENDENT_AMBULATORY_CARE_PROVIDER_SITE_OTHER): Payer: Medicare Other | Admitting: Licensed Clinical Social Worker

## 2020-07-13 DIAGNOSIS — F411 Generalized anxiety disorder: Secondary | ICD-10-CM

## 2020-07-13 DIAGNOSIS — F331 Major depressive disorder, recurrent, moderate: Secondary | ICD-10-CM | POA: Diagnosis not present

## 2020-07-14 ENCOUNTER — Encounter (HOSPITAL_COMMUNITY): Payer: Self-pay | Admitting: Licensed Clinical Social Worker

## 2020-07-14 NOTE — Progress Notes (Signed)
   THERAPIST PROGRESS NOTE  Session Time: 10:00am-10:55am  Participation Level: Active  Behavioral Response: NeatLethargicDepressed, Hopeless and Worthless  Type of Therapy: Individual Therapy  Treatment Goals addressed:  "to cope with stress and manage depression and anxiety". [place] will report improved stress management and coping skills, resulting in mood stability and less worrying 4 out of 7 days.    Interventions: Motivational Interviewing, CBT    Summary: Osualdo Pitera is a 74 y.o. male who presents with Major Depressive Disorder, moderate and Generalized Anxiety Disorder   Suicidal/Homicidal: Some thoughts without intent/plan   Therapist Response:   Ammon met with clinician for individual therapy. Adonnis discussed his psychiatric symptoms and current life events. Haden reports he touched poison ivy and now presented with very swollen eyes and cheeks. Clinician discussed treatment of this allergy and urged him to go to Urgent Care or his doctor for care. Kenyan agreed that he would seek medical treatment if OTC meds did not help. Clinician explored interactions, worries, and mood since last session. Kjell shared that he has been so depressed and borderline suicidal. Clinician processed these concerns and explored reasons why daily tasks have become so overwhelming. Clinician utilized MI OARS to reflect the feeling that "it's always something". Clinician noted the depression making it much more difficult to cope with daily irritants. Ryson spoke about missing his father, ongoing concerns about his children and grandchildren, and continued fear of financial ruin. Clinician utilized CBT to explore coping skills and encouraged [place] to return his thoughts to the AA teachings.    Plan: Return again in 2 weeks.   Diagnosis: Axis I: Major depressive disorder, moderate and Generalized Anxiety Disorder                      Jessica R Schlosberg, LCSW 07/14/2020  

## 2020-07-19 ENCOUNTER — Other Ambulatory Visit: Payer: Self-pay | Admitting: Hematology and Oncology

## 2020-07-19 DIAGNOSIS — Z961 Presence of intraocular lens: Secondary | ICD-10-CM | POA: Diagnosis not present

## 2020-07-19 DIAGNOSIS — C9110 Chronic lymphocytic leukemia of B-cell type not having achieved remission: Secondary | ICD-10-CM

## 2020-07-19 DIAGNOSIS — H31011 Macula scars of posterior pole (postinflammatory) (post-traumatic), right eye: Secondary | ICD-10-CM | POA: Diagnosis not present

## 2020-07-19 DIAGNOSIS — H401132 Primary open-angle glaucoma, bilateral, moderate stage: Secondary | ICD-10-CM | POA: Diagnosis not present

## 2020-07-19 DIAGNOSIS — H2511 Age-related nuclear cataract, right eye: Secondary | ICD-10-CM | POA: Diagnosis not present

## 2020-07-20 ENCOUNTER — Encounter: Payer: Self-pay | Admitting: Hematology and Oncology

## 2020-07-20 ENCOUNTER — Other Ambulatory Visit: Payer: Self-pay

## 2020-07-20 ENCOUNTER — Inpatient Hospital Stay: Payer: Medicare Other

## 2020-07-20 ENCOUNTER — Telehealth: Payer: Self-pay | Admitting: Hematology and Oncology

## 2020-07-20 ENCOUNTER — Inpatient Hospital Stay: Payer: Medicare Other | Attending: Hematology and Oncology | Admitting: Hematology and Oncology

## 2020-07-20 DIAGNOSIS — C9110 Chronic lymphocytic leukemia of B-cell type not having achieved remission: Secondary | ICD-10-CM | POA: Diagnosis present

## 2020-07-20 DIAGNOSIS — I25709 Atherosclerosis of coronary artery bypass graft(s), unspecified, with unspecified angina pectoris: Secondary | ICD-10-CM

## 2020-07-20 DIAGNOSIS — C61 Malignant neoplasm of prostate: Secondary | ICD-10-CM | POA: Insufficient documentation

## 2020-07-20 LAB — CBC WITH DIFFERENTIAL/PLATELET
Abs Immature Granulocytes: 0.04 10*3/uL (ref 0.00–0.07)
Basophils Absolute: 0.1 10*3/uL (ref 0.0–0.1)
Basophils Relative: 1 %
Eosinophils Absolute: 0.5 10*3/uL (ref 0.0–0.5)
Eosinophils Relative: 3 %
HCT: 42 % (ref 39.0–52.0)
Hemoglobin: 13.9 g/dL (ref 13.0–17.0)
Immature Granulocytes: 0 %
Lymphocytes Relative: 58 %
Lymphs Abs: 9.5 10*3/uL — ABNORMAL HIGH (ref 0.7–4.0)
MCH: 30.6 pg (ref 26.0–34.0)
MCHC: 33.1 g/dL (ref 30.0–36.0)
MCV: 92.5 fL (ref 80.0–100.0)
Monocytes Absolute: 1 10*3/uL (ref 0.1–1.0)
Monocytes Relative: 6 %
Neutro Abs: 5.2 10*3/uL (ref 1.7–7.7)
Neutrophils Relative %: 32 %
Platelets: 220 10*3/uL (ref 150–400)
RBC: 4.54 MIL/uL (ref 4.22–5.81)
RDW: 12.8 % (ref 11.5–15.5)
WBC: 16.4 10*3/uL — ABNORMAL HIGH (ref 4.0–10.5)
nRBC: 0 % (ref 0.0–0.2)

## 2020-07-20 NOTE — Assessment & Plan Note (Signed)
His recent PSA was undetectable I would defer to his urologist for further management

## 2020-07-20 NOTE — Telephone Encounter (Signed)
Scheduled appts per 3/1 los. Gave pt a print out of AVS.

## 2020-07-20 NOTE — Assessment & Plan Note (Signed)
He has no clinical signs of disease progression I will continue to see him once a year 

## 2020-07-20 NOTE — Progress Notes (Signed)
[facility] OFFICE PROGRESS NOTE  Patient Care Team: Wendie Agreste, MD as PCP - General (Family Medicine) Croitoru, Dani Gobble, MD as PCP - Cardiology (Cardiology) Kathie Rhodes, MD (Inactive) as Consulting Physician (Urology) Croitoru, Dani Gobble, MD as Consulting Physician (Cardiology)  ASSESSMENT & PLAN:  CLL (chronic lymphocytic leukemia) He has no clinical signs of disease progression I will continue to see him once a year  Prostate cancer His recent PSA was undetectable I would defer to his urologist for further management   No orders of the defined types were placed in this encounter.   All questions were answered. The patient knows to call the clinic with any problems, questions or concerns. The total time spent in the appointment was 20 minutes encounter with patients including review of chart and various tests results, discussions about plan of care and coordination of care plan   Heath Lark, MD 07/20/2020 10:21 AM  INTERVAL HISTORY: Please see below for problem oriented charting. He returns for further follow-up for CLL The patient has stopped Lupron injection more than 6 months ago His last 2 PSA in November and most recently on July 05, 2020 was undetectable He is doing well Denies urinary symptoms No recent infection, fever or chills No new lymphadenopathy The patient is adjusting his life since the death of his father recently  SUMMARY OF ONCOLOGIC HISTORY: Oncology History Overview Note  Prostate cancer   Primary site: Prostate (Left)   Staging method: AJCC 7th Edition   Clinical: Stage IIB (T1c, N0, M0) signed by Heath Lark, MD on 07/09/2013 10:04 AM   Summary: Stage IIB (T1c, N0, M0)  CLL stage I. FISH normal   CLL (chronic lymphocytic leukemia) ([place])  07/08/2013 Pathology Results   Peripheral Blood Flow Cytometry - MONOCLONAL B-CELL POPULATION IDENTIFIED. Diagnosis Comment: The phenotypic features are consistent with chronic lymphocytic  leukemia. Clinical correlation is recommended.    07/08/2013 Pathology Results   FISH panel is normal   07/20/2020 Cancer Staging   Staging form: Chronic Lymphocytic Leukemia / Small Lymphocytic Lymphoma, AJCC 8th Edition - Clinical stage from 07/20/2020: Modified Rai Stage 0 (Modified Rai risk: Low, Lymphocytosis: Present, Adenopathy: Absent, Organomegaly: Absent, Anemia: Absent, Thrombocytopenia: Absent) - Signed by Heath Lark, MD on 07/20/2020   Prostate cancer ([place])  07/12/2012 Tumor Marker   PSA was high at 9.6   09/30/2012 Procedure   He underwent prostate biopsy at confirm diagnosis of prostate cancer.   11/11/2012 - 01/09/2013 Radiation Therapy   The patient completed radiation therapy to his prostate using IM RT technique   06/22/2017 PET scan   PET scan showed left external LN involvement along with activity within prostate     REVIEW OF SYSTEMS:   Constitutional: Denies fevers, chills or abnormal weight loss Eyes: Denies blurriness of vision Ears, nose, mouth, throat, and face: Denies mucositis or sore throat Respiratory: Denies cough, dyspnea or wheezes Cardiovascular: Denies palpitation, chest discomfort or lower extremity swelling Gastrointestinal:  Denies nausea, heartburn or change in bowel habits Skin: Denies abnormal skin rashes Lymphatics: Denies new lymphadenopathy or easy bruising Neurological:Denies numbness, tingling or new weaknesses Behavioral/Psych: Mood is stable, no new changes  All other systems were reviewed with the patient and are negative.  I have reviewed the past medical history, past surgical history, social history and family history with the patient and they are unchanged from previous note.  ALLERGIES:  is allergic to other.  MEDICATIONS:  Current Outpatient Medications  Medication Sig Dispense Refill  . amLODipine (  NORVASC) 10 MG tablet Take 1 tablet (10 mg total) by mouth daily. 90 tablet 3  . B Complex Vitamins (VITAMIN-B COMPLEX PO) Take by  mouth.    Marland Kitchen BIOTIN PO Take by mouth.    Marland Kitchen buPROPion (WELLBUTRIN SR) 100 MG 12 hr tablet 2  qam   1  q diner 270 tablet 1  . Calcium Carbonate-Vit D-Min (CALCIUM 1200 PO) Take 1,200 mg by mouth daily.    Mariane Baumgarten Calcium (STOOL SOFTENER PO) Take 1 tablet as needed by mouth.    . ezetimibe (ZETIA) 10 MG tablet Take 1 tablet (10 mg total) by mouth daily. 90 tablet 3  . gabapentin (NEURONTIN) 100 MG capsule 3  qhs 270 capsule 1  . ketoconazole (NIZORAL) 2 % shampoo Apply 1 application topically 2 (two) times a week. 120 mL 3  . latanoprost (XALATAN) 0.005 % ophthalmic solution Once a day every second day    . LYCOPENE PO Take 8 mg by mouth daily.    . metFORMIN (GLUCOPHAGE) 500 MG tablet Take 1 tablet (500 mg total) by mouth 2 (two) times daily with a meal. 180 tablet 2  . metoprolol (TOPROL-XL) 200 MG 24 hr tablet Take 0.5 tablets (100 mg total) by mouth 2 (two) times daily. Take with or immediately following a meal. 90 tablet 3  . Omega-3 Fatty Acids (FISH OIL) 1000 MG CAPS Take 1,000 mg by mouth daily.    . rosuvastatin (CRESTOR) 20 MG tablet Take 1 tablet (20 mg total) by mouth daily. 90 tablet 3  . valsartan (DIOVAN) 320 MG tablet Take 1 tablet (320 mg total) by mouth daily. 90 tablet 3  . vitamin B-12 (CYANOCOBALAMIN) 500 MCG tablet Take 500 mcg by mouth daily.    . vitamin C (ASCORBIC ACID) 500 MG tablet Take 1,000 mg by mouth daily.     Marland Kitchen warfarin (COUMADIN) 4 MG tablet TAKE 1/2 TO 1 TABLET DAILY AS DIRECTED BY COUMADIN CLINIC 90 tablet 1   Current Facility-Administered Medications  Medication Dose Route Frequency Provider Last Rate Last Admin  . 0.9 %  sodium chloride infusion  500 mL Intravenous Once Doran Stabler, MD        PHYSICAL EXAMINATION: ECOG PERFORMANCE STATUS: 0 - Asymptomatic  Vitals:   07/20/20 0830  BP: (!) 114/59  Pulse: 91  Resp: 18  Temp: (!) 97 F (36.1 C)  SpO2: 100%   Filed Weights   07/20/20 0830  Weight: 217 lb 9.6 oz (98.7 kg)     GENERAL:alert, no distress and comfortable Musculoskeletal:no cyanosis of digits and no clubbing  NEURO: alert & oriented x 3 with fluent speech, no focal motor/sensory deficits  LABORATORY DATA:  I have reviewed the data as listed    Component Value Date/Time   NA 144 04/26/2020 0941   NA 141 01/04/2015 0808   K 4.4 04/26/2020 0941   K 4.3 01/04/2015 0808   CL 104 04/26/2020 0941   CO2 22 04/26/2020 0941   CO2 21 (L) 01/04/2015 0808   GLUCOSE 131 (H) 04/26/2020 0941   GLUCOSE 89 03/10/2016 0830   GLUCOSE 115 01/04/2015 0808   BUN 15 04/26/2020 0941   BUN 18.7 01/04/2015 0808   CREATININE 1.08 04/26/2020 0941   CREATININE 1.16 03/10/2016 0830   CREATININE 0.9 01/04/2015 0808   CALCIUM 9.6 04/26/2020 0941   CALCIUM 9.0 01/04/2015 0808   PROT 6.3 04/26/2020 0941   PROT 6.3 (L) 01/04/2015 0808   ALBUMIN 4.1 04/26/2020 0941  ALBUMIN 3.7 01/04/2015 0808   AST 34 04/26/2020 0941   AST 19 01/04/2015 0808   ALT 46 (H) 04/26/2020 0941   ALT 33 01/04/2015 0808   ALKPHOS 48 04/26/2020 0941   ALKPHOS 47 01/04/2015 0808   BILITOT 0.4 04/26/2020 0941   BILITOT 0.23 01/04/2015 0808   GFRNONAA 68 04/26/2020 0941   GFRNONAA 70 02/11/2016 1526   GFRAA 79 04/26/2020 0941   GFRAA 81 02/11/2016 1526    No results found for: SPEP, UPEP  Lab Results  Component Value Date   WBC 16.4 (H) 07/20/2020   NEUTROABS 5.2 07/20/2020   HGB 13.9 07/20/2020   HCT 42.0 07/20/2020   MCV 92.5 07/20/2020   PLT 220 07/20/2020      Chemistry      Component Value Date/Time   NA 144 04/26/2020 0941   NA 141 01/04/2015 0808   K 4.4 04/26/2020 0941   K 4.3 01/04/2015 0808   CL 104 04/26/2020 0941   CO2 22 04/26/2020 0941   CO2 21 (L) 01/04/2015 0808   BUN 15 04/26/2020 0941   BUN 18.7 01/04/2015 0808   CREATININE 1.08 04/26/2020 0941   CREATININE 1.16 03/10/2016 0830   CREATININE 0.9 01/04/2015 0808      Component Value Date/Time   CALCIUM 9.6 04/26/2020 0941   CALCIUM 9.0  01/04/2015 0808   ALKPHOS 48 04/26/2020 0941   ALKPHOS 47 01/04/2015 0808   AST 34 04/26/2020 0941   AST 19 01/04/2015 0808   ALT 46 (H) 04/26/2020 0941   ALT 33 01/04/2015 0808   BILITOT 0.4 04/26/2020 0941   BILITOT 0.23 01/04/2015 2355

## 2020-07-21 ENCOUNTER — Other Ambulatory Visit: Payer: Self-pay | Admitting: Family Medicine

## 2020-07-21 DIAGNOSIS — E119 Type 2 diabetes mellitus without complications: Secondary | ICD-10-CM

## 2020-07-23 ENCOUNTER — Other Ambulatory Visit: Payer: Self-pay

## 2020-07-23 ENCOUNTER — Ambulatory Visit (INDEPENDENT_AMBULATORY_CARE_PROVIDER_SITE_OTHER): Payer: Medicare Other

## 2020-07-23 DIAGNOSIS — I482 Chronic atrial fibrillation, unspecified: Secondary | ICD-10-CM | POA: Diagnosis not present

## 2020-07-23 DIAGNOSIS — Z5181 Encounter for therapeutic drug level monitoring: Secondary | ICD-10-CM

## 2020-07-23 DIAGNOSIS — Z7901 Long term (current) use of anticoagulants: Secondary | ICD-10-CM | POA: Diagnosis not present

## 2020-07-23 LAB — POCT INR: INR: 2.8 (ref 2.0–3.0)

## 2020-07-23 NOTE — Patient Instructions (Signed)
Continue on same dosage 1 tablet daily except 1/2 tablet on Tuesdays and Fridays. Repeat INR in 8 weeks.

## 2020-07-27 ENCOUNTER — Encounter: Payer: Self-pay | Admitting: Hematology and Oncology

## 2020-07-27 ENCOUNTER — Ambulatory Visit (HOSPITAL_COMMUNITY): Payer: Medicare Other | Admitting: Licensed Clinical Social Worker

## 2020-07-27 ENCOUNTER — Encounter: Payer: Self-pay | Admitting: Family Medicine

## 2020-07-27 DIAGNOSIS — Z131 Encounter for screening for diabetes mellitus: Secondary | ICD-10-CM | POA: Diagnosis not present

## 2020-07-27 DIAGNOSIS — Z113 Encounter for screening for infections with a predominantly sexual mode of transmission: Secondary | ICD-10-CM | POA: Diagnosis not present

## 2020-07-27 DIAGNOSIS — Z122 Encounter for screening for malignant neoplasm of respiratory organs: Secondary | ICD-10-CM | POA: Diagnosis not present

## 2020-07-27 DIAGNOSIS — Z1331 Encounter for screening for depression: Secondary | ICD-10-CM | POA: Diagnosis not present

## 2020-07-27 DIAGNOSIS — Z1339 Encounter for screening examination for other mental health and behavioral disorders: Secondary | ICD-10-CM | POA: Diagnosis not present

## 2020-07-27 DIAGNOSIS — R5383 Other fatigue: Secondary | ICD-10-CM | POA: Diagnosis not present

## 2020-07-27 DIAGNOSIS — R0602 Shortness of breath: Secondary | ICD-10-CM | POA: Diagnosis not present

## 2020-07-27 DIAGNOSIS — E559 Vitamin D deficiency, unspecified: Secondary | ICD-10-CM | POA: Diagnosis not present

## 2020-07-27 DIAGNOSIS — Z1159 Encounter for screening for other viral diseases: Secondary | ICD-10-CM | POA: Diagnosis not present

## 2020-07-27 DIAGNOSIS — Z0000 Encounter for general adult medical examination without abnormal findings: Secondary | ICD-10-CM | POA: Diagnosis not present

## 2020-07-27 DIAGNOSIS — Z125 Encounter for screening for malignant neoplasm of prostate: Secondary | ICD-10-CM | POA: Diagnosis not present

## 2020-07-27 DIAGNOSIS — F17210 Nicotine dependence, cigarettes, uncomplicated: Secondary | ICD-10-CM | POA: Diagnosis not present

## 2020-07-30 NOTE — Telephone Encounter (Signed)
Mailed CD.

## 2020-07-30 NOTE — Telephone Encounter (Signed)
Pt reports a request for records coming to the office, just so you are aware.   Thank you

## 2020-07-30 NOTE — Telephone Encounter (Signed)
I have completed the release; however, it's 190pgs. I am going to get Yosseline to burn and CD.

## 2020-08-04 DIAGNOSIS — F17210 Nicotine dependence, cigarettes, uncomplicated: Secondary | ICD-10-CM | POA: Diagnosis not present

## 2020-08-04 DIAGNOSIS — I10 Essential (primary) hypertension: Secondary | ICD-10-CM | POA: Diagnosis not present

## 2020-08-04 DIAGNOSIS — E119 Type 2 diabetes mellitus without complications: Secondary | ICD-10-CM | POA: Diagnosis not present

## 2020-08-04 DIAGNOSIS — E789 Disorder of lipoprotein metabolism, unspecified: Secondary | ICD-10-CM | POA: Diagnosis not present

## 2020-08-04 DIAGNOSIS — E559 Vitamin D deficiency, unspecified: Secondary | ICD-10-CM | POA: Diagnosis not present

## 2020-08-04 DIAGNOSIS — E213 Hyperparathyroidism, unspecified: Secondary | ICD-10-CM | POA: Diagnosis not present

## 2020-08-10 ENCOUNTER — Encounter (HOSPITAL_COMMUNITY): Payer: Self-pay | Admitting: Licensed Clinical Social Worker

## 2020-08-10 ENCOUNTER — Other Ambulatory Visit: Payer: Self-pay

## 2020-08-10 ENCOUNTER — Ambulatory Visit (INDEPENDENT_AMBULATORY_CARE_PROVIDER_SITE_OTHER): Payer: Medicare Other | Admitting: Licensed Clinical Social Worker

## 2020-08-10 DIAGNOSIS — F331 Major depressive disorder, recurrent, moderate: Secondary | ICD-10-CM

## 2020-08-10 DIAGNOSIS — F411 Generalized anxiety disorder: Secondary | ICD-10-CM | POA: Diagnosis not present

## 2020-08-10 NOTE — Progress Notes (Signed)
   THERAPIST PROGRESS NOTE  Session Time: 9:00am-9:55am  Participation Level: Active  Behavioral Response: Well GroomedAlertDepressed  Type of Therapy: Individual Therapy  Treatment Goals addressed:  "to cope with stress and manage depression and anxiety". [name] will report improved stress management and coping skills, resulting in mood stability and less worrying 4 out of 7 days.    Interventions: Motivational Interviewing, CBT    Summary: Rodney Friedman is a 75 y.o. male who presents with Major Depressive Disorder, moderate and Generalized Anxiety Disorder   Suicidal/Homicidal: Some thoughts without intent/plan   Therapist Response:   [name] met with clinician for individual therapy. [name] discussed his psychiatric symptoms and current life events. [name] shared that he has met and become acquainted with his now primary doctor through [facility]. Clinician utilized MI OARS to reflect and summarize his experience and noted that he felt very satisfied and is looking forward to getting the additional evaluations recommended. Clinician processed updates in grief and noted that the one year anniversary of father's death was last week. Clinician provided feedback about new therapeutic models for understanding grief, where you transition between grief thoughts and future thoughts. Clinician utilized CBT to identify the importance of taking a break from grief when needed, as well as taking the time needed for tears.    Plan: Return again in 2 weeks.   Diagnosis: Axis I: Major depressive disorder, moderate and Generalized Anxiety Disorder                          [name], LCSW 08/10/2020

## 2020-08-13 ENCOUNTER — Encounter: Payer: Self-pay | Admitting: Hematology and Oncology

## 2020-08-13 DIAGNOSIS — Z87891 Personal history of nicotine dependence: Secondary | ICD-10-CM | POA: Diagnosis not present

## 2020-08-24 ENCOUNTER — Other Ambulatory Visit: Payer: Self-pay

## 2020-08-24 ENCOUNTER — Ambulatory Visit (INDEPENDENT_AMBULATORY_CARE_PROVIDER_SITE_OTHER): Payer: Medicare Other | Admitting: Licensed Clinical Social Worker

## 2020-08-24 DIAGNOSIS — F411 Generalized anxiety disorder: Secondary | ICD-10-CM

## 2020-08-24 DIAGNOSIS — F331 Major depressive disorder, recurrent, moderate: Secondary | ICD-10-CM | POA: Diagnosis not present

## 2020-08-25 ENCOUNTER — Encounter (HOSPITAL_COMMUNITY): Payer: Self-pay | Admitting: Licensed Clinical Social Worker

## 2020-08-25 NOTE — Progress Notes (Signed)
   THERAPIST PROGRESS NOTE  Session Time: 10:00am-10:55am  Participation Level: Active  Behavioral Response: NeatAlertDepressed and Irritable  Type of Therapy: Individual Therapy  Treatment Goals addressed:  "to cope with stress and manage depression and anxiety". Brown will report improved stress management and coping skills, resulting in mood stability and less worrying 4 out of 7 days.    Interventions: Motivational Interviewing, CBT    Summary: Moses Odoherty is a 76 y.o. male who presents with Major Depressive Disorder, moderate and Generalized Anxiety Disorder   Suicidal/Homicidal: Some thoughts without intent/plan   Therapist Response:   [name] met with clinician for individual therapy. Indalecio discussed his psychiatric symptoms and current life events. Natanael shared that he continues to feel disgusted with the hard work he has to do to make sure his doctors are all informed about his myriad health concerns and symptoms. Pedram reviewed the list of his problems and the number of providers who are treating him. Clinician identified the importance of being a good advocate for himself and being able to make sure that everyone knows what everyone else is doing. 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 Clinician explored alternative options for reducing his work load. Clinician processed grief and noted that he feels "no better" about his father's death than he did 365 days ago. Clinician validated his feelings and reflected that the process of grief is not to forget the feelings, but to be able to continue living.   Plan: Return again in 2 weeks.   Diagnosis: Axis I: Major depressive disorder, moderate and Generalized Anxiety Disorder                           [name], LCSW 08/25/2020

## 2020-08-26 ENCOUNTER — Encounter: Payer: Self-pay | Admitting: Hematology and Oncology

## 2020-08-26 DIAGNOSIS — Z136 Encounter for screening for cardiovascular disorders: Secondary | ICD-10-CM | POA: Diagnosis not present

## 2020-08-26 DIAGNOSIS — F17210 Nicotine dependence, cigarettes, uncomplicated: Secondary | ICD-10-CM | POA: Diagnosis not present

## 2020-08-27 DIAGNOSIS — R942 Abnormal results of pulmonary function studies: Secondary | ICD-10-CM | POA: Diagnosis not present

## 2020-08-27 DIAGNOSIS — Z008 Encounter for other general examination: Secondary | ICD-10-CM | POA: Diagnosis not present

## 2020-08-27 DIAGNOSIS — I350 Nonrheumatic aortic (valve) stenosis: Secondary | ICD-10-CM | POA: Diagnosis not present

## 2020-08-27 DIAGNOSIS — Z6833 Body mass index (BMI) 33.0-33.9, adult: Secondary | ICD-10-CM | POA: Diagnosis not present

## 2020-08-27 DIAGNOSIS — F17210 Nicotine dependence, cigarettes, uncomplicated: Secondary | ICD-10-CM | POA: Diagnosis not present

## 2020-08-29 ENCOUNTER — Encounter: Payer: Self-pay | Admitting: Hematology and Oncology

## 2020-09-07 ENCOUNTER — Encounter (HOSPITAL_COMMUNITY): Payer: Self-pay | Admitting: Licensed Clinical Social Worker

## 2020-09-07 ENCOUNTER — Ambulatory Visit (INDEPENDENT_AMBULATORY_CARE_PROVIDER_SITE_OTHER): Payer: Medicare Other | Admitting: Licensed Clinical Social Worker

## 2020-09-07 ENCOUNTER — Other Ambulatory Visit: Payer: Self-pay

## 2020-09-07 DIAGNOSIS — F411 Generalized anxiety disorder: Secondary | ICD-10-CM

## 2020-09-07 DIAGNOSIS — F331 Major depressive disorder, recurrent, moderate: Secondary | ICD-10-CM

## 2020-09-07 NOTE — Progress Notes (Signed)
   THERAPIST PROGRESS NOTE  Session Time: 10:00am-10:55am  Participation Level: Active  Behavioral Response: Well GroomedAlertIrritable  Type of Therapy: Individual Therapy  Treatment Goals addressed:  "to cope with stress and manage depression and anxiety". Rodney Friedman will report improved stress management and coping skills, resulting in mood stability and less worrying 4 out of 7 days.    Interventions: Motivational Interviewing, CBT    Summary: Rodney Friedman is a 74 y.o. male who presents with Major Depressive Disorder, moderate and Generalized Anxiety Disorder   Suicidal/Homicidal: Some thoughts without intent/plan   Therapist Response:   Rodney Friedman met with clinician for individual therapy. Rodney Friedman discussed his psychiatric symptoms and current life events. Rodney Friedman shared that he continues to stress and worry about the state of his health, his family, and all of the world's problems. Clinician utilized MI OARS to reflect and summarize thoughts and feelings about the current and past stressors. Clinician noted the impact of stress on his physical health, as well as the impact of his heart problems on his breathing. Clinician explored the use of coping skills to redirect his thoughts and to slow things down in his mind. Clinician utilized CBT reality testing about what he can control and what he cannot control. Clinician also encouraged Rodney Friedman to reduce time in front of the news, noting that it is negatively impacting his mental health.   Plan: Return again in 2 weeks.   Diagnosis: Axis I: Major depressive disorder, moderate and Generalized Anxiety Disorder                      Jessica R Schlosberg, LCSW 09/07/2020  

## 2020-09-09 DIAGNOSIS — H2511 Age-related nuclear cataract, right eye: Secondary | ICD-10-CM | POA: Diagnosis not present

## 2020-09-14 ENCOUNTER — Telehealth (INDEPENDENT_AMBULATORY_CARE_PROVIDER_SITE_OTHER): Payer: Medicare Other | Admitting: Psychiatry

## 2020-09-14 ENCOUNTER — Other Ambulatory Visit: Payer: Self-pay

## 2020-09-14 DIAGNOSIS — F325 Major depressive disorder, single episode, in full remission: Secondary | ICD-10-CM | POA: Diagnosis not present

## 2020-09-14 DIAGNOSIS — I25709 Atherosclerosis of coronary artery bypass graft(s), unspecified, with unspecified angina pectoris: Secondary | ICD-10-CM | POA: Diagnosis not present

## 2020-09-14 MED ORDER — GABAPENTIN 100 MG PO CAPS: ORAL_CAPSULE | ORAL | 1 refills | Status: DC

## 2020-09-14 MED ORDER — BUPROPION HCL ER (SR) 100 MG PO TB12: ORAL_TABLET | ORAL | 1 refills | Status: DC

## 2020-09-14 NOTE — Progress Notes (Signed)
Psychiatric Initial Adult Assessment   Patient Identification: Rodney Friedman MRN:  [id number] Date of Evaluation:  09/14/2020 Referral Source: Dr. Nyoka Cowden Chief Complaint: Need follow-up care   Visit Diagnosis: Bipolar disorder, substance use disorder Bipolar disorder  History of Present Illness:  Today the patient is doing quite well.  He is going to have cataract surgery in the next month or so.  Eventually he is going to have aortic surgery as well.  His mood actually is pretty stable.  He continues going to [place] regularly.  Takes all his medicines as prescribed.  There is some confusion about his Wellbutrin but I think it is straightened out.  The patient denies daily depression.  He is sleeping and eating well.  He is got good energy.  He is very engaging.  He is positive and optimistic.  He is feeling some grieving from the death of his father approximately 13 months ago.  He still having a hard time getting over.  The patient continues in one-to-one therapy.  The patient drinks no alcohol and uses no drugs.  Patient shows no evidence of psychosis.  The patient is functioning very well.  He is very independent.  Substance Abuse History in the last 12 months:  Yes.    Consequences of Substance Abuse: NA  Past Medical History:  Past Medical History:  Diagnosis Date  . Adenomatous colon polyp   . Alcoholism ([place])   . Anxiety   . Atrial fibrillation ([place])   . CAD (coronary artery disease)   . Cataract    removed left eye   . 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 Depression   . Diabetes mellitus without complication ([place])   . Diverticulosis   . Eye abnormality    Macular scarring R eye  . Glaucoma   . Heart murmur   . HLD (hyperlipidemia)   . Hypertension   . Leukemia ([place])    CLL  . Myocardial infarction ([place]) 2006  . Prostate cancer ([place]) 07/08/2013   [facility] at [facility]- getting Lupron shot every 6 months - traces in prostate and 2 lymphnodes left hip-  non focused traces per pt   . Sleep apnea   . Substance abuse ([place])   . 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 Arm Surgery Right    from door accident with glass  . CATARACT EXTRACTION W/ INTRAOCULAR LENS IMPLANT Left   . COLONOSCOPY    . 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 POLYPECTOMY      Family Psychiatric History:   Family History:  Family History  Problem Relation Age of Onset  . Colon cancer Mother   . Ovarian cancer Mother   . Uterine cancer Mother   . Hypertension Father   . Prostate cancer Father   . Colon polyps Neg Hx     Social History:   Social History   Socioeconomic History  . Marital status: Divorced    Spouse name: Not on file  . Number of children: 2  . Years of education: 25  . Highest education level: Not on file  Occupational History  . Occupation: Retired Pharmacist, hospital  Tobacco Use  . Smoking status: Current Every Day Smoker    Packs/day: 0.50    Years: 50.00    Pack years: 25.00    Types: Cigarettes  . Smokeless tobacco: Never Used  Vaping Use  . Vaping Use: Never used  Substance and Sexual Activity  .  Alcohol use: No    Alcohol/week: 0.0 standard drinks  . Drug use: No  . Sexual activity: Not Currently  Other Topics Concern  . Not on file  Social History Narrative   Lives at home w/ his father   Divorced   Right-handed   Education: Secretary/administrator   No caffeine   Social Determinants of Radio broadcast assistant Strain: Not on file  Food Insecurity: Not on file  Transportation Needs: Not on file  Physical Activity: Not on file  Stress: Not on file  Social Connections: Not on file    Additional Social History:   Allergies:   Allergies  Allergen Reactions  . Other Rash    Allergen: "Plants and bushes while doing yard work"    Metabolic Disorder Labs: Lab Results  Component Value Date   HGBA1C 6.6 (H) 04/26/2020   MPG 117 02/11/2016   MPG 128 (H) 03/22/2015   No results  found for: PROLACTIN Lab Results  Component Value Date   CHOL 119 04/26/2020   TRIG 120 04/26/2020   HDL 37 (L) 04/26/2020   CHOLHDL 3.2 04/26/2020   VLDL 41 (H) 03/10/2016   LDLCALC 60 04/26/2020   LDLCALC 57 10/27/2019     Current Medications: Current Outpatient Medications  Medication Sig Dispense Refill  . amLODipine (NORVASC) 10 MG tablet Take 1 tablet (10 mg total) by mouth daily. 90 tablet 3  . B Complex Vitamins (VITAMIN-B COMPLEX PO) Take by mouth.    Marland Kitchen BIOTIN PO Take by mouth.    Marland Kitchen buPROPion (WELLBUTRIN SR) 100 MG 12 hr tablet 2  qam   1  q diner 270 tablet 1  . Calcium Carbonate-Vit D-Min (CALCIUM 1200 PO) Take 1,200 mg by mouth daily.    Mariane Baumgarten Calcium (STOOL SOFTENER PO) Take 1 tablet as needed by mouth.    . ezetimibe (ZETIA) 10 MG tablet Take 1 tablet (10 mg total) by mouth daily. 90 tablet 3  . gabapentin (NEURONTIN) 100 MG capsule 3  qhs 270 capsule 1  . ketoconazole (NIZORAL) 2 % shampoo Apply 1 application topically 2 (two) times a week. 120 mL 3  . latanoprost (XALATAN) 0.005 % ophthalmic solution Once a day every second day    . LYCOPENE PO Take 8 mg by mouth daily.    . metFORMIN (GLUCOPHAGE) 500 MG tablet Take 1 tablet (500 mg total) by mouth 2 (two) times daily with a meal. 180 tablet 2  . metoprolol (TOPROL-XL) 200 MG 24 hr tablet Take 0.5 tablets (100 mg total) by mouth 2 (two) times daily. Take with or immediately following a meal. 90 tablet 3  . Omega-3 Fatty Acids (FISH OIL) 1000 MG CAPS Take 1,000 mg by mouth daily.    . rosuvastatin (CRESTOR) 20 MG tablet Take 1 tablet (20 mg total) by mouth daily. 90 tablet 3  . valsartan (DIOVAN) 320 MG tablet Take 1 tablet (320 mg total) by mouth daily. 90 tablet 3  . vitamin B-12 (CYANOCOBALAMIN) 500 MCG tablet Take 500 mcg by mouth daily.    . vitamin C (ASCORBIC ACID) 500 MG tablet Take 1,000 mg by mouth daily.     Marland Kitchen warfarin (COUMADIN) 4 MG tablet TAKE 1/2 TO 1 TABLET DAILY AS DIRECTED BY COUMADIN CLINIC 90  tablet 1   Current Facility-Administered Medications  Medication Dose Route Frequency Provider Last Rate Last Admin  . 0.9 %  sodium chloride infusion  500 mL Intravenous Once Doran Stabler, MD  Neurologic: Headache: No Seizure: No Paresthesias:No  Musculoskeletal: Strength & Muscle Tone: within normal limits Gait & Station: normal Patient leans: Right  Psychiatric Specialty Exam: ROS  There were no vitals taken for this visit.There is no height or weight on file to calculate BMI.  General Appearance: Casual  Eye Contact:  Good  Speech:  Clear and Coherent  Volume:  Normal  Mood:  NA  Affect:  Appropriate  Thought Process:  Goal Directed  Orientation:  NA  Thought Content:  WDL  Suicidal Thoughts:  No  Homicidal Thoughts:  No  Memory:  Negative  Judgement:  Good  Insight:  Good  Psychomotor Activity:  Normal  Concentration:    Recall:  [place] of Knowledge:Good  Language: Good  Akathisia:  No  Handed:  Right  AIMS (if indicated):    Assets:  Desire for Improvement  ADL's:  Intact  Cognition: WNL  Sleep:      Treatment Plan Summary: 4/26/20221:36 PM  This patient's 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  We will continue taking Wellbutrin.  We have a 100 mg slow release to take 1 in the morning and 2 in the evening or 2 in the morning and 1 in the evening.  At any rate he needs to take 300 mg pills a day.  His second problem is that of substance use disorder specifically alcohol.  Patient has been sober for over a decade.  Patient continues to go to AA on a weekly basis.  Continue taking Neurontin as well.  Patient is doing actually pretty well.  He has 2 big surgeries coming up.  Return to see me in 2 to 3 months.  He will continue in one-to-one therapy as well.

## 2020-09-16 DIAGNOSIS — I10 Essential (primary) hypertension: Secondary | ICD-10-CM | POA: Diagnosis not present

## 2020-09-16 DIAGNOSIS — F17210 Nicotine dependence, cigarettes, uncomplicated: Secondary | ICD-10-CM | POA: Diagnosis not present

## 2020-09-16 DIAGNOSIS — Z7901 Long term (current) use of anticoagulants: Secondary | ICD-10-CM | POA: Diagnosis not present

## 2020-09-16 DIAGNOSIS — R7303 Prediabetes: Secondary | ICD-10-CM | POA: Diagnosis not present

## 2020-09-16 DIAGNOSIS — H52201 Unspecified astigmatism, right eye: Secondary | ICD-10-CM | POA: Diagnosis not present

## 2020-09-16 DIAGNOSIS — H25811 Combined forms of age-related cataract, right eye: Secondary | ICD-10-CM | POA: Diagnosis not present

## 2020-09-16 DIAGNOSIS — I2510 Atherosclerotic heart disease of native coronary artery without angina pectoris: Secondary | ICD-10-CM | POA: Diagnosis not present

## 2020-09-16 DIAGNOSIS — G4730 Sleep apnea, unspecified: Secondary | ICD-10-CM | POA: Diagnosis not present

## 2020-09-16 DIAGNOSIS — I252 Old myocardial infarction: Secondary | ICD-10-CM | POA: Diagnosis not present

## 2020-09-16 DIAGNOSIS — H2511 Age-related nuclear cataract, right eye: Secondary | ICD-10-CM | POA: Diagnosis not present

## 2020-09-17 DIAGNOSIS — H35713 Central serous chorioretinopathy, bilateral: Secondary | ICD-10-CM | POA: Diagnosis not present

## 2020-09-17 DIAGNOSIS — Z961 Presence of intraocular lens: Secondary | ICD-10-CM | POA: Diagnosis not present

## 2020-09-17 DIAGNOSIS — Z7901 Long term (current) use of anticoagulants: Secondary | ICD-10-CM | POA: Diagnosis not present

## 2020-09-17 DIAGNOSIS — H31011 Macula scars of posterior pole (postinflammatory) (post-traumatic), right eye: Secondary | ICD-10-CM | POA: Diagnosis not present

## 2020-09-17 DIAGNOSIS — Z48810 Encounter for surgical aftercare following surgery on the sense organs: Secondary | ICD-10-CM | POA: Diagnosis not present

## 2020-09-17 DIAGNOSIS — Z79899 Other long term (current) drug therapy: Secondary | ICD-10-CM | POA: Diagnosis not present

## 2020-09-17 DIAGNOSIS — H401132 Primary open-angle glaucoma, bilateral, moderate stage: Secondary | ICD-10-CM | POA: Diagnosis not present

## 2020-09-17 DIAGNOSIS — Z7952 Long term (current) use of systemic steroids: Secondary | ICD-10-CM | POA: Diagnosis not present

## 2020-09-21 ENCOUNTER — Telehealth (HOSPITAL_COMMUNITY): Payer: Self-pay

## 2020-09-21 ENCOUNTER — Other Ambulatory Visit: Payer: Self-pay

## 2020-09-21 ENCOUNTER — Telehealth (HOSPITAL_COMMUNITY): Payer: Self-pay | Admitting: *Deleted

## 2020-09-21 ENCOUNTER — Ambulatory Visit (INDEPENDENT_AMBULATORY_CARE_PROVIDER_SITE_OTHER): Payer: Medicare Other | Admitting: Licensed Clinical Social Worker

## 2020-09-21 DIAGNOSIS — F331 Major depressive disorder, recurrent, moderate: Secondary | ICD-10-CM

## 2020-09-21 DIAGNOSIS — F411 Generalized anxiety disorder: Secondary | ICD-10-CM | POA: Diagnosis not present

## 2020-09-21 NOTE — Telephone Encounter (Signed)
[facility] PRES[facility] COVERAGE APPROVED  BUBPROPION 100MG  12 HR TABLET CASE # [id number] EFFECTIVE 05/22/2020 TO 05/21/2021  S/W DWAYNE NOTIFIED PHARMACY. PT SET UP FOR TEXT NOTIFICATION SO HE WILL BE ALERTED WHEN READY TO PICK UP

## 2020-09-21 NOTE — Telephone Encounter (Signed)
Prior authorization obtained for Wellbutrin.  Dates:  05/22/20 through 05/21/21.

## 2020-09-24 ENCOUNTER — Other Ambulatory Visit: Payer: Self-pay

## 2020-09-24 ENCOUNTER — Ambulatory Visit (INDEPENDENT_AMBULATORY_CARE_PROVIDER_SITE_OTHER): Payer: Medicare Other

## 2020-09-24 ENCOUNTER — Telehealth: Payer: Self-pay

## 2020-09-24 DIAGNOSIS — Z5181 Encounter for therapeutic drug level monitoring: Secondary | ICD-10-CM | POA: Diagnosis not present

## 2020-09-24 DIAGNOSIS — Z7901 Long term (current) use of anticoagulants: Secondary | ICD-10-CM

## 2020-09-24 DIAGNOSIS — I482 Chronic atrial fibrillation, unspecified: Secondary | ICD-10-CM

## 2020-09-24 LAB — POCT INR: INR: 2.9 (ref 2.0–3.0)

## 2020-09-24 NOTE — Telephone Encounter (Signed)
When patient was checking out after coumadin clinic appointment scheduler noticed patient sob.Spoke to patient he stated he is always sob, he has COPD.Stated sob has got worse over the past 1 year.Appointment scheduled with Sande Rives PA 10/13/20 at 11:15 am.

## 2020-09-24 NOTE — Patient Instructions (Signed)
Continue on same dosage 1 tablet daily except 1/2 tablet on Tuesdays and Fridays. Repeat INR in 8 weeks.    

## 2020-09-28 DIAGNOSIS — C61 Malignant neoplasm of prostate: Secondary | ICD-10-CM | POA: Diagnosis not present

## 2020-09-30 ENCOUNTER — Encounter (HOSPITAL_COMMUNITY): Payer: Self-pay | Admitting: Licensed Clinical Social Worker

## 2020-09-30 NOTE — Progress Notes (Signed)
   THERAPIST PROGRESS NOTE  Session Time: 9:00am-9:55am  Participation Level: Active  Behavioral Response: Well GroomedAlertDepressed and Irritable  Type of Therapy: Individual Therapy  Treatment Goals addressed:  "to cope with stress and manage depression and anxiety". Linville will report improved stress management and coping skills, resulting in mood stability and less worrying 4 out of 7 days.    Interventions: Motivational Interviewing   Summary: Rodney Friedman is a 75 y.o. male who presents with Major Depressive Disorder, moderate and Generalized Anxiety Disorder   Suicidal/Homicidal: Some thoughts without intent/plan   Therapist Response:   Rodney Friedman met with clinician for individual therapy. [name] discussed his psychiatric symptoms and current life events. [name] shared that he spends most of his time worrying and stressing about his health, his bills, finances, and his family. He shared some excitement about his daughter and possibly granddaughter coming to visit him next month for his birthday and Father's Day. Clinician explored relationships and identified improvement since daughter has been sober. Clinician utilized MI OARS to reflect and summarize ongoing stress and frustration with the medical system and his role as his own advocate. Clinician validated feelings and reviewed coping skills.   Plan: Return again in 2 weeks.   Diagnosis: Axis I: Major depressive disorder, moderate and Generalized Anxiety Disorder                      [name], LCSW 09/30/2020

## 2020-10-05 ENCOUNTER — Ambulatory Visit (HOSPITAL_COMMUNITY): Payer: Medicare Other | Admitting: Licensed Clinical Social Worker

## 2020-10-05 DIAGNOSIS — C775 Secondary and unspecified malignant neoplasm of intrapelvic lymph nodes: Secondary | ICD-10-CM | POA: Diagnosis not present

## 2020-10-05 DIAGNOSIS — C61 Malignant neoplasm of prostate: Secondary | ICD-10-CM | POA: Diagnosis not present

## 2020-10-05 DIAGNOSIS — M810 Age-related osteoporosis without current pathological fracture: Secondary | ICD-10-CM | POA: Diagnosis not present

## 2020-10-09 NOTE — Progress Notes (Signed)
Cardiology Office Note:    Date:  10/13/2020   ID:  Rodney Friedman, DOB [birth year], MRN [id number]  PCP:  Kristie Cowman, MD  Cardiologist:  Sanda Klein, MD  Electrophysiologist:  None   Referring MD: Kristie Cowman, MD   Chief Complaint: shortness of breath   History of Present Illness:    Rodney Friedman is a 75 y.o. male with a history of CAD s/p remote CABG x3 in [place] in 2006 with normal perfusion on nuclear stress test in 2013, chronic atrial fibrillation on Coumadin, mild aortic stenosis, bilateral carotid stenosis, COPD, hypertension, hyperlipidemia, type 2 diabetes mellitus, sleep apnea, CLL, prostate cancer, and prior substance abuse (alcohol and drug abuse) but ongoing tobacco use who is followed by Dr. Sallyanne Kuster and presents today for evaluation of shortness of breath.   Patient moved from [place] to [place] in 2015 and has since been followed by Dr. Sallyanne Kuster. Patient was last seen by Dr. Sallyanne Kuster in 03/2020 at which time he noted some shortness of breath while mowing his lawn with a self-propelled push mower having to stop every 10-15 minutes to catch his breath but no dyspnea with other activities. Otherwise, doing well from a cardiac standpoint. He unfortunately was continuing to smoke about 4-5 cigarettes per day.    His last ischemic evaluation was a nuclear stress test in 2013 while in [place] which was normal. Most recent carotid ultrasound in 01/2016 showed stable 1-39% stenosis of bilateral ICAs. Most recent Echo in 04/2020 for routine monitoring of aortic stenosis showed LVEF of 60-65% with normal wall motion, severe LVH, grade 1 diastolic dysfunction, mild MR, and moderate AS. Recent abdominal ultrasound performed at [facility] in 08/2020 showed no evidence of AAA.   Patient presents today for evaluation of shortness of breath. Patient states earlier this month a Coumadin Clinic visit on 09/24/2020 he was having worsening breathing. He requested to see a  provider but we did not have any work-in availability that day so this visit was scheduled. He states that for 2-3 days around Coumadin Clinic visit he just felt like his breathing more rapidly. However, since then his breathing has returned to normal. He has chronic dyspnea on exertion which is stable and back to his baseline. He denies any shortness of breath at rest. No orthopnea, PND, or lower extremity edema. He states he has actually lost a couple of pounds. He is down about 6 pounds from last visit with [place] in November. No chest pain. No palpitations. He is unaware of his atrial fibrillation. No lightheadedness/dizziness or syncope. Of note, his right arm is slightly larger than the left but he states this is normal. He has had a prior injury to his right arm which is likely the cause of this.  Past Medical History:  Diagnosis Date  . Adenomatous colon polyp   . Alcoholism ([place])   . Anxiety   . Atrial fibrillation ([place])   . CAD (coronary artery disease)   . Cataract    removed left eye   . 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 Depression   . Diabetes mellitus without complication ([place])   . Diverticulosis   . Eye abnormality    Macular scarring R eye  . Glaucoma   . Heart murmur   . HLD (hyperlipidemia)   . Hypertension   . Leukemia ([place])    CLL  . Myocardial infarction ([place]) 2006  . Prostate cancer ([place]) 07/08/2013   [place] at [place]- getting Lupron shot every  6 months - traces in prostate and 2 lymphnodes left hip- non focused traces per pt   . Sleep apnea   . Substance abuse ([place])   . 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 Arm Surgery Right    from door accident with glass  . CATARACT EXTRACTION W/ INTRAOCULAR LENS IMPLANT Left   . COLONOSCOPY    . 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 POLYPECTOMY      Current Medications: Current Meds  Medication Sig  . amLODipine (NORVASC) 10 MG tablet Take 1  tablet (10 mg total) by mouth daily.  . B Complex Vitamins (VITAMIN-B COMPLEX PO) Take by mouth.  Marland Kitchen BIOTIN PO Take by mouth.  Marland Kitchen buPROPion (WELLBUTRIN SR) 100 MG 12 hr tablet 2  qam   1  q diner  . Calcium Carbonate-Vit D-Min (CALCIUM 1200 PO) Take 1,200 mg by mouth daily.  Mariane Baumgarten Calcium (STOOL SOFTENER PO) Take 1 tablet as needed by mouth.  . ezetimibe (ZETIA) 10 MG tablet Take 1 tablet (10 mg total) by mouth daily.  Marland Kitchen gabapentin (NEURONTIN) 100 MG capsule 3  qhs  . ketoconazole (NIZORAL) 2 % shampoo Apply 1 application topically 2 (two) times a week.  . latanoprost (XALATAN) 0.005 % ophthalmic solution Once a day every second day  . LYCOPENE PO Take 8 mg by mouth daily.  . metFORMIN (GLUCOPHAGE) 500 MG tablet Take 1 tablet (500 mg total) by mouth 2 (two) times daily with a meal.  . metoprolol (TOPROL-XL) 200 MG 24 hr tablet Take 0.5 tablets (100 mg total) by mouth 2 (two) times daily. Take with or immediately following a meal.  . Omega-3 Fatty Acids (FISH OIL) 1000 MG CAPS Take 1,000 mg by mouth daily.  . rosuvastatin (CRESTOR) 20 MG tablet Take 1 tablet (20 mg total) by mouth daily.  . valsartan (DIOVAN) 320 MG tablet Take 1 tablet (320 mg total) by mouth daily.  . vitamin B-12 (CYANOCOBALAMIN) 500 MCG tablet Take 500 mcg by mouth daily.  . vitamin C (ASCORBIC ACID) 500 MG tablet Take 1,000 mg by mouth daily.   Marland Kitchen warfarin (COUMADIN) 4 MG tablet TAKE 1/2 TO 1 TABLET DAILY AS DIRECTED BY COUMADIN CLINIC   Current Facility-Administered Medications for the 10/13/20 encounter (Office Visit) with Darreld Mclean, PA-C  Medication  . 0.9 %  sodium chloride infusion     Allergies:   Other   Social History   Socioeconomic History  . Marital status: Divorced    Spouse name: Not on file  . Number of children: 2  . Years of education: 17  . Highest education level: Not on file  Occupational History  . Occupation: Retired Pharmacist, hospital  Tobacco Use  . Smoking status: Current Every Day  Smoker    Packs/day: 0.50    Years: 50.00    Pack years: 25.00    Types: Cigarettes  . Smokeless tobacco: Never Used  Vaping Use  . Vaping Use: Never used  Substance and Sexual Activity  . Alcohol use: No    Alcohol/week: 0.0 standard drinks  . Drug use: No  . Sexual activity: Not Currently  Other Topics Concern  . Not on file  Social History Narrative   Lives at home w/ his father   Divorced   Right-handed   Education: Secretary/administrator   No caffeine   Social Determinants of Radio broadcast assistant Strain: Not on file  Food Insecurity: Not  on file  Transportation Needs: Not on file  Physical Activity: Not on file  Stress: Not on file  Social Connections: Not on file     Family History: The patient's family history includes Colon cancer in his mother; Hypertension in his father; Ovarian cancer in his mother; Prostate cancer in his father; Uterine cancer in his mother. There is no history of Colon polyps.  ROS:   Please see the history of present illness.     EKGs/Labs/Other Studies Reviewed:    The following studies were reviewed today:  Echocardiogram 05/03/2020: Impressions: 1. Left ventricular ejection fraction, by estimation, is 60 to 65%. The  left ventricle has normal function. The left ventricle has no regional  wall motion abnormalities. There is severe left ventricular hypertrophy.  Left ventricular diastolic parameters  are consistent with Grade I diastolic dysfunction (impaired relaxation).  2. Right ventricular systolic function is normal. The right ventricular  size is normal.  3. Left atrial size was severely dilated.  4. The mitral valve is normal in structure. Mild mitral valve  regurgitation. No evidence of mitral stenosis. Moderate mitral annular  calcification.  5. The aortic valve is tricuspid. There is moderate calcification of the  aortic valve. There is moderate thickening of the aortic valve. Aortic  valve regurgitation is mild. Moderate  aortic valve stenosis. Aortic  regurgitation PHT measures 419 msec.  Aortic valve area, by VTI measures 1.20 cm. Aortic valve mean gradient  measures 25.2 mmHg. Aortic valve Vmax measures 3.30 m/s.  6. Aortic dilatation noted. There is borderline dilatation at the level  of the sinuses of Valsalva, measuring 38 mm.  7. The inferior vena cava is normal in size with greater than 50%  respiratory variability, suggesting right atrial pressure of 3 mmHg.   Comparison(s): No significant change from prior study. Prior images  reviewed side by side. Prior 10/09/19: mean aortic valve gradient 25 mmHg,  Peak velocity 3.29 m/s.    EKG:  EKG ordered today. EKG personally reviewed and demonstrates atrial fibrillation, rate 78 bpm, with LVH and T wave inversion in leads I and AVL. Left axis deviation. Mild QRS widening of 132 ms. QTc 476 ms. No significant changes from prior tracing.   Recent Labs: 04/26/2020: ALT 46; BUN 15; Creatinine, Ser 1.08; Potassium 4.4; Sodium 144 07/20/2020: Hemoglobin 13.9; Platelets 220  Recent Lipid Panel    Component Value Date/Time   CHOL 119 04/26/2020 0941   TRIG 120 04/26/2020 0941   HDL 37 (L) 04/26/2020 0941   CHOLHDL 3.2 04/26/2020 0941   CHOLHDL 5.8 (H) 03/10/2016 0830   VLDL 41 (H) 03/10/2016 0830   LDLCALC 60 04/26/2020 0941    Physical Exam:    Vital Signs: BP 122/60   Pulse 78   Wt 216 lb 9.6 oz (98.2 kg)   SpO2 100%   BMI 31.99 kg/m     Wt Readings from Last 3 Encounters:  10/13/20 216 lb 9.6 oz (98.2 kg)  07/20/20 217 lb 9.6 oz (98.7 kg)  04/26/20 220 lb (99.8 kg)     General: 75 y.o. male in no acute distress. HEENT: Normocephalic and atraumatic. Sclera clear.  Neck: Supple. No JVD. Heart: Irregularly irregular rhythm. Distinct S1 and S2. III/VI systolic murmur. No gallops or rubs. Lungs: No increased work of breathing. Clear to ausculation bilaterally. No wheezes, rhonchi, or rales.  Abdomen: Soft, non-distended, and non-tender to  palpation.  Extremities: Trace lower extremity edema bilaterally. Right upper extremity slightly larger than left chronically (prior  injury on the right). Skin: Warm and dry. Neuro: Alert and oriented x3. No focal deficits. Psych: Normal affect. Responds appropriately.  Assessment:    1. Dyspnea on exertion   2. Chronic obstructive pulmonary disease, unspecified COPD type ([place])   3. Coronary artery disease involving native coronary artery of native heart without angina pectoris   4. Chronic atrial fibrillation (HCC)   5. Moderate aortic stenosis   6. Primary hypertension   7. Hyperlipidemia, unspecified hyperlipidemia type   8. Type 2 diabetes mellitus with complication, without long-term current use of insulin (HCC)     Plan:    Chronic Dyspnea on Exertion COPD - Patient had 2-3 days earlier this month when he felt like he was breathing more rapidly but now breathing completely back to baseline.  - Appears euvolemic on exam.  - Echo in 04/2020 showed LVEF of 60-65% with normal wall motion, severe LVH, grade 1 diastolic dysfunction, mild MR, and moderate AS. - PFTs showed normal FVC and FEV1/FVC but DLCO low.  - Offered to get a BNP but given patient's breathing is back to baseline he would to hold off on this which I think is reasonable.  CAD s/p CABG - S/p remote CABG x3 in 2006 while patient was living in [place]. Nuclear stress test in 2013 showed normal perfusion. - No angina. - No aspirin as patient is on Coumadin.  - Continue high-intensity statin and Zetia.  Chronic Atrial Fibrillation  - Rate controlled. Asymptomatic. - Continue Toprol-XL 100mg  twice daily.  - Continue chronic anticoagulation with Coumadin. Followed by our Coumadin Clinic.   Moderate Aortic Stenosis - Last Echo in 04/2020 showed moderate aortic stenosis.  - Plan is for repeat Echo in 04/2021.   Hypertension - BP well controlled. - Continue current medications: Amlodipine 10mg  daily, Valsartan  320mg  daily, Toprol-XL 100mg  twice daily.  Hyperlipidemia - Lipid panel in 04/2020: Total Cholesterol 119, Triglycerides 120, HDL 37, LDL 60.  - LDL goal <70 given CAD. - Continue Crestor 20mg  daily and Zetia 10mg  daily.   Type 2 Diabetes Mellitus  - Management per PCP.  Disposition: Follow up in 6 months with Dr. Sallyanne Kuster.   Medication Adjustments/Labs and Tests Ordered: Current medicines are reviewed at length with the patient today.  Concerns regarding medicines are outlined above.  No orders of the defined types were placed in this encounter.  No orders of the defined types were placed in this encounter.   Patient Instructions  Medication Instructions:  Your physician recommends that you continue on your current medications as directed. Please refer to the Current Medication list given to you today.  *If you need a refill on your cardiac medications before your next appointment, please call your pharmacy*  Lab Work: NONE ordered at this time of appointment   If you have labs (blood work) drawn today and your tests are completely normal, you will receive your results only by: Marland Kitchen MyChart Message (if you have MyChart) OR . A paper copy in the mail If you have any lab test that is abnormal or we need to change your treatment, we will call you to review the results.  Testing/Procedures: NONE ordered at this time of appointment   Follow-Up: At [facility], you and your health needs are our priority.  As part of our continuing mission to provide you with exceptional heart care, we have created designated Provider Care Teams.  These Care Teams include your primary Cardiologist (physician) and Advanced Practice Providers (APPs -  Physician  Assistants and Nurse Practitioners) who all work together to provide you with the care you need, when you need it.   Your next appointment:   6 month(s)  The format for your next appointment:   In Person  Provider:   Sanda Klein,  MD  Other Instructions      Signed, Darreld Mclean, PA-C  10/13/2020 12:04 PM    [place]

## 2020-10-13 ENCOUNTER — Encounter: Payer: Self-pay | Admitting: Student

## 2020-10-13 ENCOUNTER — Other Ambulatory Visit: Payer: Self-pay

## 2020-10-13 ENCOUNTER — Ambulatory Visit (INDEPENDENT_AMBULATORY_CARE_PROVIDER_SITE_OTHER): Payer: Medicare Other | Admitting: Student

## 2020-10-13 VITALS — BP 122/60 | HR 78 | Wt 216.6 lb

## 2020-10-13 DIAGNOSIS — I350 Nonrheumatic aortic (valve) stenosis: Secondary | ICD-10-CM

## 2020-10-13 DIAGNOSIS — I482 Chronic atrial fibrillation, unspecified: Secondary | ICD-10-CM

## 2020-10-13 DIAGNOSIS — J449 Chronic obstructive pulmonary disease, unspecified: Secondary | ICD-10-CM

## 2020-10-13 DIAGNOSIS — R0600 Dyspnea, unspecified: Secondary | ICD-10-CM

## 2020-10-13 DIAGNOSIS — I2510 Atherosclerotic heart disease of native coronary artery without angina pectoris: Secondary | ICD-10-CM | POA: Diagnosis not present

## 2020-10-13 DIAGNOSIS — E118 Type 2 diabetes mellitus with unspecified complications: Secondary | ICD-10-CM

## 2020-10-13 DIAGNOSIS — E785 Hyperlipidemia, unspecified: Secondary | ICD-10-CM

## 2020-10-13 DIAGNOSIS — R0609 Other forms of dyspnea: Secondary | ICD-10-CM

## 2020-10-13 DIAGNOSIS — I10 Essential (primary) hypertension: Secondary | ICD-10-CM

## 2020-10-13 NOTE — Addendum Note (Signed)
Addended by: Sande Rives on: 10/13/2020 05:18 PM   Modules accepted: Orders

## 2020-10-13 NOTE — Progress Notes (Signed)
Thanks, Callie. Could you please check for me and make sure we have him down for a 12 months follow up echo for AS (would be due in December 2022)?

## 2020-10-13 NOTE — Patient Instructions (Signed)
Medication Instructions:  Your physician recommends that you continue on your current medications as directed. Please refer to the Current Medication list given to you today.  *If you need a refill on your cardiac medications before your next appointment, please call your pharmacy*  Lab Work: NONE ordered at this time of appointment   If you have labs (blood work) drawn today and your tests are completely normal, you will receive your results only by: . MyChart Message (if you have MyChart) OR . A paper copy in the mail If you have any lab test that is abnormal or we need to change your treatment, we will call you to review the results.  Testing/Procedures: NONE ordered at this time of appointment   Follow-Up: At [facility], you and your health needs are our priority.  As part of our continuing mission to provide you with exceptional heart care, we have created designated Provider Care Teams.  These Care Teams include your primary Cardiologist (physician) and Advanced Practice Providers (APPs -  Physician Assistants and Nurse Practitioners) who all work together to provide you with the care you need, when you need it.  Your next appointment:   6 month(s)  The format for your next appointment:   In Person  Provider:   Mihai Croitoru, MD  Other Instructions  

## 2020-10-14 NOTE — Addendum Note (Signed)
Addended by: Orma Render on: 10/14/2020 03:47 PM   Modules accepted: Orders

## 2020-10-19 ENCOUNTER — Other Ambulatory Visit: Payer: Self-pay

## 2020-10-19 ENCOUNTER — Encounter (HOSPITAL_COMMUNITY): Payer: Self-pay | Admitting: Licensed Clinical Social Worker

## 2020-10-19 ENCOUNTER — Ambulatory Visit (INDEPENDENT_AMBULATORY_CARE_PROVIDER_SITE_OTHER): Payer: Medicare Other | Admitting: Licensed Clinical Social Worker

## 2020-10-19 DIAGNOSIS — F411 Generalized anxiety disorder: Secondary | ICD-10-CM

## 2020-10-19 DIAGNOSIS — F331 Major depressive disorder, recurrent, moderate: Secondary | ICD-10-CM | POA: Diagnosis not present

## 2020-10-19 DIAGNOSIS — M8589 Other specified disorders of bone density and structure, multiple sites: Secondary | ICD-10-CM | POA: Diagnosis not present

## 2020-10-19 DIAGNOSIS — C61 Malignant neoplasm of prostate: Secondary | ICD-10-CM | POA: Diagnosis not present

## 2020-10-19 DIAGNOSIS — M85852 Other specified disorders of bone density and structure, left thigh: Secondary | ICD-10-CM | POA: Diagnosis not present

## 2020-10-19 NOTE — Progress Notes (Signed)
   THERAPIST PROGRESS NOTE  Session Time: 9:00am-10:00am  Participation Level: Active  Behavioral Response: Well GroomedAlertDepressed  Type of Therapy: Individual Therapy   Treatment Goals addressed:  "to cope with stress and manage depression and anxiety". [name] will report improved stress management and coping skills, resulting in mood stability and less worrying 4 out of 7 days.    Interventions: Motivational Interviewing   Summary: Rodney Friedman is a 75 y.o. male who presents with Major Depressive Disorder, moderate and Generalized Anxiety Disorder   Suicidal/Homicidal: Some thoughts without intent/plan   Therapist Response:   [name] met with clinician for individual therapy. [name] discussed his psychiatric symptoms and current life events. [name] shared that he has a lot of concerns about new medication for his bones, which have been damaged by years of Lupron injections for his prostate. Clinician utilized MI OARS to reflect and summarize thoughts and feelings. Clinician noted the frustration he feels and reflected the importance and value of his research and checking in to his symptoms and treatment options. Clinician identified that most people do not take as much care and diligence in supplying their providers with visit summaries and notes. Clinician also identified that this is [name]'s main pastime, which is problematic, because it causes a great deal of stress and anxiety. Clinician provided options for relaxation and stress management.    Plan: Return again in 2 weeks.   Diagnosis: Axis I: Major depressive disorder, moderate and Generalized Anxiety Disorder         [name], LCSW 10/19/2020

## 2020-10-25 ENCOUNTER — Telehealth (HOSPITAL_COMMUNITY): Payer: Self-pay

## 2020-10-25 ENCOUNTER — Ambulatory Visit: Payer: Self-pay | Admitting: Family Medicine

## 2020-10-25 NOTE — Telephone Encounter (Signed)
AETNA /MEDICARE PART D SILVER SCRIPT CHOICE PRESCRIPTION COVERAGE APPROVED  BUBPROPION 100MG  12 HR TABLET EFFECTIVE 05/22/2020 TO 05/21/2021

## 2020-10-27 DIAGNOSIS — E119 Type 2 diabetes mellitus without complications: Secondary | ICD-10-CM | POA: Diagnosis not present

## 2020-10-27 DIAGNOSIS — I10 Essential (primary) hypertension: Secondary | ICD-10-CM | POA: Diagnosis not present

## 2020-10-27 DIAGNOSIS — E789 Disorder of lipoprotein metabolism, unspecified: Secondary | ICD-10-CM | POA: Diagnosis not present

## 2020-10-27 DIAGNOSIS — E559 Vitamin D deficiency, unspecified: Secondary | ICD-10-CM | POA: Diagnosis not present

## 2020-10-27 DIAGNOSIS — F17210 Nicotine dependence, cigarettes, uncomplicated: Secondary | ICD-10-CM | POA: Diagnosis not present

## 2020-11-02 ENCOUNTER — Ambulatory Visit (INDEPENDENT_AMBULATORY_CARE_PROVIDER_SITE_OTHER): Payer: Medicare Other | Admitting: Licensed Clinical Social Worker

## 2020-11-02 ENCOUNTER — Other Ambulatory Visit: Payer: Self-pay

## 2020-11-02 DIAGNOSIS — F411 Generalized anxiety disorder: Secondary | ICD-10-CM

## 2020-11-02 DIAGNOSIS — F331 Major depressive disorder, recurrent, moderate: Secondary | ICD-10-CM | POA: Diagnosis not present

## 2020-11-09 ENCOUNTER — Encounter (HOSPITAL_COMMUNITY): Payer: Self-pay | Admitting: Licensed Clinical Social Worker

## 2020-11-09 ENCOUNTER — Ambulatory Visit (HOSPITAL_COMMUNITY): Payer: Medicare Other | Admitting: Licensed Clinical Social Worker

## 2020-11-09 DIAGNOSIS — C61 Malignant neoplasm of prostate: Secondary | ICD-10-CM | POA: Diagnosis not present

## 2020-11-09 DIAGNOSIS — C775 Secondary and unspecified malignant neoplasm of intrapelvic lymph nodes: Secondary | ICD-10-CM | POA: Diagnosis not present

## 2020-11-09 NOTE — Progress Notes (Signed)
   THERAPIST PROGRESS NOTE  Session Time: 10:00am-10:55am  Participation Level: Active  Behavioral Response: Neat and Well GroomedAlertEuthymic  Type of Therapy: Individual Therapy  Treatment Goals addressed:  "to cope with stress and manage depression and anxiety". Lenis will report improved stress management and coping skills, resulting in mood stability and less worrying 4 out of 7 days.   Interventions: Motivational Interviewing   Summary: Coalton Arch is a 75 y.o. male who presents with Major Depressive Disorder, moderate and Generalized Anxiety Disorder   Suicidal/Homicidal: Some thoughts without intent/plan   Therapist Response:   [name] met with clinician for individual therapy. Buster discussed his psychiatric symptoms and current life events. Leelyn shared that he has felt peaceful this week. Orlin reports that his daughter and grandson plan to visit for his birthday/Father's Day this coming weekend and he is really looking forward to this. Clinician processed past experiences with daughter and her progress in sobriety. Clinician also utilized MI OARS to reflect long term resentment Maverick feels for daughter, but how those resentments are in the past and their relationship has improved. Clinician processed the pride he feels for his family and the hope he feels for their future.    Plan: Return again in 2 weeks.   Diagnosis: Axis I: Major depressive disorder, moderate and Generalized Anxiety Disorder          [name], LCSW 11/09/2020

## 2020-11-19 ENCOUNTER — Other Ambulatory Visit: Payer: Self-pay

## 2020-11-19 ENCOUNTER — Ambulatory Visit (INDEPENDENT_AMBULATORY_CARE_PROVIDER_SITE_OTHER): Payer: Medicare Other

## 2020-11-19 DIAGNOSIS — Z5181 Encounter for therapeutic drug level monitoring: Secondary | ICD-10-CM

## 2020-11-19 DIAGNOSIS — Z7901 Long term (current) use of anticoagulants: Secondary | ICD-10-CM

## 2020-11-19 DIAGNOSIS — I482 Chronic atrial fibrillation, unspecified: Secondary | ICD-10-CM | POA: Diagnosis not present

## 2020-11-19 LAB — POCT INR: INR: 1.6 — AB (ref 2.0–3.0)

## 2020-11-19 NOTE — Patient Instructions (Signed)
Take 1.5 tablets today only and then Continue on same dosage 1 tablet daily except 1/2 tablet on Tuesdays and Fridays. Repeat INR in 4 weeks.

## 2020-11-25 DIAGNOSIS — E789 Disorder of lipoprotein metabolism, unspecified: Secondary | ICD-10-CM | POA: Diagnosis not present

## 2020-11-25 DIAGNOSIS — Z1159 Encounter for screening for other viral diseases: Secondary | ICD-10-CM | POA: Diagnosis not present

## 2020-11-25 DIAGNOSIS — I10 Essential (primary) hypertension: Secondary | ICD-10-CM | POA: Diagnosis not present

## 2020-11-25 DIAGNOSIS — E119 Type 2 diabetes mellitus without complications: Secondary | ICD-10-CM | POA: Diagnosis not present

## 2020-11-25 DIAGNOSIS — F17210 Nicotine dependence, cigarettes, uncomplicated: Secondary | ICD-10-CM | POA: Diagnosis not present

## 2020-11-25 DIAGNOSIS — E559 Vitamin D deficiency, unspecified: Secondary | ICD-10-CM | POA: Diagnosis not present

## 2020-11-30 ENCOUNTER — Ambulatory Visit (INDEPENDENT_AMBULATORY_CARE_PROVIDER_SITE_OTHER): Payer: Medicare Other | Admitting: Licensed Clinical Social Worker

## 2020-11-30 ENCOUNTER — Other Ambulatory Visit: Payer: Self-pay

## 2020-11-30 DIAGNOSIS — F331 Major depressive disorder, recurrent, moderate: Secondary | ICD-10-CM

## 2020-11-30 DIAGNOSIS — F411 Generalized anxiety disorder: Secondary | ICD-10-CM

## 2020-12-02 ENCOUNTER — Encounter (HOSPITAL_COMMUNITY): Payer: Self-pay | Admitting: Licensed Clinical Social Worker

## 2020-12-02 NOTE — Progress Notes (Signed)
   THERAPIST PROGRESS NOTE  Session Time: 10:00am-11:00am  Participation Level: Active  Behavioral Response: NeatAlertDepressed  Type of Therapy: Individual Therapy  Treatment Goals addressed:  "to cope with stress and manage depression and anxiety". Rodney Friedman will report improved stress management and coping skills, resulting in mood stability and less worrying 4 out of 7 days.   Interventions: Motivational Interviewing   Summary: Rodney Friedman is a 75 y.o. male who presents with Major Depressive Disorder, moderate and Generalized Anxiety Disorder   Suicidal/Homicidal: Some thoughts without intent/plan   Therapist Response:   [name] met with clinician for individual therapy. [name] discussed his psychiatric symptoms and current life events. [name] shared that he felt a little mopey, but he had a wonderful visit with his daughter and grandson. Clinician explored the interactions and relationships between [name] and his daughter. [name] shared that there may be an opportunity for daughter to move to [place] in the future. Clinician processed thoughts and feelings about trust and being able to allow her into his life again in a meaningful way. Clinician utilized MI OARS to reflect the joy in establishing a relationship with grandson, and to also have granddaughter spend time with daughter and grandson. Clinician explored grief and the impact on daily life. [name] shared that he continues to miss his father and he still talks to him in the house as if he was there. "Dad, I'll be back in a few hours, going to the store" or "Dad, I'll make sure to leave the light on for when I come back home". Clinician normalized this and encouraged [name] to continue doing this for as long as he needs to. Clinician also explored options for increased socialization. Clinician reflected a deep loneliness and encouraged [name] to start making some friends through church and [place].    Plan: Return again in 2 weeks.   Diagnosis: Axis I:  Major depressive disorder, moderate and Generalized Anxiety Disorder     [name], LCSW 12/02/2020

## 2020-12-14 ENCOUNTER — Other Ambulatory Visit: Payer: Self-pay

## 2020-12-14 ENCOUNTER — Encounter (HOSPITAL_COMMUNITY): Payer: Self-pay | Admitting: Licensed Clinical Social Worker

## 2020-12-14 ENCOUNTER — Ambulatory Visit (INDEPENDENT_AMBULATORY_CARE_PROVIDER_SITE_OTHER): Payer: Medicare Other | Admitting: Licensed Clinical Social Worker

## 2020-12-14 DIAGNOSIS — F411 Generalized anxiety disorder: Secondary | ICD-10-CM

## 2020-12-14 DIAGNOSIS — F331 Major depressive disorder, recurrent, moderate: Secondary | ICD-10-CM

## 2020-12-14 NOTE — Progress Notes (Signed)
   THERAPIST PROGRESS NOTE  Session Time: 9:00am-10:00am  Participation Level: Active  Behavioral Response: Well GroomedAlertDepressed  Type of Therapy: Individual Therapy   Treatment Goals addressed:  "to cope with stress and manage depression and anxiety". [name] will report improved stress management and coping skills, resulting in mood stability and less worrying 4 out of 7 days.   Interventions: Motivational Interviewing   Summary: Rodney Friedman is a 75 y.o. male who presents with Major Depressive Disorder, moderate and Generalized Anxiety Disorder   Suicidal/Homicidal: Some thoughts without intent/plan   Therapist Response:   Rodney Friedman met with clinician for individual therapy. Rodney Friedman discussed his psychiatric symptoms and current life events. Rodney Friedman shared that he continues to feel frustrated and fearful of the world and how the current political climate is moving. Clinician engaged in MI OARS and reflected his concerns. Clinician also explored the personal impact these changes will have on him and his family, as well as the level of control he has over any of these worries he feels. Clinician discussed long hx of resentment and anger he still holds against mother. Clinician provided time and space for Rodney Friedman to discuss many experiences of his mother being "cruel" and "insensitive" to him has a child and young adult. Clinician provided psychoeducation about the core beliefs created in the early years and noted that he felt traumatized and bullied by mother. Clinician reflected the belief that this may have led to his substance abuse in late teens and adulthood.      Plan: Return again in 2 weeks.   Diagnosis: Axis I: Major depressive disorder, moderate and Generalized Anxiety Disorder    [name], LCSW 12/14/2020

## 2020-12-15 ENCOUNTER — Ambulatory Visit (INDEPENDENT_AMBULATORY_CARE_PROVIDER_SITE_OTHER): Payer: Medicare Other | Admitting: Psychiatry

## 2020-12-15 ENCOUNTER — Other Ambulatory Visit: Payer: Self-pay

## 2020-12-15 DIAGNOSIS — F325 Major depressive disorder, single episode, in full remission: Secondary | ICD-10-CM

## 2020-12-15 DIAGNOSIS — I2510 Atherosclerotic heart disease of native coronary artery without angina pectoris: Secondary | ICD-10-CM

## 2020-12-15 MED ORDER — GABAPENTIN 100 MG PO CAPS: ORAL_CAPSULE | ORAL | 1 refills | Status: DC

## 2020-12-15 MED ORDER — BUPROPION HCL ER (SR) 100 MG PO TB12: ORAL_TABLET | ORAL | 1 refills | Status: DC

## 2020-12-15 NOTE — Progress Notes (Signed)
Psychiatric Initial Adult Assessment   Patient Identification: Rodney Friedman MRN:  [id number] Date of Evaluation:  12/15/2020 Referral Source: Dr. Nyoka Cowden Chief Complaint: Need follow-up care   Visit Diagnosis: Bipolar disorder, substance use disorder Bipolar disorder  History of Present Illness:  Today the patient is doing quite well.  He has lost about 5 pounds.  He had cataract surgery as that was essentially successfully.  The patient's mood is good.  He is positive and optimistic.  He still thinks a lot about his father has been deceased for about 2 years but is getting along well.  Patient continues in one-to-one therapy.  Generally he is sleeping and eating well.  His appetite is good.  The patient enjoys television he reads a lot and stays active.  Back is starting to lift weights.  He started getting physically going to exercise.  His thoughts are clear organized and connected.  He is functioning very well.  He likes where he lives.  Financially he is doing reasonably well.  His health is stable.  There is no romance in his life at this time.  He continues going to NA and also attend AA.  He has sponsors.  The patient takes his medicines just as prescribed.  He does describe some dyspnea on exertion but no continual shortness of breath.  Coming up in the next 6 months likely will be some form of aortic surgery.  He has no neurological symptoms at this time.  He denies any chest pain.  Substance Abuse History in the last 12 months:  Yes.    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   Depression    Diabetes mellitus without complication (HCC)    Diverticulosis    Eye abnormality    Macular scarring R eye   Glaucoma     Heart murmur    HLD (hyperlipidemia)    Hypertension    Leukemia ([place])    CLL   Myocardial infarction ([place]) 2006   Prostate cancer ([place]) 07/08/2013   [facility]- getting Lupron shot every 6 months - traces in prostate and 2 lymphnodes left hip- non focused traces per pt    Sleep apnea    Substance abuse ([place])    Tremor, essential 09/22/2015    Past Surgical History:  Procedure Laterality Date   Arm Surgery Right    from door accident with glass   CATARACT EXTRACTION W/ INTRAOCULAR LENS IMPLANT Left    COLONOSCOPY     CORONARY ARTERY BYPASS GRAFT  08/04/2004   POLYPECTOMY      Family Psychiatric History:   Family History:  Family History  Problem Relation Age of Onset   Colon cancer Mother    Ovarian cancer Mother    Uterine cancer Mother    Hypertension Father    Prostate cancer Father    Colon polyps Neg Hx     Social History:   Social History   Socioeconomic History   Marital status: Divorced    Spouse name: Not on file   Number of children: 2   Years of education: 16   Highest education level: Not on file  Occupational History  Occupation: Retired Pharmacist, hospital  Tobacco Use   Smoking status: Every Day    Packs/day: 0.50    Years: 50.00    Pack years: 25.00    Types: Cigarettes   Smokeless tobacco: Never  Vaping Use   Vaping Use: Never used  Substance and Sexual Activity   Alcohol use: No    Alcohol/week: 0.0 standard drinks   Drug use: No   Sexual activity: Not Currently  Other Topics Concern   Not on file  Social History Narrative   Lives at home w/ his father   Divorced   Right-handed   Education: Secretary/administrator   No caffeine   Social Determinants of Radio broadcast assistant Strain: Not on file  Food Insecurity: Not on file  Transportation Needs: Not on file  Physical Activity: Not on file  Stress: Not on file  Social Connections: Not on file    Additional Social History:   Allergies:   Allergies  Allergen Reactions   Other Rash     Allergen: "Plants and bushes while doing yard work"    Metabolic Disorder Labs: Lab Results  Component Value Date   HGBA1C 6.6 (H) 04/26/2020   MPG 117 02/11/2016   MPG 128 (H) 03/22/2015   No results found for: PROLACTIN Lab Results  Component Value Date   CHOL 119 04/26/2020   TRIG 120 04/26/2020   HDL 37 (L) 04/26/2020   CHOLHDL 3.2 04/26/2020   VLDL 41 (H) 03/10/2016   LDLCALC 60 04/26/2020   LDLCALC 57 10/27/2019     Current Medications: Current Outpatient Medications  Medication Sig Dispense Refill   amLODipine (NORVASC) 10 MG tablet Take 1 tablet (10 mg total) by mouth daily. 90 tablet 3   B Complex Vitamins (VITAMIN-B COMPLEX PO) Take by mouth.     BIOTIN PO Take by mouth.     buPROPion ER (WELLBUTRIN SR) 100 MG 12 hr tablet 2  qam   1  q diner 270 tablet 1   Calcium Carbonate-Vit D-Min (CALCIUM 1200 PO) Take 1,200 mg by mouth daily.     Docusate Calcium (STOOL SOFTENER PO) Take 1 tablet as needed by mouth.     ezetimibe (ZETIA) 10 MG tablet Take 1 tablet (10 mg total) by mouth daily. 90 tablet 3   gabapentin (NEURONTIN) 100 MG capsule 3  qhs 270 capsule 1   ketoconazole (NIZORAL) 2 % shampoo Apply 1 application topically 2 (two) times a week. 120 mL 3   latanoprost (XALATAN) 0.005 % ophthalmic solution Once a day every second day     LYCOPENE PO Take 8 mg by mouth daily.     metFORMIN (GLUCOPHAGE) 500 MG tablet Take 1 tablet (500 mg total) by mouth 2 (two) times daily with a meal. 180 tablet 2   metoprolol (TOPROL-XL) 200 MG 24 hr tablet Take 0.5 tablets (100 mg total) by mouth 2 (two) times daily. Take with or immediately following a meal. 90 tablet 3   Omega-3 Fatty Acids (FISH OIL) 1000 MG CAPS Take 1,000 mg by mouth daily.     rosuvastatin (CRESTOR) 20 MG tablet Take 1 tablet (20 mg total) by mouth daily. 90 tablet 3   valsartan (DIOVAN) 320 MG tablet Take 1 tablet (320 mg total) by mouth daily. 90 tablet 3   vitamin B-12 (CYANOCOBALAMIN) 500 MCG tablet Take  500 mcg by mouth daily.     vitamin C (ASCORBIC ACID) 500 MG tablet Take 1,000 mg by mouth daily.  warfarin (COUMADIN) 4 MG tablet TAKE 1/2 TO 1 TABLET DAILY AS DIRECTED BY COUMADIN CLINIC 90 tablet 1   Current Facility-Administered Medications  Medication Dose Route Frequency Provider Last Rate Last Admin   0.9 %  sodium chloride infusion  500 mL Intravenous Once Danis, Kirke Corin, MD        Neurologic: Headache: No Seizure: No Paresthesias:No  Musculoskeletal: Strength & Muscle Tone: within normal limits Gait & Station: normal Patient leans: Right  Psychiatric Specialty Exam: ROS  There were no vitals taken for this visit.There is no height or weight on file to calculate BMI.  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  He will continue taking Wellbutrin slow release 1 in the morning and 2 in the afternoon.  His second problem is that of an anxiety disorder most likely an adjustment disorder with an anxious mood state.  He will continue taking Neurontin for this condition.  The most important and meaningful intervention I think they will is that he is invested in psychotherapy in our center.  He is a good therapist who feels connected to him.  Patient is doing great.  She will return to see me in 4 months.

## 2020-12-16 DIAGNOSIS — I509 Heart failure, unspecified: Secondary | ICD-10-CM

## 2020-12-16 HISTORY — DX: Heart failure, unspecified: I50.9

## 2020-12-17 ENCOUNTER — Inpatient Hospital Stay (HOSPITAL_COMMUNITY): Payer: Medicare Other

## 2020-12-17 ENCOUNTER — Inpatient Hospital Stay (HOSPITAL_COMMUNITY)
Admission: EM | Admit: 2020-12-17 | Discharge: 2020-12-20 | DRG: 291 | Disposition: A | Discharge status: Home Health | Payer: Medicare Other | Attending: Internal Medicine | Admitting: Internal Medicine

## 2020-12-17 ENCOUNTER — Encounter (HOSPITAL_COMMUNITY): Payer: Self-pay

## 2020-12-17 ENCOUNTER — Ambulatory Visit (INDEPENDENT_AMBULATORY_CARE_PROVIDER_SITE_OTHER): Payer: Medicare Other

## 2020-12-17 ENCOUNTER — Other Ambulatory Visit: Payer: Self-pay

## 2020-12-17 ENCOUNTER — Observation Stay (HOSPITAL_COMMUNITY): Payer: Medicare Other

## 2020-12-17 ENCOUNTER — Telehealth: Payer: Self-pay

## 2020-12-17 ENCOUNTER — Emergency Department (HOSPITAL_COMMUNITY): Payer: Medicare Other

## 2020-12-17 DIAGNOSIS — C61 Malignant neoplasm of prostate: Secondary | ICD-10-CM | POA: Diagnosis present

## 2020-12-17 DIAGNOSIS — I2510 Atherosclerotic heart disease of native coronary artery without angina pectoris: Secondary | ICD-10-CM | POA: Diagnosis present

## 2020-12-17 DIAGNOSIS — I482 Chronic atrial fibrillation, unspecified: Secondary | ICD-10-CM | POA: Diagnosis present

## 2020-12-17 DIAGNOSIS — R0602 Shortness of breath: Secondary | ICD-10-CM

## 2020-12-17 DIAGNOSIS — I4892 Unspecified atrial flutter: Secondary | ICD-10-CM | POA: Diagnosis present

## 2020-12-17 DIAGNOSIS — R0609 Other forms of dyspnea: Secondary | ICD-10-CM

## 2020-12-17 DIAGNOSIS — G250 Essential tremor: Secondary | ICD-10-CM | POA: Diagnosis present

## 2020-12-17 DIAGNOSIS — C9111 Chronic lymphocytic leukemia of B-cell type in remission: Secondary | ICD-10-CM | POA: Diagnosis present

## 2020-12-17 DIAGNOSIS — J9601 Acute respiratory failure with hypoxia: Secondary | ICD-10-CM

## 2020-12-17 DIAGNOSIS — R079 Chest pain, unspecified: Secondary | ICD-10-CM | POA: Diagnosis not present

## 2020-12-17 DIAGNOSIS — I517 Cardiomegaly: Secondary | ICD-10-CM | POA: Diagnosis not present

## 2020-12-17 DIAGNOSIS — I959 Hypotension, unspecified: Secondary | ICD-10-CM | POA: Diagnosis not present

## 2020-12-17 DIAGNOSIS — I252 Old myocardial infarction: Secondary | ICD-10-CM

## 2020-12-17 DIAGNOSIS — Z5181 Encounter for therapeutic drug level monitoring: Secondary | ICD-10-CM

## 2020-12-17 DIAGNOSIS — Z7901 Long term (current) use of anticoagulants: Secondary | ICD-10-CM

## 2020-12-17 DIAGNOSIS — C9110 Chronic lymphocytic leukemia of B-cell type not having achieved remission: Secondary | ICD-10-CM | POA: Diagnosis present

## 2020-12-17 DIAGNOSIS — E669 Obesity, unspecified: Secondary | ICD-10-CM | POA: Diagnosis present

## 2020-12-17 DIAGNOSIS — I480 Paroxysmal atrial fibrillation: Secondary | ICD-10-CM | POA: Diagnosis present

## 2020-12-17 DIAGNOSIS — Z66 Do not resuscitate: Secondary | ICD-10-CM | POA: Diagnosis present

## 2020-12-17 DIAGNOSIS — Z800 Family history of malignant neoplasm of digestive organs: Secondary | ICD-10-CM | POA: Diagnosis not present

## 2020-12-17 DIAGNOSIS — C775 Secondary and unspecified malignant neoplasm of intrapelvic lymph nodes: Secondary | ICD-10-CM | POA: Diagnosis present

## 2020-12-17 DIAGNOSIS — I5033 Acute on chronic diastolic (congestive) heart failure: Secondary | ICD-10-CM | POA: Diagnosis present

## 2020-12-17 DIAGNOSIS — I350 Nonrheumatic aortic (valve) stenosis: Secondary | ICD-10-CM | POA: Diagnosis present

## 2020-12-17 DIAGNOSIS — E785 Hyperlipidemia, unspecified: Secondary | ICD-10-CM | POA: Diagnosis present

## 2020-12-17 DIAGNOSIS — Z8546 Personal history of malignant neoplasm of prostate: Secondary | ICD-10-CM

## 2020-12-17 DIAGNOSIS — I25118 Atherosclerotic heart disease of native coronary artery with other forms of angina pectoris: Secondary | ICD-10-CM | POA: Diagnosis present

## 2020-12-17 DIAGNOSIS — I10 Essential (primary) hypertension: Secondary | ICD-10-CM

## 2020-12-17 DIAGNOSIS — Z20822 Contact with and (suspected) exposure to covid-19: Secondary | ICD-10-CM | POA: Diagnosis present

## 2020-12-17 DIAGNOSIS — F17210 Nicotine dependence, cigarettes, uncomplicated: Secondary | ICD-10-CM | POA: Diagnosis present

## 2020-12-17 DIAGNOSIS — I110 Hypertensive heart disease with heart failure: Principal | ICD-10-CM | POA: Diagnosis present

## 2020-12-17 DIAGNOSIS — J189 Pneumonia, unspecified organism: Secondary | ICD-10-CM | POA: Diagnosis not present

## 2020-12-17 DIAGNOSIS — Z7984 Long term (current) use of oral hypoglycemic drugs: Secondary | ICD-10-CM

## 2020-12-17 DIAGNOSIS — E119 Type 2 diabetes mellitus without complications: Secondary | ICD-10-CM | POA: Diagnosis present

## 2020-12-17 DIAGNOSIS — Z79899 Other long term (current) drug therapy: Secondary | ICD-10-CM

## 2020-12-17 DIAGNOSIS — J440 Chronic obstructive pulmonary disease with acute lower respiratory infection: Secondary | ICD-10-CM | POA: Diagnosis present

## 2020-12-17 DIAGNOSIS — Z8042 Family history of malignant neoplasm of prostate: Secondary | ICD-10-CM

## 2020-12-17 DIAGNOSIS — D6489 Other specified anemias: Secondary | ICD-10-CM | POA: Diagnosis present

## 2020-12-17 DIAGNOSIS — F39 Unspecified mood [affective] disorder: Secondary | ICD-10-CM | POA: Diagnosis present

## 2020-12-17 DIAGNOSIS — J90 Pleural effusion, not elsewhere classified: Secondary | ICD-10-CM | POA: Diagnosis not present

## 2020-12-17 DIAGNOSIS — Z6832 Body mass index (BMI) 32.0-32.9, adult: Secondary | ICD-10-CM

## 2020-12-17 DIAGNOSIS — Z8049 Family history of malignant neoplasm of other genital organs: Secondary | ICD-10-CM

## 2020-12-17 DIAGNOSIS — J984 Other disorders of lung: Secondary | ICD-10-CM | POA: Diagnosis not present

## 2020-12-17 DIAGNOSIS — Z951 Presence of aortocoronary bypass graft: Secondary | ICD-10-CM | POA: Diagnosis not present

## 2020-12-17 LAB — ECHOCARDIOGRAM COMPLETE
AR max vel: 0.67 cm2
AV Area VTI: 0.59 cm2
AV Area mean vel: 0.71 cm2
AV Mean grad: 33.5 mmHg
AV Peak grad: 57.5 mmHg
Ao pk vel: 3.79 m/s
Height: 68 in
P 1/2 time: 428 msec
S' Lateral: 2.4 cm
Weight: 3456 oz

## 2020-12-17 LAB — BLOOD GAS, ARTERIAL
Acid-base deficit: 4.7 mmol/L — ABNORMAL HIGH (ref 0.0–2.0)
Bicarbonate: 19.3 mmol/L — ABNORMAL LOW (ref 20.0–28.0)
Delivery systems: POSITIVE
Drawn by: 25770
Expiratory PAP: 6
FIO2: 70
Inspiratory PAP: 12
O2 Saturation: 99.3 %
Patient temperature: 98.6
pCO2 arterial: 33.7 mmHg (ref 32.0–48.0)
pH, Arterial: 7.376 (ref 7.350–7.450)
pO2, Arterial: 149 mmHg — ABNORMAL HIGH (ref 83.0–108.0)

## 2020-12-17 LAB — BASIC METABOLIC PANEL
Anion gap: 13 (ref 5–15)
BUN: 20 mg/dL (ref 8–23)
CO2: 18 mmol/L — ABNORMAL LOW (ref 22–32)
Calcium: 9.4 mg/dL (ref 8.9–10.3)
Chloride: 113 mmol/L — ABNORMAL HIGH (ref 98–111)
Creatinine, Ser: 0.91 mg/dL (ref 0.61–1.24)
GFR, Estimated: >60 mL/min (ref >60)
Glucose, Bld: 139 mg/dL — ABNORMAL HIGH (ref 70–99)
Potassium: 4.3 mmol/L (ref 3.5–5.1)
Sodium: 144 mmol/L (ref 135–145)

## 2020-12-17 LAB — TROPONIN I (HIGH SENSITIVITY)
Troponin I (High Sensitivity): 14 ng/L (ref <18)
Troponin I (High Sensitivity): 8 ng/L (ref <18)
Troponin I (High Sensitivity): 13 ng/L (ref <18)
Troponin I (High Sensitivity): 13 ng/L (ref <18)

## 2020-12-17 LAB — CBC
HCT: 37.4 % — ABNORMAL LOW (ref 39.0–52.0)
Hemoglobin: 12.4 g/dL — ABNORMAL LOW (ref 13.0–17.0)
MCH: 30.3 pg (ref 26.0–34.0)
MCHC: 33.2 g/dL (ref 30.0–36.0)
MCV: 91.4 fL (ref 80.0–100.0)
Platelets: 194 10*3/uL (ref 150–400)
RBC: 4.09 MIL/uL — ABNORMAL LOW (ref 4.22–5.81)
RDW: 14.2 % (ref 11.5–15.5)
WBC: 14.7 10*3/uL — ABNORMAL HIGH (ref 4.0–10.5)
nRBC: 0 % (ref 0.0–0.2)

## 2020-12-17 LAB — HEPATIC FUNCTION PANEL
ALT: 33 U/L (ref 0–44)
AST: 29 U/L (ref 15–41)
Albumin: 3.8 g/dL (ref 3.5–5.0)
Alkaline Phosphatase: 38 U/L (ref 38–126)
Bilirubin, Direct: 0.1 mg/dL (ref 0.0–0.2)
Indirect Bilirubin: 0.6 mg/dL (ref 0.3–0.9)
Total Bilirubin: 0.7 mg/dL (ref 0.3–1.2)
Total Protein: 6.7 g/dL (ref 6.5–8.1)

## 2020-12-17 LAB — PROTIME-INR
INR: 3.6 — ABNORMAL HIGH (ref 0.8–1.2)
Prothrombin Time: 36.2 seconds — ABNORMAL HIGH (ref 11.4–15.2)

## 2020-12-17 LAB — GLUCOSE, CAPILLARY
Glucose-Capillary: 104 mg/dL — ABNORMAL HIGH (ref 70–99)
Glucose-Capillary: 155 mg/dL — ABNORMAL HIGH (ref 70–99)

## 2020-12-17 LAB — LACTIC ACID, PLASMA: Lactic Acid, Venous: 1.8 mmol/L (ref 0.5–1.9)

## 2020-12-17 LAB — MRSA NEXT GEN BY PCR, NASAL: MRSA by PCR Next Gen: NOT DETECTED

## 2020-12-17 LAB — PROCALCITONIN: Procalcitonin: <0.1 ng/mL

## 2020-12-17 LAB — POCT INR: INR: 2.2 (ref 2.0–3.0)

## 2020-12-17 LAB — RESP PANEL BY RT-PCR (FLU A&B, COVID) ARPGX2
Influenza A by PCR: NEGATIVE
Influenza B by PCR: NEGATIVE
SARS Coronavirus 2 by RT PCR: NEGATIVE

## 2020-12-17 LAB — BRAIN NATRIURETIC PEPTIDE: B Natriuretic Peptide: 362.3 pg/mL — ABNORMAL HIGH (ref 0.0–100.0)

## 2020-12-17 MED ORDER — BUPROPION HCL ER (SR) 100 MG PO TB12
200.0000 mg | ORAL_TABLET | Freq: Every morning | ORAL | Status: DC
  Administered 2020-12-18 – 2020-12-20 (×3): 200 mg via ORAL
  Filled 2020-12-17 (×3): qty 2

## 2020-12-17 MED ORDER — FUROSEMIDE 10 MG/ML IJ SOLN
20.0000 mg | Freq: Once | INTRAMUSCULAR | Status: AC
  Administered 2020-12-17: 20 mg via INTRAVENOUS

## 2020-12-17 MED ORDER — BUPROPION HCL ER (SR) 100 MG PO TB12: 100.0000 mg | ORAL_TABLET | ORAL | Status: DC

## 2020-12-17 MED ORDER — INSULIN ASPART 100 UNIT/ML IJ SOLN
0.0000 [IU] | INTRAMUSCULAR | Status: DC
  Administered 2020-12-17: 2 [IU] via SUBCUTANEOUS
  Administered 2020-12-18: 1 [IU] via SUBCUTANEOUS
  Filled 2020-12-17: qty 0.09

## 2020-12-17 MED ORDER — IRBESARTAN 300 MG PO TABS
300.0000 mg | ORAL_TABLET | Freq: Every day | ORAL | Status: DC
  Administered 2020-12-18: 300 mg via ORAL
  Filled 2020-12-17 (×2): qty 1

## 2020-12-17 MED ORDER — METOPROLOL SUCCINATE ER 50 MG PO TB24: 100.0000 mg | ORAL_TABLET | Freq: Two times a day (BID) | ORAL | Status: DC

## 2020-12-17 MED ORDER — FUROSEMIDE 10 MG/ML IJ SOLN
20.0000 mg | Freq: Once | INTRAMUSCULAR | Status: AC
  Administered 2020-12-17: 20 mg via INTRAVENOUS
  Filled 2020-12-17: qty 4

## 2020-12-17 MED ORDER — ORAL CARE MOUTH RINSE
15.0000 mL | Freq: Two times a day (BID) | OROMUCOSAL | Status: DC
  Administered 2020-12-17 – 2020-12-19 (×4): 15 mL via OROMUCOSAL

## 2020-12-17 MED ORDER — ROSUVASTATIN CALCIUM 10 MG PO TABS
20.0000 mg | ORAL_TABLET | Freq: Every day | ORAL | Status: DC
  Administered 2020-12-17 – 2020-12-19 (×3): 20 mg via ORAL
  Filled 2020-12-17 (×5): qty 1

## 2020-12-17 MED ORDER — BUPROPION HCL ER (SR) 100 MG PO TB12
100.0000 mg | ORAL_TABLET | Freq: Every day | ORAL | Status: DC
  Administered 2020-12-17 – 2020-12-19 (×3): 100 mg via ORAL
  Filled 2020-12-17 (×4): qty 1

## 2020-12-17 MED ORDER — CHLORHEXIDINE GLUCONATE 0.12 % MT SOLN
15.0000 mL | Freq: Two times a day (BID) | OROMUCOSAL | Status: DC
  Administered 2020-12-17 – 2020-12-20 (×6): 15 mL via OROMUCOSAL
  Filled 2020-12-17 (×6): qty 15

## 2020-12-17 MED ORDER — ACETAMINOPHEN 325 MG PO TABS: 650.0000 mg | ORAL_TABLET | Freq: Four times a day (QID) | ORAL | Status: DC | PRN

## 2020-12-17 MED ORDER — SODIUM CHLORIDE 0.9 % IV SOLN
1.0000 g | Freq: Once | INTRAVENOUS | Status: AC
  Administered 2020-12-17: 1 g via INTRAVENOUS
  Filled 2020-12-17: qty 10

## 2020-12-17 MED ORDER — AMLODIPINE BESYLATE 10 MG PO TABS
10.0000 mg | ORAL_TABLET | Freq: Every day | ORAL | Status: DC
  Administered 2020-12-18: 10 mg via ORAL
  Filled 2020-12-17 (×2): qty 1

## 2020-12-17 MED ORDER — SODIUM CHLORIDE 0.9 % IV SOLN
1.0000 g | INTRAVENOUS | Status: DC
  Filled 2020-12-17: qty 10

## 2020-12-17 MED ORDER — CHLORHEXIDINE GLUCONATE CLOTH 2 % EX PADS
6.0000 | MEDICATED_PAD | Freq: Every day | CUTANEOUS | Status: DC
  Administered 2020-12-17 – 2020-12-18 (×2): 6 via TOPICAL

## 2020-12-17 MED ORDER — DOXYCYCLINE HYCLATE 100 MG PO TABS
100.0000 mg | ORAL_TABLET | Freq: Once | ORAL | Status: AC
  Administered 2020-12-17: 100 mg via ORAL
  Filled 2020-12-17: qty 1

## 2020-12-17 MED ORDER — GABAPENTIN 300 MG PO CAPS
300.0000 mg | ORAL_CAPSULE | Freq: Every day | ORAL | Status: DC
  Administered 2020-12-17 – 2020-12-19 (×3): 300 mg via ORAL
  Filled 2020-12-17 (×3): qty 1

## 2020-12-17 MED ORDER — LEVALBUTEROL HCL 0.63 MG/3ML IN NEBU: 0.6300 mg | INHALATION_SOLUTION | Freq: Four times a day (QID) | RESPIRATORY_TRACT | Status: DC | PRN

## 2020-12-17 MED ORDER — METOPROLOL SUCCINATE ER 25 MG PO TB24
100.0000 mg | ORAL_TABLET | Freq: Two times a day (BID) | ORAL | Status: DC
  Administered 2020-12-17 – 2020-12-18 (×2): 100 mg via ORAL
  Filled 2020-12-17 (×3): qty 4

## 2020-12-17 MED ORDER — CYANOCOBALAMIN 500 MCG PO TABS
500.0000 ug | ORAL_TABLET | ORAL | Status: DC
  Administered 2020-12-17 – 2020-12-19 (×2): 500 ug via ORAL
  Filled 2020-12-17 (×4): qty 1

## 2020-12-17 MED ORDER — LATANOPROST 0.005 % OP SOLN
1.0000 [drp] | OPHTHALMIC | Status: DC
  Administered 2020-12-17 – 2020-12-19 (×2): 1 [drp] via OPHTHALMIC
  Filled 2020-12-17: qty 2.5

## 2020-12-17 MED ORDER — ACETAMINOPHEN 650 MG RE SUPP: 650.0000 mg | Freq: Four times a day (QID) | RECTAL | Status: DC | PRN

## 2020-12-17 MED ORDER — EZETIMIBE 10 MG PO TABS
10.0000 mg | ORAL_TABLET | Freq: Every day | ORAL | Status: DC
  Administered 2020-12-18 – 2020-12-19 (×2): 10 mg via ORAL
  Filled 2020-12-17 (×4): qty 1

## 2020-12-17 MED ORDER — FUROSEMIDE 10 MG/ML IJ SOLN
INTRAMUSCULAR | Status: AC
  Administered 2020-12-17: 20 mg via INTRAVENOUS
  Filled 2020-12-17: qty 4

## 2020-12-17 MED ORDER — SODIUM CHLORIDE 0.9 % IV SOLN
100.0000 mg | Freq: Two times a day (BID) | INTRAVENOUS | Status: DC
  Administered 2020-12-17: 100 mg via INTRAVENOUS
  Filled 2020-12-17 (×2): qty 100

## 2020-12-17 MED ORDER — IOHEXOL 350 MG/ML SOLN
100.0000 mL | Freq: Once | INTRAVENOUS | Status: AC | PRN
  Administered 2020-12-17: 100 mL via INTRAVENOUS

## 2020-12-17 MED ORDER — FUROSEMIDE 10 MG/ML IJ SOLN
40.0000 mg | Freq: Three times a day (TID) | INTRAMUSCULAR | Status: DC
  Administered 2020-12-17 – 2020-12-18 (×2): 40 mg via INTRAVENOUS
  Filled 2020-12-17 (×2): qty 4

## 2020-12-17 NOTE — H&P (Addendum)
History and Physical    Rodney Friedman [id number] DOB: [birth year] DOA: 12/17/2020  PCP: Kristie Cowman, MD  Patient coming from: Home.  Chief Complaint: Shortness of breath.  HPI: Rodney Friedman is a 75 y.o. male with history of CAD status post CABG, moderate aortic stenosis with grade 1 diastolic dysfunction per 2D echo done in December 2021, COPD, ongoing tobacco abuse, A. fib, diabetes mellitus type 2 last hemoglobin A1c was 6.7 in April 26, 2020 presents to the ER with complaint of shortness of breath.  Patient states over the last 1 week patient has been getting increasingly short of breath mostly on exertion.  Has some nonproductive cough denies any chest pain.  Denies any orthopnea.  Has been compliant with his medications including Coumadin.  ED Course: In the ER patient was tachypneic with chest x-ray showing left-sided infiltrates and pleural effusion concerning for pneumonia and possible CHF.  Patient's labs show WBC count of 14.7 hemoglobin 12.4 COVID test was negative BNP was 362.  EKG showed A. fib rate controlled.  High sensitive troponins were negative.  Patient was initially started on antibiotics for pneumonia.  Patient went to the bathroom and came back when he became very short of breath and had to be placed on nonrebreather.  Repeat chest x-ray has been ordered.  Which is pending.  Original plan was to get a CT angiogram of the chest to rule out PE.  Patient has been placed on BiPAP I have ordered 40 mg of IV Lasix.  Repeat labs including lactic acid and troponin procalcitonin 2D echo INR LFTs have been ordered.  I have consulted pulmonary critical care.  Review of Systems: As per HPI, rest all negative.   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   Depression     Diabetes mellitus without complication (HCC)    Diverticulosis    Eye abnormality    Macular scarring R eye   Glaucoma    Heart murmur    HLD (hyperlipidemia)    Hypertension    Leukemia ([place])    CLL   Myocardial infarction ([place]) 2006   Prostate cancer ([place]) 07/08/2013   [facility]- getting Lupron shot every 6 months - traces in prostate and 2 lymphnodes left hip- non focused traces per pt    Sleep apnea    Substance abuse ([place])    Tremor, essential 09/22/2015    Past Surgical History:  Procedure Laterality Date   Arm Surgery Right    from door accident with glass   CATARACT EXTRACTION W/ INTRAOCULAR LENS IMPLANT Left    COLONOSCOPY     CORONARY ARTERY BYPASS GRAFT  08/04/2004   POLYPECTOMY       reports that he has been smoking cigarettes. He has a 25.00 pack-year smoking history. He has never used smokeless tobacco. He reports that he does not drink alcohol and does not use drugs.  Allergies  Allergen Reactions   Other Rash    Allergen: "Plants and bushes while doing yard work"    Family History  Problem Relation Age of Onset   Colon cancer Mother    Ovarian cancer Mother    Uterine cancer Mother    Hypertension Father    Prostate  cancer Father    Colon polyps Neg Hx     Prior to Admission medications   Medication Sig Start Date End Date Taking? Authorizing Provider  amLODipine (NORVASC) 10 MG tablet Take 1 tablet (10 mg total) by mouth daily. 04/26/20  Yes Wendie Agreste, MD  Ascorbic Acid (VITAMIN C) 1000 MG tablet Take 1,000 mg by mouth See admin instructions. Take 1 tablet  every other day alternating with '500mg'$  tablet   Yes [provider]  B Complex Vitamins (VITAMIN-B COMPLEX PO) Take 1 tablet by mouth at bedtime.   Yes [provider]  BIOTIN PO Take 1 tablet by mouth at bedtime.   Yes [provider]  buPROPion ER (WELLBUTRIN SR) 100 MG 12 hr tablet 2  qam   1  q diner Patient taking differently: Take 100-200 mg by  mouth See admin instructions. Take 2 tablets in the morning, and 1 tablet at night 12/15/20  Yes Plovsky, Berneta Sages, MD  Calcium Carbonate-Vit D-Min (CALCIUM 1200 PO) Take 1,200 mg by mouth daily.   Yes [provider]  docusate sodium (COLACE) 100 MG capsule Take 100 mg by mouth.   Yes [provider]  ezetimibe (ZETIA) 10 MG tablet Take 1 tablet (10 mg total) by mouth daily. Patient taking differently: Take 10 mg by mouth at bedtime. 03/31/20  Yes Croitoru, Mihai, MD  gabapentin (NEURONTIN) 100 MG capsule 3  qhs Patient taking differently: Take 300 mg by mouth at bedtime. 3  qhs 12/15/20  Yes Plovsky, Berneta Sages, MD  ketoconazole (NIZORAL) 2 % shampoo Apply 1 application topically 2 (two) times a week. 04/26/20  Yes Wendie Agreste, MD  latanoprost (XALATAN) 0.005 % ophthalmic solution Place 1 drop into the left eye every other day. 06/30/13  Yes [provider]  LYCOPENE PO Take 10 mg by mouth every other day.   Yes [provider]  Magnesium 250 MG TABS Take 250 mg by mouth every 3 (three) days.   Yes [provider]  metFORMIN (GLUCOPHAGE) 500 MG tablet Take 1 tablet (500 mg total) by mouth 2 (two) times daily with a meal. 04/26/20  Yes Wendie Agreste, MD  metoprolol (TOPROL-XL) 200 MG 24 hr tablet Take 0.5 tablets (100 mg total) by mouth 2 (two) times daily. Take with or immediately following a meal. 03/31/20  Yes Croitoru, Mihai, MD  Omega-3 Fatty Acids (FISH OIL) 1000 MG CAPS Take 1,000 mg by mouth every other day.   Yes [provider]  rosuvastatin (CRESTOR) 20 MG tablet Take 1 tablet (20 mg total) by mouth daily. Patient taking differently: Take 20 mg by mouth at bedtime. 03/31/20  Yes Croitoru, Mihai, MD  Sodium Fluoride (PREVIDENT 5000 DRY MOUTH DT) Place 1 application onto teeth See admin instructions. Apply to gums every night for dry mouth   Yes [provider]  valsartan (DIOVAN) 320 MG tablet Take 1 tablet (320 mg total) by  mouth daily. 03/31/20  Yes Croitoru, Mihai, MD  vitamin B-12 (CYANOCOBALAMIN) 500 MCG tablet Take 500 mcg by mouth every other day.   Yes [provider]  vitamin C (ASCORBIC ACID) 500 MG tablet Take 500 mg by mouth See admin instructions. Take 1 tablet every other day alternating with '1000mg'$    Yes [provider]  Vitamin D, Ergocalciferol, (DRISDOL) 1.25 MG (50000 UNIT) CAPS capsule Take 50,000 Units by mouth every Saturday.   Yes [provider]  warfarin (COUMADIN) 4 MG tablet TAKE 1/2 TO 1 TABLET DAILY  AS DIRECTED BY COUMADIN CLINIC Patient taking differently: Take 2-4 mg by mouth See admin instructions. Take 1/2 tablet by mouth on Tuesday and Friday. ALL other days take 1 tablet 07/05/20  Yes Croitoru, Mihai, MD    Physical Exam: Constitutional: Moderately built and nourished. Vitals:   12/17/20 0936 12/17/20 1045 12/17/20 1215 12/17/20 1345  BP:  (!) 104/45 137/82 (!) 130/47  Pulse:  (!) 45 88 89  Resp:  (!) 23 (!) 34 (!) 30  Temp:      TempSrc:      SpO2:  95% 95% 97%  Weight: 98 kg     Height: '5\' 8"'$  (1.727 m)      Eyes: Anicteric no pallor. ENMT: No discharge from the ears eyes nose and mouth. Neck: JVD elevated. Respiratory: No rhonchi or crepitations. Cardiovascular: S1-S2 heard. Abdomen: Soft nontender bowel sound present. Musculoskeletal: No edema. Skin: No rash. Neurologic: Alert awake oriented to time place and person.  Moves all extremities. Psychiatric: Appears normal.  Normal affect.   Labs on Admission: I have personally reviewed following labs and imaging studies  CBC: Recent Labs  Lab 12/17/20 0939  WBC 14.7*  HGB 12.4*  HCT 37.4*  MCV 91.4  PLT [id number]   Basic Metabolic Panel: Recent Labs  Lab 12/17/20 0939  NA 144  K 4.3  CL 113*  CO2 18*  GLUCOSE 139*  BUN 20  CREATININE 0.91  CALCIUM 9.4   GFR: Estimated Creatinine Clearance: 82 mL/min (by C-G formula based on SCr of 0.91 mg/dL). Liver Function Tests: No  results for input(s): AST, ALT, ALKPHOS, BILITOT, PROT, ALBUMIN in the last 168 hours. No results for input(s): LIPASE, AMYLASE in the last 168 hours. No results for input(s): AMMONIA in the last 168 hours. Coagulation Profile: Recent Labs  Lab 12/17/20 0811  INR 2.2   Cardiac Enzymes: No results for input(s): CKTOTAL, CKMB, CKMBINDEX, TROPONINI in the last 168 hours. BNP (last 3 results) No results for input(s): PROBNP in the last 8760 hours. HbA1C: No results for input(s): HGBA1C in the last 72 hours. CBG: No results for input(s): GLUCAP in the last 168 hours. Lipid Profile: No results for input(s): CHOL, HDL, LDLCALC, TRIG, CHOLHDL, LDLDIRECT in the last 72 hours. Thyroid Function Tests: No results for input(s): TSH, T4TOTAL, FREET4, T3FREE, THYROIDAB in the last 72 hours. Anemia Panel: No results for input(s): VITAMINB12, FOLATE, FERRITIN, TIBC, IRON, RETICCTPCT in the last 72 hours. Urine analysis:    Component Value Date/Time   APPEARANCEUR Clear 02/09/2017 1238   GLUCOSEU 3+ (A) 02/09/2017 1238   BILIRUBINUR Negative 02/09/2017 1238   PROTEINUR Negative 02/09/2017 1238   UROBILINOGEN 0.2 06/04/2014 1539   NITRITE Negative 02/09/2017 1238   LEUKOCYTESUR Negative 02/09/2017 1238   Sepsis Labs: '@LABRCNTIP'$ (procalcitonin:4,lacticidven:4) ) Recent Results (from the past 240 hour(s))  Resp Panel by RT-PCR (Flu A&B, Covid) Nasopharyngeal Swab     Status: None   Collection Time: 12/17/20 11:04 AM   Specimen: Nasopharyngeal Swab; Nasopharyngeal(NP) swabs in vial transport medium  Result Value Ref Range Status   SARS Coronavirus 2 by RT PCR NEGATIVE NEGATIVE Final    Comment: (NOTE) SARS-CoV-2 target nucleic acids are NOT DETECTED.  The SARS-CoV-2 RNA is generally detectable in upper respiratory specimens during the acute phase of infection. The lowest concentration of SARS-CoV-2 viral copies this assay can detect is 138 copies/mL. A negative result does not preclude  SARS-Cov-2 infection and should not be used as the sole basis for treatment or other patient management  decisions. A negative result may occur with  improper specimen collection/handling, submission of specimen other than nasopharyngeal swab, presence of viral mutation(s) within the areas targeted by this assay, and inadequate number of viral copies(<138 copies/mL). A negative result must be combined with clinical observations, patient history, and epidemiological information. The expected result is Negative.  Fact Sheet for Patients:  EntrepreneurPulse.com.au  Fact Sheet for Healthcare Providers:  IncredibleEmployment.be  This test is no t yet approved or cleared by the [place] FDA and  has been authorized for detection and/or diagnosis of SARS-CoV-2 by FDA under an Emergency Use Authorization (EUA). This EUA will remain  in effect (meaning this test can be used) for the duration of the COVID-19 declaration under Section 564(b)(1) of the Act, 21 U.S.C.section 360bbb-3(b)(1), unless the authorization is terminated  or revoked sooner.       Influenza A by PCR NEGATIVE NEGATIVE Final   Influenza B by PCR NEGATIVE NEGATIVE Final    Comment: (NOTE) The Xpert Xpress SARS-CoV-2/FLU/RSV plus assay is intended as an aid in the diagnosis of influenza from Nasopharyngeal swab specimens and should not be used as a sole basis for treatment. Nasal washings and aspirates are unacceptable for Xpert Xpress SARS-CoV-2/FLU/RSV testing.  Fact Sheet for Patients: EntrepreneurPulse.com.au  Fact Sheet for Healthcare Providers: IncredibleEmployment.be  This test is not yet approved or cleared by the [place] FDA and has been authorized for detection and/or diagnosis of SARS-CoV-2 by FDA under an Emergency Use Authorization (EUA). This EUA will remain in effect (meaning this test can be used) for the duration of  the COVID-19 declaration under Section 564(b)(1) of the Act, 21 U.S.C. section 360bbb-3(b)(1), unless the authorization is terminated or revoked.  Performed at [facility], [place] [address], [place], [place] [address]      Radiological Exams on Admission: DG Chest 2 View  Result Date: 12/17/2020 CLINICAL DATA:  chest pain and SOB EXAM: CHEST - 2 VIEW COMPARISON:  06/04/2014. FINDINGS: Enlarged cardiac silhouette, similar to prior. Median sternotomy. Small right pleural effusion. Possible small left pleural effusion. Patchy left basilar airspace opacities. No visible pleural effusions or pneumothorax. IMPRESSION: 1. Patchy left basilar airspace opacities, suspicious for pneumonia. 2. Cardiomegaly.  Small right (and possibly left) pleural effusions. Electronically Signed   By: Margaretha Sheffield MD   On: 12/17/2020 10:54    EKG: Independently reviewed.  A. fib with PVC rate controlled.  Assessment/Plan Principal Problem:   Acute respiratory failure with hypoxia (HCC) Active Problems:   CLL (chronic lymphocytic leukemia) (HCC)   Prostate cancer (HCC)   Coronary artery disease of native artery of native heart with stable angina pectoris (HCC)   Chronic atrial fibrillation (HCC)   Aortic valve stenosis, nonrheumatic   Paroxysmal atrial fibrillation (HCC)   CAP (community acquired pneumonia)    Acute respiratory failure with hypoxia presently on nonrebreather plan to place patient on BiPAP.  Cause not clear could be from acute CHF possible pneumonia.  Patient's last 2D echo done in December 2021 did show moderate aortic stenosis with grade 1 diastolic dysfunction.  EF was 60 to 65%.  We will try to get a CT angiogram of the chest when patient is stable.  Patient was given Lasix 40 mg IV 1 dose.  We will get a stat 2D echo trend cardiac markers check lactic acid procalcitonin continue antibiotics for now. ABG. I have consulted pulmonary critical care. History of CAD status  post CABG initially denied chest pain but later on  was complained of some chest pressure.  Will trend cardiac markers.  EKG shows chronic changes.  We will be checking stat 2D echo. Chronic A. fib on Coumadin.  If patient cannot take orally will change patient to IV heparin.  Toprol-XL for rate control. History of COPD presently not actively wheezing. Diabetes mellitus type 2 last hemoglobin A1c in December 2021 was 6.6.  On sliding scale coverage. Hypertension we will continue home medication as needed IV hydralazine. Anemia follow CBC. History of CLL in remission.  Since patient has acute respiratory failure hypoxia presently on nonrebreather trying to place patient on BiPAP.  Patient will need inpatient status.   DVT prophylaxis: Coumadin. Code Status: DNR Family Communication: We will discuss with family. Disposition Plan: Home when stable Consults called: Pulmonary critical care. Admission status: Inpatient.   Rise Patience MD [facility] Pager [phone].  If 7PM-7AM, please contact night-coverage [email or web address] Password [email or web address]  12/17/2020, 1:50 PM

## 2020-12-17 NOTE — Consult Note (Signed)
NAME:  Rodney Friedman, MRN:  [id number], DOB:  August 30, 1945, LOS: 0 ADMISSION DATE:  12/17/2020, CONSULTATION DATE:  12/17/20 REFERRING MD:  Hal Hope, CHIEF COMPLAINT:  chest pain and shortness of breath   History of Present Illness:  75 year old male with past medical history of chronic atrial fibrillation, aortic stenosis, CAD status post CABG in 2006 with unremarkable nuclear stress test 2013, mild aortic stenosis, COPD, CLL and prostate cancer both in remission, hypertension, hyperlipidemia, type 2 diabetes, OSA who presented to the emergency department today after going to his outpatient INR check and he mentioned that he has been experiencing intermittent chest pain and shortness of breath typically with exertion so was referred to the ED.  On arrival his vital signs were stable, EKG without signs of STEMI.  He was placed on BiPAP and PCCM consulted   Further work-up significant for get of high-sensitivity troponin x2, BNP 362, negative COVID-19, white blood cell count of 14 K (chronically elevated).  His last echo was 6 months ago which showed stable AAS, grade 1 diastolic dysfunction with an EF of 60 to 65% and severe left atrial dilation.   CT chest PE protocol pending, CXR with patchy basilar opacities.  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    Significant Hospital Events: Including procedures, antibiotic start and stop dates in addition to  other pertinent events   7/29 presented to the ED with intermittent chest pain, trop negative and no acute ischemia, required Bipap, PCCM consult    Interim History / Subjective:  Hemodynamically stable, comfortable on BiPAP    Objective   Blood pressure (!) 130/47, pulse 89, temperature (!) 97.5 F (36.4 C), temperature source Oral, resp. rate (!) 30, height '5\' 8"'$  (1.727 m), weight 98 kg, SpO2 97 %.       No intake or output data in the 24 hours ending 12/17/20 1609 Filed Weights   12/17/20 0936  Weight: 98 kg   General: Elderly male, well-nourished, moderately dyspneic, tolerating BiPAP mask HEENT: MM pink/moist Neuro: Awake and alert, oriented x3, no focal deficits CV: s1s2 RRR, no m/r/g PULM: Decreased air movement bilateral bases without significant rhonchi or wheezing, tachypneic without retractions and able to speak in full sentences GI: soft, bsx4 active Extremities: warm/dry, no edema Skin: no rashes or lesions    Resolved Hospital Problem list     Assessment & Plan:   Acute respiratory distress and chest pain In the setting of baseline CAD, AS and COPD, HFpEF, atrial fibrillation, hypertension, hyperlipidemia Intermittent for several months, though worse today Follows with Cardiologist Dr. Keene Breath POA CT chest without PE, notable for patchy airspace opacities in both lower lobes, possible pneumonia and less likely dependent pulmonary edema P: -Suspect his symptoms are likely secondary to a combination of developing CAP with angina -Echocardiogram pending, trend Troponin, may benefit from as needed nitroglycerin and stress test when stable -Weight stable from last cardiology office appointment and denies any lower extremity edema or orthopnea, BNP 300, doubt significant pulmonary edema -ABG results 7.37, PCO2 33, PO2 149, with transition to high flow nasal cannula  from BiPAP as distress will allow -check RVP, urine strep and legionella -Incentive  Spirometer -agree with ceftriaxone and doxycycline -Pt not on home inhalers for COPD or Bipap for OSA, exam not consistent with COPD exacerbation.  Would benefit from qhs Bipap while inpatient   Thank you for this consult, we will continue to follow with you  Best Practice (right click and "Reselect all SmartList Selections" daily)   Per Primary  Labs   CBC: Recent Labs  Lab 12/17/20 0939  WBC 14.7*  HGB 12.4*  HCT 37.4*  MCV 91.4  PLT [id number]    Basic Metabolic Panel: Recent Labs  Lab 12/17/20 0939  NA 144  K 4.3  CL 113*  CO2 18*  GLUCOSE 139*  BUN 20  CREATININE 0.91  CALCIUM 9.4   GFR: Estimated Creatinine Clearance: 82 mL/min (by C-G formula based on SCr of 0.91 mg/dL). Recent Labs  Lab 12/17/20 0939 12/17/20 1315  PROCALCITON  --  <0.10  WBC 14.7*  --     Liver Function Tests: Recent Labs  Lab 12/17/20 1315  AST 29  ALT 33  ALKPHOS 38  BILITOT 0.7  PROT 6.7  ALBUMIN 3.8   No results for input(s): LIPASE, AMYLASE in the last 168 hours. No results for input(s): AMMONIA in the last 168 hours.  ABG    Component Value Date/Time   PHART 7.376 12/17/2020 1420   PCO2ART 33.7 12/17/2020 1420   PO2ART 149 (H) 12/17/2020 1420   HCO3 19.3 (L) 12/17/2020 1420   ACIDBASEDEF 4.7 (H) 12/17/2020 1420   O2SAT 99.3 12/17/2020 1420     Coagulation Profile: Recent Labs  Lab 12/17/20 0811  INR 2.2    Cardiac Enzymes: No results for input(s): CKTOTAL, CKMB, CKMBINDEX, TROPONINI in the last 168 hours.  HbA1C: Hgb A1c MFr Bld  Date/Time Value Ref Range Status  04/26/2020 09:41 AM 6.6 (H) 4.8 - 5.6 % Final    Comment:             Prediabetes: 5.7 - 6.4          Diabetes: >6.4          Glycemic control for adults with diabetes: <7.0   10/27/2019 08:45 AM 6.2 (H) 4.8 - 5.6 % Final    Comment:             Prediabetes: 5.7 - 6.4          Diabetes: >6.4          Glycemic control for adults with diabetes: <7.0     CBG: No results for input(s):  GLUCAP in the last 168 hours.  Review of Systems:   Review of Systems Constitutional:  Negative for chills, fever, malaise/fatigue and weight loss. Respiratory:  Positive for shortness of breath. Negative for cough and wheezing.   Cardiovascular:  Positive for chest pain and palpitations. Negative for orthopnea, claudication and leg swelling.     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   Surgical History:   Past Surgical History:  Procedure Laterality Date   Arm Surgery Right    from door accident with glass   CATARACT EXTRACTION W/  INTRAOCULAR LENS IMPLANT Left    COLONOSCOPY     CORONARY ARTERY BYPASS GRAFT  08/04/2004   POLYPECTOMY       Social History:   reports that he has been smoking cigarettes. He has a 25.00 pack-year smoking history. He has never used smokeless tobacco. He reports that he does not drink alcohol and does not use drugs.   Family History:  His family history includes Colon cancer in his mother; Hypertension in his father; Ovarian cancer in his mother; Prostate cancer in his father; Uterine cancer in his mother. There is no history of Colon polyps.   Allergies Allergies  Allergen Reactions   Other Rash    Allergen: "Plants and bushes while doing yard work"     Home Medications  Prior to Admission medications   Medication Sig Start Date End Date Taking? Authorizing Provider  amLODipine (NORVASC) 10 MG tablet Take 1 tablet (10 mg total) by mouth daily. 04/26/20  Yes  Wendie Agreste, MD  Ascorbic Acid (VITAMIN C) 1000 MG tablet Take 1,000 mg by mouth See admin instructions. Take 1 tablet  every other day alternating with '500mg'$  tablet   Yes [provider]  B Complex Vitamins (VITAMIN-B COMPLEX PO) Take 1 tablet by mouth at bedtime.   Yes [provider]  BIOTIN PO Take 1 tablet by mouth at bedtime.   Yes [provider]  buPROPion ER (WELLBUTRIN SR) 100 MG 12 hr tablet 2  qam   1  q diner Patient taking differently: Take 100-200 mg by mouth See admin instructions. Take 2 tablets in the morning, and 1 tablet at night 12/15/20  Yes Plovsky, Berneta Sages, MD  Calcium Carbonate-Vit D-Min (CALCIUM 1200 PO) Take 1,200 mg by mouth daily.   Yes [provider]  docusate sodium (COLACE) 100 MG capsule Take 100 mg by mouth.   Yes [provider]  ezetimibe (ZETIA) 10 MG tablet Take 1 tablet (10 mg total) by mouth daily. Patient taking differently: Take 10 mg by mouth at bedtime. 03/31/20  Yes Croitoru, Mihai, MD  gabapentin (NEURONTIN) 100 MG capsule 3  qhs Patient taking differently: Take 300 mg by mouth at bedtime. 3  qhs 12/15/20  Yes Plovsky, Berneta Sages, MD  ketoconazole (NIZORAL) 2 % shampoo Apply 1 application topically 2 (two) times a week. 04/26/20  Yes Wendie Agreste, MD  latanoprost (XALATAN) 0.005 % ophthalmic solution Place 1 drop into the left eye every other day. 06/30/13  Yes [provider]  LYCOPENE PO Take 10 mg by mouth every other day.   Yes [provider]  Magnesium 250 MG TABS Take 250 mg by mouth every 3 (three) days.   Yes [provider]  metFORMIN (GLUCOPHAGE) 500 MG tablet Take 1 tablet (500 mg total) by mouth 2 (two) times daily with a meal. 04/26/20  Yes Wendie Agreste, MD  metoprolol (TOPROL-XL) 200 MG 24 hr tablet Take 0.5 tablets (100 mg total) by mouth 2 (two) times daily. Take with or immediately following a meal. 03/31/20  Yes Croitoru, Mihai, MD  Omega-3 Fatty Acids (FISH  OIL) 1000 MG CAPS Take 1,000 mg by mouth every other day.   Yes [provider]  rosuvastatin (CRESTOR) 20 MG tablet Take 1 tablet (20 mg total) by mouth daily. Patient taking differently: Take 20 mg by mouth at bedtime. 03/31/20  Yes Croitoru, Mihai, MD  Sodium Fluoride (PREVIDENT 5000 DRY MOUTH DT) Place 1 application onto teeth See admin instructions. Apply to gums  every night for dry mouth   Yes [provider]  valsartan (DIOVAN) 320 MG tablet Take 1 tablet (320 mg total) by mouth daily. 03/31/20  Yes Croitoru, Mihai, MD  vitamin B-12 (CYANOCOBALAMIN) 500 MCG tablet Take 500 mcg by mouth every other day.   Yes [provider]  vitamin C (ASCORBIC ACID) 500 MG tablet Take 500 mg by mouth See admin instructions. Take 1 tablet every other day alternating with '1000mg'$    Yes [provider]  Vitamin D, Ergocalciferol, (DRISDOL) 1.25 MG (50000 UNIT) CAPS capsule Take 50,000 Units by mouth every Saturday.   Yes [provider]  warfarin (COUMADIN) 4 MG tablet TAKE 1/2 TO 1 TABLET DAILY AS DIRECTED BY COUMADIN CLINIC Patient taking differently: Take 2-4 mg by mouth See admin instructions. Take 1/2 tablet by mouth on Tuesday and Friday. ALL other days take 1 tablet 07/05/20  Yes Croitoru, Dani Gobble, MD     Otilio Carpen Keeshawn Fakhouri, PA-C [facility] See Amion for pager If no response to pager , please call [phone] until 7pm After 7:00 pm call Elink  [phone]?[place]

## 2020-12-17 NOTE — Patient Instructions (Signed)
Continue on same dosage 1 tablet daily except 1/2 tablet on Tuesdays and Fridays. Repeat INR in 6 weeks.

## 2020-12-17 NOTE — ED Triage Notes (Signed)
Patient states he has been having constant chest pain and SOB x 8 days. Patient states he was at the Cardiologist today for blood work and was instructed to come to the ED.

## 2020-12-17 NOTE — Progress Notes (Signed)
Assisted with transporting PT to [facility] CT while on BiPAP- uneventful. PT back in room [facility] 08.

## 2020-12-17 NOTE — ED Provider Notes (Signed)
[facility] DEPT Provider Note   CSN: [id number] Arrival date & time: 12/17/20  [id number]     History Chief Complaint  Patient presents with   Chest Pain   Shortness of Breath    Rodney Friedman is a 75 y.o. male  a history of CAD s/p remote CABG x3 in [place] in 2006 with normal perfusion on nuclear stress test in 2013, chronic atrial fibrillation on Coumadin, mild aortic stenosis, bilateral carotid stenosis, COPD, hypertension, hyperlipidemia, type 2 diabetes mellitus, sleep apnea, CLL, prostate cancer, smoking, presented ED with chest pain.  Patient reports that he was at the cardiology office today having a Coumadin check.  He was complaining of chest pain or shortness of breath that he said it been ongoing for approximately 1 week.  He was told to come in to emergency department for evaluation.  Patient reports to me that he has chronic chest discomfort and shortness of breath.  He says typically when he works in the yard he can work "maybe 5 or 6 minutes and then a rest for 5 or 6 minutes".  He says that this week he feels like his had more shortness of breath and chest discomfort.  He is having very hard time quantifying or qualifying his discomfort for me.    He continues smoking 4 to 5 cigarettes/day.  He does not wear oxygen at home.  He does not use breathing treatments or nebulizers.  His cardiologist is Dr Recardo Evangelist  INR level today was 2.2 in the office.  Lste echo was 05/03/2020 showing EF 60-65%, severe LVH, grade I diastolic dysfunction, moderate aortic valve stenosis  HPI     Past Medical History:  Diagnosis Date   Adenomatous colon polyp    Alcoholism ([place])    Anxiety    Atrial fibrillation ([facility])    CAD (coronary artery disease)    Cataract    removed left eye    CLL (chronic lymphocytic leukemia) ([place]) 07/08/2013   Depression    Diabetes mellitus without complication ([facility])    Diverticulosis    Eye abnormality    Macular scarring  R eye   Glaucoma    Heart murmur    HLD (hyperlipidemia)    Hypertension    Leukemia ([place])    CLL   Myocardial infarction ([place]) 2006   Prostate cancer ([place]) 07/08/2013   Otellin at [facility]- getting Lupron shot every 6 months - traces in prostate and 2 lymphnodes left hip- non focused traces per pt    Sleep apnea    Substance abuse ([place])    Tremor, essential 09/22/2015    Patient Active Problem List   Diagnosis Date Noted   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   Chronic pain 03/14/2019   Other social stressor 07/27/2018   Essential hypertension 07/27/2018   Diabetes mellitus type 2 in obese ([place]) 07/27/2018   Paroxysmal atrial fibrillation ([place]) 04/21/2016   Diabetes mellitus ([place]) 04/21/2016   OSA (obstructive sleep apnea) 04/21/2016   Quality of life palliative care encounter 01/03/2016   Bilateral carotid bruits without stenosis 01/02/2016   Tremor, essential 09/22/2015   Glaucoma, open angle 06/16/2015   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   Pseudoaphakia 06/15/2015   CD (contact dermatitis) 04/29/2014   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   Hematuria 01/05/2014   Skin lesion 01/05/2014   Aortic valve stenosis, nonrheumatic 01/03/2014   Hypertensive heart disease 01/03/2014   Mixed hyperlipidemia 01/03/2014   Mild obesity 01/03/2014  Tobacco abuse 01/03/2014   Chronic atrial fibrillation ([place]) 12/31/2013   Long term (current) use of anticoagulants 12/31/2013   Cataract, nuclear 10/06/2013   Dermatochalasis of eyelid 10/06/2013   Chorioretinal scar, 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   Coronary artery disease of native artery of native heart with stable angina pectoris ([place]) 09/03/2013   Pre-diabetes 09/03/2013   CLL (chronic lymphocytic leukemia) ([place]) 07/08/2013   Prostate cancer ([place]) 07/08/2013    Past Surgical History:  Procedure Laterality Date   Arm Surgery Right    from door accident with glass   CATARACT EXTRACTION W/  INTRAOCULAR LENS IMPLANT Left    COLONOSCOPY     CORONARY ARTERY BYPASS GRAFT  08/04/2004   POLYPECTOMY         Family History  Problem Relation Age of Onset   Colon cancer Mother    Ovarian cancer Mother    Uterine cancer Mother    Hypertension Father    Prostate cancer Father    Colon polyps Neg Hx     Social History   Tobacco Use   Smoking status: Every Day    Packs/day: 0.50    Years: 50.00    Pack years: 25.00    Types: Cigarettes   Smokeless tobacco: Never  Vaping Use   Vaping Use: Never used  Substance Use Topics   Alcohol use: No    Alcohol/week: 0.0 standard drinks   Drug use: No    Home Medications Prior to Admission medications   Medication Sig Start Date End Date Taking? Authorizing Provider  amLODipine (NORVASC) 10 MG tablet Take 1 tablet (10 mg total) by mouth daily. 04/26/20   Wendie Agreste, MD  B Complex Vitamins (VITAMIN-B COMPLEX PO) Take by mouth.    [provider]  BIOTIN PO Take by mouth.    [provider]  buPROPion ER [facility] SR) 100 MG 12 hr tablet 2  qam   1  q diner 12/15/20   Plovsky, Berneta Sages, MD  Calcium Carbonate-Vit D-Min (CALCIUM 1200 PO) Take 1,200 mg by mouth daily.    [provider]  Docusate Calcium (STOOL SOFTENER PO) Take 1 tablet as needed by mouth.    [provider]  ezetimibe (ZETIA) 10 MG tablet Take 1 tablet (10 mg total) by mouth daily. 03/31/20   Croitoru, Mihai, MD  gabapentin (NEURONTIN) 100 MG capsule 3  qhs 12/15/20   Plovsky, Berneta Sages, MD  ketoconazole (NIZORAL) 2 % shampoo Apply 1 application topically 2 (two) times a week. 04/26/20   Wendie Agreste, MD  latanoprost (XALATAN) 0.005 % ophthalmic solution Once a day every second day 06/30/13   [provider]  LYCOPENE PO Take 8 mg by mouth daily.    [provider]  metFORMIN (GLUCOPHAGE) 500 MG tablet Take 1 tablet (500 mg total) by mouth 2 (two) times daily with a meal. 04/26/20   Wendie Agreste, MD   metoprolol (TOPROL-XL) 200 MG 24 hr tablet Take 0.5 tablets (100 mg total) by mouth 2 (two) times daily. Take with or immediately following a meal. 03/31/20   Croitoru, Mihai, MD  Omega-3 Fatty Acids (FISH OIL) 1000 MG CAPS Take 1,000 mg by mouth daily.    [provider]  rosuvastatin (CRESTOR) 20 MG tablet Take 1 tablet (20 mg total) by mouth daily. 03/31/20   Croitoru, Mihai, MD  valsartan (DIOVAN) 320 MG tablet Take 1 tablet (320 mg total) by mouth daily. 03/31/20   Croitoru, Mihai, MD  vitamin B-12 (CYANOCOBALAMIN) 500 MCG  tablet Take 500 mcg by mouth daily.    [provider]  vitamin C (ASCORBIC ACID) 500 MG tablet Take 1,000 mg by mouth daily.     [provider]  warfarin (COUMADIN) 4 MG tablet TAKE 1/2 TO 1 TABLET DAILY AS DIRECTED BY COUMADIN CLINIC 07/05/20   Croitoru, Dani Gobble, MD    Allergies    Other  Review of Systems   Review of Systems  Constitutional:  Negative for chills and fever.  Eyes:  Negative for pain and visual disturbance.  Respiratory:  Positive for cough and shortness of breath.   Cardiovascular:  Positive for chest pain. Negative for palpitations.  Gastrointestinal:  Negative for abdominal pain and vomiting.  Genitourinary:  Negative for dysuria and hematuria.  Musculoskeletal:  Negative for arthralgias and back pain.  Skin:  Negative for color change and rash.  Neurological:  Negative for seizures, syncope, light-headedness and headaches.  All other systems reviewed and are negative.  Physical Exam Updated Vital Signs BP (!) 121/49 (BP Location: Left Arm)   Pulse 99   Temp (!) 97.5 F (36.4 C) (Oral)   Resp 16   Ht '5\' 8"'$  (1.727 m)   Wt 98 kg   SpO2 99%   BMI 32.84 kg/m   Physical Exam Constitutional:      General: He is not in acute distress. HENT:     Head: Normocephalic and atraumatic.  Eyes:     Conjunctiva/sclera: Conjunctivae normal.     Pupils: Pupils are equal, round, and reactive to light.  Cardiovascular:      Rate and Rhythm: Normal rate. Rhythm irregular.     Heart sounds: Normal heart sounds.  Pulmonary:     Effort: Pulmonary effort is normal. No respiratory distress.     Comments: 94% on room air Abdominal:     General: There is no distension.     Tenderness: There is no abdominal tenderness.  Skin:    General: Skin is warm and dry.  Neurological:     General: No focal deficit present.     Mental Status: He is alert and oriented to person, place, and time. Mental status is at baseline.    ED Results / Procedures / Treatments   Labs (all labs ordered are listed, but only abnormal results are displayed) Labs Reviewed  BASIC METABOLIC PANEL  CBC  TROPONIN I (HIGH SENSITIVITY)    EKG None  Radiology No results found.  Procedures .Critical Care  Date/Time: 12/17/2020 5:12 PM Performed by: Wyvonnia Dusky, MD Authorized by: Wyvonnia Dusky, MD   Critical care provider statement:    Critical care time (minutes):  35   Critical care was necessary to treat or prevent imminent or life-threatening deterioration of the following conditions:  Respiratory failure   Critical care was time spent personally by me on the following activities:  Discussions with consultants, evaluation of patient's response to treatment, examination of patient, ordering and performing treatments and interventions, ordering and review of laboratory studies, ordering and review of radiographic studies, pulse oximetry, re-evaluation of patient's condition, obtaining history from patient or surrogate and review of old charts   Medications Ordered in ED Medications - No data to display  ED Course  I have reviewed the triage vital signs and the nursing notes.  Pertinent labs & imaging results that were available during my care of the patient were reviewed by me and considered in my medical decision making (see chart for details).  This patient presents to  the Emergency Department with complaint of  dyspnea.  This involves an extensive number of treatment options, and is a complaint that carries with it a high risk of complications and morbidity.  The differential diagnosis includes ACS vs Pneumothorax vs Reflux/Gastritis vs MSK pain vs Pneumonia vs other.  I felt PE was less likely given that the symptoms have been chronic, he has no hypoxia.  I ordered, reviewed, and interpreted labs, showing WBC 14.7, hgb stable at 12.4, BMP largely unremarkable, BNP elevated at 362, Trop 14 -> 8. I ordered imaging studies which included dg chest I independently visualized and interpreted imaging which showed cardiomegaly and possible left sided PNA Previous records obtained and reviewed showing recent echo I personally reviewed the patients ECG which showed A. fib which is rate controlled with occasional PVCs (appears to be a chronic rhythm).  No evidence of acute ischemia at this time.  He appears to have some mild vascular congestion consistent with CHF and possible left sided PNA.  He is tachypneic but speaking comfortable in full sentences and not requiring oxygen.  Given his medical comorbdities, the patient was admitted for treatment of PNA and possible CHF.  *  Following admission, the patient developed worsening respiratory distress and tachypnea.  He was started on bipap with relief of his symptoms.  He was given IV diuresis and is undergoing CT PE study.  I reassessed him and felt he was stable on bipap, not requiring intubation at this time.  Primary care has been transferred to hospitalist service.   Clinical Course as of 12/17/20 1709  Fri Dec 17, 2020  1057 IMPRESSION: 1. Patchy left basilar airspace opacities, suspicious for pneumonia. 2. Cardiomegaly.  Small right (and possibly left) pleural effusions. [MT]  1108 Patient reassessed at this time.  He does remain tachypneic with respiratory rate in the high 20s, feels subjectively that he is having difficult time breathing.  His oxygen  saturation is around 91%.  This may be chronic due to his smoking and COPD, but is unclear at this time.  Given his comorbidities, CURB65 score of 2, I feel is reasonable to observe him in the hospital overnight on IV antibiotics.  We will provide 2 L supplemental oxygen for subjective dyspnea. [MT]  1125 Admitted to hospitalist. [MT]    Clinical Course User Index [MT] Cozy Veale, Carola Rhine, MD    Final Clinical Impression(s) / ED Diagnoses Final diagnoses:  None    Rx / DC Orders ED Discharge Orders     None        Langston Masker Carola Rhine, MD 12/17/20 [id number]

## 2020-12-17 NOTE — Progress Notes (Signed)
Notified Lab that ABG being sent for analysis. 

## 2020-12-17 NOTE — Progress Notes (Signed)
Pt placed on BIPAP he is tolerating it well at this time. He was on 2L [place] and no resp distress, Pt has order for [facility] during days but it is not needed at this time.

## 2020-12-17 NOTE — H&P (Deleted)
  Duplicate H&P entered, see consult note

## 2020-12-17 NOTE — Progress Notes (Signed)
[name] for warfarin Indication: hx atrial fibrillation  Allergies  Allergen Reactions   Other Rash    Allergen: "Plants and bushes while doing yard work"    Patient Measurements: Height: '5\' 8"'$  (172.7 cm) Weight: 98 kg (216 lb) IBW/kg (Calculated) : 68.4 Heparin Dosing Weight:   Vital Signs: Temp: 97.5 F (36.4 C) (07/29 0932) Temp Source: Oral (07/29 0932) BP: 130/47 (07/29 1345) Pulse Rate: 89 (07/29 1345)  Labs: Recent Labs    12/17/20 0811 12/17/20 0939 12/17/20 1139 12/17/20 1315  HGB  --  12.4*  --   --   HCT  --  37.4*  --   --   PLT  --  194  --   --   INR 2.2  --   --   --   CREATININE  --  0.91  --   --   TROPONINIHS  --  '14 8 13    '$ Estimated Creatinine Clearance: 82 mL/min (by C-G formula based on SCr of 0.91 mg/dL).   Medications:  PTA warfarin regimen: '4mg'$  daily except '2mg'$  on Tue and Fri per [facility] clinic's note on 7/29  Assessment: Patient is a 75 y.o M with hx CAD (s/p CABG) and chronic afib on warfarin PTA presented to the ED on 7/29 with c/o CP.   Today, 12/17/2020: - INR is therapeutic at 2.2 (from [facility] clinic) - troponin 8 - cbc ok  Goal of Therapy:  INR 2-3 Monitor platelets by anticoagulation protocol: Yes   Plan:  - Per med rec, pt has already taken his dose today - daily INR. Will f/u and order dose on 7/30 - monitor for s/sx  - f/u chest CT  Rodney Friedman 12/17/2020,2:09 PM

## 2020-12-17 NOTE — Telephone Encounter (Signed)
Pt presented to the office today for coumadin check. As he was checking out, pt told office staff he has been experiencing on and off chest pain for a few months which has progressively gotten worse. Nurse spoke with pt and pt confirmed ago he is experiencing chest tightness that develop after walking from car to office. He denies any other symptoms other than slight SOB. Nurse offered to contact EMS but pt declined and state he will drive himself to [facility] which is roughly 5 minutes from the office. Pt also requested nurse contact son to make aware. Nurse left message on son's VM informing him that pt is in route to hospital.

## 2020-12-17 NOTE — Progress Notes (Signed)
  Echocardiogram 2D Echocardiogram has been performed.  Fidel Levy 12/17/2020, 3:33 PM

## 2020-12-18 DIAGNOSIS — I482 Chronic atrial fibrillation, unspecified: Secondary | ICD-10-CM

## 2020-12-18 DIAGNOSIS — I350 Nonrheumatic aortic (valve) stenosis: Secondary | ICD-10-CM

## 2020-12-18 DIAGNOSIS — J9601 Acute respiratory failure with hypoxia: Secondary | ICD-10-CM | POA: Diagnosis not present

## 2020-12-18 LAB — RESPIRATORY PANEL BY PCR

## 2020-12-18 LAB — STREP PNEUMONIAE URINARY ANTIGEN: Strep Pneumo Urinary Antigen: NEGATIVE

## 2020-12-18 LAB — CBC
HCT: 38.7 % — ABNORMAL LOW (ref 39.0–52.0)
Hemoglobin: 13.2 g/dL (ref 13.0–17.0)
MCH: 30.6 pg (ref 26.0–34.0)
MCHC: 34.1 g/dL (ref 30.0–36.0)
MCV: 89.6 fL (ref 80.0–100.0)
Platelets: 193 10*3/uL (ref 150–400)
RBC: 4.32 MIL/uL (ref 4.22–5.81)
RDW: 13.9 % (ref 11.5–15.5)
WBC: 14.1 10*3/uL — ABNORMAL HIGH (ref 4.0–10.5)
nRBC: 0 % (ref 0.0–0.2)

## 2020-12-18 LAB — BASIC METABOLIC PANEL
Anion gap: 11 (ref 5–15)
BUN: 23 mg/dL (ref 8–23)
CO2: 22 mmol/L (ref 22–32)
Calcium: 9.3 mg/dL (ref 8.9–10.3)
Chloride: 107 mmol/L (ref 98–111)
Creatinine, Ser: 0.93 mg/dL (ref 0.61–1.24)
GFR, Estimated: >60 mL/min (ref >60)
Glucose, Bld: 116 mg/dL — ABNORMAL HIGH (ref 70–99)
Potassium: 4.3 mmol/L (ref 3.5–5.1)
Sodium: 140 mmol/L (ref 135–145)

## 2020-12-18 LAB — GLUCOSE, CAPILLARY
Glucose-Capillary: 109 mg/dL — ABNORMAL HIGH (ref 70–99)
Glucose-Capillary: 108 mg/dL — ABNORMAL HIGH (ref 70–99)
Glucose-Capillary: 138 mg/dL — ABNORMAL HIGH (ref 70–99)
Glucose-Capillary: 153 mg/dL — ABNORMAL HIGH (ref 70–99)
Glucose-Capillary: 149 mg/dL — ABNORMAL HIGH (ref 70–99)
Glucose-Capillary: 155 mg/dL — ABNORMAL HIGH (ref 70–99)
Glucose-Capillary: 109 mg/dL — ABNORMAL HIGH (ref 70–99)

## 2020-12-18 LAB — TROPONIN I (HIGH SENSITIVITY): Troponin I (High Sensitivity): 18 ng/L — ABNORMAL HIGH (ref <18)

## 2020-12-18 LAB — MAGNESIUM: Magnesium: 1.9 mg/dL (ref 1.7–2.4)

## 2020-12-18 LAB — PROTIME-INR
INR: 2.2 — ABNORMAL HIGH (ref 0.8–1.2)
Prothrombin Time: 24.3 seconds — ABNORMAL HIGH (ref 11.4–15.2)

## 2020-12-18 LAB — PHOSPHORUS: Phosphorus: 4.6 mg/dL (ref 2.5–4.6)

## 2020-12-18 LAB — LACTIC ACID, PLASMA: Lactic Acid, Venous: 1 mmol/L (ref 0.5–1.9)

## 2020-12-18 MED ORDER — INSULIN ASPART 100 UNIT/ML IJ SOLN
0.0000 [IU] | Freq: Three times a day (TID) | INTRAMUSCULAR | Status: DC
  Administered 2020-12-18: 2 [IU] via SUBCUTANEOUS
  Administered 2020-12-18: 1 [IU] via SUBCUTANEOUS
  Administered 2020-12-19: 2 [IU] via SUBCUTANEOUS

## 2020-12-18 MED ORDER — WARFARIN - PHARMACIST DOSING INPATIENT: Freq: Every day | Status: DC

## 2020-12-18 MED ORDER — INSULIN ASPART 100 UNIT/ML IJ SOLN: 0.0000 [IU] | Freq: Three times a day (TID) | INTRAMUSCULAR | Status: DC

## 2020-12-18 MED ORDER — FUROSEMIDE 10 MG/ML IJ SOLN
40.0000 mg | Freq: Two times a day (BID) | INTRAMUSCULAR | Status: DC
  Administered 2020-12-18 – 2020-12-19 (×2): 40 mg via INTRAVENOUS
  Filled 2020-12-18 (×2): qty 4

## 2020-12-18 MED ORDER — WARFARIN SODIUM 4 MG PO TABS
4.0000 mg | ORAL_TABLET | Freq: Once | ORAL | Status: AC
  Administered 2020-12-18: 4 mg via ORAL
  Filled 2020-12-18: qty 1

## 2020-12-18 NOTE — Progress Notes (Signed)
RT went to check on pt to see if he is ready to go on BIPAP, pt said he doesn't want to wear it tonight. RT will continue to monitor.

## 2020-12-18 NOTE — Progress Notes (Signed)
Pt asked for BiPAP to be removed.  He stated "I cannot sleep with this mask on".   BiPAP mask was removed and it was replaced with nasal cannula @ 3L.

## 2020-12-18 NOTE — Progress Notes (Signed)
PROGRESS NOTE    Rodney Friedman  [id number] DOB: 1946-05-10 DOA: 12/17/2020 PCP: Kristie Cowman, MD     Brief Narrative:  Rodney Friedman is a 75 y.o. male with history of CAD status post CABG, moderate aortic stenosis with grade 1 diastolic dysfunction per 2D echo done in December 2021, COPD, ongoing tobacco abuse, A. fib, diabetes mellitus type 2 last hemoglobin A1c was 6.7 in April 26, 2020 presents to the ER with complaint of shortness of breath.  Patient states over the last 1 week patient has been getting increasingly short of breath mostly on exertion.  Has some nonproductive cough denies any chest pain.  Denies any orthopnea.  Has been compliant with his medications including Coumadin.  Chest x-ray showing left-sided infiltrates and pleural effusion concerning for pneumonia and possible CHF.  Patient was initially started on antibiotics for pneumonia.  Patient went to the bathroom and came back when he became very short of breath and had to be placed on nonrebreather and eventually BiPAP. PCCM was consulted.  New events last 24 hours / Subjective: Patient now off BiPAP and remains stable on 3 L nasal cannula O2.  Documented over 1.8 L urine output yesterday, 900 mL so far this morning.  His cough is minimal, states that his breathing has greatly improved since admission.  Assessment & Plan:   Principal Problem:   Acute respiratory failure with hypoxia (HCC) Active Problems:   CLL (chronic lymphocytic leukemia) (HCC)   Prostate cancer (HCC)   Coronary artery disease of native artery of native heart with stable angina pectoris (HCC)   Chronic atrial fibrillation (HCC)   Aortic valve stenosis, nonrheumatic   Paroxysmal atrial fibrillation (HCC)   CAP (community acquired pneumonia)   Acute hypoxemic respiratory failure secondary to acute diastolic heart failure exacerbation -BNP 362.3 -Appreciate PCCM -Continue IV Lasix -Strict I's and O's, fluid restriction, daily  weight -Doubt pneumonia, stop antibiotics and monitor  CAD status post CABG -Continue Crestor  Chronic A. fib -Continue Coumadin, Toprol  Hypertension -Continue Norvasc, Toprol, ARB  Diabetes mellitus type 2 -Hemoglobin A1c 6.6 -Hold metformin -Continue sliding scale insulin, Neurontin  History of CLL in remission  Mood disorder -Continue Wellbutrin  DVT prophylaxis:   warfarin (COUMADIN) tablet 4 mg  Code Status:     Code Status Orders  (From admission, onward)           Start     Ordered   12/17/20 1756  Do not attempt resuscitation (DNR)  Continuous       Question Answer Comment  In the event of cardiac or respiratory ARREST Do not call a "code blue"   In the event of cardiac or respiratory ARREST Do not perform Intubation, CPR, defibrillation or ACLS   In the event of cardiac or respiratory ARREST Use medication by any route, position, wound care, and other measures to relive pain and suffering. May use oxygen, suction and manual treatment of airway obstruction as needed for comfort.      12/17/20 1755           Code Status History     Date Active Date Inactive Code Status Order ID Comments User Context   12/17/2020 1350 12/17/2020 1755 Full Code [id number]  Rise Patience, MD ED      Advance Directive Documentation    Flowsheet Row Most Recent Value  Type of Advance Directive Out of facility DNR (pink MOST or yellow form), Healthcare Power of Attorney  Pre-existing out of facility DNR  order (yellow form or pink MOST form) Physician notified to receive inpatient order  "MOST" Form in Place? --      Family Communication: Rodney Friedman at bedside Disposition Plan:  Status is: Inpatient  Remains inpatient appropriate because:IV treatments appropriate due to intensity of illness or inability to take PO  Dispo: The patient is from: Home              Anticipated d/c is to: Home              Patient currently is not medically stable to d/c.   Difficult  to place patient No      Consultants:  PCCM  Procedures:  Rodney Friedman   Antimicrobials:  Anti-infectives (From admission, onward)    Start     Dose/Rate Route Frequency Ordered Stop   12/18/20 1200  cefTRIAXone (ROCEPHIN) 1 g in sodium chloride 0.9 % 100 mL IVPB  Status:  Discontinued        1 g 200 mL/hr over 30 Minutes Intravenous Every 24 hours 12/17/20 1939 12/18/20 0923   12/17/20 2200  doxycycline (VIBRAMYCIN) 100 mg in sodium chloride 0.9 % 250 mL IVPB  Status:  Discontinued        100 mg 125 mL/hr over 120 Minutes Intravenous Every 12 hours 12/17/20 1939 12/18/20 0923   12/17/20 1100  cefTRIAXone (ROCEPHIN) 1 g in sodium chloride 0.9 % 100 mL IVPB        1 g 200 mL/hr over 30 Minutes Intravenous  Once 12/17/20 1058 12/17/20 1322   12/17/20 1100  doxycycline (VIBRA-TABS) tablet 100 mg        100 mg Oral  Once 12/17/20 1058 12/17/20 1133        Objective: Vitals:   12/18/20 1027 12/18/20 1028 12/18/20 1029 12/18/20 1030  BP: (!) 117/34     Pulse: 80 (!) 107 72 (!) 120  Resp: (!) '28 20 19 '$ (!) 23  Temp:      TempSrc:      SpO2: 95% 96% 97% 97%  Weight:      Height:        Intake/Output Summary (Last 24 hours) at 12/18/2020 1142 Last data filed at 12/18/2020 1025 Gross per 24 hour  Intake 458.33 ml  Output 2700 ml  Net -2241.67 ml   Filed Weights   12/17/20 0936 12/18/20 0615  Weight: 98 kg 95.1 kg    Examination:  General exam: Appears calm and comfortable  Respiratory system: Clear to auscultation. Respiratory effort normal. No respiratory distress. No conversational dyspnea.  On 3 L oxygen Cardiovascular system: S1 & S2 heard, irregular rhythm. No murmurs. No pedal edema. Gastrointestinal system: Abdomen is nondistended, soft and nontender. Normal bowel sounds heard. Central nervous system: Alert and oriented. No focal neurological deficits. Speech clear.  Extremities: Symmetric in appearance  Skin: No rashes, lesions or ulcers on exposed skin   Psychiatry: Judgement and insight appear normal. Mood & affect appropriate.   Data Reviewed: I have personally reviewed following labs and imaging studies  CBC: Recent Labs  Lab 12/17/20 0939 12/18/20 0938  WBC 14.7* 14.1*  HGB 12.4* 13.2  HCT 37.4* 38.7*  MCV 91.4 89.6  PLT 194 0000000   Basic Metabolic Panel: Recent Labs  Lab 12/17/20 0939 12/18/20 0230 12/18/20 0817  NA 144  --  140  K 4.3  --  4.3  CL 113*  --  107  CO2 18*  --  22  GLUCOSE 139*  --  116*  BUN  20  --  23  CREATININE 0.91  --  0.93  CALCIUM 9.4  --  9.3  MG  --  1.9  --   PHOS  --  4.6  --    GFR: Estimated Creatinine Clearance: 79.1 mL/min (by C-G formula based on SCr of 0.93 mg/dL). Liver Function Tests: Recent Labs  Lab 12/17/20 1315  AST 29  ALT 33  ALKPHOS 38  BILITOT 0.7  PROT 6.7  ALBUMIN 3.8   No results for input(s): LIPASE, AMYLASE in the last 168 hours. No results for input(s): AMMONIA in the last 168 hours. Coagulation Profile: Recent Labs  Lab 12/17/20 0811 12/17/20 1635 12/18/20 0230  INR 2.2 3.6* 2.2*   Cardiac Enzymes: No results for input(s): CKTOTAL, CKMB, CKMBINDEX, TROPONINI in the last 168 hours. BNP (last 3 results) No results for input(s): PROBNP in the last 8760 hours. HbA1C: No results for input(s): HGBA1C in the last 72 hours. CBG: Recent Labs  Lab 12/17/20 1730 12/17/20 2211 12/18/20 0215 12/18/20 0554 12/18/20 0757  GLUCAP 104* 155* 109* 108* 138*   Lipid Profile: No results for input(s): CHOL, HDL, LDLCALC, TRIG, CHOLHDL, LDLDIRECT in the last 72 hours. Thyroid Function Tests: No results for input(s): TSH, T4TOTAL, FREET4, T3FREE, THYROIDAB in the last 72 hours. Anemia Panel: No results for input(s): VITAMINB12, FOLATE, FERRITIN, TIBC, IRON, RETICCTPCT in the last 72 hours. Sepsis Labs: Recent Labs  Lab 12/17/20 1315 12/17/20 1635 12/18/20 0230  PROCALCITON <0.10  --   --   LATICACIDVEN  --  1.8 1.0    Recent Results (from the past  240 hour(s))  Resp Panel by RT-PCR (Flu A&B, Covid) Nasopharyngeal Swab     Status: Rodney Friedman   Collection Time: 12/17/20 11:04 AM   Specimen: Nasopharyngeal Swab; Nasopharyngeal(NP) swabs in vial transport medium  Result Value Ref Range Status   SARS Coronavirus 2 by RT PCR NEGATIVE NEGATIVE Final    Comment: (NOTE) SARS-CoV-2 target nucleic acids are NOT DETECTED.  The SARS-CoV-2 RNA is generally detectable in upper respiratory specimens during the acute phase of infection. The lowest concentration of SARS-CoV-2 viral copies this assay can detect is 138 copies/mL. A negative result does not preclude SARS-Cov-2 infection and should not be used as the sole basis for treatment or other patient management decisions. A negative result may occur with  improper specimen collection/handling, submission of specimen other than nasopharyngeal swab, presence of viral mutation(s) within the areas targeted by this assay, and inadequate number of viral copies(<138 copies/mL). A negative result must be combined with clinical observations, patient history, and epidemiological information. The expected result is Negative.  Fact Sheet for Patients:  EntrepreneurPulse.com.au  Fact Sheet for Healthcare Providers:  IncredibleEmployment.be  This test is no t yet approved or cleared by the [place] FDA and  has been authorized for detection and/or diagnosis of SARS-CoV-2 by FDA under an Emergency Use Authorization (EUA). This EUA will remain  in effect (meaning this test can be used) for the duration of the COVID-19 declaration under Section 564(b)(1) of the Act, 21 U.S.C.section 360bbb-3(b)(1), unless the authorization is terminated  or revoked sooner.       Influenza A by PCR NEGATIVE NEGATIVE Final   Influenza B by PCR NEGATIVE NEGATIVE Final    Comment: (NOTE) The Xpert Xpress SARS-CoV-2/FLU/RSV plus assay is intended as an aid in the diagnosis of influenza  from Nasopharyngeal swab specimens and should not be used as a sole basis for treatment. Nasal washings and aspirates are  unacceptable for Xpert Xpress SARS-CoV-2/FLU/RSV testing.  Fact Sheet for Patients: EntrepreneurPulse.com.au  Fact Sheet for Healthcare Providers: IncredibleEmployment.be  This test is not yet approved or cleared by the [place] FDA and has been authorized for detection and/or diagnosis of SARS-CoV-2 by FDA under an Emergency Use Authorization (EUA). This EUA will remain in effect (meaning this test can be used) for the duration of the COVID-19 declaration under Section 564(b)(1) of the Act, 21 U.S.C. section 360bbb-3(b)(1), unless the authorization is terminated or revoked.  Performed at [facility], [place] [address] [place], [place] [address]   Respiratory (~20 pathogens) panel by PCR     Status: Rodney Friedman   Collection Time: 12/17/20  3:13 PM   Specimen: Nasopharyngeal Swab; Respiratory  Result Value Ref Range Status   Adenovirus NOT DETECTED NOT DETECTED Final   Coronavirus 229E NOT DETECTED NOT DETECTED Final    Comment: (NOTE) The Coronavirus on the Respiratory Panel, DOES NOT test for the novel  Coronavirus (2019 nCoV)    Coronavirus HKU1 NOT DETECTED NOT DETECTED Final   Coronavirus NL63 NOT DETECTED NOT DETECTED Final   Coronavirus OC43 NOT DETECTED NOT DETECTED Final   Metapneumovirus NOT DETECTED NOT DETECTED Final   Rhinovirus / Enterovirus NOT DETECTED NOT DETECTED Final   Influenza A NOT DETECTED NOT DETECTED Final   Influenza B NOT DETECTED NOT DETECTED Final   Parainfluenza Virus 1 NOT DETECTED NOT DETECTED Final   Parainfluenza Virus 2 NOT DETECTED NOT DETECTED Final   Parainfluenza Virus 3 NOT DETECTED NOT DETECTED Final   Parainfluenza Virus 4 NOT DETECTED NOT DETECTED Final   Respiratory Syncytial Virus NOT DETECTED NOT DETECTED Final   Bordetella pertussis NOT DETECTED NOT  DETECTED Final   Bordetella Parapertussis NOT DETECTED NOT DETECTED Final   Chlamydophila pneumoniae NOT DETECTED NOT DETECTED Final   Mycoplasma pneumoniae NOT DETECTED NOT DETECTED Final    Comment: Performed at [facility], [place]. [address] [place], [place] [address]  MRSA Next Gen by PCR, Nasal     Status: Rodney Friedman   Collection Time: 12/17/20  4:06 PM   Specimen: Nasal Mucosa; Nasal Swab  Result Value Ref Range Status   MRSA by PCR Next Gen NOT DETECTED NOT DETECTED Final    Comment: (NOTE) The GeneXpert MRSA Assay (FDA approved for NASAL specimens only), is one component of a comprehensive MRSA colonization surveillance program. It is not intended to diagnose MRSA infection nor to guide or monitor treatment for MRSA infections. Test performance is not FDA approved in patients less than 53 years old. Performed at [facility], [place] [address] [place], [place] [address]       Radiology Studies: DG Chest 2 View  Result Date: 12/17/2020 CLINICAL DATA:  chest pain and SOB EXAM: CHEST - 2 VIEW COMPARISON:  06/04/2014. FINDINGS: Enlarged cardiac silhouette, similar to prior. Median sternotomy. Small right pleural effusion. Possible small left pleural effusion. Patchy left basilar airspace opacities. No visible pleural effusions or pneumothorax. IMPRESSION: 1. Patchy left basilar airspace opacities, suspicious for pneumonia. 2. Cardiomegaly.  Small right (and possibly left) pleural effusions. Electronically Signed   By: Margaretha Sheffield MD   On: 12/17/2020 10:54   CT Angio Chest Pulmonary Embolism (PE) W or WO Contrast  Result Date: 12/17/2020 CLINICAL DATA:  Eight day history of chest pain and shortness of breath. EXAM: CT ANGIOGRAPHY CHEST WITH CONTRAST TECHNIQUE: Multidetector CT imaging of the chest was performed using the standard protocol during bolus administration  of intravenous contrast. Multiplanar CT image reconstructions and MIPs were obtained to evaluate  the vascular anatomy. CONTRAST:  14m OMNIPAQUE IOHEXOL 350 MG/ML SOLN COMPARISON:  Prior PET-CT 02/11/2020 FINDINGS: Cardiovascular: Satisfactory opacification of the pulmonary arteries to the segmental level. No evidence of pulmonary embolism. Normal heart size. No pericardial effusion. Cardiomegaly with left atrial dilation. Calcification of the aortic valve and mitral valve annulus. Extensive calcifications of the native coronary arteries. Patient is status post median sternotomy with evidence of multivessel CABG. No evidence of aortic aneurysm. Aortic atherosclerotic calcifications are present. Mediastinum/Nodes: No enlarged mediastinal, hilar, or axillary lymph nodes. Thyroid gland, trachea, and esophagus demonstrate no significant findings. Lungs/Pleura: Mild interlobular septal thickening consistent with trace interstitial edema. Peribronchovascular distribution of patchy ground-glass attenuation airspace opacities in both lower lobes. Small bilateral layering pleural effusions. Upper Abdomen: No acute abnormality. Musculoskeletal: No chest wall abnormality. No acute or significant osseous findings. Healed median sternotomy. Review of the MIP images confirms the above findings. IMPRESSION: 1. Negative for acute pulmonary embolus. 2. Peribronchovascular distribution of patchy airspace opacities in both lower lobes. Differential considerations include multi lobar pneumonia, aspiration (given the clearly dependent distribution), and less likely dependant pulmonary edema. 3. Cardiomegaly including left atrial enlargement and trace interstitial edema. 4. Calcified aortic valve and mitral valve annulus. 5. Coronary and aortic atherosclerotic calcifications. Aortic Atherosclerosis (ICD10-I70.0). Electronically Signed   By: HJacqulynn CadetM.D.   On: 12/17/2020 14:41   DG CHEST PORT 1 VIEW  Result Date: 12/17/2020 CLINICAL DATA:  Chest pain and shortness of breath for 8 days EXAM: PORTABLE CHEST 1 VIEW  COMPARISON:  12/17/2020, 10:18 a.m. FINDINGS: Cardiomegaly status post median sternotomy and CABG. Layering bilateral pleural effusions. The visualized skeletal structures are unremarkable. IMPRESSION: Cardiomegaly with layering bilateral pleural effusions. Electronically Signed   By: AEddie CandleM.D.   On: 12/17/2020 16:14   ECHOCARDIOGRAM COMPLETE  Result Date: 12/17/2020    ECHOCARDIOGRAM REPORT   Patient Name:   CLONNY BARTCHDate of Exam: 12/17/2020 Medical Rec #:  [id number]      Height:       68.0 in Accession #:    [id number]     Weight:       216.0 lb Date of Birth:  [birth year]      BSA:          2.112 m Patient Age:    89years        BP:           130/47 mmHg Patient Gender: M               HR:           77 bpm. Exam Location:  Inpatient Procedure: 2D Echo, Cardiac Doppler and Color Doppler                     STAT ECHO Reported to: Dr. ROval Linseyon 12/17/2020 3:31:00 AM. Indications:    Dyspnea R06.00  History:        Patient has prior history of Echocardiogram examinations, most                 recent 05/03/2020. CAD and Previous Myocardial Infarction,                 Arrythmias:Atrial Fibrillation; Risk Factors:ETOH, Diabetes,                 Dyslipidemia and Hypertension.  Sonographer:    SBernadene PersonRDCS Referring  Phys: [address] [facility]  Sonographer Comments: Technically difficult study due to poor echo windows. Image acquisition challenging due to patient body habitus. Pt unable to turn on left side. IMPRESSIONS  1. Left ventricular ejection fraction, by estimation, is 60 to 65%. The left ventricle has normal function. The left ventricle has no regional wall motion abnormalities. Left ventricular diastolic parameters are indeterminate.  2. Right ventricular systolic function is normal. The right ventricular size is normal. There is normal pulmonary artery systolic pressure.  3. Left atrial size was severely dilated.  4. Predominantly posterior mitral annular calcification. The  mitral valve is normal in structure. Trivial mitral valve regurgitation. No evidence of mitral stenosis. Moderate mitral annular calcification.  5. Compared with the echo 04/2020, mean aortic valve gradient has increased from 25 mmHg to 33 mmHg. The aortic valve is normal in structure. There is moderate calcification of the aortic valve. There is moderate thickening of the aortic valve. Aortic valve regurgitation is mild. Moderate aortic valve stenosis. Aortic regurgitation PHT measures 428 msec. Aortic valve mean gradient measures 33.5 mmHg. Aortic valve Vmax measures 3.79 m/s.  6. Aortic dilatation noted. There is mild dilatation of the ascending aorta, measuring 37 mm.  7. The inferior vena cava is dilated in size with >50% respiratory variability, suggesting right atrial pressure of 8 mmHg. FINDINGS  Left Ventricle: Left ventricular ejection fraction, by estimation, is 60 to 65%. The left ventricle has normal function. The left ventricle has no regional wall motion abnormalities. The left ventricular internal cavity size was normal in size. There is  no left ventricular hypertrophy. Left ventricular diastolic parameters are indeterminate. Right Ventricle: The right ventricular size is normal. No increase in right ventricular wall thickness. Right ventricular systolic function is normal. There is normal pulmonary artery systolic pressure. The tricuspid regurgitant velocity is 2.39 m/s, and  with an assumed right atrial pressure of 8 mmHg, the estimated right ventricular systolic pressure is [id number] mmHg. Left Atrium: Left atrial size was severely dilated. Right Atrium: Right atrial size was normal in size. Pericardium: There is no evidence of pericardial effusion. Mitral Valve: Predominantly posterior mitral annular calcification. The mitral valve is normal in structure. Moderate mitral annular calcification. Trivial mitral valve regurgitation. No evidence of mitral valve stenosis. Tricuspid Valve: The tricuspid  valve is normal in structure. Tricuspid valve regurgitation is trivial. No evidence of tricuspid stenosis. Aortic Valve: Compared with the echo 04/2020, mean aortic valve gradient has increased from 25 mmHg to 33 mmHg. The aortic valve is normal in structure. There is moderate calcification of the aortic valve. There is moderate thickening of the aortic valve. Aortic valve regurgitation is mild. Aortic regurgitation PHT measures 428 msec. Moderate aortic stenosis is present. Aortic valve mean gradient measures 33.5 mmHg. Aortic valve peak gradient measures 57.5 mmHg. Aortic valve area, by VTI measures 0.59 cm. Pulmonic Valve: The pulmonic valve was normal in structure. Pulmonic valve regurgitation is not visualized. No evidence of pulmonic stenosis. Aorta: Aortic dilatation noted. There is mild dilatation of the ascending aorta, measuring 37 mm. Venous: The inferior vena cava is dilated in size with greater than 50% respiratory variability, suggesting right atrial pressure of 8 mmHg. IAS/Shunts: No atrial level shunt detected by color flow Doppler.  LEFT VENTRICLE PLAX 2D LVIDd:         4.30 cm LVIDs:         2.40 cm LV PW:         1.40 cm LV IVS:  1.40 cm LVOT diam:     2.20 cm LV SV:         58 LV SV Index:   27 LVOT Area:     3.80 cm  RIGHT VENTRICLE TAPSE (M-mode): 1.5 cm LEFT ATRIUM             Index       RIGHT ATRIUM           Index LA diam:        5.30 cm 2.51 cm/m  RA Area:     19.40 cm LA Vol (A2C):   89.0 ml 42.15 ml/m RA Volume:   51.20 ml  24.25 ml/m LA Vol (A4C):   80.3 ml 38.03 ml/m LA Biplane Vol: 93.9 ml 44.47 ml/m  AORTIC VALVE AV Area (Vmax):    0.67 cm AV Area (Vmean):   0.71 cm AV Area (VTI):     0.59 cm AV Vmax:           379.00 cm/s AV Vmean:          263.500 cm/s AV VTI:            0.979 m AV Peak Grad:      57.5 mmHg AV Mean Grad:      33.5 mmHg LVOT Vmax:         66.75 cm/s LVOT Vmean:        49.100 cm/s LVOT VTI:          0.152 m LVOT/AV VTI ratio: 0.16 AI PHT:             428 msec  AORTA Ao Root diam: 3.60 cm Ao Asc diam:  3.70 cm TRICUSPID VALVE TR Peak grad:   22.8 mmHg TR Vmax:        239.00 cm/s  SHUNTS Systemic VTI:  0.15 m Systemic Diam: 2.20 cm Skeet Latch MD Electronically signed by Skeet Latch MD Signature Date/Time: 12/17/2020/3:48:13 PM    Final       Scheduled Meds:  amLODipine  10 mg Oral Daily   buPROPion ER  200 mg Oral q morning   And   buPROPion ER  100 mg Oral QHS   chlorhexidine  15 mL Mouth Rinse BID   Chlorhexidine Gluconate Cloth  6 each Topical Daily   ezetimibe  10 mg Oral Daily   furosemide  40 mg Intravenous Q8H   gabapentin  300 mg Oral QHS   insulin aspart  0-9 Units Subcutaneous Q4H   irbesartan  300 mg Oral Daily   latanoprost  1 drop Left Eye QODAY   mouth rinse  15 mL Mouth Rinse q12n4p   metoprolol  100 mg Oral BID   rosuvastatin  20 mg Oral Daily   vitamin B-12  500 mcg Oral QODAY   warfarin  4 mg Oral ONCE-1600   Warfarin - Pharmacist Dosing Inpatient   Does not apply q1600   Continuous Infusions:   LOS: 1 day      Time spent: 25 minutes   Dessa Phi, DO [facility] 12/18/2020, 11:42 AM   Available via Epic secure chat 7am-7pm After these hours, please refer to coverage provider listed on [email or web address]

## 2020-12-18 NOTE — Plan of Care (Signed)

## 2020-12-18 NOTE — Progress Notes (Signed)
[place] for warfarin Indication: hx atrial fibrillation  Allergies  Allergen Reactions   Other Rash    Allergen: "Plants and bushes while doing yard work"    Patient Measurements: Height: '5\' 8"'$  (172.7 cm) Weight: 95.1 kg (209 lb 10.5 oz) IBW/kg (Calculated) : 68.4 Heparin Dosing Weight:   Vital Signs: Temp: 97.5 F (36.4 C) (07/30 0821) Temp Source: Oral (07/30 0821) BP: 113/48 (07/30 0936) Pulse Rate: 83 (07/30 0936)  Labs: Recent Labs    12/17/20 0811 12/17/20 0939 12/17/20 1139 12/17/20 1315 12/17/20 1635 12/18/20 0230 12/18/20 0511 12/18/20 0817 12/18/20 0938  HGB  --  12.4*  --   --   --   --   --   --  13.2  HCT  --  37.4*  --   --   --   --   --   --  38.7*  PLT  --  194  --   --   --   --   --   --  193  LABPROT  --   --   --   --  36.2* 24.3*  --   --   --   INR 2.2  --   --   --  3.6* 2.2*  --   --   --   CREATININE  --  0.91  --   --   --   --   --  0.93  --   TROPONINIHS  --  14   < > 13 13  --  18*  --   --    < > = values in this interval not displayed.     Estimated Creatinine Clearance: 79.1 mL/min (by C-G formula based on SCr of 0.93 mg/dL).   Medications:  PTA warfarin regimen: '4mg'$  daily except '2mg'$  on Tue and Fri per [facility] clinic's note on 7/29  Assessment: Patient is a 75 y.o M with hx CAD (s/p CABG) and chronic afib on warfarin PTA presented to the ED on 7/29 with c/o CP.   Today, 12/18/2020: INR therapeutic troponin 18 cbc good no reported bleeding  Goal of Therapy:  INR 2-3 Monitor platelets by anticoagulation protocol: Yes   Plan:  Warfarin '4mg'$  today per home regimen as above daily INR.  monitor for s/sx    Kara Mead 12/18/2020,10:03 AM

## 2020-12-18 NOTE — Progress Notes (Signed)
Pt was found without BIPAP on him. He is currently on 3L [place] and sleeping comfortably. VS stable. BIPAP stand by.

## 2020-12-18 NOTE — Progress Notes (Signed)
BIPAP is only on standby at this time. He is currently tolerating 3lpm [place] with no distress

## 2020-12-18 NOTE — Progress Notes (Signed)
NAME:  Rodney Friedman, MRN:  [id number], DOB:  [birth year], LOS: 1 ADMISSION DATE:  12/17/2020, CONSULTATION DATE:  12/18/20 REFERRING MD:  Hal Hope, CHIEF COMPLAINT:  chest pain and shortness of breath   History of Present Illness:  75 year old male with past medical history of chronic atrial fibrillation, aortic stenosis, CAD status post CABG in 2006 with unremarkable nuclear stress test 2013, mild aortic stenosis, COPD, CLL and prostate cancer both in remission, hypertension, hyperlipidemia, type 2 diabetes, OSA who presented to the emergency department today after going to his outpatient INR check and he mentioned that he has been experiencing intermittent chest pain and shortness of breath typically with exertion so was referred to the ED.  On arrival his vital signs were stable, EKG without signs of STEMI.  He was placed on BiPAP and PCCM consulted   Further work-up significant for get of high-sensitivity troponin x2, BNP 362, negative COVID-19, white blood cell count of 14 K (chronically elevated).  His last echo was 6 months ago which showed stable AAS, grade 1 diastolic dysfunction with an EF of 60 to 65% and severe left atrial dilation.      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    Significant Hospital Events: Including procedures, antibiotic start and stop dates in addition to other pertinent events   7/29 presented to the ED with intermittent chest pain, trop negative and no acute  ischemia, required Bipap, PCCM consult  Echo 7/29 Mod AS with severe LAE with nl ef Urine strep neg 7/29   Scheduled Meds:  amLODipine  10 mg Oral Daily   buPROPion ER  200 mg Oral q morning   And   buPROPion ER  100 mg Oral QHS   chlorhexidine  15 mL Mouth Rinse BID   Chlorhexidine Gluconate Cloth  6 each Topical Daily   ezetimibe  10 mg Oral Daily   furosemide  40 mg Intravenous Q8H   gabapentin  300 mg Oral QHS   insulin aspart  0-9 Units Subcutaneous Q4H   irbesartan  300 mg Oral Daily   latanoprost  1 drop Left Eye QODAY   mouth rinse  15 mL Mouth Rinse q12n4p   metoprolol  100 mg Oral BID   rosuvastatin  20 mg Oral Daily   vitamin B-12  500 mcg Oral QODAY   Continuous Infusions:  cefTRIAXone (ROCEPHIN)  IV     doxycycline (VIBRAMYCIN) IV Stopped (12/18/20 0010)   PRN Meds:.acetaminophen **OR** acetaminophen, levalbuterol    Interim History / Subjective:  Could not tolerate bipap but did fine s it     Objective   Blood pressure (!) 123/54, pulse 79, temperature (!) 97.5 F (36.4 C), temperature source Oral, resp. rate (!) 35, height '5\' 8"'$  (1.727 m), weight 95.1 kg, SpO2 94 %.        Intake/Output Summary (Last 24 hours) at 12/18/2020 0631 Last data filed at 12/18/2020 0614 Gross per 24 hour  Intake 100 ml  Output 1800 ml  Net -1700 ml   Filed Weights   12/17/20 0936 12/18/20 0615  Weight: 98 kg 95.1 kg    Tmax 98.2  Roc /  doxy started 7/29  General appearance:    elderly wm nad   At Rest 02 sats  94% on 3lpm  No jvd Oropharynx clear,  mucosa nl Neck supple Lungs with min exp > insp rhonchi bilaterally, crackles on insp in bases IRIR  with 3/6 sem, reduced A2  Abd obese with nl excursion  Extr warm with no edema or clubbing noted Neuro  Sensorium intact,  no apparent motor deficits    I personally reviewed images and agree with radiology impression as follows:  CXR:   portable 7/29 Cardiomegaly with layering bilateral pleural effusions.     Resolved Hospital Problem list     Assessment & Plan:   Acute respiratory distress and chest pain In the setting of baseline CAD, AS and COPD, HFpEF, atrial fibrillation, hypertension, hyperlipidemia Follows with Cardiologist Dr. Keene Breath - cxr/ pct not c/w pna, favor chf with bilateral effusions and he has improved overnight with neg 1700 cc I/0   P Incentive Spirometer -agree with ceftriaxone and doxycycline for now but low threshold to stop - no need for cpap or bipap at this point, tolerating Nasal 02    2) Aortic stenosis, rapid afib/flutter with possible AP  >>> cards input/ close f/u needed here.   PCCM service is available prn     Best Practice (right click and "Reselect all SmartList Selections" daily)   Per Primary  Labs   CBC: Recent Labs  Lab 12/17/20 0939  WBC 14.7*  HGB 12.4*  HCT 37.4*  MCV 91.4  PLT [id number]    Basic Metabolic Panel: Recent Labs  Lab 12/17/20 0939 12/18/20 0230  NA 144  --   K 4.3  --   CL 113*  --   CO2 18*  --   GLUCOSE 139*  --   BUN 20  --   CREATININE 0.91  --   CALCIUM 9.4  --   MG  --  1.9  PHOS  --  4.6   GFR: Estimated Creatinine Clearance: 80.9 mL/min (by C-G formula based on SCr of 0.91 mg/dL). Recent Labs  Lab 12/17/20 0939 12/17/20 1315 12/17/20 1635 12/18/20 0230  PROCALCITON  --  <0.10  --   --   WBC 14.7*  --   --   --   LATICACIDVEN  --   --  1.8 1.0    Liver Function Tests: Recent Labs  Lab 12/17/20 1315  AST 29  ALT 33  ALKPHOS 38  BILITOT 0.7  PROT 6.7  ALBUMIN 3.8   No results for input(s): LIPASE, AMYLASE in the last 168 hours. No results for input(s): AMMONIA in the last 168 hours.  ABG    Component Value Date/Time   PHART 7.376 12/17/2020 1420   PCO2ART 33.7 12/17/2020 1420   PO2ART 149 (H) 12/17/2020 1420   HCO3 19.3 (L) 12/17/2020 1420   ACIDBASEDEF 4.7 (H) 12/17/2020 1420   O2SAT 99.3 12/17/2020 1420     Coagulation Profile: Recent Labs  Lab 12/17/20 0811  12/17/20 1635 12/18/20 0230  INR 2.2 3.6* 2.2*    Cardiac Enzymes: No results for input(s): CKTOTAL, CKMB, CKMBINDEX, TROPONINI in the last 168 hours.  HbA1C: Hgb A1c MFr Bld  Date/Time Value Ref Range Status  04/26/2020 09:41 AM 6.6 (H) 4.8 - 5.6 % Final    Comment:             Prediabetes: 5.7 - 6.4          Diabetes: >6.4  Glycemic control for adults with diabetes: <7.0   10/27/2019 08:45 AM 6.2 (H) 4.8 - 5.6 % Final    Comment:             Prediabetes: 5.7 - 6.4          Diabetes: >6.4          Glycemic control for adults with diabetes: <7.0     CBG: Recent Labs  Lab 12/17/20 1730 12/17/20 2211 12/18/20 0215 12/18/20 0554  GLUCAP 104* 155* 109* 108*       Christinia Gully, MD Pul[facility] [place] [phone]   After 7:00 pm call Elink  [phone]

## 2020-12-19 DIAGNOSIS — J9601 Acute respiratory failure with hypoxia: Secondary | ICD-10-CM | POA: Diagnosis not present

## 2020-12-19 LAB — GLUCOSE, CAPILLARY
Glucose-Capillary: 202 mg/dL — ABNORMAL HIGH (ref 70–99)
Glucose-Capillary: 118 mg/dL — ABNORMAL HIGH (ref 70–99)
Glucose-Capillary: 162 mg/dL — ABNORMAL HIGH (ref 70–99)
Glucose-Capillary: 103 mg/dL — ABNORMAL HIGH (ref 70–99)

## 2020-12-19 LAB — CBC
HCT: 39.4 % (ref 39.0–52.0)
Hemoglobin: 13.4 g/dL (ref 13.0–17.0)
MCH: 30.5 pg (ref 26.0–34.0)
MCHC: 34 g/dL (ref 30.0–36.0)
MCV: 89.7 fL (ref 80.0–100.0)
Platelets: 202 10*3/uL (ref 150–400)
RBC: 4.39 MIL/uL (ref 4.22–5.81)
RDW: 13.9 % (ref 11.5–15.5)
WBC: 14.7 10*3/uL — ABNORMAL HIGH (ref 4.0–10.5)
nRBC: 0 % (ref 0.0–0.2)

## 2020-12-19 LAB — BASIC METABOLIC PANEL
Anion gap: 8 (ref 5–15)
BUN: 28 mg/dL — ABNORMAL HIGH (ref 8–23)
CO2: 26 mmol/L (ref 22–32)
Calcium: 8.8 mg/dL — ABNORMAL LOW (ref 8.9–10.3)
Chloride: 105 mmol/L (ref 98–111)
Creatinine, Ser: 1.17 mg/dL (ref 0.61–1.24)
GFR, Estimated: >60 mL/min (ref >60)
Glucose, Bld: 174 mg/dL — ABNORMAL HIGH (ref 70–99)
Potassium: 3.6 mmol/L (ref 3.5–5.1)
Sodium: 139 mmol/L (ref 135–145)

## 2020-12-19 LAB — PSA, TOTAL AND FREE
PSA, Free Pct: >10 %
PSA, Free: 0.01 ng/mL
Prostate Specific Ag, Serum: <0.1 ng/mL (ref 0.0–4.0)

## 2020-12-19 LAB — PROTIME-INR
INR: 1.9 — ABNORMAL HIGH (ref 0.8–1.2)
Prothrombin Time: 21.6 seconds — ABNORMAL HIGH (ref 11.4–15.2)

## 2020-12-19 MED ORDER — METOPROLOL SUCCINATE ER 25 MG PO TB24
25.0000 mg | ORAL_TABLET | Freq: Two times a day (BID) | ORAL | Status: DC
  Administered 2020-12-19 – 2020-12-20 (×3): 25 mg via ORAL
  Filled 2020-12-19 (×3): qty 1

## 2020-12-19 MED ORDER — WARFARIN SODIUM 4 MG PO TABS
4.0000 mg | ORAL_TABLET | Freq: Once | ORAL | Status: AC
  Administered 2020-12-19: 4 mg via ORAL
  Filled 2020-12-19: qty 1

## 2020-12-19 MED ORDER — POLYETHYLENE GLYCOL 3350 17 G PO PACK
17.0000 g | PACK | Freq: Every day | ORAL | Status: DC
  Administered 2020-12-19: 17 g via ORAL
  Filled 2020-12-19 (×2): qty 1

## 2020-12-19 NOTE — Progress Notes (Signed)
PROGRESS NOTE    Rodney Friedman  [id number] DOB: 1946/02/18 DOA: 12/17/2020 PCP: Kristie Cowman, MD     Brief Narrative:  Rodney Friedman is a 75 y.o. male with history of CAD status post CABG, moderate aortic stenosis with grade 1 diastolic dysfunction per 2D echo done in December 2021, COPD, ongoing tobacco abuse, A. fib, diabetes mellitus type 2 last hemoglobin A1c was 6.7 in April 26, 2020 presents to the ER with complaint of shortness of breath.  Patient states over the last 1 week patient has been getting increasingly short of breath mostly on exertion.  Has some nonproductive cough denies any chest pain.  Denies any orthopnea.  Has been compliant with his medications including Coumadin.  Chest x-ray showing left-sided infiltrates and pleural effusion concerning for pneumonia and possible CHF.  Patient was initially started on antibiotics for pneumonia.  Patient went to the bathroom and came back when he became very short of breath and had to be placed on nonrebreather and eventually BiPAP. PCCM was consulted.  New events last 24 hours / Subjective: Remains on 3 L nasal cannula O2, not on oxygen at baseline.  States that he is feeling much better, no longer having chest tightness.  Assessment & Plan:   Principal Problem:   Acute respiratory failure with hypoxia (HCC) Active Problems:   CLL (chronic lymphocytic leukemia) (HCC)   Prostate cancer (HCC)   Coronary artery disease of native artery of native heart with stable angina pectoris (HCC)   Chronic atrial fibrillation (HCC)   Aortic valve stenosis, nonrheumatic   Paroxysmal atrial fibrillation (HCC)   CAP (community acquired pneumonia)   Acute hypoxemic respiratory failure secondary to acute diastolic heart failure exacerbation -BNP 362.3 -Appreciate PCCM -Echocardiogram revealed EF 60 to 65%, Moderate aortic valve stenosis -Continue IV Lasix, plan to transition to p.o. tomorrow -Strict I's and O's, fluid restriction,  daily weight -Doubt pneumonia, stop antibiotics and monitor -Wean oxygen today  CAD status post CABG x3 -Continue Crestor  Chronic A. fib -Continue Coumadin, Toprol  Hypertension -Continue Toprol, decreased dose today.  Hold Norvasc and ARB due to marginal blood pressure goal 9/40 and hypotension overnight 86/24  Diabetes mellitus type 2 -Hemoglobin A1c 6.6 -Hold metformin -Continue sliding scale insulin, Neurontin  History of CLL in remission  Mood disorder -Continue Wellbutrin  DVT prophylaxis:   warfarin (COUMADIN) tablet 4 mg  Code Status:     Code Status Orders  (From admission, onward)           Start     Ordered   12/17/20 1756  Do not attempt resuscitation (DNR)  Continuous       Question Answer Comment  In the event of cardiac or respiratory ARREST Do not call a "code blue"   In the event of cardiac or respiratory ARREST Do not perform Intubation, CPR, defibrillation or ACLS   In the event of cardiac or respiratory ARREST Use medication by any route, position, wound care, and other measures to relive pain and suffering. May use oxygen, suction and manual treatment of airway obstruction as needed for comfort.      12/17/20 1755           Code Status History     Date Active Date Inactive Code Status Order ID Comments User Context   12/17/2020 1350 12/17/2020 1755 Full Code [id number]  Rise Patience, MD ED      Advance Directive Documentation    Flowsheet Row Most Recent Value  Type of  Advance Directive Out of facility DNR (pink MOST or yellow form), Healthcare Power of Attorney  Pre-existing out of facility DNR order (yellow form or pink MOST form) Physician notified to receive inpatient order  "MOST" Form in Place? --      Family Communication: Friend at bedside Disposition Plan:  Status is: Inpatient  Remains inpatient appropriate because:IV treatments appropriate due to intensity of illness or inability to take PO  Dispo: The  patient is from: Home              Anticipated d/c is to: Home              Patient currently is not medically stable to d/c.   Difficult to place patient No      Consultants:  PCCM  Procedures:  None   Antimicrobials:  Anti-infectives (From admission, onward)    Start     Dose/Rate Route Frequency Ordered Stop   12/18/20 1200  cefTRIAXone (ROCEPHIN) 1 g in sodium chloride 0.9 % 100 mL IVPB  Status:  Discontinued        1 g 200 mL/hr over 30 Minutes Intravenous Every 24 hours 12/17/20 1939 12/18/20 0923   12/17/20 2200  doxycycline (VIBRAMYCIN) 100 mg in sodium chloride 0.9 % 250 mL IVPB  Status:  Discontinued        100 mg 125 mL/hr over 120 Minutes Intravenous Every 12 hours 12/17/20 1939 12/18/20 0923   12/17/20 1100  cefTRIAXone (ROCEPHIN) 1 g in sodium chloride 0.9 % 100 mL IVPB        1 g 200 mL/hr over 30 Minutes Intravenous  Once 12/17/20 1058 12/17/20 1322   12/17/20 1100  doxycycline (VIBRA-TABS) tablet 100 mg        100 mg Oral  Once 12/17/20 1058 12/17/20 1133        Objective: Vitals:   12/19/20 0600 12/19/20 0834 12/19/20 0855 12/19/20 0900  BP: (!) 98/47  (!) 109/40   Pulse: 75  95   Resp: (!) 32     Temp:  97.7 F (36.5 C)    TempSrc:  Oral    SpO2: 96%   96%  Weight: 92.9 kg     Height: 5' 7.99" (1.727 m)       Intake/Output Summary (Last 24 hours) at 12/19/2020 1031 Last data filed at 12/19/2020 0100 Gross per 24 hour  Intake 250 ml  Output 1300 ml  Net -1050 ml    Filed Weights   12/17/20 0936 12/18/20 0615 12/19/20 0600  Weight: 98 kg 95.1 kg 92.9 kg    Examination: General exam: Appears calm and comfortable  Respiratory system: Clear to auscultation. Respiratory effort normal.  On 3 L oxygen Cardiovascular system: S1 & S2 heard, irregular rhythm. No pedal edema. Gastrointestinal system: Abdomen is nondistended, soft and nontender. Normal bowel sounds heard. Central nervous system: Alert and oriented. Non focal exam. Speech clear   Extremities: Symmetric in appearance bilaterally  Skin: No rashes, lesions or ulcers on exposed skin  Psychiatry: Judgement and insight appear stable. Mood & affect appropriate.    Data Reviewed: I have personally reviewed following labs and imaging studies  CBC: Recent Labs  Lab 12/17/20 0939 12/18/20 0938 12/19/20 0244  WBC 14.7* 14.1* 14.7*  HGB 12.4* 13.2 13.4  HCT 37.4* 38.7* 39.4  MCV 91.4 89.6 89.7  PLT 194 193 [id number]    Basic Metabolic Panel: Recent Labs  Lab 12/17/20 0939 12/18/20 0230 12/18/20 0817 12/19/20 0244  NA 144  --  140 139  K 4.3  --  4.3 3.6  CL 113*  --  107 105  CO2 18*  --  22 26  GLUCOSE 139*  --  116* 174*  BUN 20  --  23 28*  CREATININE 0.91  --  0.93 1.17  CALCIUM 9.4  --  9.3 8.8*  MG  --  1.9  --   --   PHOS  --  4.6  --   --     GFR: Estimated Creatinine Clearance: 62.2 mL/min (by C-G formula based on SCr of 1.17 mg/dL). Liver Function Tests: Recent Labs  Lab 12/17/20 1315  AST 29  ALT 33  ALKPHOS 38  BILITOT 0.7  PROT 6.7  ALBUMIN 3.8    No results for input(s): LIPASE, AMYLASE in the last 168 hours. No results for input(s): AMMONIA in the last 168 hours. Coagulation Profile: Recent Labs  Lab 12/17/20 0811 12/17/20 1635 12/18/20 0230 12/19/20 0244  INR 2.2 3.6* 2.2* 1.9*    Cardiac Enzymes: No results for input(s): CKTOTAL, CKMB, CKMBINDEX, TROPONINI in the last 168 hours. BNP (last 3 results) No results for input(s): PROBNP in the last 8760 hours. HbA1C: No results for input(s): HGBA1C in the last 72 hours. CBG: Recent Labs  Lab 12/18/20 1559 12/18/20 1943 12/18/20 2326 12/19/20 0346 12/19/20 0742  GLUCAP 149* 155* 109* 202* 118*    Lipid Profile: No results for input(s): CHOL, HDL, LDLCALC, TRIG, CHOLHDL, LDLDIRECT in the last 72 hours. Thyroid Function Tests: No results for input(s): TSH, T4TOTAL, FREET4, T3FREE, THYROIDAB in the last 72 hours. Anemia Panel: No results for input(s): VITAMINB12,  FOLATE, FERRITIN, TIBC, IRON, RETICCTPCT in the last 72 hours. Sepsis Labs: Recent Labs  Lab 12/17/20 1315 12/17/20 1635 12/18/20 0230  PROCALCITON <0.10  --   --   LATICACIDVEN  --  1.8 1.0     Recent Results (from the past 240 hour(s))  Resp Panel by RT-PCR (Flu A&B, Covid) Nasopharyngeal Swab     Status: None   Collection Time: 12/17/20 11:04 AM   Specimen: Nasopharyngeal Swab; Nasopharyngeal(NP) swabs in vial transport medium  Result Value Ref Range Status   SARS Coronavirus 2 by RT PCR NEGATIVE NEGATIVE Final    Comment: (NOTE) SARS-CoV-2 target nucleic acids are NOT DETECTED.  The SARS-CoV-2 RNA is generally detectable in upper respiratory specimens during the acute phase of infection. The lowest concentration of SARS-CoV-2 viral copies this assay can detect is 138 copies/mL. A negative result does not preclude SARS-Cov-2 infection and should not be used as the sole basis for treatment or other patient management decisions. A negative result may occur with  improper specimen collection/handling, submission of specimen other than nasopharyngeal swab, presence of viral mutation(s) within the areas targeted by this assay, and inadequate number of viral copies(<138 copies/mL). A negative result must be combined with clinical observations, patient history, and epidemiological information. The expected result is Negative.  Fact Sheet for Patients:  EntrepreneurPulse.com.au  Fact Sheet for Healthcare Providers:  IncredibleEmployment.be  This test is no t yet approved or cleared by the [place] FDA and  has been authorized for detection and/or diagnosis of SARS-CoV-2 by FDA under an Emergency Use Authorization (EUA). This EUA will remain  in effect (meaning this test can be used) for the duration of the COVID-19 declaration under Section 564(b)(1) of the Act, 21 U.S.C.section 360bbb-3(b)(1), unless the authorization is terminated  or  revoked sooner.       Influenza A by PCR  NEGATIVE NEGATIVE Final   Influenza B by PCR NEGATIVE NEGATIVE Final    Comment: (NOTE) The Xpert Xpress SARS-CoV-2/FLU/RSV plus assay is intended as an aid in the diagnosis of influenza from Nasopharyngeal swab specimens and should not be used as a sole basis for treatment. Nasal washings and aspirates are unacceptable for Xpert Xpress SARS-CoV-2/FLU/RSV testing.  Fact Sheet for Patients: EntrepreneurPulse.com.au  Fact Sheet for Healthcare Providers: IncredibleEmployment.be  This test is not yet approved or cleared by the [place] FDA and has been authorized for detection and/or diagnosis of SARS-CoV-2 by FDA under an Emergency Use Authorization (EUA). This EUA will remain in effect (meaning this test can be used) for the duration of the COVID-19 declaration under Section 564(b)(1) of the Act, 21 U.S.C. section 360bbb-3(b)(1), unless the authorization is terminated or revoked.  Performed at [facility], [place] [address] [place], [place] [address]   Respiratory (~20 pathogens) panel by PCR     Status: None   Collection Time: 12/17/20  3:13 PM   Specimen: Nasopharyngeal Swab; Respiratory  Result Value Ref Range Status   Adenovirus NOT DETECTED NOT DETECTED Final   Coronavirus 229E NOT DETECTED NOT DETECTED Final    Comment: (NOTE) The Coronavirus on the Respiratory Panel, DOES NOT test for the novel  Coronavirus (2019 nCoV)    Coronavirus HKU1 NOT DETECTED NOT DETECTED Final   Coronavirus NL63 NOT DETECTED NOT DETECTED Final   Coronavirus OC43 NOT DETECTED NOT DETECTED Final   Metapneumovirus NOT DETECTED NOT DETECTED Final   Rhinovirus / Enterovirus NOT DETECTED NOT DETECTED Final   Influenza A NOT DETECTED NOT DETECTED Final   Influenza B NOT DETECTED NOT DETECTED Final   Parainfluenza Virus 1 NOT DETECTED NOT DETECTED Final   Parainfluenza Virus 2 NOT DETECTED NOT  DETECTED Final   Parainfluenza Virus 3 NOT DETECTED NOT DETECTED Final   Parainfluenza Virus 4 NOT DETECTED NOT DETECTED Final   Respiratory Syncytial Virus NOT DETECTED NOT DETECTED Final   Bordetella pertussis NOT DETECTED NOT DETECTED Final   Bordetella Parapertussis NOT DETECTED NOT DETECTED Final   Chlamydophila pneumoniae NOT DETECTED NOT DETECTED Final   Mycoplasma pneumoniae NOT DETECTED NOT DETECTED Final    Comment: Performed at [facility], [place]. [address], [place], [place] [address]  MRSA Next Gen by PCR, Nasal     Status: None   Collection Time: 12/17/20  4:06 PM   Specimen: Nasal Mucosa; Nasal Swab  Result Value Ref Range Status   MRSA by PCR Next Gen NOT DETECTED NOT DETECTED Final    Comment: (NOTE) The GeneXpert MRSA Assay (FDA approved for NASAL specimens only), is one component of a comprehensive MRSA colonization surveillance program. It is not intended to diagnose MRSA infection nor to guide or monitor treatment for MRSA infections. Test performance is not FDA approved in patients less than 33 years old. Performed at [facility], [place] [address], [place],  [address]        Radiology Studies: CT Angio Chest Pulmonary Embolism (PE) W or WO Contrast  Result Date: 12/17/2020 CLINICAL DATA:  Eight day history of chest pain and shortness of breath. EXAM: CT ANGIOGRAPHY CHEST WITH CONTRAST TECHNIQUE: Multidetector CT imaging of the chest was performed using the standard protocol during bolus administration of intravenous contrast. Multiplanar CT image reconstructions and MIPs were obtained to evaluate the vascular anatomy. CONTRAST:  173m OMNIPAQUE IOHEXOL 350 MG/ML SOLN COMPARISON:  Prior PET-CT 02/11/2020 FINDINGS: Cardiovascular: Satisfactory opacification of  the pulmonary arteries to the segmental level. No evidence of pulmonary embolism. Normal heart size. No pericardial effusion. Cardiomegaly with left atrial dilation. Calcification of  the aortic valve and mitral valve annulus. Extensive calcifications of the native coronary arteries. Patient is status post median sternotomy with evidence of multivessel CABG. No evidence of aortic aneurysm. Aortic atherosclerotic calcifications are present. Mediastinum/Nodes: No enlarged mediastinal, hilar, or axillary lymph nodes. Thyroid gland, trachea, and esophagus demonstrate no significant findings. Lungs/Pleura: Mild interlobular septal thickening consistent with trace interstitial edema. Peribronchovascular distribution of patchy ground-glass attenuation airspace opacities in both lower lobes. Small bilateral layering pleural effusions. Upper Abdomen: No acute abnormality. Musculoskeletal: No chest wall abnormality. No acute or significant osseous findings. Healed median sternotomy. Review of the MIP images confirms the above findings. IMPRESSION: 1. Negative for acute pulmonary embolus. 2. Peribronchovascular distribution of patchy airspace opacities in both lower lobes. Differential considerations include multi lobar pneumonia, aspiration (given the clearly dependent distribution), and less likely dependant pulmonary edema. 3. Cardiomegaly including left atrial enlargement and trace interstitial edema. 4. Calcified aortic valve and mitral valve annulus. 5. Coronary and aortic atherosclerotic calcifications. Aortic Atherosclerosis (ICD10-I70.0). Electronically Signed   By: Jacqulynn Cadet M.D.   On: 12/17/2020 14:41   DG CHEST PORT 1 VIEW  Result Date: 12/17/2020 CLINICAL DATA:  Chest pain and shortness of breath for 8 days EXAM: PORTABLE CHEST 1 VIEW COMPARISON:  12/17/2020, 10:18 a.m. FINDINGS: Cardiomegaly status post median sternotomy and CABG. Layering bilateral pleural effusions. The visualized skeletal structures are unremarkable. IMPRESSION: Cardiomegaly with layering bilateral pleural effusions. Electronically Signed   By: Eddie Candle M.D.   On: 12/17/2020 16:14   ECHOCARDIOGRAM  COMPLETE  Result Date: 12/17/2020    ECHOCARDIOGRAM REPORT   Patient Name:   MACALLISTER ARIAN Date of Exam: 12/17/2020 Medical Rec #:  [id number]       Height:       68.0 in Accession #:    [id number]      Weight:       216.0 lb Date of Birth:  [birth year]       BSA:          2.112 m Patient Age:    51 years        BP:           130/47 mmHg Patient Gender: M               HR:           77 bpm. Exam Location:  Inpatient Procedure: 2D Echo, Cardiac Doppler and Color Doppler                     STAT ECHO Reported to: Dr. Oval Linsey on 12/17/2020 3:31:00 AM. Indications:    Dyspnea R06.00  History:        Patient has prior history of Echocardiogram examinations, most                 recent 05/03/2020. CAD and Previous Myocardial Infarction,                 Arrythmias:Atrial Fibrillation; Risk Factors:ETOH, Diabetes,                 Dyslipidemia and Hypertension.  Sonographer:    Bernadene Person RDCS Referring Phys: [place]  Sonographer Comments: Technically difficult study due to poor echo windows. Image acquisition challenging due to patient body habitus. Pt unable to turn on left side. IMPRESSIONS  1.  Left ventricular ejection fraction, by estimation, is 60 to 65%. The left ventricle has normal function. The left ventricle has no regional wall motion abnormalities. Left ventricular diastolic parameters are indeterminate.  2. Right ventricular systolic function is normal. The right ventricular size is normal. There is normal pulmonary artery systolic pressure.  3. Left atrial size was severely dilated.  4. Predominantly posterior mitral annular calcification. The mitral valve is normal in structure. Trivial mitral valve regurgitation. No evidence of mitral stenosis. Moderate mitral annular calcification.  5. Compared with the echo 04/2020, mean aortic valve gradient has increased from 25 mmHg to 33 mmHg. The aortic valve is normal in structure. There is moderate calcification of the aortic valve. There is  moderate thickening of the aortic valve. Aortic valve regurgitation is mild. Moderate aortic valve stenosis. Aortic regurgitation PHT measures 428 msec. Aortic valve mean gradient measures 33.5 mmHg. Aortic valve Vmax measures 3.79 m/s.  6. Aortic dilatation noted. There is mild dilatation of the ascending aorta, measuring 37 mm.  7. The inferior vena cava is dilated in size with >50% respiratory variability, suggesting right atrial pressure of 8 mmHg. FINDINGS  Left Ventricle: Left ventricular ejection fraction, by estimation, is 60 to 65%. The left ventricle has normal function. The left ventricle has no regional wall motion abnormalities. The left ventricular internal cavity size was normal in size. There is  no left ventricular hypertrophy. Left ventricular diastolic parameters are indeterminate. Right Ventricle: The right ventricular size is normal. No increase in right ventricular wall thickness. Right ventricular systolic function is normal. There is normal pulmonary artery systolic pressure. The tricuspid regurgitant velocity is 2.39 m/s, and  with an assumed right atrial pressure of 8 mmHg, the estimated right ventricular systolic pressure is [id number] mmHg. Left Atrium: Left atrial size was severely dilated. Right Atrium: Right atrial size was normal in size. Pericardium: There is no evidence of pericardial effusion. Mitral Valve: Predominantly posterior mitral annular calcification. The mitral valve is normal in structure. Moderate mitral annular calcification. Trivial mitral valve regurgitation. No evidence of mitral valve stenosis. Tricuspid Valve: The tricuspid valve is normal in structure. Tricuspid valve regurgitation is trivial. No evidence of tricuspid stenosis. Aortic Valve: Compared with the echo 04/2020, mean aortic valve gradient has increased from 25 mmHg to 33 mmHg. The aortic valve is normal in structure. There is moderate calcification of the aortic valve. There is moderate thickening of the  aortic valve. Aortic valve regurgitation is mild. Aortic regurgitation PHT measures 428 msec. Moderate aortic stenosis is present. Aortic valve mean gradient measures 33.5 mmHg. Aortic valve peak gradient measures 57.5 mmHg. Aortic valve area, by VTI measures 0.59 cm. Pulmonic Valve: The pulmonic valve was normal in structure. Pulmonic valve regurgitation is not visualized. No evidence of pulmonic stenosis. Aorta: Aortic dilatation noted. There is mild dilatation of the ascending aorta, measuring 37 mm. Venous: The inferior vena cava is dilated in size with greater than 50% respiratory variability, suggesting right atrial pressure of 8 mmHg. IAS/Shunts: No atrial level shunt detected by color flow Doppler.  LEFT VENTRICLE PLAX 2D LVIDd:         4.30 cm LVIDs:         2.40 cm LV PW:         1.40 cm LV IVS:        1.40 cm LVOT diam:     2.20 cm LV SV:         58 LV SV Index:   27 LVOT Area:  3.80 cm  RIGHT VENTRICLE TAPSE (M-mode): 1.5 cm LEFT ATRIUM             Index       RIGHT ATRIUM           Index LA diam:        5.30 cm 2.51 cm/m  RA Area:     19.40 cm LA Vol (A2C):   89.0 ml 42.15 ml/m RA Volume:   51.20 ml  24.25 ml/m LA Vol (A4C):   80.3 ml 38.03 ml/m LA Biplane Vol: 93.9 ml 44.47 ml/m  AORTIC VALVE AV Area (Vmax):    0.67 cm AV Area (Vmean):   0.71 cm AV Area (VTI):     0.59 cm AV Vmax:           379.00 cm/s AV Vmean:          263.500 cm/s AV VTI:            0.979 m AV Peak Grad:      57.5 mmHg AV Mean Grad:      33.5 mmHg LVOT Vmax:         66.75 cm/s LVOT Vmean:        49.100 cm/s LVOT VTI:          0.152 m LVOT/AV VTI ratio: 0.16 AI PHT:            428 msec  AORTA Ao Root diam: 3.60 cm Ao Asc diam:  3.70 cm TRICUSPID VALVE TR Peak grad:   22.8 mmHg TR Vmax:        239.00 cm/s  SHUNTS Systemic VTI:  0.15 m Systemic Diam: 2.20 cm Skeet Latch MD Electronically signed by Skeet Latch MD Signature Date/Time: 12/17/2020/3:48:13 PM    Final       Scheduled Meds:  buPROPion ER  200 mg  Oral q morning   And   buPROPion ER  100 mg Oral QHS   chlorhexidine  15 mL Mouth Rinse BID   Chlorhexidine Gluconate Cloth  6 each Topical Daily   ezetimibe  10 mg Oral Daily   furosemide  40 mg Intravenous BID   gabapentin  300 mg Oral QHS   insulin aspart  0-9 Units Subcutaneous TID WC   latanoprost  1 drop Left Eye QODAY   mouth rinse  15 mL Mouth Rinse q12n4p   metoprolol  25 mg Oral BID   rosuvastatin  20 mg Oral Daily   vitamin B-12  500 mcg Oral QODAY   warfarin  4 mg Oral ONCE-1600   Warfarin - Pharmacist Dosing Inpatient   Does not apply q1600   Continuous Infusions:   LOS: 2 days      Time spent: 25 minutes   Dessa Phi, DO [facility] 12/19/2020, 10:31 AM   Available via Epic secure chat 7am-7pm After these hours, please refer to coverage provider listed on [email or web address]

## 2020-12-19 NOTE — Evaluation (Signed)
Physical Therapy Evaluation Patient Details Name: Rodney Friedman MRN: [id number] DOB: 1945-08-18 Today's Date: 12/19/2020   History of Present Illness  75 yo male admitted with acute respiratory failure, COPD. Hx of CAD, CABG, aortic stenosis, DM, CHF, COPD, DM, CLL  Clinical Impression  On eval, pt was MIn guard assist for mobility. He walked ~100 feet. Unsteady at times but no overt LOB. O2 93% on RA, dyspnea 2/4. Pt reported fatigue, dyspnea, lightheadedness after short walk. BP 124/72. Discussed d/c plan-pt plans to return home where he lives alone. Will recommend HHPT f/u.     Follow Up Recommendations Home health PT    Equipment Recommendations  None recommended by PT    Recommendations for Other Services       Precautions / Restrictions Precautions Precautions: Fall Restrictions Weight Bearing Restrictions: No      Mobility  Bed Mobility Overal bed mobility: Modified Independent                  Transfers Overall transfer level: Needs assistance   Transfers: Sit to/from Stand Sit to Stand: Supervision         General transfer comment: Supv for safety.  Ambulation/Gait Ambulation/Gait assistance: Min guard Gait Distance (Feet): 100 Feet Assistive device:  (int use of hallway handrail) Gait Pattern/deviations: Step-through pattern;Decreased stride length     General Gait Details: Unsteady but no overt LOB. O2 93% on RA, dyspnea 2/4. Pt c/o some lightheadedness.  Stairs            Wheelchair Mobility    Modified Rankin (Stroke Patients Only)       Balance Overall balance assessment: Needs assistance         Standing balance support: During functional activity Standing balance-Leahy Scale: Fair                               Pertinent Vitals/Pain Pain Assessment: No/denies pain    Home Living Family/patient expects to be discharged to:: Private residence Living Arrangements: Alone   Type of Home: House Home  Access: Stairs to enter   Technical brewer of Steps: 4 Home Layout: One level Home Equipment: None      Prior Function Level of Independence: Independent               Hand Dominance        Extremity/Trunk Assessment   Upper Extremity Assessment Upper Extremity Assessment: Overall WFL for tasks assessed    Lower Extremity Assessment Lower Extremity Assessment: Generalized weakness    Cervical / Trunk Assessment Cervical / Trunk Assessment: Normal  Communication   Communication: No difficulties  Cognition Arousal/Alertness: Awake/alert Behavior During Therapy: WFL for tasks assessed/performed Overall Cognitive Status: Within Functional Limits for tasks assessed                                        General Comments      Exercises     Assessment/Plan    PT Assessment Patient needs continued PT services  PT Problem List Decreased mobility;Decreased strength;Decreased activity tolerance;Decreased balance       PT Treatment Interventions DME instruction;Gait training;Therapeutic exercise;Balance training;Functional mobility training;Patient/family education;Therapeutic activities    PT Goals (Current goals can be found in the Care Plan section)  Acute Rehab PT Goals Patient Stated Goal: regain PLOF PT Goal Formulation: With patient Time  For Goal Achievement: 01/02/21 Potential to Achieve Goals: Good    Frequency Min 3X/week   Barriers to discharge        Co-evaluation               AM-PAC PT "6 Clicks" Mobility  Outcome Measure Help needed turning from your back to your side while in a flat bed without using bedrails?: None Help needed moving from lying on your back to sitting on the side of a flat bed without using bedrails?: None Help needed moving to and from a bed to a chair (including a wheelchair)?: A Little Help needed standing up from a chair using your arms (e.g., wheelchair or bedside chair)?: A Little Help  needed to walk in hospital room?: A Little Help needed climbing 3-5 steps with a railing? : A Little 6 Click Score: 20    End of Session Equipment Utilized During Treatment: Gait belt Activity Tolerance: Patient tolerated treatment well Patient left: in chair;with call bell/phone within reach;with chair alarm set   PT Visit Diagnosis: Muscle weakness (generalized) (M62.81);Difficulty in walking, not elsewhere classified (R26.2)    Time: [id number] PT Time Calculation (min) (ACUTE ONLY): 14 min   Charges:   PT Evaluation $PT Eval Moderate Complexity: [place], PT Acute Rehabilitation  Office: [phone] Pager: [phone]

## 2020-12-19 NOTE — Progress Notes (Signed)
[place] for warfarin Indication: hx atrial fibrillation  Allergies  Allergen Reactions   Other Rash    Allergen: "Plants and bushes while doing yard work"    Patient Measurements: Height: 5' 7.99" (172.7 cm) Weight: 92.9 kg (204 lb 12.9 oz) IBW/kg (Calculated) : 68.38 Heparin Dosing Weight:   Vital Signs: Temp: 97.7 F (36.5 C) (07/31 0834) Temp Source: Oral (07/31 0834) BP: 109/40 (07/31 0855) Pulse Rate: 95 (07/31 0855)  Labs: Recent Labs    12/17/20 0939 12/17/20 1139 12/17/20 1315 12/17/20 1635 12/18/20 0230 12/18/20 0511 12/18/20 0817 12/18/20 0938 12/19/20 0244  HGB 12.4*  --   --   --   --   --   --  13.2 13.4  HCT 37.4*  --   --   --   --   --   --  38.7* 39.4  PLT 194  --   --   --   --   --   --  193 202  LABPROT  --   --   --  36.2* 24.3*  --   --   --  21.6*  INR  --   --   --  3.6* 2.2*  --   --   --  1.9*  CREATININE 0.91  --   --   --   --   --  0.93  --  1.17  TROPONINIHS 14   < > 13 13  --  18*  --   --   --    < > = values in this interval not displayed.     Estimated Creatinine Clearance: 62.2 mL/min (by C-G formula based on SCr of 1.17 mg/dL).   Medications:  PTA warfarin regimen: '4mg'$  daily except '2mg'$  on Tue and Fri per [facility] clinic's note on 7/29  Assessment: Patient is a 75 y.o M with hx CAD (s/p CABG) and chronic afib on warfarin PTA presented to the ED on 7/29 with c/o CP.   Today, 12/19/2020: INR just below therapeutic range cbc good no reported bleeding  Goal of Therapy:  INR 2-3 Monitor platelets by anticoagulation protocol: Yes   Plan:  Repeat warfarin '4mg'$  daily INR.  monitor for s/sx    Kara Mead 12/19/2020,9:57 AM

## 2020-12-20 ENCOUNTER — Telehealth: Payer: Self-pay | Admitting: Cardiovascular Disease

## 2020-12-20 DIAGNOSIS — J9601 Acute respiratory failure with hypoxia: Secondary | ICD-10-CM | POA: Diagnosis not present

## 2020-12-20 LAB — BASIC METABOLIC PANEL
Anion gap: 8 (ref 5–15)
BUN: 24 mg/dL — ABNORMAL HIGH (ref 8–23)
CO2: 26 mmol/L (ref 22–32)
Calcium: 9 mg/dL (ref 8.9–10.3)
Chloride: 106 mmol/L (ref 98–111)
Creatinine, Ser: 0.91 mg/dL (ref 0.61–1.24)
GFR, Estimated: >60 mL/min (ref >60)
Glucose, Bld: 98 mg/dL (ref 70–99)
Potassium: 3.6 mmol/L (ref 3.5–5.1)
Sodium: 140 mmol/L (ref 135–145)

## 2020-12-20 LAB — LEGIONELLA PNEUMOPHILA SEROGP 1 UR AG: L. pneumophila Serogp 1 Ur Ag: NEGATIVE

## 2020-12-20 LAB — CBC
HCT: 41.9 % (ref 39.0–52.0)
Hemoglobin: 14.3 g/dL (ref 13.0–17.0)
MCH: 30.4 pg (ref 26.0–34.0)
MCHC: 34.1 g/dL (ref 30.0–36.0)
MCV: 89.1 fL (ref 80.0–100.0)
Platelets: 197 10*3/uL (ref 150–400)
RBC: 4.7 MIL/uL (ref 4.22–5.81)
RDW: 13.9 % (ref 11.5–15.5)
WBC: 16.3 10*3/uL — ABNORMAL HIGH (ref 4.0–10.5)
nRBC: 0 % (ref 0.0–0.2)

## 2020-12-20 LAB — PROTIME-INR
INR: 2.1 — ABNORMAL HIGH (ref 0.8–1.2)
Prothrombin Time: 23.8 seconds — ABNORMAL HIGH (ref 11.4–15.2)

## 2020-12-20 LAB — GLUCOSE, CAPILLARY: Glucose-Capillary: 119 mg/dL — ABNORMAL HIGH (ref 70–99)

## 2020-12-20 LAB — HEMOGLOBIN A1C
Hgb A1c MFr Bld: 6.1 % — ABNORMAL HIGH (ref 4.8–5.6)
Mean Plasma Glucose: 128 mg/dL

## 2020-12-20 MED ORDER — FUROSEMIDE 20 MG PO TABS: 20.0000 mg | ORAL_TABLET | Freq: Every day | ORAL | 0 refills | Status: DC

## 2020-12-20 MED ORDER — WARFARIN SODIUM 4 MG PO TABS
4.0000 mg | ORAL_TABLET | Freq: Once | ORAL | Status: DC
  Filled 2020-12-20: qty 1

## 2020-12-20 MED ORDER — METOPROLOL SUCCINATE ER 50 MG PO TB24: 50.0000 mg | ORAL_TABLET | Freq: Two times a day (BID) | ORAL | 0 refills | Status: DC

## 2020-12-20 NOTE — TOC Transition Note (Addendum)
Transition of Care [facility]) - CM/SW Discharge Note   Patient Details  Name: Rodney Friedman MRN: [id number] Date of Birth: 09-Nov-1945  [facility] CM/SW Contact:  Trish Mage, LCSW Phone Number: 12/20/2020, 1:17 PM   Clinical Narrative:   Patient who is stable for d/c will return home today, has a ride, states he is open to referral for [facility] PT if that is recommended, no agency preference.  He lives by himself, has all needed DME that his father used before he passed, though he uses none.  [employer] agency not identified at time of this writing.  Will update when confirmed. No further needs identified.  Cindie with [facility] agrees to service patient with [facility] needs.     Final next level of care: [place] Barriers to Discharge: No Barriers Identified   Patient Goals and CMS Choice        Discharge Placement                       Discharge Plan and Services                                     Social Determinants of Health (SDOH) Interventions     Readmission Risk Interventions No flowsheet data found.

## 2020-12-20 NOTE — Telephone Encounter (Signed)
New message:     Patient would like to let Dr. Sallyanne Kuster know that he is in [facility]ney Friedman in room 1518. [place] Patient thinks he might have had a heart attack, he has been in the hospital 4 days. Patient would like for the nurse to call him in his room his cell phone doesn't work to well. Please advise.

## 2020-12-20 NOTE — Telephone Encounter (Signed)
Left a message on the patient's voicemail that the message was received and we would let Dr. Sallyanne Kuster know.

## 2020-12-20 NOTE — Progress Notes (Signed)
[place] for warfarin Indication: hx atrial fibrillation  Allergies  Allergen Reactions   Other Rash    Allergen: "Plants and bushes while doing yard work"    Patient Measurements: Height: 5' 7.99" (172.7 cm) Weight: 90.3 kg (199 lb 1.2 oz) IBW/kg (Calculated) : 68.38 Heparin Dosing Weight:   Vital Signs: Temp: 98.2 F (36.8 C) (08/01 0502) Temp Source: Axillary (08/01 0502) BP: 115/52 (08/01 0502) Pulse Rate: 80 (08/01 0502)  Labs: Recent Labs    12/17/20 1315 12/17/20 1635 12/17/20 1635 12/18/20 0230 12/18/20 0511 12/18/20 0817 12/18/20 0938 12/19/20 0244 12/20/20 0459  HGB  --   --   --   --   --   --  13.2 13.4 14.3  HCT  --   --   --   --   --   --  38.7* 39.4 41.9  PLT  --   --   --   --   --   --  193 202 197  LABPROT  --  36.2*   < > 24.3*  --   --   --  21.6* 23.8*  INR  --  3.6*   < > 2.2*  --   --   --  1.9* 2.1*  CREATININE  --   --   --   --   --  0.93  --  1.17 0.91  TROPONINIHS 13 13  --   --  18*  --   --   --   --    < > = values in this interval not displayed.     Estimated Creatinine Clearance: 78.9 mL/min (by C-G formula based on SCr of 0.91 mg/dL).   Medications:  PTA warfarin regimen: '4mg'$  daily except '2mg'$  on Tue and Fri per [facility] clinic's note on 7/29  Assessment: Patient is a 75 y.o M with hx CAD (s/p CABG) and chronic afib on warfarin PTA presented to the ED on 7/29 with c/o CP.   Today, 12/20/2020: INR is 2.1, therapeutic  cbc stable  no reported bleeding Meals charted as 100% eaten   Goal of Therapy:  INR 2-3 Monitor platelets by anticoagulation protocol: Yes   Plan:  Repeat warfarin '4mg'$  daily INR.  monitor for s/sx     Royetta Asal, PharmD, BCPS 12/20/2020 8:41 AM

## 2020-12-20 NOTE — Discharge Summary (Signed)
Physician Discharge Summary  Rodney Friedman [id number] DOB: February 28, 1946 DOA: 12/17/2020  PCP: Kristie Cowman, MD  Admit date: 12/17/2020 Discharge date: 12/20/2020  Admitted From: Home Disposition:  Home  Recommendations for Outpatient Follow-up:  Follow up with PCP in 1 week Follow up with Cardiology   Discharge Condition: Stable CODE STATUS: DNR  Diet recommendation: Heart healthy, low sodium, fluid restriction   Brief/Interim Summary: Rodney Friedman is a 75 y.o. male with history of CAD status post CABG, moderate aortic stenosis with grade 1 diastolic dysfunction per 2D echo done in December 2021, COPD, ongoing tobacco abuse, A. fib, diabetes mellitus type 2 last hemoglobin A1c was 6.7 in April 26, 2020 presents to the ER with complaint of shortness of breath.  Patient states over the last 1 week patient has been getting increasingly short of breath mostly on exertion.  Has some nonproductive cough denies any chest pain.  Denies any orthopnea.  Has been compliant with his medications including Coumadin.  Chest x-ray showing left-sided infiltrates and pleural effusion concerning for pneumonia and possible CHF.  Patient was initially started on antibiotics for pneumonia.  Patient went to the bathroom and came back when he became very short of breath and had to be placed on nonrebreather and eventually BiPAP. PCCM was consulted.  Patient was diagnosed with acute diastolic heart failure.  Low suspicion for infection and antibiotic was discontinued.  Patient was diuresed with IV Lasix with improvement in his respiratory failure.  He was weaned off BiPAP and subsequently off oxygen.  Discharge Diagnoses:  Principal Problem:   Acute respiratory failure with hypoxia (HCC) Active Problems:   CLL (chronic lymphocytic leukemia) (HCC)   Prostate cancer (HCC)   Coronary artery disease of native artery of native heart with stable angina pectoris (HCC)   Chronic atrial fibrillation (HCC)   Aortic  valve stenosis, nonrheumatic   Paroxysmal atrial fibrillation (HCC)   CAP (community acquired pneumonia)   Acute hypoxemic respiratory failure secondary to acute diastolic heart failure exacerbation -BNP 362.3 -Appreciate PCCM -Echocardiogram revealed EF 60 to 65%, Moderate aortic valve stenosis -Diuresed well with IV Lasix, discharged on p.o. -Strict I's and O's, fluid restriction, daily weight -Doubt pneumonia, stop antibiotics and monitor -Respiratory failure resolved and weaned to room air  CAD status post CABG x3 -Continue Crestor   Chronic A. fib -Continue Coumadin, Toprol   Hypertension -Continue Toprol, decreased dose.  Hold Norvasc and ARB due to marginal blood pressure this hospitalization  Diabetes mellitus type 2 -Hemoglobin A1c 6.6 -Resume metformin   History of CLL in remission -Chronically elevated WBC  Mood disorder -Continue Wellbutrin  Discharge Instructions  Discharge Instructions     (HEART FAILURE PATIENTS) Call MD:  Anytime you have any of the following symptoms: 1) 3 pound weight gain in 24 hours or 5 pounds in 1 week 2) shortness of breath, with or without a dry hacking cough 3) swelling in the hands, feet or stomach 4) if you have to sleep on extra pillows at night in order to breathe.   Complete by: As directed    Call MD for:  difficulty breathing, headache or visual disturbances   Complete by: As directed    Call MD for:  extreme fatigue   Complete by: As directed    Call MD for:  persistant dizziness or light-headedness   Complete by: As directed    Call MD for:  persistant nausea and vomiting   Complete by: As directed    Call MD for:  severe uncontrolled pain   Complete by: As directed    Call MD for:  temperature >100.4   Complete by: As directed    Diet - low sodium heart healthy   Complete by: As directed    Discharge instructions   Complete by: As directed    You were cared for by a hospitalist during your hospital stay. If you  have any questions about your discharge medications or the care you received while you were in the hospital after you are discharged, you can call the unit and ask to speak with the hospitalist on call if the hospitalist that took care of you is not available. Once you are discharged, your primary care physician will handle any further medical issues. Please note that NO REFILLS for any discharge medications will be authorized once you are discharged, as it is imperative that you return to your primary care physician (or establish a relationship with a primary care physician if you do not have one) for your aftercare needs so that they can reassess your need for medications and monitor your lab values.   Increase activity slowly   Complete by: As directed       Allergies as of 12/20/2020       Reactions   Other Rash   Allergen: "Plants and bushes while doing yard work"        Medication List     STOP taking these medications    amLODipine 10 MG tablet Commonly known as: NORVASC   valsartan 320 MG tablet Commonly known as: DIOVAN       TAKE these medications    BIOTIN PO Take 1 tablet by mouth at bedtime.   buPROPion ER 100 MG 12 hr tablet Commonly known as: Wellbutrin SR 2  qam   1  q diner What changed:  how much to take how to take this when to take this additional instructions   CALCIUM 1200 PO Take 1,200 mg by mouth daily.   docusate sodium 100 MG capsule Commonly known as: COLACE Take 100 mg by mouth.   ezetimibe 10 MG tablet Commonly known as: ZETIA Take 1 tablet (10 mg total) by mouth daily. What changed: when to take this   Fish Oil 1000 MG Caps Take 1,000 mg by mouth every other day.   furosemide 20 MG tablet Commonly known as: Lasix Take 1 tablet (20 mg total) by mouth daily.   gabapentin 100 MG capsule Commonly known as: NEURONTIN 3  qhs What changed:  how much to take how to take this when to take this   ketoconazole 2 %  shampoo Commonly known as: NIZORAL Apply 1 application topically 2 (two) times a week.   latanoprost 0.005 % ophthalmic solution Commonly known as: XALATAN Place 1 drop into the left eye every other day.   LYCOPENE PO Take 10 mg by mouth every other day.   Magnesium 250 MG Tabs Take 250 mg by mouth every 3 (three) days.   metFORMIN 500 MG tablet Commonly known as: GLUCOPHAGE Take 1 tablet (500 mg total) by mouth 2 (two) times daily with a meal.   metoprolol succinate 50 MG 24 hr tablet Commonly known as: TOPROL-XL Take 1 tablet (50 mg total) by mouth 2 (two) times daily. Take with or immediately following a meal. What changed:  medication strength how much to take   PREVIDENT 5000 DRY MOUTH DT Place 1 application onto teeth See admin instructions. Apply to gums every night for dry  mouth   rosuvastatin 20 MG tablet Commonly known as: Crestor Take 1 tablet (20 mg total) by mouth daily. What changed: when to take this   vitamin B-12 500 MCG tablet Commonly known as: CYANOCOBALAMIN Take 500 mcg by mouth every other day.   vitamin C 500 MG tablet Commonly known as: ASCORBIC ACID Take 500 mg by mouth See admin instructions. Take 1 tablet every other day alternating with '1000mg'$    vitamin C 1000 MG tablet Take 1,000 mg by mouth See admin instructions. Take 1 tablet  every other day alternating with '500mg'$  tablet   Vitamin D (Ergocalciferol) 1.25 MG (50000 UNIT) Caps capsule Commonly known as: DRISDOL Take 50,000 Units by mouth every Saturday.   VITAMIN-B COMPLEX PO Take 1 tablet by mouth at bedtime.   warfarin 4 MG tablet Commonly known as: COUMADIN Take as directed. If you are unsure how to take this medication, talk to your nurse or doctor. Original instructions: TAKE 1/2 TO 1 TABLET DAILY AS DIRECTED BY COUMADIN CLINIC What changed: See the new instructions.        Follow-up Information     Kristie Cowman, MD. Schedule an appointment as soon as possible for a  visit in 1 week(s).   Specialty: Family Medicine Contact information: [place] [address] [phone]         Sanda Klein, MD Follow up.   Specialty: Cardiology Contact information: [address] [place] [address] [phone]                Allergies  Allergen Reactions   Other Rash    Allergen: "Plants and bushes while doing yard work"    Consultations: PCCM   Procedures/Studies: DG Chest 2 View  Result Date: 12/17/2020 CLINICAL DATA:  chest pain and SOB EXAM: CHEST - 2 VIEW COMPARISON:  06/04/2014. FINDINGS: Enlarged cardiac silhouette, similar to prior. Median sternotomy. Small right pleural effusion. Possible small left pleural effusion. Patchy left basilar airspace opacities. No visible pleural effusions or pneumothorax. IMPRESSION: 1. Patchy left basilar airspace opacities, suspicious for pneumonia. 2. Cardiomegaly.  Small right (and possibly left) pleural effusions. Electronically Signed   By: Margaretha Sheffield MD   On: 12/17/2020 10:54   CT Angio Chest Pulmonary Embolism (PE) W or WO Contrast  Result Date: 12/17/2020 CLINICAL DATA:  Eight day history of chest pain and shortness of breath. EXAM: CT ANGIOGRAPHY CHEST WITH CONTRAST TECHNIQUE: Multidetector CT imaging of the chest was performed using the standard protocol during bolus administration of intravenous contrast. Multiplanar CT image reconstructions and MIPs were obtained to evaluate the vascular anatomy. CONTRAST:  12m OMNIPAQUE IOHEXOL 350 MG/ML SOLN COMPARISON:  Prior PET-CT 02/11/2020 FINDINGS: Cardiovascular: Satisfactory opacification of the pulmonary arteries to the segmental level. No evidence of pulmonary embolism. Normal heart size. No pericardial effusion. Cardiomegaly with left atrial dilation. Calcification of the aortic valve and mitral valve annulus. Extensive calcifications of the native coronary arteries. Patient is status post median sternotomy with  evidence of multivessel CABG. No evidence of aortic aneurysm. Aortic atherosclerotic calcifications are present. Mediastinum/Nodes: No enlarged mediastinal, hilar, or axillary lymph nodes. Thyroid gland, trachea, and esophagus demonstrate no significant findings. Lungs/Pleura: Mild interlobular septal thickening consistent with trace interstitial edema. Peribronchovascular distribution of patchy ground-glass attenuation airspace opacities in both lower lobes. Small bilateral layering pleural effusions. Upper Abdomen: No acute abnormality. Musculoskeletal: No chest wall abnormality. No acute or significant osseous findings. Healed median sternotomy. Review of the MIP images confirms the above findings.  IMPRESSION: 1. Negative for acute pulmonary embolus. 2. Peribronchovascular distribution of patchy airspace opacities in both lower lobes. Differential considerations include multi lobar pneumonia, aspiration (given the clearly dependent distribution), and less likely dependant pulmonary edema. 3. Cardiomegaly including left atrial enlargement and trace interstitial edema. 4. Calcified aortic valve and mitral valve annulus. 5. Coronary and aortic atherosclerotic calcifications. Aortic Atherosclerosis (ICD10-I70.0). Electronically Signed   By: Jacqulynn Cadet M.D.   On: 12/17/2020 14:41   DG CHEST PORT 1 VIEW  Result Date: 12/17/2020 CLINICAL DATA:  Chest pain and shortness of breath for 8 days EXAM: PORTABLE CHEST 1 VIEW COMPARISON:  12/17/2020, 10:18 a.m. FINDINGS: Cardiomegaly status post median sternotomy and CABG. Layering bilateral pleural effusions. The visualized skeletal structures are unremarkable. IMPRESSION: Cardiomegaly with layering bilateral pleural effusions. Electronically Signed   By: Eddie Candle M.D.   On: 12/17/2020 16:14   ECHOCARDIOGRAM COMPLETE  Result Date: 12/17/2020    ECHOCARDIOGRAM REPORT   Patient Name:   Rodney Friedman Date of Exam: 12/17/2020 Medical Rec #:  [id number]        Height:       68.0 in Accession #:    [id number]      Weight:       216.0 lb Date of Birth:  May 09, 1946       BSA:          2.112 m Patient Age:    66 years        BP:           130/47 mmHg Patient Gender: M               HR:           77 bpm. Exam Location:  Inpatient Procedure: 2D Echo, Cardiac Doppler and Color Doppler                     STAT ECHO Reported to: Dr. Oval Linsey on 12/17/2020 3:31:00 AM. Indications:    Dyspnea R06.00  History:        Patient has prior history of Echocardiogram examinations, most                 recent 05/03/2020. CAD and Previous Myocardial Infarction,                 Arrythmias:Atrial Fibrillation; Risk Factors:ETOH, Diabetes,                 Dyslipidemia and Hypertension.  Sonographer:    Bernadene Person RDCS Referring Phys: [place]  Sonographer Comments: Technically difficult study due to poor echo windows. Image acquisition challenging due to patient body habitus. Pt unable to turn on left side. IMPRESSIONS  1. Left ventricular ejection fraction, by estimation, is 60 to 65%. The left ventricle has normal function. The left ventricle has no regional wall motion abnormalities. Left ventricular diastolic parameters are indeterminate.  2. Right ventricular systolic function is normal. The right ventricular size is normal. There is normal pulmonary artery systolic pressure.  3. Left atrial size was severely dilated.  4. Predominantly posterior mitral annular calcification. The mitral valve is normal in structure. Trivial mitral valve regurgitation. No evidence of mitral stenosis. Moderate mitral annular calcification.  5. Compared with the echo 04/2020, mean aortic valve gradient has increased from 25 mmHg to 33 mmHg. The aortic valve is normal in structure. There is moderate calcification of the aortic valve. There is moderate thickening of the aortic valve. Aortic valve regurgitation is mild.  Moderate aortic valve stenosis. Aortic regurgitation PHT measures 428 msec.  Aortic valve mean gradient measures 33.5 mmHg. Aortic valve Vmax measures 3.79 m/s.  6. Aortic dilatation noted. There is mild dilatation of the ascending aorta, measuring 37 mm.  7. The inferior vena cava is dilated in size with >50% respiratory variability, suggesting right atrial pressure of 8 mmHg. FINDINGS  Left Ventricle: Left ventricular ejection fraction, by estimation, is 60 to 65%. The left ventricle has normal function. The left ventricle has no regional wall motion abnormalities. The left ventricular internal cavity size was normal in size. There is  no left ventricular hypertrophy. Left ventricular diastolic parameters are indeterminate. Right Ventricle: The right ventricular size is normal. No increase in right ventricular wall thickness. Right ventricular systolic function is normal. There is normal pulmonary artery systolic pressure. The tricuspid regurgitant velocity is 2.39 m/s, and  with an assumed right atrial pressure of 8 mmHg, the estimated right ventricular systolic pressure is [id number] mmHg. Left Atrium: Left atrial size was severely dilated. Right Atrium: Right atrial size was normal in size. Pericardium: There is no evidence of pericardial effusion. Mitral Valve: Predominantly posterior mitral annular calcification. The mitral valve is normal in structure. Moderate mitral annular calcification. Trivial mitral valve regurgitation. No evidence of mitral valve stenosis. Tricuspid Valve: The tricuspid valve is normal in structure. Tricuspid valve regurgitation is trivial. No evidence of tricuspid stenosis. Aortic Valve: Compared with the echo 04/2020, mean aortic valve gradient has increased from 25 mmHg to 33 mmHg. The aortic valve is normal in structure. There is moderate calcification of the aortic valve. There is moderate thickening of the aortic valve. Aortic valve regurgitation is mild. Aortic regurgitation PHT measures 428 msec. Moderate aortic stenosis is present. Aortic valve mean  gradient measures 33.5 mmHg. Aortic valve peak gradient measures 57.5 mmHg. Aortic valve area, by VTI measures 0.59 cm. Pulmonic Valve: The pulmonic valve was normal in structure. Pulmonic valve regurgitation is not visualized. No evidence of pulmonic stenosis. Aorta: Aortic dilatation noted. There is mild dilatation of the ascending aorta, measuring 37 mm. Venous: The inferior vena cava is dilated in size with greater than 50% respiratory variability, suggesting right atrial pressure of 8 mmHg. IAS/Shunts: No atrial level shunt detected by color flow Doppler.  LEFT VENTRICLE PLAX 2D LVIDd:         4.30 cm LVIDs:         2.40 cm LV PW:         1.40 cm LV IVS:        1.40 cm LVOT diam:     2.20 cm LV SV:         58 LV SV Index:   27 LVOT Area:     3.80 cm  RIGHT VENTRICLE TAPSE (M-mode): 1.5 cm LEFT ATRIUM             Index       RIGHT ATRIUM           Index LA diam:        5.30 cm 2.51 cm/m  RA Area:     19.40 cm LA Vol (A2C):   89.0 ml 42.15 ml/m RA Volume:   51.20 ml  24.25 ml/m LA Vol (A4C):   80.3 ml 38.03 ml/m LA Biplane Vol: 93.9 ml 44.47 ml/m  AORTIC VALVE AV Area (Vmax):    0.67 cm AV Area (Vmean):   0.71 cm AV Area (VTI):     0.59 cm AV Vmax:  379.00 cm/s AV Vmean:          263.500 cm/s AV VTI:            0.979 m AV Peak Grad:      57.5 mmHg AV Mean Grad:      33.5 mmHg LVOT Vmax:         66.75 cm/s LVOT Vmean:        49.100 cm/s LVOT VTI:          0.152 m LVOT/AV VTI ratio: 0.16 AI PHT:            428 msec  AORTA Ao Root diam: 3.60 cm Ao Asc diam:  3.70 cm TRICUSPID VALVE TR Peak grad:   22.8 mmHg TR Vmax:        239.00 cm/s  SHUNTS Systemic VTI:  0.15 m Systemic Diam: 2.20 cm Skeet Latch MD Electronically signed by Skeet Latch MD Signature Date/Time: 12/17/2020/3:48:13 PM    Final       Discharge Exam: Vitals:   12/20/20 0502 12/20/20 0941  BP: (!) 115/52 (!) 113/51  Pulse: 80 88  Resp: 20 16  Temp: 98.2 F (36.8 C) 97.6 F (36.4 C)  SpO2: 93% 98%    General:  Pt is alert, awake, not in acute distress Cardiovascular: Irregular rhythm, S1/S2 +, no edema Respiratory: CTA bilaterally, no wheezing, no rhonchi, no respiratory distress, no conversational dyspnea, on room air Abdominal: Soft, NT, ND, bowel sounds + Extremities: no edema, no cyanosis Psych: Normal mood and affect, stable judgement and insight     The results of significant diagnostics from this hospitalization (including imaging, microbiology, ancillary and laboratory) are listed below for reference.     Microbiology: Recent Results (from the past 240 hour(s))  Resp Panel by RT-PCR (Flu A&B, Covid) Nasopharyngeal Swab     Status: None   Collection Time: 12/17/20 11:04 AM   Specimen: Nasopharyngeal Swab; Nasopharyngeal(NP) swabs in vial transport medium  Result Value Ref Range Status   SARS Coronavirus 2 by RT PCR NEGATIVE NEGATIVE Final    Comment: (NOTE) SARS-CoV-2 target nucleic acids are NOT DETECTED.  The SARS-CoV-2 RNA is generally detectable in upper respiratory specimens during the acute phase of infection. The lowest concentration of SARS-CoV-2 viral copies this assay can detect is 138 copies/mL. A negative result does not preclude SARS-Cov-2 infection and should not be used as the sole basis for treatment or other patient management decisions. A negative result may occur with  improper specimen collection/handling, submission of specimen other than nasopharyngeal swab, presence of viral mutation(s) within the areas targeted by this assay, and inadequate number of viral copies(<138 copies/mL). A negative result must be combined with clinical observations, patient history, and epidemiological information. The expected result is Negative.  Fact Sheet for Patients:  EntrepreneurPulse.com.au  Fact Sheet for Healthcare Providers:  IncredibleEmployment.be  This test is no t yet approved or cleared by the [place] FDA and  has been  authorized for detection and/or diagnosis of SARS-CoV-2 by FDA under an Emergency Use Authorization (EUA). This EUA will remain  in effect (meaning this test can be used) for the duration of the COVID-19 declaration under Section 564(b)(1) of the Act, 21 U.S.C.section 360bbb-3(b)(1), unless the authorization is terminated  or revoked sooner.       Influenza A by PCR NEGATIVE NEGATIVE Final   Influenza B by PCR NEGATIVE NEGATIVE Final    Comment: (NOTE) The Xpert Xpress SARS-CoV-2/FLU/RSV plus assay is intended as an aid in  the diagnosis of influenza from Nasopharyngeal swab specimens and should not be used as a sole basis for treatment. Nasal washings and aspirates are unacceptable for Xpert Xpress SARS-CoV-2/FLU/RSV testing.  Fact Sheet for Patients: EntrepreneurPulse.com.au  Fact Sheet for Healthcare Providers: IncredibleEmployment.be  This test is not yet approved or cleared by the [place] FDA and has been authorized for detection and/or diagnosis of SARS-CoV-2 by FDA under an Emergency Use Authorization (EUA). This EUA will remain in effect (meaning this test can be used) for the duration of the COVID-19 declaration under Section 564(b)(1) of the Act, 21 U.S.C. section 360bbb-3(b)(1), unless the authorization is terminated or revoked.  Performed at [facility], [place] [address], [place], [place] [address]   Respiratory (~20 pathogens) panel by PCR     Status: None   Collection Time: 12/17/20  3:13 PM   Specimen: Nasopharyngeal Swab; Respiratory  Result Value Ref Range Status   Adenovirus NOT DETECTED NOT DETECTED Final   Coronavirus 229E NOT DETECTED NOT DETECTED Final    Comment: (NOTE) The Coronavirus on the Respiratory Panel, DOES NOT test for the novel  Coronavirus (2019 nCoV)    Coronavirus HKU1 NOT DETECTED NOT DETECTED Final   Coronavirus NL63 NOT DETECTED NOT DETECTED Final   Coronavirus OC43 NOT  DETECTED NOT DETECTED Final   Metapneumovirus NOT DETECTED NOT DETECTED Final   Rhinovirus / Enterovirus NOT DETECTED NOT DETECTED Final   Influenza A NOT DETECTED NOT DETECTED Final   Influenza B NOT DETECTED NOT DETECTED Final   Parainfluenza Virus 1 NOT DETECTED NOT DETECTED Final   Parainfluenza Virus 2 NOT DETECTED NOT DETECTED Final   Parainfluenza Virus 3 NOT DETECTED NOT DETECTED Final   Parainfluenza Virus 4 NOT DETECTED NOT DETECTED Final   Respiratory Syncytial Virus NOT DETECTED NOT DETECTED Final   Bordetella pertussis NOT DETECTED NOT DETECTED Final   Bordetella Parapertussis NOT DETECTED NOT DETECTED Final   Chlamydophila pneumoniae NOT DETECTED NOT DETECTED Final   Mycoplasma pneumoniae NOT DETECTED NOT DETECTED Final    Comment: Performed at [facility], [place]. [address], [place], [place] [address]  MRSA Next Gen by PCR, Nasal     Status: None   Collection Time: 12/17/20  4:06 PM   Specimen: Nasal Mucosa; Nasal Swab  Result Value Ref Range Status   MRSA by PCR Next Gen NOT DETECTED NOT DETECTED Final    Comment: (NOTE) The GeneXpert MRSA Assay (FDA approved for NASAL specimens only), is one component of a comprehensive MRSA colonization surveillance program. It is not intended to diagnose MRSA infection nor to guide or monitor treatment for MRSA infections. Test performance is not FDA approved in patients less than 30 years old. Performed at [facility], [place] [address] [place], [place] [address]      Labs: BNP (last 3 results) Recent Labs    12/17/20 1140  BNP [id number]*   Basic Metabolic Panel: Recent Labs  Lab 12/17/20 0939 12/18/20 0230 12/18/20 0817 12/19/20 0244 12/20/20 0459  NA 144  --  140 139 140  K 4.3  --  4.3 3.6 3.6  CL 113*  --  107 105 106  CO2 18*  --  '22 26 26  '$ GLUCOSE 139*  --  116* 174* 98  BUN 20  --  23 28* 24*  CREATININE 0.91  --  0.93 1.17 0.91  CALCIUM 9.4  --  9.3 8.8* 9.0  MG  --  1.9  --   --   --  PHOS  --  4.6  --   --   --    Liver Function Tests: Recent Labs  Lab 12/17/20 1315  AST 29  ALT 33  ALKPHOS 38  BILITOT 0.7  PROT 6.7  ALBUMIN 3.8   No results for input(s): LIPASE, AMYLASE in the last 168 hours. No results for input(s): AMMONIA in the last 168 hours. CBC: Recent Labs  Lab 12/17/20 0939 12/18/20 0938 12/19/20 0244 12/20/20 0459  WBC 14.7* 14.1* 14.7* 16.3*  HGB 12.4* 13.2 13.4 14.3  HCT 37.4* 38.7* 39.4 41.9  MCV 91.4 89.6 89.7 89.1  PLT 194 193 202 197   Cardiac Enzymes: No results for input(s): CKTOTAL, CKMB, CKMBINDEX, TROPONINI in the last 168 hours. BNP: Invalid input(s): POCBNP CBG: Recent Labs  Lab 12/19/20 0346 12/19/20 0742 12/19/20 1138 12/19/20 1559 12/20/20 0753  GLUCAP 202* 118* 162* 103* 119*   D-Dimer No results for input(s): DDIMER in the last 72 hours. Hgb A1c Recent Labs    12/17/20 1635  HGBA1C 6.1*   Lipid Profile No results for input(s): CHOL, HDL, LDLCALC, TRIG, CHOLHDL, LDLDIRECT in the last 72 hours. Thyroid function studies No results for input(s): TSH, T4TOTAL, T3FREE, THYROIDAB in the last 72 hours.  Invalid input(s): FREET3 Anemia work up No results for input(s): VITAMINB12, FOLATE, FERRITIN, TIBC, IRON, RETICCTPCT in the last 72 hours. Urinalysis    Component Value Date/Time   APPEARANCEUR Clear 02/09/2017 1238   GLUCOSEU 3+ (A) 02/09/2017 1238   BILIRUBINUR Negative 02/09/2017 1238   PROTEINUR Negative 02/09/2017 1238   UROBILINOGEN 0.2 06/04/2014 1539   NITRITE Negative 02/09/2017 1238   LEUKOCYTESUR Negative 02/09/2017 1238   Sepsis Labs Invalid input(s): PROCALCITONIN,  WBC,  LACTICIDVEN Microbiology Recent Results (from the past 240 hour(s))  Resp Panel by RT-PCR (Flu A&B, Covid) Nasopharyngeal Swab     Status: None   Collection Time: 12/17/20 11:04 AM   Specimen: Nasopharyngeal Swab; Nasopharyngeal(NP) swabs in vial transport medium  Result Value Ref Range Status   SARS Coronavirus 2  by RT PCR NEGATIVE NEGATIVE Final    Comment: (NOTE) SARS-CoV-2 target nucleic acids are NOT DETECTED.  The SARS-CoV-2 RNA is generally detectable in upper respiratory specimens during the acute phase of infection. The lowest concentration of SARS-CoV-2 viral copies this assay can detect is 138 copies/mL. A negative result does not preclude SARS-Cov-2 infection and should not be used as the sole basis for treatment or other patient management decisions. A negative result may occur with  improper specimen collection/handling, submission of specimen other than nasopharyngeal swab, presence of viral mutation(s) within the areas targeted by this assay, and inadequate number of viral copies(<138 copies/mL). A negative result must be combined with clinical observations, patient history, and epidemiological information. The expected result is Negative.  Fact Sheet for Patients:  EntrepreneurPulse.com.au  Fact Sheet for Healthcare Providers:  IncredibleEmployment.be  This test is no t yet approved or cleared by the [place] FDA and  has been authorized for detection and/or diagnosis of SARS-CoV-2 by FDA under an Emergency Use Authorization (EUA). This EUA will remain  in effect (meaning this test can be used) for the duration of the COVID-19 declaration under Section 564(b)(1) of the Act, 21 U.S.C.section 360bbb-3(b)(1), unless the authorization is terminated  or revoked sooner.       Influenza A by PCR NEGATIVE NEGATIVE Final   Influenza B by PCR NEGATIVE NEGATIVE Final    Comment: (NOTE) The Xpert Xpress SARS-CoV-2/FLU/RSV plus assay is intended as an  aid in the diagnosis of influenza from Nasopharyngeal swab specimens and should not be used as a sole basis for treatment. Nasal washings and aspirates are unacceptable for Xpert Xpress SARS-CoV-2/FLU/RSV testing.  Fact Sheet for Patients: EntrepreneurPulse.com.au  Fact  Sheet for Healthcare Providers: IncredibleEmployment.be  This test is not yet approved or cleared by the [place] FDA and has been authorized for detection and/or diagnosis of SARS-CoV-2 by FDA under an Emergency Use Authorization (EUA). This EUA will remain in effect (meaning this test can be used) for the duration of the COVID-19 declaration under Section 564(b)(1) of the Act, 21 U.S.C. section 360bbb-3(b)(1), unless the authorization is terminated or revoked.  Performed at [facility], [place] [address], [place], [place] [address]   Respiratory (~20 pathogens) panel by PCR     Status: None   Collection Time: 12/17/20  3:13 PM   Specimen: Nasopharyngeal Swab; Respiratory  Result Value Ref Range Status   Adenovirus NOT DETECTED NOT DETECTED Final   Coronavirus 229E NOT DETECTED NOT DETECTED Final    Comment: (NOTE) The Coronavirus on the Respiratory Panel, DOES NOT test for the novel  Coronavirus (2019 nCoV)    Coronavirus HKU1 NOT DETECTED NOT DETECTED Final   Coronavirus NL63 NOT DETECTED NOT DETECTED Final   Coronavirus OC43 NOT DETECTED NOT DETECTED Final   Metapneumovirus NOT DETECTED NOT DETECTED Final   Rhinovirus / Enterovirus NOT DETECTED NOT DETECTED Final   Influenza A NOT DETECTED NOT DETECTED Final   Influenza B NOT DETECTED NOT DETECTED Final   Parainfluenza Virus 1 NOT DETECTED NOT DETECTED Final   Parainfluenza Virus 2 NOT DETECTED NOT DETECTED Final   Parainfluenza Virus 3 NOT DETECTED NOT DETECTED Final   Parainfluenza Virus 4 NOT DETECTED NOT DETECTED Final   Respiratory Syncytial Virus NOT DETECTED NOT DETECTED Final   Bordetella pertussis NOT DETECTED NOT DETECTED Final   Bordetella Parapertussis NOT DETECTED NOT DETECTED Final   Chlamydophila pneumoniae NOT DETECTED NOT DETECTED Final   Mycoplasma pneumoniae NOT DETECTED NOT DETECTED Final    Comment: Performed at [facility], [place]. [address],  [place], [place] [address]  MRSA Next Gen by PCR, Nasal     Status: None   Collection Time: 12/17/20  4:06 PM   Specimen: Nasal Mucosa; Nasal Swab  Result Value Ref Range Status   MRSA by PCR Next Gen NOT DETECTED NOT DETECTED Final    Comment: (NOTE) The GeneXpert MRSA Assay (FDA approved for NASAL specimens only), is one component of a comprehensive MRSA colonization surveillance program. It is not intended to diagnose MRSA infection nor to guide or monitor treatment for MRSA infections. Test performance is not FDA approved in patients less than 15 years old. Performed at [facility], [place] [address] [place],  [address]      Patient was seen and examined on the day of discharge and was found to be in stable condition. Time coordinating discharge: 25 minutes including assessment and coordination of care, as well as examination of the patient.   SIGNED:  Dessa Phi, DO [facility] 12/20/2020, 12:28 PM

## 2020-12-21 ENCOUNTER — Other Ambulatory Visit: Payer: Self-pay | Admitting: *Deleted

## 2020-12-21 ENCOUNTER — Encounter: Payer: Self-pay | Admitting: Physician Assistant

## 2020-12-21 ENCOUNTER — Other Ambulatory Visit: Payer: Self-pay | Admitting: Physician Assistant

## 2020-12-21 DIAGNOSIS — I350 Nonrheumatic aortic (valve) stenosis: Secondary | ICD-10-CM

## 2020-12-21 NOTE — Progress Notes (Signed)
Error

## 2020-12-22 DIAGNOSIS — Z7984 Long term (current) use of oral hypoglycemic drugs: Secondary | ICD-10-CM | POA: Diagnosis not present

## 2020-12-22 DIAGNOSIS — E119 Type 2 diabetes mellitus without complications: Secondary | ICD-10-CM | POA: Diagnosis not present

## 2020-12-22 DIAGNOSIS — J449 Chronic obstructive pulmonary disease, unspecified: Secondary | ICD-10-CM | POA: Diagnosis not present

## 2020-12-22 DIAGNOSIS — I700 Atherosclerosis of aorta: Secondary | ICD-10-CM | POA: Diagnosis not present

## 2020-12-22 DIAGNOSIS — F419 Anxiety disorder, unspecified: Secondary | ICD-10-CM | POA: Diagnosis not present

## 2020-12-22 DIAGNOSIS — I493 Ventricular premature depolarization: Secondary | ICD-10-CM | POA: Diagnosis not present

## 2020-12-22 DIAGNOSIS — Z7901 Long term (current) use of anticoagulants: Secondary | ICD-10-CM | POA: Diagnosis not present

## 2020-12-22 DIAGNOSIS — G250 Essential tremor: Secondary | ICD-10-CM | POA: Diagnosis not present

## 2020-12-22 DIAGNOSIS — H409 Unspecified glaucoma: Secondary | ICD-10-CM | POA: Diagnosis not present

## 2020-12-22 DIAGNOSIS — I083 Combined rheumatic disorders of mitral, aortic and tricuspid valves: Secondary | ICD-10-CM | POA: Diagnosis not present

## 2020-12-22 DIAGNOSIS — Z8701 Personal history of pneumonia (recurrent): Secondary | ICD-10-CM | POA: Diagnosis not present

## 2020-12-22 DIAGNOSIS — F32A Depression, unspecified: Secondary | ICD-10-CM | POA: Diagnosis not present

## 2020-12-22 DIAGNOSIS — I25118 Atherosclerotic heart disease of native coronary artery with other forms of angina pectoris: Secondary | ICD-10-CM | POA: Diagnosis not present

## 2020-12-22 DIAGNOSIS — Z86010 Personal history of colonic polyps: Secondary | ICD-10-CM | POA: Diagnosis not present

## 2020-12-22 DIAGNOSIS — C61 Malignant neoplasm of prostate: Secondary | ICD-10-CM | POA: Diagnosis not present

## 2020-12-22 DIAGNOSIS — C9111 Chronic lymphocytic leukemia of B-cell type in remission: Secondary | ICD-10-CM | POA: Diagnosis not present

## 2020-12-22 DIAGNOSIS — F17210 Nicotine dependence, cigarettes, uncomplicated: Secondary | ICD-10-CM | POA: Diagnosis not present

## 2020-12-22 DIAGNOSIS — E785 Hyperlipidemia, unspecified: Secondary | ICD-10-CM | POA: Diagnosis not present

## 2020-12-22 DIAGNOSIS — K5790 Diverticulosis of intestine, part unspecified, without perforation or abscess without bleeding: Secondary | ICD-10-CM | POA: Diagnosis not present

## 2020-12-22 DIAGNOSIS — I110 Hypertensive heart disease with heart failure: Secondary | ICD-10-CM | POA: Diagnosis not present

## 2020-12-22 DIAGNOSIS — I5031 Acute diastolic (congestive) heart failure: Secondary | ICD-10-CM | POA: Diagnosis not present

## 2020-12-22 DIAGNOSIS — I480 Paroxysmal atrial fibrillation: Secondary | ICD-10-CM | POA: Diagnosis not present

## 2020-12-22 DIAGNOSIS — G4730 Sleep apnea, unspecified: Secondary | ICD-10-CM | POA: Diagnosis not present

## 2020-12-22 DIAGNOSIS — D630 Anemia in neoplastic disease: Secondary | ICD-10-CM | POA: Diagnosis not present

## 2020-12-22 DIAGNOSIS — I252 Old myocardial infarction: Secondary | ICD-10-CM | POA: Diagnosis not present

## 2020-12-22 NOTE — Telephone Encounter (Signed)
Received a call back from patient he stated he was told at hospital to take Metoprolol 50 mg twice a day.He wanted to ask Dr.Croitoru if he should take what he was previously taking 100 mg twice a day.Advised Dr.Croitoru RN will call back with directions.

## 2020-12-22 NOTE — Telephone Encounter (Signed)
Called patient to discuss email.Left message on personal voice mail to discuss.

## 2020-12-23 DIAGNOSIS — E119 Type 2 diabetes mellitus without complications: Secondary | ICD-10-CM | POA: Diagnosis not present

## 2020-12-23 DIAGNOSIS — I480 Paroxysmal atrial fibrillation: Secondary | ICD-10-CM | POA: Diagnosis not present

## 2020-12-23 DIAGNOSIS — I25118 Atherosclerotic heart disease of native coronary artery with other forms of angina pectoris: Secondary | ICD-10-CM | POA: Diagnosis not present

## 2020-12-23 DIAGNOSIS — I110 Hypertensive heart disease with heart failure: Secondary | ICD-10-CM | POA: Diagnosis not present

## 2020-12-23 DIAGNOSIS — I5031 Acute diastolic (congestive) heart failure: Secondary | ICD-10-CM | POA: Diagnosis not present

## 2020-12-23 DIAGNOSIS — J449 Chronic obstructive pulmonary disease, unspecified: Secondary | ICD-10-CM | POA: Diagnosis not present

## 2020-12-24 DIAGNOSIS — I5031 Acute diastolic (congestive) heart failure: Secondary | ICD-10-CM | POA: Diagnosis not present

## 2020-12-24 DIAGNOSIS — J449 Chronic obstructive pulmonary disease, unspecified: Secondary | ICD-10-CM | POA: Diagnosis not present

## 2020-12-24 DIAGNOSIS — I110 Hypertensive heart disease with heart failure: Secondary | ICD-10-CM | POA: Diagnosis not present

## 2020-12-24 DIAGNOSIS — E119 Type 2 diabetes mellitus without complications: Secondary | ICD-10-CM | POA: Diagnosis not present

## 2020-12-24 DIAGNOSIS — I480 Paroxysmal atrial fibrillation: Secondary | ICD-10-CM | POA: Diagnosis not present

## 2020-12-24 DIAGNOSIS — I25118 Atherosclerotic heart disease of native coronary artery with other forms of angina pectoris: Secondary | ICD-10-CM | POA: Diagnosis not present

## 2020-12-28 ENCOUNTER — Ambulatory Visit (HOSPITAL_COMMUNITY): Payer: Medicare Other | Admitting: Licensed Clinical Social Worker

## 2020-12-28 DIAGNOSIS — I5031 Acute diastolic (congestive) heart failure: Secondary | ICD-10-CM | POA: Diagnosis not present

## 2020-12-28 DIAGNOSIS — E119 Type 2 diabetes mellitus without complications: Secondary | ICD-10-CM | POA: Diagnosis not present

## 2020-12-28 DIAGNOSIS — I25118 Atherosclerotic heart disease of native coronary artery with other forms of angina pectoris: Secondary | ICD-10-CM | POA: Diagnosis not present

## 2020-12-28 DIAGNOSIS — I480 Paroxysmal atrial fibrillation: Secondary | ICD-10-CM | POA: Diagnosis not present

## 2020-12-28 DIAGNOSIS — I110 Hypertensive heart disease with heart failure: Secondary | ICD-10-CM | POA: Diagnosis not present

## 2020-12-28 DIAGNOSIS — J449 Chronic obstructive pulmonary disease, unspecified: Secondary | ICD-10-CM | POA: Diagnosis not present

## 2020-12-29 DIAGNOSIS — E559 Vitamin D deficiency, unspecified: Secondary | ICD-10-CM | POA: Diagnosis not present

## 2020-12-29 DIAGNOSIS — E789 Disorder of lipoprotein metabolism, unspecified: Secondary | ICD-10-CM | POA: Diagnosis not present

## 2020-12-29 DIAGNOSIS — I10 Essential (primary) hypertension: Secondary | ICD-10-CM | POA: Diagnosis not present

## 2020-12-29 DIAGNOSIS — E119 Type 2 diabetes mellitus without complications: Secondary | ICD-10-CM | POA: Diagnosis not present

## 2020-12-29 DIAGNOSIS — Z09 Encounter for follow-up examination after completed treatment for conditions other than malignant neoplasm: Secondary | ICD-10-CM | POA: Diagnosis not present

## 2020-12-29 DIAGNOSIS — F17210 Nicotine dependence, cigarettes, uncomplicated: Secondary | ICD-10-CM | POA: Diagnosis not present

## 2020-12-30 ENCOUNTER — Ambulatory Visit (HOSPITAL_COMMUNITY)
Admission: RE | Admit: 2020-12-30 | Discharge: 2020-12-30 | Disposition: A | Discharge status: Home / Self Care | Payer: Medicare Other | Source: Ambulatory Visit | Attending: Physician Assistant | Admitting: Physician Assistant

## 2020-12-30 ENCOUNTER — Other Ambulatory Visit: Payer: Self-pay

## 2020-12-30 DIAGNOSIS — I5031 Acute diastolic (congestive) heart failure: Secondary | ICD-10-CM | POA: Diagnosis not present

## 2020-12-30 DIAGNOSIS — E119 Type 2 diabetes mellitus without complications: Secondary | ICD-10-CM | POA: Diagnosis not present

## 2020-12-30 DIAGNOSIS — I25118 Atherosclerotic heart disease of native coronary artery with other forms of angina pectoris: Secondary | ICD-10-CM | POA: Diagnosis not present

## 2020-12-30 DIAGNOSIS — I350 Nonrheumatic aortic (valve) stenosis: Secondary | ICD-10-CM | POA: Diagnosis not present

## 2020-12-30 DIAGNOSIS — K5730 Diverticulosis of large intestine without perforation or abscess without bleeding: Secondary | ICD-10-CM | POA: Diagnosis not present

## 2020-12-30 DIAGNOSIS — J449 Chronic obstructive pulmonary disease, unspecified: Secondary | ICD-10-CM | POA: Diagnosis not present

## 2020-12-30 DIAGNOSIS — I480 Paroxysmal atrial fibrillation: Secondary | ICD-10-CM | POA: Diagnosis not present

## 2020-12-30 DIAGNOSIS — J90 Pleural effusion, not elsewhere classified: Secondary | ICD-10-CM | POA: Diagnosis not present

## 2020-12-30 DIAGNOSIS — I110 Hypertensive heart disease with heart failure: Secondary | ICD-10-CM | POA: Diagnosis not present

## 2020-12-30 MED ORDER — DILTIAZEM HCL 25 MG/5ML IV SOLN
5.0000 mg | INTRAVENOUS | Status: DC | PRN
  Administered 2020-12-30: 5 mg via INTRAVENOUS

## 2020-12-30 MED ORDER — IOHEXOL 350 MG/ML SOLN: 100.0000 mL | Freq: Once | INTRAVENOUS | Status: DC | PRN

## 2020-12-30 MED ORDER — IOHEXOL 350 MG/ML SOLN
95.0000 mL | Freq: Once | INTRAVENOUS | Status: AC | PRN
  Administered 2020-12-30: 95 mL via INTRAVENOUS

## 2020-12-30 MED ORDER — DILTIAZEM HCL 25 MG/5ML IV SOLN
INTRAVENOUS | Status: AC
  Administered 2020-12-30: 5 mg
  Filled 2020-12-30: qty 5

## 2020-12-30 MED ORDER — DILTIAZEM HCL 25 MG/5ML IV SOLN: 5.0000 mg | INTRAVENOUS | Status: DC | PRN

## 2020-12-31 DIAGNOSIS — I5031 Acute diastolic (congestive) heart failure: Secondary | ICD-10-CM | POA: Diagnosis not present

## 2020-12-31 DIAGNOSIS — I110 Hypertensive heart disease with heart failure: Secondary | ICD-10-CM | POA: Diagnosis not present

## 2020-12-31 DIAGNOSIS — I25118 Atherosclerotic heart disease of native coronary artery with other forms of angina pectoris: Secondary | ICD-10-CM | POA: Diagnosis not present

## 2020-12-31 DIAGNOSIS — I480 Paroxysmal atrial fibrillation: Secondary | ICD-10-CM | POA: Diagnosis not present

## 2020-12-31 DIAGNOSIS — J449 Chronic obstructive pulmonary disease, unspecified: Secondary | ICD-10-CM | POA: Diagnosis not present

## 2020-12-31 DIAGNOSIS — E119 Type 2 diabetes mellitus without complications: Secondary | ICD-10-CM | POA: Diagnosis not present

## 2021-01-03 ENCOUNTER — Encounter: Payer: Self-pay | Admitting: *Deleted

## 2021-01-04 DIAGNOSIS — J449 Chronic obstructive pulmonary disease, unspecified: Secondary | ICD-10-CM | POA: Diagnosis not present

## 2021-01-04 DIAGNOSIS — Z9841 Cataract extraction status, right eye: Secondary | ICD-10-CM | POA: Diagnosis not present

## 2021-01-04 DIAGNOSIS — I25118 Atherosclerotic heart disease of native coronary artery with other forms of angina pectoris: Secondary | ICD-10-CM | POA: Diagnosis not present

## 2021-01-04 DIAGNOSIS — H35713 Central serous chorioretinopathy, bilateral: Secondary | ICD-10-CM | POA: Diagnosis not present

## 2021-01-04 DIAGNOSIS — I5031 Acute diastolic (congestive) heart failure: Secondary | ICD-10-CM | POA: Diagnosis not present

## 2021-01-04 DIAGNOSIS — H401132 Primary open-angle glaucoma, bilateral, moderate stage: Secondary | ICD-10-CM | POA: Diagnosis not present

## 2021-01-04 DIAGNOSIS — H31011 Macula scars of posterior pole (postinflammatory) (post-traumatic), right eye: Secondary | ICD-10-CM | POA: Diagnosis not present

## 2021-01-04 DIAGNOSIS — E119 Type 2 diabetes mellitus without complications: Secondary | ICD-10-CM | POA: Diagnosis not present

## 2021-01-04 DIAGNOSIS — I480 Paroxysmal atrial fibrillation: Secondary | ICD-10-CM | POA: Diagnosis not present

## 2021-01-04 DIAGNOSIS — Z961 Presence of intraocular lens: Secondary | ICD-10-CM | POA: Diagnosis not present

## 2021-01-04 DIAGNOSIS — I110 Hypertensive heart disease with heart failure: Secondary | ICD-10-CM | POA: Diagnosis not present

## 2021-01-05 DIAGNOSIS — I5031 Acute diastolic (congestive) heart failure: Secondary | ICD-10-CM | POA: Diagnosis not present

## 2021-01-05 DIAGNOSIS — E119 Type 2 diabetes mellitus without complications: Secondary | ICD-10-CM | POA: Diagnosis not present

## 2021-01-05 DIAGNOSIS — J449 Chronic obstructive pulmonary disease, unspecified: Secondary | ICD-10-CM | POA: Diagnosis not present

## 2021-01-05 DIAGNOSIS — I25118 Atherosclerotic heart disease of native coronary artery with other forms of angina pectoris: Secondary | ICD-10-CM | POA: Diagnosis not present

## 2021-01-05 DIAGNOSIS — I480 Paroxysmal atrial fibrillation: Secondary | ICD-10-CM | POA: Diagnosis not present

## 2021-01-05 DIAGNOSIS — I110 Hypertensive heart disease with heart failure: Secondary | ICD-10-CM | POA: Diagnosis not present

## 2021-01-06 DIAGNOSIS — I480 Paroxysmal atrial fibrillation: Secondary | ICD-10-CM | POA: Diagnosis not present

## 2021-01-06 DIAGNOSIS — I110 Hypertensive heart disease with heart failure: Secondary | ICD-10-CM | POA: Diagnosis not present

## 2021-01-06 DIAGNOSIS — I5031 Acute diastolic (congestive) heart failure: Secondary | ICD-10-CM | POA: Diagnosis not present

## 2021-01-06 DIAGNOSIS — I25118 Atherosclerotic heart disease of native coronary artery with other forms of angina pectoris: Secondary | ICD-10-CM | POA: Diagnosis not present

## 2021-01-06 DIAGNOSIS — E119 Type 2 diabetes mellitus without complications: Secondary | ICD-10-CM | POA: Diagnosis not present

## 2021-01-06 DIAGNOSIS — J449 Chronic obstructive pulmonary disease, unspecified: Secondary | ICD-10-CM | POA: Diagnosis not present

## 2021-01-10 ENCOUNTER — Other Ambulatory Visit: Payer: Self-pay

## 2021-01-10 ENCOUNTER — Encounter: Payer: Self-pay | Admitting: Cardiovascular Disease

## 2021-01-10 ENCOUNTER — Ambulatory Visit (INDEPENDENT_AMBULATORY_CARE_PROVIDER_SITE_OTHER): Payer: Medicare Other | Admitting: Cardiovascular Disease

## 2021-01-10 VITALS — BP 140/62 | HR 97 | Ht 68.5 in | Wt 210.0 lb

## 2021-01-10 DIAGNOSIS — E785 Hyperlipidemia, unspecified: Secondary | ICD-10-CM

## 2021-01-10 DIAGNOSIS — C61 Malignant neoplasm of prostate: Secondary | ICD-10-CM

## 2021-01-10 DIAGNOSIS — C9110 Chronic lymphocytic leukemia of B-cell type not having achieved remission: Secondary | ICD-10-CM | POA: Diagnosis not present

## 2021-01-10 DIAGNOSIS — I350 Nonrheumatic aortic (valve) stenosis: Secondary | ICD-10-CM

## 2021-01-10 DIAGNOSIS — Z7901 Long term (current) use of anticoagulants: Secondary | ICD-10-CM | POA: Diagnosis not present

## 2021-01-10 DIAGNOSIS — E669 Obesity, unspecified: Secondary | ICD-10-CM | POA: Diagnosis not present

## 2021-01-10 DIAGNOSIS — C775 Secondary and unspecified malignant neoplasm of intrapelvic lymph nodes: Secondary | ICD-10-CM

## 2021-01-10 DIAGNOSIS — F17200 Nicotine dependence, unspecified, uncomplicated: Secondary | ICD-10-CM | POA: Diagnosis not present

## 2021-01-10 DIAGNOSIS — E118 Type 2 diabetes mellitus with unspecified complications: Secondary | ICD-10-CM

## 2021-01-10 DIAGNOSIS — I4819 Other persistent atrial fibrillation: Secondary | ICD-10-CM

## 2021-01-10 DIAGNOSIS — G4733 Obstructive sleep apnea (adult) (pediatric): Secondary | ICD-10-CM

## 2021-01-10 DIAGNOSIS — I2510 Atherosclerotic heart disease of native coronary artery without angina pectoris: Secondary | ICD-10-CM | POA: Diagnosis not present

## 2021-01-10 DIAGNOSIS — I10 Essential (primary) hypertension: Secondary | ICD-10-CM

## 2021-01-10 NOTE — Patient Instructions (Signed)
Medication Instructions:  No changes *If you need a refill on your cardiac medications before your next appointment, please call your pharmacy*   Lab Work: None ordered If you have labs (blood work) drawn today and your tests are completely normal, you will receive your results only by: [place] (if you have MyChart) OR A paper copy in the mail If you have any lab test that is abnormal or we need to change your treatment, we will call you to review the results.   Testing/Procedures: None ordered   Follow-Up: At [facility], you and your health needs are our priority.  As part of our continuing mission to provide you with exceptional heart care, we have created designated Provider Care Teams.  These Care Teams include your primary Cardiologist (physician) and Advanced Practice Providers (APPs -  Physician Assistants and Nurse Practitioners) who all work together to provide you with the care you need, when you need it.  We recommend signing up for the patient portal called "MyChart".  Sign up information is provided on this After Visit Summary.  MyChart is used to connect with patients for Virtual Visits (Telemedicine).  Patients are able to view lab/test results, encounter notes, upcoming appointments, etc.  Non-urgent messages can be sent to your provider as well.   To learn more about what you can do with MyChart, go to [email or web address].    Your next appointment:   Keep your follow up on 04/29/21 with Dr. Sallyanne Kuster

## 2021-01-10 NOTE — Progress Notes (Signed)
Cardiology Office Note    Date:  01/11/2021   ID:  Rodney Friedman, DOB 16-Mar-1946, MRN [id number]  PCP:  Rodney Cowman, MD  Cardiologist:   Rodney Klein, MD   chief complaint: CAD, AFib, AS, HTN, HLP   History of Present Illness:  Rodney Friedman is a 75 y.o. male with CAD and remote CABG (three-vessel, 08-15-2004), paroxysmal atrial fibrillation, mild aortic stenosis, carotid artery stenosis, hypertension, hyperlipidemia and type 2 diabetes mellitus.  He was hospitalized in late July with pneumonia and since then has had worsening shortness of breath with activity.  A follow-up echocardiogram during that hospitalization showed progression of aortic stenosis to severe range.  Although his gradients were not quite severely elevated (mean gradient 33 mmHg, maximal velocity 3.8 m/s), the dimensionless valve index was very low at 0.16 and the calculated aortic valve area was 0.6 cm.  Stroke-volume index was 27 mL/minute/m sq BSA.  Findings are consistent with low-flow low gradient severe aortic stenosis.  CT shows a valve calcium score over 3000, also consistent with severe aortic stenosis.  Anatomy appears appropriate for a 26 mm SAPIEN 3 valve.  The aortic root is not dilated.  CT angiography shows that he would be appropriate for transfemoral access for TAVR.   The CT also shows that he has patent LIMA to LAD and patent SVG to diagonal and SVG to distal OM.  Recently has had to stop losartan and amlodipine due to recurrent problems with hypotension.  Right now he is only taking metoprolol which serves both for rate control and blood pressure control.  He does not have edema, orthopnea, PND or chest discomfort either at rest or with exertion.  He has not experienced syncope, but has had some episodes of exertional dizziness.  He successfully quit smoking for the first time in his life 25 days ago.  He has stayed quit, although he has a lot of urges to resume smoking.  He has never had a  stroke or embolic event.  He is currently on warfarin anticoagulation without bleeding complications.  Glycemic control has been good with a recent hemoglobin A1c of 6.1%.  His most recent LDL cholesterol was 60 on statin plus ezetimibe therapy.  His prostate cancer is currently considered to be in remission.   Rodney Friedman's father passed away in 08-16-2019 at age 27.  He was very dedicated to his father's care and misses him.  However, his level of stress has gone down tremendously. He  is well versed in the theoretical aspects of addiction since he has a Masters degree on the topic.  He has a history of coronary disease and underwent 3 vessel bypass surgery in [place] in 15-Aug-2004 ("heartburn" and left antecubital pain ) in July 2014 he had an episode of atrial fibrillation. He has significant left ventricular hypertrophy and very mild aortic valve stenosis. He has preserved left ventricular systolic function although there is some degree of anterior hypokinesis by previous echo. His nuclear stress test in June 2013 showed normal pattern of perfusion and an ejection fraction of 52%. He has bilateral carotid bruits that are reportedly due to external carotid artery stenoses. His last carotid ultrasound was performed in September 2017 and showed no significant internal carotid stenosis.   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 He has treated prostate cancer and is followed by Dr. Alvy Bimler.  He has a long history of alcohol and drug use but has been  sober for over 15 years and still goes to [employer]. Unfortunately he still smokes roughly a few cigarettes a day , which he believes he needs due to anxiety.    Past Medical History:  Diagnosis Date   Adenomatous colon polyp    Alcoholism ([place])    Anxiety    Atrial fibrillation (HCC)    CAD (coronary artery disease)    Cataract    removed left eye    CLL (chronic lymphocytic leukemia) ([place]) 07/08/2013    Depression    Diabetes mellitus without complication (HCC)    Diverticulosis    Eye abnormality    Macular scarring R eye   Glaucoma    Heart murmur    HLD (hyperlipidemia)    Hypertension    Leukemia ([place])    CLL   Myocardial infarction ([place]) 2006   Prostate cancer ([place]) 07/08/2013   [facility]- getting Lupron shot every 6 months - traces in prostate and 2 lymphnodes left hip- non focused traces per pt    Sleep apnea    Substance abuse ([place])    Tremor, essential 09/22/2015    Past Surgical History:  Procedure Laterality Date   Arm Surgery Right    from door accident with glass   CATARACT EXTRACTION W/ INTRAOCULAR LENS IMPLANT Left    COLONOSCOPY     CORONARY ARTERY BYPASS GRAFT  08/04/2004   POLYPECTOMY      Current Medications: Outpatient Medications Prior to Visit  Medication Sig Dispense Refill   Ascorbic Acid (VITAMIN C) 1000 MG tablet Take 1,000 mg by mouth See admin instructions. Take 1 tablet  every other day alternating with '500mg'$  tablet     B Complex Vitamins (VITAMIN-B COMPLEX PO) Take 1 tablet by mouth at bedtime.     BIOTIN PO Take 1 tablet by mouth at bedtime.     buPROPion ER (WELLBUTRIN SR) 100 MG 12 hr tablet 2  qam   1  q diner 270 tablet 1   Calcium Carbonate-Vit D-Min (CALCIUM 1200 PO) Take 1,200 mg by mouth daily.     docusate sodium (COLACE) 100 MG capsule Take 100 mg by mouth.     ezetimibe (ZETIA) 10 MG tablet Take 1 tablet (10 mg total) by mouth daily. 90 tablet 3   furosemide (LASIX) 20 MG tablet Take 1 tablet (20 mg total) by mouth daily. 30 tablet 0   gabapentin (NEURONTIN) 100 MG capsule 3  qhs 270 capsule 1   ketoconazole (NIZORAL) 2 % shampoo Apply 1 application topically 2 (two) times a week. 120 mL 3   latanoprost (XALATAN) 0.005 % ophthalmic solution Place 1 drop into the left eye every other day.     LYCOPENE PO Take 10 mg by mouth every other day.     Magnesium 250 MG TABS Take 250 mg by mouth every 3 (three) days.      metFORMIN (GLUCOPHAGE) 500 MG tablet Take 1 tablet (500 mg total) by mouth 2 (two) times daily with a meal. 180 tablet 2   metoprolol (TOPROL-XL) 50 MG 24 hr tablet Take 1 tablet (50 mg total) by mouth 2 (two) times daily. Take with or immediately following a meal. 30 tablet 0   Omega-3 Fatty Acids (FISH OIL) 1000 MG CAPS Take 1,000 mg by mouth every other day.     rosuvastatin (CRESTOR) 20 MG tablet Take 1 tablet (20 mg total) by mouth daily. 90 tablet 3   Sodium Fluoride (PREVIDENT 5000 DRY MOUTH DT)  Place 1 application onto teeth See admin instructions. Apply to gums every night for dry mouth     vitamin B-12 (CYANOCOBALAMIN) 500 MCG tablet Take 500 mcg by mouth every other day.     vitamin C (ASCORBIC ACID) 500 MG tablet Take 500 mg by mouth daily. Take 1 tablet every other day alternating with '1000mg'$      Vitamin D, Ergocalciferol, (DRISDOL) 1.25 MG (50000 UNIT) CAPS capsule Take 50,000 Units by mouth every Saturday.     warfarin (COUMADIN) 4 MG tablet TAKE 1/2 TO 1 TABLET DAILY AS DIRECTED BY COUMADIN CLINIC 90 tablet 1   Facility-Administered Medications Prior to Visit  Medication Dose Route Frequency Provider Last Rate Last Admin   0.9 %  sodium chloride infusion  500 mL Intravenous Once Doran Stabler, MD         Allergies:   Other   Social History   Socioeconomic History   Marital status: Divorced    Spouse name: Not on file   Number of children: 2   Years of education: 16   Highest education level: Not on file  Occupational History   Occupation: Retired Pharmacist, hospital  Tobacco Use   Smoking status: Former    Packs/day: 0.50    Years: 50.00    Pack years: 25.00    Types: Cigarettes    Quit date: 12/16/2020    Years since quitting: 0.0   Smokeless tobacco: Never  Vaping Use   Vaping Use: Never used  Substance and Sexual Activity   Alcohol use: No    Alcohol/week: 0.0 standard drinks   Drug use: No   Sexual activity: Not Currently  Other Topics Concern   Not on file   Social History Narrative   Lives at home w/ his father   Divorced   Right-handed   Education: Secretary/administrator   No caffeine   Social Determinants of Radio broadcast assistant Strain: Not on file  Food Insecurity: Not on file  Transportation Needs: Not on file  Physical Activity: Not on file  Stress: Not on file  Social Connections: Not on file     Family History:  The patient's family history includes Colon cancer in his mother; Hypertension in his father; Ovarian cancer in his mother; Prostate cancer in his father; Uterine cancer in his mother.   ROS:   Please see the history of present illness.   All other systems are reviewed and are negative.   PHYSICAL EXAM:   VS:  BP 140/62 (BP Location: Left Arm, Patient Position: Sitting, Cuff Size: Large)   Pulse 97   Ht 5' 8.5" (1.74 m)   Wt 210 lb (95.3 kg)   SpO2 98%   BMI 31.47 kg/m      General: Alert, oriented x3, no distress, mildly obese Head: no evidence of trauma, PERRL, EOMI, no exophtalmos or lid lag, no myxedema, no xanthelasma; normal ears, nose and oropharynx Neck: normal jugular venous pulsations and no hepatojugular reflux; brisk carotid pulses without delay, but with bilateral carotid bruits Chest: clear to auscultation, no signs of consolidation by percussion or palpation, normal fremitus, symmetrical and full respiratory excursions Cardiovascular: normal position and quality of the apical impulse, irregular rhythm, normal first and second heart sounds, 3/6 aortic ejection murmur, no diastolic murmurs, rubs or gallops Abdomen: no tenderness or distention, no masses by palpation, no abnormal pulsatility or arterial bruits, normal bowel sounds, no hepatosplenomegaly Extremities: no clubbing, cyanosis or edema; 2+ radial, ulnar and brachial pulses bilaterally;  2+ right femoral, posterior tibial and dorsalis pedis pulses; 2+ left femoral, posterior tibial and dorsalis pedis pulses; no subclavian or femoral  bruits Neurological: grossly nonfocal Psych: Normal mood and affect   Wt Readings from Last 3 Encounters:  01/10/21 210 lb (95.3 kg)  12/20/20 199 lb 1.2 oz (90.3 kg)  10/13/20 216 lb 9.6 oz (98.2 kg)      Studies/Labs Reviewed:   ECHO 10/09/2019:   1. Left ventricular ejection fraction, by estimation, is 60 to 65%. The  left ventricle has normal function. The left ventricle has no regional  wall motion abnormalities. There is moderate concentric left ventricular  hypertrophy. Left ventricular  diastolic parameters are consistent with Grade II diastolic dysfunction  (pseudonormalization). Elevated left ventricular end-diastolic pressure.   2. Right ventricular systolic function is normal. The right ventricular  size is normal.   3. Left atrial size was severely dilated.   4. The mitral valve is normal in structure. Trivial mitral valve  regurgitation. No evidence of mitral stenosis.   5. The aortic valve is normal in structure. Aortic valve regurgitation is  mild. Moderate aortic valve stenosis. Aortic regurgitation PHT measures  520 msec. Aortic valve area, by VTI measures 1.82 cm. Aortic valve mean  gradient measures 25.0 mmHg.  Aortic valve Vmax measures 3.29 m/s.   6. The inferior vena cava is normal in size with greater than 50%  respiratory variability, suggesting right atrial pressure of 3 mmHg.   EKG:  EKG is ordered today.  It shows atrial fibrillation, left ventricular hypertrophy with left axis deviation and mild broadening of QRS at 116 ms (no change), no ischemic repolarization abnormalities, QTC 493 ms Recent Labs: 12/17/2020: ALT 33; B Natriuretic Peptide 362.3 12/18/2020: Magnesium 1.9 12/20/2020: BUN 24; Creatinine, Ser 0.91; Hemoglobin 14.3; Platelets 197; Potassium 3.6; Sodium 140   Lipid Panel    Component Value Date/Time   CHOL 119 04/26/2020 0941   TRIG 120 04/26/2020 0941   HDL 37 (L) 04/26/2020 0941   CHOLHDL 3.2 04/26/2020 0941   CHOLHDL 5.8 (H)  03/10/2016 0830   VLDL 41 (H) 03/10/2016 0830   LDLCALC 60 04/26/2020 0941    ASSESSMENT:    1. Aortic valve stenosis, nonrheumatic   2. Persistent atrial fibrillation ([place])   3. Long term (current) use of anticoagulants   4. Coronary artery disease involving native coronary artery of native heart without angina pectoris   5. Dyslipidemia (high LDL; low HDL)   6. Type 2 diabetes mellitus with complication, without long-term current use of insulin (HCC)   7. Smoking   8. OSA (obstructive sleep apnea)   9. Mild obesity   10. Essential hypertension   11. CLL (chronic lymphocytic leukemia) ([place])   12. 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  Although the gradients on echo are just shy of severe AAS (echo performed in atrial fibrillation), the hemodynamic data mostly suggest severe aortic stenosis and this diagnosis is supported by the markedly elevated valves calcium score.  I believe he has developed symptomatic AAS and he needs to undergo valve replacement.  He would be very high risk for redo sternotomy, but is an adequate candidate for TAVR with a 26 mm S3U Edwards SAPIEN valve.  CT shows that he is probably  appropriate candidate for transfemoral approach.  We went over the logistical details of his ongoing work-up, timing of TAVR, length of hospital stay, possible complications, etc.  He is scheduled for an appoint with Dr. Angelena Form at the end of this week. AFib: Fair ventricular rate control, although not perfect.  On metoprolol.  On warfarin anticoagulation. CHADSVasc 4 (age, HTN, CAD, DM).  He might benefit from cardioversion to normal sinus rhythm, but we would not want to do that in anticipation of upcoming TAVR when we would have to interrupt  anticoagulation. Warfarin:  no bleeding complications.  Usually in therapeutic range.  Relatively low risk for embolic events with brief interruption anticoagulation.  I do not think he will need "bridging" before cardiac catheterization. CAD s/p CABG: He does not have angina pectoris.  CT scan shows that 3 bypass grafts are patent (LIMA to LAD, SVG to diagonal, SVG to OM), but with heavy coronary calcification it is hard to comment on his native arteries.  I suspect that Dr. Angelena Form will recommend that he go ahead with right and left heart cardiac catheterization and coronary angiography. HLP: Excellent LDL.  HDL remains borderline low.  Continue rosuvastatin plus ezetimibe. DM: Good glycemic control.  On metformin monotherapy. Smoking: Congratulated on quitting smoking.  He has now been quit for almost 4 weeks.  He is proud to report that he has been sober from alcohol and drugs for 18 years and still goes to [employer]. Carotid bruits: Vascular studies performed in 2017 showed external carotid artery stenosis.  His bruits are also probably transmitted from the aortic stenosis. OSA: He did not tolerate CPAP. Denies any symptoms of daytime hypersomnolence.   Obesity: He lost about 10 pounds last year, but gained most of it back this year. HTN: Had to stop vasodilators as his aortic stenosis has progressed. CLL: Unchanged white blood cell count around 16,000, not yet requiring treatment. No anemia or thrombocytopenia.  Followed by Dr. Alvy Bimler. Prostate Ca: PSA undetectable on current medications. Required radiation therapy for pelvic lymphadenopathy, with normalization of findings on PET scan.    Medication Adjustments/Labs and Tests Ordered: Current medicines are reviewed at length with the patient today.  Concerns regarding medicines are outlined above.  Medication changes, Labs and Tests ordered today are listed in the Patient Instructions below. Patient Instructions  Medication Instructions:   No changes *If you need a refill on your cardiac medications before your next appointment, please call your pharmacy*   Lab Work: None ordered If you have labs (blood work) drawn today and your tests are completely normal, you will receive your results only by: [place] (if you have MyChart) OR A paper copy in the mail If you have any lab test that is abnormal or we need to change your treatment, we will call you to review the results.   Testing/Procedures: None ordered   Follow-Up: At [facility], you and your health needs are our priority.  As part of our continuing mission to provide you with exceptional heart care, we have created designated Provider Care Teams.  These Care Teams include your primary Cardiologist (physician) and Advanced Practice Providers (APPs -  Physician Assistants and Nurse Practitioners) who all work together to provide you with the care you need, when you need it.  We recommend signing up for the patient portal called "MyChart".  Sign up information is provided on this After Visit Summary.  MyChart is used to connect with patients for Virtual Visits (Telemedicine).  Patients are  able to view lab/test results, encounter notes, upcoming appointments, etc.  Non-urgent messages can be sent to your provider as well.   To learn more about what you can do with MyChart, go to [email or web address].    Your next appointment:   Keep your follow up on 04/29/21 with Dr. Sallyanne Kuster   Signed, Rodney Klein, MD  01/11/2021 7:30 PM    [address], [place], [place]  [address] Phone: [phone]; Fax: [phone]

## 2021-01-11 ENCOUNTER — Encounter: Payer: Self-pay | Admitting: Hematology and Oncology

## 2021-01-11 ENCOUNTER — Ambulatory Visit (HOSPITAL_COMMUNITY): Payer: Medicare Other | Admitting: Licensed Clinical Social Worker

## 2021-01-12 DIAGNOSIS — I5031 Acute diastolic (congestive) heart failure: Secondary | ICD-10-CM | POA: Diagnosis not present

## 2021-01-12 DIAGNOSIS — I480 Paroxysmal atrial fibrillation: Secondary | ICD-10-CM | POA: Diagnosis not present

## 2021-01-12 DIAGNOSIS — C61 Malignant neoplasm of prostate: Secondary | ICD-10-CM | POA: Diagnosis not present

## 2021-01-12 DIAGNOSIS — I110 Hypertensive heart disease with heart failure: Secondary | ICD-10-CM | POA: Diagnosis not present

## 2021-01-12 DIAGNOSIS — J449 Chronic obstructive pulmonary disease, unspecified: Secondary | ICD-10-CM | POA: Diagnosis not present

## 2021-01-12 DIAGNOSIS — I25118 Atherosclerotic heart disease of native coronary artery with other forms of angina pectoris: Secondary | ICD-10-CM | POA: Diagnosis not present

## 2021-01-12 DIAGNOSIS — E119 Type 2 diabetes mellitus without complications: Secondary | ICD-10-CM | POA: Diagnosis not present

## 2021-01-12 NOTE — Telephone Encounter (Signed)
Dr. Erin Fulling, Please see patient comment.  Thank you.

## 2021-01-13 NOTE — Progress Notes (Signed)
[facility] Consult Note  Chief Complaint  Patient presents with   New Patient (Initial Visit)    Severe aortic stenosis   History of Present Illness: 75 yo male with history of CAD s/p 3V CABG in 2006, paroxysmal atrial fibrillation, carotid artery disease, HTN, hyperlipidemia, DM type 2, former etoh abuse (stopped drinking etoh in 2002), former tobacco abuse (stopped July 2022), CLL, sleep apnea and severe aortic stenosis who is here today as a new consult, referred by Dr. Sallyanne Kuster, for further discussion regarding his aortic stenosis and possible TAVR. His aortic stenosis has been moderate and followed by Dr. Sallyanne Kuster. He was hospitalized with pneumonia in July 2022 and since then has had worsening dyspnea on exertion. Echo 12/17/20 with LVEF=60-65%. Trivial mitral regurgitation. The aortic valve is thickened and calcified with limited leaflet excursion. Mean gradient 33.5 mmHg, peak gradient 57.5 mmHg, AVA 0.59 cm2. Dimensionless index 0.16. SVI 27. This is consistent with low flow, low gradient severe aortic stenosis. Cardiac CT with valve calcium score of 3000 consistent with severe aortic stenosis. Patent grafts (LIMA to LAD, SVG to Diagonal and SVG to OM) by CTA. Sizing and anatomy suitable for 26 mm Edwards Sapien 3 valve. CTA suggests marginal femoral access for TAVR given heavy calcification of his iliac and femoral arteries.   He tells me today that he has progressive dyspnea on exertion and chest pressure. He denies LE edema or syncope. He has some exertional dizziness. He has his own teeth and has no active dental issues. He sees a Pharmacist, community regularly. He lives in [place] alone. He is a retired high school history Pharmacist, hospital.   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   Depression    Diabetes mellitus without complication ([place])    Diverticulosis    Eye abnormality    Macular scarring R eye   Glaucoma    Heart murmur    HLD (hyperlipidemia)    Hypertension    Leukemia ([place])    CLL   Myocardial infarction ([place]) 2006   Prostate cancer ([place]) 07/08/2013   [facility] at [facility]- getting Lupron shot every 6 months - traces in prostate and 2 lymphnodes left hip- non focused traces per pt    Sleep apnea    Substance abuse ([place])    Tremor, essential 09/22/2015    Past Surgical History:  Procedure Laterality Date   Arm Surgery Right    from door accident with glass   CATARACT EXTRACTION W/ INTRAOCULAR LENS IMPLANT Bilateral    COLONOSCOPY     CORONARY ARTERY BYPASS GRAFT  08/04/2004   POLYPECTOMY      Current Outpatient Medications  Medication Sig Dispense Refill   Ascorbic Acid (VITAMIN C) 1000 MG tablet Take 1,000 mg by mouth See admin instructions. Take 1 tablet  every other day alternating with '500mg'$  tablet     B Complex Vitamins (VITAMIN-B COMPLEX PO) Take 1 tablet by mouth at bedtime.     BIOTIN PO Take 1 tablet by mouth at bedtime.     buPROPion ER (WELLBUTRIN SR) 100 MG 12 hr tablet 2  qam   1  q diner 270 tablet  1   Calcium Carbonate-Vit D-Min (CALCIUM 1200 PO) Take 1,200 mg by mouth daily.     docusate sodium (COLACE) 100 MG capsule Take 100 mg by mouth.     ezetimibe (ZETIA) 10 MG tablet Take 1 tablet (10 mg total) by mouth daily. 90 tablet 3   furosemide (LASIX) 20 MG tablet Take 1 tablet (20 mg total) by mouth daily. 30 tablet 0   gabapentin (NEURONTIN) 100 MG capsule 3  qhs 270 capsule 1   ketoconazole (NIZORAL) 2 % shampoo Apply 1 application topically 2 (two) times a week. 120 mL 3   latanoprost (XALATAN) 0.005 % ophthalmic solution Place 1 drop into the left eye every other day.     LYCOPENE PO Take 10 mg by mouth every  other day.     Magnesium 250 MG TABS Take 250 mg by mouth every 3 (three) days.     metFORMIN (GLUCOPHAGE) 500 MG tablet Take 1 tablet (500 mg total) by mouth 2 (two) times daily with a meal. 180 tablet 2   metoprolol (TOPROL-XL) 50 MG 24 hr tablet Take 1 tablet (50 mg total) by mouth 2 (two) times daily. Take with or immediately following a meal. 30 tablet 0   Omega-3 Fatty Acids (FISH OIL) 1000 MG CAPS Take 1,000 mg by mouth every other day.     rosuvastatin (CRESTOR) 20 MG tablet Take 1 tablet (20 mg total) by mouth daily. 90 tablet 3   Sodium Fluoride (PREVIDENT 5000 DRY MOUTH DT) Place 1 application onto teeth See admin instructions. Apply to gums every night for dry mouth     vitamin B-12 (CYANOCOBALAMIN) 500 MCG tablet Take 500 mcg by mouth every other day.     vitamin C (ASCORBIC ACID) 500 MG tablet Take 500 mg by mouth daily. Take 1 tablet every other day alternating with '1000mg'$      Vitamin D, Ergocalciferol, (DRISDOL) 1.25 MG (50000 UNIT) CAPS capsule Take 50,000 Units by mouth every Saturday.     warfarin (COUMADIN) 4 MG tablet TAKE 1/2 TO 1 TABLET DAILY AS DIRECTED BY COUMADIN CLINIC 90 tablet 1   Current Facility-Administered Medications  Medication Dose Route Frequency Provider Last Rate Last Admin   0.9 %  sodium chloride infusion  500 mL Intravenous Once Danis, Kirke Corin, MD        Allergies  Allergen Reactions   Other Rash    Allergen: "Plants and bushes while doing yard work"    Social History   Socioeconomic History   Marital status: Divorced    Spouse name: Not on file   Number of children: 2   Years of education: 16   Highest education level: Not on file  Occupational History   Occupation: Retired Pharmacist, hospital  Tobacco Use   Smoking status: Former    Packs/day: 0.50    Years: 50.00    Pack years: 25.00    Types: Cigarettes    Quit date: 12/16/2020    Years since quitting: 0.0   Smokeless tobacco: Never  Vaping Use   Vaping Use: Never used  Substance and  Sexual Activity   Alcohol use: No    Alcohol/week: 0.0 standard drinks   Drug use: No   Sexual activity: Not Currently  Other Topics Concern   Not on file  Social History Narrative   Lives at home w/ his father   Divorced   Right-handed   Education: Secretary/administrator   No caffeine   Social Determinants of Engineer, drilling  Resource Strain: Not on file  Food Insecurity: Not on file  Transportation Needs: Not on file  Physical Activity: Not on file  Stress: Not on file  Social Connections: Not on file  Intimate Partner Violence: Not on file    Family History  Problem Relation Age of Onset   Colon cancer Mother    Ovarian cancer Mother    Uterine cancer Mother    Hypertension Father    Prostate cancer Father    Colon polyps Neg Hx     Review of Systems:  As stated in the HPI and otherwise negative.   BP 126/60   Pulse 68   Ht 5' 8.5" (1.74 m)   Wt 209 lb 9.6 oz (95.1 kg)   SpO2 96%   BMI 31.41 kg/m   Physical Examination: General: Well developed, well nourished, NAD  HEENT: OP clear, mucus membranes moist  SKIN: warm, dry. No rashes. Neuro: No focal deficits  Musculoskeletal: Muscle strength 5/5 all ext  Psychiatric: Mood and affect normal  Neck: No JVD, no carotid bruits, no thyromegaly, no lymphadenopathy.  Lungs:Clear bilaterally, no wheezes, rhonci, crackles Cardiovascular: Irregular irregular. Loud, harsh, late peaking systolic murmur.  Abdomen:Soft. Bowel sounds present. Non-tender.  Extremities: No lower extremity edema. Pulses are 2 + in the bilateral DP/PT.  EKG:  EKG is not ordered today. The ekg from 12/20/20 shows atrial flutter with LVH.    Echo 12/17/20:  1. Left ventricular ejection fraction, by estimation, is 60 to 65%. The  left ventricle has normal function. The left ventricle has no regional  wall motion abnormalities. Left ventricular diastolic parameters are  indeterminate.   2. Right ventricular systolic function is normal. The right  ventricular  size is normal. There is normal pulmonary artery systolic pressure.   3. Left atrial size was severely dilated.   4. Predominantly posterior mitral annular calcification. The mitral valve  is normal in structure. Trivial mitral valve regurgitation. No evidence of  mitral stenosis. Moderate mitral annular calcification.   5. Compared with the echo 04/2020, mean aortic valve gradient has  increased from 25 mmHg to 33 mmHg. The aortic valve is normal in  structure. There is moderate calcification of the aortic valve. There is  moderate thickening of the aortic valve. Aortic  valve regurgitation is mild. Moderate aortic valve stenosis. Aortic  regurgitation PHT measures 428 msec. Aortic valve mean gradient measures  33.5 mmHg. Aortic valve Vmax measures 3.79 m/s.   6. Aortic dilatation noted. There is mild dilatation of the ascending  aorta, measuring 37 mm.   7. The inferior vena cava is dilated in size with >50% respiratory  variability, suggesting right atrial pressure of 8 mmHg.   FINDINGS   Left Ventricle: Left ventricular ejection fraction, by estimation, is 60  to 65%. The left ventricle has normal function. The left ventricle has no  regional wall motion abnormalities. The left ventricular internal cavity  size was normal in size. There is   no left ventricular hypertrophy. Left ventricular diastolic parameters  are indeterminate.   Right Ventricle: The right ventricular size is normal. No increase in  right ventricular wall thickness. Right ventricular systolic function is  normal. There is normal pulmonary artery systolic pressure. The tricuspid  regurgitant velocity is 2.39 m/s, and   with an assumed right atrial pressure of 8 mmHg, the estimated right  ventricular systolic pressure is [id number] mmHg.   Left Atrium: Left atrial size was severely dilated.   Right Atrium: Right atrial  size was normal in size.   Pericardium: There is no evidence of pericardial  effusion.   Mitral Valve: Predominantly posterior mitral annular calcification. The  mitral valve is normal in structure. Moderate mitral annular  calcification. Trivial mitral valve regurgitation. No evidence of mitral  valve stenosis.   Tricuspid Valve: The tricuspid valve is normal in structure. Tricuspid  valve regurgitation is trivial. No evidence of tricuspid stenosis.   Aortic Valve: Compared with the echo 04/2020, mean aortic valve gradient  has increased from 25 mmHg to 33 mmHg. The aortic valve is normal in  structure. There is moderate calcification of the aortic valve. There is  moderate thickening of the aortic  valve. Aortic valve regurgitation is mild. Aortic regurgitation PHT  measures 428 msec. Moderate aortic stenosis is present. Aortic valve mean  gradient measures 33.5 mmHg. Aortic valve peak gradient measures 57.5  mmHg. Aortic valve area, by VTI measures  0.59 cm.   Pulmonic Valve: The pulmonic valve was normal in structure. Pulmonic valve  regurgitation is not visualized. No evidence of pulmonic stenosis.   Aorta: Aortic dilatation noted. There is mild dilatation of the ascending  aorta, measuring 37 mm.   Venous: The inferior vena cava is dilated in size with greater than 50%  respiratory variability, suggesting right atrial pressure of 8 mmHg.   IAS/Shunts: No atrial level shunt detected by color flow Doppler.      LEFT VENTRICLE  PLAX 2D  LVIDd:         4.30 cm  LVIDs:         2.40 cm  LV PW:         1.40 cm  LV IVS:        1.40 cm  LVOT diam:     2.20 cm  LV SV:         58  LV SV Index:   27  LVOT Area:     3.80 cm      RIGHT VENTRICLE  TAPSE (M-mode): 1.5 cm   LEFT ATRIUM             Index       RIGHT ATRIUM           Index  LA diam:        5.30 cm 2.51 cm/m  RA Area:     19.40 cm  LA Vol (A2C):   89.0 ml 42.15 ml/m RA Volume:   51.20 ml  24.25 ml/m  LA Vol (A4C):   80.3 ml 38.03 ml/m  LA Biplane Vol: 93.9 ml 44.47 ml/m   AORTIC  VALVE  AV Area (Vmax):    0.67 cm  AV Area (Vmean):   0.71 cm  AV Area (VTI):     0.59 cm  AV Vmax:           379.00 cm/s  AV Vmean:          263.500 cm/s  AV VTI:            0.979 m  AV Peak Grad:      57.5 mmHg  AV Mean Grad:      33.5 mmHg  LVOT Vmax:         66.75 cm/s  LVOT Vmean:        49.100 cm/s  LVOT VTI:          0.152 m  LVOT/AV VTI ratio: 0.16  AI PHT:  428 msec     AORTA  Ao Root diam: 3.60 cm  Ao Asc diam:  3.70 cm   TRICUSPID VALVE  TR Peak grad:   22.8 mmHg  TR Vmax:        239.00 cm/s     SHUNTS  Systemic VTI:  0.15 m  Systemic Diam: 2.20 cm   Cardiac CT 12/30/20: FINDINGS: Aortic Valve: Tri leaflet calcified with restricted leaflet motion calcium score 3420   Aorta: No aneurysm, severe calcific atherosclerosis normal arch vessels   Sinotubular Junction: 29 mm   Ascending Thoracic Aorta: 35 mm   Aortic Arch: 31 mm   Descending Thoracic Aorta: 27 mm   Sinus of Valsalva Measurements:   Non-coronary: 33.4 mm   Right - coronary: 30.1 mm   Left - coronary: 33.4 mm   Coronary Artery Height above Annulus:   Left Main: 14.3 mm above annulus   Right Coronary: 18.9 mm above annulus   Virtual Basal Annulus Measurements:   Maximum/Minimum Diameter: 22.2 mm x 26.8 mm   Perimeter: 78 mm   Area: 451 mm 2   Coronary Arteries: Likely left dominant Patent SVG to distal OM, Patent SVG to Diagonal Patent LIMA to LAD   Optimum Fluoroscopic Angle for Delivery: LAO 18 Caudal 6 degrees   IMPRESSION: 1.  Tri leaflet AV calcified with score 3420   2. Optimum angiographic angle for deployment LAO 18 Caudal 6 degrees   3. Left dominant native coronary arteries Patent SVG to distal OM and Diagonal Patent LIMA to LAD   4. Annular area of 451 mm2 suitable for a 26 mm Sapien 3 valve large bulky calcium noted at commissure between left and non cusps   5. Coronary arteries sufficient height above annulus for deployment with patent grafts    6. Normal aortic root 3.5 cm with severe calcific atherosclerosis and normal arch vessels   7.  Severe posterior annular mitral calcification  CTA chest/abdomen/pelvis: 12/30/20: CTA CHEST FINDINGS   Cardiovascular: Heart size is enlarged with left atrial dilatation. There is no significant pericardial fluid, thickening or pericardial calcification. There is aortic atherosclerosis, as well as atherosclerosis of the great vessels of the mediastinum and the coronary arteries, including calcified atherosclerotic plaque in the left main, left anterior descending, left circumflex and right coronary arteries. Status post median sternotomy for CABG including LIMA to the LAD. Severe thickening and calcification of the aortic valve. Severe calcifications of the mitral annulus.   Mediastinum/Lymph Nodes: No pathologically enlarged mediastinal or hilar lymph nodes. Esophagus is unremarkable in appearance. No axillary lymphadenopathy.   Lungs/Pleura: Small bilateral pleural effusions lying dependently. No acute consolidative airspace disease. No suspicious appearing pulmonary nodules or masses are noted.   Musculoskeletal/Soft Tissues: Median sternotomy wires. There are no aggressive appearing lytic or blastic lesions noted in the visualized portions of the skeleton.   CTA ABDOMEN AND PELVIS FINDINGS   Hepatobiliary: No suspicious cystic or solid hepatic lesions. No intra or extrahepatic biliary ductal dilatation. Gallbladder is normal in appearance.   Pancreas: No pancreatic mass. No pancreatic ductal dilatation. No pancreatic or peripancreatic fluid collections or inflammatory changes.   Spleen: Unremarkable.   Adrenals/Urinary Tract: Several low-attenuation lesions in both kidneys compatible with simple cysts measuring up to 1.6 cm in diameter in the lower pole of the right kidney and 1.8 cm in diameter in the lower pole the left kidney. Other subcentimeter low-attenuation  lesions are also noted in both kidneys, too small to characterize, but statistically likely to  represent tiny cysts. No hydroureteronephrosis. Urinary bladder is normal in appearance. Bilateral adrenal glands are normal in appearance.   Stomach/Bowel: The appearance of the stomach is normal. No pathologic dilatation of small bowel or colon. Numerous colonic diverticulae are noted, without surrounding inflammatory changes to suggest an acute diverticulitis at this time. Normal appendix.   Vascular/Lymphatic: Aortic atherosclerosis with vascular findings and measurements pertinent to potential TAVR procedure, as detailed below. 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 No lymphadenopathy noted in the abdomen or pelvis.   Reproductive: Prostate gland and seminal vesicles are unremarkable in appearance.   Other: No significant volume of ascites.  No pneumoperitoneum.   Musculoskeletal: There are no aggressive appearing lytic or blastic lesions noted in the visualized portions of the skeleton.   VASCULAR MEASUREMENTS PERTINENT TO TAVR:   AORTA:   Minimal Aortic Diameter-16 x 11 mm   Severity of Aortic Calcification-severe   RIGHT PELVIS:   Right Common Iliac Artery -   Minimal Diameter-7.7 x 6.0 mm   Tortuosity-mild   Calcification-severe   Right External Iliac Artery -   Minimal Diameter-6.1 x 4.3 mm   Tortuosity - mild   Calcification - severe   Right Common Femoral Artery -   Minimal Diameter-5.8 x 3.6 mm   Tortuosity - mild   Calcification - severe   LEFT PELVIS:   Left Common Iliac Artery -   Minimal Diameter-7.4 x 6.7 mm   Tortuosity - mild   Calcification - severe   Left External Iliac Artery -   Minimal Diameter-5.3 x 4.3 mm   Tortuosity - mild   Calcification - severe   Left Common Femoral Artery -   Minimal Diameter-5.7 x 4.5 mm   Tortuosity - mild   Calcification -  severe   Review of the MIP images confirms the above findings.   IMPRESSION: 1. Vascular findings and measurements pertinent to potential TAVR procedure, as detailed above. 2. Severe thickening calcification of the aortic valve, compatible with reported clinical history of severe aortic stenosis. 3. Aortic atherosclerosis, in addition to left main and 3 vessel coronary artery disease. Status post median sternotomy for CABG including LIMA to the LAD. 4. Small bilateral pleural effusions lying dependently. 5. Colonic diverticulosis without evidence of acute diverticulitis at this time. 6. Additional incidental findings, as above   Recent Labs: 12/17/2020: ALT 33; B Natriuretic Peptide 362.3 12/18/2020: Magnesium 1.9 12/20/2020: BUN 24; Creatinine, Ser 0.91; Hemoglobin 14.3; Platelets 197; Potassium 3.6; Sodium 140    Wt Readings from Last 3 Encounters:  01/14/21 209 lb 9.6 oz (95.1 kg)  01/10/21 210 lb (95.3 kg)  12/20/20 199 lb 1.2 oz (90.3 kg)     Other studies Reviewed: Additional studies/ records that were reviewed today include: office notes, CT scans, EKG, echo Review of the above records demonstrates: low flow, low gradient severe AS  STS Risk Score: Risk of Mortality: 1.363% Renal Failure: 1.810% Permanent Stroke: 0.801% Prolonged Ventilation: 8.281% DSW Infection: 0.234% Reoperation: 3.088% Morbidity or Mortality: 11.386% Short Length of Stay: 40.334% Long Length of Stay: 5.348%   Assessment and Plan:   1. Severe Aortic Valve Stenosis: He has severe, stage D aortic valve stenosis. I have personally reviewed the echo images. The aortic valve is thickened, calcified with limited leaflet mobility. I think he would benefit from AVR. Given advanced age and prior open chest procedure, he is not a good candidate for conventional AVR by surgical approach.  I think he may be a good candidate for TAVR.   I have reviewed the natural history of aortic stenosis with the  patient and their family members  who are present today. We have discussed the limitations of medical therapy and the poor prognosis associated with symptomatic aortic stenosis. We have reviewed potential treatment options, including palliative medical therapy, conventional surgical aortic valve replacement, and transcatheter aortic valve replacement. We discussed treatment options in the context of the patient's specific comorbid medical conditions.   He would like to proceed with planning for TAVR. I will arrange a right and left heart catheterization at [facility] 01/20/21 at 9am. Risks and benefits of the cath procedure and the valve procedure are reviewed with the patient. After the cath, he will have carotid artery dopplers, PT assessment and will then be referred to see Dr. Cyndia Bent in [place].    His TAVR will likely need to be from alternative access as his femoral arteries are heavily calcified and diseased and my prohibit sheath insertion/valve delivery.   BMET and CBC today. He will hold coumadin 5 days before his procedure.      Current medicines are reviewed at length with the patient today.  The patient does not have concerns regarding medicines.  The following changes have been made:  no change  Labs/ tests ordered today include:   Orders Placed This Encounter  Procedures   Basic metabolic panel   CBC      Disposition:   F/U with the valve team.   Signed, Lauree Chandler, MD 01/14/2021 9:26 AM    [address], [place], [place]  [id number] Phone: [phone]; Fax: [phone]

## 2021-01-13 NOTE — H&P (View-Only) (Signed)
[facility] Consult Note  Chief Complaint  Patient presents with   New Patient (Initial Visit)    Severe aortic stenosis   History of Present Illness: 75 yo male with history of CAD s/p 3V CABG in 2006, paroxysmal atrial fibrillation, carotid artery disease, HTN, hyperlipidemia, DM type 2, former etoh abuse (stopped drinking etoh in 2002), former tobacco abuse (stopped July 2022), CLL, sleep apnea and severe aortic stenosis who is here today as a new consult, referred by Dr. Sallyanne Kuster, for further discussion regarding his aortic stenosis and possible TAVR. His aortic stenosis has been moderate and followed by Dr. Sallyanne Kuster. He was hospitalized with pneumonia in July 2022 and since then has had worsening dyspnea on exertion. Echo 12/17/20 with LVEF=60-65%. Trivial mitral regurgitation. The aortic valve is thickened and calcified with limited leaflet excursion. Mean gradient 33.5 mmHg, peak gradient 57.5 mmHg, AVA 0.59 cm2. Dimensionless index 0.16. SVI 27. This is consistent with low flow, low gradient severe aortic stenosis. Cardiac CT with valve calcium score of 3000 consistent with severe aortic stenosis. Patent grafts (LIMA to LAD, SVG to Diagonal and SVG to OM) by CTA. Sizing and anatomy suitable for 26 mm Edwards Sapien 3 valve. CTA suggests marginal femoral access for TAVR given heavy calcification of his iliac and femoral arteries.   He tells me today that he has progressive dyspnea on exertion and chest pressure. He denies LE edema or syncope. He has some exertional dizziness. He has his own teeth and has no active dental issues. He sees a Pharmacist, community regularly. He lives in [place] alone. He is a retired high school history Pharmacist, hospital.   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   Depression    Diabetes mellitus without complication ([place])    Diverticulosis    Eye abnormality    Macular scarring R eye   Glaucoma    Heart murmur    HLD (hyperlipidemia)    Hypertension    Leukemia ([place])    CLL   Myocardial infarction ([place]) 2006   Prostate cancer ([place]) 07/08/2013   [facility] at [facility]- getting Lupron shot every 6 months - traces in prostate and 2 lymphnodes left hip- non focused traces per pt    Sleep apnea    Substance abuse ([place])    Tremor, essential 09/22/2015    Past Surgical History:  Procedure Laterality Date   Arm Surgery Right    from door accident with glass   CATARACT EXTRACTION W/ INTRAOCULAR LENS IMPLANT Bilateral    COLONOSCOPY     CORONARY ARTERY BYPASS GRAFT  08/04/2004   POLYPECTOMY      Current Outpatient Medications  Medication Sig Dispense Refill   Ascorbic Acid (VITAMIN C) 1000 MG tablet Take 1,000 mg by mouth See admin instructions. Take 1 tablet  every other day alternating with '500mg'$  tablet     B Complex Vitamins (VITAMIN-B COMPLEX PO) Take 1 tablet by mouth at bedtime.     BIOTIN PO Take 1 tablet by mouth at bedtime.     buPROPion ER (WELLBUTRIN SR) 100 MG 12 hr tablet 2  qam   1  q diner 270 tablet  1   Calcium Carbonate-Vit D-Min (CALCIUM 1200 PO) Take 1,200 mg by mouth daily.     docusate sodium (COLACE) 100 MG capsule Take 100 mg by mouth.     ezetimibe (ZETIA) 10 MG tablet Take 1 tablet (10 mg total) by mouth daily. 90 tablet 3   furosemide (LASIX) 20 MG tablet Take 1 tablet (20 mg total) by mouth daily. 30 tablet 0   gabapentin (NEURONTIN) 100 MG capsule 3  qhs 270 capsule 1   ketoconazole (NIZORAL) 2 % shampoo Apply 1 application topically 2 (two) times a week. 120 mL 3   latanoprost (XALATAN) 0.005 % ophthalmic solution Place 1 drop into the left eye every other day.     LYCOPENE PO Take 10 mg by mouth every  other day.     Magnesium 250 MG TABS Take 250 mg by mouth every 3 (three) days.     metFORMIN (GLUCOPHAGE) 500 MG tablet Take 1 tablet (500 mg total) by mouth 2 (two) times daily with a meal. 180 tablet 2   metoprolol (TOPROL-XL) 50 MG 24 hr tablet Take 1 tablet (50 mg total) by mouth 2 (two) times daily. Take with or immediately following a meal. 30 tablet 0   Omega-3 Fatty Acids (FISH OIL) 1000 MG CAPS Take 1,000 mg by mouth every other day.     rosuvastatin (CRESTOR) 20 MG tablet Take 1 tablet (20 mg total) by mouth daily. 90 tablet 3   Sodium Fluoride (PREVIDENT 5000 DRY MOUTH DT) Place 1 application onto teeth See admin instructions. Apply to gums every night for dry mouth     vitamin B-12 (CYANOCOBALAMIN) 500 MCG tablet Take 500 mcg by mouth every other day.     vitamin C (ASCORBIC ACID) 500 MG tablet Take 500 mg by mouth daily. Take 1 tablet every other day alternating with '1000mg'$      Vitamin D, Ergocalciferol, (DRISDOL) 1.25 MG (50000 UNIT) CAPS capsule Take 50,000 Units by mouth every Saturday.     warfarin (COUMADIN) 4 MG tablet TAKE 1/2 TO 1 TABLET DAILY AS DIRECTED BY COUMADIN CLINIC 90 tablet 1   Current Facility-Administered Medications  Medication Dose Route Frequency Provider Last Rate Last Admin   0.9 %  sodium chloride infusion  500 mL Intravenous Once Danis, Kirke Corin, MD        Allergies  Allergen Reactions   Other Rash    Allergen: "Plants and bushes while doing yard work"    Social History   Socioeconomic History   Marital status: Divorced    Spouse name: Not on file   Number of children: 2   Years of education: 16   Highest education level: Not on file  Occupational History   Occupation: Retired Pharmacist, hospital  Tobacco Use   Smoking status: Former    Packs/day: 0.50    Years: 50.00    Pack years: 25.00    Types: Cigarettes    Quit date: 12/16/2020    Years since quitting: 0.0   Smokeless tobacco: Never  Vaping Use   Vaping Use: Never used  Substance and  Sexual Activity   Alcohol use: No    Alcohol/week: 0.0 standard drinks   Drug use: No   Sexual activity: Not Currently  Other Topics Concern   Not on file  Social History Narrative   Lives at home w/ his father   Divorced   Right-handed   Education: Secretary/administrator   No caffeine   Social Determinants of Engineer, drilling  Resource Strain: Not on file  Food Insecurity: Not on file  Transportation Needs: Not on file  Physical Activity: Not on file  Stress: Not on file  Social Connections: Not on file  Intimate Partner Violence: Not on file    Family History  Problem Relation Age of Onset   Colon cancer Mother    Ovarian cancer Mother    Uterine cancer Mother    Hypertension Father    Prostate cancer Father    Colon polyps Neg Hx     Review of Systems:  As stated in the HPI and otherwise negative.   BP 126/60   Pulse 68   Ht 5' 8.5" (1.74 m)   Wt 209 lb 9.6 oz (95.1 kg)   SpO2 96%   BMI 31.41 kg/m   Physical Examination: General: Well developed, well nourished, NAD  HEENT: OP clear, mucus membranes moist  SKIN: warm, dry. No rashes. Neuro: No focal deficits  Musculoskeletal: Muscle strength 5/5 all ext  Psychiatric: Mood and affect normal  Neck: No JVD, no carotid bruits, no thyromegaly, no lymphadenopathy.  Lungs:Clear bilaterally, no wheezes, rhonci, crackles Cardiovascular: Irregular irregular. Loud, harsh, late peaking systolic murmur.  Abdomen:Soft. Bowel sounds present. Non-tender.  Extremities: No lower extremity edema. Pulses are 2 + in the bilateral DP/PT.  EKG:  EKG is not ordered today. The ekg from 12/20/20 shows atrial flutter with LVH.    Echo 12/17/20:  1. Left ventricular ejection fraction, by estimation, is 60 to 65%. The  left ventricle has normal function. The left ventricle has no regional  wall motion abnormalities. Left ventricular diastolic parameters are  indeterminate.   2. Right ventricular systolic function is normal. The right  ventricular  size is normal. There is normal pulmonary artery systolic pressure.   3. Left atrial size was severely dilated.   4. Predominantly posterior mitral annular calcification. The mitral valve  is normal in structure. Trivial mitral valve regurgitation. No evidence of  mitral stenosis. Moderate mitral annular calcification.   5. Compared with the echo 04/2020, mean aortic valve gradient has  increased from 25 mmHg to 33 mmHg. The aortic valve is normal in  structure. There is moderate calcification of the aortic valve. There is  moderate thickening of the aortic valve. Aortic  valve regurgitation is mild. Moderate aortic valve stenosis. Aortic  regurgitation PHT measures 428 msec. Aortic valve mean gradient measures  33.5 mmHg. Aortic valve Vmax measures 3.79 m/s.   6. Aortic dilatation noted. There is mild dilatation of the ascending  aorta, measuring 37 mm.   7. The inferior vena cava is dilated in size with >50% respiratory  variability, suggesting right atrial pressure of 8 mmHg.   FINDINGS   Left Ventricle: Left ventricular ejection fraction, by estimation, is 60  to 65%. The left ventricle has normal function. The left ventricle has no  regional wall motion abnormalities. The left ventricular internal cavity  size was normal in size. There is   no left ventricular hypertrophy. Left ventricular diastolic parameters  are indeterminate.   Right Ventricle: The right ventricular size is normal. No increase in  right ventricular wall thickness. Right ventricular systolic function is  normal. There is normal pulmonary artery systolic pressure. The tricuspid  regurgitant velocity is 2.39 m/s, and   with an assumed right atrial pressure of 8 mmHg, the estimated right  ventricular systolic pressure is [id number] mmHg.   Left Atrium: Left atrial size was severely dilated.   Right Atrium: Right atrial  size was normal in size.   Pericardium: There is no evidence of pericardial  effusion.   Mitral Valve: Predominantly posterior mitral annular calcification. The  mitral valve is normal in structure. Moderate mitral annular  calcification. Trivial mitral valve regurgitation. No evidence of mitral  valve stenosis.   Tricuspid Valve: The tricuspid valve is normal in structure. Tricuspid  valve regurgitation is trivial. No evidence of tricuspid stenosis.   Aortic Valve: Compared with the echo 04/2020, mean aortic valve gradient  has increased from 25 mmHg to 33 mmHg. The aortic valve is normal in  structure. There is moderate calcification of the aortic valve. There is  moderate thickening of the aortic  valve. Aortic valve regurgitation is mild. Aortic regurgitation PHT  measures 428 msec. Moderate aortic stenosis is present. Aortic valve mean  gradient measures 33.5 mmHg. Aortic valve peak gradient measures 57.5  mmHg. Aortic valve area, by VTI measures  0.59 cm.   Pulmonic Valve: The pulmonic valve was normal in structure. Pulmonic valve  regurgitation is not visualized. No evidence of pulmonic stenosis.   Aorta: Aortic dilatation noted. There is mild dilatation of the ascending  aorta, measuring 37 mm.   Venous: The inferior vena cava is dilated in size with greater than 50%  respiratory variability, suggesting right atrial pressure of 8 mmHg.   IAS/Shunts: No atrial level shunt detected by color flow Doppler.      LEFT VENTRICLE  PLAX 2D  LVIDd:         4.30 cm  LVIDs:         2.40 cm  LV PW:         1.40 cm  LV IVS:        1.40 cm  LVOT diam:     2.20 cm  LV SV:         58  LV SV Index:   27  LVOT Area:     3.80 cm      RIGHT VENTRICLE  TAPSE (M-mode): 1.5 cm   LEFT ATRIUM             Index       RIGHT ATRIUM           Index  LA diam:        5.30 cm 2.51 cm/m  RA Area:     19.40 cm  LA Vol (A2C):   89.0 ml 42.15 ml/m RA Volume:   51.20 ml  24.25 ml/m  LA Vol (A4C):   80.3 ml 38.03 ml/m  LA Biplane Vol: 93.9 ml 44.47 ml/m   AORTIC  VALVE  AV Area (Vmax):    0.67 cm  AV Area (Vmean):   0.71 cm  AV Area (VTI):     0.59 cm  AV Vmax:           379.00 cm/s  AV Vmean:          263.500 cm/s  AV VTI:            0.979 m  AV Peak Grad:      57.5 mmHg  AV Mean Grad:      33.5 mmHg  LVOT Vmax:         66.75 cm/s  LVOT Vmean:        49.100 cm/s  LVOT VTI:          0.152 m  LVOT/AV VTI ratio: 0.16  AI PHT:  428 msec     AORTA  Ao Root diam: 3.60 cm  Ao Asc diam:  3.70 cm   TRICUSPID VALVE  TR Peak grad:   22.8 mmHg  TR Vmax:        239.00 cm/s     SHUNTS  Systemic VTI:  0.15 m  Systemic Diam: 2.20 cm   Cardiac CT 12/30/20: FINDINGS: Aortic Valve: Tri leaflet calcified with restricted leaflet motion calcium score 3420   Aorta: No aneurysm, severe calcific atherosclerosis normal arch vessels   Sinotubular Junction: 29 mm   Ascending Thoracic Aorta: 35 mm   Aortic Arch: 31 mm   Descending Thoracic Aorta: 27 mm   Sinus of Valsalva Measurements:   Non-coronary: 33.4 mm   Right - coronary: 30.1 mm   Left - coronary: 33.4 mm   Coronary Artery Height above Annulus:   Left Main: 14.3 mm above annulus   Right Coronary: 18.9 mm above annulus   Virtual Basal Annulus Measurements:   Maximum/Minimum Diameter: 22.2 mm x 26.8 mm   Perimeter: 78 mm   Area: 451 mm 2   Coronary Arteries: Likely left dominant Patent SVG to distal OM, Patent SVG to Diagonal Patent LIMA to LAD   Optimum Fluoroscopic Angle for Delivery: LAO 18 Caudal 6 degrees   IMPRESSION: 1.  Tri leaflet AV calcified with score 3420   2. Optimum angiographic angle for deployment LAO 18 Caudal 6 degrees   3. Left dominant native coronary arteries Patent SVG to distal OM and Diagonal Patent LIMA to LAD   4. Annular area of 451 mm2 suitable for a 26 mm Sapien 3 valve large bulky calcium noted at commissure between left and non cusps   5. Coronary arteries sufficient height above annulus for deployment with patent grafts    6. Normal aortic root 3.5 cm with severe calcific atherosclerosis and normal arch vessels   7.  Severe posterior annular mitral calcification  CTA chest/abdomen/pelvis: 12/30/20: CTA CHEST FINDINGS   Cardiovascular: Heart size is enlarged with left atrial dilatation. There is no significant pericardial fluid, thickening or pericardial calcification. There is aortic atherosclerosis, as well as atherosclerosis of the great vessels of the mediastinum and the coronary arteries, including calcified atherosclerotic plaque in the left main, left anterior descending, left circumflex and right coronary arteries. Status post median sternotomy for CABG including LIMA to the LAD. Severe thickening and calcification of the aortic valve. Severe calcifications of the mitral annulus.   Mediastinum/Lymph Nodes: No pathologically enlarged mediastinal or hilar lymph nodes. Esophagus is unremarkable in appearance. No axillary lymphadenopathy.   Lungs/Pleura: Small bilateral pleural effusions lying dependently. No acute consolidative airspace disease. No suspicious appearing pulmonary nodules or masses are noted.   Musculoskeletal/Soft Tissues: Median sternotomy wires. There are no aggressive appearing lytic or blastic lesions noted in the visualized portions of the skeleton.   CTA ABDOMEN AND PELVIS FINDINGS   Hepatobiliary: No suspicious cystic or solid hepatic lesions. No intra or extrahepatic biliary ductal dilatation. Gallbladder is normal in appearance.   Pancreas: No pancreatic mass. No pancreatic ductal dilatation. No pancreatic or peripancreatic fluid collections or inflammatory changes.   Spleen: Unremarkable.   Adrenals/Urinary Tract: Several low-attenuation lesions in both kidneys compatible with simple cysts measuring up to 1.6 cm in diameter in the lower pole of the right kidney and 1.8 cm in diameter in the lower pole the left kidney. Other subcentimeter low-attenuation  lesions are also noted in both kidneys, too small to characterize, but statistically likely to  represent tiny cysts. No hydroureteronephrosis. Urinary bladder is normal in appearance. Bilateral adrenal glands are normal in appearance.   Stomach/Bowel: The appearance of the stomach is normal. No pathologic dilatation of small bowel or colon. Numerous colonic diverticulae are noted, without surrounding inflammatory changes to suggest an acute diverticulitis at this time. Normal appendix.   Vascular/Lymphatic: Aortic atherosclerosis with vascular findings and measurements pertinent to potential TAVR procedure, as detailed below. 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 No lymphadenopathy noted in the abdomen or pelvis.   Reproductive: Prostate gland and seminal vesicles are unremarkable in appearance.   Other: No significant volume of ascites.  No pneumoperitoneum.   Musculoskeletal: There are no aggressive appearing lytic or blastic lesions noted in the visualized portions of the skeleton.   VASCULAR MEASUREMENTS PERTINENT TO TAVR:   AORTA:   Minimal Aortic Diameter-16 x 11 mm   Severity of Aortic Calcification-severe   RIGHT PELVIS:   Right Common Iliac Artery -   Minimal Diameter-7.7 x 6.0 mm   Tortuosity-mild   Calcification-severe   Right External Iliac Artery -   Minimal Diameter-6.1 x 4.3 mm   Tortuosity - mild   Calcification - severe   Right Common Femoral Artery -   Minimal Diameter-5.8 x 3.6 mm   Tortuosity - mild   Calcification - severe   LEFT PELVIS:   Left Common Iliac Artery -   Minimal Diameter-7.4 x 6.7 mm   Tortuosity - mild   Calcification - severe   Left External Iliac Artery -   Minimal Diameter-5.3 x 4.3 mm   Tortuosity - mild   Calcification - severe   Left Common Femoral Artery -   Minimal Diameter-5.7 x 4.5 mm   Tortuosity - mild   Calcification -  severe   Review of the MIP images confirms the above findings.   IMPRESSION: 1. Vascular findings and measurements pertinent to potential TAVR procedure, as detailed above. 2. Severe thickening calcification of the aortic valve, compatible with reported clinical history of severe aortic stenosis. 3. Aortic atherosclerosis, in addition to left main and 3 vessel coronary artery disease. Status post median sternotomy for CABG including LIMA to the LAD. 4. Small bilateral pleural effusions lying dependently. 5. Colonic diverticulosis without evidence of acute diverticulitis at this time. 6. Additional incidental findings, as above   Recent Labs: 12/17/2020: ALT 33; B Natriuretic Peptide 362.3 12/18/2020: Magnesium 1.9 12/20/2020: BUN 24; Creatinine, Ser 0.91; Hemoglobin 14.3; Platelets 197; Potassium 3.6; Sodium 140    Wt Readings from Last 3 Encounters:  01/14/21 209 lb 9.6 oz (95.1 kg)  01/10/21 210 lb (95.3 kg)  12/20/20 199 lb 1.2 oz (90.3 kg)     Other studies Reviewed: Additional studies/ records that were reviewed today include: office notes, CT scans, EKG, echo Review of the above records demonstrates: low flow, low gradient severe AS  STS Risk Score: Risk of Mortality: 1.363% Renal Failure: 1.810% Permanent Stroke: 0.801% Prolonged Ventilation: 8.281% DSW Infection: 0.234% Reoperation: 3.088% Morbidity or Mortality: 11.386% Short Length of Stay: 40.334% Long Length of Stay: 5.348%   Assessment and Plan:   1. Severe Aortic Valve Stenosis: He has severe, stage D aortic valve stenosis. I have personally reviewed the echo images. The aortic valve is thickened, calcified with limited leaflet mobility. I think he would benefit from AVR. Given advanced age and prior open chest procedure, he is not a good candidate for conventional AVR by surgical approach.  I think he may be a good candidate for TAVR.   I have reviewed the natural history of aortic stenosis with the  patient and their family members  who are present today. We have discussed the limitations of medical therapy and the poor prognosis associated with symptomatic aortic stenosis. We have reviewed potential treatment options, including palliative medical therapy, conventional surgical aortic valve replacement, and transcatheter aortic valve replacement. We discussed treatment options in the context of the patient's specific comorbid medical conditions.   He would like to proceed with planning for TAVR. I will arrange a right and left heart catheter[facility] at [facility] 01/20/21 at 9am. Risks and benefits of the cath procedure and the valve procedure are reviewed with the patient. After the cath, he will have carotid artery dopplers, PT assessment and will then be referred to see Dr. Cyndia Bent in [place] surgery.    His TAVR will likely need to be from alternative access as his femoral arteries are heavily calcified and diseased and my prohibit sheath insertion/valve delivery.   BMET and CBC today. He will hold coumadin 5 days before his procedure.      Current medicines are reviewed at length with the patient today.  The patient does not have concerns regarding medicines.  The following changes have been made:  no change  Labs/ tests ordered today include:   Orders Placed This Encounter  Procedures   Basic metabolic panel   CBC      Disposition:   F/U with the valve team.   Signed, Lauree Chandler, MD 01/14/2021 9:26 AM    [address], [place], [place]  [address] Phone: [phone]; Fax: [phone]

## 2021-01-14 ENCOUNTER — Encounter: Payer: Self-pay | Admitting: Cardiovascular Disease

## 2021-01-14 ENCOUNTER — Ambulatory Visit (INDEPENDENT_AMBULATORY_CARE_PROVIDER_SITE_OTHER): Payer: Medicare Other | Admitting: Pulmonary Disease

## 2021-01-14 ENCOUNTER — Other Ambulatory Visit: Payer: Self-pay

## 2021-01-14 ENCOUNTER — Ambulatory Visit (INDEPENDENT_AMBULATORY_CARE_PROVIDER_SITE_OTHER): Payer: Medicare Other | Admitting: Cardiovascular Disease

## 2021-01-14 ENCOUNTER — Encounter: Payer: Self-pay | Admitting: Pulmonary Disease

## 2021-01-14 VITALS — BP 128/84 | HR 88 | Temp 97.9°F | Ht 68.5 in | Wt 208.0 lb

## 2021-01-14 VITALS — BP 126/60 | HR 68 | Ht 68.5 in | Wt 209.6 lb

## 2021-01-14 DIAGNOSIS — R0602 Shortness of breath: Secondary | ICD-10-CM | POA: Diagnosis not present

## 2021-01-14 DIAGNOSIS — I2510 Atherosclerotic heart disease of native coronary artery without angina pectoris: Secondary | ICD-10-CM | POA: Diagnosis not present

## 2021-01-14 DIAGNOSIS — I350 Nonrheumatic aortic (valve) stenosis: Secondary | ICD-10-CM

## 2021-01-14 DIAGNOSIS — G4733 Obstructive sleep apnea (adult) (pediatric): Secondary | ICD-10-CM

## 2021-01-14 LAB — CBC
Hematocrit: 39 % (ref 37.5–51.0)
Hemoglobin: 12.9 g/dL — ABNORMAL LOW (ref 13.0–17.7)
MCH: 30.1 pg (ref 26.6–33.0)
MCHC: 33.1 g/dL (ref 31.5–35.7)
MCV: 91 fL (ref 79–97)
Platelets: 208 10*3/uL (ref 150–450)
RBC: 4.29 x10E6/uL (ref 4.14–5.80)
RDW: 14.6 % (ref 11.6–15.4)
WBC: 13.8 10*3/uL — ABNORMAL HIGH (ref 3.4–10.8)

## 2021-01-14 LAB — BASIC METABOLIC PANEL
BUN/Creatinine Ratio: 19 (ref 10–24)
BUN: 22 mg/dL (ref 8–27)
CO2: 18 mmol/L — ABNORMAL LOW (ref 20–29)
Calcium: 9.6 mg/dL (ref 8.6–10.2)
Chloride: 107 mmol/L — ABNORMAL HIGH (ref 96–106)
Creatinine, Ser: 1.14 mg/dL (ref 0.76–1.27)
Glucose: 120 mg/dL — ABNORMAL HIGH (ref 65–99)
Potassium: 4.5 mmol/L (ref 3.5–5.2)
Sodium: 141 mmol/L (ref 134–144)
eGFR: 68 mL/min/{1.73_m2} (ref >59)

## 2021-01-14 MED ORDER — INCRUSE ELLIPTA 62.5 MCG/INH IN AEPB: 1.0000 | INHALATION_SPRAY | Freq: Every day | RESPIRATORY_TRACT | 6 refills | Status: DC

## 2021-01-14 NOTE — Progress Notes (Signed)
Patient seen in the office today and instructed on use of Incruse.  Patient expressed understanding and demonstrated technique.  Benetta Spar [facility] 01/14/21

## 2021-01-14 NOTE — Patient Instructions (Addendum)
Start Incruse inhaler 1 puff daily and monitor for any improvement in shortness of breath  We will check pulmonary function tests at follow up visit in 3 months  We will set you up for lung cancer screening in the future at the next visit.   We will schedule you for an overnight oximetry test

## 2021-01-14 NOTE — Progress Notes (Signed)
Synopsis: Referred in August 2022 for chest tightness  Subjective:   PATIENT ID: Rodney Friedman GENDER: male DOB: Sep 12, 1945, MRN: [id number]   HPI  Chief Complaint  Patient presents with   Consult    Self referral for chest tightness and SOB. Was in the hospital in late July 2022 for PNA and CHF. States he was doing well for about a week after the hospital stay, but the SOB has returned.     Rodney Friedman is a 75 year old male, recent former smoker with CLL, atrial fibrillation, diabetes mellitus type II, hypertension, coronary artery disease, aortic stenosis and sleep apnea who is referred to pulmonary clinic for chest tightness.   He was recently admitted to [facility] 7/29 to 8/1 for shortness of breath requiring bipap therapy. Initial concern was for pneumonia but antibiotics were discontinued and he was treated for heart failure exacerbation with improvement of his respiratory status.   Cardiology visit 8/22 with Dr. Dani Gobble Croitoru reviewed, he is being followed for aortic stenosis, atrial fibrillation, hypertension and coronary artery disease.  Given the progression of his aortic stenosis which is now deemed severe he is undergoing workup for TAVR.  He has history of CLL and is followed by Dr. Alvy Bimler  He quit smoking 30 days ago and has a 25 pack year smoking history. He has quit on his own and has been doing ok with mainly having cravings in the morning. He mainly has exertional dyspnea which can lead to chest tightness. He has infrequent cough and wheezing. He denies sputum production. He has history of sleep apnea but reports no nighttime awakenings of dyspnea, cough or wheezing. He denies daytime sleepiness.   Family History  Problem Relation Age of Onset   Colon cancer Mother    Ovarian cancer Mother    Uterine cancer Mother    Hypertension Father    Prostate cancer Father    Colon polyps Neg Hx      Social History   Socioeconomic History   Marital status: Divorced     Spouse name: Not on file   Number of children: 2   Years of education: 67   Highest education level: Not on file  Occupational History   Occupation: Retired Pharmacist, hospital  Tobacco Use   Smoking status: Former    Packs/day: 0.50    Years: 50.00    Pack years: 25.00    Types: Cigarettes    Quit date: 12/16/2020    Years since quitting: 0.0   Smokeless tobacco: Never  Vaping Use   Vaping Use: Never used  Substance and Sexual Activity   Alcohol use: No    Alcohol/week: 0.0 standard drinks   Drug use: No   Sexual activity: Not Currently  Other Topics Concern   Not on file  Social History Narrative   Lives at home w/ his father   Divorced   Right-handed   Education: Secretary/administrator   No caffeine   Social Determinants of Radio broadcast assistant Strain: Not on file  Food Insecurity: Not on file  Transportation Needs: Not on file  Physical Activity: Not on file  Stress: Not on file  Social Connections: Not on file  Intimate Partner Violence: Not on file     Allergies  Allergen Reactions   Other Rash    Allergen: "Plants and bushes while doing yard work"     Outpatient Medications Prior to Visit  Medication Sig Dispense Refill   Ascorbic Acid (VITAMIN C) 1000 MG tablet  Take 1,000 mg by mouth See admin instructions. Take 1 tablet  every other day alternating with '500mg'$  tablet     B Complex Vitamins (VITAMIN-B COMPLEX PO) Take 1 tablet by mouth at bedtime.     BIOTIN PO Take 1 tablet by mouth at bedtime.     buPROPion ER (WELLBUTRIN SR) 100 MG 12 hr tablet 2  qam   1  q diner 270 tablet 1   Calcium Carbonate-Vit D-Min (CALCIUM 1200 PO) Take 1,200 mg by mouth daily.     docusate sodium (COLACE) 100 MG capsule Take 100 mg by mouth.     ezetimibe (ZETIA) 10 MG tablet Take 1 tablet (10 mg total) by mouth daily. 90 tablet 3   furosemide (LASIX) 20 MG tablet Take 1 tablet (20 mg total) by mouth daily. 30 tablet 0   gabapentin (NEURONTIN) 100 MG capsule 3  qhs 270 capsule 1    ketoconazole (NIZORAL) 2 % shampoo Apply 1 application topically 2 (two) times a week. 120 mL 3   latanoprost (XALATAN) 0.005 % ophthalmic solution Place 1 drop into the left eye every other day.     LYCOPENE PO Take 10 mg by mouth every other day.     Magnesium 250 MG TABS Take 250 mg by mouth every 3 (three) days.     metFORMIN (GLUCOPHAGE) 500 MG tablet Take 1 tablet (500 mg total) by mouth 2 (two) times daily with a meal. 180 tablet 2   metoprolol (TOPROL-XL) 50 MG 24 hr tablet Take 1 tablet (50 mg total) by mouth 2 (two) times daily. Take with or immediately following a meal. 30 tablet 0   Omega-3 Fatty Acids (FISH OIL) 1000 MG CAPS Take 1,000 mg by mouth every other day.     rosuvastatin (CRESTOR) 20 MG tablet Take 1 tablet (20 mg total) by mouth daily. 90 tablet 3   Sodium Fluoride (PREVIDENT 5000 DRY MOUTH DT) Place 1 application onto teeth See admin instructions. Apply to gums every night for dry mouth     vitamin B-12 (CYANOCOBALAMIN) 500 MCG tablet Take 500 mcg by mouth every other day.     vitamin C (ASCORBIC ACID) 500 MG tablet Take 500 mg by mouth daily. Take 1 tablet every other day alternating with '1000mg'$      Vitamin D, Ergocalciferol, (DRISDOL) 1.25 MG (50000 UNIT) CAPS capsule Take 50,000 Units by mouth every Saturday.     warfarin (COUMADIN) 4 MG tablet TAKE 1/2 TO 1 TABLET DAILY AS DIRECTED BY COUMADIN CLINIC 90 tablet 1   Facility-Administered Medications Prior to Visit  Medication Dose Route Frequency Provider Last Rate Last Admin   0.9 %  sodium chloride infusion  500 mL Intravenous Once Nelida Meuse III, MD        Review of Systems  Constitutional:  Negative for chills, fever, malaise/fatigue and weight loss.  HENT:  Negative for congestion, sinus pain and sore throat.   Eyes: Negative.   Respiratory:  Positive for cough, shortness of breath and wheezing. Negative for hemoptysis and sputum production.   Cardiovascular:  Negative for chest pain, palpitations,  orthopnea, claudication and leg swelling.  Gastrointestinal:  Negative for abdominal pain, heartburn, nausea and vomiting.  Genitourinary: Negative.   Musculoskeletal:  Negative for joint pain and myalgias.  Skin:  Negative for rash.  Neurological:  Negative for weakness.  Endo/Heme/Allergies: Negative.   Psychiatric/Behavioral: Negative.     Objective:   Vitals:   01/14/21 1001  BP: 128/84  Pulse: 88  Temp: 97.9 F (36.6 C)  TempSrc: Oral  SpO2: 97%  Weight: 208 lb (94.3 kg)  Height: 5' 8.5" (1.74 m)     Physical Exam Constitutional:      General: He is not in acute distress.    Appearance: He is obese.  HENT:     Head: Normocephalic and atraumatic.  Eyes:     Extraocular Movements: Extraocular movements intact.     Conjunctiva/sclera: Conjunctivae normal.     Pupils: Pupils are equal, round, and reactive to light.  Cardiovascular:     Rate and Rhythm: Normal rate. Rhythm irregular.     Pulses: Normal pulses.     Heart sounds: Murmur heard.  Pulmonary:     Effort: Pulmonary effort is normal.     Breath sounds: No wheezing, rhonchi or rales.  Abdominal:     General: Bowel sounds are normal.     Palpations: Abdomen is soft.  Musculoskeletal:     Right lower leg: No edema.     Left lower leg: No edema.  Lymphadenopathy:     Cervical: No cervical adenopathy.  Skin:    General: Skin is warm and dry.  Neurological:     General: No focal deficit present.     Mental Status: He is alert.  Psychiatric:        Mood and Affect: Mood normal.        Behavior: Behavior normal.        Thought Content: Thought content normal.        Judgment: Judgment normal.    CBC    Component Value Date/Time   WBC 16.3 (H) 12/20/2020 0459   RBC 4.70 12/20/2020 0459   HGB 14.3 12/20/2020 0459   HGB 14.2 04/26/2020 0941   HGB 15.8 01/01/2017 0752   HCT 41.9 12/20/2020 0459   HCT 41.5 04/26/2020 0941   HCT 46.4 01/01/2017 0752   PLT 197 12/20/2020 0459   PLT 201 04/26/2020  0941   MCV 89.1 12/20/2020 0459   MCV 91 04/26/2020 0941   MCV 91.8 01/01/2017 0752   MCH 30.4 12/20/2020 0459   MCHC 34.1 12/20/2020 0459   RDW 13.9 12/20/2020 0459   RDW 13.4 04/26/2020 0941   RDW 13.5 01/01/2017 0752   LYMPHSABS 9.5 (H) 07/20/2020 0800   LYMPHSABS 9.5 (H) 04/30/2019 1103   LYMPHSABS 9.7 (H) 01/01/2017 0752   MONOABS 1.0 07/20/2020 0800   MONOABS 1.5 (H) 01/01/2017 0752   EOSABS 0.5 07/20/2020 0800   EOSABS 0.2 04/30/2019 1103   BASOSABS 0.1 07/20/2020 0800   BASOSABS 0.1 04/30/2019 1103   BASOSABS 0.1 01/01/2017 0752   BMP Latest Ref Rng & Units 12/20/2020 12/19/2020 12/18/2020  Glucose 70 - 99 mg/dL 98 174(H) 116(H)  BUN 8 - 23 mg/dL 24(H) 28(H) 23  Creatinine 0.61 - 1.24 mg/dL 0.91 1.17 0.93  BUN/Creat Ratio 10 - 24 - - -  Sodium 135 - 145 mmol/L 140 139 140  Potassium 3.5 - 5.1 mmol/L 3.6 3.6 4.3  Chloride 98 - 111 mmol/L 106 105 107  CO2 22 - 32 mmol/L '26 26 22  '$ Calcium 8.9 - 10.3 mg/dL 9.0 8.8(L) 9.3   Chest imaging: CTA Chest Aorta 12/30/20 No mediastinal or hilar adenopathy.  Esophagus is unremarkable.  Small bilateral pleural effusions lying dependently.  No pulmonary nodules or masses.  Saber-sheath trachea noted.  PFT: No flowsheet data found.  Echo 12/17/2020: LVEF 60 to 65%.  LV has normal function.  LV diastolic parameters are indeterminate.  RV systolic function is normal.  RV size is normal.  Normal pulmonary artery pressure.  Left atrium is severely dilated.  Mean aortic gradient has increased from 25 mmHg to 33 mmHg.  Aortic valve is normal in structure.  There is moderate calcification of the aortic valve.  Moderate thickening of the aortic valve.  Moderate aortic valve stenosis.  Assessment & Plan:   Shortness of breath - Plan: umeclidinium bromide (INCRUSE ELLIPTA) 62.5 MCG/INH AEPB, Pulmonary Function Test  Nonrheumatic aortic valve stenosis  Discussion: Rodney Friedman is a 75 year old male, recent former smoker with CLL, atrial  fibrillation, diabetes mellitus type II, hypertension, coronary artery disease, aortic stenosis and sleep apnea who is referred to pulmonary clinic for chest tightness.   His dyspnea and chest tightness seem more related to his issues with aortic stenosis but he is certainly at risk for obstructive lung disease given his smoking history. We will start him on LAMA therapy to optimize his lung function for his upcoming cardiac workup and potential TAVR.   We will check an overnight oximetry test for his history of sleep apnea. He did not tolerate CPAP therapy in the past but would be open to using a nasal canula if needed.  He does qualify for lung cancer screening which we will refer him in the future as he recently had CT chest imaging without concerning nodules/masses.  Follow up in 3 months with pulmonary function testing.  >60 minutes was spent performing chart review, direct patient encounter, and completing plan/documentation.   Freda Jackson, MD [facility] Office: [phone]   Current Outpatient Medications:    Ascorbic Acid (VITAMIN C) 1000 MG tablet, Take 1,000 mg by mouth See admin instructions. Take 1 tablet  every other day alternating with '500mg'$  tablet, Disp: , Rfl:    B Complex Vitamins (VITAMIN-B COMPLEX PO), Take 1 tablet by mouth at bedtime., Disp: , Rfl:    BIOTIN PO, Take 1 tablet by mouth at bedtime., Disp: , Rfl:    buPROPion ER (WELLBUTRIN SR) 100 MG 12 hr tablet, 2  qam   1  q diner, Disp: 270 tablet, Rfl: 1   Calcium Carbonate-Vit D-Min (CALCIUM 1200 PO), Take 1,200 mg by mouth daily., Disp: , Rfl:    docusate sodium (COLACE) 100 MG capsule, Take 100 mg by mouth., Disp: , Rfl:    ezetimibe (ZETIA) 10 MG tablet, Take 1 tablet (10 mg total) by mouth daily., Disp: 90 tablet, Rfl: 3   furosemide (LASIX) 20 MG tablet, Take 1 tablet (20 mg total) by mouth daily., Disp: 30 tablet, Rfl: 0   gabapentin (NEURONTIN) 100 MG capsule, 3  qhs, Disp: 270  capsule, Rfl: 1   ketoconazole (NIZORAL) 2 % shampoo, Apply 1 application topically 2 (two) times a week., Disp: 120 mL, Rfl: 3   latanoprost (XALATAN) 0.005 % ophthalmic solution, Place 1 drop into the left eye every other day., Disp: , Rfl:    LYCOPENE PO, Take 10 mg by mouth every other day., Disp: , Rfl:    Magnesium 250 MG TABS, Take 250 mg by mouth every 3 (three) days., Disp: , Rfl:    metFORMIN (GLUCOPHAGE) 500 MG tablet, Take 1 tablet (500 mg total) by mouth 2 (two) times daily with a meal., Disp: 180 tablet, Rfl: 2   metoprolol (TOPROL-XL) 50 MG 24 hr tablet, Take 1 tablet (50 mg total) by mouth 2 (two) times daily. Take with or immediately following a meal., Disp: 30 tablet,  Rfl: 0   Omega-3 Fatty Acids (FISH OIL) 1000 MG CAPS, Take 1,000 mg by mouth every other day., Disp: , Rfl:    rosuvastatin (CRESTOR) 20 MG tablet, Take 1 tablet (20 mg total) by mouth daily., Disp: 90 tablet, Rfl: 3   Sodium Fluoride (PREVIDENT 5000 DRY MOUTH DT), Place 1 application onto teeth See admin instructions. Apply to gums every night for dry mouth, Disp: , Rfl:    umeclidinium bromide (INCRUSE ELLIPTA) 62.5 MCG/INH AEPB, Inhale 1 puff into the lungs daily., Disp: 30 each, Rfl: 6   vitamin B-12 (CYANOCOBALAMIN) 500 MCG tablet, Take 500 mcg by mouth every other day., Disp: , Rfl:    vitamin C (ASCORBIC ACID) 500 MG tablet, Take 500 mg by mouth daily. Take 1 tablet every other day alternating with '1000mg'$ , Disp: , Rfl:    Vitamin D, Ergocalciferol, (DRISDOL) 1.25 MG (50000 UNIT) CAPS capsule, Take 50,000 Units by mouth every Saturday., Disp: , Rfl:    warfarin (COUMADIN) 4 MG tablet, TAKE 1/2 TO 1 TABLET DAILY AS DIRECTED BY COUMADIN CLINIC, Disp: 90 tablet, Rfl: 1  Current Facility-Administered Medications:    0.9 %  sodium chloride infusion, 500 mL, Intravenous, Once, Danis, Kirke Corin, MD

## 2021-01-14 NOTE — Patient Instructions (Addendum)
Medication Instructions:  Your physician recommends that you continue on your current medications as directed. Please refer to the Current Medication list given to you today.  *If you need a refill on your cardiac medications before your next appointment, please call your pharmacy*   Lab Work: TODAY:  BMET & CBC  If you have labs (blood work) drawn today and your tests are completely normal, you will receive your results only by: [place] (if you have MyChart) OR A paper copy in the mail If you have any lab test that is abnormal or we need to change your treatment, we will call you to review the results.   Testing/Procedures: Your physician has requested that you have a cardiac catheterization. Cardiac catheterization is used to diagnose and/or treat various heart conditions. Doctors may recommend this procedure for a number of different reasons. The most common reason is to evaluate chest pain. Chest pain can be a symptom of coronary artery disease (CAD), and cardiac catheterization can show whether plaque is narrowing or blocking your heart's arteries. This procedure is also used to evaluate the valves, as well as measure the blood flow and oxygen levels in different parts of your heart. For further information please visit [email or web address].[email or web address]. Please follow instruction sheet, BELOW:   [place] OFFICE [place], [place] [address] Dept: [phone] Loc: [place]  01/14/2021  You are scheduled for a Cardiac Catheterization on Thursday, September 1 with Dr. Lauree Chandler.  1. Please arrive at the [facility] (Main Entrance A) at [facility]: [address] [place], [place] [address] at 7:00 AM (This time is two hours before your procedure to ensure your preparation). Free valet parking service is available.   Special note: Every effort is made to  have your procedure done on time. Please understand that emergencies sometimes delay scheduled procedures.  2. Diet: Do not eat solid foods after midnight.  The patient may have clear liquids until 5am upon the day of the procedure.  3. Labs: You will need to have blood drawn on ALREADY DONE   4. Medication instructions in preparation for your procedure:   Contrast Allergy: No   Stop taking Coumadin (Warfarin) on Thursday, August 26.  Your last dose of Lasix will be Wednesday, January 19, 2021     Do not take Diabetes Med Glucophage (Metformin) on the day of the procedure and HOLD 48 HOURS AFTER THE PROCEDURE.  On the morning of your procedure, take your Aspirin 81 mg and any morning medicines NOT listed above.  You may use sips of water.  5. Plan for one night stay--bring personal belongings. 6. Bring a current list of your medications and current insurance cards. 7. You MUST have a responsible person to drive you home. 8. Someone MUST be with you the first 24 hours after you arrive home or your discharge will be delayed. 9. Please wear clothes that are easy to get on and off and wear slip-on shoes.  Thank you for allowing [place] to care for you!   -- [place] [facility]     Follow-Up: At [facility], you and your health needs are our priority.  As part of our continuing mission to provide you with exceptional heart care, we have created designated Provider Care Teams.  These Care Teams include your primary Cardiologist (physician) and Advanced Practice Providers (APPs -  Physician Assistants and Nurse  Practitioners) who all work together to provide you with the care you need, when you need it.  We recommend signing up for the patient portal called "MyChart".  Sign up information is provided on this After Visit Summary.  MyChart is used to connect with patients for Virtual Visits (Telemedicine).  Patients are able to view lab/test results, encounter notes,  upcoming appointments, etc.  Non-urgent messages can be sent to your provider as well.   To learn more about what you can do with MyChart, go to [email or web address].    Your next appointment:   2 week(s)  The format for your next appointment:   In Person  Provider:   Lauree Chandler, MD   Other Instructions

## 2021-01-15 ENCOUNTER — Encounter: Payer: Self-pay | Admitting: Pulmonary Disease

## 2021-01-15 DIAGNOSIS — Z23 Encounter for immunization: Secondary | ICD-10-CM | POA: Diagnosis not present

## 2021-01-17 MED ORDER — FUROSEMIDE 20 MG PO TABS: 20.0000 mg | ORAL_TABLET | Freq: Every day | ORAL | 3 refills | Status: DC

## 2021-01-19 ENCOUNTER — Telehealth: Payer: Self-pay | Admitting: *Deleted

## 2021-01-19 DIAGNOSIS — Z8546 Personal history of malignant neoplasm of prostate: Secondary | ICD-10-CM | POA: Diagnosis not present

## 2021-01-19 NOTE — Telephone Encounter (Signed)
Cardiac catheterization scheduled at [facility] for: Thursday January 20, 2021 [facility] Main Entrance A [facility]) at: 7 AM   No solid food after midnight prior to cath, clear liquids until 5 AM day of procedure.  Medication instructions: Hold: -Coumadin-none 01/15/21 until post procedure -Metformin-day of procedure and 48 hours post procedure -Lasix-AM of procedure  Except hold medications morning medications can be taken pre-cath with sips of water including aspirin 81 mg.    Confirmed patient has responsible adult to drive home post procedure and be with patient first 24 hours after arriving home *  Patients are allowed one visitor in the waiting room during the time they are at the hospital for their procedure. Both patient and visitor must wear a mask once they enter the hospital.   Patient reports does not currently have any symptoms concerning for COVID-19 and no household members with COVID-19 like illness.     *Patient reports that he does have transportation to and from the hospital, but does not have responsible adult to stay with him first 24 hours if he is same day discharge.               Patient reports his circumstances would be the same regardless of day/time of cath.              Patient states he discussed this at time of office visit 01/14/21 and was told, since no responsible adult to stay with him if same day discharge,  would need to stay overnight at hospital after cath 01/20/21.

## 2021-01-19 NOTE — Telephone Encounter (Deleted)
Cardiac catheterization scheduled at [facility] for: Thursday January 20, 2021 [facility] Main Entrance A [facility]) at: 7 AM   No solid food after midnight prior to cath, clear liquids until 5 AM day of procedure.  Medication instructions: Hold: -Coumadin-none 01/15/21 until post procedure -Metformin-day of procedure and 48 hours post procedure -Lasix-AM of procedure  Except hold medications morning medications can be taken pre-cath with sips of water including aspirin 81 mg.    Confirmed patient has responsible adult to drive home post procedure and be with patient first 24 hours after arriving home.  Patients are allowed one visitor in the waiting room during the time they are at the hospital for their procedure. Both patient and visitor must wear a mask once they enter the hospital.   Patient reports does not currently have any symptoms concerning for COVID-19 and no household members with COVID-19 like illness.

## 2021-01-20 ENCOUNTER — Other Ambulatory Visit: Payer: Self-pay

## 2021-01-20 ENCOUNTER — Encounter (HOSPITAL_COMMUNITY)
Admission: RE | Disposition: A | Discharge status: Home / Self Care | Payer: Self-pay | Source: Home / Self Care | Attending: Cardiovascular Disease

## 2021-01-20 ENCOUNTER — Observation Stay (HOSPITAL_COMMUNITY)
Admission: RE | Admit: 2021-01-20 | Discharge: 2021-01-21 | Disposition: A | Discharge status: Home / Self Care | Payer: Medicare Other | Attending: Cardiovascular Disease | Admitting: Cardiovascular Disease

## 2021-01-20 DIAGNOSIS — I25118 Atherosclerotic heart disease of native coronary artery with other forms of angina pectoris: Principal | ICD-10-CM | POA: Insufficient documentation

## 2021-01-20 DIAGNOSIS — Z856 Personal history of leukemia: Secondary | ICD-10-CM | POA: Insufficient documentation

## 2021-01-20 DIAGNOSIS — Z7901 Long term (current) use of anticoagulants: Secondary | ICD-10-CM | POA: Insufficient documentation

## 2021-01-20 DIAGNOSIS — I482 Chronic atrial fibrillation, unspecified: Secondary | ICD-10-CM | POA: Diagnosis not present

## 2021-01-20 DIAGNOSIS — Z8546 Personal history of malignant neoplasm of prostate: Secondary | ICD-10-CM | POA: Diagnosis not present

## 2021-01-20 DIAGNOSIS — Z7984 Long term (current) use of oral hypoglycemic drugs: Secondary | ICD-10-CM | POA: Insufficient documentation

## 2021-01-20 DIAGNOSIS — I2510 Atherosclerotic heart disease of native coronary artery without angina pectoris: Secondary | ICD-10-CM

## 2021-01-20 DIAGNOSIS — E119 Type 2 diabetes mellitus without complications: Secondary | ICD-10-CM | POA: Diagnosis not present

## 2021-01-20 DIAGNOSIS — Z951 Presence of aortocoronary bypass graft: Secondary | ICD-10-CM | POA: Insufficient documentation

## 2021-01-20 DIAGNOSIS — Z87891 Personal history of nicotine dependence: Secondary | ICD-10-CM | POA: Diagnosis not present

## 2021-01-20 DIAGNOSIS — I2582 Chronic total occlusion of coronary artery: Secondary | ICD-10-CM | POA: Diagnosis not present

## 2021-01-20 DIAGNOSIS — I350 Nonrheumatic aortic (valve) stenosis: Secondary | ICD-10-CM | POA: Diagnosis not present

## 2021-01-20 DIAGNOSIS — I10 Essential (primary) hypertension: Secondary | ICD-10-CM | POA: Diagnosis not present

## 2021-01-20 DIAGNOSIS — Z79899 Other long term (current) drug therapy: Secondary | ICD-10-CM | POA: Insufficient documentation

## 2021-01-20 HISTORY — PX: RIGHT/LEFT HEART CATH AND CORONARY/GRAFT ANGIOGRAPHY: CATH118267

## 2021-01-20 LAB — POCT I-STAT EG7
Acid-Base Excess: 0 mmol/L (ref 0.0–2.0)
Acid-base deficit: 1 mmol/L (ref 0.0–2.0)
Bicarbonate: 24.2 mmol/L (ref 20.0–28.0)
Bicarbonate: 24.3 mmol/L (ref 20.0–28.0)
Calcium, Ion: 1.23 mmol/L (ref 1.15–1.40)
Calcium, Ion: 1.23 mmol/L (ref 1.15–1.40)
HCT: 33 % — ABNORMAL LOW (ref 39.0–52.0)
HCT: 33 % — ABNORMAL LOW (ref 39.0–52.0)
Hemoglobin: 11.2 g/dL — ABNORMAL LOW (ref 13.0–17.0)
Hemoglobin: 11.2 g/dL — ABNORMAL LOW (ref 13.0–17.0)
O2 Saturation: 43 %
O2 Saturation: 48 %
Potassium: 4 mmol/L (ref 3.5–5.1)
Potassium: 4 mmol/L (ref 3.5–5.1)
Sodium: 145 mmol/L (ref 135–145)
Sodium: 145 mmol/L (ref 135–145)
TCO2: 25 mmol/L (ref 22–32)
TCO2: 25 mmol/L (ref 22–32)
pCO2, Ven: 38.2 mmHg — ABNORMAL LOW (ref 44.0–60.0)
pCO2, Ven: 39.9 mmHg — ABNORMAL LOW (ref 44.0–60.0)
pH, Ven: 7.392 (ref 7.250–7.430)
pH, Ven: 7.411 (ref 7.250–7.430)
pO2, Ven: 24 mmHg — CL (ref 32.0–45.0)
pO2, Ven: 26 mmHg — CL (ref 32.0–45.0)

## 2021-01-20 LAB — GLUCOSE, CAPILLARY
Glucose-Capillary: 141 mg/dL — ABNORMAL HIGH (ref 70–99)
Glucose-Capillary: 128 mg/dL — ABNORMAL HIGH (ref 70–99)

## 2021-01-20 LAB — POCT I-STAT 7, (LYTES, BLD GAS, ICA,H+H)
Acid-base deficit: 3 mmol/L — ABNORMAL HIGH (ref 0.0–2.0)
Bicarbonate: 21.9 mmol/L (ref 20.0–28.0)
Calcium, Ion: 1.17 mmol/L (ref 1.15–1.40)
HCT: 31 % — ABNORMAL LOW (ref 39.0–52.0)
Hemoglobin: 10.5 g/dL — ABNORMAL LOW (ref 13.0–17.0)
O2 Saturation: 85 %
Potassium: 3.8 mmol/L (ref 3.5–5.1)
Sodium: 146 mmol/L — ABNORMAL HIGH (ref 135–145)
TCO2: 23 mmol/L (ref 22–32)
pCO2 arterial: 38.1 mmHg (ref 32.0–48.0)
pH, Arterial: 7.368 (ref 7.350–7.450)
pO2, Arterial: 52 mmHg — ABNORMAL LOW (ref 83.0–108.0)

## 2021-01-20 LAB — PROTIME-INR
INR: 1.3 — ABNORMAL HIGH (ref 0.8–1.2)
Prothrombin Time: 15.9 seconds — ABNORMAL HIGH (ref 11.4–15.2)

## 2021-01-20 SURGERY — RIGHT/LEFT HEART CATH AND CORONARY/GRAFT ANGIOGRAPHY
Anesthesia: LOCAL

## 2021-01-20 MED ORDER — HEPARIN (PORCINE) IN NACL 1000-0.9 UT/500ML-% IV SOLN
INTRAVENOUS | Status: DC | PRN
  Administered 2021-01-20 (×2): 500 mL

## 2021-01-20 MED ORDER — FENTANYL CITRATE (PF) 100 MCG/2ML IJ SOLN
INTRAMUSCULAR | Status: AC
  Filled 2021-01-20: qty 2

## 2021-01-20 MED ORDER — MIDAZOLAM HCL 2 MG/2ML IJ SOLN
INTRAMUSCULAR | Status: AC
  Filled 2021-01-20: qty 2

## 2021-01-20 MED ORDER — SODIUM CHLORIDE 0.9 % IV SOLN: 250.0000 mL | INTRAVENOUS | Status: DC | PRN

## 2021-01-20 MED ORDER — IOHEXOL 350 MG/ML SOLN
INTRAVENOUS | Status: DC | PRN
  Administered 2021-01-20: 90 mL via INTRA_ARTERIAL

## 2021-01-20 MED ORDER — WARFARIN - PHARMACIST DOSING INPATIENT: Freq: Every day | Status: DC

## 2021-01-20 MED ORDER — HEPARIN (PORCINE) IN NACL 1000-0.9 UT/500ML-% IV SOLN
INTRAVENOUS | Status: AC
  Filled 2021-01-20: qty 1000

## 2021-01-20 MED ORDER — SODIUM CHLORIDE 0.9 % WEIGHT BASED INFUSION
3.0000 mL/kg/h | INTRAVENOUS | Status: AC
  Administered 2021-01-20: 3 mL/kg/h via INTRAVENOUS

## 2021-01-20 MED ORDER — SODIUM CHLORIDE 0.9% FLUSH: 3.0000 mL | Freq: Two times a day (BID) | INTRAVENOUS | Status: DC

## 2021-01-20 MED ORDER — LIDOCAINE HCL (PF) 1 % IJ SOLN
INTRAMUSCULAR | Status: AC
  Filled 2021-01-20: qty 30

## 2021-01-20 MED ORDER — FUROSEMIDE 10 MG/ML IJ SOLN
40.0000 mg | Freq: Once | INTRAMUSCULAR | Status: AC
  Administered 2021-01-20: 40 mg via INTRAVENOUS
  Filled 2021-01-20: qty 4

## 2021-01-20 MED ORDER — HEPARIN SODIUM (PORCINE) 1000 UNIT/ML IJ SOLN
INTRAMUSCULAR | Status: DC | PRN
  Administered 2021-01-20: 4500 [IU] via INTRAVENOUS

## 2021-01-20 MED ORDER — HYDRALAZINE HCL 20 MG/ML IJ SOLN: 10.0000 mg | INTRAMUSCULAR | Status: AC | PRN

## 2021-01-20 MED ORDER — GABAPENTIN 100 MG PO CAPS
200.0000 mg | ORAL_CAPSULE | Freq: Every day | ORAL | Status: DC
  Administered 2021-01-20: 200 mg via ORAL
  Filled 2021-01-20: qty 2

## 2021-01-20 MED ORDER — SODIUM CHLORIDE 0.9% FLUSH: 3.0000 mL | INTRAVENOUS | Status: DC | PRN

## 2021-01-20 MED ORDER — VERAPAMIL HCL 2.5 MG/ML IV SOLN
INTRAVENOUS | Status: DC | PRN
  Administered 2021-01-20: 10 mL via INTRA_ARTERIAL

## 2021-01-20 MED ORDER — GABAPENTIN 100 MG PO CAPS
100.0000 mg | ORAL_CAPSULE | Freq: Every day | ORAL | Status: DC
  Administered 2021-01-21: 100 mg via ORAL
  Filled 2021-01-20: qty 1

## 2021-01-20 MED ORDER — ACETAMINOPHEN 325 MG PO TABS: 650.0000 mg | ORAL_TABLET | ORAL | Status: DC | PRN

## 2021-01-20 MED ORDER — SODIUM CHLORIDE 0.9 % WEIGHT BASED INFUSION: 1.0000 mL/kg/h | INTRAVENOUS | Status: DC

## 2021-01-20 MED ORDER — LABETALOL HCL 5 MG/ML IV SOLN: 10.0000 mg | INTRAVENOUS | Status: AC | PRN

## 2021-01-20 MED ORDER — MIDAZOLAM HCL 2 MG/2ML IJ SOLN
INTRAMUSCULAR | Status: DC | PRN
  Administered 2021-01-20: 1 mg via INTRAVENOUS

## 2021-01-20 MED ORDER — FENTANYL CITRATE (PF) 100 MCG/2ML IJ SOLN
INTRAMUSCULAR | Status: DC | PRN
  Administered 2021-01-20: 25 ug via INTRAVENOUS

## 2021-01-20 MED ORDER — ROSUVASTATIN CALCIUM 20 MG PO TABS
20.0000 mg | ORAL_TABLET | Freq: Every day | ORAL | Status: DC
  Administered 2021-01-20: 20 mg via ORAL
  Filled 2021-01-20: qty 1

## 2021-01-20 MED ORDER — WARFARIN SODIUM 5 MG PO TABS
6.0000 mg | ORAL_TABLET | Freq: Once | ORAL | Status: AC
  Administered 2021-01-20: 6 mg via ORAL
  Filled 2021-01-20: qty 1

## 2021-01-20 MED ORDER — VERAPAMIL HCL 2.5 MG/ML IV SOLN
INTRAVENOUS | Status: AC
  Filled 2021-01-20: qty 2

## 2021-01-20 MED ORDER — HEPARIN SODIUM (PORCINE) 1000 UNIT/ML IJ SOLN
INTRAMUSCULAR | Status: AC
  Filled 2021-01-20: qty 1

## 2021-01-20 MED ORDER — SODIUM CHLORIDE 0.9 % IV SOLN: INTRAVENOUS | Status: AC

## 2021-01-20 MED ORDER — ASPIRIN 81 MG PO CHEW: 81.0000 mg | CHEWABLE_TABLET | ORAL | Status: DC

## 2021-01-20 MED ORDER — LIDOCAINE HCL (PF) 1 % IJ SOLN
INTRAMUSCULAR | Status: DC | PRN
  Administered 2021-01-20 (×2): 2 mL

## 2021-01-20 MED ORDER — EZETIMIBE 10 MG PO TABS
10.0000 mg | ORAL_TABLET | Freq: Every day | ORAL | Status: DC
  Administered 2021-01-20: 10 mg via ORAL
  Filled 2021-01-20: qty 1

## 2021-01-20 MED ORDER — ONDANSETRON HCL 4 MG/2ML IJ SOLN: 4.0000 mg | Freq: Four times a day (QID) | INTRAMUSCULAR | Status: DC | PRN

## 2021-01-20 MED ORDER — GABAPENTIN 100 MG PO CAPS: 100.0000 mg | ORAL_CAPSULE | ORAL | Status: DC

## 2021-01-20 MED ORDER — METOPROLOL SUCCINATE ER 50 MG PO TB24
50.0000 mg | ORAL_TABLET | Freq: Two times a day (BID) | ORAL | Status: DC
  Administered 2021-01-20 – 2021-01-21 (×2): 50 mg via ORAL
  Filled 2021-01-20 (×2): qty 1

## 2021-01-20 SURGICAL SUPPLY — 14 items
CATH BALLN WEDGE 5F 110CM (CATHETERS) ×2 IMPLANT
CATH INFINITI 5 FR LCB (CATHETERS) ×2 IMPLANT
CATH INFINITI 5FR AL1 (CATHETERS) ×2 IMPLANT
CATH INFINITI 5FR MULTPACK ANG (CATHETERS) ×2 IMPLANT
DEVICE RAD COMP TR BAND LRG (VASCULAR PRODUCTS) ×2 IMPLANT
GLIDESHEATH SLEND SS 6F .021 (SHEATH) ×2 IMPLANT
GUIDEWIRE INQWIRE 1.5J.035X260 (WIRE) ×1 IMPLANT
INQWIRE 1.5J .035X260CM (WIRE) ×2
KIT HEART LEFT (KITS) ×2 IMPLANT
PACK CARDIAC CATHETERIZATION (CUSTOM PROCEDURE TRAY) ×2 IMPLANT
SHEATH GLIDE SLENDER 4/5FR (SHEATH) ×2 IMPLANT
TRANSDUCER W/STOPCOCK (MISCELLANEOUS) ×2 IMPLANT
TUBING CIL FLEX 10 FLL-RA (TUBING) ×2 IMPLANT
WIRE EMERALD ST .035X150CM (WIRE) ×2 IMPLANT

## 2021-01-20 NOTE — Discharge Instructions (Addendum)
Resume coumadin tonight.  Hold metformin 48 hours post cath  Dear Rodney Friedman,   Your physician has requested Outpatient Physical Therapy evaluation. This will be done at [facility] at [address] [place] [address]. This is scheduled on Tuesday, February 01, 2021 at 3:00 PM, please arrive at 2:45 PM for check-in. You have been referred to Dr Gilford Raid at [facility] for further TAVR evaluation. [name], [place], [place], [place] [address], [phone] ([place]) You are scheduled for surgical evaluation with Dr Cyndia Bent on Tuesday, February 01, 2021 at 4:00 PM Please call the office on the day of your appointment to make sure the surgeon will be there and on time as many times there are delays or rescheduling due to emergency surgery. If you have any questions or concerns, please do not hesitate to call.   Sincerely, Theodosia Quay, RN [phone]

## 2021-01-20 NOTE — Interval H&P Note (Signed)
History and Physical Interval Note:  01/20/2021 8:48 AM  Rodney Friedman  has presented today for surgery, with the diagnosis of TAVR workup.  The various methods of treatment have been discussed with the patient and family. After consideration of risks, benefits and other options for treatment, the patient has consented to  Procedure(s): RIGHT/LEFT HEART CATH AND CORONARY/GRAFT ANGIOGRAPHY (N/A) as a surgical intervention.  The patient's history has been reviewed, patient examined, no change in status, stable for surgery.  I have reviewed the patient's chart and labs.  Questions were answered to the patient's satisfaction.    Cath Lab Visit (complete for each Cath Lab visit)  Clinical Evaluation Leading to the Procedure:   ACS: No.  Non-ACS:    Anginal Classification: CCS II  Anti-ischemic medical therapy: Minimal Therapy (1 class of medications)  Non-Invasive Test Results: No non-invasive testing performed  Prior CABG: Previous CABG        Lauree Chandler

## 2021-01-20 NOTE — Progress Notes (Signed)
Pt transported to 4E on tele with NT and RN. VSS. Report given to [facility], Therapist, sports.

## 2021-01-20 NOTE — Progress Notes (Addendum)
Pt c/o sob with movement and chest pressure that resolves with rest. I spoke with Ria Comment NP/ card, HR goes to 130- 40, AFIB hx, spo2 82% w movement, 90%with rest, O2 2L 91%. NP will assess. 1215 spoke with Dr Julianne Handler about sat at 90% w 2L o2, pt is dyspneic with movement, c/o chest pressure with movement, pt is belly breathing with minor exertion, took 45 minute for O2 recovery after ambulating to bathroom// 68fet. Orders followed, pt to be admitted for observation.

## 2021-01-20 NOTE — Progress Notes (Signed)
ANTICOAGULATION CONSULT NOTE - Initial Consult  Pharmacy Consult for warfarin Indication: atrial fibrillation  Allergies  Allergen Reactions   Other Rash    Allergen: "Plants and bushes while doing yard work"    Patient Measurements: Height: 5' 8.5" (174 cm) Weight: 93 kg (205 lb) IBW/kg (Calculated) : 69.55 Heparin Dosing Weight: 88.8 kg   Vital Signs: Temp: 97.8 F (36.6 C) (09/01 0659) Temp Source: Oral (09/01 0659) BP: 155/100 (09/01 1435) Pulse Rate: 54 (09/01 1435)  Labs: Recent Labs    01/20/21 0659 01/20/21 0902 01/20/21 0902 01/20/21 0907 01/20/21 0908  HGB  --  11.2*   < > 10.5* 11.2*  HCT  --  33.0*  --  31.0* 33.0*  LABPROT 15.9*  --   --   --   --   INR 1.3*  --   --   --   --    < > = values in this interval not displayed.    Estimated Creatinine Clearance: 64.5 mL/min (by C-G formula based on SCr of 1.14 mg/dL).   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   Depression    Diabetes mellitus without complication (H[facility])    Diverticulosis    Eye abnormality    Macular scarring R eye   Glaucoma    Heart murmur    HLD (hyperlipidemia)    Hypertension    Leukemia ([place])    CLL   Myocardial infarction ([place]) 2006   Prostate cancer ([place]) 07/08/2013   [facility] at [facility]- getting Lupron shot every 6 months - traces in prostate and 2 lymphnodes left hip- non focused traces per pt    Sleep apnea    Substance abuse (HCC)    Tremor, essential 09/22/2015    Medications:  Scheduled:   ezetimibe  10 mg Oral QHS   [START ON 01/21/2021] gabapentin  100 mg Oral Daily   And   gabapentin  200 mg Oral QHS   metoprolol succinate  50 mg Oral BID   rosuvastatin  20 mg Oral QHS   sodium chloride flush  3 mL Intravenous Q12H     Assessment: 25 yom who underwent cardiac cath today finding 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 On warfarin PTA for hx Afib - LD 01/13/21.  PTA warfarin regimen is 4 mg daily except '2mg'$  on Tues/Fri.   INR is subtherapeutic at 1.3. Hgb 11.2. No s/sx of bleeding. Okay to restart warfarin tonight.   Goal of Therapy:  INR 2-3 Monitor platelets by anticoagulation protocol: Yes   Plan:  Will order higher dose of warfarin 6 mg tonight  Monitor daily INR, CBC, and for s/sx of bleeding   Antonietta Jewel, PharmD, BCCCP Clinical Pharmacist  Phone: [phone] 01/20/2021 4:18 PM  Please check AMION for all [place] phone numbers After 10:00 PM, call [place] [phone]

## 2021-01-20 NOTE — Progress Notes (Signed)
Pt received to room, alert oriented and appropriate.  Splint to left wrist, with dry dressing at radial site, level 0.  Pt ambulated from stretcher to bed unassisted.  Monitors placed, vitals taken and Pt oriented to room and equipment.  Will cont plan of care.

## 2021-01-21 ENCOUNTER — Telehealth: Payer: Self-pay | Admitting: Cardiovascular Disease

## 2021-01-21 ENCOUNTER — Encounter (HOSPITAL_COMMUNITY): Payer: Self-pay | Admitting: Cardiovascular Disease

## 2021-01-21 DIAGNOSIS — I5031 Acute diastolic (congestive) heart failure: Secondary | ICD-10-CM | POA: Diagnosis not present

## 2021-01-21 DIAGNOSIS — I25118 Atherosclerotic heart disease of native coronary artery with other forms of angina pectoris: Secondary | ICD-10-CM | POA: Diagnosis not present

## 2021-01-21 DIAGNOSIS — E119 Type 2 diabetes mellitus without complications: Secondary | ICD-10-CM | POA: Diagnosis not present

## 2021-01-21 DIAGNOSIS — I2582 Chronic total occlusion of coronary artery: Secondary | ICD-10-CM | POA: Diagnosis not present

## 2021-01-21 DIAGNOSIS — I10 Essential (primary) hypertension: Secondary | ICD-10-CM | POA: Diagnosis not present

## 2021-01-21 DIAGNOSIS — Z951 Presence of aortocoronary bypass graft: Secondary | ICD-10-CM | POA: Diagnosis not present

## 2021-01-21 DIAGNOSIS — Z87891 Personal history of nicotine dependence: Secondary | ICD-10-CM | POA: Diagnosis not present

## 2021-01-21 LAB — PROTIME-INR
INR: 1.3 — ABNORMAL HIGH (ref 0.8–1.2)
Prothrombin Time: 16.1 seconds — ABNORMAL HIGH (ref 11.4–15.2)

## 2021-01-21 MED ORDER — WARFARIN SODIUM 5 MG PO TABS: 6.0000 mg | ORAL_TABLET | Freq: Once | ORAL | Status: DC

## 2021-01-21 NOTE — Progress Notes (Signed)
Pt concerned that he has not been discharged yet. States he was told he was to get lasix yesterday stay overnight and go home this morning.  States he has limited transportation.  Called cardiology PA and made aware. Pt aware that we have [facility] transport available to help as he lives in [place]. Pt resting with call bell within reach.  Will continue to monitor.

## 2021-01-21 NOTE — Discharge Summary (Addendum)
Discharge Summary    Patient ID: Rodney Friedman MRN: [id number]; DOB: 09/27/1945  Admit date: 01/20/2021 Discharge date: 01/21/2021  PCP:  Kristie Cowman, MD   [facility] Providers Cardiologist:  Sanda Klein, MD   {    Discharge Diagnoses    Active Problems:   Coronary artery disease of native artery of native heart with stable angina pectoris [facility])   Chronic atrial fibrillation [facility])   Aortic stenosis    Diagnostic Studies/Procedures    Right and left heart cath on 01/20/21:    Prox Cx lesion is 99% stenosed.   Prox Cx to Mid Cx lesion is 100% stenosed.   Prox LAD lesion is 70% stenosed.   Mid LAD lesion is 100% stenosed.   Prox RCA lesion is 100% stenosed.   SVG graft was visualized by angiography and is large.   LIMA graft was visualized by angiography.   SVG graft was visualized by angiography and is large.   The graft exhibits no disease.   The graft exhibits no disease.   The graft exhibits no disease.   Severe triple vessel CAD s/p 3V CABG with 3/3 patent bypass grafts 100% mid LAD occlusion. The mid and distal LAD fills from the patent LIMA graft. The Diagonal branch fills from the patent vein graft.  100% mid Circumflex occlusion. The vein graft to the OM branch is patent The RCA has chronic proximal total occlusion. The distal vessel fills from left to right collaterals through the vein graft to OM.  Severe aortic stenosis by echo (by cath the mean gradient is 32 mmhg, peak to peak gradient is 38 mmHg, AVA 1.0 cm2).    Recommendations: Will continue medical management of CAD. Continue workup for TAVR.   Diagnostic Dominance: Right  _____________   History of Present Illness     Per admission H&P:  Rodney Friedman is a 75 y.o. male with PMH of CAD s/p 3V CABG in 2006, paroxysmal atrial fibrillation, carotid artery disease, HTN, hyperlipidemia, DM type 2, former ETOH abuse (stopped drinking etoh in 2002), former tobacco abuse (stopped July 2022),  CLL, sleep apnea and severe aortic stenosis who was seen by Dr Angelena Form on 01/14/21 for consultation,  referred by Dr. Sallyanne Kuster, for further discussion regarding his aortic stenosis and possible TAVR. His aortic stenosis has been moderate and followed by Dr. Sallyanne Kuster. He was hospitalized with pneumonia in July 2022 and since then has had worsening dyspnea on exertion. Echo 12/17/20 with LVEF=60-65%. Trivial mitral regurgitation. The aortic valve is thickened and calcified with limited leaflet excursion. Mean gradient 33.5 mmHg, peak gradient 57.5 mmHg, AVA 0.59 cm2. Dimensionless index 0.16. SVI 27. This is consistent with low flow, low gradient severe aortic stenosis. Cardiac CT with valve calcium score of 3000 consistent with severe aortic stenosis. Patent grafts (LIMA to LAD, SVG to Diagonal and SVG to OM) by CTA. Sizing and anatomy suitable for 26 mm Edwards Sapien 3 valve. CTA suggests marginal femoral access for TAVR given heavy calcification of his iliac and femoral arteries.    He had complained progressive dyspnea on exertion and chest pressure. He denied LE edema or syncope. He had some exertional dizziness. He has his own teeth and has no active dental issues. He sees a Pharmacist, community regularly. He lives in [place] alone. He is a retired high school history Pharmacist, hospital.   He was felt may be benefit from AVR, arranged for outpatient R/L heart cath on 01/20/21. Post cardiac cath he was kept overnight for diuresis.  Hospital Course     Consultants: N/A  Severe Aortic Valve Stenosis Decompensated HFpEF - diagnostic R and L cardiac cath as above, plan is continue TAVR workup, will need outpatient carotid artery dopplers, PT assessment and consult with Dr. Cyndia Bent in CT surgery, this will be arranged by TAVR clinic  - s/p IV Lasix 40 mg x 1 9/1 post cath, euvolemic today - arranged TAVR clinic follow up on 02/01/21   Severe CAD - see cath as above, discussed post cath care - continue home medical therapy  with metoprolol, Crestor, Zetia, no change of home medicine this admission  Paroxysmal A fib  - discussed with pharmacy today, PTA warfarin regimen is 4 mg daily except '2mg'$  on Tues/Fri. warfarin was held before cath, INR 1.3 today, before holding INR has been therapeutic 2-3, he was given warfarin '6mg'$  last night, pharmacy recommend to discharge on previous home dosing today, next INR check is on 01/28/21    Other chronic medical problems is not addressed during this admission   Did the patient have an acute coronary syndrome (MI, NSTEMI, STEMI, etc) this admission?:  No                               Did the patient have a percutaneous coronary intervention (stent / angioplasty)?:  No.       _____________  Discharge Vitals Blood pressure 122/69, pulse 93, temperature (!) 97.5 F (36.4 C), temperature source Oral, resp. rate 20, height 5' 8.5" (1.74 m), weight 93 kg, SpO2 92 %.  Filed Weights   01/20/21 0659  Weight: 93 kg    Physical exam: See below   Labs & Radiologic Studies    CBC Recent Labs    01/20/21 0907 01/20/21 0908  HGB 10.5* 11.2*  HCT 31.0* [id number]*   Basic Metabolic Panel Recent Labs    01/20/21 0907 01/20/21 0908  NA 146* 145  K 3.8 4.0   Liver Function Tests No results for input(s): AST, ALT, ALKPHOS, BILITOT, PROT, ALBUMIN in the last 72 hours. No results for input(s): LIPASE, AMYLASE in the last 72 hours. High Sensitivity Troponin:   No results for input(s): TROPONINIHS in the last 720 hours.  BNP Invalid input(s): POCBNP D-Dimer No results for input(s): DDIMER in the last 72 hours. Hemoglobin A1C No results for input(s): HGBA1C in the last 72 hours. Fasting Lipid Panel No results for input(s): CHOL, HDL, LDLCALC, TRIG, CHOLHDL, LDLDIRECT in the last 72 hours. Thyroid Function Tests No results for input(s): TSH, T4TOTAL, T3FREE, THYROIDAB in the last 72 hours.  Invalid input(s): FREET3 _____________  CARDIAC CATHETERIZATION  Result Date:  01/20/2021   Prox Cx lesion is 99% stenosed.   Prox Cx to Mid Cx lesion is 100% stenosed.   Prox LAD lesion is 70% stenosed.   Mid LAD lesion is 100% stenosed.   Prox RCA lesion is 100% stenosed.   SVG graft was visualized by angiography and is large.   LIMA graft was visualized by angiography.   SVG graft was visualized by angiography and is large.   The graft exhibits no disease.   The graft exhibits no disease.   The graft exhibits no disease. Severe triple vessel CAD s/p 3V CABG with 3/3 patent bypass grafts 100% mid LAD occlusion. The mid and distal LAD fills from the patent LIMA graft. The Diagonal branch fills from the patent vein graft. 100% mid Circumflex occlusion. The vein graft to  the OM branch is patent The RCA has chronic proximal total occlusion. The distal vessel fills from left to right collaterals through the vein graft to OM. Severe aortic stenosis by echo (by cath the mean gradient is 32 mmhg, peak to peak gradient is 38 mmHg, AVA 1.0 cm2). Recommendations: Will continue medical management of CAD. Continue workup for TAVR.   CT CORONARY MORPH W/CTA COR W/SCORE W/CA W/CM &/OR WO/CM  Addendum Date: 12/30/2020   ADDENDUM REPORT: 12/30/2020 12:52 CLINICAL DATA:  Aortic stenosis EXAM: Cardiac TAVR CT TECHNIQUE: The patient was scanned on a Siemens Force [id number] slice scanner. A 120 kV retrospective scan was triggered in the descending thoracic aorta at 111 HU's. Gantry rotation speed was 270 msecs and collimation was .9 mm. No beta blockade or nitro were given. The 3D data set was reconstructed in 5% intervals of the R-R cycle. Systolic and diastolic phases were analyzed on a dedicated work station using MPR, MIP and VRT modes. The patient received 80 cc of contrast. FINDINGS: Aortic Valve: Tri leaflet calcified with restricted leaflet motion calcium score 3420 Aorta: No aneurysm, severe calcific atherosclerosis normal arch vessels Sinotubular Junction: 29 mm Ascending Thoracic Aorta: 35 mm Aortic  Arch: 31 mm Descending Thoracic Aorta: 27 mm Sinus of Valsalva Measurements: Non-coronary: 33.4 mm Right - coronary: 30.1 mm Left - coronary: 33.4 mm Coronary Artery Height above Annulus: Left Main: 14.3 mm above annulus Right Coronary: 18.9 mm above annulus Virtual Basal Annulus Measurements: Maximum/Minimum Diameter: 22.2 mm x 26.8 mm Perimeter: 78 mm Area: 451 mm 2 Coronary Arteries: Likely left dominant Patent SVG to distal OM, Patent SVG to Diagonal Patent LIMA to LAD Optimum Fluoroscopic Angle for Delivery: LAO 18 Caudal 6 degrees IMPRESSION: 1.  Tri leaflet AV calcified with score 3420 2. Optimum angiographic angle for deployment LAO 18 Caudal 6 degrees 3. Left dominant native coronary arteries Patent SVG to distal OM and Diagonal Patent LIMA to LAD 4. Annular area of 451 mm2 suitable for a 26 mm Sapien 3 valve large bulky calcium noted at commissure between left and non cusps 5. Coronary arteries sufficient height above annulus for deployment with patent grafts 6. Normal aortic root 3.5 cm with severe calcific atherosclerosis and normal arch vessels 7.  Severe posterior annular mitral calcification Jenkins Rouge Electronically Signed   By: Jenkins Rouge M.D.   On: 12/30/2020 12:52   Result Date: 12/30/2020 EXAM: OVER-READ INTERPRETATION  CT CHEST The following report is an over-read performed by radiologist Dr. Vinnie Langton of [facility], [place] on 12/30/2020. This over-read does not include interpretation of cardiac or coronary anatomy or pathology. The coronary calcium score/coronary CTA interpretation by the cardiologist is attached. COMPARISON:  Chest CT 12/17/2020. FINDINGS: Extracardiac findings will be described separately under dictation for contemporaneously obtained CTA chest, abdomen and pelvis. IMPRESSION: Please see separate dictation for contemporaneously obtained CTA chest, abdomen and pelvis dated 12/30/2020 for full description of relevant extracardiac findings. Electronically  Signed: By: Vinnie Langton M.D. On: 12/30/2020 12:01   CT ANGIO CHEST AORTA W/CM & OR WO/CM  Result Date: 12/30/2020 CLINICAL DATA:  75 year old male with history of severe aortic stenosis. Preprocedural study prior to potential transcatheter aortic valve replacement (TAVR) procedure. EXAM: CT ANGIOGRAPHY CHEST, ABDOMEN AND PELVIS TECHNIQUE: Multidetector CT imaging through the chest, abdomen and pelvis was performed using the standard protocol during bolus administration of intravenous contrast. Multiplanar reconstructed images and MIPs were obtained and reviewed to evaluate the vascular anatomy. CONTRAST:  60m OMNIPAQUE IOHEXOL 350  MG/ML SOLN COMPARISON:  Chest CTA 12/17/2020. No prior CT the abdomen or pelvis. FINDINGS: CTA CHEST FINDINGS Cardiovascular: Heart size is enlarged with left atrial dilatation. There is no significant pericardial fluid, thickening or pericardial calcification. There is aortic atherosclerosis, as well as atherosclerosis of the great vessels of the mediastinum and the coronary arteries, including calcified atherosclerotic plaque in the left main, left anterior descending, left circumflex and right coronary arteries. Status post median sternotomy for CABG including LIMA to the LAD. Severe thickening and calcification of the aortic valve. Severe calcifications of the mitral annulus. Mediastinum/Lymph Nodes: No pathologically enlarged mediastinal or hilar lymph nodes. Esophagus is unremarkable in appearance. No axillary lymphadenopathy. Lungs/Pleura: Small bilateral pleural effusions lying dependently. No acute consolidative airspace disease. No suspicious appearing pulmonary nodules or masses are noted. Musculoskeletal/Soft Tissues: Median sternotomy wires. There are no aggressive appearing lytic or blastic lesions noted in the visualized portions of the skeleton. CTA ABDOMEN AND PELVIS FINDINGS Hepatobiliary: No suspicious cystic or solid hepatic lesions. No intra or extrahepatic  biliary ductal dilatation. Gallbladder is normal in appearance. Pancreas: No pancreatic mass. No pancreatic ductal dilatation. No pancreatic or peripancreatic fluid collections or inflammatory changes. Spleen: Unremarkable. Adrenals/Urinary Tract: Several low-attenuation lesions in both kidneys compatible with simple cysts measuring up to 1.6 cm in diameter in the lower pole of the right kidney and 1.8 cm in diameter in the lower pole the left kidney. Other subcentimeter low-attenuation lesions are also noted in both kidneys, too small to characterize, but statistically likely to represent tiny cysts. No hydroureteronephrosis. Urinary bladder is normal in appearance. Bilateral adrenal glands are normal in appearance. Stomach/Bowel: The appearance of the stomach is normal. No pathologic dilatation of small bowel or colon. Numerous colonic diverticulae are noted, without surrounding inflammatory changes to suggest an acute diverticulitis at this time. Normal appendix. Vascular/Lymphatic: Aortic atherosclerosis with vascular findings and measurements pertinent to potential TAVR procedure, as detailed below. 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 No lymphadenopathy noted in the abdomen or pelvis. Reproductive: Prostate gland and seminal vesicles are unremarkable in appearance. Other: No significant volume of ascites.  No pneumoperitoneum. Musculoskeletal: There are no aggressive appearing lytic or blastic lesions noted in the visualized portions of the skeleton. VASCULAR MEASUREMENTS PERTINENT TO TAVR: AORTA: Minimal Aortic Diameter-16 x 11 mm Severity of Aortic Calcification-severe RIGHT PELVIS: Right Common Iliac Artery - Minimal Diameter-7.7 x 6.0 mm Tortuosity-mild Calcification-severe Right External Iliac Artery - Minimal Diameter-6.1 x 4.3 mm Tortuosity - mild Calcification - severe Right Common Femoral Artery - Minimal Diameter-5.8 x 3.6 mm  Tortuosity - mild Calcification - severe LEFT PELVIS: Left Common Iliac Artery - Minimal Diameter-7.4 x 6.7 mm Tortuosity - mild Calcification - severe Left External Iliac Artery - Minimal Diameter-5.3 x 4.3 mm Tortuosity - mild Calcification - severe Left Common Femoral Artery - Minimal Diameter-5.7 x 4.5 mm Tortuosity - mild Calcification - severe Review of the MIP images confirms the above findings. IMPRESSION: 1. Vascular findings and measurements pertinent to potential TAVR procedure, as detailed above. 2. Severe thickening calcification of the aortic valve, compatible with reported clinical history of severe aortic stenosis. 3. Aortic atherosclerosis, in addition to left main and 3 vessel coronary artery disease. Status post median sternotomy for CABG including LIMA to the LAD. 4. Small bilateral pleural effusions lying dependently. 5. Colonic diverticulosis without evidence of acute diverticulitis at this time. 6. Additional incidental findings, as above. Electronically Signed   By:  Vinnie Langton M.D.   On: 12/30/2020 15:05   CT ANGIO ABDOMEN PELVIS  W &/OR WO CONTRAST  Result Date: 12/30/2020 CLINICAL DATA:  75 year old male with history of severe aortic stenosis. Preprocedural study prior to potential transcatheter aortic valve replacement (TAVR) procedure. EXAM: CT ANGIOGRAPHY CHEST, ABDOMEN AND PELVIS TECHNIQUE: Multidetector CT imaging through the chest, abdomen and pelvis was performed using the standard protocol during bolus administration of intravenous contrast. Multiplanar reconstructed images and MIPs were obtained and reviewed to evaluate the vascular anatomy. CONTRAST:  13m OMNIPAQUE IOHEXOL 350 MG/ML SOLN COMPARISON:  Chest CTA 12/17/2020. No prior CT the abdomen or pelvis. FINDINGS: CTA CHEST FINDINGS Cardiovascular: Heart size is enlarged with left atrial dilatation. There is no significant pericardial fluid, thickening or pericardial calcification. There is aortic atherosclerosis, as  well as atherosclerosis of the great vessels of the mediastinum and the coronary arteries, including calcified atherosclerotic plaque in the left main, left anterior descending, left circumflex and right coronary arteries. Status post median sternotomy for CABG including LIMA to the LAD. Severe thickening and calcification of the aortic valve. Severe calcifications of the mitral annulus. Mediastinum/Lymph Nodes: No pathologically enlarged mediastinal or hilar lymph nodes. Esophagus is unremarkable in appearance. No axillary lymphadenopathy. Lungs/Pleura: Small bilateral pleural effusions lying dependently. No acute consolidative airspace disease. No suspicious appearing pulmonary nodules or masses are noted. Musculoskeletal/Soft Tissues: Median sternotomy wires. There are no aggressive appearing lytic or blastic lesions noted in the visualized portions of the skeleton. CTA ABDOMEN AND PELVIS FINDINGS Hepatobiliary: No suspicious cystic or solid hepatic lesions. No intra or extrahepatic biliary ductal dilatation. Gallbladder is normal in appearance. Pancreas: No pancreatic mass. No pancreatic ductal dilatation. No pancreatic or peripancreatic fluid collections or inflammatory changes. Spleen: Unremarkable. Adrenals/Urinary Tract: Several low-attenuation lesions in both kidneys compatible with simple cysts measuring up to 1.6 cm in diameter in the lower pole of the right kidney and 1.8 cm in diameter in the lower pole the left kidney. Other subcentimeter low-attenuation lesions are also noted in both kidneys, too small to characterize, but statistically likely to represent tiny cysts. No hydroureteronephrosis. Urinary bladder is normal in appearance. Bilateral adrenal glands are normal in appearance. Stomach/Bowel: The appearance of the stomach is normal. No pathologic dilatation of small bowel or colon. Numerous colonic diverticulae are noted, without surrounding inflammatory changes to suggest an acute  diverticulitis at this time. Normal appendix. Vascular/Lymphatic: Aortic atherosclerosis with vascular findings and measurements pertinent to potential TAVR procedure, as detailed below. 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 No lymphadenopathy noted in the abdomen or pelvis. Reproductive: Prostate gland and seminal vesicles are unremarkable in appearance. Other: No significant volume of ascites.  No pneumoperitoneum. Musculoskeletal: There are no aggressive appearing lytic or blastic lesions noted in the visualized portions of the skeleton. VASCULAR MEASUREMENTS PERTINENT TO TAVR: AORTA: Minimal Aortic Diameter-16 x 11 mm Severity of Aortic Calcification-severe RIGHT PELVIS: Right Common Iliac Artery - Minimal Diameter-7.7 x 6.0 mm Tortuosity-mild Calcification-severe Right External Iliac Artery - Minimal Diameter-6.1 x 4.3 mm Tortuosity - mild Calcification - severe Right Common Femoral Artery - Minimal Diameter-5.8 x 3.6 mm Tortuosity - mild Calcification - severe LEFT PELVIS: Left Common Iliac Artery - Minimal Diameter-7.4 x 6.7 mm Tortuosity - mild Calcification - severe Left External Iliac Artery - Minimal Diameter-5.3 x 4.3 mm Tortuosity - mild Calcification - severe Left Common Femoral Artery - Minimal Diameter-5.7 x 4.5 mm Tortuosity - mild Calcification -  severe Review of the MIP images confirms the above findings. IMPRESSION: 1. Vascular findings and measurements pertinent to potential TAVR procedure, as detailed above. 2. Severe thickening calcification of the aortic valve, compatible with reported clinical history of severe aortic stenosis. 3. Aortic atherosclerosis, in addition to left main and 3 vessel coronary artery disease. Status post median sternotomy for CABG including LIMA to the LAD. 4. Small bilateral pleural effusions lying dependently. 5. Colonic diverticulosis without evidence of acute diverticulitis at this time. 6.  Additional incidental findings, as above. Electronically Signed   By: Vinnie Langton M.D.   On: 12/30/2020 15:05   Disposition   Patient is seen by Dr Percival Spanish today, see physical exam for detail, deemed stable for home discharge today  in good condition.  Follow-up Plans & Appointments     Follow-up Information     Gaye Pollack, MD Follow up on 02/01/2021.   Specialty: Cardiothoracic Surgery Why: at 4pm for TAVR evalaution Contact information: [address] [place] [address] [phone]                Discharge Instructions     Diet - low sodium heart healthy   Complete by: As directed    Discharge instructions   Complete by: As directed    Radial Site Care  Refer to this sheet in the next few weeks. These instructions provide you with information on caring for yourself after your procedure. Your caregiver may also give you more specific instructions. Your treatment has been planned according to current medical practices, but problems sometimes occur. Call your caregiver if you have any problems or questions after your procedure.  HOME CARE INSTRUCTIONS You may shower the day after the procedure. Remove the bandage (dressing) and gently wash the site with plain soap and water. Gently pat the site dry.  Do not apply powder or lotion to the site.  Do not submerge the affected site in water for 3 to 5 days.  Inspect the site at least twice daily.  Do not flex or bend the affected arm for 24 hours.  No lifting over 5 pounds (2.3 kg) for 5 days after your procedure.  Do not drive home if you are discharged the same day of the procedure. Have someone else drive you.  You may drive 24 hours after the procedure unless otherwise instructed by your caregiver.   What to expect: Any bruising will usually fade within 1 to 2 weeks.  Blood that collects in the tissue (hematoma) may be painful to the touch. It should usually decrease in size and tenderness  within 1 to 2 weeks.   SEEK IMMEDIATE MEDICAL CARE IF: You have unusual pain at the radial site.  You have redness, warmth, swelling, or pain at the radial site.  You have drainage (other than a small amount of blood on the dressing).  You have chills.  You have a fever or persistent symptoms for more than 72 hours.  You have a fever and your symptoms suddenly get worse.  Your arm becomes pale, cool, tingly, or numb.  You have heavy bleeding from the site. Hold pressure on the site.   Increase activity slowly   Complete by: As directed        Discharge Medications   Allergies as of 01/21/2021       Reactions   Other Rash   Allergen: "Plants and bushes while doing yard work"        Medication  List     TAKE these medications    BIOTIN PO Take 1 tablet by mouth at bedtime.   buPROPion ER 100 MG 12 hr tablet Commonly known as: Wellbutrin SR 2  qam   1  q diner   CALCIUM 1200 PO Take 1,200 mg by mouth at bedtime.   docusate sodium 100 MG capsule Commonly known as: COLACE Take 100 mg by mouth at bedtime as needed for moderate constipation.   ezetimibe 10 MG tablet Commonly known as: ZETIA Take 1 tablet (10 mg total) by mouth daily. What changed: when to take this   Fish Oil 1000 MG Caps Take 1,000 mg by mouth daily with supper.   furosemide 20 MG tablet Commonly known as: Lasix Take 1 tablet (20 mg total) by mouth daily.   gabapentin 100 MG capsule Commonly known as: NEURONTIN 3  qhs What changed:  how much to take how to take this when to take this additional instructions   Incruse Ellipta 62.5 MCG/INH Aepb Generic drug: umeclidinium bromide Inhale 1 puff into the lungs daily.   ketoconazole 2 % shampoo Commonly known as: NIZORAL Apply 1 application topically 2 (two) times a week. What changed: when to take this   latanoprost 0.005 % ophthalmic solution Commonly known as: XALATAN Place 1 drop into the left eye every other day.   LYCOPENE  PO Take 10 mg by mouth every other day.   Magnesium 250 MG Tabs Take 250 mg by mouth every 3 (three) days.   metFORMIN 500 MG tablet Commonly known as: GLUCOPHAGE Take 1 tablet (500 mg total) by mouth 2 (two) times daily with a meal. Notes to patient: HOLD RESTART Sunday MORNING   metoprolol succinate 50 MG 24 hr tablet Commonly known as: TOPROL-XL Take 1 tablet (50 mg total) by mouth 2 (two) times daily. Take with or immediately following a meal.   PREVIDENT 5000 DRY MOUTH DT Place 1 application onto teeth See admin instructions. Apply to gums every night for dry mouth   rosuvastatin 20 MG tablet Commonly known as: Crestor Take 1 tablet (20 mg total) by mouth daily. What changed: when to take this   Tums Ultra 1000 400 MG chewable tablet Generic drug: calcium elemental as carbonate Chew 1,000 mg by mouth daily as needed for heartburn.   vitamin B-12 500 MCG tablet Commonly known as: CYANOCOBALAMIN Take 500 mcg by mouth every other day.   vitamin C 500 MG tablet Commonly known as: ASCORBIC ACID Take 500-1,000 mg by mouth See admin instructions. Alternate taking 500 mg one day and 1000 mg the next   Vitamin D (Ergocalciferol) 1.25 MG (50000 UNIT) Caps capsule Commonly known as: DRISDOL Take 50,000 Units by mouth every Saturday.   VITAMIN-B COMPLEX PO Take 1 tablet by mouth daily with supper.   warfarin 4 MG tablet Commonly known as: COUMADIN Take as directed. If you are unsure how to take this medication, talk to your nurse or doctor. Original instructions: TAKE 1/2 TO 1 TABLET DAILY AS DIRECTED BY COUMADIN CLINIC What changed: See the new instructions. Notes to patient: RESTART TONIGHT           Outstanding Labs/Studies     Duration of Discharge Encounter   Greater than 30 minutes including physician time.  Signed, Margie Billet, NP 01/21/2021, 12:36 PM  History and all data above reviewed.  Patient examined.  I agree with the findings as above.   Breathing  is improved.  No pain.  Ready to  go home.  The patient exam reveals COR:RRR , systolic murmur ,  Lungs: Clear  ,  Abd: Positive bowel sounds, no rebound no guarding, Ext left radial access site without bleeding or bruising  .  All available labs, radiology testing, previous records reviewed. Agree with documented assessment and plan. OK to go home and follow up as above.   We clarified with pharmacy the warfarin instructions as above.  Jeneen Rinks Eliyanna Ault  12:48 PM  01/21/2021

## 2021-01-21 NOTE — Progress Notes (Signed)
ANTICOAGULATION CONSULT NOTE -follow up  Pharmacy Consult for warfarin Indication: atrial fibrillation  Allergies  Allergen Reactions   Other Rash    Allergen: "Plants and bushes while doing yard work"    Patient Measurements: Height: 5' 8.5" (174 cm) Weight: 93 kg (205 lb) IBW/kg (Calculated) : 69.55 Heparin Dosing Weight: 88.8 kg   Vital Signs: Temp: 97.5 F (36.4 C) (09/02 1221) Temp Source: Oral (09/02 1221) BP: 122/69 (09/02 1221) Pulse Rate: 93 (09/02 1221)  Labs: Recent Labs    01/20/21 0659 01/20/21 0902 01/20/21 0902 01/20/21 0907 01/20/21 0908 01/21/21 1039  HGB  --  11.2*   < > 10.5* 11.2*  --   HCT  --  33.0*  --  31.0* 33.0*  --   LABPROT 15.9*  --   --   --   --  16.1*  INR 1.3*  --   --   --   --  1.3*   < > = values in this interval not displayed.     Estimated Creatinine Clearance: 64.5 mL/min (by C-G formula based on SCr of 1.14 mg/dL).   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   Depression    Diabetes mellitus without complication (HCC)    Diverticulosis    Eye abnormality    Macular scarring R eye   Glaucoma    Heart murmur    HLD (hyperlipidemia)    Hypertension    Leukemia ([place])    CLL   Myocardial infarction ([place]) 2006   Prostate cancer ([place]) 07/08/2013   [facility] at [facility]- getting Lupron shot every 6 months - traces in prostate and 2 lymphnodes left hip- non focused traces per pt    Sleep apnea    Substance abuse ([place])    Tremor, essential 09/22/2015    Medications:  Scheduled:   ezetimibe  10 mg Oral QHS   gabapentin  100 mg Oral Daily   And   gabapentin  200 mg Oral QHS   metoprolol succinate  50 mg Oral BID   rosuvastatin  20 mg Oral QHS   sodium chloride flush  3 mL Intravenous Q12H    Warfarin - Pharmacist Dosing Inpatient   Does not apply q1600    Assessment: 12 yom who underwent cardiac cath today finding 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 On warfarin PTA for hx Afib - LD 01/13/21.  PTA warfarin regimen is 4 mg daily except '2mg'$  on Tues/Fri.   Resumed warfarin 9/1 post cardiac cath. INR today remains 1.3, subtherapeutic.  No s/sx of bleeding.   Goal of Therapy:  INR 2-3 Monitor platelets by anticoagulation protocol: Yes   Plan:  Give Warfarin 6 mg tonight  Monitor daily INR, CBC, and for s/sx of bleeding   Antonietta Jewel, PharmD, BCCCP Clinical Pharmacist  Phone: [phone] 01/21/2021 1:57 PM  Please check AMION for all [place] phone numbers After 10:00 PM, call [place] [phone]

## 2021-01-21 NOTE — Telephone Encounter (Signed)
Patient states he is waiting to be discharged from the hospital, but they won't discharge him without Dr. Camillia Herter approval. He would like someone to contact the hospital so he can be discharged. Patient called from his room phone in [place] room 7 so he did not have a call back number.

## 2021-01-24 ENCOUNTER — Observation Stay (HOSPITAL_COMMUNITY)
Admission: EM | Admit: 2021-01-24 | Discharge: 2021-01-25 | Disposition: A | Discharge status: Home / Self Care | Payer: Medicare Other | Attending: Internal Medicine | Admitting: Internal Medicine

## 2021-01-24 ENCOUNTER — Other Ambulatory Visit: Payer: Self-pay

## 2021-01-24 ENCOUNTER — Encounter (HOSPITAL_COMMUNITY): Payer: Self-pay

## 2021-01-24 ENCOUNTER — Emergency Department (HOSPITAL_COMMUNITY): Payer: Medicare Other

## 2021-01-24 DIAGNOSIS — I110 Hypertensive heart disease with heart failure: Secondary | ICD-10-CM | POA: Insufficient documentation

## 2021-01-24 DIAGNOSIS — Z8546 Personal history of malignant neoplasm of prostate: Secondary | ICD-10-CM | POA: Insufficient documentation

## 2021-01-24 DIAGNOSIS — I25118 Atherosclerotic heart disease of native coronary artery with other forms of angina pectoris: Secondary | ICD-10-CM

## 2021-01-24 DIAGNOSIS — Z7901 Long term (current) use of anticoagulants: Secondary | ICD-10-CM | POA: Diagnosis not present

## 2021-01-24 DIAGNOSIS — I482 Chronic atrial fibrillation, unspecified: Secondary | ICD-10-CM

## 2021-01-24 DIAGNOSIS — C61 Malignant neoplasm of prostate: Secondary | ICD-10-CM

## 2021-01-24 DIAGNOSIS — C9110 Chronic lymphocytic leukemia of B-cell type not having achieved remission: Secondary | ICD-10-CM

## 2021-01-24 DIAGNOSIS — I480 Paroxysmal atrial fibrillation: Secondary | ICD-10-CM | POA: Insufficient documentation

## 2021-01-24 DIAGNOSIS — I2510 Atherosclerotic heart disease of native coronary artery without angina pectoris: Secondary | ICD-10-CM | POA: Insufficient documentation

## 2021-01-24 DIAGNOSIS — Z20822 Contact with and (suspected) exposure to covid-19: Secondary | ICD-10-CM | POA: Insufficient documentation

## 2021-01-24 DIAGNOSIS — R0689 Other abnormalities of breathing: Secondary | ICD-10-CM | POA: Diagnosis not present

## 2021-01-24 DIAGNOSIS — Z79899 Other long term (current) drug therapy: Secondary | ICD-10-CM | POA: Insufficient documentation

## 2021-01-24 DIAGNOSIS — I517 Cardiomegaly: Secondary | ICD-10-CM | POA: Diagnosis not present

## 2021-01-24 DIAGNOSIS — J90 Pleural effusion, not elsewhere classified: Secondary | ICD-10-CM

## 2021-01-24 DIAGNOSIS — I509 Heart failure, unspecified: Secondary | ICD-10-CM | POA: Diagnosis not present

## 2021-01-24 DIAGNOSIS — I491 Atrial premature depolarization: Secondary | ICD-10-CM | POA: Diagnosis not present

## 2021-01-24 DIAGNOSIS — J810 Acute pulmonary edema: Secondary | ICD-10-CM | POA: Diagnosis not present

## 2021-01-24 DIAGNOSIS — G250 Essential tremor: Secondary | ICD-10-CM

## 2021-01-24 DIAGNOSIS — E669 Obesity, unspecified: Secondary | ICD-10-CM

## 2021-01-24 DIAGNOSIS — Z7984 Long term (current) use of oral hypoglycemic drugs: Secondary | ICD-10-CM | POA: Diagnosis not present

## 2021-01-24 DIAGNOSIS — R000 Tachycardia, unspecified: Secondary | ICD-10-CM | POA: Diagnosis not present

## 2021-01-24 DIAGNOSIS — Z951 Presence of aortocoronary bypass graft: Secondary | ICD-10-CM | POA: Diagnosis not present

## 2021-01-24 DIAGNOSIS — Z87891 Personal history of nicotine dependence: Secondary | ICD-10-CM | POA: Insufficient documentation

## 2021-01-24 DIAGNOSIS — E1169 Type 2 diabetes mellitus with other specified complication: Secondary | ICD-10-CM

## 2021-01-24 DIAGNOSIS — I5033 Acute on chronic diastolic (congestive) heart failure: Principal | ICD-10-CM | POA: Insufficient documentation

## 2021-01-24 DIAGNOSIS — C775 Secondary and unspecified malignant neoplasm of intrapelvic lymph nodes: Secondary | ICD-10-CM

## 2021-01-24 DIAGNOSIS — E119 Type 2 diabetes mellitus without complications: Secondary | ICD-10-CM | POA: Insufficient documentation

## 2021-01-24 DIAGNOSIS — I350 Nonrheumatic aortic (valve) stenosis: Secondary | ICD-10-CM

## 2021-01-24 DIAGNOSIS — I4891 Unspecified atrial fibrillation: Secondary | ICD-10-CM | POA: Diagnosis not present

## 2021-01-24 DIAGNOSIS — R0602 Shortness of breath: Secondary | ICD-10-CM | POA: Diagnosis not present

## 2021-01-24 DIAGNOSIS — I959 Hypotension, unspecified: Secondary | ICD-10-CM | POA: Diagnosis not present

## 2021-01-24 LAB — RESP PANEL BY RT-PCR (FLU A&B, COVID) ARPGX2
Influenza A by PCR: NEGATIVE
Influenza B by PCR: NEGATIVE
SARS Coronavirus 2 by RT PCR: NEGATIVE

## 2021-01-24 LAB — CBC WITH DIFFERENTIAL/PLATELET
Abs Immature Granulocytes: 0 10*3/uL (ref 0.00–0.07)
Basophils Absolute: 0.4 10*3/uL — ABNORMAL HIGH (ref 0.0–0.1)
Basophils Relative: 3 %
Eosinophils Absolute: 0 10*3/uL (ref 0.0–0.5)
Eosinophils Relative: 0 %
HCT: 37.9 % — ABNORMAL LOW (ref 39.0–52.0)
Hemoglobin: 12.6 g/dL — ABNORMAL LOW (ref 13.0–17.0)
Lymphocytes Relative: 30 %
Lymphs Abs: 3.9 10*3/uL (ref 0.7–4.0)
MCH: 30.3 pg (ref 26.0–34.0)
MCHC: 33.2 g/dL (ref 30.0–36.0)
MCV: 91.1 fL (ref 80.0–100.0)
Monocytes Absolute: 0.8 10*3/uL (ref 0.1–1.0)
Monocytes Relative: 6 %
Neutro Abs: 7.9 10*3/uL — ABNORMAL HIGH (ref 1.7–7.7)
Neutrophils Relative %: 61 %
Platelets: 191 10*3/uL (ref 150–400)
RBC: 4.16 MIL/uL — ABNORMAL LOW (ref 4.22–5.81)
RDW: 14.7 % (ref 11.5–15.5)
WBC: 12.9 10*3/uL — ABNORMAL HIGH (ref 4.0–10.5)
nRBC: 0 % (ref 0.0–0.2)
nRBC: 0 /100 WBC

## 2021-01-24 LAB — PROTIME-INR
INR: 1.8 — ABNORMAL HIGH (ref 0.8–1.2)
Prothrombin Time: 21 seconds — ABNORMAL HIGH (ref 11.4–15.2)

## 2021-01-24 LAB — COMPREHENSIVE METABOLIC PANEL
ALT: 31 U/L (ref 0–44)
AST: 30 U/L (ref 15–41)
Albumin: 3.4 g/dL — ABNORMAL LOW (ref 3.5–5.0)
Alkaline Phosphatase: 32 U/L — ABNORMAL LOW (ref 38–126)
Anion gap: 9 (ref 5–15)
BUN: 21 mg/dL (ref 8–23)
CO2: 21 mmol/L — ABNORMAL LOW (ref 22–32)
Calcium: 9.5 mg/dL (ref 8.9–10.3)
Chloride: 111 mmol/L (ref 98–111)
Creatinine, Ser: 1.28 mg/dL — ABNORMAL HIGH (ref 0.61–1.24)
GFR, Estimated: 59 mL/min — ABNORMAL LOW (ref >60)
Glucose, Bld: 143 mg/dL — ABNORMAL HIGH (ref 70–99)
Potassium: 4.1 mmol/L (ref 3.5–5.1)
Sodium: 141 mmol/L (ref 135–145)
Total Bilirubin: 0.8 mg/dL (ref 0.3–1.2)
Total Protein: 5.9 g/dL — ABNORMAL LOW (ref 6.5–8.1)

## 2021-01-24 LAB — CBG MONITORING, ED
Glucose-Capillary: 126 mg/dL — ABNORMAL HIGH (ref 70–99)
Glucose-Capillary: 125 mg/dL — ABNORMAL HIGH (ref 70–99)

## 2021-01-24 LAB — TROPONIN I (HIGH SENSITIVITY)
Troponin I (High Sensitivity): 30 ng/L — ABNORMAL HIGH (ref <18)
Troponin I (High Sensitivity): 30 ng/L — ABNORMAL HIGH (ref <18)

## 2021-01-24 LAB — I-STAT CHEM 8, ED
BUN: 19 mg/dL (ref 8–23)
Calcium, Ion: 1.22 mmol/L (ref 1.15–1.40)
Chloride: 110 mmol/L (ref 98–111)
Creatinine, Ser: 1.3 mg/dL — ABNORMAL HIGH (ref 0.61–1.24)
Glucose, Bld: 135 mg/dL — ABNORMAL HIGH (ref 70–99)
HCT: 34 % — ABNORMAL LOW (ref 39.0–52.0)
Hemoglobin: 11.6 g/dL — ABNORMAL LOW (ref 13.0–17.0)
Potassium: 4 mmol/L (ref 3.5–5.1)
Sodium: 145 mmol/L (ref 135–145)
TCO2: 23 mmol/L (ref 22–32)

## 2021-01-24 LAB — GLUCOSE, CAPILLARY
Glucose-Capillary: 103 mg/dL — ABNORMAL HIGH (ref 70–99)
Glucose-Capillary: 113 mg/dL — ABNORMAL HIGH (ref 70–99)

## 2021-01-24 LAB — BRAIN NATRIURETIC PEPTIDE: B Natriuretic Peptide: 672.5 pg/mL — ABNORMAL HIGH (ref 0.0–100.0)

## 2021-01-24 MED ORDER — FUROSEMIDE 10 MG/ML IJ SOLN
20.0000 mg | Freq: Two times a day (BID) | INTRAMUSCULAR | Status: DC
  Administered 2021-01-24 – 2021-01-25 (×2): 20 mg via INTRAVENOUS
  Filled 2021-01-24 (×2): qty 2

## 2021-01-24 MED ORDER — FUROSEMIDE 10 MG/ML IJ SOLN
40.0000 mg | Freq: Once | INTRAMUSCULAR | Status: AC
  Administered 2021-01-24: 40 mg via INTRAVENOUS
  Filled 2021-01-24: qty 4

## 2021-01-24 MED ORDER — BUPROPION HCL ER (SR) 100 MG PO TB12: 100.0000 mg | ORAL_TABLET | ORAL | Status: DC

## 2021-01-24 MED ORDER — INSULIN ASPART 100 UNIT/ML IJ SOLN
0.0000 [IU] | Freq: Three times a day (TID) | INTRAMUSCULAR | Status: DC
  Administered 2021-01-24 – 2021-01-25 (×3): 1 [IU] via SUBCUTANEOUS

## 2021-01-24 MED ORDER — DOCUSATE SODIUM 100 MG PO CAPS
100.0000 mg | ORAL_CAPSULE | Freq: Every evening | ORAL | Status: DC
  Administered 2021-01-24: 100 mg via ORAL
  Filled 2021-01-24 (×2): qty 1

## 2021-01-24 MED ORDER — METOPROLOL SUCCINATE ER 50 MG PO TB24
50.0000 mg | ORAL_TABLET | Freq: Two times a day (BID) | ORAL | Status: DC
  Administered 2021-01-24 – 2021-01-25 (×3): 50 mg via ORAL
  Filled 2021-01-24 (×2): qty 1
  Filled 2021-01-24: qty 2

## 2021-01-24 MED ORDER — ROSUVASTATIN CALCIUM 20 MG PO TABS
20.0000 mg | ORAL_TABLET | Freq: Every day | ORAL | Status: DC
  Administered 2021-01-24: 20 mg via ORAL
  Filled 2021-01-24: qty 1

## 2021-01-24 MED ORDER — CYANOCOBALAMIN 500 MCG PO TABS
500.0000 ug | ORAL_TABLET | ORAL | Status: DC
  Administered 2021-01-24: 500 ug via ORAL
  Filled 2021-01-24: qty 1

## 2021-01-24 MED ORDER — ACETAMINOPHEN 325 MG PO TABS: 650.0000 mg | ORAL_TABLET | Freq: Four times a day (QID) | ORAL | Status: DC | PRN

## 2021-01-24 MED ORDER — WARFARIN SODIUM 4 MG PO TABS
4.0000 mg | ORAL_TABLET | Freq: Once | ORAL | Status: AC
  Administered 2021-01-24: 4 mg via ORAL
  Filled 2021-01-24 (×2): qty 1

## 2021-01-24 MED ORDER — OMEGA-3-ACID ETHYL ESTERS 1 G PO CAPS
1.0000 g | ORAL_CAPSULE | Freq: Every day | ORAL | Status: DC
  Administered 2021-01-24 – 2021-01-25 (×2): 1 g via ORAL
  Filled 2021-01-24 (×2): qty 1

## 2021-01-24 MED ORDER — GABAPENTIN 100 MG PO CAPS
100.0000 mg | ORAL_CAPSULE | Freq: Every day | ORAL | Status: DC
  Administered 2021-01-24 – 2021-01-25 (×2): 100 mg via ORAL
  Filled 2021-01-24 (×2): qty 1

## 2021-01-24 MED ORDER — GABAPENTIN 100 MG PO CAPS
200.0000 mg | ORAL_CAPSULE | Freq: Every day | ORAL | Status: DC
  Administered 2021-01-24: 200 mg via ORAL
  Filled 2021-01-24: qty 2

## 2021-01-24 MED ORDER — BUPROPION HCL ER (SR) 100 MG PO TB12
100.0000 mg | ORAL_TABLET | Freq: Every day | ORAL | Status: DC
  Administered 2021-01-24: 100 mg via ORAL
  Filled 2021-01-24 (×3): qty 1

## 2021-01-24 MED ORDER — ACETAMINOPHEN 650 MG RE SUPP: 650.0000 mg | Freq: Four times a day (QID) | RECTAL | Status: DC | PRN

## 2021-01-24 MED ORDER — WARFARIN - PHARMACIST DOSING INPATIENT: Freq: Every day | Status: DC

## 2021-01-24 MED ORDER — UMECLIDINIUM BROMIDE 62.5 MCG/INH IN AEPB
1.0000 | INHALATION_SPRAY | Freq: Every day | RESPIRATORY_TRACT | Status: DC
  Administered 2021-01-24 – 2021-01-25 (×2): 1 via RESPIRATORY_TRACT
  Filled 2021-01-24: qty 7

## 2021-01-24 MED ORDER — GABAPENTIN 100 MG PO CAPS: 100.0000 mg | ORAL_CAPSULE | ORAL | Status: DC

## 2021-01-24 MED ORDER — BUPROPION HCL ER (SR) 100 MG PO TB12
200.0000 mg | ORAL_TABLET | ORAL | Status: DC
  Administered 2021-01-24 – 2021-01-25 (×2): 200 mg via ORAL
  Filled 2021-01-24 (×2): qty 2

## 2021-01-24 MED ORDER — EZETIMIBE 10 MG PO TABS
10.0000 mg | ORAL_TABLET | Freq: Every day | ORAL | Status: DC
  Administered 2021-01-24: 10 mg via ORAL
  Filled 2021-01-24: qty 1

## 2021-01-24 MED ORDER — LATANOPROST 0.005 % OP SOLN
1.0000 [drp] | OPHTHALMIC | Status: DC
  Filled 2021-01-24: qty 2.5

## 2021-01-24 NOTE — Progress Notes (Signed)
PROGRESS NOTE    Rodney Friedman  [id number] DOB: [birth year] DOA: 01/24/2021 PCP: Kristie Cowman, MD    Brief Narrative:  Rodney Friedman is a 75 year old male with past medical history significant for aortic stenosis, CAD s/p CABG, paroxysmal atrial fibrillation, CLL in remission, type 2 diabetes mellitus, chronic diastolic congestive heart failure who presents to [facility] ED on 9/5 with progressive shortness of breath for the past 2 days.  Patient reports he may have missed some of his medications over the last week.  Patient was recently admitted for CHF exacerbation and underwent left heart catheterization on January 20, 2021; with recommendations to resume his home medications at that time.  Patient denies any chest pain, no productive cough, no fever/chills.    In the ED, temperature 9 7.4 F, HR 93, RR 24, BP 140/69, SPO2 94% on room air.  Sodium 141, potassium 4.1, chloride 111, CO2 21, BUN 21, creatinine 1.28, glucose 143.  BNP 672.5.  High sensitive troponin 30> 30, flat.  WBC 12.9, hemoglobin 12.6, platelets 191.  [name].  Influenza A/B PCR negative.Chest x-ray with cardiomegaly, hazy/interstitial coarsening consistent with pulmonary edema with small left pleural effusion.  Patient was started on furosemide 40 mg IV by EDP and consulted [facility] for further evaluation and management of acute on chronic diastolic congestive heart failure.   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  Patient is afebrile with mild elevated WC count of 12.9.  Chest x-ray with vascular congestion and small left pleural effusion.  BNP elevated 672.5.  Etiology likely secondary to CHF exacerbation in the setting of missed diuretic doses at home. --TTE 12/17/2020 with LVEF 60 to 65%, no LV regional wall motion abnormalities, indeterminate diastolic parameters, moderate aortic valve stenosis, IVC dilated. --Furosemide 20 mg IV every 12 hours --Strict I's and O's and daily weights --Monitor BMP daily  Aortic stenosis Has progressed to severe calcific aortic stenosis, currently being worked up for TAVR, appointment 9/13. --Outpatient follow-up with cardiology, Dr. Keene Breath  CAD s/p CABG --Continue statin, not on aspirin  Paroxysmal atrial fibrillation --Metoprolol succinate 50 mg p.o. twice daily --Continue Coumadin, pharmacy consulted for dosing/monitoring --INR daily  Type 2 diabetes mellitus Hemoglobin A1c 6.1, 12/17/2020, well controlled.  On metformin 500 mg p.o. twice daily at home. --Hold oral hypoglycemics while inpatient --SSI for coverage --CBGs qAC/HS  Dyslipidemia: --Crestor 20 mg p.o. daily --Zetia 10 mg p.o. daily  Peripheral neuropathy: Gabapentin milligrams p.o. daily, 200 mg p.o. nightly  Depression/anxiety: --Wellbutrin '200mg'$  PO qAM and '100mg'$  PO qPM  Chronic dyspnea Patient is followed by pulmonology outpatient, Dr. Erin Fulling.  Recently seen in office 01/14/2021.  At risk for obstructive lung disease with previous history of extensive tobacco use. --Continue LAMA  CLL in remission --Outpatient follow-up with medical oncology (Dr. Alvy Bimler),   Hx prostate cancer with metastasis to intrapelvic lymph node --Outpatient follow-up with urology   DVT prophylaxis:  warfarin (COUMADIN) tablet 4 mg    Code Status: DNR Family Communication:  No family present at bedside this morning.  Disposition Plan:  Level of care: Telemetry  Cardiac Status is: Observation  The patient remains OBS appropriate and will d/c before 2 midnights.  Dispo: The patient is from: Home              Anticipated d/c is to: Home              Patient currently is not medically stable to d/c.   Difficult to place patient No   Consultants:  None  Procedures:  None  Antimicrobials:  None   Subjective: Patient seen examined at bedside, resting comfortably.  Continues in ED holding area.  States breathing slightly improved with IV Lasix overnight.  But remains dyspneic which is different from his normal baseline.  Eating breakfast.  No other specific complaints or concerns at this time.  Denies headache, no chest pain, no palpitations, no abdominal pain, no fever/chills/night sweats, no nausea/vomiting/diarrhea, no weakness, no fatigue, no paresthesias.  No acute events overnight per nursing staff.  Objective: Vitals:   01/24/21 0407 01/24/21 0409 01/24/21 0600 01/24/21 1024  BP: 140/69  (!) 125/93 117/72  Pulse: 93  91 89  Resp: (!) 24  (!) 31 18  Temp: (!) 97.4 F (36.3 C)     TempSrc: Oral     SpO2: 94%  96% 96%  Weight:  93.4 kg    Height:  5' 8.5" (1.74 m)     No intake or output data in the 24 hours ending 01/24/21 1341 Filed Weights   01/24/21 0409  Weight: 93.4 kg    Examination:  General exam: Appears calm and comfortable, chronically ill in appearance Respiratory system: Decreased breath sounds bilateral bases with crackles, normal [place] effort without accessory muscle use, oxygenating well on room air. Cardiovascular system: S1 & S2 heard, RRR. No JVD, 3/6 SEM RUSB; No rubs, gallops or clicks. trace pedal edema. Gastrointestinal system: Abdomen is nondistended, soft and nontender. No organomegaly or masses felt. Normal bowel sounds heard. Central nervous system: Alert and oriented. No focal neurological deficits. Extremities: Symmetric 5 x 5 power. Skin: No rashes, lesions or ulcers Psychiatry: Judgement and  insight appear normal. Mood & affect appropriate.     Data Reviewed: I have personally reviewed following labs and imaging studies  CBC: Recent Labs  Lab 01/20/21 0902 01/20/21 0907 01/20/21 0908 01/24/21 0408 01/24/21 0453  WBC  --   --   --  12.9*  --   NEUTROABS  --   --   --  7.9*  --   HGB 11.2* 10.5* 11.2* 12.6* 11.6*  HCT 33.0* 31.0* 33.0* 37.9* 34.0*  MCV  --   --   --  91.1  --   PLT  --   --   --  191  --    Basic Metabolic Panel: Recent Labs  Lab 01/20/21 0902 01/20/21 0907 01/20/21 0908 01/24/21 0408 01/24/21 0453  NA 145 146* 145 141 145  K 4.0 3.8 4.0 4.1 4.0  CL  --   --   --  111 110  CO2  --   --   --  21*  --   GLUCOSE  --   --   --  143* 135*  BUN  --   --   --  21 19  CREATININE  --   --   --  1.28* 1.30*  CALCIUM  --   --   --  9.5  --  GFR: Estimated Creatinine Clearance: 56.6 mL/min (A) (by C-G formula based on SCr of 1.3 mg/dL (H)). Liver Function Tests: Recent Labs  Lab 01/24/21 0408  AST 30  ALT 31  ALKPHOS 32*  BILITOT 0.8  PROT 5.9*  ALBUMIN 3.4*   No results for input(s): LIPASE, AMYLASE in the last 168 hours. No results for input(s): AMMONIA in the last 168 hours. Coagulation Profile: Recent Labs  Lab 01/20/21 0659 01/21/21 1039 01/24/21 0716  INR 1.3* 1.3* 1.8*   Cardiac Enzymes: No results for input(s): CKTOTAL, CKMB, CKMBINDEX, TROPONINI in the last 168 hours. BNP (last 3 results) No results for input(s): PROBNP in the last 8760 hours. HbA1C: No results for input(s): HGBA1C in the last 72 hours. CBG: Recent Labs  Lab 01/20/21 0703 01/20/21 1300 01/24/21 0737 01/24/21 1123  GLUCAP 141* 128* 126* 125*   Lipid Profile: No results for input(s): CHOL, HDL, LDLCALC, TRIG, CHOLHDL, LDLDIRECT in the last 72 hours. Thyroid Function Tests: No results for input(s): TSH, T4TOTAL, FREET4, T3FREE, THYROIDAB in the last 72 hours. Anemia Panel: No results for input(s): VITAMINB12, FOLATE, FERRITIN, TIBC, IRON,  RETICCTPCT in the last 72 hours. Sepsis Labs: No results for input(s): PROCALCITON, LATICACIDVEN in the last 168 hours.  Recent Results (from the past 240 hour(s))  Resp Panel by RT-PCR (Flu A&B, Covid) Nasopharyngeal Swab     Status: None   Collection Time: 01/24/21  4:09 AM   Specimen: Nasopharyngeal Swab; Nasopharyngeal(NP) swabs in vial transport medium  Result Value Ref Range Status   SARS Coronavirus 2 by RT PCR NEGATIVE NEGATIVE Final    Comment: (NOTE) SARS-CoV-2 target nucleic acids are NOT DETECTED.  The SARS-CoV-2 RNA is generally detectable in upper respiratory specimens during the acute phase of infection. The lowest concentration of SARS-CoV-2 viral copies this assay can detect is 138 copies/mL. A negative result does not preclude SARS-Cov-2 infection and should not be used as the sole basis for treatment or other patient management decisions. A negative result may occur with  improper specimen collection/handling, submission of specimen other than nasopharyngeal swab, presence of viral mutation(s) within the areas targeted by this assay, and inadequate number of viral copies(<138 copies/mL). A negative result must be combined with clinical observations, patient history, and epidemiological information. The expected result is Negative.  Fact Sheet for Patients:  EntrepreneurPulse.com.au  Fact Sheet for Healthcare Providers:  IncredibleEmployment.be  This test is no t yet approved or cleared by the [place] FDA and  has been authorized for detection and/or diagnosis of SARS-CoV-2 by FDA under an Emergency Use Authorization (EUA). This EUA will remain  in effect (meaning this test can be used) for the duration of the COVID-19 declaration under Section 564(b)(1) of the Act, 21 U.S.C.section 360bbb-3(b)(1), unless the authorization is terminated  or revoked sooner.       Influenza A by PCR NEGATIVE NEGATIVE Final   Influenza  B by PCR NEGATIVE NEGATIVE Final    Comment: (NOTE) The Xpert Xpress SARS-CoV-2/FLU/RSV plus assay is intended as an aid in the diagnosis of influenza from Nasopharyngeal swab specimens and should not be used as a sole basis for treatment. Nasal washings and aspirates are unacceptable for Xpert Xpress SARS-CoV-2/FLU/RSV testing.  Fact Sheet for Patients: EntrepreneurPulse.com.au  Fact Sheet for Healthcare Providers: IncredibleEmployment.be  This test is not yet approved or cleared by the [place] FDA and has been authorized for detection and/or diagnosis of SARS-CoV-2 by FDA under an Emergency Use Authorization (EUA). This EUA will remain in  effect (meaning this test can be used) for the duration of the COVID-19 declaration under Section 564(b)(1) of the Act, 21 U.S.C. section 360bbb-3(b)(1), unless the authorization is terminated or revoked.  Performed at [facility], [place] [address], [place], [place] [address]          Radiology Studies: DG Chest Portable 1 View  Result Date: 01/24/2021 CLINICAL DATA:  Shortness of breath EXAM: PORTABLE CHEST 1 VIEW COMPARISON:  12/17/2020 FINDINGS: Cardiomegaly with hazy chest and interstitial coarsening. Small left pleural effusion. Prior median sternotomy. No pneumothorax. IMPRESSION: CHF pattern including small left pleural effusion Electronically Signed   By: Monte Fantasia M.D.   On: 01/24/2021 04:23        Scheduled Meds:  buPROPion ER  100 mg Oral Q supper   buPROPion ER  200 mg Oral Q24H   docusate sodium  100 mg Oral QPM   ezetimibe  10 mg Oral QHS   furosemide  20 mg Intravenous Q12H   gabapentin  100 mg Oral Daily   gabapentin  200 mg Oral QHS   insulin aspart  0-9 Units Subcutaneous TID WC   latanoprost  1 drop Left Eye QODAY   metoprolol succinate  50 mg Oral BID   omega-3 acid ethyl esters  1 g Oral Daily   rosuvastatin  20 mg Oral QHS   umeclidinium bromide  1 puff  Inhalation Daily   vitamin B-12  500 mcg Oral QODAY   warfarin  4 mg Oral ONCE-1600   Warfarin - Pharmacist Dosing Inpatient   Does not apply q1600   Continuous Infusions:  sodium chloride       LOS: 0 days    Time spent: 39 minutes spent on chart review, discussion with nursing staff, consultants, updating family and interview/physical exam; more than 50% of that time was spent in counseling and/or coordination of care.    Karrina Lye J [place] DO [facility] Available via Epic secure chat 7am-7pm After these hours, please refer to coverage provider listed on [email or web address] 01/24/2021, 1:41 PM

## 2021-01-24 NOTE — ED Notes (Signed)
Pt transported to floor via stretcher via monitor via transport.

## 2021-01-24 NOTE — ED Provider Notes (Signed)
[facility]PART[facility] Provider Note   CSN: [id number] Arrival date & time: 01/24/21  0400     History Chief Complaint  Patient presents with   Shortness of Breath    Complaining of shortness of breath that has kept him up since midnight    Erick Aye is a 75 y.o. male.  The history is provided by the patient.  Shortness of Breath Severity:  Severe Onset quality:  Sudden Duration: hours. Timing:  Constant Progression:  Worsening Chronicity:  New Context comment:  At rest Relieved by:  Nothing Worsened by:  Nothing Ineffective treatments:  None tried Associated symptoms: no claudication, no cough, no diaphoresis, no fever, no rash, no sore throat, no sputum production, no vomiting and no wheezing   Risk factors: no recent alcohol use and no family hx of DVT   Risk factors comment:  Med mamangement post cath     Past Medical History:  Diagnosis Date   Adenomatous colon polyp    Alcoholism ([place])    Anxiety    Atrial fibrillation (HCC)    CAD (coronary artery disease)    Cataract    removed left eye    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   Depression    Diabetes mellitus without complication (HCC)    Diverticulosis    Eye abnormality    Macular scarring R eye   Glaucoma    Heart murmur    HLD (hyperlipidemia)    Hypertension    Leukemia ([place])    CLL   Myocardial infarction ([place]) 2006   Prostate cancer ([place]) 07/08/2013   [facility] at [facility]- getting Lupron shot every 6 months - traces in prostate and 2 lymphnodes left hip- non focused traces per pt    Sleep apnea    Substance abuse ([place])    Tremor, essential 09/22/2015    Patient Active Problem List   Diagnosis Date Noted   Aortic stenosis 01/20/2021   CAP (community acquired pneumonia) 12/17/2020   Acute respiratory failure with hypoxia ([place]) 12/17/2020   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   Chronic pain 03/14/2019    Other social stressor 07/27/2018   Essential hypertension 07/27/2018   Diabetes mellitus type 2 in obese ([place]) 07/27/2018   Paroxysmal atrial fibrillation ([place]) 04/21/2016   Diabetes mellitus ([place]) 04/21/2016   OSA (obstructive sleep apnea) 04/21/2016   Quality of life palliative care encounter 01/03/2016   Bilateral carotid bruits without stenosis 01/02/2016   Tremor, essential 09/22/2015   Glaucoma, open angle 06/16/2015   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   Pseudoaphakia 06/15/2015   CD (contact dermatitis) 04/29/2014   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   Hematuria 01/05/2014   Skin lesion 01/05/2014   Aortic valve stenosis, nonrheumatic 01/03/2014   Hypertensive heart disease 01/03/2014   Mixed hyperlipidemia 01/03/2014   Mild obesity 01/03/2014   Tobacco abuse 01/03/2014   Chronic atrial fibrillation ([place]) 12/31/2013   Long term (current) use of anticoagulants 12/31/2013   Cataract, nuclear 10/06/2013   Dermatochalasis of eyelid 10/06/2013   Chorioretinal scar, 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   Coronary artery disease of native artery of native heart with stable angina pectoris ([place]) 09/03/2013   Pre-diabetes 09/03/2013   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   Prostate cancer ([place]) 07/08/2013    Past Surgical History:  Procedure Laterality Date   Arm Surgery Right    from door accident with glass   CATARACT EXTRACTION W/ INTRAOCULAR LENS IMPLANT Bilateral  COLONOSCOPY     CORONARY ARTERY BYPASS GRAFT  08/04/2004   POLYPECTOMY     RIGHT/LEFT HEART CATH AND CORONARY/GRAFT ANGIOGRAPHY N/A 01/20/2021   Procedure: RIGHT/LEFT HEART CATH AND CORONARY/GRAFT ANGIOGRAPHY;  Surgeon: Burnell Blanks, MD;  Location: [facility];  Service: Cardiovascular;  Laterality: N/A;       Family History  Problem Relation Age of Onset   Colon cancer Mother    Ovarian cancer Mother    Uterine cancer Mother    Hypertension Father    Prostate cancer Father     Colon polyps Neg Hx     Social History   Tobacco Use   Smoking status: Former    Packs/day: 0.50    Years: 50.00    Pack years: 25.00    Types: Cigarettes    Quit date: 12/16/2020    Years since quitting: 0.1   Smokeless tobacco: Never  Vaping Use   Vaping Use: Never used  Substance Use Topics   Alcohol use: No    Alcohol/week: 0.0 standard drinks   Drug use: No    Home Medications Prior to Admission medications   Medication Sig Start Date End Date Taking? Authorizing Provider  B Complex Vitamins (VITAMIN-B COMPLEX PO) Take 1 tablet by mouth daily with supper.   Yes [provider]  BIOTIN PO Take 1 tablet by mouth every evening.   Yes [provider]  buPROPion ER (WELLBUTRIN SR) 100 MG 12 hr tablet 2  qam   1  q diner Patient taking differently: Take 100-200 mg by mouth See admin instructions. 200 mg in the morning 100 mg in the evening 12/15/20  Yes Plovsky, Berneta Sages, MD  Calcium Carbonate-Vit D-Min (CALCIUM 1200 PO) Take 1,200 mg by mouth every evening.   Yes [provider]  calcium elemental as carbonate (TUMS ULTRA 1000) 400 MG chewable tablet Chew 1,000 mg by mouth daily as needed for heartburn.   Yes [provider]  docusate sodium (COLACE) 100 MG capsule Take 100 mg by mouth every evening.   Yes [provider]  ezetimibe (ZETIA) 10 MG tablet Take 1 tablet (10 mg total) by mouth daily. Patient taking differently: Take 10 mg by mouth at bedtime. 03/31/20  Yes Croitoru, Mihai, MD  furosemide (LASIX) 20 MG tablet Take 1 tablet (20 mg total) by mouth daily. 01/17/21  Yes Croitoru, Mihai, MD  gabapentin (NEURONTIN) 100 MG capsule 3  qhs Patient taking differently: Take 100-200 mg by mouth See admin instructions. Take 100 mg in the morning and 200 mg at night 12/15/20  Yes Plovsky, Berneta Sages, MD  ketoconazole (NIZORAL) 2 % shampoo Apply 1 application topically 2 (two) times a week. Patient taking differently: Apply 1 application  topically every Monday, Wednesday, and Friday. 04/26/20  Yes Wendie Agreste, MD  latanoprost (XALATAN) 0.005 % ophthalmic solution Place 1 drop into the left eye every other day. 06/30/13  Yes [provider]  LYCOPENE PO Take 10 mg by mouth every other day.   Yes [provider]  Magnesium 250 MG TABS Take 250 mg by mouth every 3 (three) days.   Yes [provider]  metFORMIN (GLUCOPHAGE) 500 MG tablet Take 1 tablet (500 mg total) by mouth 2 (two) times daily with a meal. 04/26/20  Yes Wendie Agreste, MD  metoprolol (TOPROL-XL) 50 MG 24 hr tablet Take 1 tablet (50 mg total) by mouth 2 (two) times daily. Take with or immediately following a meal. 12/20/20  Yes  Dessa Phi, DO  Omega-3 Fatty Acids (FISH OIL) 1000 MG CAPS Take 1,000 mg by mouth daily with supper.   Yes [provider]  rosuvastatin (CRESTOR) 20 MG tablet Take 1 tablet (20 mg total) by mouth daily. Patient taking differently: Take 20 mg by mouth at bedtime. 03/31/20  Yes Croitoru, Mihai, MD  Sodium Fluoride (PREVIDENT 5000 DRY MOUTH DT) Place 1 application onto teeth See admin instructions. Apply to gums every night for dry mouth   Yes [provider]  umeclidinium bromide (INCRUSE ELLIPTA) 62.5 MCG/INH AEPB Inhale 1 puff into the lungs daily. 01/14/21  Yes Freddi Starr, MD  vitamin B-12 (CYANOCOBALAMIN) 500 MCG tablet Take 500 mcg by mouth every other day.   Yes [provider]  vitamin C (ASCORBIC ACID) 500 MG tablet Take 500-1,000 mg by mouth See admin instructions. Alternate taking 500 mg one day and 1000 mg the next   Yes [provider]  Vitamin D, Ergocalciferol, (DRISDOL) 1.25 MG (50000 UNIT) CAPS capsule Take 50,000 Units by mouth every Saturday.   Yes [provider]  warfarin (COUMADIN) 4 MG tablet TAKE 1/2 TO 1 TABLET DAILY AS DIRECTED BY COUMADIN CLINIC Patient taking differently: Take 2-4 mg by mouth See admin instructions. 2 mg on Tuesday and  Friday 4 mg on Sunday,Monday,Wednesday,Thursday and saturday 07/05/20  Yes Croitoru, Mihai, MD    Allergies    Other  Review of Systems   Review of Systems  Constitutional:  Negative for diaphoresis and fever.  HENT:  Negative for sore throat.   Eyes:  Negative for redness.  Respiratory:  Positive for shortness of breath. Negative for cough, sputum production and wheezing.   Cardiovascular:  Negative for palpitations, claudication and leg swelling.  Gastrointestinal:  Negative for vomiting.  Genitourinary:  Negative for difficulty urinating.  Musculoskeletal:  Negative for neck stiffness.  Skin:  Negative for rash.  Neurological:  Negative for facial asymmetry.  Psychiatric/Behavioral:  Negative for agitation.   All other systems reviewed and are negative.  Physical Exam Updated Vital Signs BP 140/69 (BP Location: Right Arm)   Pulse 93   Temp (!) 97.4 F (36.3 C) (Oral)   Resp (!) 24   Ht 5' 8.5" (1.74 m)   Wt 93.4 kg   SpO2 94%   BMI 30.87 kg/m   Physical Exam Vitals and nursing note reviewed.  Constitutional:      Appearance: Normal appearance. He is not diaphoretic.  HENT:     Head: Normocephalic and atraumatic.     Nose: Nose normal.  Eyes:     Conjunctiva/sclera: Conjunctivae normal.     Pupils: Pupils are equal, round, and reactive to light.  Cardiovascular:     Rate and Rhythm: Normal rate and regular rhythm.     Pulses: Normal pulses.  Pulmonary:     Breath sounds: Wheezing and rales present.  Abdominal:     General: Abdomen is flat. Bowel sounds are normal.     Palpations: Abdomen is soft.     Tenderness: There is no abdominal tenderness. There is no guarding.  Musculoskeletal:        General: No tenderness. Normal range of motion.     Cervical back: Normal range of motion and neck supple.  Skin:    General: Skin is warm and dry.     Capillary Refill: Capillary refill takes less than 2 seconds.  Neurological:     General: No focal deficit present.      Mental Status:  He is alert.     Deep Tendon Reflexes: Reflexes normal.  Psychiatric:        Mood and Affect: Mood normal.        Behavior: Behavior normal.    ED Results / Procedures / Treatments   Labs (all labs ordered are listed, but only abnormal results are displayed) Labs Reviewed  CBC WITH DIFFERENTIAL/PLATELET - Abnormal; Notable for the following components:      Result Value   WBC 12.9 (*)    RBC 4.16 (*)    Hemoglobin 12.6 (*)    HCT 37.9 (*)    All other components within normal limits  I-STAT CHEM 8, ED - Abnormal; Notable for the following components:   Creatinine, Ser 1.30 (*)    Glucose, Bld 135 (*)    Hemoglobin 11.6 (*)    HCT 34.0 (*)    All other components within normal limits  RESP PANEL BY RT-PCR (FLU A&B, COVID) [id number]  COMPREHENSIVE METABOLIC PANEL  BRAIN NATRIURETIC PEPTIDE  TROPONIN I (HIGH SENSITIVITY)    EKG EKG Interpretation  Date/Time:  Monday January 24 2021 04:00:53 EDT Ventricular Rate:  97 PR Interval:    QRS Duration: 124 QT Interval:  394 QTC Calculation: 501 R Axis:   -24 Text Interpretation: Atrial fibrillation Ventricular bigeminy LVH with IVCD and secondary repol abnrm Prolonged QT interval Confirmed by Dory Horn) on 01/24/2021 5:14:03 AM  Radiology DG Chest Portable 1 View  Result Date: 01/24/2021 CLINICAL DATA:  Shortness of breath EXAM: PORTABLE CHEST 1 VIEW COMPARISON:  12/17/2020 FINDINGS: Cardiomegaly with hazy chest and interstitial coarsening. Small left pleural effusion. Prior median sternotomy. No pneumothorax. IMPRESSION: CHF pattern including small left pleural effusion Electronically Signed   By: Monte Fantasia M.D.   On: 01/24/2021 04:23    Procedures Procedures   Medications Ordered in ED Medications  furosemide (LASIX) injection 40 mg (40 mg Intravenous Given 01/24/21 0423)    ED Course  I have reviewed the triage vital signs and the nursing notes.  Pertinent labs & imaging results that  were available during my care of the patient were reviewed by me and considered in my medical decision making (see chart for details).    Taos Lannen was evaluated in Emergency Department on 01/24/2021 for the symptoms described in the history of present illness. He was evaluated in the context of the global COVID-19 pandemic, which necessitated consideration that the patient might be at risk for infection with the SARS-CoV-2 virus that causes COVID-19. Institutional protocols and algorithms that pertain to the evaluation of patients at risk for COVID-19 are in a state of rapid change based on information released by regulatory bodies including the CDC and federal and state organizations. These policies and algorithms were followed during the patient's care in the ED.  Final Clinical Impression(s) / ED Diagnoses Final diagnoses:  Acute pulmonary edema (HCC)  Pleural effusion   Admit to medicine  Rx / DC Orders ED Discharge Orders     None        Riggin Cuttino, MD 01/24/21 [id number]

## 2021-01-24 NOTE — ED Triage Notes (Signed)
Pt is alert and oriented, has an oxygen saturation of 94 on room air, does not have oxygen at home, said oxygen makes him feel better

## 2021-01-24 NOTE — ED Notes (Signed)
Waiting on transport to pick up pt. Pt sitting on edge of bed.

## 2021-01-24 NOTE — H&P (Signed)
History and Physical    Rodney Friedman [id number] DOB: [birth year] DOA: 01/24/2021  PCP: Kristie Cowman, MD  Patient coming from: Home.  Chief Complaint: Shortness of breath.  HPI: Rodney Friedman is a 75 y.o. male with known history of aortic stenosis, CAD status post CABG, A. fib, CLL in remission, diabetes mellitus presents to the ER because of worsening shortness of breath over the last 2 days.  Patient had a cardiac cath on January 20, 2021.  At that time it was recommended continue home medications.  Patient states that he may have missed some of his medications during the process.  Patient was admitted last month for CHF exacerbation.  Denies any chest pain productive cough fever or chills.  Last 2D echo done in December 18, 2018 showed EF of 60 to 65% with increasing mean gradient across aortic valve.  ED Course: In the ER patient appears short of breath with chest x-ray showing CHF pattern with pleural effusion.  Labs show BNP of 672 high sensitive troponin of 13 WBC count of 12.9 creatinine of 1.2.  Patient was given Lasix 40 mg IV admitted for further work-up of CHF.  COVID test was negative.  Review of Systems: As per HPI, rest all negative.   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   Depression    Diabetes mellitus without complication (HCC)    Diverticulosis    Eye abnormality    Macular scarring R eye   Glaucoma    Heart murmur    HLD (hyperlipidemia)    Hypertension    Leukemia ([place])    CLL   Myocardial infarction ([place]) 2006   Prostate cancer ([place]) 07/08/2013   [facility]- getting Lupron shot every 6 months - traces in prostate and 2 lymphnodes left hip- non focused traces per pt    Sleep apnea    Substance abuse ([place])    Tremor, essential 09/22/2015     Past Surgical History:  Procedure Laterality Date   Arm Surgery Right    from door accident with glass   CATARACT EXTRACTION W/ INTRAOCULAR LENS IMPLANT Bilateral    COLONOSCOPY     CORONARY ARTERY BYPASS GRAFT  08/04/2004   POLYPECTOMY     RIGHT/LEFT HEART CATH AND CORONARY/GRAFT ANGIOGRAPHY N/A 01/20/2021   Procedure: RIGHT/LEFT HEART CATH AND CORONARY/GRAFT ANGIOGRAPHY;  Surgeon: Burnell Blanks, MD;  Location: [facility];  Service: Cardiovascular;  Laterality: N/A;     reports that he quit smoking about 5 weeks ago. His smoking use included cigarettes. He has a 25.00 pack-year smoking history. He has never used smokeless tobacco. He reports that he does not drink alcohol and does not use drugs.  Allergies  Allergen Reactions   Other Rash    Allergen: "Plants and bushes while doing yard work"    Family History  Problem Relation Age of Onset   Colon cancer Mother    Ovarian cancer Mother    Uterine cancer Mother    Hypertension Father    Prostate cancer Father    Colon polyps Neg Hx     Prior to Admission medications   Medication Sig Start Date End Date Taking? Authorizing Provider  B Complex Vitamins (VITAMIN-B COMPLEX PO)  Take 1 tablet by mouth daily with supper.   Yes [provider]  BIOTIN PO Take 1 tablet by mouth every evening.   Yes [provider]  buPROPion ER (WELLBUTRIN SR) 100 MG 12 hr tablet 2  qam   1  q diner Patient taking differently: Take 100-200 mg by mouth See admin instructions. 200 mg in the morning 100 mg in the evening 12/15/20  Yes Plovsky, Berneta Sages, MD  Calcium Carbonate-Vit D-Min (CALCIUM 1200 PO) Take 1,200 mg by mouth every evening.   Yes [provider]  calcium elemental as carbonate (TUMS ULTRA 1000) 400 MG chewable tablet Chew 1,000 mg by mouth daily as needed for heartburn.   Yes [provider]  docusate sodium (COLACE) 100 MG capsule Take 100 mg by mouth every evening.   Yes [provider]  ezetimibe (ZETIA) 10 MG tablet Take 1 tablet (10 mg total) by mouth daily. Patient taking differently: Take 10 mg by mouth at bedtime. 03/31/20  Yes Croitoru, Mihai, MD  furosemide (LASIX) 20 MG tablet Take 1 tablet (20 mg total) by mouth daily. 01/17/21  Yes Croitoru, Mihai, MD  gabapentin (NEURONTIN) 100 MG capsule 3  qhs Patient taking differently: Take 100-200 mg by mouth See admin instructions. Take 100 mg in the morning and 200 mg at night 12/15/20  Yes Plovsky, Berneta Sages, MD  ketoconazole (NIZORAL) 2 % shampoo Apply 1 application topically 2 (two) times a week. Patient taking differently: Apply 1 application topically every Monday, Wednesday, and Friday. 04/26/20  Yes Wendie Agreste, MD  latanoprost (XALATAN) 0.005 % ophthalmic solution Place 1 drop into the left eye every other day. 06/30/13  Yes [provider]  LYCOPENE PO Take 10 mg by mouth every other day.   Yes [provider]  Magnesium 250 MG TABS Take 250 mg by mouth every 3 (three) days.   Yes [provider]  metFORMIN (GLUCOPHAGE) 500 MG tablet Take 1 tablet (500 mg total) by mouth 2 (two) times daily with a meal. 04/26/20  Yes Wendie Agreste, MD  metoprolol (TOPROL-XL) 50 MG 24 hr tablet Take 1 tablet (50 mg total) by mouth 2 (two) times daily. Take with or immediately following a meal. 12/20/20  Yes Dessa Phi, DO  Omega-3 Fatty Acids (FISH OIL) 1000 MG CAPS Take 1,000 mg by mouth daily with supper.   Yes [provider]  rosuvastatin (CRESTOR) 20 MG tablet Take 1 tablet (20 mg total) by mouth daily. Patient taking differently: Take 20 mg by mouth at bedtime. 03/31/20  Yes Croitoru, Mihai, MD  Sodium Fluoride (PREVIDENT 5000 DRY MOUTH DT) Place 1 application onto teeth See admin instructions. Apply to gums every night for dry mouth   Yes [provider]  umeclidinium bromide (INCRUSE ELLIPTA) 62.5 MCG/INH AEPB Inhale 1 puff into the lungs daily. 01/14/21  Yes Freddi Starr, MD  vitamin B-12 (CYANOCOBALAMIN) 500 MCG tablet Take 500 mcg by mouth every other day.   Yes [provider]  vitamin C (ASCORBIC ACID) 500 MG tablet Take 500-1,000 mg by mouth See admin instructions. Alternate taking 500 mg one day and 1000 mg the next   Yes [provider]  Vitamin D, Ergocalciferol, (DRISDOL) 1.25 MG (50000 UNIT) CAPS capsule Take 50,000 Units by mouth every Saturday.   Yes [provider]  warfarin (COUMADIN) 4 MG tablet TAKE 1/2 TO 1 TABLET DAILY AS DIRECTED BY COUMADIN CLINIC Patient taking differently: Take 2-4 mg by mouth See  admin instructions. 2 mg on Tuesday and Friday 4 mg on Sunday,Monday,Wednesday,Thursday and saturday 07/05/20  Yes Croitoru, Dani Gobble, MD    Physical Exam: Constitutional: Moderately built and nourished. Vitals:   01/24/21 0405 01/24/21 0407 01/24/21 0409 01/24/21 0600  BP:  140/69  (!) 125/93  Pulse:  93  91  Resp:  (!) 24  (!) 31  Temp:  (!) 97.4 F (36.3 C)    TempSrc:  Oral    SpO2: 95% 94%  96%  Weight:   93.4 kg   Height:   5' 8.5" (1.74 m)    Eyes: Anicteric no pallor. ENMT: No discharge from the ears eyes nose and mouth. Neck: No mass felt.  No neck rigidity. Respiratory: No rhonchi or crepitations. Cardiovascular: S1-S2 heard. Abdomen: Soft nontender bowel sounds present. Musculoskeletal: No edema. Skin: No rash. Neurologic: Alert awake oriented to time place and person.  Moves all extremities. Psychiatric: Appears normal.  Normal affect.   Labs on Admission: I have personally reviewed following labs and imaging studies  CBC: Recent Labs  Lab 01/20/21 0902 01/20/21 0907 01/20/21 0908 01/24/21 0408 01/24/21 0453  WBC  --   --   --  12.9*  --   NEUTROABS  --   --   --  7.9*  --   HGB 11.2* 10.5* 11.2* 12.6* 11.6*  HCT 33.0* 31.0* 33.0* 37.9* 34.0*  MCV  --   --   --  91.1  --   PLT  --   --   --  191  --    Basic Metabolic Panel: Recent Labs  Lab 01/20/21 0902  01/20/21 0907 01/20/21 0908 01/24/21 0408 01/24/21 0453  NA 145 146* 145 141 145  K 4.0 3.8 4.0 4.1 4.0  CL  --   --   --  111 110  CO2  --   --   --  21*  --   GLUCOSE  --   --   --  143* 135*  BUN  --   --   --  21 19  CREATININE  --   --   --  1.28* 1.30*  CALCIUM  --   --   --  9.5  --    GFR: Estimated Creatinine Clearance: 56.6 mL/min (A) (by C-G formula based on SCr of 1.3 mg/dL (H)). Liver Function Tests: Recent Labs  Lab 01/24/21 0408  AST 30  ALT 31  ALKPHOS 32*  BILITOT 0.8  PROT 5.9*  ALBUMIN 3.4*   No results for input(s): LIPASE, AMYLASE in the last 168 hours. No results for input(s): AMMONIA in the last 168 hours. Coagulation Profile: Recent Labs  Lab 01/20/21 0659 01/21/21 1039  INR 1.3* 1.3*   Cardiac Enzymes: No results for input(s): CKTOTAL, CKMB, CKMBINDEX, TROPONINI in the last 168 hours. BNP (last 3 results) No results for input(s): PROBNP in the last 8760 hours. HbA1C: No results for input(s): HGBA1C in the last 72 hours. CBG: Recent Labs  Lab 01/20/21 0703 01/20/21 1300  GLUCAP 141* 128*   Lipid Profile: No results for input(s): CHOL, HDL, LDLCALC, TRIG, CHOLHDL, LDLDIRECT in the last 72 hours. Thyroid Function Tests: No results for input(s): TSH, T4TOTAL, FREET4, T3FREE, THYROIDAB in the last 72 hours. Anemia Panel: No results for input(s): VITAMINB12, FOLATE, FERRITIN, TIBC, IRON, RETICCTPCT in the last 72 hours. Urine analysis:    Component Value Date/Time   APPEARANCEUR Clear 02/09/2017 1238   GLUCOSEU 3+ (A) 02/09/2017 1238   BILIRUBINUR Negative 02/09/2017  1238   PROTEINUR Negative 02/09/2017 1238   UROBILINOGEN 0.2 06/04/2014 1539   NITRITE Negative 02/09/2017 1238   LEUKOCYTESUR Negative 02/09/2017 1238   Sepsis Labs: '@LABRCNTIP'$ (procalcitonin:4,lacticidven:4) ) Recent Results (from the past 240 hour(s))  Resp Panel by RT-PCR (Flu A&B, Covid) Nasopharyngeal Swab     Status: None   Collection Time: 01/24/21  4:09 AM    Specimen: Nasopharyngeal Swab; Nasopharyngeal(NP) swabs in vial transport medium  Result Value Ref Range Status   SARS Coronavirus 2 by RT PCR NEGATIVE NEGATIVE Final    Comment: (NOTE) SARS-CoV-2 target nucleic acids are NOT DETECTED.  The SARS-CoV-2 RNA is generally detectable in upper respiratory specimens during the acute phase of infection. The lowest concentration of SARS-CoV-2 viral copies this assay can detect is 138 copies/mL. A negative result does not preclude SARS-Cov-2 infection and should not be used as the sole basis for treatment or other patient management decisions. A negative result may occur with  improper specimen collection/handling, submission of specimen other than nasopharyngeal swab, presence of viral mutation(s) within the areas targeted by this assay, and inadequate number of viral copies(<138 copies/mL). A negative result must be combined with clinical observations, patient history, and epidemiological information. The expected result is Negative.  Fact Sheet for Patients:  EntrepreneurPulse.com.au  Fact Sheet for Healthcare Providers:  IncredibleEmployment.be  This test is no t yet approved or cleared by the [place] FDA and  has been authorized for detection and/or diagnosis of SARS-CoV-2 by FDA under an Emergency Use Authorization (EUA). This EUA will remain  in effect (meaning this test can be used) for the duration of the COVID-19 declaration under Section 564(b)(1) of the Act, 21 U.S.C.section 360bbb-3(b)(1), unless the authorization is terminated  or revoked sooner.       Influenza A by PCR NEGATIVE NEGATIVE Final   Influenza B by PCR NEGATIVE NEGATIVE Final    Comment: (NOTE) The Xpert Xpress SARS-CoV-2/FLU/RSV plus assay is intended as an aid in the diagnosis of influenza from Nasopharyngeal swab specimens and should not be used as a sole basis for treatment. Nasal washings and aspirates are  unacceptable for Xpert Xpress SARS-CoV-2/FLU/RSV testing.  Fact Sheet for Patients: EntrepreneurPulse.com.au  Fact Sheet for Healthcare Providers: IncredibleEmployment.be  This test is not yet approved or cleared by the [place] FDA and has been authorized for detection and/or diagnosis of SARS-CoV-2 by FDA under an Emergency Use Authorization (EUA). This EUA will remain in effect (meaning this test can be used) for the duration of the COVID-19 declaration under Section 564(b)(1) of the Act, 21 U.S.C. section 360bbb-3(b)(1), unless the authorization is terminated or revoked.  Performed at [facility], [place] [address], [place], [place] [address]      Radiological Exams on Admission: DG Chest Portable 1 View  Result Date: 01/24/2021 CLINICAL DATA:  Shortness of breath EXAM: PORTABLE CHEST 1 VIEW COMPARISON:  12/17/2020 FINDINGS: Cardiomegaly with hazy chest and interstitial coarsening. Small left pleural effusion. Prior median sternotomy. No pneumothorax. IMPRESSION: CHF pattern including small left pleural effusion Electronically Signed   By: Monte Fantasia M.D.   On: 01/24/2021 04:23    EKG: Independently reviewed.  A. fib rate controlled.  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  Patient received 40 mg IV Lasix.  I have placed patient on Lasix 20 mg IV every 12.  Closely follow intake output and metabolic panel.  Patient is being  followed by cardiologist for aortic stenosis and TAVR work-up. CAD status post CABG denies any chest pain.  Patient is on statins Coumadin and beta-blockers. 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  Likely chronic kidney disease.  Follow metabolic panel. Chronic A. fib on beta-blockers and Coumadin. Hypertension on beta-blockers. Diabetes mellitus type 2 on sliding scale coverage. History of CLL. Normocytic normochromic anemia follow CBC. Recent cardiac cath on January 20, 2021.   DVT prophylaxis: Coumadin. Code Status: DNR. Family Communication: Discussed with patient. Disposition Plan: Home. Consults called: None. Admission status: Observation.   Rise Patience MD [facility] Pager [phone].  If 7PM-7AM, please contact night-coverage [email or web address] Password [facility]  01/24/2021, 6:29 AM

## 2021-01-24 NOTE — Progress Notes (Signed)
[place] for Coumadin Indication: atrial fibrillation  Allergies  Allergen Reactions   Other Rash    Allergen: "Plants and bushes while doing yard work"    Patient Measurements: Height: 5' 8.5" (174 cm) Weight: 93.4 kg (206 lb) IBW/kg (Calculated) : 69.55  Vital Signs: Temp: 97.4 F (36.3 C) (09/05 0407) Temp Source: Oral (09/05 0407) BP: 125/93 (09/05 0600) Pulse Rate: 91 (09/05 0600)  Labs: Recent Labs    01/21/21 1039 01/24/21 0408 01/24/21 0453 01/24/21 0716  HGB  --  12.6* 11.6*  --   HCT  --  37.9* 34.0*  --   PLT  --  191  --   --   LABPROT 16.1*  --   --  21.0*  INR 1.3*  --   --  1.8*  CREATININE  --  1.28* 1.30*  --   TROPONINIHS  --  30*  --  30*     Estimated Creatinine Clearance: 56.6 mL/min (A) (by C-G formula based on SCr of 1.3 mg/dL (H)).   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   Depression    Diabetes mellitus without complication (HCC)    Diverticulosis    Eye abnormality    Macular scarring R eye   Glaucoma    Heart murmur    HLD (hyperlipidemia)    Hypertension    Leukemia ([place])    CLL   Myocardial infarction ([place]) 2006   Prostate cancer ([place]) 07/08/2013   [facility]- getting Lupron shot every 6 months - traces in prostate and 2 lymphnodes left hip- non focused traces per pt    Sleep apnea    Substance abuse ([place])    Tremor, essential 09/22/2015    Assessment: 75yo male recently discharged after stay for severe aortic stenosis, s/p cath 9/1 and to continue TAVR workup outpatient, now c/o SOB that has kept him awake, admitted for acute pulmonary edema, to continue Coumadin for Afib; last dose of Coumadin taken 9/4 AM.  Admit INR slightly subtherapeutic at  1.8 (trend up from 9/1 when resumed after holding pre-cath). CBC stable. No current active bleed issues reported.  PTA warfarin dose - '4mg'$  daily except '2mg'$  Tues/Fri  Goal of Therapy:  INR 2-3   Plan:  Warfarin '4mg'$  PO x 1 dose at 1600 Monitor daily INR, CBC, s/sx bleeding F/u TAVR plans   Arturo Morton, PharmD, BCPS Please check AMION for all [place] contact numbers Clinical Pharmacist 01/24/2021 8:17 AM

## 2021-01-24 NOTE — Progress Notes (Signed)
ANTICOAGULATION CONSULT NOTE - Initial Consult  Pharmacy Consult for Coumadin Indication: atrial fibrillation  Allergies  Allergen Reactions   Other Rash    Allergen: "Plants and bushes while doing yard work"    Patient Measurements: Height: 5' 8.5" (174 cm) Weight: 93.4 kg (206 lb) IBW/kg (Calculated) : 69.55  Vital Signs: Temp: 97.4 F (36.3 C) (09/05 0407) Temp Source: Oral (09/05 0407) BP: 125/93 (09/05 0600) Pulse Rate: 91 (09/05 0600)  Labs: Recent Labs    01/21/21 1039 01/24/21 0408 01/24/21 0453  HGB  --  12.6* 11.6*  HCT  --  37.9* 34.0*  PLT  --  191  --   LABPROT 16.1*  --   --   INR 1.3*  --   --   CREATININE  --  1.28* 1.30*  TROPONINIHS  --  30*  --     Estimated Creatinine Clearance: 56.6 mL/min (A) (by C-G formula based on SCr of 1.3 mg/dL (H)).   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   Depression    Diabetes mellitus without complication (HCC)    Diverticulosis    Eye abnormality    Macular scarring R eye   Glaucoma    Heart murmur    HLD (hyperlipidemia)    Hypertension    Leukemia ([place])    CLL   Myocardial infarction ([place]) 2006   Prostate cancer ([place]) 07/08/2013   [facility]- getting Lupron shot every 6 months - traces in prostate and 2 lymphnodes left hip- non focused traces per pt    Sleep apnea    Substance abuse ([place])    Tremor, essential 09/22/2015    Assessment: 75yo male recently discharged after stay for severe aortic stenosis now c/o SOB that has kept him awake, admitted for acute pulmonary edema, to continue Coumadin for Afib; last dose of Coumadin taken 9/4 am, INR pending.  Goal of Therapy:  INR 2-3   Plan:  Monitor INR for dose adjustments.  Wynona Neat, PharmD, BCPS   01/24/2021,6:43 AM

## 2021-01-24 NOTE — Telephone Encounter (Signed)
FYI for JD.    Prox Cx lesion is 99% stenosed.   Prox Cx to Mid Cx lesion is 100% stenosed.   Prox LAD lesion is 70% stenosed.   Mid LAD lesion is 100% stenosed.   Prox RCA lesion is 100% stenosed.   SVG graft was visualized by angiography and is large.   LIMA graft was visualized by angiography.   SVG graft was visualized by angiography and is large.   The graft exhibits no disease.   The graft exhibits no disease.   The graft exhibits no disease.   Severe triple vessel CAD s/p 3V CABG with 3/3 patent bypass grafts 100% mid LAD occlusion. The mid and distal LAD fills from the patent LIMA graft. The Diagonal branch fills from the patent vein graft.  100% mid Circumflex occlusion. The vein graft to the OM branch is patent The RCA has chronic proximal total occlusion. The distal vessel fills from left to right collaterals through the vein graft to OM.  Severe aortic stenosis by echo (by cath the mean gradient is 32 mmhg, peak to peak gradient is 38 mmHg, AVA 1.0 cm2).    Recommendations: Will continue medical management of CAD. Continue workup for TAVR.

## 2021-01-25 DIAGNOSIS — E1169 Type 2 diabetes mellitus with other specified complication: Secondary | ICD-10-CM | POA: Diagnosis not present

## 2021-01-25 DIAGNOSIS — I350 Nonrheumatic aortic (valve) stenosis: Secondary | ICD-10-CM | POA: Diagnosis not present

## 2021-01-25 DIAGNOSIS — C61 Malignant neoplasm of prostate: Secondary | ICD-10-CM | POA: Diagnosis not present

## 2021-01-25 DIAGNOSIS — C9110 Chronic lymphocytic leukemia of B-cell type not having achieved remission: Secondary | ICD-10-CM | POA: Diagnosis not present

## 2021-01-25 DIAGNOSIS — E669 Obesity, unspecified: Secondary | ICD-10-CM | POA: Diagnosis not present

## 2021-01-25 DIAGNOSIS — I25118 Atherosclerotic heart disease of native coronary artery with other forms of angina pectoris: Secondary | ICD-10-CM | POA: Diagnosis not present

## 2021-01-25 DIAGNOSIS — I509 Heart failure, unspecified: Secondary | ICD-10-CM | POA: Diagnosis not present

## 2021-01-25 DIAGNOSIS — C775 Secondary and unspecified malignant neoplasm of intrapelvic lymph nodes: Secondary | ICD-10-CM | POA: Diagnosis not present

## 2021-01-25 DIAGNOSIS — G250 Essential tremor: Secondary | ICD-10-CM | POA: Diagnosis not present

## 2021-01-25 DIAGNOSIS — I482 Chronic atrial fibrillation, unspecified: Secondary | ICD-10-CM | POA: Diagnosis not present

## 2021-01-25 DIAGNOSIS — I5033 Acute on chronic diastolic (congestive) heart failure: Secondary | ICD-10-CM | POA: Diagnosis not present

## 2021-01-25 LAB — BASIC METABOLIC PANEL
Anion gap: 6 (ref 5–15)
BUN: 24 mg/dL — ABNORMAL HIGH (ref 8–23)
CO2: 25 mmol/L (ref 22–32)
Calcium: 9.3 mg/dL (ref 8.9–10.3)
Chloride: 110 mmol/L (ref 98–111)
Creatinine, Ser: 1.33 mg/dL — ABNORMAL HIGH (ref 0.61–1.24)
GFR, Estimated: 56 mL/min — ABNORMAL LOW (ref >60)
Glucose, Bld: 117 mg/dL — ABNORMAL HIGH (ref 70–99)
Potassium: 3.6 mmol/L (ref 3.5–5.1)
Sodium: 141 mmol/L (ref 135–145)

## 2021-01-25 LAB — CBC
HCT: 35.8 % — ABNORMAL LOW (ref 39.0–52.0)
Hemoglobin: 12 g/dL — ABNORMAL LOW (ref 13.0–17.0)
MCH: 30.2 pg (ref 26.0–34.0)
MCHC: 33.5 g/dL (ref 30.0–36.0)
MCV: 90.2 fL (ref 80.0–100.0)
Platelets: 173 10*3/uL (ref 150–400)
RBC: 3.97 MIL/uL — ABNORMAL LOW (ref 4.22–5.81)
RDW: 14.9 % (ref 11.5–15.5)
WBC: 11.7 10*3/uL — ABNORMAL HIGH (ref 4.0–10.5)
nRBC: 0 % (ref 0.0–0.2)

## 2021-01-25 LAB — GLUCOSE, CAPILLARY
Glucose-Capillary: 137 mg/dL — ABNORMAL HIGH (ref 70–99)
Glucose-Capillary: 126 mg/dL — ABNORMAL HIGH (ref 70–99)

## 2021-01-25 LAB — PROTIME-INR
INR: 1.9 — ABNORMAL HIGH (ref 0.8–1.2)
Prothrombin Time: 22 seconds — ABNORMAL HIGH (ref 11.4–15.2)

## 2021-01-25 MED ORDER — WARFARIN SODIUM 4 MG PO TABS
4.0000 mg | ORAL_TABLET | Freq: Once | ORAL | Status: DC
  Filled 2021-01-25: qty 1

## 2021-01-25 NOTE — Progress Notes (Signed)
Heart Failure Navigator Progress Note  Assessed for Heart & Vascular TOC clinic readiness.  Patient does not meet criteria due to history of present AS on ECHO.   Navigator available for reassessment of patient.   Pricilla Holm, MSN, RN Heart Failure Nurse Navigator [phone]

## 2021-01-25 NOTE — Progress Notes (Signed)
[place] for Coumadin Indication: atrial fibrillation  Allergies  Allergen Reactions   Other Rash    Allergen: "Plants and bushes while doing yard work"    Patient Measurements: Height: 5' 8.5" (174 cm) Weight: 91.5 kg (201 lb 11.2 oz) IBW/kg (Calculated) : 69.55  Vital Signs: Temp: 98.1 F (36.7 C) (09/06 0410) Temp Source: Axillary (09/06 0410) BP: 114/59 (09/06 0410) Pulse Rate: 83 (09/06 0410)  Labs: Recent Labs    01/24/21 0408 01/24/21 0453 01/24/21 0716 01/25/21 0224  HGB 12.6* 11.6*  --  12.0*  HCT 37.9* 34.0*  --  35.8*  PLT 191  --   --  173  LABPROT  --   --  21.0* 22.0*  INR  --   --  1.8* 1.9*  CREATININE 1.28* 1.30*  --  1.33*  TROPONINIHS 30*  --  30*  --      Estimated Creatinine Clearance: 54.9 mL/min (A) (by C-G formula based on SCr of 1.33 mg/dL (H)).  Assessment: 75yo male recently discharged after stay for severe aortic stenosis, s/p cath 9/1 and to continue TAVR workup outpatient, now c/o SOB that has kept him awake, admitted for acute pulmonary edema, to continue Coumadin for Afib; last dose of Coumadin taken 9/4 AM.  INR 1.9 today  PTA warfarin dose - '4mg'$  daily except '2mg'$  Tues/Fri  Goal of Therapy:  INR 2-3   Plan:  Repeat Warfarin '4mg'$  PO x 1 dose at 1600 Monitor daily INR, CBC, s/sx bleeding    Thank you Anette Guarneri, PharmD Please check AMION for all [place] contact numbers  01/25/2021 8:17 AM

## 2021-01-25 NOTE — Discharge Summary (Signed)
Physician Discharge Summary  Rodney Friedman [id number] DOB: 1946-03-06 DOA: 01/24/2021  PCP: Rodney Cowman, MD  Admit date: 01/24/2021 Discharge date: 01/25/2021  Admitted From: Home Disposition: Home  Recommendations for Outpatient Follow-up:  Follow up with PCP in 1-2 weeks Follow-up with cardiology, Rodney Friedman in 2 weeks Follow-up with structural heart team as scheduled for further work-up for TAVR Encourage compliance with his home diuretic regimen  Home Health: No Equipment/Devices: None  Discharge Condition: Stable CODE STATUS: DNR Diet recommendation: Heart healthy/consistent carbohydrate diet, low-salt  History of present illness:  Rodney Friedman is a 75 year old male with past medical history significant for aortic stenosis, CAD s/p CABG, paroxysmal atrial fibrillation, CLL in remission, type 2 diabetes mellitus, chronic diastolic congestive heart failure who presents to [facility] ED on 9/5 with progressive shortness of Friedman for the past 2 days.  Patient reports he may have missed some of his medications over the last week.  Patient was recently admitted for CHF exacerbation and underwent left heart catheterization on January 20, 2021; with recommendations to resume his home medications at that time.  Patient denies any chest pain, no productive cough, no fever/chills.   In the ED, temperature 9 7.4 F, HR 93, RR 24, BP 140/69, SPO2 94% on room air.  Sodium 141, potassium 4.1, chloride 111, CO2 21, BUN 21, creatinine 1.28, glucose 143.  BNP 672.5.  High sensitive troponin 30> 30, flat.  WBC 12.9, hemoglobin 12.6, platelets 191.  [name].  Influenza A/B PCR negative.Chest x-ray with cardiomegaly, hazy/interstitial coarsening consistent with pulmonary edema with small left pleural effusion.  Patient was started on furosemide 40 mg IV by EDP and consulted [facility] for further evaluation and management of acute on chronic diastolic congestive heart failure.  Hospital  course:  Chronic diastolic congestive heart failure, decompensated Patient presenting to ED with 2-day history of progressive shortness of Friedman.  Patient is afebrile with mild elevated WC count of 12.9.  Chest x-ray with vascular congestion and small left pleural effusion.  BNP elevated 672.5.  Etiology likely secondary to CHF exacerbation in the setting of missed diuretic doses at home. TTE 12/17/2020 with LVEF 60 to 65%, no LV regional wall motion abnormalities, indeterminate diastolic parameters, moderate aortic valve stenosis, IVC dilated.  Patient was started on furosemide 20 mg IV every 12 hours with improvement of his respiratory symptoms.  Patient will discharge back home on his normal dose of furosemide 20 mg p.o. daily.  Recommended maintain daily weights and bring to next PCP/cardiology visit.  Important to maintain compliance with diuretic regimen as prescribed.   Aortic stenosis Has progressed to severe calcific aortic stenosis, currently being worked up for TAVR, appointment 9/13. Outpatient follow-up with cardiology, Rodney Friedman   CAD s/p CABG Continue statin, not on aspirin   Paroxysmal atrial fibrillation Metoprolol succinate 50 mg p.o. twice daily. Continue Coumadin, pharmacy consulted for dosing/monitoring   Type 2 diabetes mellitus Hemoglobin A1c 6.1, 12/17/2020, well controlled.  On metformin 500 mg p.o. twice daily at home.   Dyslipidemia: Crestor 20 mg p.o. daily, Zetia 10 mg p.o. daily   Peripheral neuropathy: Gabapentin '100mg'$  p.o. daily, 200 mg p.o. nightly   Depression/anxiety: --Wellbutrin '200mg'$  PO qAM and '100mg'$  PO qPM   Chronic dyspnea Patient is followed by pulmonology outpatient, Rodney Friedman.  Recently seen in office 01/14/2021.  At risk for obstructive lung disease with previous history of extensive tobacco use. 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 Outpatient follow-up with urology  Discharge Diagnoses:  Principal Problem:   Acute CHF (congestive heart failure) (HCC) Active Problems:   CLL (chronic lymphocytic leukemia) (HCC)   Coronary artery disease of native artery of native heart with stable angina pectoris (HCC)   Chronic atrial fibrillation (HCC)   Aortic valve stenosis, nonrheumatic   Tremor, essential   Diabetes mellitus type 2 in obese [name]   Prostate cancer metastatic to intrapelvic lymph node [name]    Discharge Instructions  Discharge Instructions     (HEART FAILURE PATIENTS) Call MD:  Anytime you have any of the following symptoms: 1) 3 pound weight gain in 24 hours or 5 pounds in 1 week 2) shortness of Friedman, with or without a dry hacking cough 3) swelling in the hands, feet or stomach 4) if you have to sleep on extra pillows at night in order to breathe.   Complete by: As directed    Call MD for:  difficulty breathing, headache or visual disturbances   Complete by: As directed    Call MD for:  extreme fatigue   Complete by: As directed    Call MD for:  persistant dizziness or light-headedness   Complete by: As directed    Call MD for:  persistant nausea and vomiting   Complete by: As directed    Call MD for:  severe uncontrolled pain   Complete by: As directed    Call MD for:  temperature >100.4   Complete by: As directed    Diet - low sodium heart healthy   Complete by: As directed    Increase activity slowly   Complete by: As directed       Allergies as of 01/25/2021       Reactions   Other Rash   Allergen: "Plants and bushes while doing yard work"        Medication List     TAKE these medications    BIOTIN PO Take 1 tablet by mouth every evening.   buPROPion ER 100 MG 12 hr tablet Commonly known as: Wellbutrin SR 2  qam   1  q diner What changed:  how much to take how to take this when to take this additional instructions   CALCIUM 1200 PO Take 1,200 mg by mouth  every evening.   docusate sodium 100 MG capsule Commonly known as: COLACE Take 100 mg by mouth every evening.   ezetimibe 10 MG tablet Commonly known as: ZETIA Take 1 tablet (10 mg total) by mouth daily. What changed: when to take this   Fish Oil 1000 MG Caps Take 1,000 mg by mouth daily with supper.   furosemide 20 MG tablet Commonly known as: Lasix Take 1 tablet (20 mg total) by mouth daily.   gabapentin 100 MG capsule Commonly known as: NEURONTIN 3  qhs What changed:  how much to take how to take this when to take this additional instructions   Incruse Ellipta 62.5 MCG/INH Aepb Generic drug: umeclidinium bromide Inhale 1 puff into the lungs daily.   ketoconazole 2 % shampoo Commonly known as: NIZORAL Apply 1 application topically 2 (two) times a week. What changed: when to take this   latanoprost 0.005 % ophthalmic solution Commonly known as: XALATAN Place 1 drop into the left eye every other day.   LYCOPENE PO Take 10 mg by mouth every other day.   Magnesium 250 MG Tabs Take 250 mg by mouth  every 3 (three) days.   metFORMIN 500 MG tablet Commonly known as: GLUCOPHAGE Take 1 tablet (500 mg total) by mouth 2 (two) times daily with a meal.   metoprolol succinate 50 MG 24 hr tablet Commonly known as: TOPROL-XL Take 1 tablet (50 mg total) by mouth 2 (two) times daily. Take with or immediately following a meal.   PREVIDENT 5000 DRY MOUTH DT Place 1 application onto teeth See admin instructions. Apply to gums every night for dry mouth   rosuvastatin 20 MG tablet Commonly known as: Crestor Take 1 tablet (20 mg total) by mouth daily. What changed: when to take this   Tums Ultra 1000 400 MG chewable tablet Generic drug: calcium elemental as carbonate Chew 1,000 mg by mouth daily as needed for heartburn.   vitamin B-12 500 MCG tablet Commonly known as: CYANOCOBALAMIN Take 500 mcg by mouth every other day.   vitamin C 500 MG tablet Commonly known as:  ASCORBIC ACID Take 500-1,000 mg by mouth See admin instructions. Alternate taking 500 mg one day and 1000 mg the next   Vitamin D (Ergocalciferol) 1.25 MG (50000 UNIT) Caps capsule Commonly known as: DRISDOL Take 50,000 Units by mouth every Saturday.   VITAMIN-B COMPLEX PO Take 1 tablet by mouth daily with supper.   warfarin 4 MG tablet Commonly known as: COUMADIN Take as directed. If you are unsure how to take this medication, talk to your nurse or doctor. Original instructions: TAKE 1/2 TO 1 TABLET DAILY AS DIRECTED BY COUMADIN CLINIC What changed: See the new instructions.        Follow-up Information     Rodney Cowman, MD. Schedule an appointment as soon as possible for a visit in 1 week(s).   Specialty: Family Medicine Contact information: [place] [address] [phone]         Croitoru, Dani Gobble, MD. Schedule an appointment as soon as possible for a visit in 2 week(s).   Specialty: Cardiology Contact information: [address] [place] [address] [phone]                Allergies  Allergen Reactions   Other Rash    Allergen: "Plants and bushes while doing yard work"    Consultations: None   Procedures/Studies: CARDIAC CATHETERIZATION  Result Date: 01/20/2021   Prox Cx lesion is 99% stenosed.   Prox Cx to Mid Cx lesion is 100% stenosed.   Prox LAD lesion is 70% stenosed.   Mid LAD lesion is 100% stenosed.   Prox RCA lesion is 100% stenosed.   SVG graft was visualized by angiography and is large.   LIMA graft was visualized by angiography.   SVG graft was visualized by angiography and is large.   The graft exhibits no disease.   The graft exhibits no disease.   The graft exhibits no disease. Severe triple vessel CAD s/p 3V CABG with 3/3 patent bypass grafts 100% mid LAD occlusion. The mid and distal LAD fills from the patent LIMA graft. The Diagonal branch fills from the patent vein graft. 100% mid Circumflex occlusion.  The vein graft to the OM branch is patent The RCA has chronic proximal total occlusion. The distal vessel fills from left to right collaterals through the vein graft to OM. Severe aortic stenosis by echo (by cath the mean gradient is 32 mmhg, peak to peak gradient is 38 mmHg, AVA 1.0 cm2). Recommendations: Will continue medical management of CAD. Continue workup for TAVR.   CT  CORONARY MORPH W/CTA COR W/SCORE W/CA W/CM &/OR WO/CM  Addendum Date: 12/30/2020   ADDENDUM REPORT: 12/30/2020 12:52 CLINICAL DATA:  Aortic stenosis EXAM: Cardiac TAVR CT TECHNIQUE: The patient was scanned on a Siemens Force [id number] slice scanner. A 120 kV retrospective scan was triggered in the descending thoracic aorta at 111 HU's. Gantry rotation speed was 270 msecs and collimation was .9 mm. No beta blockade or nitro were given. The 3D data set was reconstructed in 5% intervals of the R-R cycle. Systolic and diastolic phases were analyzed on a dedicated work station using MPR, MIP and VRT modes. The patient received 80 cc of contrast. FINDINGS: Aortic Valve: Tri leaflet calcified with restricted leaflet motion calcium score 3420 Aorta: No aneurysm, severe calcific atherosclerosis normal arch vessels Sinotubular Junction: 29 mm Ascending Thoracic Aorta: 35 mm Aortic Arch: 31 mm Descending Thoracic Aorta: 27 mm Sinus of Valsalva Measurements: Non-coronary: 33.4 mm Right - coronary: 30.1 mm Left - coronary: 33.4 mm Coronary Artery Height above Annulus: Left Main: 14.3 mm above annulus Right Coronary: 18.9 mm above annulus Virtual Basal Annulus Measurements: Maximum/Minimum Diameter: 22.2 mm x 26.8 mm Perimeter: 78 mm Area: 451 mm 2 Coronary Arteries: Likely left dominant Patent SVG to distal OM, Patent SVG to Diagonal Patent LIMA to LAD Optimum Fluoroscopic Angle for Delivery: LAO 18 Caudal 6 degrees IMPRESSION: 1.  Tri leaflet AV calcified with score 3420 2. Optimum angiographic angle for deployment LAO 18 Caudal 6 degrees 3. Left dominant  native coronary arteries Patent SVG to distal OM and Diagonal Patent LIMA to LAD 4. Annular area of 451 mm2 suitable for a 26 mm Sapien 3 valve large bulky calcium noted at commissure between left and non cusps 5. Coronary arteries sufficient height above annulus for deployment with patent grafts 6. Normal aortic root 3.5 cm with severe calcific atherosclerosis and normal arch vessels 7.  Severe posterior annular mitral calcification Jenkins Rouge Electronically Signed   By: Jenkins Rouge M.D.   On: 12/30/2020 12:52   Result Date: 12/30/2020 EXAM: OVER-READ INTERPRETATION  CT CHEST The following report is an over-read performed by radiologist Dr. Vinnie Langton of [facility], [place] on 12/30/2020. This over-read does not include interpretation of cardiac or coronary anatomy or pathology. The coronary calcium score/coronary CTA interpretation by the cardiologist is attached. COMPARISON:  Chest CT 12/17/2020. FINDINGS: Extracardiac findings will be described separately under dictation for contemporaneously obtained CTA chest, abdomen and pelvis. IMPRESSION: Please see separate dictation for contemporaneously obtained CTA chest, abdomen and pelvis dated 12/30/2020 for full description of relevant extracardiac findings. Electronically Signed: By: Vinnie Langton M.D. On: 12/30/2020 12:01   DG Chest Portable 1 View  Result Date: 01/24/2021 CLINICAL DATA:  Shortness of Friedman EXAM: PORTABLE CHEST 1 VIEW COMPARISON:  12/17/2020 FINDINGS: Cardiomegaly with hazy chest and interstitial coarsening. Small left pleural effusion. Prior median sternotomy. No pneumothorax. IMPRESSION: CHF pattern including small left pleural effusion Electronically Signed   By: Monte Fantasia M.D.   On: 01/24/2021 04:23   CT ANGIO CHEST AORTA W/CM & OR WO/CM  Result Date: 12/30/2020 CLINICAL DATA:  75 year old male with history of severe aortic stenosis. Preprocedural study prior to potential transcatheter aortic valve replacement  (TAVR) procedure. EXAM: CT ANGIOGRAPHY CHEST, ABDOMEN AND PELVIS TECHNIQUE: Multidetector CT imaging through the chest, abdomen and pelvis was performed using the standard protocol during bolus administration of intravenous contrast. Multiplanar reconstructed images and MIPs were obtained and reviewed to evaluate the vascular anatomy. CONTRAST:  54m OMNIPAQUE IOHEXOL 350  MG/ML SOLN COMPARISON:  Chest CTA 12/17/2020. No prior CT the abdomen or pelvis. FINDINGS: CTA CHEST FINDINGS Cardiovascular: Heart size is enlarged with left atrial dilatation. There is no significant pericardial fluid, thickening or pericardial calcification. There is aortic atherosclerosis, as well as atherosclerosis of the great vessels of the mediastinum and the coronary arteries, including calcified atherosclerotic plaque in the left main, left anterior descending, left circumflex and right coronary arteries. Status post median sternotomy for CABG including LIMA to the LAD. Severe thickening and calcification of the aortic valve. Severe calcifications of the mitral annulus. Mediastinum/Lymph Nodes: No pathologically enlarged mediastinal or hilar lymph nodes. Esophagus is unremarkable in appearance. No axillary lymphadenopathy. Lungs/Pleura: Small bilateral pleural effusions lying dependently. No acute consolidative airspace disease. No suspicious appearing pulmonary nodules or masses are noted. Musculoskeletal/Soft Tissues: Median sternotomy wires. There are no aggressive appearing lytic or blastic lesions noted in the visualized portions of the skeleton. CTA ABDOMEN AND PELVIS FINDINGS Hepatobiliary: No suspicious cystic or solid hepatic lesions. No intra or extrahepatic biliary ductal dilatation. Gallbladder is normal in appearance. Pancreas: No pancreatic mass. No pancreatic ductal dilatation. No pancreatic or peripancreatic fluid collections or inflammatory changes. Spleen: Unremarkable. Adrenals/Urinary Tract: Several low-attenuation  lesions in both kidneys compatible with simple cysts measuring up to 1.6 cm in diameter in the lower pole of the right kidney and 1.8 cm in diameter in the lower pole the left kidney. Other subcentimeter low-attenuation lesions are also noted in both kidneys, too small to characterize, but statistically likely to represent tiny cysts. No hydroureteronephrosis. Urinary bladder is normal in appearance. Bilateral adrenal glands are normal in appearance. Stomach/Bowel: The appearance of the stomach is normal. No pathologic dilatation of small bowel or colon. Numerous colonic diverticulae are noted, without surrounding inflammatory changes to suggest an acute diverticulitis at this time. Normal appendix. Vascular/Lymphatic: Aortic atherosclerosis with vascular findings and measurements pertinent to potential TAVR procedure, as detailed below. 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 No lymphadenopathy noted in the abdomen or pelvis. Reproductive: Prostate gland and seminal vesicles are unremarkable in appearance. Other: No significant volume of ascites.  No pneumoperitoneum. Musculoskeletal: There are no aggressive appearing lytic or blastic lesions noted in the visualized portions of the skeleton. VASCULAR MEASUREMENTS PERTINENT TO TAVR: AORTA: Minimal Aortic Diameter-16 x 11 mm Severity of Aortic Calcification-severe RIGHT PELVIS: Right Common Iliac Artery - Minimal Diameter-7.7 x 6.0 mm Tortuosity-mild Calcification-severe Right External Iliac Artery - Minimal Diameter-6.1 x 4.3 mm Tortuosity - mild Calcification - severe Right Common Femoral Artery - Minimal Diameter-5.8 x 3.6 mm Tortuosity - mild Calcification - severe LEFT PELVIS: Left Common Iliac Artery - Minimal Diameter-7.4 x 6.7 mm Tortuosity - mild Calcification - severe Left External Iliac Artery - Minimal Diameter-5.3 x 4.3 mm Tortuosity - mild Calcification - severe Left Common Femoral  Artery - Minimal Diameter-5.7 x 4.5 mm Tortuosity - mild Calcification - severe Review of the MIP images confirms the above findings. IMPRESSION: 1. Vascular findings and measurements pertinent to potential TAVR procedure, as detailed above. 2. Severe thickening calcification of the aortic valve, compatible with reported clinical history of severe aortic stenosis. 3. Aortic atherosclerosis, in addition to left main and 3 vessel coronary artery disease. Status post median sternotomy for CABG including LIMA to the LAD. 4. Small bilateral pleural effusions lying dependently. 5. Colonic diverticulosis without evidence of acute diverticulitis at this time. 6. Additional incidental findings, as above. Electronically Signed   By:  Vinnie Langton M.D.   On: 12/30/2020 15:05   CT ANGIO ABDOMEN PELVIS  W &/OR WO CONTRAST  Result Date: 12/30/2020 CLINICAL DATA:  75 year old male with history of severe aortic stenosis. Preprocedural study prior to potential transcatheter aortic valve replacement (TAVR) procedure. EXAM: CT ANGIOGRAPHY CHEST, ABDOMEN AND PELVIS TECHNIQUE: Multidetector CT imaging through the chest, abdomen and pelvis was performed using the standard protocol during bolus administration of intravenous contrast. Multiplanar reconstructed images and MIPs were obtained and reviewed to evaluate the vascular anatomy. CONTRAST:  83m OMNIPAQUE IOHEXOL 350 MG/ML SOLN COMPARISON:  Chest CTA 12/17/2020. No prior CT the abdomen or pelvis. FINDINGS: CTA CHEST FINDINGS Cardiovascular: Heart size is enlarged with left atrial dilatation. There is no significant pericardial fluid, thickening or pericardial calcification. There is aortic atherosclerosis, as well as atherosclerosis of the great vessels of the mediastinum and the coronary arteries, including calcified atherosclerotic plaque in the left main, left anterior descending, left circumflex and right coronary arteries. Status post median sternotomy for CABG including  LIMA to the LAD. Severe thickening and calcification of the aortic valve. Severe calcifications of the mitral annulus. Mediastinum/Lymph Nodes: No pathologically enlarged mediastinal or hilar lymph nodes. Esophagus is unremarkable in appearance. No axillary lymphadenopathy. Lungs/Pleura: Small bilateral pleural effusions lying dependently. No acute consolidative airspace disease. No suspicious appearing pulmonary nodules or masses are noted. Musculoskeletal/Soft Tissues: Median sternotomy wires. There are no aggressive appearing lytic or blastic lesions noted in the visualized portions of the skeleton. CTA ABDOMEN AND PELVIS FINDINGS Hepatobiliary: No suspicious cystic or solid hepatic lesions. No intra or extrahepatic biliary ductal dilatation. Gallbladder is normal in appearance. Pancreas: No pancreatic mass. No pancreatic ductal dilatation. No pancreatic or peripancreatic fluid collections or inflammatory changes. Spleen: Unremarkable. Adrenals/Urinary Tract: Several low-attenuation lesions in both kidneys compatible with simple cysts measuring up to 1.6 cm in diameter in the lower pole of the right kidney and 1.8 cm in diameter in the lower pole the left kidney. Other subcentimeter low-attenuation lesions are also noted in both kidneys, too small to characterize, but statistically likely to represent tiny cysts. No hydroureteronephrosis. Urinary bladder is normal in appearance. Bilateral adrenal glands are normal in appearance. Stomach/Bowel: The appearance of the stomach is normal. No pathologic dilatation of small bowel or colon. Numerous colonic diverticulae are noted, without surrounding inflammatory changes to suggest an acute diverticulitis at this time. Normal appendix. Vascular/Lymphatic: Aortic atherosclerosis with vascular findings and measurements pertinent to potential TAVR procedure, as detailed below. 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 No lymphadenopathy noted in the abdomen or pelvis. Reproductive: Prostate gland and seminal vesicles are unremarkable in appearance. Other: No significant volume of ascites.  No pneumoperitoneum. Musculoskeletal: There are no aggressive appearing lytic or blastic lesions noted in the visualized portions of the skeleton. VASCULAR MEASUREMENTS PERTINENT TO TAVR: AORTA: Minimal Aortic Diameter-16 x 11 mm Severity of Aortic Calcification-severe RIGHT PELVIS: Right Common Iliac Artery - Minimal Diameter-7.7 x 6.0 mm Tortuosity-mild Calcification-severe Right External Iliac Artery - Minimal Diameter-6.1 x 4.3 mm Tortuosity - mild Calcification - severe Right Common Femoral Artery - Minimal Diameter-5.8 x 3.6 mm Tortuosity - mild Calcification - severe LEFT PELVIS: Left Common Iliac Artery - Minimal Diameter-7.4 x 6.7 mm Tortuosity - mild Calcification - severe Left External Iliac Artery - Minimal Diameter-5.3 x 4.3 mm Tortuosity - mild Calcification - severe Left Common Femoral Artery - Minimal Diameter-5.7 x 4.5 mm Tortuosity - mild Calcification -  severe Review of the MIP images confirms the above findings. IMPRESSION: 1. Vascular findings and measurements pertinent to potential TAVR procedure, as detailed above. 2. Severe thickening calcification of the aortic valve, compatible with reported clinical history of severe aortic stenosis. 3. Aortic atherosclerosis, in addition to left main and 3 vessel coronary artery disease. Status post median sternotomy for CABG including LIMA to the LAD. 4. Small bilateral pleural effusions lying dependently. 5. Colonic diverticulosis without evidence of acute diverticulitis at this time. 6. Additional incidental findings, as above. Electronically Signed   By: Vinnie Langton M.D.   On: 12/30/2020 15:05     Subjective: Patient seen examined at bedside, resting comfortably.  No complaints this morning.  Ready for discharge home.  States breathing is much improved.   Discussed importance of compliance with diuretics.  Patient states ready for discharge home.  No other questions or concerns at this time.  Denies headache, no fever/chills/night sweats, no nausea/vomiting/diarrhea, no chest pain, no palpitations, no shortness of Friedman, no abdominal pain, no weakness, no fatigue, no paresthesias.  No acute events overnight per nursing staff.  Discharge Exam: Vitals:   01/24/21 2340 01/25/21 0410  BP: 104/65 (!) 114/59  Pulse: 88 83  Resp: 20 18  Temp: 97.7 F (36.5 C) 98.1 F (36.7 C)  SpO2: 96% 95%   Vitals:   01/24/21 2046 01/24/21 2245 01/24/21 2340 01/25/21 0410  BP:   104/65 (!) 114/59  Pulse:   88 83  Resp:   20 18  Temp:   97.7 F (36.5 C) 98.1 F (36.7 C)  TempSrc:   Oral Axillary  SpO2: 100% 98% 96% 95%  Weight:    91.5 kg  Height:        General: Pt is alert, awake, not in acute distress Cardiovascular: RRR, S1/S2 +, no rubs, no gallops Respiratory: CTA bilaterally, no wheezing, no rhonchi Abdominal: Soft, NT, ND, bowel sounds + Extremities: no edema, no cyanosis    The results of significant diagnostics from this hospitalization (including imaging, microbiology, ancillary and laboratory) are listed below for reference.     Microbiology: Recent Results (from the past 240 hour(s))  Resp Panel by RT-PCR (Flu A&B, Covid) Nasopharyngeal Swab     Status: None   Collection Time: 01/24/21  4:09 AM   Specimen: Nasopharyngeal Swab; Nasopharyngeal(NP) swabs in vial transport medium  Result Value Ref Range Status   SARS Coronavirus 2 by RT PCR NEGATIVE NEGATIVE Final    Comment: (NOTE) SARS-CoV-2 target nucleic acids are NOT DETECTED.  The SARS-CoV-2 RNA is generally detectable in upper respiratory specimens during the acute phase of infection. The lowest concentration of SARS-CoV-2 viral copies this assay can detect is 138 copies/mL. A negative result does not preclude SARS-Cov-2 infection and should not be used as the sole basis  for treatment or other patient management decisions. A negative result may occur with  improper specimen collection/handling, submission of specimen other than nasopharyngeal swab, presence of viral mutation(s) within the areas targeted by this assay, and inadequate number of viral copies(<138 copies/mL). A negative result must be combined with clinical observations, patient history, and epidemiological information. The expected result is Negative.  Fact Sheet for Patients:  EntrepreneurPulse.com.au  Fact Sheet for Healthcare Providers:  IncredibleEmployment.be  This test is no t yet approved or cleared by the [place] FDA and  has been authorized for detection and/or diagnosis of SARS-CoV-2 by FDA under an Emergency Use Authorization (EUA). This EUA will remain  in effect (meaning  this test can be used) for the duration of the COVID-19 declaration under Section 564(b)(1) of the Act, 21 U.S.C.section 360bbb-3(b)(1), unless the authorization is terminated  or revoked sooner.       Influenza A by PCR NEGATIVE NEGATIVE Final   Influenza B by PCR NEGATIVE NEGATIVE Final    Comment: (NOTE) The Xpert Xpress SARS-CoV-2/FLU/RSV plus assay is intended as an aid in the diagnosis of influenza from Nasopharyngeal swab specimens and should not be used as a sole basis for treatment. Nasal washings and aspirates are unacceptable for Xpert Xpress SARS-CoV-2/FLU/RSV testing.  Fact Sheet for Patients: EntrepreneurPulse.com.au  Fact Sheet for Healthcare Providers: IncredibleEmployment.be  This test is not yet approved or cleared by the [place] FDA and has been authorized for detection and/or diagnosis of SARS-CoV-2 by FDA under an Emergency Use Authorization (EUA). This EUA will remain in effect (meaning this test can be used) for the duration of the COVID-19 declaration under Section 564(b)(1) of the Act, 21  U.S.C. section 360bbb-3(b)(1), unless the authorization is terminated or revoked.  Performed at [facility], [place] [address], [place], [place] [address]      Labs: BNP (last 3 results) Recent Labs    12/17/20 1140 01/24/21 0409  BNP 362.3* 0000000*   Basic Metabolic Panel: Recent Labs  Lab 01/20/21 0907 01/20/21 0908 01/24/21 0408 01/24/21 0453 01/25/21 0224  NA 146* 145 141 145 141  K 3.8 4.0 4.1 4.0 3.6  CL  --   --  111 110 110  CO2  --   --  21*  --  25  GLUCOSE  --   --  143* 135* 117*  BUN  --   --  21 19 24*  CREATININE  --   --  1.28* 1.30* 1.33*  CALCIUM  --   --  9.5  --  9.3   Liver Function Tests: Recent Labs  Lab 01/24/21 0408  AST 30  ALT 31  ALKPHOS 32*  BILITOT 0.8  PROT 5.9*  ALBUMIN 3.4*   No results for input(s): LIPASE, AMYLASE in the last 168 hours. No results for input(s): AMMONIA in the last 168 hours. CBC: Recent Labs  Lab 01/20/21 0907 01/20/21 0908 01/24/21 0408 01/24/21 0453 01/25/21 0224  WBC  --   --  12.9*  --  11.7*  NEUTROABS  --   --  7.9*  --   --   HGB 10.5* 11.2* 12.6* 11.6* 12.0*  HCT 31.0* 33.0* 37.9* 34.0* 35.8*  MCV  --   --  91.1  --  90.2  PLT  --   --  191  --  173   Cardiac Enzymes: No results for input(s): CKTOTAL, CKMB, CKMBINDEX, TROPONINI in the last 168 hours. BNP: Invalid input(s): POCBNP CBG: Recent Labs  Lab 01/24/21 0737 01/24/21 1123 01/24/21 1626 01/24/21 2114 01/25/21 0616  GLUCAP 126* 125* 103* 113* 137*   D-Dimer No results for input(s): DDIMER in the last 72 hours. Hgb A1c No results for input(s): HGBA1C in the last 72 hours. Lipid Profile No results for input(s): CHOL, HDL, LDLCALC, TRIG, CHOLHDL, LDLDIRECT in the last 72 hours. Thyroid function studies No results for input(s): TSH, T4TOTAL, T3FREE, THYROIDAB in the last 72 hours.  Invalid input(s): FREET3 Anemia work up No results for input(s): VITAMINB12, FOLATE, FERRITIN, TIBC, IRON, RETICCTPCT in the last 72  hours. Urinalysis    Component Value Date/Time   APPEARANCEUR Clear 02/09/2017 1238   GLUCOSEU 3+ (A) 02/09/2017 1238  BILIRUBINUR Negative 02/09/2017 1238   PROTEINUR Negative 02/09/2017 1238   UROBILINOGEN 0.2 06/04/2014 1539   NITRITE Negative 02/09/2017 1238   LEUKOCYTESUR Negative 02/09/2017 1238   Sepsis Labs Invalid input(s): PROCALCITONIN,  WBC,  LACTICIDVEN Microbiology Recent Results (from the past 240 hour(s))  Resp Panel by RT-PCR (Flu A&B, Covid) Nasopharyngeal Swab     Status: None   Collection Time: 01/24/21  4:09 AM   Specimen: Nasopharyngeal Swab; Nasopharyngeal(NP) swabs in vial transport medium  Result Value Ref Range Status   SARS Coronavirus 2 by RT PCR NEGATIVE NEGATIVE Final    Comment: (NOTE) SARS-CoV-2 target nucleic acids are NOT DETECTED.  The SARS-CoV-2 RNA is generally detectable in upper respiratory specimens during the acute phase of infection. The lowest concentration of SARS-CoV-2 viral copies this assay can detect is 138 copies/mL. A negative result does not preclude SARS-Cov-2 infection and should not be used as the sole basis for treatment or other patient management decisions. A negative result may occur with  improper specimen collection/handling, submission of specimen other than nasopharyngeal swab, presence of viral mutation(s) within the areas targeted by this assay, and inadequate number of viral copies(<138 copies/mL). A negative result must be combined with clinical observations, patient history, and epidemiological information. The expected result is Negative.  Fact Sheet for Patients:  EntrepreneurPulse.com.au  Fact Sheet for Healthcare Providers:  IncredibleEmployment.be  This test is no t yet approved or cleared by the [place] FDA and  has been authorized for detection and/or diagnosis of SARS-CoV-2 by FDA under an Emergency Use Authorization (EUA). This EUA will remain  in effect  (meaning this test can be used) for the duration of the COVID-19 declaration under Section 564(b)(1) of the Act, 21 U.S.C.section 360bbb-3(b)(1), unless the authorization is terminated  or revoked sooner.       Influenza A by PCR NEGATIVE NEGATIVE Final   Influenza B by PCR NEGATIVE NEGATIVE Final    Comment: (NOTE) The Xpert Xpress SARS-CoV-2/FLU/RSV plus assay is intended as an aid in the diagnosis of influenza from Nasopharyngeal swab specimens and should not be used as a sole basis for treatment. Nasal washings and aspirates are unacceptable for Xpert Xpress SARS-CoV-2/FLU/RSV testing.  Fact Sheet for Patients: EntrepreneurPulse.com.au  Fact Sheet for Healthcare Providers: IncredibleEmployment.be  This test is not yet approved or cleared by the [place] FDA and has been authorized for detection and/or diagnosis of SARS-CoV-2 by FDA under an Emergency Use Authorization (EUA). This EUA will remain in effect (meaning this test can be used) for the duration of the COVID-19 declaration under Section 564(b)(1) of the Act, 21 U.S.C. section 360bbb-3(b)(1), unless the authorization is terminated or revoked.  Performed at [facility], [place] [address], [place], [place] [address]      Time coordinating discharge: Over 30 minutes  SIGNED:   Cardin Nitschke J [place] DO  [facility] 01/25/2021, 10:18 AM

## 2021-01-27 ENCOUNTER — Encounter: Payer: Self-pay | Admitting: Hematology and Oncology

## 2021-01-28 ENCOUNTER — Ambulatory Visit (INDEPENDENT_AMBULATORY_CARE_PROVIDER_SITE_OTHER): Payer: Medicare Other | Admitting: *Deleted

## 2021-01-28 ENCOUNTER — Other Ambulatory Visit: Payer: Self-pay

## 2021-01-28 DIAGNOSIS — I482 Chronic atrial fibrillation, unspecified: Secondary | ICD-10-CM | POA: Diagnosis not present

## 2021-01-28 DIAGNOSIS — Z5181 Encounter for therapeutic drug level monitoring: Secondary | ICD-10-CM

## 2021-01-28 DIAGNOSIS — Z7901 Long term (current) use of anticoagulants: Secondary | ICD-10-CM | POA: Diagnosis not present

## 2021-01-28 LAB — POCT INR: INR: 3.5 — AB (ref 2.0–3.0)

## 2021-01-28 NOTE — Patient Instructions (Addendum)
Description   Hold warfarin tomorrow and then continue to take warfarin 1 tablet daily except for 1/2 a tablet on Tuesdays and Fridays. Recheck INR in 2 weeks. [facility] [phone]

## 2021-02-01 ENCOUNTER — Other Ambulatory Visit: Payer: Self-pay

## 2021-02-01 ENCOUNTER — Ambulatory Visit: Payer: Medicare Other | Attending: Cardiovascular Disease

## 2021-02-01 ENCOUNTER — Institutional Professional Consult (permissible substitution) (INDEPENDENT_AMBULATORY_CARE_PROVIDER_SITE_OTHER): Payer: Medicare Other | Admitting: Surgery

## 2021-02-01 VITALS — BP 139/80 | HR 104 | Resp 20 | Ht 68.0 in | Wt 201.0 lb

## 2021-02-01 DIAGNOSIS — I2510 Atherosclerotic heart disease of native coronary artery without angina pectoris: Secondary | ICD-10-CM

## 2021-02-01 DIAGNOSIS — R262 Difficulty in walking, not elsewhere classified: Secondary | ICD-10-CM | POA: Insufficient documentation

## 2021-02-01 DIAGNOSIS — I350 Nonrheumatic aortic (valve) stenosis: Secondary | ICD-10-CM | POA: Diagnosis not present

## 2021-02-01 NOTE — Progress Notes (Addendum)
Patient ID: Rodney Friedman, male   DOB: 11/12/1945, 75 y.o.   MRN: [id number]  [facility]VE CLINIC        [address]       [place] [address]             [phone]          CARDIOTHORACIC SURGERY CONSULTATION REPORT  PCP is Kristie Cowman, MD Referring Provider is Lauree Chandler, MD Primary Cardiologist is Sanda Klein, MD  Reason for consultation:  Severe aortic stenosis  HPI:  The patient is a 75 year old gentleman with a history of coronary artery disease status post CABG x3 in 2006, paroxysmal atrial fibrillation, hypertension, hyperlipidemia, type 2 diabetes, former recent smoker until July 2022, OSA, CLL, prostate cancer, and aortic stenosis.  He has had moderate aortic stenosis by previous echocardiogram and has been followed by Dr. Sallyanne Kuster.  He was hospitalized with pneumonia in July 2022 which may have been congestive heart failure and since then has had increasing shortness of breath on exertion.  Echocardiogram on 12/17/2020 showed a thickened and calcified aortic valve with reduced leaflet mobility.  The mean gradient had increased to 33.5 mmHg with a peak gradient of 57.5 mmHg.  Aortic valve area was 0.59 cm with a dimensionless index of 0.16.  Stroke-volume index was 27.  Left ventricular ejection fraction was 60 to 65%.  His gated cardiac CT showed a calcium score of 3000 consistent with severe aortic stenosis.  He has had 3 admissions this summer with congestive heart failure symptoms.  He reports progressively worsening exertional shortness of breath, chest pressure, and orthopnea.  He said that at night now he frequently has to sit up all night in a chair.  He has shortness of breath with walking around his house.  He denies any dizziness or syncope.  He has had no peripheral edema.  He is divorced and lives alone in [place].  He is here today with a friend.  He is a retired high school history  Pharmacist, hospital.  He sees a dentist regularly and takes good care of his teeth.  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/2013   Depression    Diabetes mellitus without complication (HCC)    Diverticulosis    Eye abnormality    Macular scarring R eye   Glaucoma    Heart murmur    HLD (hyperlipidemia)    Hypertension    Leukemia ([place])    CLL   Myocardial infarction ([facility]) 2006   Prostate cancer ([place]) 07/08/2013   [facility] at [facility]- getting Lupron shot every 6 months - traces in prostate and 2 lymphnodes left hip- non focused traces per pt    Sleep apnea    Substance abuse ([place])    Tremor, essential 09/22/2015    Past Surgical History:  Procedure Laterality Date   Arm Surgery Right    from door accident with glass   CATARACT EXTRACTION W/ INTRAOCULAR LENS IMPLANT Bilateral    COLONOSCOPY     CORONARY ARTERY BYPASS GRAFT  08/04/2004   POLYPECTOMY     RIGHT/LEFT HEART CATH AND CORONARY/GRAFT ANGIOGRAPHY N/A 01/20/2021   Procedure: RIGHT/LEFT HEART CATH AND CORONARY/GRAFT ANGIOGRAPHY;  Surgeon: Burnell Blanks, MD;  Location: [facility];  Service: Cardiovascular;  Laterality: N/A;    Family History  Problem Relation Age of Onset   Colon cancer Mother    Ovarian cancer Mother    Uterine cancer Mother    Hypertension Father    Prostate cancer Father    Colon polyps Neg Hx     Social History   Socioeconomic History   Marital status: Divorced    Spouse name: Not on file   Number of children: 2   Years of education: 42   Highest education level: Not on file  Occupational History   Occupation: Retired Pharmacist, hospital  Tobacco Use   Smoking status: Former    Packs/day: 0.50    Years: 50.00    Pack years: 25.00    Types: Cigarettes    Quit date: 12/16/2020    Years  since quitting: 0.1   Smokeless tobacco: Never  Vaping Use   Vaping Use: Never used  Substance and Sexual Activity   Alcohol use: No    Alcohol/week: 0.0 standard drinks   Drug use: No   Sexual activity: Not Currently  Other Topics Concern   Not on file  Social History Narrative   Lives at home w/ his father   Divorced   Right-handed   Education: Secretary/administrator   No caffeine   Social Determinants of Radio broadcast assistant Strain: Not on file  Food Insecurity: Not on file  Transportation Needs: Not on file  Physical Activity: Not on file  Stress: Not on file  Social Connections: Not on file  Intimate Partner Violence: Not on file    Prior to Admission medications   Medication Sig Start Date End Date Taking? Authorizing Provider  B Complex Vitamins (VITAMIN-B COMPLEX PO) Take 1 tablet by mouth daily with supper.    [provider]  BIOTIN PO Take 1 tablet by mouth every evening.    [provider]  buPROPion ER (WELLBUTRIN SR) 100 MG 12 hr tablet 2  qam   1  q diner Patient taking differently: Take 100-200 mg by mouth See admin instructions. 200 mg in the morning 100 mg in the evening 12/15/20   Norma Fredrickson, MD  Calcium Carbonate-Vit D-Min (CALCIUM 1200 PO) Take 1,200 mg by mouth every evening.    [provider]  calcium elemental as carbonate (TUMS ULTRA 1000) 400 MG chewable tablet Chew 1,000 mg by mouth daily as needed for heartburn.    [provider]  docusate sodium (COLACE) 100 MG capsule Take 100 mg by mouth every evening.    [provider]  ezetimibe (ZETIA) 10 MG tablet Take 1 tablet (10 mg total) by mouth daily. Patient taking differently: Take 10 mg by mouth at bedtime. 03/31/20   Croitoru, Mihai, MD  furosemide (LASIX) 20 MG tablet Take 1 tablet (20 mg total) by mouth daily. 01/17/21   Croitoru, Mihai, MD  gabapentin (NEURONTIN) 100 MG capsule 3  qhs Patient taking differently: Take 100-200 mg by mouth See admin  instructions. Take 100 mg in the morning and 200 mg at night 12/15/20   Plovsky, Berneta Sages, MD  ketoconazole (NIZORAL) 2 % shampoo Apply 1 application topically 2 (two) times a week. Patient taking differently: Apply 1 application topically every Monday, Wednesday, and Friday. 04/26/20   Wendie Agreste, MD  latanoprost (XALATAN) 0.005 % ophthalmic solution Place 1 drop into the left eye every other day. 06/30/13   [provider]  LYCOPENE PO Take 10 mg by mouth every other day.    [provider]  Magnesium 250 MG TABS Take 250 mg by mouth every 3 (three) days.    [provider]  metFORMIN (GLUCOPHAGE) 500 MG tablet Take 1 tablet (500 mg total) by mouth 2 (two) times daily with a meal. 04/26/20   Wendie Agreste, MD  metoprolol (TOPROL-XL) 50 MG 24 hr tablet Take 1 tablet (50 mg total) by mouth 2 (two) times daily. Take with or immediately following a meal. 12/20/20   Dessa Phi, DO  Omega-3 Fatty Acids (FISH OIL) 1000 MG CAPS Take 1,000 mg by mouth daily with supper.    [provider]  rosuvastatin (CRESTOR) 20 MG tablet Take 1 tablet (20 mg total) by mouth daily. Patient taking differently: Take 20 mg by mouth at bedtime. 03/31/20   Croitoru, Mihai, MD  Sodium Fluoride (PREVIDENT 5000 DRY MOUTH DT) Place 1 application onto teeth See admin instructions. Apply to gums every night for dry mouth    [provider]  umeclidinium bromide (INCRUSE ELLIPTA) 62.5 MCG/INH AEPB Inhale 1 puff into the lungs daily. 01/14/21   Freddi Starr, MD  vitamin B-12 (CYANOCOBALAMIN) 500 MCG tablet Take 500 mcg by mouth every other day.    [provider]  vitamin C (ASCORBIC ACID) 500 MG tablet Take 500-1,000 mg by mouth See admin instructions. Alternate taking 500 mg one day and 1000 mg the next    [provider]  Vitamin D, Ergocalciferol, (DRISDOL) 1.25 MG (50000 UNIT) CAPS capsule Take 50,000 Units by mouth every Saturday.    [provider]  warfarin (COUMADIN) 4 MG tablet TAKE 1/2 TO 1 TABLET DAILY AS DIRECTED BY COUMADIN CLINIC Patient taking differently: Take 2-4 mg by mouth See admin instructions. 2 mg on Tuesday and Friday 4 mg on Sunday,Monday,Wednesday,Thursday and saturday 07/05/20   Croitoru, Mihai, MD    Current Outpatient Medications  Medication Sig Dispense Refill   B Complex Vitamins (VITAMIN-B COMPLEX PO) Take 1 tablet by mouth daily with supper.     BIOTIN PO Take 1 tablet by mouth every evening.     buPROPion ER (WELLBUTRIN SR) 100 MG 12 hr tablet 2  qam   1  q diner (Patient taking differently: Take 100-200 mg by mouth See admin instructions. 200 mg in the morning 100 mg in the evening) 270 tablet 1   Calcium Carbonate-Vit D-Min (CALCIUM 1200 PO) Take 1,200 mg by mouth every evening.     calcium elemental as carbonate (TUMS ULTRA 1000) 400 MG chewable tablet Chew 1,000 mg by mouth daily as needed for heartburn.     docusate sodium (COLACE) 100 MG capsule Take 100 mg by mouth every evening.     ezetimibe (ZETIA) 10 MG tablet Take 1 tablet (10 mg total) by mouth daily. (Patient taking differently: Take 10 mg by mouth at bedtime.) 90 tablet 3   furosemide (LASIX) 20 MG tablet Take 1 tablet (20 mg total) by mouth daily. 90 tablet 3   gabapentin (NEURONTIN) 100 MG capsule 3  qhs (Patient taking differently: Take 100-200 mg by mouth See admin instructions. Take 100 mg in the morning and 200 mg at night) 270 capsule 1   ketoconazole (NIZORAL) 2 % shampoo Apply 1 application topically 2 (two) times a week. (Patient taking differently: Apply 1 application topically every Monday, Wednesday, and Friday.) 120 mL 3   latanoprost (XALATAN) 0.005 % ophthalmic solution Place 1 drop into the  left eye every other day.     LYCOPENE PO Take 10 mg by mouth every other day.     Magnesium 250 MG TABS Take 250 mg by mouth every 3 (three) days.     metFORMIN (GLUCOPHAGE) 500 MG tablet Take 1 tablet (500 mg total) by mouth 2 (two) times  daily with a meal. 180 tablet 2   metoprolol (TOPROL-XL) 50 MG 24 hr tablet Take 1 tablet (50 mg total) by mouth 2 (two) times daily. Take with or immediately following a meal. 30 tablet 0   Omega-3 Fatty Acids (FISH OIL) 1000 MG CAPS Take 1,000 mg by mouth daily with supper.     rosuvastatin (CRESTOR) 20 MG tablet Take 1 tablet (20 mg total) by mouth daily. (Patient taking differently: Take 20 mg by mouth at bedtime.) 90 tablet 3   Sodium Fluoride (PREVIDENT 5000 DRY MOUTH DT) Place 1 application onto teeth See admin instructions. Apply to gums every night for dry mouth     umeclidinium bromide (INCRUSE ELLIPTA) 62.5 MCG/INH AEPB Inhale 1 puff into the lungs daily. 30 each 6   vitamin B-12 (CYANOCOBALAMIN) 500 MCG tablet Take 500 mcg by mouth every other day.     vitamin C (ASCORBIC ACID) 500 MG tablet Take 500-1,000 mg by mouth See admin instructions. Alternate taking 500 mg one day and 1000 mg the next     Vitamin D, Ergocalciferol, (DRISDOL) 1.25 MG (50000 UNIT) CAPS capsule Take 50,000 Units by mouth every Saturday.     warfarin (COUMADIN) 4 MG tablet TAKE 1/2 TO 1 TABLET DAILY AS DIRECTED BY COUMADIN CLINIC (Patient taking differently: Take 2-4 mg by mouth See admin instructions. 2 mg on Tuesday and Friday 4 mg on Sunday,Monday,Wednesday,Thursday and saturday) 90 tablet 1   Current Facility-Administered Medications  Medication Dose Route Frequency Provider Last Rate Last Admin   0.9 %  sodium chloride infusion  500 mL Intravenous Once Danis, Kirke Corin, MD        Allergies  Allergen Reactions   Other Rash    Allergen: "Plants and bushes while doing yard work"      Review of Systems:   General:  + decreased appetite, + decreased energy, no weight gain, + weight loss, no fever  Cardiac:  + chest pain with exertion, + chest pain at rest, +SOB with minima exertion, + resting SOB, no PND, + orthopnea, no palpitations, + arrhythmia, + atrial fibrillation, NO LE edema, NO dizzy spells, NO  syncope  Respiratory:  + shortness of breath, NO home oxygen, NO productive cough, NO dry cough, NO bronchitis, NO wheezing, NO hemoptysis, NO asthma, + pain with inspiration or cough, NO sleep apnea, NO CPAP at night  GI:   NO difficulty swallowing, NO reflux, NO frequent heartburn, NO hiatal hernia, NO abdominal pain, NO constipation, NO diarrhea, NO hematochezia, NO hematemesis, NO melena  GU:   NO dysuria,  + frequency, NO urinary tract infection, NO hematuria, NO enlarged prostate, NO kidney stones, NO kidney disease  Vascular:  NO pain suggestive of claudication, NO pain in feet, NO leg cramps, NO varicose veins, NO DVT, NO non-healing foot ulcer  Neuro:   NO stroke, NO TIA's, NO seizures, NO headaches, NO temporary blindness one eye,  NO slurred speech, NO peripheral neuropathy, NO chronic pain, NO instability of gait, NO memory/cognitive dysfunction  Musculoskeletal: NO arthritis - primarily involving the NO, NO joint swelling, NO myalgias, NO difficulty walking, normal mobility   Skin:   NO  rash, NO itching, NO skin infections, NO pressure sores or ulcerations  Psych:   + anxiety, + depression, NO nervousness, NO unusual recent stress  Eyes:   NO blurry vision, NO floaters, NO recent vision changes, NO  glasses or contacts  ENT:   NO hearing loss, NO loose or painful teeth, NO dentures, last saw dentist 09/2020  Hematologic:  NO easy bruising, NO abnormal bleeding, NO clotting disorder, NO frequent epistaxis  Endocrine:  + diabetes, does not check CBG's at home     Physical Exam:   BP 139/80   Pulse (!) 104   Resp 20   Ht '5\' 8"'$  (1.727 m)   Wt 201 lb (91.2 kg)   SpO2 97% Comment: RA  BMI 30.56 kg/m   General:             Somewhat frail but well-appearing  HEENT:  Unremarkable, NCAT, PERLA, EOMI  Neck:   no JVD, no bruits, no adenopathy   Chest:   clear to auscultation, symmetrical breath sounds, no wheezes, no rhonchi   CV:   RRR, no 3/6 systolic murmur RSB. No diastolic  murmur  Abdomen:  soft, non-tender, no masses   Extremities:  warm, well-perfused, pulses palpable at ankle, no lower extremity edema, chronic venous stasis changes of lower legs.  Rectal/GU  Deferred  Neuro:   Grossly non-focal and symmetrical throughout  Skin:   Clean and dry, no rashes, no breakdown  Diagnostic Tests:  ECHOCARDIOGRAM REPORT         Patient Name:   GRAYER SZAFRAN Date of Exam: 12/17/2020  Medical Rec #:  [id number]       Height:       68.0 in  Accession #:    [id number]      Weight:       216.0 lb  Date of Birth:  17-Nov-1945       BSA:          2.112 m  Patient Age:    31 years        BP:           130/47 mmHg  Patient Gender: M               HR:           77 bpm.  Exam Location:  Inpatient   Procedure: 2D Echo, Cardiac Doppler and Color Doppler                       STAT ECHO  Reported to: Dr. Oval Linsey on 12/17/2020 3:31:00 AM.   Indications:    Dyspnea R06.00     History:        Patient has prior history of Echocardiogram examinations,  most                  recent 05/03/2020. CAD and Previous Myocardial Infarction,                  Arrythmias:Atrial Fibrillation; Risk Factors:ETOH,  Diabetes,                  Dyslipidemia and Hypertension.     Sonographer:    Bernadene Person RDCS  Referring Phys: [place]      Sonographer Comments: Technically difficult study due to poor echo  windows. Image acquisition challenging due to patient body habitus. Pt  unable to turn on left side.  IMPRESSIONS     1. Left ventricular  ejection fraction, by estimation, is 60 to 65%. The  left ventricle has normal function. The left ventricle has no regional  wall motion abnormalities. Left ventricular diastolic parameters are  indeterminate.   2. Right ventricular systolic function is normal. The right ventricular  size is normal. There is normal pulmonary artery systolic pressure.   3. Left atrial size was severely dilated.   4. Predominantly posterior  mitral annular calcification. The mitral valve  is normal in structure. Trivial mitral valve regurgitation. No evidence of  mitral stenosis. Moderate mitral annular calcification.   5. Compared with the echo 04/2020, mean aortic valve gradient has  increased from 25 mmHg to 33 mmHg. The aortic valve is normal in  structure. There is moderate calcification of the aortic valve. There is  moderate thickening of the aortic valve. Aortic  valve regurgitation is mild. Moderate aortic valve stenosis. Aortic  regurgitation PHT measures 428 msec. Aortic valve mean gradient measures  33.5 mmHg. Aortic valve Vmax measures 3.79 m/s.   6. Aortic dilatation noted. There is mild dilatation of the ascending  aorta, measuring 37 mm.   7. The inferior vena cava is dilated in size with >50% respiratory  variability, suggesting right atrial pressure of 8 mmHg.   FINDINGS   Left Ventricle: Left ventricular ejection fraction, by estimation, is 60  to 65%. The left ventricle has normal function. The left ventricle has no  regional wall motion abnormalities. The left ventricular internal cavity  size was normal in size. There is   no left ventricular hypertrophy. Left ventricular diastolic parameters  are indeterminate.   Right Ventricle: The right ventricular size is normal. No increase in  right ventricular wall thickness. Right ventricular systolic function is  normal. There is normal pulmonary artery systolic pressure. The tricuspid  regurgitant velocity is 2.39 m/s, and   with an assumed right atrial pressure of 8 mmHg, the estimated right  ventricular systolic pressure is [id number] mmHg.   Left Atrium: Left atrial size was severely dilated.   Right Atrium: Right atrial size was normal in size.   Pericardium: There is no evidence of pericardial effusion.   Mitral Valve: Predominantly posterior mitral annular calcification. The  mitral valve is normal in structure. Moderate mitral annular   calcification. Trivial mitral valve regurgitation. No evidence of mitral  valve stenosis.   Tricuspid Valve: The tricuspid valve is normal in structure. Tricuspid  valve regurgitation is trivial. No evidence of tricuspid stenosis.   Aortic Valve: Compared with the echo 04/2020, mean aortic valve gradient  has increased from 25 mmHg to 33 mmHg. The aortic valve is normal in  structure. There is moderate calcification of the aortic valve. There is  moderate thickening of the aortic  valve. Aortic valve regurgitation is mild. Aortic regurgitation PHT  measures 428 msec. Moderate aortic stenosis is present. Aortic valve mean  gradient measures 33.5 mmHg. Aortic valve peak gradient measures 57.5  mmHg. Aortic valve area, by VTI measures  0.59 cm.   Pulmonic Valve: The pulmonic valve was normal in structure. Pulmonic valve  regurgitation is not visualized. No evidence of pulmonic stenosis.   Aorta: Aortic dilatation noted. There is mild dilatation of the ascending  aorta, measuring 37 mm.   Venous: The inferior vena cava is dilated in size with greater than 50%  respiratory variability, suggesting right atrial pressure of 8 mmHg.   IAS/Shunts: No atrial level shunt detected by color flow Doppler.      LEFT VENTRICLE  PLAX 2D  LVIDd:         4.30 cm  LVIDs:         2.40 cm  LV PW:         1.40 cm  LV IVS:        1.40 cm  LVOT diam:     2.20 cm  LV SV:         58  LV SV Index:   27  LVOT Area:     3.80 cm      RIGHT VENTRICLE  TAPSE (M-mode): 1.5 cm   LEFT ATRIUM             Index       RIGHT ATRIUM           Index  LA diam:        5.30 cm 2.51 cm/m  RA Area:     19.40 cm  LA Vol (A2C):   89.0 ml 42.15 ml/m RA Volume:   51.20 ml  24.25 ml/m  LA Vol (A4C):   80.3 ml 38.03 ml/m  LA Biplane Vol: 93.9 ml 44.47 ml/m   AORTIC VALVE  AV Area (Vmax):    0.67 cm  AV Area (Vmean):   0.71 cm  AV Area (VTI):     0.59 cm  AV Vmax:           379.00 cm/s  AV Vmean:           263.500 cm/s  AV VTI:            0.979 m  AV Peak Grad:      57.5 mmHg  AV Mean Grad:      33.5 mmHg  LVOT Vmax:         66.75 cm/s  LVOT Vmean:        49.100 cm/s  LVOT VTI:          0.152 m  LVOT/AV VTI ratio: 0.16  AI PHT:            428 msec     AORTA  Ao Root diam: 3.60 cm  Ao Asc diam:  3.70 cm   TRICUSPID VALVE  TR Peak grad:   22.8 mmHg  TR Vmax:        239.00 cm/s     SHUNTS  Systemic VTI:  0.15 m  Systemic Diam: 2.20 cm   Skeet Latch MD  Electronically signed by Skeet Latch MD  Signature Date/Time: 12/17/2020/3:48:13 PM         Final     Physicians  Panel Physicians Referring Physician Case Authorizing Physician  Burnell Blanks, MD (Primary)     Procedures  RIGHT/LEFT HEART CATH AND CORONARY/GRAFT ANGIOGRAPHY   Conclusion      Prox Cx lesion is 99% stenosed.   Prox Cx to Mid Cx lesion is 100% stenosed.   Prox LAD lesion is 70% stenosed.   Mid LAD lesion is 100% stenosed.   Prox RCA lesion is 100% stenosed.   SVG graft was visualized by angiography and is large.   LIMA graft was visualized by angiography.   SVG graft was visualized by angiography and is large.   The graft exhibits no disease.   The graft exhibits no disease.   The graft exhibits no disease.   Severe triple vessel CAD s/p 3V CABG with 3/3 patent bypass grafts 100% mid LAD occlusion. The mid and distal LAD fills from the patent LIMA graft. The Diagonal branch fills  from the patent vein graft.  100% mid Circumflex occlusion. The vein graft to the OM branch is patent The RCA has chronic proximal total occlusion. The distal vessel fills from left to right collaterals through the vein graft to OM.  Severe aortic stenosis by echo (by cath the mean gradient is 32 mmhg, peak to peak gradient is 38 mmHg, AVA 1.0 cm2).    Recommendations: Will continue medical management of CAD. Continue workup for TAVR.    Indications  Severe aortic stenosis [I35.0 (ICD-10-CM)]    Procedural Details  Technical Details Indication: 75 yo male with history of CAD s/p 3V CABG, severe low flow,low gradient AS, TAVR workup  Procedure: The risks, benefits, complications, treatment options, and expected outcomes were discussed with the patient. The patient and/or family concurred with the proposed plan, giving informed consent. The patient was brought to the cath lab after IV hydration was given. The patient was sedated with Versed and Fentanyl. The IV catheter present in the right antecubital vein was changed for a 5 [place] sheath. Right heart catheterization performed with a balloon tipped catheter. The left wrist was prepped and draped in a sterile fashion. 1% lidocaine was used for local anesthesia. Using the modified Seldinger access technique, a 5 French sheath was placed in the leftt radial artery. 3 mg Verapamil was given through the sheath. 4500 units IV heparin was given. Standard diagnostic catheters were used to perform selective coronary angiography. I engaged the LIMA graft and SVG to OM with the JR4 catheter. I engaged the SVG to Diagonal with an LCB catheter. The aortic valve was crossed with an AL-1 and a J wire. The sheath was removed from the left radial artery and a Terumo hemostasis band was applied at the arteriotomy site on the left wrist.      Estimated blood loss <50 mL.   During this procedure medications were administered to achieve and maintain moderate conscious sedation while the patient's heart rate, blood pressure, and oxygen saturation were continuously monitored and I was present face-to-face 100% of this time.   Medications (Filter: Administrations occurring from 0828 to 0943 on 01/20/21)  important  Continuous medications are totaled by the amount administered until 01/20/21 0943.   Heparin (Porcine) in NaCl 1000-0.9 UT/500ML-% SOLN (mL) Total volume:  1,000 mL Date/Time Rate/Dose/Volume Action   01/20/21 0834 500 mL Given   0834 500 mL  Given    lidocaine (PF) (XYLOCAINE) 1 % injection (mL) Total volume:  4 mL Date/Time Rate/Dose/Volume Action   01/20/21 0855 2 mL Given   0903 2 mL Given    midazolam (VERSED) injection (mg) Total dose:  1 mg Date/Time Rate/Dose/Volume Action   01/20/21 0856 1 mg Given    fentaNYL (SUBLIMAZE) injection (mcg) Total dose:  25 mcg Date/Time Rate/Dose/Volume Action   01/20/21 0856 25 mcg Given    Radial Cocktail/Verapamil only (mL) Total volume:  10 mL Date/Time Rate/Dose/Volume Action   01/20/21 Canceled Entry   0904 10 mL Given    heparin sodium (porcine) injection (Units) Total dose:  4,500 Units Date/Time Rate/Dose/Volume Action   01/20/21 0909 4,500 Units Given    iohexol (OMNIPAQUE) 350 MG/ML injection (mL) Total volume:  90 mL Date/Time Rate/Dose/Volume Action   01/20/21 0934 90 mL Given    Sedation Time  Sedation Time Physician-1: 35 minutes 47 seconds Contrast  Medication Name Total Dose  iohexol (OMNIPAQUE) 350 MG/ML injection 90 mL   Radiation/Fluoro  Fluoro time: 13.7 (min) DAP: [id number] (mGycm2) Cumulative Air  Kerma: 765 (mGy) Coronary Findings  Diagnostic Dominance: Right Left Anterior Descending  Vessel is large.  Prox LAD lesion is 70% stenosed.  Mid LAD lesion is 100% stenosed. The lesion is chronically occluded.  Left Circumflex  Vessel is large.  Prox Cx lesion is 99% stenosed.  Prox Cx to Mid Cx lesion is 100% stenosed. The lesion is chronically occluded.  Right Coronary Artery  Vessel is large.  Prox RCA lesion is 100% stenosed. The lesion is chronically occluded.  Third Right Posterolateral Branch  Collaterals  3rd RPL filled by collaterals from 3rd Mrg.    Saphenous Graft To 2nd Mrg  SVG graft was visualized by angiography and is large. The graft exhibits no disease.  LIMA LIMA Graft To Dist LAD  LIMA graft was visualized by angiography. The graft exhibits no disease.  Saphenous Graft To 2nd Diag  SVG graft was visualized by  angiography and is large. The graft exhibits no disease.  Intervention  No interventions have been documented. Coronary Diagrams  Diagnostic Dominance: Right Intervention  Implants     No implant documentation for this case.   Syngo Images   Show images for CARDIAC CATHETERIZATION Images on Long Term Storage   Show images for Moishy, Romm to Procedure Log  Procedure Log    Hemo Data  Flowsheet Row Most Recent Value  Fick Cardiac Output 4.28 L/min  Fick Cardiac Output Index 2.05 (L/min)/BSA  Aortic Mean Gradient 32.3 mmHg  Aortic Peak Gradient 38 mmHg  Aortic Valve Area 1.04  Aortic Value Area Index 0.5 cm2/BSA  RA A Wave 5 mmHg  RA V Wave 9 mmHg  RA Mean 6 mmHg  RV Systolic Pressure 47 mmHg  RV Diastolic Pressure 3 mmHg  RV EDP 6 mmHg  PA Systolic Pressure 52 mmHg  PA Diastolic Pressure 20 mmHg  PA Mean 34 mmHg  PW A Wave 24 mmHg  PW V Wave 29 mmHg  PW Mean 23 mmHg  AO Systolic Pressure A999333 mmHg  AO Diastolic Pressure 59 mmHg  AO Mean 81 mmHg  LV Systolic Pressure 123456 mmHg  LV Diastolic Pressure 7 mmHg  LV EDP 20 mmHg  AOp Systolic Pressure 123XX123 mmHg  AOp Diastolic Pressure 50 mmHg  AOp Mean Pressure 79 mmHg  LVp Systolic Pressure 0000000 mmHg  LVp Diastolic Pressure 6 mmHg  LVp EDP Pressure 22 mmHg  QP/QS 1  TPVR Index 16.58 HRUI  TSVR Index 39.51 HRUI  PVR SVR Ratio 0.15  TPVR/TSVR Ratio 0.42    Narrative & Impression  CLINICAL DATA:  75 year old male with history of severe aortic stenosis. Preprocedural study prior to potential transcatheter aortic valve replacement (TAVR) procedure.   EXAM: CT ANGIOGRAPHY CHEST, ABDOMEN AND PELVIS   TECHNIQUE: Multidetector CT imaging through the chest, abdomen and pelvis was performed using the standard protocol during bolus administration of intravenous contrast. Multiplanar reconstructed images and MIPs were obtained and reviewed to evaluate the vascular anatomy.   CONTRAST:  47m OMNIPAQUE  IOHEXOL 350 MG/ML SOLN   COMPARISON:  Chest CTA 12/17/2020. No prior CT the abdomen or pelvis.   FINDINGS: CTA CHEST FINDINGS   Cardiovascular: Heart size is enlarged with left atrial dilatation. There is no significant pericardial fluid, thickening or pericardial calcification. There is aortic atherosclerosis, as well as atherosclerosis of the great vessels of the mediastinum and the coronary arteries, including calcified atherosclerotic plaque in the left main, left anterior descending, left circumflex and right coronary arteries. Status post median sternotomy for CABG including  LIMA to the LAD. Severe thickening and calcification of the aortic valve. Severe calcifications of the mitral annulus.   Mediastinum/Lymph Nodes: No pathologically enlarged mediastinal or hilar lymph nodes. Esophagus is unremarkable in appearance. No axillary lymphadenopathy.   Lungs/Pleura: Small bilateral pleural effusions lying dependently. No acute consolidative airspace disease. No suspicious appearing pulmonary nodules or masses are noted.   Musculoskeletal/Soft Tissues: Median sternotomy wires. There are no aggressive appearing lytic or blastic lesions noted in the visualized portions of the skeleton.   CTA ABDOMEN AND PELVIS FINDINGS   Hepatobiliary: No suspicious cystic or solid hepatic lesions. No intra or extrahepatic biliary ductal dilatation. Gallbladder is normal in appearance.   Pancreas: No pancreatic mass. No pancreatic ductal dilatation. No pancreatic or peripancreatic fluid collections or inflammatory changes.   Spleen: Unremarkable.   Adrenals/Urinary Tract: Several low-attenuation lesions in both kidneys compatible with simple cysts measuring up to 1.6 cm in diameter in the lower pole of the right kidney and 1.8 cm in diameter in the lower pole the left kidney. Other subcentimeter low-attenuation lesions are also noted in both kidneys, too small to characterize, but  statistically likely to represent tiny cysts. No hydroureteronephrosis. Urinary bladder is normal in appearance. Bilateral adrenal glands are normal in appearance.   Stomach/Bowel: The appearance of the stomach is normal. No pathologic dilatation of small bowel or colon. Numerous colonic diverticulae are noted, without surrounding inflammatory changes to suggest an acute diverticulitis at this time. Normal appendix.   Vascular/Lymphatic: Aortic atherosclerosis with vascular findings and measurements pertinent to potential TAVR procedure, as detailed below. 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 No lymphadenopathy noted in the abdomen or pelvis.   Reproductive: Prostate gland and seminal vesicles are unremarkable in appearance.   Other: No significant volume of ascites.  No pneumoperitoneum.   Musculoskeletal: There are no aggressive appearing lytic or blastic lesions noted in the visualized portions of the skeleton.   VASCULAR MEASUREMENTS PERTINENT TO TAVR:   AORTA:   Minimal Aortic Diameter-16 x 11 mm   Severity of Aortic Calcification-severe   RIGHT PELVIS:   Right Common Iliac Artery -   Minimal Diameter-7.7 x 6.0 mm   Tortuosity-mild   Calcification-severe   Right External Iliac Artery -   Minimal Diameter-6.1 x 4.3 mm   Tortuosity - mild   Calcification - severe   Right Common Femoral Artery -   Minimal Diameter-5.8 x 3.6 mm   Tortuosity - mild   Calcification - severe   LEFT PELVIS:   Left Common Iliac Artery -   Minimal Diameter-7.4 x 6.7 mm   Tortuosity - mild   Calcification - severe   Left External Iliac Artery -   Minimal Diameter-5.3 x 4.3 mm   Tortuosity - mild   Calcification - severe   Left Common Femoral Artery -   Minimal Diameter-5.7 x 4.5 mm   Tortuosity - mild   Calcification - severe   Review of the MIP images confirms the above findings.    IMPRESSION: 1. Vascular findings and measurements pertinent to potential TAVR procedure, as detailed above. 2. Severe thickening calcification of the aortic valve, compatible with reported clinical history of severe aortic stenosis. 3. Aortic atherosclerosis, in addition to left main and 3 vessel coronary artery disease. Status post median sternotomy for CABG including LIMA to the LAD. 4. Small bilateral pleural effusions lying dependently. 5. Colonic diverticulosis without evidence of acute diverticulitis at this time. 6.  Additional incidental findings, as above.     Electronically Signed   By: Vinnie Langton M.D.   On: 12/30/2020 15:05     STS Risk Score: Risk of Mortality: 1.363% Renal Failure: 1.810% Permanent Stroke: 0.801% Prolonged Ventilation: 8.281% DSW Infection: 0.234% Reoperation: 3.088% Morbidity or Mortality: 11.386% Short Length of Stay: 40.334% Long Length of Stay: 5.348%   Impression:  This 75 year old gentleman has stage D, severe, symptomatic aortic stenosis with New York Heart Association class III symptoms of exertional fatigue and shortness of breath as well as some symptoms at rest and orthopnea consistent with chronic diastolic congestive heart failure.  I have personally reviewed his 2D echocardiogram, cardiac catheterization, and CTA studies.  His echocardiogram shows a calcified and thickened aortic valve with restricted leaflet mobility.  The mean gradient is 33.5 mmHg with an aortic valve area of 0.59 cm consistent with severe aortic stenosis.  There is mild aortic insufficiency.  Left ventricular ejection fraction is 60 to 65%.  Cardiac catheterization shows severe native three-vessel coronary disease with 3 out of 3 patent bypass grafts.  I agree that aortic valve replacement is indicated in this patient for relief of his symptoms and to prevent progressive left ventricular deterioration.  He has had 3 admissions over the past few months  for congestive heart failure.  Given his age and prior coronary bypass surgery I think that transcatheter aortic valve replacement is the best option for him.  His gated cardiac CTA shows anatomy suitable for TAVR using a 26 mm Edwards SAPIEN 3 valve.  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  Some of this calcification is concentric and I think transfemoral insertion would have significant risk of vascular complications.  His left common carotid artery and left subclavian artery appear to be of adequate size with no significant disease.  Since he has a patent left internal mammary graft I think the best option is to use his left common carotid artery.  I discussed that with him.  The patient was counseled at length regarding treatment alternatives for management of severe symptomatic aortic stenosis. The risks and benefits of surgical intervention has been discussed in detail. Long-term prognosis with medical therapy was discussed. Alternative approaches such as conventional surgical aortic valve replacement, transcatheter aortic valve replacement, and palliative medical therapy were compared and contrasted at length. This discussion was placed in the context of the patient's own specific clinical presentation and past medical history. All of his questions have been addressed.   Following the decision to proceed with transcatheter aortic valve replacement, a discussion was held regarding what types of management strategies would be attempted intraoperatively in the event of life-threatening complications, including whether or not the patient would be considered a candidate for the use of cardiopulmonary bypass and/or conversion to open sternotomy for attempted surgical intervention. Given his prior coronary bypass surgery, age, and frailty I do not think he would be a candidate for emergent sternotomy to manage any intraoperative complications.  The patient is aware  of the fact that transient use of cardiopulmonary bypass may be necessary. The patient has been advised of a variety of complications that might develop including but not limited to risks of death, stroke, paravalvular leak, aortic dissection or other major vascular complications, aortic annulus rupture, device embolization, cardiac rupture or perforation, mitral regurgitation, acute myocardial infarction, arrhythmia, heart block or bradycardia requiring permanent pacemaker placement, congestive heart failure, respiratory failure, renal failure, pneumonia, infection, other late complications related  to structural valve deterioration or migration, or other complications that might ultimately cause a temporary or permanent loss of functional independence or other long term morbidity. The patient provides full informed consent for the procedure as described and all questions were answered.      Plan:  He will be scheduled for transcatheter aortic valve replacement using a SAPIEN 3 valve with left common carotid artery access on 02/08/2021.  I instructed him to take his Coumadin on Thursday this week and then stop it after that dose.  I spent 60 minutes performing this consultation and > 50% of this time was spent face to face counseling and coordinating the care of this patient's severe symptomatic aortic stenosis.   Gaye Pollack, MD 02/02/2021

## 2021-02-01 NOTE — Therapy (Signed)
[place], [place], [address] Phone: [phone]   Fax:  [phone]  Physical Therapy Evaluation  Patient Details  Name: Rodney Friedman MRN: [id number] Date of Birth: 29-May-1945 Referring Provider (PT): Burnell Blanks, MD   Encounter Date: 02/01/2021   PT End of Session - 02/01/21 1436     Visit Number 1    PT Start Time [id number]    PT Stop Time [id number]    PT Time Calculation (min) 39 min    Equipment Utilized During Treatment Gait belt    Activity Tolerance Patient limited by fatigue    Behavior During Therapy [facility] for tasks assessed/performed             Past Medical History:  Diagnosis Date   Adenomatous colon polyp    Alcoholism ([place])    Anxiety    Atrial fibrillation ([place])    CAD (coronary artery disease)    Cataract    removed left eye    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   Depression    Diabetes mellitus without complication ([place])    Diverticulosis    Eye abnormality    Macular scarring R eye   Glaucoma    Heart murmur    HLD (hyperlipidemia)    Hypertension    Leukemia ([place])    CLL   Myocardial infarction ([place]) 2006   Prostate cancer ([place]) 07/08/2013   [facility] at [facility]- getting Lupron shot every 6 months - traces in prostate and 2 lymphnodes left hip- non focused traces per pt    Sleep apnea    Substance abuse ([place])    Tremor, essential 09/22/2015    Past Surgical History:  Procedure Laterality Date   Arm Surgery Right    from door accident with glass   CATARACT EXTRACTION W/ INTRAOCULAR LENS IMPLANT Bilateral    COLONOSCOPY     CORONARY ARTERY BYPASS GRAFT  08/04/2004   POLYPECTOMY     RIGHT/LEFT HEART CATH AND CORONARY/GRAFT ANGIOGRAPHY N/A 01/20/2021   Procedure: RIGHT/LEFT HEART CATH AND CORONARY/GRAFT ANGIOGRAPHY;  Surgeon: Burnell Blanks, MD;  Location: [place] [facility];  Service: Cardiovascular;  Laterality: N/A;    There were  no vitals filed for this visit.    Subjective Assessment - 02/01/21 1437     Subjective Patient reports having open heart surgery in 2006. He reports needing a valve replacement and needed it months ago. He has been in the hospital for CHF recently. "I feel like walking death." He reports the chest feels tight and he gasps for every breath that can last for hours and becomes frightening. He was recently put on Lasix, which has helped with the breathing. Patient reports these symptoms have been ongoing for about 10 years. He gets exhausted even walking fom the bedroom to the kitchen. He denies any chest pain, dizziness, or light-headedness.    Currently in Pain? No/denies                [facility] PT Assessment - 02/01/21 0001       Assessment   Medical Diagnosis Severe Aortic Stenosis, Pre TAVR PT Eval    Referring Provider (PT) Burnell Blanks, MD    Onset Date/Surgical Date --   24 years   Hand Dominance Right    Next MD Visit later today    Prior Therapy yes- home health PT      Precautions   Precautions None  Restrictions   Weight Bearing Restrictions No      Balance Screen   Has the patient fallen in the past 6 months No      [place] residence    Living Arrangements Alone    Additional Comments 4 stairs to enter      Prior Function   Level of Independence Independent   needs help with yardwork   Vocation Retired      Posture/Postural Control   Posture/Postural Control Postural limitations    Postural Limitations Rounded Shoulders;Forward head;Increased thoracic kyphosis      ROM / Strength   AROM / PROM / Strength AROM      AROM   Overall AROM Comments BUE/LE AROM WNL      Strength   Overall Strength Comments gross BUE 4-/5; gross BLE 4/5    Right Hand Grip (lbs) 50    Left Hand Grip (lbs) 80              [facility] Pre-Surgical Assessment - 02/01/21 0001     5 Meter Walk Test- trial 1 10 sec    5 Meter  Walk Test- trial 2 8 sec.     5 Meter Walk Test- trial 3 8 sec.    5 meter walk test average 8.67 sec    4 Stage Balance Test tolerated for:  10 sec.    4 Stage Balance Test Position 2    Sit To Stand Test- trial 1 30 sec.    6 Minute Walk- Baseline yes    BP (mmHg) 132/62    HR (bpm) 89    02 Sat (%RA) 99 %    Modified Borg Scale for Dyspnea 0.5- Very, very slight shortness of breath    Perceived Rate of Exertion (Borg) 8-    BP (mmHg) 139/86    HR (bpm) 85    02 Sat (%RA) 99 %    Modified Borg Scale for Dyspnea 5- Strong or hard breathing    Perceived Rate of Exertion (Borg) 15- Hard    Aerobic Endurance Distance Walked 230    Endurance additional comments 86.7% disability compared to age related normative value                      Objective measurements completed on examination: See above findings.                             Plan - 02/01/21 1500     PT Frequency One time visit    Consulted and Agree with Plan of Care Patient             Clinical Impression Statement: Pt is a 75 yo male presenting to OP PT for evaluation prior to possible TAVR surgery due to severe aortic stenosis. Pt reports onset of chest tightness and SOB years ago. Symptoms are  limiting ability to perform ADLs. Pt presents with WNL ROM and strength and 0/10 musculoskeletal pain.  Pt ambulated 115 feet in 1 minute before requesting a seated rest beak lasting 1 minutes. At time of rest, patient's HR was 56 bpm and O2 was 97 on room air. Pt reported 3/10 shortness of breath on modified scale for dyspnea. Pt able to resume after rest and ambulate an additional 115 feet.Pt ambulated a total of 230 feet in 6 minute walk and reported 5/10 SOB on modified scale for dyspena and  15/20 RPE on Borg's perceived exertion and pain scale at the end of the walk. During the 6 minute walk test, patient's HR increased to 129 BPM and O2 saturation decreased to 97%. Based on the Short  Physical Performance Battery, patient has a frailty rating of 5/12 with </= 5/12 considered frail.  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   Chronic pain 03/14/2019   Other social stressor 07/27/2018   Essential hypertension 07/27/2018   Diabetes mellitus type 2 in obese ([place]) 07/27/2018   Paroxysmal atrial fibrillation ([place]) 04/21/2016   Diabetes mellitus ([place]) 04/21/2016   OSA (obstructive sleep apnea) 04/21/2016   Quality of life palliative care encounter 01/03/2016   Bilateral carotid bruits without stenosis 01/02/2016   Tremor, essential 09/22/2015   Glaucoma, open angle 06/16/2015   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   Pseudoaphakia 06/15/2015   CD (contact dermatitis) 04/29/2014   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   Hematuria 01/05/2014   Skin lesion 01/05/2014   Aortic valve stenosis, nonrheumatic 01/03/2014   Hypertensive heart disease 01/03/2014   Mixed hyperlipidemia 01/03/2014   Mild obesity 01/03/2014   Tobacco abuse 01/03/2014   Chronic atrial fibrillation ([place]) 12/31/2013   Long term (current) use of anticoagulants 12/31/2013   Cataract, nuclear 10/06/2013   Dermatochalasis of eyelid 10/06/2013   Chorioretinal scar, 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   Coronary artery disease of native artery of native heart with stable angina pectoris ([place]) 09/03/2013   Pre-diabetes 09/03/2013   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 [place], [place], [address] Phone: [phone]   Fax:  [phone]  Name: Jazhiel Neupert MRN: [id number] Date of Birth: 14-Aug-1945

## 2021-02-02 ENCOUNTER — Other Ambulatory Visit: Payer: Self-pay

## 2021-02-02 DIAGNOSIS — I350 Nonrheumatic aortic (valve) stenosis: Secondary | ICD-10-CM

## 2021-02-02 NOTE — Telephone Encounter (Signed)
Received the following information from patient:   "Called [employer] Prescription Drug Plan administered by [facility]. Got right through [phone] (customer care) or Pharmacy Help Desk for Providers at [phone]. My name and ID MORY NEWMYER, [id number]. Agent related that this is a tier II drug  (Incruse) and any alternative at Tier II would also be $65, Agent suggested doctor review OTC inhalers on market which would not be covered at all which might give adequate relief and none that would interfere with current medications. Seems some asthma ones might give adequate relief. I am getting the replacement aortic valve operation on Sept 20, [facility], doctors [place]. "

## 2021-02-02 NOTE — Telephone Encounter (Signed)
Usually tier 1 and 2 are the cheapest tiers. For most insurance companies, the only medications offered as a tier 1 are albuterol and Duonebs. I checked his formulary, Perforomist is the only nebulizer solution covered as a tier 1 medication beside regular albuterol and Duonebs.

## 2021-02-03 NOTE — Pre-Procedure Instructions (Signed)
Surgical Instructions   Your procedure is scheduled on Tuesday, September 20th. Report to [facility] Main Entrance "A" at 07:00 A.M., then check in with the Admitting office. Call this number if you have problems the morning of surgery: [phone]   If you have any questions prior to your surgery date call [phone]: Open Monday-Friday 8am-4pm   Remember: Do not eat or drink after midnight the night before your surgery  STOP now taking any Aspirin (unless otherwise instructed by your surgeon), Aleve, Naproxen, Ibuprofen, Motrin, Advil, Goody's, BC's, all herbal medications, fish oil, and all vitamins.  Stop taking Warfarin on 9/16 (Friday). You will take your last dose on 9/15 (Thursday).  Stop taking Metformin on 9/18 (Sunday). You will take your last dose on 9/17 (Saturday).   Continue taking all other medications without change through the day before surgery. On the morning of surgery do not take any medications.      HOW TO MANAGE YOUR DIABETES BEFORE AND AFTER SURGERY  Why is it important to control my blood sugar before and after surgery? Improving blood sugar levels before and after surgery helps healing and can limit problems. A way of improving blood sugar control is eating a healthy diet by:  Eating less sugar and carbohydrates  Increasing activity/exercise  Talking with your doctor about reaching your blood sugar goals High blood sugars (greater than 180 mg/dL) can raise your risk of infections and slow your recovery, so you will need to focus on controlling your diabetes during the weeks before surgery. Make sure that the doctor who takes care of your diabetes knows about your planned surgery including the date and location.  How do I manage my blood sugar before surgery? Check your blood sugar at least 4 times a day, starting 2 days before surgery, to make sure that the level is not too high or low.  Check your blood sugar the morning of your surgery when you  wake up and every 2 hours until you get to the Short Stay unit.  If your blood sugar is less than 70 mg/dL, you will need to treat for low blood sugar: Do not take insulin. Treat a low blood sugar (less than 70 mg/dL) with  cup of clear juice (cranberry or apple), 4 glucose tablets, OR glucose gel. Recheck blood sugar in 15 minutes after treatment (to make sure it is greater than 70 mg/dL). If your blood sugar is not greater than 70 mg/dL on recheck, call [phone] for further instructions. Report your blood sugar to the short stay nurse when you get to Short Stay.  If you are admitted to the hospital after surgery: Your blood sugar will be checked by the staff and you will probably be given insulin after surgery (instead of oral diabetes medicines) to make sure you have good blood sugar levels. The goal for blood sugar control after surgery is 80-180 mg/dL.                Do NOT Smoke (Tobacco/Vaping)  24 hours prior to your procedure If you use a CPAP at night, you may bring your mask for your overnight stay.   Contacts, glasses, dentures or bridgework may not be worn into surgery, please bring cases for these belongings   For patients admitted to the hospital, discharge time will be determined by your treatment team.   Patients discharged the day of surgery will not be allowed to drive home, and someone needs to stay with them for 24 hours.  NO VISITORS WILL BE ALLOWED IN PRE-OP WHERE PATIENTS GET READY FOR SURGERY.  ONLY 1 SUPPORT PERSON MAY BE PRESENT WHILE YOU ARE IN SURGERY.  IF YOU ARE TO BE ADMITTED, ONCE YOU ARE IN YOUR ROOM YOU WILL BE ALLOWED TWO (2) VISITORS.  Minor children may have two parents present. Special consideration for safety and communication needs will be reviewed on a case by case basis.  Special instructions:    Oral Hygiene is also important to reduce your risk of infection.  Remember - BRUSH YOUR TEETH THE MORNING OF SURGERY WITH YOUR REGULAR  TOOTHPASTE   [place]- Preparing For Surgery  Before surgery, you can play an important role. Because skin is not sterile, your skin needs to be as free of germs as possible. You can reduce the number of germs on your skin by washing with CHG (chlorahexidine gluconate) Soap before surgery.  CHG is an antiseptic cleaner which kills germs and bonds with the skin to continue killing germs even after washing.     Please do not use if you have an allergy to CHG or antibacterial soaps. If your skin becomes reddened/irritated stop using the CHG.  Do not shave (including legs and underarms) for at least 48 hours prior to first CHG shower. It is OK to shave your face.  Please follow these instructions carefully.     Shower the NIGHT BEFORE SURGERY and the MORNING OF SURGERY with CHG Soap.   If you chose to wash your hair, wash your hair first as usual with your normal shampoo. After you shampoo, rinse your hair and body thoroughly to remove the shampoo.  Then [employer] and genitals (private parts) with your normal soap and rinse thoroughly to remove soap.  After that Use CHG Soap as you would any other liquid soap. You can apply CHG directly to the skin and wash gently with a scrungie or a clean washcloth.   Apply the CHG Soap to your body ONLY FROM THE NECK DOWN.  Do not use on open wounds or open sores. Avoid contact with your eyes, ears, mouth and genitals (private parts). Wash Face and genitals (private parts)  with your normal soap.   Wash thoroughly, paying special attention to the area where your surgery will be performed.  Thoroughly rinse your body with warm water from the neck down.  DO NOT shower/wash with your normal soap after using and rinsing off the CHG Soap.  Pat yourself dry with a CLEAN TOWEL.  Wear CLEAN PAJAMAS to bed the night before surgery  Place CLEAN SHEETS on your bed the night before your surgery  DO NOT SLEEP WITH PETS.   Day of Surgery:  Do not wear  jewelry  Do not wear lotions, powders, colognes, or deodorant. Men may shave face and neck. Do not bring valuables to the hospital. [facility] is not responsible for any belongings or valuables. Take a shower with CHG soap. Wear Clean/Comfortable clothing the morning of surgery Do not apply any deodorants/lotions.   Remember to brush your teeth WITH YOUR REGULAR TOOTHPASTE.   Please read over the following fact sheets that you were given.

## 2021-02-04 ENCOUNTER — Other Ambulatory Visit: Payer: Self-pay

## 2021-02-04 ENCOUNTER — Encounter (HOSPITAL_COMMUNITY)
Admission: RE | Admit: 2021-02-04 | Discharge: 2021-02-04 | Disposition: A | Discharge status: Home / Self Care | Payer: Medicare Other | Source: Ambulatory Visit | Attending: Cardiovascular Disease | Admitting: Cardiovascular Disease

## 2021-02-04 ENCOUNTER — Encounter (HOSPITAL_COMMUNITY): Payer: Self-pay

## 2021-02-04 ENCOUNTER — Ambulatory Visit (HOSPITAL_COMMUNITY)
Admission: RE | Admit: 2021-02-04 | Discharge: 2021-02-04 | Disposition: A | Discharge status: Home / Self Care | Payer: Medicare Other | Source: Ambulatory Visit | Attending: Cardiovascular Disease | Admitting: Cardiovascular Disease

## 2021-02-04 DIAGNOSIS — Z01812 Encounter for preprocedural laboratory examination: Secondary | ICD-10-CM | POA: Insufficient documentation

## 2021-02-04 DIAGNOSIS — Z20822 Contact with and (suspected) exposure to covid-19: Secondary | ICD-10-CM | POA: Insufficient documentation

## 2021-02-04 DIAGNOSIS — I350 Nonrheumatic aortic (valve) stenosis: Secondary | ICD-10-CM

## 2021-02-04 DIAGNOSIS — J90 Pleural effusion, not elsewhere classified: Secondary | ICD-10-CM | POA: Diagnosis not present

## 2021-02-04 DIAGNOSIS — I2510 Atherosclerotic heart disease of native coronary artery without angina pectoris: Secondary | ICD-10-CM | POA: Insufficient documentation

## 2021-02-04 DIAGNOSIS — Z01818 Encounter for other preprocedural examination: Secondary | ICD-10-CM

## 2021-02-04 DIAGNOSIS — E119 Type 2 diabetes mellitus without complications: Secondary | ICD-10-CM | POA: Insufficient documentation

## 2021-02-04 DIAGNOSIS — E785 Hyperlipidemia, unspecified: Secondary | ICD-10-CM | POA: Diagnosis not present

## 2021-02-04 DIAGNOSIS — I10 Essential (primary) hypertension: Secondary | ICD-10-CM | POA: Insufficient documentation

## 2021-02-04 DIAGNOSIS — Z23 Encounter for immunization: Secondary | ICD-10-CM | POA: Diagnosis not present

## 2021-02-04 HISTORY — DX: Dyspnea, unspecified: R06.00

## 2021-02-04 HISTORY — DX: Cardiac arrhythmia, unspecified: I49.9

## 2021-02-04 LAB — URINALYSIS, ROUTINE W REFLEX MICROSCOPIC
Bilirubin Urine: NEGATIVE
Glucose, UA: NEGATIVE mg/dL
Hgb urine dipstick: NEGATIVE
Ketones, ur: NEGATIVE mg/dL
Leukocytes,Ua: NEGATIVE
Nitrite: NEGATIVE
Protein, ur: NEGATIVE mg/dL
Specific Gravity, Urine: 1.013 (ref 1.005–1.030)
pH: 5 (ref 5.0–8.0)

## 2021-02-04 LAB — BLOOD GAS, ARTERIAL
Acid-base deficit: 2.4 mmol/L — ABNORMAL HIGH (ref 0.0–2.0)
Bicarbonate: 21.3 mmol/L (ref 20.0–28.0)
Drawn by: 58793
FIO2: 21
O2 Saturation: 98 %
Patient temperature: 37
pCO2 arterial: 33.1 mmHg (ref 32.0–48.0)
pH, Arterial: 7.424 (ref 7.350–7.450)
pO2, Arterial: 102 mmHg (ref 83.0–108.0)

## 2021-02-04 LAB — CBC
HCT: 38.5 % — ABNORMAL LOW (ref 39.0–52.0)
Hemoglobin: 13.1 g/dL (ref 13.0–17.0)
MCH: 30.2 pg (ref 26.0–34.0)
MCHC: 34 g/dL (ref 30.0–36.0)
MCV: 88.7 fL (ref 80.0–100.0)
Platelets: 192 10*3/uL (ref 150–400)
RBC: 4.34 MIL/uL (ref 4.22–5.81)
RDW: 14.5 % (ref 11.5–15.5)
WBC: 14.4 10*3/uL — ABNORMAL HIGH (ref 4.0–10.5)
nRBC: 0 % (ref 0.0–0.2)

## 2021-02-04 LAB — COMPREHENSIVE METABOLIC PANEL
ALT: 49 U/L — ABNORMAL HIGH (ref 0–44)
AST: 46 U/L — ABNORMAL HIGH (ref 15–41)
Albumin: 3.6 g/dL (ref 3.5–5.0)
Alkaline Phosphatase: 36 U/L — ABNORMAL LOW (ref 38–126)
Anion gap: 10 (ref 5–15)
BUN: 22 mg/dL (ref 8–23)
CO2: 20 mmol/L — ABNORMAL LOW (ref 22–32)
Calcium: 9.4 mg/dL (ref 8.9–10.3)
Chloride: 107 mmol/L (ref 98–111)
Creatinine, Ser: 1.35 mg/dL — ABNORMAL HIGH (ref 0.61–1.24)
GFR, Estimated: 55 mL/min — ABNORMAL LOW (ref >60)
Glucose, Bld: 116 mg/dL — ABNORMAL HIGH (ref 70–99)
Potassium: 4.2 mmol/L (ref 3.5–5.1)
Sodium: 137 mmol/L (ref 135–145)
Total Bilirubin: 1 mg/dL (ref 0.3–1.2)
Total Protein: 6.2 g/dL — ABNORMAL LOW (ref 6.5–8.1)

## 2021-02-04 LAB — TYPE AND SCREEN
ABO/RH(D): A POS
Antibody Screen: NEGATIVE

## 2021-02-04 LAB — SARS CORONAVIRUS 2 (TAT 6-24 HRS): SARS Coronavirus 2: NEGATIVE

## 2021-02-04 LAB — PROTIME-INR
INR: 2.9 — ABNORMAL HIGH (ref 0.8–1.2)
Prothrombin Time: 30.1 seconds — ABNORMAL HIGH (ref 11.4–15.2)

## 2021-02-04 LAB — GLUCOSE, CAPILLARY: Glucose-Capillary: 152 mg/dL — ABNORMAL HIGH (ref 70–99)

## 2021-02-04 LAB — SURGICAL PCR SCREEN
MRSA, PCR: NEGATIVE
Staphylococcus aureus: NEGATIVE

## 2021-02-04 NOTE — Progress Notes (Signed)
PCP -  Dr. Kristie Cowman Cardiologist - Dr. Dani Gobble Croitoru  PPM/ICD - denies   Chest x-ray - 02/04/21 at PAT appt EKG - 01/24/21 Stress Test - 10/2011 ECHO - 12/17/20 Cardiac Cath - 01/20/21  Sleep Study - Pt states he had a sleep study around 2010 and was diagnosed with sleep apnea but could "never tolerate wearing a CPAP" CPAP - No  DM: Type 2 Pt does not check CBG  Blood Thinner Instructions: Stop Warfarin on 9/16. Pt states he stopped taking this on 9/14 Aspirin Instructions: Stop taking ASA and all NSAIDs now 9/16.  ERAS Protcol - No   COVID TEST- 02/04/21 at PAT appt, results pending   Anesthesia review: yes, cardiac hx  Patient denies shortness of breath, fever, cough and chest pain at PAT appointment   All instructions explained to the patient, with a verbal understanding of the material. Patient agrees to go over the instructions while at home for a better understanding.  The opportunity to ask questions was provided.

## 2021-02-04 NOTE — Progress Notes (Signed)
Carotid duplex has been completed.   Preliminary results in CV Proc.   Rodney Friedman Stay 02/04/2021 2:06 PM

## 2021-02-06 ENCOUNTER — Other Ambulatory Visit: Payer: Self-pay

## 2021-02-06 DIAGNOSIS — I350 Nonrheumatic aortic (valve) stenosis: Secondary | ICD-10-CM

## 2021-02-07 MED ORDER — POTASSIUM CHLORIDE 2 MEQ/ML IV SOLN
80.0000 meq | INTRAVENOUS | Status: DC
  Filled 2021-02-07: qty 40

## 2021-02-07 MED ORDER — SODIUM CHLORIDE 0.9 % IV SOLN
INTRAVENOUS | Status: DC
  Filled 2021-02-07: qty 30

## 2021-02-07 MED ORDER — NOREPINEPHRINE 4 MG/250ML-% IV SOLN
0.0000 ug/min | INTRAVENOUS | Status: AC
  Administered 2021-02-08: 2 ug/min via INTRAVENOUS
  Filled 2021-02-07: qty 250

## 2021-02-07 MED ORDER — MAGNESIUM SULFATE 50 % IJ SOLN
40.0000 meq | INTRAMUSCULAR | Status: DC
  Filled 2021-02-07: qty 9.85

## 2021-02-07 MED ORDER — CEFAZOLIN SODIUM-DEXTROSE 2-4 GM/100ML-% IV SOLN
2.0000 g | INTRAVENOUS | Status: AC
  Administered 2021-02-08: 2 g via INTRAVENOUS
  Filled 2021-02-07 (×3): qty 100

## 2021-02-07 MED ORDER — DEXMEDETOMIDINE HCL IN NACL 400 MCG/100ML IV SOLN
0.1000 ug/kg/h | INTRAVENOUS | Status: DC
  Filled 2021-02-07 (×2): qty 100

## 2021-02-07 NOTE — Progress Notes (Signed)
Anesthesia Chart Review:  Case: [id number] Date/Time: 02/08/21 0900   Procedures:      TRANSCATHETER AORTIC VALVE REPLACEMENT, LEFT CAROTID (Left)     TRANSESOPHAGEAL ECHOCARDIOGRAM (TEE)   Anesthesia type: General   Pre-op diagnosis: Severe Aortic Stenosis   Location: [facility] CATH LAB 6 / [facility] INVASIVE CV LAB   Providers: Burnell Blanks, MD       DISCUSSION: Patient is a 75 year old male scheduled for the above procedure.  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  Reported his last dose warfarin was on 02/02/21. INR 2.9 on 02/04/21. Plan for repeat PT/INR on the day of surgery.   02/04/21 presurgical COVID-19 test negative. Anesthesia team to evaluate on the day of surgery.   VS: BP (!) 123/50   Pulse 79   Temp 36.7 C   Resp 19   Ht '5\' 8"'$  (1.727 m)   Wt 90.6 kg   SpO2 97%   BMI 30.38 kg/m    PROVIDERS: Kristie Cowman, MD is PCP  Croitoru, Dani Gobble, MD is cardiologist Heath Lark, MD is HEM-ONC. Last visit 3/122. No clinical signs of CLL disease progression. Recent PSA undetectable. One year oncology follow-up planned. Kathie Rhodes, MD is urologist   LABS: Labs reviewed: Repeat PT/INR. Cr 1.35, consistent with previous results. AST 46, ALT 49. PLT 192. WBC 14.4K (Known CLL history). UA normal. Dr. Angelena Form has marked labs as reviewed. (all labs ordered are listed, but only abnormal results are displayed)  Labs Reviewed  GLUCOSE, CAPILLARY - Abnormal; Notable for the following components:      Result Value   Glucose-Capillary 152 (*)    All other components within normal limits  CBC - Abnormal; Notable for the following components:   WBC 14.4 (*)    HCT 38.5  (*)    All other components within normal limits  COMPREHENSIVE METABOLIC PANEL - Abnormal; Notable for the following components:   CO2 20 (*)    Glucose, Bld 116 (*)    Creatinine, Ser 1.35 (*)    Total Protein 6.2 (*)    AST 46 (*)    ALT 49 (*)    Alkaline Phosphatase 36 (*)    GFR, Estimated 55 (*)    All other components within normal limits  PROTIME-INR - Abnormal; Notable for the following components:   Prothrombin Time 30.1 (*)    INR 2.9 (*)    All other components within normal limits  BLOOD GAS, ARTERIAL - Abnormal; Notable for the following components:   Acid-base deficit 2.4 (*)    All other components within normal limits  SARS CORONAVIRUS 2 (TAT 6-24 HRS)  SURGICAL PCR SCREEN  URINALYSIS, ROUTINE W REFLEX MICROSCOPIC  TYPE AND SCREEN     IMAGES: CXR 02/04/21: IMPRESSION: Improving appearance of the lungs, with residual small right pleural effusion. Surgical changes of median sternotomy  CTA Chest/abd/pelvis 12/30/20: IMPRESSION: 1. Vascular findings and measurements pertinent to potential TAVR procedure, as detailed above. 2. Severe thickening calcification of the aortic valve, compatible with reported clinical history of severe aortic stenosis. 3. Aortic atherosclerosis, in addition to left main and 3 vessel coronary artery disease. Status post median sternotomy for CABG including LIMA to the LAD. 4. Small bilateral pleural effusions lying dependently. 5. Colonic  diverticulosis without evidence of acute diverticulitis at this time. 6. Additional incidental findings, as above.    EKG: 01/24/21: Atrial fibrillation at 97 bpm Ventricular bigeminy LVH with IVCD and secondary repol abnrm Prolonged QT interval [QT 394 ms/QTc 501 ms] Confirmed by Dory Horn) on 01/24/2021 5:14:03 AM   CV: US Carotid 02/04/21 (PRELIMINARY RESULTS): Summary:  - Right Carotid: Velocities in the right ICA are consistent with a 1-39%  stenosis.  - Left Carotid:  Velocities in the left ICA are consistent with a 1-39%  stenosis.  - Vertebrals: Bilateral vertebral arteries demonstrate antegrade flow.    CT Coronary 12/30/20: IMPRESSION: 1.  Tri leaflet AV calcified with score 3420 2. Optimum angiographic angle for deployment LAO 18 Caudal 6 degrees 3. Left dominant native coronary arteries Patent SVG to distal OM and Diagonal Patent LIMA to LAD 4. Annular area of 451 mm2 suitable for a 26 mm Sapien 3 valve large bulky calcium noted at commissure between left and non cusps 5. Coronary arteries sufficient height above annulus for deployment with patent grafts 6. Normal aortic root 3.5 cm with severe calcific atherosclerosis and normal arch vessels 7.  Severe posterior annular mitral calcification   RHC/LHC 01/20/21:   Prox Cx lesion is 99% stenosed.   Prox Cx to Mid Cx lesion is 100% stenosed.   Prox LAD lesion is 70% stenosed.   Mid LAD lesion is 100% stenosed.   Prox RCA lesion is 100% stenosed.   SVG graft was visualized by angiography and is large.   LIMA graft was visualized by angiography.   SVG graft was visualized by angiography and is large.   The graft exhibits no disease.   The graft exhibits no disease.   The graft exhibits no disease.   Severe triple vessel CAD s/p 3V CABG with 3/3 patent bypass grafts 100% mid LAD occlusion. The mid and distal LAD fills from the patent LIMA graft. The Diagonal branch fills from the patent vein graft.  100% mid Circumflex occlusion. The vein graft to the OM branch is patent The RCA has chronic proximal total occlusion. The distal vessel fills from left to right collaterals through the vein graft to OM.  Severe aortic stenosis by echo (by cath the mean gradient is 32 mmhg, peak to peak gradient is 38 mmHg, AVA 1.0 cm2).  - Recommendations: Will continue medical management of CAD. Continue workup for TAVR.    Echo 12/17/20: IMPRESSIONS   1. Left ventricular ejection fraction, by estimation, is 60  to 65%. The  left ventricle has normal function. The left ventricle has no regional  wall motion abnormalities. Left ventricular diastolic parameters are  indeterminate.   2. Right ventricular systolic function is normal. The right ventricular  size is normal. There is normal pulmonary artery systolic pressure.   3. Left atrial size was severely dilated.   4. Predominantly posterior mitral annular calcification. The mitral valve  is normal in structure. Trivial mitral valve regurgitation. No evidence of  mitral stenosis. Moderate mitral annular calcification.   5. Compared with the echo 04/2020, mean aortic valve gradient has  increased from 25 mmHg to 33 mmHg. The aortic valve is normal in  structure. There is moderate calcification of the aortic valve. There is  moderate thickening of the aortic valve. Aortic  valve regurgitation is mild. Moderate aortic valve stenosis. Aortic  regurgitation PHT measures 428 msec. Aortic valve mean gradient measures  33.5 mmHg. Aortic valve Vmax measures 3.79 m/s.   6. Aortic  dilatation noted. There is mild dilatation of the ascending  aorta, measuring 37 mm.   7. The inferior vena cava is dilated in size with >50% respiratory  variability, suggesting right atrial pressure of 8 mmHg.    Past Medical History:  Diagnosis Date   Adenomatous colon polyp    Alcoholism ([place])    Anxiety    Atrial fibrillation (HCC)    CAD (coronary artery disease)    Cataract    removed left eye    CHF (congestive heart failure) ([place]) 12/16/2020   CLL (chronic lymphocytic leukemia) ([place]) 07/08/2013   Depression    Diabetes mellitus without complication (HCC)    Diverticulosis    Dyspnea    Dysrhythmia    Eye abnormality    Macular scarring R eye   Glaucoma    Heart murmur 2002   "diagnosed about 20 years ago". Pt says it causes abnormal EKGs   HLD (hyperlipidemia)    Hypertension    Leukemia ([place])    CLL   Myocardial infarction [facility]) 2006   Prostate cancer  ([place]) 07/08/2013   [facility] at [facility]- getting Lupron shot every 6 months - traces in prostate and 2 lymphnodes left hip- non focused traces per pt    Sleep apnea    Substance abuse ([place])    Tremor, essential 09/22/2015    Past Surgical History:  Procedure Laterality Date   Arm Surgery Right    from door accident with glass   CARDIAC CATHETERIZATION     CATARACT EXTRACTION W/ INTRAOCULAR LENS IMPLANT Bilateral    COLONOSCOPY     CORONARY ARTERY BYPASS GRAFT  08/04/2004   EYE SURGERY     POLYPECTOMY     RIGHT/LEFT HEART CATH AND CORONARY/GRAFT ANGIOGRAPHY N/A 01/20/2021   Procedure: RIGHT/LEFT HEART CATH AND CORONARY/GRAFT ANGIOGRAPHY;  Surgeon: Burnell Blanks, MD;  Location: [facility] CV LAB;  Service: Cardiovascular;  Laterality: N/A;    MEDICATIONS:  B Complex Vitamins (VITAMIN-B COMPLEX PO)   BIOTIN PO   buPROPion ER (WELLBUTRIN SR) 100 MG 12 hr tablet   Calcium Carbonate-Vit D-Min (CALCIUM 1200 PO)   calcium elemental as carbonate (TUMS ULTRA 1000) 400 MG chewable tablet   docusate sodium (COLACE) 100 MG capsule   ezetimibe (ZETIA) 10 MG tablet   furosemide (LASIX) 20 MG tablet   gabapentin (NEURONTIN) 100 MG capsule   ketoconazole (NIZORAL) 2 % shampoo   latanoprost (XALATAN) 0.005 % ophthalmic solution   LYCOPENE PO   Magnesium 250 MG TABS   metFORMIN (GLUCOPHAGE) 500 MG tablet   metoprolol (TOPROL-XL) 50 MG 24 hr tablet   Omega-3 Fatty Acids (FISH OIL) 1000 MG CAPS   rosuvastatin (CRESTOR) 20 MG tablet   Sodium Fluoride (PREVIDENT 5000 DRY MOUTH DT)   umeclidinium bromide (INCRUSE ELLIPTA) 62.5 MCG/INH AEPB   vitamin B-12 (CYANOCOBALAMIN) 500 MCG tablet   vitamin C (ASCORBIC ACID) 500 MG tablet   Vitamin D, Ergocalciferol, (DRISDOL) 1.25 MG (50000 UNIT) CAPS capsule   warfarin (COUMADIN) 4 MG tablet    0.9 %  sodium chloride infusion    [START ON 02/08/2021] ceFAZolin (ANCEF) IVPB 2g/100 mL premix   [START ON 02/08/2021] dexmedetomidine (PRECEDEX)  400 MCG/100ML (4 mcg/mL) infusion   [START ON 02/08/2021] heparin 30,000 units/NS 1000 mL solution for CELLSAVER   [START ON 02/08/2021] magnesium sulfate (IV Push/IM) injection 40 mEq   [START ON 02/08/2021] norepinephrine (LEVOPHED) '4mg'$  in 229m premix infusion   [START ON 02/08/2021] potassium chloride injection 80 mEq  Myra Gianotti, PA-C Surgical Short Stay/Anesthesiology [facility] Phone [phone] [facility] Phone [phone] 02/07/2021 10:57 AM

## 2021-02-07 NOTE — H&P (Addendum)
[address]       [place] [address]             [phone]      Cardiothoracic Surgery Admission History and Physical   PCP is Kristie Cowman, MD Referring Provider is Lauree Chandler, MD Primary Cardiologist is Sanda Klein, MD   Reason for admission:  Severe aortic stenosis   HPI:   The patient is a 75 year old gentleman with a history of coronary artery disease status post CABG x3 in 2006, paroxysmal atrial fibrillation, hypertension, hyperlipidemia, type 2 diabetes, former recent smoker until July 2022, OSA, CLL, prostate cancer, and aortic stenosis.  He has had moderate aortic stenosis by previous echocardiogram and has been followed by Dr. Sallyanne Kuster.  He was hospitalized with pneumonia in July 2022 which may have been congestive heart failure and since then has had increasing shortness of breath on exertion.  Echocardiogram on 12/17/2020 showed a thickened and calcified aortic valve with reduced leaflet mobility.  The mean gradient had increased to 33.5 mmHg with a peak gradient of 57.5 mmHg.  Aortic valve area was 0.59 cm with a dimensionless index of 0.16.  Stroke-volume index was 27.  Left ventricular ejection fraction was 60 to 65%.  His gated cardiac CT showed a calcium score of 3000 consistent with severe aortic stenosis.   He has had 3 admissions this summer with congestive heart failure symptoms.  He reports progressively worsening exertional shortness of breath, chest pressure, and orthopnea.  He said that at night now he frequently has to sit up all night in a chair.  He has shortness of breath with walking around his house.  He denies any dizziness or syncope.  He has had no peripheral edema.   He is divorced and lives alone in [place].  He is a retired high school history Pharmacist, hospital.  He sees a dentist regularly and takes good care of his teeth.       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   Depression     Diabetes mellitus without complication (HCC)     Diverticulosis     Eye abnormality      Macular scarring R eye   Glaucoma     Heart murmur     HLD (hyperlipidemia)     Hypertension     Leukemia ([facility])      CLL   Myocardial infarction ([facility]) 2006   Prostate cancer ([facility]) 07/08/2013    [facility] at [facility]- getting Lupron shot every 6 months - traces in prostate and 2 lymphnodes left hip- non focused traces per pt    Sleep apnea     Substance abuse ([facility])     Tremor, essential 09/22/2015           Past Surgical History:  Procedure Laterality Date   Arm Surgery Right      from door accident with glass   CATARACT EXTRACTION W/ INTRAOCULAR LENS IMPLANT Bilateral     COLONOSCOPY       CORONARY ARTERY BYPASS GRAFT   08/04/2004   POLYPECTOMY       RIGHT/LEFT HEART CATH AND CORONARY/GRAFT ANGIOGRAPHY N/A 01/20/2021  Procedure: RIGHT/LEFT HEART CATH AND CORONARY/GRAFT ANGIOGRAPHY;  Surgeon: Burnell Blanks, MD;  Location: [facility];  Service: Cardiovascular;  Laterality: N/A;           Family History  Problem Relation Age of Onset   Colon cancer Mother     Ovarian cancer Mother     Uterine cancer Mother     Hypertension Father     Prostate cancer Father     Colon polyps Neg Hx        Social History         Socioeconomic History   Marital status: Divorced      Spouse name: Not on file   Number of children: 2   Years of education: 76   Highest education level: Not on file  Occupational History   Occupation: Retired Pharmacist, hospital  Tobacco Use   Smoking status: Former      Packs/day: 0.50      Years: 50.00      Pack years: 25.00      Types: Cigarettes      Quit date: 12/16/2020      Years since quitting: 0.1   Smokeless tobacco: Never  Vaping Use   Vaping Use: Never used   Substance and Sexual Activity   Alcohol use: No      Alcohol/week: 0.0 standard drinks   Drug use: No   Sexual activity: Not Currently  Other Topics Concern   Not on file  Social History Narrative    Lives at home w/ his father    Divorced    Right-handed    Education: Secretary/administrator    No caffeine    Social Determinants of Adult nurse Strain: Not on file  Food Insecurity: Not on file  Transportation Needs: Not on file  Physical Activity: Not on file  Stress: Not on file  Social Connections: Not on file  Intimate Partner Violence: Not on file             Prior to Admission medications   Medication Sig Start Date End Date Taking? Authorizing Provider  B Complex Vitamins (VITAMIN-B COMPLEX PO) Take 1 tablet by mouth daily with supper.       [provider]  BIOTIN PO Take 1 tablet by mouth every evening.       [provider]  buPROPion ER (WELLBUTRIN SR) 100 MG 12 hr tablet 2  qam   1  q diner Patient taking differently: Take 100-200 mg by mouth See admin instructions. 200 mg in the morning 100 mg in the evening 12/15/20     Norma Fredrickson, MD  Calcium Carbonate-Vit D-Min (CALCIUM 1200 PO) Take 1,200 mg by mouth every evening.       [provider]  calcium elemental as carbonate (TUMS ULTRA 1000) 400 MG chewable tablet Chew 1,000 mg by mouth daily as needed for heartburn.       [provider]  docusate sodium (COLACE) 100 MG capsule Take 100 mg by mouth every evening.       [provider]  ezetimibe (ZETIA) 10 MG tablet Take 1 tablet (10 mg total) by mouth daily. Patient taking differently: Take 10 mg by mouth at bedtime. 03/31/20     Croitoru, Mihai, MD  furosemide (LASIX) 20 MG tablet Take 1 tablet (20 mg total) by mouth daily. 01/17/21     Croitoru, Mihai, MD  gabapentin (NEURONTIN) 100 MG capsule 3  qhs Patient taking differently: Take  100-200 mg by mouth See admin instructions. Take 100 mg in the morning and 200 mg  at night 12/15/20     Plovsky, Berneta Sages, MD  ketoconazole (NIZORAL) 2 % shampoo Apply 1 application topically 2 (two) times a week. Patient taking differently: Apply 1 application topically every Monday, Wednesday, and Friday. 04/26/20     Wendie Agreste, MD  latanoprost (XALATAN) 0.005 % ophthalmic solution Place 1 drop into the left eye every other day. 06/30/13     [provider]  LYCOPENE PO Take 10 mg by mouth every other day.       [provider]  Magnesium 250 MG TABS Take 250 mg by mouth every 3 (three) days.       [provider]  metFORMIN (GLUCOPHAGE) 500 MG tablet Take 1 tablet (500 mg total) by mouth 2 (two) times daily with a meal. 04/26/20     Wendie Agreste, MD  metoprolol (TOPROL-XL) 50 MG 24 hr tablet Take 1 tablet (50 mg total) by mouth 2 (two) times daily. Take with or immediately following a meal. 12/20/20     Dessa Phi, DO  Omega-3 Fatty Acids (FISH OIL) 1000 MG CAPS Take 1,000 mg by mouth daily with supper.       [provider]  rosuvastatin (CRESTOR) 20 MG tablet Take 1 tablet (20 mg total) by mouth daily. Patient taking differently: Take 20 mg by mouth at bedtime. 03/31/20     Croitoru, Mihai, MD  Sodium Fluoride (PREVIDENT 5000 DRY MOUTH DT) Place 1 application onto teeth See admin instructions. Apply to gums every night for dry mouth       [provider]  umeclidinium bromide (INCRUSE ELLIPTA) 62.5 MCG/INH AEPB Inhale 1 puff into the lungs daily. 01/14/21     Freddi Starr, MD  vitamin B-12 (CYANOCOBALAMIN) 500 MCG tablet Take 500 mcg by mouth every other day.       [provider]  vitamin C (ASCORBIC ACID) 500 MG tablet Take 500-1,000 mg by mouth See admin instructions. Alternate taking 500 mg one day and 1000 mg the next       [provider]  Vitamin D, Ergocalciferol, (DRISDOL) 1.25 MG (50000 UNIT) CAPS capsule Take 50,000 Units by mouth every Saturday.       [provider]  warfarin  (COUMADIN) 4 MG tablet TAKE 1/2 TO 1 TABLET DAILY AS DIRECTED BY COUMADIN CLINIC Patient taking differently: Take 2-4 mg by mouth See admin instructions. 2 mg on Tuesday and Friday 4 mg on Sunday,Monday,Wednesday,Thursday and saturday 07/05/20     Croitoru, Mihai, MD            Current Outpatient Medications  Medication Sig Dispense Refill   B Complex Vitamins (VITAMIN-B COMPLEX PO) Take 1 tablet by mouth daily with supper.       BIOTIN PO Take 1 tablet by mouth every evening.       buPROPion ER (WELLBUTRIN SR) 100 MG 12 hr tablet 2  qam   1  q diner (Patient taking differently: Take 100-200 mg by mouth See admin instructions. 200 mg in the morning 100 mg in the evening) 270 tablet 1   Calcium Carbonate-Vit D-Min (CALCIUM 1200 PO) Take 1,200 mg by mouth every evening.       calcium elemental as carbonate (TUMS ULTRA 1000) 400 MG chewable tablet Chew 1,000 mg by mouth daily as needed for heartburn.       docusate sodium (COLACE) 100 MG capsule  Take 100 mg by mouth every evening.       ezetimibe (ZETIA) 10 MG tablet Take 1 tablet (10 mg total) by mouth daily. (Patient taking differently: Take 10 mg by mouth at bedtime.) 90 tablet 3   furosemide (LASIX) 20 MG tablet Take 1 tablet (20 mg total) by mouth daily. 90 tablet 3   gabapentin (NEURONTIN) 100 MG capsule 3  qhs (Patient taking differently: Take 100-200 mg by mouth See admin instructions. Take 100 mg in the morning and 200 mg at night) 270 capsule 1   ketoconazole (NIZORAL) 2 % shampoo Apply 1 application topically 2 (two) times a week. (Patient taking differently: Apply 1 application topically every Monday, Wednesday, and Friday.) 120 mL 3   latanoprost (XALATAN) 0.005 % ophthalmic solution Place 1 drop into the left eye every other day.       LYCOPENE PO Take 10 mg by mouth every other day.       Magnesium 250 MG TABS Take 250 mg by mouth every 3 (three) days.       metFORMIN (GLUCOPHAGE) 500 MG tablet Take 1 tablet (500 mg total) by mouth 2  (two) times daily with a meal. 180 tablet 2   metoprolol (TOPROL-XL) 50 MG 24 hr tablet Take 1 tablet (50 mg total) by mouth 2 (two) times daily. Take with or immediately following a meal. 30 tablet 0   Omega-3 Fatty Acids (FISH OIL) 1000 MG CAPS Take 1,000 mg by mouth daily with supper.       rosuvastatin (CRESTOR) 20 MG tablet Take 1 tablet (20 mg total) by mouth daily. (Patient taking differently: Take 20 mg by mouth at bedtime.) 90 tablet 3   Sodium Fluoride (PREVIDENT 5000 DRY MOUTH DT) Place 1 application onto teeth See admin instructions. Apply to gums every night for dry mouth       umeclidinium bromide (INCRUSE ELLIPTA) 62.5 MCG/INH AEPB Inhale 1 puff into the lungs daily. 30 each 6   vitamin B-12 (CYANOCOBALAMIN) 500 MCG tablet Take 500 mcg by mouth every other day.       vitamin C (ASCORBIC ACID) 500 MG tablet Take 500-1,000 mg by mouth See admin instructions. Alternate taking 500 mg one day and 1000 mg the next       Vitamin D, Ergocalciferol, (DRISDOL) 1.25 MG (50000 UNIT) CAPS capsule Take 50,000 Units by mouth every Saturday.       warfarin (COUMADIN) 4 MG tablet TAKE 1/2 TO 1 TABLET DAILY AS DIRECTED BY COUMADIN CLINIC (Patient taking differently: Take 2-4 mg by mouth See admin instructions. 2 mg on Tuesday and Friday 4 mg on Sunday,Monday,Wednesday,Thursday and saturday) 90 tablet 1             Current Facility-Administered Medications  Medication Dose Route Frequency Provider Last Rate Last Admin   0.9 %  sodium chloride infusion  500 mL Intravenous Once Nelida Meuse III, MD               Allergies  Allergen Reactions   Other Rash      Allergen: "Plants and bushes while doing yard work"          Review of Systems:               General:                      + decreased appetite, + decreased energy, no weight gain, + weight loss, no fever  Cardiac:                       + chest pain with exertion, + chest pain at rest, +SOB with minima exertion, + resting  SOB, no PND, + orthopnea, no palpitations, + arrhythmia, + atrial fibrillation, NO LE edema, NO dizzy spells, NO syncope             Respiratory:                 + shortness of breath, NO home oxygen, NO productive cough, NO dry cough, NO bronchitis, NO wheezing, NO hemoptysis, NO asthma, + pain with inspiration or cough, NO sleep apnea, NO CPAP at night             GI:                               NO difficulty swallowing, NO reflux, NO frequent heartburn, NO hiatal hernia, NO abdominal pain, NO constipation, NO diarrhea, NO hematochezia, NO hematemesis, NO melena             GU:                              NO dysuria,  + frequency, NO urinary tract infection, NO hematuria, NO enlarged prostate, NO kidney stones, NO kidney disease             Vascular:                     NO pain suggestive of claudication, NO pain in feet, NO leg cramps, NO varicose veins, NO DVT, NO non-healing foot ulcer             Neuro:                         NO stroke, NO TIA's, NO seizures, NO headaches, NO temporary blindness one eye,  NO slurred speech, NO peripheral neuropathy, NO chronic pain, NO instability of gait, NO memory/cognitive dysfunction             Musculoskeletal:         NO arthritis - primarily involving the NO, NO joint swelling, NO myalgias, NO difficulty walking, normal mobility              Skin:                            NO rash, NO itching, NO skin infections, NO pressure sores or ulcerations             Psych:                         + anxiety, + depression, NO nervousness, NO unusual recent stress             Eyes:                           NO blurry vision, NO floaters, NO recent vision changes, NO  glasses or contacts             ENT:  NO hearing loss, NO loose or painful teeth, NO dentures, last saw dentist 09/2020             Hematologic:               NO easy bruising, NO abnormal bleeding, NO clotting disorder, NO frequent epistaxis             Endocrine:                    + diabetes, does not check CBG's at home                            Physical Exam:               BP 139/80   Pulse (!) 104   Resp 20   Ht '5\' 8"'$  (1.727 m)   Wt 201 lb (91.2 kg)   SpO2 97% Comment: RA  BMI 30.56 kg/m              General:                      Somewhat frail but well-appearing             HEENT:                       Unremarkable, NCAT, PERLA, EOMI             Neck:                           no JVD, no bruits, no adenopathy              Chest:                          clear to auscultation, symmetrical breath sounds, no wheezes, no rhonchi              CV:                              RRR, no 3/6 systolic murmur RSB. No diastolic murmur             Abdomen:                    soft, non-tender, no masses              Extremities:                 warm, well-perfused, pulses palpable at ankle, no lower extremity edema, chronic venous stasis changes of lower legs.             Rectal/GU                   Deferred             Neuro:                         Grossly non-focal and symmetrical throughout             Skin:                            Clean and dry, no rashes, no breakdown  Diagnostic Tests:   ECHOCARDIOGRAM REPORT         Patient Name:   Rodney Friedman Date of Exam: 12/17/2020  Medical Rec #:  [id number]       Height:       68.0 in  Accession #:    [id number]      Weight:       216.0 lb  Date of Birth:  04/27/1946       BSA:          2.112 m  Patient Age:    75 years        BP:           130/47 mmHg  Patient Gender: M               HR:           77 bpm.  Exam Location:  Inpatient   Procedure: 2D Echo, Cardiac Doppler and Color Doppler                       STAT ECHO  Reported to: Dr. Oval Linsey on 12/17/2020 3:31:00 AM.   Indications:    Dyspnea R06.00     History:        Patient has prior history of Echocardiogram examinations,  most                  recent 05/03/2020. CAD and Previous Myocardial Infarction,                  Arrythmias:Atrial  Fibrillation; Risk Factors:ETOH,  Diabetes,                  Dyslipidemia and Hypertension.     Sonographer:    Bernadene Person RDCS  Referring Phys: [place]      Sonographer Comments: Technically difficult study due to poor echo  windows. Image acquisition challenging due to patient body habitus. Pt  unable to turn on left side.  IMPRESSIONS     1. Left ventricular ejection fraction, by estimation, is 60 to 65%. The  left ventricle has normal function. The left ventricle has no regional  wall motion abnormalities. Left ventricular diastolic parameters are  indeterminate.   2. Right ventricular systolic function is normal. The right ventricular  size is normal. There is normal pulmonary artery systolic pressure.   3. Left atrial size was severely dilated.   4. Predominantly posterior mitral annular calcification. The mitral valve  is normal in structure. Trivial mitral valve regurgitation. No evidence of  mitral stenosis. Moderate mitral annular calcification.   5. Compared with the echo 04/2020, mean aortic valve gradient has  increased from 25 mmHg to 33 mmHg. The aortic valve is normal in  structure. There is moderate calcification of the aortic valve. There is  moderate thickening of the aortic valve. Aortic  valve regurgitation is mild. Moderate aortic valve stenosis. Aortic  regurgitation PHT measures 428 msec. Aortic valve mean gradient measures  33.5 mmHg. Aortic valve Vmax measures 3.79 m/s.   6. Aortic dilatation noted. There is mild dilatation of the ascending  aorta, measuring 37 mm.   7. The inferior vena cava is dilated in size with >50% respiratory  variability, suggesting right atrial pressure of 8 mmHg.   FINDINGS   Left Ventricle: Left ventricular ejection fraction, by estimation, is 60  to 65%. The left ventricle has normal function. The left ventricle has no  regional wall motion abnormalities. The  left ventricular internal cavity  size was  normal in size. There is   no left ventricular hypertrophy. Left ventricular diastolic parameters  are indeterminate.   Right Ventricle: The right ventricular size is normal. No increase in  right ventricular wall thickness. Right ventricular systolic function is  normal. There is normal pulmonary artery systolic pressure. The tricuspid  regurgitant velocity is 2.39 m/s, and   with an assumed right atrial pressure of 8 mmHg, the estimated right  ventricular systolic pressure is [id number] mmHg.   Left Atrium: Left atrial size was severely dilated.   Right Atrium: Right atrial size was normal in size.   Pericardium: There is no evidence of pericardial effusion.   Mitral Valve: Predominantly posterior mitral annular calcification. The  mitral valve is normal in structure. Moderate mitral annular  calcification. Trivial mitral valve regurgitation. No evidence of mitral  valve stenosis.   Tricuspid Valve: The tricuspid valve is normal in structure. Tricuspid  valve regurgitation is trivial. No evidence of tricuspid stenosis.   Aortic Valve: Compared with the echo 04/2020, mean aortic valve gradient  has increased from 25 mmHg to 33 mmHg. The aortic valve is normal in  structure. There is moderate calcification of the aortic valve. There is  moderate thickening of the aortic  valve. Aortic valve regurgitation is mild. Aortic regurgitation PHT  measures 428 msec. Moderate aortic stenosis is present. Aortic valve mean  gradient measures 33.5 mmHg. Aortic valve peak gradient measures 57.5  mmHg. Aortic valve area, by VTI measures  0.59 cm.   Pulmonic Valve: The pulmonic valve was normal in structure. Pulmonic valve  regurgitation is not visualized. No evidence of pulmonic stenosis.   Aorta: Aortic dilatation noted. There is mild dilatation of the ascending  aorta, measuring 37 mm.   Venous: The inferior vena cava is dilated in size with greater than 50%  respiratory variability,  suggesting right atrial pressure of 8 mmHg.   IAS/Shunts: No atrial level shunt detected by color flow Doppler.      LEFT VENTRICLE  PLAX 2D  LVIDd:         4.30 cm  LVIDs:         2.40 cm  LV PW:         1.40 cm  LV IVS:        1.40 cm  LVOT diam:     2.20 cm  LV SV:         58  LV SV Index:   27  LVOT Area:     3.80 cm      RIGHT VENTRICLE  TAPSE (M-mode): 1.5 cm   LEFT ATRIUM             Index       RIGHT ATRIUM           Index  LA diam:        5.30 cm 2.51 cm/m  RA Area:     19.40 cm  LA Vol (A2C):   89.0 ml 42.15 ml/m RA Volume:   51.20 ml  24.25 ml/m  LA Vol (A4C):   80.3 ml 38.03 ml/m  LA Biplane Vol: 93.9 ml 44.47 ml/m   AORTIC VALVE  AV Area (Vmax):    0.67 cm  AV Area (Vmean):   0.71 cm  AV Area (VTI):     0.59 cm  AV Vmax:           379.00 cm/s  AV Vmean:  263.500 cm/s  AV VTI:            0.979 m  AV Peak Grad:      57.5 mmHg  AV Mean Grad:      33.5 mmHg  LVOT Vmax:         66.75 cm/s  LVOT Vmean:        49.100 cm/s  LVOT VTI:          0.152 m  LVOT/AV VTI ratio: 0.16  AI PHT:            428 msec     AORTA  Ao Root diam: 3.60 cm  Ao Asc diam:  3.70 cm   TRICUSPID VALVE  TR Peak grad:   22.8 mmHg  TR Vmax:        239.00 cm/s     SHUNTS  Systemic VTI:  0.15 m  Systemic Diam: 2.20 cm   Skeet Latch MD  Electronically signed by Skeet Latch MD  Signature Date/Time: 12/17/2020/3:48:13 PM         Final       Physicians   Panel Physicians Referring Physician Case Authorizing Physician  Burnell Blanks, MD (Primary)        Procedures   RIGHT/LEFT HEART CATH AND CORONARY/GRAFT ANGIOGRAPHY    Conclusion       Prox Cx lesion is 99% stenosed.   Prox Cx to Mid Cx lesion is 100% stenosed.   Prox LAD lesion is 70% stenosed.   Mid LAD lesion is 100% stenosed.   Prox RCA lesion is 100% stenosed.   SVG graft was visualized by angiography and is large.   LIMA graft was visualized by angiography.   SVG graft was  visualized by angiography and is large.   The graft exhibits no disease.   The graft exhibits no disease.   The graft exhibits no disease.   Severe triple vessel CAD s/p 3V CABG with 3/3 patent bypass grafts 100% mid LAD occlusion. The mid and distal LAD fills from the patent LIMA graft. The Diagonal branch fills from the patent vein graft.  100% mid Circumflex occlusion. The vein graft to the OM branch is patent The RCA has chronic proximal total occlusion. The distal vessel fills from left to right collaterals through the vein graft to OM.  Severe aortic stenosis by echo (by cath the mean gradient is 32 mmhg, peak to peak gradient is 38 mmHg, AVA 1.0 cm2).    Recommendations: Will continue medical management of CAD. Continue workup for TAVR.    Indications   Severe aortic stenosis [I35.0 (ICD-10-CM)]    Procedural Details   Technical Details Indication: 75 yo male with history of CAD s/p 3V CABG, severe low flow,low gradient AS, TAVR workup  Procedure: The risks, benefits, complications, treatment options, and expected outcomes were discussed with the patient. The patient and/or family concurred with the proposed plan, giving informed consent. The patient was brought to the cath lab after IV hydration was given. The patient was sedated with Versed and Fentanyl. The IV catheter present in the right antecubital vein was changed for a 5 [place] sheath. Right heart catheterization performed with a balloon tipped catheter. The left wrist was prepped and draped in a sterile fashion. 1% lidocaine was used for local anesthesia. Using the modified Seldinger access technique, a 5 French sheath was placed in the leftt radial artery. 3 mg Verapamil was given through the sheath. 4500 units IV heparin was given. Standard diagnostic  catheters were used to perform selective coronary angiography. I engaged the LIMA graft and SVG to OM with the JR4 catheter. I engaged the SVG to Diagonal with an LCB catheter.  The aortic valve was crossed with an AL-1 and a J wire. The sheath was removed from the left radial artery and a Terumo hemostasis band was applied at the arteriotomy site on the left wrist.      Estimated blood loss <50 mL.   During this procedure medications were administered to achieve and maintain moderate conscious sedation while the patient's heart rate, blood pressure, and oxygen saturation were continuously monitored and I was present face-to-face 100% of this time.    Medications (Filter: Administrations occurring from 0828 to 0943 on 01/20/21)  important  Continuous medications are totaled by the amount administered until 01/20/21 0943.    Heparin (Porcine) in NaCl 1000-0.9 UT/500ML-% SOLN (mL) Total volume:  1,000 mL Date/Time Rate/Dose/Volume Action    01/20/21 0834 500 mL Given    0834 500 mL Given      lidocaine (PF) (XYLOCAINE) 1 % injection (mL) Total volume:  4 mL Date/Time Rate/Dose/Volume Action    01/20/21 0855 2 mL Given    0903 2 mL Given      midazolam (VERSED) injection (mg) Total dose:  1 mg Date/Time Rate/Dose/Volume Action    01/20/21 0856 1 mg Given      fentaNYL (SUBLIMAZE) injection (mcg) Total dose:  25 mcg Date/Time Rate/Dose/Volume Action    01/20/21 0856 25 mcg Given      Radial Cocktail/Verapamil only (mL) Total volume:  10 mL Date/Time Rate/Dose/Volume Action    01/20/21 Canceled Entry    0904 10 mL Given      heparin sodium (porcine) injection (Units) Total dose:  4,500 Units Date/Time Rate/Dose/Volume Action    01/20/21 0909 4,500 Units Given      iohexol (OMNIPAQUE) 350 MG/ML injection (mL) Total volume:  90 mL Date/Time Rate/Dose/Volume Action    01/20/21 0934 90 mL Given      Sedation Time   Sedation Time Physician-1: 35 minutes 47 seconds Contrast   Medication Name Total Dose  iohexol (OMNIPAQUE) 350 MG/ML injection 90 mL    Radiation/Fluoro   Fluoro time: 13.7 (min) DAP: 48459 (mGycm2) Cumulative Air Kerma: 765  (mGy) Coronary Findings   Diagnostic Dominance: Right Left Anterior Descending  Vessel is large.  Prox LAD lesion is 70% stenosed.  Mid LAD lesion is 100% stenosed. The lesion is chronically occluded.  Left Circumflex  Vessel is large.  Prox Cx lesion is 99% stenosed.  Prox Cx to Mid Cx lesion is 100% stenosed. The lesion is chronically occluded.  Right Coronary Artery  Vessel is large.  Prox RCA lesion is 100% stenosed. The lesion is chronically occluded.  Third Right Posterolateral Branch  Collaterals  3rd RPL filled by collaterals from 3rd Mrg.     Saphenous Graft To 2nd Mrg  SVG graft was visualized by angiography and is large. The graft exhibits no disease.  LIMA LIMA Graft To Dist LAD  LIMA graft was visualized by angiography. The graft exhibits no disease.  Saphenous Graft To 2nd Diag  SVG graft was visualized by angiography and is large. The graft exhibits no disease.  Intervention   No interventions have been documented. Coronary Diagrams   Diagnostic Dominance: Right Intervention   Implants      No implant documentation for this case.    Syngo Images    Show images for CARDIAC CATHETERIZATION  Images on Long Term Storage    Show images for [place], [place] to Procedure Log   Procedure Log    Hemo Data   Flowsheet Row Most Recent Value  Fick Cardiac Output 4.28 L/min  Fick Cardiac Output Index 2.05 (L/min)/BSA  Aortic Mean Gradient 32.3 mmHg  Aortic Peak Gradient 38 mmHg  Aortic Valve Area 1.04  Aortic Value Area Index 0.5 cm2/BSA  RA A Wave 5 mmHg  RA V Wave 9 mmHg  RA Mean 6 mmHg  RV Systolic Pressure 47 mmHg  RV Diastolic Pressure 3 mmHg  RV EDP 6 mmHg  PA Systolic Pressure 52 mmHg  PA Diastolic Pressure 20 mmHg  PA Mean 34 mmHg  PW A Wave 24 mmHg  PW V Wave 29 mmHg  PW Mean 23 mmHg  AO Systolic Pressure A999333 mmHg  AO Diastolic Pressure 59 mmHg  AO Mean 81 mmHg  LV Systolic Pressure 123456 mmHg  LV Diastolic Pressure 7 mmHg  LV EDP  20 mmHg  AOp Systolic Pressure 123XX123 mmHg  AOp Diastolic Pressure 50 mmHg  AOp Mean Pressure 79 mmHg  LVp Systolic Pressure 0000000 mmHg  LVp Diastolic Pressure 6 mmHg  LVp EDP Pressure 22 mmHg  QP/QS 1  TPVR Index 16.58 HRUI  TSVR Index 39.51 HRUI  PVR SVR Ratio 0.15  TPVR/TSVR Ratio 0.42      Narrative & Impression  CLINICAL DATA:  75 year old male with history of severe aortic stenosis. Preprocedural study prior to potential transcatheter aortic valve replacement (TAVR) procedure.   EXAM: CT ANGIOGRAPHY CHEST, ABDOMEN AND PELVIS   TECHNIQUE: Multidetector CT imaging through the chest, abdomen and pelvis was performed using the standard protocol during bolus administration of intravenous contrast. Multiplanar reconstructed images and MIPs were obtained and reviewed to evaluate the vascular anatomy.   CONTRAST:  1m OMNIPAQUE IOHEXOL 350 MG/ML SOLN   COMPARISON:  Chest CTA 12/17/2020. No prior CT the abdomen or pelvis.   FINDINGS: CTA CHEST FINDINGS   Cardiovascular: Heart size is enlarged with left atrial dilatation. There is no significant pericardial fluid, thickening or pericardial calcification. There is aortic atherosclerosis, as well as atherosclerosis of the great vessels of the mediastinum and the coronary arteries, including calcified atherosclerotic plaque in the left main, left anterior descending, left circumflex and right coronary arteries. Status post median sternotomy for CABG including LIMA to the LAD. Severe thickening and calcification of the aortic valve. Severe calcifications of the mitral annulus.   Mediastinum/Lymph Nodes: No pathologically enlarged mediastinal or hilar lymph nodes. Esophagus is unremarkable in appearance. No axillary lymphadenopathy.   Lungs/Pleura: Small bilateral pleural effusions lying dependently. No acute consolidative airspace disease. No suspicious appearing pulmonary nodules or masses are noted.   Musculoskeletal/Soft  Tissues: Median sternotomy wires. There are no aggressive appearing lytic or blastic lesions noted in the visualized portions of the skeleton.   CTA ABDOMEN AND PELVIS FINDINGS   Hepatobiliary: No suspicious cystic or solid hepatic lesions. No intra or extrahepatic biliary ductal dilatation. Gallbladder is normal in appearance.   Pancreas: No pancreatic mass. No pancreatic ductal dilatation. No pancreatic or peripancreatic fluid collections or inflammatory changes.   Spleen: Unremarkable.   Adrenals/Urinary Tract: Several low-attenuation lesions in both kidneys compatible with simple cysts measuring up to 1.6 cm in diameter in the lower pole of the right kidney and 1.8 cm in diameter in the lower pole the left kidney. Other subcentimeter low-attenuation lesions are also noted in both kidneys, too small to characterize, but statistically likely to  represent tiny cysts. No hydroureteronephrosis. Urinary bladder is normal in appearance. Bilateral adrenal glands are normal in appearance.   Stomach/Bowel: The appearance of the stomach is normal. No pathologic dilatation of small bowel or colon. Numerous colonic diverticulae are noted, without surrounding inflammatory changes to suggest an acute diverticulitis at this time. Normal appendix.   Vascular/Lymphatic: Aortic atherosclerosis with vascular findings and measurements pertinent to potential TAVR procedure, as detailed below. 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 No lymphadenopathy noted in the abdomen or pelvis.   Reproductive: Prostate gland and seminal vesicles are unremarkable in appearance.   Other: No significant volume of ascites.  No pneumoperitoneum.   Musculoskeletal: There are no aggressive appearing lytic or blastic lesions noted in the visualized portions of the skeleton.   VASCULAR MEASUREMENTS PERTINENT TO TAVR:   AORTA:   Minimal Aortic  Diameter-16 x 11 mm   Severity of Aortic Calcification-severe   RIGHT PELVIS:   Right Common Iliac Artery -   Minimal Diameter-7.7 x 6.0 mm   Tortuosity-mild   Calcification-severe   Right External Iliac Artery -   Minimal Diameter-6.1 x 4.3 mm   Tortuosity - mild   Calcification - severe   Right Common Femoral Artery -   Minimal Diameter-5.8 x 3.6 mm   Tortuosity - mild   Calcification - severe   LEFT PELVIS:   Left Common Iliac Artery -   Minimal Diameter-7.4 x 6.7 mm   Tortuosity - mild   Calcification - severe   Left External Iliac Artery -   Minimal Diameter-5.3 x 4.3 mm   Tortuosity - mild   Calcification - severe   Left Common Femoral Artery -   Minimal Diameter-5.7 x 4.5 mm   Tortuosity - mild   Calcification - severe   Review of the MIP images confirms the above findings.   IMPRESSION: 1. Vascular findings and measurements pertinent to potential TAVR procedure, as detailed above. 2. Severe thickening calcification of the aortic valve, compatible with reported clinical history of severe aortic stenosis. 3. Aortic atherosclerosis, in addition to left main and 3 vessel coronary artery disease. Status post median sternotomy for CABG including LIMA to the LAD. 4. Small bilateral pleural effusions lying dependently. 5. Colonic diverticulosis without evidence of acute diverticulitis at this time. 6. Additional incidental findings, as above.     Electronically Signed   By: Vinnie Langton M.D.   On: 12/30/2020 15:05      STS Risk Score: Risk of Mortality: 1.363% Renal Failure: 1.810% Permanent Stroke: 0.801% Prolonged Ventilation: 8.281% DSW Infection: 0.234% Reoperation: 3.088% Morbidity or Mortality: 11.386% Short Length of Stay: 40.334% Long Length of Stay: 5.348%   Impression:   This 75 year old gentleman has stage D, severe, symptomatic aortic stenosis with New York Heart Association class III symptoms of  exertional fatigue and shortness of breath as well as some symptoms at rest and orthopnea consistent with chronic diastolic congestive heart failure.  I have personally reviewed his 2D echocardiogram, cardiac catheterization, and CTA studies.  His echocardiogram shows a calcified and thickened aortic valve with restricted leaflet mobility.  The mean gradient is 33.5 mmHg with an aortic valve area of 0.59 cm consistent with severe aortic stenosis.  There is mild aortic insufficiency.  Left ventricular ejection fraction is 60 to 65%.  Cardiac catheterization shows severe native three-vessel coronary disease with 3 out of 3 patent bypass grafts.  I agree that aortic valve replacement is  indicated in this patient for relief of his symptoms and to prevent progressive left ventricular deterioration.  He has had 3 admissions over the past few months for congestive heart failure.  Given his age and prior coronary bypass surgery I think that transcatheter aortic valve replacement is the best option for him.  His gated cardiac CTA shows anatomy suitable for TAVR using a 26 mm Edwards SAPIEN 3 valve.  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  Some of this calcification is concentric and I think transfemoral insertion would have significant risk of vascular complications.  His left common carotid artery and left subclavian artery appear to be of adequate size with no significant disease.  Since he has a patent left internal mammary graft I think the best option is to use his left common carotid artery.  I discussed that with him.   The patient was counseled at length regarding treatment alternatives for management of severe symptomatic aortic stenosis. The risks and benefits of surgical intervention has been discussed in detail. Long-term prognosis with medical therapy was discussed. Alternative approaches such as conventional surgical aortic valve replacement, transcatheter  aortic valve replacement, and palliative medical therapy were compared and contrasted at length. This discussion was placed in the context of the patient's own specific clinical presentation and past medical history. All of his questions have been addressed.    Following the decision to proceed with transcatheter aortic valve replacement, a discussion was held regarding what types of management strategies would be attempted intraoperatively in the event of life-threatening complications, including whether or not the patient would be considered a candidate for the use of cardiopulmonary bypass and/or conversion to open sternotomy for attempted surgical intervention. Given his prior coronary bypass surgery, age, and frailty I do not think he would be a candidate for emergent sternotomy to manage any intraoperative complications.  The patient is aware of the fact that transient use of cardiopulmonary bypass may be necessary. The patient has been advised of a variety of complications that might develop including but not limited to risks of death, stroke, paravalvular leak, aortic dissection or other major vascular complications, aortic annulus rupture, device embolization, cardiac rupture or perforation, mitral regurgitation, acute myocardial infarction, arrhythmia, heart block or bradycardia requiring permanent pacemaker placement, congestive heart failure, respiratory failure, renal failure, pneumonia, infection, other late complications related to structural valve deterioration or migration, or other complications that might ultimately cause a temporary or permanent loss of functional independence or other long term morbidity. The patient provides full informed consent for the procedure as described and all questions were answered.       Plan:   Transcatheter aortic valve replacement using a SAPIEN 3 valve with left common carotid artery access.       Gaye Pollack, MD

## 2021-02-07 NOTE — Anesthesia Preprocedure Evaluation (Addendum)
Anesthesia Evaluation  Patient identified by MRN, date of birth, ID band  Reviewed: Allergy & Precautions, NPO status , Patient's Chart, lab work & pertinent test results  Airway Mallampati: II  TM Distance: >3 FB     Dental   Pulmonary neg shortness of breath, sleep apnea , pneumonia, former smoker,    breath sounds clear to auscultation       Cardiovascular hypertension, + angina + CAD, + Past MI and +CHF  + dysrhythmias + Valvular Problems/Murmurs AS  Rhythm:Regular Rate:Normal + Systolic murmurs    Neuro/Psych Anxiety Depression    GI/Hepatic negative GI ROS, Neg liver ROS,   Endo/Other  diabetes  Renal/GU negative Renal ROS     Musculoskeletal   Abdominal   Peds  Hematology   Anesthesia Other Findings   Reproductive/Obstetrics                          Anesthesia Physical Anesthesia Plan  ASA: 3  Anesthesia Plan: General   Post-op Pain Management:    Induction: Intravenous  PONV Risk Score and Plan: 2 and Treatment may vary due to age or medical condition and Ondansetron  Airway Management Planned: Oral ETT  Additional Equipment:   Intra-op Plan:   Post-operative Plan: Possible Post-op intubation/ventilation  Informed Consent: I have reviewed the patients History and Physical, chart, labs and discussed the procedure including the risks, benefits and alternatives for the proposed anesthesia with the patient or authorized representative who has indicated his/her understanding and acceptance.     Dental advisory given  Plan Discussed with: Anesthesiologist and CRNA  Anesthesia Plan Comments: (PAT note written 02/07/2021 by Myra Gianotti, PA-C. )      Anesthesia Quick Evaluation

## 2021-02-08 ENCOUNTER — Encounter (HOSPITAL_COMMUNITY)
Admission: RE | Disposition: A | Discharge status: Home / Self Care | Payer: Self-pay | Source: Home / Self Care | Attending: Surgery

## 2021-02-08 ENCOUNTER — Inpatient Hospital Stay (HOSPITAL_COMMUNITY): Payer: Medicare Other | Admitting: Anesthesiology

## 2021-02-08 ENCOUNTER — Encounter (HOSPITAL_COMMUNITY): Payer: Self-pay | Admitting: Cardiovascular Disease

## 2021-02-08 ENCOUNTER — Other Ambulatory Visit: Payer: Self-pay

## 2021-02-08 ENCOUNTER — Encounter: Payer: Self-pay | Admitting: Physician Assistant

## 2021-02-08 ENCOUNTER — Other Ambulatory Visit: Payer: Self-pay | Admitting: Physician Assistant

## 2021-02-08 ENCOUNTER — Inpatient Hospital Stay (HOSPITAL_COMMUNITY): Payer: Medicare Other

## 2021-02-08 ENCOUNTER — Inpatient Hospital Stay (HOSPITAL_COMMUNITY): Payer: Medicare Other | Admitting: Vascular Surgery

## 2021-02-08 ENCOUNTER — Inpatient Hospital Stay (HOSPITAL_COMMUNITY)
Admission: RE | Admit: 2021-02-08 | Discharge: 2021-02-09 | DRG: 267 | Disposition: A | Discharge status: Home / Self Care | Payer: Medicare Other | Attending: Surgery | Admitting: Surgery

## 2021-02-08 DIAGNOSIS — I5032 Chronic diastolic (congestive) heart failure: Secondary | ICD-10-CM | POA: Diagnosis not present

## 2021-02-08 DIAGNOSIS — Z8042 Family history of malignant neoplasm of prostate: Secondary | ICD-10-CM | POA: Diagnosis not present

## 2021-02-08 DIAGNOSIS — I352 Nonrheumatic aortic (valve) stenosis with insufficiency: Secondary | ICD-10-CM | POA: Diagnosis not present

## 2021-02-08 DIAGNOSIS — C9110 Chronic lymphocytic leukemia of B-cell type not having achieved remission: Secondary | ICD-10-CM | POA: Diagnosis not present

## 2021-02-08 DIAGNOSIS — Z91048 Other nonmedicinal substance allergy status: Secondary | ICD-10-CM

## 2021-02-08 DIAGNOSIS — Z87891 Personal history of nicotine dependence: Secondary | ICD-10-CM | POA: Diagnosis not present

## 2021-02-08 DIAGNOSIS — Z952 Presence of prosthetic heart valve: Secondary | ICD-10-CM

## 2021-02-08 DIAGNOSIS — I252 Old myocardial infarction: Secondary | ICD-10-CM | POA: Diagnosis not present

## 2021-02-08 DIAGNOSIS — G4733 Obstructive sleep apnea (adult) (pediatric): Secondary | ICD-10-CM | POA: Diagnosis present

## 2021-02-08 DIAGNOSIS — F419 Anxiety disorder, unspecified: Secondary | ICD-10-CM | POA: Diagnosis present

## 2021-02-08 DIAGNOSIS — I340 Nonrheumatic mitral (valve) insufficiency: Secondary | ICD-10-CM | POA: Diagnosis present

## 2021-02-08 DIAGNOSIS — E119 Type 2 diabetes mellitus without complications: Secondary | ICD-10-CM | POA: Diagnosis not present

## 2021-02-08 DIAGNOSIS — Z7984 Long term (current) use of oral hypoglycemic drugs: Secondary | ICD-10-CM

## 2021-02-08 DIAGNOSIS — Z7901 Long term (current) use of anticoagulants: Secondary | ICD-10-CM | POA: Diagnosis not present

## 2021-02-08 DIAGNOSIS — I482 Chronic atrial fibrillation, unspecified: Secondary | ICD-10-CM | POA: Diagnosis present

## 2021-02-08 DIAGNOSIS — I350 Nonrheumatic aortic (valve) stenosis: Secondary | ICD-10-CM

## 2021-02-08 DIAGNOSIS — F32A Depression, unspecified: Secondary | ICD-10-CM | POA: Diagnosis present

## 2021-02-08 DIAGNOSIS — I083 Combined rheumatic disorders of mitral, aortic and tricuspid valves: Secondary | ICD-10-CM | POA: Diagnosis not present

## 2021-02-08 DIAGNOSIS — H409 Unspecified glaucoma: Secondary | ICD-10-CM | POA: Diagnosis present

## 2021-02-08 DIAGNOSIS — Z951 Presence of aortocoronary bypass graft: Secondary | ICD-10-CM

## 2021-02-08 DIAGNOSIS — I25118 Atherosclerotic heart disease of native coronary artery with other forms of angina pectoris: Secondary | ICD-10-CM | POA: Diagnosis present

## 2021-02-08 DIAGNOSIS — I110 Hypertensive heart disease with heart failure: Secondary | ICD-10-CM | POA: Diagnosis not present

## 2021-02-08 DIAGNOSIS — Z6830 Body mass index (BMI) 30.0-30.9, adult: Secondary | ICD-10-CM

## 2021-02-08 DIAGNOSIS — Z954 Presence of other heart-valve replacement: Secondary | ICD-10-CM | POA: Diagnosis not present

## 2021-02-08 DIAGNOSIS — I480 Paroxysmal atrial fibrillation: Secondary | ICD-10-CM | POA: Diagnosis not present

## 2021-02-08 DIAGNOSIS — Z79899 Other long term (current) drug therapy: Secondary | ICD-10-CM | POA: Diagnosis not present

## 2021-02-08 DIAGNOSIS — C61 Malignant neoplasm of prostate: Secondary | ICD-10-CM | POA: Diagnosis not present

## 2021-02-08 DIAGNOSIS — I10 Essential (primary) hypertension: Secondary | ICD-10-CM | POA: Diagnosis present

## 2021-02-08 DIAGNOSIS — E1169 Type 2 diabetes mellitus with other specified complication: Secondary | ICD-10-CM | POA: Diagnosis present

## 2021-02-08 DIAGNOSIS — E782 Mixed hyperlipidemia: Secondary | ICD-10-CM | POA: Diagnosis present

## 2021-02-08 DIAGNOSIS — E669 Obesity, unspecified: Secondary | ICD-10-CM | POA: Diagnosis present

## 2021-02-08 DIAGNOSIS — Z006 Encounter for examination for normal comparison and control in clinical research program: Secondary | ICD-10-CM

## 2021-02-08 HISTORY — PX: TRANSCATHETER AORTIC VALVE REPLACEMENT, CAROTID: SHX6798

## 2021-02-08 HISTORY — DX: Presence of prosthetic heart valve: Z95.2

## 2021-02-08 HISTORY — PX: TEE WITHOUT CARDIOVERSION: SHX5443

## 2021-02-08 LAB — ECHO TEE
AR max vel: 2.59 cm2
AV Area VTI: 2.44 cm2
AV Area mean vel: 2.87 cm2
AV Mean grad: 2.5 mmHg
AV Peak grad: 4.9 mmHg
Ao pk vel: 1.11 m/s
MV M vel: 4.92 m/s
MV Peak grad: 96.8 mmHg
MV VTI: 1.29 cm2
Radius: 0.9 cm

## 2021-02-08 LAB — POCT I-STAT, CHEM 8
BUN: 19 mg/dL (ref 8–23)
BUN: 20 mg/dL (ref 8–23)
Calcium, Ion: 1.26 mmol/L (ref 1.15–1.40)
Calcium, Ion: 1.25 mmol/L (ref 1.15–1.40)
Chloride: 109 mmol/L (ref 98–111)
Chloride: 108 mmol/L (ref 98–111)
Creatinine, Ser: 1.2 mg/dL (ref 0.61–1.24)
Creatinine, Ser: 1.2 mg/dL (ref 0.61–1.24)
Glucose, Bld: 100 mg/dL — ABNORMAL HIGH (ref 70–99)
Glucose, Bld: 154 mg/dL — ABNORMAL HIGH (ref 70–99)
HCT: 35 % — ABNORMAL LOW (ref 39.0–52.0)
HCT: 36 % — ABNORMAL LOW (ref 39.0–52.0)
Hemoglobin: 11.9 g/dL — ABNORMAL LOW (ref 13.0–17.0)
Hemoglobin: 12.2 g/dL — ABNORMAL LOW (ref 13.0–17.0)
Potassium: 3.8 mmol/L (ref 3.5–5.1)
Potassium: 4.2 mmol/L (ref 3.5–5.1)
Sodium: 143 mmol/L (ref 135–145)
Sodium: 142 mmol/L (ref 135–145)
TCO2: 22 mmol/L (ref 22–32)
TCO2: 22 mmol/L (ref 22–32)

## 2021-02-08 LAB — GLUCOSE, CAPILLARY
Glucose-Capillary: 120 mg/dL — ABNORMAL HIGH (ref 70–99)
Glucose-Capillary: 103 mg/dL — ABNORMAL HIGH (ref 70–99)
Glucose-Capillary: 125 mg/dL — ABNORMAL HIGH (ref 70–99)
Glucose-Capillary: 133 mg/dL — ABNORMAL HIGH (ref 70–99)
Glucose-Capillary: 174 mg/dL — ABNORMAL HIGH (ref 70–99)

## 2021-02-08 LAB — PROTIME-INR
INR: 1.4 — ABNORMAL HIGH (ref 0.8–1.2)
Prothrombin Time: 16.7 seconds — ABNORMAL HIGH (ref 11.4–15.2)

## 2021-02-08 LAB — POCT ACTIVATED CLOTTING TIME: Activated Clotting Time: 378 seconds

## 2021-02-08 LAB — ABO/RH: ABO/RH(D): A POS

## 2021-02-08 SURGERY — IMPLANTATION, AORTIC VALVE, TRANSCATHETER, CAROTID ARTERY APPROACH
Anesthesia: General

## 2021-02-08 MED ORDER — EZETIMIBE 10 MG PO TABS
10.0000 mg | ORAL_TABLET | Freq: Every day | ORAL | Status: DC
  Administered 2021-02-09: 10 mg via ORAL
  Filled 2021-02-08: qty 1

## 2021-02-08 MED ORDER — ROSUVASTATIN CALCIUM 20 MG PO TABS
20.0000 mg | ORAL_TABLET | Freq: Every day | ORAL | Status: DC
  Administered 2021-02-08: 20 mg via ORAL
  Filled 2021-02-08: qty 1

## 2021-02-08 MED ORDER — GABAPENTIN 100 MG PO CAPS
200.0000 mg | ORAL_CAPSULE | Freq: Every day | ORAL | Status: DC
  Administered 2021-02-08: 200 mg via ORAL
  Filled 2021-02-08: qty 2

## 2021-02-08 MED ORDER — ONDANSETRON HCL 4 MG/2ML IJ SOLN: 4.0000 mg | Freq: Four times a day (QID) | INTRAMUSCULAR | Status: DC | PRN

## 2021-02-08 MED ORDER — FENTANYL CITRATE (PF) 100 MCG/2ML IJ SOLN
INTRAMUSCULAR | Status: AC
  Filled 2021-02-08: qty 2

## 2021-02-08 MED ORDER — INSULIN ASPART 100 UNIT/ML IJ SOLN
0.0000 [IU] | Freq: Three times a day (TID) | INTRAMUSCULAR | Status: DC
  Administered 2021-02-08: 2 [IU] via SUBCUTANEOUS
  Administered 2021-02-08: 4 [IU] via SUBCUTANEOUS

## 2021-02-08 MED ORDER — PROPOFOL 10 MG/ML IV BOLUS
INTRAVENOUS | Status: DC | PRN
  Administered 2021-02-08: 100 mg via INTRAVENOUS

## 2021-02-08 MED ORDER — LIDOCAINE 2% (20 MG/ML) 5 ML SYRINGE
INTRAMUSCULAR | Status: DC | PRN
  Administered 2021-02-08: 60 mg via INTRAVENOUS

## 2021-02-08 MED ORDER — LACTATED RINGERS IV SOLN: INTRAVENOUS | Status: DC

## 2021-02-08 MED ORDER — CHLORHEXIDINE GLUCONATE 0.12 % MT SOLN
15.0000 mL | Freq: Once | OROMUCOSAL | Status: AC
  Administered 2021-02-08: 15 mL via OROMUCOSAL
  Filled 2021-02-08 (×2): qty 15

## 2021-02-08 MED ORDER — NITROGLYCERIN IN D5W 200-5 MCG/ML-% IV SOLN: 0.0000 ug/min | INTRAVENOUS | Status: DC

## 2021-02-08 MED ORDER — SODIUM CHLORIDE 0.9 % IV SOLN: INTRAVENOUS | Status: DC

## 2021-02-08 MED ORDER — PHENYLEPHRINE 40 MCG/ML (10ML) SYRINGE FOR IV PUSH (FOR BLOOD PRESSURE SUPPORT)
PREFILLED_SYRINGE | INTRAVENOUS | Status: DC | PRN
  Administered 2021-02-08: 80 ug via INTRAVENOUS

## 2021-02-08 MED ORDER — CHLORHEXIDINE GLUCONATE 4 % EX LIQD
60.0000 mL | Freq: Once | CUTANEOUS | Status: DC
  Filled 2021-02-08: qty 60

## 2021-02-08 MED ORDER — PHENYLEPHRINE HCL-NACL 20-0.9 MG/250ML-% IV SOLN: 0.0000 ug/min | INTRAVENOUS | Status: DC

## 2021-02-08 MED ORDER — TRAMADOL HCL 50 MG PO TABS: 50.0000 mg | ORAL_TABLET | ORAL | Status: DC | PRN

## 2021-02-08 MED ORDER — PROTAMINE SULFATE 10 MG/ML IV SOLN
INTRAVENOUS | Status: DC | PRN
  Administered 2021-02-08: 130 mg via INTRAVENOUS

## 2021-02-08 MED ORDER — CEFAZOLIN SODIUM-DEXTROSE 2-4 GM/100ML-% IV SOLN
2.0000 g | Freq: Three times a day (TID) | INTRAVENOUS | Status: AC
  Administered 2021-02-08 (×2): 2 g via INTRAVENOUS
  Filled 2021-02-08 (×2): qty 100

## 2021-02-08 MED ORDER — CHLORHEXIDINE GLUCONATE 4 % EX LIQD
30.0000 mL | CUTANEOUS | Status: DC
  Filled 2021-02-08: qty 30

## 2021-02-08 MED ORDER — GABAPENTIN 100 MG PO CAPS
100.0000 mg | ORAL_CAPSULE | Freq: Every day | ORAL | Status: DC
  Administered 2021-02-09: 100 mg via ORAL
  Filled 2021-02-08: qty 1

## 2021-02-08 MED ORDER — ACETAMINOPHEN 650 MG RE SUPP: 650.0000 mg | Freq: Four times a day (QID) | RECTAL | Status: DC | PRN

## 2021-02-08 MED ORDER — SODIUM CHLORIDE 0.9 % IV SOLN: INTRAVENOUS | Status: AC

## 2021-02-08 MED ORDER — MORPHINE SULFATE (PF) 2 MG/ML IV SOLN: 1.0000 mg | INTRAVENOUS | Status: DC | PRN

## 2021-02-08 MED ORDER — SODIUM CHLORIDE 0.9 % IV SOLN: 250.0000 mL | INTRAVENOUS | Status: DC

## 2021-02-08 MED ORDER — SODIUM CHLORIDE 0.9 % IV SOLN: 250.0000 mL | INTRAVENOUS | Status: DC | PRN

## 2021-02-08 MED ORDER — DEXAMETHASONE SODIUM PHOSPHATE 10 MG/ML IJ SOLN
INTRAMUSCULAR | Status: DC | PRN
  Administered 2021-02-08: 4 mg via INTRAVENOUS

## 2021-02-08 MED ORDER — ASPIRIN 81 MG PO CHEW
81.0000 mg | CHEWABLE_TABLET | Freq: Every day | ORAL | Status: DC
  Administered 2021-02-09: 81 mg via ORAL
  Filled 2021-02-08: qty 1

## 2021-02-08 MED ORDER — ACETAMINOPHEN 325 MG PO TABS
650.0000 mg | ORAL_TABLET | Freq: Four times a day (QID) | ORAL | Status: DC | PRN
  Administered 2021-02-08: 650 mg via ORAL
  Filled 2021-02-08: qty 2

## 2021-02-08 MED ORDER — SUCCINYLCHOLINE CHLORIDE 200 MG/10ML IV SOSY
PREFILLED_SYRINGE | INTRAVENOUS | Status: DC | PRN
  Administered 2021-02-08: 120 mg via INTRAVENOUS

## 2021-02-08 MED ORDER — ONDANSETRON HCL 4 MG/2ML IJ SOLN
INTRAMUSCULAR | Status: DC | PRN
  Administered 2021-02-08: 4 mg via INTRAVENOUS

## 2021-02-08 MED ORDER — ROCURONIUM BROMIDE 10 MG/ML (PF) SYRINGE
PREFILLED_SYRINGE | INTRAVENOUS | Status: DC | PRN
  Administered 2021-02-08: 60 mg via INTRAVENOUS

## 2021-02-08 MED ORDER — BUPROPION HCL ER (SR) 100 MG PO TB12
200.0000 mg | ORAL_TABLET | Freq: Every day | ORAL | Status: DC
  Administered 2021-02-09: 200 mg via ORAL
  Filled 2021-02-08: qty 2

## 2021-02-08 MED ORDER — GABAPENTIN 100 MG PO CAPS: 100.0000 mg | ORAL_CAPSULE | ORAL | Status: DC

## 2021-02-08 MED ORDER — FENTANYL CITRATE (PF) 250 MCG/5ML IJ SOLN
INTRAMUSCULAR | Status: DC | PRN
  Administered 2021-02-08: 100 ug via INTRAVENOUS

## 2021-02-08 MED ORDER — ALBUTEROL SULFATE HFA 108 (90 BASE) MCG/ACT IN AERS
INHALATION_SPRAY | RESPIRATORY_TRACT | Status: DC | PRN
  Administered 2021-02-08: 6 via RESPIRATORY_TRACT

## 2021-02-08 MED ORDER — BUPROPION HCL ER (SR) 100 MG PO TB12
100.0000 mg | ORAL_TABLET | Freq: Every day | ORAL | Status: DC
  Administered 2021-02-08: 100 mg via ORAL
  Filled 2021-02-08 (×3): qty 1

## 2021-02-08 MED ORDER — SODIUM CHLORIDE 0.9% FLUSH: 3.0000 mL | INTRAVENOUS | Status: DC | PRN

## 2021-02-08 MED ORDER — BUPROPION HCL ER (SR) 100 MG PO TB12: 100.0000 mg | ORAL_TABLET | ORAL | Status: DC

## 2021-02-08 MED ORDER — HEPARIN (PORCINE) IN NACL 1000-0.9 UT/500ML-% IV SOLN
INTRAVENOUS | Status: AC
  Filled 2021-02-08: qty 1500

## 2021-02-08 MED ORDER — SUGAMMADEX SODIUM 200 MG/2ML IV SOLN
INTRAVENOUS | Status: DC | PRN
  Administered 2021-02-08: 200 mg via INTRAVENOUS

## 2021-02-08 MED ORDER — HEPARIN SODIUM (PORCINE) 1000 UNIT/ML IJ SOLN
INTRAMUSCULAR | Status: DC | PRN
  Administered 2021-02-08: 13000 [IU] via INTRAVENOUS

## 2021-02-08 MED ORDER — OXYCODONE HCL 5 MG PO TABS: 5.0000 mg | ORAL_TABLET | ORAL | Status: DC | PRN

## 2021-02-08 MED ORDER — SODIUM CHLORIDE 0.9% FLUSH
3.0000 mL | Freq: Two times a day (BID) | INTRAVENOUS | Status: DC
  Administered 2021-02-08 – 2021-02-09 (×2): 3 mL via INTRAVENOUS

## 2021-02-08 MED ORDER — LIDOCAINE HCL (PF) 1 % IJ SOLN
INTRAMUSCULAR | Status: DC | PRN
  Administered 2021-02-08: 5 mL via SUBCUTANEOUS

## 2021-02-08 SURGICAL SUPPLY — 31 items
BAG SNAP BAND KOVER 36X36 (MISCELLANEOUS) ×2 IMPLANT
CABLE ADAPT PACING TEMP 12FT (ADAPTER) ×1 IMPLANT
CATH 26 ULTRA DELIVERY (CATHETERS) ×1 IMPLANT
CATH DIAG 6FR PIGTAIL ANGLED (CATHETERS) ×2 IMPLANT
CATH INFINITI 6F AL2 (CATHETERS) ×1 IMPLANT
CATH S G BIP PACING (CATHETERS) ×1 IMPLANT
CLOSURE MYNX CONTROL 6F/7F (Vascular Products) ×1 IMPLANT
CRIMPER (MISCELLANEOUS) ×1 IMPLANT
DEVICE INFLATION ATRION QL2530 (MISCELLANEOUS) ×1 IMPLANT
ELECT DEFIB PAD ADLT CADENCE (PAD) ×1 IMPLANT
GUIDEWIRE INQWIRE 1.5J.035X260 (WIRE) IMPLANT
GUIDEWIRE SAFE TJ AMPLATZ EXST (WIRE) ×1 IMPLANT
INQWIRE 1.5J .035X260CM (WIRE) ×3
KIT HEART LEFT (KITS) ×1 IMPLANT
KIT MICROPUNCTURE NIT STIFF (SHEATH) ×1 IMPLANT
SHEATH BRITE TIP 7FR 35CM (SHEATH) ×1 IMPLANT
SHEATH EDWARDS INTRO SET 14X36 (SHEATH) ×1 IMPLANT
SHEATH PINNACLE 6F 10CM (SHEATH) ×1 IMPLANT
SHEATH PINNACLE 8F 10CM (SHEATH) ×1 IMPLANT
SHEATH PROBE COVER 6X72 (BAG) ×1 IMPLANT
SLEEVE REPOSITIONING LENGTH 30 (MISCELLANEOUS) ×1 IMPLANT
STOPCOCK MORSE 400PSI 3WAY (MISCELLANEOUS) ×3 IMPLANT
SYR MEDRAD MARK V 150ML (SYRINGE) ×1 IMPLANT
TRANSDUCER W/STOPCOCK (MISCELLANEOUS) ×2 IMPLANT
TUBING ART PRESS 72  MALE/FEM (TUBING) ×1
TUBING ART PRESS 72 MALE/FEM (TUBING) IMPLANT
TUBING CIL FLEX 10 FLL-RA (TUBING) ×1 IMPLANT
TUBING CONTRAST HIGH PRESS 48 (TUBING) ×1 IMPLANT
VALVE 26 ULTRA SAPIEN KIT (Valve) ×1 IMPLANT
WIRE EMERALD 3MM-J .035X150CM (WIRE) ×1 IMPLANT
WIRE EMERALD ST .035X260CM (WIRE) ×1 IMPLANT

## 2021-02-08 NOTE — Discharge Instructions (Signed)
ACTIVITY AND EXERCISE °• Daily activity and exercise are an important part of your recovery. People recover at different rates depending on their general health and type of valve procedure. °• Most people recovering from TAVR feel better relatively quickly  °• No lifting, pushing, pulling more than 10 pounds (examples to avoid: groceries, vacuuming, gardening, golfing): °            - For one week with a procedure through the groin. °            - For six weeks for procedures through the chest wall or neck. °NOTE: You will typically see one of our providers 7-14 days after your procedure to discuss WHEN TO RESUME the above activities.  °  °  °DRIVING °• Do not drive until you are seen for follow up and cleared by a provider. Generally, we ask patient to not drive for 1 week after their procedure. °• If you have been told by your doctor in the past that you may not drive, you must talk with him/her before you begin driving again. °  °DRESSING °• Groin site: you may leave the clear dressing over the site for up to one week or until it falls off. °  °HYGIENE °• If you had a femoral (leg) procedure, you may take a shower when you return home. After the shower, pat the site dry. Do NOT use powder, oils or lotions in your groin area until the site has completely healed. °• If you had a chest procedure, you may shower when you return home unless specifically instructed not to by your discharging practitioner. °            - DO NOT scrub incision; pat dry with a towel. °            - DO NOT apply any lotions, oils, powders to the incision. °            - No tub baths / swimming for at least 2 weeks. °• If you notice any fevers, chills, increased pain, swelling, bleeding or pus, please contact your doctor. °  °ADDITIONAL INFORMATION °• If you are going to have an upcoming dental procedure, please contact our office as you will require antibiotics ahead of time to prevent infection on your heart valve.  ° ° °If you have any  questions or concerns you can call the structural heart phone during normal business hours 8am-4pm. If you have an urgent need after hours or weekends please call [phone] to talk to the on call provider for general cardiology. If you have an emergency that requires immediate attention, please call 911.  ° ° °After TAVR Checklist ° °Check  Test Description  ° Follow up appointment in 1-2 weeks  You will see our structural heart physician assistant, Rodney Friedman. Your incision sites will be checked and you will be cleared to drive and resume all normal activities if you are doing well.    ° 1 month echo and follow up  You will have an echo to check on your new heart valve and be seen back in the office by Rodney Friedman. Many times the echo is not read by your appointment time, but Rodney will call you later that day or the following day to report your results.  ° Follow up with your primary cardiologist You will need to be seen by your primary cardiologist in the following 3-6 months after your 1 month appointment in the valve   clinic. Often times your Plavix or Aspirin will be discontinued during this time, but this is decided on a case by case basis.   ° 1 year echo and follow up You will have another echo to check on your heart valve after 1 year and be seen back in the office by Rodney Friedman. This your last structural heart visit.  ° Bacterial endocarditis prophylaxis  You will have to take antibiotics for the rest of your life before all dental procedures (even teeth cleanings) to protect your heart valve. Antibiotics are also required before some surgeries. Please check with your cardiologist before scheduling any surgeries. Also, please make sure to tell us if you have a penicillin allergy as you will require an alternative antibiotic.   ° ° °

## 2021-02-08 NOTE — Anesthesia Postprocedure Evaluation (Signed)
Anesthesia Post Note  Patient: Rodney Friedman  Procedure(s) Performed: TRANSCATHETER AORTIC VALVE REPLACEMENT, LEFT CAROTID (Left) TRANSESOPHAGEAL ECHOCARDIOGRAM (TEE)     Patient location during evaluation: SICU Anesthesia Type: General Pain management: pain level controlled Respiratory status: spontaneous breathing Cardiovascular status: stable Postop Assessment: no apparent nausea or vomiting Anesthetic complications: no   No notable events documented.  Last Vitals:  Vitals:   02/08/21 0649 02/08/21 1211  BP: 106/65 (!) 160/84  Pulse: 88 95  Resp: 18 20  Temp: 36.6 C (!) 36.2 C  SpO2: 100%     Last Pain:  Vitals:   02/08/21 1211  TempSrc: Temporal  PainSc: 0-No pain                 Avila Albritton

## 2021-02-08 NOTE — Progress Notes (Signed)
Pt arrived from PACU, VSS, CHG complete, orders released, oriented to unit, groin level 0, wrist level 0, neck level 0. Will continue to monitor.   Chrisandra Carota, RN 02/08/2021 2:08 PM

## 2021-02-08 NOTE — Anesthesia Procedure Notes (Signed)
Procedure Name: Intubation Date/Time: 02/08/2021 9:44 AM Performed by: Carolan Clines, CRNA Pre-anesthesia Checklist: Patient identified, Emergency Drugs available, Suction available and Patient being monitored Patient Re-evaluated:Patient Re-evaluated prior to induction Oxygen Delivery Method: Circle System Utilized Preoxygenation: Pre-oxygenation with 100% oxygen Induction Type: IV induction Ventilation: Mask ventilation without difficulty Laryngoscope Size: Mac and 4 Grade View: Grade I Tube type: Oral Tube size: 7.5 mm Number of attempts: 1 Airway Equipment and Method: Stylet Placement Confirmation: ETT inserted through vocal cords under direct vision, positive ETCO2 and breath sounds checked- equal and bilateral Secured at: 22 cm Tube secured with: Tape Dental Injury: Teeth and Oropharynx as per pre-operative assessment

## 2021-02-08 NOTE — CV Procedure (Signed)
[facility]  TAVR OPERATIVE NOTE   Date of Procedure:  02/08/2021  Preoperative Diagnosis: Severe Aortic Stenosis   Postoperative Diagnosis: Same   Procedure:   Transcatheter Aortic Valve Replacement - TransCarotid Approach  Edwards Sapien 3 THV (size 26 mm, model # [id number], serial # [id number])   Co-Surgeons:  Lauree Chandler, MD and Gaye Pollack, MD   Anesthesiologist:  Nyoka Cowden  Echocardiographer:  Johnsie Cancel  Pre-operative Echo Findings: Severe aortic stenosis Normal left ventricular systolic function  Post-operative Echo Findings: Trivial paravalvular leak Normal left ventricular systolic function  BRIEF CLINICAL NOTE AND INDICATIONS FOR SURGERY  75 yo male with CAD post CABG x3 in 2006, paroxysmal atrial fibrillation, hypertension, hyperlipidemia, type 2 diabetes, former recent smoker until July 2022, OSA, CLL, prostate cancer, and severe aortic stenosis here today for TAVR.  He has had moderate aortic stenosis by previous echocardiogram and has been followed by Dr. Sallyanne Kuster.  He was hospitalized with pneumonia in July 2022 which may have been congestive heart failure and since then has had increasing shortness of breath on exertion.  Echocardiogram on 12/17/2020 showed a thickened and calcified aortic valve with reduced leaflet mobility.  The mean gradient had increased to 33.5 mmHg with a peak gradient of 57.5 mmHg.  Aortic valve area was 0.59 cm with a dimensionless index of 0.16.  Stroke-volume index was 27.  Left ventricular ejection fraction was 60 to 65%.  His gated cardiac CT showed a calcium score of 3000 consistent with severe aortic stenosis.  During the course of the patient's preoperative work up they have been evaluated comprehensively by a multidisciplinary team of specialists coordinated through the [facility] in the [facility].  They have been demonstrated to suffer from symptomatic severe  aortic stenosis as noted above. The patient has been counseled extensively as to the relative risks and benefits of all options for the treatment of severe aortic stenosis including long term medical therapy, conventional surgery for aortic valve replacement, and transcatheter aortic valve replacement.  The patient has been independently evaluated by Dr. Cyndia Bent with CT surgery and they are felt to be at high risk for conventional surgical aortic valve replacement. The surgeon indicated the patient would be a poor candidate for conventional surgery. Based upon review of all of the patient's preoperative diagnostic tests they are felt to be candidate for transcatheter aortic valve replacement using the transfemoral approach as an alternative to high risk conventional surgery.    Following the decision to proceed with transcatheter aortic valve replacement, a discussion has been held regarding what types of management strategies would be attempted intraoperatively in the event of life-threatening complications, including whether or not the patient would be considered a candidate for the use of cardiopulmonary bypass and/or conversion to open sternotomy for attempted surgical intervention.  The patient has been advised of a variety of complications that might develop peculiar to this approach including but not limited to risks of death, stroke, paravalvular leak, aortic dissection or other major vascular complications, aortic annulus rupture, device embolization, cardiac rupture or perforation, acute myocardial infarction, arrhythmia, heart block or bradycardia requiring permanent pacemaker placement, congestive heart failure, respiratory failure, renal failure, pneumonia, infection, other late complications related to structural valve deterioration or migration, or other complications that might ultimately cause a temporary or permanent loss of functional independence or other long term morbidity.  The patient  provides full informed consent for the procedure as described and all questions were answered  preoperatively.    DETAILS OF THE OPERATIVE PROCEDURE  PREPARATION:   The patient is brought to the operating room on the above mentioned date and central monitoring was established by the anesthesia team including placement of a radial arterial line. The patient is placed in the supine position on the operating table.  Intravenous antibiotics are administered. General anesthesia is used.   Baseline transesophageal echocardiogram was performed. The patient's chest, abdomen, both groins, and both lower extremities are prepared and draped in a sterile manner. A time out procedure is performed.   PERIPHERAL ACCESS:   Using the modified Seldinger technique, femoral arterial and venous access were obtained with placement of a 6 Fr sheath in the artery and a 7 Fr sheath in the vein on the right side using u/s guidance.  A pigtail diagnostic catheter was passed through the femoral arterial sheath under fluoroscopic guidance into the aortic root.  A temporary transvenous pacemaker catheter was passed through the femoral venous sheath under fluoroscopic guidance into the right ventricle.  The pacemaker was tested to ensure stable lead placement and pacemaker capture. Aortic root angiography was performed in order to determine the optimal angiographic angle for valve deployment.  TRANSCAROTID ACCESS: Please see Dr. Earlene Plater operative note for details of surgical cutdown.  The patient was heparinized systemically and ACT verified > 250 seconds.  The carotid artery was visualized. The needle was used to access the artery. A J wire was then advanced into the ascending aorta. A 8 French sheath was placed. An AL-1 catheter was used to direct the J wire across the aortic valve. This was changed over a long J wire for a pigtail catheter. The pigtail catheter was then exchanged for an Amplatz Extra-stiff wire in the LV apex.  The sheath was removed and the 14 Fr E-sheath was introduced into the carotid artery.      TRANSCATHETER HEART VALVE DEPLOYMENT:  An Edwards Sapien 3 THV (size 26 mm) was prepared and crimped per manufacturer's guidelines, and the proper orientation of the valve is confirmed on the [employer] delivery system. The valve was advanced through the introducer sheath using normal technique until in an appropriate position in the ascending aorta beyond the sheath tip. The balloon was then retracted and using the fine-tuning wheel was centered on the valve. The valve was then advanced across the aortic arch using appropriate flexion of the catheter. The valve was carefully positioned across the aortic valve annulus. The Commander catheter was retracted using normal technique. Once final position of the valve has been confirmed by angiographic assessment, the valve is deployed while temporarily holding ventilation and during rapid ventricular pacing to maintain systolic blood pressure < 50 mmHg and pulse pressure < 10 mmHg. The balloon inflation is held for >3 seconds after reaching full deployment volume. Once the balloon has fully deflated the balloon is retracted into the ascending aorta and valve function is assessed using TEE. There is felt to be trivial paravalvular leak and no central aortic insufficiency.  The patient's hemodynamic recovery following valve deployment is good.  The deployment balloon and guidewire are both removed. Echo demostrated acceptable post-procedural gradients, stable mitral valve function, and trivial AI.   PROCEDURE COMPLETION:  The sheath was then removed and Dr. Cyndia Bent closed the carotid artery. Please see his note for details. Protamine was administered once femoral arterial repair was complete. The temporary pacemaker, pigtail catheters and femoral sheaths were removed with a Mynx closure device placed in the artery and manual  pressure used for venous hemostasis.    The  patient tolerated the procedure well and is transported to the surgical intensive care in stable condition. There were no immediate intraoperative complications. All sponge instrument and needle counts are verified correct at completion of the operation.   No blood products were administered during the operation.  The patient received a total of 60 mL of intravenous contrast during the procedure.  Lauree Chandler MD 02/08/2021 12:03 PM

## 2021-02-08 NOTE — Progress Notes (Signed)
      20 g, R radial arterial line was pulled, and manual pressure was held for 10 min. No hematoma. Sterile gauze was placed at the site. Capillary refill of > 3 sec. R radial pulse 2+

## 2021-02-08 NOTE — Anesthesia Procedure Notes (Signed)
Arterial Line Insertion Start/End9/20/2022 7:25 AM Performed by: Carolan Clines, CRNA, CRNA  Patient location: Pre-op. Preanesthetic checklist: patient identified, IV checked, site marked, risks and benefits discussed, surgical consent, monitors and equipment checked, pre-op evaluation, timeout performed and anesthesia consent Lidocaine 1% used for infiltration Right, radial was placed Catheter size: 20 G Hand hygiene performed  and maximum sterile barriers used   Attempts: 1 Procedure performed without using ultrasound guided technique. Following insertion, dressing applied and Biopatch. Post procedure assessment: normal and unchanged  Patient tolerated the procedure well with no immediate complications.

## 2021-02-08 NOTE — Op Note (Signed)
[facility]   MULTIDISCIPLINARY HEART VALVE TEAM   TAVR OPERATIVE NOTE   Date of Procedure:  02/08/2021  Preoperative Diagnosis: Severe Aortic Stenosis   Postoperative Diagnosis: Same   Procedure:   Transcatheter Aortic Valve Replacement - Left Common Carotid Artery Approach  Edwards Sapien 3 Ultra THV (size 26 mm, model # [id number], serial # [id number])   Co-Surgeons:  Gaye Pollack, MD and  Lauree Chandler, MD   Anesthesiologist:  C. Nyoka Cowden, MD  Echocardiographer:  P. Nishan, Md  Pre-operative Echo Findings: Severe aortic stenosis Severe aortic insufficiency Severe mitral regurgitation Normal left ventricular systolic function  Post-operative Echo Findings: Trivial paravalvular leak Unchanged severe mitral regurgitation Normal left ventricular systolic function   BRIEF CLINICAL NOTE AND INDICATIONS FOR SURGERY  This 75 year old gentleman has stage D, severe, symptomatic aortic stenosis with New York Heart Association class III symptoms of exertional fatigue and shortness of breath as well as some symptoms at rest and orthopnea consistent with chronic diastolic congestive heart failure.  I have personally reviewed his 2D echocardiogram, cardiac catheterization, and CTA studies.  His echocardiogram shows a calcified and thickened aortic valve with restricted leaflet mobility.  The mean gradient is 33.5 mmHg with an aortic valve area of 0.59 cm consistent with severe aortic stenosis.  There is mild aortic insufficiency.  Left ventricular ejection fraction is 60 to 65%.  Cardiac catheterization shows severe native three-vessel coronary disease with 3 out of 3 patent bypass grafts.  I agree that aortic valve replacement is indicated in this patient for relief of his symptoms and to prevent progressive left ventricular deterioration.  He has had 3 admissions over the past few months for congestive heart failure.  Given his age and prior coronary bypass surgery I  think that transcatheter aortic valve replacement is the best option for him.  His gated cardiac CTA shows anatomy suitable for TAVR using a 26 mm Edwards SAPIEN 3 valve.  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  Some of this calcification is concentric and I think transfemoral insertion would have significant risk of vascular complications.  His left common carotid artery and left subclavian artery appear to be of adequate size with no significant disease.  Since he has a patent left internal mammary graft I think the best option is to use his left common carotid artery.  I discussed that with him.   The patient was counseled at length regarding treatment alternatives for management of severe symptomatic aortic stenosis. The risks and benefits of surgical intervention has been discussed in detail. Long-term prognosis with medical therapy was discussed. Alternative approaches such as conventional surgical aortic valve replacement, transcatheter aortic valve replacement, and palliative medical therapy were compared and contrasted at length. This discussion was placed in the context of the patient's own specific clinical presentation and past medical history. All of his questions have been addressed.    Following the decision to proceed with transcatheter aortic valve replacement, a discussion was held regarding what types of management strategies would be attempted intraoperatively in the event of life-threatening complications, including whether or not the patient would be considered a candidate for the use of cardiopulmonary bypass and/or conversion to open sternotomy for attempted surgical intervention. Given his prior coronary bypass surgery, 75, and frailty I do not think he would be a candidate for emergent sternotomy to manage any intraoperative complications.  The patient is aware of the fact that transient use of cardiopulmonary bypass may be  necessary. The  patient has been advised of a variety of complications that might develop including but not limited to risks of death, stroke, paravalvular leak, aortic dissection or other major vascular complications, aortic annulus rupture, device embolization, cardiac rupture or perforation, mitral regurgitation, acute myocardial infarction, arrhythmia, heart block or bradycardia requiring permanent pacemaker placement, congestive heart failure, respiratory failure, renal failure, pneumonia, infection, other late complications related to structural valve deterioration or migration, or other complications that might ultimately cause a temporary or permanent loss of functional independence or other long term morbidity. The patient provides full informed consent for the procedure as described and all questions were answered.     DETAILS OF THE OPERATIVE PROCEDURE  PREPARATION:    The patient was brought to the operating room on the above mentioned date and appropriate monitoring was established by the anesthesia team. The patient was placed in the supine position on the operating table.  Intravenous antibiotics were administered. General endotracheal anesthesia was induced uneventfully.    Baseline transesophageal echocardiogram was performed. This showed severe AS and severe AI as were as severe MR due to immobility of the posterior mitral leaflet. The preop echo on 12/17/2020 only showed mild AI and trivial MR. The patient's left neck, abdomen and both groins were prepped and draped in a sterile manner. A time out procedure was performed.   PERIPHERAL ACCESS:    Using the modified Seldinger technique, femoral arterial and venous access was obtained with placement of 6 Fr sheaths on the right side.  A pigtail diagnostic catheter was passed through the right arterial sheath under fluoroscopic guidance into the aortic root.  A temporary transvenous pacemaker catheter was passed through the right femoral venous sheath  under fluoroscopic guidance into the right ventricle.  The pacemaker was tested to ensure stable lead placement and pacemaker capture.    LEFT CAROTID ACCESS:   A transverse incision was made at the base of the left neck between the heads of the sternocleidomastoid muscle and carried down through the subcutaneous tissue using electrocautery. The platysma muscle was divided. The carotid sheath was opened and the common carotid artery identified. The patient was heparinized systemically and ACT verified > 250 seconds.  A double concentric purse string suture of CV-4 gortex was placed in the anterior wall of the artery. The artery was cannulated with a needle and a J- wire advanced into the ascending aorta. An 8 F sheath was inserted over the wire. The degenerated aortic valve prosthesis was crossed with an AL-2  catheter and a straight wire. This was exchanged for a pigtail catheter and position was confirmed in the LV apex.  The pigtail catheter was exchanged for an Amplatz Extra-stiff wire in the LV apex.  Then a 55F E-sheath was inserted into the carotid artery and the tip advanced into the aortic arch.    BALLOON AORTIC VALVULOPLASTY:   Not performed   TRANSCATHETER HEART VALVE DEPLOYMENT:   An Edwards Sapien 3 Ultra transcatheter heart valve (size 26 mm) was prepared and crimped per manufacturer's guidelines, and the proper orientation of the valve is confirmed on the [employer] delivery system. The valve was advanced through the introducer sheath using normal technique until in an appropriate position in the ascending aorta. The balloon was then retracted and using the fine-tuning wheel was centered on the valve. The valve was carefully positioned across the aortic valve annulus. The Commander catheter was retracted using normal technique. Once final position of the valve has been confirmed  by angiographic assessment, the valve is deployed while temporarily holding ventilation and during  rapid ventricular pacing to maintain systolic blood pressure < 50 mmHg and pulse pressure < 10 mmHg. The balloon inflation is held for >3 seconds after reaching full deployment volume. Once the balloon has fully deflated the balloon is retracted into the ascending aorta and valve function is assessed using echocardiography. There is felt to be trivial paravalvular leak and no central aortic insufficiency.  The patient's hemodynamic recovery following valve deployment is good.  The deployment balloon and guidewire are both removed.    PROCEDURE COMPLETION:   The sheath was removed and carotid artery closure performed.  Protamine was administered once carotid arterial repair was complete. The temporary pacemaker, pigtail catheter and femoral sheaths were removed with manual pressure used for hemostasis.  A Mynx femoral closure device was utilized following removal of the diagnostic sheath in the right femoral artery. The neck incision was closed using continuous 3-0 Vicryl suture to approximate the platysma muscle and 3-0 Vicryl subcuticular skin closure.  The patient tolerated the procedure well and was awakened, extubated and transported to the cath lab recovery area in stable condition. There were no immediate intraoperative complications. All sponge instrument and needle counts are verified correct at completion of the operation.   No blood products were administered during the operation.  The patient received a total of 60 mL of intravenous contrast during the procedure.   Gaye Pollack, MD 02/08/2021

## 2021-02-08 NOTE — Interval H&P Note (Signed)
History and Physical Interval Note:  02/08/2021 7:09 AM  Owens Loffler  has presented today for surgery, with the diagnosis of Severe Aortic Stenosis.  The various methods of treatment have been discussed with the patient and family. After consideration of risks, benefits and other options for treatment, the patient has consented to  Procedure(s): TRANSCATHETER AORTIC VALVE REPLACEMENT, LEFT CAROTID (Left) TRANSESOPHAGEAL ECHOCARDIOGRAM (TEE) (N/A) as a surgical intervention.  The patient's history has been reviewed, patient examined, no change in status, stable for surgery.  I have reviewed the patient's chart and labs.  Questions were answered to the patient's satisfaction.     Gaye Pollack

## 2021-02-08 NOTE — Progress Notes (Signed)
Mobility Specialist Progress Note    02/08/21 1752  Mobility  Activity Ambulated in hall  Level of Assistance Standby assist, set-up cues, supervision of patient - no hands on  Assistive Device None  Distance Ambulated (ft) 490 ft  Mobility Ambulated independently in hallway  Mobility Response Tolerated well  Mobility performed by Mobility specialist  Bed Position Chair  $Mobility charge 1 Mobility   During Mobility: 86 HR, 100% SpO2 Post-Mobility: 95 HR, 155/82 BP, 100% SpO2  Pt found in bed preparing for BR with NT and agreeable to ambulate. Had successful void. Asx in hall and returned to chair with call bell in reach.   [name] Mobility Specialist  Mobility Specialist Phone: [phone]

## 2021-02-08 NOTE — Progress Notes (Addendum)
  [place] VALVE TEAM  Patient doing well s/p TAVR. He is hemodynamically stable. Groin/carotid sites stable. ECG with atrial flutter and no high grade block. Arterial line discontinued and transferred to 4E.  Plan for early ambulation after bedrest completed and hopeful discharge over the next 24-48 hours.   Angelena Form PA-C  [facility]  Pager [phone]

## 2021-02-09 ENCOUNTER — Other Ambulatory Visit: Payer: Self-pay

## 2021-02-09 ENCOUNTER — Inpatient Hospital Stay (HOSPITAL_COMMUNITY): Payer: Medicare Other

## 2021-02-09 ENCOUNTER — Encounter (HOSPITAL_COMMUNITY): Payer: Self-pay | Admitting: Cardiovascular Disease

## 2021-02-09 DIAGNOSIS — C9110 Chronic lymphocytic leukemia of B-cell type not having achieved remission: Secondary | ICD-10-CM | POA: Diagnosis not present

## 2021-02-09 DIAGNOSIS — Z006 Encounter for examination for normal comparison and control in clinical research program: Secondary | ICD-10-CM | POA: Diagnosis not present

## 2021-02-09 DIAGNOSIS — Z954 Presence of other heart-valve replacement: Secondary | ICD-10-CM | POA: Diagnosis not present

## 2021-02-09 DIAGNOSIS — I352 Nonrheumatic aortic (valve) stenosis with insufficiency: Secondary | ICD-10-CM | POA: Diagnosis not present

## 2021-02-09 DIAGNOSIS — I350 Nonrheumatic aortic (valve) stenosis: Secondary | ICD-10-CM

## 2021-02-09 DIAGNOSIS — I482 Chronic atrial fibrillation, unspecified: Secondary | ICD-10-CM | POA: Diagnosis not present

## 2021-02-09 LAB — ECHOCARDIOGRAM COMPLETE
AR max vel: 3.56 cm2
AV Area VTI: 4.58 cm2
AV Area mean vel: 3.6 cm2
AV Mean grad: 11.5 mmHg
AV Peak grad: 22.8 mmHg
Ao pk vel: 2.39 m/s
Area-P 1/2: 2.03 cm2
Calc EF: 38.5 %
Height: 68 in
MV M vel: 4.94 m/s
MV Peak grad: 97.6 mmHg
MV VTI: 1.47 cm2
Radius: 0.4 cm
S' Lateral: 3.4 cm
Single Plane A2C EF: 36.6 %
Single Plane A4C EF: 44.4 %
Weight: 3208 oz

## 2021-02-09 LAB — BASIC METABOLIC PANEL
Anion gap: 10 (ref 5–15)
BUN: 21 mg/dL (ref 8–23)
CO2: 21 mmol/L — ABNORMAL LOW (ref 22–32)
Calcium: 8.8 mg/dL — ABNORMAL LOW (ref 8.9–10.3)
Chloride: 109 mmol/L (ref 98–111)
Creatinine, Ser: 1.34 mg/dL — ABNORMAL HIGH (ref 0.61–1.24)
GFR, Estimated: 56 mL/min — ABNORMAL LOW (ref >60)
Glucose, Bld: 102 mg/dL — ABNORMAL HIGH (ref 70–99)
Potassium: 3.9 mmol/L (ref 3.5–5.1)
Sodium: 140 mmol/L (ref 135–145)

## 2021-02-09 LAB — CBC
HCT: 34.6 % — ABNORMAL LOW (ref 39.0–52.0)
Hemoglobin: 11.9 g/dL — ABNORMAL LOW (ref 13.0–17.0)
MCH: 30.8 pg (ref 26.0–34.0)
MCHC: 34.4 g/dL (ref 30.0–36.0)
MCV: 89.6 fL (ref 80.0–100.0)
Platelets: 151 10*3/uL (ref 150–400)
RBC: 3.86 MIL/uL — ABNORMAL LOW (ref 4.22–5.81)
RDW: 14.6 % (ref 11.5–15.5)
WBC: 14.8 10*3/uL — ABNORMAL HIGH (ref 4.0–10.5)
nRBC: 0 % (ref 0.0–0.2)

## 2021-02-09 LAB — GLUCOSE, CAPILLARY: Glucose-Capillary: 104 mg/dL — ABNORMAL HIGH (ref 70–99)

## 2021-02-09 LAB — MAGNESIUM: Magnesium: 1.8 mg/dL (ref 1.7–2.4)

## 2021-02-09 MED ORDER — METOPROLOL SUCCINATE ER 50 MG PO TB24
50.0000 mg | ORAL_TABLET | Freq: Two times a day (BID) | ORAL | Status: DC
  Administered 2021-02-09: 50 mg via ORAL
  Filled 2021-02-09: qty 1

## 2021-02-09 MED ORDER — ASPIRIN 81 MG PO CHEW: 81.0000 mg | CHEWABLE_TABLET | Freq: Every day | ORAL | Status: DC

## 2021-02-09 NOTE — Progress Notes (Signed)
Mobility Specialist Progress Note    02/09/21 1144  Mobility  Activity Ambulated in hall  Level of Assistance Independent  Assistive Device None  Distance Ambulated (ft) 490 ft  Mobility Ambulated independently in hallway  Mobility Response Tolerated well  Mobility performed by Mobility specialist  $Mobility charge 1 Mobility   Pt found in chair and agreeable to ambulate before D/C. X3 short standing breaks to catch breath, otherwise asx. Returned to room with call bell in reach and lunch.   [name] Mobility Specialist  Mobility Specialist Phone: [phone]

## 2021-02-09 NOTE — Progress Notes (Signed)
D/C instructions given to patient. Wound care and medications reviewed. All questions answered. IV removed, clean and intact.  Clyde Canterbury, RN

## 2021-02-09 NOTE — Progress Notes (Signed)
1 Day Post-Op Procedure(s) (LRB): TRANSCATHETER AORTIC VALVE REPLACEMENT, LEFT CAROTID (Left) TRANSESOPHAGEAL ECHOCARDIOGRAM (TEE) (N/A) Subjective: No complaints. Says he feels great. Ambulating in halls without chest pain or shortness of breath.  Objective: Vital signs in last 24 hours: Temp:  [97.2 F (36.2 C)-98 F (36.7 C)] 98 F (36.7 C) (09/21 0411) Pulse Rate:  [68-98] 82 (09/21 0411) Cardiac Rhythm: Atrial flutter;Bundle branch block (09/20 1903) Resp:  [19-20] 20 (09/21 0411) BP: (111-160)/(64-102) 111/70 (09/21 0411) SpO2:  [87 %-99 %] 92 % (09/21 0411) Weight:  [90.9 kg] 90.9 kg (09/21 0543)  Hemodynamic parameters for last 24 hours:    Intake/Output from previous day: 09/20 0701 - 09/21 0700 In: 2093.6 [P.O.:500; I.V.:1393.6; IV Piggyback:200] Out: 535 [Urine:495; Blood:40] Intake/Output this shift: No intake/output data recorded.  General appearance: alert and cooperative Neurologic: intact Heart: irregularly irregular rhythm and no murmur Lungs: clear to auscultation bilaterally Extremities: extremities normal Wound: right groin site ok. Left neck incision ok  Lab Results: Recent Labs    02/08/21 1248 02/09/21 0116  WBC  --  14.8*  HGB 12.2* 11.9*  HCT 36.0* 34.6*  PLT  --  151   BMET:  Recent Labs    02/08/21 1248 02/09/21 0116  NA 142 140  K 4.2 3.9  CL 108 109  CO2  --  21*  GLUCOSE 154* 102*  BUN 20 21  CREATININE 1.20 1.34*  CALCIUM  --  8.8*    PT/INR:  Recent Labs    02/08/21 0732  LABPROT 16.7*  INR 1.4*   ABG    Component Value Date/Time   PHART 7.424 02/04/2021 1500   HCO3 21.3 02/04/2021 1500   TCO2 22 02/08/2021 1248   ACIDBASEDEF 2.4 (H) 02/04/2021 1500   O2SAT 98.0 02/04/2021 1500   CBG (last 3)  Recent Labs    02/08/21 1527 02/08/21 2139 02/09/21 0526  GLUCAP 133* 174* 104*    Assessment/Plan: S/P Procedure(s) (LRB): TRANSCATHETER AORTIC VALVE REPLACEMENT, LEFT CAROTID (Left) TRANSESOPHAGEAL  ECHOCARDIOGRAM (TEE) (N/A)  POD 1 Hemodynamically stable in chronic atrial fib. Resume Toprol at discharge for atrial fib rate control.  Resume Warfarin for atrial fib at discharge. Can continue ASA 81 for a week while INR increasing but does not need ASA long term.  2D echo this am and plan home later this am.     LOS: 1 day    Gaye Pollack 02/09/2021

## 2021-02-09 NOTE — Transfer of Care (Signed)
Immediate Anesthesia Transfer of Care Note  Patient: Rodney Friedman  Procedure(s) Performed: TRANSCATHETER AORTIC VALVE REPLACEMENT, LEFT CAROTID (Left) TRANSESOPHAGEAL ECHOCARDIOGRAM (TEE)  Patient Location: Cath Lab  Anesthesia Type:General  Level of Consciousness: awake, alert  and oriented  Airway & Oxygen Therapy: Patient Spontanous Breathing and Patient connected to face mask oxygen  Post-op Assessment: Report given to RN and Post -op Vital signs reviewed and stable  Post vital signs: Reviewed and stable  Last Vitals: see cath lab flow sheet Vitals Value Taken Time  BP    Temp    Pulse    Resp    SpO2    Vitals shown include unvalidated device data.  Last Pain:  Vitals:   02/09/21 0759  TempSrc: Oral  PainSc:          Complications: No notable events documented.

## 2021-02-09 NOTE — Progress Notes (Signed)
  Echocardiogram 2D Echocardiogram has been performed.  Rodney Friedman 02/09/2021, 9:53 AM

## 2021-02-09 NOTE — Progress Notes (Addendum)
CARDIAC REHAB PHASE I   PRE:  Rate/Rhythm: 95 Afib with PVCs  BP:  Sitting: 135/96      SaO2: 95 RA  MODE:  Ambulation: 470 ft   POST:  Rate/Rhythm: 117 Afib with PVCs  BP:  Sitting: 136/101    SaO2: 97 RA   Pt ambulated 467ft in hallway handheld assist with slow, steady gait. Pt took two short standing rest breaks. Notes some SOB upon return to room, but much improved from before procedure. Pt educated on site care and restrictions. Encouraged continued ambulation with emphasis on safety. Declines CRP II at this time. D/c today.  [id number] Rufina Falco, RN BSN 02/09/2021 9:24 AM

## 2021-02-09 NOTE — Progress Notes (Signed)
Plan of care is reviewed. Pt is progressing. Pt remains afebrile, no acute distress, hemodynamics stable. Atrial flutter--> Atrial fibrillation on the monitor. Heart rate is under controlled. BP is within normal limits. Pt is tolerated pain well. He has been sleeping well tonight. Left neck and groin incision are negative for hematoma. Pt is able to ambulate well with one standby assist, no immediate distress noted.  Spo2 88% at night, 2 LPM of O2 NCL was given while he was sleeping. Lungs clear auscultated. Denies shortness of breath. We will monitor.   Kennyth Lose, RN

## 2021-02-09 NOTE — Anesthesia Postprocedure Evaluation (Signed)
Anesthesia Post Note  Patient: Rodney Friedman  Procedure(s) Performed: TRANSCATHETER AORTIC VALVE REPLACEMENT, LEFT CAROTID (Left) TRANSESOPHAGEAL ECHOCARDIOGRAM (TEE)     Patient location during evaluation: ICU Anesthesia Type: General Level of consciousness: awake Pain management: pain level controlled Vital Signs Assessment: post-procedure vital signs reviewed and stable Respiratory status: spontaneous breathing Postop Assessment: no apparent nausea or vomiting Anesthetic complications: no   No notable events documented.  Last Vitals:  Vitals:   02/09/21 0759 02/09/21 1105  BP: 130/85 112/67  Pulse: 94 95  Resp: 20 18  Temp: 36.9 C 36.9 C  SpO2: 92% 97%    Last Pain:  Vitals:   02/09/21 1105  TempSrc: Oral  PainSc:                  [name]

## 2021-02-09 NOTE — Discharge Summary (Signed)
[facility] VALVE TEAM  Discharge Summary    Patient ID: Rodney Friedman MRN: [id number]; DOB: June 18, 1945  Admit date: 02/08/2021 Discharge date: 02/09/2021  Primary Care Provider: Kristie Cowman, MD  Primary Cardiologist: Sanda Klein, MD / Dr. Angelena Form & Dr. Cyndia Bent (TAVR)  Discharge Diagnoses    Principal Problem:   S/P TAVR (transcatheter aortic valve replacement) Active Problems:   CLL (chronic lymphocytic leukemia) (HCC)   Coronary artery disease of native artery of native heart with stable angina pectoris (HCC)   Chronic atrial fibrillation (HCC)   Aortic valve stenosis, nonrheumatic   Mixed hyperlipidemia   Mild obesity   Paroxysmal atrial fibrillation (HCC)   Diabetes mellitus (HCC)   OSA (obstructive sleep apnea)   Essential hypertension   Diabetes mellitus type 2 in obese (HCC)   Allergies Allergies  Allergen Reactions   Other Rash    Allergen: "Plants and bushes while doing yard work"    Diagnostic Studies/Procedures    TAVR OPERATIVE NOTE     Date of Procedure:                02/08/2021   Preoperative Diagnosis:      Severe Aortic Stenosis    Postoperative Diagnosis:    Same    Procedure:        Transcatheter Aortic Valve Replacement - Left Common Carotid Artery Approach             Edwards Sapien 3 Ultra THV (size 26 mm, model # 9750TFX, serial # [id number])              Co-Surgeons:                        Gaye Pollack, MD and  Lauree Chandler, MD     Anesthesiologist:                  Gennie Alma, MD   Echocardiographer:              Edmonia James, Md   Pre-operative Echo Findings: Severe aortic stenosis Severe aortic insufficiency Severe mitral regurgitation Normal left ventricular systolic function   Post-operative Echo Findings: Trivial paravalvular leak Unchanged severe mitral regurgitation Normal left ventricular systolic function   _____________    Echo 02/09/21: completed but pending  formal read at the time of discharge   History of Present Illness     Rodney Friedman is a 75 y.o. male with a history of CAD s/p 3V CABG in 2006, paroxysmal atrial fibrillation on Coumadin, carotid artery disease, HTN, HLD, DM type 2, former etoh abuse (stopped drinking etoh in 2002), former tobacco abuse (stopped July 2022), CLL, sleep apnea and severe aortic stenosis who presented to [facility] on 02/08[facility]21 for planned TAVR.   He has had moderate aortic stenosis by previous echocardiogram and has been followed by Dr. Sallyanne Kuster.  He was hospitalized with pneumonia in July 2022 which may have been congestive heart failure and since then has had increasing shortness of breath on exertion.  Echocardiogram on 12/17/2020 showed a thickened and calcified aortic valve with reduced leaflet mobility.  The mean gradient had increased to 33.5 mmHg with a peak gradient of 57.5 mmHg.  Aortic valve area was 0.59 cm with a dimensionless index of 0.16.  Stroke-volume index was 27.  Left ventricular ejection fraction was 60 to 65%.  His gated cardiac CT showed a calcium score of 3000 consistent with severe aortic stenosis.  L/RHC on 01/20/21 showed severe triple vessel CAD s/p 3V CABG with 3/3 patent bypass grafts. There was severe aortic stenosis by echo (by cath the mean gradient is 32 mmhg, peak to peak gradient is 38 mmHg, AVA 1.0 cm2). He was admitted after his cath for shortness of breath and diuresis. He was then readmitted 9/5-01/25/21 for acute CHF in the setting of missing some medications.   The patient has been evaluated by the multidisciplinary valve team and felt to have severe, symptomatic aortic stenosis and to be a suitable candidate for TAVR, which was set up for 02/08/21.   Hospital Course     Consultants: none   Severe AS: s/p successful TAVR with a 26 mm Edwards Sapien 3 Ultra  THV via the left carotid approach on 02/08/21. Post operative echo completed but pending formal read. Groin/carotid sites are stable. ECG  with afib with old IVCD and no high grade heart block. Resumed on home Coumadin with the addition of a baby Asprin until he is therapeutic.   Chronic afib: resumed on home Toprol XL and Coumadin. He has an INR check on Monday 9/26.  CAD s/p CABG: pre TAVR cath show 3/3 patent bypass grafts. Continue BB and statin. Only temporary aspirin until Coumadin therapeutic.   DMT2: treated with SSI while admitted. Resume home meds at discharge. Okay to resume Metformin after 48 hours after contrast dye exposure (9/22 PM)   Chronic diastolic CHF: resumed on home Lasix.   HTN: BP well controlled. Resumed on home meds.   _____________  Discharge Vitals Blood pressure 130/85, pulse 94, temperature 98.5 F (36.9 C), temperature source Oral, resp. rate 20, height 5\' 8"  (1.727 m), weight 90.9 kg, SpO2 92 %.  Filed Weights   02/08/21 0649 02/09/21 0543  Weight: 89.8 kg 90.9 kg    Labs & Radiologic Studies    CBC Recent Labs    02/08/21 1248 02/09/21 0116  WBC  --  14.8*  HGB 12.2* 11.9*  HCT 36.0* 34.6*  MCV  --  89.6  PLT  --  762   Basic Metabolic Panel Recent Labs    02/08/21 1248 02/09/21 0116  NA 142 140  K 4.2 3.9  CL 108 109  CO2  --  21*  GLUCOSE 154* 102*  BUN 20 21  CREATININE 1.20 1.34*  CALCIUM  --  8.8*  MG  --  1.8   Liver Function Tests No results for input(s): AST, ALT, ALKPHOS, BILITOT, PROT, ALBUMIN in the last 72 hours. No results for input(s): LIPASE, AMYLASE in the last 72 hours. Cardiac Enzymes No results for input(s): CKTOTAL, CKMB, CKMBINDEX, TROPONINI in the last 72 hours. BNP Invalid input(s): POCBNP D-Dimer No results for input(s): DDIMER in the last 72 hours. Hemoglobin A1C No results for input(s): HGBA1C in the last 72 hours. Fasting Lipid Panel No results for input(s): CHOL, HDL, LDLCALC, TRIG, CHOLHDL, LDLDIRECT in the last 72 hours. Thyroid Function Tests No results for input(s): TSH, T4TOTAL, T3FREE, THYROIDAB in the last 72  hours.  Invalid input(s): FREET3 _____________  DG Chest 2 View  Result Date: 02/04/2021 CLINICAL DATA:  75 year old male with a history of upcoming surgery EXAM: CHEST - 2 VIEW COMPARISON:  01/24/2021 FINDINGS: Cardiomediastinal silhouette decreased slightly in size and contour. Interval resolution of interlobular septal thickening. Coarsened interstitial markings persist. Surgical changes of median sternotomy. Blunting of the costophrenic sulcus on the lateral view with blunting of the right costophrenic angle. No acute displaced fracture. Degenerative changes of  the spine. IMPRESSION: Improving appearance of the lungs, with residual small right pleural effusion. Surgical changes of median sternotomy Electronically Signed   By: [name] Mckusick D.O.   On: 02/04/2021 15:56   CARDIAC CATHETERIZATION  Result Date: 01/20/2021   Prox Cx lesion is 99% stenosed.   Prox Cx to Mid Cx lesion is 100% stenosed.   Prox LAD lesion is 70% stenosed.   Mid LAD lesion is 100% stenosed.   Prox RCA lesion is 100% stenosed.   SVG graft was visualized by angiography and is large.   LIMA graft was visualized by angiography.   SVG graft was visualized by angiography and is large.   The graft exhibits no disease.   The graft exhibits no disease.   The graft exhibits no disease. Severe triple vessel CAD s/p 3V CABG with 3/3 patent bypass grafts 100% mid LAD occlusion. The mid and distal LAD fills from the patent LIMA graft. The Diagonal branch fills from the patent vein graft. 100% mid Circumflex occlusion. The vein graft to the OM branch is patent The RCA has chronic proximal total occlusion. The distal vessel fills from left to right collaterals through the vein graft to OM. Severe aortic stenosis by echo (by cath the mean gradient is 32 mmhg, peak to peak gradient is 38 mmHg, AVA 1.0 cm2). Recommendations: Will continue medical management of CAD. Continue workup for TAVR.   DG Chest Portable 1 View  Result Date:  01/24/2021 CLINICAL DATA:  Shortness of breath EXAM: PORTABLE CHEST 1 VIEW COMPARISON:  12/17/2020 FINDINGS: Cardiomegaly with hazy chest and interstitial coarsening. Small left pleural effusion. Prior median sternotomy. No pneumothorax. IMPRESSION: CHF pattern including small left pleural effusion Electronically Signed   By: Monte Fantasia M.D.   On: 01/24/2021 04:23   ECHO TEE  Result Date: 02/08/2021    TRANSESOPHOGEAL ECHO REPORT   Patient Name:   MATEUS REWERTS Date of Exam: 02/08/2021 Medical Rec #:  [id number]       Height:       68.0 in Accession #:    [id number]      Weight:       198.0 lb Date of Birth:  Feb 07, 1946       BSA:          2.035 m Patient Age:    7 years        BP:           106/65 mmHg Patient Gender: M               HR:           88 bpm. Exam Location:  Inpatient Procedure: Transesophageal Echo, Color Doppler and Cardiac Doppler Indications:     Aortic Stenosis i35.0  History:         Patient has prior history of Echocardiogram examinations, most                  recent 12/17/2020. CAD, Arrythmias:Atrial Fibrillation; Risk                  Factors:Hypertension, Diabetes, Dyslipidemia and Sleep Apnea.  Sonographer:     Raquel Sarna Senior RDCS Referring Phys:  [place] Diagnosing Phys: Jenkins Rouge MD  Sonographer Comments: 26mm Edwards [phone] TAVR Implanted PROCEDURE: After discussion of the risks and benefits of a TEE, an informed consent was obtained from the patient. The transesophogeal probe was passed without difficulty through the esophogus of the patient. Sedation performed by different  physician. The patient was monitored while under deep sedation. The patient developed no complications during the procedure. IMPRESSIONS  1. Inferobasal hypokinesis . Left ventricular ejection fraction, by estimation, is 50 to 55%. The left ventricle has low normal function. The left ventricle demonstrates regional wall motion abnormalities (see scoring diagram/findings for description).  2.  Right ventricular systolic function is normal. The right ventricular size is normal.  3. LAA appears to have been clipped . Left atrial size was moderately dilated. No left atrial/left atrial appendage thrombus was detected.  4. 3D imaging MV with restricted posterior leaflet motion likely functional atrial MR with ischemic component in setting of inferior basal hypokinesis . The mitral valve is abnormal. Moderate to severe mitral valve regurgitation. Severe mitral annular calcification.  5. Pre TAVR: calcified restriced valve tri leaflet severe AR and severe AS with mean gradient by TTE ( patient has a large hiatal hernia with no transgastric doppler available) mean 40 peak 62 mmHg AVA 0.67 cm2         Post TAVR well positioned 26 mm Sapien 3 Ultra valve Trivial PVL seen 3:00 on short axis images. mean gradient 2 peak 4 mmHg AVA 2.8 cm2. The aortic valve has been repaired/replaced. Aortic valve regurgitation is severe. Severe aortic valve stenosis. FINDINGS  Left Ventricle: Inferobasal hypokinesis. Left ventricular ejection fraction, by estimation, is 50 to 55%. The left ventricle has low normal function. The left ventricle demonstrates regional wall motion abnormalities. The left ventricular internal cavity size was normal in size. Right Ventricle: The right ventricular size is normal. No increase in right ventricular wall thickness. Right ventricular systolic function is normal. Left Atrium: LAA appears to have been clipped. Left atrial size was moderately dilated. No left atrial/left atrial appendage thrombus was detected. Right Atrium: Right atrial size was normal in size. Pericardium: There is no evidence of pericardial effusion. Mitral Valve: 3D imaging MV with restricted posterior leaflet motion likely functional atrial MR with ischemic component in setting of inferior basal hypokinesis. The mitral valve is abnormal. There is moderate thickening of the mitral valve leaflet(s). There is moderate calcification  of the mitral valve leaflet(s). Severe mitral annular calcification. Moderate to severe mitral valve regurgitation. MV peak gradient, 12.8 mmHg. The mean mitral valve gradient is 6.0 mmHg. Tricuspid Valve: The tricuspid valve is normal in structure. Tricuspid valve regurgitation is mild. Aortic Valve: Pre TAVR: calcified restriced valve tri leaflet severe AR and severe AS with mean gradient by TTE ( patient has a large hiatal hernia with no transgastric doppler available) mean 40 peak 62 mmHg AVA 0.67 cm2 Post TAVR well positioned 26 mm Sapien 3 Ultra valve Trivial PVL seen 3:00 on short axis images. mean gradient 2 peak 4 mmHg AVA 2.8 cm2. The aortic valve has been repaired/replaced. Aortic valve regurgitation is severe. Severe aortic stenosis is present. Aortic valve mean gradient measures 2.5 mmHg. Aortic valve peak gradient measures 4.9 mmHg. Aortic valve area, by VTI measures 2.44 cm. Pulmonic Valve: The pulmonic valve was not well visualized. Pulmonic valve regurgitation is not visualized. Aorta: The aortic root is normal in size and structure. IAS/Shunts: No atrial level shunt detected by color flow Doppler.  LEFT VENTRICLE PLAX 2D LVOT diam:     2.20 cm LV SV:         45 LV SV Index:   22 LVOT Area:     3.80 cm  AORTIC VALVE AV Area (Vmax):    2.59 cm AV Area (Vmean):   2.87 cm AV  Area (VTI):     2.44 cm AV Vmax:           110.50 cm/s AV Vmean:          64.000 cm/s AV VTI:            0.184 m AV Peak Grad:      4.9 mmHg AV Mean Grad:      2.5 mmHg LVOT Vmax:         75.30 cm/s LVOT Vmean:        48.400 cm/s LVOT VTI:          0.118 m LVOT/AV VTI ratio: 0.64 MITRAL VALVE MV Area VTI:  1.29 cm       SHUNTS MV Peak grad: 12.8 mmHg      Systemic VTI:  0.12 m MV Mean grad: 6.0 mmHg       Systemic Diam: 2.20 cm MV Vmax:      1.79 m/s MV Vmean:     117.0 cm/s MR Peak grad:    96.8 mmHg MR Mean grad:    55.0 mmHg MR Vmax:         492.00 cm/s MR Vmean:        342.0 cm/s MR PISA:         5.09 cm MR PISA Eff ROA:  33 mm MR PISA Radius:  0.90 cm Jenkins Rouge MD Electronically signed by Jenkins Rouge MD Signature Date/Time: 02/08/2021/11:40:57 AM    Final    VAS US CAROTID  Result Date: 02/04/2021 Carotid Arterial Duplex Study Patient Name:  NYSHAWN GOWDY  Date of Exam:   02/04/2021 Medical Rec #: [id number]        Accession #:    [id number] Date of Birth: 12-26-1945        Patient Gender: M Patient Age:   67 years Exam Location:  [facility] Procedure:      VAS US CAROTID Referring Phys: Lauree Chandler --------------------------------------------------------------------------------  Indications:      Pre tavr. Risk Factors:     Hypertension, hyperlipidemia, Diabetes, coronary artery                   disease. Comparison Study: no prior Performing Technologist: Archie Patten RVS  Examination Guidelines: A complete evaluation includes B-mode imaging, spectral Doppler, color Doppler, and power Doppler as needed of all accessible portions of each vessel. Bilateral testing is considered an integral part of a complete examination. Limited examinations for reoccurring indications may be performed as noted.  Right Carotid Findings: +----------+--------+--------+--------+------------------+--------+           PSV cm/sEDV cm/sStenosisPlaque DescriptionComments +----------+--------+--------+--------+------------------+--------+ CCA Prox  44                      heterogenous               +----------+--------+--------+--------+------------------+--------+ CCA Distal32                      heterogenous               +----------+--------+--------+--------+------------------+--------+ ICA Prox  53      13      1-39%   heterogenous               +----------+--------+--------+--------+------------------+--------+ ICA Distal59      15                                         +----------+--------+--------+--------+------------------+--------+  ECA       116                                                 +----------+--------+--------+--------+------------------+--------+ +----------+--------+-------+--------+-------------------+           PSV cm/sEDV cmsDescribeArm Pressure (mmHG) +----------+--------+-------+--------+-------------------+ [id number]                                         +----------+--------+-------+--------+-------------------+ +---------+--------+--+--------+-+---------+ VertebralPSV cm/s42EDV cm/s8Antegrade +---------+--------+--+--------+-+---------+  Left Carotid Findings: +----------+--------+--------+--------+------------------+--------+           PSV cm/sEDV cm/sStenosisPlaque DescriptionComments +----------+--------+--------+--------+------------------+--------+ CCA Prox  43                      heterogenous               +----------+--------+--------+--------+------------------+--------+ CCA Distal37                      heterogenous               +----------+--------+--------+--------+------------------+--------+ ICA Prox  54      10      1-39%   heterogenous               +----------+--------+--------+--------+------------------+--------+ ICA Distal52      12                                         +----------+--------+--------+--------+------------------+--------+ ECA       71                                                 +----------+--------+--------+--------+------------------+--------+ +----------+--------+--------+--------+-------------------+           PSV cm/sEDV cm/sDescribeArm Pressure (mmHG) +----------+--------+--------+--------+-------------------+ [id number]                                          +----------+--------+--------+--------+-------------------+ +---------+--------+--+--------+---------+ VertebralPSV cm/s35EDV cm/sAntegrade +---------+--------+--+--------+---------+   Summary: Right Carotid: Velocities in the right ICA are consistent with a 1-39% stenosis. Left  Carotid: Velocities in the left ICA are consistent with a 1-39% stenosis. Vertebrals: Bilateral vertebral arteries demonstrate antegrade flow. *See table(s) above for measurements and observations.  Electronically signed by Orlie Pollen on 02/04/2021 at 7:08:50 PM.    Final    Structural Heart Procedure  Result Date: 02/08/2021 Successful TAVR with placement of a 26 mm Edwards Sapien 3 THV from the left carotid artery. See full report in the Progress Notes section of the chart.   Disposition   Pt is being discharged home today in good condition.  Follow-up Plans & Appointments     Follow-up Information     Eileen Stanford, PA-C. Go on 02/16/2021.   Specialties: Cardiology, Radiology Why: @ 2pm, please arrive at least 10 minutes early Contact information: [address] [place] [address] [phone]         [facility] [address]  Go on 02/14/2021.   Specialty: Cardiology Why: @ 1:45pm, to check your INR. Contact information: [address] [id number] [place] [phone]                 Discharge Medications   Allergies as of 02/09/2021       Reactions   Other Rash   Allergen: "Plants and bushes while doing yard work"        Medication List     TAKE these medications    aspirin 81 MG chewable tablet Chew 1 tablet (81 mg total) by mouth daily. Swallow whole. Can discontinue with INR therapeutic.   BIOTIN PO Take 1 tablet by mouth every evening.   buPROPion ER 100 MG 12 hr tablet Commonly known as: Wellbutrin SR 2  qam   1  q diner What changed:  how much to take how to take this when to take this additional instructions   CALCIUM 1200 PO Take 1,200 mg by mouth every evening.   docusate sodium 100 MG capsule Commonly known as: COLACE Take 100 mg by mouth every evening.   ezetimibe 10 MG tablet Commonly known as: ZETIA Take 1 tablet (10 mg total) by  mouth daily. What changed: when to take this   Fish Oil 1000 MG Caps Take 1,000 mg by mouth daily with supper.   furosemide 20 MG tablet Commonly known as: Lasix Take 1 tablet (20 mg total) by mouth daily.   gabapentin 100 MG capsule Commonly known as: NEURONTIN 3  qhs What changed:  how much to take how to take this when to take this additional instructions   Incruse Ellipta 62.5 MCG/INH Aepb Generic drug: umeclidinium bromide Inhale 1 puff into the lungs daily.   ketoconazole 2 % shampoo Commonly known as: NIZORAL Apply 1 application topically 2 (two) times a week. What changed: when to take this   latanoprost 0.005 % ophthalmic solution Commonly known as: XALATAN Place 1 drop into the left eye every other day.   LYCOPENE PO Take 10 mg by mouth every other day.   Magnesium 250 MG Tabs Take 250 mg by mouth every 3 (three) days.   metFORMIN 500 MG tablet Commonly known as: GLUCOPHAGE Take 1 tablet (500 mg total) by mouth 2 (two) times daily with a meal. Notes to patient: Okay to resume Metformin after 48 hours after contrast dye exposure (9/22 PM)    metoprolol succinate 50 MG 24 hr tablet Commonly known as: TOPROL-XL Take 1 tablet (50 mg total) by mouth 2 (two) times daily. Take with or immediately following a meal.   PREVIDENT 5000 DRY MOUTH DT Place 1 application onto teeth See admin instructions. Apply to gums every night for dry mouth   rosuvastatin 20 MG tablet Commonly known as: Crestor Take 1 tablet (20 mg total) by mouth daily. What changed: when to take this   Tums Ultra 1000 400 MG chewable tablet Generic drug: calcium elemental as carbonate Chew 1,000 mg by mouth daily as needed for heartburn.   vitamin B-12 500 MCG tablet Commonly known as: CYANOCOBALAMIN Take 500 mcg by mouth every other day.   vitamin C 500 MG tablet Commonly known as: ASCORBIC ACID Take 500-1,000 mg by mouth See admin instructions. Alternate taking 500 mg one day and  1000 mg the next   Vitamin D (Ergocalciferol) 1.25 MG (50000 UNIT) Caps capsule Commonly known as: DRISDOL Take 50,000 Units by mouth every Saturday.   VITAMIN-B COMPLEX PO Take  1 tablet by mouth daily with supper.   warfarin 4 MG tablet Commonly known as: COUMADIN Take as directed. If you are unsure how to take this medication, talk to your nurse or doctor. Original instructions: TAKE 1/2 TO 1 TABLET DAILY AS DIRECTED BY COUMADIN CLINIC What changed: See the new instructions.          Outstanding Labs/Studies   none  Duration of Discharge Encounter   Greater than 30 minutes including physician time.  SignedAngelena Form, PA-C 02/09/2021, 9:57 AM [phone]

## 2021-02-10 ENCOUNTER — Telehealth: Payer: Self-pay | Admitting: Physician Assistant

## 2021-02-10 LAB — POCT ACTIVATED CLOTTING TIME
Activated Clotting Time: 144 seconds
Activated Clotting Time: 352 seconds
Activated Clotting Time: 115 seconds

## 2021-02-10 NOTE — Telephone Encounter (Signed)
Patient contacted regarding discharge from [facility] on 02/09/2021.  Patient understands to follow up with provider Nell Range PA-C on 02/16/2021 at 2:00 PM at [facility] location. Patient understands discharge instructions? yes Patient understands medications and regiment? yes Patient understands to bring all medications to this visit? yes  The pt was also made aware of 02/14/21 INR check.  At this time the pt denies any issues with surgical site.  I advised pt to monitor for signs of infection and he will contact the office with any other questions or concerns.

## 2021-02-10 NOTE — Telephone Encounter (Signed)
  [facility]   MULTIDISCIPLINARY HEART VALVE TEAM  Attempted TOC call. Left VM.   Rayhana Slider PA-C  [facility]     

## 2021-02-14 ENCOUNTER — Ambulatory Visit (INDEPENDENT_AMBULATORY_CARE_PROVIDER_SITE_OTHER): Payer: Medicare Other

## 2021-02-14 ENCOUNTER — Other Ambulatory Visit: Payer: Self-pay

## 2021-02-14 DIAGNOSIS — Z5181 Encounter for therapeutic drug level monitoring: Secondary | ICD-10-CM | POA: Diagnosis not present

## 2021-02-14 DIAGNOSIS — Z7901 Long term (current) use of anticoagulants: Secondary | ICD-10-CM

## 2021-02-14 DIAGNOSIS — I482 Chronic atrial fibrillation, unspecified: Secondary | ICD-10-CM | POA: Diagnosis not present

## 2021-02-14 LAB — POCT INR: INR: 2.4 (ref 2.0–3.0)

## 2021-02-14 NOTE — Patient Instructions (Signed)
continue to take warfarin 1 tablet daily except for 1/2 a tablet on Tuesdays and Fridays. Recheck INR in 6 weeks. [facility] [phone]

## 2021-02-16 ENCOUNTER — Encounter: Payer: Self-pay | Admitting: Physician Assistant

## 2021-02-16 ENCOUNTER — Ambulatory Visit (INDEPENDENT_AMBULATORY_CARE_PROVIDER_SITE_OTHER): Payer: Medicare Other | Admitting: Physician Assistant

## 2021-02-16 ENCOUNTER — Other Ambulatory Visit: Payer: Self-pay

## 2021-02-16 ENCOUNTER — Encounter: Payer: Self-pay | Admitting: Cardiology

## 2021-02-16 VITALS — BP 130/80 | HR 84 | Ht 68.0 in | Wt 200.6 lb

## 2021-02-16 DIAGNOSIS — Z952 Presence of prosthetic heart valve: Secondary | ICD-10-CM

## 2021-02-16 DIAGNOSIS — I10 Essential (primary) hypertension: Secondary | ICD-10-CM

## 2021-02-16 DIAGNOSIS — I350 Nonrheumatic aortic (valve) stenosis: Secondary | ICD-10-CM

## 2021-02-16 DIAGNOSIS — E118 Type 2 diabetes mellitus with unspecified complications: Secondary | ICD-10-CM | POA: Diagnosis not present

## 2021-02-16 DIAGNOSIS — I482 Chronic atrial fibrillation, unspecified: Secondary | ICD-10-CM

## 2021-02-16 DIAGNOSIS — Z7901 Long term (current) use of anticoagulants: Secondary | ICD-10-CM | POA: Diagnosis not present

## 2021-02-16 MED ORDER — AMOXICILLIN 500 MG PO TABS: ORAL_TABLET | ORAL | 4 refills | Status: DC

## 2021-02-16 MED ORDER — FUROSEMIDE 20 MG PO TABS: 20.0000 mg | ORAL_TABLET | Freq: Every day | ORAL | 3 refills | Status: DC | PRN

## 2021-02-16 NOTE — Patient Instructions (Signed)
Medication Instructions:  Your physician has recommended you make the following change in your medication:  1.) change furosemide (Lasix) to one tablet daily AS NEEDED for weight gain greater than 3 pounds over night or 5 pounds in a week  *If you need a refill on your cardiac medications before your next appointment, please call your pharmacy*   Lab Work: none   Testing/Procedures: none   Follow-Up: As planned  Other Instructions

## 2021-02-16 NOTE — Progress Notes (Signed)
[facility] [place]                                     Cardiology Office Note:    Date:  02/16/2021   ID:  Rodney Friedman, DOB 01-Apr-1946, MRN [id number]  PCP:  Kristie Cowman, MD  [facility] Cardiologist:  Sanda Klein, MD/ Dr. Angelena Form & Dr. Cyndia Bent (TAVR) [facility] Electrophysiologist:  None   Referring MD: Kristie Cowman, MD   Chief Complaint  Patient presents with   Hospitalization Follow-up   s/p TAVR   History of Present Illness:    Rodney Friedman is a 75 y.o. male with a hx of CAD s/p 3V CABG in 2006, paroxysmal atrial fibrillation on Coumadin, carotid artery disease, HTN, HLD, DM type 2, former etoh abuse (stopped drinking etoh in 2002), former tobacco abuse (stopped July 2022), CLL, sleep apnea and severe aortic stenosis s/p TAVR who presents today for TOC follow up.    He has had moderate aortic stenosis by previous echocardiogram and has been followed by Dr. Sallyanne Kuster. He was hospitalized with pneumonia in July 2022 which may have been congestive heart failure and since then has had increasing shortness of breath on exertion. Echocardiogram on 12/17/2020 showed a thickened and calcified aortic valve with reduced leaflet mobility. The mean gradient had increased to 33.5 mmHg with a peak gradient of 57.5 mmHg. Aortic valve area was 0.59 cm with a dimensionless index of 0.16. Stroke-volume index was 27.  Left ventricular ejection fraction was 60 to 65%. His gated cardiac CT showed a calcium score of 3000 consistent with severe aortic stenosis. L/RHC on 01/20/21 showed severe triple vessel CAD s/p 3V CABG with 3/3 patent bypass grafts. There was severe aortic stenosis by echo (by cath the mean gradient is 32 mmhg, peak to peak gradient is 38 mmHg, AVA 1.0 cm2). He was admitted after his cath for shortness of breath and diuresis. He was then readmitted 9/5-01/25/21 for acute CHF in the setting of missing some medications.    He was  evaluated by the multidisciplinary valve team and felt to be a suitable candidate for TAVR. He underwent successful TAVR with a 26 mm Edwards Sapien 3 Ultra THV via the left carotid approach on 02/08/21. Post operative echo completed which showed trivial central AR post TAVR with 26 mm Sapien 3 valve mean gradient 13 peak 24 mmHG AVA 3.6 cm2 DVI 0.93. There is a 26 mm Sapien prosthetic (TAVR) valve present in the aortic position. Severe degenerative MR>better seen with TEE likely ischemic with severe MAC and restricted posterior leaflet motion (does not appear to be clippable). He had no complications with his carotid site and had no high grade heart block on EKG/telemetry. Resumed on home Coumadin with the addition of a baby Asprin with plans to stop after therapeutic INR. Last INR noted 02/14/21 at 2.4 and he was instructed to stop his ASA which I confirmed that he did.   Today he is here alone and reports that he is feeling fantastic. He states that he is no longer SOB and is able to perform activities without issues. His swelling is improved. We will change his dosing to PRN for 3lb in one day or 5lb in one week. Carotid site with no redness, oozing, or swelling. Denies chest pain, orthopnea, dizziness, palpitations, or pre-syncope.   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 Pt says it causes abnormal EKGs   HLD (hyperlipidemia)    Hypertension    Leukemia ([place])    CLL   Myocardial infarction [facility]) 2006   Prostate cancer  ([place]) 07/08/2013   [facility]- getting Lupron shot every 6 months - traces in prostate and 2 lymphnodes left hip- non focused traces per pt    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   s/p TAVR with a 48m Edwards S3U via the TF approach by Dr. MAngelena Form& Dr. BCyndia Bent  Sleep apnea    Substance abuse ([place]    Tremor, essential 09/22/2015    Past Surgical History:  Procedure Laterality Date   Arm Surgery Right    from door accident with glass   CARDIAC CATHETERIZATION     CATARACT EXTRACTION W/ INTRAOCULAR LENS IMPLANT Bilateral    COLONOSCOPY     CORONARY ARTERY BYPASS GRAFT  08/04/2004   EYE SURGERY     POLYPECTOMY     RIGHT/LEFT HEART CATH AND CORONARY/GRAFT ANGIOGRAPHY N/A 01/20/2021   Procedure: RIGHT/LEFT HEART CATH AND CORONARY/GRAFT ANGIOGRAPHY;  Surgeon: MBurnell Blanks MD;  [facility];  Service: Cardiovascular;  Laterality: N/A;   TEE WITHOUT CARDIOVERSION N/A 02/08/2021   Procedure: TRANSESOPHAGEAL ECHOCARDIOGRAM (TEE);  Surgeon: MBurnell Blanks MD;  [facility];  Service: Open Heart Surgery;  Laterality: N/A;   TRANSCATHETER AORTIC VALVE REPLACEMENT, CAROTID Left 02/08/2021   Procedure: TRANSCATHETER AORTIC VALVE REPLACEMENT, LEFT CAROTID;  Surgeon: MBurnell Blanks MD;  [facility];  Service: Open Heart Surgery;  Laterality: Left;    Current Medications: Current Meds  Medication Sig   amoxicillin (AMOXIL) 500 MG tablet Take 4 tablets (2000 mg) by mouth ONE HOUR before any dental procedures.   B Complex Vitamins (VITAMIN-B COMPLEX PO) Take 1 tablet by mouth daily with supper.   BIOTIN PO Take 1 tablet by mouth every evening.   buPROPion ER (WELLBUTRIN SR) 100 MG 12 hr tablet 2  qam   1  q diner (Patient taking differently: 2  qam   1  q diner)   Calcium Carbonate-Vit D-Min (CALCIUM 1200 PO) Take 1,200 mg by mouth every evening.   calcium elemental as carbonate  (TUMS ULTRA 1000) 400 MG chewable tablet Chew 1,000 mg by mouth daily as needed for heartburn.   docusate sodium (COLACE) 100 MG capsule Take 100 mg by mouth every evening.   ezetimibe (ZETIA) 10 MG tablet Take 1 tablet (10 mg total) by mouth daily.   gabapentin (NEURONTIN) 100 MG capsule 3  qhs (Patient taking differently: 3  qhs)   ketoconazole (NIZORAL) 2 % shampoo Apply 1 application topically 2 (two) times a week.   latanoprost (XALATAN) 0.005 % ophthalmic solution Place 1 drop into the left eye every other day.   LYCOPENE PO Take 10 mg by mouth every  other day.   Magnesium 250 MG TABS Take 250 mg by mouth every 3 (three) days.   metFORMIN (GLUCOPHAGE) 500 MG tablet Take 1 tablet (500 mg total) by mouth 2 (two) times daily with a meal.   metoprolol (TOPROL-XL) 50 MG 24 hr tablet Take 1 tablet (50 mg total) by mouth 2 (two) times daily. Take with or immediately following a meal.   Omega-3 Fatty Acids (FISH OIL) 1000 MG CAPS Take 1,000 mg by mouth daily with supper.   rosuvastatin (CRESTOR) 20 MG tablet Take 1 tablet (20 mg total) by mouth daily.   Sodium Fluoride (PREVIDENT 5000 DRY MOUTH DT) Place 1 application onto teeth See admin instructions. Apply to gums every night for dry mouth   vitamin B-12 (CYANOCOBALAMIN) 500 MCG tablet Take 500 mcg by mouth every other day.   vitamin C (ASCORBIC ACID) 500 MG tablet Take 500-1,000 mg by mouth See admin instructions. Alternate taking 500 mg one day and 1000 mg the next   Vitamin D, Ergocalciferol, (DRISDOL) 1.25 MG (50000 UNIT) CAPS capsule Take 50,000 Units by mouth every Saturday.   warfarin (COUMADIN) 4 MG tablet TAKE 1/2 TO 1 TABLET DAILY AS DIRECTED BY COUMADIN CLINIC   [DISCONTINUED] furosemide (LASIX) 20 MG tablet Take 1 tablet (20 mg total) by mouth daily.     Allergies:   Other   Social History   Socioeconomic History   Marital status: Divorced    Spouse name: Not on file   Number of children: 2   Years of education: 16   Highest  education level: Not on file  Occupational History   Occupation: Retired Pharmacist, hospital  Tobacco Use   Smoking status: Former    Packs/day: 0.50    Years: 50.00    Pack years: 25.00    Types: Cigarettes    Quit date: 12/16/2020    Years since quitting: 0.1   Smokeless tobacco: Never  Vaping Use   Vaping Use: Never used  Substance and Sexual Activity   Alcohol use: No    Alcohol/week: 0.0 standard drinks   Drug use: No   Sexual activity: Not Currently  Other Topics Concern   Not on file  Social History Narrative   Lives alone   Divorced   Right-handed   Education: College   No caffeine   Social Determinants of Health   Financial Resource Strain: Not on file  Food Insecurity: Not on file  Transportation Needs: Not on file  Physical Activity: Not on file  Stress: Not on file  Social Connections: Not on file     Family History: The patient's family history includes Colon cancer in his mother; Hypertension in his father; Ovarian cancer in his mother; Prostate cancer in his father; Uterine cancer in his mother. There is no history of Colon polyps.  ROS:   Please see the history of present illness.    All other systems reviewed and are negative.  EKGs/Labs/Other Studies Reviewed:    The following studies were reviewed today:  TAVR OPERATIVE NOTE     Date of Procedure:                02/08/2021   Preoperative Diagnosis:      Severe Aortic Stenosis    Postoperative Diagnosis:    Same    Procedure:        Transcatheter Aortic Valve Replacement - Left Common Carotid Artery Approach             Edwards Sapien 3  Ultra THV (size 26 mm, model # 9750TFX, serial # [id number])              Co-Surgeons:                        Gaye Pollack, MD and  Lauree Chandler, MD     Anesthesiologist:                  Gennie Alma, MD   Echocardiographer:              Edmonia James, Md   Pre-operative Echo Findings: Severe aortic stenosis Severe aortic insufficiency Severe mitral  regurgitation Normal left ventricular systolic function   Post-operative Echo Findings: Trivial paravalvular leak Unchanged severe mitral regurgitation Normal left ventricular systolic function   _____________    Echo 02/09/21:  Inferior basal hypokinesis . Left ventricular ejection fraction, by  estimation, is 45 to 50%. The left ventricle has mildly decreased  function. The left ventricle demonstrates regional wall motion  abnormalities (see scoring diagram/findings for  description). There is moderate left ventricular hypertrophy. Left  ventricular diastolic function could not be evaluated.   2. Right ventricular systolic function is normal. The right ventricular  size is normal. There is mildly elevated pulmonary artery systolic  pressure.   3. Left atrial size was mildly dilated.   4. Degree of MR better seen with TEE on previous day likely ischemic/  with severe MAC and restricted posterior leaflet motion Would not appear  to be clippable . The mitral valve is degenerative. Severe mitral valve  regurgitation. Severe mitral annular  calcification.   5. Trivial ? central AR post TAVR with 26 mm Sapien 3 valve mean gradient  13 peak 24 mmHG AVA 3.6 cm2 DVI 0.93. The aortic valve has been  repaired/replaced. Aortic valve regurgitation is trivial. There is a 26 mm  Sapien prosthetic (TAVR) valve present  in the aortic position. Procedure Date: 02/08/21.   EKG:  EKG is ordered today.  The ekg ordered today demonstrates AF with HR 84bpm, IVCD (old)   Recent Labs: 01/24/2021: B Natriuretic Peptide 672.5 02/04/2021: ALT 49 02/09/2021: BUN 21; Creatinine, Ser 1.34; Hemoglobin 11.9; Magnesium 1.8; Platelets 151; Potassium 3.9; Sodium 140  Recent Lipid Panel    Component Value Date/Time   CHOL 119 04/26/2020 0941   TRIG 120 04/26/2020 0941   HDL 37 (L) 04/26/2020 0941   CHOLHDL 3.2 04/26/2020 0941   CHOLHDL 5.8 (H) 03/10/2016 0830   VLDL 41 (H) 03/10/2016 0830   LDLCALC 60  04/26/2020 0941    Physical Exam:    VS:  BP 130/80   Pulse 84   Ht _0  (1.727 m)   Wt 200 lb 9.6 oz (91 kg)   SpO2 98%   BMI 30.50 kg/m     Wt Readings from Last 3 Encounters:  02/16/21 200 lb 9.6 oz (91 kg)  02/09/21 200 lb 8 oz (90.9 kg)  02/04/21 199 lb 12.8 oz (90.6 kg)    General: Well developed, well nourished, NAD Neck: Negative for carotid bruits. No JVD. L carotid site stable Lungs:Clear to ausculation bilaterally. Breathing is unlabored Cardiovascular: Irregularly irregular. Soft murmur present  Extremities: No edema.  Neuro: Alert and oriented. No focal deficits. No facial asymmetry. MAE spontaneously. Psych: Responds to questions appropriately with normal affect.    ASSESSMENT/Plan:    1. Severe AS: s/p successful TAVR with a 26 mm Edwards Sapien 3 Ultra  THV via  the left carotid approach on 02/08/21. Post operative echo completed which showed  -Carotid site stable with no oozing, redness or swelling  -ECG with afib with old IVCD and no high grade block. -INR on 02/14/21 at 2.4 therefore ASA was stopped and he has been continued on Coumadin.  -SBE prophylaxis given; Amoxicillin 2g one hour prior to dental procedures. Reviewed with patient    2. Chronic atrial fibrillation: Rates stable on EKG today, continue Toprol XL and Coumadin.   3. CAD s/p CABG: pre TAVR cath showed 3/3 patent bypass grafts and severe native artery disease. Continue Toprol and statin. No chest pain    4. DM2: Continue Metformin. Follow with PCP    5. Chronic diastolic CHF: Appears euvolemic on exam. Change Lasix to PRN dosing for 3lb in one day or 5lb in one week. If using more than three times per week, patient to call to inform our team.   6. HTN: Stable, 130/80, no changes today   Medication Adjustments/Labs and Tests Ordered: Current medicines are reviewed at length with the patient today.  Concerns regarding medicines are outlined above.  Orders Placed This Encounter  Procedures    EKG 12-Lead   Meds ordered this encounter  Medications   furosemide (LASIX) 20 MG tablet    Sig: Take 1 tablet (20 mg total) by mouth daily as needed (weight gain of 3 pounds or more overnight or 5 pounds in a week).    Dispense:  90 tablet    Refill:  3   amoxicillin (AMOXIL) 500 MG tablet    Sig: Take 4 tablets (2000 mg) by mouth ONE HOUR before any dental procedures.    Dispense:  12 tablet    Refill:  4    Patient Instructions  Medication Instructions:  Your physician has recommended you make the following change in your medication:  1.) change furosemide (Lasix) to one tablet daily AS NEEDED for weight gain greater than 3 pounds over night or 5 pounds in a week  *If you need a refill on your cardiac medications before your next appointment, please call your pharmacy*   Lab Work: none   Testing/Procedures: none   Follow-Up: As planned  Other Instructions     Signed, Kathyrn Drown, NP  02/16/2021 3:05 PM    [place]  Seen with Kathyrn Drown who wrote note and agree with findings.   Angelena Form PA-C  [facility]

## 2021-02-16 NOTE — Progress Notes (Deleted)
[place]                                     Cardiology Office Note:    Date:  02/16/2021   ID:  Rodney Friedman, DOB 12-12-1945, MRN [id number]  PCP:  Kristie Cowman, MD  [facility] Cardiologist:  Dr. Angelena Form & Dr. Cyndia Bent (TAVR)  [place] Electrophysiologist:  None   Referring MD: No ref. provider found   No chief complaint on file. ***  History of Present Illness:    Rodney Friedman is a 75 y.o. 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 Pt says it causes abnormal EKGs   HLD (hyperlipidemia)    Hypertension    Leukemia ([place])    CLL   Myocardial infarction [facility]) 2006   Prostate cancer ([place]) 07/08/2013   [facility]- getting Lupron shot every 6 months - traces in prostate and 2 lymphnodes left hip- non focused traces per pt    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   s/p TAVR with a 60mm Edwards S3U via the TF approach by Dr. Angelena Form & Dr. Cyndia Bent   Sleep apnea    Substance abuse ([place])    Tremor, essential 09/22/2015    Past Surgical History:  Procedure Laterality Date   Arm Surgery Right    from door accident with glass   CARDIAC CATHETERIZATION     CATARACT EXTRACTION W/ INTRAOCULAR LENS IMPLANT Bilateral    COLONOSCOPY     CORONARY ARTERY BYPASS GRAFT  08/04/2004   EYE SURGERY     POLYPECTOMY     RIGHT/LEFT HEART CATH AND CORONARY/GRAFT ANGIOGRAPHY N/A 01/20/2021    Procedure: RIGHT/LEFT HEART CATH AND CORONARY/GRAFT ANGIOGRAPHY;  Surgeon: Burnell Blanks, MD;  Location: [place] [facility];  Service: Cardiovascular;  Laterality: N/A;   TEE WITHOUT CARDIOVERSION N/A 02/08/2021   Procedure: TRANSESOPHAGEAL ECHOCARDIOGRAM (TEE);  Surgeon: Burnell Blanks, MD;  Location: [place] [facility];  Service: Open Heart Surgery;  Laterality: N/A;   TRANSCATHETER AORTIC VALVE REPLACEMENT, CAROTID Left 02/08/2021   Procedure: TRANSCATHETER AORTIC VALVE REPLACEMENT, LEFT CAROTID;  Surgeon: Burnell Blanks, MD;  Location: [place] [facility];  Service: Open Heart Surgery;  Laterality: Left;    Current Medications: No outpatient medications have been marked as taking for the 02/16/21 encounter (Letter (Out)) with Tommie Raymond, NP.     Allergies:   Other   Social History   Socioeconomic History   Marital status: Divorced    Spouse name: Not on file   Number of children: 2   Years of education: 16   Highest education level: Not on file  Occupational History   Occupation: Retired Pharmacist, hospital  Tobacco Use  Smoking status: Former    Packs/day: 0.50    Years: 50.00    Pack years: 25.00    Types: Cigarettes    Quit date: 12/16/2020    Years since quitting: 0.1   Smokeless tobacco: Never  Vaping Use   Vaping Use: Never used  Substance and Sexual Activity   Alcohol use: No    Alcohol/week: 0.0 standard drinks   Drug use: No   Sexual activity: Not Currently  Other Topics Concern   Not on file  Social History Narrative   Lives alone   Divorced   Right-handed   Education: College   No caffeine   Social Determinants of Health   Financial Resource Strain: Not on file  Food Insecurity: Not on file  Transportation Needs: Not on file  Physical Activity: Not on file  Stress: Not on file  Social Connections: Not on file     Family History: The patient's ***family history includes Colon cancer in his mother; Hypertension in his father;  Ovarian cancer in his mother; Prostate cancer in his father; Uterine cancer in his mother. There is no history of Colon polyps.  ROS:   Please see the history of present illness.    All other systems reviewed and are negative.  EKGs/Labs/Other Studies Reviewed:    The following studies were reviewed today: ***  EKG:  EKG is *** ordered today.  The ekg ordered today demonstrates ***  Recent Labs: 01/24/2021: B Natriuretic Peptide 672.5 02/04/2021: ALT 49 02/09/2021: BUN 21; Creatinine, Ser 1.34; Hemoglobin 11.9; Magnesium 1.8; Platelets 151; Potassium 3.9; Sodium 140  Recent Lipid Panel    Component Value Date/Time   CHOL 119 04/26/2020 0941   TRIG 120 04/26/2020 0941   HDL 37 (L) 04/26/2020 0941   CHOLHDL 3.2 04/26/2020 0941   CHOLHDL 5.8 (H) 03/10/2016 0830   VLDL 41 (H) 03/10/2016 0830   LDLCALC 60 04/26/2020 0941     Risk Assessment/Calculations:   {Does this patient have ATRIAL FIBRILLATION?:[phone]}   Physical Exam:    VS:  There were no vitals taken for this visit.    Wt Readings from Last 3 Encounters:  02/16/21 200 lb 9.6 oz (91 kg)  02/09/21 200 lb 8 oz (90.9 kg)  02/04/21 199 lb 12.8 oz (90.6 kg)     GEN: *** Well nourished, well developed in no acute distress HEENT: Normal NECK: No JVD; No carotid bruits LYMPHATICS: No lymphadenopathy CARDIAC: ***RRR, no murmurs, rubs, gallops RESPIRATORY:  Clear to auscultation without rales, wheezing or rhonchi  ABDOMEN: Soft, non-tender, non-distended MUSCULOSKELETAL:  No edema; No deformity  SKIN: Warm and dry NEUROLOGIC:  Alert and oriented x 3 PSYCHIATRIC:  Normal affect   ASSESSMENT:    No diagnosis found. PLAN:    In order of problems listed above:       {Are you ordering a CV Procedure (e.g. stress test, cath, DCCV, TEE, etc)?   Press F2        :[id number]}    Medication Adjustments/Labs and Tests Ordered: Current medicines are reviewed at length with the patient today.  Concerns regarding  medicines are outlined above.  No orders of the defined types were placed in this encounter.  No orders of the defined types were placed in this encounter.   There are no Patient Instructions on file for this visit.   Signed, Kathyrn Drown, NP  02/16/2021 3:01 PM    [facility]

## 2021-02-22 ENCOUNTER — Telehealth: Payer: Self-pay | Admitting: Cardiovascular Disease

## 2021-02-22 NOTE — Telephone Encounter (Signed)
   [facility] Pre-operative Risk Assessment    Request for surgical clearance:  What type of surgery is being performed?  Periodontal Maintenance     When is this surgery scheduled?  02/23/21   What type of clearance is required (medical clearance vs. Pharmacy clearance to hold med vs. Both)?  Both   Are there any medications that need to be held prior to surgery and how long? They are requesting out recommendation, if the patient is on any blood thinners. They are also recommending a pre-med, requesting that we provide it. They are also inquiring whether or not patient is on any bisphosphonate therapy that we are aware of.   Practice name and name of physician performing surgery?   [facility]  Dr. Oleta Mouse, DDS  What is your office phone number?  [phone]  7.   What is your office fax number? [phone] 8.   Anesthesia type (None, local, MAC, general) ?  They don't plan on using anesthesia, but if needed local    Zara Council 02/22/2021, 12:24 PM

## 2021-02-22 NOTE — Telephone Encounter (Signed)
Pt is having Gingival Scaling, which is more detailed than normal cleaning and the instruments will go down in the gum more than the usual.  Pt will not have to hold Coumadin, per Robin.  Pt already has Amoxicillin to take prior to dental procedures that Bonney Leitz, PA-C gave last week.

## 2021-02-22 NOTE — Telephone Encounter (Signed)
Preoperative team, please contact requesting office and ask for specific details surrounding procedure.  We are not able to provide blanket coverage.  Once we have established the details surrounding procedure we will able to better provide guidance/recommendations related to cardiac risk.  Thank you,  Jossie Ng. Jossette Zirbel NP-C    02/22/2021, 12:49 PM [place] [address] Office [phone] Fax [phone]

## 2021-02-22 NOTE — Telephone Encounter (Signed)
   Primary Cardiologist: Sanda Klein, MD  Chart reviewed as part of pre-operative protocol coverage. Simple dental extractions are considered low risk procedures per guidelines and generally do not require any specific cardiac clearance. It is also generally accepted that for simple extractions and dental cleanings, there is no need to interrupt blood thinner therapy.   SBE prophylaxis is required for the patient.  He has a prescription for amoxicillin and should take 2 g 1 hour prior to his procedure.  This patients medication list does not designate that he is taking biphosphonate therapy.  I will route this recommendation to the requesting party via Epic fax function and remove from pre-op pool.  Please call with questions.  Deberah Pelton, NP 02/22/2021, 1:30 PM

## 2021-03-03 DIAGNOSIS — E119 Type 2 diabetes mellitus without complications: Secondary | ICD-10-CM | POA: Diagnosis not present

## 2021-03-03 DIAGNOSIS — E789 Disorder of lipoprotein metabolism, unspecified: Secondary | ICD-10-CM | POA: Diagnosis not present

## 2021-03-03 DIAGNOSIS — E559 Vitamin D deficiency, unspecified: Secondary | ICD-10-CM | POA: Diagnosis not present

## 2021-03-03 DIAGNOSIS — F17210 Nicotine dependence, cigarettes, uncomplicated: Secondary | ICD-10-CM | POA: Diagnosis not present

## 2021-03-03 DIAGNOSIS — Z8546 Personal history of malignant neoplasm of prostate: Secondary | ICD-10-CM | POA: Diagnosis not present

## 2021-03-03 DIAGNOSIS — I10 Essential (primary) hypertension: Secondary | ICD-10-CM | POA: Diagnosis not present

## 2021-03-03 DIAGNOSIS — Z6830 Body mass index (BMI) 30.0-30.9, adult: Secondary | ICD-10-CM | POA: Diagnosis not present

## 2021-03-09 NOTE — Progress Notes (Signed)
HEART AND [place]                                     Cardiology Office Note:    Date:  03/10/2021   ID:  Rodney Friedman, DOB Oct 16, 1945, MRN [id number]  PCP:  Kristie Cowman, MD  [facility] Cardiologist:  Sanda Klein, MD/ Dr. Angelena Form & Dr. Cyndia Bent (TAVR) [facility] Electrophysiologist:  None   Referring MD: Kristie Cowman, MD   1 month s/p TAVR  History of Present Illness:    Rodney Friedman is a 75 y.o. male with a hx of CAD s/p 3V CABG in 2006, paroxysmal atrial fibrillation on Coumadin, carotid artery disease, HTN, HLD, DM type 2, former etoh abuse (stopped drinking etoh in 2002), former tobacco abuse (stopped July 2022), CLL, sleep apnea and severe aortic stenosis s/p TAVR who presents today for follow up.    He has had moderate aortic stenosis by previous echocardiogram and has been followed by Dr. Sallyanne Kuster. He was hospitalized with pneumonia in July 2022 which may have been congestive heart failure and since then has had increasing shortness of breath on exertion. Echocardiogram on 12/17/2020 showed a thickened and calcified aortic valve with reduced leaflet mobility. The mean gradient had increased to 33.5 mmHg with a peak gradient of 57.5 mmHg. Aortic valve area was 0.59 cm with a dimensionless index of 0.16. Stroke-volume index was 27.  Left ventricular ejection fraction was 60 to 65%. His gated cardiac CT showed a calcium score of 3000 consistent with severe aortic stenosis. L/RHC on 01/20/21 showed severe triple vessel CAD s/p 3V CABG with 3/3 patent bypass grafts. There was severe aortic stenosis by echo (by cath the mean gradient is 32 mmhg, peak to peak gradient is 38 mmHg, AVA 1.0 cm2). He was admitted after his cath for shortness of breath and diuresis. He was then readmitted 9/5-01/25/21 for acute CHF in the setting of missing some medications.    He was evaluated by the multidisciplinary valve team and felt to be a  suitable candidate for TAVR. He underwent successful TAVR with a 26 mm Edwards Sapien 3 Ultra THV via the left carotid approach on 02/08/21. Post operative echo showed EF 45-50%, trivial central AR with a normally functioning TAVR with a mean gradient 13 mm hg as well as severe degenerative MR>better seen with TEE likely ischemic with severe MAC and restricted posterior leaflet motion (does not appear to be clippable). Resumed on home Coumadin with the addition of a baby Asprin with plans to stop after therapeutic INR. He has had a big clinical improvement since TAVR. At last visit his Lasix was changed to PRN.   Today the patient presents to clinic for follow up. He has had the slightest tightness in his chest but nothing like previous to prior TAVR. No CP or SOB. No LE edema, orthopnea or PND. No dizziness or syncope. No blood in stool or urine. No palpitations. 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 Pt says it causes abnormal EKGs   HLD (hyperlipidemia)    Hypertension    Leukemia ([place])    CLL   Myocardial infarction [facility]) 2006   Prostate cancer ([place]) 07/08/2013   [facility] at [facility]- getting Lupron shot every 6 months - traces in prostate and 2 lymphnodes left hip- non focused traces per pt    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   s/p TAVR with a 35m Edwards S3U via the TF approach by Dr. MAngelena Form& Dr. BCyndia Bent  Sleep apnea    Substance abuse ([place]     Tremor, essential 09/22/2015    Past Surgical History:  Procedure Laterality Date   Arm Surgery Right    from door accident with glass   CARDIAC CATHETERIZATION     CATARACT EXTRACTION W/ INTRAOCULAR LENS IMPLANT Bilateral    COLONOSCOPY     CORONARY ARTERY BYPASS GRAFT  08/04/2004   EYE SURGERY     POLYPECTOMY     RIGHT/LEFT HEART CATH AND CORONARY/GRAFT ANGIOGRAPHY N/A 01/20/2021   Procedure: RIGHT/LEFT HEART CATH AND CORONARY/GRAFT ANGIOGRAPHY;  Surgeon: MBurnell Blanks MD;  [facility];  Service: Cardiovascular;  Laterality: N/A;   TEE WITHOUT CARDIOVERSION N/A 02/08/2021   Procedure: TRANSESOPHAGEAL ECHOCARDIOGRAM (TEE);  Surgeon: MBurnell Blanks MD;  [facility];  Service: Open Heart Surgery;  Laterality: N/A;   TRANSCATHETER AORTIC VALVE REPLACEMENT, CAROTID Left 02/08/2021   Procedure: TRANSCATHETER AORTIC VALVE REPLACEMENT, LEFT CAROTID;  Surgeon: MBurnell Blanks MD;  [facility];  Service: Open Heart Surgery;  Laterality: Left;    Current Medications: Current Meds  Medication Sig   amoxicillin (AMOXIL) 500 MG tablet Take 4 tablets (2000 mg) by mouth ONE HOUR before any dental procedures.   B Complex Vitamins (VITAMIN-B COMPLEX PO) Take 1 tablet by mouth daily with supper.   BIOTIN PO Take 1 tablet by mouth every evening.   buPROPion ER (WELLBUTRIN SR) 100 MG 12 hr tablet 2  qam   1  q diner (Patient taking differently: 2  qam   1  q diner)   Calcium Carbonate-Vit D-Min (CALCIUM 1200 PO) Take 1,200 mg by mouth every evening.   calcium elemental as carbonate (TUMS ULTRA 1000) 400 MG chewable tablet Chew 1,000 mg by mouth daily as needed for heartburn.   docusate sodium (COLACE) 100 MG capsule Take 100 mg by mouth every evening.   ezetimibe (ZETIA) 10 MG tablet Take 1 tablet (10 mg total) by mouth daily.   furosemide (LASIX) 20 MG tablet Take 1 tablet (20 mg total) by mouth daily as needed (weight  gain of 3 pounds or more overnight or 5 pounds in a week).   gabapentin (NEURONTIN) 100 MG capsule 3  qhs (Patient taking differently: 3  qhs)   ketoconazole (NIZORAL) 2 % shampoo Apply 1 application topically 2 (two) times a week.   latanoprost (XALATAN) 0.005 % ophthalmic solution Place 1 drop into the left eye every other day.   LYCOPENE PO Take 10 mg by mouth every other day.   Magnesium 250 MG TABS Take 250 mg by mouth every 3 (three) days.   metFORMIN (GLUCOPHAGE) 500 MG tablet Take 1 tablet (500  mg total) by mouth 2 (two) times daily with a meal.   metoprolol (TOPROL-XL) 50 MG 24 hr tablet Take 1 tablet (50 mg total) by mouth 2 (two) times daily. Take with or immediately following a meal.   Omega-3 Fatty Acids (FISH OIL) 1000 MG CAPS Take 1,000 mg by mouth daily with supper.   rosuvastatin (CRESTOR) 20 MG tablet Take 1 tablet (20 mg total) by mouth daily.   Sodium Fluoride (PREVIDENT 5000 DRY MOUTH DT) Place 1 application onto teeth See admin instructions. Apply to gums every night for dry mouth   umeclidinium bromide (INCRUSE ELLIPTA) 62.5 MCG/INH AEPB Inhale 1 puff into the lungs daily.   vitamin B-12 (CYANOCOBALAMIN) 500 MCG tablet Take 500 mcg by mouth every other day.   vitamin C (ASCORBIC ACID) 500 MG tablet Take 500-1,000 mg by mouth See admin instructions. Alternate taking 500 mg one day and 1000 mg the next   Vitamin D, Ergocalciferol, (DRISDOL) 1.25 MG (50000 UNIT) CAPS capsule Take 50,000 Units by mouth every Saturday.   warfarin (COUMADIN) 4 MG tablet TAKE 1/2 TO 1 TABLET DAILY AS DIRECTED BY COUMADIN CLINIC   [DISCONTINUED] aspirin 81 MG chewable tablet Chew 1 tablet (81 mg total) by mouth daily. Swallow whole. Can discontinue with INR therapeutic.     Allergies:   Other   Social History   Socioeconomic History   Marital status: Divorced    Spouse name: Not on file   Number of children: 2   Years of education: 16   Highest education level: Not on file  Occupational  History   Occupation: Retired Pharmacist, hospital  Tobacco Use   Smoking status: Former    Packs/day: 0.50    Years: 50.00    Pack years: 25.00    Types: Cigarettes    Quit date: 12/16/2020    Years since quitting: 0.2   Smokeless tobacco: Never  Vaping Use   Vaping Use: Never used  Substance and Sexual Activity   Alcohol use: No    Alcohol/week: 0.0 standard drinks   Drug use: No   Sexual activity: Not Currently  Other Topics Concern   Not on file  Social History Narrative   Lives alone   Divorced   Right-handed   Education: College   No caffeine   Social Determinants of Health   Financial Resource Strain: Not on file  Food Insecurity: Not on file  Transportation Needs: Not on file  Physical Activity: Not on file  Stress: Not on file  Social Connections: Not on file     Family History: The patient's family history includes Colon cancer in his mother; Hypertension in his father; Ovarian cancer in his mother; Prostate cancer in his father; Uterine cancer in his mother. There is no history of Colon polyps.  ROS:   Please see the history of present illness.    All other systems reviewed and are negative.  EKGs/Labs/Other Studies Reviewed:    The following studies were reviewed today:  TAVR OPERATIVE NOTE     Date of Procedure:                02/08/2021   Preoperative Diagnosis:      Severe Aortic Stenosis    Postoperative Diagnosis:    Same    Procedure:        Transcatheter Aortic Valve Replacement - Left Common Carotid Artery Approach             Edwards Sapien 3 Ultra THV (size 26 mm,  model # [id number], serial # [id number])              Co-Surgeons:                        Gaye Pollack, MD and  Lauree Chandler, MD     Anesthesiologist:                  Gennie Alma, MD   Echocardiographer:              Edmonia James, Md   Pre-operative Echo Findings: Severe aortic stenosis Severe aortic insufficiency Severe mitral regurgitation Normal left ventricular systolic  function   Post-operative Echo Findings: Trivial paravalvular leak Unchanged severe mitral regurgitation Normal left ventricular systolic function   _____________    Echo 02/09/21:  Inferior basal hypokinesis . Left ventricular ejection fraction, by  estimation, is 45 to 50%. The left ventricle has mildly decreased  function. The left ventricle demonstrates regional wall motion  abnormalities (see scoring diagram/findings for  description). There is moderate left ventricular hypertrophy. Left  ventricular diastolic function could not be evaluated.   2. Right ventricular systolic function is normal. The right ventricular  size is normal. There is mildly elevated pulmonary artery systolic  pressure.   3. Left atrial size was mildly dilated.   4. Degree of MR better seen with TEE on previous day likely ischemic/  with severe MAC and restricted posterior leaflet motion Would not appear  to be clippable . The mitral valve is degenerative. Severe mitral valve  regurgitation. Severe mitral annular  calcification.   5. Trivial ? central AR post TAVR with 26 mm Sapien 3 valve mean gradient  13 peak 24 mmHG AVA 3.6 cm2 DVI 0.93. The aortic valve has been  repaired/replaced. Aortic valve regurgitation is trivial. There is a 26 mm  Sapien prosthetic (TAVR) valve present  in the aortic position. Procedure Date: 02/08/21.   ___________________  Echo 03/10/21 IMPRESSIONS     1. Left ventricular ejection fraction, by estimation, is 50 to 55%. The  left ventricle has low normal function. The left ventricle demonstrates  regional wall motion abnormalities (see scoring diagram/findings for  description). There is mild left  ventricular hypertrophy. Left ventricular diastolic function could not be  evaluated. There is incoordinate septal motion. There is severe akinesis  of the left ventricular, entire inferoseptal wall and septal wall.   2. Right ventricular systolic function is moderately  reduced. The right  ventricular size is normal. There is normal pulmonary artery systolic  pressure.   3. Left atrial size was severely dilated.   4. Right atrial size was moderately dilated.   5. The mitral valve is abnormal. Moderate mitral valve regurgitation. No  evidence of mitral stenosis. Moderate to severe mitral annular  calcification.   6. The aortic valve has been repaired/replaced. Aortic valve  regurgitation is not visualized. There is a 23 mm Sapien prosthetic (TAVR)  valve present in the aortic position. Procedure Date: 02/08/2021. Echo  findings are consistent with normal structure  and function of the aortic valve prosthesis. Aortic valve area, by VTI  measures 1.81 cm. Aortic valve mean gradient measures 7.0 mmHg. Aortic  valve Vmax measures 1.82 m/s. Peak gradient 13 mmHg, DI 0.44.   7. Aortic dilatation noted. There is borderline dilatation of the aortic  root, measuring 38 mm.   8. The inferior vena cava is normal in size with greater than 50%  respiratory variability, suggesting  right atrial pressure of 3 mmHg.   Comparison(s): 02/08/2021: LVEF 45-50%, TAVR valve with mean gradient 13 mmHg.   EKG:  EKG is NOT ordered today.    Recent Labs: 01/24/2021: B Natriuretic Peptide 672.5 02/04/2021: ALT 49 02/09/2021: BUN 21; Creatinine, Ser 1.34; Hemoglobin 11.9; Magnesium 1.8; Platelets 151; Potassium 3.9; Sodium 140  Recent Lipid Panel    Component Value Date/Time   CHOL 119 04/26/2020 0941   TRIG 120 04/26/2020 0941   HDL 37 (L) 04/26/2020 0941   CHOLHDL 3.2 04/26/2020 0941   CHOLHDL 5.8 (H) 03/10/2016 0830   VLDL 41 (H) 03/10/2016 0830   LDLCALC 60 04/26/2020 0941    Physical Exam:    VS:  BP 130/70   Pulse 85   Ht _0  (1.727 m)   Wt 203 lb 12.8 oz (92.4 kg)   SpO2 100%   BMI 30.99 kg/m     Wt Readings from Last 3 Encounters:  03/10/21 203 lb 12.8 oz (92.4 kg)  02/16/21 200 lb 9.6 oz (91 kg)  02/09/21 200 lb 8 oz (90.9 kg)    General: Well  developed, well nourished, NAD Neck: . No JVD. Lungs:Clear to ausculation bilaterally. Breathing is unlabored Cardiovascular: Irregularly irregular. Soft murmur present  Extremities: No edema.  Neuro: Alert and oriented. No focal deficits. No facial asymmetry. MAE spontaneously. Psych: Responds to questions appropriately with normal affect.    ASSESSMENT/Plan:    Severe AS s/p TAVR: echo today showed EF 50-55%, normally functioning TAVR with a mean gradient of 7 mm hg and no PVL. Moderate to severe MAC with moderate MR. He has NYHA class I symptoms. SBE prophylaxis reviewed; Amoxicillin 2g one hour prior to dental procedures. Continue on long term Coumadin. I will see him back in 1 year with an echo.    Chronic atrial fibrillation: HR 85 bpm today. Continue Toprol XL and Coumadin.   CAD s/p CABG: pre TAVR cath showed 3/3 patent bypass grafts and severe native artery disease. Continue Toprol and statin. No aspirin with chronic coumadin. No chest pain    Chronic diastolic CHF: Appears euvolemic on exam. Change Lasix to PRN dosing for 3lb in one day or 5lb in one week. If using more than three times per week, patient to call to inform our team.   HTN: BP well controlled today. No changes made.   Medication Adjustments/Labs and Tests Ordered: Current medicines are reviewed at length with the patient today.  Concerns regarding medicines are outlined above.  No orders of the defined types were placed in this encounter.  No orders of the defined types were placed in this encounter.   Patient Instructions  Medication Instructions:  Your physician recommends that you continue on your current medications as directed. Please refer to the Current Medication list given to you today.  *If you need a refill on your cardiac medications before your next appointment, please call your pharmacy*   Lab Work: None ordered   If you have labs (blood work) drawn today and your tests are completely  normal, you will receive your results only by: [place] (if you have MyChart) OR A paper copy in the mail If you have any lab test that is abnormal or we need to change your treatment, we will call you to review the results.   Testing/Procedures: None ordered    Follow-Up: Follow up as scheduled    Other Instructions None     Signed, Angelena Form, PA-C  03/10/2021 4:09 PM  [place]  Seen with Kathyrn Drown who wrote note and agree with findings.   Angelena Form PA-C  [facility]

## 2021-03-10 ENCOUNTER — Encounter: Payer: Self-pay | Admitting: Physician Assistant

## 2021-03-10 ENCOUNTER — Other Ambulatory Visit: Payer: Self-pay

## 2021-03-10 ENCOUNTER — Ambulatory Visit (INDEPENDENT_AMBULATORY_CARE_PROVIDER_SITE_OTHER): Payer: Medicare Other | Admitting: Physician Assistant

## 2021-03-10 ENCOUNTER — Ambulatory Visit (HOSPITAL_COMMUNITY): Payer: Medicare Other | Attending: Internal Medicine

## 2021-03-10 VITALS — BP 130/70 | HR 85 | Ht 68.0 in | Wt 203.8 lb

## 2021-03-10 DIAGNOSIS — Z952 Presence of prosthetic heart valve: Secondary | ICD-10-CM

## 2021-03-10 DIAGNOSIS — I2510 Atherosclerotic heart disease of native coronary artery without angina pectoris: Secondary | ICD-10-CM

## 2021-03-10 DIAGNOSIS — I482 Chronic atrial fibrillation, unspecified: Secondary | ICD-10-CM | POA: Diagnosis not present

## 2021-03-10 DIAGNOSIS — I10 Essential (primary) hypertension: Secondary | ICD-10-CM

## 2021-03-10 DIAGNOSIS — I5032 Chronic diastolic (congestive) heart failure: Secondary | ICD-10-CM | POA: Diagnosis not present

## 2021-03-10 LAB — ECHOCARDIOGRAM COMPLETE
AR max vel: 1.87 cm2
AV Area VTI: 1.81 cm2
AV Area mean vel: 1.8 cm2
AV Mean grad: 7 mmHg
AV Peak grad: 13.3 mmHg
Ao pk vel: 1.82 m/s
Area-P 1/2: 2.76 cm2
MV M vel: 5.78 m/s
MV Peak grad: 133.6 mmHg
MV VTI: 1.74 cm2
Radius: 0.5 cm
S' Lateral: 3.9 cm

## 2021-03-10 NOTE — Patient Instructions (Signed)

## 2021-03-15 ENCOUNTER — Other Ambulatory Visit: Payer: Self-pay

## 2021-03-15 ENCOUNTER — Ambulatory Visit (INDEPENDENT_AMBULATORY_CARE_PROVIDER_SITE_OTHER): Payer: Medicare Other | Admitting: Licensed Clinical Social Worker

## 2021-03-15 DIAGNOSIS — F411 Generalized anxiety disorder: Secondary | ICD-10-CM | POA: Diagnosis not present

## 2021-03-15 DIAGNOSIS — F320 Major depressive disorder, single episode, mild: Secondary | ICD-10-CM

## 2021-03-16 ENCOUNTER — Other Ambulatory Visit: Payer: Self-pay | Admitting: Cardiovascular Disease

## 2021-03-16 ENCOUNTER — Encounter (HOSPITAL_COMMUNITY): Payer: Self-pay | Admitting: Licensed Clinical Social Worker

## 2021-03-16 DIAGNOSIS — I25709 Atherosclerosis of coronary artery bypass graft(s), unspecified, with unspecified angina pectoris: Secondary | ICD-10-CM

## 2021-03-16 NOTE — Progress Notes (Signed)
   THERAPIST PROGRESS NOTE  Session Time: 9:00am-10:00am  Participation Level: Active  Behavioral Response: NeatAlertEuthymic  Type of Therapy: Individual Therapy  Treatment Goals addressed:  "to cope with stress and manage depression and anxiety". [name] will report improved stress management and coping skills, resulting in mood stability and less worrying 4 out of 7 days.   Interventions: Motivational Interviewing   Summary: Rodney Friedman is a 75 y.o. male who presents with Major Depressive Disorder, mild and Generalized Anxiety Disorder   Suicidal/Homicidal: Some thoughts without intent/plan   Therapist Response:   Rodney Friedman met with clinician for individual therapy. Rodney Friedman discussed his psychiatric symptoms and current life events. Rodney Friedman shared that he feels 15-20 years younger after having multiple heart interventions over the past several months. He shared the updates about the surgeries and his recovery. He also identified changes in his view of life, noting that he is more able to do yard work and other physical activities. He also identified improvement in his outlook for the future, although some anxiety about his future needs and care have arisen. Clinician utilized MI OARS to reflect and summarize thoughts and feelings. Clinician normalized these concerns and noted ongoing importance of planning and preparedness for the future. Clinician also encouraged joy and gratitude for his new lease on life and extension of his time with family and friends.      Plan: Return again in 2 weeks.   Diagnosis: Axis I: Major depressive disorder, mild and Generalized Anxiety Disorder     [name], LCSW 03/16/2021

## 2021-03-22 ENCOUNTER — Other Ambulatory Visit: Payer: Self-pay | Admitting: Cardiovascular Disease

## 2021-03-22 DIAGNOSIS — I25709 Atherosclerosis of coronary artery bypass graft(s), unspecified, with unspecified angina pectoris: Secondary | ICD-10-CM

## 2021-03-28 ENCOUNTER — Ambulatory Visit (INDEPENDENT_AMBULATORY_CARE_PROVIDER_SITE_OTHER): Payer: Medicare Other

## 2021-03-28 ENCOUNTER — Other Ambulatory Visit: Payer: Self-pay

## 2021-03-28 DIAGNOSIS — Z5181 Encounter for therapeutic drug level monitoring: Secondary | ICD-10-CM

## 2021-03-28 DIAGNOSIS — I482 Chronic atrial fibrillation, unspecified: Secondary | ICD-10-CM | POA: Diagnosis not present

## 2021-03-28 DIAGNOSIS — Z7901 Long term (current) use of anticoagulants: Secondary | ICD-10-CM

## 2021-03-28 LAB — POCT INR: INR: 2.3 (ref 2.0–3.0)

## 2021-03-28 NOTE — Patient Instructions (Signed)
continue to take warfarin 1 tablet daily except for 1/2 a tablet on Tuesdays and Fridays. Recheck INR in 4 weeks. Coumadin Clinic [phone]

## 2021-03-29 ENCOUNTER — Encounter (HOSPITAL_COMMUNITY): Payer: Self-pay | Admitting: Licensed Clinical Social Worker

## 2021-03-29 ENCOUNTER — Ambulatory Visit (INDEPENDENT_AMBULATORY_CARE_PROVIDER_SITE_OTHER): Payer: Medicare Other | Admitting: Licensed Clinical Social Worker

## 2021-03-29 DIAGNOSIS — F411 Generalized anxiety disorder: Secondary | ICD-10-CM

## 2021-03-29 DIAGNOSIS — F320 Major depressive disorder, single episode, mild: Secondary | ICD-10-CM | POA: Diagnosis not present

## 2021-03-29 DIAGNOSIS — C61 Malignant neoplasm of prostate: Secondary | ICD-10-CM | POA: Diagnosis not present

## 2021-03-29 NOTE — Progress Notes (Signed)
   THERAPIST PROGRESS NOTE  Session Time: 9:00am-10:00am  Participation Level: Active  Behavioral Response: NeatAlertDysphoric  Type of Therapy: Individual Therapy  Treatment Goals addressed:  "to cope with stress and manage depression and anxiety". Malakai will report improved stress management and coping skills, resulting in mood stability and less worrying 4 out of 7 days.   Interventions: Motivational Interviewing   Summary: Hezikiah Retzloff is a 75 y.o. male who presents with Major Depressive Disorder, mild and Generalized Anxiety Disorder   Suicidal/Homicidal: Some thoughts without intent/plan   Therapist Response:   [name] met with clinician for individual therapy. Aurelio discussed his psychiatric symptoms and current life events. Yerick shared that he has been feeling low the past few weeks. While he shared that he has gratitude for his newly refurbished heart, he continues to feel concerned about his future care. Clinician utilized MI OARS to reflect and summarize thoughts and feelings about getting older, possibly needing advanced care, and worries about his children being able or willing to take care of him. Clinician reflected this fear, as he continues to support his son a great deal financially. Clinician discussed the importance of making his own arrangements, for the most part, as he does not trust his son to take care of him. Clinician also discussed the importance of being present in the moment and allowing himself to enjoy his health. Clinician reviewed coping skills and engaged in deep breathing.    Plan: Return again in 2 weeks.   Diagnosis: Axis I: Major depressive disorder, mild and Generalized Anxiety Disorder        [name], LCSW 03/29/2021

## 2021-04-05 DIAGNOSIS — M810 Age-related osteoporosis without current pathological fracture: Secondary | ICD-10-CM | POA: Diagnosis not present

## 2021-04-05 DIAGNOSIS — C775 Secondary and unspecified malignant neoplasm of intrapelvic lymph nodes: Secondary | ICD-10-CM | POA: Diagnosis not present

## 2021-04-05 DIAGNOSIS — C61 Malignant neoplasm of prostate: Secondary | ICD-10-CM | POA: Diagnosis not present

## 2021-04-12 ENCOUNTER — Encounter: Payer: Self-pay | Admitting: Cardiovascular Disease

## 2021-04-12 ENCOUNTER — Ambulatory Visit (HOSPITAL_BASED_OUTPATIENT_CLINIC_OR_DEPARTMENT_OTHER): Payer: Medicare Other | Admitting: Psychiatry

## 2021-04-12 ENCOUNTER — Ambulatory Visit (INDEPENDENT_AMBULATORY_CARE_PROVIDER_SITE_OTHER): Payer: Medicare Other | Admitting: Pulmonary Disease

## 2021-04-12 ENCOUNTER — Encounter: Payer: Self-pay | Admitting: Pulmonary Disease

## 2021-04-12 ENCOUNTER — Other Ambulatory Visit: Payer: Self-pay

## 2021-04-12 VITALS — BP 120/82 | HR 77 | Temp 96.9°F | Ht 68.0 in | Wt 207.0 lb

## 2021-04-12 DIAGNOSIS — I2510 Atherosclerotic heart disease of native coronary artery without angina pectoris: Secondary | ICD-10-CM

## 2021-04-12 DIAGNOSIS — G4733 Obstructive sleep apnea (adult) (pediatric): Secondary | ICD-10-CM | POA: Diagnosis not present

## 2021-04-12 DIAGNOSIS — R0602 Shortness of breath: Secondary | ICD-10-CM | POA: Diagnosis not present

## 2021-04-12 DIAGNOSIS — Z952 Presence of prosthetic heart valve: Secondary | ICD-10-CM

## 2021-04-12 DIAGNOSIS — F325 Major depressive disorder, single episode, in full remission: Secondary | ICD-10-CM | POA: Diagnosis not present

## 2021-04-12 DIAGNOSIS — I272 Pulmonary hypertension, unspecified: Secondary | ICD-10-CM

## 2021-04-12 DIAGNOSIS — R942 Abnormal results of pulmonary function studies: Secondary | ICD-10-CM

## 2021-04-12 LAB — PULMONARY FUNCTION TEST
DL/VA % pred: 70 %
DL/VA: 2.83 ml/min/mmHg/L
DLCO cor % pred: 62 %
DLCO cor: 14.91 ml/min/mmHg
DLCO unc % pred: 62 %
DLCO unc: 14.91 ml/min/mmHg
FEF 25-75 Post: 3.15 L/sec
FEF 25-75 Pre: 2.44 L/sec
FEF2575-%Change-Post: 29 %
FEF2575-%Pred-Post: 147 %
FEF2575-%Pred-Pre: 113 %
FEV1-%Change-Post: 5 %
FEV1-%Pred-Post: 98 %
FEV1-%Pred-Pre: 93 %
FEV1-Post: 2.85 L
FEV1-Pre: 2.71 L
FEV1FVC-%Change-Post: 2 %
FEV1FVC-%Pred-Pre: 107 %
FEV6-%Change-Post: 2 %
FEV6-%Pred-Post: 95 %
FEV6-%Pred-Pre: 92 %
FEV6-Post: 3.55 L
FEV6-Pre: 3.46 L
FEV6FVC-%Pred-Post: 106 %
FEV6FVC-%Pred-Pre: 106 %
FVC-%Change-Post: 2 %
FVC-%Pred-Post: 89 %
FVC-%Pred-Pre: 87 %
FVC-Post: 3.56 L
FVC-Pre: 3.47 L
Post FEV1/FVC ratio: 80 %
Post FEV6/FVC ratio: 100 %
Pre FEV1/FVC ratio: 78 %
Pre FEV6/FVC Ratio: 100 %
RV % pred: 97 %
RV: 2.34 L
TLC % pred: 87 %
TLC: 5.8 L

## 2021-04-12 MED ORDER — SPIRIVA RESPIMAT 2.5 MCG/ACT IN AERS: 2.0000 | INHALATION_SPRAY | Freq: Every day | RESPIRATORY_TRACT | 0 refills | Status: DC

## 2021-04-12 MED ORDER — GABAPENTIN 100 MG PO CAPS: ORAL_CAPSULE | ORAL | 1 refills | Status: DC

## 2021-04-12 MED ORDER — BUPROPION HCL ER (SR) 100 MG PO TB12: ORAL_TABLET | ORAL | 1 refills | Status: DC

## 2021-04-12 NOTE — Patient Instructions (Signed)
Full PFT performed today. °

## 2021-04-12 NOTE — Progress Notes (Signed)
Synopsis: Referred in August 2022 for chest tightness  Subjective:   PATIENT ID: Rodney Friedman GENDER: male DOB: [birth year], MRN: [id number]  HPI  Chief Complaint  Patient presents with   Follow-up    Follow up on PFT results.   Rodney Friedman is a 75 year old male, former smoker with CLL, atrial fibrillation, diabetes mellitus type II, hypertension, coronary artery disease, aortic stenosis and sleep apnea who returns to pulmonary clinic for shortness of breath.   He had TAVR procedure on 02/08/2021 and has been feeling significantly better since this operation.  His dyspnea has improved significantly and he reports he is able to do 25 minutes of continuous activity before he needs to take a break.Marland Kitchen  He was provided with Spiriva inhaler at last visit which he has been using intermittently for dyspnea and does find relief from this inhaler.  He is using it intermittently due to the cost of the inhaler.  He remains off cigarettes and reports he has quit 419 days now.  He continues to have frequent cravings.  He is not using any smoking cessation aids at this time.  Pulmonary function test today show an isolated moderate diffusion defect.  Review of his right heart cath on 01/20/2021 prior to the TAVR showed a mean pulmonary wedge pressure of 23 mmHg, mean PA pressure 34 mmHg.  OV 01/14/21 He was recently admitted to [facility] 7/29 to 8/1 for shortness of breath requiring bipap therapy. Initial concern was for pneumonia but antibiotics were discontinued and he was treated for heart failure exacerbation with improvement of his respiratory status.   Cardiology visit 8/22 with Dr. Dani Gobble Croitoru reviewed, he is being followed for aortic stenosis, atrial fibrillation, hypertension and coronary artery disease.  Given the progression of his aortic stenosis which is now deemed severe he is undergoing workup for TAVR.  He has history of CLL and is followed by Dr. Alvy Bimler  He quit smoking 30 days ago  and has a 25 pack year smoking history. He has quit on his own and has been doing ok with mainly having cravings in the morning. He mainly has exertional dyspnea which can lead to chest tightness. He has infrequent cough and wheezing. He denies sputum production. He has history of sleep apnea but reports no nighttime awakenings of dyspnea, cough or wheezing. He denies daytime sleepiness.   Family History  Problem Relation Age of Onset   Colon cancer Mother    Ovarian cancer Mother    Uterine cancer Mother    Hypertension Father    Prostate cancer Father    Colon polyps Neg Hx      Social History   Socioeconomic History   Marital status: Divorced    Spouse name: Not on file   Number of children: 2   Years of education: 42   Highest education level: Not on file  Occupational History   Occupation: Retired Pharmacist, hospital  Tobacco Use   Smoking status: Former    Packs/day: 0.50    Years: 50.00    Pack years: 25.00    Types: Cigarettes    Quit date: 12/16/2020    Years since quitting: 0.3   Smokeless tobacco: Never  Vaping Use   Vaping Use: Never used  Substance and Sexual Activity   Alcohol use: No    Alcohol/week: 0.0 standard drinks   Drug use: No   Sexual activity: Not Currently  Other Topics Concern   Not on file  Social History Narrative  Lives alone   Divorced   Right-handed   Education: College   No caffeine   Social Determinants of Radio broadcast assistant Strain: Not on file  Food Insecurity: Not on file  Transportation Needs: Not on file  Physical Activity: Not on file  Stress: Not on file  Social Connections: Not on file  Intimate Partner Violence: Not on file     Allergies  Allergen Reactions   Other Rash    Allergen: "Plants and bushes while doing yard work"     Outpatient Medications Prior to Visit  Medication Sig Dispense Refill   amoxicillin (AMOXIL) 500 MG tablet Take 4 tablets (2000 mg) by mouth ONE HOUR before any dental procedures. 12  tablet 4   B Complex Vitamins (VITAMIN-B COMPLEX PO) Take 1 tablet by mouth daily with supper.     BIOTIN PO Take 1 tablet by mouth every evening.     Calcium Carbonate-Vit D-Min (CALCIUM 1200 PO) Take 1,200 mg by mouth every evening.     calcium elemental as carbonate (TUMS ULTRA 1000) 400 MG chewable tablet Chew 1,000 mg by mouth daily as needed for heartburn.     docusate sodium (COLACE) 100 MG capsule Take 100 mg by mouth every evening.     ezetimibe (ZETIA) 10 MG tablet Take 1 tablet (10 mg total) by mouth daily. 90 tablet 3   furosemide (LASIX) 20 MG tablet Take 1 tablet (20 mg total) by mouth daily as needed (weight gain of 3 pounds or more overnight or 5 pounds in a week). 90 tablet 3   ketoconazole (NIZORAL) 2 % shampoo Apply 1 application topically 2 (two) times a week. 120 mL 3   latanoprost (XALATAN) 0.005 % ophthalmic solution Place 1 drop into the left eye every other day.     LYCOPENE PO Take 10 mg by mouth every other day.     Magnesium 250 MG TABS Take 250 mg by mouth every 3 (three) days.     metFORMIN (GLUCOPHAGE) 500 MG tablet Take 1 tablet (500 mg total) by mouth 2 (two) times daily with a meal. 180 tablet 2   metoprolol (TOPROL-XL) 50 MG 24 hr tablet Take 1 tablet (50 mg total) by mouth 2 (two) times daily. Take with or immediately following a meal. 30 tablet 0   Omega-3 Fatty Acids (FISH OIL) 1000 MG CAPS Take 1,000 mg by mouth daily with supper.     rosuvastatin (CRESTOR) 20 MG tablet Take 1 tablet (20 mg total) by mouth daily. 90 tablet 3   Sodium Fluoride (PREVIDENT 5000 DRY MOUTH DT) Place 1 application onto teeth See admin instructions. Apply to gums every night for dry mouth     umeclidinium bromide (INCRUSE ELLIPTA) 62.5 MCG/INH AEPB Inhale 1 puff into the lungs daily. 30 each 6   vitamin B-12 (CYANOCOBALAMIN) 500 MCG tablet Take 500 mcg by mouth every other day.     vitamin C (ASCORBIC ACID) 500 MG tablet Take 500-1,000 mg by mouth See admin instructions. Alternate  taking 500 mg one day and 1000 mg the next     Vitamin D, Ergocalciferol, (DRISDOL) 1.25 MG (50000 UNIT) CAPS capsule Take 50,000 Units by mouth every Saturday.     warfarin (COUMADIN) 4 MG tablet TAKE 1/2 TO 1 TABLET DAILY AS DIRECTED BY COUMADIN CLINIC 45 tablet 0   buPROPion ER (WELLBUTRIN SR) 100 MG 12 hr tablet 2  qam   1  q diner (Patient taking differently: 2  qam  1  q diner) 270 tablet 1   gabapentin (NEURONTIN) 100 MG capsule 3  qhs (Patient taking differently: 3  qhs) 270 capsule 1   No facility-administered medications prior to visit.    Review of Systems  Constitutional:  Negative for chills, fever, malaise/fatigue and weight loss.  HENT:  Negative for congestion, sinus pain and sore throat.   Eyes: Negative.   Respiratory:  Positive for shortness of breath. Negative for cough, hemoptysis, sputum production and wheezing.   Cardiovascular:  Negative for chest pain, palpitations, orthopnea, claudication and leg swelling.  Gastrointestinal:  Negative for abdominal pain, heartburn, nausea and vomiting.  Genitourinary: Negative.   Musculoskeletal:  Negative for joint pain and myalgias.  Skin:  Negative for rash.  Neurological:  Negative for weakness.  Endo/Heme/Allergies: Negative.   Psychiatric/Behavioral: Negative.     Objective:   Vitals:   04/12/21 0954  BP: 120/82  Pulse: 77  Temp: (!) 96.9 F (36.1 C)  TempSrc: Oral  SpO2: 99%  Weight: 207 lb (93.9 kg)  Height: 5\' 8"  (1.727 m)     Physical Exam Constitutional:      General: He is not in acute distress.    Appearance: He is obese.  HENT:     Head: Normocephalic and atraumatic.  Eyes:     Conjunctiva/sclera: Conjunctivae normal.  Cardiovascular:     Rate and Rhythm: Normal rate. Rhythm irregular.     Pulses: Normal pulses.     Heart sounds: No murmur heard. Pulmonary:     Effort: Pulmonary effort is normal.     Breath sounds: No wheezing, rhonchi or rales.  Abdominal:     General: Bowel sounds are  normal.     Palpations: Abdomen is soft.  Musculoskeletal:     Right lower leg: No edema.     Left lower leg: No edema.  Skin:    General: Skin is warm and dry.  Neurological:     General: No focal deficit present.     Mental Status: He is alert.    CBC    Component Value Date/Time   WBC 14.8 (H) 02/09/2021 0116   RBC 3.86 (L) 02/09/2021 0116   HGB 11.9 (L) 02/09/2021 0116   HGB 12.9 (L) 01/14/2021 0856   HGB 15.8 01/01/2017 0752   HCT 34.6 (L) 02/09/2021 0116   HCT 39.0 01/14/2021 0856   HCT 46.4 01/01/2017 0752   PLT 151 02/09/2021 0116   PLT 208 01/14/2021 0856   MCV 89.6 02/09/2021 0116   MCV 91 01/14/2021 0856   MCV 91.8 01/01/2017 0752   MCH 30.8 02/09/2021 0116   MCHC 34.4 02/09/2021 0116   RDW 14.6 02/09/2021 0116   RDW 14.6 01/14/2021 0856   RDW 13.5 01/01/2017 0752   LYMPHSABS 3.9 01/24/2021 0408   LYMPHSABS 9.5 (H) 04/30/2019 1103   LYMPHSABS 9.7 (H) 01/01/2017 0752   MONOABS 0.8 01/24/2021 0408   MONOABS 1.5 (H) 01/01/2017 0752   EOSABS 0.0 01/24/2021 0408   EOSABS 0.2 04/30/2019 1103   BASOSABS 0.4 (H) 01/24/2021 0408   BASOSABS 0.1 04/30/2019 1103   BASOSABS 0.1 01/01/2017 0752   BMP Latest Ref Rng & Units 02/09/2021 02/08/2021 02/08/2021  Glucose 70 - 99 mg/dL 102(H) 154(H) 100(H)  BUN 8 - 23 mg/dL 21 20 19   Creatinine 0.61 - 1.24 mg/dL 1.34(H) 1.20 1.20  BUN/Creat Ratio 10 - 24 - - -  Sodium 135 - 145 mmol/L 140 142 143  Potassium 3.5 - 5.1 mmol/L 3.9 4.2  3.8  Chloride 98 - 111 mmol/L 109 108 109  CO2 22 - 32 mmol/L 21(L) - -  Calcium 8.9 - 10.3 mg/dL 8.8(L) - -   Chest imaging: CTA Chest Aorta 12/30/20 No mediastinal or hilar adenopathy.  Esophagus is unremarkable.  Small bilateral pleural effusions lying dependently.  No pulmonary nodules or masses.  Saber-sheath trachea noted.  PFT: PFT Results Latest Ref Rng & Units 04/12/2021  FVC-Pre L 3.47  FVC-Predicted Pre % 87  FVC-Post L 3.56  FVC-Predicted Post % 89  Pre FEV1/FVC % % 78  Post  FEV1/FCV % % 80  FEV1-Pre L 2.71  FEV1-Predicted Pre % 93  FEV1-Post L 2.85  DLCO uncorrected ml/min/mmHg 14.91  DLCO UNC% % 62  DLCO corrected ml/min/mmHg 14.91  DLCO COR %Predicted % 62  DLVA Predicted % 70  TLC L 5.80  TLC % Predicted % 87  RV % Predicted % 97  Moderate diffusion defect.  Echo 12/17/2020: LVEF 60 to 65%.  LV has normal function.  LV diastolic parameters are indeterminate.  RV systolic function is normal.  RV size is normal.  Normal pulmonary artery pressure.  Left atrium is severely dilated.  Mean aortic gradient has increased from 25 mmHg to 33 mmHg.  Aortic valve is normal in structure.  There is moderate calcification of the aortic valve.  Moderate thickening of the aortic valve.  Moderate aortic valve stenosis.  Heart Cath 08/27/07 RV systolic [id number] RV diastolic 3 mmHg  PA systolic pressure 52 mmHg PA diastolic pressure 20 mmHg PA mean 34 mmHg PW mean 97mmHg  Assessment & Plan:   Pulmonary hypertension (HCC)  S/P TAVR (transcatheter aortic valve replacement)  Decreased diffusion capacity of lung  OSA (obstructive sleep apnea)  Discussion: Rodney Friedman is a 75 year old male, former smoker with CLL, atrial fibrillation, diabetes mellitus type II, hypertension, coronary artery disease, aortic stenosis and sleep apnea who returns to pulmonary clinic for shortness of breath.   His shortness of breath was mostly related to his severe aortic stenosis and findings of pulmonary hypertension on RHC from 01/20/21. The pulmonary hypertension is likely secondary to Group 2 valvular disease and possible component of Group 3 disease with his history of sleep apnea. His CT chest scans aren't concerning for significant emphysematous changes that would account for his moderate diffusion defect on PFTs today.   His breathing is significantly better after the TAVR and his physical activity is improving.  He did not tolerate CPAP therapy in the past and he does not want to  check overnight oximitry testing at this time as he reports he did not have issues while being monitored at the hospital.   He can continue spiriva inhaler daily or as needed.   He does qualify for lung cancer screening which we will refer him in the future as he recently had CT chest imaging without concerning nodules/masses.  Follow up in 6 months.  Freda Jackson, MD [facility] Office: [phone]   Current Outpatient Medications:    amoxicillin (AMOXIL) 500 MG tablet, Take 4 tablets (2000 mg) by mouth ONE HOUR before any dental procedures., Disp: 12 tablet, Rfl: 4   B Complex Vitamins (VITAMIN-B COMPLEX PO), Take 1 tablet by mouth daily with supper., Disp: , Rfl:    BIOTIN PO, Take 1 tablet by mouth every evening., Disp: , Rfl:    Calcium Carbonate-Vit D-Min (CALCIUM 1200 PO), Take 1,200 mg by mouth every evening., Disp: , Rfl:  calcium elemental as carbonate (TUMS ULTRA 1000) 400 MG chewable tablet, Chew 1,000 mg by mouth daily as needed for heartburn., Disp: , Rfl:    docusate sodium (COLACE) 100 MG capsule, Take 100 mg by mouth every evening., Disp: , Rfl:    ezetimibe (ZETIA) 10 MG tablet, Take 1 tablet (10 mg total) by mouth daily., Disp: 90 tablet, Rfl: 3   furosemide (LASIX) 20 MG tablet, Take 1 tablet (20 mg total) by mouth daily as needed (weight gain of 3 pounds or more overnight or 5 pounds in a week)., Disp: 90 tablet, Rfl: 3   ketoconazole (NIZORAL) 2 % shampoo, Apply 1 application topically 2 (two) times a week., Disp: 120 mL, Rfl: 3   latanoprost (XALATAN) 0.005 % ophthalmic solution, Place 1 drop into the left eye every other day., Disp: , Rfl:    LYCOPENE PO, Take 10 mg by mouth every other day., Disp: , Rfl:    Magnesium 250 MG TABS, Take 250 mg by mouth every 3 (three) days., Disp: , Rfl:    metFORMIN (GLUCOPHAGE) 500 MG tablet, Take 1 tablet (500 mg total) by mouth 2 (two) times daily with a meal., Disp: 180 tablet, Rfl: 2   metoprolol  (TOPROL-XL) 50 MG 24 hr tablet, Take 1 tablet (50 mg total) by mouth 2 (two) times daily. Take with or immediately following a meal., Disp: 30 tablet, Rfl: 0   Omega-3 Fatty Acids (FISH OIL) 1000 MG CAPS, Take 1,000 mg by mouth daily with supper., Disp: , Rfl:    rosuvastatin (CRESTOR) 20 MG tablet, Take 1 tablet (20 mg total) by mouth daily., Disp: 90 tablet, Rfl: 3   Sodium Fluoride (PREVIDENT 5000 DRY MOUTH DT), Place 1 application onto teeth See admin instructions. Apply to gums every night for dry mouth, Disp: , Rfl:    Tiotropium Bromide Monohydrate (SPIRIVA RESPIMAT) 2.5 MCG/ACT AERS, Inhale 2 puffs into the lungs daily., Disp: 4 g, Rfl: 0   umeclidinium bromide (INCRUSE ELLIPTA) 62.5 MCG/INH AEPB, Inhale 1 puff into the lungs daily., Disp: 30 each, Rfl: 6   vitamin B-12 (CYANOCOBALAMIN) 500 MCG tablet, Take 500 mcg by mouth every other day., Disp: , Rfl:    vitamin C (ASCORBIC ACID) 500 MG tablet, Take 500-1,000 mg by mouth See admin instructions. Alternate taking 500 mg one day and 1000 mg the next, Disp: , Rfl:    Vitamin D, Ergocalciferol, (DRISDOL) 1.25 MG (50000 UNIT) CAPS capsule, Take 50,000 Units by mouth every Saturday., Disp: , Rfl:    warfarin (COUMADIN) 4 MG tablet, TAKE 1/2 TO 1 TABLET DAILY AS DIRECTED BY COUMADIN CLINIC, Disp: 45 tablet, Rfl: 0   buPROPion ER (WELLBUTRIN SR) 100 MG 12 hr tablet, 2  qam   1  q diner, Disp: 270 tablet, Rfl: 1   gabapentin (NEURONTIN) 100 MG capsule, 3  qhs, Disp: 270 capsule, Rfl: 1

## 2021-04-12 NOTE — Patient Instructions (Signed)
Continue to use spiriva as needed  You have mild diffusion defect on your breathing tests today, meaning oxygen is not traveling across the lung tissue and blood vessel walls as easily.   Continue to increase physical activity as tolerated. Think about getting a gym membership to use a treadmill or stationary bike. Look into a silver sneakers programs.

## 2021-04-12 NOTE — Progress Notes (Signed)
Full PFT performed today. °

## 2021-04-12 NOTE — Progress Notes (Signed)
Psychiatric Initial Adult Assessment   Patient Identification: Rodney Friedman MRN:  [id number] Date of Evaluation:  04/12/2021 Referral Source: Dr. Nyoka Cowden Chief Complaint: Need follow-up care   Visit Diagnosis: Bipolar disorder, substance use disorder Bipolar disorder  History of Present Illness:  Today the patient emotionally is doing well.  Physically he is very stable.  Since I have seen him he has had aortic valve replacement.  He got into congestive heart failure.  He apparently has some issues with his mitral valve as well and has some degree of lung disease.  He has been free of cigarettes for over 100 days now.  The patient's mood is good.  He denies daily depression.  He is sleeping and eating well.  He is got good energy.  He has no problems thinking and concentrating and enjoys doing crossword puzzles.  He continues coming here for therapy on a regular basis.  He did lose 5 more pounds.  He goes to NA and AA regularly.  They have been very supportive throughout his recovery and surgery.  The patient takes his Wellbutrin as prescribed.  He has no evidence of psychosis.  The patient's issues are related to his children.  He has a daughter Margreta Journey who is in her fourth year of sobriety who lives in [place].  She is doing well.  She even works for substance abuse rehab program.  Patient also has a son.Marland Kitchen  His son has a daughter who is 44 years old who is doing pretty well.  His son's wife is no longer married to him.  The patient's son does have a girlfriend and they seem to be doing pretty well.  The patient denies any significant neurological symptoms at this time.  He is functioning actually very well.  His mood is good.  Substance Abuse History in the last 12 months:  Yes.    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 Angelena Form & Dr. 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    Past Surgical History:  Procedure Laterality Date   Arm Surgery Right    from door accident with glass   CARDIAC CATHETERIZATION     CATARACT EXTRACTION W/ INTRAOCULAR LENS IMPLANT Bilateral    COLONOSCOPY     CORONARY ARTERY BYPASS GRAFT  08/04/2004   EYE SURGERY     POLYPECTOMY     RIGHT/LEFT HEART CATH AND CORONARY/GRAFT ANGIOGRAPHY N/A 01/20/2021   Procedure: RIGHT/LEFT HEART CATH AND CORONARY/GRAFT ANGIOGRAPHY;  Surgeon: Burnell Blanks, MD;  Location: [facility];  Service: Cardiovascular;  Laterality: N/A;   TEE WITHOUT CARDIOVERSION N/A 02/08/2021   Procedure: TRANSESOPHAGEAL ECHOCARDIOGRAM (TEE);  Surgeon: Burnell Blanks, MD;  Location: [place] [facility];  Service: Open Heart Surgery;  Laterality: N/A;   TRANSCATHETER AORTIC VALVE REPLACEMENT, CAROTID Left 02/08/2021   Procedure: TRANSCATHETER AORTIC VALVE REPLACEMENT, LEFT CAROTID;  Surgeon: Burnell Blanks, MD;  Location: [facility];  Service: Open Heart Surgery;  Laterality: Left;    Family  Psychiatric History:   Family History:  Family History  Problem Relation Age of Onset   Colon cancer Mother    Ovarian cancer Mother    Uterine cancer Mother    Hypertension Father    Prostate cancer Father    Colon polyps Neg Hx     Social History:   Social History   Socioeconomic History   Marital status: Divorced    Spouse name: Not on file   Number of children: 2   Years of education: 16   Highest education level: Not on file  Occupational History   Occupation: Retired Pharmacist, hospital  Tobacco Use   Smoking status: Former    Packs/day: 0.50    Years: 50.00    Pack years: 25.00    Types: Cigarettes    Quit date: 12/16/2020    Years since quitting: 0.3   Smokeless tobacco: Never  Vaping Use   Vaping Use: Never used  Substance and Sexual Activity   Alcohol use: No    Alcohol/week: 0.0 standard drinks   Drug use: No   Sexual activity: Not Currently  Other Topics Concern   Not on file  Social History Narrative   Lives alone   Divorced   Right-handed   Education: College   No caffeine   Social Determinants of Health   Financial Resource Strain: Not on file  Food Insecurity: Not on file  Transportation Needs: Not on file  Physical Activity: Not on file  Stress: Not on file  Social Connections: Not on file    Additional Social History:   Allergies:   Allergies  Allergen Reactions   Other Rash    Allergen: "Plants and bushes while doing yard work"    Metabolic Disorder Labs: Lab Results  Component Value Date   HGBA1C 6.1 (H) 12/17/2020   MPG 128 12/17/2020   MPG 117 02/11/2016   No results found for: PROLACTIN Lab Results  Component Value Date   CHOL 119 04/26/2020   TRIG 120 04/26/2020   HDL 37 (L) 04/26/2020   CHOLHDL 3.2 04/26/2020   VLDL 41 (H) 03/10/2016   LDLCALC 60 04/26/2020   LDLCALC 57 10/27/2019     Current Medications: Current Outpatient Medications  Medication Sig Dispense Refill   amoxicillin (AMOXIL) 500 MG tablet Take 4  tablets (2000 mg) by mouth ONE HOUR before any dental procedures. 12 tablet 4   B Complex Vitamins (VITAMIN-B COMPLEX PO) Take 1 tablet by mouth daily with supper.     BIOTIN PO Take 1 tablet by mouth every evening.     buPROPion ER (WELLBUTRIN SR) 100 MG 12 hr tablet 2  qam   1  q diner 270 tablet 1   Calcium Carbonate-Vit D-Min (CALCIUM 1200 PO) Take 1,200 mg by  mouth every evening.     calcium elemental as carbonate (TUMS ULTRA 1000) 400 MG chewable tablet Chew 1,000 mg by mouth daily as needed for heartburn.     docusate sodium (COLACE) 100 MG capsule Take 100 mg by mouth every evening.     ezetimibe (ZETIA) 10 MG tablet Take 1 tablet (10 mg total) by mouth daily. 90 tablet 3   furosemide (LASIX) 20 MG tablet Take 1 tablet (20 mg total) by mouth daily as needed (weight gain of 3 pounds or more overnight or 5 pounds in a week). 90 tablet 3   gabapentin (NEURONTIN) 100 MG capsule 3  qhs 270 capsule 1   ketoconazole (NIZORAL) 2 % shampoo Apply 1 application topically 2 (two) times a week. 120 mL 3   latanoprost (XALATAN) 0.005 % ophthalmic solution Place 1 drop into the left eye every other day.     LYCOPENE PO Take 10 mg by mouth every other day.     Magnesium 250 MG TABS Take 250 mg by mouth every 3 (three) days.     metFORMIN (GLUCOPHAGE) 500 MG tablet Take 1 tablet (500 mg total) by mouth 2 (two) times daily with a meal. 180 tablet 2   metoprolol (TOPROL-XL) 50 MG 24 hr tablet Take 1 tablet (50 mg total) by mouth 2 (two) times daily. Take with or immediately following a meal. 30 tablet 0   Omega-3 Fatty Acids (FISH OIL) 1000 MG CAPS Take 1,000 mg by mouth daily with supper.     rosuvastatin (CRESTOR) 20 MG tablet Take 1 tablet (20 mg total) by mouth daily. 90 tablet 3   Sodium Fluoride (PREVIDENT 5000 DRY MOUTH DT) Place 1 application onto teeth See admin instructions. Apply to gums every night for dry mouth     Tiotropium Bromide Monohydrate (SPIRIVA RESPIMAT) 2.5 MCG/ACT AERS Inhale 2 puffs  into the lungs daily. 4 g 0   umeclidinium bromide (INCRUSE ELLIPTA) 62.5 MCG/INH AEPB Inhale 1 puff into the lungs daily. 30 each 6   vitamin B-12 (CYANOCOBALAMIN) 500 MCG tablet Take 500 mcg by mouth every other day.     vitamin C (ASCORBIC ACID) 500 MG tablet Take 500-1,000 mg by mouth See admin instructions. Alternate taking 500 mg one day and 1000 mg the next     Vitamin D, Ergocalciferol, (DRISDOL) 1.25 MG (50000 UNIT) CAPS capsule Take 50,000 Units by mouth every Saturday.     warfarin (COUMADIN) 4 MG tablet TAKE 1/2 TO 1 TABLET DAILY AS DIRECTED BY COUMADIN CLINIC 45 tablet 0   No current facility-administered medications for this visit.    Neurologic: Headache: No Seizure: No Paresthesias:No  Musculoskeletal: Strength & Muscle Tone: within normal limits Gait & Station: normal Patient leans: Right  Psychiatric Specialty Exam: ROS  There were no vitals taken for this visit.There is no height or weight on file to calculate BMI.  General Appearance: Casual  Eye Contact:  Good  Speech:  Clear and Coherent  Volume:  Normal  Mood:  NA  Affect:  Appropriate  Thought Process:  Goal Directed  Orientation:  NA  Thought Content:  WDL  Suicidal Thoughts:  No  Homicidal Thoughts:  No  Memory:  Negative  Judgement:  Good  Insight:  Good  Psychomotor Activity:  Normal  Concentration:    Recall:  [place] of Knowledge:Good  Language: Good  Akathisia:  No  Handed:  Right  AIMS (if indicated):    Assets:  Desire for Improvement  ADL's:  Intact  Cognition: WNL  Sleep:      Treatment Plan Summary: 11/22/20222:05 PM    This patient's 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  He takes Wellbutrin slow release 1 in the morning and 2 at night.  He also continues in one-to-one therapy which is very beneficial for him.  His second problem is substance use disorder that of alcohol.  For that he takes Neurontin 100 mg 3 at night.  The patient is not suicidal.  He is  functioning extremely well.  This patient she will return to see me in 3 months.

## 2021-04-13 ENCOUNTER — Encounter: Payer: Self-pay | Admitting: Pulmonary Disease

## 2021-04-19 ENCOUNTER — Ambulatory Visit (INDEPENDENT_AMBULATORY_CARE_PROVIDER_SITE_OTHER): Payer: Medicare Other | Admitting: Licensed Clinical Social Worker

## 2021-04-19 ENCOUNTER — Other Ambulatory Visit: Payer: Self-pay

## 2021-04-19 DIAGNOSIS — F339 Major depressive disorder, recurrent, unspecified: Secondary | ICD-10-CM | POA: Diagnosis not present

## 2021-04-19 DIAGNOSIS — F411 Generalized anxiety disorder: Secondary | ICD-10-CM | POA: Diagnosis not present

## 2021-04-19 NOTE — Progress Notes (Signed)
   THERAPIST PROGRESS NOTE  Session Time: 8:00am-9:00am  Participation Level: Active  Behavioral Response: NeatAlertDepressed  Type of Therapy: Individual Therapy  Treatment Goals addressed:  "to cope with stress and manage depression and anxiety". [name] will report improved stress management and coping skills, resulting in mood stability and less worrying 4 out of 7 days.   Interventions: Motivational Interviewing   Summary: Rodney Friedman is a 75 y.o. male who presents with Major Depressive Disorder, mild and Generalized Anxiety Disorder   Suicidal/Homicidal: Some thoughts without intent/plan   Therapist Response:   [name] met with clinician for individual therapy. Rodney Friedman discussed his psychiatric symptoms and current life events. Rodney Friedman shared that he has been doing alright. He shared that today is his father's birthday and he would have been 4. Clinician explored options for making this a special day in memory of father. Clinician utilized MI OARS tor reflect and summarize thoughts and feelings about his stage of grief, noting that some days will be better than others. Clinician encouraged reclaiming special days for joy and gratitude, rather than stewing in depressed mood and thoughts. Clinician processed relationship with son. Clinician discussed the importance of [name] having a realistic view of his son as he is, not how [name] wished he would be. Clinician also identified different ways to engage in the community, so he will have more people in his life for support.    Plan: Return again in 2 weeks.   Diagnosis: Axis I: Major depressive disorder, mild and Generalized Anxiety Disorder    [name], LCSW 04/19/2021

## 2021-04-21 ENCOUNTER — Encounter (HOSPITAL_COMMUNITY): Payer: Self-pay | Admitting: Licensed Clinical Social Worker

## 2021-04-29 ENCOUNTER — Other Ambulatory Visit: Payer: Self-pay

## 2021-04-29 ENCOUNTER — Encounter: Payer: Self-pay | Admitting: Cardiovascular Disease

## 2021-04-29 ENCOUNTER — Ambulatory Visit (INDEPENDENT_AMBULATORY_CARE_PROVIDER_SITE_OTHER): Payer: Medicare Other | Admitting: Cardiovascular Disease

## 2021-04-29 ENCOUNTER — Ambulatory Visit (INDEPENDENT_AMBULATORY_CARE_PROVIDER_SITE_OTHER): Payer: Medicare Other

## 2021-04-29 VITALS — BP 118/66 | HR 74 | Ht 68.0 in | Wt 210.4 lb

## 2021-04-29 DIAGNOSIS — Z952 Presence of prosthetic heart valve: Secondary | ICD-10-CM | POA: Diagnosis not present

## 2021-04-29 DIAGNOSIS — I4819 Other persistent atrial fibrillation: Secondary | ICD-10-CM | POA: Diagnosis not present

## 2021-04-29 DIAGNOSIS — E782 Mixed hyperlipidemia: Secondary | ICD-10-CM

## 2021-04-29 DIAGNOSIS — Z7901 Long term (current) use of anticoagulants: Secondary | ICD-10-CM

## 2021-04-29 DIAGNOSIS — E118 Type 2 diabetes mellitus with unspecified complications: Secondary | ICD-10-CM | POA: Diagnosis not present

## 2021-04-29 DIAGNOSIS — I10 Essential (primary) hypertension: Secondary | ICD-10-CM

## 2021-04-29 DIAGNOSIS — Z5181 Encounter for therapeutic drug level monitoring: Secondary | ICD-10-CM

## 2021-04-29 DIAGNOSIS — I2510 Atherosclerotic heart disease of native coronary artery without angina pectoris: Secondary | ICD-10-CM | POA: Diagnosis not present

## 2021-04-29 DIAGNOSIS — I482 Chronic atrial fibrillation, unspecified: Secondary | ICD-10-CM | POA: Diagnosis not present

## 2021-04-29 DIAGNOSIS — I5032 Chronic diastolic (congestive) heart failure: Secondary | ICD-10-CM | POA: Diagnosis not present

## 2021-04-29 LAB — POCT INR: INR: 3.7 — AB (ref 2.0–3.0)

## 2021-04-29 MED ORDER — EZETIMIBE 10 MG PO TABS: 10.0000 mg | ORAL_TABLET | Freq: Every day | ORAL | 3 refills | Status: DC

## 2021-04-29 MED ORDER — ROSUVASTATIN CALCIUM 20 MG PO TABS: 20.0000 mg | ORAL_TABLET | Freq: Every day | ORAL | 3 refills | Status: DC

## 2021-04-29 NOTE — Patient Instructions (Signed)
HOLD Saturday and then continue to take warfarin 1 tablet daily except for 1/2 a tablet on Tuesdays and Fridays. Recheck INR in 4 weeks. [facility] [phone]

## 2021-04-29 NOTE — Progress Notes (Signed)
Cardiology Office Note    Date:  04/30/2021   ID:  Rodney Friedman, DOB 1946-02-07, MRN [id number]  PCP:  Kristie Cowman, MD  Cardiologist:   Sanda Klein, MD   chief complaint: CAD, AFib, AS, HTN, HLP   History of Present Illness:  Rodney Friedman is a 75 y.o. male with CAD and remote CABG (three-vessel, March 2006; LIMA to LAD , SVG to diagonal and SVG to distal OM, all patent by coronary angiography 01/20/2021), persistent atrial fibrillation, aortic stenosis s/p TAVR (26 mm Edwards SAPIEN 3 ultra THV), severe mitral annular calcification with mitral regurgitation (presumably ischemic mechanism), carotid artery stenosis, hypertension, hyperlipidemia and type 2 diabetes mellitus.  He had substantial functional improvement after undergoing TAVR.  He no longer takes furosemide on a regular basis and has not required any adjustment in diuretics since the TAVR.  He initially felt like he was "75 years old", but now he recognizes that not all of his symptoms have improved dramatically.  He still appears to have excellent functional status, NYHA class I.  His pre-TAVR echocardiogram also showed what appeared to be severe mitral regurgitation with an ischemic mechanism (restricted posterior leaflet motion) as well as severe MAC, not a good candidate for MitraClip.  His follow-up echocardiogram shows normal function of the TAVR (mean gradient 7 mmHg, no insufficiency) and also shows that the mitral regurgitation is less significant, only moderate, following correction of the aortic stenosis.  LVEF is 50-55%, with wall motion abnormalities in the inferior wall.  Settle into the pattern of persistent atrial fibrillation.  None of his EKGs have documented sinus rhythm since September 10, 2019.  Interestingly, for many years she took multiple medications for hypertension which we had to stop due to what appears to be progressive aortic stenosis.  However, after he underwent TAVR, his blood pressure has  remained normal and the only antihypertensive he currently takes his metoprolol succinate 50 mg twice daily, which also is his rate control medication for atrial fibrillation.  The patient specifically denies any chest pain at rest exertion, dyspnea at rest or with exertion, orthopnea, paroxysmal nocturnal dyspnea, syncope, palpitations, focal neurological deficits, intermittent claudication, lower extremity edema, unexplained weight gain, cough, hemoptysis or wheezing.  He proudly points out that he has stayed quit from smoking for over 130 days now, although he still has urges to light up a cigarette.  He has never had a stroke or embolic event.  He is compliant with warfarin anticoagulation and has not had any bleeding complications.  His INR was a little high today.  He denies falls.  Glycemic control has been good with a recent hemoglobin A1c of 6.1%.  His most recent LDL cholesterol was 60 on statin plus ezetimibe therapy.  His prostate cancer is currently considered to be in remission.  He has a follow-up visit with Dr. Alvy Bimler for his CLL in March.  His most recent WBC was 14.8K and his hemoglobin was 11.9, platelets 151K.   Rodney Friedman's father passed away in 2019/08/01 at age 30.  He was very dedicated to his father's care and misses him.  However, his level of stress has gone down tremendously. He  is well versed in the theoretical aspects of addiction since he has a Masters degree on the topic.  He has a history of coronary disease and underwent 3 vessel bypass surgery in [place] in 2004-07-31 ("heartburn" and left antecubital pain ).  All his bypass grafts were open by angiography in September 2022.  He  underwent TAVR for severe aortic stenosis shortly thereafter (Edwards S3 U 26 mm).  He has had paroxysmal atrial fibrillation with RVR off and on, generally asymptomatic.Marland Kitchen His nuclear stress test in June 2013 showed normal pattern of perfusion and an ejection fraction of 52%. He has bilateral carotid bruits  that are reportedly due to external carotid artery stenoses. His last carotid ultrasound was performed in September 2022 and showed no significant internal carotid stenosis.   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 He has treated prostate cancer and is followed by Dr. Alvy Bimler.  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  He finally successfully quit smoking permanently in 2022, around the time of his TAVR. Past Medical History:  Diagnosis Date   Adenomatous colon polyp    Alcoholism ([place])    Anxiety    Atrial fibrillation (HCC)    CAD (coronary artery disease)    Cataract    removed left eye    CHF (congestive heart failure) ([place]) 12/16/2020   CLL (chronic lymphocytic leukemia) ([place]) 07/08/2013   Depression    Diabetes mellitus without complication (HCC)    Diverticulosis    Dyspnea    Dysrhythmia    Eye abnormality    Macular scarring R eye   Glaucoma    Heart murmur 2002   "diagnosed about 20 years ago". Pt says it causes abnormal EKGs   HLD (hyperlipidemia)    Hypertension    Leukemia ([place])    CLL   Myocardial infarction [facility]) 2006   Prostate cancer ([place]) 07/08/2013   [facility]- getting Lupron shot every 6 months - traces in prostate and 2 lymphnodes left hip- non focused traces per pt    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   s/p TAVR with a 79m Edwards S3U via the TF approach by Dr. MAngelena Form& Dr. BCyndia Bent  Sleep apnea    Substance abuse ([place]    Tremor, essential 09/22/2015    Past Surgical History:  Procedure Laterality Date   Arm Surgery Right    from door accident with glass   CARDIAC CATHETERIZATION     CATARACT EXTRACTION W/ INTRAOCULAR LENS IMPLANT Bilateral    COLONOSCOPY     CORONARY ARTERY BYPASS GRAFT  08/04/2004   EYE SURGERY     POLYPECTOMY     RIGHT/LEFT HEART CATH AND  CORONARY/GRAFT ANGIOGRAPHY N/A 01/20/2021   Procedure: RIGHT/LEFT HEART CATH AND CORONARY/GRAFT ANGIOGRAPHY;  Surgeon: MBurnell Blanks MD;  [facility];  Service: Cardiovascular;  Laterality: N/A;   TEE WITHOUT CARDIOVERSION N/A 02/08/2021   Procedure: TRANSESOPHAGEAL ECHOCARDIOGRAM (TEE);  Surgeon: MBurnell Blanks MD;  [facility];  Service: Open Heart Surgery;  Laterality: N/A;   TRANSCATHETER AORTIC VALVE REPLACEMENT, CAROTID Left 02/08/2021   Procedure: TRANSCATHETER AORTIC VALVE REPLACEMENT, LEFT CAROTID;  Surgeon: MBurnell Blanks MD;  [facility];  Service: Open Heart Surgery;  Laterality: Left;    Current Medications: Outpatient Medications Prior to Visit  Medication Sig Dispense Refill   B Complex Vitamins (VITAMIN-B COMPLEX PO) Take 1 tablet by mouth daily with supper.     BIOTIN PO Take 1 tablet by mouth every evening.     buPROPion ER (WELLBUTRIN SR) 100 MG 12 hr tablet 2  qam   1  q  diner 270 tablet 1   Calcium Carbonate-Vit D-Min (CALCIUM 1200 PO) Take 1,200 mg by mouth every evening.     docusate sodium (COLACE) 100 MG capsule Take 100 mg by mouth every evening.     gabapentin (NEURONTIN) 100 MG capsule 3  qhs 270 capsule 1   ketoconazole (NIZORAL) 2 % shampoo Apply 1 application topically 2 (two) times a week. 120 mL 3   latanoprost (XALATAN) 0.005 % ophthalmic solution Place 1 drop into the left eye every other day.     LYCOPENE PO Take 10 mg by mouth every other day.     Magnesium 250 MG TABS Take 250 mg by mouth every 3 (three) days.     metFORMIN (GLUCOPHAGE) 500 MG tablet Take 1 tablet (500 mg total) by mouth 2 (two) times daily with a meal. 180 tablet 2   metoprolol (TOPROL-XL) 50 MG 24 hr tablet Take 1 tablet (50 mg total) by mouth 2 (two) times daily. Take with or immediately following a meal. 30 tablet 0   Omega-3 Fatty Acids (FISH OIL) 1000 MG CAPS Take 1,000 mg by mouth daily with supper.      Sodium Fluoride (PREVIDENT 5000 DRY MOUTH DT) Place 1 application onto teeth See admin instructions. Apply to gums every night for dry mouth     vitamin B-12 (CYANOCOBALAMIN) 500 MCG tablet Take 500 mcg by mouth every other day.     vitamin C (ASCORBIC ACID) 500 MG tablet Take 500-1,000 mg by mouth See admin instructions. Alternate taking 500 mg one day and 1000 mg the next     Vitamin D, Ergocalciferol, (DRISDOL) 1.25 MG (50000 UNIT) CAPS capsule Take 50,000 Units by mouth every Saturday.     warfarin (COUMADIN) 4 MG tablet TAKE 1/2 TO 1 TABLET DAILY AS DIRECTED BY COUMADIN CLINIC 45 tablet 0   ezetimibe (ZETIA) 10 MG tablet Take 1 tablet (10 mg total) by mouth daily. 90 tablet 3   rosuvastatin (CRESTOR) 20 MG tablet Take 1 tablet (20 mg total) by mouth daily. 90 tablet 3   amoxicillin (AMOXIL) 500 MG tablet Take 4 tablets (2000 mg) by mouth ONE HOUR before any dental procedures. (Patient not taking: Reported on 04/29/2021) 12 tablet 4   calcium elemental as carbonate (TUMS ULTRA 1000) 400 MG chewable tablet Chew 1,000 mg by mouth daily as needed for heartburn. (Patient not taking: Reported on 04/29/2021)     furosemide (LASIX) 20 MG tablet Take 1 tablet (20 mg total) by mouth daily as needed (weight gain of 3 pounds or more overnight or 5 pounds in a week). (Patient not taking: Reported on 04/29/2021) 90 tablet 3   Tiotropium Bromide Monohydrate (SPIRIVA RESPIMAT) 2.5 MCG/ACT AERS Inhale 2 puffs into the lungs daily. (Patient not taking: Reported on 04/29/2021) 4 g 0   umeclidinium bromide (INCRUSE ELLIPTA) 62.5 MCG/INH AEPB Inhale 1 puff into the lungs daily. (Patient not taking: Reported on 04/29/2021) 30 each 6   No facility-administered medications prior to visit.     Allergies:   Other   Social History   Socioeconomic History   Marital status: Divorced    Spouse name: Not on file   Number of children: 2   Years of education: 16   Highest education level: Not on file  Occupational History    Occupation: Retired Pharmacist, hospital  Tobacco Use   Smoking status: Former    Packs/day: 0.50    Years: 50.00    Pack years: 25.00    Types: Cigarettes  Quit date: 12/16/2020    Years since quitting: 0.3   Smokeless tobacco: Never  Vaping Use   Vaping Use: Never used  Substance and Sexual Activity   Alcohol use: No    Alcohol/week: 0.0 standard drinks   Drug use: No   Sexual activity: Not Currently  Other Topics Concern   Not on file  Social History Narrative   Lives alone   Divorced   Right-handed   Education: College   No caffeine   Social Determinants of Health   Financial Resource Strain: Not on file  Food Insecurity: Not on file  Transportation Needs: Not on file  Physical Activity: Not on file  Stress: Not on file  Social Connections: Not on file     Family History:  The patient's family history includes Colon cancer in his mother; Hypertension in his father; Ovarian cancer in his mother; Prostate cancer in his father; Uterine cancer in his mother.   ROS:   Please see the history of present illness.   All other systems are reviewed and are negative.   PHYSICAL EXAM:   VS:  BP 118/66 (BP Location: Left Arm, Patient Position: Sitting, Cuff Size: Large)   Pulse 74   Ht _0  (1.727 m)   Wt 210 lb 6.4 oz (95.4 kg)   SpO2 98%   BMI 31.99 kg/m      General: Alert, oriented x3, no distress, appears well Head: no evidence of trauma, PERRL, EOMI, no exophtalmos or lid lag, no myxedema, no xanthelasma; normal ears, nose and oropharynx Neck: normal jugular venous pulsations and no hepatojugular reflux; brisk carotid pulses without delay and no carotid bruits Chest: clear to auscultation, no signs of consolidation by percussion or palpation, normal fremitus, symmetrical and full respiratory excursions Cardiovascular: normal position and quality of the apical impulse, irregular rhythm, normal first and second heart sounds, 1-2/6 aortic ejection murmur early peaking, no  diastolic murmurs, rubs or gallops Abdomen: no tenderness or distention, no masses by palpation, no abnormal pulsatility or arterial bruits, normal bowel sounds, no hepatosplenomegaly Extremities: no clubbing, cyanosis or edema; 2+ radial, ulnar and brachial pulses bilaterally; 2+ right femoral, posterior tibial and dorsalis pedis pulses; 2+ left femoral, posterior tibial and dorsalis pedis pulses; no subclavian or femoral bruits Neurological: grossly nonfocal Psych: Normal mood and affect    Wt Readings from Last 3 Encounters:  04/29/21 210 lb 6.4 oz (95.4 kg)  04/12/21 207 lb (93.9 kg)  03/10/21 203 lb 12.8 oz (92.4 kg)      Studies/Labs Reviewed:   ECHO 10/09/2019:   1. Left ventricular ejection fraction, by estimation, is 60 to 65%. The  left ventricle has normal function. The left ventricle has no regional  wall motion abnormalities. There is moderate concentric left ventricular  hypertrophy. Left ventricular  diastolic parameters are consistent with Grade II diastolic dysfunction  (pseudonormalization). Elevated left ventricular end-diastolic pressure.   2. Right ventricular systolic function is normal. The right ventricular  size is normal.   3. Left atrial size was severely dilated.   4. The mitral valve is normal in structure. Trivial mitral valve  regurgitation. No evidence of mitral stenosis.   5. The aortic valve is normal in structure. Aortic valve regurgitation is  mild. Moderate aortic valve stenosis. Aortic regurgitation PHT measures  520 msec. Aortic valve area, by VTI measures 1.82 cm. Aortic valve mean  gradient measures 25.0 mmHg.  Aortic valve Vmax measures 3.29 m/s.   6. The inferior vena cava is  normal in size with greater than 50%  respiratory variability, suggesting right atrial pressure of 3 mmHg.   Echocardiogram 03/10/2021    1. Left ventricular ejection fraction, by estimation, is 50 to 55%. The  left ventricle has low normal function. The left  ventricle demonstrates  regional wall motion abnormalities (see scoring diagram/findings for  description). There is mild left  ventricular hypertrophy. Left ventricular diastolic function could not be  evaluated. There is incoordinate septal motion. There is severe akinesis  of the left ventricular, entire inferoseptal wall and septal wall.   2. Right ventricular systolic function is moderately reduced. The right  ventricular size is normal. There is normal pulmonary artery systolic  pressure.   3. Left atrial size was severely dilated.   4. Right atrial size was moderately dilated.   5. The mitral valve is abnormal. Moderate mitral valve regurgitation. No  evidence of mitral stenosis. Moderate to severe mitral annular  calcification.   6. The aortic valve has been repaired/replaced. Aortic valve  regurgitation is not visualized. There is a 23 mm Sapien prosthetic (TAVR)  valve present in the aortic position. Procedure Date: 02/08/2021. Echo  findings are consistent with normal structure  and function of the aortic valve prosthesis. Aortic valve area, by VTI  measures 1.81 cm. Aortic valve mean gradient measures 7.0 mmHg. Aortic  valve Vmax measures 1.82 m/s. Peak gradient 13 mmHg, DI 0.44.   7. Aortic dilatation noted. There is borderline dilatation of the aortic  root, measuring 38 mm.   8. The inferior vena cava is normal in size with greater than 50%  respiratory variability, suggesting right atrial pressure of 3 mmHg.   Comparison(s): 02/08/2021: LVEF 45-50%, TAVR valve with mean gradient 13   EKG:  EKG is ordered today and personally reviewed.  This shows atrial fibrillation with ventricular rate of 74 bpm, left ventricular hypertrophy with a broad QRS at 116 ms, similar to previous tracings, no ischemic repolarization abnormalities, QTC 490 ms (likewise unchanged).   Recent Labs: 01/24/2021: B Natriuretic Peptide 672.5 02/04/2021: ALT 49 02/09/2021: BUN 21; Creatinine, Ser 1.34;  Hemoglobin 11.9; Magnesium 1.8; Platelets 151; Potassium 3.9; Sodium 140   Lipid Panel    Component Value Date/Time   CHOL 119 04/26/2020 0941   TRIG 120 04/26/2020 0941   HDL 37 (L) 04/26/2020 0941   CHOLHDL 3.2 04/26/2020 0941   CHOLHDL 5.8 (H) 03/10/2016 0830   VLDL 41 (H) 03/10/2016 0830   LDLCALC 60 04/26/2020 0941    ASSESSMENT:    1. S/P TAVR (transcatheter aortic valve replacement)   2. Persistent atrial fibrillation ([place])   3. Long term (current) use of anticoagulants   4. Chronic diastolic CHF (congestive heart failure) ([place])   5. Coronary artery disease involving native coronary artery of native heart without angina pectoris   6. Mixed hyperlipidemia   7. Type 2 diabetes mellitus with complication, without long-term current use of insulin ([place])   8. Essential hypertension        PLAN:  In order of problems listed above:  AS s/p TAVR: Excellent improvement in functional status after TAVR.  Aware of the need for endocarditis prophylaxis with dental procedures. AFib: This appears to now be a persistent arrhythmia and we have not documented normal rhythm since April 2021.  Well rate controlled and appears to be asymptomatic.  He is on warfarin anticoagulation without bleeding complications.  He has a severely dilated left atrium which makes it less likely that we will be successful  long-term maintenance of normal rhythm even after cardioversion.  At this point there seems to be no compelling reason to proceed with cardioversion.  Fair ventricular rate control, although not perfect.  On metoprolol.  On warfarin anticoagulation. CHADSVasc 4 (age, HTN, CAD, DM).   Warfarin: Usually in therapeutic range, slightly out of range today.  No bleeding complications. CHF: Has not required diuretics since the TAVR procedure.  Borderline low LVEF. CAD s/p CABG: Asymptomatic.  All 3 bypass grafts remain patent.  On beta-blocker.  Not on aspirin due to full anticoagulation for atrial  fibrillation. HLP: On high-dose rosuvastatin plus ezetimibe.  LDL in acceptable range.  HDL usually mildly low, will improve if he loses weight probably. DM: Good glycemic control, most recent hemoglobin A1c 6.1% on metformin monotherapy. HTN: Had to stop vasodilators as his aortic stenosis has progressed.  Interestingly, his blood pressure remains very well controlled even after we corrected the aortic stenosis.  This may be due to the fact that he is paying more attention to sodium restriction.  Smoking:  He really should be congratulated.  He has been sober from alcohol for close to 20 years and now has also managed to quit smoking.  He had recent PFTs which actually showed very good parameters.  No clear evidence that he has a emphysema or other damage from the smoking. Carotid bruits: Carotid ultrasound in September 2021 shows only mild bilateral plaque in internal carotids.  He has persistent carotid bruits from external carotid disease. OSA: Denies symptoms of daytime hypersomnolence.  He could not tolerate CPAP. Obesity: After successful weight loss over the last year, he has gained some of the weight back.  It seems to be related not fluid.  Encouraged him to continue to eat a healthy diet avoiding not just salt but also sweets, sugary drinks, starches with high glycemic index and saturated fat. CLL: His WBC count has actually gone down slightly to 14,000 from 16,000. No anemia or thrombocytopenia.  Followed by Dr. Alvy Bimler next appointment in April. Prostate Ca: PSA undetectable on current medications. Required radiation therapy for pelvic lymphadenopathy, with normalization of findings on PET scan.    Medication Adjustments/Labs and Tests Ordered: Current medicines are reviewed at length with the patient today.  Concerns regarding medicines are outlined above.  Medication changes, Labs and Tests ordered today are listed in the Patient Instructions below. Patient Instructions  Medication  Instructions:  No changes *If you need a refill on your cardiac medications before your next appointment, please call your pharmacy*   Lab Work: None ordered If you have labs (blood work) drawn today and your tests are completely normal, you will receive your results only by: [place] (if you have MyChart) OR A paper copy in the mail If you have any lab test that is abnormal or we need to change your treatment, we will call you to review the results.   Testing/Procedures: None ordered   Follow-Up: At [facility], you and your health needs are our priority.  As part of our continuing mission to provide you with exceptional heart care, we have created designated Provider Care Teams.  These Care Teams include your primary Cardiologist (physician) and Advanced Practice Providers (APPs -  Physician Assistants and Nurse Practitioners) who all work together to provide you with the care you need, when you need it.  We recommend signing up for the patient portal called "MyChart".  Sign up information is provided on this After Visit Summary.  MyChart is used  to connect with patients for Virtual Visits (Telemedicine).  Patients are able to view lab/test results, encounter notes, upcoming appointments, etc.  Non-urgent messages can be sent to your provider as well.   To learn more about what you can do with MyChart, go to [email or web address].    Your next appointment:   12 month(s)  The format for your next appointment:   In Person  Provider:   Sanda Klein, MD        Signed, Sanda Klein, MD  04/30/2021 6:14 PM    [facility] [address], [place], [place]  [address] Phone: [phone]; Fax: [phone]

## 2021-04-29 NOTE — Patient Instructions (Signed)

## 2021-05-03 ENCOUNTER — Other Ambulatory Visit: Payer: Self-pay

## 2021-05-03 ENCOUNTER — Ambulatory Visit (INDEPENDENT_AMBULATORY_CARE_PROVIDER_SITE_OTHER): Payer: Medicare Other | Admitting: Licensed Clinical Social Worker

## 2021-05-03 DIAGNOSIS — F320 Major depressive disorder, single episode, mild: Secondary | ICD-10-CM | POA: Diagnosis not present

## 2021-05-05 ENCOUNTER — Encounter (HOSPITAL_COMMUNITY): Payer: Self-pay | Admitting: Licensed Clinical Social Worker

## 2021-05-05 NOTE — Progress Notes (Signed)
° °  THERAPIST PROGRESS NOTE  Session Time:  9:00am-9:55am  Participation Level: Active  Behavioral Response: NeatAlertDepressed and Irritable  Type of Therapy: Individual Therapy  Treatment Goals addressed:  "to cope with stress and manage depression and anxiety". Rodney Friedman will report improved stress management and coping skills, resulting in mood stability and less worrying 4 out of 7 days.   Interventions: Motivational Interviewing   Summary: Rodney Friedman is a 75 y.o. male who presents with Major Depressive Disorder, mild and Generalized Anxiety Disorder   Suicidal/Homicidal: Some thoughts without intent/plan   Therapist Response:   Rodney Friedman met with clinician for individual therapy. Rodney Friedman discussed his psychiatric symptoms and current life events. Rodney Friedman shared that he has been doing alright. He shared ongoing concern about his health and noted fear about long term care for himself. Clinician discussed ways to ensure that basic care and services are put in place. Clinician utilized MI OARS to reflect and summarize thoughts and feelings. Clinician processed current health status and identified ways to maintain focus on his own life and to do what he can control. Clinician processed current to-do list.  Clinician explored update on grief and provided support.    Plan: Return again in 2 weeks.   Diagnosis: Axis I: Major depressive disorder, mild and Generalized Anxiety Disorder   [name], LCSW 05/05/2021

## 2021-05-09 ENCOUNTER — Encounter: Payer: Self-pay | Admitting: Hematology and Oncology

## 2021-05-27 ENCOUNTER — Ambulatory Visit (INDEPENDENT_AMBULATORY_CARE_PROVIDER_SITE_OTHER): Payer: Medicare Other

## 2021-05-27 ENCOUNTER — Other Ambulatory Visit: Payer: Self-pay

## 2021-05-27 DIAGNOSIS — I482 Chronic atrial fibrillation, unspecified: Secondary | ICD-10-CM | POA: Diagnosis not present

## 2021-05-27 DIAGNOSIS — Z5181 Encounter for therapeutic drug level monitoring: Secondary | ICD-10-CM

## 2021-05-27 DIAGNOSIS — Z7901 Long term (current) use of anticoagulants: Secondary | ICD-10-CM | POA: Diagnosis not present

## 2021-05-27 LAB — POCT INR: INR: 3.2 — AB (ref 2.0–3.0)

## 2021-05-27 NOTE — Patient Instructions (Signed)
continue to take warfarin 1 tablet daily except for 1/2 a tablet on Tuesdays and Fridays. Recheck INR in 4 weeks. Coumadin Clinic [phone] Eat greens tonight

## 2021-05-31 ENCOUNTER — Ambulatory Visit (INDEPENDENT_AMBULATORY_CARE_PROVIDER_SITE_OTHER): Payer: Medicare Other | Admitting: Licensed Clinical Social Worker

## 2021-05-31 ENCOUNTER — Other Ambulatory Visit: Payer: Self-pay

## 2021-05-31 ENCOUNTER — Encounter (HOSPITAL_COMMUNITY): Payer: Self-pay | Admitting: Licensed Clinical Social Worker

## 2021-05-31 DIAGNOSIS — F320 Major depressive disorder, single episode, mild: Secondary | ICD-10-CM

## 2021-05-31 NOTE — Progress Notes (Signed)
THERAPIST PROGRESS NOTE ° °Session Time: 11:00am-12:00pm ° °Participation Level: Active ° °Behavioral Response: NeatAlertDepressed and Irritable ° °Type of Therapy: Individual Therapy ° °  °Treatment Goals addressed:  "to cope with stress and manage depression and anxiety". Ramari will report improved stress management and coping skills, resulting in mood stability and less worrying 4 out of 7 days. °  °Interventions: Motivational Interviewing °  °Summary: Colm Ode is a 75 y.o. male who presents with Major Depressive Disorder, mild and Generalized Anxiety Disorder °  °Suicidal/Homicidal: Some thoughts without intent/plan °  °Therapist Response: °  °Cam met with clinician for individual therapy. Jakorian discussed his psychiatric symptoms and current life events. Tuvia shared that he cries daily missing his father. He also reports that he also replays the events of his childhood and the pain, both physical and emotional, that he experienced with his mother. Clinician utilized MI OARS to process the reasons why he feels stuck with such resentment against his mother and the reasons he continues to review this. Clinician discussed other concerns Graysen has shared, including worries about his son's financial issues. Clinician provided feedback and identified the value of looking for positivity in life, rather than the negativity and bad experiences. Clinician offered a gratitude list as a possible coping skill: good relationships with his children and grandchildren, financial security, improved health, stable and safe housing, etc. Clinician encouraged Arik to review this daily in order to start focusing his mind away from the negativity.  ° ° °Plan: Return again in 2 weeks. °  °Diagnosis: Axis I: Major depressive disorder, mild and Generalized Anxiety Disorder  °  °Jessica R Schlosberg, LCSW °05/31/2021 ° °

## 2021-06-10 ENCOUNTER — Other Ambulatory Visit: Payer: Self-pay | Admitting: Cardiovascular Disease

## 2021-06-10 DIAGNOSIS — I25709 Atherosclerosis of coronary artery bypass graft(s), unspecified, with unspecified angina pectoris: Secondary | ICD-10-CM

## 2021-06-14 ENCOUNTER — Ambulatory Visit (INDEPENDENT_AMBULATORY_CARE_PROVIDER_SITE_OTHER): Payer: Medicare Other | Admitting: Licensed Clinical Social Worker

## 2021-06-14 ENCOUNTER — Other Ambulatory Visit: Payer: Self-pay

## 2021-06-14 DIAGNOSIS — F330 Major depressive disorder, recurrent, mild: Secondary | ICD-10-CM

## 2021-06-14 DIAGNOSIS — F411 Generalized anxiety disorder: Secondary | ICD-10-CM

## 2021-06-14 NOTE — Progress Notes (Signed)
° °  THERAPIST PROGRESS NOTE  Session Time: 8:00am-8:55am  Participation Level: Active  Behavioral Response: NeatAlertDepressed and Irritable  Type of Therapy: Individual Therapy  Treatment Goals addressed:  "to cope with stress and manage depression and anxiety". Darvell will report improved stress management and coping skills, resulting in mood stability and less worrying 4 out of 7 days.   Interventions: Motivational Interviewing   Summary: Temitayo Covalt is a 76 y.o. male who presents with Major Depressive Disorder, mild and Generalized Anxiety Disorder   Suicidal/Homicidal: Some thoughts without intent/plan   Therapist Response:   [name] met with clinician for individual therapy. Remmy discussed his psychiatric symptoms and current life events. Jeoffrey shared that he continues to feel depression and grief sxs daily. However, he also shared frustration and aggravation about his ongoing health problems and fear about what will happen to him when he is unable to care for himself. Clinician utilized MI OARS to reflect and summarize thoughts and feelings about the future. Clinician also noted that worrying daily about what will happen will likely make the future more daunting and will bring the negativity to the forefront of his mind. Clinician discussed options for thought stopping and distraction when ruminating thoughts are present.   Plan: Return again in 2 weeks.   Diagnosis: Axis I: Major depressive disorder, mild and Generalized Anxiety Disorder     [name], LCSW 06/14/2021

## 2021-06-21 ENCOUNTER — Encounter (HOSPITAL_COMMUNITY): Payer: Self-pay | Admitting: Licensed Clinical Social Worker

## 2021-06-24 ENCOUNTER — Other Ambulatory Visit: Payer: Self-pay

## 2021-06-24 ENCOUNTER — Ambulatory Visit (INDEPENDENT_AMBULATORY_CARE_PROVIDER_SITE_OTHER): Payer: Medicare Other

## 2021-06-24 DIAGNOSIS — Z5181 Encounter for therapeutic drug level monitoring: Secondary | ICD-10-CM | POA: Diagnosis not present

## 2021-06-24 DIAGNOSIS — Z7901 Long term (current) use of anticoagulants: Secondary | ICD-10-CM

## 2021-06-24 DIAGNOSIS — I482 Chronic atrial fibrillation, unspecified: Secondary | ICD-10-CM | POA: Diagnosis not present

## 2021-06-24 LAB — POCT INR: INR: 4 — AB (ref 2.0–3.0)

## 2021-06-24 NOTE — Patient Instructions (Signed)
HOLD TODAY AND TAKE 0.5 tablet Saturday and then continue to take warfarin 1 tablet daily except for 1/2 a tablet on Tuesdays and Fridays. Recheck INR in 4 weeks. [facility] [phone]

## 2021-06-28 ENCOUNTER — Ambulatory Visit (INDEPENDENT_AMBULATORY_CARE_PROVIDER_SITE_OTHER): Payer: Medicare Other | Admitting: Licensed Clinical Social Worker

## 2021-06-28 ENCOUNTER — Other Ambulatory Visit: Payer: Self-pay

## 2021-06-28 ENCOUNTER — Encounter (HOSPITAL_COMMUNITY): Payer: Self-pay | Admitting: Licensed Clinical Social Worker

## 2021-06-28 DIAGNOSIS — F411 Generalized anxiety disorder: Secondary | ICD-10-CM | POA: Diagnosis not present

## 2021-06-28 DIAGNOSIS — F330 Major depressive disorder, recurrent, mild: Secondary | ICD-10-CM | POA: Diagnosis not present

## 2021-06-28 NOTE — Progress Notes (Signed)
° °  THERAPIST PROGRESS NOTE  Session Time: 11:00am-11:55am  Participation Level: Active  Behavioral Response: Well GroomedAlertEuthymic  Type of Therapy: Individual Therapy  Treatment Goals addressed:  "to cope with stress and manage depression and anxiety". [name] will report improved stress management and coping skills, resulting in mood stability and less worrying 4 out of 7 days.   Interventions: Motivational Interviewing   Summary: Rodney Friedman is a 76 y.o. male who presents with Major Depressive Disorder, mild and Generalized Anxiety Disorder   Suicidal/Homicidal: Some thoughts without intent/plan   Therapist Response:   Rodney Friedman met with clinician for individual therapy. Rodney Friedman discussed his psychiatric symptoms and current life events. Rodney Friedman shared that he has been doing okay since last session. He continues to express depressed mood and worry about the future. Rodney Friedman shared updates on friends who have assorted illness and are dying. Clinician processed his experience watching his friends get older and coping with his own health issues. Clinician identified the importance of maintaining the communication with friends in order to provide and receive support. Clinician also discussed ways that AA and NA have been supportive, not only in his recovery, but in gaining comradery with other group members.     Plan: Return again in 2 weeks.   Diagnosis: Axis I: Major depressive disorder, mild and Generalized Anxiety Disorder     [name], LCSW 06/28/2021

## 2021-07-12 ENCOUNTER — Other Ambulatory Visit: Payer: Self-pay

## 2021-07-12 ENCOUNTER — Ambulatory Visit (HOSPITAL_BASED_OUTPATIENT_CLINIC_OR_DEPARTMENT_OTHER): Payer: Medicare Other | Admitting: Psychiatry

## 2021-07-12 DIAGNOSIS — I25709 Atherosclerosis of coronary artery bypass graft(s), unspecified, with unspecified angina pectoris: Secondary | ICD-10-CM

## 2021-07-12 DIAGNOSIS — F325 Major depressive disorder, single episode, in full remission: Secondary | ICD-10-CM | POA: Diagnosis not present

## 2021-07-12 MED ORDER — GABAPENTIN 100 MG PO CAPS: ORAL_CAPSULE | ORAL | 1 refills | Status: DC

## 2021-07-12 MED ORDER — BUPROPION HCL ER (SR) 100 MG PO TB12: ORAL_TABLET | ORAL | 1 refills | Status: DC

## 2021-07-12 NOTE — Progress Notes (Signed)
Psychiatric Initial Adult Assessment   Patient Identification: Rodney Friedman MRN:  [id number] Date of Evaluation:  07/12/2021 Referral Source: Dr. Nyoka Cowden Chief Complaint: Need follow-up care   Visit Diagnosis: Bipolar disorder, substance use disorder Bipolar disorder  History of Present Illness:  Today the patient is doing well.  His mood is good.  He is having his grandchildren up to visit in a few months.  They are coming from [place].  In all he has 4 grandchildren all of whom are doing well.  In a few months will be the anniversary of the death of his father.  He has been deceased for 2 years.  Overall the patient actually is doing well.  He has not smoked cigarettes in well over 100 days and has been sober for over decades.  He goes to [place] regularly.  He does a lot of yard work and keeps active.  He lives weights.  He is sleeping and eating reasonably well.  He is got good energy.  He likes where he lives.  Financially he is stable.  He is functioning very well.  He has never been psychotic.  He continues in one-to-one therapy on a regular basis.  Substance Abuse History in the last 12 months:  Yes.    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 Angelena Form & Dr. 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    Past Surgical History:  Procedure Laterality Date   Arm Surgery Right    from door accident with glass   CARDIAC CATHETERIZATION     CATARACT EXTRACTION W/ INTRAOCULAR LENS IMPLANT Bilateral    COLONOSCOPY     CORONARY ARTERY BYPASS GRAFT  08/04/2004   EYE SURGERY     POLYPECTOMY     RIGHT/LEFT HEART CATH AND CORONARY/GRAFT ANGIOGRAPHY N/A 01/20/2021   Procedure: RIGHT/LEFT HEART CATH AND CORONARY/GRAFT ANGIOGRAPHY;  Surgeon: Burnell Blanks, MD;  Location: [place] [facility];  Service: Cardiovascular;  Laterality: N/A;   TEE WITHOUT CARDIOVERSION N/A 02/08/2021   Procedure: TRANSESOPHAGEAL ECHOCARDIOGRAM (TEE);  Surgeon: Burnell Blanks, MD;  Location: [place] [facility];  Service: Open Heart Surgery;  Laterality: N/A;   TRANSCATHETER AORTIC VALVE REPLACEMENT, CAROTID Left 02/08/2021   Procedure: TRANSCATHETER AORTIC VALVE REPLACEMENT, LEFT CAROTID;  Surgeon: Angelena Form,  Annita Brod, MD;  Location: [place] [facility];  Service: Open Heart Surgery;  Laterality: Left;    Family Psychiatric History:   Family History:  Family History  Problem Relation Age of Onset   Colon cancer Mother    Ovarian cancer Mother    Uterine cancer Mother    Hypertension Father    Prostate cancer Father    Colon polyps Neg Hx     Social History:   Social History   Socioeconomic History   Marital status: Divorced    Spouse name: Not on file   Number of children: 2   Years of education: 16   Highest education level: Not on file  Occupational History   Occupation: Retired Pharmacist, hospital  Tobacco Use   Smoking status: Former    Packs/day: 0.50    Years: 50.00    Pack years:  25.00    Types: Cigarettes    Quit date: 12/16/2020    Years since quitting: 0.5   Smokeless tobacco: Never  Vaping Use   Vaping Use: Never used  Substance and Sexual Activity   Alcohol use: No    Alcohol/week: 0.0 standard drinks   Drug use: No   Sexual activity: Not Currently  Other Topics Concern   Not on file  Social History Narrative   Lives alone   Divorced   Right-handed   Education: College   No caffeine   Social Determinants of Health   Financial Resource Strain: Not on file  Food Insecurity: Not on file  Transportation Needs: Not on file  Physical Activity: Not on file  Stress: Not on file  Social Connections: Not on file    Additional Social History:   Allergies:   Allergies  Allergen Reactions   Other Rash    Allergen: "Plants and bushes while doing yard work"    Metabolic Disorder Labs: Lab Results  Component Value Date   HGBA1C 6.1 (H) 12/17/2020   MPG 128 12/17/2020   MPG 117 02/11/2016   No results found for: PROLACTIN Lab Results  Component Value Date   CHOL 119 04/26/2020   TRIG 120 04/26/2020   HDL 37 (L) 04/26/2020   CHOLHDL 3.2 04/26/2020   VLDL 41 (H) 03/10/2016   LDLCALC 60 04/26/2020   LDLCALC 57 10/27/2019     Current Medications: Current Outpatient Medications  Medication Sig Dispense Refill   amoxicillin (AMOXIL) 500 MG tablet Take 4 tablets (2000 mg) by mouth ONE HOUR before any dental procedures. (Patient not taking: Reported on 04/29/2021) 12 tablet 4   B Complex Vitamins (VITAMIN-B COMPLEX PO) Take 1 tablet by mouth daily with supper.     BIOTIN PO Take 1 tablet by mouth every evening.     buPROPion ER (WELLBUTRIN SR) 100 MG 12 hr tablet 2  qam   1  q diner 270 tablet 1   Calcium Carbonate-Vit D-Min (CALCIUM 1200 PO) Take 1,200 mg by mouth every evening.     calcium elemental as carbonate (TUMS ULTRA 1000) 400 MG chewable tablet Chew 1,000 mg by mouth daily as needed for heartburn. (Patient not taking: Reported on  04/29/2021)     docusate sodium (COLACE) 100 MG capsule Take 100 mg by mouth every evening.     ezetimibe (ZETIA) 10 MG tablet Take 1 tablet (10 mg total) by mouth daily. 90 tablet 3   furosemide (LASIX) 20 MG tablet Take 1 tablet (20 mg total) by mouth daily as needed (weight gain of 3 pounds or more  overnight or 5 pounds in a week). (Patient not taking: Reported on 04/29/2021) 90 tablet 3   gabapentin (NEURONTIN) 100 MG capsule 3  qhs 270 capsule 1   ketoconazole (NIZORAL) 2 % shampoo Apply 1 application topically 2 (two) times a week. 120 mL 3   latanoprost (XALATAN) 0.005 % ophthalmic solution Place 1 drop into the left eye every other day.     LYCOPENE PO Take 10 mg by mouth every other day.     Magnesium 250 MG TABS Take 250 mg by mouth every 3 (three) days.     metFORMIN (GLUCOPHAGE) 500 MG tablet Take 1 tablet (500 mg total) by mouth 2 (two) times daily with a meal. 180 tablet 2   metoprolol (TOPROL-XL) 50 MG 24 hr tablet Take 1 tablet (50 mg total) by mouth 2 (two) times daily. Take with or immediately following a meal. 30 tablet 0   Omega-3 Fatty Acids (FISH OIL) 1000 MG CAPS Take 1,000 mg by mouth daily with supper.     rosuvastatin (CRESTOR) 20 MG tablet Take 1 tablet (20 mg total) by mouth daily. 90 tablet 3   Sodium Fluoride (PREVIDENT 5000 DRY MOUTH DT) Place 1 application onto teeth See admin instructions. Apply to gums every night for dry mouth     Tiotropium Bromide Monohydrate (SPIRIVA RESPIMAT) 2.5 MCG/ACT AERS Inhale 2 puffs into the lungs daily. (Patient not taking: Reported on 04/29/2021) 4 g 0   umeclidinium bromide (INCRUSE ELLIPTA) 62.5 MCG/INH AEPB Inhale 1 puff into the lungs daily. (Patient not taking: Reported on 04/29/2021) 30 each 6   vitamin B-12 (CYANOCOBALAMIN) 500 MCG tablet Take 500 mcg by mouth every other day.     vitamin C (ASCORBIC ACID) 500 MG tablet Take 500-1,000 mg by mouth See admin instructions. Alternate taking 500 mg one day and 1000 mg the next      Vitamin D, Ergocalciferol, (DRISDOL) 1.25 MG (50000 UNIT) CAPS capsule Take 50,000 Units by mouth every Saturday.     warfarin (COUMADIN) 4 MG tablet TAKE 1/2 TO 1 TABLET DAILY AS DIRECTED BY COUMADIN CLINIC 90 tablet 1   No current facility-administered medications for this visit.    Neurologic: Headache: No Seizure: No Paresthesias:No  Musculoskeletal: Strength & Muscle Tone: within normal limits Gait & Station: normal Patient leans: Right  Psychiatric Specialty Exam: ROS  There were no vitals taken for this visit.There is no height or weight on file to calculate BMI.  General Appearance: Casual  Eye Contact:  Good  Speech:  Clear and Coherent  Volume:  Normal  Mood:  NA  Affect:  Appropriate  Thought Process:  Goal Directed  Orientation:  NA  Thought Content:  WDL  Suicidal Thoughts:  No  Homicidal Thoughts:  No  Memory:  Negative  Judgement:  Good  Insight:  Good  Psychomotor Activity:  Normal  Concentration:    Recall:  [place] of Knowledge:Good  Language: Good  Akathisia:  No  Handed:  Right  AIMS (if indicated):    Assets:  Desire for Improvement  ADL's:  Intact  Cognition: WNL  Sleep:      Treatment Plan Summary: 2/21/20232:08 PM    At this time the patient is doing well.  His 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  He will continue taking Wellbutrin 1 in the morning and 2 in the afternoon.  His second problem is alcohol dependency.  He will continue taking Neurontin 100 mg 3 at night.  The  patient will continue in one-to-one therapy.  He will continue going to AA on a regular basis.  This patient she will return to see me in person in 5 months.

## 2021-07-19 ENCOUNTER — Other Ambulatory Visit: Payer: Self-pay

## 2021-07-19 ENCOUNTER — Encounter (HOSPITAL_COMMUNITY): Payer: Self-pay | Admitting: Licensed Clinical Social Worker

## 2021-07-19 ENCOUNTER — Ambulatory Visit (INDEPENDENT_AMBULATORY_CARE_PROVIDER_SITE_OTHER): Payer: Medicare Other | Admitting: Licensed Clinical Social Worker

## 2021-07-19 DIAGNOSIS — F330 Major depressive disorder, recurrent, mild: Secondary | ICD-10-CM | POA: Diagnosis not present

## 2021-07-19 DIAGNOSIS — F411 Generalized anxiety disorder: Secondary | ICD-10-CM

## 2021-07-19 NOTE — Progress Notes (Signed)
° °  THERAPIST PROGRESS NOTE  Session Time: 8:00am-8:55am  Participation Level: Active  Behavioral Response: Well GroomedAlertHopeless and Irritable  Type of Therapy: Individual Therapy  Treatment Goals addressed: "to cope with stress and manage depression and anxiety". Kristoff will report improved stress management and coping skills, resulting in mood stability and less worrying 4 out of 7 days.  ProgressTowards Goals: Progressing  Interventions: CBT and Motivational Interviewing  Summary: Yancy Knoble is a 76 y.o. male who presents with MDD mild and GAD.    Suicidal/Homicidal: Nowithout intent/plan  Therapist Response: [name] engaged well in session with clinician. Clinician utilized CBT to process current concerns about his children and grandchildren. Clinician reflected the feelings of frustration, lack of control, sadness, and worry about his son's finances and his granddaughter's lack of motivation in school. Clinician utilized reality testing to assist Giavanni in recognition of his ability to change others. Clinician summarized the list of assistance Jontavius has provided for his family. Clinician also noted that he cannot change his son and he cannot make decisions for his grandchildren. Clinician discussed alternative options for being supportive and allowing these adults to manage their lives and finances in the way they see fit. Clinician reviewed coping skills and reminded Tayquan about the importance of positive self-talk.   Plan: Return again in 2 weeks.  Diagnosis: MDD (major depressive disorder), recurrent episode, mild (HCC)  GAD (generalized anxiety disorder)  Collaboration of Care: Other none required in this session  Patient/Guardian was advised Release of Information must be obtained prior to any record release in order to collaborate their care with an outside provider. Patient/Guardian was advised if they have not already done so to contact the registration department to sign  all necessary forms in order for [place] to release information regarding their care.   Consent: Patient/Guardian gives verbal consent for treatment and assignment of benefits for services provided during this visit. Patient/Guardian expressed understanding and agreed to proceed.   Jobe Marker [place], LCSW 07/19/2021

## 2021-07-22 ENCOUNTER — Ambulatory Visit (INDEPENDENT_AMBULATORY_CARE_PROVIDER_SITE_OTHER): Payer: Medicare Other

## 2021-07-22 ENCOUNTER — Other Ambulatory Visit: Payer: Self-pay

## 2021-07-22 DIAGNOSIS — Z7901 Long term (current) use of anticoagulants: Secondary | ICD-10-CM

## 2021-07-22 DIAGNOSIS — Z5181 Encounter for therapeutic drug level monitoring: Secondary | ICD-10-CM

## 2021-07-22 DIAGNOSIS — I482 Chronic atrial fibrillation, unspecified: Secondary | ICD-10-CM | POA: Diagnosis not present

## 2021-07-22 LAB — POCT INR: INR: 5.6 — AB (ref 2.0–3.0)

## 2021-07-22 NOTE — Patient Instructions (Signed)
HOLD TODAY, Saturday and Sunday and then decrease to 1 tablet daily except for 1/2 a tablet on Sunday, Tuesday and Friday. Recheck INR in 2 weeks. Coumadin Clinic [phone]  ?

## 2021-07-26 ENCOUNTER — Inpatient Hospital Stay: Payer: Medicare Other

## 2021-07-26 ENCOUNTER — Other Ambulatory Visit: Payer: Self-pay

## 2021-07-26 ENCOUNTER — Inpatient Hospital Stay: Payer: Medicare Other | Attending: Hematology and Oncology | Admitting: Hematology and Oncology

## 2021-07-26 DIAGNOSIS — Z923 Personal history of irradiation: Secondary | ICD-10-CM | POA: Diagnosis not present

## 2021-07-26 DIAGNOSIS — Z79899 Other long term (current) drug therapy: Secondary | ICD-10-CM | POA: Diagnosis not present

## 2021-07-26 DIAGNOSIS — C61 Malignant neoplasm of prostate: Secondary | ICD-10-CM | POA: Insufficient documentation

## 2021-07-26 DIAGNOSIS — Z87891 Personal history of nicotine dependence: Secondary | ICD-10-CM | POA: Insufficient documentation

## 2021-07-26 DIAGNOSIS — C9110 Chronic lymphocytic leukemia of B-cell type not having achieved remission: Secondary | ICD-10-CM | POA: Insufficient documentation

## 2021-07-26 LAB — CBC WITH DIFFERENTIAL/PLATELET
Abs Immature Granulocytes: 0.03 10*3/uL (ref 0.00–0.07)
Basophils Absolute: 0.1 10*3/uL (ref 0.0–0.1)
Basophils Relative: 1 %
Eosinophils Absolute: 0.2 10*3/uL (ref 0.0–0.5)
Eosinophils Relative: 1 %
HCT: 39.4 % (ref 39.0–52.0)
Hemoglobin: 13.2 g/dL (ref 13.0–17.0)
Immature Granulocytes: 0 %
Lymphocytes Relative: 48 %
Lymphs Abs: 6.3 10*3/uL — ABNORMAL HIGH (ref 0.7–4.0)
MCH: 29.9 pg (ref 26.0–34.0)
MCHC: 33.5 g/dL (ref 30.0–36.0)
MCV: 89.1 fL (ref 80.0–100.0)
Monocytes Absolute: 0.9 10*3/uL (ref 0.1–1.0)
Monocytes Relative: 7 %
Neutro Abs: 5.7 10*3/uL (ref 1.7–7.7)
Neutrophils Relative %: 43 %
Platelets: 204 10*3/uL (ref 150–400)
RBC: 4.42 MIL/uL (ref 4.22–5.81)
RDW: 13.9 % (ref 11.5–15.5)
WBC: 13.1 10*3/uL — ABNORMAL HIGH (ref 4.0–10.5)
nRBC: 0 % (ref 0.0–0.2)

## 2021-07-27 ENCOUNTER — Encounter: Payer: Self-pay | Admitting: Hematology and Oncology

## 2021-07-27 NOTE — Assessment & Plan Note (Signed)
He has no clinical signs of disease progression I will continue to see him once a year 

## 2021-07-27 NOTE — Progress Notes (Signed)
[place] OFFICE PROGRESS NOTE  Patient Care Team: Kristie Cowman, MD as PCP - General (Family Medicine) Croitoru, Dani Gobble, MD as PCP - Cardiology (Cardiology) Kathie Rhodes, MD (Inactive) as Consulting Physician (Urology) Croitoru, Dani Gobble, MD as Consulting Physician (Cardiology) Gaye Pollack, MD as Consulting Physician (Cardiothoracic Surgery)  ASSESSMENT & PLAN:  CLL (chronic lymphocytic leukemia) [facility]) He has no clinical signs of disease progression I will continue to see him once a year  Prostate cancer [facility]) The patient is being followed by urologist team for management of prostate cancer We discussed the risk and benefits of referral to see Dr. Alen Blew for future management of prostate cancer He will think about it  No orders of the defined types were placed in this encounter.   All questions were answered. The patient knows to call the clinic with any problems, questions or concerns. The total time spent in the appointment was 20 minutes encounter with patients including review of chart and various tests results, discussions about plan of care and coordination of care plan   Heath Lark, MD 07/27/2021 1:21 PM  INTERVAL HISTORY: Please see below for problem oriented charting. he returns for annual follow-up for CLL He also have history of prostate cancer status post radiation treatment He had recurrent admission to the hospital with congestive heart failure exacerbation but he is doing well now He has quit smoking for over 8 months Denies lymphadenopathy or recent infection  REVIEW OF SYSTEMS:   Constitutional: Denies fevers, chills or abnormal weight loss Eyes: Denies blurriness of vision Ears, nose, mouth, throat, and face: Denies mucositis or sore throat Respiratory: Denies cough, dyspnea or wheezes Cardiovascular: Denies palpitation, chest discomfort or lower extremity swelling Gastrointestinal:  Denies nausea, heartburn or change in bowel habits Skin:  Denies abnormal skin rashes Lymphatics: Denies new lymphadenopathy or easy bruising Neurological:Denies numbness, tingling or new weaknesses Behavioral/Psych: Mood is stable, no new changes  All other systems were reviewed with the patient and are negative.  I have reviewed the past medical history, past surgical history, social history and family history with the patient and they are unchanged from previous note.  ALLERGIES:  is allergic to other.  MEDICATIONS:  Current Outpatient Medications  Medication Sig Dispense Refill   amoxicillin (AMOXIL) 500 MG tablet Take 4 tablets (2000 mg) by mouth ONE HOUR before any dental procedures. (Patient not taking: Reported on 04/29/2021) 12 tablet 4   B Complex Vitamins (VITAMIN-B COMPLEX PO) Take 1 tablet by mouth daily with supper.     BIOTIN PO Take 1 tablet by mouth every evening.     buPROPion ER (WELLBUTRIN SR) 100 MG 12 hr tablet 2  qam   1  q diner 270 tablet 1   Calcium Carbonate-Vit D-Min (CALCIUM 1200 PO) Take 1,200 mg by mouth every evening.     calcium elemental as carbonate (TUMS ULTRA 1000) 400 MG chewable tablet Chew 1,000 mg by mouth daily as needed for heartburn. (Patient not taking: Reported on 04/29/2021)     docusate sodium (COLACE) 100 MG capsule Take 100 mg by mouth every evening.     ezetimibe (ZETIA) 10 MG tablet Take 1 tablet (10 mg total) by mouth daily. 90 tablet 3   furosemide (LASIX) 20 MG tablet Take 1 tablet (20 mg total) by mouth daily as needed (weight gain of 3 pounds or more overnight or 5 pounds in a week). (Patient not taking: Reported on 04/29/2021) 90 tablet 3   gabapentin (NEURONTIN) 100 MG capsule  3  qhs 270 capsule 1   ketoconazole (NIZORAL) 2 % shampoo Apply 1 application topically 2 (two) times a week. 120 mL 3   latanoprost (XALATAN) 0.005 % ophthalmic solution Place 1 drop into the left eye every other day.     LYCOPENE PO Take 10 mg by mouth every other day.     Magnesium 250 MG TABS Take 250 mg by mouth  every 3 (three) days.     metFORMIN (GLUCOPHAGE) 500 MG tablet Take 1 tablet (500 mg total) by mouth 2 (two) times daily with a meal. 180 tablet 2   metoprolol (TOPROL-XL) 50 MG 24 hr tablet Take 1 tablet (50 mg total) by mouth 2 (two) times daily. Take with or immediately following a meal. 30 tablet 0   Omega-3 Fatty Acids (FISH OIL) 1000 MG CAPS Take 1,000 mg by mouth daily with supper.     rosuvastatin (CRESTOR) 20 MG tablet Take 1 tablet (20 mg total) by mouth daily. 90 tablet 3   Sodium Fluoride (PREVIDENT 5000 DRY MOUTH DT) Place 1 application onto teeth See admin instructions. Apply to gums every night for dry mouth     Tiotropium Bromide Monohydrate (SPIRIVA RESPIMAT) 2.5 MCG/ACT AERS Inhale 2 puffs into the lungs daily. (Patient not taking: Reported on 04/29/2021) 4 g 0   umeclidinium bromide (INCRUSE ELLIPTA) 62.5 MCG/INH AEPB Inhale 1 puff into the lungs daily. (Patient not taking: Reported on 04/29/2021) 30 each 6   vitamin B-12 (CYANOCOBALAMIN) 500 MCG tablet Take 500 mcg by mouth every other day.     vitamin C (ASCORBIC ACID) 500 MG tablet Take 500-1,000 mg by mouth See admin instructions. Alternate taking 500 mg one day and 1000 mg the next     Vitamin D, Ergocalciferol, (DRISDOL) 1.25 MG (50000 UNIT) CAPS capsule Take 50,000 Units by mouth every Saturday.     warfarin (COUMADIN) 4 MG tablet TAKE 1/2 TO 1 TABLET DAILY AS DIRECTED BY COUMADIN CLINIC 90 tablet 1   No current facility-administered medications for this visit.    SUMMARY OF ONCOLOGIC HISTORY: Oncology History Overview Note  Prostate cancer   Primary site: Prostate (Left)   Staging method: AJCC 7th Edition   Clinical: Stage IIB (T1c, N0, M0) signed by Heath Lark, MD on 07/09/2013 10:04 AM   Summary: Stage IIB (T1c, N0, M0)  CLL stage I. FISH normal   CLL (chronic lymphocytic leukemia) ([place])  07/08/2013 Pathology Results   Peripheral Blood Flow Cytometry - MONOCLONAL B-CELL POPULATION IDENTIFIED. Diagnosis  Comment: The phenotypic features are consistent with chronic lymphocytic leukemia. Clinical correlation is recommended.    07/08/2013 Pathology Results   FISH panel is normal   07/20/2020 Cancer Staging   Staging form: Chronic Lymphocytic Leukemia / Small Lymphocytic Lymphoma, AJCC 8th Edition - Clinical stage from 07/20/2020: Modified Rai Stage 0 (Modified Rai risk: Low, Lymphocytosis: Present, Adenopathy: Absent, Organomegaly: Absent, Anemia: Absent, Thrombocytopenia: Absent) - Signed by Heath Lark, MD on 07/20/2020    Prostate cancer ([place])  07/12/2012 Tumor Marker   PSA was high at 9.6   09/30/2012 Procedure   He underwent prostate biopsy at confirm diagnosis of prostate cancer.   11/11/2012 - 01/09/2013 Radiation Therapy   The patient completed radiation therapy to his prostate using IM RT technique   06/22/2017 PET scan   PET scan showed left external LN involvement along with activity within prostate     PHYSICAL EXAMINATION: ECOG PERFORMANCE STATUS: 1 - Symptomatic but completely ambulatory  Vitals:   07/26/21  0836  BP: 136/79  Pulse: 64  Resp: 18  Temp: 97.9 F (36.6 C)  SpO2: 99%   Filed Weights   07/26/21 0836  Weight: 219 lb (99.3 kg)    GENERAL:alert, no distress and comfortable NEURO: alert & oriented x 3 with fluent speech, no focal motor/sensory deficits  LABORATORY DATA:  I have reviewed the data as listed    Component Value Date/Time   NA 140 02/09/2021 0116   NA 141 01/14/2021 0856   NA 141 01/04/2015 0808   K 3.9 02/09/2021 0116   K 4.3 01/04/2015 0808   CL 109 02/09/2021 0116   CO2 21 (L) 02/09/2021 0116   CO2 21 (L) 01/04/2015 0808   GLUCOSE 102 (H) 02/09/2021 0116   GLUCOSE 115 01/04/2015 0808   BUN 21 02/09/2021 0116   BUN 22 01/14/2021 0856   BUN 18.7 01/04/2015 0808   CREATININE 1.34 (H) 02/09/2021 0116   CREATININE 1.16 03/10/2016 0830   CREATININE 0.9 01/04/2015 0808   CALCIUM 8.8 (L) 02/09/2021 0116   CALCIUM 9.0 01/04/2015 0808    PROT 6.2 (L) 02/04/2021 1500   PROT 6.3 04/26/2020 0941   PROT 6.3 (L) 01/04/2015 0808   ALBUMIN 3.6 02/04/2021 1500   ALBUMIN 4.1 04/26/2020 0941   ALBUMIN 3.7 01/04/2015 0808   AST 46 (H) 02/04/2021 1500   AST 19 01/04/2015 0808   ALT 49 (H) 02/04/2021 1500   ALT 33 01/04/2015 0808   ALKPHOS 36 (L) 02/04/2021 1500   ALKPHOS 47 01/04/2015 0808   BILITOT 1.0 02/04/2021 1500   BILITOT 0.4 04/26/2020 0941   BILITOT 0.23 01/04/2015 0808   GFRNONAA 56 (L) 02/09/2021 0116   GFRNONAA 70 02/11/2016 1526   GFRAA 79 04/26/2020 0941   GFRAA 81 02/11/2016 1526    No results found for: SPEP, UPEP  Lab Results  Component Value Date   WBC 13.1 (H) 07/26/2021   NEUTROABS 5.7 07/26/2021   HGB 13.2 07/26/2021   HCT 39.4 07/26/2021   MCV 89.1 07/26/2021   PLT 204 07/26/2021      Chemistry      Component Value Date/Time   NA 140 02/09/2021 0116   NA 141 01/14/2021 0856   NA 141 01/04/2015 0808   K 3.9 02/09/2021 0116   K 4.3 01/04/2015 0808   CL 109 02/09/2021 0116   CO2 21 (L) 02/09/2021 0116   CO2 21 (L) 01/04/2015 0808   BUN 21 02/09/2021 0116   BUN 22 01/14/2021 0856   BUN 18.7 01/04/2015 0808   CREATININE 1.34 (H) 02/09/2021 0116   CREATININE 1.16 03/10/2016 0830   CREATININE 0.9 01/04/2015 0808      Component Value Date/Time   CALCIUM 8.8 (L) 02/09/2021 0116   CALCIUM 9.0 01/04/2015 0808   ALKPHOS 36 (L) 02/04/2021 1500   ALKPHOS 47 01/04/2015 0808   AST 46 (H) 02/04/2021 1500   AST 19 01/04/2015 0808   ALT 49 (H) 02/04/2021 1500   ALT 33 01/04/2015 0808   BILITOT 1.0 02/04/2021 1500   BILITOT 0.4 04/26/2020 0941   BILITOT 0.23 01/04/2015 2831

## 2021-07-27 NOTE — Assessment & Plan Note (Signed)
The patient is being followed by urologist team for management of prostate cancer ?We discussed the risk and benefits of referral to see Dr. Alen Blew for future management of prostate cancer ?He will think about it ?

## 2021-08-02 ENCOUNTER — Ambulatory Visit (INDEPENDENT_AMBULATORY_CARE_PROVIDER_SITE_OTHER): Payer: Medicare Other | Admitting: Licensed Clinical Social Worker

## 2021-08-02 ENCOUNTER — Other Ambulatory Visit: Payer: Self-pay

## 2021-08-02 ENCOUNTER — Encounter (HOSPITAL_COMMUNITY): Payer: Self-pay | Admitting: Licensed Clinical Social Worker

## 2021-08-02 DIAGNOSIS — F411 Generalized anxiety disorder: Secondary | ICD-10-CM

## 2021-08-02 DIAGNOSIS — F330 Major depressive disorder, recurrent, mild: Secondary | ICD-10-CM | POA: Diagnosis not present

## 2021-08-02 NOTE — Progress Notes (Signed)
? ?  THERAPIST PROGRESS NOTE ? ?Session Time: 8:00am-9:00am ? ?Participation Level: Active ? ?Behavioral Response: Well GroomedAlertDepressed and Irritable ? ?Type of Therapy: Individual Therapy ? ?Treatment Goals addressed: to cope with stress and manage depression and anxiety". [name] will report improved stress management and coping skills, resulting in mood stability and less worrying 4 out of 7 days. ? ?ProgressTowards Goals: Progressing ? ?Interventions: Motivational Interviewing ? ?Summary: Rodney Friedman is a 76 y.o. male who presents with MDD, mild, and GAD.  ? ?Suicidal/Homicidal: Nowithout intent/plan ? ?Therapist Response: [name] engaged well in session with clinician. Clinician utilized [place] OARS to reflect and summarize thoughts and feelings about current events in the country, as well as in our city. Clinician explored [name]'s interactions and concerns. [name] shared ongoing fears and worry about what will happen to him when he becomes too feeble or unable to manage his own health, finances, etc. Clinician explored the impact of worry on his inner peace and health. Clinician identified the value of being aware and on top of things without having to have intrusive worrying thoughts persistent. Clinician explored options for thought stopping and being able to settle into a routine.  ? ?Plan: Return again in 2 weeks. ? ?Diagnosis: MDD (major depressive disorder), recurrent episode, mild ([place]) ? ?GAD (generalized anxiety disorder) ? ?Collaboration of Care: Other none required in this session.  ? ?Patient/Guardian was advised Release of Information must be obtained prior to any record release in order to collaborate their care with an outside provider. Patient/Guardian was advised if they have not already done so to contact the registration department to sign all necessary forms in order for [place] to release information regarding their care.  ? ?Consent: Patient/Guardian gives verbal consent for treatment and  assignment of benefits for services provided during this visit. Patient/Guardian expressed understanding and agreed to proceed.  ? ?[name], LCSW ?08/02/2021 ? ?

## 2021-08-05 ENCOUNTER — Ambulatory Visit (INDEPENDENT_AMBULATORY_CARE_PROVIDER_SITE_OTHER): Payer: Medicare Other

## 2021-08-05 ENCOUNTER — Other Ambulatory Visit: Payer: Self-pay

## 2021-08-05 DIAGNOSIS — Z5181 Encounter for therapeutic drug level monitoring: Secondary | ICD-10-CM

## 2021-08-05 DIAGNOSIS — I482 Chronic atrial fibrillation, unspecified: Secondary | ICD-10-CM | POA: Diagnosis not present

## 2021-08-05 DIAGNOSIS — Z7901 Long term (current) use of anticoagulants: Secondary | ICD-10-CM

## 2021-08-05 LAB — POCT INR: INR: 2.8 (ref 2.0–3.0)

## 2021-08-05 NOTE — Patient Instructions (Signed)
Continue 1 tablet daily except for 1/2 a tablet on Sunday, Tuesday and Friday. Recheck INR in 6 weeks. Coumadin Clinic [phone]  ?

## 2021-08-10 ENCOUNTER — Other Ambulatory Visit: Payer: Self-pay | Admitting: Family Medicine

## 2021-08-10 DIAGNOSIS — Z122 Encounter for screening for malignant neoplasm of respiratory organs: Secondary | ICD-10-CM

## 2021-08-15 ENCOUNTER — Encounter: Payer: Self-pay | Admitting: Cardiovascular Disease

## 2021-08-15 ENCOUNTER — Encounter: Payer: Self-pay | Admitting: Pulmonary Disease

## 2021-08-15 ENCOUNTER — Encounter: Payer: Self-pay | Admitting: Hematology and Oncology

## 2021-08-15 NOTE — Telephone Encounter (Signed)
Will forward to Dr. Erin Fulling for [facility].  ?

## 2021-08-16 ENCOUNTER — Ambulatory Visit (INDEPENDENT_AMBULATORY_CARE_PROVIDER_SITE_OTHER): Payer: Medicare Other | Admitting: Licensed Clinical Social Worker

## 2021-08-16 ENCOUNTER — Other Ambulatory Visit: Payer: Self-pay

## 2021-08-16 DIAGNOSIS — F330 Major depressive disorder, recurrent, mild: Secondary | ICD-10-CM | POA: Diagnosis not present

## 2021-08-16 DIAGNOSIS — F411 Generalized anxiety disorder: Secondary | ICD-10-CM | POA: Diagnosis not present
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
## 2021-08-18 ENCOUNTER — Encounter (HOSPITAL_COMMUNITY): Payer: Self-pay | Admitting: Licensed Clinical Social Worker

## 2021-08-30 ENCOUNTER — Encounter (HOSPITAL_COMMUNITY): Payer: Self-pay | Admitting: Licensed Clinical Social Worker

## 2021-08-30 ENCOUNTER — Other Ambulatory Visit (HOSPITAL_COMMUNITY): Payer: Self-pay | Admitting: *Deleted

## 2021-08-30 ENCOUNTER — Ambulatory Visit (INDEPENDENT_AMBULATORY_CARE_PROVIDER_SITE_OTHER): Payer: Medicare Other | Admitting: Licensed Clinical Social Worker

## 2021-08-30 DIAGNOSIS — F339 Major depressive disorder, recurrent, unspecified: Secondary | ICD-10-CM

## 2021-08-30 NOTE — Progress Notes (Signed)
? ?  THERAPIST PROGRESS NOTE ? ?Session Time: 8:10am-9:00am ? ?Participation Level: Active ? ?Behavioral Response: Well GroomedAlertIrritable ? ?Type of Therapy: Individual Therapy ? ?Treatment Goals addressed: to cope with stress and manage depression and anxiety". Achillies will report improved stress management and coping skills, resulting in mood stability and less worrying 4 out of 7 days. ? ?ProgressTowards Goals: Progressing ? ?Interventions: CBT ? ?Summary: Wen Merced is a 76 y.o. male who presents with MDD, chronic.  ? ?Suicidal/Homicidal: Nowithout intent/plan ? ?Therapist Response: Maico engaged well in in-person session with clinician. Clinician utilized CBT to process thoughts, feelings and behaviors as Isaiyah shared updates from last week. Callum continues to share struggles with technology, but was pleased to report he has solved many issues on his own and with help from the IT support desks. Clinician explored how much exposure Kayon gets to the news and to other stressors. Clinician discussed options for incorporating joy, fun, and laughter into daily activities. Clinician identified that Taras continues to work on his cartoons, but he does not do much socializing outside of his AA and NA meetings. Clinician offered options for increasing his involvement in social activities. Clinician also discussed the importance of finding joy in his days, as we know they are limited.  ? ?Plan: Return again in 2 weeks. ? ?Diagnosis: Major depression, recurrent, chronic (HCC) ? ?Collaboration of Care: Medication Management AEB discussed need for prior authorization due to pharmacy not approving the number of pills prescribed. Quentin Ore was notified and will contact the pharmacy. ? ?Patient/Guardian was advised Release of Information must be obtained prior to any record release in order to collaborate their care with an outside provider. Patient/Guardian was advised if they have not already done so to contact the  registration department to sign all necessary forms in order for [place] to release information regarding their care.  ? ?Consent: Patient/Guardian gives verbal consent for treatment and assignment of benefits for services provided during this visit. Patient/Guardian expressed understanding and agreed to proceed.  ? ?[name], LCSW ?08/30/2021 ? ?

## 2021-08-31 ENCOUNTER — Encounter: Payer: Self-pay | Admitting: Hematology and Oncology

## 2021-08-31 ENCOUNTER — Other Ambulatory Visit (HOSPITAL_COMMUNITY): Payer: Self-pay | Admitting: *Deleted

## 2021-08-31 MED ORDER — BUPROPION HCL ER (SR) 100 MG PO TB12: ORAL_TABLET | ORAL | 0 refills | Status: DC

## 2021-09-06 ENCOUNTER — Ambulatory Visit
Admission: RE | Admit: 2021-09-06 | Discharge: 2021-09-06 | Disposition: A | Discharge status: Home / Self Care | Payer: Medicare Other | Source: Ambulatory Visit | Attending: Family Medicine | Admitting: Family Medicine

## 2021-09-06 DIAGNOSIS — Z122 Encounter for screening for malignant neoplasm of respiratory organs: Secondary | ICD-10-CM

## 2021-09-07 ENCOUNTER — Encounter: Payer: Self-pay | Admitting: Hematology and Oncology

## 2021-09-07 ENCOUNTER — Encounter: Payer: Self-pay | Admitting: Cardiovascular Disease

## 2021-09-07 ENCOUNTER — Encounter: Payer: Self-pay | Admitting: Pulmonary Disease

## 2021-09-07 NOTE — Telephone Encounter (Signed)
Mychart message sent by pt in regards to recent lung cancer screen CT he had done. Pt wants to have JD review. Pt did post the written report in his mychart message but we are able to view the written report and all in his chart. ? ?Routing to Dr. Erin Fulling. Please advise. ?

## 2021-09-09 ENCOUNTER — Encounter: Payer: Self-pay | Admitting: Cardiovascular Disease

## 2021-09-13 ENCOUNTER — Ambulatory Visit (HOSPITAL_COMMUNITY): Payer: Medicare Other | Admitting: Licensed Clinical Social Worker

## 2021-09-16 ENCOUNTER — Ambulatory Visit (INDEPENDENT_AMBULATORY_CARE_PROVIDER_SITE_OTHER): Payer: Medicare Other

## 2021-09-16 ENCOUNTER — Other Ambulatory Visit: Payer: Self-pay | Admitting: Pulmonary Disease

## 2021-09-16 DIAGNOSIS — Z5181 Encounter for therapeutic drug level monitoring: Secondary | ICD-10-CM

## 2021-09-16 DIAGNOSIS — R0602 Shortness of breath: Secondary | ICD-10-CM

## 2021-09-16 DIAGNOSIS — Z7901 Long term (current) use of anticoagulants: Secondary | ICD-10-CM | POA: Diagnosis not present

## 2021-09-16 DIAGNOSIS — I482 Chronic atrial fibrillation, unspecified: Secondary | ICD-10-CM

## 2021-09-16 LAB — POCT INR: INR: 5 — AB (ref 2.0–3.0)

## 2021-09-16 NOTE — Telephone Encounter (Signed)
Changes Requested ? ? Name from pharmacy: INCRUSE ELLIPTA 62.5 MCG [place]  ?    ? Will file in chart as: INCRUSE ELLIPTA 62.5 MCG/ACT AEPB  ?  Possible duplicate: Hover to review recent actions on this medication  ? Sig: INHALE 1 PUFF BY MOUTH EVERY DAY  ? Disp:  30 each    Refills:  6  ? Start: 09/16/2021  ? Class: Normal  ? For: Shortness of breath  ? Last ordered: Today by Freddi Starr, MD Last refill: 09/16/2021  ? Rx #: [id number]  ? Pharmacy comment: Alternative Requested:I SENT A MESSAGE THIS MORNING ASKING FOR A REPLACEMENT TO THIS NOT A REFILL OR NEW SCRIPT PATIENT CANT AFFORD THE PRICE WITH HIS INSURANCE $220 AND HE WOULD LIKE A GENERIC ALTERNATIVE.  ?   ?To be filled at: [id number] [place], [place] - [address] ?  ? ? ?Routing to prior auth team to see what would be alternative. ?

## 2021-09-16 NOTE — Patient Instructions (Signed)
HOLD TODAY AND Saturday and then Continue 1 tablet daily except for 1/2 a tablet on Sunday, Tuesday and Friday. Recheck INR in 1 week. Coumadin Clinic [phone];  Eat greens tonight; ?

## 2021-09-16 NOTE — Telephone Encounter (Signed)
Changes Requested ? ? INCRUSE ELLIPTA 62.5 MCG/ACT AEPB  ?    ? Changed from: umeclidinium bromide (INCRUSE ELLIPTA) 62.5 MCG/INH AEPB  ? Sig: TAKE 1 PUFF BY MOUTH EVERY DAY  ? Disp:  30 each    Refills:  6  ? Start: 09/16/2021  ? Class: Normal  ? For: Shortness of breath  ? Last ordered: 8 months ago by Freddi Starr, MD Last refill: 09/16/2021  ? Rx #: [id number]  ? Pharmacy comment: Alternative Requested:PATIENT CAN NOT AFFORD THE COST OF THIS INHALER HE WOULD LIKE TO SEE IF YOU WOULD SWITCH THIS TO AN INHALER THAT IS GENERIC AND MORE AFFORDABLE TO HIM.  ?   ?To [facility] filled at: [id number] [place], [place] - [id number]  ?  ? ? ?Dr. DErin Fulling please advise if you were wanting pt to fully stop the Incruse and just do the Spiriva. ?

## 2021-09-20 ENCOUNTER — Other Ambulatory Visit (HOSPITAL_COMMUNITY): Payer: Self-pay

## 2021-09-20 NOTE — Telephone Encounter (Signed)
Called and spoke with pt letting him know the info we found out from our prior auth team about the inhalers.  Pt said when he was on the Spiriva prior to the Incruse and also when using the Incruse, he was just using them as needed. ? ?Due to him using the Incruse as needed, pt wanted to know if he might be able to be prescribed an albuterol inhaler to use as needed and not worry about having the Incruse refilled since he was just using it only as needed. ? ?Dr. Erin Fulling, please advise on this for pt. ?

## 2021-09-20 NOTE — Telephone Encounter (Signed)
Findings of benefits investigation via test claims  ?  ?Insurance: [employer] PART D ? ? ?SPIRIVA HANDIHALER - Is non-formulary prior authorization required. ? ?SPIRIVA RESPIMAT - Is non-formulary prior authorization required. ? ?TUDORZA - Is non-formulary prior authorization required. ? ?INCRUSE  - # 1 inhaler for a 1 month supply through patient's insurance is $ 270.80. (amount applied to deductible $247.89) ? ?Incruse is the only inhaler covered under patient's plan. Patient can apply for patient assistance under [place] for Incruse. ?

## 2021-09-21 MED ORDER — ALBUTEROL SULFATE HFA 108 (90 BASE) MCG/ACT IN AERS: 2.0000 | INHALATION_SPRAY | Freq: Four times a day (QID) | RESPIRATORY_TRACT | 6 refills | Status: DC | PRN

## 2021-09-21 NOTE — Telephone Encounter (Signed)
Called and spoke with pt letting him know that Dr. Erin Fulling said it was okay to prescribe an albuterol inhaler and he verbalized understanding. Rx has been sent to preferred pharmacy. Nothing further needed. ?

## 2021-09-23 ENCOUNTER — Ambulatory Visit (INDEPENDENT_AMBULATORY_CARE_PROVIDER_SITE_OTHER): Payer: Medicare Other

## 2021-09-23 DIAGNOSIS — Z7901 Long term (current) use of anticoagulants: Secondary | ICD-10-CM

## 2021-09-23 DIAGNOSIS — Z5181 Encounter for therapeutic drug level monitoring: Secondary | ICD-10-CM

## 2021-09-23 DIAGNOSIS — I482 Chronic atrial fibrillation, unspecified: Secondary | ICD-10-CM | POA: Diagnosis not present

## 2021-09-23 LAB — POCT INR: INR: 1.8 — AB (ref 2.0–3.0)

## 2021-09-23 NOTE — Patient Instructions (Signed)
TAKE 1 TABLET TONIGHT ONLY and then Continue 1 tablet daily except for 1/2 a tablet on Sunday, Tuesday and Friday. Recheck INR in 6 weeks. Coumadin Clinic [phone];  ?

## 2021-09-27 ENCOUNTER — Encounter (HOSPITAL_COMMUNITY): Payer: Self-pay | Admitting: Licensed Clinical Social Worker

## 2021-09-27 ENCOUNTER — Ambulatory Visit (INDEPENDENT_AMBULATORY_CARE_PROVIDER_SITE_OTHER): Payer: Medicare Other | Admitting: Licensed Clinical Social Worker

## 2021-09-27 DIAGNOSIS — F339 Major depressive disorder, recurrent, unspecified: Secondary | ICD-10-CM

## 2021-09-27 DIAGNOSIS — F411 Generalized anxiety disorder: Secondary | ICD-10-CM | POA: Diagnosis not present

## 2021-09-27 NOTE — Progress Notes (Signed)
? ?  THERAPIST PROGRESS NOTE ? ?Session Time: 9:00am-9:55am ? ?Participation Level: Active ? ?Behavioral Response: Well GroomedAlertAnxious and Depressed ? ?Type of Therapy: Individual Therapy ? ?Treatment Goals addressed: to cope with stress and manage depression and anxiety". Rodney Friedman will report improved stress management and coping skills, resulting in mood stability and less worrying 4 out of 7 days. ? ?ProgressTowards Goals: Progressing ? ?Interventions: CBT ? ?Summary: Rodney Friedman is a 76 y.o. male who presents with MDD, chronic and GAD.  ? ?Suicidal/Homicidal: Nowithout intent/plan ? ?Therapist Response: Degan engaged well in individual in person session with clinician. Clinician utilized CBT to process thoughts and feelings about daily stressors. Clinician discussed coping skills and positive self-talk. Clinician processed Rodney Friedman's progress in recovery and ongoing involvement in AA and NA. Clinician discussed Rodney Friedman's personal goal to "die sober". Clinician noted the lack of suicidality in Rodney Friedman's talk, noting the importance of being healthy, going to the doctor, and not smoking. Rodney Friedman shared that it is now 8 months since he had a cigarette. Clinician discussed coping with cravings and the positive self talk that helps with his ongoing progress.  ? ?Plan: Return again in 2 weeks. ? ?Diagnosis: Major depression, recurrent, chronic (HCC) ? ?GAD (generalized anxiety disorder) ? ?Collaboration of Care: Other none required in this session.  ? ?Patient/Guardian was advised Release of Information must be obtained prior to any record release in order to collaborate their care with an outside provider. Patient/Guardian was advised if they have not already done so to contact the registration department to sign all necessary forms in order for [place] to release information regarding their care.  ? ?Consent: Patient/Guardian gives verbal consent for treatment and assignment of benefits for services provided during this visit.  Patient/Guardian expressed understanding and agreed to proceed.  ? ?[name], LCSW ?09/27/2021 ? ?

## 2021-10-04 ENCOUNTER — Other Ambulatory Visit: Payer: Self-pay | Admitting: Urology

## 2021-10-04 DIAGNOSIS — M810 Age-related osteoporosis without current pathological fracture: Secondary | ICD-10-CM

## 2021-10-05 IMAGING — CT CT ANGIO CHEST
2 of 7 series · 15 of 36 positions shown · IV contrast (omnipaque)
Comparison: Chest CTA 12/17/2020. No prior CT the abdomen or
pelvis.

CLINICAL DATA: 75-year-old male with history of severe aortic
stenosis. Preprocedural study prior to potential transcatheter
aortic valve replacement (TAVR) procedure.

EXAM:
CT ANGIOGRAPHY CHEST, ABDOMEN AND PELVIS
TECHNIQUE: Multidetector CT imaging through the chest, abdomen and pelvis was
performed using the standard protocol during bolus administration of
intravenous contrast. Multiplanar reconstructed images and MIPs were
obtained and reviewed to evaluate the vascular anatomy.
CONTRAST:  95mL OMNIPAQUE IOHEXOL 350 MG/ML SOLN

[Series 3: ax thins · axial · 0.59mm/px · z∈[-749,-95]mm · 14 of 756 slices shown]
[im 51/756  lung]
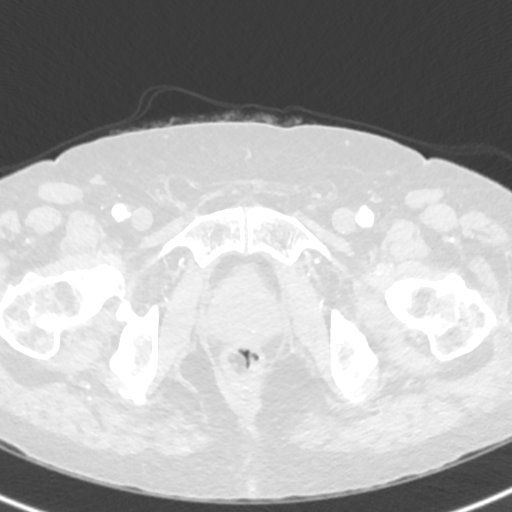
[im 101/756  mediastinal]
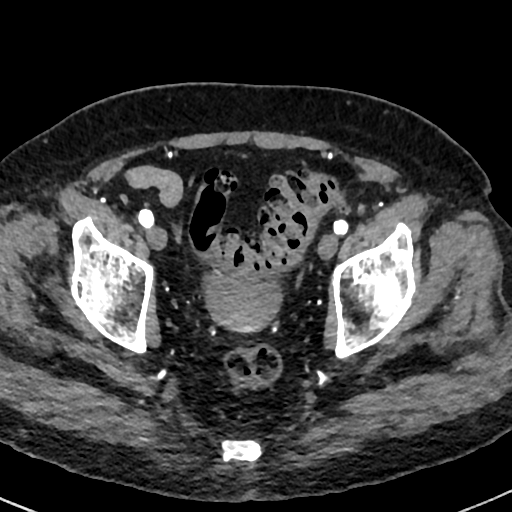
[im 152/756  lung]
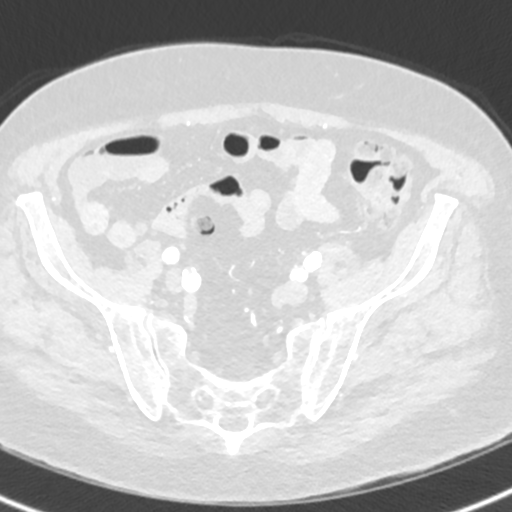
[im 202/756  mediastinal]
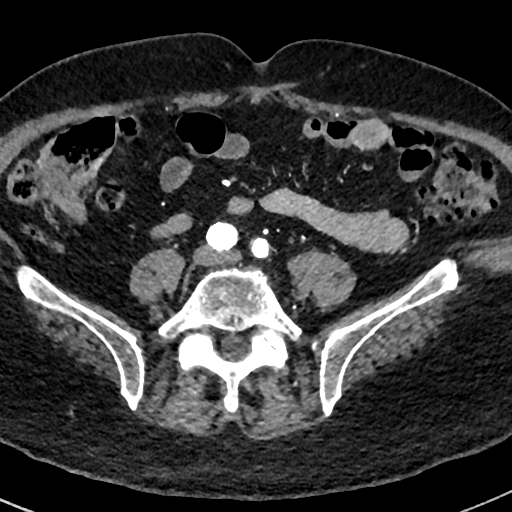
[im 252/756  lung]
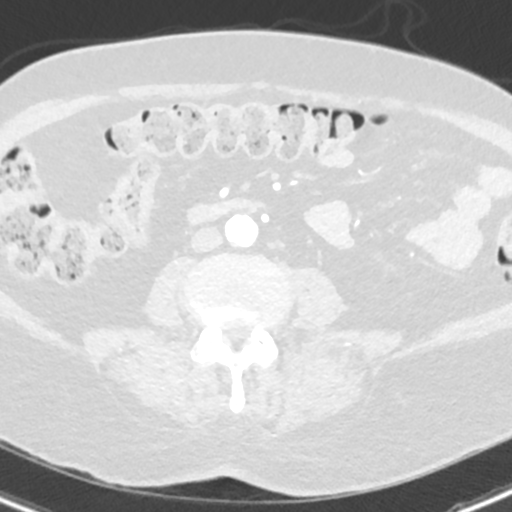
[im 303/756  mediastinal]
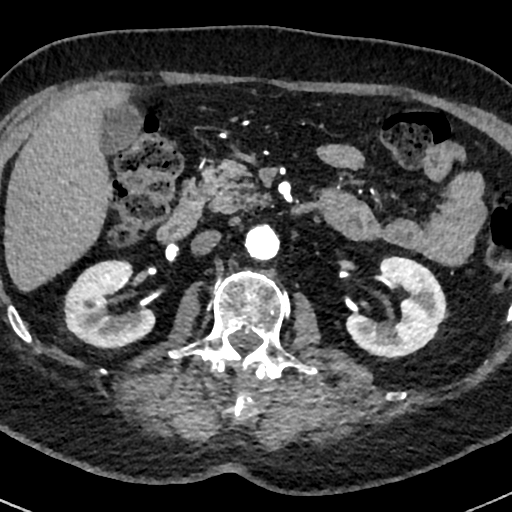
[im 353/756  lung]
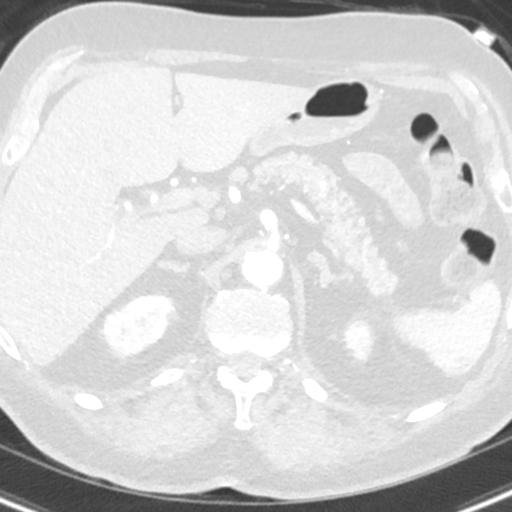
[im 403/756  mediastinal]
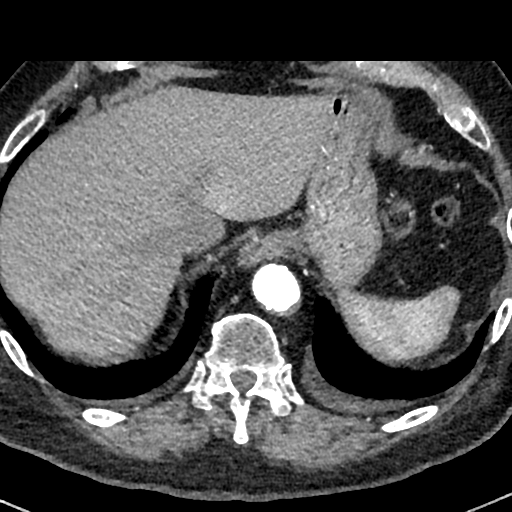
[im 454/756  lung]
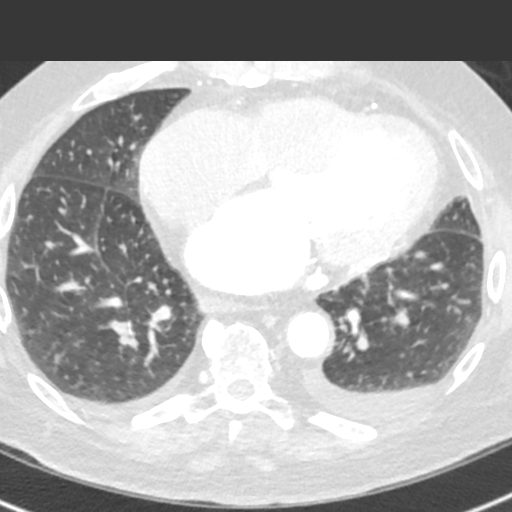
[im 504/756  mediastinal]
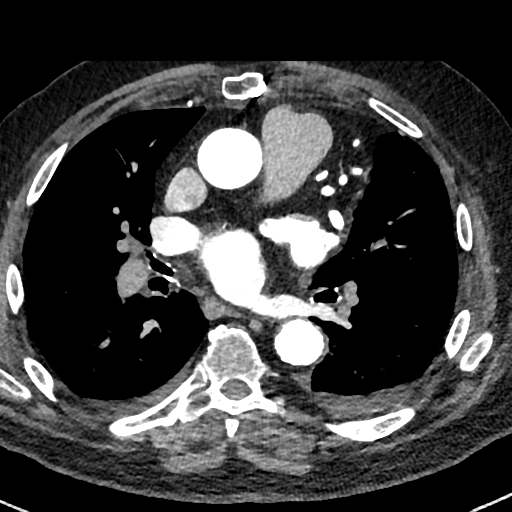
[im 554/756  lung]
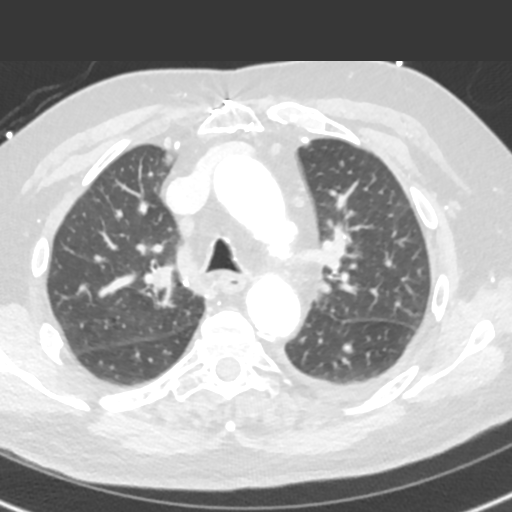
[im 605/756  mediastinal]
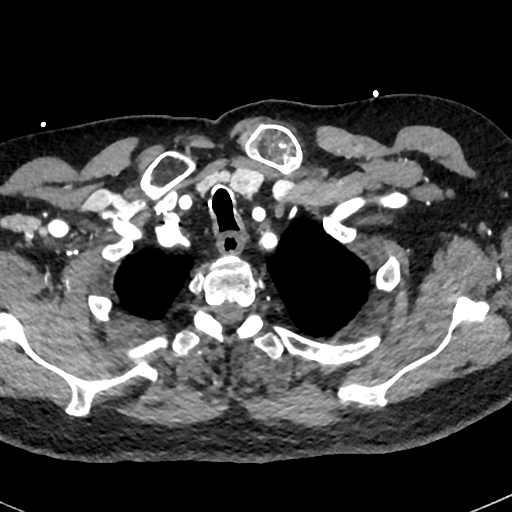
[im 655/756  lung]
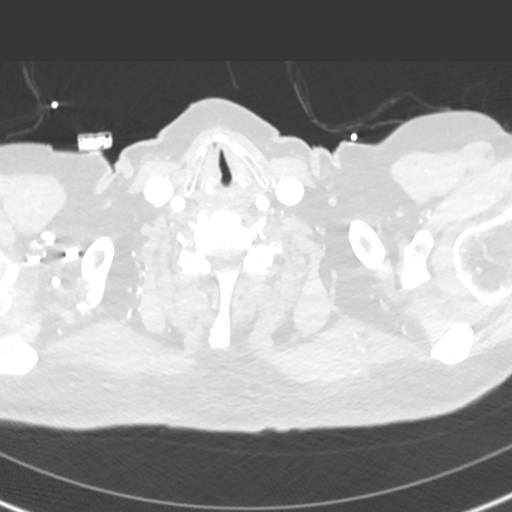
[im 705/756  mediastinal]
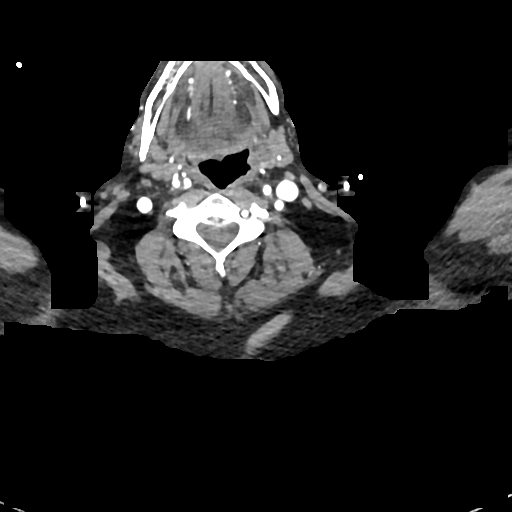

[Series 6: cor · coronal · 0.80mm/px · 1 of 137 slices shown]
[im 69/137  mediastinal]
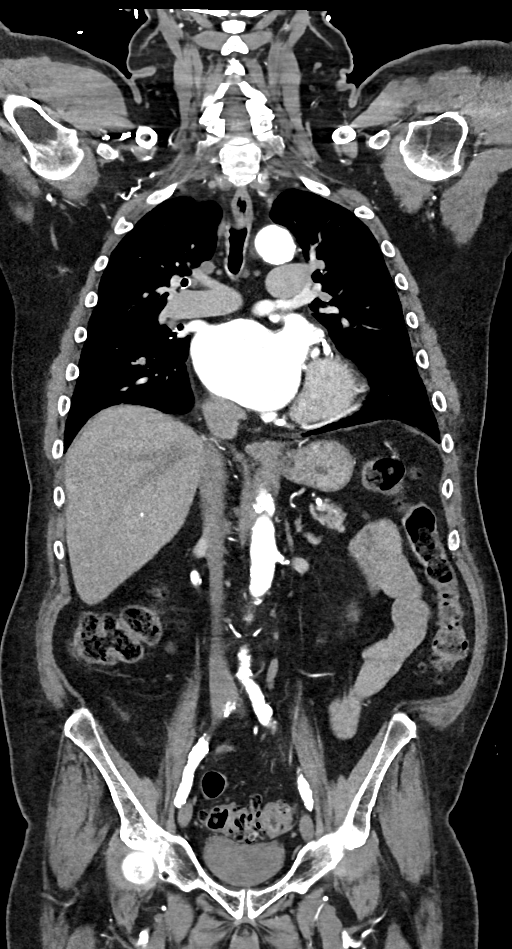

[15 of 36 positions shown; findings below may reference images not displayed]

FINDINGS: CTA CHEST FINDINGS

Cardiovascular: Heart size is enlarged with left atrial dilatation.
There is no significant pericardial fluid, thickening or pericardial
calcification. There is aortic atherosclerosis, as well as
atherosclerosis of the great vessels of the mediastinum and the
coronary arteries, including calcified atherosclerotic plaque in the
left main, left anterior descending, left circumflex and right
coronary arteries. Status post median sternotomy for CABG including
[REDACTED] to the LAD. Severe thickening and calcification of the aortic
valve. Severe calcifications of the mitral annulus.

Mediastinum/Lymph Nodes: No pathologically enlarged mediastinal or
hilar lymph nodes. Esophagus is unremarkable in appearance. No
axillary lymphadenopathy.

Lungs/Pleura: Small bilateral pleural effusions lying dependently.
No acute consolidative airspace disease. No suspicious appearing
pulmonary nodules or masses are noted.

Musculoskeletal/Soft Tissues: Median sternotomy wires. There are no
aggressive appearing lytic or blastic lesions noted in the
visualized portions of the skeleton.

CTA ABDOMEN AND PELVIS FINDINGS

Hepatobiliary: No suspicious cystic or solid hepatic lesions. No
intra or extrahepatic biliary ductal dilatation. Gallbladder is
normal in appearance.

Pancreas: No pancreatic mass. No pancreatic ductal dilatation. No
pancreatic or peripancreatic fluid collections or inflammatory
changes.

Spleen: Unremarkable.

Adrenals/Urinary Tract: Several low-attenuation lesions in both
kidneys compatible with simple cysts measuring up to 1.6 cm in
diameter in the lower pole of the right kidney and 1.8 cm in
diameter in the lower pole the left kidney. Other subcentimeter
low-attenuation lesions are also noted in both kidneys, too small to
characterize, but statistically likely to represent tiny cysts. No
hydroureteronephrosis. Urinary bladder is normal in appearance.
Bilateral adrenal glands are normal in appearance.

Stomach/Bowel: The appearance of the stomach is normal. No
pathologic dilatation of small bowel or colon. Numerous colonic
diverticulae are noted, without surrounding inflammatory changes to
suggest an acute diverticulitis at this time. Normal appendix.

Vascular/Lymphatic: Aortic atherosclerosis with vascular findings
and measurements pertinent to potential TAVR procedure, as detailed
below. Mild aneurysmal dilatation of the distal left common iliac
artery which measures up to 1.8 cm in diameter. No lymphadenopathy
noted in the abdomen or pelvis.

Reproductive: Prostate gland and seminal vesicles are unremarkable
in appearance.

Other: No significant volume of ascites.  No pneumoperitoneum.

Musculoskeletal: There are no aggressive appearing lytic or blastic
lesions noted in the visualized portions of the skeleton.

VASCULAR MEASUREMENTS PERTINENT TO TAVR:

AORTA:

Minimal Aortic Miameter-JE x 11 mm

Severity of Aortic Calcification-severe

RIGHT PELVIS:

Right Common Iliac Artery -

Minimal Iiameter-5.5 x 6.0 mm

Tortuosity-mild

Calcification-severe

Right External Iliac Artery -

Minimal Uiameter-W.C x 4.3 mm

Tortuosity - mild

Calcification - severe

Right Common Femoral Artery -

Minimal 2iameter-A.R x 3.6 mm

Tortuosity - mild

Calcification - severe

LEFT PELVIS:

Left Common Iliac Artery -

Minimal Wiameter-3.T x 6.7 mm

Tortuosity - mild

Calcification - severe

Left External Iliac Artery -

Minimal Kiameter-P.Z x 4.3 mm

Tortuosity - mild

Calcification - severe

Left Common Femoral Artery -

Minimal Uiameter-I.T x 4.5 mm

Tortuosity - mild

Calcification - severe

Review of the MIP images confirms the above findings.
IMPRESSION: 1. Vascular findings and measurements pertinent to potential TAVR
procedure, as detailed above.
2. Severe thickening calcification of the aortic valve, compatible
with reported clinical history of severe aortic stenosis.
3. Aortic atherosclerosis, in addition to left main and 3 vessel
coronary artery disease. Status post median sternotomy for CABG
including [REDACTED] to the LAD.
4. Small bilateral pleural effusions lying dependently.
5. Colonic diverticulosis without evidence of acute diverticulitis
at this time.
6. Additional incidental findings, as above.

## 2021-10-11 ENCOUNTER — Ambulatory Visit (INDEPENDENT_AMBULATORY_CARE_PROVIDER_SITE_OTHER): Payer: Medicare Other | Admitting: Licensed Clinical Social Worker

## 2021-10-11 DIAGNOSIS — F339 Major depressive disorder, recurrent, unspecified: Secondary | ICD-10-CM | POA: Diagnosis not present

## 2021-10-11 DIAGNOSIS — F411 Generalized anxiety disorder: Secondary | ICD-10-CM | POA: Diagnosis not present

## 2021-10-13 ENCOUNTER — Encounter (HOSPITAL_COMMUNITY): Payer: Self-pay | Admitting: Licensed Clinical Social Worker

## 2021-10-13 NOTE — Progress Notes (Signed)
   THERAPIST PROGRESS NOTE  Session Time: 8:00am-8:55am  Participation Level: Active  Behavioral Response: NeatAlertDepressed  Type of Therapy: Individual Therapy  Treatment Goals addressed:  to cope with stress and manage depression and anxiety". Kehinde will report improved stress management and coping skills, resulting in mood stability and less worrying 4 out of 7 days.  ProgressTowards Goals: Progressing  Interventions: CBT  Summary: Kylin Dubs is a 76 y.o. male who presents with MDD, chronic and GAD.   Suicidal/Homicidal: Nowithout intent/plan  Therapist Response: Nyjah engaged well in individual in person session. Clinician utilized CBT to process thoughts and feelings about relationships with family, his health, and concerns about the future. Clinician provided time and space for Fahd to process recent health concerns. Clinician challenged thoughts automatic thoughts about himself, his health, and his family. Clinician also explored positive updates in life. Clinician noted excitement about upcoming visit from daughter and grandchildren. Clinician also identified the positive attitude about cleaning the house and painting. Lonzy processed memories about his mother and father, noting that the long term impact of his mother's decisions have actually worked out quite well. Clinician encouraged Daisuke to continue work on releasing resentment of his mother.   Plan: Return again in 2 weeks.  Diagnosis: Major depression, recurrent, chronic (HCC)  GAD (generalized anxiety disorder)  Collaboration of Care: Other none required in this session.   Patient/Guardian was advised Release of Information must be obtained prior to any record release in order to collaborate their care with an outside provider. Patient/Guardian was advised if they have not already done so to contact the registration department to sign all necessary forms in order for [place] to release information regarding their care.    Consent: Patient/Guardian gives verbal consent for treatment and assignment of benefits for services provided during this visit. Patient/Guardian expressed understanding and agreed to proceed.   [place], LCSW 10/13/2021

## 2021-10-25 ENCOUNTER — Encounter (HOSPITAL_COMMUNITY): Payer: Self-pay | Admitting: Licensed Clinical Social Worker

## 2021-10-25 ENCOUNTER — Ambulatory Visit (INDEPENDENT_AMBULATORY_CARE_PROVIDER_SITE_OTHER): Payer: Medicare Other | Admitting: Licensed Clinical Social Worker

## 2021-10-25 DIAGNOSIS — F339 Major depressive disorder, recurrent, unspecified: Secondary | ICD-10-CM | POA: Diagnosis not present

## 2021-10-25 DIAGNOSIS — F411 Generalized anxiety disorder: Secondary | ICD-10-CM

## 2021-10-25 NOTE — Progress Notes (Signed)
   THERAPIST PROGRESS NOTE  Session Time: 8:00am-8:55am  Participation Level: Active  Behavioral Response: NeatAlertDepressed  Type of Therapy: Individual Therapy  Treatment Goals addressed: "to cope with stress and manage depression and anxiety". [name] will report improved stress management and coping skills, resulting in mood stability and less worrying 4 out of 7 days.  ProgressTowards Goals: Progressing  Interventions: CBT  Summary: Rodney Friedman is a 75 y.o. male who presents with MDD,chronic and GAD.   Suicidal/Homicidal: Nowithout intent/plan  Therapist Response: Yandiel engaged well in individual in person session with clinician. Clinician explored current interactions and mood sxs. Clinician utilized CBT to process thoughts, feelings, and behaviors. Clinician explored some "walks down memory lane", some that have brought tears. Clinician normalized these tears and encouraged [name] to give himself permission to let go of those memories. Clinician discussed relationships with children and processed excitement over upcoming visit from daughter and grandchildren. Clinician discussed recent frustration over having to do his own case management and advocacy. Clinician utilized CBT reality testing about this experience. Clinician also identified the importance of feeling proud of himself for working these issues out on his own.   Plan: Return again in 2 weeks.  Diagnosis: Major depression, recurrent, chronic (HCC)  GAD (generalized anxiety disorder)  Collaboration of Care: Other none required in this session.   Patient/Guardian was advised Release of Information must be obtained prior to any record release in order to collaborate their care with an outside provider. Patient/Guardian was advised if they have not already done so to contact the registration department to sign all necessary forms in order for [place] to release information regarding their care.   Consent: Patient/Guardian  gives verbal consent for treatment and assignment of benefits for services provided during this visit. Patient/Guardian expressed understanding and agreed to proceed.   [name], LCSW 10/25/2021

## 2021-11-01 ENCOUNTER — Encounter: Payer: Self-pay | Admitting: Cardiovascular Disease

## 2021-11-04 ENCOUNTER — Ambulatory Visit (INDEPENDENT_AMBULATORY_CARE_PROVIDER_SITE_OTHER): Payer: Medicare Other | Admitting: *Deleted

## 2021-11-04 DIAGNOSIS — Z7901 Long term (current) use of anticoagulants: Secondary | ICD-10-CM | POA: Diagnosis not present

## 2021-11-04 DIAGNOSIS — I482 Chronic atrial fibrillation, unspecified: Secondary | ICD-10-CM | POA: Diagnosis not present

## 2021-11-04 LAB — POCT INR: INR: 2.3 (ref 2.0–3.0)

## 2021-11-04 NOTE — Patient Instructions (Signed)
Description   Continue taking 1 tablet daily except for 1/2 tablet on Sunday, Tuesday and Friday. Recheck INR in 6 weeks. [facility]in Clinic [phone]      

## 2021-11-08 ENCOUNTER — Ambulatory Visit (HOSPITAL_COMMUNITY): Payer: Self-pay | Admitting: Licensed Clinical Social Worker

## 2021-11-15 ENCOUNTER — Ambulatory Visit (HOSPITAL_COMMUNITY): Payer: 59 | Admitting: Licensed Clinical Social Worker

## 2021-11-29 ENCOUNTER — Ambulatory Visit (HOSPITAL_BASED_OUTPATIENT_CLINIC_OR_DEPARTMENT_OTHER): Payer: Medicare Other | Admitting: Psychiatry

## 2021-11-29 DIAGNOSIS — F324 Major depressive disorder, single episode, in partial remission: Secondary | ICD-10-CM | POA: Diagnosis not present

## 2021-11-29 DIAGNOSIS — I25709 Atherosclerosis of coronary artery bypass graft(s), unspecified, with unspecified angina pectoris: Secondary | ICD-10-CM

## 2021-11-29 MED ORDER — GABAPENTIN 100 MG PO CAPS: ORAL_CAPSULE | ORAL | 1 refills | Status: DC

## 2021-11-29 MED ORDER — BUPROPION HCL ER (SR) 100 MG PO TB12
ORAL_TABLET | ORAL | 0 refills | Status: DC
Start: 2021-11-29 — End: 2022-03-07

## 2021-11-29 NOTE — Progress Notes (Signed)
Psychiatric Initial Adult Assessment   Patient Identification: Rodney Friedman MRN:  [id number] Date of Evaluation:  11/29/2021 Referral Source: Dr. Nyoka Cowden Chief Complaint: Need follow-up care   Visit Diagnosis: Bipolar disorder, substance use disorder Bipolar disorder  History of Present Illness:  Today the patient is seen in the office by himself.  He is actually doing fairly well.  He is bothered a lot by bone scan was those results.  He has got osteoporosis and a number of degenerative problems.  Patient continues taking his medicines as prescribed.  He continues in one-to-one therapy.  He continues going to NA and AA and he does great with his sobriety.  He recently saw his grandchildren from [place] and had a great time for Father's Day.  Generally the patient is fairly stable.  He is physically limited.  He has a lot of back pain.  Substance Abuse History in the last 12 months:  Yes.    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 MAngelena Form& Dr. 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    Past Surgical History:  Procedure Laterality Date   Arm Surgery Right    from door accident with glass   CARDIAC CATHETERIZATION     CATARACT EXTRACTION W/ INTRAOCULAR LENS IMPLANT Bilateral    COLONOSCOPY     CORONARY ARTERY BYPASS GRAFT  08/04/2004   EYE SURGERY     POLYPECTOMY     RIGHT/LEFT HEART CATH AND CORONARY/GRAFT ANGIOGRAPHY N/A 01/20/2021   Procedure: RIGHT/LEFT HEART CATH AND CORONARY/GRAFT ANGIOGRAPHY;  Surgeon: MBurnell Blanks MD;  [facility];  Service: Cardiovascular;  Laterality: N/A;   TEE WITHOUT CARDIOVERSION N/A 02/08/2021   Procedure: TRANSESOPHAGEAL ECHOCARDIOGRAM (TEE);  Surgeon: MBurnell Blanks MD;  [facility];  Service: Open Heart Surgery;  Laterality: N/A;   TRANSCATHETER AORTIC VALVE REPLACEMENT, CAROTID Left 02/08/2021   Procedure: TRANSCATHETER AORTIC VALVE REPLACEMENT, LEFT CAROTID;  Surgeon: MBurnell Blanks MD;  [facility];  Service: Open Heart Surgery;  Laterality: Left;    Family Psychiatric History:   Family History:  Family History  Problem Relation Age of Onset   Colon cancer Mother    Ovarian cancer Mother    Uterine cancer Mother  Hypertension Father    Prostate cancer Father    Colon polyps Neg Hx     Social History:   Social History   Socioeconomic History   Marital status: Divorced    Spouse name: Not on file   Number of children: 2   Years of education: 16   Highest education level: Not on file  Occupational History   Occupation: Retired Pharmacist, hospital  Tobacco Use   Smoking status: Former    Packs/day: 0.50    Years: 50.00    Total pack years: 25.00    Types: Cigarettes    Quit date: 12/16/2020    Years since quitting: 0.9   Smokeless tobacco: Never  Vaping Use   Vaping Use: Never used  Substance and Sexual Activity   Alcohol use: No     Alcohol/week: 0.0 standard drinks of alcohol   Drug use: No   Sexual activity: Not Currently  Other Topics Concern   Not on file  Social History Narrative   Lives alone   Divorced   Right-handed   Education: College   No caffeine   Social Determinants of Health   Financial Resource Strain: Not on file  Food Insecurity: Not on file  Transportation Needs: Not on file  Physical Activity: Not on file  Stress: Not on file  Social Connections: Not on file    Additional Social History:   Allergies:   Allergies  Allergen Reactions   Other Rash    Allergen: "Plants and bushes while doing yard work"    Metabolic Disorder Labs: Lab Results  Component Value Date   HGBA1C 6.1 (H) 12/17/2020   MPG 128 12/17/2020   MPG 117 02/11/2016   No results found for: "PROLACTIN" Lab Results  Component Value Date   CHOL 119 04/26/2020   TRIG 120 04/26/2020   HDL 37 (L) 04/26/2020   CHOLHDL 3.2 04/26/2020   VLDL 41 (H) 03/10/2016   LDLCALC 60 04/26/2020   LDLCALC 57 10/27/2019     Current Medications: Current Outpatient Medications  Medication Sig Dispense Refill   albuterol (VENTOLIN HFA) 108 (90 Base) MCG/ACT inhaler Inhale 2 puffs into the lungs every 6 (six) hours as needed for wheezing or shortness of breath. 8 g 6   amoxicillin (AMOXIL) 500 MG tablet Take 4 tablets (2000 mg) by mouth ONE HOUR before any dental procedures. (Patient not taking: Reported on 04/29/2021) 12 tablet 4   B Complex Vitamins (VITAMIN-B COMPLEX PO) Take 1 tablet by mouth daily with supper.     BIOTIN PO Take 1 tablet by mouth every evening.     buPROPion ER (WELLBUTRIN SR) 100 MG 12 hr tablet 2  qam   1  q diner 270 tablet 0   Calcium Carbonate-Vit D-Min (CALCIUM 1200 PO) Take 1,200 mg by mouth every evening.     calcium elemental as carbonate (TUMS ULTRA 1000) 400 MG chewable tablet Chew 1,000 mg by mouth daily as needed for heartburn. (Patient not taking: Reported on 04/29/2021)     docusate sodium  (COLACE) 100 MG capsule Take 100 mg by mouth every evening.     ezetimibe (ZETIA) 10 MG tablet Take 1 tablet (10 mg total) by mouth daily. 90 tablet 3   furosemide (LASIX) 20 MG tablet Take 1 tablet (20 mg total) by mouth daily as needed (weight gain of 3 pounds or more overnight or 5 pounds in a week). (Patient not taking: Reported on 04/29/2021) 90 tablet 3   gabapentin (NEURONTIN) 100 MG  capsule 3  qhs 270 capsule 1   ketoconazole (NIZORAL) 2 % shampoo Apply 1 application topically 2 (two) times a week. 120 mL 3   latanoprost (XALATAN) 0.005 % ophthalmic solution Place 1 drop into the left eye every other day.     LYCOPENE PO Take 10 mg by mouth every other day.     Magnesium 250 MG TABS Take 250 mg by mouth every 3 (three) days.     metFORMIN (GLUCOPHAGE) 500 MG tablet Take 1 tablet (500 mg total) by mouth 2 (two) times daily with a meal. 180 tablet 2   metoprolol (TOPROL-XL) 50 MG 24 hr tablet Take 1 tablet (50 mg total) by mouth 2 (two) times daily. Take with or immediately following a meal. 30 tablet 0   Omega-3 Fatty Acids (FISH OIL) 1000 MG CAPS Take 1,000 mg by mouth daily with supper.     rosuvastatin (CRESTOR) 20 MG tablet Take 1 tablet (20 mg total) by mouth daily. 90 tablet 3   Sodium Fluoride (PREVIDENT 5000 DRY MOUTH DT) Place 1 application onto teeth See admin instructions. Apply to gums every night for dry mouth     vitamin B-12 (CYANOCOBALAMIN) 500 MCG tablet Take 500 mcg by mouth every other day.     vitamin C (ASCORBIC ACID) 500 MG tablet Take 500-1,000 mg by mouth See admin instructions. Alternate taking 500 mg one day and 1000 mg the next     Vitamin D, Ergocalciferol, (DRISDOL) 1.25 MG (50000 UNIT) CAPS capsule Take 50,000 Units by mouth every Saturday.     warfarin (COUMADIN) 4 MG tablet TAKE 1/2 TO 1 TABLET DAILY AS DIRECTED BY COUMADIN CLINIC 90 tablet 1   No current facility-administered medications for this visit.    Neurologic: Headache: No Seizure:  No Paresthesias:No  Musculoskeletal: Strength & Muscle Tone: within normal limits Gait & Station: normal Patient leans: Right  Psychiatric Specialty Exam: ROS  There were no vitals taken for this visit.There is no height or weight on file to calculate BMI.  General Appearance: Casual  Eye Contact:  Good  Speech:  Clear and Coherent  Volume:  Normal  Mood:  NA  Affect:  Appropriate  Thought Process:  Goal Directed  Orientation:  NA  Thought Content:  WDL  Suicidal Thoughts:  No  Homicidal Thoughts:  No  Memory:  Negative  Judgement:  Good  Insight:  Good  Psychomotor Activity:  Normal  Concentration:    Recall:  Gap of Knowledge:Good  Language: Good  Akathisia:  No  Handed:  Right  AIMS (if indicated):    Assets:  Desire for Improvement  ADL's:  Intact  Cognition: WNL  Sleep:      Treatment Plan Summary: 7/11/20235:04 PM   This patient's diagnosis is major depression.  He continues taking Wellbutrin as prescribed.  He also takes Neurontin which I think is important for substance abuse issues.  He has a history of alcohol and opiate misuse.  Patient continues in one-to-one therapy.  Patient is actually quite stable.  He has some back pain which is a big issue at this time.

## 2021-11-30 ENCOUNTER — Ambulatory Visit (HOSPITAL_COMMUNITY): Payer: Medicare Other | Admitting: Psychiatry

## 2021-12-01 ENCOUNTER — Encounter: Payer: Self-pay | Admitting: Hematology and Oncology

## 2021-12-06 ENCOUNTER — Ambulatory Visit (INDEPENDENT_AMBULATORY_CARE_PROVIDER_SITE_OTHER): Payer: Medicare Other | Admitting: Licensed Clinical Social Worker

## 2021-12-06 DIAGNOSIS — F339 Major depressive disorder, recurrent, unspecified: Secondary | ICD-10-CM | POA: Diagnosis not present

## 2021-12-06 DIAGNOSIS — F411 Generalized anxiety disorder: Secondary | ICD-10-CM | POA: Diagnosis not present

## 2021-12-07 ENCOUNTER — Encounter (HOSPITAL_COMMUNITY): Payer: Self-pay | Admitting: Licensed Clinical Social Worker

## 2021-12-07 ENCOUNTER — Ambulatory Visit (HOSPITAL_COMMUNITY): Payer: Medicare Other | Admitting: Psychiatry

## 2021-12-07 NOTE — Progress Notes (Signed)
   THERAPIST PROGRESS NOTE  Session Time: 8:00am-8:55am  Participation Level: Active  Behavioral Response: NeatAlertAnxious and Depressed  Type of Therapy: Individual Therapy  Treatment Goals addressed:  "to cope with stress and manage depression and anxiety". [name] will report improved stress management and coping skills, resulting in mood stability and less worrying 4 out of 7 days.  ProgressTowards Goals: Progressing  Interventions: CBT  Summary: Rodney Friedman is a 76 y.o. male who presents with MDD Chronic and GAD.   Suicidal/Homicidal: Nowithout intent/plan  Therapist Response: Rodney Friedman engaged well in in-person session with clinician. Clinician utilized CBT to process updates in mood, thought processes and interactions. Rodney Friedman shared joy and fulfillment following his visit with his daughter and grandchildren. Clinician explored plans for future visits and noted that they likely would not come back for a year. Clinician discussed Rodney Friedman's updated health issues and reflected his increased fears and depression about his future. Clinician explored options regarding his family caring for him and being closer with daughter. Rodney Friedman shared a conversation about the future and his care with daughter, noting that he does not want to move back to [facility] because he is settled and comfortable with his care and life here. Clinician discussed the importance of taking one day at a time and being able to focus on positive aspects of the future.    Plan: Return again in 3 weeks.  Diagnosis: Major depression, recurrent, chronic (HCC)  GAD (generalized anxiety disorder)  Collaboration of Care: Other none required  Patient/Guardian was advised Release of Information must be obtained prior to any record release in order to collaborate their care with an outside provider. Patient/Guardian was advised if they have not already done so to contact the registration department to sign all necessary forms in order for [place]  to release information regarding their care.   Consent: Patient/Guardian gives verbal consent for treatment and assignment of benefits for services provided during this visit. Patient/Guardian expressed understanding and agreed to proceed.   Rodney Marker [place], LCSW 12/07/2021

## 2021-12-13 ENCOUNTER — Other Ambulatory Visit: Payer: Self-pay | Admitting: Physician Assistant

## 2021-12-13 DIAGNOSIS — Z952 Presence of prosthetic heart valve: Secondary | ICD-10-CM

## 2021-12-16 ENCOUNTER — Ambulatory Visit (INDEPENDENT_AMBULATORY_CARE_PROVIDER_SITE_OTHER): Payer: Medicare Other

## 2021-12-16 DIAGNOSIS — I482 Chronic atrial fibrillation, unspecified: Secondary | ICD-10-CM

## 2021-12-16 DIAGNOSIS — Z7901 Long term (current) use of anticoagulants: Secondary | ICD-10-CM

## 2021-12-16 LAB — POCT INR: INR: 1.6 — AB (ref 2.0–3.0)

## 2021-12-16 NOTE — Patient Instructions (Signed)
Description   Take 1 tablet today and take 1.5 tablets tomorrow and then continue taking 1 tablet daily except for 1/2 tablet on Sunday, Tuesday and Friday. Recheck INR in 4 weeks. Coumadin Clinic [phone]

## 2021-12-17 ENCOUNTER — Encounter: Payer: Self-pay | Admitting: Cardiovascular Disease

## 2021-12-27 ENCOUNTER — Ambulatory Visit (INDEPENDENT_AMBULATORY_CARE_PROVIDER_SITE_OTHER): Payer: Medicare Other | Admitting: Licensed Clinical Social Worker

## 2021-12-27 DIAGNOSIS — F339 Major depressive disorder, recurrent, unspecified: Secondary | ICD-10-CM

## 2021-12-27 DIAGNOSIS — F411 Generalized anxiety disorder: Secondary | ICD-10-CM

## 2021-12-28 ENCOUNTER — Encounter (HOSPITAL_COMMUNITY): Payer: Self-pay | Admitting: Licensed Clinical Social Worker

## 2021-12-28 NOTE — Progress Notes (Signed)
   THERAPIST PROGRESS NOTE  Session Time: 8:00am-9:00am  Participation Level: Active  Behavioral Response: Well GroomedAlertIrritable  Type of Therapy: Individual Therapy  Treatment Goals addressed:   "to cope with stress and manage depression and anxiety". Rodney Friedman will report improved stress management and coping skills, resulting in mood stability and less worrying 4 out of 7 days.  ProgressTowards Goals: Progressing  Interventions: CBT  Summary: Rodney Friedman is a 76 y.o. male who presents with MDD chronic and GAD.   Suicidal/Homicidal: NAwithout intent/plan  Therapist Response: Rodney Friedman engaged well in individual in person therapy. Clinician utilized CBT to process current stressors, including health care and transportation. Clinician explored options for coping and strategizing his attempts to coordinate his health care. Clinician also noted the tendency toward projection and catastrophizing regarding his health, his providers, and ensuring that he is getting quality care. Clinician provided time and space for Rodney Friedman to share these concerns and validated each one. Clinician also identified that the challenges he is encountering are systemic, which means the most likely way to get what he needs is to be his own health care advocate. Rodney Friedman identified his own strengths and noted that things would be much different if he were not computer savvy and could not transport himself to these appointments.   Plan: Return again in 2 weeks.  Diagnosis: Major depression, recurrent, chronic (HCC)  GAD (generalized anxiety disorder)  Collaboration of Care: Other none required in this session  Patient/Guardian was advised Release of Information must be obtained prior to any record release in order to collaborate their care with an outside provider. Patient/Guardian was advised if they have not already done so to contact the registration department to sign all necessary forms in order for [place] to release  information regarding their care.   Consent: Patient/Guardian gives verbal consent for treatment and assignment of benefits for services provided during this visit. Patient/Guardian expressed understanding and agreed to proceed.   Jobe Marker [place], LCSW 12/28/2021

## 2022-01-10 LAB — HM DIABETES EYE EXAM

## 2022-01-13 ENCOUNTER — Encounter: Payer: Self-pay | Admitting: Cardiovascular Disease

## 2022-01-13 ENCOUNTER — Ambulatory Visit (INDEPENDENT_AMBULATORY_CARE_PROVIDER_SITE_OTHER): Payer: Medicare Other

## 2022-01-13 DIAGNOSIS — I482 Chronic atrial fibrillation, unspecified: Secondary | ICD-10-CM

## 2022-01-13 DIAGNOSIS — Z5181 Encounter for therapeutic drug level monitoring: Secondary | ICD-10-CM

## 2022-01-13 DIAGNOSIS — Z7901 Long term (current) use of anticoagulants: Secondary | ICD-10-CM

## 2022-01-13 LAB — POCT INR: INR: 3.1 — AB (ref 2.0–3.0)

## 2022-01-13 NOTE — Patient Instructions (Signed)
continue taking 1 tablet daily except for 1/2 tablet on Sunday, Tuesday and Friday. Recheck INR in 6 weeks. Coumadin Clinic [phone]

## 2022-01-17 ENCOUNTER — Ambulatory Visit (INDEPENDENT_AMBULATORY_CARE_PROVIDER_SITE_OTHER): Payer: Medicare Other | Admitting: Licensed Clinical Social Worker

## 2022-01-17 ENCOUNTER — Encounter (HOSPITAL_COMMUNITY): Payer: Self-pay | Admitting: Licensed Clinical Social Worker

## 2022-01-17 DIAGNOSIS — F339 Major depressive disorder, recurrent, unspecified: Secondary | ICD-10-CM | POA: Diagnosis not present

## 2022-01-17 DIAGNOSIS — F411 Generalized anxiety disorder: Secondary | ICD-10-CM | POA: Diagnosis not present

## 2022-01-17 NOTE — Progress Notes (Signed)
   THERAPIST PROGRESS NOTE  Session Time: 8:00am-8:55am  Participation Level: Active  Behavioral Response: NeatAlertIrritable  Type of Therapy: Individual Therapy  Treatment Goals addressed:  "to cope with stress and manage depression and anxiety". Keishawn will report improved stress management and coping skills, resulting in mood stability and less worrying 4 out of 7 days.  ProgressTowards Goals: Progressing  Interventions: CBT  Summary: Wetzel Meester is a 76 y.o. male who presents with MDD Chronic and GAD.   Suicidal/Homicidal: Nowithout intent/plan  Therapist Response: Rito engaged well in individual in person session with clinician. Clinician utilized CBT to process thoughts, feelings, and behaviors. Emrah processed through recent health issues, concerns, and his own case management with his doctors and Medicare. Clinician explored options for transitioning back into the [facility] system for continuity and ease. Clinician processed concerns about son as well. Clinician explored coping skills and reminded Gurinder of his history of managing stress and completing tasks required in his situation.   Plan: Return again in 2 weeks.  Diagnosis: Major depression, recurrent, chronic (HCC)  GAD (generalized anxiety disorder)  Collaboration of Care: Other none required in this session  Patient/Guardian was advised Release of Information must be obtained prior to any record release in order to collaborate their care with an outside provider. Patient/Guardian was advised if they have not already done so to contact the registration department to sign all necessary forms in order for [place] to release information regarding their care.   Consent: Patient/Guardian gives verbal consent for treatment and assignment of benefits for services provided during this visit. Patient/Guardian expressed understanding and agreed to proceed.   Jobe Marker Lupton, LCSW 01/17/2022

## 2022-01-23 ENCOUNTER — Other Ambulatory Visit: Payer: Self-pay | Admitting: Cardiovascular Disease

## 2022-01-23 DIAGNOSIS — I25709 Atherosclerosis of coronary artery bypass graft(s), unspecified, with unspecified angina pectoris: Secondary | ICD-10-CM

## 2022-01-25 NOTE — Telephone Encounter (Signed)
Request for warfarin refill:  Last INR was 3.1 on 01/13/22 Next INR due on 02/24/22 LOV was 04/29/21  Jerilynn Mages Croitoru MD  Refill approved

## 2022-01-26 NOTE — Progress Notes (Unsigned)
[facility] [place]                                     Cardiology Office Note:    Date:  01/27/2022   ID:  Rodney Friedman, DOB 23-Jun-1945, MRN [id number]  PCP:  Kristie Cowman, MD  [facility] Cardiologist:  Sanda Klein, MD/ Dr. Angelena Form & Dr. Cyndia Bent (TAVR) [facility] Electrophysiologist:  None   Referring MD: Kristie Cowman, MD   1 year s/p TAVR  History of Present Illness:    Rodney Friedman is a 76 y.o. male with a hx of CAD s/p 3V CABG in 2006, persistent atrial fibrillation on Coumadin, carotid artery disease, HTN, HLD, DM type 2, former etoh abuse (quit in 2002), former tobacco abuse, prostate cancer in remission, CLL, sleep apnea and severe aortic stenosis s/p TAVR who presents today for follow up.    Echocardiogram on 12/17/2020 showed progression to severe AS. L/RHC on 01/20/21 showed severe triple vessel CAD s/p 3V CABG with 3/3 patent bypass grafts. He was admitted after his cath for shortness of breath and diuresis. He was then readmitted 9/5-01/25/21 for acute CHF in the setting of missing some medications.    He was evaluated by the multidisciplinary valve team and underwent successful TAVR with a 26 mm Edwards Sapien 3 Ultra THV via the left carotid approach on 02/08/21. Post operative echo showed EF 45-50%, trivial central AR with a normally functioning TAVR with a mean gradient 13 mm hg as well as severe degenerative MR>better seen with TEE likely ischemic with severe MAC and restricted posterior leaflet motion (does not appear to be clippable). Resumed on home Coumadin. He had a big clinical improvement since TAVR. Lasix changed to PRN. 1 month echo showed EF 50-55%, normally functioning TAVR with a mean gradient of 7 mm hg and no PVL as well as moderate to severe MAC with moderate MR.   Last seen by Dr. Sallyanne Kuster 04/2021 and doing well. Had quit smoking.   Today the patient presents to clinic for follow up.No CP or SOB. No LE  edema, orthopnea or PND. No dizziness or syncope. No blood in stool or urine. No palpitations. Still quit smoking. Has some questions regarding Prolia and dental work that I was unable to answer.   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 Pt says it causes abnormal EKGs   HLD (hyperlipidemia)    Hypertension    Leukemia ([place])    CLL   Myocardial infarction ([place]) 2006   Osteopenia    Osteoporosis    Prostate cancer ([place]) 07/08/2013   [facility] at [facility]- getting Lupron shot every 6 months - traces in prostate and 2 lymphnodes  left hip- non focused traces per pt    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   s/p TAVR with a 93m Edwards S3U via the TF approach by Dr. MAngelena Form& Dr. BCyndia Bent  Sleep apnea    Substance abuse ([facility]    Tremor, essential 09/22/2015    Past Surgical History:  Procedure Laterality Date   Arm Surgery Right    from door accident with glass   CARDIAC CATHETERIZATION     CATARACT EXTRACTION W/ INTRAOCULAR LENS IMPLANT Bilateral    COLONOSCOPY     CORONARY ARTERY BYPASS GRAFT  08/04/2004   EYE SURGERY     POLYPECTOMY     RIGHT/LEFT HEART CATH AND CORONARY/GRAFT ANGIOGRAPHY N/A 01/20/2021   Procedure: RIGHT/LEFT HEART CATH AND CORONARY/GRAFT ANGIOGRAPHY;  Surgeon: MBurnell Blanks MD;  [facility];  Service: Cardiovascular;  Laterality: N/A;   TEE WITHOUT  CARDIOVERSION N/A 02/08/2021   Procedure: TRANSESOPHAGEAL ECHOCARDIOGRAM (TEE);  Surgeon: MBurnell Blanks MD;  [facility];  Service: Open Heart Surgery;  Laterality: N/A;   TRANSCATHETER AORTIC VALVE REPLACEMENT, CAROTID Left 02/08/2021   Procedure: TRANSCATHETER AORTIC VALVE REPLACEMENT, LEFT CAROTID;  Surgeon: MBurnell Blanks MD;  [facility];  Service: Open Heart Surgery;  Laterality: Left;    Current Medications: Current Meds  Medication Sig   albuterol (VENTOLIN HFA) 108 (90 Base) MCG/ACT inhaler Inhale 2 puffs into the lungs every 6 (six) hours as needed for wheezing or shortness of breath.   B Complex Vitamins (VITAMIN-B COMPLEX PO) Take 1 tablet by mouth daily with supper.   BIOTIN PO Take 1 tablet by mouth every evening.   buPROPion ER (WELLBUTRIN SR) 100 MG 12 hr tablet 2  qam   1  q diner   Calcium Carbonate-Vit D-Min (CALCIUM 1200 PO) Take 1,200 mg by mouth every evening.   calcium elemental as carbonate (TUMS ULTRA 1000) 400 MG chewable tablet Chew 1,000 mg by mouth daily as needed for heartburn.   docusate sodium (COLACE) 100 MG capsule Take 100 mg by mouth every evening.   ezetimibe (ZETIA) 10 MG tablet Take 1 tablet (10 mg total) by mouth daily.   gabapentin (NEURONTIN) 100 MG capsule 3  qhs   ketoconazole (NIZORAL) 2 % shampoo Apply 1 application topically 2 (two) times a week.   latanoprost (XALATAN) 0.005 % ophthalmic solution Place 1 drop into the left eye every other day.   LYCOPENE PO Take 10 mg by mouth every other day.   Magnesium 250 MG TABS Take 250 mg by mouth every 3 (three) days.   metFORMIN (GLUCOPHAGE) 500 MG tablet Take 1 tablet (500 mg total) by mouth 2 (two) times daily with a meal.   metoprolol (TOPROL-XL) 50 MG 24 hr tablet Take 1 tablet (50 mg total) by mouth 2 (two) times daily. Take with or immediately following a meal.   Omega-3 Fatty Acids (FISH OIL) 1000 MG CAPS Take 1,000 mg by mouth daily with  supper.   rosuvastatin (CRESTOR) 20 MG tablet Take 1 tablet (20 mg total) by mouth daily.   Sodium Fluoride (PREVIDENT 5000 DRY MOUTH DT) Place 1 application onto teeth See admin instructions. Apply to gums every night for dry mouth   vitamin B-12 (CYANOCOBALAMIN) 500 MCG tablet Take 500 mcg by mouth every other day.   vitamin C (ASCORBIC ACID) 500 MG tablet Take 500-1,000 mg by mouth See admin instructions. Alternate taking 500 mg one day and 1000  mg the next   Vitamin D, Ergocalciferol, (DRISDOL) 1.25 MG (50000 UNIT) CAPS capsule Take 50,000 Units by mouth every Saturday.   warfarin (COUMADIN) 4 MG tablet TAKE 1/2 TO 1 TABLET DAILY AS DIRECTED BY COUMADIN CLINIC   [DISCONTINUED] amoxicillin (AMOXIL) 500 MG tablet Take 4 tablets (2000 mg) by mouth ONE HOUR before any dental procedures.     Allergies:   Other   Social History   Socioeconomic History   Marital status: Divorced    Spouse name: Not on file   Number of children: 2   Years of education: 16   Highest education level: Not on file  Occupational History   Occupation: Retired Pharmacist, hospital  Tobacco Use   Smoking status: Former    Packs/day: 0.50    Years: 50.00    Total pack years: 25.00    Types: Cigarettes    Quit date: 12/16/2020    Years since quitting: 1.1   Smokeless tobacco: Never  Vaping Use   Vaping Use: Never used  Substance and Sexual Activity   Alcohol use: No    Alcohol/week: 0.0 standard drinks of alcohol   Drug use: No   Sexual activity: Not Currently  Other Topics Concern   Not on file  Social History Narrative   Lives alone   Divorced   Right-handed   Education: College   No caffeine   Social Determinants of Health   Financial Resource Strain: Not on file  Food Insecurity: Not on file  Transportation Needs: Not on file  Physical Activity: Not on file  Stress: Not on file  Social Connections: Not on file     Family History: The patient's family history includes Colon cancer in his mother;  Hypertension in his father; Ovarian cancer in his mother; Prostate cancer in his father; Uterine cancer in his mother. There is no history of Colon polyps.  ROS:   Please see the history of present illness.    All other systems reviewed and are negative.  EKGs/Labs/Other Studies Reviewed:    The following studies were reviewed today:  TAVR OPERATIVE NOTE     Date of Procedure:                02/08/2021   Preoperative Diagnosis:      Severe Aortic Stenosis    Postoperative Diagnosis:    Same    Procedure:        Transcatheter Aortic Valve Replacement - Left Common Carotid Artery Approach             Edwards Sapien 3 Ultra THV (size 26 mm, model # 9750TFX, serial # [id number])              Co-Surgeons:                        Gaye Pollack, MD and  Lauree Chandler, MD     Anesthesiologist:                  Gennie Alma, MD   Echocardiographer:              Edmonia James, Md   Pre-operative Echo Findings: Severe aortic stenosis Severe aortic insufficiency Severe mitral regurgitation Normal left ventricular systolic function   Post-operative Echo Findings: Trivial paravalvular leak Unchanged severe mitral regurgitation Normal left ventricular systolic function   _____________    Echo 02/09/21:  Inferior basal hypokinesis . Left ventricular ejection fraction, by  estimation, is 45 to  50%. The left ventricle has mildly decreased  function. The left ventricle demonstrates regional wall motion  abnormalities (see scoring diagram/findings for  description). There is moderate left ventricular hypertrophy. Left  ventricular diastolic function could not be evaluated.   2. Right ventricular systolic function is normal. The right ventricular  size is normal. There is mildly elevated pulmonary artery systolic  pressure.   3. Left atrial size was mildly dilated.   4. Degree of MR better seen with TEE on previous day likely ischemic/  with severe MAC and restricted posterior leaflet  motion Would not appear  to be clippable . The mitral valve is degenerative. Severe mitral valve  regurgitation. Severe mitral annular  calcification.   5. Trivial ? central AR post TAVR with 26 mm Sapien 3 valve mean gradient  13 peak 24 mmHG AVA 3.6 cm2 DVI 0.93. The aortic valve has been  repaired/replaced. Aortic valve regurgitation is trivial. There is a 26 mm  Sapien prosthetic (TAVR) valve present  in the aortic position. Procedure Date: 02/08/21.   ___________________  Echo 03/10/21 IMPRESSIONS  1. Left ventricular ejection fraction, by estimation, is 50 to 55%. The  left ventricle has low normal function. The left ventricle demonstrates  regional wall motion abnormalities (see scoring diagram/findings for  description). There is mild left  ventricular hypertrophy. Left ventricular diastolic function could not be  evaluated. There is incoordinate septal motion. There is severe akinesis  of the left ventricular, entire inferoseptal wall and septal wall.   2. Right ventricular systolic function is moderately reduced. The right  ventricular size is normal. There is normal pulmonary artery systolic  pressure.   3. Left atrial size was severely dilated.   4. Right atrial size was moderately dilated.   5. The mitral valve is abnormal. Moderate mitral valve regurgitation. No  evidence of mitral stenosis. Moderate to severe mitral annular  calcification.   6. The aortic valve has been repaired/replaced. Aortic valve  regurgitation is not visualized. There is a 23 mm Sapien prosthetic (TAVR)  valve present in the aortic position. Procedure Date: 02/08/2021. Echo  findings are consistent with normal structure  and function of the aortic valve prosthesis. Aortic valve area, by VTI  measures 1.81 cm. Aortic valve mean gradient measures 7.0 mmHg. Aortic  valve Vmax measures 1.82 m/s. Peak gradient 13 mmHg, DI 0.44.   7. Aortic dilatation noted. There is borderline dilatation of the  aortic  root, measuring 38 mm.   8. The inferior vena cava is normal in size with greater than 50%  respiratory variability, suggesting right atrial pressure of 3 mmHg.   Comparison(s): 02/08/2021: LVEF 45-50%, TAVR valve with mean gradient 13 mmHg.   ____________________  Echo 01/27/22 ***  EKG:  EKG is NOT ordered today.    Recent Labs: 02/04/2021: ALT 49 02/09/2021: BUN 21; Creatinine, Ser 1.34; Magnesium 1.8; Potassium 3.9; Sodium 140 07/26/2021: Hemoglobin 13.2; Platelets 204  Recent Lipid Panel    Component Value Date/Time   CHOL 119 04/26/2020 0941   TRIG 120 04/26/2020 0941   HDL 37 (L) 04/26/2020 0941   CHOLHDL 3.2 04/26/2020 0941   CHOLHDL 5.8 (H) 03/10/2016 0830   VLDL 41 (H) 03/10/2016 0830   LDLCALC 60 04/26/2020 0941    Physical Exam:    VS:  BP 128/78   Pulse 69   Ht _0  (1.727 m)   Wt 221 lb (100.2 kg)   SpO2 97%   BMI 33.60 kg/m     Wt  Readings from Last 3 Encounters:  01/27/22 221 lb (100.2 kg)  07/26/21 219 lb (99.3 kg)  04/29/21 210 lb 6.4 oz (95.4 kg)    General: Well developed, well nourished, NAD Neck: . No JVD. Lungs:Clear to ausculation bilaterally. Breathing is unlabored Cardiovascular: Irregularly irregular. Soft murmur present  Extremities: No edema.  Neuro: Alert and oriented. No focal deficits. No facial asymmetry. MAE spontaneously. Psych: Responds to questions appropriately with normal affect.    ASSESSMENT/Plan:    Severe AS s/p TAVR: echo today shows EF 60%, normally functioning TAVR with a mean gradient of 8 mm hg and no PVL. He has NYHA class I symptoms. SBE prophylaxis reviewed; Amoxicillin 2g one hour prior to dental procedures. Continue on long term Coumadin. Continue regular follow up with Dr. Sallyanne Kuster.    Chronic atrial fibrillation: HR 85 bpm today. Continue Toprol XL and Coumadin.   CAD s/p CABG: pre TAVR cath showed 3/3 patent bypass grafts and severe native artery disease. Continue Toprol and statin. No aspirin with  chronic coumadin. No chest pain    Chronic diastolic CHF: has been off diuretics since TAVR.   HTN: BP well controlled today. No changes made.   Tobacco abuse: has been quit for 13 months. Congratulated him on big achievement.  Medication Adjustments/Labs and Tests Ordered: Current medicines are reviewed at length with the patient today.  Concerns regarding medicines are outlined above.  No orders of the defined types were placed in this encounter.   Meds ordered this encounter  Medications   amoxicillin (AMOXIL) 500 MG tablet    Sig: Take 4 tablets (2000 mg) by mouth ONE HOUR before any dental procedures.    Dispense:  12 tablet    Refill:  4    Order Specific Question:   Supervising Provider    Answer:   Sherren Mocha [[id number]]     Patient Instructions  Medication Instructions:  Your physician recommends that you continue on your current medications as directed. Please refer to the Current Medication list given to you today.  *If you need a refill on your cardiac medications before your next appointment, please call your pharmacy*   Lab Work: NONE If you have labs (blood work) drawn today and your tests are completely normal, you will receive your results only by: [place] (if you have MyChart) OR A paper copy in the mail If you have any lab test that is abnormal or we need to change your treatment, we will call you to review the results.   Testing/Procedures: NONE   Follow-Up: At [facility], you and your health needs are our priority.  As part of our continuing mission to provide you with exceptional heart care, we have created designated Provider Care Teams.  These Care Teams include your primary Cardiologist (physician) and Advanced Practice Providers (APPs -  Physician Assistants and Nurse Practitioners) who all work together to provide you with the care you need, when you need it.  We recommend signing up for the patient portal called "MyChart".   Sign up information is provided on this After Visit Summary.  MyChart is used to connect with patients for Virtual Visits (Telemedicine).  Patients are able to view lab/test results, encounter notes, upcoming appointments, etc.  Non-urgent messages can be sent to your provider as well.   To learn more about what you can do with MyChart, go to [email or web address].    Your next appointment:   KEEP SCHEDULED FOLLOW-UP  Important Information About Sugar  Signed, Angelena Form, PA-C  01/27/2022 12:58 PM    [facility]  Seen with Kathyrn Drown who wrote note and agree with findings.   Angelena Form PA-C  [facility]

## 2022-01-27 ENCOUNTER — Ambulatory Visit (INDEPENDENT_AMBULATORY_CARE_PROVIDER_SITE_OTHER): Payer: Medicare Other | Admitting: Physician Assistant

## 2022-01-27 ENCOUNTER — Ambulatory Visit (HOSPITAL_COMMUNITY): Payer: Medicare Other | Attending: Physician Assistant

## 2022-01-27 VITALS — BP 128/78 | HR 69 | Ht 68.0 in | Wt 221.0 lb

## 2022-01-27 DIAGNOSIS — I482 Chronic atrial fibrillation, unspecified: Secondary | ICD-10-CM | POA: Insufficient documentation

## 2022-01-27 DIAGNOSIS — I5032 Chronic diastolic (congestive) heart failure: Secondary | ICD-10-CM

## 2022-01-27 DIAGNOSIS — Z952 Presence of prosthetic heart valve: Secondary | ICD-10-CM | POA: Insufficient documentation

## 2022-01-27 DIAGNOSIS — I10 Essential (primary) hypertension: Secondary | ICD-10-CM | POA: Insufficient documentation

## 2022-01-27 DIAGNOSIS — I2510 Atherosclerotic heart disease of native coronary artery without angina pectoris: Secondary | ICD-10-CM | POA: Diagnosis present

## 2022-01-27 LAB — ECHOCARDIOGRAM COMPLETE
AV Mean grad: 7.3 mmHg
AV Peak grad: 15.2 mmHg
Ao pk vel: 1.95 m/s
MV M vel: 5.03 m/s
MV Peak grad: 101.2 mmHg
S' Lateral: 2.3 cm

## 2022-01-27 MED ORDER — AMOXICILLIN 500 MG PO TABS: ORAL_TABLET | ORAL | 4 refills | Status: DC

## 2022-01-27 NOTE — Patient Instructions (Signed)
Medication Instructions:  Your physician recommends that you continue on your current medications as directed. Please refer to the Current Medication list given to you today.  *If you need a refill on your cardiac medications before your next appointment, please call your pharmacy*   Lab Work: NONE If you have labs (blood work) drawn today and your tests are completely normal, you will receive your results only by: MyChart Message (if you have MyChart) OR A paper copy in the mail If you have any lab test that is abnormal or we need to change your treatment, we will call you to review the results.   Testing/Procedures: NONE   Follow-Up: At [facility], you and your health needs are our priority.  As part of our continuing mission to provide you with exceptional heart care, we have created designated Provider Care Teams.  These Care Teams include your primary Cardiologist (physician) and Advanced Practice Providers (APPs -  Physician Assistants and Nurse Practitioners) who all work together to provide you with the care you need, when you need it.  We recommend signing up for the patient portal called "MyChart".  Sign up information is provided on this After Visit Summary.  MyChart is used to connect with patients for Virtual Visits (Telemedicine).  Patients are able to view lab/test results, encounter notes, upcoming appointments, etc.  Non-urgent messages can be sent to your provider as well.   To learn more about what you can do with MyChart, go to [email or web address].    Your next appointment:   KEEP SCHEDULED FOLLOW-UP   Important Information About Sugar       

## 2022-01-31 ENCOUNTER — Ambulatory Visit (HOSPITAL_COMMUNITY): Payer: Medicare Other | Admitting: Licensed Clinical Social Worker

## 2022-01-31 ENCOUNTER — Encounter (HOSPITAL_COMMUNITY): Payer: Self-pay

## 2022-02-14 ENCOUNTER — Ambulatory Visit (INDEPENDENT_AMBULATORY_CARE_PROVIDER_SITE_OTHER): Payer: Medicare Other | Admitting: Licensed Clinical Social Worker

## 2022-02-14 DIAGNOSIS — F339 Major depressive disorder, recurrent, unspecified: Secondary | ICD-10-CM

## 2022-02-14 DIAGNOSIS — F411 Generalized anxiety disorder: Secondary | ICD-10-CM | POA: Diagnosis not present

## 2022-02-14 NOTE — Progress Notes (Unsigned)
   THERAPIST PROGRESS NOTE  Session Time: 8:00am-9:00am  Participation Level: Active  Behavioral Response: Well GroomedAlertDepressed and Irritable  Type of Therapy: Individual Therapy  Treatment Goals addressed: "to cope with stress and manage depression and anxiety". Clarence will report improved stress management and coping skills, resulting in mood stability and less worrying 4 out of 7 days.  ProgressTowards Goals: Progressing  Interventions: CBT  Summary: Devaun Hernandez is a 76 y.o. male who presents with MDD, recurrent, chronic.   Suicidal/Homicidal: Nowithout intent/plan  Therapist Response: Tranquilino engaged well in session. 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 Clinician identified the importance of ease for himself and continuity for his providers. Clinician explored stress levels and noted some improvement with increased socialization and activities. Sarath shared updates on his relationships with his children, grandchildren, and extended supports through the [place] program. Clinician also processed coping skills to assist with loneliness or anxiety regarding daily stressors.   Plan: Return again in 2 weeks.  Diagnosis: Major depression, recurrent, chronic (HCC)  GAD (generalized anxiety disorder)  Collaboration of Care: Psychiatrist AEB provided updates to Dr. Casimiro Needle  Patient/Guardian was advised Release of Information must be obtained prior to any record release in order to collaborate their care with an outside provider. Patient/Guardian was advised if they have not already done so to contact the registration department to sign all necessary forms in order for [place] to release information regarding their care.   Consent: Patient/Guardian gives verbal consent for treatment and assignment of benefits for services provided during this visit. Patient/Guardian expressed understanding and agreed to proceed.   Jobe Marker  [place], LCSW 02/14/2022

## 2022-02-16 ENCOUNTER — Encounter (HOSPITAL_COMMUNITY): Payer: Self-pay | Admitting: Licensed Clinical Social Worker

## 2022-02-18 ENCOUNTER — Other Ambulatory Visit: Payer: Self-pay | Admitting: Cardiovascular Disease

## 2022-02-23 ENCOUNTER — Encounter: Payer: Self-pay | Admitting: Nurse Practitioner

## 2022-02-23 ENCOUNTER — Ambulatory Visit (INDEPENDENT_AMBULATORY_CARE_PROVIDER_SITE_OTHER): Payer: Medicare Other | Admitting: Nurse Practitioner

## 2022-02-23 VITALS — BP 160/88 | HR 69 | Temp 97.2°F | Ht 68.0 in | Wt 223.0 lb

## 2022-02-23 DIAGNOSIS — E1169 Type 2 diabetes mellitus with other specified complication: Secondary | ICD-10-CM

## 2022-02-23 DIAGNOSIS — Z0000 Encounter for general adult medical examination without abnormal findings: Secondary | ICD-10-CM | POA: Diagnosis not present

## 2022-02-23 DIAGNOSIS — E559 Vitamin D deficiency, unspecified: Secondary | ICD-10-CM

## 2022-02-23 DIAGNOSIS — E119 Type 2 diabetes mellitus without complications: Secondary | ICD-10-CM | POA: Diagnosis not present

## 2022-02-23 DIAGNOSIS — E669 Obesity, unspecified: Secondary | ICD-10-CM

## 2022-02-23 LAB — POCT GLYCOSYLATED HEMOGLOBIN (HGB A1C)
HbA1c POC (<> result, manual entry): 7.3 % (ref 4.0–5.6)
HbA1c, POC (controlled diabetic range): 7.3 % — AB (ref 0.0–7.0)
HbA1c, POC (prediabetic range): 7.3 % — AB (ref 5.7–6.4)
Hemoglobin A1C: 7.3 % — AB (ref 4.0–5.6)

## 2022-02-23 LAB — GLUCOSE, POCT (MANUAL RESULT ENTRY): POC Glucose: 160 mg/dl — AB (ref 70–99)

## 2022-02-23 MED ORDER — VITAMIN D (ERGOCALCIFEROL) 1.25 MG (50000 UNIT) PO CAPS: 50000.0000 [IU] | ORAL_CAPSULE | ORAL | 2 refills | Status: DC

## 2022-02-23 MED ORDER — METFORMIN HCL 500 MG PO TABS: 500.0000 mg | ORAL_TABLET | Freq: Two times a day (BID) | ORAL | 2 refills | Status: DC

## 2022-02-23 NOTE — Progress Notes (Signed)
$'@Patient'D$  ID: Rodney Friedman, male    DOB: [birth year], 76 y.o.   MRN: [id number]  Chief Complaint  Patient presents with   Establish Care    Pt is here to establish care. Pt is requesting labs done today     Referring provider: Kristie Cowman, MD   HPI  Patient is here to establish care.  Patient is currently a patient with [facility].  We will request records.  He currently has a cardiologist for A-fib.  He also sees [facility] and has a behavioral health specialist that he sees.  He states that has had prolia ordered for osteoporosis. Waiting to start due to dental issues.  With new diagnosis of osteoporosis patient may need to see an oral surgeon going forward. 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 Extract Rash    Immunization History  Administered Date(s) Administered   Fluad Quad(high Dose 65+) 01/22/2019   Influenza Split 02/17/2015   Influenza, High Dose Seasonal PF 02/15/2018, 01/22/2020, 01/23/2022   Influenza, Quadrivalent, Recombinant, Inj, Pf 01/22/2019   Influenza,inj,Quad PF,6+ Mos 02/11/2016, 02/08/2017   Influenza-Unspecified 02/17/2015, 01/22/2019   Moderna Sars-Covid-2 Vaccination 08/20/2019, 09/17/2019, 03/25/2020   Pneumococcal Conjugate-13 02/24/2014   Pneumococcal Polysaccharide-23 09/08/2015   Tdap 09/08/2015   Unspecified SARS-COV-2 Vaccination 08/20/2019   Zoster, Live 05/22/2013    Past Medical History:  Diagnosis Date   Adenomatous colon polyp    Alcoholism ([place])    Anxiety    Atrial fibrillation ([place])    CAD (coronary artery disease)    Cataract    removed left eye    CHF (congestive heart failure) ([place]) 12/16/2020   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 MAngelena Form& Dr. 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   amoxicillin (AMOXIL) 500 MG  tablet Take 4 tablets (2000 mg) by mouth ONE HOUR before any dental procedures.   B Complex Vitamins (VITAMIN-B COMPLEX PO) Take 1 tablet by mouth daily with supper.   BIOTIN PO Take 1 tablet by mouth every evening.   buPROPion (WELLBUTRIN SR) 200 MG 12 hr tablet Take 200 mg by mouth every morning.   buPROPion ER (WELLBUTRIN SR) 100 MG 12 hr tablet 2  qam   1  q diner   Calcium Carbonate-Vit D-Min (CALCIUM 1200 PO) Take 1,200 mg by mouth every evening.   calcium elemental as carbonate (TUMS ULTRA 1000) 400 MG chewable tablet Chew 1,000 mg by mouth daily as needed for heartburn.   docusate sodium (COLACE) 100 MG capsule Take 100  mg by mouth every evening.   ezetimibe (ZETIA) 10 MG tablet TAKE 1 TABLET BY MOUTH EVERY DAY   gabapentin (NEURONTIN) 100 MG capsule 3  qhs   ketoconazole (NIZORAL) 2 % shampoo Apply 1 application topically 2 (two) times a week.   latanoprost (XALATAN) 0.005 % ophthalmic solution Place 1 drop into the left eye every other day.   LYCOPENE PO Take 10 mg by mouth every other day.   Magnesium 250 MG TABS Take 250 mg by mouth every 3 (three) days.   metoprolol (TOPROL-XL) 50 MG 24 hr tablet Take 1 tablet (50 mg total) by mouth 2 (two) times daily. Take with or immediately following a meal.   Omega-3 Fatty Acids (FISH OIL) 1000 MG CAPS Take 1,000 mg by mouth daily with supper.   rosuvastatin (CRESTOR) 20 MG tablet Take 1 tablet (20 mg total) by mouth daily.   Sodium Fluoride (PREVIDENT 5000 DRY MOUTH DT) Place 1 application onto teeth See admin instructions. Apply to gums every night for dry mouth   vitamin B-12 (CYANOCOBALAMIN) 500 MCG tablet Take 500 mcg by mouth every other day.   vitamin C (ASCORBIC ACID) 500 MG tablet Take 500-1,000 mg by mouth See admin instructions. Alternate taking 500 mg one day and 1000 mg the next   warfarin (COUMADIN) 4 MG tablet TAKE 1/2 TO 1 TABLET DAILY AS DIRECTED BY COUMADIN CLINIC   [DISCONTINUED] metFORMIN (GLUCOPHAGE) 500 MG tablet Take 1 tablet (500 mg total) by mouth 2 (two) times daily with a meal.   [DISCONTINUED] Vitamin D, Ergocalciferol, (DRISDOL) 1.25 MG (50000 UNIT) CAPS capsule Take 50,000 Units by mouth every Saturday.   furosemide (LASIX) 20 MG tablet Take 1 tablet (20 mg total) by mouth daily as needed (weight gain of 3 pounds or more overnight or 5 pounds in a week). (Patient not taking: Reported on 01/27/2022)   metFORMIN (GLUCOPHAGE) 500 MG tablet Take 1 tablet (500 mg total) by mouth 2 (two) times daily with a meal.   [START ON 02/25/2022] Vitamin D, Ergocalciferol, (DRISDOL) 1.25 MG (50000 UNIT) CAPS capsule Take 1 capsule (50,000 Units total) by mouth  every Saturday.   No facility-administered encounter medications on file as of 02/23/2022.     Review of Systems  Review of Systems  Constitutional: Negative.   HENT: Negative.    Cardiovascular: Negative.   Gastrointestinal: Negative.   Allergic/Immunologic: Negative.   Neurological: Negative.   Psychiatric/Behavioral: Negative.         Physical Exam  BP (!) 160/88 (BP Location: Right Arm, Patient Position: Sitting, Cuff Size: Large)   Pulse 69   Temp (!) 97.2 F (36.2 C)   Ht '5\' 8"'$  (1.727 m)   Wt 223 lb (101.2 kg)   SpO2 100%  BMI 33.91 kg/m   Wt Readings from Last 5 Encounters:  02/23/22 223 lb (101.2 kg)  01/27/22 221 lb (100.2 kg)  07/26/21 219 lb (99.3 kg)  04/29/21 210 lb 6.4 oz (95.4 kg)  04/12/21 207 lb (93.9 kg)     Physical Exam Vitals and nursing note reviewed.  Constitutional:      General: He is not in acute distress.    Appearance: He is well-developed.  Cardiovascular:     Rate and Rhythm: Normal rate and regular rhythm.  Pulmonary:     Effort: Pulmonary effort is normal.     Breath sounds: Normal breath sounds.  Skin:    General: Skin is warm and dry.  Neurological:     Mental Status: He is alert and oriented to person, place, and time.      Lab Results:  CBC    Component Value Date/Time   WBC 13.1 (H) 07/26/2021 0802   RBC 4.42 07/26/2021 0802   HGB 13.2 07/26/2021 0802   HGB 12.9 (L) 01/14/2021 0856   HGB 15.8 01/01/2017 0752   HCT 39.4 07/26/2021 0802   HCT 39.0 01/14/2021 0856   HCT 46.4 01/01/2017 0752   PLT 204 07/26/2021 0802   PLT 208 01/14/2021 0856   MCV 89.1 07/26/2021 0802   MCV 91 01/14/2021 0856   MCV 91.8 01/01/2017 0752   MCH 29.9 07/26/2021 0802   MCHC 33.5 07/26/2021 0802   RDW 13.9 07/26/2021 0802   RDW 14.6 01/14/2021 0856   RDW 13.5 01/01/2017 0752   LYMPHSABS 6.3 (H) 07/26/2021 0802   LYMPHSABS 9.5 (H) 04/30/2019 1103   LYMPHSABS 9.7 (H) 01/01/2017 0752   MONOABS 0.9 07/26/2021 0802   MONOABS  1.5 (H) 01/01/2017 0752   EOSABS 0.2 07/26/2021 0802   EOSABS 0.2 04/30/2019 1103   BASOSABS 0.1 07/26/2021 0802   BASOSABS 0.1 04/30/2019 1103   BASOSABS 0.1 01/01/2017 0752    BMET    Component Value Date/Time   NA 140 02/09/2021 0116   NA 141 01/14/2021 0856   NA 141 01/04/2015 0808   K 3.9 02/09/2021 0116   K 4.3 01/04/2015 0808   CL 109 02/09/2021 0116   CO2 21 (L) 02/09/2021 0116   CO2 21 (L) 01/04/2015 0808   GLUCOSE 102 (H) 02/09/2021 0116   GLUCOSE 115 01/04/2015 0808   BUN 21 02/09/2021 0116   BUN 22 01/14/2021 0856   BUN 18.7 01/04/2015 0808   CREATININE 1.34 (H) 02/09/2021 0116   CREATININE 1.16 03/10/2016 0830   CREATININE 0.9 01/04/2015 0808   CALCIUM 8.8 (L) 02/09/2021 0116   CALCIUM 9.0 01/04/2015 0808   GFRNONAA 56 (L) 02/09/2021 0116   GFRNONAA 70 02/11/2016 1526   GFRAA 79 04/26/2020 0941   GFRAA 81 02/11/2016 1526    BNP    Component Value Date/Time   BNP 672.5 (H) 01/24/2021 0409    ProBNP No results found for: "PROBNP"  Imaging: ECHOCARDIOGRAM COMPLETE  Result Date: 01/27/2022    ECHOCARDIOGRAM REPORT   Patient Name:   Eldar Robitaille Date of Exam: 01/27/2022 Medical Rec #:  [id number]       Height:       68.0 in Accession #:    [id number]      Weight:       219.0 lb Date of Birth:  1945/11/02       BSA:          2.124 m Patient Age:    84 years  BP:           136/79 mmHg Patient Gender: M               HR:           76 bpm. Exam Location:  [place] Procedure: 2D Echo, Cardiac Doppler and Color Doppler Indications:     Z95.2 Post TAVR evaluation  History:         Patient has prior history of Echocardiogram examinations, most                  recent 03/10/2021. CAD, Arrythmias:Atrial Fibrillation; Risk                  Factors:Hypertension, Dyslipidemia, Diabetes and Sleep Apnea.                  Aortic stenosis. Chronic pain.  Sonographer:     Diamond Nickel RCS Referring Phys:  Eileen Stanford Diagnosing Phys: Skeet Latch MD   Sonographer Comments: Techmically difficult study. IMPRESSIONS  1. Severe hypertrophy of the basal septum with otherwise mild concentric LVH. Inferior and inferoseptal hypokinesis. Left ventricular ejection fraction, by estimation, is 50 to 55%. The left ventricle has low normal function. The left ventricle demonstrates regional wall motion abnormalities (see scoring diagram/findings for description). There is severe left ventricular hypertrophy of the basal-septal segment. Left ventricular diastolic function could not be evaluated.  2. Right ventricular systolic function is normal. The right ventricular size is normal. There is normal pulmonary artery systolic pressure.  3. Left atrial size was severely dilated.  4. The mitral valve is degenerative. Mild mitral valve regurgitation. No evidence of mitral stenosis. Moderate mitral annular calcification.  5. TAVR gradients unchanged from 02/2021. The aortic valve has been repaired/replaced. Aortic valve regurgitation is not visualized. No aortic stenosis is present. Echo findings are consistent with normal structure and function of the aortic valve prosthesis. Aortic valve mean gradient measures 7.3 mmHg. Aortic valve Vmax measures 1.95 m/s.  6. The inferior vena cava is normal in size with greater than 50% respiratory variability, suggesting right atrial pressure of 3 mmHg. FINDINGS  Left Ventricle: Severe hypertrophy of the basal septum with otherwise mild concentric LVH. Inferior and inferoseptal hypokinesis. Left ventricular ejection fraction, by estimation, is 50 to 55%. The left ventricle has low normal function. The left ventricle demonstrates regional wall motion abnormalities. The left ventricular internal cavity size was normal in size. There is severe left ventricular hypertrophy of the basal-septal segment. Left ventricular diastolic function could not be evaluated due to atrial fibrillation. Left ventricular diastolic function could not be evaluated.  Right Ventricle: The right ventricular size is normal. No increase in right ventricular wall thickness. Right ventricular systolic function is normal. There is normal pulmonary artery systolic pressure. The tricuspid regurgitant velocity is 2.09 m/s, and  with an assumed right atrial pressure of 3 mmHg, the estimated right ventricular systolic pressure is 58.5 mmHg. Left Atrium: Left atrial size was severely dilated. Right Atrium: Right atrial size was normal in size. Pericardium: There is no evidence of pericardial effusion. Mitral Valve: The mitral valve is degenerative in appearance. Moderate mitral annular calcification. Mild mitral valve regurgitation. No evidence of mitral valve stenosis. Tricuspid Valve: The tricuspid valve is normal in structure. Tricuspid valve regurgitation is trivial. No evidence of tricuspid stenosis. Aortic Valve: TAVR gradients unchanged from 02/2021. The aortic valve has been repaired/replaced. Aortic valve regurgitation is not visualized. No aortic stenosis is present. Aortic valve mean  gradient measures 7.3 mmHg. Aortic valve peak gradient measures 15.2 mmHg. There is a 26 mm Sapien prosthetic, stented (TAVR) valve present in the aortic position. Echo findings are consistent with normal structure and function of the aortic valve prosthesis. Pulmonic Valve: The pulmonic valve was normal in structure. Pulmonic valve regurgitation is not visualized. No evidence of pulmonic stenosis. Aorta: The aortic root is normal in size and structure. Venous: The inferior vena cava is normal in size with greater than 50% respiratory variability, suggesting right atrial pressure of 3 mmHg. IAS/Shunts: No atrial level shunt detected by color flow Doppler.  LEFT VENTRICLE PLAX 2D LVIDd:         4.00 cm LVIDs:         2.30 cm LV PW:         1.20 cm LV IVS:        1.70 cm  RIGHT VENTRICLE RV Basal diam:  2.60 cm TAPSE (M-mode): 0.8 cm RVSP:           20.5 mmHg LEFT ATRIUM           Index        RIGHT  ATRIUM           Index LA diam:      6.40 cm 3.01 cm/m   RA Pressure: 3.00 mmHg LA Vol (A2C): 83.3 ml 39.22 ml/m  RA Area:     21.40 cm LA Vol (A4C): 85.5 ml 40.25 ml/m  RA Volume:   50.00 ml  23.54 ml/m  AORTIC VALVE AV Vmax:           195.00 cm/s AV Vmean:          126.000 cm/s AV VTI:            0.315 m AV Peak Grad:      15.2 mmHg AV Mean Grad:      7.3 mmHg LVOT Vmax:         114.67 cm/s LVOT Vmean:        78.233 cm/s LVOT VTI:          0.221 m LVOT/AV VTI ratio: 0.70  AORTA Ao Asc diam: 3.60 cm MR Peak grad: 101.2 mmHg  TRICUSPID VALVE MR Mean grad: 63.0 mmHg   TR Peak grad:   17.5 mmHg MR Vmax:      503.00 cm/s TR Vmax:        209.00 cm/s MR Vmean:     370.3 cm/s  Estimated RAP:  3.00 mmHg                           RVSP:           20.5 mmHg                            SHUNTS                           Systemic VTI: 0.22 m Skeet Latch MD Electronically signed by Skeet Latch MD Signature Date/Time: 01/27/2022/4:03:40 PM    Final (Updated)      Assessment & Plan:   Health care maintenance 2. Diabetes mellitus type 2 in obese (HCC)  - POCT glycosylated hemoglobin (Hb A1C) - POCT glucose (manual entry)  3. Type 2 diabetes mellitus without complication, without long-term current use of insulin (HCC)  - metFORMIN (GLUCOPHAGE) 500 MG tablet;  Take 1 tablet (500 mg total) by mouth 2 (two) times daily with a meal.  Dispense: 60 tablet; Refill: 2  4. Vitamin D deficiency  - Vitamin D, Ergocalciferol, (DRISDOL) 1.25 MG (50000 UNIT) CAPS capsule; Take 1 capsule (50,000 Units total) by mouth every Saturday.  Dispense: 4 capsule; Refill: 2    Follow up:  Follow up in 3 months or sooner if needed     Fenton Foy, NP 02/23/2022

## 2022-02-23 NOTE — Patient Instructions (Signed)
1. Health care maintenance   2. Diabetes mellitus type 2 in obese (HCC)  - POCT glycosylated hemoglobin (Hb A1C) - POCT glucose (manual entry)  3. Type 2 diabetes mellitus without complication, without long-term current use of insulin (HCC)  - metFORMIN (GLUCOPHAGE) 500 MG tablet; Take 1 tablet (500 mg total) by mouth 2 (two) times daily with a meal.  Dispense: 60 tablet; Refill: 2  4. Vitamin D deficiency  - Vitamin D, Ergocalciferol, (DRISDOL) 1.25 MG (50000 UNIT) CAPS capsule; Take 1 capsule (50,000 Units total) by mouth every Saturday.  Dispense: 4 capsule; Refill: 2    Follow up:  Follow up in 3 months or sooner if needed

## 2022-02-23 NOTE — Assessment & Plan Note (Signed)
2. Diabetes mellitus type 2 in obese (HCC)  - POCT glycosylated hemoglobin (Hb A1C) - POCT glucose (manual entry)  3. Type 2 diabetes mellitus without complication, without long-term current use of insulin (HCC)  - metFORMIN (GLUCOPHAGE) 500 MG tablet; Take 1 tablet (500 mg total) by mouth 2 (two) times daily with a meal.  Dispense: 60 tablet; Refill: 2  4. Vitamin D deficiency  - Vitamin D, Ergocalciferol, (DRISDOL) 1.25 MG (50000 UNIT) CAPS capsule; Take 1 capsule (50,000 Units total) by mouth every Saturday.  Dispense: 4 capsule; Refill: 2    Follow up:  Follow up in 3 months or sooner if needed

## 2022-02-24 ENCOUNTER — Ambulatory Visit: Payer: Medicare Other | Attending: Cardiovascular Disease

## 2022-02-24 DIAGNOSIS — I482 Chronic atrial fibrillation, unspecified: Secondary | ICD-10-CM

## 2022-02-24 DIAGNOSIS — Z7901 Long term (current) use of anticoagulants: Secondary | ICD-10-CM

## 2022-02-24 DIAGNOSIS — Z5181 Encounter for therapeutic drug level monitoring: Secondary | ICD-10-CM | POA: Diagnosis not present

## 2022-02-24 LAB — CBC
Hematocrit: 46.5 % (ref 37.5–51.0)
Hemoglobin: 15.4 g/dL (ref 13.0–17.7)
MCH: 30.5 pg (ref 26.6–33.0)
MCHC: 33.1 g/dL (ref 31.5–35.7)
MCV: 92 fL (ref 79–97)
Platelets: 207 10*3/uL (ref 150–450)
RBC: 5.05 x10E6/uL (ref 4.14–5.80)
RDW: 13.9 % (ref 11.6–15.4)
WBC: 14.6 10*3/uL — ABNORMAL HIGH (ref 3.4–10.8)

## 2022-02-24 LAB — POCT INR: INR: 1.6 — AB (ref 2.0–3.0)

## 2022-02-24 NOTE — Patient Instructions (Signed)
TAKE 1 TABLET TODAY ONLY and then  continue taking 1 tablet daily except for 1/2 tablet on Sunday, Tuesday and Friday. Recheck INR in 4 weeks, patient aware of risks. [facility] [phone]

## 2022-02-27 ENCOUNTER — Encounter: Payer: Self-pay | Admitting: Hematology and Oncology

## 2022-02-27 ENCOUNTER — Encounter: Payer: Self-pay | Admitting: Pulmonary Disease

## 2022-02-27 ENCOUNTER — Encounter: Payer: Self-pay | Admitting: Cardiovascular Disease

## 2022-02-28 ENCOUNTER — Ambulatory Visit (INDEPENDENT_AMBULATORY_CARE_PROVIDER_SITE_OTHER): Payer: Medicare Other | Admitting: Licensed Clinical Social Worker

## 2022-02-28 ENCOUNTER — Encounter: Payer: Self-pay | Admitting: Hematology and Oncology

## 2022-02-28 DIAGNOSIS — F339 Major depressive disorder, recurrent, unspecified: Secondary | ICD-10-CM | POA: Diagnosis not present

## 2022-02-28 DIAGNOSIS — F411 Generalized anxiety disorder: Secondary | ICD-10-CM | POA: Diagnosis not present

## 2022-03-01 ENCOUNTER — Encounter (HOSPITAL_COMMUNITY): Payer: Self-pay | Admitting: Licensed Clinical Social Worker

## 2022-03-01 NOTE — Progress Notes (Signed)
   THERAPIST PROGRESS NOTE  Session Time: 9:00am-10:00am  Participation Level: Active  Behavioral Response: Well GroomedAlertAnxious and Irritable  Type of Therapy: Individual Therapy  Treatment Goals addressed: "to cope with stress and manage depression and anxiety". Brysyn will report improved stress management and coping skills, resulting in mood stability and less worrying 4 out of 7 days.  ProgressTowards Goals: Progressing  Interventions: CBT  Summary: Pressley Tadesse is a 77 y.o. male who presents with MDD, recurrent, chronic and GAD.   Suicidal/Homicidal: Nowithout intent/plan  Therapist Response: Kylee engaged well in individual session with Pension scheme manager. Clinician utilized CBT to process updates in thoughts, feelings, and behaviors. Clinician discussed decision to transfer care to a [facility] PCP, following 2 years of struggling to be the courier of information between his [facility] docs and his [facility] PCP. Tetsuo shared that he is feelings confident in his decision and things will be easier now. Clinician validated this decision and noted the positive outcomes of going back and forth between [place] and [facility]. Clinician explored recent issues with anxiety, particularly regarding getting lost while driving and not having up to date technology. Clinician explored possibilities of getting enhanced phone/GPS. Faith shared that he has tried and this is not going to work for him. Clinician processed thoughts and feelings of frustration and fear. Clinician processed coping skills, deep breathing, taking his time without getting over-stressed, positive self talk.   Plan: Return again in 2 weeks.  Diagnosis: Major depression, recurrent, chronic (HCC)  GAD (generalized anxiety disorder)  Collaboration of Care: Psychiatrist AEB updated Dr. Casimiro Needle  Patient/Guardian was advised Release of Information must be obtained prior to any record release in order to collaborate their care with an outside provider.  Patient/Guardian was advised if they have not already done so to contact the registration department to sign all necessary forms in order for [place] to release information regarding their care.   Consent: Patient/Guardian gives verbal consent for treatment and assignment of benefits for services provided during this visit. Patient/Guardian expressed understanding and agreed to proceed.   Jobe Marker [place], LCSW 03/01/2022

## 2022-03-07 ENCOUNTER — Ambulatory Visit (HOSPITAL_BASED_OUTPATIENT_CLINIC_OR_DEPARTMENT_OTHER): Payer: Medicare Other | Admitting: Psychiatry

## 2022-03-07 DIAGNOSIS — I2510 Atherosclerotic heart disease of native coronary artery without angina pectoris: Secondary | ICD-10-CM

## 2022-03-07 DIAGNOSIS — F325 Major depressive disorder, single episode, in full remission: Secondary | ICD-10-CM

## 2022-03-07 MED ORDER — BUPROPION HCL ER (SR) 200 MG PO TB12: 200.0000 mg | ORAL_TABLET | Freq: Every morning | ORAL | 2 refills | Status: DC

## 2022-03-07 MED ORDER — GABAPENTIN 100 MG PO CAPS: ORAL_CAPSULE | ORAL | 1 refills | Status: DC

## 2022-03-07 MED ORDER — BUPROPION HCL ER (SR) 100 MG PO TB12: ORAL_TABLET | ORAL | 4 refills | Status: DC

## 2022-03-07 NOTE — Progress Notes (Signed)
Psychiatric Initial Adult Assessment   Patient Identification: Rodney Friedman MRN:  [id number] Date of Evaluation:  03/07/2022 Referral Source: Dr. Nyoka Cowden Chief Complaint: Need follow-up care   Visit Diagnosis: Bipolar disorder, substance use disorder Bipolar disorder  History of Present Illness:  Today the patient is actually doing fairly well.  His biggest issue is a physical problem with his back.  He has seen a number of surgeons and pain clinics and his opinion is he does not want to take any opiates.  The patient instead has been doing some back exercises which she says actually is helpful.  The patient continues going to NA and AA.Marland Kitchen  He continues in one-to-one therapy.  He denies daily depression.  He is enjoying himself.  He is going to power wash his driveway.  He is financially fairly stable.  He does have significant health problems.  He has extensive coronary artery disease and has had multiple interventions.  The patient continues to read and watch TV and do crosswords.  We finally got his Wellbutrin dosage straightened out.  He takes 200 mg in the morning and 100 mg at night.  The patient continues taking gabapentin as well.  The patient actually is fairly stable at this time.  He likes where he lives.  He still send some money to [place] to support some family members but other than this he seems to be financially fairly stable.  Substance Abuse History in the last 12 months:  Yes.    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 MAngelena Form& Dr. BCyndia Bent  Sleep apnea    [place]    Tremor, essential 09/22/2015    Past Surgical History:  Procedure Laterality Date   Arm Surgery Right    from door accident with glass   CARDIAC CATHETERIZATION     CATARACT EXTRACTION W/ INTRAOCULAR LENS IMPLANT Bilateral    COLONOSCOPY     CORONARY ARTERY BYPASS GRAFT  08/04/2004   EYE SURGERY     POLYPECTOMY     RIGHT/LEFT HEART CATH AND CORONARY/GRAFT ANGIOGRAPHY N/A 01/20/2021   Procedure: RIGHT/LEFT HEART CATH AND CORONARY/GRAFT ANGIOGRAPHY;  Surgeon: MBurnell Blanks MD;  [facility];  Service: Cardiovascular;  Laterality: N/A;   TEE WITHOUT CARDIOVERSION N/A 02/08/2021   Procedure: TRANSESOPHAGEAL ECHOCARDIOGRAM (TEE);  Surgeon: Burnell Blanks, MD;  Location: [facility];  Service: Open Heart Surgery;  Laterality: N/A;   TRANSCATHETER AORTIC VALVE REPLACEMENT, CAROTID Left 02/08/2021   Procedure: TRANSCATHETER AORTIC VALVE REPLACEMENT, LEFT CAROTID;  Surgeon: Burnell Blanks, MD;  Location: [facility];  Service: Open Heart Surgery;  Laterality: Left;    Family Psychiatric History:   Family History:  Family History  Problem Relation Age of Onset   Colon cancer Mother    Ovarian cancer Mother    Uterine cancer Mother    Hypertension  Father    Prostate cancer Father    Colon polyps Neg Hx     Social History:   Social History   Socioeconomic History   Marital status: Divorced    Spouse name: Not on file   Number of children: 2   Years of education: 16   Highest education level: Not on file  Occupational History   Occupation: Retired Pharmacist, hospital  Tobacco Use   Smoking status: Former    Packs/day: 0.50    Years: 50.00    Total pack years: 25.00    Types: Cigarettes    Quit date: 12/16/2020    Years since quitting: 1.2   Smokeless tobacco: Never  Vaping Use   Vaping Use: Never used  Substance and Sexual Activity   Alcohol use: No    Alcohol/week: 0.0 standard drinks of alcohol   Drug use: No   Sexual activity: Not Currently  Other Topics Concern   Not on file  Social History Narrative   Lives alone   Divorced   Right-handed   Education: College   No caffeine   Social Determinants of Health   Financial Resource Strain: Not on file  Food Insecurity: Not on file  Transportation Needs: Not on file  Physical Activity: Not on file  Stress: Not on file  Social Connections: Not on file    Additional Social History:   Allergies:   Allergies  Allergen Reactions   Other Rash    Allergen: "Plants and bushes while doing yard workDatabase administrator Extract Rash    Metabolic Disorder Labs: Lab Results  Component Value Date   HGBA1C 7.3 (A) 02/23/2022   HGBA1C 7.3 02/23/2022   HGBA1C 7.3 (A) 02/23/2022   HGBA1C 7.3 (A) 02/23/2022   MPG 128 12/17/2020   MPG 117 02/11/2016   No results found for: "PROLACTIN" Lab Results  Component Value Date   CHOL 119 04/26/2020   TRIG 120 04/26/2020   HDL 37 (L) 04/26/2020   CHOLHDL 3.2 04/26/2020   VLDL 41 (H) 03/10/2016   LDLCALC 60 04/26/2020   LDLCALC 57 10/27/2019     Current Medications: Current Outpatient Medications  Medication Sig Dispense Refill   albuterol (VENTOLIN HFA) 108 (90 Base) MCG/ACT inhaler Inhale 2 puffs into the lungs every 6 (six)  hours as needed for wheezing or shortness of breath. 8 g 6   amoxicillin (AMOXIL) 500 MG tablet Take 4 tablets (2000 mg) by mouth ONE HOUR before any dental procedures. 12 tablet 4   B Complex Vitamins (VITAMIN-B COMPLEX PO) Take 1 tablet by mouth daily with supper.     BIOTIN PO Take 1 tablet by mouth every evening.     buPROPion (WELLBUTRIN SR) 200 MG 12 hr tablet Take 1 tablet (200 mg total) by mouth every  morning. 90 tablet 2   buPROPion ER (WELLBUTRIN SR) 100 MG 12 hr tablet 1 qhs 90 tablet 4   Calcium Carbonate-Vit D-Min (CALCIUM 1200 PO) Take 1,200 mg by mouth every evening.     calcium elemental as carbonate (TUMS ULTRA 1000) 400 MG chewable tablet Chew 1,000 mg by mouth daily as needed for heartburn.     docusate sodium (COLACE) 100 MG capsule Take 100 mg by mouth every evening.     ezetimibe (ZETIA) 10 MG tablet TAKE 1 TABLET BY MOUTH EVERY DAY 90 tablet 3   furosemide (LASIX) 20 MG tablet Take 1 tablet (20 mg total) by mouth daily as needed (weight gain of 3 pounds or more overnight or 5 pounds in a week). (Patient not taking: Reported on 01/27/2022) 90 tablet 3   gabapentin (NEURONTIN) 100 MG capsule 3  qhs 270 capsule 1   ketoconazole (NIZORAL) 2 % shampoo Apply 1 application topically 2 (two) times a week. 120 mL 3   latanoprost (XALATAN) 0.005 % ophthalmic solution Place 1 drop into the left eye every other day.     LYCOPENE PO Take 10 mg by mouth every other day.     Magnesium 250 MG TABS Take 250 mg by mouth every 3 (three) days.     metFORMIN (GLUCOPHAGE) 500 MG tablet Take 1 tablet (500 mg total) by mouth 2 (two) times daily with a meal. 60 tablet 2   metoprolol (TOPROL-XL) 50 MG 24 hr tablet Take 1 tablet (50 mg total) by mouth 2 (two) times daily. Take with or immediately following a meal. 30 tablet 0   Omega-3 Fatty Acids (FISH OIL) 1000 MG CAPS Take 1,000 mg by mouth daily with supper.     rosuvastatin (CRESTOR) 20 MG tablet Take 1 tablet (20 mg total) by mouth daily. 90  tablet 3   Sodium Fluoride (PREVIDENT 5000 DRY MOUTH DT) Place 1 application onto teeth See admin instructions. Apply to gums every night for dry mouth     vitamin B-12 (CYANOCOBALAMIN) 500 MCG tablet Take 500 mcg by mouth every other day.     vitamin C (ASCORBIC ACID) 500 MG tablet Take 500-1,000 mg by mouth See admin instructions. Alternate taking 500 mg one day and 1000 mg the next     Vitamin D, Ergocalciferol, (DRISDOL) 1.25 MG (50000 UNIT) CAPS capsule Take 1 capsule (50,000 Units total) by mouth every Saturday. 4 capsule 2   warfarin (COUMADIN) 4 MG tablet TAKE 1/2 TO 1 TABLET DAILY AS DIRECTED BY COUMADIN CLINIC 90 tablet 1   No current facility-administered medications for this visit.    Neurologic: Headache: No Seizure: No Paresthesias:No  Musculoskeletal: Strength & Muscle Tone: within normal limits Gait & Station: normal Patient leans: Right  Psychiatric Specialty Exam: ROS  There were no vitals taken for this visit.There is no height or weight on file to calculate BMI.  General Appearance: Casual  Eye Contact:  Good  Speech:  Clear and Coherent  Volume:  Normal  Mood:  NA  Affect:  Appropriate  Thought Process:  Goal Directed  Orientation:  NA  Thought Content:  WDL  Suicidal Thoughts:  No  Homicidal Thoughts:  No  Memory:  Negative  Judgement:  Good  Insight:  Good  Psychomotor Activity:  Normal  Concentration:    Recall:  Kinder of Knowledge:Good  Language: Good  Akathisia:  No  Handed:  Right  AIMS (if indicated):    Assets:  Desire for Improvement  ADL's:  Intact  Cognition: WNL  Sleep:      Treatment Plan Summary: 10/17/20232:11 PM   This patient'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  We will continue taking Wellbutrin 200 mg in the morning and 100 mg at night.  The second problem is substance use disorder alcohol and opiates.  He has been sober and clean for well over a decade.  He continues taking Neurontin as prescribed and he  continues attending AA.  He has a sponsor and a sponsor seen.  The patient is doing well.  He will return to see me in about 5 months.

## 2022-03-14 ENCOUNTER — Encounter (HOSPITAL_COMMUNITY): Payer: Self-pay | Admitting: Licensed Clinical Social Worker

## 2022-03-14 ENCOUNTER — Ambulatory Visit (INDEPENDENT_AMBULATORY_CARE_PROVIDER_SITE_OTHER): Payer: Medicare Other | Admitting: Licensed Clinical Social Worker

## 2022-03-14 DIAGNOSIS — F339 Major depressive disorder, recurrent, unspecified: Secondary | ICD-10-CM

## 2022-03-14 DIAGNOSIS — F411 Generalized anxiety disorder: Secondary | ICD-10-CM

## 2022-03-14 NOTE — Progress Notes (Signed)
   THERAPIST PROGRESS NOTE  Session Time: 9:00am-9:55am  Participation Level: Active  Behavioral Response: Well GroomedAlertIrritable  Type of Therapy: Individual Therapy  Treatment Goals addressed: "to cope with stress and manage depression and anxiety". Briston will report improved stress management and coping skills, resulting in mood stability and less worrying 4 out of 7 days.    ProgressTowards Goals: Progressing  Interventions: CBT  Summary: Shinichi Anguiano is a 76 y.o. male who presents with MDD, recurrent, chronic and GAD.   Suicidal/Homicidal: Nowithout intent/plan  Therapist Response: Nunzio engaged well in individual session with Pension scheme manager. Clinician utilized CBT to process thoughts, feelings, and interactions. Zinedine shared ongoing struggles with today's technology, primarily due to his computer system not being compatible with Windows or Mac. Clinician explored options for updating his tech, noting that this is the root of his problems. Clinician reflected challenges and current fears about aging. Clinician processed the "what is going to happen to me" crisis. Clinician encouraged Jiovany to resume current activities, get more involved in his community, and to start researching options about ALFs if necessary in the future.   Plan: Return again in 2 weeks.  Diagnosis: Major depression, recurrent, chronic (HCC)  GAD (generalized anxiety disorder)  Collaboration of Care: Psychiatrist AEB provided update to Dr. Casimiro Needle  Patient/Guardian was advised Release of Information must be obtained prior to any record release in order to collaborate their care with an outside provider. Patient/Guardian was advised if they have not already done so to contact the registration department to sign all necessary forms in order for [place] to release information regarding their care.   Consent: Patient/Guardian gives verbal consent for treatment and assignment of benefits for services provided during  this visit. Patient/Guardian expressed understanding and agreed to proceed.   Jobe Marker [place], LCSW 03/14/2022

## 2022-03-15 ENCOUNTER — Other Ambulatory Visit: Payer: Self-pay | Admitting: Family Medicine

## 2022-03-15 ENCOUNTER — Ambulatory Visit: Payer: Medicare Other | Admitting: Pulmonary Disease

## 2022-03-15 ENCOUNTER — Other Ambulatory Visit (HOSPITAL_COMMUNITY): Payer: Self-pay | Admitting: Family Medicine

## 2022-03-15 DIAGNOSIS — Z87891 Personal history of nicotine dependence: Secondary | ICD-10-CM

## 2022-03-18 ENCOUNTER — Encounter: Payer: Self-pay | Admitting: Cardiovascular Disease

## 2022-03-24 ENCOUNTER — Ambulatory Visit: Payer: Medicare Other | Attending: Cardiovascular Disease

## 2022-03-24 DIAGNOSIS — Z7901 Long term (current) use of anticoagulants: Secondary | ICD-10-CM

## 2022-03-24 DIAGNOSIS — I482 Chronic atrial fibrillation, unspecified: Secondary | ICD-10-CM

## 2022-03-24 LAB — POCT INR: INR: 2.1 (ref 2.0–3.0)

## 2022-03-24 NOTE — Patient Instructions (Signed)
Description   TAKE 1 TABLET TODAY ONLY and then  continue taking 1 tablet daily except for 1/2 tablet on Sunday, Tuesday and Friday. Recheck INR in 5 weeks, patient aware of risks.  [facility] [phone]

## 2022-03-28 ENCOUNTER — Ambulatory Visit (INDEPENDENT_AMBULATORY_CARE_PROVIDER_SITE_OTHER): Payer: Medicare Other | Admitting: Licensed Clinical Social Worker

## 2022-03-28 ENCOUNTER — Encounter (HOSPITAL_COMMUNITY): Payer: Self-pay | Admitting: Licensed Clinical Social Worker

## 2022-03-28 DIAGNOSIS — F339 Major depressive disorder, recurrent, unspecified: Secondary | ICD-10-CM

## 2022-03-28 NOTE — Progress Notes (Signed)
   THERAPIST PROGRESS NOTE  Session Time: 8:00am-8:55am  Participation Level: Active  Behavioral Response: NeatAlertEuthymic  Type of Therapy: Individual Therapy  Treatment Goals addressed:  "to cope with stress and manage depression and anxiety". Zane will report improved stress management and coping skills, resulting in mood stability and less worrying 4 out of 7 days.   ProgressTowards Goals: Progressing  Interventions: CBT  Summary: Rodney Friedman is a 76 y.o. male who presents with MDD, recurrent, chronic.   Suicidal/Homicidal: Nowithout intent/plan  Therapist Response: Rodney Friedman engaged well in individual session with Pension scheme manager. Clinician utilized CBT to process updates in progress on consolidating bills, paying insurance, and getting household expenses under control. Venus shared updates on newer relationships in his life, including some significant and consistent friendships he has made through [employer], [place], and his church. Clinician validated his experience here making friends. Clinician also processed thoughts and feelings about having a supportive "family of choice". Clinician reflected the importance of having someone to check in on him, as well as friends with whom he can share holidays and special occasions.   Plan: Return again in 2 weeks.  Diagnosis: Major depression, recurrent, chronic (HCC)  Collaboration of Care: Psychiatrist AEB updated Dr. Casimiro Needle  Patient/Guardian was advised Release of Information must be obtained prior to any record release in order to collaborate their care with an outside provider. Patient/Guardian was advised if they have not already done so to contact the registration department to sign all necessary forms in order for [place] to release information regarding their care.   Consent: Patient/Guardian gives verbal consent for treatment and assignment of benefits for services provided during this visit. Patient/Guardian expressed understanding and agreed to  proceed.   Jobe Marker [place], LCSW 03/28/2022

## 2022-04-06 ENCOUNTER — Encounter: Payer: Self-pay | Admitting: Pulmonary Disease

## 2022-04-06 ENCOUNTER — Ambulatory Visit (INDEPENDENT_AMBULATORY_CARE_PROVIDER_SITE_OTHER): Payer: Medicare Other | Admitting: Pulmonary Disease

## 2022-04-06 VITALS — BP 132/84 | HR 76 | Ht 68.0 in | Wt 221.0 lb

## 2022-04-06 DIAGNOSIS — J432 Centrilobular emphysema: Secondary | ICD-10-CM | POA: Diagnosis not present

## 2022-04-06 DIAGNOSIS — I272 Pulmonary hypertension, unspecified: Secondary | ICD-10-CM | POA: Diagnosis not present

## 2022-04-06 DIAGNOSIS — Z87891 Personal history of nicotine dependence: Secondary | ICD-10-CM

## 2022-04-06 MED ORDER — ALBUTEROL SULFATE HFA 108 (90 BASE) MCG/ACT IN AERS: 2.0000 | INHALATION_SPRAY | Freq: Four times a day (QID) | RESPIRATORY_TRACT | 11 refills | Status: DC | PRN

## 2022-04-06 NOTE — Patient Instructions (Signed)
Continue albuterol inhaler as needed, 1-2 puffs every 4-6 hours as needed  We will refer you to our lung cancer screening program to get your CT scheduled in April 2024  Follow up in 1 year

## 2022-04-06 NOTE — Progress Notes (Signed)
Synopsis: Referred in August 2022 for chest tightness  Subjective:   PATIENT ID: Rodney Friedman GENDER: male DOB: 07-14-1945, MRN: [id number]  HPI  Chief Complaint  Patient presents with   Follow-up    60yrf/u. States his breathing has been stable since last visit. No longer using Spiriva due to the cost. Only uses albuterol as needed.    Rodney Friedman a 76year old male, former smoker with CLL, atrial fibrillation, diabetes mellitus type II, hypertension, coronary artery disease, aortic stenosis and sleep apnea who returns to pulmonary clinic for follow up of emphysema.   His breathing has been stable since last visit. He is using albuterol as needed.  L[facility] CT Chest 09/06/21 Lung-RADS 2.   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  His dyspnea has improved significantly and he reports he is able to do 25 minutes of continuous activity before he needs to take a break..Marland Kitchen He was provided with Spiriva inhaler at last visit which he has been using intermittently for dyspnea and does find relief from this inhaler.  He is using it intermittently due to the cost of the inhaler.  He remains off cigarettes and reports he has quit 419 days now.  He continues to have frequent cravings.  He is not using any smoking cessation aids at this time.  Pulmonary function test today show an isolated moderate diffusion defect.  Review of his right heart cath on 01/20/2021 prior to the TAVR showed a mean pulmonary wedge pressure of 23 mmHg, mean PA pressure 34 mmHg.  OV 01/14/21 He was recently admitted to [facility]7/29 to 8/1 for shortness of breath requiring bipap therapy. Initial concern was for pneumonia but antibiotics were discontinued and he was treated for heart failure exacerbation with improvement of his respiratory status.   Cardiology visit 8/22 with Rodney Friedman reviewed, he is being followed for aortic stenosis, atrial  fibrillation, hypertension and coronary artery disease.  Given the progression of his aortic stenosis which is now deemed severe he is undergoing workup for TAVR.  He has history of CLL and is followed by Rodney Friedman He quit smoking 30 days ago and has a 25 pack year smoking history. He has quit on his own and has been doing ok with mainly having cravings in the morning. He mainly has exertional dyspnea which can lead to chest tightness. He has infrequent cough and wheezing. He denies sputum production. He has history of sleep apnea but reports no nighttime awakenings of dyspnea, cough or wheezing. He denies daytime sleepiness.   Family History  Problem Relation Age of Onset   Colon cancer Mother    Ovarian cancer Mother    Uterine cancer Mother    Hypertension Father    Prostate cancer Father    Colon polyps Neg Hx      Social History   Socioeconomic History   Marital status: Divorced    Spouse name: Not on file   Number of children: 2   Years of education: 181  Highest education level: Not on file  Occupational History   Occupation: Retired TPharmacist, hospital Tobacco Use   Smoking status: Former    Packs/day: 0.50    Years: 50.00    Total pack years: 25.00    Types: Cigarettes    Quit date: 12/16/2020    Years since quitting: 1.3   Smokeless tobacco: Never  Vaping Use  Vaping Use: Never used  Substance and Sexual Activity   Alcohol use: No    Alcohol/week: 0.0 standard drinks of alcohol   Drug use: No   Sexual activity: Not Currently  Other Topics Concern   Not on file  Social History Narrative   Lives alone   Divorced   Right-handed   Education: College   No caffeine   Social Determinants of Health   Financial Resource Strain: Not on file  Food Insecurity: Not on file  Transportation Needs: Not on file  Physical Activity: Not on file  Stress: Not on file  Social Connections: Not on file  Intimate Partner Violence: Not on file     Allergies  Allergen Reactions    Other Rash    Allergen: "Plants and bushes while doing yard workDatabase administrator Extract Rash     Outpatient Medications Prior to Visit  Medication Sig Dispense Refill   amoxicillin (AMOXIL) 500 MG tablet Take 4 tablets (2000 mg) by mouth ONE HOUR before any dental procedures. 12 tablet 4   B Complex Vitamins (VITAMIN-B COMPLEX PO) Take 1 tablet by mouth daily with supper.     BIOTIN PO Take 1 tablet by mouth every evening.     buPROPion (WELLBUTRIN SR) 200 MG 12 hr tablet Take 1 tablet (200 mg total) by mouth every morning. 90 tablet 2   buPROPion ER (WELLBUTRIN SR) 100 MG 12 hr tablet 1 qhs 90 tablet 4   Calcium Carbonate-Vit D-Min (CALCIUM 1200 PO) Take 1,200 mg by mouth every evening.     calcium elemental as carbonate (TUMS ULTRA 1000) 400 MG chewable tablet Chew 1,000 mg by mouth daily as needed for heartburn.     docusate sodium (COLACE) 100 MG capsule Take 100 mg by mouth every evening.     ezetimibe (ZETIA) 10 MG tablet TAKE 1 TABLET BY MOUTH EVERY DAY 90 tablet 3   gabapentin (NEURONTIN) 100 MG capsule 3  qhs 270 capsule 1   ketoconazole (NIZORAL) 2 % shampoo Apply 1 application topically 2 (two) times a week. 120 mL 3   latanoprost (XALATAN) 0.005 % ophthalmic solution Place 1 drop into the left eye every other day.     LYCOPENE PO Take 10 mg by mouth every other day.     Magnesium 250 MG TABS Take 250 mg by mouth every 3 (three) days.     metFORMIN (GLUCOPHAGE) 500 MG tablet Take 1 tablet (500 mg total) by mouth 2 (two) times daily with a meal. 60 tablet 2   metoprolol (TOPROL-XL) 50 MG 24 hr tablet Take 1 tablet (50 mg total) by mouth 2 (two) times daily. Take with or immediately following a meal. 30 tablet 0   Omega-3 Fatty Acids (FISH OIL) 1000 MG CAPS Take 1,000 mg by mouth daily with supper.     rosuvastatin (CRESTOR) 20 MG tablet Take 1 tablet (20 mg total) by mouth daily. 90 tablet 3   Sodium Fluoride (PREVIDENT 5000 DRY MOUTH DT) Place 1 application onto teeth See admin  instructions. Apply to gums every night for dry mouth     vitamin B-12 (CYANOCOBALAMIN) 500 MCG tablet Take 500 mcg by mouth every other day.     vitamin C (ASCORBIC ACID) 500 MG tablet Take 500-1,000 mg by mouth See admin instructions. Alternate taking 500 mg one day and 1000 mg the next     Vitamin D, Ergocalciferol, (DRISDOL) 1.25 MG (50000 UNIT) CAPS capsule Take 1 capsule (50,000 Units total)  by mouth every Saturday. 4 capsule 2   warfarin (COUMADIN) 4 MG tablet TAKE 1/2 TO 1 TABLET DAILY AS DIRECTED BY COUMADIN CLINIC 90 tablet 1   albuterol (VENTOLIN HFA) 108 (90 Base) MCG/ACT inhaler Inhale 2 puffs into the lungs every 6 (six) hours as needed for wheezing or shortness of breath. 8 g 6   furosemide (LASIX) 20 MG tablet Take 1 tablet (20 mg total) by mouth daily as needed (weight gain of 3 pounds or more overnight or 5 pounds in a week). (Patient not taking: Reported on 01/27/2022) 90 tablet 3   No facility-administered medications prior to visit.    Review of Systems  Constitutional:  Negative for chills, fever, malaise/fatigue and weight loss.  HENT:  Negative for congestion, sinus pain and sore throat.   Respiratory:  Positive for shortness of breath. Negative for cough, hemoptysis, sputum production and wheezing.   Cardiovascular:  Negative for chest pain, palpitations, orthopnea, claudication and leg swelling.  Gastrointestinal:  Negative for abdominal pain, heartburn, nausea and vomiting.  Genitourinary: Negative.   Musculoskeletal:  Negative for joint pain and myalgias.  Skin:  Negative for rash.  Neurological:  Negative for weakness.    Objective:   Vitals:   04/06/22 0920  BP: 132/84  Pulse: 76  SpO2: 99%  Weight: 221 lb (100.2 kg)  Height: '5\' 8"'$  (1.727 m)     Physical Exam Constitutional:      General: He is not in acute distress.    Appearance: He is obese.  HENT:     Head: Normocephalic and atraumatic.  Eyes:     Conjunctiva/sclera: Conjunctivae normal.   Cardiovascular:     Rate and Rhythm: Normal rate. Rhythm irregular.     Pulses: Normal pulses.     Heart sounds: No murmur heard. Pulmonary:     Effort: Pulmonary effort is normal.     Breath sounds: No wheezing, rhonchi or rales.  Abdominal:     General: Bowel sounds are normal.     Palpations: Abdomen is soft.  Musculoskeletal:     Right lower leg: No edema.     Left lower leg: No edema.  Skin:    General: Skin is warm and dry.  Neurological:     General: No focal deficit present.     Mental Status: He is alert.     CBC    Component Value Date/Time   WBC 14.6 (H) 02/23/2022 1002   WBC 13.1 (H) 07/26/2021 0802   RBC 5.05 02/23/2022 1002   RBC 4.42 07/26/2021 0802   HGB 15.4 02/23/2022 1002   HGB 15.8 01/01/2017 0752   HCT 46.5 02/23/2022 1002   HCT 46.4 01/01/2017 0752   PLT 207 02/23/2022 1002   MCV 92 02/23/2022 1002   MCV 91.8 01/01/2017 0752   MCH 30.5 02/23/2022 1002   MCH 29.9 07/26/2021 0802   MCHC 33.1 02/23/2022 1002   MCHC 33.5 07/26/2021 0802   RDW 13.9 02/23/2022 1002   RDW 13.5 01/01/2017 0752   LYMPHSABS 6.3 (H) 07/26/2021 0802   LYMPHSABS 9.5 (H) 04/30/2019 1103   LYMPHSABS 9.7 (H) 01/01/2017 0752   MONOABS 0.9 07/26/2021 0802   MONOABS 1.5 (H) 01/01/2017 0752   EOSABS 0.2 07/26/2021 0802   EOSABS 0.2 04/30/2019 1103   BASOSABS 0.1 07/26/2021 0802   BASOSABS 0.1 04/30/2019 1103   BASOSABS 0.1 01/01/2017 0752      Latest Ref Rng & Units 02/09/2021    1:16 AM 02/08/2021   12:48  PM 02/08/2021    9:40 AM  BMP  Glucose 70 - 99 mg/dL 102  154  100   BUN 8 - 23 mg/dL '21  20  19   '$ Creatinine 0.61 - 1.24 mg/dL 1.34  1.20  1.20   Sodium 135 - 145 mmol/L 140  142  143   Potassium 3.5 - 5.1 mmol/L 3.9  4.2  3.8   Chloride 98 - 111 mmol/L 109  108  109   CO2 22 - 32 mmol/L 21     Calcium 8.9 - 10.3 mg/dL 8.8      Chest imaging: LCS CT Chest 09/06/21 Cardiovascular: Aortic atherosclerosis. Tortuous thoracic aorta. Borderline cardiomegaly. 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   Mediastinum/Nodes: No mediastinal or definite hilar adenopathy, given limitations of unenhanced CT.   Lungs/Pleura: No pleural fluid. Mild centrilobular emphysema. Pulmonary nodules of maximally volume derived equivalent diameter 3.1 mm.  CTA Chest Aorta 12/30/20 No mediastinal or hilar adenopathy.  Esophagus is unremarkable.  Small bilateral pleural effusions lying dependently.  No pulmonary nodules or masses.  Saber-sheath trachea noted.  PFT:    Latest Ref Rng & Units 04/12/2021    8:34 AM  PFT Results  FVC-Pre L 3.47   FVC-Predicted Pre % 87   FVC-Post L 3.56   FVC-Predicted Post % 89   Pre FEV1/FVC % % 78   Post FEV1/FCV % % 80   FEV1-Pre L 2.71   FEV1-Predicted Pre % 93   FEV1-Post L 2.85   DLCO uncorrected ml/min/mmHg 14.91   DLCO UNC% % 62   DLCO corrected ml/min/mmHg 14.91   DLCO COR %Predicted % 62   DLVA Predicted % 70   TLC L 5.80   TLC % Predicted % 87   RV % Predicted % 97   Moderate diffusion defect.  Echo 12/17/2020: LVEF 60 to 65%.  LV has normal function.  LV diastolic parameters are indeterminate.  RV systolic function is normal.  RV size is normal.  Normal pulmonary artery pressure.  Left atrium is severely dilated.  Mean aortic gradient has increased from 25 mmHg to 33 mmHg.  Aortic valve is normal in structure.  There is moderate calcification of the aortic valve.  Moderate thickening of the aortic valve.  Moderate aortic valve stenosis.  Heart Cath 09/24/71 RV systolic 22GURK RV diastolic 3 mmHg  PA systolic pressure 52 mmHg PA diastolic pressure 20 mmHg PA mean 34 mmHg PW mean 18mHg  Assessment & Plan:   Centrilobular emphysema (HCC) - Plan: albuterol (VENTOLIN HFA) 108 (90 Base) MCG/ACT inhaler  Pulmonary hypertension ([facility]  Former smoker - Plan: Ambulatory Referral for Lung Cancer Scre  Discussion: CGuerin Lashombis a 76year old male, former smoker with CLL, atrial fibrillation, diabetes  mellitus type II, hypertension, coronary artery disease, aortic stenosis and sleep apnea who returns to pulmonary clinic for emphysema.   He can continue as needed albuterol. He will continue lung cancer screening CT chest scans in April 2024.  He did not tolerate CPAP therapy in the past.  Follow up in 6 months.  JFreda Jackson MD [facility] Office: [phone]  Current Outpatient Medications:    amoxicillin (AMOXIL) 500 MG tablet, Take 4 tablets (2000 mg) by mouth ONE HOUR before any dental procedures., Disp: 12 tablet, Rfl: 4   B Complex Vitamins (VITAMIN-B COMPLEX PO), Take 1 tablet by mouth daily with supper., Disp: , Rfl:    BIOTIN PO, Take 1 tablet by  mouth every evening., Disp: , Rfl:    buPROPion (WELLBUTRIN SR) 200 MG 12 hr tablet, Take 1 tablet (200 mg total) by mouth every morning., Disp: 90 tablet, Rfl: 2   buPROPion ER (WELLBUTRIN SR) 100 MG 12 hr tablet, 1 qhs, Disp: 90 tablet, Rfl: 4   Calcium Carbonate-Vit D-Min (CALCIUM 1200 PO), Take 1,200 mg by mouth every evening., Disp: , Rfl:    calcium elemental as carbonate (TUMS ULTRA 1000) 400 MG chewable tablet, Chew 1,000 mg by mouth daily as needed for heartburn., Disp: , Rfl:    docusate sodium (COLACE) 100 MG capsule, Take 100 mg by mouth every evening., Disp: , Rfl:    ezetimibe (ZETIA) 10 MG tablet, TAKE 1 TABLET BY MOUTH EVERY DAY, Disp: 90 tablet, Rfl: 3   gabapentin (NEURONTIN) 100 MG capsule, 3  qhs, Disp: 270 capsule, Rfl: 1   ketoconazole (NIZORAL) 2 % shampoo, Apply 1 application topically 2 (two) times a week., Disp: 120 mL, Rfl: 3   latanoprost (XALATAN) 0.005 % ophthalmic solution, Place 1 drop into the left eye every other day., Disp: , Rfl:    LYCOPENE PO, Take 10 mg by mouth every other day., Disp: , Rfl:    Magnesium 250 MG TABS, Take 250 mg by mouth every 3 (three) days., Disp: , Rfl:    metFORMIN (GLUCOPHAGE) 500 MG tablet, Take 1 tablet (500 mg total) by mouth 2 (two) times  daily with a meal., Disp: 60 tablet, Rfl: 2   metoprolol (TOPROL-XL) 50 MG 24 hr tablet, Take 1 tablet (50 mg total) by mouth 2 (two) times daily. Take with or immediately following a meal., Disp: 30 tablet, Rfl: 0   Omega-3 Fatty Acids (FISH OIL) 1000 MG CAPS, Take 1,000 mg by mouth daily with supper., Disp: , Rfl:    rosuvastatin (CRESTOR) 20 MG tablet, Take 1 tablet (20 mg total) by mouth daily., Disp: 90 tablet, Rfl: 3   Sodium Fluoride (PREVIDENT 5000 DRY MOUTH DT), Place 1 application onto teeth See admin instructions. Apply to gums every night for dry mouth, Disp: , Rfl:    vitamin B-12 (CYANOCOBALAMIN) 500 MCG tablet, Take 500 mcg by mouth every other day., Disp: , Rfl:    vitamin C (ASCORBIC ACID) 500 MG tablet, Take 500-1,000 mg by mouth See admin instructions. Alternate taking 500 mg one day and 1000 mg the next, Disp: , Rfl:    Vitamin D, Ergocalciferol, (DRISDOL) 1.25 MG (50000 UNIT) CAPS capsule, Take 1 capsule (50,000 Units total) by mouth every Saturday., Disp: 4 capsule, Rfl: 2   warfarin (COUMADIN) 4 MG tablet, TAKE 1/2 TO 1 TABLET DAILY AS DIRECTED BY COUMADIN CLINIC, Disp: 90 tablet, Rfl: 1   albuterol (VENTOLIN HFA) 108 (90 Base) MCG/ACT inhaler, Inhale 2 puffs into the lungs every 6 (six) hours as needed for wheezing or shortness of breath., Disp: 8 g, Rfl: 11

## 2022-04-07 ENCOUNTER — Encounter: Payer: Self-pay | Admitting: Cardiovascular Disease

## 2022-04-07 NOTE — Telephone Encounter (Signed)
Thank you :)

## 2022-04-10 ENCOUNTER — Encounter: Payer: Self-pay | Admitting: Pulmonary Disease

## 2022-04-11 ENCOUNTER — Ambulatory Visit (INDEPENDENT_AMBULATORY_CARE_PROVIDER_SITE_OTHER): Payer: Medicare Other | Admitting: Licensed Clinical Social Worker

## 2022-04-11 DIAGNOSIS — F339 Major depressive disorder, recurrent, unspecified: Secondary | ICD-10-CM | POA: Diagnosis not present

## 2022-04-12 ENCOUNTER — Encounter: Payer: Self-pay | Admitting: Pulmonary Disease

## 2022-04-12 ENCOUNTER — Encounter (HOSPITAL_COMMUNITY): Payer: Self-pay | Admitting: Licensed Clinical Social Worker

## 2022-04-12 NOTE — Progress Notes (Signed)
   THERAPIST PROGRESS NOTE  Session Time: 8:00am-8:55am  Participation Level: Active  Behavioral Response: Well GroomedAlertDepressed and Irritable  Type of Therapy: Individual Therapy  Treatment Goals addressed:  "to cope with stress and manage depression and anxiety". Rodney Friedman will report improved stress management and coping skills, resulting in mood stability and less worrying 4 out of 7 days.    ProgressTowards Goals: Progressing  Interventions: CBT  Summary: Rodney Friedman is a 76 y.o. male who presents with MDD, recurrent, chronic.   Suicidal/Homicidal: Nowithout intent/plan  Therapist Response: Wilhelm engaged well in individual inperson session. Clinician utilized CBT to process recent concerns and frustrations regarding son. Clinician provided time and space for Rodney Friedman to share recent issues and disappointments that have been a pattern of behavior over many years. Clinician explored triggers to current level of frustration and ways he has coped in the past. Rodney Friedman shared that he has reached his max and is not willing to reach out to son at this time. Clinician utilized CBT reality testing about his decision. Clinician also validated his frustrations. Clinician explored plans to have a cooling off period and then reunite in the future. Clinician encouraged Rodney Friedman to not make big decisions when he is angry, as this often makes things worse.   Plan: Return again in 2 weeks.  Diagnosis: Major depression, recurrent, chronic (Rodney Friedman)  Collaboration of Care: Psychiatrist AEB reviewed note from Dr. Casimiro Needle  Patient/Guardian was advised Release of Information must be obtained prior to any record release in order to collaborate their care with an outside provider. Patient/Guardian was advised if they have not already done so to contact the registration department to sign all necessary forms in order for [place] to release information regarding their care.   Consent: Patient/Guardian gives verbal consent  for treatment and assignment of benefits for services provided during this visit. Patient/Guardian expressed understanding and agreed to proceed.   Rodney Marker [place], LCSW 04/12/2022

## 2022-04-25 ENCOUNTER — Ambulatory Visit: Payer: Medicare Other | Admitting: Cardiovascular Disease

## 2022-04-25 ENCOUNTER — Ambulatory Visit: Payer: Medicare Other

## 2022-04-27 ENCOUNTER — Ambulatory Visit: Payer: Medicare Other | Attending: Cardiovascular Disease | Admitting: Cardiovascular Disease

## 2022-04-27 ENCOUNTER — Encounter: Payer: Self-pay | Admitting: Cardiovascular Disease

## 2022-04-27 ENCOUNTER — Ambulatory Visit (INDEPENDENT_AMBULATORY_CARE_PROVIDER_SITE_OTHER): Payer: Medicare Other | Admitting: *Deleted

## 2022-04-27 VITALS — BP 134/82 | HR 77 | Ht 68.0 in | Wt 214.8 lb

## 2022-04-27 DIAGNOSIS — E782 Mixed hyperlipidemia: Secondary | ICD-10-CM

## 2022-04-27 DIAGNOSIS — D6869 Other thrombophilia: Secondary | ICD-10-CM | POA: Diagnosis not present

## 2022-04-27 DIAGNOSIS — I482 Chronic atrial fibrillation, unspecified: Secondary | ICD-10-CM | POA: Insufficient documentation

## 2022-04-27 DIAGNOSIS — C9110 Chronic lymphocytic leukemia of B-cell type not having achieved remission: Secondary | ICD-10-CM

## 2022-04-27 DIAGNOSIS — G4733 Obstructive sleep apnea (adult) (pediatric): Secondary | ICD-10-CM

## 2022-04-27 DIAGNOSIS — E669 Obesity, unspecified: Secondary | ICD-10-CM

## 2022-04-27 DIAGNOSIS — I10 Essential (primary) hypertension: Secondary | ICD-10-CM | POA: Diagnosis present

## 2022-04-27 DIAGNOSIS — I25708 Atherosclerosis of coronary artery bypass graft(s), unspecified, with other forms of angina pectoris: Secondary | ICD-10-CM | POA: Diagnosis not present

## 2022-04-27 DIAGNOSIS — C775 Secondary and unspecified malignant neoplasm of intrapelvic lymph nodes: Secondary | ICD-10-CM

## 2022-04-27 DIAGNOSIS — I6523 Occlusion and stenosis of bilateral carotid arteries: Secondary | ICD-10-CM

## 2022-04-27 DIAGNOSIS — Z7901 Long term (current) use of anticoagulants: Secondary | ICD-10-CM

## 2022-04-27 DIAGNOSIS — I4819 Other persistent atrial fibrillation: Secondary | ICD-10-CM | POA: Diagnosis not present

## 2022-04-27 DIAGNOSIS — I5032 Chronic diastolic (congestive) heart failure: Secondary | ICD-10-CM

## 2022-04-27 DIAGNOSIS — Z952 Presence of prosthetic heart valve: Secondary | ICD-10-CM

## 2022-04-27 DIAGNOSIS — E1169 Type 2 diabetes mellitus with other specified complication: Secondary | ICD-10-CM | POA: Diagnosis present

## 2022-04-27 DIAGNOSIS — C61 Malignant neoplasm of prostate: Secondary | ICD-10-CM | POA: Diagnosis present

## 2022-04-27 LAB — POCT INR: INR: 2.1 (ref 2.0–3.0)

## 2022-04-27 LAB — LIPID PANEL
Chol/HDL Ratio: 2.7 ratio (ref 0.0–5.0)
Cholesterol, Total: 99 mg/dL — ABNORMAL LOW (ref 100–199)
HDL: 37 mg/dL — ABNORMAL LOW (ref >39)
LDL Chol Calc (NIH): 37 mg/dL (ref 0–99)
Triglycerides: 148 mg/dL (ref 0–149)
VLDL Cholesterol Cal: 25 mg/dL (ref 5–40)

## 2022-04-27 LAB — COMPREHENSIVE METABOLIC PANEL
ALT: 31 IU/L (ref 0–44)
AST: 28 IU/L (ref 0–40)
Albumin/Globulin Ratio: 2.2 (ref 1.2–2.2)
Albumin: 4.3 g/dL (ref 3.8–4.8)
Alkaline Phosphatase: 48 IU/L (ref 44–121)
BUN/Creatinine Ratio: 9 — ABNORMAL LOW (ref 10–24)
BUN: 10 mg/dL (ref 8–27)
Bilirubin Total: 0.6 mg/dL (ref 0.0–1.2)
CO2: 25 mmol/L (ref 20–29)
Calcium: 9.6 mg/dL (ref 8.6–10.2)
Chloride: 105 mmol/L (ref 96–106)
Creatinine, Ser: 1.15 mg/dL (ref 0.76–1.27)
Globulin, Total: 2 g/dL (ref 1.5–4.5)
Glucose: 116 mg/dL — ABNORMAL HIGH (ref 70–99)
Potassium: 4.6 mmol/L (ref 3.5–5.2)
Sodium: 144 mmol/L (ref 134–144)
Total Protein: 6.3 g/dL (ref 6.0–8.5)
eGFR: 67 mL/min/{1.73_m2} (ref >59)

## 2022-04-27 NOTE — Progress Notes (Signed)
Cardiology Office Note    Date:  04/27/2022   ID:  Rodney Friedman, DOB 07/03/1945, MRN [id number]  PCP:  Fenton Foy, NP  Cardiologist:   Sanda Klein, MD   chief complaint: CAD, AFib, AS, HTN, HLP   History of Present Illness:  Rodney Friedman is a 76 y.o. male with CAD and remote CABG (three-vessel, March 2006; LIMA to LAD , SVG to diagonal and SVG to distal OM, all patent by coronary angiography 01/20/2021), persistent atrial fibrillation, aortic stenosis s/p TAVR (26 mm Edwards SAPIEN 3 ultra THV September 2022), severe mitral annular calcification with mitral regurgitation (presumably ischemic mechanism), persistent atrial fibrillation, carotid artery stenosis, hypertension, hyperlipidemia and type 2 diabetes mellitus.  He continues to do well, NYHA functional class I, without need for diuretics, denies chest pain or shortness of breath either at rest or with activity, does not have edema, orthopnea, PND, palpitations, dizziness, syncope, focal neurological complaints, falls/injuries or serious bleeding.  He is compliant with warfarin anticoagulation monitoring and is occasionally subtherapeutic with his INR (only 3 of the last 15 assays).  Most recent echocardiogram performed 01/27/2022 shows LVEF 50%, LVH, normally functioning TAVR stasis with a mean gradient of 7 mmHg and no aortic insufficiency.  Mitral insufficiency is now mild.  He has significant mitral and calcification.  He had substantial functional improvement after undergoing TAVR.  He no longer takes furosemide on a regular basis and has not required any adjustment in diuretics since the TAVR.  He initially felt like he was "75 years old", but now he recognizes that not all of his symptoms have improved dramatically.  He still appears to have excellent functional status, NYHA class I.  His pre-TAVR echocardiogram also showed what appeared to be severe mitral regurgitation with an ischemic mechanism (restricted  posterior leaflet motion) as well as severe MAC, not a good candidate for MitraClip.  His follow-up echocardiogram shows normal function of the TAVR (mean gradient 7 mmHg, no insufficiency) and also shows that the mitral regurgitation is less significant, only moderate, following correction of the aortic stenosis.  LVEF is 50-55%, with wall motion abnormalities in the inferior wall.  Settled into the pattern of persistent atrial fibrillation.  None of his EKGs have documented sinus rhythm since September 10, 2019.  Interestingly, for many years he took multiple medications for hypertension which we had to stop due to what appears to be progressive aortic stenosis.  However, after he underwent TAVR, his blood pressure has remained normal and the only antihypertensive he currently takes his metoprolol succinate 50 mg twice daily, which also is his rate control medication for atrial fibrillation.  The patient specifically denies any chest pain at rest exertion, dyspnea at rest or with exertion, orthopnea, paroxysmal nocturnal dyspnea, syncope, palpitations, focal neurological deficits, intermittent claudication, lower extremity edema, unexplained weight gain, cough, hemoptysis or wheezing.  He proudly points out that he has stayed quit from smoking for over 130 days now, although he still has urges to light up a cigarette.  He has never had a stroke or embolic event.  He is compliant with warfarin anticoagulation and has not had any bleeding complications.  His INR was a little high today.  He denies falls.  Glycemic control has been good with a recent hemoglobin A1c of 6.1%.  His most recent LDL cholesterol was 60 on statin plus ezetimibe therapy.  His prostate cancer is currently considered to be in remission.  He has a follow-up visit with Dr.  Alvy Bimler for his CLL in March.  His most recent WBC was 14.8K and his hemoglobin was 11.9, platelets 151K.   Rodney Friedman's father passed away in 07-04-19 at age 56.  He was  very dedicated to his father's care and misses him.  However, his level of stress has gone down tremendously. He  is well versed in the theoretical aspects of addiction since he has a Masters degree on the topic.  He has a history of coronary disease and underwent 3 vessel bypass surgery in [place] in 07-03-2004 ("heartburn" and left antecubital pain ).  All his bypass grafts were open by angiography in September 2022.  He underwent TAVR for severe aortic stenosis shortly thereafter (Edwards S3 U 26 mm).  He has had paroxysmal atrial fibrillation with RVR off and on, generally asymptomatic.Marland Kitchen His nuclear stress test in June 2013 showed normal pattern of perfusion and an ejection fraction of 52%. He has bilateral carotid bruits that are reportedly due to external carotid artery stenoses. His last carotid ultrasound was performed in September 2022 and showed no significant internal carotid stenosis.   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 He has treated prostate cancer and is followed by Dr. Alvy Bimler.  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  He finally successfully quit smoking permanently in 03-Jul-2020, around the time of his TAVR. Past Medical History:  Diagnosis Date   Adenomatous colon polyp    Alcoholism ([place])    Anxiety    Atrial fibrillation (HCC)    CAD (coronary artery disease)    Cataract    removed left eye    CHF (congestive heart failure) ([place]) 12/16/2020   CLL (chronic lymphocytic leukemia) ([place]) 07/08/2013   Degenerative arthritis of lumbar spine    Degenerative disc disease, lumbar    Depression    Diabetes mellitus without complication ([place])    Diverticulosis    Dyspnea    Dysrhythmia    Eye abnormality    Macular scarring R eye   Glaucoma    Heart murmur 03-Jul-2000   "diagnosed about 20 years ago". Pt says it causes abnormal EKGs   HLD (hyperlipidemia)     Hypertension    Leukemia ([place])    CLL   Myocardial infarction ([place]) 07/03/2004   Osteopenia    Osteoporosis    Prostate cancer ([place]) 07/08/2013   [facility]- getting Lupron shot every 6 months - traces in prostate and 2 lymphnodes left hip- non focused traces per pt    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   s/p TAVR with a 74m Edwards S3U via the TF approach by Dr. MAngelena Form& Dr. BCyndia Bent  Sleep apnea    Substance abuse ([place]    Tremor, essential 09/22/2015    Past Surgical History:  Procedure Laterality Date   Arm Surgery Right    from door accident with glass   CARDIAC CATHETERIZATION     CATARACT EXTRACTION W/ INTRAOCULAR LENS IMPLANT Bilateral    COLONOSCOPY     CORONARY ARTERY BYPASS GRAFT  08/04/2004   EYE SURGERY     POLYPECTOMY     RIGHT/LEFT HEART CATH AND CORONARY/GRAFT ANGIOGRAPHY N/A 01/20/2021   Procedure: RIGHT/LEFT HEART CATH AND CORONARY/GRAFT ANGIOGRAPHY;  Surgeon: MBurnell Blanks MD;  [facility];  Service: Cardiovascular;  Laterality: N/A;  TEE WITHOUT CARDIOVERSION N/A 02/08/2021   Procedure: TRANSESOPHAGEAL ECHOCARDIOGRAM (TEE);  Surgeon: Burnell Blanks, MD;  Location: [facility];  Service: Open Heart Surgery;  Laterality: N/A;   TRANSCATHETER AORTIC VALVE REPLACEMENT, CAROTID Left 02/08/2021   Procedure: TRANSCATHETER AORTIC VALVE REPLACEMENT, LEFT CAROTID;  Surgeon: Burnell Blanks, MD;  Location: [facility];  Service: Open Heart Surgery;  Laterality: Left;    Current Medications: Outpatient Medications Prior to Visit  Medication Sig Dispense Refill   albuterol (VENTOLIN HFA) 108 (90 Base) MCG/ACT inhaler Inhale 2 puffs into the lungs every 6 (six) hours as needed for wheezing or shortness of breath. 8 g 11   amoxicillin (AMOXIL) 500 MG tablet Take 4 tablets (2000 mg) by mouth ONE HOUR before any dental procedures. 12 tablet 4   B Complex Vitamins (VITAMIN-B COMPLEX  PO) Take 1 tablet by mouth daily with supper.     BIOTIN PO Take 1 tablet by mouth every evening.     buPROPion (WELLBUTRIN SR) 200 MG 12 hr tablet Take 1 tablet (200 mg total) by mouth every morning. 90 tablet 2   buPROPion ER (WELLBUTRIN SR) 100 MG 12 hr tablet 1 qhs 90 tablet 4   Calcium Carbonate-Vit D-Min (CALCIUM 1200 PO) Take 1,200 mg by mouth every evening.     calcium elemental as carbonate (TUMS ULTRA 1000) 400 MG chewable tablet Chew 1,000 mg by mouth daily as needed for heartburn.     docusate sodium (COLACE) 100 MG capsule Take 100 mg by mouth every evening.     ezetimibe (ZETIA) 10 MG tablet TAKE 1 TABLET BY MOUTH EVERY DAY 90 tablet 3   gabapentin (NEURONTIN) 100 MG capsule 3  qhs 270 capsule 1   ketoconazole (NIZORAL) 2 % shampoo Apply 1 application topically 2 (two) times a week. 120 mL 3   latanoprost (XALATAN) 0.005 % ophthalmic solution Place 1 drop into the left eye every other day.     LYCOPENE PO Take 10 mg by mouth every other day.     Magnesium 250 MG TABS Take 250 mg by mouth every 3 (three) days.     metFORMIN (GLUCOPHAGE) 500 MG tablet Take 1 tablet (500 mg total) by mouth 2 (two) times daily with a meal. 60 tablet 2   metoprolol (TOPROL-XL) 50 MG 24 hr tablet Take 1 tablet (50 mg total) by mouth 2 (two) times daily. Take with or immediately following a meal. 30 tablet 0   Omega-3 Fatty Acids (FISH OIL) 1000 MG CAPS Take 1,000 mg by mouth daily with supper.     rosuvastatin (CRESTOR) 20 MG tablet Take 1 tablet (20 mg total) by mouth daily. 90 tablet 3   Sodium Fluoride (PREVIDENT 5000 DRY MOUTH DT) Place 1 application onto teeth See admin instructions. Apply to gums every night for dry mouth     vitamin B-12 (CYANOCOBALAMIN) 500 MCG tablet Take 500 mcg by mouth every other day.     vitamin C (ASCORBIC ACID) 500 MG tablet Take 500-1,000 mg by mouth See admin instructions. Alternate taking 500 mg one day and 1000 mg the next     Vitamin D, Ergocalciferol, (DRISDOL) 1.25  MG (50000 UNIT) CAPS capsule Take 1 capsule (50,000 Units total) by mouth every Saturday. 4 capsule 2   warfarin (COUMADIN) 4 MG tablet TAKE 1/2 TO 1 TABLET DAILY AS DIRECTED BY COUMADIN CLINIC 90 tablet 1   No facility-administered medications prior to visit.     Allergies:  Other and Poison ivy extract   Social History   Socioeconomic History   Marital status: Divorced    Spouse name: Not on file   Number of children: 2   Years of education: 16   Highest education level: Not on file  Occupational History   Occupation: Retired Pharmacist, hospital  Tobacco Use   Smoking status: Former    Packs/day: 0.50    Years: 50.00    Total pack years: 25.00    Types: Cigarettes    Quit date: 12/16/2020    Years since quitting: 1.3   Smokeless tobacco: Never  Vaping Use   Vaping Use: Never used  Substance and Sexual Activity   Alcohol use: No    Alcohol/week: 0.0 standard drinks of alcohol   Drug use: No   Sexual activity: Not Currently  Other Topics Concern   Not on file  Social History Narrative   Lives alone   Divorced   Right-handed   Education: College   No caffeine   Social Determinants of Health   Financial Resource Strain: Not on file  Food Insecurity: Not on file  Transportation Needs: Not on file  Physical Activity: Not on file  Stress: Not on file  Social Connections: Not on file     Family History:  The patient's family history includes Colon cancer in his mother; Hypertension in his father; Ovarian cancer in his mother; Prostate cancer in his father; Uterine cancer in his mother.   ROS:   Please see the history of present illness.   All other systems are reviewed and are negative.   PHYSICAL EXAM:   VS:  BP 134/82   Pulse 77   Ht _0  (1.727 m)   Wt 97.4 kg   SpO2 95%   BMI 32.66 kg/m      General: Alert, oriented x3, no distress, appears well Head: no evidence of trauma, PERRL, EOMI, no exophtalmos or lid lag, no myxedema, no xanthelasma; normal ears, nose  and oropharynx Neck: normal jugular venous pulsations and no hepatojugular reflux; brisk carotid pulses without delay and no carotid bruits Chest: clear to auscultation, no signs of consolidation by percussion or palpation, normal fremitus, symmetrical and full respiratory excursions Cardiovascular: normal position and quality of the apical impulse, irregular rhythm, normal first and second heart sounds, 1-2/6 aortic ejection murmur early peaking, no diastolic murmurs, rubs or gallops Abdomen: no tenderness or distention, no masses by palpation, no abnormal pulsatility or arterial bruits, normal bowel sounds, no hepatosplenomegaly Extremities: no clubbing, cyanosis or edema; 2+ radial, ulnar and brachial pulses bilaterally; 2+ right femoral, posterior tibial and dorsalis pedis pulses; 2+ left femoral, posterior tibial and dorsalis pedis pulses; no subclavian or femoral bruits Neurological: grossly nonfocal Psych: Normal mood and affect    Wt Readings from Last 3 Encounters:  04/27/22 97.4 kg  04/06/22 100.2 kg  02/23/22 101.2 kg      Studies/Labs Reviewed:   ECHO 01/27/2022    1. Severe hypertrophy of the basal septum with otherwise mild concentric  LVH. Inferior and inferoseptal hypokinesis. Left ventricular ejection  fraction, by estimation, is 50 to 55%. The left ventricle has low normal  function. The left ventricle  demonstrates regional wall motion abnormalities (see scoring  diagram/findings for description). There is severe left ventricular  hypertrophy of the basal-septal segment. Left ventricular diastolic  function could not be evaluated.   2. Right ventricular systolic function is normal. The right ventricular  size is normal. There is normal pulmonary artery  systolic pressure.   3. Left atrial size was severely dilated.   4. The mitral valve is degenerative. Mild mitral valve regurgitation. No  evidence of mitral stenosis. Moderate mitral annular calcification.   5.  TAVR gradients unchanged from 02/2021. The aortic valve has been  repaired/replaced. Aortic valve regurgitation is not visualized. No aortic  stenosis is present. Echo findings are consistent with normal structure  and function of the aortic valve  prosthesis. Aortic valve mean gradient measures 7.3 mmHg. Aortic valve  Vmax measures 1.95 m/s.   6. The inferior vena cava is normal in size with greater than 50%  respiratory variability, suggesting right atrial pressure of 3 mmHg.   AV Vmax:           195.00 cm/s  AV VTI:            0.315 m  AV Peak Grad:      15.2 mmHg  AV Mean Grad:      7.3 mmHg  LVOT VTI:          0.221 m  LVOT/AV VTI ratio: 0.70   EKG:  EKG is ordered today and personally reviewed.  It shows atrial fibrillation with controlled ventricular response and a single wide-complex beat which is probably a PVC, LVH with QRS broadening (QRS 116 ms) and left axis deviation with secondary repolarization changes most prominently seen in 1 and aVL.  QTc 457 ms  Recent Labs: 02/23/2022: Hemoglobin 15.4; Platelets 207 K; WBC 14.6 K Hemoglobin A1c 7.3%  Lipid Panel    Component Value Date/Time   CHOL 119 04/26/2020 0941   TRIG 120 04/26/2020 0941   HDL 37 (L) 04/26/2020 0941   CHOLHDL 3.2 04/26/2020 0941   CHOLHDL 5.8 (H) 03/10/2016 0830   VLDL 41 (H) 03/10/2016 0830   LDLCALC 60 04/26/2020 0941    ASSESSMENT:    1. S/P TAVR (transcatheter aortic valve replacement)   2. Persistent atrial fibrillation ([place])   3. Acquired thrombophilia ([place])   4. Chronic diastolic CHF (congestive heart failure) ([place])   5. Coronary artery disease of bypass graft of native heart with stable angina pectoris ([place])   6. Mixed hyperlipidemia   7. Diabetes mellitus type 2 in obese ([place])   8. Essential hypertension   9. Carotid stenosis, bilateral   10. OSA (obstructive sleep apnea)   11. CLL (chronic lymphocytic leukemia) ([place])   12. Mild obesity   13. 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  Recent echo September 2023 with favorable findings.  Needs endocarditis prophylaxis with dental procedures. AFib: Rate controlled, asymptomatic.  This appears to now be a persistent arrhythmia and we have not documented normal rhythm since April 2021.    He is on warfarin anticoagulation without bleeding complications.  He has a severely dilated left atrium which makes it less likely that we will be successful long-term maintenance of normal rhythm even after cardioversion.  Fair ventricular rate control, although not perfect.  On metoprolol. CHADSVasc 4 (age, HTN, CAD, DM).   Warfarin: Usually in therapeutic range, slightly out of range last month or so.  Rechecking today.  No bleeding complications. CHF: Has not required diuretics since the TAVR procedure.  Borderline low LVEF 50% has been stable for several years. CAD s/p CABG: Does not have angina on beta-blockers.  All 3 bypass grafts  remain patent by cardiac catheterization in 2022.  Not on aspirin due to full anticoagulation for atrial fibrillation. HLP: On high-dose rosuvastatin plus ezetimibe.  Recheck lipid profile today.  HDL usually mildly low, will improve if he loses weight probably. DM: Acceptable glycemic control with A1c 7.3% on metformin monotherapy, but not as good as it was a year ago. HTN: Borderline elevated today.  Reminded him of the importance of sodium restriction Smoking: Remains off cigarettes for over a year now.  He really should be congratulated.  He has been sober from alcohol for close to 20 years and now has also managed to quit smoking.  He had recent PFTs which actually showed very good parameters.  No clear evidence that he has a emphysema or other damage from the smoking. Carotid bruits: Carotid ultrasound in September 2021 shows only mild bilateral plaque in internal  carotids.  He has persistent carotid bruits from external carotid disease.  I do not think that he requires routine monitoring. OSA: Intolerant of CPAP, but after weight loss he has no longer complained of daytime hypersomnolence. Obesity: Remains mildly obese, but weight stable and well below his peak weight a couple of years ago. CLL: Relatively low WBC at about 14,000 with out anemia or thrombocytopenia.  Followed by Dr. Alvy Bimler Prostate Ca: PSA barely detectable.  Had initial treatment with radiation therapy several years ago and then last year a few sessions of radiation therapy for recurrent lymphadenopathy in the pelvis.  On Lupron which he tolerates well.    Medication Adjustments/Labs and Tests Ordered: Current medicines are reviewed at length with the patient today.  Concerns regarding medicines are outlined above.  Medication changes, Labs and Tests ordered today are listed in the Patient Instructions below. Patient Instructions  Medication Instructions:  No Changes In Medications at this time.  *If you need a refill on your cardiac medications before your next appointment, please call your pharmacy*  Lab Work: BLOOD WORK TODAY  If you have labs (blood work) drawn today and your tests are completely normal, you will receive your results only by: [place] (if you have MyChart) OR A paper copy in the mail If you have any lab test that is abnormal or we need to change your treatment, we will call you to review the results.  Testing/Procedures: None Ordered At This Time.   Follow-Up: At [facility], you and your health needs are our priority.  As part of our continuing mission to provide you with exceptional heart care, we have created designated Provider Care Teams.  These Care Teams include your primary Cardiologist (physician) and Advanced Practice Providers (APPs -  Physician Assistants and Nurse Practitioners) who all work together to provide you with the care  you need, when you need it.  Your next appointment:   6 month(s)  The format for your next appointment:   In Person  Provider:   Sanda Klein, MD           Signed, Sanda Klein, MD  04/27/2022 8:30 AM    [facility] [place], [address],   [address] Phone: [phone]; Fax: [phone]

## 2022-04-27 NOTE — Patient Instructions (Signed)
Description   Continue taking 1 tablet daily except for 1/2 tablet on Sunday, Tuesday and Friday. Recheck INR in 6 weeks. Coumadin Clinic [phone]

## 2022-04-27 NOTE — Patient Instructions (Signed)
Medication Instructions:  No Changes In Medications at this time.  *If you need a refill on your cardiac medications before your next appointment, please call your pharmacy*  Lab Work: BLOOD WORK TODAY  If you have labs (blood work) drawn today and your tests are completely normal, you will receive your results only by: [place] (if you have MyChart) OR A paper copy in the mail If you have any lab test that is abnormal or we need to change your treatment, we will call you to review the results.  Testing/Procedures: None Ordered At This Time.   Follow-Up: At [facility], you and your health needs are our priority.  As part of our continuing mission to provide you with exceptional heart care, we have created designated Provider Care Teams.  These Care Teams include your primary Cardiologist (physician) and Advanced Practice Providers (APPs -  Physician Assistants and Nurse Practitioners) who all work together to provide you with the care you need, when you need it.  Your next appointment:   6 month(s)  The format for your next appointment:   In Person  Provider:   Sanda Klein, MD

## 2022-04-28 NOTE — Addendum Note (Signed)
Addended by: Orma Render on: 04/28/2022 02:50 PM   Modules accepted: Orders

## 2022-04-30 ENCOUNTER — Encounter: Payer: Self-pay | Admitting: Pulmonary Disease

## 2022-05-01 NOTE — Telephone Encounter (Signed)
Please see EKG sent by pt in mychart message.

## 2022-05-09 ENCOUNTER — Ambulatory Visit (HOSPITAL_COMMUNITY): Payer: 59 | Admitting: Licensed Clinical Social Worker

## 2022-05-10 ENCOUNTER — Telehealth: Payer: Self-pay | Admitting: Nurse Practitioner

## 2022-05-10 NOTE — Telephone Encounter (Signed)
Left message for patient to call back and schedule Medicare Annual Wellness Visit (AWV).   Please offer to do virtually or by telephone.   Last AWV:  04/07/2019   Schedule at any time with [place]   30 minute appointment for Virtual or phone  45 minute appointment for Initial virtual/phone  Any questions, please contact me at [phone]   Thank you,   [facility]  Ambulatory Clinical Support for Care Management [place] Are. We Are. [facility] ??[phone] or ??[phone]

## 2022-05-18 ENCOUNTER — Ambulatory Visit: Payer: Medicare Other

## 2022-05-18 VITALS — Ht 68.0 in | Wt 216.0 lb

## 2022-05-18 DIAGNOSIS — Z0000 Encounter for general adult medical examination without abnormal findings: Secondary | ICD-10-CM | POA: Diagnosis not present

## 2022-05-18 DIAGNOSIS — Z87891 Personal history of nicotine dependence: Secondary | ICD-10-CM

## 2022-05-18 NOTE — Progress Notes (Addendum)
I connected with  Rodney Friedman on 05/23/22 by a audio enabled telemedicine application and verified that I am speaking with the correct person using two identifiers.  Patient Location: Home  Provider Location: Home Office  I discussed the limitations of evaluation and management by telemedicine. The patient expressed understanding and agreed to proceed.  Subjective:   Rodney Friedman is a 76 y.o. male who presents for Medicare Annual/Subsequent preventive examination.  Review of Systems    Defer to PCP Cardiac Risk Factors include: advanced age (>58mn, >>45women);diabetes mellitus;dyslipidemia;hypertension;male gender;obesity (BMI >30kg/m2);smoking/ tobacco exposure     Objective:    Today's Vitals   05/18/22 0757 05/18/22 0804  Weight: 216 lb (98 kg)   Height: '5\' 8"'$  (1.727 m)   PainSc:  0-No pain   Body mass index is 32.84 kg/m.     05/18/2022    8:10 AM 02/08/2021    2:00 PM 02/04/2021    2:32 PM 02/01/2021    2:44 PM 01/24/2021   11:03 PM 01/20/2021    7:22 AM 12/17/2020    4:00 PM  Advanced Directives  Does Patient Have a Medical Advance Directive? Yes Yes Yes Yes Yes Yes Yes  Type of Advance Directive Living will Healthcare Power of [place] will Out of facility DNR (pink MOST or yellow form);Healthcare Power of Attorney  Does patient want to make changes to medical advance directive? No - Patient declined No - Patient declined No - Patient declined  No - Patient declined  No - Patient declined  Copy of [place] Chart? Yes - validated most recent copy scanned in chart (See row information) Yes - validated most recent copy scanned in chart (See row information) No - copy requested  No - copy requested No - copy requested   Pre-existing out of facility DNR order (yellow form or pink MOST form)        Physician notified to receive inpatient order    Current Medications (verified) Outpatient Encounter Medications as of 05/18/2022  Medication Sig   albuterol (VENTOLIN HFA) 108 (90 Base) MCG/ACT inhaler Inhale 2 puffs into the lungs every 6 (six) hours as needed for wheezing or shortness of breath.   amoxicillin (AMOXIL) 500 MG tablet Take 4 tablets (2000 mg) by mouth ONE HOUR before any dental procedures.   B Complex Vitamins (VITAMIN-B COMPLEX PO) Take 1 tablet by mouth daily with supper.   BIOTIN PO Take 1 tablet by mouth every evening.   buPROPion (WELLBUTRIN SR) 200 MG 12 hr tablet Take 1 tablet (200 mg total) by mouth every morning.   buPROPion ER (WELLBUTRIN SR) 100 MG 12 hr tablet 1 qhs   Calcium Carbonate-Vit D-Min (CALCIUM 1200 PO) Take 1,200 mg by mouth every evening.   calcium elemental as carbonate (TUMS ULTRA 1000) 400 MG chewable tablet Chew 1,000 mg by mouth daily as needed for heartburn.   docusate sodium (COLACE) 100 MG capsule Take 100 mg by mouth every evening.   ezetimibe (ZETIA) 10 MG tablet TAKE 1 TABLET BY MOUTH EVERY DAY   gabapentin (NEURONTIN) 100 MG capsule 3  qhs   ketoconazole (NIZORAL) 2 % shampoo Apply 1 application topically 2 (two) times a week.   latanoprost (XALATAN) 0.005 % ophthalmic solution Place 1 drop into the left eye every other day.   LYCOPENE PO Take 10 mg by mouth every other day.  Magnesium 250 MG TABS Take 250 mg by mouth every 3 (three) days.   metFORMIN (GLUCOPHAGE) 500 MG tablet Take 1 tablet (500 mg total) by mouth 2 (two) times daily with a meal.   metoprolol (TOPROL-XL) 50 MG 24 hr tablet Take 1 tablet (50 mg total) by mouth 2 (two) times daily. Take with or immediately following a meal.   Omega-3 Fatty Acids (FISH OIL) 1000 MG CAPS Take 1,000 mg by mouth daily with supper.   rosuvastatin (CRESTOR) 20 MG tablet Take 1 tablet (20 mg total) by mouth daily.   Sodium Fluoride (PREVIDENT 5000 DRY MOUTH DT) Place 1 application onto teeth  See admin instructions. Apply to gums every night for dry mouth   vitamin B-12 (CYANOCOBALAMIN) 500 MCG tablet Take 500 mcg by mouth every other day.   vitamin C (ASCORBIC ACID) 500 MG tablet Take 500-1,000 mg by mouth See admin instructions. Alternate taking 500 mg one day and 1000 mg the next   Vitamin D, Ergocalciferol, (DRISDOL) 1.25 MG (50000 UNIT) CAPS capsule Take 1 capsule (50,000 Units total) by mouth every Saturday.   warfarin (COUMADIN) 4 MG tablet TAKE 1/2 TO 1 TABLET DAILY AS DIRECTED BY COUMADIN CLINIC   No facility-administered encounter medications on file as of 05/18/2022.    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 MAngelena Form& Dr. 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   Past Surgical History:  Procedure Laterality Date   Arm Surgery Right    from door accident  with glass   CARDIAC CATHETERIZATION     CATARACT EXTRACTION W/ INTRAOCULAR LENS IMPLANT Bilateral    COLONOSCOPY     CORONARY ARTERY BYPASS GRAFT  08/04/2004   EYE SURGERY     POLYPECTOMY     RIGHT/LEFT HEART CATH AND CORONARY/GRAFT ANGIOGRAPHY N/A 01/20/2021   Procedure: RIGHT/LEFT HEART CATH AND CORONARY/GRAFT ANGIOGRAPHY;  Surgeon: MBurnell Blanks MD;  [facility];  Service: Cardiovascular;  Laterality: N/A;   TEE WITHOUT CARDIOVERSION N/A 02/08/2021   Procedure: TRANSESOPHAGEAL ECHOCARDIOGRAM (TEE);  Surgeon: MBurnell Blanks MD;  [facility];  Service: Open Heart  Surgery;  Laterality: N/A;   TRANSCATHETER AORTIC VALVE REPLACEMENT, CAROTID Left 02/08/2021   Procedure: TRANSCATHETER AORTIC VALVE REPLACEMENT, LEFT CAROTID;  Surgeon: Burnell Blanks, MD;  Location: [facility];  Service: Open Heart Surgery;  Laterality: Left;   Family History  Problem Relation Age of Onset   Colon cancer Mother    Ovarian cancer Mother    Uterine cancer Mother    Hypertension Father    Prostate cancer Father    Colon polyps Neg Hx    Social History   Socioeconomic History   Marital status: Divorced    Spouse name: Not on file   Number of children: 2   Years of education: 4   Highest education level: Not on file  Occupational History   Occupation: Retired Pharmacist, hospital  Tobacco Use   Smoking status: Former    Packs/day: 0.50    Years: 50.00    Total pack years: 25.00    Types: Cigarettes    Quit date: 12/16/2020    Years since quitting: 1.4   Smokeless tobacco: Never  Vaping Use   Vaping Use: Never used  Substance and Sexual Activity   Alcohol use: No    Alcohol/week: 0.0 standard drinks of alcohol   Drug use: No   Sexual activity: Not Currently  Other Topics Concern   Not on file  Social History Narrative   Lives alone   Divorced   Right-handed   Education: College   No caffeine   Social Determinants of Health    Financial Resource Strain: Low Risk  (05/18/2022)   Overall Financial Resource Strain (CARDIA)    Difficulty of Paying Living Expenses: Not hard at all  Food Insecurity: No Food Insecurity (05/18/2022)   Hunger Vital Sign    Worried About Running Out of Food in the Last Year: Never true    [place] in the Last Year: Never true  Transportation Needs: No Transportation Needs (05/18/2022)   PRAPARE - Hydrologist (Medical): No    Lack of Transportation (Non-Medical): No  Physical Activity: Insufficiently Active (05/18/2022)   Exercise Vital Sign    Days of Exercise per Week: 5 days    Minutes of Exercise per Session: 20 min  Stress: Stress Concern Present (05/18/2022)   [place]    Feeling of Stress : To some extent  Social Connections: Moderately Integrated (05/18/2022)   Social Connection and Isolation Panel [NHANES]    Frequency of Communication with Friends and Family: More than three times a week    Frequency of Social Gatherings with Friends and Family: More than three times a week    Attends Religious Services: More than 4 times per year    Active Member of [employer] or Organizations: Yes    Attends Music therapist: More than 4 times per year    Marital Status: Divorced    Tobacco Counseling Counseling given: Not Answered   Clinical Intake:  Pre-visit preparation completed: Yes  Pain : No/denies pain Pain Score: 0-No pain     Nutritional Risks: None Diabetes: Yes CBG done?: No Did pt. bring in CBG monitor from home?: No  How often do you need to have someone help you when you read instructions, pamphlets, or other written materials from your doctor or pharmacy?: 1 - Never What is the last grade level you completed in school?: masters  Diabetic?yes Nutrition Risk Assessment:  Has the  patient had any N/V/D within the last 2 months?  No  Does the  patient have any non-healing wounds?  No  Has the patient had any unintentional weight loss or weight gain?  No   Diabetes:  Is the patient diabetic?  Yes  If diabetic, was a CBG obtained today?  No  Did the patient bring in their glucometer from home?  No  How often do you monitor your CBG's? never.   Financial Strains and Diabetes Management:  Are you having any financial strains with the device, your supplies or your medication? No .  Does the patient want to be seen by Chronic Care Management for management of their diabetes?  No  Would the patient like to be referred to a Nutritionist or for Diabetic Management?  No   Diabetic Exams:  Diabetic Eye Exam: Completed 07/2020 Diabetic Foot Exam: Overdue, Pt has been advised about the importance in completing this exam. Pt is scheduled for diabetic foot exam on next appt.   Interpreter Needed?: No  Information entered by :: nikki m ,cma   Activities of Daily Living    05/18/2022    8:11 AM  In your present state of health, do you have any difficulty performing the following activities:  Hearing? 0  Vision? 0  Difficulty concentrating or making decisions? 0  Walking or climbing stairs? 0  Dressing or bathing? 0  Doing errands, shopping? 0  Preparing Food and eating ? N  Using the Toilet? N  In the past six months, have you accidently leaked urine? N  Do you have problems with loss of bowel control? N  Managing your Medications? N  Managing your Finances? N  Housekeeping or managing your Housekeeping? N    Patient Care Team: Fenton Foy, NP as PCP - General (General Practice) Croitoru, Dani Gobble, MD as PCP - Cardiology (Cardiology) Kathie Rhodes, MD (Inactive) as Consulting Physician (Urology) Sanda Klein, MD as Consulting Physician (Cardiology) Gaye Pollack, MD as Consulting Physician (Cardiothoracic Surgery)  Indicate any recent Medical Services you may have received from other than [facility] providers in the  past year (date may be approximate).     Assessment:   This is a routine wellness examination for [facility].  Hearing/Vision screen No results found.  Dietary issues and exercise activities discussed: Current Exercise Habits: Home exercise routine, Type of exercise: walking, Time (Minutes): 20, Frequency (Times/Week): 5, Weekly Exercise (Minutes/Week): 100, Intensity: Mild, Exercise limited by: None identified   Goals Addressed   None   Depression Screen    05/18/2022    8:08 AM 02/23/2022    9:32 AM 07/14/2020    5:13 PM 04/26/2020    8:02 AM 10/27/2019    8:02 AM 04/30/2019    9:17 AM 04/07/2019    8:10 AM  PHQ 2/9 Scores  PHQ - 2 Score 1 0  3 6 0 4  PHQ- 9 Score  '1  10 13  4     '$ Information is confidential and restricted. Go to Review Flowsheets to unlock data.    Fall Risk    05/18/2022    8:11 AM 02/23/2022    9:32 AM 04/26/2020    8:02 AM 10/27/2019    8:02 AM 04/30/2019    9:17 AM  Fall Risk   Falls in the past year? 1 0 0 0 1  Number falls in past yr: 1 0   0  Injury with Fall? 0 0   0  Risk for fall  due to : History of fall(s) No Fall Risks     Follow up Falls evaluation completed;Education provided;Falls prevention discussed Falls evaluation completed Falls evaluation completed Falls evaluation completed Falls evaluation completed    FALL RISK PREVENTION PERTAINING TO THE HOME:  Any stairs in or around the home? Yes  If so, are there any without handrails? Yes  Home free of loose throw rugs in walkways, pet beds, electrical cords, etc? Yes  Adequate lighting in your home to reduce risk of falls? Yes   ASSISTIVE DEVICES UTILIZED TO PREVENT FALLS:  Life alert? No  Use of a cane, walker or w/c? No  Grab bars in the bathroom? Yes  Shower chair or bench in shower? Yes  Elevated toilet seat or a handicapped toilet? No   TIMED UP AND GO:  Was the test performed? No .  Length of time to ambulate 10 feet: n/a sec.     Cognitive Function:        05/18/2022     8:12 AM 04/07/2019    8:06 AM 05/03/2018    8:17 AM 02/08/2017    8:47 AM  6CIT Screen  What Year? 0 points 0 points 0 points 0 points  What month? 0 points 0 points 0 points 0 points  What time? 0 points 0 points 0 points 0 points  Count back from 20 0 points 0 points 0 points 0 points  Months in reverse 0 points 0 points 0 points 0 points  Repeat phrase 0 points 0 points 0 points 0 points  Total Score 0 points 0 points 0 points 0 points    Immunizations Immunization History  Administered Date(s) Administered   Fluad Quad(high Dose 65+) 01/22/2019   Influenza Split 02/17/2015   Influenza, High Dose Seasonal PF 02/15/2018, 01/22/2020, 01/23/2022   Influenza, Quadrivalent, Recombinant, Inj, Pf 01/22/2019   Influenza,inj,Quad PF,6+ Mos 02/11/2016, 02/08/2017   Influenza-Unspecified 02/17/2015, 01/22/2019, 01/23/2022   Moderna Sars-Covid-2 Vaccination 08/20/2019, 09/17/2019, 03/25/2020   Pneumococcal Conjugate-13 02/24/2014   Pneumococcal Polysaccharide-23 09/08/2015   Tdap 09/08/2015   Unspecified SARS-COV-2 Vaccination 08/20/2019   Zoster Recombinat (Shingrix) 02/23/2022, 04/26/2022   Zoster, Live 05/22/2013    TDAP status: Up to date  Flu Vaccine status: Up to date  Pneumococcal vaccine status: Up to date  Covid-19 vaccine status: Information provided on how to obtain vaccines.   Qualifies for Shingles Vaccine? Yes   Zostavax completed Yes   Shingrix Completed?: Yes  Screening Tests Health Maintenance  Topic Date Due   FOOT EXAM  04/29/2020   COVID-19 Vaccine (5 - 2023-24 season) 01/20/2022   HEMOGLOBIN A1C  08/25/2022   COLONOSCOPY (Pts 45-58yr Insurance coverage will need to be confirmed)  09/26/2022   OPHTHALMOLOGY EXAM  01/11/2023   DTaP/Tdap/Td (2 - Td or Tdap) 09/07/2025   Pneumonia Vaccine 74 Years old  Completed   INFLUENZA VACCINE  Completed   Hepatitis C Screening  Completed   Zoster Vaccines- Shingrix  Completed   HPV VACCINES  Aged Out     Health Maintenance  Health Maintenance Due  Topic Date Due   FOOT EXAM  04/29/2020   COVID-19 Vaccine (5 - 2023-24 season) 01/20/2022    Colorectal cancer screening: Type of screening: Colonoscopy. Completed 10/05/17. Repeat every 5 years  Lung Cancer Screening: (Low Dose CT Chest recommended if Age 74-80years, 30 pack-year currently smoking OR have quit w/in 15years.) does qualify.   Lung Cancer Screening Referral: completed  Additional Screening:  Hepatitis C Screening: does qualify;  Completed 09/08/2015  Vision Screening: Recommended annual ophthalmology exams for early detection of glaucoma and other disorders of the eye. Is the patient up to date with their annual eye exam?  Yes  Who is the provider or what is the name of the office in which the patient attends annual eye exams? Dr Brigitte Pulse If pt is not established with a provider, would they like to be referred to a provider to establish care? No .   Dental Screening: Recommended annual dental exams for proper oral hygiene  Community Resource Referral / Chronic Care Management: CRR required this visit?  No   CCM required this visit?  No      Plan:     I have personally reviewed and noted the following in the patient's chart:   Medical and social history Use of alcohol, tobacco or illicit drugs  Current medications and supplements including opioid prescriptions. Patient is not currently taking opioid prescriptions. Functional ability and status Nutritional status Physical activity Advanced directives List of other physicians Hospitalizations, surgeries, and ER visits in previous 12 months Vitals Screenings to include cognitive, depression, and falls Referrals and appointments  In addition, I have reviewed and discussed with patient certain preventive protocols, quality metrics, and best practice recommendations. A written personalized care plan for preventive services as well as general preventive health  recommendations were provided to patient.     Angus Seller, CMA   05/18/2022   Nurse Notes: Non face to face minutes spent with patient 35. Patient does not check his FBS at home but keeps a check on it with his A1C. He has routine blood draws due to lymphocytic leukemia.   I have reviewed and agree with above annual wellness documentation  Attestation signed by Fenton Foy at 06/07/22

## 2022-05-18 NOTE — Patient Instructions (Signed)
[name] , Thank you for taking time to come for your Medicare Wellness Visit. I appreciate your ongoing commitment to your health goals. Please review the following plan we discussed and let me know if I can assist you in the future.   These are the goals we discussed:  Goals      Patient Stated     Continue to reduce sugar intake.      Reduce sugar intake daily     Patient states that he is reducing his sugar intake daily.         This is a list of the screening recommended for you and due dates:  Health Maintenance  Topic Date Due   Complete foot exam   04/29/2020   COVID-19 Vaccine (5 - 2023-24 season) 01/20/2022   Hemoglobin A1C  08/25/2022   Colon Cancer Screening  09/26/2022   Eye exam for diabetics  01/11/2023   DTaP/Tdap/Td vaccine (2 - Td or Tdap) 09/07/2025   Pneumonia Vaccine  Completed   Flu Shot  Completed   Hepatitis C Screening: USPSTF Recommendation to screen - Ages 8-79 yo.  Completed   Zoster (Shingles) Vaccine  Completed   HPV Vaccine  Aged Out    Advanced directives: Located in Chart  Conditions/risks identified: fall risks, he does have rugs but he uses rug tape to hold in place.   Next appointment: Follow up in one year for your annual wellness visit. 05/21/2023 @ 2  Preventive Care 65 Years and Older, Male  Preventive care refers to lifestyle choices and visits with your health care provider that can promote health and wellness. What does preventive care include? A yearly physical exam. This is also called an annual well check. Dental exams once or twice a year. Routine eye exams. Ask your health care provider how often you should have your eyes checked. Personal lifestyle choices, including: Daily care of your teeth and gums. Regular physical activity. Eating a healthy diet. Avoiding tobacco and drug use. Limiting alcohol use. Practicing safe sex. Taking low doses of aspirin every day. Taking vitamin and mineral supplements as  recommended by your health care provider. What happens during an annual well check? The services and screenings done by your health care provider during your annual well check will depend on your age, overall health, lifestyle risk factors, and family history of disease. Counseling  Your health care provider may ask you questions about your: Alcohol use. Tobacco use. Drug use. Emotional well-being. Home and relationship well-being. Sexual activity. Eating habits. History of falls. Memory and ability to understand (cognition). Work and work Statistician. Screening  You may have the following tests or measurements: Height, weight, and BMI. Blood pressure. Lipid and cholesterol levels. These may be checked every 5 years, or more frequently if you are over 55 years old. Skin check. Lung cancer screening. You may have this screening every year starting at age 67 if you have a 30-pack-year history of smoking and currently smoke or have quit within the past 15 years. Fecal occult blood test (FOBT) of the stool. You may have this test every year starting at age 81. Flexible sigmoidoscopy or colonoscopy. You may have a sigmoidoscopy every 5 years or a colonoscopy every 10 years starting at age 50. Prostate cancer screening. Recommendations will vary depending on your family history and other risks. Hepatitis C blood test. Hepatitis B blood test. Sexually transmitted disease (STD) testing. Diabetes screening. This is done by checking your blood sugar (glucose) after you  have not eaten for a while (fasting). You may have this done every 1-3 years. Abdominal aortic aneurysm (AAA) screening. You may need this if you are a current or former smoker. Osteoporosis. You may be screened starting at age 67 if you are at high risk. Talk with your health care provider about your test results, treatment options, and if necessary, the need for more tests. Vaccines  Your health care provider may recommend  certain vaccines, such as: Influenza vaccine. This is recommended every year. Tetanus, diphtheria, and acellular pertussis (Tdap, Td) vaccine. You may need a Td booster every 10 years. Zoster vaccine. You may need this after age 85. Pneumococcal 13-valent conjugate (PCV13) vaccine. One dose is recommended after age 49. Pneumococcal polysaccharide (PPSV23) vaccine. One dose is recommended after age 60. Talk to your health care provider about which screenings and vaccines you need and how often you need them. This information is not intended to replace advice given to you by your health care provider. Make sure you discuss any questions you have with your health care provider. Document Released: 06/04/2015 Document Revised: 01/26/2016 Document Reviewed: 03/09/2015 Elsevier Interactive Patient Education  2017 [place] Prevention in the Home Falls can cause injuries. They can happen to people of all ages. There are many things you can do to make your home safe and to help prevent falls. What can I do on the outside of my home? Regularly fix the edges of walkways and driveways and fix any cracks. Remove anything that might make you trip as you walk through a door, such as a raised step or threshold. Trim any bushes or trees on the path to your home. Use bright outdoor lighting. Clear any walking paths of anything that might make someone trip, such as rocks or tools. Regularly check to see if handrails are loose or broken. Make sure that both sides of any steps have handrails. Any raised decks and porches should have guardrails on the edges. Have any leaves, snow, or ice cleared regularly. Use sand or salt on walking paths during winter. Clean up any spills in your garage right away. This includes oil or grease spills. What can I do in the bathroom? Use night lights. Install grab bars by the toilet and in the tub and shower. Do not use towel bars as grab bars. Use non-skid mats or  decals in the tub or shower. If you need to sit down in the shower, use a plastic, non-slip stool. Keep the floor dry. Clean up any water that spills on the floor as soon as it happens. Remove soap buildup in the tub or shower regularly. Attach bath mats securely with double-sided non-slip rug tape. Do not have throw rugs and other things on the floor that can make you trip. What can I do in the bedroom? Use night lights. Make sure that you have a light by your bed that is easy to reach. Do not use any sheets or blankets that are too big for your bed. They should not hang down onto the floor. Have a firm chair that has side arms. You can use this for support while you get dressed. Do not have throw rugs and other things on the floor that can make you trip. What can I do in the kitchen? Clean up any spills right away. Avoid walking on wet floors. Keep items that you use a lot in easy-to-reach places. If you need to reach something above you, use a strong step  stool that has a grab bar. Keep electrical cords out of the way. Do not use floor polish or wax that makes floors slippery. If you must use wax, use non-skid floor wax. Do not have throw rugs and other things on the floor that can make you trip. What can I do with my stairs? Do not leave any items on the stairs. Make sure that there are handrails on both sides of the stairs and use them. Fix handrails that are broken or loose. Make sure that handrails are as long as the stairways. Check any carpeting to make sure that it is firmly attached to the stairs. Fix any carpet that is loose or worn. Avoid having throw rugs at the top or bottom of the stairs. If you do have throw rugs, attach them to the floor with carpet tape. Make sure that you have a light switch at the top of the stairs and the bottom of the stairs. If you do not have them, ask someone to add them for you. What else can I do to help prevent falls? Wear shoes that: Do not  have high heels. Have rubber bottoms. Are comfortable and fit you well. Are closed at the toe. Do not wear sandals. If you use a stepladder: Make sure that it is fully opened. Do not climb a closed stepladder. Make sure that both sides of the stepladder are locked into place. Ask someone to hold it for you, if possible. Clearly mark and make sure that you can see: Any grab bars or handrails. First and last steps. Where the edge of each step is. Use tools that help you move around (mobility aids) if they are needed. These include: Canes. Walkers. Scooters. Crutches. Turn on the lights when you go into a dark area. Replace any light bulbs as soon as they burn out. Set up your furniture so you have a clear path. Avoid moving your furniture around. If any of your floors are uneven, fix them. If there are any pets around you, be aware of where they are. Review your medicines with your doctor. Some medicines can make you feel dizzy. This can increase your chance of falling. Ask your doctor what other things that you can do to help prevent falls. This information is not intended to replace advice given to you by your health care provider. Make sure you discuss any questions you have with your health care provider. Document Released: 03/04/2009 Document Revised: 10/14/2015 Document Reviewed: 06/12/2014 Elsevier Interactive Patient Education  2017 [employer].

## 2022-05-25 ENCOUNTER — Other Ambulatory Visit: Payer: Self-pay | Admitting: Cardiovascular Disease

## 2022-05-25 DIAGNOSIS — E782 Mixed hyperlipidemia: Secondary | ICD-10-CM

## 2022-05-26 ENCOUNTER — Ambulatory Visit (INDEPENDENT_AMBULATORY_CARE_PROVIDER_SITE_OTHER): Payer: Medicare Other | Admitting: Nurse Practitioner

## 2022-05-26 ENCOUNTER — Encounter: Payer: Self-pay | Admitting: Nurse Practitioner

## 2022-05-26 VITALS — BP 144/93 | HR 70 | Ht 68.0 in | Wt 218.0 lb

## 2022-05-26 DIAGNOSIS — Z87891 Personal history of nicotine dependence: Secondary | ICD-10-CM

## 2022-05-26 DIAGNOSIS — Z122 Encounter for screening for malignant neoplasm of respiratory organs: Secondary | ICD-10-CM

## 2022-05-26 DIAGNOSIS — E119 Type 2 diabetes mellitus without complications: Secondary | ICD-10-CM | POA: Diagnosis not present

## 2022-05-26 DIAGNOSIS — E782 Mixed hyperlipidemia: Secondary | ICD-10-CM | POA: Diagnosis not present

## 2022-05-26 LAB — POCT GLYCOSYLATED HEMOGLOBIN (HGB A1C): Hemoglobin A1C: 7.2 % — AB (ref 4.0–5.6)

## 2022-05-26 NOTE — Progress Notes (Signed)
$'@Patient'y$  ID: Rodney Friedman, male    DOB: Nov 05, 1945, 76 y.o.   MRN: [id number]  Chief Complaint  Patient presents with   Follow-up    Referring provider: Kristie Cowman, MD   HPI  77 year old male with history of atrial fibrillation, hypertensive heart disease CAD, OSA, diabetes, tremor, central serous chorioretinopathy,, prostate cancer metastatic to intrapelvic lymph node, glaucoma, hyperlipidemia, cataracts.  Patient presents today for routine follow-up visit.  Overall he has been stable since his last visit here.  Patient states that he does need an updated lung cancer screening.  Will place referral for this today.  Patient does need blood work today.  Patient is up-to-date on eye exams and colonoscopy.  Patient does have a history of smoking but no longer smokes. Denies f/c/s, n/v/d, hemoptysis, PND, leg swelling Denies chest pain or edema       Allergies  Allergen Reactions   Other Rash    Allergen: "Plants and bushes while doing yard workDatabase administrator Extract Rash    Immunization History  Administered Date(s) Administered   Fluad Quad(high Dose 65+) 01/22/2019   Influenza Split 02/17/2015   Influenza, High Dose Seasonal PF 02/15/2018, 01/22/2020, 01/23/2022   Influenza, Quadrivalent, Recombinant, Inj, Pf 01/22/2019   Influenza,inj,Quad PF,6+ Mos 02/11/2016, 02/08/2017   Influenza-Unspecified 02/17/2015, 01/22/2019, 01/23/2022   Moderna Sars-Covid-2 Vaccination 08/20/2019, 09/17/2019, 03/25/2020   Pneumococcal Conjugate-13 02/24/2014   Pneumococcal Polysaccharide-23 09/08/2015   Tdap 09/08/2015   Unspecified SARS-COV-2 Vaccination 08/20/2019   Zoster Recombinat (Shingrix) 02/23/2022, 04/26/2022   Zoster, Live 05/22/2013    Past Medical History:  Diagnosis Date   Adenomatous colon polyp    Alcoholism ([place])    Anxiety    Atrial fibrillation ([place])    CAD (coronary artery disease)    Cataract    removed left eye    CHF (congestive heart failure) ([place])  12/16/2020   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 MAngelena Form& Dr. BCyndia Bent  Sleep apnea    Substance abuse ([place]    Tremor, essential 09/22/2015    Tobacco History: Social History   Tobacco Use  Smoking Status Former   Packs/day: 0.50   Years: 50.00   Total pack years: 25.00   Types: Cigarettes   Quit date: 12/16/2020   Years since quitting: 1.4  Smokeless Tobacco Never   Counseling given: Not Answered   Outpatient Encounter Medications as of 05/26/2022  Medication Sig   albuterol (VENTOLIN HFA) 108 (90 Base) MCG/ACT inhaler Inhale 2 puffs into the lungs every 6 (six) hours as needed for wheezing  or shortness of breath.   B Complex Vitamins (VITAMIN-B COMPLEX PO) Take 1 tablet by mouth daily with supper.   BIOTIN PO Take 1 tablet by mouth every evening.   buPROPion (WELLBUTRIN SR) 200 MG 12 hr tablet Take 1 tablet (200 mg total) by mouth every morning.   buPROPion ER (WELLBUTRIN SR) 100 MG 12 hr tablet 1 qhs   Calcium Carbonate-Vit D-Min (CALCIUM 1200 PO) Take 1,200 mg by mouth every evening.   docusate sodium (COLACE) 100 MG capsule Take 100 mg by mouth every evening.   ezetimibe (ZETIA) 10 MG tablet TAKE 1  TABLET BY MOUTH EVERY DAY   gabapentin (NEURONTIN) 100 MG capsule 3  qhs   ketoconazole (NIZORAL) 2 % shampoo Apply 1 application topically 2 (two) times a week.   latanoprost (XALATAN) 0.005 % ophthalmic solution Place 1 drop into the left eye every other day.   LYCOPENE PO Take 10 mg by mouth every other day.   Magnesium 250 MG TABS Take 250 mg by mouth every 3 (three) days.   metoprolol (TOPROL-XL) 50 MG 24 hr tablet Take 1 tablet (50 mg total) by mouth 2 (two) times daily. Take with or immediately following a meal.   Omega-3 Fatty Acids (FISH OIL) 1000 MG CAPS Take 1,000 mg by mouth daily with supper.   rosuvastatin (CRESTOR) 20 MG tablet TAKE 1 TABLET BY MOUTH EVERY DAY   Sodium Fluoride (PREVIDENT 5000 DRY MOUTH DT) Place 1 application onto teeth See admin instructions. Apply to gums every night for dry mouth   vitamin B-12 (CYANOCOBALAMIN) 500 MCG tablet Take 500 mcg by mouth every other day.   vitamin C (ASCORBIC ACID) 500 MG tablet Take 500-1,000 mg by mouth See admin instructions. Alternate taking 500 mg one day and 1000 mg the next   Vitamin D, Ergocalciferol, (DRISDOL) 1.25 MG (50000 UNIT) CAPS capsule Take 1 capsule (50,000 Units total) by mouth every Saturday.   warfarin (COUMADIN) 4 MG tablet TAKE 1/2 TO 1 TABLET DAILY AS DIRECTED BY COUMADIN CLINIC   amoxicillin (AMOXIL) 500 MG tablet Take 4 tablets (2000 mg) by mouth ONE HOUR before any dental procedures. (Patient not taking: Reported on 05/26/2022)   calcium elemental as carbonate (TUMS ULTRA 1000) 400 MG chewable tablet Chew 1,000 mg by mouth daily as needed for heartburn.   metFORMIN (GLUCOPHAGE) 500 MG tablet Take 1 tablet (500 mg total) by mouth 2 (two) times daily with a meal.   No facility-administered encounter medications on file as of 05/26/2022.     Review of Systems  Review of Systems  Constitutional: Negative.   HENT: Negative.    Cardiovascular: Negative.   Gastrointestinal: Negative.   Allergic/Immunologic:  Negative.   Neurological: Negative.   Psychiatric/Behavioral: Negative.         Physical Exam  BP (!) 144/93   Pulse 70   Ht '5\' 8"'$  (1.727 m)   Wt 218 lb (98.9 kg)   SpO2 98%   BMI 33.15 kg/m   Wt Readings from Last 5 Encounters:  05/26/22 218 lb (98.9 kg)  05/18/22 216 lb (98 kg)  04/27/22 214 lb 12.8 oz (97.4 kg)  04/06/22 221 lb (100.2 kg)  02/23/22 223 lb (101.2 kg)     Physical Exam Vitals and nursing note reviewed.  Constitutional:      General: He is not in acute distress.    Appearance: He is well-developed.  Cardiovascular:     Rate and Rhythm: Normal rate and regular rhythm.  Pulmonary:  Effort: Pulmonary effort is normal.     Breath sounds: Normal breath sounds.  Skin:    General: Skin is warm and dry.  Neurological:     Mental Status: He is alert and oriented to person, place, and time.      Assessment & Plan:   Diabetes mellitus ([place]) - POCT glycosylated hemoglobin (Hb A1C) - CBC - Comprehensive metabolic panel - Lipid Panel  2. Mixed hyperlipidemia  - Lipid Panel  3. History of smoking  - Ambulatory Referral Lung Cancer Screening [facility]  4. Screening for lung cancer  - Ambulatory Referral Lung Cancer Screening [facility]  Follow up:  Follow up in 3 months     Fenton Foy, NP 05/26/2022

## 2022-05-26 NOTE — Patient Instructions (Addendum)
1. Type 2 diabetes mellitus without complication, without long-term current use of insulin (HCC)  - POCT glycosylated hemoglobin (Hb A1C) - CBC - Comprehensive metabolic panel - Lipid Panel  2. Mixed hyperlipidemia  - Lipid Panel  3. History of smoking  - Ambulatory Referral Lung Cancer Screening [facility]  4. Screening for lung cancer  - Ambulatory Referral Lung Cancer Screening [facility]  Follow up:  Follow up in 3 months

## 2022-05-26 NOTE — Assessment & Plan Note (Signed)
-   POCT glycosylated hemoglobin (Hb A1C) - CBC - Comprehensive metabolic panel - Lipid Panel  2. Mixed hyperlipidemia  - Lipid Panel  3. History of smoking  - Ambulatory Referral Lung Cancer Screening [facility]  4. Screening for lung cancer  - Ambulatory Referral Lung Cancer Screening [facility]  Follow up:  Follow up in 3 months

## 2022-05-27 ENCOUNTER — Encounter: Payer: Self-pay | Admitting: Hematology and Oncology

## 2022-05-27 LAB — CBC
Hematocrit: 48.7 % (ref 37.5–51.0)
Hemoglobin: 16 g/dL (ref 13.0–17.7)
MCH: 30.5 pg (ref 26.6–33.0)
MCHC: 32.9 g/dL (ref 31.5–35.7)
MCV: 93 fL (ref 79–97)
Platelets: 196 10*3/uL (ref 150–450)
RBC: 5.25 x10E6/uL (ref 4.14–5.80)
RDW: 13.7 % (ref 11.6–15.4)
WBC: 11.7 10*3/uL — ABNORMAL HIGH (ref 3.4–10.8)

## 2022-05-27 LAB — COMPREHENSIVE METABOLIC PANEL
ALT: 32 IU/L (ref 0–44)
AST: 35 IU/L (ref 0–40)
Albumin/Globulin Ratio: 1.9 (ref 1.2–2.2)
Albumin: 4.3 g/dL (ref 3.8–4.8)
Alkaline Phosphatase: 46 IU/L (ref 44–121)
BUN/Creatinine Ratio: 14 (ref 10–24)
BUN: 16 mg/dL (ref 8–27)
Bilirubin Total: 0.4 mg/dL (ref 0.0–1.2)
CO2: 23 mmol/L (ref 20–29)
Calcium: 10 mg/dL (ref 8.6–10.2)
Chloride: 105 mmol/L (ref 96–106)
Creatinine, Ser: 1.11 mg/dL (ref 0.76–1.27)
Globulin, Total: 2.3 g/dL (ref 1.5–4.5)
Glucose: 135 mg/dL — ABNORMAL HIGH (ref 70–99)
Potassium: 4.8 mmol/L (ref 3.5–5.2)
Sodium: 144 mmol/L (ref 134–144)
Total Protein: 6.6 g/dL (ref 6.0–8.5)
eGFR: 70 mL/min/{1.73_m2} (ref >59)

## 2022-05-27 LAB — LIPID PANEL
Chol/HDL Ratio: 2.9 ratio (ref 0.0–5.0)
Cholesterol, Total: 99 mg/dL — ABNORMAL LOW (ref 100–199)
HDL: 34 mg/dL — ABNORMAL LOW (ref >39)
LDL Chol Calc (NIH): 42 mg/dL (ref 0–99)
Triglycerides: 129 mg/dL (ref 0–149)
VLDL Cholesterol Cal: 23 mg/dL (ref 5–40)

## 2022-05-30 ENCOUNTER — Encounter (HOSPITAL_COMMUNITY): Payer: Self-pay | Admitting: Licensed Clinical Social Worker

## 2022-05-30 ENCOUNTER — Ambulatory Visit (INDEPENDENT_AMBULATORY_CARE_PROVIDER_SITE_OTHER): Payer: Medicare Other | Admitting: Licensed Clinical Social Worker

## 2022-05-30 DIAGNOSIS — F339 Major depressive disorder, recurrent, unspecified: Secondary | ICD-10-CM | POA: Diagnosis not present

## 2022-05-30 NOTE — Progress Notes (Signed)
   THERAPIST PROGRESS NOTE  Session Time: 9:00am-9:50am  Participation Level: Active  Behavioral Response: NeatAlertAnxious and Depressed  Type of Therapy: Individual Therapy  Treatment Goals addressed:  "to cope with stress and manage depression and anxiety". Gonzalo will report improved stress management and coping skills, resulting in mood stability and less worrying 4 out of 7 days.    ProgressTowards Goals: Progressing  Interventions: CBT  Summary: Wilhelm Ganaway is a 77 y.o. male who presents with MDD, recurrent, chronic.   Suicidal/Homicidal: Nowithout intent/plan  Therapist Response: Luccas engaged well in individual session with Pension scheme manager. Clinician processed updates in thoughts and feelings. Clinician discussed relationships with children, their challenges and stresses, and Emerick's reaction to their issues. Clinician identified the value of focusing on the here and now with gratitude as an alternative to worry and stress over what is out of his control. Clinician also discussed the role of faith and belief in [name]'s ability to feel fortified and supported. Kriston shared that he continues to participate in [employer] and NA meetings, which has provided him support and fellowship.   Plan: Return again in 2 weeks.  Diagnosis: Major depression, recurrent, chronic ([place])  Collaboration of Care: Patient refused AEB none required in this session  Patient/Guardian was advised Release of Information must be obtained prior to any record release in order to collaborate their care with an outside provider. Patient/Guardian was advised if they have not already done so to contact the registration department to sign all necessary forms in order for [place] to release information regarding their care.   Consent: Patient/Guardian gives verbal consent for treatment and assignment of benefits for services provided during this visit. Patient/Guardian expressed understanding and agreed to proceed.   [place], LCSW 05/30/2022

## 2022-06-08 ENCOUNTER — Ambulatory Visit: Payer: Medicare Other | Attending: Cardiovascular Disease

## 2022-06-08 DIAGNOSIS — I482 Chronic atrial fibrillation, unspecified: Secondary | ICD-10-CM | POA: Diagnosis not present

## 2022-06-08 DIAGNOSIS — Z7901 Long term (current) use of anticoagulants: Secondary | ICD-10-CM

## 2022-06-08 LAB — POCT INR: INR: 4.9 — AB (ref 2.0–3.0)

## 2022-06-08 NOTE — Patient Instructions (Signed)
Description   Hold Warfarin today and tomorrow and then continue taking 1 tablet daily except for 1/2 tablet on Sunday, Tuesday and Friday.  Recheck INR in 2 weeks.  [facility] [phone]

## 2022-06-12 ENCOUNTER — Other Ambulatory Visit: Payer: Self-pay | Admitting: Nurse Practitioner

## 2022-06-12 DIAGNOSIS — E119 Type 2 diabetes mellitus without complications: Secondary | ICD-10-CM

## 2022-06-13 ENCOUNTER — Encounter (HOSPITAL_COMMUNITY): Payer: Self-pay | Admitting: Licensed Clinical Social Worker

## 2022-06-13 ENCOUNTER — Ambulatory Visit (INDEPENDENT_AMBULATORY_CARE_PROVIDER_SITE_OTHER): Payer: Medicare Other | Admitting: Licensed Clinical Social Worker

## 2022-06-13 DIAGNOSIS — F339 Major depressive disorder, recurrent, unspecified: Secondary | ICD-10-CM

## 2022-06-13 NOTE — Progress Notes (Signed)
   THERAPIST PROGRESS NOTE  Session Time: 9:00am-9:55am  Participation Level: Active  Behavioral Response: NeatAlertDepressed  Type of Therapy: Individual Therapy  Treatment Goals addressed: "to cope with stress and manage depression and anxiety". [name] will report improved stress management and coping skills, resulting in mood stability and less worrying 4 out of 7 days.     ProgressTowards Goals: Progressing  Interventions: Motivational Interviewing  Summary: Rodney Friedman is a 77 y.o. male who presents with MDD, recurrent, chronic.   Suicidal/Homicidal: Nowithout intent/plan  Therapist Response: [name] engaged well in individual session with Pension scheme manager. Clinician processed thoughts, feelings, and interactions using MI OARS. Clinician explored updates on health, particularly concerns about changes in levels regarding his prostate and lung functioning. Clinician identified the improvement in overall health since quitting smoking 21 months ago. Clinician explored relationships with his daughter and son, as well as grandchildren. [name] processed thoughts and feelings about invitation to move to [facility] with daughter and family, as well as identifying the multitude of challenges if that decision was made. Clinician explored relationship with son, noting ongoing frustration with son's decisions, but also the realization that he cannot change his son.   Plan: Return again in 2 weeks.  Diagnosis: Major depression, recurrent, chronic (HCC)  Collaboration of Care: Medication Management AEB provided updates to Dr. Casimiro Needle  Patient/Guardian was advised Release of Information must be obtained prior to any record release in order to collaborate their care with an outside provider. Patient/Guardian was advised if they have not already done so to contact the registration department to sign all necessary forms in order for [place] to release information regarding their care.   Consent: Patient/Guardian gives  verbal consent for treatment and assignment of benefits for services provided during this visit. Patient/Guardian expressed understanding and agreed to proceed.   Jobe Marker [employer], LCSW 06/13/2022

## 2022-06-22 ENCOUNTER — Ambulatory Visit: Payer: Medicare Other | Attending: Internal Medicine | Admitting: *Deleted

## 2022-06-22 DIAGNOSIS — Z7901 Long term (current) use of anticoagulants: Secondary | ICD-10-CM | POA: Diagnosis not present

## 2022-06-22 DIAGNOSIS — I482 Chronic atrial fibrillation, unspecified: Secondary | ICD-10-CM

## 2022-06-22 LAB — POCT INR: INR: 2.4 (ref 2.0–3.0)

## 2022-06-22 NOTE — Patient Instructions (Signed)
Description   Continue taking warfarin 1 tablet daily except for 1/2 tablet on Sunday, Tuesday and Friday.  Recheck INR in 4 weeks. Coumadin Clinic [phone]

## 2022-06-27 ENCOUNTER — Ambulatory Visit (HOSPITAL_COMMUNITY): Payer: Medicare Other | Admitting: Licensed Clinical Social Worker

## 2022-06-27 ENCOUNTER — Encounter (HOSPITAL_COMMUNITY): Payer: Self-pay

## 2022-07-11 ENCOUNTER — Ambulatory Visit (INDEPENDENT_AMBULATORY_CARE_PROVIDER_SITE_OTHER): Payer: Medicare Other | Admitting: Licensed Clinical Social Worker

## 2022-07-11 ENCOUNTER — Encounter (HOSPITAL_COMMUNITY): Payer: Self-pay | Admitting: Licensed Clinical Social Worker

## 2022-07-11 DIAGNOSIS — F339 Major depressive disorder, recurrent, unspecified: Secondary | ICD-10-CM | POA: Diagnosis not present

## 2022-07-11 NOTE — Progress Notes (Signed)
   THERAPIST PROGRESS NOTE  Session Time: 9:00am-9:55am  Participation Level: Active  Behavioral Response: NeatAlertDepressed  Type of Therapy: Individual Therapy  Treatment Goals addressed:  "to cope with stress and manage depression and anxiety". Malacai will report improved stress management and coping skills, resulting in mood stability and less worrying 4 out of 7 days.      ProgressTowards Goals: Progressing  Interventions: CBT  Summary: Terin Barse is a 77 y.o. male who presents with MDD, recurrent, chronic.   Suicidal/Homicidal: Nowithout intent/plan  Therapist Response: Maxey engaged well in individual session with Pension scheme manager. Clinician processed current status, noting "disgust with life", frustration with technology and never ending to-do list, and grief. Clinician processed these concerns using CBT. Clinician explored ways to challenge the laundry list of stressors and to reframe as gratitude. Clinician identified the importance of being able to move through these challenges without catastrophizing. Clinician reminded Taishaun that these issues are a part of normal life that everyone has to go through. Clinician explored resources to ease his life, as well as ways to change his attitude about the to-do list.   Plan: Return again in 2 weeks.  Diagnosis: Major depression, recurrent, chronic ([place])  Collaboration of Care: Patient refused AEB none required in this session  Patient/Guardian was advised Release of Information must be obtained prior to any record release in order to collaborate their care with an outside provider. Patient/Guardian was advised if they have not already done so to contact the registration department to sign all necessary forms in order for [place] to release information regarding their care.   Consent: Patient/Guardian gives verbal consent for treatment and assignment of benefits for services provided during this visit. Patient/Guardian expressed understanding  and agreed to proceed.   Jobe Marker [place], LCSW 07/11/2022

## 2022-07-20 ENCOUNTER — Ambulatory Visit: Payer: Medicare Other

## 2022-07-25 ENCOUNTER — Ambulatory Visit (INDEPENDENT_AMBULATORY_CARE_PROVIDER_SITE_OTHER): Payer: Medicare Other | Admitting: Licensed Clinical Social Worker

## 2022-07-25 ENCOUNTER — Ambulatory Visit: Payer: Medicare Other | Attending: Cardiovascular Disease | Admitting: *Deleted

## 2022-07-25 DIAGNOSIS — F339 Major depressive disorder, recurrent, unspecified: Secondary | ICD-10-CM | POA: Diagnosis not present

## 2022-07-25 DIAGNOSIS — Z7901 Long term (current) use of anticoagulants: Secondary | ICD-10-CM | POA: Insufficient documentation

## 2022-07-25 DIAGNOSIS — I482 Chronic atrial fibrillation, unspecified: Secondary | ICD-10-CM | POA: Diagnosis present

## 2022-07-25 LAB — POCT INR: INR: 2.4 (ref 2.0–3.0)

## 2022-07-25 NOTE — Patient Instructions (Signed)
Description   Continue taking warfarin 1 tablet daily except for 1/2 tablet on Sunday, Tuesday and Friday.  Recheck INR in 5 weeks. [facility] [phone]

## 2022-07-27 ENCOUNTER — Telehealth: Payer: Self-pay | Admitting: Hematology and Oncology

## 2022-07-27 ENCOUNTER — Inpatient Hospital Stay (HOSPITAL_BASED_OUTPATIENT_CLINIC_OR_DEPARTMENT_OTHER): Payer: Medicare Other | Admitting: Hematology and Oncology

## 2022-07-27 ENCOUNTER — Encounter (HOSPITAL_COMMUNITY): Payer: Self-pay | Admitting: Licensed Clinical Social Worker

## 2022-07-27 ENCOUNTER — Other Ambulatory Visit: Payer: Self-pay

## 2022-07-27 ENCOUNTER — Encounter: Payer: Self-pay | Admitting: Hematology and Oncology

## 2022-07-27 ENCOUNTER — Inpatient Hospital Stay: Payer: Medicare Other | Attending: Nurse Practitioner

## 2022-07-27 VITALS — BP 154/76 | HR 78 | Temp 98.1°F | Resp 16 | Wt 216.2 lb

## 2022-07-27 DIAGNOSIS — C61 Malignant neoplasm of prostate: Secondary | ICD-10-CM | POA: Insufficient documentation

## 2022-07-27 DIAGNOSIS — C9110 Chronic lymphocytic leukemia of B-cell type not having achieved remission: Secondary | ICD-10-CM

## 2022-07-27 LAB — CBC WITH DIFFERENTIAL/PLATELET
Abs Immature Granulocytes: 0.03 10*3/uL (ref 0.00–0.07)
Basophils Absolute: 0.1 10*3/uL (ref 0.0–0.1)
Basophils Relative: 1 %
Eosinophils Absolute: 0.3 10*3/uL (ref 0.0–0.5)
Eosinophils Relative: 2 %
HCT: 44.4 % (ref 39.0–52.0)
Hemoglobin: 15.7 g/dL (ref 13.0–17.0)
Immature Granulocytes: 0 %
Lymphocytes Relative: 43 %
Lymphs Abs: 5.3 10*3/uL — ABNORMAL HIGH (ref 0.7–4.0)
MCH: 31.5 pg (ref 26.0–34.0)
MCHC: 35.4 g/dL (ref 30.0–36.0)
MCV: 89 fL (ref 80.0–100.0)
Monocytes Absolute: 1 10*3/uL (ref 0.1–1.0)
Monocytes Relative: 9 %
Neutro Abs: 5.5 10*3/uL (ref 1.7–7.7)
Neutrophils Relative %: 45 %
Platelets: 166 10*3/uL (ref 150–400)
RBC: 4.99 MIL/uL (ref 4.22–5.81)
RDW: 13.2 % (ref 11.5–15.5)
Smear Review: NORMAL
WBC: 12.2 10*3/uL — ABNORMAL HIGH (ref 4.0–10.5)
nRBC: 0 % (ref 0.0–0.2)

## 2022-07-27 NOTE — Assessment & Plan Note (Signed)
The patient is being followed by urologist team for management of prostate cancer He is currently not symptomatic
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
## 2022-07-27 NOTE — Assessment & Plan Note (Signed)
He has no clinical signs of disease progression I will continue to see him once a year

## 2022-07-27 NOTE — Telephone Encounter (Signed)
Spoke with patient confirming next year appointments

## 2022-07-27 NOTE — Progress Notes (Signed)
[facility] OFFICE PROGRESS NOTE  Patient Care Team: Fenton Foy, NP as PCP - General (General Practice) Croitoru, Dani Gobble, MD as PCP - Cardiology (Cardiology) Croitoru, Dani Gobble, MD as Consulting Physician (Cardiology) Gaye Pollack, MD as Consulting Physician (Cardiothoracic Surgery) Ardis Hughs, MD as Attending Physician (Urology) Norma Fredrickson, MD as Consulting Physician (Psychiatry) Freddi Starr, MD as Consulting Physician (Pulmonary Disease) Marilynne Halsted, MD as Referring Physician (Ophthalmology) Feliz Beam, MD as Referring Physician (Ophthalmology) Heath Lark, MD as Consulting Physician (Hematology and Oncology) [name], LCSW as Counselor (Licensed Clinical Social Worker)  ASSESSMENT & PLAN:  CLL (chronic lymphocytic leukemia) [facility]) He has no clinical signs of disease progression I will continue to see him once a year  Prostate cancer [facility]) The patient is being followed by urologist team for management of prostate cancer He is currently not symptomatic  No orders of the defined types were placed in this encounter.   All questions were answered. The patient knows to call the clinic with any problems, questions or concerns. The total time spent in the appointment was 20 minutes encounter with patients including review of chart and various tests results, discussions about plan of care and coordination of care plan   Heath Lark, MD 07/27/2022 8:55 AM  INTERVAL HISTORY: Please see below for problem oriented charting. he returns for surveillance follow-up for history of CLL He is doing well No new lymphadenopathy He was told that his PSA is starting to trend up but still total PSA less than 1 He has no bone pain No recent bleeding complications from his anticoagulation therapy He has appointment to follow-up with his dermatologist to follow-up on his skin lesions  REVIEW OF SYSTEMS:   Constitutional: Denies fevers,  chills or abnormal weight loss Eyes: Denies blurriness of vision Ears, nose, mouth, throat, and face: Denies mucositis or sore throat Respiratory: Denies cough, dyspnea or wheezes Cardiovascular: Denies palpitation, chest discomfort or lower extremity swelling Gastrointestinal:  Denies nausea, heartburn or change in bowel habits Skin: Denies abnormal skin rashes Lymphatics: Denies new lymphadenopathy or easy bruising Neurological:Denies numbness, tingling or new weaknesses Behavioral/Psych: Mood is stable, no new changes  All other systems were reviewed with the patient and are negative.  I have reviewed the past medical history, past surgical history, social history and family history with the patient and they are unchanged from previous note.  ALLERGIES:  is allergic to other and poison ivy extract.  MEDICATIONS:  Current Outpatient Medications  Medication Sig Dispense Refill   albuterol (VENTOLIN HFA) 108 (90 Base) MCG/ACT inhaler Inhale 2 puffs into the lungs every 6 (six) hours as needed for wheezing or shortness of breath. 8 g 11   amoxicillin (AMOXIL) 500 MG tablet Take 4 tablets (2000 mg) by mouth ONE HOUR before any dental procedures. (Patient not taking: Reported on 05/26/2022) 12 tablet 4   B Complex Vitamins (VITAMIN-B COMPLEX PO) Take 1 tablet by mouth daily with supper.     BIOTIN PO Take 1 tablet by mouth every evening.     buPROPion (WELLBUTRIN SR) 200 MG 12 hr tablet Take 1 tablet (200 mg total) by mouth every morning. 90 tablet 2   buPROPion ER (WELLBUTRIN SR) 100 MG 12 hr tablet 1 qhs 90 tablet 4   Calcium Carbonate-Vit D-Min (CALCIUM 1200 PO) Take 1,200 mg by mouth every evening.     calcium elemental as carbonate (TUMS ULTRA 1000) 400 MG chewable tablet Chew 1,000 mg by mouth daily  as needed for heartburn.     docusate sodium (COLACE) 100 MG capsule Take 100 mg by mouth every evening.     ezetimibe (ZETIA) 10 MG tablet TAKE 1 TABLET BY MOUTH EVERY DAY 90 tablet 3    gabapentin (NEURONTIN) 100 MG capsule 3  qhs 270 capsule 1   ketoconazole (NIZORAL) 2 % shampoo Apply 1 application topically 2 (two) times a week. 120 mL 3   latanoprost (XALATAN) 0.005 % ophthalmic solution Place 1 drop into the left eye every other day.     LYCOPENE PO Take 10 mg by mouth every other day.     Magnesium 250 MG TABS Take 250 mg by mouth every 3 (three) days.     metFORMIN (GLUCOPHAGE) 500 MG tablet TAKE 1 TABLET BY MOUTH 2 TIMES DAILY WITH A MEAL. 180 tablet 1   metoprolol (TOPROL-XL) 50 MG 24 hr tablet Take 1 tablet (50 mg total) by mouth 2 (two) times daily. Take with or immediately following a meal. 30 tablet 0   Omega-3 Fatty Acids (FISH OIL) 1000 MG CAPS Take 1,000 mg by mouth daily with supper.     rosuvastatin (CRESTOR) 20 MG tablet TAKE 1 TABLET BY MOUTH EVERY DAY 90 tablet 3   Sodium Fluoride (PREVIDENT 5000 DRY MOUTH DT) Place 1 application onto teeth See admin instructions. Apply to gums every night for dry mouth     vitamin B-12 (CYANOCOBALAMIN) 500 MCG tablet Take 500 mcg by mouth every other day.     vitamin C (ASCORBIC ACID) 500 MG tablet Take 500-1,000 mg by mouth See admin instructions. Alternate taking 500 mg one day and 1000 mg the next     warfarin (COUMADIN) 4 MG tablet TAKE 1/2 TO 1 TABLET DAILY AS DIRECTED BY COUMADIN CLINIC 90 tablet 1   No current facility-administered medications for this visit.    SUMMARY OF ONCOLOGIC HISTORY: Oncology History Overview Note  Prostate cancer   Primary site: Prostate (Left)   Staging method: AJCC 7th Edition   Clinical: Stage IIB (T1c, N0, M0) signed by Heath Lark, MD on 07/09/2013 10:04 AM   Summary: Stage IIB (T1c, N0, M0)  CLL stage I. FISH normal   CLL (chronic lymphocytic leukemia) ([place])  07/08/2013 Pathology Results   Peripheral Blood Flow Cytometry - MONOCLONAL B-CELL POPULATION IDENTIFIED. Diagnosis Comment: The phenotypic features are consistent with chronic lymphocytic leukemia. Clinical correlation  is recommended.    07/08/2013 Pathology Results   FISH panel is normal   07/20/2020 Cancer Staging   Staging form: Chronic Lymphocytic Leukemia / Small Lymphocytic Lymphoma, AJCC 8th Edition - Clinical stage from 07/20/2020: Modified Rai Stage 0 (Modified Rai risk: Low, Lymphocytosis: Present, Adenopathy: Absent, Organomegaly: Absent, Anemia: Absent, Thrombocytopenia: Absent) - Signed by Heath Lark, MD on 07/20/2020   Prostate cancer ([place])  07/12/2012 Tumor Marker   PSA was high at 9.6   09/30/2012 Procedure   He underwent prostate biopsy at confirm diagnosis of prostate cancer.   11/11/2012 - 01/09/2013 Radiation Therapy   The patient completed radiation therapy to his prostate using IM RT technique   06/22/2017 PET scan   PET scan showed left external LN involvement along with activity within prostate     PHYSICAL EXAMINATION: ECOG PERFORMANCE STATUS: 0 - Asymptomatic  Vitals:   07/27/22 0804  BP: (!) 154/76  Pulse: 78  Resp: 16  Temp: 98.1 F (36.7 C)  SpO2: 99%   Filed Weights   07/27/22 0804  Weight: 216 lb 3.2 oz (98.1  kg)    GENERAL:alert, no distress and comfortable SKIN: skin color, texture, turgor are normal, no rashes or significant lesions EYES: normal, Conjunctiva are pink and non-injected, sclera clear OROPHARYNX:no exudate, no erythema and lips, buccal mucosa, and tongue normal  NECK: supple, thyroid normal size, non-tender, without nodularity LYMPH:  no palpable lymphadenopathy in the cervical, axillary or inguinal LUNGS: clear to auscultation and percussion with normal breathing effort HEART: regular rate & rhythm and no murmurs and no lower extremity edema ABDOMEN:abdomen soft, non-tender and normal bowel sounds Musculoskeletal:no cyanosis of digits and no clubbing  NEURO: alert & oriented x 3 with fluent speech, no focal motor/sensory deficits  LABORATORY DATA:  I have reviewed the data as listed    Component Value Date/Time   NA 144 05/26/2022 0908    NA 141 01/04/2015 0808   K 4.8 05/26/2022 0908   K 4.3 01/04/2015 0808   CL 105 05/26/2022 0908   CO2 23 05/26/2022 0908   CO2 21 (L) 01/04/2015 0808   GLUCOSE 135 (H) 05/26/2022 0908   GLUCOSE 102 (H) 02/09/2021 0116   GLUCOSE 115 01/04/2015 0808   BUN 16 05/26/2022 0908   BUN 18.7 01/04/2015 0808   CREATININE 1.11 05/26/2022 0908   CREATININE 1.16 03/10/2016 0830   CREATININE 0.9 01/04/2015 0808   CALCIUM 10.0 05/26/2022 0908   CALCIUM 9.0 01/04/2015 0808   PROT 6.6 05/26/2022 0908   PROT 6.3 (L) 01/04/2015 0808   ALBUMIN 4.3 05/26/2022 0908   ALBUMIN 3.7 01/04/2015 0808   AST 35 05/26/2022 0908   AST 19 01/04/2015 0808   ALT 32 05/26/2022 0908   ALT 33 01/04/2015 0808   ALKPHOS 46 05/26/2022 0908   ALKPHOS 47 01/04/2015 0808   BILITOT 0.4 05/26/2022 0908   BILITOT 0.23 01/04/2015 0808   GFRNONAA 56 (L) 02/09/2021 0116   GFRNONAA 70 02/11/2016 1526   GFRAA 79 04/26/2020 0941   GFRAA 81 02/11/2016 1526    No results found for: "SPEP", "UPEP"  Lab Results  Component Value Date   WBC 12.2 (H) 07/27/2022   NEUTROABS 5.5 07/27/2022   HGB 15.7 07/27/2022   HCT 44.4 07/27/2022   MCV 89.0 07/27/2022   PLT 166 07/27/2022      Chemistry      Component Value Date/Time   NA 144 05/26/2022 0908   NA 141 01/04/2015 0808   K 4.8 05/26/2022 0908   K 4.3 01/04/2015 0808   CL 105 05/26/2022 0908   CO2 23 05/26/2022 0908   CO2 21 (L) 01/04/2015 0808   BUN 16 05/26/2022 0908   BUN 18.7 01/04/2015 0808   CREATININE 1.11 05/26/2022 0908   CREATININE 1.16 03/10/2016 0830   CREATININE 0.9 01/04/2015 0808      Component Value Date/Time   CALCIUM 10.0 05/26/2022 0908   CALCIUM 9.0 01/04/2015 0808   ALKPHOS 46 05/26/2022 0908   ALKPHOS 47 01/04/2015 0808   AST 35 05/26/2022 0908   AST 19 01/04/2015 0808   ALT 32 05/26/2022 0908   ALT 33 01/04/2015 0808   BILITOT 0.4 05/26/2022 0908   BILITOT 0.23 01/04/2015 [id number]

## 2022-07-28 ENCOUNTER — Other Ambulatory Visit: Payer: Self-pay | Admitting: Cardiovascular Disease

## 2022-07-28 DIAGNOSIS — I25709 Atherosclerosis of coronary artery bypass graft(s), unspecified, with unspecified angina pectoris: Secondary | ICD-10-CM

## 2022-08-08 ENCOUNTER — Encounter (HOSPITAL_COMMUNITY): Payer: Self-pay | Admitting: Psychiatry

## 2022-08-08 ENCOUNTER — Ambulatory Visit (HOSPITAL_BASED_OUTPATIENT_CLINIC_OR_DEPARTMENT_OTHER): Payer: Medicare Other | Admitting: Psychiatry

## 2022-08-08 VITALS — BP 153/95 | HR 97 | Ht 68.0 in | Wt 217.0 lb

## 2022-08-08 DIAGNOSIS — F324 Major depressive disorder, single episode, in partial remission: Secondary | ICD-10-CM | POA: Diagnosis not present

## 2022-08-08 MED ORDER — GABAPENTIN 100 MG PO CAPS: ORAL_CAPSULE | ORAL | 1 refills | Status: DC

## 2022-08-08 MED ORDER — BUPROPION HCL ER (SR) 100 MG PO TB12: ORAL_TABLET | ORAL | 4 refills | Status: DC

## 2022-08-08 MED ORDER — BUPROPION HCL ER (SR) 200 MG PO TB12: 200.0000 mg | ORAL_TABLET | Freq: Every morning | ORAL | 2 refills | Status: DC

## 2022-08-08 NOTE — Progress Notes (Signed)
Psychiatric Initial Adult Assessment   Patient Identification: Rodney Friedman MRN:  [id number] Date of Evaluation:  08/08/2022 Referral Source: Dr. Nyoka Cowden Chief Complaint: Need follow-up care  Chief Complaint   Follow-up    Visit Diagnosis: Bipolar disorder, substance use disorder Bipolar disorder  History of Present Illness:  Today the patient is doing only fairly well.  He feels a lot of rage over the last 6 weeks.  This is mainly due to issues with technology.  He is having trouble with his cable.  He is having trouble with his printer.  He is having trouble with his bank.  There are some questions about whether or not he wants to change his checking account which would be a disaster.  He changed his checking account he has about 10 deficits and have to change all the debtors to a new number.  He feels outraged and irritated by this.  He denies really being depressed persistently.  He knows that he is bothered by.  He continues to be sober for over 20 years.  He does not really feel depressed more bothered by the circumstances.  He is sleeping and eating well has good energy.  He continues seeing his therapist Janett Billow here and does well.  His back pain is actually improved.  With exercises he believes is reduced his pain but I suspect perhaps gabapentin for me has helped a bit.  He denies any chest pain or shortness of breath.  Medically he is reasonably healthy.  He has family in [place] that are doing fairly well.  The geek squad is coming to his house today to fix his printer, the cable people being called home to him later today or tomorrow.  He is still functioning quite well and is trying to problem solve.  I think he is thinking processes good.  I think he is frustrated by the Engineer, drilling.  He appropriately is very guarded about giving away any information to anyone.   Substance Abuse History in the last 12 months:  Yes.    Consequences of Substance Abuse: NA  Past Medical  History:  Past Medical History:  Diagnosis Date   Adenomatous colon polyp    Alcoholism ([place])    Anxiety    Atrial fibrillation ([facility])    CAD (coronary artery disease)    Cataract    removed left eye    CHF (congestive heart failure) ([place]) 12/16/2020   CLL (chronic lymphocytic leukemia) ([place]) 07/08/2013   Degenerative arthritis of lumbar spine    Degenerative disc disease, lumbar    Depression    Diabetes mellitus without complication ([place])    Diverticulosis    Dyspnea    Dysrhythmia    Eye abnormality    Macular scarring R eye   Glaucoma    Heart murmur 2002   "diagnosed about 20 years ago". 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 Angelena Form & Dr. Cyndia Bent   Sleep apnea    Substance abuse ([place])  Tremor, essential 09/22/2015    Past Surgical History:  Procedure Laterality Date   Arm Surgery Right    from door accident with glass   CARDIAC CATHETERIZATION     CATARACT EXTRACTION W/ INTRAOCULAR LENS IMPLANT Bilateral    COLONOSCOPY     CORONARY ARTERY BYPASS GRAFT  08/04/2004   EYE SURGERY     POLYPECTOMY     RIGHT/LEFT HEART CATH AND CORONARY/GRAFT ANGIOGRAPHY N/A 01/20/2021   Procedure: RIGHT/LEFT HEART CATH AND CORONARY/GRAFT ANGIOGRAPHY;  Surgeon: Burnell Blanks, MD;  Location: [facility];  Service: Cardiovascular;  Laterality: N/A;   TEE WITHOUT CARDIOVERSION N/A 02/08/2021   Procedure: TRANSESOPHAGEAL ECHOCARDIOGRAM (TEE);  Surgeon: Burnell Blanks, MD;  Location: [facility];  Service: Open Heart Surgery;  Laterality: N/A;    TRANSCATHETER AORTIC VALVE REPLACEMENT, CAROTID Left 02/08/2021   Procedure: TRANSCATHETER AORTIC VALVE REPLACEMENT, LEFT CAROTID;  Surgeon: Burnell Blanks, MD;  Location: [facility];  Service: Open Heart Surgery;  Laterality: Left;    Family Psychiatric History:   Family History:  Family History  Problem Relation Age of Onset   Colon cancer Mother    Ovarian cancer Mother    Uterine cancer Mother    Hypertension Father    Prostate cancer Father    Colon polyps Neg Hx     Social History:   Social History   Socioeconomic History   Marital status: Divorced    Spouse name: Not on file   Number of children: 2   Years of education: 16   Highest education level: Not on file  Occupational History   Occupation: Retired Pharmacist, hospital  Tobacco Use   Smoking status: Former    Packs/day: 0.50    Years: 50.00    Additional pack years: 0.00    Total pack years: 25.00    Types: Cigarettes    Quit date: 12/16/2020    Years since quitting: 1.6   Smokeless tobacco: Never  Vaping Use   Vaping Use: Never used  Substance and Sexual Activity   Alcohol use: No    Alcohol/week: 0.0 standard drinks of alcohol   Drug use: No   Sexual activity: Not Currently  Other Topics Concern   Not on file  Social History Narrative   Lives alone   Divorced   Right-handed   Education: College   No caffeine   Social Determinants of Health   Financial Resource Strain: Low Risk  (05/18/2022)   Overall Financial Resource Strain (CARDIA)    Difficulty of Paying Living Expenses: Not hard at all  Food Insecurity: No Food Insecurity (05/18/2022)   Hunger Vital Sign    Worried About Running Out of Food in the Last Year: Never true    [place] in the Last Year: Never true  Transportation Needs: No Transportation Needs (05/18/2022)   PRAPARE - Hydrologist (Medical): No    Lack of Transportation (Non-Medical): No  Physical Activity: Insufficiently Active  (05/18/2022)   Exercise Vital Sign    Days of Exercise per Week: 5 days    Minutes of Exercise per Session: 20 min  Stress: Stress Concern Present (05/18/2022)   [place]    Feeling of Stress : To some extent  Social Connections: Moderately Integrated (05/18/2022)   Social Connection and Isolation Panel [NHANES]    Frequency of Communication with Friends and Family: More than three times a week  Frequency of Social Gatherings with Friends and Family: More than three times a week    Attends Religious Services: More than 4 times per year    Active Member of [employer] or Organizations: Yes    Attends Music therapist: More than 4 times per year    Marital Status: Divorced    Additional Social History:   Allergies:   Allergies  Allergen Reactions   Other Rash    Allergen: "Plants and bushes while doing yard workDatabase administrator Extract Rash    Metabolic Disorder Labs: Lab Results  Component Value Date   HGBA1C 7.2 (A) 05/26/2022   MPG 128 12/17/2020   MPG 117 02/11/2016   No results found for: "PROLACTIN" Lab Results  Component Value Date   CHOL 99 (L) 05/26/2022   TRIG 129 05/26/2022   HDL 34 (L) 05/26/2022   CHOLHDL 2.9 05/26/2022   VLDL 41 (H) 03/10/2016   LDLCALC 42 05/26/2022   LDLCALC 37 04/27/2022     Current Medications: Current Outpatient Medications  Medication Sig Dispense Refill   albuterol (VENTOLIN HFA) 108 (90 Base) MCG/ACT inhaler Inhale 2 puffs into the lungs every 6 (six) hours as needed for wheezing or shortness of breath. 8 g 11   amoxicillin (AMOXIL) 500 MG tablet Take 4 tablets (2000 mg) by mouth ONE HOUR before any dental procedures. 12 tablet 4   B Complex Vitamins (VITAMIN-B COMPLEX PO) Take 1 tablet by mouth daily with supper.     BIOTIN PO Take 1 tablet by mouth every evening.     Calcium Carbonate-Vit D-Min (CALCIUM 1200 PO) Take 1,200 mg by mouth every evening.      calcium elemental as carbonate (TUMS ULTRA 1000) 400 MG chewable tablet Chew 1,000 mg by mouth daily as needed for heartburn.     docusate sodium (COLACE) 100 MG capsule Take 100 mg by mouth every evening.     ezetimibe (ZETIA) 10 MG tablet TAKE 1 TABLET BY MOUTH EVERY DAY 90 tablet 3   ketoconazole (NIZORAL) 2 % shampoo Apply 1 application topically 2 (two) times a week. 120 mL 3   latanoprost (XALATAN) 0.005 % ophthalmic solution Place 1 drop into the left eye every other day.     LYCOPENE PO Take 10 mg by mouth every other day.     Magnesium 250 MG TABS Take 250 mg by mouth every 3 (three) days.     metFORMIN (GLUCOPHAGE) 500 MG tablet TAKE 1 TABLET BY MOUTH 2 TIMES DAILY WITH A MEAL. 180 tablet 1   metoprolol (TOPROL-XL) 50 MG 24 hr tablet Take 1 tablet (50 mg total) by mouth 2 (two) times daily. Take with or immediately following a meal. 30 tablet 0   Omega-3 Fatty Acids (FISH OIL) 1000 MG CAPS Take 1,000 mg by mouth daily with supper.     rosuvastatin (CRESTOR) 20 MG tablet TAKE 1 TABLET BY MOUTH EVERY DAY 90 tablet 3   Sodium Fluoride (PREVIDENT 5000 DRY MOUTH DT) Place 1 application onto teeth See admin instructions. Apply to gums every night for dry mouth     vitamin B-12 (CYANOCOBALAMIN) 500 MCG tablet Take 500 mcg by mouth every other day.     vitamin C (ASCORBIC ACID) 500 MG tablet Take 500-1,000 mg by mouth See admin instructions. Alternate taking 500 mg one day and 1000 mg the next     warfarin (COUMADIN) 4 MG tablet TAKE 1/2 TO 1 TABLET DAILY AS DIRECTED BY COUMADIN CLINIC 90  tablet 1   buPROPion (WELLBUTRIN SR) 200 MG 12 hr tablet Take 1 tablet (200 mg total) by mouth every morning. 90 tablet 2   buPROPion ER (WELLBUTRIN SR) 100 MG 12 hr tablet 1 qhs 90 tablet 4   gabapentin (NEURONTIN) 100 MG capsule 3  qhs 270 capsule 1   No current facility-administered medications for this visit.    Neurologic: Headache: No Seizure: No Paresthesias:No  Musculoskeletal: Strength &  Muscle Tone: within normal limits Gait & Station: normal Patient leans: Right  Psychiatric Specialty Exam: ROS  Blood pressure (!) 153/95, pulse 97, height 5\' 8"  (1.727 m), weight 217 lb (98.4 kg).Body mass index is 32.99 kg/m.  General Appearance: Casual  Eye Contact:  Good  Speech:  Clear and Coherent  Volume:  Normal  Mood:  NA  Affect:  Appropriate  Thought Process:  Goal Directed  Orientation:  NA  Thought Content:  WDL  Suicidal Thoughts:  No  Homicidal Thoughts:  No  Memory:  Negative  Judgement:  Good  Insight:  Good  Psychomotor Activity:  Normal  Concentration:    Recall:  [place] of Knowledge:Good  Language: Good  Akathisia:  No  Handed:  Right  AIMS (if indicated):    Assets:  Desire for Improvement  ADL's:  Intact  Cognition: WNL  Sleep:      Treatment Plan Summary: 3/19/20241:51 PM    Today the patient is doing fairly well.  He is very stressed out biotechnology issues.  He continues taking Wellbutrin 200 mg in the morning and 100 mg later in the day.  He continues gabapentin.  Hi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  He also has substance use disorder which at this time is in remission.  The patient will continue in one-to-one therapy and return to see me in 5 months.

## 2022-08-22 ENCOUNTER — Other Ambulatory Visit: Payer: Self-pay | Admitting: *Deleted

## 2022-08-22 ENCOUNTER — Encounter (HOSPITAL_COMMUNITY): Payer: Self-pay | Admitting: Licensed Clinical Social Worker

## 2022-08-22 ENCOUNTER — Ambulatory Visit (INDEPENDENT_AMBULATORY_CARE_PROVIDER_SITE_OTHER): Payer: Medicare Other | Admitting: Licensed Clinical Social Worker

## 2022-08-22 DIAGNOSIS — F339 Major depressive disorder, recurrent, unspecified: Secondary | ICD-10-CM | POA: Diagnosis not present

## 2022-08-22 DIAGNOSIS — I10 Essential (primary) hypertension: Secondary | ICD-10-CM

## 2022-08-22 MED ORDER — METOPROLOL SUCCINATE ER 50 MG PO TB24: 50.0000 mg | ORAL_TABLET | Freq: Two times a day (BID) | ORAL | 1 refills | Status: DC

## 2022-08-22 NOTE — Progress Notes (Signed)
   THERAPIST PROGRESS NOTE  Session Time: 8:00am-9:00am  Participation Level: Active  Behavioral Response: NeatAlertDepressed and Irritable  Type of Therapy: Individual Therapy  Treatment Goals addressed: "to cope with stress and manage depression and anxiety". Lannie will report improved stress management and coping skills, resulting in mood stability and less worrying 4 out of 7 days.       ProgressTowards Goals: Progressing  Interventions: CBT  Summary: Jasaun Plazola is a 77 y.o. male who presents with MDD, recurrent, chronic.   Suicidal/Homicidal: Nowithout intent/plan  Therapist Response: Samridh engaged well in individual session with Pension scheme manager. Clinician utilized CBT to process thoughts, feelings, and interactions since last session. Doni shared updates and concerns about financial issues, frustrations with bills, and ongoing challenges with technology. Clinician explored coping skills and thought processes, examining the intense level of frustration that lives just below the surface, which gets triggered quickly. Clinician discussed thought stopping skills and ways to challenge and change his thought processes. Clinician processed some fears that are rational regarding what will happen to him when he becomes unable to manage his accounts and care for himself. Clinician encouraged Manpreet to start planning for the future.   Plan: Return again in 2 weeks.  Diagnosis: Major depression, recurrent, chronic  Collaboration of Care: Psychiatrist AEB reviewed last note from Dr. Casimiro Needle  Patient/Guardian was advised Release of Information must be obtained prior to any record release in order to collaborate their care with an outside provider. Patient/Guardian was advised if they have not already done so to contact the registration department to sign all necessary forms in order for [place] to release information regarding their care.   Consent: Patient/Guardian gives verbal consent for treatment  and assignment of benefits for services provided during this visit. Patient/Guardian expressed understanding and agreed to proceed.   Jobe Marker [place], LCSW 08/22/2022

## 2022-08-25 ENCOUNTER — Ambulatory Visit: Payer: Self-pay | Admitting: Nurse Practitioner

## 2022-08-28 ENCOUNTER — Ambulatory Visit (INDEPENDENT_AMBULATORY_CARE_PROVIDER_SITE_OTHER): Payer: Medicare Other | Admitting: Nurse Practitioner

## 2022-08-28 ENCOUNTER — Encounter: Payer: Self-pay | Admitting: Nurse Practitioner

## 2022-08-28 VITALS — BP 154/93 | HR 80 | Temp 97.1°F | Ht 69.0 in | Wt 217.0 lb

## 2022-08-28 DIAGNOSIS — E1169 Type 2 diabetes mellitus with other specified complication: Secondary | ICD-10-CM

## 2022-08-28 DIAGNOSIS — E782 Mixed hyperlipidemia: Secondary | ICD-10-CM | POA: Diagnosis not present

## 2022-08-28 DIAGNOSIS — E119 Type 2 diabetes mellitus without complications: Secondary | ICD-10-CM | POA: Diagnosis not present

## 2022-08-28 LAB — POCT GLYCOSYLATED HEMOGLOBIN (HGB A1C): Hemoglobin A1C: 6.9 % — AB (ref 4.0–5.6)

## 2022-08-28 MED ORDER — EZETIMIBE 10 MG PO TABS: 10.0000 mg | ORAL_TABLET | Freq: Every day | ORAL | 3 refills | Status: DC

## 2022-08-28 MED ORDER — ROSUVASTATIN CALCIUM 20 MG PO TABS: 20.0000 mg | ORAL_TABLET | Freq: Every day | ORAL | 3 refills | Status: DC

## 2022-08-28 MED ORDER — METFORMIN HCL 500 MG PO TABS: 500.0000 mg | ORAL_TABLET | Freq: Two times a day (BID) | ORAL | 1 refills | Status: DC

## 2022-08-28 NOTE — Patient Instructions (Signed)
1. Type 2 diabetes mellitus with other specified complication, without long-term current use of insulin  - POCT glycosylated hemoglobin (Hb A1C)  2. Type 2 diabetes mellitus without complication, without long-term current use of insulin  - metFORMIN (GLUCOPHAGE) 500 MG tablet; Take 1 tablet (500 mg total) by mouth 2 (two) times daily with a meal.  Dispense: 180 tablet; Refill: 1  3. Mixed hyperlipidemia  - rosuvastatin (CRESTOR) 20 MG tablet; Take 1 tablet (20 mg total) by mouth daily.  Dispense: 90 tablet; Refill: 3   Follow up:  Follow up in 3 months

## 2022-08-28 NOTE — Assessment & Plan Note (Signed)
-   POCT glycosylated hemoglobin (Hb A1C)  2. Type 2 diabetes mellitus without complication, without long-term current use of insulin  - metFORMIN (GLUCOPHAGE) 500 MG tablet; Take 1 tablet (500 mg total) by mouth 2 (two) times daily with a meal.  Dispense: 180 tablet; Refill: 1  3. Mixed hyperlipidemia  - rosuvastatin (CRESTOR) 20 MG tablet; Take 1 tablet (20 mg total) by mouth daily.  Dispense: 90 tablet; Refill: 3   Follow up:  Follow up in 3 months

## 2022-08-28 NOTE — Progress Notes (Signed)
 @Patient  ID: Rodney Friedman, male    DOB: [birth year], 77 y.o.   MRN: [id number]  Chief Complaint  Patient presents with   Follow-up    Referring provider: Ivonne AndrewNichols, Zykia Walla S, NP   HPI   77 year old male with history of atrial fibrillation, hypertensive heart disease CAD, OSA, diabetes, tremor, central serous chorioretinopathy,, prostate cancer metastatic to intrapelvic lymph node, glaucoma, hyperlipidemia, cataracts.   Patient presents today for routine follow-up visit.  Overall he has been stable since his last visit here.  Patient is following with cardiology and Coumadin clinic.  He is following with urology to monitor elevated PSA level.  He has followed with oncology.  Patient does have a history of smoking but no longer smokes. Denies f/c/s, n/v/d, hemoptysis, PND, leg swelling Denies chest pain or edema   Allergies  Allergen Reactions   Other Rash    Allergen: "Plants and bushes while doing yard workAdministrator, Civil Service"   Poison Ivy Extract Rash    Immunization History  Administered Date(s) Administered   Fluad Quad(high Dose 65+) 01/22/2019   Influenza Split 02/17/2015   Influenza, High Dose Seasonal PF 02/15/2018, 01/22/2020, 01/23/2022   Influenza, Quadrivalent, Recombinant, Inj, Pf 01/22/2019   Influenza,inj,Quad PF,6+ Mos 02/11/2016, 02/08/2017   Influenza-Unspecified 02/17/2015, 01/22/2019, 01/23/2022   Moderna Sars-Covid-2 Vaccination 08/20/2019, 09/17/2019, 03/25/2020   Pneumococcal Conjugate-13 02/24/2014   Pneumococcal Polysaccharide-23 09/08/2015   Tdap 09/08/2015   Unspecified SARS-COV-2 Vaccination 08/20/2019   Zoster Recombinat (Shingrix) 02/23/2022, 04/26/2022   Zoster, Live 05/22/2013    Past Medical History:  Diagnosis Date   Adenomatous colon polyp    Alcoholism    Anxiety    Atrial fibrillation    CAD (coronary artery disease)    Cataract    removed left eye    CHF (congestive heart failure) 12/16/2020   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 Clifton JamesMcAlhany & Dr. Laneta SimmersBartle   Sleep apnea    Substance abuse    Tremor, essential 09/22/2015    Tobacco History: Social History   Tobacco Use  Smoking Status Former   Packs/day: 0.50   Years: 50.00   Additional pack years: 0.00   Total pack years: 25.00   Types: Cigarettes   Quit date: 12/16/2020   Years since quitting: 1.6  Smokeless Tobacco Never   Counseling given: Not Answered   Outpatient Encounter Medications as of 08/28/2022  Medication Sig   B Complex Vitamins (VITAMIN-B COMPLEX PO) Take 1 tablet by mouth daily with supper.   BIOTIN PO Take 1 tablet by mouth every evening.   buPROPion (WELLBUTRIN SR) 200 MG 12 hr tablet  Take 1 tablet (200 mg total) by mouth every morning.   buPROPion ER (WELLBUTRIN SR) 100 MG 12 hr tablet 1 qhs   Calcium Carbonate-Vit D-Min (CALCIUM 1200 PO) Take 1,200 mg by mouth every evening.   calcium elemental as carbonate (TUMS ULTRA 1000) 400 MG chewable tablet Chew 1,000 mg by mouth daily as needed for heartburn.   docusate sodium (COLACE) 100 MG capsule Take 100 mg by mouth every evening.   gabapentin (NEURONTIN) 100 MG capsule 3  qhs   ketoconazole (NIZORAL) 2 % shampoo Apply 1 application topically 2 (two) times a week.    latanoprost (XALATAN) 0.005 % ophthalmic solution Place 1 drop into the left eye every other day.   LYCOPENE PO Take 10 mg by mouth every other day.   Magnesium 250 MG TABS Take 250 mg by mouth every 3 (three) days.   metoprolol succinate (TOPROL-XL) 50 MG 24 hr tablet Take 1 tablet (50 mg total) by mouth 2 (two) times daily. Take with or immediately following a meal.   Omega-3 Fatty Acids (FISH OIL) 1000 MG CAPS Take 1,000 mg by mouth daily with supper.   Sodium Fluoride (PREVIDENT 5000 DRY MOUTH DT) Place 1 application onto teeth See admin instructions. Apply to gums every night for dry mouth   vitamin B-12 (CYANOCOBALAMIN) 500 MCG tablet Take 500 mcg by mouth every other day.   vitamin C (ASCORBIC ACID) 500 MG tablet Take 500-1,000 mg by mouth See admin instructions. Alternate taking 500 mg one day and 1000 mg the next   warfarin (COUMADIN) 4 MG tablet TAKE 1/2 TO 1 TABLET DAILY AS DIRECTED BY COUMADIN CLINIC   [DISCONTINUED] ezetimibe (ZETIA) 10 MG tablet TAKE 1 TABLET BY MOUTH EVERY DAY   [DISCONTINUED] metFORMIN (GLUCOPHAGE) 500 MG tablet TAKE 1 TABLET BY MOUTH 2 TIMES DAILY WITH A MEAL.   [DISCONTINUED] rosuvastatin (CRESTOR) 20 MG tablet TAKE 1 TABLET BY MOUTH EVERY DAY   albuterol (VENTOLIN HFA) 108 (90 Base) MCG/ACT inhaler Inhale 2 puffs into the lungs every 6 (six) hours as needed for wheezing or shortness of breath. (Patient not taking: Reported on 08/28/2022)   amoxicillin (AMOXIL) 500 MG tablet Take 4 tablets (2000 mg) by mouth ONE HOUR before any dental procedures. (Patient not taking: Reported on 08/28/2022)   ezetimibe (ZETIA) 10 MG tablet Take 1 tablet (10 mg total) by mouth daily.   metFORMIN (GLUCOPHAGE) 500 MG tablet Take 1 tablet (500 mg total) by mouth 2 (two) times daily with a meal.   rosuvastatin (CRESTOR) 20 MG tablet Take 1 tablet (20 mg total) by mouth daily.   No facility-administered encounter medications on file as of 08/28/2022.     Review of Systems  Review of  Systems  Constitutional: Negative.   HENT: Negative.    Cardiovascular: Negative.   Gastrointestinal: Negative.   Allergic/Immunologic: Negative.   Neurological: Negative.   Psychiatric/Behavioral: Negative.         Physical Exam  BP (!) 154/93   Pulse 80   Temp (!) 97.1 F (36.2 C)   Ht 5\' 9"  (1.753 m)   Wt 217 lb (98.4 kg)   SpO2 100%   BMI 32.05 kg/m   Wt Readings from Last 5 Encounters:  08/28/22 217 lb (98.4 kg)  07/27/22 216 lb 3.2 oz (98.1 kg)  05/26/22 218 lb (98.9 kg)  05/18/22 216 lb (98 kg)  04/27/22 214 lb 12.8 oz (97.4 kg)     Physical Exam Vitals and nursing note reviewed.  Constitutional:  General: He is not in acute distress.    Appearance: He is well-developed.  Cardiovascular:     Rate and Rhythm: Normal rate and regular rhythm.  Pulmonary:     Effort: Pulmonary effort is normal.     Breath sounds: Normal breath sounds.  Skin:    General: Skin is warm and dry.  Neurological:     Mental Status: He is alert and oriented to person, place, and time.      Lab Results:  CBC    Component Value Date/Time   WBC 12.2 (H) 07/27/2022 0719   RBC 4.99 07/27/2022 0719   HGB 15.7 07/27/2022 0719   HGB 16.0 05/26/2022 0908   HGB 15.8 01/01/2017 0752   HCT 44.4 07/27/2022 0719   HCT 48.7 05/26/2022 0908   HCT 46.4 01/01/2017 0752   PLT 166 07/27/2022 0719   PLT 196 05/26/2022 0908   MCV 89.0 07/27/2022 0719   MCV 93 05/26/2022 0908   MCV 91.8 01/01/2017 0752   MCH 31.5 07/27/2022 0719   MCHC 35.4 07/27/2022 0719   RDW 13.2 07/27/2022 0719   RDW 13.7 05/26/2022 0908   RDW 13.5 01/01/2017 0752   LYMPHSABS 5.3 (H) 07/27/2022 0719   LYMPHSABS 9.5 (H) 04/30/2019 1103   LYMPHSABS 9.7 (H) 01/01/2017 0752   MONOABS 1.0 07/27/2022 0719   MONOABS 1.5 (H) 01/01/2017 0752   EOSABS 0.3 07/27/2022 0719   EOSABS 0.2 04/30/2019 1103   BASOSABS 0.1 07/27/2022 0719   BASOSABS 0.1 04/30/2019 1103   BASOSABS 0.1 01/01/2017 0752    BMET     Component Value Date/Time   NA 144 05/26/2022 0908   NA 141 01/04/2015 0808   K 4.8 05/26/2022 0908   K 4.3 01/04/2015 0808   CL 105 05/26/2022 0908   CO2 23 05/26/2022 0908   CO2 21 (L) 01/04/2015 0808   GLUCOSE 135 (H) 05/26/2022 0908   GLUCOSE 102 (H) 02/09/2021 0116   GLUCOSE 115 01/04/2015 0808   BUN 16 05/26/2022 0908   BUN 18.7 01/04/2015 0808   CREATININE 1.11 05/26/2022 0908   CREATININE 1.16 03/10/2016 0830   CREATININE 0.9 01/04/2015 0808   CALCIUM 10.0 05/26/2022 0908   CALCIUM 9.0 01/04/2015 0808   GFRNONAA 56 (L) 02/09/2021 0116   GFRNONAA 70 02/11/2016 1526   GFRAA 79 04/26/2020 0941   GFRAA 81 02/11/2016 1526    BNP    Component Value Date/Time   BNP 672.5 (H) 01/24/2021 0409      Assessment & Plan:   Diabetes mellitus (HCC) - POCT glycosylated hemoglobin (Hb A1C)  2. Type 2 diabetes mellitus without complication, without long-term current use of insulin  - metFORMIN (GLUCOPHAGE) 500 MG tablet; Take 1 tablet (500 mg total) by mouth 2 (two) times daily with a meal.  Dispense: 180 tablet; Refill: 1  3. Mixed hyperlipidemia  - rosuvastatin (CRESTOR) 20 MG tablet; Take 1 tablet (20 mg total) by mouth daily.  Dispense: 90 tablet; Refill: 3   Follow up:  Follow up in 3 months     Ivonne [name], NP 08/28/2022

## 2022-08-29 ENCOUNTER — Ambulatory Visit: Payer: Medicare Other | Attending: Cardiovascular Disease | Admitting: *Deleted

## 2022-08-29 DIAGNOSIS — Z7901 Long term (current) use of anticoagulants: Secondary | ICD-10-CM | POA: Diagnosis present

## 2022-08-29 DIAGNOSIS — I482 Chronic atrial fibrillation, unspecified: Secondary | ICD-10-CM

## 2022-08-29 LAB — POCT INR: INR: 3.4 — AB (ref 2.0–3.0)

## 2022-08-29 NOTE — Patient Instructions (Signed)
Description   Do not take any warfarin tomorrow (already taken today's dose of warfarin) then continue taking warfarin 1 tablet daily except for 1/2 tablet on Sunday, Tuesday and Friday.  Recheck INR in 4 weeks. [facility]in Clinic [phone]

## 2022-08-30 ENCOUNTER — Encounter: Payer: Self-pay | Admitting: Gastroenterology

## 2022-09-05 ENCOUNTER — Ambulatory Visit (INDEPENDENT_AMBULATORY_CARE_PROVIDER_SITE_OTHER): Payer: Medicare Other | Admitting: Licensed Clinical Social Worker

## 2022-09-05 DIAGNOSIS — F339 Major depressive disorder, recurrent, unspecified: Secondary | ICD-10-CM

## 2022-09-05 NOTE — Progress Notes (Signed)
   THERAPIST PROGRESS NOTE  Session Time: 8:00am-8:55am  Participation Level: Active  Behavioral Response: Well GroomedAlertDepressed  Type of Therapy: Individual Therapy  Treatment Goals addressed: "to cope with stress and manage depression and anxiety". Knoxx will report improved stress management and coping skills, resulting in mood stability and less worrying 4 out of 7 days.       ProgressTowards Goals: Progressing  Interventions: Motivational Interviewing  Summary: Rodney Friedman is a 77 y.o. male who presents with MDD, recurrent, chronic.   Suicidal/Homicidal: Nowithout intent/plan  Therapist Response: Dailin engaged well in individual session with Facilities manager. Clinician utilized MI OARS to reflect and summarize thoughts and feelings. Clinician explored mood and interactions. Naresh shared ongoing fear and worry about what will happen to him when he is unable to care for himself. Clinician provided time and space for Topher to share his feelings. Clinician explored ways that he can make arrangements for himself prior to needing someone else to make them for him. Clinician processed challenges with finances and concerns about what will happen to his family when he is gone. Clinician discussed the use of coping skills, reaching out to friends, and asking for help when he needs some attention or affection.   Plan: Return again in 2 weeks.  Diagnosis: Major depression, recurrent, chronic  Collaboration of Care: Patient refused AEB none required in this session  Patient/Guardian was advised Release of Information must be obtained prior to any record release in order to collaborate their care with an outside provider. Patient/Guardian was advised if they have not already done so to contact the registration department to sign all necessary forms in order for [place] to release information regarding their care.   Consent: Patient/Guardian gives verbal consent for treatment and assignment of benefits  for services provided during this visit. Patient/Guardian expressed understanding and agreed to proceed.   Chryl Heck [place], LCSW 09/05/2022

## 2022-09-07 ENCOUNTER — Encounter (HOSPITAL_COMMUNITY): Payer: Self-pay | Admitting: Licensed Clinical Social Worker

## 2022-09-08 ENCOUNTER — Other Ambulatory Visit: Payer: Self-pay | Admitting: Nurse Practitioner

## 2022-09-08 DIAGNOSIS — E559 Vitamin D deficiency, unspecified: Secondary | ICD-10-CM

## 2022-09-08 NOTE — Telephone Encounter (Signed)
Please advise [facility] 

## 2022-09-19 ENCOUNTER — Encounter (HOSPITAL_COMMUNITY): Payer: Self-pay | Admitting: Licensed Clinical Social Worker

## 2022-09-19 ENCOUNTER — Ambulatory Visit (INDEPENDENT_AMBULATORY_CARE_PROVIDER_SITE_OTHER): Payer: Medicare Other | Admitting: Licensed Clinical Social Worker

## 2022-09-19 DIAGNOSIS — F339 Major depressive disorder, recurrent, unspecified: Secondary | ICD-10-CM

## 2022-09-19 NOTE — Progress Notes (Signed)
   THERAPIST PROGRESS NOTE  Session Time: 8:00am-9:00am  Participation Level: Active  Behavioral Response: CasualAlertEuthymic  Type of Therapy: Individual Therapy  Treatment Goals addressed: "to cope with stress and manage depression and anxiety". [name] will report improved stress management and coping skills, resulting in mood stability and less worrying 4 out of 7 days.       ProgressTowards Goals: Progressing  Interventions: CBT  Summary: Rodney Friedman is a 77 y.o. male who presents with MDD, recurrent, chronic.   Suicidal/Homicidal: Nowithout intent/plan  Therapist Response: Rodney Friedman engaged well in individual session with Facilities manager. Clinician utilized CBT to process thoughts, feelings, and interactions. [name] shared updates about his health and his battle with hospital billing system. Clinician explored the pros and cons of fighting these bills, noting increased stress and aggravation. [name] shared that he feels more accomplished trying, even if he fails to get his desired results, than if he just gave in and paid the bill. Clinician validated the reasoning and identified the importance and value of his ability to fight the fight. Clinician discussed mood overall and interactions with friends. [name] shared that he continues to socialize with friends from [place] and AA groups. Clinician checked in on smoking and noted that it has been 21 months without a cigarette, but he continues to feel the urge to smoke when he feels stressed or frustrated. Clinician validated this feeling and discussed alternative behaviors such as deep breathing or drinking cold water.   Plan: Return again in 2 weeks.  Diagnosis: Major depression, recurrent, chronic (HCC)  Collaboration of Care: Patient refused AEB none required  Patient/Guardian was advised Release of Information must be obtained prior to any record release in order to collaborate their care with an outside provider. Patient/Guardian was advised if  they have not already done so to contact the registration department to sign all necessary forms in order for [place] to release information regarding their care.   Consent: Patient/Guardian gives verbal consent for treatment and assignment of benefits for services provided during this visit. Patient/Guardian expressed understanding and agreed to proceed.   Chryl Heck [place], LCSW 09/19/2022

## 2022-09-25 ENCOUNTER — Ambulatory Visit: Payer: Medicare Other | Attending: Cardiovascular Disease

## 2022-09-25 DIAGNOSIS — Z7901 Long term (current) use of anticoagulants: Secondary | ICD-10-CM | POA: Insufficient documentation

## 2022-09-25 DIAGNOSIS — I482 Chronic atrial fibrillation, unspecified: Secondary | ICD-10-CM | POA: Insufficient documentation

## 2022-09-25 LAB — POCT INR: INR: 3.2 — AB (ref 2.0–3.0)

## 2022-09-25 NOTE — Patient Instructions (Signed)
Description   Only take 1/2 tablet today and then continue taking warfarin 1 tablet daily except for 1/2 tablet on Sunday, Tuesday and Friday. Stay consistent with greens each week (1-2 per week)   Recheck INR in 4 weeks.  Coumadin Clinic [phone]

## 2022-10-03 ENCOUNTER — Encounter (HOSPITAL_COMMUNITY): Payer: Self-pay | Admitting: Licensed Clinical Social Worker

## 2022-10-03 ENCOUNTER — Ambulatory Visit (INDEPENDENT_AMBULATORY_CARE_PROVIDER_SITE_OTHER): Payer: Medicare Other | Admitting: Licensed Clinical Social Worker

## 2022-10-03 DIAGNOSIS — F339 Major depressive disorder, recurrent, unspecified: Secondary | ICD-10-CM

## 2022-10-03 NOTE — Progress Notes (Signed)
   THERAPIST PROGRESS NOTE  Session Time: 8:00am-8:55am  Participation Level: Active  Behavioral Response: NeatAlertDepressed  Type of Therapy: Individual Therapy  Treatment Goals addressed: "to cope with stress and manage depression and anxiety". Nicko will report improved stress management and coping skills, resulting in mood stability and less worrying 4 out of 7 days.       ProgressTowards Goals: Progressing  Interventions: CBT  Summary: Jim Mynhier is a 78 y.o. male who presents with MDD, chronic.   Suicidal/Homicidal: Nowithout intent/plan  Therapist Response: Adonys engaged well in individual session with Facilities manager. Clinician utilized CBT to process thoughts, feelings, and interactions. Jerran shared that he has been thinking more about life and death. He shared some physical sxs associated with chronic illness, as well as past hx of cancer. Clinician identified the double-edged sword of being so on top of his medical health, as it is keeping him well and it also increases his worry and depression about getting older. 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 Clinician noted improvement in sleep and joy from recent communication from college fraternity brothers. Clinician explored daily activities and communication with friends. Clinician discussed the importance of music, as well as gratitude. Clinician encouraged Jeray to return to the old days and listen to music from his prime while he is completing these tasks, in order to lighten his mood, bring back some nostalgia, and reduce stress. Clinician also discussed gratitude as the opposite of anxiety, which may help reduce frustration.   Plan: Return again in 2 weeks.  Diagnosis: Major depression, recurrent, chronic (HCC)  Collaboration of Care: Patient refused AEB none  required  Patient/Guardian was advised Release of Information must be obtained prior to any record release in order to collaborate their care with an outside provider. Patient/Guardian was advised if they have not already done so to contact the registration department to sign all necessary forms in order for [place] to release information regarding their care.   Consent: Patient/Guardian gives verbal consent for treatment and assignment of benefits for services provided during this visit. Patient/Guardian expressed understanding and agreed to proceed.   Chryl Heck [place], LCSW 10/03/2022

## 2022-10-09 ENCOUNTER — Other Ambulatory Visit: Payer: Self-pay

## 2022-10-09 DIAGNOSIS — Z122 Encounter for screening for malignant neoplasm of respiratory organs: Secondary | ICD-10-CM

## 2022-10-09 DIAGNOSIS — Z87891 Personal history of nicotine dependence: Secondary | ICD-10-CM

## 2022-10-12 DIAGNOSIS — K5790 Diverticulosis of intestine, part unspecified, without perforation or abscess without bleeding: Secondary | ICD-10-CM | POA: Insufficient documentation

## 2022-10-17 ENCOUNTER — Ambulatory Visit (HOSPITAL_COMMUNITY): Payer: 59 | Admitting: Licensed Clinical Social Worker

## 2022-10-23 ENCOUNTER — Ambulatory Visit: Payer: Medicare Other | Attending: Cardiology

## 2022-10-23 DIAGNOSIS — I482 Chronic atrial fibrillation, unspecified: Secondary | ICD-10-CM | POA: Insufficient documentation

## 2022-10-23 DIAGNOSIS — Z7901 Long term (current) use of anticoagulants: Secondary | ICD-10-CM | POA: Diagnosis present

## 2022-10-23 LAB — POCT INR: INR: 3.7 — AB (ref 2.0–3.0)

## 2022-10-23 NOTE — Patient Instructions (Signed)
HOLD today and then continue taking warfarin 1 tablet daily except for 1/2 tablet on Sunday, Tuesday and Friday. Stay consistent with greens each week (1-2 per week)   Recheck INR in 2 weeks.  Coumadin Clinic [phone]

## 2022-10-24 ENCOUNTER — Ambulatory Visit (HOSPITAL_COMMUNITY): Payer: 59 | Admitting: Licensed Clinical Social Worker

## 2022-10-26 ENCOUNTER — Encounter: Payer: Self-pay | Admitting: Pulmonary Disease

## 2022-11-03 ENCOUNTER — Ambulatory Visit (HOSPITAL_COMMUNITY)
Admission: RE | Admit: 2022-11-03 | Discharge: 2022-11-03 | Disposition: A | Discharge status: Home / Self Care | Payer: Medicare Other | Source: Ambulatory Visit | Attending: Nurse Practitioner | Admitting: Nurse Practitioner

## 2022-11-03 ENCOUNTER — Ambulatory Visit (INDEPENDENT_AMBULATORY_CARE_PROVIDER_SITE_OTHER): Payer: Medicare Other | Admitting: Physician Assistant

## 2022-11-03 ENCOUNTER — Encounter: Payer: Self-pay | Admitting: Physician Assistant

## 2022-11-03 DIAGNOSIS — Z87891 Personal history of nicotine dependence: Secondary | ICD-10-CM

## 2022-11-03 DIAGNOSIS — Z122 Encounter for screening for malignant neoplasm of respiratory organs: Secondary | ICD-10-CM | POA: Diagnosis present

## 2022-11-03 NOTE — Progress Notes (Signed)
Virtual Visit via Telephone Note  I connected with Rodney Friedman on 11/03/22 at 9:56 AM  by telephone and verified that I am speaking with the correct person using two identifiers.  Location: Patient: home Provider: working virtually from home   I discussed the limitations, risks, security and privacy concerns of performing an evaluation and management service by telephone and the availability of in person appointments. I also discussed with the patient that there may be a patient responsible charge related to this service. The patient expressed understanding and agreed to proceed.       Shared Decision Making Visit Lung Cancer Screening Program [phone])   Eligibility: Age 77 Pack Years Smoking History Calculation 37 (# packs/per year x # years smoked) Recent History of coughing up blood  no Unexplained weight loss? no ( >Than 15 pounds within the last 6 months ) Prior History Lung / other cancer no (Diagnosis within the last 5 years already requiring surveillance chest CT Scans). Smoking Status Former Smoker Former Smokers: Years since quit: 2 years  Quit Date: 2022  Visit Components: Discussion included one or more decision making aids. yes Discussion included risk/benefits of screening. yes Discussion included potential follow up diagnostic testing for abnormal scans. yes Discussion included meaning and risk of over diagnosis. yes Discussion included meaning and risk of False Positives. yes Discussion included meaning of total radiation exposure. yes  Counseling Included: Importance of adherence to annual lung cancer LDCT screening. yes Impact of comorbidities on ability to participate in the program. yes Ability and willingness to under diagnostic treatment. yes  Smoking Cessation Counseling: Former Smokers:  Discussed the importance of maintaining cigarette abstinence. yes Diagnosis Code: Personal History of Nicotine Dependence. U04.540 Information about  tobacco cessation classes and interventions provided to patient. Yes Written Order for Lung Cancer Screening with LDCT placed in Epic. Yes (CT Chest Lung Cancer Screening Low Dose W/O CM) [id number] Z12.2-Screening of respiratory organs Z87.891-Personal history of nicotine dependence    I spent 25 minutes of face to face time/virtual visit time  with the patient discussing the risks and benefits of lung cancer screening. We took the time to pause at intervals to allow for questions to be asked and answered to ensure understanding. We discussed that they had taken the single most powerful action possible to decrease their risk of developing lung cancer when they quit smoking. I counseled them to remain smoke free, and to contact the office if they ever had the desire to smoke again so that we can provide resources and tools to help support the effort to remain smoke free. We discussed the time and location of the scan, and they  will receive a call or letter with the results within  24-72 hours of receiving them. They have the office contact information in the event they have questions.   They verbalized understanding of all of the above and had no further questions.    I explained to the patient that there has been a high incidence of coronary artery disease noted on these exams. I explained that this is a non-gated exam therefore degree or severity cannot be determined. This patient is not on statin therapy. I have asked the patient to follow-up with their PCP regarding any incidental finding of coronary artery disease and management with diet or medication as they feel is clinically indicated. The patient verbalized understanding of the above and had no further questions.      Rodney Gasman Ladaja Yusupov, PA-C

## 2022-11-03 NOTE — Patient Instructions (Signed)
Thank you for participating in the [facility]. It was our pleasure to meet you today. We will call you with the results of your scan within the next few days. Your scan will be assigned a Lung RADS category score by the physicians reading the scans.  This Lung RADS score determines follow up scanning.  See below for description of categories, and follow up screening recommendations. We will be in touch to schedule your follow up screening annually or based on recommendations of our providers. We will fax a copy of your scan results to your Primary Care Physician, or the physician who referred you to the program, to ensure they have the results. Please call the office if you have any questions or concerns regarding your scanning experience or results.  Our office number is [phone]. Please speak with Denise Phelps, RN. , or  Denise Buckner RN, They are  our Lung Cancer Screening RN.'s If They are unavailable when you call, Please leave a message on the voice mail. We will return your call at our earliest convenience.This voice mail is monitored several times a day.  Remember, if your scan is normal, we will scan you annually as long as you continue to meet the criteria for the program. (Age 50-80, Current smoker or smoker who has quit within the last 15 years). If you are a smoker, remember, quitting is the single most powerful action that you can take to decrease your risk of lung cancer and other pulmonary, breathing related problems. We know quitting is hard, and we are here to help.  Please let us know if there is anything we can do to help you meet your goal of quitting. If you are a former smoker, congratulations. We are proud of you! Remain smoke free! Remember you can refer friends or family members through the number above.  We will screen them to make sure they meet criteria for the program. Thank you for helping us take better care of you by  participating in Lung Screening.  You can receive free nicotine replacement therapy ( patches, gum or mints) by calling 1-800-QUIT NOW. Please call so we can get you on the path to becoming  a non-smoker. I know it is hard, but you can do this!  Lung RADS Categories:  Lung RADS 1: no nodules or definitely non-concerning nodules.  Recommendation is for a repeat annual scan in 12 months.  Lung RADS 2:  nodules that are non-concerning in appearance and behavior with a very low likelihood of becoming an active cancer. Recommendation is for a repeat annual scan in 12 months.  Lung RADS 3: nodules that are probably non-concerning , includes nodules with a low likelihood of becoming an active cancer.  Recommendation is for a 6-month repeat screening scan. Often noted after an upper respiratory illness. We will be in touch to make sure you have no questions, and to schedule your 6-month scan.  Lung RADS 4 A: nodules with concerning findings, recommendation is most often for a follow up scan in 3 months or additional testing based on our provider's assessment of the scan. We will be in touch to make sure you have no questions and to schedule the recommended 3 month follow up scan.  Lung RADS 4 B:  indicates findings that are concerning. We will be in touch with you to schedule additional diagnostic testing based on our provider's  assessment of the scan.  Other options for assistance in smoking cessation (   As covered by your insurance benefits)  Hypnosis for smoking cessation  [facility] [employer] [phone]  Acupuncture for smoking cessation  [facility] [phone]   

## 2022-11-06 ENCOUNTER — Encounter: Payer: Self-pay | Admitting: Pulmonary Disease

## 2022-11-06 NOTE — Telephone Encounter (Signed)
Message received this morning from patient.     Two corrections I do have coronary artery disease 20 years and I have and am now on statins . Dr Royann Shivers card rx. Gleason was very excellent interview, thorough and I was comfortable.     Message routed to Dr. Francine Graven as Lorain Childes

## 2022-11-07 ENCOUNTER — Ambulatory Visit (INDEPENDENT_AMBULATORY_CARE_PROVIDER_SITE_OTHER): Payer: Medicare Other | Admitting: Licensed Clinical Social Worker

## 2022-11-07 DIAGNOSIS — F339 Major depressive disorder, recurrent, unspecified: Secondary | ICD-10-CM | POA: Diagnosis not present

## 2022-11-08 ENCOUNTER — Ambulatory Visit: Payer: Medicare Other | Attending: Cardiovascular Disease | Admitting: Cardiovascular Disease

## 2022-11-08 ENCOUNTER — Encounter: Payer: Self-pay | Admitting: Cardiovascular Disease

## 2022-11-08 ENCOUNTER — Other Ambulatory Visit: Payer: Self-pay

## 2022-11-08 ENCOUNTER — Encounter: Payer: Self-pay | Admitting: Pulmonary Disease

## 2022-11-08 ENCOUNTER — Ambulatory Visit (INDEPENDENT_AMBULATORY_CARE_PROVIDER_SITE_OTHER): Payer: Medicare Other | Admitting: *Deleted

## 2022-11-08 VITALS — BP 130/90 | HR 68 | Ht 69.0 in | Wt 214.4 lb

## 2022-11-08 DIAGNOSIS — Z7901 Long term (current) use of anticoagulants: Secondary | ICD-10-CM | POA: Diagnosis present

## 2022-11-08 DIAGNOSIS — I482 Chronic atrial fibrillation, unspecified: Secondary | ICD-10-CM | POA: Diagnosis present

## 2022-11-08 DIAGNOSIS — E785 Hyperlipidemia, unspecified: Secondary | ICD-10-CM

## 2022-11-08 DIAGNOSIS — I10 Essential (primary) hypertension: Secondary | ICD-10-CM | POA: Diagnosis present

## 2022-11-08 DIAGNOSIS — I340 Nonrheumatic mitral (valve) insufficiency: Secondary | ICD-10-CM

## 2022-11-08 DIAGNOSIS — Z87891 Personal history of nicotine dependence: Secondary | ICD-10-CM

## 2022-11-08 DIAGNOSIS — I5032 Chronic diastolic (congestive) heart failure: Secondary | ICD-10-CM

## 2022-11-08 DIAGNOSIS — I25708 Atherosclerosis of coronary artery bypass graft(s), unspecified, with other forms of angina pectoris: Secondary | ICD-10-CM

## 2022-11-08 DIAGNOSIS — Z952 Presence of prosthetic heart valve: Secondary | ICD-10-CM | POA: Diagnosis present

## 2022-11-08 DIAGNOSIS — J432 Centrilobular emphysema: Secondary | ICD-10-CM

## 2022-11-08 DIAGNOSIS — D6869 Other thrombophilia: Secondary | ICD-10-CM | POA: Diagnosis present

## 2022-11-08 DIAGNOSIS — Z122 Encounter for screening for malignant neoplasm of respiratory organs: Secondary | ICD-10-CM

## 2022-11-08 LAB — POCT INR: INR: 4.4 — AB (ref 2.0–3.0)

## 2022-11-08 NOTE — Patient Instructions (Addendum)
Medication Instructions:  No changes *If you need a refill on your cardiac medications before your next appointment, please call your pharmacy*   Follow-Up: At [facility], you and your health needs are our priority.  As part of our continuing mission to provide you with exceptional heart care, we have created designated Provider Care Teams.  These Care Teams include your primary Cardiologist (physician) and Advanced Practice Providers (APPs -  Physician Assistants and Nurse Practitioners) who all work together to provide you with the care you need, when you need it.  We recommend signing up for the patient portal called "MyChart".  Sign up information is provided on this After Visit Summary.  MyChart is used to connect with patients for Virtual Visits (Telemedicine).  Patients are able to view lab/test results, encounter notes, upcoming appointments, etc.  Non-urgent messages can be sent to your provider as well.   To learn more about what you can do with MyChart, go to [email or web address].    Your next appointment:   6 month(s)  Provider:   Mihai Croitoru, MD     

## 2022-11-08 NOTE — Progress Notes (Signed)
Cardiology Office Note    Date:  11/08/2022   ID:  Rodney Friedman, DOB [birth year], MRN [id number]  PCP:  Rodney [name], NP  Cardiologist:   Rodney Fair, MD   chief complaint: CAD, AFib, AS, HTN, HLP   History of Present Illness:  Shown Rodney Friedman is a 77 y.o. male with CAD and remote CABG (three-vessel, March 2006; LIMA to LAD , SVG to diagonal and SVG to distal OM, all patent by coronary angiography 01/20/2021), longstanding persistent atrial fibrillation, aortic stenosis s/p TAVR (26 mm Edwards SAPIEN 3 ultra THV September 2022), severe mitral annular calcification with mitral regurgitation (presumably ischemic mechanism), persistent atrial fibrillation, carotid artery stenosis, hypertension, hyperlipidemia and type 2 diabetes mellitus.  Currently is doing well although he does have some shortness of breath with greater than usual activity ("when I push it").  He has seen Dr. Francine Friedman and has been told that he does have some emphysema/COPD.  Hard to tell whether his dyspnea is cardiac or pulmonary nature just based on his report.  He is clinically euvolemic and does not have any problems with orthopnea, PND or lower extremity edema.  He is not taking any diuretics.  He denies claudication.  He has not had any falls, injuries, serious bleeding and is compliant with warfarin monitoring.  The last 3 times his INR has been a little high: 3.2-3.7.  It is now been almost 2 years since he quit smoking and he has managed to stay away from cigarettes.  His pre-TAVR echocardiogram also showed what appeared to be severe mitral regurgitation with an ischemic mechanism (restricted posterior leaflet motion) as well as severe MAC, not a good candidate for MitraClip.  The immediate follow-up echocardiogram showed normal function of the TAVR (mean gradient 7 mmHg, no insufficiency) and also showed that the mitral regurgitation is less significant, only moderate, following correction of the aortic  stenosis.  LVEF is 50-55%, with wall motion abnormalities in the inferior wall.  The most recent echocardiogram performed 01/27/2022 showed LVEF 50%, LVH, normally functioning TAVR prosthesis with a mean gradient of 7 mmHg and no aortic insufficiency.  Mitral insufficiency was mild.  He has significant mitral annulus calcification.  Probably has permanent atrial fibrillation.  None of his EKGs have documented sinus rhythm since September 10, 2019.  There are some duplicate prescriptions on his list.  He is taking metoprolol succinate, not metoprolol tartrate.  He is taking rosuvastatin, not pravastatin.  He has not had focal neurological events, dizziness, syncope, palpitations.  He has never had a stroke or embolic event.  Most recent hemoglobin A1c was 6.9% in April, most recent LDL cholesterol was 42 (rosuvastatin plus ezetimibe).  His prostate cancer is currently considered to be in remission.  He also sees Dr. Emeline Friedman such for CLL but his most recent WBC was actually lower at 12 K (5K lymphocytes).  He does not have anemia or thrombocytopenia.   Rodney Friedman's father passed away in 23-Nov-2019 at age 24.  He was very dedicated to his father's care and misses him.  However, his level of stress has gone down tremendously. He  is well versed in the theoretical aspects of addiction since he has a Masters degree on the topic.  He has a history of coronary disease and underwent 3 vessel bypass surgery in [place] in 2004-11-22 ("heartburn" and left antecubital pain ).  All his bypass grafts were open by angiography in September 2022.  He underwent TAVR for severe aortic stenosis shortly thereafter (  Edwards S3 U 26 mm).  He has had paroxysmal atrial fibrillation with RVR off and on, generally asymptomatic.Marland Kitchen His nuclear stress test in June 2013 showed normal pattern of perfusion and an ejection fraction of 52%. He has bilateral carotid bruits that are reportedly due to external carotid artery stenoses. His last carotid ultrasound was  performed in September 2022 and showed no significant internal carotid stenosis.   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 He has treated prostate cancer and is followed by Dr. Bertis Friedman.  He has a long history of alcohol and drug use but has been sober for over 15 years and still goes to [employer]  He finally successfully quit smoking permanently in 2022, around the time of his TAVR.  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 Clifton Friedman & Dr. Laneta Friedman   Sleep apnea    Substance abuse (HCC)    Tremor, essential 09/22/2015    Past Surgical History:  Procedure Laterality Date   Arm Surgery Right    from door accident with glass   CARDIAC CATHETERIZATION     CATARACT EXTRACTION W/ INTRAOCULAR LENS IMPLANT Bilateral    COLONOSCOPY     CORONARY ARTERY BYPASS GRAFT  08/04/2004   EYE SURGERY     POLYPECTOMY     RIGHT/LEFT HEART  CATH AND CORONARY/GRAFT ANGIOGRAPHY N/A 01/20/2021   Procedure: RIGHT/LEFT HEART CATH AND CORONARY/GRAFT ANGIOGRAPHY;  Surgeon: Rodney Hazel, MD;  Location: [facility] INVASIVE CV LAB;  Service: Cardiovascular;  Laterality: N/A;   TEE WITHOUT CARDIOVERSION N/A 02/08/2021   Procedure: TRANSESOPHAGEAL ECHOCARDIOGRAM (TEE);  Surgeon: Rodney Hazel, MD;  Location: [facility] INVASIVE CV LAB;  Service: Open Heart Surgery;  Laterality: N/A;   TRANSCATHETER AORTIC VALVE REPLACEMENT, CAROTID Left 02/08/2021   Procedure: TRANSCATHETER AORTIC VALVE REPLACEMENT, LEFT CAROTID;  Surgeon: Rodney Hazel, MD;  Location: [facility] INVASIVE CV LAB;  Service: Open Heart Surgery;  Laterality: Left;    Current Medications: Outpatient Medications Prior to Visit  Medication Sig Dispense Refill   albuterol (VENTOLIN HFA) 108 (90 Base) MCG/ACT inhaler Inhale 2 puffs into the lungs every 6 (six) hours as needed for wheezing or shortness of breath. 8 g 11   amoxicillin (  AMOXIL) 500 MG tablet Take 4 tablets (2000 mg) by mouth ONE HOUR before any dental procedures. 12 tablet 4   ascorbic acid (VITAMIN C) 1000 MG tablet Take 1,000 mg by mouth daily.     B Complex Vitamins (VITAMIN-B COMPLEX PO) Take 1 tablet by mouth daily with supper.     BIOTIN PO Take 1 tablet by mouth every evening.     buPROPion (WELLBUTRIN SR) 200 MG 12 hr tablet Take 1 tablet (200 mg total) by mouth every morning. 90 tablet 2   buPROPion ER (WELLBUTRIN SR) 100 MG 12 hr tablet 1 qhs 90 tablet 4   Calcium Carb-Cholecalciferol 500-10 MG-MCG TABS Take 1 tablet by mouth daily in the afternoon.     Calcium Carbonate-Vit D-Min (CALCIUM 1200 PO) Take 1,200 mg by mouth every evening.     calcium elemental as carbonate (TUMS ULTRA 1000) 400 MG chewable tablet Chew 1,000 mg by mouth daily as needed for heartburn.     Cholecalciferol 1.25 MG (50000 UT) capsule Take 50,000 Units by mouth. Every other Saturday     docusate sodium (COLACE) 100 MG capsule Take  100 mg by mouth every evening.     ezetimibe (ZETIA) 10 MG tablet Take 1 tablet (10 mg total) by mouth daily. 90 tablet 3   gabapentin (NEURONTIN) 100 MG capsule 3  qhs 270 capsule 1   ketoconazole (NIZORAL) 2 % shampoo Apply 1 application topically 2 (two) times a week. 120 mL 3   latanoprost (XALATAN) 0.005 % ophthalmic solution Place 1 drop into the left eye every other day.     LYCOPENE PO Take 10 mg by mouth every other day.     Magnesium 250 MG TABS Take 250 mg by mouth every 3 (three) days.     metFORMIN (GLUCOPHAGE) 500 MG tablet Take 1 tablet (500 mg total) by mouth 2 (two) times daily with a meal. 180 tablet 1   metoprolol succinate (TOPROL-XL) 50 MG 24 hr tablet Take 1 tablet (50 mg total) by mouth 2 (two) times daily. Take with or immediately following a meal. 180 tablet 1   niacin (VITAMIN B3) 500 MG tablet Take 500 mg by mouth 3 (three) times a week.     Omega-3 Fatty Acids (FISH OIL) 1000 MG CAPS Take 1,000 mg by mouth daily with supper.     prednisoLONE acetate (PRED FORTE) 1 % ophthalmic suspension Place 1 drop into both eyes. Every other day     rosuvastatin (CRESTOR) 20 MG tablet Take 1 tablet (20 mg total) by mouth daily. 90 tablet 3   Sodium Fluoride (PREVIDENT 5000 DRY MOUTH DT) Place 1 application onto teeth See admin instructions. Apply to gums every night for dry mouth     vitamin B-12 (CYANOCOBALAMIN) 500 MCG tablet Take 500 mcg by mouth every other day.     vitamin C (ASCORBIC ACID) 500 MG tablet Take 500-1,000 mg by mouth See admin instructions. Alternate taking 500 mg one day and 1000 mg the next     Vitamin D, Ergocalciferol, (DRISDOL) 1.25 MG (50000 UNIT) CAPS capsule TAKE 1 CAPSULE (50,000 UNITS TOTAL) BY MOUTH EVERY SATURDAY. 4 capsule 2   warfarin (COUMADIN) 4 MG tablet TAKE 1/2 TO 1 TABLET DAILY AS DIRECTED BY COUMADIN CLINIC 90 tablet 1   metoprolol tartrate (LOPRESSOR) 100 MG tablet Take 100 mg by mouth 2 (two) times daily.     pravastatin (PRAVACHOL) 80 MG  tablet Take 80 mg by mouth daily.  No facility-administered medications prior to visit.     Allergies:   Other and Poison ivy extract   Social History   Socioeconomic History   Marital status: Divorced    Spouse name: Not on file   Number of children: 2   Years of education: 16   Highest education level: Not on file  Occupational History   Occupation: Retired Runner, broadcasting/film/video  Tobacco Use   Smoking status: Former    Packs/day: 0.75    Years: 59.00    Additional pack years: 0.00    Total pack years: 44.25    Types: Cigarettes    Quit date: 12/16/2020    Years since quitting: 1.8   Smokeless tobacco: Never  Vaping Use   Vaping Use: Never used  Substance and Sexual Activity   Alcohol use: No    Alcohol/week: 0.0 standard drinks of alcohol   Drug use: No   Sexual activity: Not Currently  Other Topics Concern   Not on file  Social History Narrative   Lives alone   Divorced   Right-handed   Education: College   No caffeine   Social Determinants of Health   Financial Resource Strain: Low Risk  (05/18/2022)   Overall Financial Resource Strain (CARDIA)    Difficulty of Paying Living Expenses: Not hard at all  Food Insecurity: No Food Insecurity (05/18/2022)   Hunger Vital Sign    Worried About Running Out of Food in the Last Year: Never true    Ran Out of Food in the Last Year: Never true  Transportation Needs: No Transportation Needs (05/18/2022)   PRAPARE - Administrator, Civil Service (Medical): No    Lack of Transportation (Non-Medical): No  Physical Activity: Insufficiently Active (05/18/2022)   Exercise Vital Sign    Days of Exercise per Week: 5 days    Minutes of Exercise per Session: 20 min  Stress: Stress Concern Present (05/18/2022)   [employer] of Occupational Health - Occupational Stress Questionnaire    Feeling of Stress : To some extent  Social Connections: Moderately Integrated (05/18/2022)   Social Connection and Isolation Panel  [NHANES]    Frequency of Communication with Friends and Family: More than three times a week    Frequency of Social Gatherings with Friends and Family: More than three times a week    Attends Religious Services: More than 4 times per year    Active Member of [employer] or Organizations: Yes    Attends Engineer, structural: More than 4 times per year    Marital Status: Divorced     Family History:  The patient's family history includes Colon cancer in his mother; Hypertension in his father; Ovarian cancer in his mother; Prostate cancer in his father; Uterine cancer in his mother.   ROS:   Please see the history of present illness.   All other systems are reviewed and are negative.   PHYSICAL EXAM:   VS:  BP (!) 130/90 (BP Location: Left Arm, Patient Position: Sitting, Cuff Size: Large)   Pulse 68   Ht 5\' 9"  (1.753 m)   Wt 214 lb 6.4 oz (97.3 kg)   SpO2 97%   BMI 31.66 kg/m      General: Alert, oriented x3, no distress, appears well Head: no evidence of trauma, PERRL, EOMI, no exophtalmos or lid lag, no myxedema, no xanthelasma; normal ears, nose and oropharynx Neck: normal jugular venous pulsations and no hepatojugular reflux; brisk carotid pulses without delay and no  carotid bruits Chest: clear to auscultation, no signs of consolidation by percussion or palpation, normal fremitus, symmetrical and full respiratory excursions Cardiovascular: normal position and quality of the apical impulse, irregular rhythm, normal first and second heart sounds, 1-2/6 aortic ejection murmur early peaking, no diastolic murmurs, rubs or gallops Abdomen: no tenderness or distention, no masses by palpation, no abnormal pulsatility or arterial bruits, normal bowel sounds, no hepatosplenomegaly Extremities: no clubbing, cyanosis or edema; 2+ radial, ulnar and brachial pulses bilaterally; 2+ right femoral, posterior tibial and dorsalis pedis pulses; 2+ left femoral, posterior tibial and dorsalis pedis  pulses; no subclavian or femoral bruits Neurological: grossly nonfocal Psych: Normal mood and affect    Wt Readings from Last 3 Encounters:  11/08/22 214 lb 6.4 oz (97.3 kg)  08/28/22 217 lb (98.4 kg)  07/27/22 216 lb 3.2 oz (98.1 kg)      Studies/Labs Reviewed:   ECHO 01/27/2022    1. Severe hypertrophy of the basal septum with otherwise mild concentric  LVH. Inferior and inferoseptal hypokinesis. Left ventricular ejection  fraction, by estimation, is 50 to 55%. The left ventricle has low normal  function. The left ventricle  demonstrates regional wall motion abnormalities (see scoring  diagram/findings for description). There is severe left ventricular  hypertrophy of the basal-septal segment. Left ventricular diastolic  function could not be evaluated.   2. Right ventricular systolic function is normal. The right ventricular  size is normal. There is normal pulmonary artery systolic pressure.   3. Left atrial size was severely dilated.   4. The mitral valve is degenerative. Mild mitral valve regurgitation. No  evidence of mitral stenosis. Moderate mitral annular calcification.   5. TAVR gradients unchanged from 02/2021. The aortic valve has been  repaired/replaced. Aortic valve regurgitation is not visualized. No aortic  stenosis is present. Echo findings are consistent with normal structure  and function of the aortic valve  prosthesis. Aortic valve mean gradient measures 7.3 mmHg. Aortic valve  Vmax measures 1.95 m/s.   6. The inferior vena cava is normal in size with greater than 50%  respiratory variability, suggesting right atrial pressure of 3 mmHg.   AV Vmax:           195.00 cm/s  AV VTI:            0.315 m  AV Peak Grad:      15.2 mmHg  AV Mean Grad:      7.3 mmHg  LVOT VTI:          0.221 m  LVOT/AV VTI ratio: 0.70   EKG:  EKG is ordered today and personally reviewed.  It is very similar to previous tracing showing A-fib with good rate control, LVH with  broadening of the QRS not quite meeting criteria for LBBB and secondary repolarization abnormalities, left axis deviation.    Recent Labs: 05/26/2022: ALT 32; BUN 16; Creatinine, Ser 1.11; Potassium 4.8; Sodium 144 07/27/2022: Hemoglobin 15.7; Platelets 166 K; WBC 14.6 K Hemoglobin A1c 7.3%  Lipid Panel    Component Value Date/Time   CHOL 99 (L) 05/26/2022 0908   TRIG 129 05/26/2022 0908   HDL 34 (L) 05/26/2022 0908   CHOLHDL 2.9 05/26/2022 0908   CHOLHDL 5.8 (Friedman) 03/10/2016 0830   VLDL 41 (Friedman) 03/10/2016 0830   LDLCALC 42 05/26/2022 0908    ASSESSMENT:    1. Chronic atrial fibrillation (HCC)   2. S/P TAVR (transcatheter aortic valve replacement)   3. Non-rheumatic mitral regurgitation   4. Acquired  thrombophilia (HCC)   5. Chronic diastolic CHF (congestive heart failure) (HCC)   6. Coronary artery disease of bypass graft of native heart with stable angina pectoris (HCC)   7. Dyslipidemia (high LDL; low HDL)   8. Essential hypertension   9. Centrilobular emphysema (HCC)      PLAN:  In order of problems listed above:  AFib: Rate controlled, asymptomatic, probably permanent.  Sinus rhythm has not been documented since April 2021.  He is on anticoagulants and has good ventricular rate control.  He has a severely dilated left atrium which makes it less likely that we will be successful long-term maintenance of normal rhythm even after cardioversion. CHADSVasc 4 (age, HTN, CAD, DM).  AS s/p TAVR: Normally functioning prosthesis, good functional status.  Recent echo September 2023 with favorable findings.  Needs endocarditis prophylaxis with dental procedures. MR: Had severe MR with a pattern suggestive of ischemic mechanism before TAVR, but now the MR is only mild.  I do not hear a murmur of mitral regurgitation on physical exam. Warfarin: Usually in therapeutic range, recently a little supratherapeutic.  No bleeding problems. CHF: NYHA functional class I-2 (unclear if his dyspnea is  pulmonary or cardiac in etiology, but it is quite mild and only occurs during greater than usual physical activity)..  Euvolemic.  Has not required diuretics since the TAVR procedure.  Borderline low LVEF 50% has been stable for several years. CAD s/p CABG: Asymptomatic, only antianginal is the beta-blocker.  All 3 bypass grafts remain patent by cardiac catheterization in 2022.  Not on aspirin due to full anticoagulation for atrial fibrillation. HLP: On high-dose rosuvastatin plus ezetimibe.  Chronically low HDL, but excellent LDL cholesterol. DM: Good glycemic control on metformin monotherapy. HTN: Diastolic blood pressure borderline.  No changes made to his medications.  Avoid salty foods. COPD: Has not smoked in almost 2 years.  He had recent PFTs which actually showed very good parameters.  There is evidence of mild centrilobular emphysema on imaging studies.  Has had low-dose CT to screen for lung cancer. Carotid bruits: Carotid ultrasound in September 2021 shows only mild bilateral plaque in internal carotids.  He has persistent carotid bruits from external carotid disease.  I do not think that he requires routine monitoring. OSA: Intolerant of CPAP, but after weight loss he has no longer complained of daytime hypersomnolence. Obesity: Remains mildly obese, but weight stable and well below his peak weight a couple of years ago. CLL: Relatively low WBC at about 12000-14000 without anemia or thrombocytopenia.  Followed by Dr. Bertis Friedman. Prostate Ca: PSA barely detectable.  Had initial treatment with radiation therapy several years ago and then last year a few sessions of radiation therapy for recurrent lymphadenopathy in the pelvis.  On Lupron which he tolerates well.    Medication Adjustments/Labs and Tests Ordered: Current medicines are reviewed at length with the patient today.  Concerns regarding medicines are outlined above.  Medication changes, Labs and Tests ordered today are listed in the  Patient Instructions below. Patient Instructions  Medication Instructions:  No changes *If you need a refill on your cardiac medications before your next appointment, please call your pharmacy*  Follow-Up: At [facility], you and your health needs are our priority.  As part of our continuing mission to provide you with exceptional heart care, we have created designated Provider Care Teams.  These Care Teams include your primary Cardiologist (physician) and Advanced Practice Providers (APPs -  Physician Assistants and Nurse Practitioners) who all  work together to provide you with the care you need, when you need it.  We recommend signing up for the patient portal called "MyChart".  Sign up information is provided on this After Visit Summary.  MyChart is used to connect with patients for Virtual Visits (Telemedicine).  Patients are able to view lab/test results, encounter notes, upcoming appointments, etc.  Non-urgent messages can be sent to your provider as well.   To learn more about what you can do with MyChart, go to [email or web address].    Your next appointment:   6 months  Provider:   Thurmon Fair, MD       Signed, Rodney Fair, MD  11/08/2022 4:40 PM    [facility] [address] [place], [place], [place]  [address] Phone: [phone]; Fax: [phone]

## 2022-11-08 NOTE — Patient Instructions (Addendum)
Description   Do not take any warfarin tomorrow (already taken today's dose) then START taking warfarin 1/2 tablet daily except for 1 tablet on Monday, Wednesday, and Friday.  Stay consistent with greens each week (start eating them 2-3 per week)   Recheck INR in 3 weeks.  Coumadin Clinic [phone]

## 2022-11-08 NOTE — Telephone Encounter (Signed)
He needs a refill on the metoprolol succinate and we need to delete the metoprolol tartrate from his med list please.

## 2022-11-09 ENCOUNTER — Encounter (HOSPITAL_COMMUNITY): Payer: Self-pay | Admitting: Licensed Clinical Social Worker

## 2022-11-09 NOTE — Progress Notes (Signed)
   THERAPIST PROGRESS NOTE  Session Time: 8:00am-9:00am  Participation Level: Active  Behavioral Response: Well GroomedAlertDepressed  Type of Therapy: Individual Therapy  Treatment Goals addressed:  "to cope with stress and manage depression and anxiety". Abdulhamid will report improved stress management and coping skills, resulting in mood stability and less worrying 4 out of 7 days.       ProgressTowards Goals: Progressing  Interventions: CBT  Summary: Thelbert Holsten is a 77 y.o. male who presents with MDD, recurrent, chronic.   Suicidal/Homicidal: Nowithout intent/plan  Therapist Response: Kayen engaged well in individual session with Facilities manager. Clinician utilized CBT to process thoughts, feelings and behaviors related to increased depressed mood over the past few weeks. Clinician explored triggers to this depression, the stress of having to take care of himself and his house by himself, more technological issues, and fears about his future. Clinician utilized CBT to explore thought processes, noting that his anxiety and depression is increasing because he is so fixated on his preparation for the future. Clinician encouraged Kimoni to increase mindfulness and gratitude on a daily basis in order to reduce the anxiety he feels about the unknown. Clinician also reminded Kristopher about deep breathing and focusing on facts.   Plan: Return again in 2 weeks.  Diagnosis: Major depression, recurrent, chronic (HCC)  Collaboration of Care: Patient refused AEB none required. Encouraged Pascual to talk to Dr. Donell Beers about depression at next appt.  Patient/Guardian was advised Release of Information must be obtained prior to any record release in order to collaborate their care with an outside provider. Patient/Guardian was advised if they have not already done so to contact the registration department to sign all necessary forms in order for [place] to release information regarding their care.   Consent:  Patient/Guardian gives verbal consent for treatment and assignment of benefits for services provided during this visit. Patient/Guardian expressed understanding and agreed to proceed.   Chryl Heck [place], LCSW 11/09/2022

## 2022-11-10 NOTE — Telephone Encounter (Signed)
FYI for Dr. Francine Graven- pt seen by cards 11/08/22 and would like you to review this note when you have a chance. Thanks!  Pt states:  Kindly review my visit report of today 11/08/22 as lung and heart function have related issues but seems no immediate treatment needed presently, no change in meds

## 2022-11-10 NOTE — Telephone Encounter (Signed)
Patient requested CT results.  Patient had lung screening CT 11/03/22.  CT results were posted by Angus Seller, NP 11/08/22 in epic.    Request routed to lung nodule pool to advise   Ivonne [name], NP 11/08/2022  1:21 PM EDT     No suspicious pulmonary nodule or mass noted with lung cancer screening. Continue to follow with cardiology and pulmonary.

## 2022-11-27 ENCOUNTER — Ambulatory Visit (INDEPENDENT_AMBULATORY_CARE_PROVIDER_SITE_OTHER): Payer: Medicare Other | Admitting: Nurse Practitioner

## 2022-11-27 ENCOUNTER — Encounter: Payer: Self-pay | Admitting: Nurse Practitioner

## 2022-11-27 VITALS — BP 124/84 | HR 87 | Temp 97.1°F | Ht 68.0 in | Wt 213.6 lb

## 2022-11-27 DIAGNOSIS — I10 Essential (primary) hypertension: Secondary | ICD-10-CM

## 2022-11-27 DIAGNOSIS — E119 Type 2 diabetes mellitus without complications: Secondary | ICD-10-CM

## 2022-11-27 DIAGNOSIS — E782 Mixed hyperlipidemia: Secondary | ICD-10-CM

## 2022-11-27 DIAGNOSIS — J432 Centrilobular emphysema: Secondary | ICD-10-CM

## 2022-11-27 DIAGNOSIS — E559 Vitamin D deficiency, unspecified: Secondary | ICD-10-CM | POA: Diagnosis not present

## 2022-11-27 DIAGNOSIS — E1169 Type 2 diabetes mellitus with other specified complication: Secondary | ICD-10-CM

## 2022-11-27 DIAGNOSIS — C61 Malignant neoplasm of prostate: Secondary | ICD-10-CM | POA: Diagnosis not present

## 2022-11-27 LAB — POCT GLYCOSYLATED HEMOGLOBIN (HGB A1C): Hemoglobin A1C: 6.9 % — AB (ref 4.0–5.6)

## 2022-11-27 MED ORDER — ALBUTEROL SULFATE HFA 108 (90 BASE) MCG/ACT IN AERS
2.0000 | INHALATION_SPRAY | Freq: Four times a day (QID) | RESPIRATORY_TRACT | 11 refills | Status: DC | PRN
Start: 2022-11-27 — End: 2023-01-03

## 2022-11-27 MED ORDER — VITAMIN D (ERGOCALCIFEROL) 1.25 MG (50000 UNIT) PO CAPS
50000.0000 [IU] | ORAL_CAPSULE | ORAL | 2 refills | Status: DC
Start: 2022-12-02 — End: 2023-01-05

## 2022-11-27 MED ORDER — DOCUSATE SODIUM 100 MG PO CAPS: 100.0000 mg | ORAL_CAPSULE | Freq: Every evening | ORAL | 3 refills | Status: DC

## 2022-11-27 MED ORDER — VITAMIN B-12 500 MCG PO TABS: 500.0000 ug | ORAL_TABLET | ORAL | 2 refills | Status: DC

## 2022-11-27 MED ORDER — ROSUVASTATIN CALCIUM 20 MG PO TABS: 20.0000 mg | ORAL_TABLET | Freq: Every day | ORAL | 3 refills | Status: DC

## 2022-11-27 MED ORDER — VITAMIN C 500 MG PO TABS: 500.0000 mg | ORAL_TABLET | ORAL | 2 refills | Status: DC

## 2022-11-27 MED ORDER — METOPROLOL SUCCINATE ER 50 MG PO TB24: 50.0000 mg | ORAL_TABLET | Freq: Two times a day (BID) | ORAL | 1 refills | Status: DC

## 2022-11-27 MED ORDER — EZETIMIBE 10 MG PO TABS: 10.0000 mg | ORAL_TABLET | Freq: Every day | ORAL | 3 refills | Status: DC

## 2022-11-27 MED ORDER — METFORMIN HCL 500 MG PO TABS: 500.0000 mg | ORAL_TABLET | Freq: Two times a day (BID) | ORAL | 1 refills | Status: DC

## 2022-11-27 NOTE — Patient Instructions (Addendum)
1. Type 2 diabetes mellitus with other specified complication, without long-term current use of insulin (HCC)  - POCT glycosylated hemoglobin (Hb A1C) - Ambulatory referral to Podiatry - CBC - Comprehensive metabolic panel   2. Vitamin D deficiency  - Vitamin D, 25-hydroxy   3. Prostate cancer (HCC)  - PSA   Follow up:  Follow up in 3 months

## 2022-11-27 NOTE — Progress Notes (Signed)
@Patient  ID: Rodney Friedman, male    DOB: April 16, 1946, 76 y.o.   MRN: [id number]  Chief Complaint  Patient presents with   Diabetes    Follow up    Referring provider: Ivonne [name], NP   HPI  77 year old male with history of atrial fibrillation, hypertensive heart disease CAD, OSA, diabetes, tremor, central serous chorioretinopathy,, prostate cancer metastatic to intrapelvic lymph node, glaucoma, hyperlipidemia, cataracts.   Patient presents today for routine follow-up visit.  Overall he has been stable since his last visit here.  Patient is following with cardiology and Coumadin clinic.  He is following with urology to monitor elevated PSA level.  He has followed with oncology.  Patient does have a history of smoking but no longer smokes. A1C in office today is 6.9.  Patient does need referral to podiatry for diabetic footcare.  Patient is requesting a check on his PSA in office today.  If more elevated than last check he will return to urology.  Denies f/c/s, n/v/d, hemoptysis, PND, leg swelling. Denies chest pain or edema.      Allergies  Allergen Reactions   Other Rash    Allergen: "Plants and bushes while doing yard workAdministrator, Civil Service Extract Rash    Immunization History  Administered Date(s) Administered   Fluad Quad(high Dose 65+) 01/22/2019   Influenza Split 02/17/2015   Influenza, High Dose Seasonal PF 02/15/2018, 01/22/2020, 01/23/2022   Influenza, Quadrivalent, Recombinant, Inj, Pf 01/22/2019   Influenza,inj,Quad PF,6+ Mos 02/11/2016, 02/08/2017   Influenza-Unspecified 02/17/2015, 01/22/2019, 01/23/2022   Moderna Sars-Covid-2 Vaccination 08/20/2019, 09/17/2019, 03/25/2020   Pneumococcal Conjugate-13 02/24/2014   Pneumococcal Polysaccharide-23 09/08/2015   Tdap 09/08/2015   Unspecified SARS-COV-2 Vaccination 08/20/2019   Zoster Recombinant(Shingrix) 02/23/2022, 04/26/2022   Zoster, Live 05/22/2013    Past Medical History:  Diagnosis Date   Adenomatous  colon polyp    Alcoholism (HCC)    Anxiety    Atrial fibrillation (HCC)    CAD (coronary artery disease)    Cataract    removed left eye    CHF (congestive heart failure) (HCC) 12/16/2020   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 Clifton James & Dr. 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    Tobacco History: Social History   Tobacco Use  Smoking Status Former   Packs/day: 0.75   Years: 59.00   Additional pack years: 0.00   Total pack years: 44.25   Types: Cigarettes   Quit date: 12/16/2020   Years since quitting: 1.9  Smokeless  Tobacco Never   Counseling given: Not Answered   Outpatient Encounter Medications as of 11/27/2022  Medication Sig   amoxicillin (AMOXIL) 500 MG tablet Take 4 tablets (2000 mg) by mouth ONE HOUR before any dental procedures.   ascorbic acid (VITAMIN C) 1000 MG tablet Take 1,000 mg by mouth daily.   B Complex Vitamins (VITAMIN-B COMPLEX PO) Take 1 tablet by mouth daily with supper.   BIOTIN PO Take 1 tablet by mouth every evening.   buPROPion (WELLBUTRIN SR) 200 MG 12 hr tablet Take 1 tablet (200 mg total) by mouth every morning.   buPROPion ER  (WELLBUTRIN SR) 100 MG 12 hr tablet 1 qhs   Calcium Carb-Cholecalciferol 500-10 MG-MCG TABS Take 1 tablet by mouth daily in the afternoon.   Calcium Carbonate-Vit D-Min (CALCIUM 1200 PO) Take 1,200 mg by mouth every evening.   calcium elemental as carbonate (TUMS ULTRA 1000) 400 MG chewable tablet Chew 1,000 mg by mouth daily as needed for heartburn.   Cholecalciferol 1.25 MG (50000 UT) capsule Take 50,000 Units by mouth. Every other Saturday   gabapentin (NEURONTIN) 100 MG capsule 3  qhs   ketoconazole (NIZORAL) 2 % shampoo Apply 1 application topically 2 (two) times a week.   latanoprost (XALATAN) 0.005 % ophthalmic solution Place 1 drop into the left eye every other day.   LYCOPENE PO Take 10 mg by mouth every other day.   Magnesium 250 MG TABS Take 250 mg by mouth every 3 (three) days.   niacin (VITAMIN B3) 500 MG tablet Take 500 mg by mouth 3 (three) times a week.   Omega-3 Fatty Acids (FISH OIL) 1000 MG CAPS Take 1,000 mg by mouth daily with supper.   prednisoLONE acetate (PRED FORTE) 1 % ophthalmic suspension Place 1 drop into both eyes. Every other day   Sodium Fluoride (PREVIDENT 5000 DRY MOUTH DT) Place 1 application onto teeth See admin instructions. Apply to gums every night for dry mouth   warfarin (COUMADIN) 4 MG tablet TAKE 1/2 TO 1 TABLET DAILY AS DIRECTED BY COUMADIN CLINIC   [DISCONTINUED] albuterol (VENTOLIN HFA) 108 (90 Base) MCG/ACT inhaler Inhale 2 puffs into the lungs every 6 (six) hours as needed for wheezing or shortness of breath.   [DISCONTINUED] docusate sodium (COLACE) 100 MG capsule Take 100 mg by mouth every evening.   [DISCONTINUED] ezetimibe (ZETIA) 10 MG tablet Take 1 tablet (10 mg total) by mouth daily.   [DISCONTINUED] metFORMIN (GLUCOPHAGE) 500 MG tablet Take 1 tablet (500 mg total) by mouth 2 (two) times daily with a meal.   [DISCONTINUED] metoprolol succinate (TOPROL-XL) 50 MG 24 hr tablet Take 1 tablet (50 mg total) by mouth 2 (two) times daily. Take with or  immediately following a meal.   [DISCONTINUED] rosuvastatin (CRESTOR) 20 MG tablet Take 1 tablet (20 mg total) by mouth daily.   [DISCONTINUED] vitamin B-12 (CYANOCOBALAMIN) 500 MCG tablet Take 500 mcg by mouth every other day.   [DISCONTINUED] vitamin C (ASCORBIC ACID) 500 MG tablet Take 500-1,000 mg by mouth See admin instructions. Alternate taking 500 mg one day and 1000 mg the next   [DISCONTINUED] Vitamin D, Ergocalciferol, (DRISDOL) 1.25 MG (50000 UNIT) CAPS capsule TAKE 1 CAPSULE (50,000 UNITS TOTAL) BY MOUTH EVERY SATURDAY.   albuterol (VENTOLIN HFA) 108 (90 Base) MCG/ACT inhaler Inhale 2 puffs into the lungs every 6 (six) hours as needed for wheezing or shortness of breath.   ascorbic acid (VITAMIN C) 500 MG tablet Take 1-2 tablets (500-1,000 mg total) by  mouth See admin instructions. Alternate taking 500 mg one day and 1000 mg the next   docusate sodium (COLACE) 100 MG capsule Take 1 capsule (100 mg total) by mouth every evening.   ezetimibe (ZETIA) 10 MG tablet Take 1 tablet (10 mg total) by mouth daily.   metFORMIN (GLUCOPHAGE) 500 MG tablet Take 1 tablet (500 mg total) by mouth 2 (two) times daily with a meal.   metoprolol succinate (TOPROL-XL) 50 MG 24 hr tablet Take 1 tablet (50 mg total) by mouth 2 (two) times daily. Take with or immediately following a meal.   rosuvastatin (CRESTOR) 20 MG tablet Take 1 tablet (20 mg total) by mouth daily.   vitamin B-12 (CYANOCOBALAMIN) 500 MCG tablet Take 1 tablet (500 mcg total) by mouth every other day.   [START ON 12/02/2022] Vitamin D, Ergocalciferol, (DRISDOL) 1.25 MG (50000 UNIT) CAPS capsule Take 1 capsule (50,000 Units total) by mouth every Saturday.   No facility-administered encounter medications on file as of 11/27/2022.     Review of Systems  Review of Systems  Constitutional: Negative.   HENT: Negative.    Cardiovascular: Negative.   Gastrointestinal: Negative.   Allergic/Immunologic: Negative.   Neurological: Negative.    Psychiatric/Behavioral: Negative.         Physical Exam  BP 124/84   Pulse 87   Temp (!) 97.1 F (36.2 C)   Ht 5\' 8"  (1.727 m)   Wt 213 lb 9.6 oz (96.9 kg)   SpO2 98%   BMI 32.48 kg/m   Wt Readings from Last 5 Encounters:  11/27/22 213 lb 9.6 oz (96.9 kg)  11/08/22 214 lb 6.4 oz (97.3 kg)  08/28/22 217 lb (98.4 kg)  07/27/22 216 lb 3.2 oz (98.1 kg)  05/26/22 218 lb (98.9 kg)     Physical Exam Vitals and nursing note reviewed.  Constitutional:      General: He is not in acute distress.    Appearance: He is well-developed.  Cardiovascular:     Rate and Rhythm: Normal rate and regular rhythm.  Pulmonary:     Effort: Pulmonary effort is normal.     Breath sounds: Normal breath sounds.  Skin:    General: Skin is warm and dry.  Neurological:     Mental Status: He is alert and oriented to person, place, and time.      Lab Results:  CBC    Component Value Date/Time   WBC 12.2 (H) 07/27/2022 0719   RBC 4.99 07/27/2022 0719   HGB 15.7 07/27/2022 0719   HGB 16.0 05/26/2022 0908   HGB 15.8 01/01/2017 0752   HCT 44.4 07/27/2022 0719   HCT 48.7 05/26/2022 0908   HCT 46.4 01/01/2017 0752   PLT 166 07/27/2022 0719   PLT 196 05/26/2022 0908   MCV 89.0 07/27/2022 0719   MCV 93 05/26/2022 0908   MCV 91.8 01/01/2017 0752   MCH 31.5 07/27/2022 0719   MCHC 35.4 07/27/2022 0719   RDW 13.2 07/27/2022 0719   RDW 13.7 05/26/2022 0908   RDW 13.5 01/01/2017 0752   LYMPHSABS 5.3 (H) 07/27/2022 0719   LYMPHSABS 9.5 (H) 04/30/2019 1103   LYMPHSABS 9.7 (H) 01/01/2017 0752   MONOABS 1.0 07/27/2022 0719   MONOABS 1.5 (H) 01/01/2017 0752   EOSABS 0.3 07/27/2022 0719   EOSABS 0.2 04/30/2019 1103   BASOSABS 0.1 07/27/2022 0719   BASOSABS 0.1 04/30/2019 1103   BASOSABS 0.1 01/01/2017 0752    BMET    Component Value Date/Time  NA 144 05/26/2022 0908   NA 141 01/04/2015 0808   K 4.8 05/26/2022 0908   K 4.3 01/04/2015 0808   CL 105 05/26/2022 0908   CO2 23 05/26/2022  0908   CO2 21 (L) 01/04/2015 0808   GLUCOSE 135 (H) 05/26/2022 0908   GLUCOSE 102 (H) 02/09/2021 0116   GLUCOSE 115 01/04/2015 0808   BUN 16 05/26/2022 0908   BUN 18.7 01/04/2015 0808   CREATININE 1.11 05/26/2022 0908   CREATININE 1.16 03/10/2016 0830   CREATININE 0.9 01/04/2015 0808   CALCIUM 10.0 05/26/2022 0908   CALCIUM 9.0 01/04/2015 0808   GFRNONAA 56 (L) 02/09/2021 0116   GFRNONAA 70 02/11/2016 1526   GFRAA 79 04/26/2020 0941   GFRAA 81 02/11/2016 1526    BNP    Component Value Date/Time   BNP 672.5 (H) 01/24/2021 0409    ProBNP No results found for: "PROBNP"  Imaging: CT CHEST LUNG CA SCREEN LOW DOSE W/O CM  Result Date: 11/08/2022 CLINICAL DATA:  77 year old male with 37 pack-year history of smoking. Lung cancer screening. EXAM: CT CHEST WITHOUT CONTRAST LOW-DOSE FOR LUNG CANCER SCREENING TECHNIQUE: Multidetector CT imaging of the chest was performed following the standard protocol without IV contrast. RADIATION DOSE REDUCTION: This exam was performed according to the departmental dose-optimization program which includes automated exposure control, adjustment of the mA and/or kV according to patient size and/or use of iterative reconstruction technique. COMPARISON:  09/06/2021 FINDINGS: Cardiovascular: The heart size is normal. No substantial pericardial effusion. Coronary artery calcification is evident. Status post CABG. Status post TAVR. Mediastinum/Nodes: No mediastinal lymphadenopathy. No evidence for gross hilar lymphadenopathy although assessment is limited by the lack of intravenous contrast on the current study. The esophagus has normal imaging features. There is no axillary lymphadenopathy. Lungs/Pleura: Scattered tiny bilateral pulmonary nodules identified previously are stable in the interval. Centrilobular and paraseptal emphysema evident. No new suspicious pulmonary nodule or mass. No focal airspace consolidation. No pleural effusion. Upper Abdomen: Stable  low-density lesion upper pole right kidney is compatible with a cyst. No followup imaging is recommended. Otherwise, visualized portion of the upper abdomen is unremarkable. Musculoskeletal: No worrisome lytic or sclerotic osseous abnormality. IMPRESSION: Lung-RADS 2, benign appearance or behavior. Continue annual screening with low-dose chest CT without contrast in 12 months. Aortic Atherosclerosis (ICD10-I70.0) and Emphysema (ICD10-J43.9). Electronically Signed   By: Kennith Center M.D.   On: 11/08/2022 11:01    Assessment & Plan:   Diabetes mellitus (HCC) - POCT glycosylated hemoglobin (Hb A1C) - Ambulatory referral to Podiatry - CBC - Comprehensive metabolic panel   2. Vitamin D deficiency  - Vitamin D, 25-hydroxy   3. Prostate cancer (HCC)  - PSA   Follow up:  Follow up in 3 months     Ivonne [name], NP 11/27/2022

## 2022-11-27 NOTE — Assessment & Plan Note (Signed)
-   POCT glycosylated hemoglobin (Hb A1C) - Ambulatory referral to Podiatry - CBC - Comprehensive metabolic panel   2. Vitamin D deficiency  - Vitamin D, 25-hydroxy   3. Prostate cancer (HCC)  - PSA   Follow up:  Follow up in 3 months

## 2022-11-28 ENCOUNTER — Ambulatory Visit (INDEPENDENT_AMBULATORY_CARE_PROVIDER_SITE_OTHER): Payer: Medicare Other | Admitting: Licensed Clinical Social Worker

## 2022-11-28 DIAGNOSIS — F339 Major depressive disorder, recurrent, unspecified: Secondary | ICD-10-CM

## 2022-11-28 LAB — CBC
Hematocrit: 47.4 % (ref 37.5–51.0)
Hemoglobin: 15.5 g/dL (ref 13.0–17.7)
MCH: 30.6 pg (ref 26.6–33.0)
MCHC: 32.7 g/dL (ref 31.5–35.7)
MCV: 94 fL (ref 79–97)
Platelets: 177 10*3/uL (ref 150–450)
RBC: 5.07 x10E6/uL (ref 4.14–5.80)
RDW: 13.3 % (ref 11.6–15.4)
WBC: 13.7 10*3/uL — ABNORMAL HIGH (ref 3.4–10.8)

## 2022-11-28 LAB — COMPREHENSIVE METABOLIC PANEL
ALT: 33 IU/L (ref 0–44)
AST: 32 IU/L (ref 0–40)
Albumin: 4.3 g/dL (ref 3.8–4.8)
Alkaline Phosphatase: 42 IU/L — ABNORMAL LOW (ref 44–121)
BUN/Creatinine Ratio: 11 (ref 10–24)
BUN: 15 mg/dL (ref 8–27)
Bilirubin Total: 0.3 mg/dL (ref 0.0–1.2)
CO2: 23 mmol/L (ref 20–29)
Calcium: 9.9 mg/dL (ref 8.6–10.2)
Chloride: 106 mmol/L (ref 96–106)
Creatinine, Ser: 1.32 mg/dL — ABNORMAL HIGH (ref 0.76–1.27)
Globulin, Total: 2.1 g/dL (ref 1.5–4.5)
Glucose: 118 mg/dL — ABNORMAL HIGH (ref 70–99)
Potassium: 4.9 mmol/L (ref 3.5–5.2)
Sodium: 143 mmol/L (ref 134–144)
Total Protein: 6.4 g/dL (ref 6.0–8.5)
eGFR: 56 mL/min/{1.73_m2} — ABNORMAL LOW (ref >59)

## 2022-11-28 LAB — VITAMIN D 25 HYDROXY (VIT D DEFICIENCY, FRACTURES): Vit D, 25-Hydroxy: 113 ng/mL — ABNORMAL HIGH (ref 30.0–100.0)

## 2022-11-28 LAB — PSA: Prostate Specific Ag, Serum: 0.7 ng/mL (ref 0.0–4.0)

## 2022-11-29 ENCOUNTER — Encounter (HOSPITAL_COMMUNITY): Payer: Self-pay | Admitting: Licensed Clinical Social Worker

## 2022-11-29 NOTE — Progress Notes (Signed)
   THERAPIST PROGRESS NOTE  Session Time: 8:00am-9:00am  Participation Level: Active  Behavioral Response: Well GroomedAlertHopeless  Type of Therapy: Individual Therapy  Treatment Goals addressed: "to cope with stress and manage depression and anxiety". Rodney Friedman will report improved stress management and coping skills, resulting in mood stability and less worrying 4 out of 7 days.       ProgressTowards Goals: Progressing  Interventions: CBT  Summary: Rodney Friedman is a 77 y.o. male who presents with MDD, recurrent, chronic.   Suicidal/Homicidal: Nowithout intent/plan  Therapist Response: Rodney Friedman engaged well in individual session with Facilities manager. Clinician utilized CBT to process thoughts, feelings and interactions. Clinician explored interactions with friends and family. Rodney Friedman shared updates, positives with his daughter and grandchildren, some ongoing frustration with son, and some sadness that his social circle is reducing due to aging. Clinician reflected the disappointment and some level of anxiety/fear about losing social supports who can help with transportation to appointments and meetings. Rodney Friedman shared he continues to attend his AA/NA meetings for a sense of community and socialization, which has been extremely helpful. Clinician reflected the importance of looking for good in order to find good in the world, reducing tv and news watching, and getting out more often. Clinician also explored options for getting a flight to [place] to visit his daughter, since she was unable to get here for her annual Father's day/Birthday trip.   Plan: Return again in 2 weeks.  Diagnosis: Major depression, recurrent, chronic (HCC)  Collaboration of Care: Psychiatrist AEB provided update to Dr. Alisia Ferrari  Patient/Guardian was advised Release of Information must be obtained prior to any record release in order to collaborate their care with an outside provider. Patient/Guardian was advised if they have not  already done so to contact the registration department to sign all necessary forms in order for [place] to release information regarding their care.   Consent: Patient/Guardian gives verbal consent for treatment and assignment of benefits for services provided during this visit. Patient/Guardian expressed understanding and agreed to proceed.   Rodney Friedman [place], LCSW 11/29/2022

## 2022-12-01 ENCOUNTER — Ambulatory Visit: Payer: Medicare Other | Attending: Cardiovascular Disease

## 2022-12-01 DIAGNOSIS — Z7901 Long term (current) use of anticoagulants: Secondary | ICD-10-CM

## 2022-12-01 DIAGNOSIS — I482 Chronic atrial fibrillation, unspecified: Secondary | ICD-10-CM

## 2022-12-01 LAB — POCT INR: INR: 2 (ref 2.0–3.0)

## 2022-12-01 NOTE — Patient Instructions (Signed)
Description   Take 1.5 tablets today and then continue taking warfarin 1/2 tablet daily except for 1 tablet on Monday, Wednesday, and Friday.  Stay consistent with greens each week (start eating them 2-3 per week)   Recheck INR in 4 weeks.  Coumadin Clinic [phone]

## 2022-12-12 ENCOUNTER — Ambulatory Visit (INDEPENDENT_AMBULATORY_CARE_PROVIDER_SITE_OTHER): Payer: Medicare Other | Admitting: Licensed Clinical Social Worker

## 2022-12-12 DIAGNOSIS — F339 Major depressive disorder, recurrent, unspecified: Secondary | ICD-10-CM

## 2022-12-13 ENCOUNTER — Encounter (HOSPITAL_COMMUNITY): Payer: Self-pay | Admitting: Licensed Clinical Social Worker

## 2022-12-13 NOTE — Progress Notes (Signed)
   THERAPIST PROGRESS NOTE  Session Time: 8:00am-9:00am  Participation Level: Active  Behavioral Response: Well GroomedAlertIrritable  Type of Therapy: Individual Therapy  Treatment Goals addressed:  "to cope with stress and manage depression and anxiety". Mykale will report improved stress management and coping skills, resulting in mood stability and less worrying 4 out of 7 days.       ProgressTowards Goals: Progressing  Interventions: CBT  Summary: Rodney Friedman is a 77 y.o. male who presents with MDD, recurrent, chronic.   Suicidal/Homicidal: Nowithout intent/plan  Therapist Response: Rodney Friedman engaged well in individual session with Facilities manager. Clinician utilized CBT to process thoughts, feelings, and interactions. Rodney Friedman shared updates and frustrations with "life on life's terms". Clinician processed concerns and continuing worry about the future. Clinician identified alternatives to worrying, as well as concerns that Rodney Friedman continues to look for and wait for something catastrophic to happen. Clinician discussed options for looking for strengths and positivity in his daily life in order to enjoy himself more. Rodney Friedman shared that he continues daily wood carving and he also attends NA and AA meetings, although this is getting increasingly frustrating. Clinician discussed Rodney Friedman's fear of aging, not being able to depend on his son, and worries about his health. Clinician validated these typical concerns for people living on their own, aging, and having few supports. Clinician offered Senior Resources referral. Rodney Friedman declined at this time.   Plan: Return again in 2 weeks.  Diagnosis: Major depression, recurrent, chronic (HCC)  Collaboration of Care: Psychiatrist AEB provided update to Dr. Donell Beers  Patient/Guardian was advised Release of Information must be obtained prior to any record release in order to collaborate their care with an outside provider. Patient/Guardian was advised if they have not  already done so to contact the registration department to sign all necessary forms in order for [place] to release information regarding their care.   Consent: Patient/Guardian gives verbal consent for treatment and assignment of benefits for services provided during this visit. Patient/Guardian expressed understanding and agreed to proceed.   Chryl Heck [place], LCSW 12/13/2022

## 2022-12-23 ENCOUNTER — Encounter: Payer: Self-pay | Admitting: Hematology and Oncology

## 2022-12-23 ENCOUNTER — Encounter: Payer: Self-pay | Admitting: Cardiovascular Disease

## 2022-12-26 ENCOUNTER — Ambulatory Visit (INDEPENDENT_AMBULATORY_CARE_PROVIDER_SITE_OTHER): Payer: Medicare Other | Admitting: Licensed Clinical Social Worker

## 2022-12-26 ENCOUNTER — Encounter (HOSPITAL_COMMUNITY): Payer: Self-pay | Admitting: Licensed Clinical Social Worker

## 2022-12-26 DIAGNOSIS — F339 Major depressive disorder, recurrent, unspecified: Secondary | ICD-10-CM | POA: Diagnosis not present

## 2022-12-26 NOTE — Progress Notes (Signed)
   THERAPIST PROGRESS NOTE  Session Time: 11:00am-11:55am  Participation Level: Active  Behavioral Response: NeatAlertDepressed and Irritable  Type of Therapy: Individual Therapy  Treatment Goals addressed: "to cope with stress and manage depression and anxiety". Wyitt will report improved stress management and coping skills, resulting in mood stability and less worrying 4 out of 7 days.       ProgressTowards Goals: Progressing  Interventions: CBT  Summary: Kaiser Gundlach is a 77 y.o. male who presents with MDD, recurrent, chronic.   Suicidal/Homicidal: Nowithout intent/plan  Therapist Response: Khylan engaged well in session with clinician. Clinician utilized CBT to process updates in life. Clinician reflected ongoing frustration and fear about the future. Clinician provided time and space for Crishawn to share his current obsessions, particularly the concern he has for his son's financial future, his own health and aging, and frustration with chores of daily life. Clinician discussed ways to adjust his attitude and expectations for himself, his family, and the rest of his life. Clinician validated the fear and also noted that importance of carrying on and being able to do what needs to be done. Clinician discussed the importance of being flexible in his expectations and his willingness to go with the flow. Clinician discussed use of gratitude as a way to ground himself when stress and anxiety rev up.   Plan: Return again in 2 weeks.  Diagnosis: Major depression, recurrent, chronic (HCC)  Collaboration of Care: Patient refused AEB none required  Patient/Guardian was advised Release of Information must be obtained prior to any record release in order to collaborate their care with an outside provider. Patient/Guardian was advised if they have not already done so to contact the registration department to sign all necessary forms in order for [place] to release information regarding their care.    Consent: Patient/Guardian gives verbal consent for treatment and assignment of benefits for services provided during this visit. Patient/Guardian expressed understanding and agreed to proceed.   Chryl Heck [place], LCSW 12/26/2022

## 2022-12-29 ENCOUNTER — Ambulatory Visit: Payer: Medicare Other | Attending: Cardiovascular Disease

## 2022-12-29 DIAGNOSIS — Z7901 Long term (current) use of anticoagulants: Secondary | ICD-10-CM | POA: Insufficient documentation

## 2022-12-29 DIAGNOSIS — I482 Chronic atrial fibrillation, unspecified: Secondary | ICD-10-CM | POA: Diagnosis not present

## 2022-12-29 LAB — POCT INR: INR: 1.7 — AB (ref 2.0–3.0)

## 2022-12-29 NOTE — Patient Instructions (Signed)
Description   Take 1.5 tablets today and then continue taking warfarin 1/2 tablet daily except for 1 tablet on Monday, Wednesday, and Friday.  Stay consistent with greens each week (start eating them 2-3 per week)   Recheck INR in 3 weeks.  Coumadin Clinic [phone]

## 2023-01-02 ENCOUNTER — Emergency Department (HOSPITAL_COMMUNITY): Payer: Medicare Other

## 2023-01-02 ENCOUNTER — Other Ambulatory Visit: Payer: Self-pay

## 2023-01-02 ENCOUNTER — Encounter (HOSPITAL_COMMUNITY): Payer: Self-pay | Admitting: Internal Medicine

## 2023-01-02 ENCOUNTER — Observation Stay (HOSPITAL_BASED_OUTPATIENT_CLINIC_OR_DEPARTMENT_OTHER): Payer: Medicare Other

## 2023-01-02 ENCOUNTER — Inpatient Hospital Stay (HOSPITAL_COMMUNITY)
Admission: EM | Admit: 2023-01-02 | Discharge: 2023-01-05 | DRG: 065 | Disposition: A | Discharge status: Rehabilitation Facility | Payer: Medicare Other | Attending: Student | Admitting: Student

## 2023-01-02 DIAGNOSIS — R270 Ataxia, unspecified: Secondary | ICD-10-CM | POA: Diagnosis not present

## 2023-01-02 DIAGNOSIS — Z7901 Long term (current) use of anticoagulants: Secondary | ICD-10-CM

## 2023-01-02 DIAGNOSIS — H409 Unspecified glaucoma: Secondary | ICD-10-CM | POA: Diagnosis present

## 2023-01-02 DIAGNOSIS — I6389 Other cerebral infarction: Secondary | ICD-10-CM | POA: Diagnosis not present

## 2023-01-02 DIAGNOSIS — E669 Obesity, unspecified: Secondary | ICD-10-CM | POA: Diagnosis present

## 2023-01-02 DIAGNOSIS — Z87891 Personal history of nicotine dependence: Secondary | ICD-10-CM

## 2023-01-02 DIAGNOSIS — I252 Old myocardial infarction: Secondary | ICD-10-CM

## 2023-01-02 DIAGNOSIS — Z951 Presence of aortocoronary bypass graft: Secondary | ICD-10-CM

## 2023-01-02 DIAGNOSIS — I482 Chronic atrial fibrillation, unspecified: Secondary | ICD-10-CM | POA: Diagnosis present

## 2023-01-02 DIAGNOSIS — E785 Hyperlipidemia, unspecified: Secondary | ICD-10-CM | POA: Diagnosis present

## 2023-01-02 DIAGNOSIS — Z952 Presence of prosthetic heart valve: Secondary | ICD-10-CM

## 2023-01-02 DIAGNOSIS — E119 Type 2 diabetes mellitus without complications: Secondary | ICD-10-CM | POA: Diagnosis present

## 2023-01-02 DIAGNOSIS — K5900 Constipation, unspecified: Secondary | ICD-10-CM | POA: Diagnosis present

## 2023-01-02 DIAGNOSIS — F32A Depression, unspecified: Secondary | ICD-10-CM | POA: Diagnosis present

## 2023-01-02 DIAGNOSIS — R531 Weakness: Secondary | ICD-10-CM | POA: Diagnosis not present

## 2023-01-02 DIAGNOSIS — I6381 Other cerebral infarction due to occlusion or stenosis of small artery: Principal | ICD-10-CM | POA: Diagnosis present

## 2023-01-02 DIAGNOSIS — I6523 Occlusion and stenosis of bilateral carotid arteries: Secondary | ICD-10-CM | POA: Diagnosis present

## 2023-01-02 DIAGNOSIS — Z8049 Family history of malignant neoplasm of other genital organs: Secondary | ICD-10-CM

## 2023-01-02 DIAGNOSIS — Z800 Family history of malignant neoplasm of digestive organs: Secondary | ICD-10-CM

## 2023-01-02 DIAGNOSIS — R29705 NIHSS score 5: Secondary | ICD-10-CM | POA: Diagnosis present

## 2023-01-02 DIAGNOSIS — R29810 Facial weakness: Secondary | ICD-10-CM | POA: Diagnosis present

## 2023-01-02 DIAGNOSIS — C9110 Chronic lymphocytic leukemia of B-cell type not having achieved remission: Secondary | ICD-10-CM | POA: Diagnosis present

## 2023-01-02 DIAGNOSIS — I639 Cerebral infarction, unspecified: Secondary | ICD-10-CM | POA: Diagnosis present

## 2023-01-02 DIAGNOSIS — M810 Age-related osteoporosis without current pathological fracture: Secondary | ICD-10-CM | POA: Diagnosis present

## 2023-01-02 DIAGNOSIS — E6609 Other obesity due to excess calories: Secondary | ICD-10-CM

## 2023-01-02 DIAGNOSIS — Z8042 Family history of malignant neoplasm of prostate: Secondary | ICD-10-CM

## 2023-01-02 DIAGNOSIS — Z6831 Body mass index (BMI) 31.0-31.9, adult: Secondary | ICD-10-CM

## 2023-01-02 DIAGNOSIS — I10 Essential (primary) hypertension: Secondary | ICD-10-CM

## 2023-01-02 DIAGNOSIS — Z79899 Other long term (current) drug therapy: Secondary | ICD-10-CM

## 2023-01-02 DIAGNOSIS — J449 Chronic obstructive pulmonary disease, unspecified: Secondary | ICD-10-CM | POA: Diagnosis present

## 2023-01-02 DIAGNOSIS — E782 Mixed hyperlipidemia: Secondary | ICD-10-CM | POA: Diagnosis not present

## 2023-01-02 DIAGNOSIS — I2510 Atherosclerotic heart disease of native coronary artery without angina pectoris: Secondary | ICD-10-CM | POA: Diagnosis present

## 2023-01-02 DIAGNOSIS — I5032 Chronic diastolic (congestive) heart failure: Secondary | ICD-10-CM | POA: Diagnosis present

## 2023-01-02 DIAGNOSIS — Z7984 Long term (current) use of oral hypoglycemic drugs: Secondary | ICD-10-CM

## 2023-01-02 DIAGNOSIS — G8191 Hemiplegia, unspecified affecting right dominant side: Secondary | ICD-10-CM | POA: Diagnosis present

## 2023-01-02 DIAGNOSIS — J432 Centrilobular emphysema: Secondary | ICD-10-CM

## 2023-01-02 DIAGNOSIS — G4733 Obstructive sleep apnea (adult) (pediatric): Secondary | ICD-10-CM | POA: Diagnosis present

## 2023-01-02 DIAGNOSIS — Z8546 Personal history of malignant neoplasm of prostate: Secondary | ICD-10-CM

## 2023-01-02 DIAGNOSIS — Z8041 Family history of malignant neoplasm of ovary: Secondary | ICD-10-CM

## 2023-01-02 DIAGNOSIS — E559 Vitamin D deficiency, unspecified: Secondary | ICD-10-CM | POA: Diagnosis present

## 2023-01-02 DIAGNOSIS — Z8249 Family history of ischemic heart disease and other diseases of the circulatory system: Secondary | ICD-10-CM

## 2023-01-02 DIAGNOSIS — I110 Hypertensive heart disease with heart failure: Secondary | ICD-10-CM | POA: Diagnosis present

## 2023-01-02 DIAGNOSIS — Z961 Presence of intraocular lens: Secondary | ICD-10-CM | POA: Diagnosis present

## 2023-01-02 LAB — ECHOCARDIOGRAM COMPLETE BUBBLE STUDY
AV Mean grad: 13 mmHg
AV Peak grad: 24.8 mmHg
Ao pk vel: 2.49 m/s
Area-P 1/2: 2.66 cm2
Height: 68 in
S' Lateral: 2.9 cm
Weight: 3365.1 oz

## 2023-01-02 LAB — CBG MONITORING, ED
Glucose-Capillary: 126 mg/dL — ABNORMAL HIGH (ref 70–99)
Glucose-Capillary: 115 mg/dL — ABNORMAL HIGH (ref 70–99)

## 2023-01-02 LAB — HEPARIN LEVEL (UNFRACTIONATED): Heparin Unfractionated: <0.1 IU/mL — ABNORMAL LOW (ref 0.30–0.70)

## 2023-01-02 LAB — I-STAT CHEM 8, ED
BUN: 15 mg/dL (ref 8–23)
Calcium, Ion: 1.12 mmol/L — ABNORMAL LOW (ref 1.15–1.40)
Chloride: 108 mmol/L (ref 98–111)
Creatinine, Ser: 1.3 mg/dL — ABNORMAL HIGH (ref 0.61–1.24)
Glucose, Bld: 122 mg/dL — ABNORMAL HIGH (ref 70–99)
HCT: 44 % (ref 39.0–52.0)
Hemoglobin: 15 g/dL (ref 13.0–17.0)
Potassium: 4 mmol/L (ref 3.5–5.1)
Sodium: 142 mmol/L (ref 135–145)
TCO2: 20 mmol/L — ABNORMAL LOW (ref 22–32)

## 2023-01-02 LAB — COMPREHENSIVE METABOLIC PANEL
ALT: 25 U/L (ref 0–44)
AST: 31 U/L (ref 15–41)
Albumin: 3.8 g/dL (ref 3.5–5.0)
Alkaline Phosphatase: 35 U/L — ABNORMAL LOW (ref 38–126)
Anion gap: 13 (ref 5–15)
BUN: 13 mg/dL (ref 8–23)
CO2: 19 mmol/L — ABNORMAL LOW (ref 22–32)
Calcium: 9.4 mg/dL (ref 8.9–10.3)
Chloride: 107 mmol/L (ref 98–111)
Creatinine, Ser: 1.37 mg/dL — ABNORMAL HIGH (ref 0.61–1.24)
GFR, Estimated: 54 mL/min — ABNORMAL LOW (ref >60)
Glucose, Bld: 127 mg/dL — ABNORMAL HIGH (ref 70–99)
Potassium: 3.9 mmol/L (ref 3.5–5.1)
Sodium: 139 mmol/L (ref 135–145)
Total Bilirubin: 0.9 mg/dL (ref 0.3–1.2)
Total Protein: 6.6 g/dL (ref 6.5–8.1)

## 2023-01-02 LAB — DIFFERENTIAL
Abs Immature Granulocytes: 0 10*3/uL (ref 0.00–0.07)
Basophils Absolute: 0.3 10*3/uL — ABNORMAL HIGH (ref 0.0–0.1)
Basophils Relative: 2 %
Eosinophils Absolute: 0.7 10*3/uL — ABNORMAL HIGH (ref 0.0–0.5)
Eosinophils Relative: 5 %
Lymphocytes Relative: 33 %
Lymphs Abs: 4.7 10*3/uL — ABNORMAL HIGH (ref 0.7–4.0)
Monocytes Absolute: 1.4 10*3/uL — ABNORMAL HIGH (ref 0.1–1.0)
Monocytes Relative: 10 %
Neutro Abs: 7.1 10*3/uL (ref 1.7–7.7)
Neutrophils Relative %: 50 %
nRBC: 0 /100 WBC

## 2023-01-02 LAB — URINALYSIS, W/ REFLEX TO CULTURE (INFECTION SUSPECTED)
Bacteria, UA: NONE SEEN
Bilirubin Urine: NEGATIVE
Glucose, UA: NEGATIVE mg/dL
Hgb urine dipstick: NEGATIVE
Ketones, ur: NEGATIVE mg/dL
Leukocytes,Ua: NEGATIVE
Nitrite: NEGATIVE
Protein, ur: NEGATIVE mg/dL
Specific Gravity, Urine: 1.043 — ABNORMAL HIGH (ref 1.005–1.030)
pH: 5 (ref 5.0–8.0)

## 2023-01-02 LAB — GLUCOSE, CAPILLARY
Glucose-Capillary: 111 mg/dL — ABNORMAL HIGH (ref 70–99)
Glucose-Capillary: 132 mg/dL — ABNORMAL HIGH (ref 70–99)

## 2023-01-02 LAB — PROTIME-INR
INR: 2.4 — ABNORMAL HIGH (ref 0.8–1.2)
Prothrombin Time: 26.1 seconds — ABNORMAL HIGH (ref 11.4–15.2)

## 2023-01-02 LAB — CBC
HCT: 44.1 % (ref 39.0–52.0)
Hemoglobin: 14.9 g/dL (ref 13.0–17.0)
MCH: 30.2 pg (ref 26.0–34.0)
MCHC: 33.8 g/dL (ref 30.0–36.0)
MCV: 89.3 fL (ref 80.0–100.0)
Platelets: 175 10*3/uL (ref 150–400)
RBC: 4.94 MIL/uL (ref 4.22–5.81)
RDW: 13.3 % (ref 11.5–15.5)
WBC: 14.2 10*3/uL — ABNORMAL HIGH (ref 4.0–10.5)
nRBC: 0 % (ref 0.0–0.2)

## 2023-01-02 LAB — ETHANOL: Alcohol, Ethyl (B): <10 mg/dL (ref <10)

## 2023-01-02 LAB — APTT: aPTT: 35 seconds (ref 24–36)

## 2023-01-02 MED ORDER — PERFLUTREN LIPID MICROSPHERE
1.0000 mL | INTRAVENOUS | Status: AC | PRN
  Administered 2023-01-02: 2 mL via INTRAVENOUS

## 2023-01-02 MED ORDER — HEPARIN (PORCINE) 25000 UT/250ML-% IV SOLN
1150.0000 [IU]/h | INTRAVENOUS | Status: DC
  Administered 2023-01-02: 1150 [IU]/h via INTRAVENOUS
  Filled 2023-01-02: qty 250

## 2023-01-02 MED ORDER — STROKE: EARLY STAGES OF RECOVERY BOOK
Freq: Once | Status: AC
  Filled 2023-01-02: qty 1

## 2023-01-02 MED ORDER — IOHEXOL 350 MG/ML SOLN
75.0000 mL | Freq: Once | INTRAVENOUS | Status: AC | PRN
  Administered 2023-01-02: 75 mL via INTRAVENOUS

## 2023-01-02 MED ORDER — ACETAMINOPHEN 160 MG/5ML PO SOLN: 650.0000 mg | ORAL | Status: DC | PRN

## 2023-01-02 MED ORDER — METOPROLOL TARTRATE 5 MG/5ML IV SOLN: 5.0000 mg | INTRAVENOUS | Status: DC | PRN

## 2023-01-02 MED ORDER — WARFARIN SODIUM 4 MG PO TABS
4.0000 mg | ORAL_TABLET | Freq: Once | ORAL | Status: DC
  Filled 2023-01-02: qty 1

## 2023-01-02 MED ORDER — EZETIMIBE 10 MG PO TABS
10.0000 mg | ORAL_TABLET | Freq: Every day | ORAL | Status: DC
  Administered 2023-01-02 – 2023-01-05 (×4): 10 mg via ORAL
  Filled 2023-01-02 (×4): qty 1

## 2023-01-02 MED ORDER — ALBUTEROL SULFATE (2.5 MG/3ML) 0.083% IN NEBU
2.5000 mg | INHALATION_SOLUTION | Freq: Four times a day (QID) | RESPIRATORY_TRACT | Status: DC | PRN
  Filled 2023-01-02: qty 3

## 2023-01-02 MED ORDER — INSULIN ASPART 100 UNIT/ML IJ SOLN
0.0000 [IU] | Freq: Three times a day (TID) | INTRAMUSCULAR | Status: DC
  Administered 2023-01-03 – 2023-01-05 (×2): 2 [IU] via SUBCUTANEOUS

## 2023-01-02 MED ORDER — ACETAMINOPHEN 325 MG PO TABS
650.0000 mg | ORAL_TABLET | ORAL | Status: DC | PRN
  Administered 2023-01-04: 650 mg via ORAL
  Filled 2023-01-02: qty 2

## 2023-01-02 MED ORDER — SODIUM CHLORIDE 0.9% FLUSH: 3.0000 mL | Freq: Once | INTRAVENOUS | Status: DC

## 2023-01-02 MED ORDER — APIXABAN 5 MG PO TABS
5.0000 mg | ORAL_TABLET | Freq: Two times a day (BID) | ORAL | Status: DC
  Administered 2023-01-02 – 2023-01-05 (×6): 5 mg via ORAL
  Filled 2023-01-02 (×7): qty 1

## 2023-01-02 MED ORDER — ROSUVASTATIN CALCIUM 20 MG PO TABS
20.0000 mg | ORAL_TABLET | Freq: Every day | ORAL | Status: DC
  Administered 2023-01-02 – 2023-01-05 (×4): 20 mg via ORAL
  Filled 2023-01-02 (×4): qty 1

## 2023-01-02 MED ORDER — BUPROPION HCL ER (SR) 100 MG PO TB12
200.0000 mg | ORAL_TABLET | Freq: Every morning | ORAL | Status: DC
  Administered 2023-01-02 – 2023-01-05 (×4): 200 mg via ORAL
  Filled 2023-01-02 (×4): qty 2

## 2023-01-02 MED ORDER — ACETAMINOPHEN 650 MG RE SUPP: 650.0000 mg | RECTAL | Status: DC | PRN

## 2023-01-02 MED ORDER — WARFARIN - PHARMACIST DOSING INPATIENT: Freq: Every day | Status: DC

## 2023-01-02 MED ORDER — BUPROPION HCL ER (SR) 100 MG PO TB12
100.0000 mg | ORAL_TABLET | Freq: Every day | ORAL | Status: DC
  Administered 2023-01-02 – 2023-01-04 (×3): 100 mg via ORAL
  Filled 2023-01-02 (×4): qty 1

## 2023-01-02 MED ORDER — GABAPENTIN 300 MG PO CAPS
300.0000 mg | ORAL_CAPSULE | Freq: Every day | ORAL | Status: DC
  Administered 2023-01-02 – 2023-01-04 (×3): 300 mg via ORAL
  Filled 2023-01-02 (×3): qty 1

## 2023-01-02 MED ORDER — INSULIN ASPART 100 UNIT/ML IJ SOLN: 0.0000 [IU] | Freq: Every day | INTRAMUSCULAR | Status: DC

## 2023-01-02 MED ORDER — WARFARIN SODIUM 2 MG PO TABS
2.0000 mg | ORAL_TABLET | Freq: Once | ORAL | Status: DC
  Filled 2023-01-02: qty 1

## 2023-01-02 NOTE — ED Triage Notes (Signed)
Pt LKW at 8pm by neighbor.  Pt states he got up about 10 pm and had a fall d/t right leg weakness.  Pt states similar symptoms yesterday but that they resolved.  Pt has facial droop on the right, decreased sensation, right sided limb ataxia.  Pt alert and oriented able to answer questions appropriately.  Pt is on coumadin .

## 2023-01-02 NOTE — Progress Notes (Signed)
Pt states he is not a veteran so [facility] pharmacy would not apply. Pt uses [facility] on [address]. He would need to know costs/insurance before it could be prescribed at discharge.

## 2023-01-02 NOTE — ED Provider Notes (Signed)
[place] EMERGENCY DEPARTMENT AT [facility] Provider Note   CSN: [id number] Arrival date & time: 01/02/23  0109     History  Chief Complaint  Patient presents with   Code Stroke    Rodney Friedman is a 77 y.o. male.  77 yo M here with slurred speech, right sided weakness and facial droop after waking up at 2200 and falling. Persistent symptoms here. Unsure last known normal. Similar symptoms yesterday that resolved as well.         Home Medications Prior to Admission medications   Medication Sig Start Date End Date Taking? Authorizing Provider  albuterol (VENTOLIN HFA) 108 (90 Base) MCG/ACT inhaler Inhale 2 puffs into the lungs every 6 (six) hours as needed for wheezing or shortness of breath. 11/27/22   Ivonne [name], NP  amoxicillin (AMOXIL) 500 MG tablet Take 4 tablets (2000 mg) by mouth ONE HOUR before any dental procedures. 01/27/22   Janetta Hora, PA-C  ascorbic acid (VITAMIN C) 1000 MG tablet Take 1,000 mg by mouth daily. 09/09/20   [provider]  ascorbic acid (VITAMIN C) 500 MG tablet Take 1-2 tablets (500-1,000 mg total) by mouth See admin instructions. Alternate taking 500 mg one day and 1000 mg the next 11/27/22   Ivonne [name], NP  B Complex Vitamins (VITAMIN-B COMPLEX PO) Take 1 tablet by mouth daily with supper.    [provider]  BIOTIN PO Take 1 tablet by mouth every evening.    [provider]  buPROPion (WELLBUTRIN SR) 200 MG 12 hr tablet Take 1 tablet (200 mg total) by mouth every morning. 08/08/22   Archer Asa, MD  buPROPion [facility] SR) 100 MG 12 hr tablet 1 qhs 08/08/22   Plovsky, Earvin Hansen, MD  Calcium Carb-Cholecalciferol 500-10 MG-MCG TABS Take 1 tablet by mouth daily in the afternoon. 09/09/20   [provider]  Calcium Carbonate-Vit D-Min (CALCIUM 1200 PO) Take 1,200 mg by mouth every evening.    [provider]  calcium elemental as carbonate (TUMS ULTRA 1000) 400 MG chewable  tablet Chew 1,000 mg by mouth daily as needed for heartburn.    [provider]  Cholecalciferol 1.25 MG (50000 UT) capsule Take 50,000 Units by mouth. Every other Saturday 09/09/20   [provider]  docusate sodium (COLACE) 100 MG capsule Take 1 capsule (100 mg total) by mouth every evening. 11/27/22   Ivonne [name], NP  ezetimibe (ZETIA) 10 MG tablet Take 1 tablet (10 mg total) by mouth daily. 11/27/22   Ivonne [name], NP  gabapentin (NEURONTIN) 100 MG capsule 3  qhs 08/08/22   Plovsky, Earvin Hansen, MD  ketoconazole (NIZORAL) 2 % shampoo Apply 1 application topically 2 (two) times a week. 04/26/20   Shade Flood, MD  latanoprost (XALATAN) 0.005 % ophthalmic solution Place 1 drop into the left eye every other day. 06/30/13   [provider]  LYCOPENE PO Take 10 mg by mouth every other day.    [provider]  Magnesium 250 MG TABS Take 250 mg by mouth every 3 (three) days.    [provider]  metFORMIN (GLUCOPHAGE) 500 MG tablet Take 1 tablet (500 mg total) by mouth 2 (two) times daily with a meal. 11/27/22   Ivonne [name], NP  metoprolol succinate (TOPROL-XL) 50 MG 24 hr tablet Take 1 tablet (50 mg total) by mouth 2 (two) times daily. Take with or immediately following a meal. 11/27/22   Ivonne [name],  NP  niacin (VITAMIN B3) 500 MG tablet Take 500 mg by mouth 3 (three) times a week.    [provider]  Omega-3 Fatty Acids (FISH OIL) 1000 MG CAPS Take 1,000 mg by mouth daily with supper.    [provider]  prednisoLONE acetate (PRED FORTE) 1 % ophthalmic suspension Place 1 drop into both eyes. Every other day 10/01/20   [provider]  rosuvastatin (CRESTOR) 20 MG tablet Take 1 tablet (20 mg total) by mouth daily. 11/27/22   Ivonne [name], NP  Sodium Fluoride (PREVIDENT 5000 DRY MOUTH DT) Place 1 application onto teeth See admin instructions. Apply to gums every night for dry mouth    [provider]  vitamin B-12  (CYANOCOBALAMIN) 500 MCG tablet Take 1 tablet (500 mcg total) by mouth every other day. 11/27/22   Ivonne [name], NP  Vitamin D, Ergocalciferol, (DRISDOL) 1.25 MG (50000 UNIT) CAPS capsule Take 1 capsule (50,000 Units total) by mouth every Saturday. 12/02/22 03/02/23  Ivonne [name], NP  warfarin (COUMADIN) 4 MG tablet TAKE 1/2 TO 1 TABLET DAILY AS DIRECTED BY COUMADIN CLINIC 07/28/22   Croitoru, Mihai, MD      Allergies    Other and Poison ivy extract    Review of Systems   Review of Systems  Physical Exam Updated Vital Signs BP (!) 147/70   Pulse 79   Resp (!) 23   Wt 95.4 kg   SpO2 97%   BMI 31.98 kg/m  Physical Exam Vitals and nursing note reviewed.  Constitutional:      Appearance: He is well-developed.  HENT:     Head: Normocephalic and atraumatic.  Eyes:     Pupils: Pupils are equal, round, and reactive to light.  Cardiovascular:     Rate and Rhythm: Normal rate.  Pulmonary:     Effort: Pulmonary effort is normal. No respiratory distress.  Abdominal:     General: There is no distension.  Musculoskeletal:        General: Normal range of motion.     Cervical back: Normal range of motion.  Neurological:     Mental Status: He is alert.     Motor: Weakness present.     Gait: Gait abnormal.     ED Results / Procedures / Treatments   Labs (all labs ordered are listed, but only abnormal results are displayed) Labs Reviewed  PROTIME-INR - Abnormal; Notable for the following components:      Result Value   Prothrombin Time 26.1 (*)    INR 2.4 (*)    All other components within normal limits  CBC - Abnormal; Notable for the following components:   WBC 14.2 (*)    All other components within normal limits  DIFFERENTIAL - Abnormal; Notable for the following components:   Lymphs Abs 4.7 (*)    Monocytes Absolute 1.4 (*)    Eosinophils Absolute 0.7 (*)    Basophils Absolute 0.3 (*)    All other components within normal limits  COMPREHENSIVE METABOLIC PANEL -  Abnormal; Notable for the following components:   CO2 19 (*)    Glucose, Bld 127 (*)    Creatinine, Ser 1.37 (*)    Alkaline Phosphatase 35 (*)    GFR, Estimated 54 (*)    All other components within normal limits  I-STAT CHEM 8, ED - Abnormal; Notable for the following components:   Creatinine, Ser 1.30 (*)    Glucose, Bld 122 (*)    Calcium,  Ion 1.12 (*)    TCO2 20 (*)    All other components within normal limits  CBG MONITORING, ED - Abnormal; Notable for the following components:   Glucose-Capillary 126 (*)    All other components within normal limits  APTT  ETHANOL  URINALYSIS, W/ REFLEX TO CULTURE (INFECTION SUSPECTED)    EKG None  Radiology DG Chest Portable 1 View  Result Date: 01/02/2023 CLINICAL DATA:  Right leg weakness, possible stroke EXAM: PORTABLE CHEST 1 VIEW COMPARISON:  11/03/2022 CT FINDINGS: Cardiac shadow is enlarged. Changes of prior TAVR are noted. Postsurgical changes are seen. The lungs are well aerated bilaterally. No focal infiltrate is seen. No bony abnormality is noted. IMPRESSION: No acute abnormality noted. Electronically Signed   By: Alcide Clever M.D.   On: 01/02/2023 02:49   CT ANGIO HEAD NECK W WO CM (CODE STROKE)  Result Date: 01/02/2023 CLINICAL DATA:  Initial evaluation for neuro deficit, stroke. EXAM: CT ANGIOGRAPHY HEAD AND NECK WITH AND WITHOUT CONTRAST TECHNIQUE: Multidetector CT imaging of the head and neck was performed using the standard protocol during bolus administration of intravenous contrast. Multiplanar CT image reconstructions and MIPs were obtained to evaluate the vascular anatomy. Carotid stenosis measurements (when applicable) are obtained utilizing NASCET criteria, using the distal internal carotid diameter as the denominator. RADIATION DOSE REDUCTION: This exam was performed according to the departmental dose-optimization program which includes automated exposure control, adjustment of the mA and/or kV according to patient size  and/or use of iterative reconstruction technique. CONTRAST:  75mL OMNIPAQUE IOHEXOL 350 MG/ML SOLN COMPARISON:  CT from earlier the same day. FINDINGS: CTA NECK FINDINGS Aortic arch: Visualized arch within normal limits for caliber. Moderate atheromatous change about the visualized arch. Origin of the great vessels incompletely visualized. No visible stenosis. Right carotid system: Right common and internal carotid arteries are patent without dissection. Moderate calcified plaque about the right carotid bulb without hemodynamically significant greater than 50% stenosis. Left carotid system: Left common and internal carotid arteries are patent without dissection. Moderate calcified plaque about the left carotid bulb with up to approximately 60% stenosis by NASCET criteria. Vertebral arteries: Both vertebral arteries arise from the subclavian arteries. No visible significant proximal subclavian artery stenosis. Vertebral arteries are patent without significant stenosis or dissection. Skeleton: No worrisome osseous lesions. Moderate to advanced spondylosis present at C5-6. Other neck: No other acute finding. Upper chest: No other acute finding. Review of the MIP images confirms the above findings CTA HEAD FINDINGS Anterior circulation: Atheromatous change seen about the carotid siphons with associated up to moderate stenoses bilaterally. A1 segments patent bilaterally. Normal anterior communicating complex. Anterior cerebral arteries patent without stenosis. No M1 stenosis or occlusion. No proximal ECA branch occlusion or high-grade stenosis. Distal MCA branches perfused and symmetric. Posterior circulation: Both V4 segments widely patent. Right vertebral artery slightly dominant. Both PICA patent. Basilar widely patent without stenosis. Superior cerebellar and posterior cerebral arteries patent bilaterally. Venous sinuses: Patent allowing for timing the contrast bolus. Anatomic variants: As above.  No aneurysm. Review  of the MIP images confirms the above findings IMPRESSION: 1. Negative CTA for large vessel occlusion or other emergent finding. 2. Moderate calcified plaque about the carotid bifurcations bilaterally. Resultant 60% stenosis on the left and 40% stenosis on the right. 3. Atheromatous change about the carotid siphons with up to moderate stenoses bilaterally. Aortic Atherosclerosis (ICD10-I70.0). These results were communicated to Dr. Derry Lory at 1:30 am on 01/02/2023 by text page via the [facility] messaging system. Electronically Signed  By: Rise Mu M.D.   On: 01/02/2023 01:52   CT HEAD CODE STROKE WO CONTRAST  Result Date: 01/02/2023 CLINICAL DATA:  Code stroke. Initial evaluation for neuro deficit, stroke. EXAM: CT HEAD WITHOUT CONTRAST TECHNIQUE: Contiguous axial images were obtained from the base of the skull through the vertex without intravenous contrast. RADIATION DOSE REDUCTION: This exam was performed according to the departmental dose-optimization program which includes automated exposure control, adjustment of the mA and/or kV according to patient size and/or use of iterative reconstruction technique. COMPARISON:  None Available. FINDINGS: Brain: Mild age-related cerebral atrophy. No acute intracranial hemorrhage. No acute large vessel territory infarct. No mass lesion, midline shift or mass effect. No hydrocephalus or extra-axial fluid collection. Vascular: No abnormal hyperdense vessel. Scattered calcified atherosclerosis of skull base. Skull: Scalp soft tissues and calvarium within normal limits. Sinuses/Orbits: Globes and orbital soft tissues within normal limits. Paranasal sinuses and mastoid air cells are clear. Other: None. ASPECTS [facility] Stroke Program Early CT Score) - Ganglionic level infarction (caudate, lentiform nuclei, internal capsule, insula, M1-M3 cortex): 7 - Supraganglionic infarction (M4-M6 cortex): 3 Total score (0-10 with 10 being normal): 10 IMPRESSION: 1. No acute  intracranial abnormality. 2. ASPECTS is 10. These results were communicated to Dr. Derry Lory at 1:30 am on 01/02/2023 by text page via the [facility] messaging system. Electronically Signed   By: Rise Mu M.D.   On: 01/02/2023 01:30    Procedures .Critical Care  Performed by: Marily Memos, MD Authorized by: Marily Memos, MD   Critical care provider statement:    Critical care time (minutes):  30   Critical care was necessary to treat or prevent imminent or life-threatening deterioration of the following conditions:  CNS failure or compromise   Critical care was time spent personally by me on the following activities:  Development of treatment plan with patient or surrogate, discussions with consultants, evaluation of patient's response to treatment, examination of patient, ordering and review of laboratory studies, ordering and review of radiographic studies, ordering and performing treatments and interventions, pulse oximetry, re-evaluation of patient's condition and review of old charts   Medications Ordered in ED Medications  sodium chloride flush (NS) 0.9 % injection 3 mL (3 mLs Intravenous Not Given 01/02/23 0149)  iohexol (OMNIPAQUE) 350 MG/ML injection 75 mL (75 mLs Intravenous Contrast Given 01/02/23 0125)    ED Course/ Medical Decision Making/ A&P                                 Medical Decision Making Amount and/or Complexity of Data Reviewed Labs: ordered. Radiology: ordered.  Risk Decision regarding hospitalization.   77 year old male with likely stroke with some persistent symptoms but slowly improving.  Likely needs physical therapy secondary to persistent right-sided symptoms.  CT without any obvious bleed.  Is on Coumadin therapeutic.  Did not get TN K because of being outside the window and being on Coumadin.  No large vessel occlusion on CT.  Discussed with hospitalist for admission.  Final Clinical Impression(s) / ED Diagnoses Final diagnoses:  Ataxia     Rx / DC Orders ED Discharge Orders     None         , Barbara Cower, MD 01/02/23 [phone]

## 2023-01-02 NOTE — ED Notes (Signed)
No report received from previous RN taking care of this pt on prior shift.

## 2023-01-02 NOTE — Evaluation (Signed)
Physical Therapy Evaluation Patient Details Name: Rodney Friedman MRN: [id number] DOB: Apr 02, 1946 Today's Date: 01/02/2023  History of Present Illness  Pt is 77 yo male admitted on 01/02/23 with CVA - acute lacunar infarct of L thalamus and tiny acute left occipital cortex infarct.  Pt with hx including but not limited to CLL, prostate cancer, CAD, diabetes mellitus on metformin, chronic atrial fibrillation on warfarin, history of severe aortic stenosis status post TAVR, dyslipidemia and sleep apnea.  Clinical Impression  Pt admitted with above diagnosis. At baseline, pt lives alone and has limited support.  He is generally very independent and does not require AD.  Pt does have DME including RW, cane, transport chair, grab bars, and shower chair.  Today, pt presents with deficits including decreased R LE strength/coordination/and mild ataxia, decreased balance, safety, and mobility.  He ambulated 76' with min A and RW at slow speed.  Pt does report significant improvement since yesterday.  Pt was able to use R UE throughout exam and was completing crosswords at arrival - cognition appears to be intact.  Pt does not have much support at home, but he does have excellent rehab potential to return to independent/mod I level.  Therapist highly recommends that patient will benefit from intensive inpatient follow up therapy, >3 hours/day at d/c. However, pt politely reports that he is unable to do that and has to go home (concerned about paying bills, finances, other appointments, managing daily task at home).  Encouraged pt to consider for rehab potential and safety.  If he continues to decline post-acute rehab he was agreeable to HHPT.  Pt currently with functional limitations due to the deficits listed below (see PT Problem List). Pt will benefit from acute skilled PT to increase their independence and safety with mobility to allow discharge.           If plan is discharge home, recommend the following: A  little help with walking and/or transfers;A little help with bathing/dressing/bathroom;Assistance with cooking/housework;Help with stairs or ramp for entrance   Can travel by private vehicle        Equipment Recommendations None recommended by PT  Recommendations for Other Services  Rehab consult    Functional Status Assessment Patient has had a recent decline in their functional status and demonstrates the ability to make significant improvements in function in a reasonable and predictable amount of time.     Precautions / Restrictions Precautions Precautions: Fall Restrictions Weight Bearing Restrictions: No      Mobility  Bed Mobility Overal bed mobility: Needs Assistance Bed Mobility: Supine to Sit, Sit to Supine     Supine to sit: Min assist Sit to supine: Supervision   General bed mobility comments: Min A to lift trunk from ED stretcher (noted slight bend in bed at knees and unable to straighten , so pt having to sit up on slope)    Transfers Overall transfer level: Needs assistance Equipment used: Rolling walker (2 wheels) Transfers: Sit to/from Stand Sit to Stand: Min assist           General transfer comment: Light min A to steady    Ambulation/Gait Ambulation/Gait assistance: Min Chemical engineer (Feet): 16 Feet Assistive device: Rolling walker (2 wheels) Gait Pattern/deviations: Step-to pattern, Decreased stride length Gait velocity: decreased     General Gait Details: Decreased speed and step length, reports R LE feels weaker, mild ataxia R LE, fatigued easily  Art therapist  Mobility     Tilt Bed    Modified Rankin (Stroke Patients Only)       Balance Overall balance assessment: Needs assistance Sitting-balance support: No upper extremity supported Sitting balance-Leahy Scale: Good     Standing balance support: Bilateral upper extremity supported, Reliant on assistive device for balance Standing  balance-Leahy Scale: Poor Standing balance comment: Requiring RW and min A at times                             Pertinent Vitals/Pain Pain Assessment Pain Assessment: No/denies pain    Home Living Family/patient expects to be discharged to:: Private residence Living Arrangements: Alone Available Help at Discharge: Family;Available PRN/intermittently;Friend(s) Type of Home: House Home Access: Stairs to enter Entrance Stairs-Rails: Doctor, general practice of Steps: 4   Home Layout: One level Home Equipment: Grab bars - tub/shower;Shower Counsellor (2 wheels);Cane - single point;Transport chair Additional Comments: Pt reports son is single father and not able to assist much.  States he has a couple friends from [employer] could help occasionally.    Prior Function Prior Level of Function : Independent/Modified Independent;Driving             Mobility Comments: could ambulate iin community without AD ADLs Comments: independent with adls and iadls     Extremity/Trunk Assessment   Upper Extremity Assessment Upper Extremity Assessment: Right hand dominant;Defer to OT evaluation (Pt admitted with R sided weakness; He was writing with R hand at arrival and used throughout assessment.  Slight decrease compared to L UE but defer to OT for further detail)    Lower Extremity Assessment Lower Extremity Assessment: LLE deficits/detail;RLE deficits/detail RLE Deficits / Details: ROM WFL; MMT 5/5 ankle, 5/5 knee ext, 5/5 hip flex RLE Sensation: WNL RLE Coordination: decreased gross motor (slight decrease with heel/shin, and mild ataxia) LLE Deficits / Details: ROM WFL; MMT 5/5 LLE Sensation: WNL    Cervical / Trunk Assessment Cervical / Trunk Assessment: Normal  Communication      Cognition Arousal: Alert Behavior During Therapy: WFL for tasks assessed/performed Overall Cognitive Status: Within Functional Limits for tasks assessed                                  General Comments: Overall WFL; pt completing crossword puzzles when PT arrived; some decreased insight to deficits and ability to return home (more just determined and feels that he needs to return home for financial and logistical purposes)        General Comments General comments (skin integrity, edema, etc.): Pt denies any changes in his vision. Educated pt on PT recommendations for post-acute rehab due to well below baseline, would benefit from intense therapy, good rehab potential, fall risk, and limited support at home. Pt politely reports "that just won't work, I have to go home." Expressed need to take care of daily task at home, pay bills on his computer (not laptop), attend to all of his other appointments (explained MD offices could reschedule), etc. He was open to HHPT. Discussed would still recommend rehab for his recovery and safety but decision is his. Did discuss if he decides to go home discussed safety recommendations including assist for stairs, could use transport chair to sit while in kitchen prepping food or other task with standing, using shower chair, keeping phone close, not driving.    Exercises  Assessment/Plan    PT Assessment Patient needs continued PT services  PT Problem List Decreased strength;Decreased coordination;Decreased range of motion;Decreased activity tolerance;Decreased knowledge of use of DME;Decreased balance;Decreased safety awareness;Decreased mobility;Decreased knowledge of precautions       PT Treatment Interventions DME instruction;Therapeutic exercise;Gait training;Balance training;Stair training;Functional mobility training;Therapeutic activities;Patient/family education;Neuromuscular re-education    PT Goals (Current goals can be found in the Care Plan section)  Acute Rehab PT Goals Patient Stated Goal: return home PT Goal Formulation: With patient Time For Goal Achievement: 01/16/23 Potential to Achieve Goals:  Good    Frequency Min 1X/week     Co-evaluation               AM-PAC PT "6 Clicks" Mobility  Outcome Measure Help needed turning from your back to your side while in a flat bed without using bedrails?: A Little Help needed moving from lying on your back to sitting on the side of a flat bed without using bedrails?: A Little Help needed moving to and from a bed to a chair (including a wheelchair)?: A Little Help needed standing up from a chair using your arms (e.g., wheelchair or bedside chair)?: A Little Help needed to walk in hospital room?: Total (<20') Help needed climbing 3-5 steps with a railing? : Total 6 Click Score: 14    End of Session Equipment Utilized During Treatment: Gait belt Activity Tolerance: Patient tolerated treatment well Patient left: in bed;with call bell/phone within reach;with nursing/sitter in room (in ED) Nurse Communication: Mobility status PT Visit Diagnosis: Other abnormalities of gait and mobility (R26.89);Hemiplegia and hemiparesis Hemiplegia - Right/Left: Right Hemiplegia - dominant/non-dominant: Dominant Hemiplegia - caused by: Cerebral infarction    Time: 1232-1305 PT Time Calculation (min) (ACUTE ONLY): 33 min   Charges:   PT Evaluation $PT Eval Low Complexity: 1 Low PT Treatments $Gait Training: 8-22 mins PT General Charges $$ ACUTE PT VISIT: 1 Visit         Anise Salvo, PT Acute Rehab Services [facility] [phone]   Rayetta Humphrey 01/02/2023, 2:01 PM

## 2023-01-02 NOTE — Progress Notes (Addendum)
ANTICOAGULATION CONSULT NOTE - Initial Consult  Pharmacy Consult for Warfarin Indication: atrial fibrillation in the setting of acute ischemic stroke  Allergies  Allergen Reactions   Other Rash    Allergen: "Plants and bushes while doing yard workAdministrator, Civil Service Extract Rash    Patient Measurements: Height: 5\' 8"  (172.7 cm) Weight: 95.4 kg (210 lb 5.1 oz) IBW/kg (Calculated) : 68.4 Heparin dosing weight: 88.5 kg  Vital Signs: Temp: 97.8 F (36.6 C) (08/13 0818) BP: 151/77 (08/13 0440) Pulse Rate: 88 (08/13 0440)  Labs: Recent Labs    01/02/23 0115 01/02/23 0120  HGB 14.9 15.0  HCT 44.1 44.0  PLT 175  --   APTT 35  --   LABPROT 26.1*  --   INR 2.4*  --   CREATININE 1.37* 1.30*    Estimated Creatinine Clearance: 55 mL/min (A) (by C-G formula based on SCr of 1.3 mg/dL (H)).   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 Clifton James & Dr. Laneta Simmers    Sleep apnea    Substance abuse [facility])    Tremor, essential 09/22/2015    Medications:  (Not in a hospital admission)   Assessment: 77 yo M presents to ED with R leg weakness and is noted to have RUE and RLE ataxia. Code Stroke was activated and pt was found to have and acute lacunar infarct in L thalamus and tiny acute infarct in L occipital cortex on MRI head, but no thrombolytic therapy was given due to his warfarin. Pt has a PMH of Afib (on warfarin), CLL, heart murmur, MI, osteopenia, osteoporosis, T2DM, HTN, and HLD. Pharmacy was consulted to dose warfarin. Neurology ok with resuming warfarin this evening.  Patient's home warfarin regimen is: 2mg  T/Th/Sa/Sun and 4mg  on MWF. Last dose was 4mg  on 8/12 @ 06:30. Patient usually takes home warfarin doses between 0600-0700 daily.  INR 2.4, on admission is therapeutic Hgb 15, Plt 175  No s/sx of bleeding  Goal of Therapy:  INR 2-3 Heparin level 0.3-0.7 units/ml Monitor platelets by anticoagulation protocol: Yes   Plan:  Give warfarin 4 mg x1 at 1600 tonight Monitor daily CBC, INR, and for s/sx of bleeding   Wilburn Cornelia, PharmD, BCPS Clinical Pharmacist 01/02/2023 3:16 PM  Please refer to [facility] for pharmacy phone number

## 2023-01-02 NOTE — ED Notes (Signed)
Pt's neighbor, Memory Argue, [phone]

## 2023-01-02 NOTE — Consult Note (Signed)
NEUROLOGY CONSULTATION NOTE   Date of service: January 02, 2023 Patient Name: Rodney Friedman MRN:  [id number] DOB:  30-Aug-1945 Reason for consult: "stroke code" Requesting Provider: Marily Memos, MD _ _ _   _ __   _ __ _ _  __ __   _ __   __ _  History of Present Illness  Demitry Shelor is a 77 y.o. 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 Went to bed at 8pm. He got up at 10pm to use the bathroom and R leg was weak and he almost fell. Helped himself to the ground. Called his friend and then later called EMS.  He was brought in as a code stroke.  LKW: 2000 on 01/01/23. mRS: 0 tNKASE: not offered, outside window. He is also on warfarin. Thrombectomy: not offered, no LVO NIHSS components Score: Comment  1a Level of Conscious 0[x]  1[]  2[]  3[]      1b LOC Questions 0[x]  1[]  2[]       1c LOC Commands 0[x]  1[]  2[]       2 Best Gaze 0[x]  1[]  2[]       3 Visual 0[x]  1[]  2[]  3[]      4 Facial Palsy 0[]  1[x]  2[]  3[]      5a Motor Arm - left 0[x]  1[]  2[]  3[]  4[]  UN[]    5b Motor Arm - Right 0[x]  1[]  2[]  3[]  4[]  UN[]    6a Motor Leg - Left 0[x]  1[]  2[]  3[]  4[]  UN[]    6b Motor Leg - Right 0[]  1[x]  2[]  3[]  4[]  UN[]    7 Limb Ataxia 0[]  1[]  2[x]  3[]  UN[]     8 Sensory 0[x]  1[]  2[]  UN[]      9 Best Language 0[x]  1[]  2[]  3[]      10 Dysarthria 0[]  1[x]  2[]  UN[]      11 Extinct. and Inattention 0[x]  1[]  2[]       TOTAL: 5     ROS   Constitutional Denies weight loss, fever and chills.   HEENT Denies changes in vision and hearing.   Respiratory Denies SOB and cough.   CV Denies palpitations and CP   GI Denies abdominal pain, nausea, vomiting and diarrhea.   GU Denies dysuria and urinary frequency.   MSK Denies myalgia and joint pain.   Skin Denies rash and pruritus.   Neurological Denies headache and syncope.   Psychiatric Denies recent changes in mood. Denies anxiety and  depression.    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 Clifton James & Dr. 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/22/2015   Past Surgical History:  Procedure Laterality Date   Arm Surgery Right    from door accident with glass   CARDIAC CATHETERIZATION     CATARACT EXTRACTION W/ INTRAOCULAR LENS IMPLANT Bilateral    COLONOSCOPY     CORONARY ARTERY BYPASS GRAFT  08/04/2004   EYE SURGERY     POLYPECTOMY     RIGHT/LEFT HEART CATH AND CORONARY/GRAFT ANGIOGRAPHY N/A 01/20/2021   Procedure: RIGHT/LEFT HEART CATH AND CORONARY/GRAFT ANGIOGRAPHY;  Surgeon: Kathleene Hazel, MD;  Location: [facility] INVASIVE CV LAB;  Service: Cardiovascular;  Laterality: N/A;   TEE WITHOUT CARDIOVERSION N/A 02/08/2021   Procedure: TRANSESOPHAGEAL ECHOCARDIOGRAM (TEE);  Surgeon: Kathleene Hazel, MD;  Location: [facility] INVASIVE CV LAB;  Service: Open Heart  Surgery;  Laterality: N/A;   TRANSCATHETER AORTIC VALVE REPLACEMENT, CAROTID Left 02/08/2021   Procedure: TRANSCATHETER AORTIC VALVE REPLACEMENT, LEFT CAROTID;  Surgeon: Kathleene Hazel, MD;  Location: [facility] INVASIVE CV LAB;  Service: Open Heart Surgery;  Laterality: Left;   Family History  Problem Relation Age of Onset   Colon cancer Mother    Ovarian cancer Mother    Uterine cancer Mother    Hypertension Father    Prostate cancer Father    Colon polyps Neg Hx    Social History   Socioeconomic History   Marital status: Divorced    Spouse name: Not on file   Number of children: 2   Years of education: 59   Highest education level: Not on file  Occupational History   Occupation: Retired Runner, broadcasting/film/video  Tobacco Use   Smoking status: Former    Current packs/day: 0.00    Average packs/day: 0.8 packs/day for 59.0 years (44.3 ttl pk-yrs)    Types: Cigarettes    Start date: 12/16/1961    Quit date: 12/16/2020    Years since quitting: 2.0   Smokeless tobacco: Never  Vaping Use   Vaping status: Never Used  Substance and Sexual Activity   Alcohol use: No    Alcohol/week: 0.0 standard drinks of alcohol   Drug use: No   Sexual activity: Not Currently  Other Topics Concern   Not on file  Social History Narrative   Lives alone   Divorced   Right-handed   Education: College   No caffeine   Social Determinants of Health   Financial Resource Strain: Low Risk  (05/18/2022)   Overall Financial Resource Strain (CARDIA)    Difficulty of Paying Living Expenses: Not hard at all  Food Insecurity: No Food Insecurity (05/18/2022)   Hunger Vital Sign    Worried About Running Out of Food in the Last Year: Never true    Ran Out of Food in the Last Year: Never true  Transportation Needs: No Transportation Needs (05/18/2022)   PRAPARE - Administrator, Civil Service (Medical): No    Lack of Transportation (Non-Medical): No  Physical Activity: Insufficiently Active (05/18/2022)    Exercise Vital Sign    Days of Exercise per Week: 5 days    Minutes of Exercise per Session: 20  min  Stress: Stress Concern Present (05/18/2022)   [employer] of Occupational Health - Occupational Stress Questionnaire    Feeling of Stress : To some extent  Social Connections: Moderately Integrated (05/18/2022)   Social Connection and Isolation Panel [NHANES]    Frequency of Communication with Friends and Family: More than three times a week    Frequency of Social Gatherings with Friends and Family: More than three times a week    Attends Religious Services: More than 4 times per year    Active Member of Clubs or Organizations: Yes    Attends Engineer, structural: More than 4 times per year    Marital Status: Divorced   Allergies  Allergen Reactions   Other Rash    Allergen: "Plants and bushes while doing yard workAdministrator, Civil Service Extract Rash    Medications  (Not in a hospital admission)    Vitals   Vitals:   01/02/23 0140 01/02/23 0144 01/02/23 0148 01/02/23 0200  BP:    (!) 147/70  Pulse: 81 66 66 79  Resp: 20 18 17  (!) 23  SpO2: 100% 100% 98% 97%  Weight:         Body mass index is 31.98 kg/m.  Physical Exam   General: Laying comfortably in bed; in no acute distress.  HENT: Normal oropharynx and mucosa. Normal external appearance of ears and nose.  Neck: Supple, no pain or tenderness  CV: No JVD. No peripheral edema.  Pulmonary: Symmetric Chest rise. Normal respiratory effort.  Abdomen: Soft to touch, non-tender.  Ext: No cyanosis, edema, or deformity  Skin: No rash. Normal palpation of skin.   Musculoskeletal: Normal digits and nails by inspection. No clubbing.   Neurologic Examination  Mental status/Cognition: Alert, oriented to self, place, month and year, good attention.  Speech/language: dysarthric speech, fluent, comprehension intact, object naming intact, repetition intact.  Cranial nerves:   CN II Pupils equal and reactive to light, no  VF deficits    CN III,IV,VI EOM intact, no gaze preference or deviation, no nystagmus    CN V normal sensation in V1, V2, and V3 segments bilaterally    CN VII Mild R facial droop   CN VIII normal hearing to speech    CN IX & X normal palatal elevation, no uvular deviation    CN XI 5/5 head turn and 5/5 shoulder shrug bilaterally    CN XII midline tongue protrusion    Motor:  Muscle bulk: normal, tone normal. Mvmt Root Nerve  Muscle Right Left Comments  SA C5/6 Ax Deltoid 5 5   EF C5/6 Mc Biceps 5 5   EE C6/7/8 Rad Triceps 5 5   WF C6/7 Med FCR     WE C7/8 PIN ECU     F Ab C8/T1 U ADM/FDI 5 5   HF L1/2/3 Fem Illopsoas 4+ 5   KE L2/3/4 Fem Quad 5 5   DF L4/5 D Peron Tib Ant 5 5   PF S1/2 Tibial Grc/Sol 5 5    Sensation:  Light touch Intact throughout   Pin prick    Temperature    Vibration   Proprioception    Coordination/Complex Motor:  - Finger to Nose with ataxia in RUE - Heel to shin with ataxia in RLE - Rapid alternating movement are slowed on the right. - Gait: deferred for patient safety.  Labs   CBC:  Recent Labs  Lab 01/02/23 0115 01/02/23 0120  WBC 14.2*  --  NEUTROABS 7.1  --   HGB 14.9 15.0  HCT 44.1 44.0  MCV 89.3  --   PLT 175  --     Basic Metabolic Panel:  Lab Results  Component Value Date   NA 142 01/02/2023   K 4.0 01/02/2023   CO2 19 (L) 01/02/2023   GLUCOSE 122 (H) 01/02/2023   BUN 15 01/02/2023   CREATININE 1.30 (H) 01/02/2023   CALCIUM 9.4 01/02/2023   GFRNONAA 54 (L) 01/02/2023   GFRAA 79 04/26/2020   Lipid Panel:  Lab Results  Component Value Date   LDLCALC 42 05/26/2022   HgbA1c:  Lab Results  Component Value Date   HGBA1C 6.9 (A) 11/27/2022   Urine Drug Screen: No results found for: "LABOPIA", "COCAINSCRNUR", "LABBENZ", "AMPHETMU", "THCU", "LABBARB"  Alcohol Level     Component Value Date/Time   ETH <10 01/02/2023 0115    CT Head without contrast(Personally reviewed): CTH was negative for a large hypodensity  concerning for a large territory infarct or hyperdensity concerning for an ICH  CT angio Head and Neck with contrast(Personally reviewed): No LVO  MRI Brain: pending  Impression   Constant Freyre is a 77 y.o. 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 Presentation is concerning for an acute stroke. His neurologic examination is notable for RUE and RLE ataxia.  His ataxia does impart increased risk of falls.  Recommendations  - Frequent Neuro checks per stroke unit protocol - Recommend brain imaging with MRI Brain without contrast - no need for TTE since he has known hx of afibb and is already on warfarin. - Recommend obtaining Lipid panel with LDL - Please start statin if LDL > 70 - Recommend HbA1c to evaluate for diabetes and how well it is controlled. - Antithrombotic - hold warfarin, aspirin this AM. Time of resumption of warfarin based on size of stroke on MRI. - Recommend DVT ppx for now, hold when resuming his warfarin. - SBP goal - permissive hypertension first 24 h < 220/110. Held home meds.  - Recommend Telemetry monitoring for arrythmia - Recommend bedside swallow screen prior to PO intake. - Stroke education booklet - Recommend PT/OT/SLP consult - stroke team to follow.  ______________________________________________________________________   Thank you for the opportunity to take part in the care of this patient. If you have any further questions, please contact the neurology consultation attending.  Signed,  Erick Blinks [facility] _ _ _   _ __   _ __ _ _  __ __   _ __   __ _

## 2023-01-02 NOTE — ED Notes (Signed)
PT went to MRI  

## 2023-01-02 NOTE — H&P (Addendum)
History and Physical    Patient: Rodney Friedman XBJ:[id number] DOB: [birth year] DOA: 01/02/2023 DOS: the patient was seen and examined on 01/02/2023 PCP: Ivonne [name], NP  Patient coming from: Home  Chief Complaint:  Chief Complaint  Patient presents with   Code Stroke   HPI: Rodney Friedman is a 77 y.o. male with medical history significant of CLL, prostate cancer, CAD, diabetes mellitus on metformin, chronic atrial fibrillation on warfarin, history of severe aortic stenosis status post TAVR, dyslipidemia and sleep apnea.  Patient does not have a prior history of stroke.  Patient initially noted symptoms around 9 PM on Monday when he got up to ambulate his knee gave way and he fell to the ground.  He spent much of the day on Tuesday on the floor and unable to ambulate.  He eventually crawled around his home and he got to the front door where he was able to reach his telephone and call his neighbor for assistance.  The neighbor came over and helped him and eventually EMS was called.  All of this took several hours and EMS presented to the home sometime after 11 PM.  Patient presented to the ED as a code stroke.  Upon arrival to the ED he was minimally hypertensive with a blood pressure of 147/70.  All other vital signs were stable and he was not hypoxemic.  CT head with code stroke protocol revealed no acute intracranial abnormality.  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  Tiny acute left occipital cortex infarct.  Thrombolytics were not initiated because he was outside the window for administration and patient was also on chronic anticoagulation with warfarin.  He was also found to not have any large vessel occlusion so thrombectomy was also not indicated.  On exam he had slurred speech and notable right facial droop without drooling.  He was also experiencing right-sided weakness most notable with ambulation.  Neurology was consulted  and recommendations noted.  Hospitalist service was asked to evaluate the patient for admission.  Review of Systems: As mentioned in the history of present illness. All other systems reviewed and are negative. 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 Clifton James & Dr. Laneta Simmers  Sleep apnea    Substance abuse (HCC)    Tremor, essential 09/22/2015   Past Surgical History:  Procedure Laterality Date   Arm Surgery Right    from door accident with glass   CARDIAC CATHETERIZATION     CATARACT EXTRACTION W/ INTRAOCULAR LENS IMPLANT Bilateral    COLONOSCOPY     CORONARY ARTERY BYPASS GRAFT  08/04/2004   EYE SURGERY     POLYPECTOMY     RIGHT/LEFT HEART CATH AND CORONARY/GRAFT ANGIOGRAPHY N/A 01/20/2021   Procedure: RIGHT/LEFT HEART CATH AND CORONARY/GRAFT ANGIOGRAPHY;  Surgeon: Kathleene Hazel, MD;  Location: [facility] INVASIVE CV LAB;  Service: Cardiovascular;  Laterality: N/A;   TEE  WITHOUT CARDIOVERSION N/A 02/08/2021   Procedure: TRANSESOPHAGEAL ECHOCARDIOGRAM (TEE);  Surgeon: Kathleene Hazel, MD;  Location: [facility] INVASIVE CV LAB;  Service: Open Heart Surgery;  Laterality: N/A;   TRANSCATHETER AORTIC VALVE REPLACEMENT, CAROTID Left 02/08/2021   Procedure: TRANSCATHETER AORTIC VALVE REPLACEMENT, LEFT CAROTID;  Surgeon: Kathleene Hazel, MD;  Location: [facility] INVASIVE CV LAB;  Service: Open Heart Surgery;  Laterality: Left;   Social History:  reports that he quit smoking about 2 years ago. His smoking use included cigarettes. He started smoking about 61 years ago. He has a 44.3 pack-year smoking history. He has never used smokeless tobacco. He reports that he does not drink alcohol and does not use drugs.  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 Authorizing Provider  albuterol (VENTOLIN HFA) 108 (90 Base) MCG/ACT inhaler Inhale 2 puffs into the lungs every 6 (six) hours as needed for wheezing or shortness of breath. 11/27/22   Ivonne [name], NP  amoxicillin (AMOXIL) 500 MG tablet Take 4 tablets (2000 mg) by mouth ONE HOUR before any dental procedures. 01/27/22   Janetta Hora, PA-C  ascorbic acid (VITAMIN C) 1000 MG tablet Take 1,000 mg by mouth daily. 09/09/20   [provider]  ascorbic acid (VITAMIN C) 500 MG tablet Take 1-2 tablets (500-1,000 mg total) by mouth See admin instructions. Alternate taking 500 mg one day and 1000 mg the next 11/27/22   Ivonne [name], NP  B Complex Vitamins (VITAMIN-B COMPLEX PO) Take 1 tablet by mouth daily with supper.    [provider]  BIOTIN PO  Take 1 tablet by mouth every evening.    [provider]  buPROPion (WELLBUTRIN SR) 200 MG 12 hr tablet Take 1 tablet (200 mg total) by mouth every morning. 08/08/22   Archer Asa, MD  buPROPion ER [facility] SR) 100 MG 12 hr tablet 1 qhs 08/08/22   Plovsky, Earvin Hansen, MD  Calcium Carb-Cholecalciferol 500-10 MG-MCG TABS Take 1 tablet by mouth daily in the afternoon. 09/09/20   [provider]  Calcium Carbonate-Vit D-Min (CALCIUM 1200 PO) Take 1,200 mg by mouth every evening.    [provider]  calcium elemental as carbonate (TUMS ULTRA 1000) 400 MG chewable tablet Chew 1,000 mg by mouth daily as needed for heartburn.    [provider]  Cholecalciferol 1.25 MG (50000  UT) capsule Take 50,000 Units by mouth. Every other Saturday 09/09/20   [provider]  docusate sodium (COLACE) 100 MG capsule Take 1 capsule (100 mg total) by mouth every evening. 11/27/22   Ivonne [name], NP  ezetimibe (ZETIA) 10 MG tablet Take 1 tablet (10 mg total) by mouth daily. 11/27/22   Ivonne [name], NP  gabapentin (NEURONTIN) 100 MG capsule 3  qhs 08/08/22   Plovsky, Earvin Hansen, MD  ketoconazole (NIZORAL) 2 % shampoo Apply 1 application topically 2 (two) times a week. 04/26/20   Shade Flood, MD  latanoprost (XALATAN) 0.005 % ophthalmic solution Place 1 drop into the left eye every other day. 06/30/13   [provider]  LYCOPENE PO Take 10 mg by mouth every other day.    [provider]  Magnesium 250 MG TABS Take 250 mg by mouth every 3 (three) days.    [provider]  metFORMIN (GLUCOPHAGE) 500 MG tablet Take 1 tablet (500 mg total) by mouth 2 (two) times daily with a meal. 11/27/22   Ivonne [name], NP  metoprolol succinate (TOPROL-XL) 50 MG 24 hr tablet Take 1 tablet (50 mg total) by mouth 2 (two) times daily. Take with or immediately following a meal. 11/27/22   Ivonne [name], NP  niacin (VITAMIN B3) 500 MG tablet Take 500 mg by mouth 3 (three)  times a week.    [provider]  Omega-3 Fatty Acids (FISH OIL) 1000 MG CAPS Take 1,000 mg by mouth daily with supper.    [provider]  prednisoLONE acetate (PRED FORTE) 1 % ophthalmic suspension Place 1 drop into both eyes. Every other day 10/01/20   [provider]  rosuvastatin (CRESTOR) 20 MG tablet Take 1 tablet (20 mg total) by mouth daily. 11/27/22   Ivonne [name], NP  Sodium Fluoride (PREVIDENT 5000 DRY MOUTH DT) Place 1 application onto teeth See admin instructions. Apply to gums every night for dry mouth    [provider]  vitamin B-12 (CYANOCOBALAMIN) 500 MCG tablet Take 1 tablet (500 mcg total) by mouth every other day. 11/27/22   Ivonne [name], NP  Vitamin D, Ergocalciferol, (DRISDOL) 1.25 MG (50000 UNIT) CAPS capsule Take 1 capsule (50,000 Units total) by mouth every Saturday. 12/02/22 03/02/23  Ivonne [name], NP  warfarin (COUMADIN) 4 MG tablet TAKE 1/2 TO 1 TABLET DAILY AS DIRECTED BY COUMADIN CLINIC 07/28/22   Croitoru, Rachelle Hora, MD    Physical Exam: Vitals:   01/02/23 0148 01/02/23 0200 01/02/23 0440 01/02/23 0818  BP:  (!) 147/70 (!) 151/77   Pulse: 66 79 88   Resp: 17 (!) 23 18   Temp:    97.8 F (36.6 C)  SpO2: 98% 97% 99%   Weight:        Constitutional: NAD, calm, comfortable Respiratory: clear to auscultation bilaterally, no wheezing, no crackles. Normal respiratory effort. No accessory muscle use.  Cardiovascular: Regular rate and rhythm, no murmurs / rubs / gallops. No extremity edema. 2+ pedal pulses. No carotid bruits.  Abdomen: no tenderness, no masses palpated. No hepatosplenomegaly. Bowel sounds positive.  Musculoskeletal: no clubbing / cyanosis. No joint deformity upper and lower extremities. Good ROM, no contractures. Normal muscle tone.  Skin: no rashes, lesions, ulcers. No induration Neurologic: CN 2-12 grossly intact except for finding of right facial droop with subtle tongue deviation to the right. Sensation  intact, DTR normal. Strength 5/5 on the left.  Right upper extremity grip slightly weak and  notable right upper extremity rhythmic motion with handgrip squeeze test.  Shoulder strength is equal.  Some difficulty with right heel-to-shin test. Psychiatric: Normal judgment and insight. Alert and oriented x 3. Normal mood.    Data Reviewed:  Sodium 139, potassium 3.9, chloride 107, CO2 19, glucose 127, BUN 13, creatinine 1.37, GFR 54, LFTs within normal limits  White count 14,200 with subtle abnormalities in differential related to lymphocytes and monocytes absolute eosinophils and absolute basophil's.  PT 26.1, INR 2.4, PTT 35  Urinalysis within normal limits except for slightly elevated specific gravity of 1.043  Ethyl alcohol <10  Code stroke protocol CT of head and MRI as per HPI  EKG with underlying atrial fibrillation ventricular rate of 74 bpm and unchanged when compared to previous EKG.  Assessment and Plan: Acute ischemic stroke Confirmed by MRI with area noted in the left thalamus as well as in the occipital region INR currently therapeutic but patient did state that INR had dropped below 2 for 6 to 8 days recently noting 2 months ago his INR was supratherapeutic at 4.7 and required dose adjustments Appreciate neurology assistance-recommend hold warfarin until follow-up MRI in 48 hours.  Wish to avoid hemorrhagic transformation if possible Check hemoglobin A1c and lipid panel Continue Crestor 20 mg and Zetia 10 mg daily Permissive hypertension therefore we will hold home Toprol-XL SLP/PT/OT evaluation Patient lives at home alone and has concerns about how stroke will affect his ability to maintain his independence-amenable to CIR if meets criteria  Hypertension Toprol on hold as above  Diabetes mellitus Holding metformin for 48 hours since received IV contrast during stroke evaluation Follow CBGs and provide SSI Follow-up on hemoglobin A1c.  Last hemoglobin A1c in July was  6.9  Chronic atrial fibrillation On beta-blocker for rate control-will allow for as needed IV Lopressor for tachycardia but will need to watch blood pressure to avoid relative hypotension in setting of acute CVA Warfarin on hold until follow-up MRI as above  CAD, history of severe AS status post TAVR Current EKG stable Primary cardiologist is Dr. Royann Shivers  CLL/history of prostate cancer Follows with urology regarding prostate cancer and PSA trending Follows with Dr. Bertis Ruddy regarding CLL.  Last visit was March 2024 with no clinical signs of disease progression with recommendation to follow-up in 1 year  OSA Monitor for nocturnal hypoxemia Did not tolerate CPAP therapy in the past  COPD History of tobacco abuse and has not smoked for 25 months Was as an outpatient with pulmonary medicine As needed Proventil nebulizer for symptoms    Advance Care Planning:   Code Status: Full Code   VTE prophylaxis: Was on warfarin at home with current INR greater than 2.  Currently neurology holding warfarin for 48 hours  Consults: Neurology  Family Communication: Patient only  Severity of Illness: The appropriate patient status for this patient is OBSERVATION. Observation status is judged to be reasonable and necessary in order to provide the required intensity of service to ensure the patient's safety. The patient's presenting symptoms, physical exam findings, and initial radiographic and laboratory data in the context of their medical condition is felt to place them at decreased risk for further clinical deterioration. Furthermore, it is anticipated that the patient will be medically stable for discharge from the hospital within 2 midnights of admission.   Author: Junious Silk, NP 01/02/2023 8:37 AM  For on call review [email or web address].

## 2023-01-02 NOTE — ED Notes (Signed)
ED TO INPATIENT HANDOFF REPORT  ED Nurse Name and Phone #: Nehemiah Settle [phone]  S Name/Age/Gender Rodney Friedman 77 y.o. male Room/Bed: 037C/037C  Code Status   Code Status: Full Code  Home/SNF/Other Rehab Patient oriented to: self, place, time, and situation Is this baseline? Yes   Triage Complete: Triage complete  Chief Complaint Acute ischemic stroke [facility]) [I63.9]  Triage Note Pt LKW at 8pm by neighbor.  Pt states he got up about 10 pm and had a fall d/t right leg weakness.  Pt states similar symptoms yesterday but that they resolved.  Pt has facial droop on the right, decreased sensation, right sided limb ataxia.  Pt alert and oriented able to answer questions appropriately.  Pt is on coumadin .     Allergies Allergies  Allergen Reactions   Other Rash    Allergen: "Plants and bushes while doing yard workAdministrator, Civil Service Extract Rash    Level of Care/Admitting Diagnosis ED Disposition     ED Disposition  Admit   Condition  --   Comment  Hospital Area: [facility] [[id number]]  Level of Care: Telemetry Medical [[id number]]  May place patient in observation at [name] or [facility] if equivalent level of care is available:: No  Covid Evaluation: Asymptomatic - no recent exposure (last 10 days) testing not required  Diagnosis: Acute ischemic stroke [facility]) [[id number]]  Admitting Physician: Marcelino Duster [[id number]]  Attending Physician: Marcelino Duster [[id number]]          B Medical/Surgery History Past Medical History:  Diagnosis Date   Adenomatous colon polyp    Alcoholism (HCC)    Anxiety    Atrial fibrillation (HCC)    CAD (coronary artery disease)    Cataract    removed left eye    CHF (congestive heart failure) (HCC) 12/16/2020   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 Clifton James & Dr. 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   Past Surgical History:  Procedure Laterality Date   Arm Surgery Right    from door accident with glass   CARDIAC CATHETERIZATION     CATARACT EXTRACTION W/ INTRAOCULAR LENS IMPLANT Bilateral    COLONOSCOPY     CORONARY ARTERY BYPASS GRAFT  08/04/2004   EYE SURGERY     POLYPECTOMY     RIGHT/LEFT HEART CATH AND CORONARY/GRAFT ANGIOGRAPHY N/A 01/20/2021   Procedure: RIGHT/LEFT HEART CATH AND CORONARY/GRAFT ANGIOGRAPHY;  Surgeon: Kathleene Hazel, MD;  Location: [facility] INVASIVE CV LAB;  Service: Cardiovascular;  Laterality: N/A;   TEE WITHOUT CARDIOVERSION N/A 02/08/2021   Procedure: TRANSESOPHAGEAL ECHOCARDIOGRAM (TEE);  Surgeon: Kathleene Hazel, MD;  Location[facility] INVAS[facility] CV LAB;  Service: Open Heart Surgery;  Laterality: N/A;   TRANSCATHETER AORTIC VALVE REPLACEMENT, CAROTID Left 02/08/2021   Procedure: TRANSCATHETER AORTIC VALVE REPLACEMENT, LEFT CAROTID;  Surgeon: Kathleene Hazel, MD;  Location: [facility] INVASIVE CV LAB;  Service: Open Heart Surgery;  Laterality: Left;     A IV Location/Drains/Wounds Patient Lines/Drains/Airways Status     Active Line/Drains/Airways     Name Placement date Placement time Site Days   Peripheral IV 01/02/23 18 G 1.16" Right  Antecubital 01/02/23  0126  Antecubital  less than 1   Peripheral IV 01/02/23 20 G Left;Posterior Forearm 01/02/23  1219  Forearm  less than 1   Wound / Incision (Open or Dehisced) 02/08/21 Thigh Anterior;Proximal;Right 02/08/21  1439  Thigh  693            Intake/Output Last 24 hours No intake or output data in the 24 hours ending 01/02/23 1411  Labs/Imaging Results for orders placed or performed during the hospital encounter of 01/02/23 (from the past 48 hour(s))  Protime-INR     Status: Abnormal   Collection Time: 01/02/23  1:15 AM  Result Value Ref Range   Prothrombin Time 26.1 (H) 11.4 - 15.2 seconds   INR 2.4 (H) 0.8 - 1.2    Comment: (NOTE) INR goal varies based on device and disease states. Performed at [facility], [address], [place], [place] [address]   APTT     Status: None   Collection Time: 01/02/23  1:15 AM  Result Value Ref Range   aPTT 35 24 - 36 seconds    Comment: Performed at [facility], [address] [place], [place] [address]  CBC     Status: Abnormal   Collection Time: 01/02/23  1:15 AM  Result Value Ref Range   WBC 14.2 (H) 4.0 - 10.5 K/uL   RBC 4.94 4.22 - 5.81 MIL/uL   Hemoglobin 14.9 13.0 - 17.0 g/dL   HCT 63.8 75.6 - 43.3 %   MCV 89.3 80.0 - 100.0 fL   MCH 30.2 26.0 - 34.0 pg   MCHC 33.8 30.0 - 36.0 g/dL   RDW 29.5 18.8 - 41.6 %   Platelets 175 150 - 400 K/uL   nRBC 0.0 0.0 - 0.2 %    Comment: Performed at [facility], [address] [place], [place] [address]  Differential     Status: Abnormal   Collection Time: 01/02/23  1:15 AM  Result Value Ref Range   Neutrophils Relative % 50 %   Neutro Abs 7.1 1.7 - 7.7 K/uL   Lymphocytes Relative 33 %   Lymphs Abs 4.7 (H) 0.7 - 4.0 K/uL   Monocytes Relative 10 %   Monocytes Absolute 1.4 (H) 0.1 - 1.0 K/uL   Eosinophils Relative 5 %   Eosinophils Absolute 0.7 (H) 0.0 - 0.5 K/uL   Basophils Relative 2 %   Basophils Absolute 0.3 (H) 0.0 - 0.1 K/uL   WBC Morphology  See Note     Comment: Smudge Cells   nRBC 0 0 /100 WBC   Abs Immature Granulocytes 0.00 0.00 - 0.07 K/uL    Comment: Performed at [facility], [address], [place], [place] [address]  Comprehensive metabolic panel     Status: Abnormal   Collection Time: 01/02/23  1:15  AM  Result Value Ref Range   Sodium 139 135 - 145 mmol/L   Potassium 3.9 3.5 - 5.1 mmol/L   Chloride 107 98 - 111 mmol/L   CO2 19 (L) 22 - 32 mmol/L   Glucose, Bld 127 (H) 70 - 99 mg/dL    Comment: Glucose reference range applies only to samples taken after fasting for at least 8 hours.   BUN 13 8 - 23 mg/dL   Creatinine, Ser 4.09 (H) 0.61 - 1.24 mg/dL   Calcium 9.4 8.9 - 81.1 mg/dL   Total Protein 6.6 6.5 - 8.1 g/dL   Albumin 3.8 3.5 - 5.0 g/dL   AST 31 15 - 41 U/L   ALT 25 0 - 44 U/L   Alkaline Phosphatase 35 (L) 38 - 126 U/L   Total Bilirubin 0.9 0.3 - 1.2 mg/dL   GFR, Estimated 54 (L) >60 mL/min    Comment: (NOTE) Calculated using the CKD-EPI Creatinine Equation (2021)    Anion gap 13 5 - 15    Comment: Performed at [facility], [address] [place], [place] [address]  Ethanol     Status: None   Collection Time: 01/02/23  1:15 AM  Result Value Ref Range   Alcohol, Ethyl (B) <10 <10 mg/dL    Comment: (NOTE) Lowest detectable limit for serum alcohol is 10 mg/dL.  For medical purposes only. Performed at [facility], [address] [place], [place] [address]   I-stat chem 8, ED     Status: Abnormal   Collection Time: 01/02/23  1:20 AM  Result Value Ref Range   Sodium 142 135 - 145 mmol/L   Potassium 4.0 3.5 - 5.1 mmol/L   Chloride 108 98 - 111 mmol/L   BUN 15 8 - 23 mg/dL   Creatinine, Ser 1.30 (H) 0.61 - 1.24 mg/dL   Glucose, Bld 865 (H) 70 - 99 mg/dL    Comment: Glucose reference range applies only to samples taken after fasting for at least 8 hours.   Calcium, Ion 1.12 (L) 1.15 - 1.40 mmol/L   TCO2 20 (L) 22 - 32 mmol/L   Hemoglobin 15.0 13.0 - 17.0 g/dL   HCT 78.4 69.6  - 29.5 %  CBG monitoring, ED     Status: Abnormal   Collection Time: 01/02/23  1:31 AM  Result Value Ref Range   Glucose-Capillary 126 (H) 70 - 99 mg/dL    Comment: Glucose reference range applies only to samples taken after fasting for at least 8 hours.  Urinalysis, w/ Reflex to Culture (Infection Suspected) -Urine, Clean Catch     Status: Abnormal   Collection Time: 01/02/23  7:48 AM  Result Value Ref Range   Specimen Source URINE, CLEAN CATCH    Color, Urine YELLOW YELLOW   APPearance CLEAR CLEAR   Specific Gravity, Urine 1.043 (H) 1.005 - 1.030   pH 5.0 5.0 - 8.0   Glucose, UA NEGATIVE NEGATIVE mg/dL   Hgb urine dipstick NEGATIVE NEGATIVE   Bilirubin Urine NEGATIVE NEGATIVE   Ketones, ur NEGATIVE NEGATIVE mg/dL   Protein, ur NEGATIVE NEGATIVE mg/dL   Nitrite NEGATIVE NEGATIVE   Leukocytes,Ua NEGATIVE NEGATIVE   RBC / HPF 0-5 0 - 5 RBC/hpf   WBC, UA 0-5 0 - 5 WBC/hpf    Comment:        Reflex urine culture not performed if WBC <=10, OR if Squamous epithelial cells >5. If Squamous epithelial cells >5 suggest recollection.  Bacteria, UA NONE SEEN NONE SEEN   Squamous Epithelial / HPF 0-5 0 - 5 /HPF    Comment: Performed at [facility], [address] [place], [place] [address]  Heparin level (unfractionated)     Status: Abnormal   Collection Time: 01/02/23  9:17 AM  Result Value Ref Range   Heparin Unfractionated <0.10 (L) 0.30 - 0.70 IU/mL    Comment: (NOTE) The clinical reportable range upper limit is being lowered to >1.10 to align with the FDA approved guidance for the current laboratory assay.  If heparin results are below expected values, and patient dosage has  been confirmed, suggest follow up testing of antithrombin III levels. Performed at [facility], [address] [place], [place] [address]   CBG monitoring, ED     Status: Abnormal   Collection Time: 01/02/23 12:09 PM  Result Value Ref Range   Glucose-Capillary 115 (H) 70 - 99 mg/dL     Comment: Glucose reference range applies only to samples taken after fasting for at least 8 hours.   ECHOCARDIOGRAM COMPLETE BUBBLE STUDY  Result Date: 01/02/2023    ECHOCARDIOGRAM REPORT   Patient Name:   Rodney Friedman Date of Exam: 01/02/2023 Medical Rec #:  [id number]       Height:       68.0 in Accession #:    [id number]      Weight:       210.3 lb Date of Birth:  October 27, 1945       BSA:          2.088 m Patient Age:    77 years        BP:           151/77 mmHg Patient Gender: M               HR:           85 bpm. Exam Location:  Inpatient Procedure: 2D Echo, Color Doppler, Cardiac Doppler, Intracardiac Opacification            Agent and Saline Contrast Bubble Study Indications:    Stroke  History:        Patient has prior history of Echocardiogram examinations, most                 recent 01/27/2022. CAD, Stroke, Aortic Valve Disease and s/p TAVR,                 Arrythmias:Atrial Fibrillation; Risk Factors:Diabetes,                 Hypertension, Dyslipidemia and Sleep Apnea.  Sonographer:    Milbert Coulter Referring Phys: [id number] ALLISON L ELLIS IMPRESSIONS  1. Left ventricular ejection fraction, by estimation, is 55 to 60%. The left ventricle has normal function. The left ventricle demonstrates regional wall motion abnormalities (see scoring diagram/findings for description). There is severe asymmetric left ventricular hypertrophy of the basal-septal segment. Left ventricular diastolic parameters are indeterminate.  2. Right ventricular systolic function is normal. The right ventricular size is normal.  3. Left atrial size was severely dilated.  4. Right atrial size was severely dilated.  5. The mitral valve is degenerative. Mild to moderate mitral valve regurgitation. Mild mitral stenosis. The mean mitral valve gradient is 6.0 mmHg with average heart rate of 70 bpm. Severe mitral annular calcification.  6. The aortic valve has been repaired/replaced. Aortic valve regurgitation is not visualized. Echo findings are  consistent with normal structure and function  of the aortic valve prosthesis. Aortic valve mean gradient measures 13.0 mmHg.  7. The inferior vena cava is normal in size with greater than 50% respiratory variability, suggesting right atrial pressure of 3 mmHg.  8. Evidence of atrial level shunting detected by color flow Doppler. Agitated saline contrast bubble study was negative, with no evidence of any interatrial shunt. FINDINGS  Left Ventricle: Left ventricular ejection fraction, by estimation, is 55 to 60%. The left ventricle has normal function. The left ventricle demonstrates regional wall motion abnormalities. Definity contrast agent was given IV to delineate the left ventricular endocardial borders. The left ventricular internal cavity size was normal in size. There is severe asymmetric left ventricular hypertrophy of the basal-septal segment. Left ventricular diastolic parameters are indeterminate.  LV Wall Scoring: The inferior wall is hypokinetic. Right Ventricle: The right ventricular size is normal. No increase in right ventricular wall thickness. Right ventricular systolic function is normal. Left Atrium: Left atrial size was severely dilated. Right Atrium: Right atrial size was severely dilated. Pericardium: There is no evidence of pericardial effusion. Mitral Valve: The mitral valve is degenerative in appearance. Severe mitral annular calcification. Mild to moderate mitral valve regurgitation. Mild mitral valve stenosis. MV peak gradient, 11.1 mmHg. The mean mitral valve gradient is 6.0 mmHg with average heart rate of 70 bpm. Tricuspid Valve: The tricuspid valve is normal in structure. Tricuspid valve regurgitation is not demonstrated. No evidence of tricuspid stenosis. Aortic Valve: The aortic valve has been repaired/replaced. Aortic valve regurgitation is not visualized. Aortic valve mean gradient measures 13.0 mmHg. Aortic valve peak gradient measures 24.8 mmHg. Echo findings are consistent with  normal structure and function of the aortic valve prosthesis. Pulmonic Valve: The pulmonic valve was not well visualized. Pulmonic valve regurgitation is not visualized. Aorta: The aortic root and ascending aorta are structurally normal, with no evidence of dilitation. Venous: The inferior vena cava is normal in size with greater than 50% respiratory variability, suggesting right atrial pressure of 3 mmHg. IAS/Shunts: Evidence of atrial level shunting detected by color flow Doppler. Agitated saline contrast was given intravenously to evaluate for intracardiac shunting. Agitated saline contrast bubble study was negative, with no evidence of any interatrial shunt.  LEFT VENTRICLE PLAX 2D LVIDd:         3.90 cm LVIDs:         2.90 cm LV PW:         1.50 cm LV IVS:        1.40 cm  RIGHT VENTRICLE RV S prime:     14.40 cm/s TAPSE (M-mode): 1.7 cm LEFT ATRIUM            Index        RIGHT ATRIUM           Index LA diam:      6.20 cm  2.97 cm/m   RA Area:     28.00 cm LA Vol (A4C): 132.0 ml 63.22 ml/m  RA Volume:   87.90 ml  42.10 ml/m  AORTIC VALVE AV Vmax:           249.00 cm/s AV Vmean:          165.000 cm/s AV VTI:            0.469 m AV Peak Grad:      24.8 mmHg AV Mean Grad:      13.0 mmHg LVOT Vmax:         108.00 cm/s LVOT Vmean:  68.700 cm/s LVOT VTI:          0.197 m LVOT/AV VTI ratio: 0.42  AORTA Ao Asc diam: 3.80 cm MITRAL VALVE MV Area (PHT): 2.66 cm     SHUNTS MV Peak grad:  11.1 mmHg    Systemic VTI: 0.20 m MV Mean grad:  6.0 mmHg MV Vmax:       1.66 m/s MV Vmean:      114.3 cm/s MV Decel Time: 285 msec MV E velocity: 170.00 cm/s MV A velocity: 79.70 cm/s MV E/A ratio:  2.13 Riley Lam MD Electronically signed by Riley Lam MD Signature Date/Time: 01/02/2023/12:32:59 PM    Final    MR BRAIN WO CONTRAST  Result Date: 01/02/2023 CLINICAL DATA:  Slurred speech with right-sided weakness and facial droop EXAM: MRI HEAD WITHOUT CONTRAST TECHNIQUE: Multiplanar, multiecho pulse  sequences of the brain and surrounding structures were obtained without intravenous contrast. COMPARISON:  Head CT and CTA from earlier today FINDINGS: Brain: Restricted diffusion in the left thalamus. Punctate restricted diffusion at the left occipital cortex. Mild chronic small vessel ischemia in the cerebral white matter for age. No acute hemorrhage, hydrocephalus, mass, or collection. Few chronic microhemorrhages are seen in the left cerebral hemisphere with nonspecific pattern. Mild, generalized cerebral volume loss. Vascular: Major flow voids are preserved.  There was preceding CTA. Skull and upper cervical spine: No focal marrow lesion Sinuses/Orbits: Negative IMPRESSION: Acute lacunar infarct at the left thalamus. Tiny acute left occipital cortex infarct. Electronically Signed   By: Tiburcio Pea M.D.   On: 01/02/2023 05:45   DG Chest Portable 1 View  Result Date: 01/02/2023 CLINICAL DATA:  Right leg weakness, possible stroke EXAM: PORTABLE CHEST 1 VIEW COMPARISON:  11/03/2022 CT FINDINGS: Cardiac shadow is enlarged. Changes of prior TAVR are noted. Postsurgical changes are seen. The lungs are well aerated bilaterally. No focal infiltrate is seen. No bony abnormality is noted. IMPRESSION: No acute abnormality noted. Electronically Signed   By: Alcide Clever M.D.   On: 01/02/2023 02:49   CT ANGIO HEAD NECK W WO CM (CODE STROKE)  Result Date: 01/02/2023 CLINICAL DATA:  Initial evaluation for neuro deficit, stroke. EXAM: CT ANGIOGRAPHY HEAD AND NECK WITH AND WITHOUT CONTRAST TECHNIQUE: Multidetector CT imaging of the head and neck was performed using the standard protocol during bolus administration of intravenous contrast. Multiplanar CT image reconstructions and MIPs were obtained to evaluate the vascular anatomy. Carotid stenosis measurements (when applicable) are obtained utilizing NASCET criteria, using the distal internal carotid diameter as the denominator. RADIATION DOSE REDUCTION: This exam  was performed according to the departmental dose-optimization program which includes automated exposure control, adjustment of the mA and/or kV according to patient size and/or use of iterative reconstruction technique. CONTRAST:  75mL OMNIPAQUE IOHEXOL 350 MG/ML SOLN COMPARISON:  CT from earlier the same day. FINDINGS: CTA NECK FINDINGS Aortic arch: Visualized arch within normal limits for caliber. Moderate atheromatous change about the visualized arch. Origin of the great vessels incompletely visualized. No visible stenosis. Right carotid system: Right common and internal carotid arteries are patent without dissection. Moderate calcified plaque about the right carotid bulb without hemodynamically significant greater than 50% stenosis. Left carotid system: Left common and internal carotid arteries are patent without dissection. Moderate calcified plaque about the left carotid bulb with up to approximately 60% stenosis by NASCET criteria. Vertebral arteries: Both vertebral arteries arise from the subclavian arteries. No visible significant proximal subclavian artery stenosis. Vertebral arteries are patent without significant stenosis or dissection. Skeleton:  No worrisome osseous lesions. Moderate to advanced spondylosis present at C5-6. Other neck: No other acute finding. Upper chest: No other acute finding. Review of the MIP images confirms the above findings CTA HEAD FINDINGS Anterior circulation: Atheromatous change seen about the carotid siphons with associated up to moderate stenoses bilaterally. A1 segments patent bilaterally. Normal anterior communicating complex. Anterior cerebral arteries patent without stenosis. No M1 stenosis or occlusion. No proximal ECA branch occlusion or high-grade stenosis. Distal MCA branches perfused and symmetric. Posterior circulation: Both V4 segments widely patent. Right vertebral artery slightly dominant. Both PICA patent. Basilar widely patent without stenosis. Superior  cerebellar and posterior cerebral arteries patent bilaterally. Venous sinuses: Patent allowing for timing the contrast bolus. Anatomic variants: As above.  No aneurysm. Review of the MIP images confirms the above findings IMPRESSION: 1. Negative CTA for large vessel occlusion or other emergent finding. 2. Moderate calcified plaque about the carotid bifurcations bilaterally. Resultant 60% stenosis on the left and 40% stenosis on the right. 3. Atheromatous change about the carotid siphons with up to moderate stenoses bilaterally. Aortic Atherosclerosis (ICD10-I70.0). These results were communicated to Dr. Derry Lory at 1:30 am on 01/02/2023 by text page via the [facility] messaging system. Electronically Signed   By: Rise Mu M.D.   On: 01/02/2023 01:52   CT HEAD CODE STROKE WO CONTRAST  Result Date: 01/02/2023 CLINICAL DATA:  Code stroke. Initial evaluation for neuro deficit, stroke. EXAM: CT HEAD WITHOUT CONTRAST TECHNIQUE: Contiguous axial images were obtained from the base of the skull through the vertex without intravenous contrast. RADIATION DOSE REDUCTION: This exam was performed according to the departmental dose-optimization program which includes automated exposure control, adjustment of the mA and/or kV according to patient size and/or use of iterative reconstruction technique. COMPARISON:  None Available. FINDINGS: Brain: Mild age-related cerebral atrophy. No acute intracranial hemorrhage. No acute large vessel territory infarct. No mass lesion, midline shift or mass effect. No hydrocephalus or extra-axial fluid collection. Vascular: No abnormal hyperdense vessel. Scattered calcified atherosclerosis of skull base. Skull: Scalp soft tissues and calvarium within normal limits. Sinuses/Orbits: Globes and orbital soft tissues within normal limits. Paranasal sinuses and mastoid air cells are clear. Other: None. ASPECTS [facility] Stroke Program Early CT Score) - Ganglionic level infarction (caudate,  lentiform nuclei, internal capsule, insula, M1-M3 cortex): 7 - Supraganglionic infarction (M4-M6 cortex): 3 Total score (0-10 with 10 being normal): 10 IMPRESSION: 1. No acute intracranial abnormality. 2. ASPECTS is 10. These results were communicated to Dr. Derry Lory at 1:30 am on 01/02/2023 by text page via the [facility] messaging system. Electronically Signed   By: Rise Mu M.D.   On: 01/02/2023 01:30    Pending Labs Unresulted Labs (From admission, onward)     Start     Ordered   01/03/23 0500  Lipid panel  (Labs)  Tomorrow morning,   R       Comments: Fasting    01/02/23 0700   01/03/23 0500  Heparin level (unfractionated)  Daily,   R      01/02/23 0917   01/03/23 0500  Protime-INR  Daily,   R      01/02/23 0917   01/03/23 0500  CBC  Daily,   R      01/02/23 0917            Vitals/Pain Today's Vitals   01/02/23 0515 01/02/23 0818 01/02/23 1015 01/02/23 1230  BP:   (!) 157/92 135/77  Pulse:   89 84  Resp:   16 20  Temp:  97.8 F (36.6 C)  (!) 97.5 F (36.4 C)  TempSrc:    Oral  SpO2:   100% 100%  Weight:      Height:      PainSc: 0-No pain       Isolation Precautions No active isolations  Medications Medications  sodium chloride flush (NS) 0.9 % injection 3 mL (3 mLs Intravenous Not Given 01/02/23 0149)   stroke: early stages of recovery book (has no administration in time range)  acetaminophen (TYLENOL) tablet 650 mg (has no administration in time range)    Or  acetaminophen (TYLENOL) 160 MG/5ML solution 650 mg (has no administration in time range)    Or  acetaminophen (TYLENOL) suppository 650 mg (has no administration in time range)  insulin aspart (novoLOG) injection 0-9 Units ( Subcutaneous Not Given 01/02/23 1221)  insulin aspart (novoLOG) injection 0-5 Units (has no administration in time range)  ezetimibe (ZETIA) tablet 10 mg (10 mg Oral Given 01/02/23 0955)  rosuvastatin (CRESTOR) tablet 20 mg (20 mg Oral Given 01/02/23 0955)  buPROPion ER  (WELLBUTRIN SR) 12 hr tablet 200 mg (200 mg Oral Given 01/02/23 0955)  gabapentin (NEURONTIN) capsule 300 mg (has no administration in time range)  albuterol (PROVENTIL) (2.5 MG/3ML) 0.083% nebulizer solution 2.5 mg (has no administration in time range)  buPROPion ER (WELLBUTRIN SR) 12 hr tablet 100 mg (has no administration in time range)  metoprolol tartrate (LOPRESSOR) injection 5 mg (has no administration in time range)  Warfarin - Pharmacist Dosing Inpatient (has no administration in time range)  heparin ADULT infusion 100 units/mL (25000 units/231mL) (1,150 Units/hr Intravenous New Bag/Given 01/02/23 1223)  perflutren lipid microspheres (DEFINITY) IV suspension (2 mLs Intravenous Given 01/02/23 1202)  iohexol (OMNIPAQUE) 350 MG/ML injection 75 mL (75 mLs Intravenous Contrast Given 01/02/23 0125)    Mobility walks with device     Focused Assessments    R Recommendations: See Admitting Provider Note  Report given to:   Additional Notes:

## 2023-01-02 NOTE — Evaluation (Signed)
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   Chronic pain 03/14/2019   Essential hypertension 07/27/2018   Diabetes mellitus type 2 in obese 07/27/2018   Paroxysmal atrial fibrillation (HCC) 04/21/2016   Diabetes mellitus (HCC) 04/21/2016   OSA (obstructive sleep apnea) 04/21/2016   Quality of life palliative care encounter 01/03/2016   Tremor, essential 09/22/2015   Glaucoma, open angle 06/16/2015   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   Pseudoaphakia 06/15/2015   CD (contact dermatitis) 04/29/2014   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   Hematuria 01/05/2014   Skin lesion 01/05/2014   Severe aortic stenosis 01/03/2014   Hypertensive heart disease 01/03/2014   Mixed hyperlipidemia 01/03/2014   Mild obesity 01/03/2014   Chronic atrial fibrillation (HCC) 12/31/2013   Long term (current) use of anticoagulants 12/31/2013   Cataract, nuclear 10/06/2013   Dermatochalasis of eyelid 10/06/2013   Chorioretinal scar, 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/03/2013   Pre-diabetes 09/03/2013   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   Prostate cancer (HCC) 07/08/2013   Past Medical History:  Past Medical History:  Diagnosis Date   Adenomatous colon polyp    Alcoholism (HCC)    Anxiety    Atrial fibrillation (HCC)    CAD (coronary artery disease)    Cataract    removed left eye    CHF (congestive heart failure) (HCC) 12/16/2020   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 Clifton James & Dr. 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   Past Surgical History:  Past Surgical History:  Procedure Laterality Date   Arm Surgery Right    from door accident with glass   CARDIAC CATHETERIZATION     CATARACT EXTRACTION W/ INTRAOCULAR LENS IMPLANT Bilateral    COLONOSCOPY     CORONARY ARTERY BYPASS GRAFT  08/04/2004   EYE SURGERY     POLYPECTOMY     RIGHT/LEFT HEART CATH AND CORONARY/GRAFT ANGIOGRAPHY N/A 01/20/2021   Procedure: RIGHT/LEFT HEART CATH AND CORONARY/GRAFT ANGIOGRAPHY;  Surgeon: Kathleene Hazel, MD;  Location: [facility] INVASIVE CV LAB;  Service: Cardiovascular;  Laterality: N/A;   TEE WITHOUT  CARDIOVERSION N/A 02/08/2021   Procedure: TRANSESOPHAGEAL ECHOCARDIOGRAM (TEE);  Surgeon: Kathleene Hazel, MD;  Location: [facility] INVASIVE CV LAB;  Service: Open Heart Surgery;  Laterality: N/A;   TRANSCATHETER AORTIC VALVE REPLACEMENT, CAROTID Left 02/08/2021   Procedure: TRANSCATHETER AORTIC VALVE REPLACEMENT, LEFT CAROTID;  Surgeon: Kathleene Hazel, MD;  Location: [facility] INVASIVE CV LAB;  Service: Open Heart Surgery;  Laterality: Left;   HPI:  [name] is a 77 y.o. male who was  admitted with stroke like symptoms. MRI 8/13: "Acute lacunar infarct at the left thalamus. Tiny acute left  occipital cortex infarct."  Pt with medical history significant of CLL, prostate cancer, CAD, diabetes mellitus on metformin, chronic atrial fibrillation on warfarin, history of severe aortic stenosis status post TAVR, dyslipidemia and sleep apnea.   Assessment / Plan / Recommendation Clinical Impression  Pt presents with functional cognitive linguisitic abilities.  Pt was assessed using the COGNISTAT (see below for addtional information).  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  Pt  demonstrated mild difficulty with word recall tasks.  He reports baseline short term memory impairment, but excellent long term memory. Hel lives alone and does not have any support people near by at this time.  He is able to describe systems in place for medication management (including referring to [facility] with any questions), he typically does not travel far from home and utilizes GPS navication for unfamiliar areas, he does not drive 2/2 visual impairments.    Pt reports significant dysarthria yesterday which has largely resolved.  Pt does report that he is speaking more slowly, but speech is 100% intelligible.  Pt's symptoms seemingly c/w resolving [facility] dysarthria.  Reviewed speech compensatory strategies with pt verbally and with written handout.  If symptoms do not resolve or are bothersome, recommend OP ST.  Pt does not have any acute ST needs.  COGNISTAT: All subtests are within the average range, except where otherwise specified.  Orientation:  12/12 Attention: 8/8 Comprehension: 6/6 Repetition: 10/12, borderline, suspected to be 2/2 hearing corrected on repetition Naming: 8/8 Construction: clock drawing, drew his own time before getting instructions, but accurately read back time Memory: 7/12, Mild-mod impariment Calculations: 4/4 Similarities:  7/8 Judgment: 5/6     SLP Assessment  SLP Recommendation/Assessment: All further Speech Lanaguage Pathology  needs can be addressed in the next venue of care SLP Visit Diagnosis: Dysarthria and anarthria (R47.1);Cognitive communication deficit (R41.841)    Recommendations for follow up therapy are one component of a multi-disciplinary discharge planning process, led by the attending physician.  Recommendations may be updated based on patient status, additional functional criteria and insurance authorization.    Follow Up Recommendations  Outpatient SLP (if deficits persist)    Assistance Recommended at Discharge  None  Functional Status Assessment Patient has had a recent decline in their functional status and demonstrates the ability to make significant improvements in function in a reasonable and predictable amount of time.  Frequency and Duration   N/A        SLP Evaluation Cognition  Overall Cognitive Status: Within Functional Limits for tasks assessed Arousal/Alertness: Awake/alert Orientation Level: Oriented X4 Year: 2024 Month: August Day of Week: Correct Attention: Focused;Sustained Focused Attention: Appears intact Sustained Attention: Appears intact Memory: Impaired Memory Impairment: Decreased short term memory Awareness: Appears intact Problem Solving: Appears intact Executive Function: Reasoning Reasoning: Appears intact Safety/Judgment: Appears intact       Comprehension  Auditory Comprehension Overall Auditory Comprehension: Appears within functional  limits for tasks assessed Commands: Within Functional Limits Conversation: Complex Visual Recognition/Discrimination Discrimination: Not tested Reading Comprehension Reading Status: Not tested    Expression Expression Primary Mode of Expression: Verbal Verbal Expression Overall Verbal Expression: Appears within functional limits for tasks assessed Level of Generative/Spontaneous Verbalization:  Conversation Repetition: No impairment Naming: No impairment Pragmatics: No impairment Written Expression Dominant Hand: Right Written Expression: Not tested   Oral / Motor  Motor Speech Overall Motor Speech: Appears within functional limits for tasks assessed Respiration: Within functional limits Resonance: Within functional limits Articulation: Within functional limitis Intelligibility: Intelligible Motor Planning: Witnin functional limits Motor Speech Errors: Not applicable Effective Techniques: Slow rate            Kerrie Pleasure, MA, CCC-SLP Acute Rehabilitation Services Office: [phone] 01/02/2023, 12:01 PM

## 2023-01-02 NOTE — Progress Notes (Addendum)
STROKE TEAM PROGRESS NOTE   BRIEF HPI Rodney Friedman is a 77 y.o. 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 Went to bed at 8pm. He got up at 10pm to use the bathroom and R leg was weak and he almost fell. Helped himself to the ground. Called his friend and then later called EMS. Outside window for TNK and no LVO for thrombectomy.    SIGNIFICANT HOSPITAL EVENTS 8/13 Presented to ED  INTERIM HISTORY/SUBJECTIVE Patient reports that he is continuing to have weakness but some improvement. He reports that he has taken warfarin since 2006 and reports that he has not been trailed on a DOAC such as eliquis. He reports that he believes his INR has been labile due to dietary leafy green consumption being decreased. He reports that he has medicare with supplemental plan, and we discussed with him that since he has had major cardiovascular events including his second stroke now that he would likely qualify for eliquis paid for by insurance. He is open to trying eliquis but insists that he wants his cardiologist involved; we assured him that his cardiologist would be happy with this decision as DOACs are the standard of care for A-fib anticoagulation.   Spoke to pt's cardiology, Dr. Royann Shivers, who stated patient was on warfarin for cost issues. Says he can switch him to eliquis. Also made the point that pt has [employer] benefits.    OBJECTIVE  CBC    Component Value Date/Time   WBC 14.2 (H) 01/02/2023 0115   RBC 4.94 01/02/2023 0115   HGB 15.0 01/02/2023 0120   HGB 15.5 11/27/2022 0839   HGB 15.8 01/01/2017 0752   HCT 44.0 01/02/2023 0120   HCT 47.4 11/27/2022 0839   HCT 46.4 01/01/2017 0752   PLT 175 01/02/2023 0115   PLT 177 11/27/2022 0839   MCV 89.3 01/02/2023 0115   MCV 94 11/27/2022 0839   MCV 91.8 01/01/2017 0752   MCH 30.2 01/02/2023 0115   MCHC 33.8 01/02/2023 0115    RDW 13.3 01/02/2023 0115   RDW 13.3 11/27/2022 0839   RDW 13.5 01/01/2017 0752   LYMPHSABS 4.7 (H) 01/02/2023 0115   LYMPHSABS 9.5 (H) 04/30/2019 1103   LYMPHSABS 9.7 (H) 01/01/2017 0752   MONOABS 1.4 (H) 01/02/2023 0115   MONOABS 1.5 (H) 01/01/2017 0752   EOSABS 0.7 (H) 01/02/2023 0115   EOSABS 0.2 04/30/2019 1103   BASOSABS 0.3 (H) 01/02/2023 0115   BASOSABS 0.1 04/30/2019 1103   BASOSABS 0.1 01/01/2017 0752    BMET    Component Value Date/Time   NA 142 01/02/2023 0120   NA 143 11/27/2022 0839   NA 141 01/04/2015 0808   K 4.0 01/02/2023 0120   K 4.3 01/04/2015 0808   CL 108 01/02/2023 0120   CO2 19 (L) 01/02/2023 0115   CO2 21 (L) 01/04/2015 0808   GLUCOSE 122 (H) 01/02/2023 0120   GLUCOSE 115 01/04/2015 0808   BUN 15 01/02/2023 0120   BUN 15 11/27/2022 0839   BUN 18.7 01/04/2015 0808   CREATININE 1.30 (H) 01/02/2023 0120   CREATININE 1.16 03/10/2016 0830   CREATININE 0.9 01/04/2015 0808   CALCIUM 9.4 01/02/2023 0115   CALCIUM 9.0 01/04/2015 0808   EGFR 56 (L) 11/27/2022 0839   GFRNONAA 54 (L) 01/02/2023 0115   GFRNONAA 70 02/11/2016 1526    IMAGING past 24 hours ECHOCARDIOGRAM COMPLETE BUBBLE STUDY  Result Date: 01/02/2023    ECHOCARDIOGRAM REPORT   Patient Name:   Rodney Friedman Date of Exam: 01/02/2023 Medical Rec #:  [id number]       Height:       68.0 in Accession #:    [id number]      Weight:       210.3 lb Date of Birth:  1946/02/18       BSA:          2.088 m Patient Age:    77 years        BP:           151/77 mmHg Patient Gender: M               HR:           85 bpm. Exam Location:  Inpatient Procedure: 2D Echo, Color Doppler, Cardiac Doppler, Intracardiac Opacification            Agent and Saline Contrast Bubble Study Indications:    Stroke  History:        Patient has prior history of Echocardiogram examinations, most                 recent 01/27/2022. CAD, Stroke, Aortic Valve Disease and s/p TAVR,                 Arrythmias:Atrial Fibrillation; Risk  Factors:Diabetes,                 Hypertension, Dyslipidemia and Sleep Apnea.  Sonographer:    Milbert Coulter Referring Phys: [id number] ALLISON L ELLIS IMPRESSIONS  1. Left ventricular ejection fraction, by estimation, is 55 to 60%. The left ventricle has normal function. The left ventricle demonstrates regional wall motion abnormalities (see scoring diagram/findings for description). There is severe asymmetric left ventricular hypertrophy of the basal-septal segment. Left ventricular diastolic parameters are indeterminate.  2. Right ventricular systolic function is normal. The right ventricular size is normal.  3. Left atrial size was severely dilated.  4. Right atrial size was severely dilated.  5. The mitral valve is degenerative. Mild to moderate mitral valve regurgitation. Mild mitral stenosis. The mean mitral valve gradient is 6.0 mmHg with average heart rate of 70 bpm. Severe mitral annular calcification.  6. The aortic valve has been repaired/replaced. Aortic valve regurgitation is not visualized. Echo findings are consistent with normal structure and function of the aortic valve prosthesis. Aortic valve mean gradient measures 13.0 mmHg.  7. The inferior vena cava is normal in size with greater than 50% respiratory variability, suggesting right atrial pressure of 3 mmHg.  8. Evidence of atrial level shunting detected by color flow Doppler. Agitated saline contrast bubble study was negative, with no evidence of any interatrial shunt. FINDINGS  Left Ventricle: Left ventricular ejection fraction, by estimation, is 55 to 60%. The left ventricle has normal function. The left ventricle demonstrates regional wall motion abnormalities. Definity contrast agent was given IV to delineate the left ventricular endocardial borders. The left ventricular internal cavity size was normal in size. There is severe asymmetric left ventricular hypertrophy of the basal-septal segment. Left ventricular diastolic parameters are  indeterminate.  LV Wall Scoring: The inferior wall is hypokinetic. Right Ventricle: The right ventricular size is normal. No increase in right ventricular wall thickness. Right ventricular systolic function is normal. Left Atrium: Left atrial size was severely dilated. Right Atrium: Right atrial size was severely dilated. Pericardium: There is no evidence of pericardial effusion. Mitral Valve: The mitral valve is  degenerative in appearance. Severe mitral annular calcification. Mild to moderate mitral valve regurgitation. Mild mitral valve stenosis. MV peak gradient, 11.1 mmHg. The mean mitral valve gradient is 6.0 mmHg with average heart rate of 70 bpm. Tricuspid Valve: The tricuspid valve is normal in structure. Tricuspid valve regurgitation is not demonstrated. No evidence of tricuspid stenosis. Aortic Valve: The aortic valve has been repaired/replaced. Aortic valve regurgitation is not visualized. Aortic valve mean gradient measures 13.0 mmHg. Aortic valve peak gradient measures 24.8 mmHg. Echo findings are consistent with normal structure and function of the aortic valve prosthesis. Pulmonic Valve: The pulmonic valve was not well visualized. Pulmonic valve regurgitation is not visualized. Aorta: The aortic root and ascending aorta are structurally normal, with no evidence of dilitation. Venous: The inferior vena cava is normal in size with greater than 50% respiratory variability, suggesting right atrial pressure of 3 mmHg. IAS/Shunts: Evidence of atrial level shunting detected by color flow Doppler. Agitated saline contrast was given intravenously to evaluate for intracardiac shunting. Agitated saline contrast bubble study was negative, with no evidence of any interatrial shunt.  LEFT VENTRICLE PLAX 2D LVIDd:         3.90 cm LVIDs:         2.90 cm LV PW:         1.50 cm LV IVS:        1.40 cm  RIGHT VENTRICLE RV S prime:     14.40 cm/s TAPSE (M-mode): 1.7 cm LEFT ATRIUM            Index        RIGHT ATRIUM            Index LA diam:      6.20 cm  2.97 cm/m   RA Area:     28.00 cm LA Vol (A4C): 132.0 ml 63.22 ml/m  RA Volume:   87.90 ml  42.10 ml/m  AORTIC VALVE AV Vmax:           249.00 cm/s AV Vmean:          165.000 cm/s AV VTI:            0.469 m AV Peak Grad:      24.8 mmHg AV Mean Grad:      13.0 mmHg LVOT Vmax:         108.00 cm/s LVOT Vmean:        68.700 cm/s LVOT VTI:          0.197 m LVOT/AV VTI ratio: 0.42  AORTA Ao Asc diam: 3.80 cm MITRAL VALVE MV Area (PHT): 2.66 cm     SHUNTS MV Peak grad:  11.1 mmHg    Systemic VTI: 0.20 m MV Mean grad:  6.0 mmHg MV Vmax:       1.66 m/s MV Vmean:      114.3 cm/s MV Decel Time: 285 msec MV E velocity: 170.00 cm/s MV A velocity: 79.70 cm/s MV E/A ratio:  2.13 Riley Lam MD Electronically signed by Riley Lam MD Signature Date/Time: 01/02/2023/12:32:59 PM    Final    MR BRAIN WO CONTRAST  Result Date: 01/02/2023 CLINICAL DATA:  Slurred speech with right-sided weakness and facial droop EXAM: MRI HEAD WITHOUT CONTRAST TECHNIQUE: Multiplanar, multiecho pulse sequences of the brain and surrounding structures were obtained without intravenous contrast. COMPARISON:  Head CT and CTA from earlier today FINDINGS: Brain: Restricted diffusion in the left thalamus. Punctate restricted diffusion at the left occipital cortex. Mild chronic small vessel ischemia in  the cerebral white matter for age. No acute hemorrhage, hydrocephalus, mass, or collection. Few chronic microhemorrhages are seen in the left cerebral hemisphere with nonspecific pattern. Mild, generalized cerebral volume loss. Vascular: Major flow voids are preserved.  There was preceding CTA. Skull and upper cervical spine: No focal marrow lesion Sinuses/Orbits: Negative IMPRESSION: Acute lacunar infarct at the left thalamus. Tiny acute left occipital cortex infarct. Electronically Signed   By: Tiburcio Pea M.D.   On: 01/02/2023 05:45   DG Chest Portable 1 View  Result Date: 01/02/2023 CLINICAL  DATA:  Right leg weakness, possible stroke EXAM: PORTABLE CHEST 1 VIEW COMPARISON:  11/03/2022 CT FINDINGS: Cardiac shadow is enlarged. Changes of prior TAVR are noted. Postsurgical changes are seen. The lungs are well aerated bilaterally. No focal infiltrate is seen. No bony abnormality is noted. IMPRESSION: No acute abnormality noted. Electronically Signed   By: Alcide Clever M.D.   On: 01/02/2023 02:49   CT ANGIO HEAD NECK W WO CM (CODE STROKE)  Result Date: 01/02/2023 CLINICAL DATA:  Initial evaluation for neuro deficit, stroke. EXAM: CT ANGIOGRAPHY HEAD AND NECK WITH AND WITHOUT CONTRAST TECHNIQUE: Multidetector CT imaging of the head and neck was performed using the standard protocol during bolus administration of intravenous contrast. Multiplanar CT image reconstructions and MIPs were obtained to evaluate the vascular anatomy. Carotid stenosis measurements (when applicable) are obtained utilizing NASCET criteria, using the distal internal carotid diameter as the denominator. RADIATION DOSE REDUCTION: This exam was performed according to the departmental dose-optimization program which includes automated exposure control, adjustment of the mA and/or kV according to patient size and/or use of iterative reconstruction technique. CONTRAST:  75mL OMNIPAQUE IOHEXOL 350 MG/ML SOLN COMPARISON:  CT from earlier the same day. FINDINGS: CTA NECK FINDINGS Aortic arch: Visualized arch within normal limits for caliber. Moderate atheromatous change about the visualized arch. Origin of the great vessels incompletely visualized. No visible stenosis. Right carotid system: Right common and internal carotid arteries are patent without dissection. Moderate calcified plaque about the right carotid bulb without hemodynamically significant greater than 50% stenosis. Left carotid system: Left common and internal carotid arteries are patent without dissection. Moderate calcified plaque about the left carotid bulb with up to  approximately 60% stenosis by NASCET criteria. Vertebral arteries: Both vertebral arteries arise from the subclavian arteries. No visible significant proximal subclavian artery stenosis. Vertebral arteries are patent without significant stenosis or dissection. Skeleton: No worrisome osseous lesions. Moderate to advanced spondylosis present at C5-6. Other neck: No other acute finding. Upper chest: No other acute finding. Review of the MIP images confirms the above findings CTA HEAD FINDINGS Anterior circulation: Atheromatous change seen about the carotid siphons with associated up to moderate stenoses bilaterally. A1 segments patent bilaterally. Normal anterior communicating complex. Anterior cerebral arteries patent without stenosis. No M1 stenosis or occlusion. No proximal ECA branch occlusion or high-grade stenosis. Distal MCA branches perfused and symmetric. Posterior circulation: Both V4 segments widely patent. Right vertebral artery slightly dominant. Both PICA patent. Basilar widely patent without stenosis. Superior cerebellar and posterior cerebral arteries patent bilaterally. Venous sinuses: Patent allowing for timing the contrast bolus. Anatomic variants: As above.  No aneurysm. Review of the MIP images confirms the above findings IMPRESSION: 1. Negative CTA for large vessel occlusion or other emergent finding. 2. Moderate calcified plaque about the carotid bifurcations bilaterally. Resultant 60% stenosis on the left and 40% stenosis on the right. 3. Atheromatous change about the carotid siphons with up to moderate stenoses bilaterally. Aortic Atherosclerosis (ICD10-I70.0). These  results were communicated to Dr. Derry Lory at 1:30 am on 01/02/2023 by text page via the [facility] messaging system. Electronically Signed   By: Rise Mu M.D.   On: 01/02/2023 01:52   CT HEAD CODE STROKE WO CONTRAST  Result Date: 01/02/2023 CLINICAL DATA:  Code stroke. Initial evaluation for neuro deficit, stroke. EXAM:  CT HEAD WITHOUT CONTRAST TECHNIQUE: Contiguous axial images were obtained from the base of the skull through the vertex without intravenous contrast. RADIATION DOSE REDUCTION: This exam was performed according to the departmental dose-optimization program which includes automated exposure control, adjustment of the mA and/or kV according to patient size and/or use of iterative reconstruction technique. COMPARISON:  None Available. FINDINGS: Brain: Mild age-related cerebral atrophy. No acute intracranial hemorrhage. No acute large vessel territory infarct. No mass lesion, midline shift or mass effect. No hydrocephalus or extra-axial fluid collection. Vascular: No abnormal hyperdense vessel. Scattered calcified atherosclerosis of skull base. Skull: Scalp soft tissues and calvarium within normal limits. Sinuses/Orbits: Globes and orbital soft tissues within normal limits. Paranasal sinuses and mastoid air cells are clear. Other: None. ASPECTS [facility] Stroke Program Early CT Score) - Ganglionic level infarction (caudate, lentiform nuclei, internal capsule, insula, M1-M3 cortex): 7 - Supraganglionic infarction (M4-M6 cortex): 3 Total score (0-10 with 10 being normal): 10 IMPRESSION: 1. No acute intracranial abnormality. 2. ASPECTS is 10. These results were communicated to Dr. Derry Lory at 1:30 am on 01/02/2023 by text page via the [facility] messaging system. Electronically Signed   By: Rise Mu M.D.   On: 01/02/2023 01:30    Vitals:   01/02/23 0818 01/02/23 1015 01/02/23 1230 01/02/23 1600  BP:  (!) 157/92 135/77 127/71  Pulse:  89 84 89  Resp:  16 20 20   Temp: 97.8 F (36.6 C)  (!) 97.5 F (36.4 C) 97.9 F (36.6 C)  TempSrc:   Oral Oral  SpO2:  100% 100% 98%  Weight:      Height:         PHYSICAL EXAM General:  Alert, well-nourished, well-developed elder Caucasian male in no acute distress Psych:  Mood and affect appropriate for situation CV: Regular rate and rhythm on monitor Respiratory:   Regular, unlabored respirations on room air GI: Abdomen soft and nontender   NEURO:  Mental Status: AA&Ox3, patient is able to give clear and coherent history Speech/Language: speech is without dysarthria or aphasia.  Naming, repetition, fluency, and comprehension intact.  Cranial Nerves:  II: PERRL. Visual fields full.  III, IV, VI: EOMI. Eyelids elevate symmetrically.  V: Sensation is intact to light touch and symmetrical to face.  VII: Face is symmetrical resting and smiling VIII: hearing intact to voice. IX, X: Palate elevates symmetrically. Phonation is normal.  ZO:[id number] shrug 5/5. XII: tongue is midline without fasciculations. Motor: 5/5 strength to all muscle groups tested.  Diminished fine finger movements on the right orbits left over right upper extremity.  Mild right grip weakness. Tone: is normal and bulk is normal Sensation- Intact to light touch bilaterally. Extinction absent to light touch to DSS.   Coordination: FTN intact bilaterally, HKS: no ataxia in BLE.No drift.  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 ASPECTS 10.    CTA head & neck no LVO. Mod caroid stenosis, L 60%, R 40%.  MRI  acute lacunar infarct at the left thalamus. Tiny acute left occipital cortex infarct  2D Echo 50-55%. Aortic dilation. S/p TAVR.  LDL 42 HgbA1c 6.9 VTE prophylaxis - on anticoagulation warfarin daily prior to admission, now on Eliquis (apixaban) daily. Therapy recommendations:  CIR Disposition:  pending  Atrial fibrillation Home Meds: warfarin Continue telemetry monitoring Switch from warfarin to Eliquis 5 mg twice daily, cost issues in past but will try prescribing to [facility] pharmacy. Labile INRs recently.   Hypertension Home meds:  metoprolol XR 50 mg once daily Stable Blood Pressure Goal: BP less than 220/110  permissive HTN  Hyperlipidemia Home meds:  rosuvastatin 20 mg once dialy, resumed in hospital LDL 42, goal < 70 Continue statin at discharge  Diabetes type II Controlled Home meds:  metformin 500 mg twice daily HgbA1c 6.9, goal < 7.0 Recommend close follow-up with PCP for better DM control  Dysphagia Patient has post-stroke dysphagia, SLP consulted    Diet   Diet Carb Modified Fluid consistency: Thin; Room service appropriate? Yes   Advance diet as tolerated  Other Stroke Risk Factors Obesity, Body mass index is 31.98 kg/m., BMI >/= 30 associated with increased stroke risk, recommend weight loss, diet and exercise as appropriate  Coronary artery disease Congestive heart failure   Other Active Problems Elevated Cr, 1.37 potentially above baseline (1.1-1.2?)  Hospital day # 0  STROKE MD NOTE :  I have personally obtained history,examined this patient, reviewed notes, independently viewed imaging studies, participated in medical decision making and plan of care.ROS completed by me personally and pertinent positives fully documented  I have made any additions or clarifications directly to the above note. Agree with note above.  Patient presented with right-sided weakness due to left thalamic lacunar infarct likely from small vessel disease but he also has A-fib and is on warfarin and has difficulty regulating his INR.Marland Kitchen  Recommend switch from warfarin to Eliquis for stroke prevention if patient can afford it for his A-fib and aggressive risk factor modification.  Mobilize out of bed.  Therapy consults.  Discussed with Dr. Royann Shivers patient's cardiologist who agrees with plan.  Greater than 50% time during this 50-minute visit was spent on counseling and coordination of care about his atrial fibrillation and stroke and discussion about plan for changing anticoagulation from warfarin to Eliquis and answering questions.  Stroke team will sign off.  Kindly call for questions.  Follow-up as  outpatient stroke clinic in 2 months  Delia Heady, MD Medical Director [facility] Pager: [phone] 01/02/2023 5:44 PM   To contact Stroke Continuity provider, please refer to [email or web address]. After hours, contact General Neurology

## 2023-01-02 NOTE — Evaluation (Signed)
Occupational Therapy Evaluation Patient Details Name: Rodney Friedman MRN: [id number] DOB: 12-10-1945 Today's Date: 01/02/2023   History of Present Illness Pt is 77 yo male admitted on 01/02/23 with CVA - acute lacunar infarct of L thalamus and tiny acute left occipital cortex infarct.  Pt with hx including but not limited to CLL, prostate cancer, CAD, diabetes mellitus on metformin, chronic atrial fibrillation on warfarin, history of severe aortic stenosis status post TAVR, dyslipidemia and sleep apnea.   Clinical Impression   Pt currently at min assist overall for functional transfers without use of an assistive device as well as min assist for selfcare sit to stand.  Increased dysmetria and ataxia noted in the RUE and RLE with increased fall risk.  Pt lives alone and does not have any consistent assist from family or friends.  Feel he will benefit from acute care OT to help progress toward modified independent level for return home at a safe level of function.  Recommend patient will benefit from intensive inpatient follow up therapy, >3 hours/day post acute stay.          If plan is discharge home, recommend the following: A little help with walking and/or transfers;Assistance with cooking/housework;A little help with bathing/dressing/bathroom;Assist for transportation    Functional Status Assessment  Patient has had a recent decline in their functional status and demonstrates the ability to make significant improvements in function in a reasonable and predictable amount of time.  Equipment Recommendations  None recommended by OT    Recommendations for Other Services Rehab consult     Precautions / Restrictions Precautions Precautions: Fall Precaution Comments: RUE and LE ataxia Restrictions Weight Bearing Restrictions: No      Mobility Bed Mobility Overal bed mobility: Needs Assistance Bed Mobility: Supine to Sit, Sit to Supine     Supine to sit: Contact guard, HOB  elevated Sit to supine: Contact guard assist        Transfers Overall transfer level: Needs assistance Equipment used: None Transfers: Sit to/from Stand, Bed to chair/wheelchair/BSC Sit to Stand: Min assist     Step pivot transfers: Min assist     General transfer comment: Min hand held assist needed for ambulation without assistive device, min guard with use of the RW for support with mod instructional cueing for hand placement during sit to stand and stand to sit as well as for staying inside of the RW.      Balance Overall balance assessment: Needs assistance Sitting-balance support: No upper extremity supported Sitting balance-Leahy Scale: Good     Standing balance support: Bilateral upper extremity supported, Reliant on assistive device for balance Standing balance-Leahy Scale: Poor Standing balance comment: UE support needed for balance                           ADL either performed or assessed with clinical judgement   ADL Overall ADL's : Needs assistance/impaired Eating/Feeding: Set up;Sitting   Grooming: Wash/dry hands;Wash/dry face;Contact guard assist;Standing   Upper Body Bathing: Set up;Sitting Upper Body Bathing Details (indicate cue type and reason): simulated Lower Body Bathing: Minimal assistance;Sit to/from stand   Upper Body Dressing : Minimal assistance;Sitting Upper Body Dressing Details (indicate cue type and reason): assist for tying gown in the front Lower Body Dressing: Minimal assistance;Sit to/from stand   Toilet Transfer: Minimal assistance;Ambulation Toilet Transfer Details (indicate cue type and reason): min assist without use of an assistive device, min guard with use Toileting- Clothing Manipulation and  Hygiene: Minimal assistance;Sit to/from stand       Functional mobility during ADLs: Minimal assistance (hand held assist on the right and pt holding IV pole on the left) General ADL Comments: Pt with increased ataxia noted  with RUE use and in the RLE with standing and mobility.  Needs UE support during standing and mobility for safety.  Pt lives alone and was driving as needed to appointments.  Son lives in the area but is not able to assist consistently.     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, wears glasses for near vision.) Ability to See in Adequate Light: 0 Adequate Patient Visual Report: No change from baseline Vision Assessment?: Yes Eye Alignment: Within Functional Limits Ocular Range of Motion: Within Functional Limits Alignment/Gaze Preference: Within Defined Limits Tracking/Visual Pursuits: Able to track stimulus in all quads without difficulty Convergence: Impaired (comment) (slight delay in adduction of the right eye) Visual Fields: No apparent deficits (Will continue to assess in function)     Perception Perception: Within Functional Limits       Praxis Praxis: [facility]       Pertinent Vitals/Pain Pain Assessment Pain Assessment: No/denies pain     Extremity/Trunk Assessment Upper Extremity Assessment Upper Extremity Assessment: RUE deficits/detail RUE Deficits / Details: Strength 4/5 throughout.  Pt with increased dysmetria with finger to nose, unable to tie his gown in the front secondary to ataxia and decreased FM coordination. RUE Sensation: WNL RUE Coordination: decreased fine motor;decreased gross motor LUE Deficits / Details: LUE strength WFLs, slight tremor noted which patient reports that he has a history of. LUE Coordination: decreased gross motor   Lower Extremity Assessment Lower Extremity Assessment: Defer to PT evaluation RLE Deficits / Details: ROM WFL; MMT 5/5 ankle, 5/5 knee ext, 5/5 hip flex RLE Sensation: WNL RLE Coordination: decreased gross motor (slight decrease with heel/shin, and mild ataxia) LLE Deficits / Details: ROM WFL; MMT 5/5 LLE Sensation: WNL   Cervical / Trunk Assessment Cervical / Trunk Assessment:  Normal   Communication Communication Communication: No apparent difficulties Cueing Techniques: Verbal cues   Cognition Arousal: Alert Behavior During Therapy: WFL for tasks assessed/performed Overall Cognitive Status: Within Functional Limits for tasks assessed                                 General Comments: Cognitively intact but exhibits some decreased anticipatory awareness with concerns of needing to get back home as soon as possible to pay bills, go to his appointments so that he doesn't lose insurance, house, ect.     General Comments  Pt denies any changes in his vision. Educated pt on PT recommendations for post-acute rehab due to well below baseline, would benefit from intense therapy, good rehab potential, fall risk, and limited support at home. Pt politely reports "that just won't work, I have to go home." Expressed need to take care of daily task at home, pay bills on his computer (not laptop), attend to all of his other appointments (explained MD offices could reschedule), etc. He was open to HHPT. Discussed would still recommend rehab for his recovery and safety but decision is his. Did discuss if he decides to go home discussed safety recommendations including assist for stairs, could use transport chair to sit while in kitchen prepping food or other task with standing, using shower chair, keeping phone close, not driving.  Home Living Family/patient expects to be discharged to:: Private residence Living Arrangements: Alone Available Help at Discharge: Family;Available PRN/intermittently;Friend(s) Type of Home: House Home Access: Stairs to enter [employer] of Steps: 4 Entrance Stairs-Rails: Right;Left Home Layout: One level     Bathroom Shower/Tub: Walk-in shower;Tub/shower unit   Bathroom Toilet: Handicapped height Bathroom Accessibility: Yes   Home Equipment: Grab bars - tub/shower;Shower Counsellor (2 wheels);Cane -  single point;Transport chair   Additional Comments: Pt reports son is single father and not able to assist much.  States he has a couple friends from AA could help occasionally.  Lives With: Alone    Prior Functioning/Environment Prior Level of Function : Independent/Modified Independent;Driving             Mobility Comments: could ambulate iin community without AD ADLs Comments: independent with adls and iadls        OT Problem List: Impaired balance (sitting and/or standing);Impaired UE functional use;Decreased coordination;Decreased knowledge of use of DME or AE      OT Treatment/Interventions: Patient/family education;Balance training;Neuromuscular education;Therapeutic exercise;Self-care/ADL training;Therapeutic activities;DME and/or AE instruction    OT Goals(Current goals can be found in the care plan section) Acute Rehab OT Goals Patient Stated Goal: Pt wants to go home as soon as possible but lives alone.  He wants to get his strength back. OT Goal Formulation: With patient Time For Goal Achievement: 01/16/23 Potential to Achieve Goals: Good  OT Frequency: Min 1X/week       AM-PAC OT "6 Clicks" Daily Activity     Outcome Measure Help from another person eating meals?: A Little Help from another person taking care of personal grooming?: A Little Help from another person toileting, which includes using toliet, bedpan, or urinal?: A Little Help from another person bathing (including washing, rinsing, drying)?: A Little Help from another person to put on and taking off regular upper body clothing?: A Little Help from another person to put on and taking off regular lower body clothing?: A Little 6 Click Score: 18   End of Session Equipment Utilized During Treatment: Gait belt;Rolling walker (2 wheels) Nurse Communication: Mobility status  Activity Tolerance: Patient tolerated treatment well Patient left: in bed;with call bell/phone within reach  OT Visit Diagnosis:  Unsteadiness on feet (R26.81);Muscle weakness (generalized) (M62.81);Repeated falls (R29.6);Other abnormalities of gait and mobility (R26.89);Ataxia, unspecified (R27.0);Hemiplegia and hemiparesis Hemiplegia - Right/Left: Right Hemiplegia - caused by: Cerebral infarction                Time: 1351-1452 OT Time Calculation (min): 61 min Charges:  OT General Charges $OT Visit: 1 Visit OT Evaluation $OT Eval Moderate Complexity: 1 Mod OT Treatments $Self Care/Home Management : 23-37 mins Perrin Maltese, OTR/L Acute Rehabilitation Services  Office [phone] 01/02/2023

## 2023-01-03 ENCOUNTER — Other Ambulatory Visit (HOSPITAL_COMMUNITY): Payer: Self-pay

## 2023-01-03 DIAGNOSIS — G8191 Hemiplegia, unspecified affecting right dominant side: Secondary | ICD-10-CM | POA: Diagnosis present

## 2023-01-03 DIAGNOSIS — I4891 Unspecified atrial fibrillation: Secondary | ICD-10-CM | POA: Diagnosis present

## 2023-01-03 DIAGNOSIS — I5032 Chronic diastolic (congestive) heart failure: Secondary | ICD-10-CM | POA: Diagnosis present

## 2023-01-03 DIAGNOSIS — Z961 Presence of intraocular lens: Secondary | ICD-10-CM | POA: Diagnosis present

## 2023-01-03 DIAGNOSIS — F32A Depression, unspecified: Secondary | ICD-10-CM | POA: Diagnosis present

## 2023-01-03 DIAGNOSIS — I252 Old myocardial infarction: Secondary | ICD-10-CM | POA: Diagnosis not present

## 2023-01-03 DIAGNOSIS — Z8041 Family history of malignant neoplasm of ovary: Secondary | ICD-10-CM | POA: Diagnosis not present

## 2023-01-03 DIAGNOSIS — Z856 Personal history of leukemia: Secondary | ICD-10-CM | POA: Diagnosis not present

## 2023-01-03 DIAGNOSIS — Z8042 Family history of malignant neoplasm of prostate: Secondary | ICD-10-CM | POA: Diagnosis not present

## 2023-01-03 DIAGNOSIS — Z800 Family history of malignant neoplasm of digestive organs: Secondary | ICD-10-CM | POA: Diagnosis not present

## 2023-01-03 DIAGNOSIS — I482 Chronic atrial fibrillation, unspecified: Secondary | ICD-10-CM | POA: Diagnosis not present

## 2023-01-03 DIAGNOSIS — I69392 Facial weakness following cerebral infarction: Secondary | ICD-10-CM | POA: Diagnosis not present

## 2023-01-03 DIAGNOSIS — Z86010 Personal history of colonic polyps: Secondary | ICD-10-CM | POA: Diagnosis not present

## 2023-01-03 DIAGNOSIS — Z87891 Personal history of nicotine dependence: Secondary | ICD-10-CM | POA: Diagnosis not present

## 2023-01-03 DIAGNOSIS — D72829 Elevated white blood cell count, unspecified: Secondary | ICD-10-CM | POA: Diagnosis present

## 2023-01-03 DIAGNOSIS — E119 Type 2 diabetes mellitus without complications: Secondary | ICD-10-CM | POA: Diagnosis present

## 2023-01-03 DIAGNOSIS — I639 Cerebral infarction, unspecified: Secondary | ICD-10-CM | POA: Diagnosis present

## 2023-01-03 DIAGNOSIS — I2510 Atherosclerotic heart disease of native coronary artery without angina pectoris: Secondary | ICD-10-CM

## 2023-01-03 DIAGNOSIS — R531 Weakness: Secondary | ICD-10-CM | POA: Diagnosis present

## 2023-01-03 DIAGNOSIS — I6381 Other cerebral infarction due to occlusion or stenosis of small artery: Secondary | ICD-10-CM | POA: Diagnosis not present

## 2023-01-03 DIAGNOSIS — R29810 Facial weakness: Secondary | ICD-10-CM | POA: Diagnosis present

## 2023-01-03 DIAGNOSIS — Z8546 Personal history of malignant neoplasm of prostate: Secondary | ICD-10-CM | POA: Diagnosis not present

## 2023-01-03 DIAGNOSIS — J449 Chronic obstructive pulmonary disease, unspecified: Secondary | ICD-10-CM | POA: Diagnosis present

## 2023-01-03 DIAGNOSIS — M810 Age-related osteoporosis without current pathological fracture: Secondary | ICD-10-CM | POA: Diagnosis present

## 2023-01-03 DIAGNOSIS — E785 Hyperlipidemia, unspecified: Secondary | ICD-10-CM | POA: Diagnosis present

## 2023-01-03 DIAGNOSIS — E1169 Type 2 diabetes mellitus with other specified complication: Secondary | ICD-10-CM | POA: Diagnosis not present

## 2023-01-03 DIAGNOSIS — I10 Essential (primary) hypertension: Secondary | ICD-10-CM | POA: Diagnosis present

## 2023-01-03 DIAGNOSIS — C9110 Chronic lymphocytic leukemia of B-cell type not having achieved remission: Secondary | ICD-10-CM | POA: Diagnosis present

## 2023-01-03 DIAGNOSIS — Z794 Long term (current) use of insulin: Secondary | ICD-10-CM

## 2023-01-03 DIAGNOSIS — Z9842 Cataract extraction status, left eye: Secondary | ICD-10-CM | POA: Diagnosis not present

## 2023-01-03 DIAGNOSIS — I77819 Aortic ectasia, unspecified site: Secondary | ICD-10-CM | POA: Diagnosis present

## 2023-01-03 DIAGNOSIS — I110 Hypertensive heart disease with heart failure: Secondary | ICD-10-CM | POA: Diagnosis present

## 2023-01-03 DIAGNOSIS — E669 Obesity, unspecified: Secondary | ICD-10-CM | POA: Diagnosis present

## 2023-01-03 DIAGNOSIS — Z8049 Family history of malignant neoplasm of other genital organs: Secondary | ICD-10-CM | POA: Diagnosis not present

## 2023-01-03 DIAGNOSIS — M79652 Pain in left thigh: Secondary | ICD-10-CM | POA: Diagnosis not present

## 2023-01-03 DIAGNOSIS — Z952 Presence of prosthetic heart valve: Secondary | ICD-10-CM | POA: Diagnosis not present

## 2023-01-03 DIAGNOSIS — H409 Unspecified glaucoma: Secondary | ICD-10-CM | POA: Diagnosis present

## 2023-01-03 DIAGNOSIS — Z7901 Long term (current) use of anticoagulants: Secondary | ICD-10-CM | POA: Diagnosis not present

## 2023-01-03 DIAGNOSIS — Z951 Presence of aortocoronary bypass graft: Secondary | ICD-10-CM | POA: Diagnosis not present

## 2023-01-03 DIAGNOSIS — I69351 Hemiplegia and hemiparesis following cerebral infarction affecting right dominant side: Secondary | ICD-10-CM | POA: Diagnosis present

## 2023-01-03 DIAGNOSIS — Z79899 Other long term (current) drug therapy: Secondary | ICD-10-CM | POA: Diagnosis not present

## 2023-01-03 DIAGNOSIS — K5900 Constipation, unspecified: Secondary | ICD-10-CM | POA: Diagnosis present

## 2023-01-03 DIAGNOSIS — I6523 Occlusion and stenosis of bilateral carotid arteries: Secondary | ICD-10-CM | POA: Diagnosis present

## 2023-01-03 LAB — GLUCOSE, CAPILLARY
Glucose-Capillary: 102 mg/dL — ABNORMAL HIGH (ref 70–99)
Glucose-Capillary: 167 mg/dL — ABNORMAL HIGH (ref 70–99)
Glucose-Capillary: 110 mg/dL — ABNORMAL HIGH (ref 70–99)
Glucose-Capillary: 179 mg/dL — ABNORMAL HIGH (ref 70–99)

## 2023-01-03 LAB — RENAL FUNCTION PANEL
Albumin: 3.5 g/dL (ref 3.5–5.0)
Anion gap: 11 (ref 5–15)
BUN: 16 mg/dL (ref 8–23)
CO2: 22 mmol/L (ref 22–32)
Calcium: 9.1 mg/dL (ref 8.9–10.3)
Chloride: 107 mmol/L (ref 98–111)
Creatinine, Ser: 1.42 mg/dL — ABNORMAL HIGH (ref 0.61–1.24)
GFR, Estimated: 52 mL/min — ABNORMAL LOW (ref >60)
Glucose, Bld: 92 mg/dL (ref 70–99)
Phosphorus: 4.1 mg/dL (ref 2.5–4.6)
Potassium: 4.3 mmol/L (ref 3.5–5.1)
Sodium: 140 mmol/L (ref 135–145)

## 2023-01-03 LAB — MAGNESIUM: Magnesium: 1.7 mg/dL (ref 1.7–2.4)

## 2023-01-03 LAB — HEMOGLOBIN A1C
Hgb A1c MFr Bld: 6.8 % — ABNORMAL HIGH (ref 4.8–5.6)
Mean Plasma Glucose: 148.46 mg/dL

## 2023-01-03 MED ORDER — CYANOCOBALAMIN 500 MCG PO TABS
500.0000 ug | ORAL_TABLET | ORAL | Status: DC
  Administered 2023-01-03 – 2023-01-05 (×2): 500 ug via ORAL
  Filled 2023-01-03 (×2): qty 1

## 2023-01-03 MED ORDER — METOPROLOL SUCCINATE ER 50 MG PO TB24
50.0000 mg | ORAL_TABLET | Freq: Two times a day (BID) | ORAL | Status: DC
  Administered 2023-01-03 – 2023-01-05 (×4): 50 mg via ORAL
  Filled 2023-01-03 (×4): qty 1

## 2023-01-03 NOTE — Progress Notes (Signed)
PROGRESS NOTE  Rodney Friedman IRJ:[id number] DOB: [birth year]   PCP: Ivonne [name], NP  Patient is from: Home.  Lives alone.  DOA: 01/02/2023 LOS: 0  Chief complaints Chief Complaint  Patient presents with   Code Stroke     Brief Narrative / Interim history: 77 year old M with PMH of A-fib on warfarin, CAD/CABG, CLL, prostate cancer, HTN, COPD, OSA, DM-2, AS s/p TAVR and HLD presenting with right-sided weakness and subsequent fall, and found to have acute lacunar infarct of left thalamus and left occipital cortex.  Patient was outside tPA window on arrival to ED.  Reportedly struggling with his INR on warfarin.  Neurology consulted.  Changed warfarin to Eliquis.  CT angio with 60% stenosis in left carotid bifurcation and 40% stenosis in right carotid bifurcation.  TTE with LVEF of 55 to 60% and severe LVH.  LDL 28.  A1c pending.  Neurology recommended Eliquis and signed off.  Therapy recommended CIR.  CIR following.  Subjective: Seen and examined earlier this morning.  No major events overnight of this morning.  Reports improvement on right-sided weakness.  Objective: Vitals:   01/03/23 0350 01/03/23 0739 01/03/23 1136 01/03/23 1540  BP: (!) 153/97 124/71 (!) 153/96 (!) 150/95  Pulse: 95 84 77 82  Resp: 17     Temp: 97.6 F (36.4 C) 97.9 F (36.6 C) 97.6 F (36.4 C) 98 F (36.7 C)  TempSrc: Oral Oral Oral Oral  SpO2: 100% 96% 97% 97%  Weight:   90.4 kg   Height:   5\' 8"  (1.727 m)     Examination:  GENERAL: No apparent distress.  Nontoxic. HEENT: MMM.  Vision and hearing grossly intact.  NECK: Supple.  No apparent JVD.  RESP:  No IWOB.  Fair aeration bilaterally. CVS: Irregular rhythm.  Normal rate.  Heart sounds normal.  ABD/GI/GU: BS+. Abd soft, NTND.  MSK/EXT:  Moves extremities. No apparent deformity. No edema.  SKIN: Scabs on his knees from fall. NEURO: Awake and alert.  Oriented appropriately.  No facial asymmetry.  PERRL.  Speech clear.  Motor 4/5 in right arm  and right leg.  5/5 elsewhere.  Subtle pronator drift on the right.  Diminished light sensation on the right. PSYCH: Calm. Normal affect.   Procedures:  None  Microbiology summarized: None  Assessment and plan: Principal Problem:   Acute ischemic stroke [facility]) Active Problems:   Acute CVA (cerebrovascular accident) (HCC)  Acute ischemic stroke: Presented with right-sided weakness.  MRI showed acute lacunar infarct of left thalamus and left occipital cortex.  Patient was outside tPA window on arrival to ED.  Reportedly struggling with his INR on warfarin.  INR was 2.4 on admission but 1.7 on 8/9).  CT angio with 60% stenosis in left carotid bifurcation and 40% stenosis in right carotid bifurcation.  TTE with LVEF of 55 to 60% and severe LVH.  LDL 28.  A1c 6.8%. -Neurology recommended Eliquis and signed off.   -Therapy recommended CIR.  CIR following. -Follow hemoglobin A1c -Continue home Crestor and Zetia. -Normalize blood pressure   Controlled NIDDM-2: A1c 6.8%.  CBG within acceptable range. Recent Labs  Lab 01/02/23 1756 01/02/23 2120 01/03/23 0638 01/03/23 1138 01/03/23 1541  GLUCAP 111* 132* 102* 167* 110*  -Continue SSI  Hypertension: BP elevated. -Resume home Toprol-XL.   Chronic atrial fibrillation: Rate controlled -Resume home Toprol -Changed anticoagulation from warfarin to Eliquis due to labile INR and stroke  Bilateral carotid artery stenosis: CT angio head and neck as above. -Continue Crestor  and Zetia as above -Continue Eliquis   History of CAD: No cardiopulmonary symptoms -Continue home meds  History of severe AS status post TAVR: Stable -Outpatient follow-up   CLL/history of prostate cancer: Follows urology and Dr. Bertis Ruddy -On yearly surveillance. -Outpatient follow-up    OSA not on CPAP -Monitor for desaturation.   Chronic COPD: Stable.  Quit smoking about 2 years ago. -Continue bronchodilators  Vitamin D deficiency: Seems she is taking  high-dose vitamin D biweekly. -Check vitamin D level -Hold vitamin D  Obesity Body mass index is 30.3 kg/m.         DVT prophylaxis:   apixaban (ELIQUIS) tablet 5 mg  Code Status: Full code Family Communication: None at bedside Level of care: Telemetry Medical Status is: Inpatient Remains inpatient appropriate because: Safe disposition/CIR   Final disposition: CIR Consultants:  Neurology  55 minutes with more than 50% spent in reviewing records, counseling patient/family and coordinating care.   Sch Meds:  Scheduled Meds:  apixaban  5 mg Oral BID   buPROPion ER  100 mg Oral QHS   buPROPion  200 mg Oral q morning   ezetimibe  10 mg Oral Daily   gabapentin  300 mg Oral QHS   insulin aspart  0-5 Units Subcutaneous QHS   insulin aspart  0-9 Units Subcutaneous TID WC   rosuvastatin  20 mg Oral Daily   sodium chloride flush  3 mL Intravenous Once   Continuous Infusions: PRN Meds:.acetaminophen **OR** acetaminophen (TYLENOL) oral liquid 160 mg/5 mL **OR** acetaminophen, albuterol, metoprolol tartrate  Antimicrobials: Anti-infectives (From admission, onward)    None        I have personally reviewed the following labs and images: CBC: Recent Labs  Lab 01/02/23 0115 01/02/23 0120 01/03/23 0321  WBC 14.2*  --  13.4*  NEUTROABS 7.1  --   --   HGB 14.9 15.0 14.2  HCT 44.1 44.0 41.5  MCV 89.3  --  89.6  PLT 175  --  152   BMP &GFR Recent Labs  Lab 01/02/23 0115 01/02/23 0120 01/03/23 0321  NA 139 142 140  K 3.9 4.0 4.3  CL 107 108 107  CO2 19*  --  22  GLUCOSE 127* 122* 92  BUN 13 15 16   CREATININE 1.37* 1.30* 1.42*  CALCIUM 9.4  --  9.1  MG  --   --  1.7  PHOS  --   --  4.1   Estimated Creatinine Clearance: 49.1 mL/min (A) (by C-G formula based on SCr of 1.42 mg/dL (H)). Liver & Pancreas: Recent Labs  Lab 01/02/23 0115 01/03/23 0321  AST 31  --   ALT 25  --   ALKPHOS 35*  --   BILITOT 0.9  --   PROT 6.6  --   ALBUMIN 3.8 3.5   No  results for input(s): "LIPASE", "AMYLASE" in the last 168 hours. No results for input(s): "AMMONIA" in the last 168 hours. Diabetic: Recent Labs    01/03/23 0321  HGBA1C 6.8*   Recent Labs  Lab 01/02/23 1756 01/02/23 2120 01/03/23 0638 01/03/23 1138 01/03/23 1541  GLUCAP 111* 132* 102* 167* 110*   Cardiac Enzymes: No results for input(s): "CKTOTAL", "CKMB", "CKMBINDEX", "TROPONINI" in the last 168 hours. No results for input(s): "PROBNP" in the last 8760 hours. Coagulation Profile: Recent Labs  Lab 12/29/22 0844 01/02/23 0115 01/03/23 0321  INR 1.7* 2.4* 2.9*   Thyroid Function Tests: No results for input(s): "TSH", "T4TOTAL", "FREET4", "T3FREE", "THYROIDAB" in the last  72 hours. Lipid Profile: Recent Labs    01/03/23 0321  CHOL 88  HDL 32*  LDLCALC 28  TRIG 578  CHOLHDL 2.8   Anemia Panel: No results for input(s): "VITAMINB12", "FOLATE", "FERRITIN", "TIBC", "IRON", "RETICCTPCT" in the last 72 hours. Urine analysis:    Component Value Date/Time   COLORURINE YELLOW 01/02/2023 0748   APPEARANCEUR CLEAR 01/02/2023 0748   APPEARANCEUR Clear 02/09/2017 1238   LABSPEC 1.043 (H) 01/02/2023 0748   PHURINE 5.0 01/02/2023 0748   GLUCOSEU NEGATIVE 01/02/2023 0748   HGBUR NEGATIVE 01/02/2023 0748   BILIRUBINUR NEGATIVE 01/02/2023 0748   BILIRUBINUR Negative 02/09/2017 1238   KETONESUR NEGATIVE 01/02/2023 0748   PROTEINUR NEGATIVE 01/02/2023 0748   UROBILINOGEN 0.2 06/04/2014 1539   NITRITE NEGATIVE 01/02/2023 0748   LEUKOCYTESUR NEGATIVE 01/02/2023 0748   Sepsis Labs: Invalid input(s): "PROCALCITONIN", "LACTICIDVEN"  Microbiology: No results found for this or any previous visit (from the past 240 hour(s)).  Radiology Studies: No results found.     T.  [facility]  If 7PM-7AM, please contact night-coverage [email or web address] 01/03/2023, 4:19 PM

## 2023-01-03 NOTE — TOC Initial Note (Signed)
Transition of Care [facility]) - Initial/Assessment Note    Patient Details  Name: Rodney Friedman MRN: [id number] Date of Birth: 1946/01/29  Transition of Care [facility]) CM/SW Contact:    Kermit Balo, RN Phone Number: 01/03/2023, 4:08 PM  Clinical Narrative:                 Pt is from home alone.  CM consulted for medication questions per pt. CM met with the patient and answer all questions.  See pharmacy note about pts Elquis copays. He has not met deductible yet. He can get 30 day free Eliquis card at d/c and hopefully will meet his deductible prior to 2nd month of Eliquis. CIR plans to admit when bed available.  TOC following.  Expected Discharge Plan: IP Rehab Facility Barriers to Discharge: Continued Medical Work up   Patient Goals and CMS Choice   CMS Medicare.gov Compare Post Acute Care list provided to:: Patient Choice offered to / list presented to : Patient      Expected Discharge Plan and Services   Discharge Planning Services: CM Consult Post Acute Care Choice: IP Rehab Living arrangements for the past 2 months: Single Family Home                                      Prior Living Arrangements/Services Living arrangements for the past 2 months: Single Family Home Lives with:: Self Patient language and need for interpreter reviewed:: Yes Do you feel safe going back to the place where you live?: Yes        Care giver support system in place?: No (comment)   Criminal Activity/Legal Involvement Pertinent to Current Situation/Hospitalization: No - Comment as needed  Activities of Daily Living Home Assistive Devices/Equipment: None ADL Screening (condition at time of admission) Patient's cognitive ability adequate to safely complete daily activities?: Yes Is the patient deaf or have difficulty hearing?: No Does the patient have difficulty seeing, even when wearing glasses/contacts?: No Does the patient have difficulty concentrating, remembering, or making  decisions?: No Patient able to express need for assistance with ADLs?: Yes Does the patient have difficulty dressing or bathing?: No Independently performs ADLs?: Yes (appropriate for developmental age) Does the patient have difficulty walking or climbing stairs?: Yes Weakness of Legs: Right Weakness of Arms/Hands: Right  Permission Sought/Granted                  Emotional Assessment Appearance:: Appears stated age Attitude/Demeanor/Rapport: Engaged Affect (typically observed): Accepting Orientation: : Oriented to Self, Oriented to Place, Oriented to  Time, Oriented to Situation   Psych Involvement: No (comment)  Admission diagnosis:  Ataxia [R27.0] Acute ischemic stroke [facility]) [I63.9] Acute CVA (cerebrovascular accident) [facility]) [I63.9] Patient Active Problem List   Diagnosis Date Noted   Acute CVA (cerebrovascular accident) (HCC) 01/03/2023   Acute ischemic stroke (HCC) 01/02/2023   Diverticulosis 10/12/2022   Health care maintenance 02/23/2022   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   Chronic pain 03/14/2019   Essential hypertension 07/27/2018   Diabetes mellitus type 2 in obese 07/27/2018   Paroxysmal atrial fibrillation (HCC) 04/21/2016   Diabetes mellitus (HCC) 04/21/2016   OSA (obstructive sleep apnea) 04/21/2016   Quality of life palliative care encounter 01/03/2016   Tremor, essential 09/22/2015   Glaucoma, open angle 06/16/2015   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   Pseudoaphakia 06/15/2015   CD (contact dermatitis) 04/29/2014  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   Hematuria 01/05/2014   Skin lesion 01/05/2014   Severe aortic stenosis 01/03/2014   Hypertensive heart disease 01/03/2014   Mixed hyperlipidemia 01/03/2014   Mild obesity 01/03/2014   Chronic atrial fibrillation (HCC) 12/31/2013   Long term (current) use of anticoagulants 12/31/2013   Cataract, nuclear 10/06/2013   Dermatochalasis of eyelid 10/06/2013    Chorioretinal scar, 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/03/2013   Pre-diabetes 09/03/2013   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   Prostate cancer (HCC) 07/08/2013   PCP:  Ivonne [name], NP Pharmacy:   [id number] [place], [place] - [address] [place] [address] Phone: [phone] Fax: [phone]     Social Determinants of Health (SDOH) Social History: SDOH Screenings   Food Insecurity: No Food Insecurity (01/02/2023)  Housing: Low Risk  (01/02/2023)  Transportation Needs: No Transportation Needs (01/02/2023)  Utilities: Not At Risk (01/02/2023)  Alcohol Screen: Low Risk  (05/18/2022)  Depression (PHQ2-9): Low Risk  (11/27/2022)  Financial Resource Strain: Low Risk  (05/18/2022)  Physical Activity: Insufficiently Active (05/18/2022)  Social Connections: Moderately Integrated (05/18/2022)  Stress: Stress Concern Present (05/18/2022)  Tobacco Use: Medium Risk (01/02/2023)   SDOH Interventions:     Readmission Risk Interventions     No data to display

## 2023-01-03 NOTE — Progress Notes (Signed)
Physical Therapy Treatment Patient Details Name: Rodney Friedman MRN: [id number] DOB: [birth year] Today's Date: 01/03/2023   History of Present Illness Pt is 77 yo male admitted on 01/02/23 with CVA - acute lacunar infarct of L thalamus and tiny acute left occipital cortex infarct.  Pt with hx including but not limited to CLL, prostate cancer, CAD, diabetes mellitus on metformin, chronic atrial fibrillation on warfarin, history of severe aortic stenosis status post TAVR, dyslipidemia and sleep apnea.    PT Comments  Progressing towards acute rehab goals. CGA for transfer with heavy reliance on RW and effort to rise from bed. Still requires up to min assist for correcting LOB while ambulating short distances without an assistive device. CGA with RW which was reviewed, slow and shuffled up to 80 feet today. Lt knee shows signs of instability as well, hyperextending in mid stance intermittently, pt aware, responds well to cues for RW support closer to proximity to assist with stabilizing while ambulating. Recommend AIR to reduce fall risk prior to returning home. Suspect he could reach Mod I level with short duration, post acute rehab program, and reduce likelihood of falls/readmission. Patient will continue to benefit from skilled physical therapy services to further improve independence with functional mobility.    If plan is discharge home, recommend the following: A little help with walking and/or transfers;A little help with bathing/dressing/bathroom;Assistance with cooking/housework;Help with stairs or ramp for entrance   Can travel by private vehicle        Equipment Recommendations  None recommended by PT    Recommendations for Other Services Rehab consult     Precautions / Restrictions Precautions Precautions: Fall Precaution Comments: RUE and LE ataxia Restrictions Weight Bearing Restrictions: No     Mobility  Bed Mobility Overal bed mobility: Needs Assistance Bed Mobility:  Supine to Sit, Sit to Supine     Supine to sit: HOB elevated, Supervision Sit to supine: Supervision   General bed mobility comments: Supervision for safety, requires extra time to get LEs to EOB but did not require physical assist.    Transfers Overall transfer level: Needs assistance Equipment used: Rolling walker (2 wheels) Transfers: Sit to/from Stand Sit to Stand: Contact guard assist           General transfer comment: Close guard for safety. Slow and effortful rise, cues for technique, leans towards Rt but able to stabilize once upright with BIL support on RW.    Ambulation/Gait Ambulation/Gait assistance: Min assist Gait Distance (Feet): 80 Feet Assistive device: Rolling walker (2 wheels), None Gait Pattern/deviations: Decreased stride length, Step-through pattern, Knees buckling, Knee hyperextension - left, Shuffle, Ataxic, Trunk flexed, Staggering right Gait velocity: decreased Gait velocity interpretation: <1.31 ft/sec, indicative of household ambulator   General Gait Details: Reviewed safe AD use with RW for support. Slow and shuffled, Lt knee unstable with intermittent hyperextension. Minimal buckling of RLE, no overt LOB with RW for support, cues for upright posture, proximity to walker, and awareness. Without AD pt requires min assist for LOB, and drifting towards Rt.   Stairs             Wheelchair Mobility     Tilt Bed    Modified Rankin (Stroke Patients Only) Modified Rankin (Stroke Patients Only) Pre-Morbid Rankin Score: No symptoms Modified Rankin: Moderately severe disability     Balance Overall balance assessment: Needs assistance Sitting-balance support: No upper extremity supported Sitting balance-Leahy Scale: Good     Standing balance support: Reliant on assistive device for balance, No  upper extremity supported Standing balance-Leahy Scale: Fair Standing balance comment: Unsteady but able to stand briefly with poor posture, slight  Rt lean, no UE, CGA.                            Cognition Arousal: Alert Behavior During Therapy: WFL for tasks assessed/performed Overall Cognitive Status: Within Functional Limits for tasks assessed                                 General Comments: Cognitively intact but exhibits some decreased anticipatory awareness with concerns of needing to get back home as soon as possible to pay bills.        Exercises General Exercises - Lower Extremity Ankle Circles/Pumps: AROM, Both, 10 reps, Supine Quad Sets: Strengthening, Both, 10 reps, Supine Gluteal Sets: Strengthening, Both, 10 reps, Supine Hip ABduction/ADduction: Strengthening, Both, 10 reps, Supine    General Comments        Pertinent Vitals/Pain Pain Assessment Pain Assessment: No/denies pain Faces Pain Scale: No hurt    Home Living                          Prior Function            PT Goals (current goals can now be found in the care plan section) Acute Rehab PT Goals Patient Stated Goal: return home PT Goal Formulation: With patient Time For Goal Achievement: 01/16/23 Potential to Achieve Goals: Good Progress towards PT goals: Progressing toward goals    Frequency    Min 1X/week      PT Plan      Co-evaluation              AM-PAC PT "6 Clicks" Mobility   Outcome Measure  Help needed turning from your back to your side while in a flat bed without using bedrails?: A Little Help needed moving from lying on your back to sitting on the side of a flat bed without using bedrails?: A Little Help needed moving to and from a bed to a chair (including a wheelchair)?: A Little Help needed standing up from a chair using your arms (e.g., wheelchair or bedside chair)?: A Little Help needed to walk in hospital room?: A Little Help needed climbing 3-5 steps with a railing? : A Lot 6 Click Score: 17    End of Session Equipment Utilized During Treatment: Gait  belt Activity Tolerance: Patient tolerated treatment well Patient left: in bed;with call bell/phone within reach;with bed alarm set Nurse Communication: Mobility status PT Visit Diagnosis: Other abnormalities of gait and mobility (R26.89);Hemiplegia and hemiparesis;Ataxic gait (R26.0) Hemiplegia - Right/Left: Right Hemiplegia - dominant/non-dominant: Dominant Hemiplegia - caused by: Cerebral infarction     Time: 2536-6440 PT Time Calculation (min) (ACUTE ONLY): 15 min  Charges:    $Gait Training: 8-22 mins PT General Charges $$ ACUTE PT VISIT: 1 Visit                     Kathlyn Sacramento, PT, DPT [facility]  Rehabilitation Services Physical Therapist Office: [phone] Website: [email or web address]    Berton Mount 01/03/2023, 2:47 PM

## 2023-01-03 NOTE — TOC Benefit Eligibility Note (Signed)
Patient Product/process development scientist completed.    The patient is insured through [employer]. Patient has Medicare and is not eligible for a copay card, but may be able to apply for patient assistance, if available.    Ran test claim for Eliquis 5 mg and the current 30 day co-pay is $353.00 due to a $545.00 deductible.   This test claim was processed through [facility]- copay amounts may vary at other pharmacies due to pharmacy/plan contracts, or as the patient moves through the different stages of their insurance plan.     Roland Earl, CPHT Pharmacy Patient Advocate Specialist [facility] Patient Advocate Team Direct Number: [phone]  Fax: [phone]

## 2023-01-03 NOTE — Progress Notes (Signed)
Inpatient Rehab Admissions Coordinator:    I met with Pt. To discuss potential CIR admit. Pt. Is interested. States that he does not have support, but he's doing well and is cognitively intact so I think mod I goals are reasonable. Rehab MD also recommended Mod I goals.  I will pursue for admit pending bed availability.   Megan Salon, MS, CCC-SLP Rehab Admissions Coordinator  [phone] (celll) [phone] (office)

## 2023-01-03 NOTE — PMR Pre-admission (Signed)
PMR Admission Coordinator Pre-Admission Assessment  Patient: Rodney Friedman is an 77 y.o., male MRN: [id number] DOB: 1946-01-25 Height: 5\' 8"  (172.7 cm) Weight: 90.4 kg  Insurance Information HMO:     PPO:      PCP:      IPA:      80/20:      OTHER:  PRIMARY: Medicare AB      Policy#: [id number]      Subscriber:  Phone#: Verified online    Fax#:  Pre-Cert#:       Employer:  Benefits:  Phone #:      Name:  Eff. Date: Parts A effective 01/21/2007  and B effective 08/21/2011 Deduct: $1632      Out of Pocket Max:  None      Life Max: N/A  CIR: 100%      SNF: 100 days Outpatient: 80%     Co-Pay: 20% Home Health: 100%      Co-Pay: none DME: 80%     Co-Pay: 20% Providers: patient's choice Providers: in network  SECONDARY: Si Gaul      Policy#:  [id number]      Financial Counselor:       Phone#:   The "Data Collection Information Summary" for patients in Inpatient Rehabilitation Facilities with attached "Privacy Act Statement-Health Care Records" was provided and verbally reviewed with: Pt  Emergency Contact Information Contact Information     Name Relation Home Work [place] Friend   [phone]      Other Contacts     Name Relation Home Work Mobile   Halle,Carlos Son [phone]  [phone]   D'Agostino,Cristina Daughter   [phone]       Current Medical History  Patient Admitting Diagnosis: CVA History of Present Illness: Rodney Friedman is a 77 y.o. 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  He also has dysarthria. CT head 8/13 with no acute abnormality.  CTA 8/13 negative for large vessel occlusion, positive for calcified plaque at carotid bifurcations with  stenosis bilaterally.  MRI brain indicated acute lacunar infarct of the left thalamus, tiny acute left occipital cortex infarct.  Echo with EF 55 to 60%, left ventricular regional wall motion abnormalities, left ventricular hypertrophy of the basal septal segment.  Patient's warfarin was changed to Eliquis. Pt. Seen by PT/OT and they recommend CIR to assist return to PLOF.   Complete NIHSS TOTAL: 3  [facility]  has been reviewed by the rehabilitation admission coordinator and physician.  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 Clifton James & Dr. Laneta Simmers   Sleep apnea    Substance abuse [facility])    Tremor, essential 09/22/2015    Has the patient had major surgery during 100 days prior to admission? No  Family History   family history includes Colon cancer in his mother; Hypertension in his father; Ovarian cancer in his  mother; Prostate cancer in his father; Uterine cancer in his mother.  Current Medications  Current Facility-Administered Medications:    acetaminophen (TYLENOL) tablet 650 mg, 650 mg, Oral, Q4H PRN **OR** acetaminophen (TYLENOL) 160 MG/5ML solution 650 mg, 650 mg, Per Tube, Q4H PRN **OR** acetaminophen (TYLENOL) suppository 650 mg, 650 mg, Rectal, Q4H PRN, Russella Dar, NP   albuterol (PROVENTIL) (2.5 MG/3ML) 0.083% nebulizer solution 2.5 mg, 2.5 mg, Inhalation, Q6H PRN, Russella Dar, NP   apixaban Everlene Balls) tablet 5 mg, 5 mg, Oral, BID, Meryl Dare, MD, 5 mg at 01/03/23 4540   buPROPion ER (WELLBUTRIN SR) 12 hr tablet 100 mg, 100 mg, Oral, QHS, Russella Dar, NP, 100 mg at 01/02/23 2108   buPROPion ER (WELLBUTRIN SR) 12 hr tablet 200 mg, 200 mg, Oral, q morning, Russella Dar, NP, 200 mg at 01/03/23 9811   ezetimibe (ZETIA) tablet 10 mg, 10 mg, Oral, Daily, Russella Dar, NP, 10 mg at 01/03/23 9147   gabapentin (NEURONTIN) capsule 300 mg, 300 mg, Oral, QHS, Russella Dar, NP, 300 mg at 01/02/23 2108   insulin aspart (novoLOG) injection 0-5 Units, 0-5 Units, Subcutaneous, QHS, Junious Silk L, NP   insulin aspart (novoLOG) injection 0-9 Units, 0-9 Units, Subcutaneous, TID WC, Russella Dar, NP, 2 Units at 01/03/23 1228   metoprolol tartrate (LOPRESSOR) injection 5 mg, 5 mg, Intravenous, Q4H PRN, Russella Dar, NP   rosuvastatin (CRESTOR) tablet 20 mg, 20 mg, Oral, Daily, Junious Silk L, NP, 20 mg at 01/03/23 8295   sodium chloride flush (NS) 0.9 % injection 3 mL, 3 mL, Intravenous, Once, Mesner, Barbara Cower, MD  Patients Current Diet:  Diet Order             Diet Carb Modified Fluid consistency: Thin; Room service appropriate? Yes  Diet effective now                   Precautions / Restrictions Precautions Precautions: Fall Precaution Comments: RUE and LE ataxia Restrictions Weight Bearing Restrictions: No   Has the patient had 2 or more falls or a fall  with injury in the past year? Yes  Prior Activity Level Community (5-7x/wk): Pt. active in the community PTA  Prior Functional Level Self Care: Did the patient need help bathing, dressing, using the toilet or eating? Independent  Indoor Mobility: Did the patient need assistance with walking from room to room (with or without device)? Independent  Stairs: Did the patient need assistance with internal or external stairs (with or without device)? Independent  Functional Cognition: Did the patient need help planning  regular tasks such as shopping or remembering to take medications? Independent  Patient Information Are you of Hispanic, Latino/a,or Spanish origin?: A. No, not of Hispanic, Latino/a, or Spanish origin What is your race?: A. White Do you need or want an interpreter to communicate with a doctor or health care staff?: 0. No  Patient's Response To:  Health Literacy and Transportation Is the patient able to respond to health literacy and transportation needs?: No Health Literacy - How often do you need to have someone help you when you read instructions, pamphlets, or other written material from your doctor or pharmacy?: Never In the past 12 months, has lack of transportation kept you from medical appointments or from getting medications?: No In the past 12 months, has lack of transportation kept you from meetings, work, or from getting things needed for daily living?: No  Journalist, newspaper / Equipment Home Assistive Devices/Equipment: None Home Equipment: Grab bars - tub/shower, Shower seat, Agricultural consultant (2 wheels), [employer] - single point, Transport chair  Prior Device Use: Indicate devices/aids used by the patient prior to current illness, exacerbation or injury? None of the above  Current Functional Level Cognition  Arousal/Alertness: Awake/alert Overall Cognitive Status: Within Functional Limits for tasks assessed Orientation Level: Oriented X4 General Comments:  Cognitively intact but exhibits some decreased anticipatory awareness with concerns of needing to get back home as soon as possible to pay bills. Attention: Focused, Sustained Focused Attention: Appears intact Sustained Attention: Appears intact Memory: Impaired Memory Impairment: Decreased short term memory Awareness: Appears intact Problem Solving: Appears intact Executive Function: Reasoning Reasoning: Appears intact Safety/Judgment: Appears intact    Extremity Assessment (includes Sensation/Coordination)  Upper Extremity Assessment: RUE deficits/detail RUE Deficits / Details: Strength 4/5 throughout.  Pt with increased dysmetria with finger to nose, unable to tie his gown in the front secondary to ataxia and decreased FM coordination. RUE Sensation: WNL RUE Coordination: decreased fine motor, decreased gross motor LUE Deficits / Details: LUE strength WFLs, slight tremor noted which patient reports that he has a history of. LUE Coordination: decreased gross motor  Lower Extremity Assessment: Defer to PT evaluation RLE Deficits / Details: ROM WFL; MMT 5/5 ankle, 5/5 knee ext, 5/5 hip flex RLE Sensation: WNL RLE Coordination: decreased gross motor (slight decrease with heel/shin, and mild ataxia) LLE Deficits / Details: ROM WFL; MMT 5/5 LLE Sensation: WNL    ADLs  Overall ADL's : Needs assistance/impaired Eating/Feeding: Set up, Sitting Grooming: Wash/dry hands, Wash/dry face, Contact guard assist, Standing Upper Body Bathing: Set up, Sitting Upper Body Bathing Details (indicate cue type and reason): simulated Lower Body Bathing: Minimal assistance, Sit to/from stand Upper Body Dressing : Minimal assistance, Sitting Upper Body Dressing Details (indicate cue type and reason): assist for tying gown in the front Lower Body Dressing: Minimal assistance, Sit to/from stand Toilet Transfer: Minimal assistance, Ambulation Toilet Transfer Details (indicate cue type and reason): min  assist without use of an assistive device, min guard with use Toileting- Clothing Manipulation and Hygiene: Minimal assistance, Sit to/from stand Functional mobility during ADLs: Minimal assistance (hand held assist on the right and pt holding IV pole on the left) General ADL Comments: Pt with increased ataxia noted with RUE use and in the RLE with standing and mobility.  Needs UE support during standing and mobility for safety.  Pt lives alone and was driving as needed to appointments.  Son lives in the area but is not able to assist consistently.    Mobility  Overal bed mobility:  Needs Assistance Bed Mobility: Supine to Sit, Sit to Supine Supine to sit: HOB elevated, Supervision Sit to supine: Supervision General bed mobility comments: Supervision for safety, requires extra time to get LEs to EOB but did not require physical assist.    Transfers  Overall transfer level: Needs assistance Equipment used: Rolling walker (2 wheels) Transfers: Sit to/from Stand Sit to Stand: Contact guard assist Bed to/from chair/wheelchair/BSC transfer type:: Step pivot Step pivot transfers: Min assist General transfer comment: Close guard for safety. Slow and effortful rise, cues for technique, leans towards Rt but able to stabilize once upright with BIL support on RW.    Ambulation / Gait / Stairs / Wheelchair Mobility  Ambulation/Gait Ambulation/Gait assistance: Editor, commissioning (Feet): 80 Feet Assistive device: Rolling walker (2 wheels), None Gait Pattern/deviations: Decreased stride length, Step-through pattern, Knees buckling, Knee hyperextension - left, Shuffle, Ataxic, Trunk flexed, Staggering right General Gait Details: Reviewed safe AD use with RW for support. Slow and shuffled, Lt knee unstable with intermittent hyperextension. Minimal buckling of RLE, no overt LOB with RW for support, cues for upright posture, proximity to walker, and awareness. Without AD pt requires min assist for LOB,  and drifting towards Rt. Gait velocity: decreased Gait velocity interpretation: <1.31 ft/sec, indicative of household ambulator    Posture / Balance Balance Overall balance assessment: Needs assistance Sitting-balance support: No upper extremity supported Sitting balance-Leahy Scale: Good Standing balance support: Reliant on assistive device for balance, No upper extremity supported Standing balance-Leahy Scale: Fair Standing balance comment: Unsteady but able to stand briefly with poor posture, slight Rt lean, no UE, CGA.    Special needs/care consideration Special service needs none   Previous Home Environment (from acute therapy documentation) Living Arrangements: Alone  Lives With: Alone Available Help at Discharge: Family, Available PRN/intermittently, Friend(s) Type of Home: House Home Layout: One level Home Access: Stairs to enter Entrance Stairs-Rails: Right, Left Entrance Stairs-Number of Steps: 4 Bathroom Shower/Tub: Psychologist, counselling, Engineer, manufacturing systems: Handicapped height Bathroom Accessibility: Yes Home Care Services: No Additional Comments: Pt reports son is single father and not able to assist much.  States he has a couple friends from AA could help occasionally.  Discharge Living Setting Plans for Discharge Living Setting: Patient's home Type of Home at Discharge: House Discharge Home Layout: One level Discharge Home Access: Stairs to enter Entrance Stairs-Rails: Left, Right Entrance Stairs-Number of Steps: 4 Discharge Bathroom Shower/Tub: Tub/shower unit, Walk-in shower Discharge Bathroom Toilet: Handicapped height Discharge Bathroom Accessibility: Yes How Accessible: Accessible via walker Does the patient have any problems obtaining your medications?: No  Social/Family/Support Systems Contact Information: Pt. states son cannot assist, he needs mod I goals. Caregiver Availability: Other (Comment) Discharge Plan Discussed with Primary Caregiver:  No  Goals Patient/Family Goal for Rehab: PT/OT Mod I Expected length of stay: 5-7 days Pt/Family Agrees to Admission and willing to participate: Yes Program Orientation Provided & Reviewed with Pt/Caregiver Including Roles  & Responsibilities: Yes  Decrease burden of Care through IP rehab admission: not anticiapted   Possible need for SNF placement upon discharge: not anticipated  Patient Condition: I have reviewed medical records from [facility], spoken with CM, and patient. I met with patient at the bedside for inpatient rehabilitation assessment.  Patient will benefit from ongoing PT and OT, can actively participate in 3 hours of therapy a day 5 days of the week, and can make measurable gains during the admission.  Patient will also benefit from the coordinated team  approach during an Inpatient Acute Rehabilitation admission.  The patient will receive intensive therapy as well as Rehabilitation physician, nursing, social worker, and care management interventions.  Due to safety, skin/wound care, disease management, medication administration, pain management, and patient education the patient requires 24 hour a day rehabilitation nursing.  The patient is currently min A to contact guard  with mobility and basic ADLs.  Discharge setting and therapy post discharge at home with home health is anticipated.  Patient has agreed to participate in the Acute Inpatient Rehabilitation Program and will admit today.  Preadmission Screen Completed By:  Jeronimo Greaves, 01/03/2023 3:03 PM ______________________________________________________________________   Discussed status with Dr. Riley Kill on 01/05/23 at 930 and received approval for admission today.  Admission Coordinator:  Jeronimo Greaves, CCC-SLP, time 1350/Date 01/05/23   Assessment/Plan: Diagnosis: left thalamic and occipital infarcts Does the need for close, 24 hr/day Medical supervision in concert with the patient's rehab needs make  it unreasonable for this patient to be served in a less intensive setting? Yes Co-Morbidities requiring supervision/potential complications: CLL, COPD, OSA, CAD/AS s/p TAVR Due to bladder management, bowel management, safety, skin/wound care, disease management, medication administration, pain management, and patient education, does the patient require 24 hr/day rehab nursing? Yes Does the patient require coordinated care of a physician, rehab nurse, PT, OT, and SLP to address physical and functional deficits in the context of the above medical diagnosis(es)? Yes Addressing deficits in the following areas: balance, endurance, locomotion, strength, transferring, bowel/bladder control, bathing, dressing, feeding, grooming, toileting, and psychosocial support Can the patient actively participate in an intensive therapy program of at least 3 hrs of therapy 5 days a week? Yes The potential for patient to make measurable gains while on inpatient rehab is excellent Anticipated functional outcomes upon discharge from inpatient rehab: modified independent PT, modified independent OT, n/a SLP Estimated rehab length of stay to reach the above functional goals is: 5-7 days Anticipated discharge destination: Home 10. Overall Rehab/Functional Prognosis: excellent   MD Signature: Ranelle Oyster, MD, [facility] Physical Medicine & Rehabilitation Medical Director Rehabilitation Services 01/05/2023

## 2023-01-03 NOTE — Plan of Care (Signed)
  Problem: Education: Goal: Knowledge of disease or condition will improve Outcome: Progressing   Problem: Education: Goal: Knowledge of patient specific risk factors will improve Loraine Leriche N/A or DELETE if not current risk factor) Outcome: Progressing   Problem: Ischemic Stroke/TIA Tissue Perfusion: Goal: Complications of ischemic stroke/TIA will be minimized Outcome: Progressing   Problem: Self-Care: Goal: Ability to participate in self-care as condition permits will improve Outcome: Progressing   Problem: Self-Care: Goal: Verbalization of feelings and concerns over difficulty with self-care will improve Outcome: Progressing   Problem: Coping: Goal: Ability to adjust to condition or change in health will improve Outcome: Progressing   Problem: Fluid Volume: Goal: Ability to maintain a balanced intake and output will improve Outcome: Progressing   Problem: Education: Goal: Knowledge of General Education information will improve Description: Including pain rating scale, medication(s)/side effects and non-pharmacologic comfort measures Outcome: Progressing   Problem: Activity: Goal: Risk for activity intolerance will decrease Outcome: Progressing   Problem: Nutrition: Goal: Adequate nutrition will be maintained Outcome: Progressing   Problem: Coping: Goal: Level of anxiety will decrease Outcome: Progressing   Problem: Safety: Goal: Ability to remain free from injury will improve Outcome: Progressing

## 2023-01-03 NOTE — Progress Notes (Signed)
Inpatient Rehab Admissions Coordinator:  ? ?Per therapy recommendations,  patient was screened for CIR candidacy by Laura Staley, MS, CCC-SLP. At this time, Pt. Appears to be a a potential candidate for CIR. I will place   order for rehab consult per protocol for full assessment. Please contact me any with questions. ? ?Laura Staley, MS, CCC-SLP ?Rehab Admissions Coordinator  ?[phone] (celll) ?[phone] (office) ? ?

## 2023-01-03 NOTE — Consult Note (Addendum)
Physical Medicine and Rehabilitation Consult Reason for Consult:Rehab Referring Physician: Dr. Alanda Slim   HPI: Rodney Friedman is a 77 y.o. 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 Bertis Ruddy, prostate cancer history, chronic A-fib on warfarin, diabetes mellitus, hypertension, COPD, OSA, obesity, CAD, history of aortic stenosis status post TAVR who presented to the hospital with right leg weakness and coordination abnormalities.  He also has dysarthria. CT head 8/13 with no acute abnormality.  CTA 8/13 negative for large vessel occlusion, positive for calcified plaque at carotid bifurcations with stenosis bilaterally.  MRI brain indicated acute lacunar infarct of the left thalamus, tiny acute left occipital cortex infarct.  Echo with EF 55 to 60%, left ventricular regional wall motion abnormalities, left ventricular hypertrophy of the basal septal segment.  Patient's warfarin was changed to Eliquis.  Evaluated by physical therapy and Occupational Therapy and felt to be min assist for most activities.   Patient lives in a [place] home with 4 steps to enter.  He has a son who can assist him intermittently.  Review of Systems  Constitutional:  Negative for chills and fever.  HENT:  Negative for congestion.   Eyes:        Chronic vision loss R eye  Respiratory:  Negative for cough.   Gastrointestinal:  Negative for abdominal pain.  Musculoskeletal:  Positive for back pain.  Skin:  Negative for rash.  Neurological:  Positive for weakness.   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 Clifton James & Dr. 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   Past Surgical History:  Procedure Laterality Date   Arm Surgery Right    from door accident with glass   CARDIAC CATHETERIZATION     CATARACT EXTRACTION W/ INTRAOCULAR LENS IMPLANT Bilateral    COLONOSCOPY     CORONARY ARTERY BYPASS GRAFT  08/04/2004   EYE SURGERY     POLYPECTOMY     RIGHT/LEFT HEART CATH AND CORONARY/GRAFT ANGIOGRAPHY N/A 01/20/2021   Procedure: RIGHT/LEFT HEART CATH AND CORONARY/GRAFT ANGIOGRAPHY;  Surgeon: Kathleene Hazel, MD;  Location: [facility] INVASIVE CV LAB;  Service: Cardiovascular;  Laterality: N/A;   TEE WITHOUT CARDIOVERSION N/A 02/08/2021  Procedure: TRANSESOPHAGEAL ECHOCARDIOGRAM (TEE);  Surgeon: Kathleene Hazel, MD;  Location: [facility] INVASIVE CV LAB;  Service: Open Heart Surgery;  Laterality: N/A;   TRANSCATHETER AORTIC VALVE REPLACEMENT, CAROTID Left 02/08/2021   Procedure: TRANSCATHETER AORTIC VALVE REPLACEMENT, LEFT CAROTID;  Surgeon: Kathleene Hazel, MD;  Location: [facility] INVASIVE CV LAB;  Service: Open Heart Surgery;  Laterality: Left;   Family History  Problem Relation Age of Onset   Colon cancer Mother    Ovarian cancer Mother    Uterine cancer Mother    Hypertension Father    Prostate cancer Father    Colon polyps Neg Hx     Social History:  reports that he quit smoking about 2 years ago. His smoking use included cigarettes. He started smoking about 61 years ago. He has a 44.3 pack-year smoking history. He has been exposed to tobacco smoke. He has never used smokeless tobacco. He reports that he does not drink alcohol and does not use drugs. Allergies:  Allergies  Allergen Reactions   Other Rash    Allergen: "Plants and bushes while doing yard workAdministrator, Civil Service Extract Rash   Medications Prior to Admission  Medication Sig Dispense Refill   warfarin (COUMADIN) 4 MG tablet TAKE 1/2 TO 1 TABLET DAILY AS DIRECTED BY COUMADIN CLINIC 90 tablet 1   albuterol (VENTOLIN HFA) 108 (90 Base) MCG/ACT inhaler Inhale 2 puffs into the lungs every 6 (six) hours as needed for wheezing or shortness of breath. 8 g 11   amoxicillin (AMOXIL) 500 MG tablet Take 4 tablets (2000 mg) by mouth ONE HOUR before any dental procedures. 12 tablet 4   ascorbic acid (VITAMIN C) 1000 MG tablet Take 1,000 mg by mouth daily.     ascorbic acid (VITAMIN C) 500 MG tablet Take 1-2 tablets (500-1,000 mg total) by mouth See admin instructions. Alternate taking 500 mg one day and 1000 mg the next 60 tablet 2   B Complex Vitamins (VITAMIN-B COMPLEX PO) Take 1 tablet by mouth daily with supper.     BIOTIN PO Take 1 tablet by mouth every evening.     buPROPion (WELLBUTRIN SR) 200 MG 12 hr tablet Take 1 tablet (200 mg total) by mouth every morning. 90 tablet 2   buPROPion ER (WELLBUTRIN SR) 100 MG 12 hr tablet 1 qhs 90 tablet 4   Calcium Carb-Cholecalciferol 500-10 MG-MCG TABS Take 1 tablet by mouth daily in the afternoon.     Calcium Carbonate-Vit D-Min (CALCIUM 1200 PO) Take 1,200 mg by mouth every evening.     calcium elemental as carbonate (TUMS ULTRA 1000) 400 MG chewable tablet Chew 1,000 mg by mouth daily as needed for heartburn.     Cholecalciferol 1.25 MG (50000 UT) capsule Take 50,000 Units by mouth. Every other Saturday     docusate sodium  (COLACE) 100 MG capsule Take 1 capsule (100 mg total) by mouth every evening. 10 capsule 3   ezetimibe (ZETIA) 10 MG tablet Take 1 tablet (10 mg total) by mouth daily. 90 tablet 3   gabapentin (NEURONTIN) 100 MG capsule 3  qhs 270 capsule 1   ketoconazole (NIZORAL) 2 % shampoo Apply 1 application topically 2 (two) times a week. 120 mL 3   latanoprost (XALATAN) 0.005 % ophthalmic solution Place 1 drop into the left eye every other day.     LYCOPENE PO Take 10 mg by mouth every other day.     Magnesium 250 MG TABS Take 250 mg by mouth every 3 (  three) days.     metFORMIN (GLUCOPHAGE) 500 MG tablet Take 1 tablet (500 mg total) by mouth 2 (two) times daily with a meal. 180 tablet 1   metoprolol succinate (TOPROL-XL) 50 MG 24 hr tablet Take 1 tablet (50 mg total) by mouth 2 (two) times daily. Take with or immediately following a meal. 180 tablet 1   niacin (VITAMIN B3) 500 MG tablet Take 500 mg by mouth 3 (three) times a week.     Omega-3 Fatty Acids (FISH OIL) 1000 MG CAPS Take 1,000 mg by mouth daily with supper.     prednisoLONE acetate (PRED FORTE) 1 % ophthalmic suspension Place 1 drop into both eyes. Every other day     rosuvastatin (CRESTOR) 20 MG tablet Take 1 tablet (20 mg total) by mouth daily. 90 tablet 3   Sodium Fluoride (PREVIDENT 5000 DRY MOUTH DT) Place 1 application onto teeth See admin instructions. Apply to gums every night for dry mouth     vitamin B-12 (CYANOCOBALAMIN) 500 MCG tablet Take 1 tablet (500 mcg total) by mouth every other day. 30 tablet 2   Vitamin D, Ergocalciferol, (DRISDOL) 1.25 MG (50000 UNIT) CAPS capsule Take 1 capsule (50,000 Units total) by mouth every Saturday. 4 capsule 2    Home: Home Living Family/patient expects to be discharged to:: Inpatient rehab Living Arrangements: Alone Available Help at Discharge: Family, Available PRN/intermittently, Friend(s) Type of Home: House Home Access: Stairs to enter [employer] of Steps: 4 Entrance  Stairs-Rails: Right, Left Home Layout: One level Bathroom Shower/Tub: Psychologist, counselling, Engineer, manufacturing systems: Handicapped height Bathroom Accessibility: Yes Home Equipment: Grab bars - tub/shower, Shower seat, Agricultural consultant (2 wheels), [employer] - single point, Transport chair Additional Comments: Pt reports son is single father and not able to assist much.  States he has a couple friends from AA could help occasionally.  Lives With: Alone  Functional History: Prior Function Prior Level of Function : Independent/Modified Independent, Driving Mobility Comments: could ambulate iin community without AD ADLs Comments: independent with adls and iadls Functional Status:  Mobility: Bed Mobility Overal bed mobility: Needs Assistance Bed Mobility: Supine to Sit, Sit to Supine Supine to sit: Contact guard, HOB elevated Sit to supine: Contact guard assist General bed mobility comments: Min A to lift trunk from ED stretcher (noted slight bend in bed at knees and unable to straighten , so pt having to sit up on slope) Transfers Overall transfer level: Needs assistance Equipment used: None Transfers: Sit to/from Stand, Bed to chair/wheelchair/BSC Sit to Stand: Min assist Bed to/from chair/wheelchair/BSC transfer type:: Step pivot Step pivot transfers: Min assist General transfer comment: Min hand held assist needed for ambulation without assistive device, min guard with use of the RW for support with mod instructional cueing for hand placement during sit to stand and stand to sit as well as for staying inside of the RW. Ambulation/Gait Ambulation/Gait assistance: Min assist Gait Distance (Feet): 16 Feet Assistive device: Rolling walker (2 wheels) Gait Pattern/deviations: Step-to pattern, Decreased stride length General Gait Details: Decreased speed and step length, reports R LE feels weaker, mild ataxia R LE, fatigued easily Gait velocity: decreased    ADL: ADL Overall ADL's : Needs  assistance/impaired Eating/Feeding: Set up, Sitting Grooming: Wash/dry hands, Wash/dry face, Contact guard assist, Standing Upper Body Bathing: Set up, Sitting Upper Body Bathing Details (indicate cue type and reason): simulated Lower Body Bathing: Minimal assistance, Sit to/from stand Upper Body Dressing : Minimal assistance, Sitting Upper Body Dressing Details (indicate  cue type and reason): assist for tying gown in the front Lower Body Dressing: Minimal assistance, Sit to/from stand Toilet Transfer: Minimal assistance, Ambulation Toilet Transfer Details (indicate cue type and reason): min assist without use of an assistive device, min guard with use Toileting- Clothing Manipulation and Hygiene: Minimal assistance, Sit to/from stand Functional mobility during ADLs: Minimal assistance (hand held assist on the right and pt holding IV pole on the left) General ADL Comments: Pt with increased ataxia noted with RUE use and in the RLE with standing and mobility.  Needs UE support during standing and mobility for safety.  Pt lives alone and was driving as needed to appointments.  Son lives in the area but is not able to assist consistently.  Cognition: Cognition Overall Cognitive Status: Within Functional Limits for tasks assessed Arousal/Alertness: Awake/alert Orientation Level: Oriented X4 Year: 2024 Month: August Day of Week: Correct Attention: Focused, Sustained Focused Attention: Appears intact Sustained Attention: Appears intact Memory: Impaired Memory Impairment: Decreased short term memory Awareness: Appears intact Problem Solving: Appears intact Executive Function: Reasoning Reasoning: Appears intact Safety/Judgment: Appears intact Cognition Arousal: Alert Behavior During Therapy: WFL for tasks assessed/performed Overall Cognitive Status: Within Functional Limits for tasks assessed General Comments: Cognitively intact but exhibits some decreased anticipatory awareness with  concerns of needing to get back home as soon as possible to pay bills, go to his appointments so that he doesn't lose insurance, house, ect.  Blood pressure (!) 153/96, pulse 77, temperature 97.6 F (36.4 C), temperature source Oral, resp. rate 17, height 5\' 8"  (1.727 m), weight 90.4 kg, SpO2 97%. Physical Exam   General:  No apparent distress HEENT: Head is normocephalic, atraumatic, PERRLA, EOMI, sclera anicteric, oral mucosa pink and moist Neck: Supple without JVD or lymphadenopathy Heart: Reg rate and rhythm. No murmurs rubs or gallops Chest: CTA bilaterally without wheezes, rales, or rhonchi; no distress Abdomen: Soft, non-tender, non-distended, bowel sounds positive. Extremities: No clubbing, cyanosis, or edema. Pulses are 2+ Psych: Pt's affect is appropriate. Pt is cooperative Skin: Clean and intact without signs of breakdown, multiple small abrasions noted on his right arm and bilateral legs Neuro: Alert and oriented x 4, follows commands, able to provide coherent history, speech with mild dysarthria.  Able to name and repeat.  Comprehension appears to be intact.  EOMI, sensation intact to bilateral face to light touch, mild right facial weakness-reports chronic for 10 years, tongue midline, shoulder shrug 5 out of 5, hearing intact to voice RUE: 5/5 Deltoid, 5/5 Biceps, 5/5 Triceps, 5/5 Wrist Ext, 5/5 Grip LUE: 5/5 Deltoid, 5/5 Biceps, 5/5 Triceps, 5/5 Wrist Ext, 5/5 Grip RLE: HF 5/5, KE 5/5, KF 5/5, ADF 5/5, APF 5/5 LLE: HF 5/5, KE 5/5, HF, 5/5, ADF 5/5, APF 5/5 Sensory exam normal for light touch and pain in all 4 limbs.  Intention tremor-chronic Finger-nose altered on the right compared to the left Heel-to-shin altered on the right Musculoskeletal:  No hypertonia noted, patient has some soreness in his left thigh-reports this is related to the fall  IVs in bilateral upper extremities   Results for orders placed or performed during the hospital encounter of 01/02/23 (from  the past 24 hour(s))  Glucose, capillary     Status: Abnormal   Collection Time: 01/02/23  5:56 PM  Result Value Ref Range   Glucose-Capillary 111 (H) 70 - 99 mg/dL  Glucose, capillary     Status: Abnormal   Collection Time: 01/02/23  9:20 PM  Result Value Ref Range   Glucose-Capillary  132 (H) 70 - 99 mg/dL   Comment 1 Notify RN    Comment 2 Document in Chart   Lipid panel     Status: Abnormal   Collection Time: 01/03/23  3:21 AM  Result Value Ref Range   Cholesterol 88 0 - 200 mg/dL   Triglycerides 259 <563 mg/dL   HDL 32 (L) >87 mg/dL   Total CHOL/HDL Ratio 2.8 RATIO   VLDL 28 0 - 40 mg/dL   LDL Cholesterol 28 0 - 99 mg/dL  Protime-INR     Status: Abnormal   Collection Time: 01/03/23  3:21 AM  Result Value Ref Range   Prothrombin Time 30.4 (H) 11.4 - 15.2 seconds   INR 2.9 (H) 0.8 - 1.2  CBC     Status: Abnormal   Collection Time: 01/03/23  3:21 AM  Result Value Ref Range   WBC 13.4 (H) 4.0 - 10.5 K/uL   RBC 4.63 4.22 - 5.81 MIL/uL   Hemoglobin 14.2 13.0 - 17.0 g/dL   HCT 56.4 33.2 - 95.1 %   MCV 89.6 80.0 - 100.0 fL   MCH 30.7 26.0 - 34.0 pg   MCHC 34.2 30.0 - 36.0 g/dL   RDW 88.4 16.6 - 06.3 %   Platelets 152 150 - 400 K/uL   nRBC 0.0 0.0 - 0.2 %  Hemoglobin A1c     Status: Abnormal   Collection Time: 01/03/23  3:21 AM  Result Value Ref Range   Hgb A1c MFr Bld 6.8 (H) 4.8 - 5.6 %   Mean Plasma Glucose 148.46 mg/dL  Renal function panel     Status: Abnormal   Collection Time: 01/03/23  3:21 AM  Result Value Ref Range   Sodium 140 135 - 145 mmol/L   Potassium 4.3 3.5 - 5.1 mmol/L   Chloride 107 98 - 111 mmol/L   CO2 22 22 - 32 mmol/L   Glucose, Bld 92 70 - 99 mg/dL   BUN 16 8 - 23 mg/dL   Creatinine, Ser 0.16 (H) 0.61 - 1.24 mg/dL   Calcium 9.1 8.9 - 01.0 mg/dL   Phosphorus 4.1 2.5 - 4.6 mg/dL   Albumin 3.5 3.5 - 5.0 g/dL   GFR, Estimated 52 (L) >60 mL/min   Anion gap 11 5 - 15  Magnesium     Status: None   Collection Time: 01/03/23  3:21 AM  Result Value  Ref Range   Magnesium 1.7 1.7 - 2.4 mg/dL  Glucose, capillary     Status: Abnormal   Collection Time: 01/03/23  6:38 AM  Result Value Ref Range   Glucose-Capillary 102 (H) 70 - 99 mg/dL   Comment 1 Notify RN    Comment 2 Document in Chart   Glucose, capillary     Status: Abnormal   Collection Time: 01/03/23 11:38 AM  Result Value Ref Range   Glucose-Capillary 167 (H) 70 - 99 mg/dL   Comment 1 Notify RN    ECHOCARDIOGRAM COMPLETE BUBBLE STUDY  Result Date: 01/02/2023    ECHOCARDIOGRAM REPORT   Patient Name:   JOURNEY RODABAUGH Date of Exam: 01/02/2023 Medical Rec #:  [id number]       Height:       68.0 in Accession #:    [id number]      Weight:       210.3 lb Date of Birth:  February 12, 1946       BSA:          2.088 m Patient Age:  77 years        BP:           151/77 mmHg Patient Gender: M               HR:           85 bpm. Exam Location:  Inpatient Procedure: 2D Echo, Color Doppler, Cardiac Doppler, Intracardiac Opacification            Agent and Saline Contrast Bubble Study Indications:    Stroke  History:        Patient has prior history of Echocardiogram examinations, most                 recent 01/27/2022. CAD, Stroke, Aortic Valve Disease and s/p TAVR,                 Arrythmias:Atrial Fibrillation; Risk Factors:Diabetes,                 Hypertension, Dyslipidemia and Sleep Apnea.  Sonographer:    Milbert Coulter Referring Phys: [id number] ALLISON L ELLIS IMPRESSIONS  1. Left ventricular ejection fraction, by estimation, is 55 to 60%. The left ventricle has normal function. The left ventricle demonstrates regional wall motion abnormalities (see scoring diagram/findings for description). There is severe asymmetric left ventricular hypertrophy of the basal-septal segment. Left ventricular diastolic parameters are indeterminate.  2. Right ventricular systolic function is normal. The right ventricular size is normal.  3. Left atrial size was severely dilated.  4. Right atrial size was severely dilated.  5. The  mitral valve is degenerative. Mild to moderate mitral valve regurgitation. Mild mitral stenosis. The mean mitral valve gradient is 6.0 mmHg with average heart rate of 70 bpm. Severe mitral annular calcification.  6. The aortic valve has been repaired/replaced. Aortic valve regurgitation is not visualized. Echo findings are consistent with normal structure and function of the aortic valve prosthesis. Aortic valve mean gradient measures 13.0 mmHg.  7. The inferior vena cava is normal in size with greater than 50% respiratory variability, suggesting right atrial pressure of 3 mmHg.  8. Evidence of atrial level shunting detected by color flow Doppler. Agitated saline contrast bubble study was negative, with no evidence of any interatrial shunt. FINDINGS  Left Ventricle: Left ventricular ejection fraction, by estimation, is 55 to 60%. The left ventricle has normal function. The left ventricle demonstrates regional wall motion abnormalities. Definity contrast agent was given IV to delineate the left ventricular endocardial borders. The left ventricular internal cavity size was normal in size. There is severe asymmetric left ventricular hypertrophy of the basal-septal segment. Left ventricular diastolic parameters are indeterminate.  LV Wall Scoring: The inferior wall is hypokinetic. Right Ventricle: The right ventricular size is normal. No increase in right ventricular wall thickness. Right ventricular systolic function is normal. Left Atrium: Left atrial size was severely dilated. Right Atrium: Right atrial size was severely dilated. Pericardium: There is no evidence of pericardial effusion. Mitral Valve: The mitral valve is degenerative in appearance. Severe mitral annular calcification. Mild to moderate mitral valve regurgitation. Mild mitral valve stenosis. MV peak gradient, 11.1 mmHg. The mean mitral valve gradient is 6.0 mmHg with average heart rate of 70 bpm. Tricuspid Valve: The tricuspid valve is normal in  structure. Tricuspid valve regurgitation is not demonstrated. No evidence of tricuspid stenosis. Aortic Valve: The aortic valve has been repaired/replaced. Aortic valve regurgitation is not visualized. Aortic valve mean gradient measures 13.0 mmHg. Aortic valve peak gradient measures 24.8 mmHg. Echo findings are  consistent with normal structure and function of the aortic valve prosthesis. Pulmonic Valve: The pulmonic valve was not well visualized. Pulmonic valve regurgitation is not visualized. Aorta: The aortic root and ascending aorta are structurally normal, with no evidence of dilitation. Venous: The inferior vena cava is normal in size with greater than 50% respiratory variability, suggesting right atrial pressure of 3 mmHg. IAS/Shunts: Evidence of atrial level shunting detected by color flow Doppler. Agitated saline contrast was given intravenously to evaluate for intracardiac shunting. Agitated saline contrast bubble study was negative, with no evidence of any interatrial shunt.  LEFT VENTRICLE PLAX 2D LVIDd:         3.90 cm LVIDs:         2.90 cm LV PW:         1.50 cm LV IVS:        1.40 cm  RIGHT VENTRICLE RV S prime:     14.40 cm/s TAPSE (M-mode): 1.7 cm LEFT ATRIUM            Index        RIGHT ATRIUM           Index LA diam:      6.20 cm  2.97 cm/m   RA Area:     28.00 cm LA Vol (A4C): 132.0 ml 63.22 ml/m  RA Volume:   87.90 ml  42.10 ml/m  AORTIC VALVE AV Vmax:           249.00 cm/s AV Vmean:          165.000 cm/s AV VTI:            0.469 m AV Peak Grad:      24.8 mmHg AV Mean Grad:      13.0 mmHg LVOT Vmax:         108.00 cm/s LVOT Vmean:        68.700 cm/s LVOT VTI:          0.197 m LVOT/AV VTI ratio: 0.42  AORTA Ao Asc diam: 3.80 cm MITRAL VALVE MV Area (PHT): 2.66 cm     SHUNTS MV Peak grad:  11.1 mmHg    Systemic VTI: 0.20 m MV Mean grad:  6.0 mmHg MV Vmax:       1.66 m/s MV Vmean:      114.3 cm/s MV Decel Time: 285 msec MV E velocity: 170.00 cm/s MV A velocity: 79.70 cm/s MV E/A ratio:  2.13  Riley Lam MD Electronically signed by Riley Lam MD Signature Date/Time: 01/02/2023/12:32:59 PM    Final    MR BRAIN WO CONTRAST  Result Date: 01/02/2023 CLINICAL DATA:  Slurred speech with right-sided weakness and facial droop EXAM: MRI HEAD WITHOUT CONTRAST TECHNIQUE: Multiplanar, multiecho pulse sequences of the brain and surrounding structures were obtained without intravenous contrast. COMPARISON:  Head CT and CTA from earlier today FINDINGS: Brain: Restricted diffusion in the left thalamus. Punctate restricted diffusion at the left occipital cortex. Mild chronic small vessel ischemia in the cerebral white matter for age. No acute hemorrhage, hydrocephalus, mass, or collection. Few chronic microhemorrhages are seen in the left cerebral hemisphere with nonspecific pattern. Mild, generalized cerebral volume loss. Vascular: Major flow voids are preserved.  There was preceding CTA. Skull and upper cervical spine: No focal marrow lesion Sinuses/Orbits: Negative IMPRESSION: Acute lacunar infarct at the left thalamus. Tiny acute left occipital cortex infarct. Electronically Signed   By: Tiburcio Pea M.D.   On: 01/02/2023 05:45   DG Chest Portable 1 View  Result Date: 01/02/2023 CLINICAL DATA:  Right leg weakness, possible stroke EXAM: PORTABLE CHEST 1 VIEW COMPARISON:  11/03/2022 CT FINDINGS: Cardiac shadow is enlarged. Changes of prior TAVR are noted. Postsurgical changes are seen. The lungs are well aerated bilaterally. No focal infiltrate is seen. No bony abnormality is noted. IMPRESSION: No acute abnormality noted. Electronically Signed   By: Alcide Clever M.D.   On: 01/02/2023 02:49   CT ANGIO HEAD NECK W WO CM (CODE STROKE)  Result Date: 01/02/2023 CLINICAL DATA:  Initial evaluation for neuro deficit, stroke. EXAM: CT ANGIOGRAPHY HEAD AND NECK WITH AND WITHOUT CONTRAST TECHNIQUE: Multidetector CT imaging of the head and neck was performed using the standard protocol during bolus  administration of intravenous contrast. Multiplanar CT image reconstructions and MIPs were obtained to evaluate the vascular anatomy. Carotid stenosis measurements (when applicable) are obtained utilizing NASCET criteria, using the distal internal carotid diameter as the denominator. RADIATION DOSE REDUCTION: This exam was performed according to the departmental dose-optimization program which includes automated exposure control, adjustment of the mA and/or kV according to patient size and/or use of iterative reconstruction technique. CONTRAST:  75mL OMNIPAQUE IOHEXOL 350 MG/ML SOLN COMPARISON:  CT from earlier the same day. FINDINGS: CTA NECK FINDINGS Aortic arch: Visualized arch within normal limits for caliber. Moderate atheromatous change about the visualized arch. Origin of the great vessels incompletely visualized. No visible stenosis. Right carotid system: Right common and internal carotid arteries are patent without dissection. Moderate calcified plaque about the right carotid bulb without hemodynamically significant greater than 50% stenosis. Left carotid system: Left common and internal carotid arteries are patent without dissection. Moderate calcified plaque about the left carotid bulb with up to approximately 60% stenosis by NASCET criteria. Vertebral arteries: Both vertebral arteries arise from the subclavian arteries. No visible significant proximal subclavian artery stenosis. Vertebral arteries are patent without significant stenosis or dissection. Skeleton: No worrisome osseous lesions. Moderate to advanced spondylosis present at C5-6. Other neck: No other acute finding. Upper chest: No other acute finding. Review of the MIP images confirms the above findings CTA HEAD FINDINGS Anterior circulation: Atheromatous change seen about the carotid siphons with associated up to moderate stenoses bilaterally. A1 segments patent bilaterally. Normal anterior communicating complex. Anterior cerebral arteries  patent without stenosis. No M1 stenosis or occlusion. No proximal ECA branch occlusion or high-grade stenosis. Distal MCA branches perfused and symmetric. Posterior circulation: Both V4 segments widely patent. Right vertebral artery slightly dominant. Both PICA patent. Basilar widely patent without stenosis. Superior cerebellar and posterior cerebral arteries patent bilaterally. Venous sinuses: Patent allowing for timing the contrast bolus. Anatomic variants: As above.  No aneurysm. Review of the MIP images confirms the above findings IMPRESSION: 1. Negative CTA for large vessel occlusion or other emergent finding. 2. Moderate calcified plaque about the carotid bifurcations bilaterally. Resultant 60% stenosis on the left and 40% stenosis on the right. 3. Atheromatous change about the carotid siphons with up to moderate stenoses bilaterally. Aortic Atherosclerosis (ICD10-I70.0). These results were communicated to Dr. Derry Lory at 1:30 am on 01/02/2023 by text page via the [facility] messaging system. Electronically Signed   By: Rise Mu M.D.   On: 01/02/2023 01:52   CT HEAD CODE STROKE WO CONTRAST  Result Date: 01/02/2023 CLINICAL DATA:  Code stroke. Initial evaluation for neuro deficit, stroke. EXAM: CT HEAD WITHOUT CONTRAST TECHNIQUE: Contiguous axial images were obtained from the base of the skull through the vertex without intravenous contrast. RADIATION DOSE REDUCTION: This exam was performed according to the  departmental dose-optimization program which includes automated exposure control, adjustment of the mA and/or kV according to patient size and/or use of iterative reconstruction technique. COMPARISON:  None Available. FINDINGS: Brain: Mild age-related cerebral atrophy. No acute intracranial hemorrhage. No acute large vessel territory infarct. No mass lesion, midline shift or mass effect. No hydrocephalus or extra-axial fluid collection. Vascular: No abnormal hyperdense vessel. Scattered calcified  atherosclerosis of skull base. Skull: Scalp soft tissues and calvarium within normal limits. Sinuses/Orbits: Globes and orbital soft tissues within normal limits. Paranasal sinuses and mastoid air cells are clear. Other: None. ASPECTS [facility] Stroke Program Early CT Score) - Ganglionic level infarction (caudate, lentiform nuclei, internal capsule, insula, M1-M3 cortex): 7 - Supraganglionic infarction (M4-M6 cortex): 3 Total score (0-10 with 10 being normal): 10 IMPRESSION: 1. No acute intracranial abnormality. 2. ASPECTS is 10. These results were communicated to Dr. Derry Lory at 1:30 am on 01/02/2023 by text page via the [facility] messaging system. Electronically Signed   By: Rise Mu M.D.   On: 01/02/2023 01:30    Assessment/Plan: Diagnosis: Acute ischemic infarct left thalamus lacunar and tiny acute left occipital cortex infarct.  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 Yes Co-Morbidities requiring supervision/potential complications:  -Atrial fibrillation, hypertension, hyperlipidemia, diabetes mellitus type 2, coronary artery disease, obesity, CLL, history of prostate cancer, OSA, COPD, history of aortic stenosis status post TAVR Due to bladder management, bowel management, safety, skin/wound care, disease management, medication administration, pain management, and patient education, does the patient require 24 hr/day rehab nursing? Yes Does the patient require coordinated care of a physician, rehab nurse, therapy disciplines of physical therapy and Occupational Therapy to address physical and functional deficits in the context of the above medical diagnosis(es)? Yes Addressing deficits in the following areas: balance, endurance, locomotion, strength, transferring, bowel/bladder  control, bathing, dressing, feeding, grooming, toileting, and psychosocial support Can the patient actively participate in an intensive therapy program of at least 3 hrs of therapy per day at least 5 days per week? Yes The potential for patient to make measurable gains while on inpatient rehab is excellent Anticipated functional outcomes upon discharge from inpatient rehab are modified independent  with PT, modified independent with OT, n/a with SLP. Estimated rehab length of stay to reach the above functional goals is: 5 to 7 days Anticipated discharge destination: Home Overall Rehab/Functional Prognosis: excellent  POST ACUTE RECOMMENDATIONS: This patient's condition is appropriate for continued rehabilitative care in the following setting: CIR Patient has agreed to participate in recommended program. Yes Note that insurance prior authorization may be required for reimbursement for recommended care.  Comment: Patient may be a good candidate for CIR pending reevaluation by physical therapy and Occupational Therapy for assessment of deficits.  Patient has limited assistance at home, likely goal would be mod I for discharge.   I have personally performed a face to face diagnostic evaluation of this patient. Additionally, I have examined the patient's medical record including any pertinent labs and radiographic images. If the physician assistant has documented in this note, I have reviewed and edited or otherwise concur with the physician assistant's documentation.  Thanks,  Fanny Dance, MD 01/03/2023

## 2023-01-04 ENCOUNTER — Telehealth: Payer: Self-pay | Admitting: Cardiovascular Disease

## 2023-01-04 DIAGNOSIS — I639 Cerebral infarction, unspecified: Secondary | ICD-10-CM | POA: Diagnosis not present

## 2023-01-04 LAB — VITAMIN D 25 HYDROXY (VIT D DEFICIENCY, FRACTURES): Vit D, 25-Hydroxy: 81.29 ng/mL (ref 30–100)

## 2023-01-04 LAB — GLUCOSE, CAPILLARY
Glucose-Capillary: 107 mg/dL — ABNORMAL HIGH (ref 70–99)
Glucose-Capillary: 112 mg/dL — ABNORMAL HIGH (ref 70–99)
Glucose-Capillary: 103 mg/dL — ABNORMAL HIGH (ref 70–99)
Glucose-Capillary: 182 mg/dL — ABNORMAL HIGH (ref 70–99)

## 2023-01-04 LAB — PROTIME-INR
INR: 2.1 — ABNORMAL HIGH (ref 0.8–1.2)
Prothrombin Time: 24.1 s — ABNORMAL HIGH (ref 11.4–15.2)

## 2023-01-04 NOTE — Telephone Encounter (Signed)
Patient called to let Dr. Royann Shivers know he had a stroke and is at [facility], [address], Rm# 07.  Patient stated he will be moving to Rehab tonight or tomorrow to 7th Floor.

## 2023-01-04 NOTE — Plan of Care (Signed)
  Problem: Education: Goal: Knowledge of disease or condition will improve Outcome: Progressing   Problem: Education: Goal: Knowledge of patient specific risk factors will improve Loraine Leriche N/A or DELETE if not current risk factor) Outcome: Progressing   Problem: Education: Goal: Knowledge of secondary prevention will improve (MUST DOCUMENT ALL) Outcome: Progressing   Problem: Ischemic Stroke/TIA Tissue Perfusion: Goal: Complications of ischemic stroke/TIA will be minimized Outcome: Progressing   Problem: Coping: Goal: Will verbalize positive feelings about self Outcome: Progressing   Problem: Coping: Goal: Will identify appropriate support needs Outcome: Progressing   Problem: Self-Care: Goal: Ability to communicate needs accurately will improve Outcome: Progressing   Problem: Self-Care: Goal: Ability to participate in self-care as condition permits will improve Outcome: Progressing   Problem: Coping: Goal: Ability to adjust to condition or change in health will improve Outcome: Progressing   Problem: Education: Goal: Knowledge of General Education information will improve Description: Including pain rating scale, medication(s)/side effects and non-pharmacologic comfort measures Outcome: Progressing   Problem: Coping: Goal: Level of anxiety will decrease Outcome: Progressing   Problem: Safety: Goal: Ability to remain free from injury will improve Outcome: Progressing

## 2023-01-04 NOTE — Progress Notes (Signed)
PROGRESS NOTE  Rodney Friedman ZOX:[id number] DOB: 1946-01-07   PCP: Ivonne [name], NP  Patient is from: Home.  Lives alone.  DOA: 01/02/2023 LOS: 1  Chief complaints Chief Complaint  Patient presents with   Code Stroke     Brief Narrative / Interim history: 77 year old M with PMH of A-fib on warfarin, CAD/CABG, CLL, prostate cancer, HTN, COPD, OSA, DM-2, AS s/p TAVR and HLD presenting with right-sided weakness and subsequent fall, and found to have acute lacunar infarct of left thalamus and left occipital cortex.  Patient was outside tPA window on arrival to ED.  Reportedly struggling with his INR on warfarin.  Neurology consulted.  Changed warfarin to Eliquis.  CT angio with 60% stenosis in left carotid bifurcation and 40% stenosis in right carotid bifurcation.  TTE with LVEF of 55 to 60% and severe LVH.  LDL 28.  A1c pending.  Neurology recommended Eliquis and signed off.  Symptoms improved.  Therapy recommended CIR.  CIR following.  Subjective: Seen and examined earlier this morning.  No major events overnight of this morning.  No complaints.  Reports improvement in right arm and leg weakness.  Reports "bruising" in left eye.  There is no obvious bruising or significant tenderness on exam.  Objective: Vitals:   01/04/23 0043 01/04/23 0444 01/04/23 0746 01/04/23 1127  BP: (!) 117/93 (!) 132/91 136/80 (!) 152/89  Pulse: 91 65 71 90  Resp: (!) 22  16 17   Temp: 97.7 F (36.5 C) 98.2 F (36.8 C) 98.3 F (36.8 C) 97.8 F (36.6 C)  TempSrc: Axillary Axillary Oral Oral  SpO2: 98% 98% 98% 100%  Weight:      Height:        Examination:  GENERAL: No apparent distress.  Nontoxic. HEENT: MMM.  Vision and hearing grossly intact.  NECK: Supple.  No apparent JVD.  RESP:  No IWOB.  Fair aeration bilaterally. CVS: Irregular rhythm.  Normal rate.  Heart sounds normal.  ABD/GI/GU: BS+. Abd soft, NTND.  MSK/EXT:  Moves extremities. No apparent deformity. No edema.  SKIN: Scabs on his  knees from fall. NEURO: Awake and alert.  Oriented appropriately.  No focal neurodeficit other than subtle right pronator drift. PSYCH: Calm. Normal affect.   Procedures:  None  Microbiology summarized: None  Assessment and plan: Principal Problem:   Acute ischemic stroke [facility]) Active Problems:   Acute CVA (cerebrovascular accident) (HCC)  Acute ischemic stroke: Presented with right-sided weakness.  MRI showed acute lacunar infarct of left thalamus and left occipital cortex.  Patient was outside tPA window on arrival to ED.  Reportedly struggling with his INR on warfarin.  INR was 2.4 on admission but 1.7 on 8/9).  CT angio with 60% stenosis in left carotid bifurcation and 40% stenosis in right carotid bifurcation.  TTE with LVEF of 55 to 60% and severe LVH.  LDL 28.  A1c 6.8%.  Symptoms improving. -Neurology recommended Eliquis and signed off.   -Therapy recommended CIR.  CIR following. -Continue home Crestor and Zetia. -Normalize blood pressure   Controlled NIDDM-2: A1c 6.8%.  CBG within acceptable range. Recent Labs  Lab 01/03/23 1138 01/03/23 1541 01/03/23 2119 01/04/23 0620 01/04/23 1159  GLUCAP 167* 110* 179* 107* 112*  -Continue SSI  Hypertension: BP elevated. -Continue home Toprol-XL.   Chronic atrial fibrillation: Rate controlled -Continue home Toprol-XL. -Changed warfarin to Eliquis due to labile INR and stroke  Bilateral carotid artery stenosis: CT angio head and neck as above. -Continue Crestor and Zetia as above -  Continue Eliquis   History of CAD: No cardiopulmonary symptoms -Continue home meds  History of severe AS status post TAVR: Stable -Outpatient follow-up   CLL/history of prostate cancer: Follows urology and Dr. Bertis Ruddy -On yearly surveillance. -Outpatient follow-up    OSA not on CPAP -Monitor for desaturation.   Chronic COPD: Stable.  Quit smoking about 2 years ago. -Continue bronchodilators  Vitamin D deficiency: Seems she is taking  high-dose vitamin D biweekly. -Check vitamin D level -Hold vitamin D pending level.  Obesity Body mass index is 30.3 kg/m.         DVT prophylaxis:   apixaban (ELIQUIS) tablet 5 mg  Code Status: Full code Family Communication: None at bedside Level of care: Telemetry Medical Status is: Inpatient Remains inpatient appropriate because: Safe disposition/CIR   Final disposition: CIR Consultants:  Neurology  35 minutes with more than 50% spent in reviewing records, counseling patient/family and coordinating care.   Sch Meds:  Scheduled Meds:  apixaban  5 mg Oral BID   buPROPion ER  100 mg Oral QHS   buPROPion  200 mg Oral q morning   cyanocobalamin  500 mcg Oral QODAY   ezetimibe  10 mg Oral Daily   gabapentin  300 mg Oral QHS   insulin aspart  0-5 Units Subcutaneous QHS   insulin aspart  0-9 Units Subcutaneous TID WC   metoprolol succinate  50 mg Oral BID WC   rosuvastatin  20 mg Oral Daily   sodium chloride flush  3 mL Intravenous Once   Continuous Infusions: PRN Meds:.acetaminophen **OR** acetaminophen (TYLENOL) oral liquid 160 mg/5 mL **OR** acetaminophen, albuterol, metoprolol tartrate  Antimicrobials: Anti-infectives (From admission, onward)    None        I have personally reviewed the following labs and images: CBC: Recent Labs  Lab 01/02/23 0115 01/02/23 0120 01/03/23 0321  WBC 14.2*  --  13.4*  NEUTROABS 7.1  --   --   HGB 14.9 15.0 14.2  HCT 44.1 44.0 41.5  MCV 89.3  --  89.6  PLT 175  --  152   BMP &GFR Recent Labs  Lab 01/02/23 0115 01/02/23 0120 01/03/23 0321  NA 139 142 140  K 3.9 4.0 4.3  CL 107 108 107  CO2 19*  --  22  GLUCOSE 127* 122* 92  BUN 13 15 16   CREATININE 1.37* 1.30* 1.42*  CALCIUM 9.4  --  9.1  MG  --   --  1.7  PHOS  --   --  4.1   Estimated Creatinine Clearance: 49.1 mL/min (A) (by C-G formula based on SCr of 1.42 mg/dL (H)). Liver & Pancreas: Recent Labs  Lab 01/02/23 0115 01/03/23 0321  AST 31  --    ALT 25  --   ALKPHOS 35*  --   BILITOT 0.9  --   PROT 6.6  --   ALBUMIN 3.8 3.5   No results for input(s): "LIPASE", "AMYLASE" in the last 168 hours. No results for input(s): "AMMONIA" in the last 168 hours. Diabetic: Recent Labs    01/03/23 0321  HGBA1C 6.8*   Recent Labs  Lab 01/03/23 1138 01/03/23 1541 01/03/23 2119 01/04/23 0620 01/04/23 1159  GLUCAP 167* 110* 179* 107* 112*   Cardiac Enzymes: No results for input(s): "CKTOTAL", "CKMB", "CKMBINDEX", "TROPONINI" in the last 168 hours. No results for input(s): "PROBNP" in the last 8760 hours. Coagulation Profile: Recent Labs  Lab 12/29/22 0844 01/02/23 0115 01/03/23 0321 01/04/23 0013  INR 1.7* 2.4*  2.9* 2.1*   Thyroid Function Tests: No results for input(s): "TSH", "T4TOTAL", "FREET4", "T3FREE", "THYROIDAB" in the last 72 hours. Lipid Profile: Recent Labs    01/03/23 0321  CHOL 88  HDL 32*  LDLCALC 28  TRIG 010  CHOLHDL 2.8   Anemia Panel: No results for input(s): "VITAMINB12", "FOLATE", "FERRITIN", "TIBC", "IRON", "RETICCTPCT" in the last 72 hours. Urine analysis:    Component Value Date/Time   COLORURINE YELLOW 01/02/2023 0748   APPEARANCEUR CLEAR 01/02/2023 0748   APPEARANCEUR Clear 02/09/2017 1238   LABSPEC 1.043 (H) 01/02/2023 0748   PHURINE 5.0 01/02/2023 0748   GLUCOSEU NEGATIVE 01/02/2023 0748   HGBUR NEGATIVE 01/02/2023 0748   BILIRUBINUR NEGATIVE 01/02/2023 0748   BILIRUBINUR Negative 02/09/2017 1238   KETONESUR NEGATIVE 01/02/2023 0748   PROTEINUR NEGATIVE 01/02/2023 0748   UROBILINOGEN 0.2 06/04/2014 1539   NITRITE NEGATIVE 01/02/2023 0748   LEUKOCYTESUR NEGATIVE 01/02/2023 0748   Sepsis Labs: Invalid input(s): "PROCALCITONIN", "LACTICIDVEN"  Microbiology: No results found for this or any previous visit (from the past 240 hour(s)).  Radiology Studies: No results found.     T.  [facility]  If 7PM-7AM, please contact  night-coverage [email or web address] 01/04/2023, 2:07 PM

## 2023-01-04 NOTE — Plan of Care (Signed)
  Problem: Education: Goal: Knowledge of patient specific risk factors will improve Rodney Friedman N/A or DELETE if not current risk factor) Outcome: Progressing   Problem: Ischemic Stroke/TIA Tissue Perfusion: Goal: Complications of ischemic stroke/TIA will be minimized Outcome: Progressing

## 2023-01-04 NOTE — Progress Notes (Signed)
IP rehab admissions - We do not have a rehab bed available for this patient today.  My partner will follow up tomorrow for bed availability.  Call for questions.  [phone]

## 2023-01-04 NOTE — Progress Notes (Addendum)
Physical Therapy Treatment Patient Details Name: Rodney Friedman MRN: [id number] DOB: [birth year] Today's Date: 01/04/2023   History of Present Illness Pt is 77 yo male admitted on 01/02/23 with CVA - acute lacunar infarct of L thalamus and tiny acute left occipital cortex infarct.  Pt with hx including but not limited to CLL, prostate cancer, CAD, diabetes mellitus on metformin, chronic atrial fibrillation on warfarin, history of severe aortic stenosis status post TAVR, dyslipidemia and sleep apnea.    PT Comments  Further training emphasizing transfers and gait. Performed transfer from multiple surfaces today with CGA for safety, cues for rail use in restroom and walker set-up/placement with hygiene tasks at sink and commode. CGA to ambulate with RW - needs reminded to use RW at all times unless therapist assisting without AD. Up to min assist for gait without RW. One episode of posterior LOB while backing up to recliner needing corrective assistance. Patient will continue to benefit from skilled physical therapy services to further improve independence with functional mobility. Patient will benefit from intensive inpatient follow up therapy, >3 hours/day.    If plan is discharge home, recommend the following: A little help with walking and/or transfers;A little help with bathing/dressing/bathroom;Assistance with cooking/housework;Help with stairs or ramp for entrance   Can travel by private vehicle        Equipment Recommendations  None recommended by PT    Recommendations for Other Services Rehab consult     Precautions / Restrictions Precautions Precautions: Fall Precaution Comments: RUE and LE ataxia, Lt knee instability Restrictions Weight Bearing Restrictions: No     Mobility  Bed Mobility               General bed mobility comments: Sitting EOB    Transfers Overall transfer level: Needs assistance Equipment used: Rolling walker (2 wheels) Transfers: Sit to/from  Stand Sit to Stand: Contact guard assist           General transfer comment: CGA for safety, improved power up today, performed from bed x2, and standard chair with arm rests. X1 from toilet with rail use. Able to raise/lower brief standing in front of toilet, using back of knees to brace himself, CGA. Pt requires VC for walker placement prior to sitting and for reaching back, shows some instability.    Ambulation/Gait Ambulation/Gait assistance: Min assist Gait Distance (Feet): 100 Feet Assistive device: Rolling walker (2 wheels), None, 1 person hand held assist Gait Pattern/deviations: Decreased stride length, Step-through pattern, Knees buckling, Knee hyperextension - left, Shuffle, Ataxic, Trunk flexed, Staggering right, Festinating Gait velocity: decreased Gait velocity interpretation: <1.31 ft/sec, indicative of household ambulator   General Gait Details: CGA with RW for support, vc for upright posture and to keep RW within BOS. Min assist without AD, reaching for wall, staggering slightly towards Rt, increased shuffle and a little festinating. Cues for safety and awareness throughout. One epidose of posterior LOB while backing up to chair, needed min assist to correct.   Stairs             Wheelchair Mobility     Tilt Bed    Modified Rankin (Stroke Patients Only) Modified Rankin (Stroke Patients Only) Pre-Morbid Rankin Score: No symptoms Modified Rankin: Moderately severe disability     Balance Overall balance assessment: Needs assistance Sitting-balance support: No upper extremity supported Sitting balance-Leahy Scale: Good     Standing balance support: Reliant on assistive device for balance, No upper extremity supported Standing balance-Leahy Scale: Fair Standing balance comment: Stands without UE  support, safer with BIL UE on walker.                            Cognition Arousal: Alert Behavior During Therapy: WFL for tasks  assessed/performed Overall Cognitive Status: Within Functional Limits for tasks assessed                                 General Comments: Impulsive at times. Aware of deficits but seems to have reduced awareness of limits and safety.        Exercises      General Comments        Pertinent Vitals/Pain Pain Assessment Pain Assessment: Faces Faces Pain Scale: Hurts little more Pain Location: back, chronic Pain Descriptors / Indicators: Aching Pain Intervention(s): Monitored during session, Repositioned    Home Living                          Prior Function            PT Goals (current goals can now be found in the care plan section) Acute Rehab PT Goals Patient Stated Goal: return home PT Goal Formulation: With patient Time For Goal Achievement: 01/16/23 Potential to Achieve Goals: Good Progress towards PT goals: Progressing toward goals    Frequency    Min 1X/week      PT Plan      Co-evaluation              AM-PAC PT "6 Clicks" Mobility   Outcome Measure  Help needed turning from your back to your side while in a flat bed without using bedrails?: A Little Help needed moving from lying on your back to sitting on the side of a flat bed without using bedrails?: A Little Help needed moving to and from a bed to a chair (including a wheelchair)?: A Little Help needed standing up from a chair using your arms (e.g., wheelchair or bedside chair)?: A Little Help needed to walk in hospital room?: A Little Help needed climbing 3-5 steps with a railing? : A Lot 6 Click Score: 17    End of Session Equipment Utilized During Treatment: Gait belt Activity Tolerance: Patient tolerated treatment well Patient left: in chair;with call bell/phone within reach;with chair alarm set Nurse Communication: Mobility status PT Visit Diagnosis: Other abnormalities of gait and mobility (R26.89);Hemiplegia and hemiparesis;Ataxic gait (R26.0) Hemiplegia  - Right/Left: Right Hemiplegia - dominant/non-dominant: Dominant Hemiplegia - caused by: Cerebral infarction     Time: 1021-1044 PT Time Calculation (min) (ACUTE ONLY): 23 min  Charges:    $Gait Training: 8-22 mins $Therapeutic Activity: 8-22 mins PT General Charges $$ ACUTE PT VISIT: 1 Visit                     Kathlyn [place], PT, DPT [facility]  Rehabilitation Services Physical Therapist Office: [phone] Website: [email or web address]    Berton Mount 01/04/2023, 12:19 PM

## 2023-01-05 ENCOUNTER — Other Ambulatory Visit: Payer: Self-pay

## 2023-01-05 ENCOUNTER — Encounter (HOSPITAL_COMMUNITY): Payer: Self-pay | Admitting: Physical Medicine & Rehabilitation

## 2023-01-05 ENCOUNTER — Inpatient Hospital Stay (HOSPITAL_COMMUNITY)
Admission: RE | Admit: 2023-01-05 | Discharge: 2023-01-13 | DRG: 057 | Disposition: A | Discharge status: Home Health | Payer: Medicare Other | Source: Intra-hospital | Attending: Physical Medicine & Rehabilitation | Admitting: Physical Medicine & Rehabilitation

## 2023-01-05 ENCOUNTER — Telehealth: Payer: Self-pay | Admitting: Nurse Practitioner

## 2023-01-05 DIAGNOSIS — I69398 Other sequelae of cerebral infarction: Secondary | ICD-10-CM

## 2023-01-05 DIAGNOSIS — I77819 Aortic ectasia, unspecified site: Secondary | ICD-10-CM | POA: Diagnosis present

## 2023-01-05 DIAGNOSIS — Z951 Presence of aortocoronary bypass graft: Secondary | ICD-10-CM | POA: Diagnosis not present

## 2023-01-05 DIAGNOSIS — H409 Unspecified glaucoma: Secondary | ICD-10-CM | POA: Diagnosis present

## 2023-01-05 DIAGNOSIS — Z856 Personal history of leukemia: Secondary | ICD-10-CM

## 2023-01-05 DIAGNOSIS — E785 Hyperlipidemia, unspecified: Secondary | ICD-10-CM | POA: Diagnosis present

## 2023-01-05 DIAGNOSIS — D72829 Elevated white blood cell count, unspecified: Secondary | ICD-10-CM | POA: Diagnosis present

## 2023-01-05 DIAGNOSIS — I69351 Hemiplegia and hemiparesis following cerebral infarction affecting right dominant side: Principal | ICD-10-CM

## 2023-01-05 DIAGNOSIS — Z952 Presence of prosthetic heart valve: Secondary | ICD-10-CM

## 2023-01-05 DIAGNOSIS — Z8049 Family history of malignant neoplasm of other genital organs: Secondary | ICD-10-CM

## 2023-01-05 DIAGNOSIS — I482 Chronic atrial fibrillation, unspecified: Secondary | ICD-10-CM

## 2023-01-05 DIAGNOSIS — I252 Old myocardial infarction: Secondary | ICD-10-CM | POA: Diagnosis not present

## 2023-01-05 DIAGNOSIS — R278 Other lack of coordination: Secondary | ICD-10-CM | POA: Diagnosis present

## 2023-01-05 DIAGNOSIS — I69392 Facial weakness following cerebral infarction: Secondary | ICD-10-CM | POA: Diagnosis not present

## 2023-01-05 DIAGNOSIS — I4891 Unspecified atrial fibrillation: Secondary | ICD-10-CM | POA: Diagnosis present

## 2023-01-05 DIAGNOSIS — Z961 Presence of intraocular lens: Secondary | ICD-10-CM | POA: Diagnosis present

## 2023-01-05 DIAGNOSIS — Z8042 Family history of malignant neoplasm of prostate: Secondary | ICD-10-CM

## 2023-01-05 DIAGNOSIS — Z8041 Family history of malignant neoplasm of ovary: Secondary | ICD-10-CM

## 2023-01-05 DIAGNOSIS — Z8546 Personal history of malignant neoplasm of prostate: Secondary | ICD-10-CM

## 2023-01-05 DIAGNOSIS — I10 Essential (primary) hypertension: Secondary | ICD-10-CM | POA: Diagnosis not present

## 2023-01-05 DIAGNOSIS — Z7901 Long term (current) use of anticoagulants: Secondary | ICD-10-CM | POA: Diagnosis not present

## 2023-01-05 DIAGNOSIS — M810 Age-related osteoporosis without current pathological fracture: Secondary | ICD-10-CM | POA: Diagnosis present

## 2023-01-05 DIAGNOSIS — M79652 Pain in left thigh: Secondary | ICD-10-CM | POA: Diagnosis not present

## 2023-01-05 DIAGNOSIS — Z8249 Family history of ischemic heart disease and other diseases of the circulatory system: Secondary | ICD-10-CM

## 2023-01-05 DIAGNOSIS — Z9842 Cataract extraction status, left eye: Secondary | ICD-10-CM | POA: Diagnosis not present

## 2023-01-05 DIAGNOSIS — Z87891 Personal history of nicotine dependence: Secondary | ICD-10-CM

## 2023-01-05 DIAGNOSIS — Z79899 Other long term (current) drug therapy: Secondary | ICD-10-CM

## 2023-01-05 DIAGNOSIS — I2510 Atherosclerotic heart disease of native coronary artery without angina pectoris: Secondary | ICD-10-CM | POA: Diagnosis present

## 2023-01-05 DIAGNOSIS — Z86010 Personal history of colonic polyps: Secondary | ICD-10-CM

## 2023-01-05 DIAGNOSIS — E119 Type 2 diabetes mellitus without complications: Secondary | ICD-10-CM | POA: Diagnosis present

## 2023-01-05 DIAGNOSIS — Z9841 Cataract extraction status, right eye: Secondary | ICD-10-CM

## 2023-01-05 DIAGNOSIS — M79651 Pain in right thigh: Secondary | ICD-10-CM | POA: Diagnosis present

## 2023-01-05 DIAGNOSIS — I6381 Other cerebral infarction due to occlusion or stenosis of small artery: Secondary | ICD-10-CM

## 2023-01-05 DIAGNOSIS — Z794 Long term (current) use of insulin: Secondary | ICD-10-CM | POA: Diagnosis not present

## 2023-01-05 DIAGNOSIS — E669 Obesity, unspecified: Secondary | ICD-10-CM | POA: Diagnosis present

## 2023-01-05 DIAGNOSIS — G4730 Sleep apnea, unspecified: Secondary | ICD-10-CM | POA: Diagnosis present

## 2023-01-05 DIAGNOSIS — Z6831 Body mass index (BMI) 31.0-31.9, adult: Secondary | ICD-10-CM

## 2023-01-05 DIAGNOSIS — F32A Depression, unspecified: Secondary | ICD-10-CM | POA: Diagnosis present

## 2023-01-05 DIAGNOSIS — F419 Anxiety disorder, unspecified: Secondary | ICD-10-CM | POA: Diagnosis present

## 2023-01-05 DIAGNOSIS — I639 Cerebral infarction, unspecified: Secondary | ICD-10-CM | POA: Diagnosis not present

## 2023-01-05 DIAGNOSIS — Z800 Family history of malignant neoplasm of digestive organs: Secondary | ICD-10-CM

## 2023-01-05 DIAGNOSIS — R682 Dry mouth, unspecified: Secondary | ICD-10-CM | POA: Diagnosis present

## 2023-01-05 DIAGNOSIS — Z7984 Long term (current) use of oral hypoglycemic drugs: Secondary | ICD-10-CM

## 2023-01-05 DIAGNOSIS — M818 Other osteoporosis without current pathological fracture: Secondary | ICD-10-CM | POA: Diagnosis present

## 2023-01-05 LAB — CBC
HCT: 43.2 % (ref 39.0–52.0)
Hemoglobin: 15.2 g/dL (ref 13.0–17.0)
MCH: 31.9 pg (ref 26.0–34.0)
MCHC: 35.2 g/dL (ref 30.0–36.0)
MCV: 90.6 fL (ref 80.0–100.0)
Platelets: 122 10*3/uL — ABNORMAL LOW (ref 150–400)
RBC: 4.77 MIL/uL (ref 4.22–5.81)
RDW: 13.6 % (ref 11.5–15.5)
WBC: 13 10*3/uL — ABNORMAL HIGH (ref 4.0–10.5)
nRBC: 0 % (ref 0.0–0.2)

## 2023-01-05 LAB — RENAL FUNCTION PANEL
Albumin: 3.3 g/dL — ABNORMAL LOW (ref 3.5–5.0)
Anion gap: 11 (ref 5–15)
BUN: 18 mg/dL (ref 8–23)
CO2: 19 mmol/L — ABNORMAL LOW (ref 22–32)
Calcium: 8.9 mg/dL (ref 8.9–10.3)
Chloride: 109 mmol/L (ref 98–111)
Creatinine, Ser: 1.03 mg/dL (ref 0.61–1.24)
GFR, Estimated: >60 mL/min (ref >60)
Glucose, Bld: 92 mg/dL (ref 70–99)
Phosphorus: 3.6 mg/dL (ref 2.5–4.6)
Potassium: 4 mmol/L (ref 3.5–5.1)
Sodium: 139 mmol/L (ref 135–145)

## 2023-01-05 LAB — GLUCOSE, CAPILLARY
Glucose-Capillary: 108 mg/dL — ABNORMAL HIGH (ref 70–99)
Glucose-Capillary: 170 mg/dL — ABNORMAL HIGH (ref 70–99)
Glucose-Capillary: 66 mg/dL — ABNORMAL LOW (ref 70–99)
Glucose-Capillary: 102 mg/dL — ABNORMAL HIGH (ref 70–99)

## 2023-01-05 LAB — MAGNESIUM: Magnesium: 1.6 mg/dL — ABNORMAL LOW (ref 1.7–2.4)

## 2023-01-05 MED ORDER — BISACODYL 5 MG PO TBEC: 5.0000 mg | DELAYED_RELEASE_TABLET | Freq: Every day | ORAL | Status: DC | PRN

## 2023-01-05 MED ORDER — ROSUVASTATIN CALCIUM 20 MG PO TABS
20.0000 mg | ORAL_TABLET | Freq: Every day | ORAL | Status: DC
  Administered 2023-01-06 – 2023-01-13 (×8): 20 mg via ORAL
  Filled 2023-01-05 (×8): qty 1

## 2023-01-05 MED ORDER — ACETAMINOPHEN 325 MG PO TABS: 650.0000 mg | ORAL_TABLET | ORAL | Status: DC | PRN

## 2023-01-05 MED ORDER — POLYETHYLENE GLYCOL 3350 17 G PO PACK
17.0000 g | PACK | Freq: Every day | ORAL | Status: DC | PRN
  Administered 2023-01-07: 17 g via ORAL
  Filled 2023-01-05: qty 1

## 2023-01-05 MED ORDER — CARVEDILOL 12.5 MG PO TABS: 12.5000 mg | ORAL_TABLET | Freq: Two times a day (BID) | ORAL | Status: DC

## 2023-01-05 MED ORDER — SENNOSIDES-DOCUSATE SODIUM 8.6-50 MG PO TABS
1.0000 | ORAL_TABLET | Freq: Once | ORAL | Status: AC
  Administered 2023-01-05: 1 via ORAL
  Filled 2023-01-05: qty 1

## 2023-01-05 MED ORDER — VITAMIN B-12 1000 MCG PO TABS
500.0000 ug | ORAL_TABLET | ORAL | Status: DC
  Administered 2023-01-09 – 2023-01-13 (×3): 500 ug via ORAL
  Filled 2023-01-05 (×3): qty 1

## 2023-01-05 MED ORDER — ALBUTEROL SULFATE HFA 108 (90 BASE) MCG/ACT IN AERS: 2.0000 | INHALATION_SPRAY | Freq: Four times a day (QID) | RESPIRATORY_TRACT | Status: DC | PRN

## 2023-01-05 MED ORDER — GUAIFENESIN-DM 100-10 MG/5ML PO SYRP: 10.0000 mL | ORAL_SOLUTION | Freq: Four times a day (QID) | ORAL | Status: DC | PRN

## 2023-01-05 MED ORDER — APIXABAN 5 MG PO TABS: 5.0000 mg | ORAL_TABLET | Freq: Two times a day (BID) | ORAL | Status: DC

## 2023-01-05 MED ORDER — ONDANSETRON HCL 4 MG PO TABS: 4.0000 mg | ORAL_TABLET | Freq: Four times a day (QID) | ORAL | Status: DC | PRN

## 2023-01-05 MED ORDER — METHOCARBAMOL 500 MG PO TABS: 500.0000 mg | ORAL_TABLET | Freq: Four times a day (QID) | ORAL | Status: DC | PRN

## 2023-01-05 MED ORDER — BUPROPION HCL ER (SR) 100 MG PO TB12
100.0000 mg | ORAL_TABLET | Freq: Every day | ORAL | Status: DC
  Administered 2023-01-05 – 2023-01-12 (×8): 100 mg via ORAL
  Filled 2023-01-05 (×9): qty 1

## 2023-01-05 MED ORDER — DOCUSATE SODIUM 100 MG PO CAPS
100.0000 mg | ORAL_CAPSULE | Freq: Two times a day (BID) | ORAL | Status: DC
  Administered 2023-01-05 – 2023-01-13 (×16): 100 mg via ORAL
  Filled 2023-01-05 (×16): qty 1

## 2023-01-05 MED ORDER — METOPROLOL SUCCINATE ER 50 MG PO TB24
50.0000 mg | ORAL_TABLET | Freq: Two times a day (BID) | ORAL | Status: DC
  Administered 2023-01-05 – 2023-01-13 (×16): 50 mg via ORAL
  Filled 2023-01-05 (×16): qty 1

## 2023-01-05 MED ORDER — FLEET ENEMA RE ENEM: 1.0000 | ENEMA | Freq: Once | RECTAL | Status: DC | PRN

## 2023-01-05 MED ORDER — GABAPENTIN 300 MG PO CAPS
300.0000 mg | ORAL_CAPSULE | Freq: Every day | ORAL | Status: DC
  Administered 2023-01-05 – 2023-01-12 (×8): 300 mg via ORAL
  Filled 2023-01-05 (×8): qty 1

## 2023-01-05 MED ORDER — POLYETHYLENE GLYCOL 3350 17 G PO PACK: 17.0000 g | PACK | Freq: Two times a day (BID) | ORAL | Status: DC | PRN

## 2023-01-05 MED ORDER — LATANOPROST 0.005 % OP SOLN
1.0000 [drp] | OPHTHALMIC | Status: DC
  Filled 2023-01-05: qty 2.5

## 2023-01-05 MED ORDER — EZETIMIBE 10 MG PO TABS
10.0000 mg | ORAL_TABLET | Freq: Every day | ORAL | Status: DC
  Administered 2023-01-06 – 2023-01-13 (×8): 10 mg via ORAL
  Filled 2023-01-05 (×8): qty 1

## 2023-01-05 MED ORDER — LOSARTAN POTASSIUM 25 MG PO TABS: 25.0000 mg | ORAL_TABLET | Freq: Every day | ORAL | Status: DC

## 2023-01-05 MED ORDER — LATANOPROST 0.005 % OP SOLN
1.0000 [drp] | OPHTHALMIC | Status: DC
  Administered 2023-01-05 – 2023-01-11 (×4): 1 [drp] via OPHTHALMIC
  Filled 2023-01-05: qty 2.5

## 2023-01-05 MED ORDER — INSULIN ASPART 100 UNIT/ML IJ SOLN: 0.0000 [IU] | Freq: Every day | INTRAMUSCULAR | Status: DC

## 2023-01-05 MED ORDER — TRAZODONE HCL 50 MG PO TABS: 25.0000 mg | ORAL_TABLET | Freq: Every evening | ORAL | Status: DC | PRN

## 2023-01-05 MED ORDER — BUPROPION HCL ER (SR) 100 MG PO TB12
200.0000 mg | ORAL_TABLET | Freq: Every morning | ORAL | Status: DC
  Administered 2023-01-06 – 2023-01-13 (×8): 200 mg via ORAL
  Filled 2023-01-05 (×8): qty 2

## 2023-01-05 MED ORDER — MULTI-VITAMIN/MINERALS PO TABS: 1.0000 | ORAL_TABLET | Freq: Every day | ORAL | Status: DC

## 2023-01-05 MED ORDER — SENNOSIDES-DOCUSATE SODIUM 8.6-50 MG PO TABS: 1.0000 | ORAL_TABLET | Freq: Two times a day (BID) | ORAL | Status: DC | PRN

## 2023-01-05 MED ORDER — ACETAMINOPHEN 325 MG PO TABS
325.0000 mg | ORAL_TABLET | ORAL | Status: DC | PRN
  Administered 2023-01-07 – 2023-01-13 (×4): 650 mg via ORAL
  Filled 2023-01-05 (×4): qty 2

## 2023-01-05 MED ORDER — APIXABAN 5 MG PO TABS
5.0000 mg | ORAL_TABLET | Freq: Two times a day (BID) | ORAL | Status: DC
  Administered 2023-01-05 – 2023-01-13 (×16): 5 mg via ORAL
  Filled 2023-01-05 (×16): qty 1

## 2023-01-05 MED ORDER — ONDANSETRON HCL 4 MG/2ML IJ SOLN: 4.0000 mg | Freq: Four times a day (QID) | INTRAMUSCULAR | Status: DC | PRN

## 2023-01-05 MED ORDER — ALUM & MAG HYDROXIDE-SIMETH 200-200-20 MG/5ML PO SUSP: 30.0000 mL | ORAL | Status: DC | PRN

## 2023-01-05 MED ORDER — INSULIN ASPART 100 UNIT/ML IJ SOLN
0.0000 [IU] | Freq: Three times a day (TID) | INTRAMUSCULAR | Status: DC
  Administered 2023-01-13: 1 [IU] via SUBCUTANEOUS

## 2023-01-05 NOTE — Telephone Encounter (Signed)
Pt LVM on nurse line 01/04/23 at 3:33pm stating that he had a stroke and is currently admitted to [facility] hospital and expects to be moved to inpatient rehab on 01/04/23. He wanted to make you aware of his current status so you can look at the hospital notes. His cardiologist has visited him while in the hospital.  No further action needed.

## 2023-01-05 NOTE — Progress Notes (Signed)
Inpatient Rehab Admissions Coordinator:    I have a CIR bed for this pt. Today. RN may call report to [phone].   Pt. To admit to CIR for 5-7 days of intensive rehab with the goal of reaching mod I goals and returning home without caregiver support.   Megan Salon, MS, CCC-SLP Rehab Admissions Coordinator  [phone] (celll) [phone] (office)

## 2023-01-05 NOTE — Progress Notes (Signed)
Discussed follow-up lab work and routine. He is difficult stick and agreed to follow-up labs on Monday.

## 2023-01-05 NOTE — Progress Notes (Signed)
Inpatient Rehabilitation Admission Medication Review by a Pharmacist  A complete drug regimen review was completed for this patient to identify any potential clinically significant medication issues.  High Risk Drug Classes Is patient taking? Indication by Medication  Antipsychotic No   Anticoagulant Yes Apixaban - Afib / CVA  Antibiotic No   Opioid No   Antiplatelet No   Hypoglycemics/insulin Yes Insulin - DM  Vasoactive Medication Yes Metoprolol - Afib / HTN  Chemotherapy No   Other Yes Ondansetron prn N/V Methocarbamol prn spasms Trazodone prn sleep Bupropion - mood Ezetimibe - HLD Xalatan - glaucoma  Rosuvastatin - HLD Gabapentin - pain     Type of Medication Issue Identified Description of Issue Recommendation(s)  Drug Interaction(s) (clinically significant)     Duplicate Therapy     Allergy     No Medication Administration End Date     Incorrect Dose     Additional Drug Therapy Needed     Significant med changes from prior encounter (inform family/care partners about these prior to discharge). Warfarin discontinued - now on apixaban  Metformin 500 mg po BID - resume if and when appropriate   Other       Clinically significant medication issues were identified that warrant physician communication and completion of prescribed/recommended actions by midnight of the next day:  No  Name of provider notified for urgent issues identified:   Provider Method of Notification:     Pharmacist comments: None  Time spent performing this drug regimen review (minutes): 30   Salik Grewell BS, PharmD, BCPS Clinical Pharmacist 01/05/2023 5:05 PM  Contact: [phone] after 3 PM  "Be curious, not judgmental..." -Debbora Dus

## 2023-01-05 NOTE — Progress Notes (Signed)
Nursing Discharge Note   Name: Rodney Friedman MRN: [id number] DOB: March 04, 1946    Admit Date: 01/02/2023  Discharge Date: 01/05/2023  Patient to discharge to [facility], 4W room [phone]. Called report to [employer]. Patient transported off 3W to 4W by 4W Staff RN and NT. Patient with verbalized understanding of discharge to CIR for rehab stay.   Discharge Instructions     Diet - low sodium heart healthy   Complete by: As directed    Increase activity slowly   Complete by: As directed          Allergies as of 01/05/2023       Reactions   Other Rash   Allergen: "Plants and bushes while doing yard work"   [employer] Extract Rash        Medication List     STOP taking these medications    ascorbic acid 1000 MG tablet Commonly known as: VITAMIN C   ascorbic acid 500 MG tablet Commonly known as: VITAMIN C   BIOTIN PO   Calcium Carb-Cholecalciferol 500-10 MG-MCG Tabs   Cholecalciferol 1.25 MG (50000 UT) capsule   docusate sodium 100 MG capsule Commonly known as: COLACE   Fish Oil 1000 MG Caps   ketoconazole 2 % shampoo Commonly known as: NIZORAL   LYCOPENE PO   Magnesium 250 MG Tabs   niacin 500 MG tablet Commonly known as: VITAMIN B3   Tums Ultra 1000 1000 MG chewable tablet Generic drug: calcium elemental as carbonate   vitamin B-12 500 MCG tablet Commonly known as: CYANOCOBALAMIN   Vitamin D (Ergocalciferol) 1.25 MG (50000 UNIT) Caps capsule Commonly known as: DRISDOL   VITAMIN-B COMPLEX PO   warfarin 4 MG tablet Commonly known as: COUMADIN       TAKE these medications    acetaminophen 325 MG tablet Commonly known as: TYLENOL Take 2 tablets (650 mg total) by mouth every 4 (four) hours as needed for mild pain (or temp > 37.5 C (99.5 F)).   albuterol 108 (90 Base) MCG/ACT inhaler Commonly known as: VENTOLIN HFA Inhale 2 puffs into the lungs every 6 (six) hours as needed for wheezing or shortness of breath.   amoxicillin 500 MG tablet Commonly  known as: AMOXIL Take 4 tablets (2000 mg) by mouth ONE HOUR before any dental procedures.   apixaban 5 MG Tabs tablet Commonly known as: ELIQUIS Take 1 tablet (5 mg total) by mouth 2 (two) times daily.   buPROPion 200 MG 12 hr tablet Commonly known as: WELLBUTRIN SR Take 1 tablet (200 mg total) by mouth every morning.   buPROPion ER 100 MG 12 hr tablet Commonly known as: Wellbutrin SR 1 qhs   ezetimibe 10 MG tablet Commonly known as: ZETIA Take 1 tablet (10 mg total) by mouth daily.   gabapentin 100 MG capsule Commonly known as: NEURONTIN 3  qhs What changed:  how much to take when to take this additional instructions   latanoprost 0.005 % ophthalmic solution Commonly known as: XALATAN Place 1 drop into the left eye every other day.   losartan 25 MG tablet Commonly known as: Cozaar Take 1 tablet (25 mg total) by mouth daily.   metFORMIN 500 MG tablet Commonly known as: GLUCOPHAGE Take 1 tablet (500 mg total) by mouth 2 (two) times daily with a meal.   metoprolol succinate 50 MG 24 hr tablet Commonly known as: TOPROL-XL Take 1 tablet (50 mg total) by mouth 2 (two) times daily. Take with or immediately following a meal.  multivitamin with minerals tablet Take 1 tablet by mouth daily.   polyethylene glycol 17 g packet Commonly known as: MIRALAX / GLYCOLAX Take 17 g by mouth 2 (two) times daily as needed for mild constipation.   PREVIDENT 5000 DRY MOUTH DT Place 1 application onto teeth See admin instructions. Apply to gums every night for dry mouth   rosuvastatin 20 MG tablet Commonly known as: CRESTOR Take 1 tablet (20 mg total) by mouth daily.   senna-docusate 8.6-50 MG tablet Commonly known as: Senokot-S Take 1 tablet by mouth 2 (two) times daily as needed for moderate constipation.

## 2023-01-05 NOTE — H&P (Signed)
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  HPI: Rodney Friedman is a 77 year old male who presented to [facility] ED as code stroke on 01/02/2023. He complained of right-sided weakness and facial droop. CT head without any obvious bleed. Maintained on Coumadin for a. fib and was therapeutic. He did not get tenecteplase because of being outside the window and being on Coumadin. No large vessel occlusion on CT. 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 Tiny acute left occipital cortex infarct. Dr. Royann Shivers, patient's cardiologist was contacted by Dr. Pearlean Brownie and she stated patient was on warfarin for cost issues. Said he can switch to Eliquis. Echo with EF ~50-55%, aortic dilation s/p TAVR. Tolerating carb modified diet. No reports of chest pain or palpitations. Perforemd transfer from multiple surfaces 8/15 with PT with CGA for safety, cures for rail use. CGA to ambulate with RW. Up to min assist for gait without RW. The patient requires inpatient medicine and rehabilitation evaluations and services for ongoing dysfunction secondary to left thalamus infarct.  Review of Systems  Constitutional:  Negative for fever.  HENT:  Negative for hearing loss.   Eyes:  Negative for blurred vision.  Respiratory:  Negative for cough.   Cardiovascular:  Negative for chest pain.  Gastrointestinal:  Positive for constipation.  Genitourinary:  Negative for dysuria.  Musculoskeletal:  Positive for joint pain and myalgias.  Skin:  Negative for rash.  Neurological:  Positive for focal weakness.  Psychiatric/Behavioral: Negative.     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 Clifton James & Dr. 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   Past Surgical History:  Procedure Laterality Date   Arm Surgery Right    from door accident with glass   CARDIAC CATHETERIZATION     CATARACT EXTRACTION W/ INTRAOCULAR LENS IMPLANT Bilateral    COLONOSCOPY     CORONARY ARTERY BYPASS GRAFT  08/04/2004   EYE SURGERY     POLYPECTOMY     RIGHT/LEFT HEART CATH AND CORONARY/GRAFT ANGIOGRAPHY N/A 01/20/2021   Procedure: RIGHT/LEFT HEART CATH AND CORONARY/GRAFT ANGIOGRAPHY;  Surgeon: Kathleene Hazel,  MD;  Location: [facility] INVASIVE CV LAB;  Service: Cardiovascular;  Laterality: N/A;   TEE WITHOUT CARDIOVERSION N/A 02/08/2021   Procedure: TRANSESOPHAGEAL ECHOCARDIOGRAM (TEE);  Surgeon: Kathleene Hazel, MD;  Location: [facility] INVASIVE CV LAB;  Service: Open Heart Surgery;  Laterality: N/A;   TRANSCATHETER AORTIC VALVE REPLACEMENT, CAROTID Left 02/08/2021   Procedure: TRANSCATHETER AORTIC VALVE REPLACEMENT, LEFT CAROTID;  Surgeon: Kathleene Hazel, MD;  Location: [facility] INVASIVE CV LAB;  Service: Open Heart Surgery;  Laterality: Left;   Family History   Problem Relation Age of Onset   Colon cancer Mother    Ovarian cancer Mother    Uterine cancer Mother    Hypertension Father    Prostate cancer Father    Colon polyps Neg Hx    Social History:  reports that he quit smoking about 2 years ago. His smoking use included cigarettes. He started smoking about 61 years ago. He has a 44.3 pack-year smoking history. He has been exposed to tobacco smoke. He has never used smokeless tobacco. He reports that he does not drink alcohol and does not use drugs. Allergies:  Allergies  Allergen Reactions   Other Rash    Allergen: "Plants and bushes while doing yard workAdministrator, Civil Service Extract Rash   Medications Prior to Admission  Medication Sig Dispense Refill   ascorbic acid (VITAMIN C) 500 MG tablet Take 1-2 tablets (500-1,000 mg total) by mouth See admin instructions. Alternate taking 500 mg one day and 1000 mg the next 60 tablet 2   B Complex Vitamins (VITAMIN-B COMPLEX PO) Take 1 tablet by mouth daily with supper.     BIOTIN PO Take 1 tablet by mouth every evening.     buPROPion (WELLBUTRIN SR) 200 MG 12 hr tablet Take 1 tablet (200 mg total) by mouth every morning. 90 tablet 2   buPROPion ER (WELLBUTRIN SR) 100 MG 12 hr tablet 1 qhs 90 tablet 4   Calcium Carb-Cholecalciferol 500-10 MG-MCG TABS Take 1 tablet by mouth daily in the afternoon.     calcium elemental as carbonate (TUMS ULTRA 1000) 400 MG chewable tablet Chew 1,000 mg by mouth daily as needed for heartburn.     Cholecalciferol 1.25 MG (50000 UT) capsule Take 50,000 Units by mouth. Every other Saturday     docusate sodium (COLACE) 100 MG capsule Take 1 capsule (100 mg total) by mouth every evening. (Patient taking differently: Take 100 mg by mouth See admin instructions. Takes one tablet once daily, four times a week.) 10 capsule 3   ezetimibe (ZETIA) 10 MG tablet Take 1 tablet (10 mg total) by mouth daily. 90 tablet 3   gabapentin (NEURONTIN) 100 MG capsule 3  qhs (Patient taking  differently: 100 mg 3 (three) times daily. At night) 270 capsule 1   latanoprost (XALATAN) 0.005 % ophthalmic solution Place 1 drop into the left eye every other day.     LYCOPENE PO Take 8 mg by mouth every other day.     Magnesium 250 MG TABS Take 250 mg by mouth every 3 (three) days.     metFORMIN (GLUCOPHAGE) 500 MG tablet Take 1 tablet (500 mg total) by mouth 2 (two) times daily with a meal. 180 tablet 1   metoprolol succinate (TOPROL-XL) 50 MG 24 hr tablet Take 1 tablet (50 mg total) by mouth 2 (two) times daily. Take with or immediately following a meal. 180 tablet 1   niacin (VITAMIN B3) 500 MG tablet Take 500 mg by mouth 3 (  three) times a week.     Omega-3 Fatty Acids (FISH OIL) 1000 MG CAPS Take 1,000 mg by mouth daily with supper.     rosuvastatin (CRESTOR) 20 MG tablet Take 1 tablet (20 mg total) by mouth daily. 90 tablet 3   Sodium Fluoride (PREVIDENT 5000 DRY MOUTH DT) Place 1 application onto teeth See admin instructions. Apply to gums every night for dry mouth     vitamin B-12 (CYANOCOBALAMIN) 500 MCG tablet Take 1 tablet (500 mcg total) by mouth every other day. 30 tablet 2   Vitamin D, Ergocalciferol, (DRISDOL) 1.25 MG (50000 UNIT) CAPS capsule Take 1 capsule (50,000 Units total) by mouth every Saturday. (Patient taking differently: Take 50,000 Units by mouth See admin instructions. Once a month) 4 capsule 2   warfarin (COUMADIN) 4 MG tablet TAKE 1/2 TO 1 TABLET DAILY AS DIRECTED BY COUMADIN CLINIC 90 tablet 1   amoxicillin (AMOXIL) 500 MG tablet Take 4 tablets (2000 mg) by mouth ONE HOUR before any dental procedures. (Patient not taking: Reported on 01/03/2023) 12 tablet 4   ascorbic acid (VITAMIN C) 1000 MG tablet Take 1,000 mg by mouth daily. (Patient not taking: Reported on 01/03/2023)     ketoconazole (NIZORAL) 2 % shampoo Apply 1 application topically 2 (two) times a week. (Patient not taking: Reported on 01/03/2023) 120 mL 3      Home: Home Living Family/patient expects to  be discharged to:: Inpatient rehab Living Arrangements: Alone Available Help at Discharge: Family, Available PRN/intermittently, Friend(s) Type of Home: House Home Access: Stairs to enter [employer] of Steps: 4 Entrance Stairs-Rails: Right, Left Home Layout: One level Bathroom Shower/Tub: Psychologist, counselling, Engineer, manufacturing systems: Handicapped height Bathroom Accessibility: Yes Home Equipment: Grab bars - tub/shower, Shower seat, Agricultural consultant (2 wheels), [employer] - single point, Transport chair Additional Comments: Pt reports son is single father and not able to assist much.  States he has a couple friends from AA could help occasionally.  Lives With: Alone   Functional History: Prior Function Prior Level of Function : Independent/Modified Independent, Driving Mobility Comments: could ambulate iin community without AD ADLs Comments: independent with adls and iadls  Functional Status:  Mobility: Bed Mobility Overal bed mobility: Needs Assistance Bed Mobility: Supine to Sit, Sit to Supine Supine to sit: HOB elevated, Supervision Sit to supine: Supervision General bed mobility comments: Sitting EOB Transfers Overall transfer level: Needs assistance Equipment used: Rolling walker (2 wheels) Transfers: Sit to/from Stand Sit to Stand: Contact guard assist Bed to/from chair/wheelchair/BSC transfer type:: Step pivot Step pivot transfers: Min assist General transfer comment: CGA for safety, improved power up today, performed from bed x2, and standard chair with arm rests. X1 from toilet with rail use. Able to raise/lower brief standing in front of toilet, using back of knees to brace himself, CGA. Pt requires VC for walker placement prior to sitting and for reaching back, shows some instability. Ambulation/Gait Ambulation/Gait assistance: Min assist Gait Distance (Feet): 100 Feet Assistive device: Rolling walker (2 wheels), None, 1 person hand held assist Gait  Pattern/deviations: Decreased stride length, Step-through pattern, Knees buckling, Knee hyperextension - left, Shuffle, Ataxic, Trunk flexed, Staggering right, Festinating General Gait Details: CGA with RW for support, vc for upright posture and to keep RW within BOS. Min assist without AD, reaching for wall, staggering slightly towards Rt, increased shuffle and a little festinating. Cues for safety and awareness throughout. One epidose of posterior LOB while backing up to chair, needed min assist to correct. Gait  velocity: decreased Gait velocity interpretation: <1.31 ft/sec, indicative of household ambulator    ADL: ADL Overall ADL's : Needs assistance/impaired Eating/Feeding: Set up, Sitting Grooming: Wash/dry hands, Wash/dry face, Contact guard assist, Standing Upper Body Bathing: Set up, Sitting Upper Body Bathing Details (indicate cue type and reason): simulated Lower Body Bathing: Minimal assistance, Sit to/from stand Upper Body Dressing : Minimal assistance, Sitting Upper Body Dressing Details (indicate cue type and reason): assist for tying gown in the front Lower Body Dressing: Minimal assistance, Sit to/from stand Toilet Transfer: Minimal assistance, Ambulation Toilet Transfer Details (indicate cue type and reason): min assist without use of an assistive device, min guard with use Toileting- Clothing Manipulation and Hygiene: Minimal assistance, Sit to/from stand Functional mobility during ADLs: Minimal assistance (hand held assist on the right and pt holding IV pole on the left) General ADL Comments: Pt with increased ataxia noted with RUE use and in the RLE with standing and mobility.  Needs UE support during standing and mobility for safety.  Pt lives alone and was driving as needed to appointments.  Son lives in the area but is not able to assist consistently.  Cognition: Cognition Overall Cognitive Status: Within Functional Limits for tasks assessed Arousal/Alertness:  Awake/alert Orientation Level: Oriented X4 Year: 2024 Month: August Day of Week: Correct Attention: Focused, Sustained Focused Attention: Appears intact Sustained Attention: Appears intact Memory: Impaired Memory Impairment: Decreased short term memory Awareness: Appears intact Problem Solving: Appears intact Executive Function: Reasoning Reasoning: Appears intact Safety/Judgment: Appears intact Cognition Arousal: Alert Behavior During Therapy: WFL for tasks assessed/performed Overall Cognitive Status: Within Functional Limits for tasks assessed General Comments: Impulsive at times. Aware of deficits but seems to have reduced awareness of limits and safety.  Physical Exam: Blood pressure (!) 123/90, pulse 84, temperature 98.9 F (37.2 C), temperature source Oral, resp. rate 15, height 5\' 8"  (1.727 m), weight 90.4 kg, SpO2 100%. Physical Exam Constitutional:      General: He is not in acute distress.    Appearance: He is obese.  HENT:     Head: Normocephalic and atraumatic.     Right Ear: External ear normal.     Left Ear: External ear normal.     Nose: Nose normal.     Mouth/Throat:     Mouth: Mucous membranes are moist.  Eyes:     Extraocular Movements: Extraocular movements intact.     Pupils: Pupils are equal, round, and reactive to light.  Cardiovascular:     Rate and Rhythm: Normal rate and regular rhythm.     Heart sounds: Murmur heard.  Pulmonary:     Effort: Pulmonary effort is normal. No respiratory distress.     Breath sounds: No wheezing.  Abdominal:     General: Bowel sounds are normal. There is no distension.     Palpations: Abdomen is soft.     Tenderness: There is no abdominal tenderness.  Musculoskeletal:        General: Tenderness (left thigh) present.     Cervical back: Normal range of motion.  Skin:    General: Skin is warm and dry.     Comments: No apparent bruising left thigh but other ecchymoses evident on arms.  Neurological:     Comments:  Alert and oriented x 3. Normal insight and awareness. Intact Memory. Normal language and speech. Cranial nerve exam unremarkable except for ?mild central VII. MMT: RUE 4-/5 prox to distal. LUE 5/5. RLE 4 to 4+/5. LLE 3/5 HF/KE (pain) and 5/5 ADF/PF.Right  pronator drift and decreased FMC RUE as a whole.  No focal sensory findings appreciated. Normal resting tone. DTR's 1+.    Psychiatric:        Mood and Affect: Mood normal.        Behavior: Behavior normal.     Comments: Very talkative!     Results for orders placed or performed during the hospital encounter of 01/02/23 (from the past 48 hour(s))  Glucose, capillary     Status: Abnormal   Collection Time: 01/03/23  3:41 PM  Result Value Ref Range   Glucose-Capillary 110 (H) 70 - 99 mg/dL    Comment: Glucose reference range applies only to samples taken after fasting for at least 8 hours.   Comment 1 Notify RN   Glucose, capillary     Status: Abnormal   Collection Time: 01/03/23  9:19 PM  Result Value Ref Range   Glucose-Capillary 179 (H) 70 - 99 mg/dL    Comment: Glucose reference range applies only to samples taken after fasting for at least 8 hours.  Protime-INR     Status: Abnormal   Collection Time: 01/04/23 12:13 AM  Result Value Ref Range   Prothrombin Time 24.1 (H) 11.4 - 15.2 seconds   INR 2.1 (H) 0.8 - 1.2    Comment: (NOTE) INR goal varies based on device and disease states. Performed at [facility], [address], [place], [place] [address]   Glucose, capillary     Status: Abnormal   Collection Time: 01/04/23  6:20 AM  Result Value Ref Range   Glucose-Capillary 107 (H) 70 - 99 mg/dL    Comment: Glucose reference range applies only to samples taken after fasting for at least 8 hours.  Glucose, capillary     Status: Abnormal   Collection Time: 01/04/23 11:59 AM  Result Value Ref Range   Glucose-Capillary 112 (H) 70 - 99 mg/dL    Comment: Glucose reference range applies only to samples taken after fasting for at  least 8 hours.  VITAMIN D 25 Hydroxy (Vit-D Deficiency, Fractures)     Status: None   Collection Time: 01/04/23  2:26 PM  Result Value Ref Range   Vit D, 25-Hydroxy 81.29 30 - 100 ng/mL    Comment: (NOTE) Vitamin D deficiency has been defined by the Institute of Medicine  and an Endocrine Society practice guideline as a level of serum 25-OH  vitamin D less than 20 ng/mL (1,2). The Endocrine Society went on to  further define vitamin D insufficiency as a level between 21 and 29  ng/mL (2).  1. IOM (Institute of Medicine). 2010. Dietary reference intakes for  calcium and D. [place]: [employer]. 2. Holick MF, Binkley Kirby, Bischoff-Ferrari HA, et al. Evaluation,  treatment, and prevention of vitamin D deficiency: an Endocrine  Society clinical practice guideline, JCEM. 2011 Jul; 96(7): 1911-30.  Performed at [facility], [address], [place], [place] [address]   Glucose, capillary     Status: Abnormal   Collection Time: 01/04/23  4:49 PM  Result Value Ref Range   Glucose-Capillary 103 (H) 70 - 99 mg/dL    Comment: Glucose reference range applies only to samples taken after fasting for at least 8 hours.  Glucose, capillary     Status: Abnormal   Collection Time: 01/04/23  9:04 PM  Result Value Ref Range   Glucose-Capillary 182 (H) 70 - 99 mg/dL    Comment: Glucose reference range applies only to  samples taken after fasting for at least 8 hours.  Magnesium     Status: Abnormal   Collection Time: 01/05/23  3:34 AM  Result Value Ref Range   Magnesium 1.6 (L) 1.7 - 2.4 mg/dL    Comment: Performed at [facility], [address], [place], [place] [address]  CBC     Status: Abnormal   Collection Time: 01/05/23  3:34 AM  Result Value Ref Range   WBC 13.0 (H) 4.0 - 10.5 K/uL   RBC 4.77 4.22 - 5.81 MIL/uL   Hemoglobin 15.2 13.0 - 17.0 g/dL   HCT 24.4 01.0 - 27.2 %   MCV 90.6 80.0 - 100.0 fL   MCH 31.9 26.0 - 34.0 pg   MCHC 35.2 30.0 - 36.0 g/dL    RDW 53.6 64.4 - 03.4 %   Platelets 122 (L) 150 - 400 K/uL   nRBC 0.0 0.0 - 0.2 %    Comment: Performed at [facility], [address], [place], [place] [address]  Renal function panel     Status: Abnormal   Collection Time: 01/05/23  3:34 AM  Result Value Ref Range   Sodium 139 135 - 145 mmol/L   Potassium 4.0 3.5 - 5.1 mmol/L   Chloride 109 98 - 111 mmol/L   CO2 19 (L) 22 - 32 mmol/L   Glucose, Bld 92 70 - 99 mg/dL    Comment: Glucose reference range applies only to samples taken after fasting for at least 8 hours.   BUN 18 8 - 23 mg/dL   Creatinine, Ser 5.63 0.61 - 1.24 mg/dL   Calcium 8.9 8.9 - 87.5 mg/dL   Phosphorus 3.6 2.5 - 4.6 mg/dL   Albumin 3.3 (L) 3.5 - 5.0 g/dL   GFR, Estimated >64 >33 mL/min    Comment: (NOTE) Calculated using the CKD-EPI Creatinine Equation (2021)    Anion gap 11 5 - 15    Comment: Performed at [facility], [address], [place], [place] [address]  Glucose, capillary     Status: Abnormal   Collection Time: 01/05/23  6:27 AM  Result Value Ref Range   Glucose-Capillary 108 (H) 70 - 99 mg/dL    Comment: Glucose reference range applies only to samples taken after fasting for at least 8 hours.   No results found.    Blood pressure (!) 123/90, pulse 84, temperature 98.9 F (37.2 C), temperature source Oral, resp. rate 15, height 5\' 8"  (1.727 m), weight 90.4 kg, SpO2 100%.  Medical Problem List and Plan: 1. Functional deficits secondary to left thalamic and occipital infarcts  -patient may shower  -ELOS/Goals: 5-7 days, mod I goals with PT and OT  2.  Antithrombotics: -DVT/anticoagulation:  Pharmaceutical: Eliquis  -antiplatelet therapy: none  3. Pain Management: Tylenol as needed  -continue gabapentin 300 mg q HS  4. Mood/Behavior/Sleep: LCSW to evaluate and provide emotional support  -continue bupropion 200 mg q AM and 100 mg q HS for depression history  -antipsychotic agents: n/a  5. Neuropsych/cognition: This patient is  capable of making decisions on his own behalf.  6. Skin/Wound Care: Routine skin care checks   7. Fluids/Electrolytes/Nutrition: Routine Is and Os and follow-up chemistries  -continue B12  8: Hypertension: monitor TID and prn  -Toprol-XL 50 mg BID  9: Hyperlipidemia: continue statin, Zetia  10: Glaucoma: continue Xalatan  11: Atrial fibrillation: on BB and Eliquis  12: CLL/prostate cancer: follows with Dr. Bertis Ruddy  13: DM-2: CBG QID;  at home on metformin 500 mg daily  -continue SSI, monitor intake and consider restarting metformin  14: Mild leukocytosis: afebrile, improving  -follow-up CBC with diff   15: CAD: stable   16: Severe AS s/p TAVR--exercise tolerance reasonable at present      Milinda Antis, PA-C 01/05/2023

## 2023-01-05 NOTE — H&P (Signed)
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   HPI: Rodney Friedman is a 77 year old male who presented to [facility] ED as code stroke on 01/02/2023. He complained of right-sided weakness and facial droop. CT head without any obvious bleed. Maintained on Coumadin for a. fib and was therapeutic. He did not get tenecteplase because of being outside the window and being on Coumadin. No large vessel occlusion on CT. 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 Tiny acute left occipital cortex infarct. Dr. Royann Shivers, patient's cardiologist was contacted by Dr. Pearlean Brownie and she stated patient was on warfarin for cost issues. Said he can switch to Eliquis. Echo with EF ~50-55%, aortic dilation s/p TAVR. Tolerating carb modified diet. No reports of chest pain or palpitations. Perforemd transfer from multiple surfaces 8/15 with PT with CGA for safety, cures for rail use. CGA to ambulate with RW. Up to min assist for gait without RW. The patient requires inpatient medicine and rehabilitation evaluations and services for ongoing dysfunction secondary to left thalamus infarct.   Review of Systems  Constitutional:  Negative for fever.  HENT:  Negative for hearing loss.   Eyes:  Negative for blurred vision.  Respiratory:  Negative for cough.   Cardiovascular:  Negative for chest pain.  Gastrointestinal:  Positive for constipation.  Genitourinary:  Negative for dysuria.  Musculoskeletal:  Positive for joint pain and myalgias.  Skin:  Negative for rash.  Neurological:  Positive for focal weakness.  Psychiatric/Behavioral: Negative.          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 Clifton James & Dr. Laneta Simmers   Sleep apnea     Substance abuse [facility])     Tremor, essential 09/22/2015             Past Surgical History:  Procedure Laterality Date   Arm Surgery Right      from door accident with glass   CARDIAC CATHETERIZATION       CATARACT EXTRACTION W/ INTRAOCULAR LENS  IMPLANT Bilateral     COLONOSCOPY       CORONARY ARTERY BYPASS GRAFT   08/04/2004   EYE SURGERY       POLYPECTOMY       RIGHT/LEFT HEART CATH AND CORONARY/GRAFT ANGIOGRAPHY N/A 01/20/2021    Procedure: RIGHT/LEFT HEART CATH AND CORONARY/GRAFT ANGIOGRAPHY;  Surgeon: Kathleene Hazel, MD;  Location: [facility] INVASIVE CV LAB;  Service: Cardiovascular;  Laterality: N/A;   TEE WITHOUT CARDIOVERSION N/A 02/08/2021    Procedure: TRANSESOPHAGEAL ECHOCARDIOGRAM (TEE);  Surgeon: Kathleene Hazel, MD;  Location: [facility] INVASIVE CV LAB;  Service: Open Heart Surgery;  Laterality: N/A;   TRANSCATHETER AORTIC VALVE REPLACEMENT, CAROTID Left 02/08/2021    Procedure: TRANSCATHETER AORTIC VALVE REPLACEMENT, LEFT CAROTID;  Surgeon: Kathleene Hazel, MD;  Location: [facility] INVASIVE CV LAB;  Service: Open  Heart Surgery;  Laterality: Left;             Family History  Problem Relation Age of Onset   Colon cancer Mother     Ovarian cancer Mother     Uterine cancer Mother     Hypertension Father     Prostate cancer Father     Colon polyps Neg Hx          Social History:  reports that he quit smoking about 2 years ago. His smoking use included cigarettes. He started smoking about 61 years ago. He has a 44.3 pack-year smoking history. He has been exposed to tobacco smoke. He has never used smokeless tobacco. He reports that he does not drink alcohol and does not use drugs. Allergies:  Allergies       Allergies  Allergen Reactions   Other Rash      Allergen: "Plants and bushes while doing yard workAdministrator, Civil Service Extract Rash            Medications Prior to Admission  Medication Sig Dispense Refill   ascorbic acid (VITAMIN C) 500 MG tablet Take 1-2 tablets (500-1,000 mg total) by mouth See admin instructions. Alternate taking 500 mg one day and 1000 mg the next 60 tablet 2   B Complex Vitamins (VITAMIN-B COMPLEX PO) Take 1 tablet by mouth daily with supper.       BIOTIN PO Take 1 tablet by mouth every evening.       buPROPion (WELLBUTRIN SR) 200 MG 12 hr tablet Take 1 tablet (200 mg total) by mouth every morning. 90 tablet 2   buPROPion ER (WELLBUTRIN SR) 100 MG 12 hr tablet 1 qhs 90 tablet 4   Calcium Carb-Cholecalciferol 500-10 MG-MCG TABS Take 1 tablet by mouth daily in the afternoon.       calcium elemental as carbonate (TUMS ULTRA 1000) 400 MG chewable tablet Chew 1,000 mg by mouth daily as needed for heartburn.       Cholecalciferol 1.25 MG (50000 UT) capsule Take 50,000 Units by mouth. Every other Saturday       docusate sodium (COLACE) 100 MG capsule Take 1 capsule (100 mg total) by mouth every evening. (Patient taking differently: Take 100 mg by mouth See admin instructions. Takes one tablet once daily, four times a week.) 10 capsule 3   ezetimibe (ZETIA) 10 MG tablet Take 1  tablet (10 mg total) by mouth daily. 90 tablet 3   gabapentin (NEURONTIN) 100 MG capsule 3  qhs (Patient taking differently: 100 mg 3 (three) times daily. At night) 270 capsule 1   latanoprost (XALATAN) 0.005 % ophthalmic solution Place 1 drop  into the left eye every other day.       LYCOPENE PO Take 8 mg by mouth every other day.       Magnesium 250 MG TABS Take 250 mg by mouth every 3 (three) days.       metFORMIN (GLUCOPHAGE) 500 MG tablet Take 1 tablet (500 mg total) by mouth 2 (two) times daily with a meal. 180 tablet 1   metoprolol succinate (TOPROL-XL) 50 MG 24 hr tablet Take 1 tablet (50 mg total) by mouth 2 (two) times daily. Take with or immediately following a meal. 180 tablet 1   niacin (VITAMIN B3) 500 MG tablet Take 500 mg by mouth 3 (three) times a week.       Omega-3 Fatty Acids (FISH OIL) 1000 MG CAPS Take 1,000 mg by mouth daily with supper.       rosuvastatin (CRESTOR) 20 MG tablet Take 1 tablet (20 mg total) by mouth daily. 90 tablet 3   Sodium Fluoride (PREVIDENT 5000 DRY MOUTH DT) Place 1 application onto teeth See admin instructions. Apply to gums every night for dry mouth       vitamin B-12 (CYANOCOBALAMIN) 500 MCG tablet Take 1 tablet (500 mcg total) by mouth every other day. 30 tablet 2   Vitamin D, Ergocalciferol, (DRISDOL) 1.25 MG (50000 UNIT) CAPS capsule Take 1 capsule (50,000 Units total) by mouth every Saturday. (Patient taking differently: Take 50,000 Units by mouth See admin instructions. Once a month) 4 capsule 2   warfarin (COUMADIN) 4 MG tablet TAKE 1/2 TO 1 TABLET DAILY AS DIRECTED BY COUMADIN CLINIC 90 tablet 1   amoxicillin (AMOXIL) 500 MG tablet Take 4 tablets (2000 mg) by mouth ONE HOUR before any dental procedures. (Patient not taking: Reported on 01/03/2023) 12 tablet 4   ascorbic acid (VITAMIN C) 1000 MG tablet Take 1,000 mg by mouth daily. (Patient not taking: Reported on 01/03/2023)       ketoconazole (NIZORAL) 2 % shampoo Apply 1 application topically 2  (two) times a week. (Patient not taking: Reported on 01/03/2023) 120 mL 3              Home: Home Living Family/patient expects to be discharged to:: Inpatient rehab Living Arrangements: Alone Available Help at Discharge: Family, Available PRN/intermittently, Friend(s) Type of Home: House Home Access: Stairs to enter [employer] of Steps: 4 Entrance Stairs-Rails: Right, Left Home Layout: One level Bathroom Shower/Tub: Psychologist, counselling, Engineer, manufacturing systems: Handicapped height Bathroom Accessibility: Yes Home Equipment: Grab bars - tub/shower, Shower seat, Agricultural consultant (2 wheels), [employer] - single point, Transport chair Additional Comments: Pt reports son is single father and not able to assist much.  States he has a couple friends from AA could help occasionally.  Lives With: Alone   Functional History: Prior Function Prior Level of Function : Independent/Modified Independent, Driving Mobility Comments: could ambulate iin community without AD ADLs Comments: independent with adls and iadls   Functional Status:  Mobility: Bed Mobility Overal bed mobility: Needs Assistance Bed Mobility: Supine to Sit, Sit to Supine Supine to sit: HOB elevated, Supervision Sit to supine: Supervision General bed mobility comments: Sitting EOB Transfers Overall transfer level: Needs assistance Equipment used: Rolling walker (2 wheels) Transfers: Sit to/from Stand Sit to Stand: Contact guard assist Bed to/from chair/wheelchair/BSC transfer type:: Step pivot Step pivot transfers: Min assist General transfer comment: CGA for safety, improved power up today, performed from bed x2, and standard chair with arm rests. X1 from toilet with rail  use. Able to raise/lower brief standing in front of toilet, using back of knees to brace himself, CGA. Pt requires VC for walker placement prior to sitting and for reaching back, shows some instability. Ambulation/Gait Ambulation/Gait  assistance: Min assist Gait Distance (Feet): 100 Feet Assistive device: Rolling walker (2 wheels), None, 1 person hand held assist Gait Pattern/deviations: Decreased stride length, Step-through pattern, Knees buckling, Knee hyperextension - left, Shuffle, Ataxic, Trunk flexed, Staggering right, Festinating General Gait Details: CGA with RW for support, vc for upright posture and to keep RW within BOS. Min assist without AD, reaching for wall, staggering slightly towards Rt, increased shuffle and a little festinating. Cues for safety and awareness throughout. One epidose of posterior LOB while backing up to chair, needed min assist to correct. Gait velocity: decreased Gait velocity interpretation: <1.31 ft/sec, indicative of household ambulator   ADL: ADL Overall ADL's : Needs assistance/impaired Eating/Feeding: Set up, Sitting Grooming: Wash/dry hands, Wash/dry face, Contact guard assist, Standing Upper Body Bathing: Set up, Sitting Upper Body Bathing Details (indicate cue type and reason): simulated Lower Body Bathing: Minimal assistance, Sit to/from stand Upper Body Dressing : Minimal assistance, Sitting Upper Body Dressing Details (indicate cue type and reason): assist for tying gown in the front Lower Body Dressing: Minimal assistance, Sit to/from stand Toilet Transfer: Minimal assistance, Ambulation Toilet Transfer Details (indicate cue type and reason): min assist without use of an assistive device, min guard with use Toileting- Clothing Manipulation and Hygiene: Minimal assistance, Sit to/from stand Functional mobility during ADLs: Minimal assistance (hand held assist on the right and pt holding IV pole on the left) General ADL Comments: Pt with increased ataxia noted with RUE use and in the RLE with standing and mobility.  Needs UE support during standing and mobility for safety.  Pt lives alone and was driving as needed to appointments.  Son lives in the area but is not able to assist  consistently.   Cognition: Cognition Overall Cognitive Status: Within Functional Limits for tasks assessed Arousal/Alertness: Awake/alert Orientation Level: Oriented X4 Year: 2024 Month: August Day of Week: Correct Attention: Focused, Sustained Focused Attention: Appears intact Sustained Attention: Appears intact Memory: Impaired Memory Impairment: Decreased short term memory Awareness: Appears intact Problem Solving: Appears intact Executive Function: Reasoning Reasoning: Appears intact Safety/Judgment: Appears intact Cognition Arousal: Alert Behavior During Therapy: WFL for tasks assessed/performed Overall Cognitive Status: Within Functional Limits for tasks assessed General Comments: Impulsive at times. Aware of deficits but seems to have reduced awareness of limits and safety.   Physical Exam: Blood pressure (!) 123/90, pulse 84, temperature 98.9 F (37.2 C), temperature source Oral, resp. rate 15, height 5\' 8"  (1.727 m), weight 90.4 kg, SpO2 100%. Physical Exam Constitutional:      General: He is not in acute distress.    Appearance: He is obese.  HENT:     Head: Normocephalic and atraumatic.     Right Ear: External ear normal.     Left Ear: External ear normal.     Nose: Nose normal.     Mouth/Throat:     Mouth: Mucous membranes are moist.  Eyes:     Extraocular Movements: Extraocular movements intact.     Pupils: Pupils are equal, round, and reactive to light.  Cardiovascular:     Rate and Rhythm: Normal rate and regular rhythm.     Heart sounds: Murmur heard.  Pulmonary:     Effort: Pulmonary effort is normal. No respiratory distress.     Breath sounds: No wheezing.  Abdominal:     General: Bowel sounds are normal. There is no distension.     Palpations: Abdomen is soft.     Tenderness: There is no abdominal tenderness.  Musculoskeletal:        General: Tenderness (left thigh) present.     Cervical back: Normal range of motion.  Skin:    General: Skin  is warm and dry.     Comments: No apparent bruising left thigh but other ecchymoses evident on arms.  Neurological:     Comments: Alert and oriented x 3. Normal insight and awareness. Intact Memory. Normal language and speech. Cranial nerve exam unremarkable except for ?mild central VII. MMT: RUE 4-/5 prox to distal. LUE 5/5. RLE 4 to 4+/5. LLE 3/5 HF/KE (pain) and 5/5 ADF/PF.Right pronator drift and decreased FMC RUE as a whole.  No focal sensory findings appreciated. Normal resting tone. DTR's 1+.    Psychiatric:        Mood and Affect: Mood normal.        Behavior: Behavior normal.     Comments: Very talkative!        Lab Results Last 48 Hours        Results for orders placed or performed during the hospital encounter of 01/02/23 (from the past 48 hour(s))  Glucose, capillary     Status: Abnormal    Collection Time: 01/03/23  3:41 PM  Result Value Ref Range    Glucose-Capillary 110 (H) 70 - 99 mg/dL      Comment: Glucose reference range applies only to samples taken after fasting for at least 8 hours.    Comment 1 Notify RN    Glucose, capillary     Status: Abnormal    Collection Time: 01/03/23  9:19 PM  Result Value Ref Range    Glucose-Capillary 179 (H) 70 - 99 mg/dL      Comment: Glucose reference range applies only to samples taken after fasting for at least 8 hours.  Protime-INR     Status: Abnormal    Collection Time: 01/04/23 12:13 AM  Result Value Ref Range    Prothrombin Time 24.1 (H) 11.4 - 15.2 seconds    INR 2.1 (H) 0.8 - 1.2      Comment: (NOTE) INR goal varies based on device and disease states. Performed at [facility], [address] [place], [place] [address]    Glucose, capillary     Status: Abnormal    Collection Time: 01/04/23  6:20 AM  Result Value Ref Range    Glucose-Capillary 107 (H) 70 - 99 mg/dL      Comment: Glucose reference range applies only to samples taken after fasting for at least 8 hours.  Glucose, capillary     Status: Abnormal     Collection Time: 01/04/23 11:59 AM  Result Value Ref Range    Glucose-Capillary 112 (H) 70 - 99 mg/dL      Comment: Glucose reference range applies only to samples taken after fasting for at least 8 hours.  VITAMIN D 25 Hydroxy (Vit-D Deficiency, Fractures)     Status: None    Collection Time: 01/04/23  2:26 PM  Result Value Ref Range    Vit D, 25-Hydroxy 81.29 30 - 100 ng/mL      Comment: (NOTE) Vitamin D deficiency has been defined by the Institute of Medicine  and an Endocrine Society practice guideline as a level of serum 25-OH  vitamin D less than 20 ng/mL (1,2). The Endocrine  Society went on to  further define vitamin D insufficiency as a level between 21 and 29  ng/mL (2).   1. IOM (Institute of Medicine). 2010. Dietary reference intakes for  calcium and D. [place]: [employer]. 2. Holick MF, Binkley [place], Bischoff-Ferrari HA, et al. Evaluation,  treatment, and prevention of vitamin D deficiency: an Endocrine  Society clinical practice guideline, JCEM. 2011 Jul; 96(7): 1911-30.   Performed at [facility], [address], [place], [place] [address]    Glucose, capillary     Status: Abnormal    Collection Time: 01/04/23  4:49 PM  Result Value Ref Range    Glucose-Capillary 103 (H) 70 - 99 mg/dL      Comment: Glucose reference range applies only to samples taken after fasting for at least 8 hours.  Glucose, capillary     Status: Abnormal    Collection Time: 01/04/23  9:04 PM  Result Value Ref Range    Glucose-Capillary 182 (H) 70 - 99 mg/dL      Comment: Glucose reference range applies only to samples taken after fasting for at least 8 hours.  Magnesium     Status: Abnormal    Collection Time: 01/05/23  3:34 AM  Result Value Ref Range    Magnesium 1.6 (L) 1.7 - 2.4 mg/dL      Comment: Performed at [facility], [address] [place], [place] [address]  CBC     Status: Abnormal    Collection Time: 01/05/23  3:34 AM  Result Value Ref  Range    WBC 13.0 (H) 4.0 - 10.5 K/uL    RBC 4.77 4.22 - 5.81 MIL/uL    Hemoglobin 15.2 13.0 - 17.0 g/dL    HCT 09.8 11.9 - 14.7 %    MCV 90.6 80.0 - 100.0 fL    MCH 31.9 26.0 - 34.0 pg    MCHC 35.2 30.0 - 36.0 g/dL    RDW 82.9 56.2 - 13.0 %    Platelets 122 (L) 150 - 400 K/uL    nRBC 0.0 0.0 - 0.2 %      Comment: Performed at [facility], [address], [place], [place] [address]  Renal function panel     Status: Abnormal    Collection Time: 01/05/23  3:34 AM  Result Value Ref Range    Sodium 139 135 - 145 mmol/L    Potassium 4.0 3.5 - 5.1 mmol/L    Chloride 109 98 - 111 mmol/L    CO2 19 (L) 22 - 32 mmol/L    Glucose, Bld 92 70 - 99 mg/dL      Comment: Glucose reference range applies only to samples taken after fasting for at least 8 hours.    BUN 18 8 - 23 mg/dL    Creatinine, Ser 4.69 0.61 - 1.24 mg/dL    Calcium 8.9 8.9 - 62.9 mg/dL    Phosphorus 3.6 2.5 - 4.6 mg/dL    Albumin 3.3 (L) 3.5 - 5.0 g/dL    GFR, Estimated >52 >84 mL/min      Comment: (NOTE) Calculated using the CKD-EPI Creatinine Equation (2021)      Anion gap 11 5 - 15      Comment: Performed at [facility], [address] [place], [place] [address]  Glucose, capillary     Status: Abnormal    Collection Time: 01/05/23  6:27 AM  Result Value Ref Range    Glucose-Capillary  108 (H) 70 - 99 mg/dL      Comment: Glucose reference range applies only to samples taken after fasting for at least 8 hours.      Imaging Results (Last 48 hours)  No results found.         Blood pressure (!) 123/90, pulse 84, temperature 98.9 F (37.2 C), temperature source Oral, resp. rate 15, height 5\' 8"  (1.727 m), weight 90.4 kg, SpO2 100%.   Medical Problem List and Plan: 1. Functional deficits secondary to left thalamic and occipital infarcts             -patient may shower             -ELOS/Goals: 5-7 days, mod I goals with PT and OT   2.  Antithrombotics: -DVT/anticoagulation:  Pharmaceutical: Eliquis              -antiplatelet therapy: none   3. Pain Management: Tylenol as needed             -continue gabapentin 300 mg q HS   -heat or ice prn for left tight discomfort 4. Mood/Behavior/Sleep: LCSW to evaluate and provide emotional support             -continue bupropion 200 mg q AM and 100 mg q HS for depression history             -antipsychotic agents: n/a   5. Neuropsych/cognition: This patient is capable of making decisions on his own behalf.   6. Skin/Wound Care: Routine skin care checks   7. Fluids/Electrolytes/Nutrition: Routine Is and Os and follow-up chemistries             -continue B12   8: Hypertension: monitor TID and prn             -Toprol-XL 50 mg BID   9: Hyperlipidemia: continue statin, Zetia   10: Glaucoma: continue Xalatan   11: Atrial fibrillation: on BB and Eliquis   12: CLL/prostate cancer: follows with Dr. Bertis Ruddy   13: DM-2: CBG QID; at home on metformin 500 mg daily             -continue SSI, monitor intake and consider restarting metformin   14: Mild leukocytosis: afebrile, improving             -follow-up CBC with diff              15: CAD: stable    16: Severe AS s/p TAVR--exercise tolerance reasonable at present        Milinda Antis, PA-C 01/05/2023  I have personally performed a face to face diagnostic evaluation of this patient and formulated the key components of the plan.  Additionally, I have personally reviewed laboratory data, imaging studies, as well as relevant notes and concur with the physician assistant's documentation above.  The patient's status has not changed from the original H&P.  Any changes in documentation from the acute care chart have been noted above.  Ranelle Oyster, MD, [place] [facility]

## 2023-01-05 NOTE — Progress Notes (Signed)
Inpatient Rehab Admissions Coordinator:   I do not have a bed for this Pt. On CIR this morning. It is possible a bed may open up for him by this afternoon. I will follow up if that is the case.   Megan Salon, MS, CCC-SLP Rehab Admissions Coordinator  [phone] (celll) [phone] (office)

## 2023-01-05 NOTE — Discharge Summary (Signed)
Physician Discharge Summary  Rodney Friedman ZOX:[id number] DOB: 1946/04/22 DOA: 01/02/2023  PCP: Ivonne [name], NP  Admit date: 01/02/2023 Discharge date: 01/05/2023 Admitted From: Home. Disposition: CIR Recommendations for Outpatient Follow-up:  Follow up with neurology in 4 to 6 weeks after discharge from CIR Check CMP and CBC in 1 week Please follow up on the following pending results: None   Discharge Condition: Stable CODE STATUS: Full code   Hospital course 77 year old M with PMH of A-fib on warfarin, CAD/CABG, CLL, prostate cancer, HTN, COPD, OSA, DM-2, AS s/p TAVR and HLD presenting with right-sided weakness and subsequent fall, and found to have acute lacunar infarct of left thalamus and left occipital cortex.  Patient was outside tPA window on arrival to ED.  Reportedly struggling with his INR on warfarin.  Neurology consulted.  Changed warfarin to Eliquis.  CT angio with 60% stenosis in left carotid bifurcation and 40% stenosis in right carotid bifurcation.  TTE with LVEF of 55 to 60% and severe LVH.  LDL 28.  A1c pending.  Neurology recommended Eliquis and signed off.  Symptoms improved.  Patient discharged to CIR before returning home per recommendation by therapy.  Recommend outpatient follow-up with neurology in 4 to 6 weeks after discharge from CIR.  See individual problem list below for more.   Problems addressed during this hospitalization Principal Problem:   Acute ischemic stroke [facility]) Active Problems:   Acute CVA (cerebrovascular accident) (HCC)   Acute ischemic stroke: Presented with right-sided weakness.  MRI showed acute lacunar infarct of left thalamus and left occipital cortex.  Patient was outside tPA window on arrival to ED.  Reportedly struggling with his INR on warfarin.  INR was 2.4 on admission but 1.7 on 8/9).  CT angio with 60% stenosis in left carotid bifurcation and 40% stenosis in right carotid bifurcation.  TTE with LVEF of 55 to 60% and severe  LVH.  LDL 28.  A1c 6.8%.  Symptoms improved significantly. -Neurology recommended Eliquis and signed off.   -Continue home Crestor and Zetia. -Outpatient follow-up with neurology in 4 to 6 weeks after discharge from CIR -Continue therapy at CIR   Controlled NIDDM-2: A1c 6.8%.  CBG within acceptable range. -Continue low-dose metformin   Hypertension: BP elevated. -Continue Toprol-XL. -Added low-dose losartan.   Chronic atrial fibrillation: Rate controlled -Continue home Toprol-XL. -Changed warfarin to Eliquis due to labile INR and stroke   Bilateral carotid artery stenosis: CT angio head and neck as above. -Continue Crestor and Zetia as above -Continue Eliquis   History of CAD: No cardiopulmonary symptoms -Continue home meds   History of severe AS status post TAVR: Stable -Outpatient follow-up   CLL/history of prostate cancer: Follows urology and Dr. Bertis Ruddy -On yearly surveillance. -Outpatient follow-up     OSA not on CPAP -Monitor for desaturation.   Chronic COPD: Stable.  Quit smoking about 2 years ago. -Continue bronchodilators   Vitamin D deficiency: Seems she is taking high-dose vitamin D biweekly.  Vitamin D level 81.  Previously 113. -Discontinue multivitamin -Recheck in 6 months to 1 year           Time spent 35 minutes  Vital signs Vitals:   01/04/23 2345 01/05/23 0338 01/05/23 0803 01/05/23 1324  BP: 129/82 (!) 150/73 (!) 123/90 (!) 151/85  Pulse: 68 70 84 82  Temp: (!) 97.5 F (36.4 C) 98 F (36.7 C) 98.9 F (37.2 C) (!) 97.5 F (36.4 C)  Resp:   15 20  Height:  Weight:      SpO2: 100% 99% 100% 100%  TempSrc: Axillary Axillary Oral Oral  BMI (Calculated):         Discharge exam  GENERAL: No apparent distress.  Nontoxic. HEENT: MMM.  Vision and hearing grossly intact.  NECK: Supple.  No apparent JVD.  RESP:  No IWOB.  Fair aeration bilaterally. CVS: Irregular rhythm.  Normal rate.  ABD/GI/GU: BS+. Abd soft, NTND.  MSK/EXT:   Moves extremities. No apparent deformity. No edema.  SKIN: no apparent skin lesion or wound NEURO: Awake and alert. Oriented appropriately.  No apparent focal neuro deficit. PSYCH: Calm. Normal affect.   Discharge Instructions Discharge Instructions     Diet - low sodium heart healthy   Complete by: As directed    Increase activity slowly   Complete by: As directed       Allergies as of 01/05/2023       Reactions   Other Rash   Allergen: "Plants and bushes while doing yard work"   [employer] Extract Rash        Medication List     STOP taking these medications    ascorbic acid 1000 MG tablet Commonly known as: VITAMIN C   ascorbic acid 500 MG tablet Commonly known as: VITAMIN C   BIOTIN PO   Calcium Carb-Cholecalciferol 500-10 MG-MCG Tabs   Cholecalciferol 1.25 MG (50000 UT) capsule   docusate sodium 100 MG capsule Commonly known as: COLACE   Fish Oil 1000 MG Caps   ketoconazole 2 % shampoo Commonly known as: NIZORAL   LYCOPENE PO   Magnesium 250 MG Tabs   niacin 500 MG tablet Commonly known as: VITAMIN B3   Tums Ultra 1000 1000 MG chewable tablet Generic drug: calcium elemental as carbonate   vitamin B-12 500 MCG tablet Commonly known as: CYANOCOBALAMIN   Vitamin D (Ergocalciferol) 1.25 MG (50000 UNIT) Caps capsule Commonly known as: DRISDOL   VITAMIN-B COMPLEX PO   warfarin 4 MG tablet Commonly known as: COUMADIN       TAKE these medications    acetaminophen 325 MG tablet Commonly known as: TYLENOL Take 2 tablets (650 mg total) by mouth every 4 (four) hours as needed for mild pain (or temp > 37.5 C (99.5 F)).   albuterol 108 (90 Base) MCG/ACT inhaler Commonly known as: VENTOLIN HFA Inhale 2 puffs into the lungs every 6 (six) hours as needed for wheezing or shortness of breath.   amoxicillin 500 MG tablet Commonly known as: AMOXIL Take 4 tablets (2000 mg) by mouth ONE HOUR before any dental procedures.   apixaban 5 MG Tabs  tablet Commonly known as: ELIQUIS Take 1 tablet (5 mg total) by mouth 2 (two) times daily.   buPROPion 200 MG 12 hr tablet Commonly known as: WELLBUTRIN SR Take 1 tablet (200 mg total) by mouth every morning.   buPROPion ER 100 MG 12 hr tablet Commonly known as: Wellbutrin SR 1 qhs   ezetimibe 10 MG tablet Commonly known as: ZETIA Take 1 tablet (10 mg total) by mouth daily.   gabapentin 100 MG capsule Commonly known as: NEURONTIN 3  qhs What changed:  how much to take when to take this additional instructions   latanoprost 0.005 % ophthalmic solution Commonly known as: XALATAN Place 1 drop into the left eye every other day.   metFORMIN 500 MG tablet Commonly known as: GLUCOPHAGE Take 1 tablet (500 mg total) by mouth 2 (two) times daily with a meal.   metoprolol  succinate 50 MG 24 hr tablet Commonly known as: TOPROL-XL Take 1 tablet (50 mg total) by mouth 2 (two) times daily. Take with or immediately following a meal.   multivitamin with minerals tablet Take 1 tablet by mouth daily.   polyethylene glycol 17 g packet Commonly known as: MIRALAX / GLYCOLAX Take 17 g by mouth 2 (two) times daily as needed for mild constipation.   PREVIDENT 5000 DRY MOUTH DT Place 1 application onto teeth See admin instructions. Apply to gums every night for dry mouth   rosuvastatin 20 MG tablet Commonly known as: CRESTOR Take 1 tablet (20 mg total) by mouth daily.   senna-docusate 8.6-50 MG tablet Commonly known as: Senokot-S Take 1 tablet by mouth 2 (two) times daily as needed for moderate constipation.        Consultations: Neurology  Procedures/Studies:   ECHOCARDIOGRAM COMPLETE BUBBLE STUDY  Result Date: 01/02/2023    ECHOCARDIOGRAM REPORT   Patient Name:   Rodney Friedman Date of Exam: 01/02/2023 Medical Rec #:  [id number]       Height:       68.0 in Accession #:    [id number]      Weight:       210.3 lb Date of Birth:  [birth year]       BSA:          2.088 m Patient  Age:    77 years        BP:           151/77 mmHg Patient Gender: M               HR:           85 bpm. Exam Location:  Inpatient Procedure: 2D Echo, Color Doppler, Cardiac Doppler, Intracardiac Opacification            Agent and Saline Contrast Bubble Study Indications:    Stroke  History:        Patient has prior history of Echocardiogram examinations, most                 recent 01/27/2022. CAD, Stroke, Aortic Valve Disease and s/p TAVR,                 Arrythmias:Atrial Fibrillation; Risk Factors:Diabetes,                 Hypertension, Dyslipidemia and Sleep Apnea.  Sonographer:    Milbert Coulter Referring Phys: [id number] ALLISON L ELLIS IMPRESSIONS  1. Left ventricular ejection fraction, by estimation, is 55 to 60%. The left ventricle has normal function. The left ventricle demonstrates regional wall motion abnormalities (see scoring diagram/findings for description). There is severe asymmetric left ventricular hypertrophy of the basal-septal segment. Left ventricular diastolic parameters are indeterminate.  2. Right ventricular systolic function is normal. The right ventricular size is normal.  3. Left atrial size was severely dilated.  4. Right atrial size was severely dilated.  5. The mitral valve is degenerative. Mild to moderate mitral valve regurgitation. Mild mitral stenosis. The mean mitral valve gradient is 6.0 mmHg with average heart rate of 70 bpm. Severe mitral annular calcification.  6. The aortic valve has been repaired/replaced. Aortic valve regurgitation is not visualized. Echo findings are consistent with normal structure and function of the aortic valve prosthesis. Aortic valve mean gradient measures 13.0 mmHg.  7. The inferior vena cava is normal in size with greater than 50% respiratory variability, suggesting right atrial pressure of 3 mmHg.  8. Evidence  of atrial level shunting detected by color flow Doppler. Agitated saline contrast bubble study was negative, with no evidence of any interatrial  shunt. FINDINGS  Left Ventricle: Left ventricular ejection fraction, by estimation, is 55 to 60%. The left ventricle has normal function. The left ventricle demonstrates regional wall motion abnormalities. Definity contrast agent was given IV to delineate the left ventricular endocardial borders. The left ventricular internal cavity size was normal in size. There is severe asymmetric left ventricular hypertrophy of the basal-septal segment. Left ventricular diastolic parameters are indeterminate.  LV Wall Scoring: The inferior wall is hypokinetic. Right Ventricle: The right ventricular size is normal. No increase in right ventricular wall thickness. Right ventricular systolic function is normal. Left Atrium: Left atrial size was severely dilated. Right Atrium: Right atrial size was severely dilated. Pericardium: There is no evidence of pericardial effusion. Mitral Valve: The mitral valve is degenerative in appearance. Severe mitral annular calcification. Mild to moderate mitral valve regurgitation. Mild mitral valve stenosis. MV peak gradient, 11.1 mmHg. The mean mitral valve gradient is 6.0 mmHg with average heart rate of 70 bpm. Tricuspid Valve: The tricuspid valve is normal in structure. Tricuspid valve regurgitation is not demonstrated. No evidence of tricuspid stenosis. Aortic Valve: The aortic valve has been repaired/replaced. Aortic valve regurgitation is not visualized. Aortic valve mean gradient measures 13.0 mmHg. Aortic valve peak gradient measures 24.8 mmHg. Echo findings are consistent with normal structure and function of the aortic valve prosthesis. Pulmonic Valve: The pulmonic valve was not well visualized. Pulmonic valve regurgitation is not visualized. Aorta: The aortic root and ascending aorta are structurally normal, with no evidence of dilitation. Venous: The inferior vena cava is normal in size with greater than 50% respiratory variability, suggesting right atrial pressure of 3 mmHg.  IAS/Shunts: Evidence of atrial level shunting detected by color flow Doppler. Agitated saline contrast was given intravenously to evaluate for intracardiac shunting. Agitated saline contrast bubble study was negative, with no evidence of any interatrial shunt.  LEFT VENTRICLE PLAX 2D LVIDd:         3.90 cm LVIDs:         2.90 cm LV PW:         1.50 cm LV IVS:        1.40 cm  RIGHT VENTRICLE RV S prime:     14.40 cm/s TAPSE (M-mode): 1.7 cm LEFT ATRIUM            Index        RIGHT ATRIUM           Index LA diam:      6.20 cm  2.97 cm/m   RA Area:     28.00 cm LA Vol (A4C): 132.0 ml 63.22 ml/m  RA Volume:   87.90 ml  42.10 ml/m  AORTIC VALVE AV Vmax:           249.00 cm/s AV Vmean:          165.000 cm/s AV VTI:            0.469 m AV Peak Grad:      24.8 mmHg AV Mean Grad:      13.0 mmHg LVOT Vmax:         108.00 cm/s LVOT Vmean:        68.700 cm/s LVOT VTI:          0.197 m LVOT/AV VTI ratio: 0.42  AORTA Ao Asc diam: 3.80 cm MITRAL VALVE MV Area (PHT): 2.66 cm  SHUNTS MV Peak grad:  11.1 mmHg    Systemic VTI: 0.20 m MV Mean grad:  6.0 mmHg MV Vmax:       1.66 m/s MV Vmean:      114.3 cm/s MV Decel Time: 285 msec MV E velocity: 170.00 cm/s MV A velocity: 79.70 cm/s MV E/A ratio:  2.13 Riley Lam MD Electronically signed by Riley Lam MD Signature Date/Time: 01/02/2023/12:32:59 PM    Final    MR BRAIN WO CONTRAST  Result Date: 01/02/2023 CLINICAL DATA:  Slurred speech with right-sided weakness and facial droop EXAM: MRI HEAD WITHOUT CONTRAST TECHNIQUE: Multiplanar, multiecho pulse sequences of the brain and surrounding structures were obtained without intravenous contrast. COMPARISON:  Head CT and CTA from earlier today FINDINGS: Brain: Restricted diffusion in the left thalamus. Punctate restricted diffusion at the left occipital cortex. Mild chronic small vessel ischemia in the cerebral white matter for age. No acute hemorrhage, hydrocephalus, mass, or collection. Few chronic  microhemorrhages are seen in the left cerebral hemisphere with nonspecific pattern. Mild, generalized cerebral volume loss. Vascular: Major flow voids are preserved.  There was preceding CTA. Skull and upper cervical spine: No focal marrow lesion Sinuses/Orbits: Negative IMPRESSION: Acute lacunar infarct at the left thalamus. Tiny acute left occipital cortex infarct. Electronically Signed   By: Tiburcio Pea M.D.   On: 01/02/2023 05:45   DG Chest Portable 1 View  Result Date: 01/02/2023 CLINICAL DATA:  Right leg weakness, possible stroke EXAM: PORTABLE CHEST 1 VIEW COMPARISON:  11/03/2022 CT FINDINGS: Cardiac shadow is enlarged. Changes of prior TAVR are noted. Postsurgical changes are seen. The lungs are well aerated bilaterally. No focal infiltrate is seen. No bony abnormality is noted. IMPRESSION: No acute abnormality noted. Electronically Signed   By: Alcide Clever M.D.   On: 01/02/2023 02:49   CT ANGIO HEAD NECK W WO CM (CODE STROKE)  Result Date: 01/02/2023 CLINICAL DATA:  Initial evaluation for neuro deficit, stroke. EXAM: CT ANGIOGRAPHY HEAD AND NECK WITH AND WITHOUT CONTRAST TECHNIQUE: Multidetector CT imaging of the head and neck was performed using the standard protocol during bolus administration of intravenous contrast. Multiplanar CT image reconstructions and MIPs were obtained to evaluate the vascular anatomy. Carotid stenosis measurements (when applicable) are obtained utilizing NASCET criteria, using the distal internal carotid diameter as the denominator. RADIATION DOSE REDUCTION: This exam was performed according to the departmental dose-optimization program which includes automated exposure control, adjustment of the mA and/or kV according to patient size and/or use of iterative reconstruction technique. CONTRAST:  75mL OMNIPAQUE IOHEXOL 350 MG/ML SOLN COMPARISON:  CT from earlier the same day. FINDINGS: CTA NECK FINDINGS Aortic arch: Visualized arch within normal limits for caliber.  Moderate atheromatous change about the visualized arch. Origin of the great vessels incompletely visualized. No visible stenosis. Right carotid system: Right common and internal carotid arteries are patent without dissection. Moderate calcified plaque about the right carotid bulb without hemodynamically significant greater than 50% stenosis. Left carotid system: Left common and internal carotid arteries are patent without dissection. Moderate calcified plaque about the left carotid bulb with up to approximately 60% stenosis by NASCET criteria. Vertebral arteries: Both vertebral arteries arise from the subclavian arteries. No visible significant proximal subclavian artery stenosis. Vertebral arteries are patent without significant stenosis or dissection. Skeleton: No worrisome osseous lesions. Moderate to advanced spondylosis present at C5-6. Other neck: No other acute finding. Upper chest: No other acute finding. Review of the MIP images confirms the above findings CTA HEAD FINDINGS Anterior circulation:  Atheromatous change seen about the carotid siphons with associated up to moderate stenoses bilaterally. A1 segments patent bilaterally. Normal anterior communicating complex. Anterior cerebral arteries patent without stenosis. No M1 stenosis or occlusion. No proximal ECA branch occlusion or high-grade stenosis. Distal MCA branches perfused and symmetric. Posterior circulation: Both V4 segments widely patent. Right vertebral artery slightly dominant. Both PICA patent. Basilar widely patent without stenosis. Superior cerebellar and posterior cerebral arteries patent bilaterally. Venous sinuses: Patent allowing for timing the contrast bolus. Anatomic variants: As above.  No aneurysm. Review of the MIP images confirms the above findings IMPRESSION: 1. Negative CTA for large vessel occlusion or other emergent finding. 2. Moderate calcified plaque about the carotid bifurcations bilaterally. Resultant 60% stenosis on the  left and 40% stenosis on the right. 3. Atheromatous change about the carotid siphons with up to moderate stenoses bilaterally. Aortic Atherosclerosis (ICD10-I70.0). These results were communicated to Dr. Derry Lory at 1:30 am on 01/02/2023 by text page via the [facility] messaging system. Electronically Signed   By: Rise Mu M.D.   On: 01/02/2023 01:52   CT HEAD CODE STROKE WO CONTRAST  Result Date: 01/02/2023 CLINICAL DATA:  Code stroke. Initial evaluation for neuro deficit, stroke. EXAM: CT HEAD WITHOUT CONTRAST TECHNIQUE: Contiguous axial images were obtained from the base of the skull through the vertex without intravenous contrast. RADIATION DOSE REDUCTION: This exam was performed according to the departmental dose-optimization program which includes automated exposure control, adjustment of the mA and/or kV according to patient size and/or use of iterative reconstruction technique. COMPARISON:  None Available. FINDINGS: Brain: Mild age-related cerebral atrophy. No acute intracranial hemorrhage. No acute large vessel territory infarct. No mass lesion, midline shift or mass effect. No hydrocephalus or extra-axial fluid collection. Vascular: No abnormal hyperdense vessel. Scattered calcified atherosclerosis of skull base. Skull: Scalp soft tissues and calvarium within normal limits. Sinuses/Orbits: Globes and orbital soft tissues within normal limits. Paranasal sinuses and mastoid air cells are clear. Other: None. ASPECTS [facility] Stroke Program Early CT Score) - Ganglionic level infarction (caudate, lentiform nuclei, internal capsule, insula, M1-M3 cortex): 7 - Supraganglionic infarction (M4-M6 cortex): 3 Total score (0-10 with 10 being normal): 10 IMPRESSION: 1. No acute intracranial abnormality. 2. ASPECTS is 10. These results were communicated to Dr. Derry Lory at 1:30 am on 01/02/2023 by text page via the [facility] messaging system. Electronically Signed   By: Rise Mu M.D.   On: 01/02/2023  01:30       The results of significant diagnostics from this hospitalization (including imaging, microbiology, ancillary and laboratory) are listed below for reference.     Microbiology: No results found for this or any previous visit (from the past 240 hour(s)).   Labs:  CBC: Recent Labs  Lab 01/02/23 0115 01/02/23 0120 01/03/23 0321 01/05/23 0334  WBC 14.2*  --  13.4* 13.0*  NEUTROABS 7.1  --   --   --   HGB 14.9 15.0 14.2 15.2  HCT 44.1 44.0 41.5 43.2  MCV 89.3  --  89.6 90.6  PLT 175  --  152 122*   BMP &GFR Recent Labs  Lab 01/02/23 0115 01/02/23 0120 01/03/23 0321 01/05/23 0334  NA 139 142 140 139  K 3.9 4.0 4.3 4.0  CL 107 108 107 109  CO2 19*  --  22 19*  GLUCOSE 127* 122* 92 92  BUN 13 15 16 18   CREATININE 1.37* 1.30* 1.42* 1.03  CALCIUM 9.4  --  9.1 8.9  MG  --   --  1.7 1.6*  PHOS  --   --  4.1 3.6   Estimated Creatinine Clearance: 67.7 mL/min (by C-G formula based on SCr of 1.03 mg/dL). Liver & Pancreas: Recent Labs  Lab 01/02/23 0115 01/03/23 0321 01/05/23 0334  AST 31  --   --   ALT 25  --   --   ALKPHOS 35*  --   --   BILITOT 0.9  --   --   PROT 6.6  --   --   ALBUMIN 3.8 3.5 3.3*   No results for input(s): "LIPASE", "AMYLASE" in the last 168 hours. No results for input(s): "AMMONIA" in the last 168 hours. Diabetic: Recent Labs    01/03/23 0321  HGBA1C 6.8*   Recent Labs  Lab 01/04/23 1159 01/04/23 1649 01/04/23 2104 01/05/23 0627 01/05/23 1254  GLUCAP 112* 103* 182* 108* 170*   Cardiac Enzymes: No results for input(s): "CKTOTAL", "CKMB", "CKMBINDEX", "TROPONINI" in the last 168 hours. No results for input(s): "PROBNP" in the last 8760 hours. Coagulation Profile: Recent Labs  Lab 01/02/23 0115 01/03/23 0321 01/04/23 0013  INR 2.4* 2.9* 2.1*   Thyroid Function Tests: No results for input(s): "TSH", "T4TOTAL", "FREET4", "T3FREE", "THYROIDAB" in the last 72 hours. Lipid Profile: Recent Labs    01/03/23 0321  CHOL  88  HDL 32*  LDLCALC 28  TRIG 161  CHOLHDL 2.8   Anemia Panel: No results for input(s): "VITAMINB12", "FOLATE", "FERRITIN", "TIBC", "IRON", "RETICCTPCT" in the last 72 hours. Urine analysis:    Component Value Date/Time   COLORURINE YELLOW 01/02/2023 0748   APPEARANCEUR CLEAR 01/02/2023 0748   APPEARANCEUR Clear 02/09/2017 1238   LABSPEC 1.043 (H) 01/02/2023 0748   PHURINE 5.0 01/02/2023 0748   GLUCOSEU NEGATIVE 01/02/2023 0748   HGBUR NEGATIVE 01/02/2023 0748   BILIRUBINUR NEGATIVE 01/02/2023 0748   BILIRUBINUR Negative 02/09/2017 1238   KETONESUR NEGATIVE 01/02/2023 0748   PROTEINUR NEGATIVE 01/02/2023 0748   UROBILINOGEN 0.2 06/04/2014 1539   NITRITE NEGATIVE 01/02/2023 0748   LEUKOCYTESUR NEGATIVE 01/02/2023 0748   Sepsis Labs: Invalid input(s): "PROCALCITONIN", "LACTICIDVEN"   SIGNED:  Almon Hercules, MD  [facility] 01/05/2023, 1:52 PM

## 2023-01-05 NOTE — Progress Notes (Signed)
Physical Therapy Treatment Patient Details Name: Rodney Friedman MRN: [id number] DOB: 1946/05/15 Today's Date: 01/05/2023   History of Present Illness Pt is 77 yo male admitted on 01/02/23 with CVA - acute lacunar infarct of L thalamus and tiny acute left occipital cortex infarct.  Pt with hx including but not limited to CLL, prostate cancer, CAD, diabetes mellitus on metformin, chronic atrial fibrillation on warfarin, history of severe aortic stenosis status post TAVR, dyslipidemia and sleep apnea.    PT Comments  Progressing well towards acute rehab goals. Session initially focusing on transfers from recliner, followed by NMRE activities with intermittent min assist for balance, reduced UE support statically. Gait training with RW, mostly at min guard level however had one episode of Lt knee buckle and required min assist to correct. Reviewed LE exercises and encouraged routine performance between therapy visits. Eager to go to CIR. Patient will continue to benefit from skilled physical therapy services to further improve independence with functional mobility.     If plan is discharge home, recommend the following: A little help with walking and/or transfers;A little help with bathing/dressing/bathroom;Assistance with cooking/housework;Help with stairs or ramp for entrance   Can travel by private vehicle        Equipment Recommendations  None recommended by PT    Recommendations for Other Services Rehab consult     Precautions / Restrictions Precautions Precautions: Fall Precaution Comments: RUE and LE ataxia, Lt knee instability Restrictions Weight Bearing Restrictions: No     Mobility  Bed Mobility               General bed mobility comments: In recliner    Transfers Overall transfer level: Needs assistance Equipment used: Rolling walker (2 wheels) Transfers: Sit to/from Stand Sit to Stand: Contact guard assist           General transfer comment: CGA for safety  from recliner with cues for hand placement, stable once upright with RW for support. Cues for alignment and reaching back prior to sitting.    Ambulation/Gait Ambulation/Gait assistance: Min assist Gait Distance (Feet): 110 Feet Assistive device: Rolling walker (2 wheels) Gait Pattern/deviations: Decreased stride length, Step-through pattern, Knees buckling, Knee hyperextension - left, Shuffle, Trunk flexed, Staggering right, Festinating Gait velocity: decreased Gait velocity interpretation: <1.31 ft/sec, indicative of household ambulator   General Gait Details: Initially CGA level however had one episode of Lt knee buckling requiring min assist to correct. Reviewed safety and use with RW for support, proximity to device, larger steps and symmetry. Slow and shuffled, exacerbated with turning.   Stairs             Wheelchair Mobility     Tilt Bed    Modified Rankin (Stroke Patients Only) Modified Rankin (Stroke Patients Only) Pre-Morbid Rankin Score: No symptoms Modified Rankin: Moderately severe disability     Balance Overall balance assessment: Needs assistance Sitting-balance support: No upper extremity supported Sitting balance-Leahy Scale: Good     Standing balance support: Reliant on assistive device for balance, No upper extremity supported Standing balance-Leahy Scale: Fair                              Cognition Arousal: Alert Behavior During Therapy: WFL for tasks assessed/performed Overall Cognitive Status: Within Functional Limits for tasks assessed  General Comments: Impulsive at times. Aware of deficits but seems to have reduced awareness of limits and safety.        Exercises General Exercises - Lower Extremity Ankle Circles/Pumps: AROM, Both, 10 reps, Seated Quad Sets: Strengthening, Both, 10 reps, Seated Gluteal Sets: Strengthening, Both, 10 reps, Seated Hip ABduction/ADduction:  Strengthening, Both, 10 reps, Seated Hip Flexion/Marching: Strengthening, Both, 10 reps, Seated Toe Raises: Strengthening, Both, 10 reps, Standing Heel Raises: Strengthening, Both, 10 reps, Standing Mini-Sqauts: Strengthening, Both, 10 reps, Standing (BIL UE support) Other Exercises Other Exercises: Standing balance challenges with intermittent UE support and up to min assist: eyes open, eyes closed, romberg, semi-tandem 30 sec ea.    General Comments        Pertinent Vitals/Pain Pain Assessment Pain Assessment: Faces Faces Pain Scale: Hurts little more Pain Location: back, chronic Pain Descriptors / Indicators: Aching Pain Intervention(s): Monitored during session    Home Living                          Prior Function            PT Goals (current goals can now be found in the care plan section) Acute Rehab PT Goals Patient Stated Goal: return home PT Goal Formulation: With patient Time For Goal Achievement: 01/16/23 Potential to Achieve Goals: Good Progress towards PT goals: Progressing toward goals    Frequency    Min 1X/week      PT Plan      Co-evaluation              AM-PAC PT "6 Clicks" Mobility   Outcome Measure  Help needed turning from your back to your side while in a flat bed without using bedrails?: A Little Help needed moving from lying on your back to sitting on the side of a flat bed without using bedrails?: A Little Help needed moving to and from a bed to a chair (including a wheelchair)?: A Little Help needed standing up from a chair using your arms (e.g., wheelchair or bedside chair)?: A Little Help needed to walk in hospital room?: A Little Help needed climbing 3-5 steps with a railing? : A Lot 6 Click Score: 17    End of Session Equipment Utilized During Treatment: Gait belt Activity Tolerance: Patient tolerated treatment well Patient left: in chair;with call bell/phone within reach;with chair alarm set Nurse  Communication: Mobility status PT Visit Diagnosis: Other abnormalities of gait and mobility (R26.89);Hemiplegia and hemiparesis;Ataxic gait (R26.0) Hemiplegia - Right/Left: Right Hemiplegia - dominant/non-dominant: Dominant Hemiplegia - caused by: Cerebral infarction     Time: 1610-9604 PT Time Calculation (min) (ACUTE ONLY): 17 min  Charges:    $Gait Training: 8-22 mins PT General Charges $$ ACUTE PT VISIT: 1 Visit                     Kathlyn Sacramento, PT, DPT [facility]  Rehabilitation Services Physical Therapist Office: [phone] Website: [email or web address]    Berton Mount 01/05/2023, 2:37 PM

## 2023-01-05 NOTE — Progress Notes (Signed)
PMR Admission Coordinator Pre-Admission Assessment   Patient: Rodney Friedman is an 77 y.o., male MRN: [id number] DOB: 05-17-1946 Height: 5\' 8"  (172.7 cm) Weight: 90.4 kg   Insurance Information HMO:     PPO:      PCP:      IPA:      80/20:      OTHER:  PRIMARY: Medicare AB      Policy#: [id number]      Subscriber:  Phone#: Verified online    Fax#:  Pre-Cert#:       Employer:  Benefits:  Phone #:      Name:  Eff. Date: Parts A effective 01/21/2007  and B effective 08/21/2011 Deduct: $1632      Out of Pocket Max:  None      Life Max: N/A  CIR: 100%      SNF: 100 days Outpatient: 80%     Co-Pay: 20% Home Health: 100%      Co-Pay: none DME: 80%     Co-Pay: 20% Providers: patient's choice Providers: in network  SECONDARY: Si Gaul      Policy#:  [id number]       Financial Counselor:       Phone#:    The "Data Collection Information Summary" for patients in Inpatient Rehabilitation Facilities with attached "Privacy Act Statement-Health Care Records" was provided and verbally reviewed with: Pt   Emergency Contact Information Contact Information       Name Relation Home Work [place] Friend     [phone]         Other Contacts       Name Relation Home Work Mobile    [place],Carlos Son [phone]   [phone]    D'Agostino,Cristina Daughter     [phone]           Current Medical History  Patient Admitting Diagnosis: CVA History of Present Illness: Rodney Friedman is a 77 y.o. 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 Bertis Ruddy, prostate cancer history, chronic A-fib on warfarin, diabetes mellitus, hypertension, COPD, OSA, obesity, CAD, history of aortic stenosis status post TAVR who presented to [facility] 01/02/23 with right leg weakness and coordination abnormalities.  He also has dysarthria. CT head 8/13 with no acute abnormality.  CTA 8/13 negative for large vessel occlusion, positive for calcified plaque at carotid  bifurcations with stenosis bilaterally.  MRI brain indicated acute lacunar infarct of the left thalamus, tiny acute left occipital cortex infarct.  Echo with EF 55 to 60%, left ventricular regional wall motion abnormalities, left ventricular hypertrophy of the basal septal segment.  Patient's warfarin was changed to Eliquis. Pt. Seen by PT/OT and they recommend CIR to assist return to PLOF.    Complete NIHSS TOTAL: 3   Patient's medical record from [facility]  has been reviewed by the rehabilitation admission coordinator and physician.   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 Clifton James & Dr. Laneta Simmers   Sleep apnea     Substance abuse [facility])     Tremor, essential 09/22/2015          Has the patient had major surgery during 100 days prior to admission? No   Family History   family history includes Colon cancer in  his mother; Hypertension in his father; Ovarian cancer in his mother; Prostate cancer in his father; Uterine cancer in his mother.   Current Medications  Current Medications    Current Facility-Administered Medications:    acetaminophen (TYLENOL) tablet 650 mg, 650 mg, Oral, Q4H PRN **OR** acetaminophen (TYLENOL) 160 MG/5ML solution 650 mg, 650 mg, Per Tube, Q4H PRN **OR** acetaminophen (TYLENOL) suppository 650 mg, 650 mg, Rectal, Q4H PRN, Russella Dar, NP   albuterol (PROVENTIL) (2.5 MG/3ML) 0.083% nebulizer solution 2.5 mg, 2.5 mg, Inhalation, Q6H PRN, Russella Dar, NP   apixaban Everlene Balls) tablet 5 mg, 5 mg, Oral, BID, Meryl Dare, MD, 5 mg at 01/03/23 9528   buPROPion ER (WELLBUTRIN SR) 12 hr tablet 100 mg, 100 mg, Oral, QHS, Russella Dar, NP, 100 mg at 01/02/23 2108   buPROPion ER (WELLBUTRIN SR) 12 hr tablet 200 mg, 200 mg, Oral, q morning, Russella Dar, NP, 200 mg at 01/03/23 4132   ezetimibe (ZETIA) tablet 10 mg, 10 mg, Oral, Daily, Russella Dar, NP, 10 mg at 01/03/23 4401   gabapentin (NEURONTIN) capsule 300 mg, 300 mg, Oral, QHS, Russella Dar, NP, 300 mg at 01/02/23 2108   insulin aspart (novoLOG) injection 0-5 Units, 0-5 Units, Subcutaneous, QHS, Junious Silk L, NP   insulin aspart (novoLOG) injection 0-9 Units, 0-9 Units, Subcutaneous, TID WC, Russella Dar, NP, 2 Units at 01/03/23 1228   metoprolol tartrate (LOPRESSOR) injection 5 mg, 5 mg, Intravenous, Q4H PRN, Russella Dar, NP   rosuvastatin (CRESTOR) tablet 20 mg, 20 mg, Oral, Daily, Junious Silk L, NP, 20 mg at 01/03/23 0272   sodium chloride flush (NS) 0.9 % injection 3 mL, 3 mL, Intravenous, Once, Mesner, Barbara Cower, MD     Patients Current Diet:  Diet Order                  Diet Carb Modified Fluid consistency: Thin; Room service appropriate? Yes  Diet effective now                         Precautions / Restrictions Precautions Precautions: Fall Precaution Comments: RUE and LE  ataxia Restrictions Weight Bearing Restrictions: No    Has the patient had 2 or more  falls or a fall with injury in the past year? Yes   Prior Activity Level Community (5-7x/wk): Pt. active in the community PTA   Prior Functional Level Self Care: Did the patient need help bathing, dressing, using the toilet or eating? Independent   Indoor Mobility: Did the patient need assistance with walking from room to room (with or without device)? Independent   Stairs: Did the patient need assistance with internal or external stairs (with or without device)? Independent   Functional Cognition: Did the patient need help planning regular tasks such as shopping or remembering to take medications? Independent   Patient Information Are you of Hispanic, Latino/a,or Spanish origin?: A. No, not of Hispanic, Latino/a, or Spanish origin What is your race?: A. White Do you need or want an interpreter to communicate with a doctor or health care staff?: 0. No   Patient's Response To:  Health Literacy and Transportation Is the patient able to respond to health literacy and transportation needs?: No Health Literacy - How often do you need to have someone help you when you read instructions, pamphlets, or other written material from your doctor or pharmacy?: Never In the past 12 months, has lack of transportation kept you from medical appointments or from getting medications?: No In the past 12 months, has lack of transportation kept you from meetings, work, or from getting things needed for daily living?: No   Journalist, newspaper / Equipment Home Assistive Devices/Equipment: None Home Equipment: Grab bars - tub/shower, Shower seat, Agricultural consultant (2 wheels), [employer] - single point, Transport chair   Prior Device Use: Indicate devices/aids used by the patient prior to current illness, exacerbation or injury? None of the above   Current Functional Level Cognition   Arousal/Alertness: Awake/alert Overall  Cognitive Status: Within Functional Limits for tasks assessed Orientation Level: Oriented X4 General Comments: Cognitively intact but exhibits some decreased anticipatory awareness with concerns of needing to get back home as soon as possible to pay bills. Attention: Focused, Sustained Focused Attention: Appears intact Sustained Attention: Appears intact Memory: Impaired Memory Impairment: Decreased short term memory Awareness: Appears intact Problem Solving: Appears intact Executive Function: Reasoning Reasoning: Appears intact Safety/Judgment: Appears intact    Extremity Assessment (includes Sensation/Coordination)   Upper Extremity Assessment: RUE deficits/detail RUE Deficits / Details: Strength 4/5 throughout.  Pt with increased dysmetria with finger to nose, unable to tie his gown in the front secondary to ataxia and decreased FM coordination. RUE Sensation: WNL RUE Coordination: decreased fine motor, decreased gross motor LUE Deficits / Details: LUE strength WFLs, slight tremor noted which patient reports that he has a history of. LUE Coordination: decreased gross motor  Lower Extremity Assessment: Defer to PT evaluation RLE Deficits / Details: ROM WFL; MMT 5/5 ankle, 5/5 knee ext, 5/5 hip flex RLE Sensation: WNL RLE Coordination: decreased gross motor (slight decrease with heel/shin, and mild ataxia) LLE Deficits / Details: ROM WFL; MMT 5/5 LLE Sensation: WNL     ADLs   Overall ADL's : Needs assistance/impaired Eating/Feeding: Set up, Sitting Grooming: Wash/dry hands, Wash/dry face, Contact guard assist, Standing Upper Body Bathing: Set up, Sitting Upper Body Bathing Details (indicate cue type and reason): simulated Lower Body Bathing: Minimal assistance, Sit to/from stand Upper Body Dressing : Minimal assistance, Sitting Upper Body Dressing Details (indicate cue type and reason): assist for tying gown in the front Lower Body Dressing: Minimal assistance, Sit to/from  stand Toilet Transfer: Minimal assistance, Ambulation Toilet Transfer Details (indicate cue type and reason): min assist  without use of an assistive device, min guard with use Toileting- Clothing Manipulation and Hygiene: Minimal assistance, Sit to/from stand Functional mobility during ADLs: Minimal assistance (hand held assist on the right and pt holding IV pole on the left) General ADL Comments: Pt with increased ataxia noted with RUE use and in the RLE with standing and mobility.  Needs UE support during standing and mobility for safety.  Pt lives alone and was driving as needed to appointments.  Son lives in the area but is not able to assist consistently.     Mobility   Overal bed mobility: Needs Assistance Bed Mobility: Supine to Sit, Sit to Supine Supine to sit: HOB elevated, Supervision Sit to supine: Supervision General bed mobility comments: Supervision for safety, requires extra time to get LEs to EOB but did not require physical assist.     Transfers   Overall transfer level: Needs assistance Equipment used: Rolling walker (2 wheels) Transfers: Sit to/from Stand Sit to Stand: Contact guard assist Bed to/from chair/wheelchair/BSC transfer type:: Step pivot Step pivot transfers: Min assist General transfer comment: Close guard for safety. Slow and effortful rise, cues for technique, leans towards Rt but able to stabilize once upright with BIL support on RW.     Ambulation / Gait / Stairs / Wheelchair Mobility   Ambulation/Gait Ambulation/Gait assistance: Editor, commissioning (Feet): 80 Feet Assistive device: Rolling walker (2 wheels), None Gait Pattern/deviations: Decreased stride length, Step-through pattern, Knees buckling, Knee hyperextension - left, Shuffle, Ataxic, Trunk flexed, Staggering right General Gait Details: Reviewed safe AD use with RW for support. Slow and shuffled, Lt knee unstable with intermittent hyperextension. Minimal buckling of RLE, no overt LOB with  RW for support, cues for upright posture, proximity to walker, and awareness. Without AD pt requires min assist for LOB, and drifting towards Rt. Gait velocity: decreased Gait velocity interpretation: <1.31 ft/sec, indicative of household ambulator     Posture / Balance Balance Overall balance assessment: Needs assistance Sitting-balance support: No upper extremity supported Sitting balance-Leahy Scale: Good Standing balance support: Reliant on assistive device for balance, No upper extremity supported Standing balance-Leahy Scale: Fair Standing balance comment: Unsteady but able to stand briefly with poor posture, slight Rt lean, no UE, CGA.     Special needs/care consideration Special service needs none    Previous Home Environment (from acute therapy documentation) Living Arrangements: Alone  Lives With: Alone Available Help at Discharge: Family, Available PRN/intermittently, Friend(s) Type of Home: House Home Layout: One level Home Access: Stairs to enter Entrance Stairs-Rails: Right, Left Entrance Stairs-Number of Steps: 4 Bathroom Shower/Tub: Psychologist, counselling, Engineer, manufacturing systems: Handicapped height Bathroom Accessibility: Yes Home Care Services: No Additional Comments: Pt reports son is single father and not able to assist much.  States he has a couple friends from AA could help occasionally.   Discharge Living Setting Plans for Discharge Living Setting: Patient's home Type of Home at Discharge: House Discharge Home Layout: One level Discharge Home Access: Stairs to enter Entrance Stairs-Rails: Left, Right Entrance Stairs-Number of Steps: 4 Discharge Bathroom Shower/Tub: Tub/shower unit, Walk-in shower Discharge Bathroom Toilet: Handicapped height Discharge Bathroom Accessibility: Yes How Accessible: Accessible via walker Does the patient have any problems obtaining your medications?: No   Social/Family/Support Systems Contact Information: Pt. states son  cannot assist, he needs mod I goals. Caregiver Availability: Other (Comment) Discharge Plan Discussed with Primary Caregiver: No   Goals Patient/Family Goal for Rehab: PT/OT Mod I Expected length of stay: 5-7 days Pt/Family  Agrees to Admission and willing to participate: Yes Program Orientation Provided & Reviewed with Pt/Caregiver Including Roles  & Responsibilities: Yes   Decrease burden of Care through IP rehab admission: not anticiapted    Possible need for SNF placement upon discharge: not anticipated   Patient Condition: I have reviewed medical records from [facility], spoken with CM, and patient. I met with patient at the bedside for inpatient rehabilitation assessment.  Patient will benefit from ongoing PT and OT, can actively participate in 3 hours of therapy a day 5 days of the week, and can make measurable gains during the admission.  Patient will also benefit from the coordinated team approach during an Inpatient Acute Rehabilitation admission.  The patient will receive intensive therapy as well as Rehabilitation physician, nursing, social worker, and care management interventions.  Due to safety, skin/wound care, disease management, medication administration, pain management, and patient education the patient requires 24 hour a day rehabilitation nursing.  The patient is currently min A to contact guard  with mobility and basic ADLs.  Discharge setting and therapy post discharge at home with home health is anticipated.  Patient has agreed to participate in the Acute Inpatient Rehabilitation Program and will admit today.   Preadmission Screen Completed By:  Jeronimo Greaves, 01/03/2023 3:03 PM ______________________________________________________________________   Discussed status with Dr. Riley Kill on 01/05/23 at 930 and received approval for admission today.   Admission Coordinator:  Jeronimo Greaves, CCC-SLP, time 1350/Date 01/05/23    Assessment/Plan: Diagnosis: left  thalamic and occipital infarcts Does the need for close, 24 hr/day Medical supervision in concert with the patient's rehab needs make it unreasonable for this patient to be served in a less intensive setting? Yes Co-Morbidities requiring supervision/potential complications: CLL, COPD, OSA, CAD/AS s/p TAVR Due to bladder management, bowel management, safety, skin/wound care, disease management, medication administration, pain management, and patient education, does the patient require 24 hr/day rehab nursing? Yes Does the patient require coordinated care of a physician, rehab nurse, PT, OT, and SLP to address physical and functional deficits in the context of the above medical diagnosis(es)? Yes Addressing deficits in the following areas: balance, endurance, locomotion, strength, transferring, bowel/bladder control, bathing, dressing, feeding, grooming, toileting, and psychosocial support Can the patient actively participate in an intensive therapy program of at least 3 hrs of therapy 5 days a week? Yes The potential for patient to make measurable gains while on inpatient rehab is excellent Anticipated functional outcomes upon discharge from inpatient rehab: modified independent PT, modified independent OT, n/a SLP Estimated rehab length of stay to reach the above functional goals is: 5-7 days Anticipated discharge destination: Home 10. Overall Rehab/Functional Prognosis: excellent     MD Signature: Ranelle Oyster, MD, [facility] Physical Medicine & Rehabilitation Medical Director Rehabilitation Services 01/05/2023

## 2023-01-06 ENCOUNTER — Encounter (HOSPITAL_COMMUNITY): Payer: Self-pay | Admitting: Physical Medicine & Rehabilitation

## 2023-01-06 LAB — GLUCOSE, CAPILLARY
Glucose-Capillary: 100 mg/dL — ABNORMAL HIGH (ref 70–99)
Glucose-Capillary: 106 mg/dL — ABNORMAL HIGH (ref 70–99)
Glucose-Capillary: 111 mg/dL — ABNORMAL HIGH (ref 70–99)
Glucose-Capillary: 74 mg/dL (ref 70–99)
Glucose-Capillary: 98 mg/dL (ref 70–99)

## 2023-01-06 NOTE — Plan of Care (Signed)
  Problem: RH Balance Goal: LTG Patient will maintain dynamic standing balance (PT) Description: LTG:  Patient will maintain dynamic standing balance with assistance during mobility activities (PT) Flowsheets (Taken 01/06/2023 1731) LTG: Pt will maintain dynamic standing balance during mobility activities with:: Independent with assistive device    Problem: Sit to Stand Goal: LTG:  Patient will perform sit to stand with assistance level (PT) Description: LTG:  Patient will perform sit to stand with assistance level (PT) Flowsheets (Taken 01/06/2023 1731) LTG: PT will perform sit to stand in preparation for functional mobility with assistance level: Independent with assistive device   Problem: RH Bed Mobility Goal: LTG Patient will perform bed mobility with assist (PT) Description: LTG: Patient will perform bed mobility with assistance, with/without cues (PT). Flowsheets (Taken 01/06/2023 1731) LTG: Pt will perform bed mobility with assistance level of: Independent with assistive device    Problem: RH Bed to Chair Transfers Goal: LTG Patient will perform bed/chair transfers w/assist (PT) Description: LTG: Patient will perform bed to chair transfers with assistance (PT). Flowsheets (Taken 01/06/2023 1731) LTG: Pt will perform Bed to Chair Transfers with assistance level: Independent with assistive device    Problem: RH Car Transfers Goal: LTG Patient will perform car transfers with assist (PT) Description: LTG: Patient will perform car transfers with assistance (PT). Flowsheets (Taken 01/06/2023 1731) LTG: Pt will perform car transfers with assist:: Supervision/Verbal cueing   Problem: RH Furniture Transfers Goal: LTG Patient will perform furniture transfers w/assist (OT/PT) Description: LTG: Patient will perform furniture transfers  with assistance (OT/PT). Flowsheets (Taken 01/06/2023 1731) LTG: Pt will perform furniture transfers with assist:: Independent with assistive device     Problem: RH Ambulation Goal: LTG Patient will ambulate in controlled environment (PT) Description: LTG: Patient will ambulate in a controlled environment, # of feet with assistance (PT). Flowsheets (Taken 01/06/2023 1731) LTG: Pt will ambulate in controlled environ  assist needed:: Independent with assistive device LTG: Ambulation distance in controlled environment: up to 200 ft using LRAD with improved coordination Goal: LTG Patient will ambulate in home environment (PT) Description: LTG: Patient will ambulate in home environment, # of feet with assistance (PT). Flowsheets (Taken 01/06/2023 1731) LTG: Pt will ambulate in home environ  assist needed:: Independent with assistive device LTG: Ambulation distance in home environment: up to 50 ft using LRAD   Problem: RH Stairs Goal: LTG Patient will ambulate up and down stairs w/assist (PT) Description: LTG: Patient will ambulate up and down # of stairs with assistance (PT) Flowsheets (Taken 01/06/2023 1731) LTG: Pt will ambulate up/down stairs assist needed:: Independent with assistive device LTG: Pt will  ambulate up and down number of stairs: at least 4 steps with HR setup as per home environment in order to enter/ exit home

## 2023-01-06 NOTE — Progress Notes (Signed)
Physical Therapy Session Note  Patient Details  Name: Rodney Friedman MRN: [id number] Date of Birth: [birth year]  Today's Date: 01/06/2023 PT Individual Time: 4098-1191 PT Individual Time Calculation (min): 64 min   Short Term Goals: Week 1:  PT Short Term Goal 1 (Week 1): pt will perform sup>sit on EOB with Mod I in under . PT Short Term Goal 2 (Week 1): Pt will perform standing transfers with consistant distant supervision to no AD. PT Short Term Goal 3 (Week 1): Pt will ambulate without RW with overall supervision and improved smoothness of gait cycle. PT Short Term Goal 4 (Week 1): Pt will perform stairs with overall distant supervision. PT Short Term Goal 5 (Week 1): Pt will complete Berg Balance test for additional outcome measure.  Skilled Therapeutic Interventions/Progress Updates:  Patient supine in bed on entrance to room. Patient alert and agreeable to PT session.   Patient with no pain complaint at start of session.  Therapeutic Activity: Bed Mobility: Pt performed supine > sit with supervision/ CGA and requires extra time for coordinated scoot up to EOB. VC/ tc required for improved technique. Transfers: Pt performed sit<>stand and stand pivot transfers throughout session with supervision/ CGA. Provided verbal cues for improving forward lean with hand placement on seated surface and forward push to improve weight shift over feet.   Gait Training/ NMR:  Pt ambulated to day room and back using RW with CGA for ataxic movements with RLE.  Demonstrated short steps with picking up of RW requiring pause in steps. VC tomaintain RW placement on floor and forward push with decreased BUE support into walker for improved mgmt.   Gait and balance training starting with TUG performed with no AD. TUG completed in 36.9 seconds (A score of >13.5 seconds indicates patient is at a high fall risk. Pt educated on interpretation of their score). Provided vc/ tc for improving step height/ length  with increased conscious practice to movement prior to and during ambulation.  Guided in ambulation without RW through narrow turns around cones and noted staccato stepping around cones with improved step length between cones. Next guided in step over of hurdles with pause for balance challenge prior to full completion of hurdle. Education provided on need for balance shift from back to front foot in order to place desired foot in desired location with improved balance in single leg stance. Pt fearful at first and completes well with cues throughout. No LOB but one circumduction step d/t FOF. Quality improves throughout.   Pt's gait continues to demo short near shuffle steps initially with each gait bout but is able to correct with vc and demos smoother movement patterns throughout.   NMR performed for improvements in motor control and coordination, balance, sequencing, judgement, and self confidence/ efficacy in performing all aspects of mobility at highest level of independence.   Patient seated on edge of bed at end of session with brakes locked, bed alarm set, and all needs within reach. Lunch tray arriving with NT on completion of session.    Therapy Documentation Precautions:  Precautions Precautions: Fall Precaution Comments: RUE and RLE ataxia, L quad pain from fall Restrictions Weight Bearing Restrictions: No  Pain: Pain Assessment Pain Scale: 0-10 Pain Score: 0-No pain related this session.   Therapy/Group: Individual Therapy  Loel Dubonnet PT, DPT, CSRS 01/06/2023, 1:47 PM

## 2023-01-06 NOTE — Evaluation (Signed)
Occupational Therapy Assessment and Plan  Patient Details  Name: Rodney Friedman MRN: [id number] Date of Birth: [birth year]  OT Diagnosis: hemiplegia affecting dominant side and muscle weakness (generalized) Rehab Potential: Rehab Potential (ACUTE ONLY): Good ELOS: 10-14 days   Today's Date: 01/06/2023 OT Individual Time: 1300-1430       Friedman Problem: Principal Problem:   Thalamic infarction Rodney Friedman)   Past Medical History:  Past Medical History:  Diagnosis Date   Adenomatous colon polyp    Alcoholism (HCC)    Anxiety    Atrial fibrillation (HCC)    CAD (coronary artery disease)    Cataract    removed left eye    CHF (congestive heart failure) (HCC) 12/16/2020   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   s/p TAVR with a 23mm Edwards S3U via the TF approach by Rodney Friedman & Rodney Friedman   Sleep apnea    Substance abuse (HCC)    Tremor, essential 09/22/2015   Past Surgical History:  Past Surgical History:  Procedure Laterality Date   Arm Surgery Right    from door accident with glass   CARDIAC CATHETERIZATION     CATARACT EXTRACTION W/ INTRAOCULAR LENS IMPLANT Bilateral    COLONOSCOPY     CORONARY ARTERY BYPASS GRAFT  08/04/2004   EYE SURGERY     POLYPECTOMY      RIGHT/LEFT HEART CATH AND CORONARY/GRAFT ANGIOGRAPHY N/A 01/20/2021   Procedure: RIGHT/LEFT HEART CATH AND CORONARY/GRAFT ANGIOGRAPHY;  Surgeon: Rodney Hazel, MD;  Location: [facility] INVASIVE CV LAB;  Service: Cardiovascular;  Laterality: N/A;   TEE WITHOUT CARDIOVERSION N/A 02/08/2021   Procedure: TRANSESOPHAGEAL ECHOCARDIOGRAM (TEE);  Surgeon: Rodney Hazel, MD;  Location: [facility] INVASIVE CV LAB;  Service: Open Heart Surgery;  Laterality: N/A;   TRANSCATHETER AORTIC VALVE REPLACEMENT, CAROTID Left 02/08/2021   Procedure: TRANSCATHETER AORTIC VALVE REPLACEMENT, LEFT CAROTID;  Surgeon: Rodney Hazel, MD;  Location: [facility] INVASIVE CV LAB;  Service: Open Heart Surgery;  Laterality: Left;    Assessment & Plan Clinical Impression: Rodney Friedman is a 77 year old male who presented to [facility] ED as code stroke on 01/02/2023. He complained of right-sided weakness and facial droop. CT head without any obvious bleed. Maintained on Coumadin for a. fib and was therapeutic. He did not get tenecteplase because of being outside the window and being on Coumadin. No large vessel occlusion on CT. 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 Tiny acute left occipital cortex infarct. Rodney Friedman, patient's cardiologist was contacted by Dr. Pearlean Brownie and she stated patient was on warfarin for cost issues. [name] he can switch to  Eliquis. Echo with EF ~50-55%, aortic dilation s/p TAVR. Tolerating carb modified diet. No reports of chest pain or palpitations. Perforemd transfer from multiple surfaces 8/15 with PT with CGA for safety, cures for rail use. CGA to ambulate with RW. Up to min assist for gait without RW. The patient requires inpatient medicine and rehabilitation evaluations and services for ongoing dysfunction secondary to left thalamus infarct.   Patient transferred to CIR on 01/05/2023 .    Patient currently requires  min assist- supervision  with basic  self-care skills and IADL secondary to muscle weakness, decreased cardiorespiratoy endurance, and decreased standing balance, hemiplegia, and decreased balance strategies.  Prior to hospitalization, patient could complete BADLs and IADLs with independent .  Patient will benefit from skilled intervention to increase independence with basic self-care skills and increase level of independence with iADL prior to discharge home independently.  Anticipate patient will require  intermittent assist for commuting outside of home  and  follow-up to be determined .  OT - End of Session Activity Tolerance: Decreased this session Endurance Deficit: Yes OT Assessment Rehab Potential (ACUTE ONLY): Good OT Patient demonstrates impairments in the following area(s): Balance;Perception;Safety;Cognition;Sensory;Endurance;Motor OT Basic ADL's Functional Problem(s): Grooming;Bathing;Dressing;Toileting OT Advanced ADL's Functional Problem(s): Simple Meal Preparation;Laundry OT Transfers Functional Problem(s): Toilet;Tub/Shower OT Additional Impairment(s): None OT Plan OT Intensity: Minimum of 1-2 x/day, 45 to 90 minutes OT Frequency: 5 out of 7 days OT Duration/Estimated Length of Stay: 10-14 days OT Treatment/Interventions: Balance/vestibular training;Disease mangement/prevention;Community reintegration;Functional electrical stimulation;Neuromuscular re-education;Patient/family education;Splinting/orthotics;Self Care/advanced ADL retraining;Therapeutic Exercise;UE/LE Coordination activities;Wheelchair propulsion/positioning;UE/LE Strength taining/ROM;Therapeutic Activities;Pain management;Functional mobility training;DME/adaptive equipment instruction;Discharge planning;Cognitive remediation/compensation;Skin care/wound managment;Psychosocial support OT Self Feeding Anticipated Outcome(s): N/A OT Basic Self-Care Anticipated Outcome(s): Mod I OT Toileting Anticipated Outcome(s): Mod I OT Bathroom Transfers  Anticipated Outcome(s): Mod I OT Recommendation Patient destination: Home Follow Up Recommendations: Other (comment) (TBD) Equipment Details: TBD- likley shower chair   OT Evaluation Precautions/Restrictions  Precautions Precautions: Fall Precaution Comments: RUE and RLE ataxia, L quad pain from fall Restrictions Weight Bearing Restrictions: No General Chart Reviewed: Yes Vital Signs   Pain Pain Assessment Pain Scale: 0-10 Pain Score: 0-No pain Home Living/Prior Functioning Home Living Family/patient expects to be discharged to:: Private residence Living Arrangements: Alone Available Help at Discharge: Family, Available PRN/intermittently, Friend(s) Type of Home: House Home Access: Stairs to enter [employer] of Steps: 4 Entrance Stairs-Rails: Right, Left Home Layout: One level Bathroom Shower/Tub: Psychologist, counselling, Engineer, manufacturing systems: Handicapped height Bathroom Accessibility: Yes Additional Comments: Pt reports son is single father and not able to assist much.  States he has a couple friends from AA could help occasionally.  Lives With: Alone Prior Function Level of Independence: Independent with basic ADLs, Independent with homemaking with wheelchair  Able to Take Stairs?: Reciprically Driving: Yes Vocation: Retired Administrator, sports Baseline Vision/History: 1 Wears glasses (scarring of R macula) Ability to See in Adequate Light: 0 Adequate Patient Visual Report: No change from baseline Vision Assessment?: Yes Eye Alignment: Within Functional Limits Ocular Range of Motion: Within Functional Limits Alignment/Gaze Preference: Within Defined Limits Tracking/Visual Pursuits: Able to track stimulus in all quads without difficulty Saccades: Within functional limits Convergence: Impaired (comment) Visual Fields: No apparent deficits Depth Perception: Overshoots;Undershoots Additional Comments: Self 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 Takes drops to prevent glaucoma in L eye. Perception  Perception: Within Functional Limits Praxis Praxis: WFL Cognition Cognition Overall Cognitive Status: Within Functional Limits for tasks assessed Arousal/Alertness: Awake/alert Orientation Level: Person;Place;Situation Person: Oriented  Place: Oriented Situation: Oriented Memory: Impaired Attention: Sustained Sustained Attention: Appears intact Awareness: Appears intact Problem Solving: Appears intact Executive Function: Reasoning Reasoning: Appears intact Safety/Judgment: Appears intact Brief Interview for Mental Status (BIMS) Repetition of Three Words (First Attempt): 3 Temporal Orientation: Year: Correct Temporal Orientation: Month: Accurate within 5 days Temporal Orientation: Day: Correct Recall: "Sock": Yes, no cue required Recall: "Blue": Yes, no cue required Recall: "Bed": Yes, no cue required BIMS Summary Score: 15 Sensation Sensation Light Touch: Appears Intact Hot/Cold: Appears Intact Coordination Gross Motor Movements are Fluid and Coordinated: No Fine Motor Movements are Fluid and Coordinated: No Finger Nose Finger Test: mild over and under-shooting with RUE Heel Shin Test: [facility] with slight increase in coordination impairment (mild ataxia) with RLE 9 Hole Peg Test: R: 57 sec    L: 50 sec Motor  Motor Motor: Ataxia Motor - Skilled Clinical Observations: mild ataxia  Trunk/Postural Assessment  Cervical Assessment Cervical Assessment: Exceptions to [facility] (slightly forward head) Thoracic Assessment Thoracic Assessment: Exceptions to [facility] (rounded shoulders) Lumbar Assessment Lumbar Assessment: Exceptions to Rodney Friedman (posterior pelvic tilt; stenosis) Postural Control Postural Control: Within Functional Limits  Balance Balance Balance Assessed: Yes Standardized Balance Assessment Standardized Balance Assessment: Timed Up and Go Test Timed Up and Go Test TUG: Normal TUG Normal TUG  (seconds): 36.9 Static Sitting Balance Static Sitting - Balance Support: Bilateral upper extremity supported;Feet supported Static Sitting - Level of Assistance: 6: Modified independent (Device/Increase time);7: Independent Dynamic Sitting Balance Dynamic Sitting - Balance Support: Bilateral upper extremity supported;Feet supported Dynamic Sitting - Level of Assistance: 5: Stand by assistance Static Standing Balance Static Standing - Balance Support: Bilateral upper extremity supported Static Standing - Level of Assistance: 5: Stand by assistance Dynamic Standing Balance Dynamic Standing - Balance Support: Bilateral upper extremity supported;During functional activity Dynamic Standing - Level of Assistance: Other (comment) (CGA) Extremity/Trunk Assessment RUE Assessment RUE Assessment: Within Functional Limits Active Range of Motion (AROM) Comments: WFL General Strength Comments: 4/5 LUE Assessment LUE Assessment: Within Functional Limits General Strength Comments: 4/5  Care Tool Care Tool Self Care Eating   Eating Assist Level: Set up assist    Oral Care         Bathing   Body parts bathed by patient: Right arm;Left upper leg;Right lower leg;Left arm;Left lower leg;Chest;Abdomen;Front perineal area;Face;Buttocks;Right upper leg     Assist Level: Supervision/Verbal cueing    Upper Body Dressing(including orthotics)   What is the patient wearing?: Pull over shirt   Assist Level: Set up assist    Lower Body Dressing (excluding footwear)   What is the patient wearing?: Underwear/pull up;Pants Assist for lower body dressing: Minimal Assistance - Patient > 75%    Putting on/Taking off footwear   What is the patient wearing?: Socks Assist for footwear: Minimal Assistance - Patient > 75%       Care Tool Toileting Toileting activity Toileting Activity did not occur (Clothing management and hygiene only): N/A (no void or bm)       Care Tool Bed Mobility Roll left and  right activity   Roll left and right assist level: Supervision/Verbal cueing    Sit to lying activity   Sit to lying assist level: Supervision/Verbal cueing    Lying to sitting on side of bed activity   Lying to sitting on side of bed assist level: the ability to move from lying on the back to sitting on the side of the bed with no back support.: Contact Guard/Touching assist  Care Tool Transfers Sit to stand transfer   Sit to stand assist level: Contact Guard/Touching assist    Chair/bed transfer   Chair/bed transfer assist level: Contact Guard/Touching assist     Toilet transfer   Assist Level: Contact Guard/Touching assist     Care Tool Cognition  Expression of Ideas and Wants Expression of Ideas and Wants: 4. Without difficulty (complex and basic) - expresses complex messages without difficulty and with speech that is clear and easy to understand  Understanding Verbal and Non-Verbal Content Understanding Verbal and Non-Verbal Content: 4. Understands (complex and basic) - clear comprehension without cues or repetitions   Memory/Recall Ability Memory/Recall Ability : Current season;Staff names and faces;That he or she is in a Friedman/Friedman unit;Location of own room   Refer to Care Plan for Long Term Goals  SHORT TERM GOAL WEEK 1 OT Short Term Goal 1 (Week 1): Pt will complete LB dressing with supervision. OT Short Term Goal 2 (Week 1): Pt will complete toileting task with supervision. OT Short Term Goal 3 (Week 1): Pt will ambulate in room to retrieve items for ADLs with supevision.  Recommendations for other services: None    Skilled Therapeutic Intervention OT evaluation completed. Discussed role of OT, safety plan, fall risk, therapy schedule, pt's goals, therapy goals, etc with good acceptance. Pt completed bathing at shower level with setup assist and supervision with sit<>stand using grab bars. Completed functional mobility with CGA using RW and cues for pacing.  Pt demonstrated mild SOB during ADLs and functional mobility requiring cues to slow pace. Pt demonstrates mild ataxia on R side impacting GM and FM skills. At end of session pt left sitting EOB with bed alarm on and RN notified.   ADL ADL Eating: Set up Grooming: Not assessed Upper Body Bathing: Setup Where Assessed-Upper Body Bathing: Shower Lower Body Bathing: Setup;Supervision/safety Where Assessed-Lower Body Bathing: Shower Upper Body Dressing: Setup Where Assessed-Upper Body Dressing: Edge of bed Lower Body Dressing: Minimal assistance Where Assessed-Lower Body Dressing: Sitting at sink Toileting: Not assessed Toilet Transfer: Furniture conservator/restorer Method: Ambulating Tub/Shower Transfer: Not assessed Film/video editor: Administrator, arts Method: Designer, industrial/product: Grab bars;Shower seat with back Mobility  Bed Mobility Bed Mobility: Supine to Sit;Sit to Supine;Rolling Left;Rolling Right Rolling Right: Supervision/verbal cueing Rolling Left: Supervision/Verbal cueing Supine to Sit: Supervision/Verbal cueing Sit to Supine: Supervision/Verbal cueing Transfers Sit to Stand: Contact Guard/Touching assist Stand to Sit: Contact Guard/Touching assist   Discharge Criteria: Patient will be discharged from OT if patient refuses treatment 3 consecutive times without medical reason, if treatment goals not met, if there is a change in medical status, if patient makes no progress towards goals or if patient is discharged from Friedman.  The above assessment, treatment plan, treatment alternatives and goals were discussed and mutually agreed upon: by patient  Daneil Dan 01/06/2023, 2:20 PM

## 2023-01-06 NOTE — Plan of Care (Signed)
?  Problem: RH BOWEL ELIMINATION ?Goal: RH STG MANAGE BOWEL WITH ASSISTANCE ?Description: STG Manage Bowel with mod I Assistance. ?Outcome: Progressing ?Goal: RH STG MANAGE BOWEL W/MEDICATION W/ASSISTANCE ?Description: STG Manage Bowel with Medication with mod I  Assistance. ?Outcome: Progressing ?  ?Problem: RH SAFETY ?Goal: RH STG ADHERE TO SAFETY PRECAUTIONS W/ASSISTANCE/DEVICE ?Description: STG Adhere to Safety Precautions With cues Assistance/Device. ?Outcome: Progressing ?  ?

## 2023-01-06 NOTE — Progress Notes (Signed)
PROGRESS NOTE   Subjective/Complaints:  Pt seen with PT, no new issues , some fear of falling  ROS- neg CP, SOB, mild DOE  Objective:   No results found. Recent Labs    01/05/23 0334  WBC 13.0*  HGB 15.2  HCT 43.2  PLT 122*   Recent Labs    01/05/23 0334  NA 139  K 4.0  CL 109  CO2 19*  GLUCOSE 92  BUN 18  CREATININE 1.03  CALCIUM 8.9    Intake/Output Summary (Last 24 hours) at 01/06/2023 1107 Last data filed at 01/05/2023 1856 Gross per 24 hour  Intake 474 ml  Output --  Net 474 ml        Physical Exam: Vital Signs Blood pressure 135/79, pulse 60, temperature 98.7 F (37.1 C), resp. rate 16, height 5\' 8"  (1.727 m), weight 95.3 kg, SpO2 98%.   General: No acute distress Mood and affect are appropriate Heart: Regular rate and rhythm no rubs murmurs or extra sounds Lungs: Clear to auscultation, breathing unlabored, no rales or wheezes Abdomen: Positive bowel sounds, soft nontender to palpation, nondistended Extremities: No clubbing, cyanosis, or edema Skin: No evidence of breakdown, no evidence of rash Neurologic: Cranial nerves II through XII intact, motor strength is 5/5 in bilateral deltoid, bicep, tricep, grip, hip flexor, knee extensors, ankle dorsiflexor and plantar flexor Sensory exam normal sensation to light touch and proprioception in bilateral upper and lower extremities Cerebellar exam dysmetria RIght  finger to nose to finger  Musculoskeletal: Functional active motion in all 4 extremities. No joint swelling   Assessment/Plan: 1. Functional deficits which require 3+ hours per day of interdisciplinary therapy in a comprehensive inpatient rehab setting. Physiatrist is providing close team supervision and 24 hour management of active medical problems listed below. Physiatrist and rehab team continue to assess barriers to discharge/monitor patient progress toward functional and medical  goals  Care Tool:  Bathing              Bathing assist       Upper Body Dressing/Undressing Upper body dressing        Upper body assist      Lower Body Dressing/Undressing Lower body dressing            Lower body assist       Toileting Toileting    Toileting assist       Transfers Chair/bed transfer  Transfers assist           Locomotion Ambulation   Ambulation assist              Walk 10 feet activity   Assist           Walk 50 feet activity   Assist           Walk 150 feet activity   Assist           Walk 10 feet on uneven surface  activity   Assist           Wheelchair     Assist               Wheelchair 50 feet with 2 turns activity  Assist            Wheelchair 150 feet activity     Assist          Blood pressure 135/79, pulse 60, temperature 98.7 F (37.1 C), resp. rate 16, height 5\' 8"  (1.727 m), weight 95.3 kg, SpO2 98%.  Medical Problem List and Plan: 1. Functional deficits secondary to left thalamic and occipital infarcts             -patient may shower             -ELOS/Goals: 5-7 days, mod I goals with PT and OT   2.  Antithrombotics: -DVT/anticoagulation:  Pharmaceutical: Eliquis             -antiplatelet therapy: none   3. Pain Management: Tylenol as needed             -continue gabapentin 300 mg q HS              -heat or ice prn for left tight discomfort 4. Mood/Behavior/Sleep: LCSW to evaluate and provide emotional support             -continue bupropion 200 mg q AM and 100 mg q HS for depression history             -antipsychotic agents: n/a   5. Neuropsych/cognition: This patient is capable of making decisions on his own behalf.   6. Skin/Wound Care: Routine skin care checks   7. Fluids/Electrolytes/Nutrition: Routine Is and Os and follow-up chemistries             -continue B12   8: Hypertension: monitor TID and prn             -Toprol-XL  50 mg BID Vitals:   01/05/23 1824 01/05/23 2024  BP: (!) 153/87 135/79  Pulse: 73 60  Resp: 18 16  Temp: 97.8 F (36.6 C) 98.7 F (37.1 C)  SpO2: 95% 98%   Looks ok today    9: Hyperlipidemia: continue statin, Zetia   10: Glaucoma: continue Xalatan   11: Atrial fibrillation: on BB and Eliquis   12: CLL/prostate cancer: follows with Dr. Bertis Ruddy   13: DM-2: CBG QID; at home on metformin 500 mg daily             -continue SSI, monitor intake and consider restarting metformin CBG (last 3)  Recent Labs    01/05/23 1819 01/05/23 2122 01/06/23 0635  GLUCAP 100* 102* 106*      14: Mild leukocytosis: afebrile, improving             -follow-up CBC with diff              15: CAD: stable    16: Severe AS s/p TAVR--exercise tolerance reasonable at present    LOS: 1 days A FACE TO FACE EVALUATION WAS PERFORMED  Erick Colace 01/06/2023, 11:07 AM

## 2023-01-06 NOTE — Evaluation (Signed)
Physical Therapy Assessment and Plan  Patient Details  Name: Rodney Friedman MRN: [id number] Date of Birth: 02/28/1946  PT Diagnosis: Ataxia, Ataxic gait, Difficulty walking, and Hemiparesis dominant Rehab Potential: Good ELOS: 9-12 days   Today's Date: 01/06/2023 PT Individual Time: 0806-0920 PT Individual Time Calculation (min): 74 min    Hospital Problem: Principal Problem:   Thalamic infarction [facility])   Past Medical History:  Past Medical History:  Diagnosis Date   Adenomatous colon polyp    Alcoholism (HCC)    Anxiety    Atrial fibrillation (HCC)    CAD (coronary artery disease)    Cataract    removed left eye    CHF (congestive heart failure) (HCC) 12/16/2020   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 Clifton James & Dr. 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   Past Surgical History:  Past Surgical History:  Procedure Laterality Date   Arm Surgery Right    from door accident with glass   CARDIAC CATHETERIZATION     CATARACT EXTRACTION W/ INTRAOCULAR LENS IMPLANT Bilateral    COLONOSCOPY     CORONARY ARTERY BYPASS GRAFT  08/04/2004   EYE SURGERY     POLYPECTOMY      RIGHT/LEFT HEART CATH AND CORONARY/GRAFT ANGIOGRAPHY N/A 01/20/2021   Procedure: RIGHT/LEFT HEART CATH AND CORONARY/GRAFT ANGIOGRAPHY;  Surgeon: Kathleene Hazel, MD;  Location: [facility] INVASIVE CV LAB;  Service: Cardiovascular;  Laterality: N/A;   TEE WITHOUT CARDIOVERSION N/A 02/08/2021   Procedure: TRANSESOPHAGEAL ECHOCARDIOGRAM (TEE);  Surgeon: Kathleene Hazel, MD;  Location: [facility] INVASIVE CV LAB;  Service: Open Heart Surgery;  Laterality: N/A;   TRANSCATHETER AORTIC VALVE REPLACEMENT, CAROTID Left 02/08/2021   Procedure: TRANSCATHETER AORTIC VALVE REPLACEMENT, LEFT CAROTID;  Surgeon: Kathleene Hazel, MD;  Location: [facility] INVASIVE CV LAB;  Service: Open Heart Surgery;  Laterality: Left;    Assessment & Plan Clinical Impression: Patient is a 77 y.o. male who presented to [facility] ED as code stroke on 01/02/2023. He complained of right-sided weakness and facial droop. CT head without any obvious bleed. Maintained on Coumadin for a. fib and was therapeutic. He did not get tenecteplase because of being outside the window and being on Coumadin. No large vessel occlusion on CT. 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 Tiny acute left occipital cortex infarct. Dr. Royann Shivers, patient's cardiologist was contacted by Dr. Pearlean Brownie and she stated patient was on warfarin for cost issues. Said he can switch to Eliquis. Echo  with EF ~50-55%, aortic dilation s/p TAVR. Tolerating carb modified diet. No reports of chest pain or palpitations. Perforemd transfer from multiple surfaces 8/15 with PT with CGA for safety, cures for rail use. CGA to ambulate with RW. Up to min assist for gait without RW. The patient requires inpatient medicine and rehabilitation evaluations and services for ongoing dysfunction secondary to left thalamus infarct.  Patient transferred to CIR on 01/05/2023 .   Patient currently requires  CGA/ MinA  with mobility secondary to muscle  weakness, decreased cardiorespiratoy endurance, impaired timing and sequencing, unbalanced muscle activation, ataxia, decreased coordination, and decreased motor planning, and decreased standing balance and decreased balance strategies.  Prior to hospitalization, patient was independent  with mobility and lived with Alone in a House home.  Home access is  Stairs to enter.  Patient will benefit from skilled PT intervention to maximize safe functional mobility, minimize fall risk, and decrease caregiver burden for planned discharge home with intermittent assist.  Anticipate patient will benefit from follow up [facility] at discharge.  PT - End of Session Activity Tolerance: Tolerates 30+ min activity with multiple rests Endurance Deficit: Yes PT Assessment Rehab Potential (ACUTE/IP ONLY): Good PT Barriers to Discharge: Inaccessible home environment;Decreased caregiver support;Lack of/limited family support;Weight;Medication compliance PT Patient demonstrates impairments in the following area(s): Balance;Endurance;Motor;Pain;Safety PT Transfers Functional Problem(s): Bed to Chair;Car;Furniture PT Locomotion Functional Problem(s): Ambulation;Stairs PT Plan PT Intensity: Minimum of 1-2 x/day ,45 to 90 minutes PT Frequency: 5 out of 7 days PT Duration Estimated Length of Stay: 9-12 days PT Treatment/Interventions: Ambulation/gait training;Discharge planning;Functional mobility training;Psychosocial support;Therapeutic Activities;Visual/perceptual remediation/compensation;Wheelchair propulsion/positioning;Therapeutic Exercise;Skin care/wound management;Neuromuscular re-education;Disease management/prevention;Balance/vestibular training;Cognitive remediation/compensation;DME/adaptive equipment instruction;Pain management;Splinting/orthotics;UE/LE Strength taining/ROM;UE/LE Coordination activities;Stair training;Patient/family education;Functional electrical stimulation;Community reintegration PT Transfers  Anticipated Outcome(s): Mod I PT Locomotion Anticipated Outcome(s): Mod I PT Recommendation Recommendations for Other Services: Therapeutic Recreation consult Therapeutic Recreation Interventions: Stress management;Outing/community reintergration Follow Up Recommendations: Home health PT;Outpatient PT Patient destination: Home Equipment Recommended: To be determined   PT Evaluation Precautions/Restrictions Precautions Precautions: Fall Precaution Comments: RUE and RLE ataxia, L quad pain from fall Restrictions Weight Bearing Restrictions: No General   Vital Signs  Pain Pain Assessment Pain Scale: 0-10 Pain Score: 0-No pain Pain Interference Pain Interference Pain Effect on Sleep: 1. Rarely or not at all Pain Interference with Therapy Activities: 1. Rarely or not at all Pain Interference with Day-to-Day Activities: 2. Occasionally Home Living/Prior Functioning Home Living Available Help at Discharge: Family;Available PRN/intermittently;Friend(s) Type of Home: House Home Access: Stairs to enter Entrance Stairs-Rails: Right;Left Home Layout: One level Bathroom Shower/Tub: Walk-in shower;Tub/shower unit Bathroom Toilet: Handicapped height Bathroom Accessibility: Yes Additional Comments: Pt reports son is single father and not able to assist much.  States he has a couple friends from [employer] could help occasionally.  Lives With: Alone Prior Function Level of Independence: Independent with basic ADLs;Independent with homemaking with ambulation;Independent with gait;Independent with transfers  Able to Take Stairs?: Reciprically Driving: Yes Vocation: Retired Optometrist - History Ability to See in Adequate Light: 0 Adequate Vision - Assessment Eye Alignment: Within Functional Limits Ocular Range of Motion: Within Functional Limits Alignment/Gaze Preference: Within Defined Limits Tracking/Visual Pursuits: Able to track stimulus in all quads without  difficulty Saccades: Within functional limits Convergence: Impaired (comment) Additional Comments: self relates issue with depth perception d/t macular degen in R eye. Takes drops to prevent glaucoma in L eye every other day. Perception Perception: Within Functional Limits Praxis Praxis: WFL  Cognition Overall Cognitive Status: Within Functional Limits for tasks  assessed Arousal/Alertness: Awake/alert Orientation Level: Oriented X4 Memory: Impaired Awareness: Appears intact Executive Function: Reasoning Reasoning: Appears intact Safety/Judgment: Appears intact Sensation Sensation Light Touch: Appears Intact Coordination Gross Motor Movements are Fluid and Coordinated: No Fine Motor Movements are Fluid and Coordinated: No Finger Nose Finger Test: undershoots more with RUE Heel Shin Test: WFL with slight increase in coordination impairment (mild ataxia) with RLE Motor  Motor Motor: Other (comment);Ataxia Motor - Skilled Clinical Observations: RUE and RLE ataxia/ coordination deficit, limits LLE WB d/t pain from strike on quad with fall   Trunk/Postural Assessment  Cervical Assessment Cervical Assessment: Exceptions to [facility] (slightly forward head) Thoracic Assessment Thoracic Assessment: Exceptions to [facility] (rounded shoulders) Lumbar Assessment Lumbar Assessment: Exceptions to [facility] (posterior pelvic tilt, has stenosis) Postural Control Postural Control: Within Functional Limits  Balance Balance Balance Assessed: Yes Standardized Balance Assessment Standardized Balance Assessment: Timed Up and Go Test Timed Up and Go Test TUG: Normal TUG Normal TUG (seconds): 36.9 Static Sitting Balance Static Sitting - Balance Support: Bilateral upper extremity supported;Feet supported Static Sitting - Level of Assistance: 6: Modified independent (Device/Increase time);7: Independent Dynamic Sitting Balance Dynamic Sitting - Balance Support: Bilateral upper extremity supported;Feet  supported Dynamic Sitting - Level of Assistance: 5: Stand by assistance Static Standing Balance Static Standing - Balance Support: Bilateral upper extremity supported Static Standing - Level of Assistance: 5: Stand by assistance Dynamic Standing Balance Dynamic Standing - Balance Support: Bilateral upper extremity supported;During functional activity Dynamic Standing - Level of Assistance: Other (comment) (CGA) Extremity Assessment      RLE Assessment RLE Assessment: Within Functional Limits General Strength Comments: grossly 4/5 prox to distal LLE Assessment LLE Assessment: Within Functional Limits General Strength Comments: grossly 4/5 prox to distal - limits quad MMT d/t pain  Care Tool Care Tool Bed Mobility Roll left and right activity   Roll left and right assist level: Supervision/Verbal cueing    Sit to lying activity   Sit to lying assist level: Supervision/Verbal cueing    Lying to sitting on side of bed activity   Lying to sitting on side of bed assist level: the ability to move from lying on the back to sitting on the side of the bed with no back support.: Contact Guard/Touching assist     Care Tool Transfers Sit to stand transfer   Sit to stand assist level: Contact Guard/Touching assist    Chair/bed transfer   Chair/bed transfer assist level: Contact Guard/Touching assist     Toilet transfer   Assist Level: Contact Guard/Touching assist    Car transfer   Car transfer assist level: Contact Guard/Touching assist      Care Tool Locomotion Ambulation   Assist level: Contact Guard/Touching assist Assistive device: Walker-rolling Max distance: 175 ft  Walk 10 feet activity   Assist level: Contact Guard/Touching assist Assistive device: Walker-rolling   Walk 50 feet with 2 turns activity   Assist level: Contact Guard/Touching assist Assistive device: Walker-rolling  Walk 150 feet activity   Assist level: Contact Guard/Touching assist Assistive device:  Walker-rolling  Walk 10 feet on uneven surfaces activity Walk 10 feet on uneven surfaces activity did not occur: Safety/medical concerns      Stairs   Assist level: Contact Guard/Touching assist Stairs assistive device: 2 hand rails Max number of stairs: 8  Walk up/down 1 step activity   Walk up/down 1 step (curb) assist level: Contact Guard/Touching assist Walk up/down 1 step or curb assistive device: 2 hand rails  Walk up/down 4 steps  activity   Walk up/down 4 steps assist level: Contact Guard/Touching assist Walk up/down 4 steps assistive device: 2 hand rails  Walk up/down 12 steps activity Walk up/down 12 steps activity did not occur: Safety/medical concerns      Pick up small objects from floor   Pick up small object from the floor assist level: Moderate Assistance - Patient 50 - 74%    Wheelchair Is the patient using a wheelchair?: Yes Type of Wheelchair: Manual   Wheelchair assist level: Dependent - Patient 0% Max wheelchair distance: 259ft  Wheel 50 feet with 2 turns activity   Assist Level: Dependent - Patient 0%  Wheel 150 feet activity   Assist Level: Dependent - Patient 0%    Refer to Care Plan for Long Term Goals  SHORT TERM GOAL WEEK 1 PT Short Term Goal 1 (Week 1): pt will perform sup>sit on EOB with Mod I in under . PT Short Term Goal 2 (Week 1): Pt will perform standing transfers with consistant distant supervision to no AD. PT Short Term Goal 3 (Week 1): Pt will ambulate without RW with overall supervision and improved smoothness of gait cycle. PT Short Term Goal 4 (Week 1): Pt will perform stairs with overall distant supervision. PT Short Term Goal 5 (Week 1): Pt will complete Berg Balance test for additional outcome measure.  Recommendations for other services: Therapeutic Recreation  Stress management and Outing/community reintegration  Skilled Therapeutic Intervention Mobility Bed Mobility Bed Mobility: Supine to Sit;Sit to Supine;Rolling  Left;Rolling Right Rolling Right: Supervision/verbal cueing Rolling Left: Supervision/Verbal cueing Supine to Sit: Supervision/Verbal cueing;Contact Guard/Touching assist (extra time to complete to EOB) Sit to Supine: Supervision/Verbal cueing Transfers Transfers: Sit to Stand;Stand to Sit;Stand Pivot Transfers Sit to Stand: Contact Guard/Touching assist Stand to Sit: Contact Guard/Touching assist Stand Pivot Transfers: Contact Guard/Touching assist Transfer (Assistive device): Rolling walker Locomotion  Gait Ambulation: Yes Gait Assistance: Contact Guard/Touching assist Gait Distance (Feet): 175 Feet Assistive device: Rolling walker Gait Gait: Yes Gait Pattern: Impaired Gait Pattern: Decreased step length - right;Decreased step length - left;Decreased stance time - left;Decreased hip/knee flexion - right;Decreased weight shift to left;Right foot flat;Left foot flat Gait velocity: decreased High Level Ambulation High Level Ambulation: Backwards walking Backwards Walking: short quick steps that improve slightly with time on task Stairs / Additional Locomotion Stairs: Yes Stairs Assistance: Contact Guard/Touching assist;Minimal Assistance - Patient > 75% Stair Management Technique: Two rails;Alternating pattern Number of Stairs: 8 Height of Stairs: 6 Wheelchair Mobility Wheelchair Mobility: No  Skilled Intervention: PT Evaluation completed; see above for results. PT educated patient in roles of PT vs OT, PT POC, rehab potential, rehab goals, and discharge recommendations along with recommendation for follow-up rehabilitation services. Individual treatment initiated:  Patient supine in bed upon PT arrival. Completing breakfast. Patient alert and agreeable to PT session. No pain complaint during session.  Therapeutic Activity: Bed Mobility: Patient performed supine <> sit with CGA requiring extra time to reach seated position on EOB with feet on floor. Provided verbal cues for  technique in improving forward lean. Transfers: Patient performed sit <> stand throughout session with and without RW with MinA initially and improving quickly to CGA. Provided vc/tc for improved technique including forward lean.   Car transfer performed with cues for fully turning to sit first and then to bring BLE into footwell of car for improved balance as well as to decrease pain to use of L quad which was injured in fall.   Gait Training:  Patient ambulated  175 feet using RW with CGA throughout. Demos short steps initially that improve with time and cueing for increased step height/ length. Pt with ataxic movements in RLE and decreased time spent on LLE d/t pain in thigh from having bruised it in a fall after his stroke.   Pt also guided in shorter distances without RW. Initially wants to reach out for steadying, but without UE support demos significant reducetion in step length with more of a shuffle step. Cued to use conscious practice to give brain time to process needed info for increased step length with smoother movement. Is able to improve to minimal heel strike with BLE. Provided vc/tc for throughout for improvements to gait pattern.   Neuromuscular Re-ed: NMR facilitated during session with focus on seated and standing balance. Pt guided in seated reaching and is able to maintain with no LOB or general lean. Standing balance challenged with reaching for items in room and without RW, pt reaches out continuously for nearest furniture to steady self.    NMR performed for improvements in motor control and coordination, balance, sequencing, judgement, and self confidence/ efficacy in performing all aspects of mobility at highest level of independence.   Patient supine in bed at end of session with brakes locked, bed alarm set, and all needs within reach. Oriented to time and time of next therapy session.    Discharge Criteria: Patient will be discharged from PT if patient refuses  treatment 3 consecutive times without medical reason, if treatment goals not met, if there is a change in medical status, if patient makes no progress towards goals or if patient is discharged from hospital.  The above assessment, treatment plan, treatment alternatives and goals were discussed and mutually agreed upon: by patient  Loel Dubonnet PT, DPT, CSRS 01/06/2023, 1:44 PM

## 2023-01-06 NOTE — Progress Notes (Signed)
Occupational Therapy Session Note  Patient Details  Name: Rodney Friedman MRN: [id number] Date of Birth: 05/10/1946  {CHL IP REHAB OT TIME CALCULATIONS:[id number]}   Short Term Goals: Week 1:  OT Short Term Goal 1 (Week 1): Pt will complete LB dressing with supervision. OT Short Term Goal 2 (Week 1): Pt will complete toileting task with supervision. OT Short Term Goal 3 (Week 1): Pt will ambulate in room to retrieve items for ADLs with supevision.  Skilled Therapeutic Interventions/Progress Updates:  Pt received *** for skilled OT session with focus on ***. Pt agreeable to interventions, demonstrating overall *** mood. Pt reported ***/10 pain, stating "***" in reference to ***. OT offering intermediate rest breaks and positioning suggestions throughout session to address pain/fatigue and maximize participation/safety in session.    Pt remained *** with all immediate needs met at end of session. Pt continues to be appropriate for skilled OT intervention to promote further functional independence.   Therapy Documentation Precautions:  Precautions Precautions: Fall Precaution Comments: RUE and RLE ataxia, L quad pain from fall Restrictions Weight Bearing Restrictions: No   Therapy/Group: Individual Therapy  Lou Cal, OTR/L, MSOT  01/06/2023, 3:14 PM

## 2023-01-07 LAB — GLUCOSE, CAPILLARY
Glucose-Capillary: 106 mg/dL — ABNORMAL HIGH (ref 70–99)
Glucose-Capillary: 88 mg/dL (ref 70–99)
Glucose-Capillary: 88 mg/dL (ref 70–99)

## 2023-01-07 NOTE — Progress Notes (Signed)
Occupational Therapy Session Note  Patient Details  Name: Rodney Friedman MRN: [id number] Date of Birth: 1945/09/06  {CHL IP REHAB OT TIME CALCULATIONS:304400400}   Short Term Goals: Week 1:  OT Short Term Goal 1 (Week 1): Pt will complete LB dressing with supervision. OT Short Term Goal 2 (Week 1): Pt will complete toileting task with supervision. OT Short Term Goal 3 (Week 1): Pt will ambulate in room to retrieve items for ADLs with supevision.  Skilled Therapeutic Interventions/Progress Updates:    Patient agreeable to participate in OT session. Reports *** pain level.   Patient participated in skilled OT session focusing on ***. Therapist facilitated/assessed/developed/educated/integrated/elicited *** in order to improve/facilitate/promote   Therapy Documentation Precautions:  Precautions Precautions: Fall Precaution Comments: RUE and RLE ataxia, L quad pain from fall Restrictions Weight Bearing Restrictions: No  Therapy/Group: Individual Therapy  Limmie Patricia, OTR/L,CBIS  Supplemental OT - [facility] and [facility] Secure Chat Preferred   01/07/2023, 7:31 PM

## 2023-01-07 NOTE — Progress Notes (Signed)
PROGRESS NOTE   Subjective/Complaints:  Pt seen with PT, no new issues ,some left ant thigh pain  ROS- neg CP, SOB, mild DOE  Objective:   No results found. Recent Labs    01/05/23 0334  WBC 13.0*  HGB 15.2  HCT 43.2  PLT 122*   Recent Labs    01/05/23 0334  NA 139  K 4.0  CL 109  CO2 19*  GLUCOSE 92  BUN 18  CREATININE 1.03  CALCIUM 8.9    Intake/Output Summary (Last 24 hours) at 01/07/2023 1142 Last data filed at 01/06/2023 1954 Gross per 24 hour  Intake 477 ml  Output 300 ml  Net 177 ml        Physical Exam: Vital Signs Blood pressure (!) 146/89, pulse 66, temperature 97.6 F (36.4 C), resp. rate 18, height 5\' 8"  (1.727 m), weight 95.3 kg, SpO2 99%.   General: No acute distress Mood and affect are appropriate Heart: Regular rate and rhythm no rubs murmurs or extra sounds Lungs: Clear to auscultation, breathing unlabored, no rales or wheezes Abdomen: Positive bowel sounds, soft nontender to palpation, nondistended Extremities: No clubbing, cyanosis, or edema Skin: No evidence of breakdown, no evidence of rash Neurologic: Cranial nerves II through XII intact, motor strength is 5/5 in bilateral deltoid, bicep, tricep, grip, hip flexor, knee extensors, ankle dorsiflexor and plantar flexor Sensory exam normal sensation to light touch and proprioception in bilateral upper and lower extremities Cerebellar exam dysmetria RIght  finger to nose to finger  Musculoskeletal: Functional active motion in all 4 extremities. No joint swelling   Assessment/Plan: 1. Functional deficits which require 3+ hours per day of interdisciplinary therapy in a comprehensive inpatient rehab setting. Physiatrist is providing close team supervision and 24 hour management of active medical problems listed below. Physiatrist and rehab team continue to assess barriers to discharge/monitor patient progress toward functional and  medical goals  Care Tool:  Bathing    Body parts bathed by patient: Right arm, Left upper leg, Right lower leg, Left arm, Left lower leg, Chest, Abdomen, Front perineal area, Face, Buttocks, Right upper leg         Bathing assist Assist Level: Supervision/Verbal cueing     Upper Body Dressing/Undressing Upper body dressing   What is the patient wearing?: Pull over shirt    Upper body assist Assist Level: Set up assist    Lower Body Dressing/Undressing Lower body dressing      What is the patient wearing?: Underwear/pull up, Pants     Lower body assist Assist for lower body dressing: Minimal Assistance - Patient > 75%     Toileting Toileting Toileting Activity did not occur (Clothing management and hygiene only): N/A (no void or bm)  Toileting assist       Transfers Chair/bed transfer  Transfers assist     Chair/bed transfer assist level: Contact Guard/Touching assist     Locomotion Ambulation   Ambulation assist      Assist level: Contact Guard/Touching assist Assistive device: Walker-rolling Max distance: 175 ft   Walk 10 feet activity   Assist     Assist level: Contact Guard/Touching assist Assistive device: Walker-rolling   Walk  50 feet activity   Assist    Assist level: Contact Guard/Touching assist Assistive device: Walker-rolling    Walk 150 feet activity   Assist    Assist level: Contact Guard/Touching assist Assistive device: Walker-rolling    Walk 10 feet on uneven surface  activity   Assist Walk 10 feet on uneven surfaces activity did not occur: Safety/medical concerns         Wheelchair     Assist Is the patient using a wheelchair?: Yes Type of Wheelchair: Manual    Wheelchair assist level: Dependent - Patient 0% Max wheelchair distance: 257ft    Wheelchair 50 feet with 2 turns activity    Assist        Assist Level: Dependent - Patient 0%   Wheelchair 150 feet activity     Assist       Assist Level: Dependent - Patient 0%   Blood pressure (!) 146/89, pulse 66, temperature 97.6 F (36.4 C), resp. rate 18, height 5\' 8"  (1.727 m), weight 95.3 kg, SpO2 99%.  Medical Problem List and Plan: 1. Functional deficits secondary to left thalamic and occipital infarcts             -patient may shower             -ELOS/Goals: 5-7 days, mod I goals with PT and OT   2.  Antithrombotics: -DVT/anticoagulation:  Pharmaceutical: Eliquis             -antiplatelet therapy: none   3. Pain Management: Tylenol as needed             -continue gabapentin 300 mg q HS              -heat or ice prn for left tight discomfort 4. Mood/Behavior/Sleep: LCSW to evaluate and provide emotional support             -continue bupropion 200 mg q AM and 100 mg q HS for depression history             -antipsychotic agents: n/a   5. Neuropsych/cognition: This patient is capable of making decisions on his own behalf.   6. Skin/Wound Care: Routine skin care checks   7. 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 Bertis Ruddy   13: DM-2: CBG QID; at home on metformin 500 mg daily             -continue SSI, monitor intake and consider restarting metformin CBG (last 3)  Recent Labs    01/06/23 1210 01/06/23 1650 01/06/23 2113  GLUCAP 111* 74 98      14: Mild leukocytosis: afebrile, improving             -follow-up CBC with diff              15: CAD: stable    16: Severe AS s/p  TAVR--exercise tolerance reasonable at present     LOS: 2 days A FACE TO FACE EVALUATION WAS PERFORMED  Erick Colace 01/07/2023, 11:42 AM

## 2023-01-07 NOTE — Progress Notes (Signed)
Occupational Therapy Session Note  Patient Details  Name: Omid Brigner MRN: [id number] Date of Birth: Sep 22, 1945  {CHL IP REHAB OT TIME CALCULATIONS:304400400}   Short Term Goals: Week 1:  OT Short Term Goal 1 (Week 1): Pt will complete LB dressing with supervision. OT Short Term Goal 2 (Week 1): Pt will complete toileting task with supervision. OT Short Term Goal 3 (Week 1): Pt will ambulate in room to retrieve items for ADLs with supevision.  Skilled Therapeutic Interventions/Progress Updates:  Pt received *** for skilled OT session with focus on ***. Pt agreeable to interventions, demonstrating overall *** mood. Pt reported ***/10 pain, stating "***" in reference to ***. OT offering intermediate rest breaks and positioning suggestions throughout session to address pain/fatigue and maximize participation/safety in session.    Pt remained *** with all immediate needs met at end of session. Pt continues to be appropriate for skilled OT intervention to promote further functional independence.   Therapy Documentation Precautions:  Precautions Precautions: Fall Precaution Comments: RUE and RLE ataxia, L quad pain from fall Restrictions Weight Bearing Restrictions: No   Therapy/Group: Individual Therapy  Lou Cal, OTR/L, MSOT  01/07/2023, 12:59 PM

## 2023-01-07 NOTE — Progress Notes (Signed)
Physical Therapy Session Note  Patient Details  Name: Rodney Friedman MRN: [id number] Date of Birth: 1945/12/06  Today's Date: 01/07/2023 PT Individual Time: 1100-1200; 145-305 PT Individual Time Calculation (min): 60 min ; 80  Short Term Goals: Week 1:  PT Short Term Goal 1 (Week 1): pt will perform sup>sit on EOB with Mod I in under . PT Short Term Goal 2 (Week 1): Pt will perform standing transfers with consistant distant supervision to no AD. PT Short Term Goal 3 (Week 1): Pt will ambulate without RW with overall supervision and improved smoothness of gait cycle. PT Short Term Goal 4 (Week 1): Pt will perform stairs with overall distant supervision. PT Short Term Goal 5 (Week 1): Pt will complete Berg Balance test for additional outcome measure.  Skilled Therapeutic Interventions/Progress Updates:   Session 1:  Pt received sitting EOB in his room; no complaints of pain. Pt agreeable to PT tx.  GT x 168 ft x 2 with RW CGA and verbal cues for maintaining RW close to his body due to his pushing it too far anterior with fatigue especially.  Therex: -STS CGA x 15 with concentration on eccentric control and without using his arms from edge of the mat -2 lb therabar shoulder flex/ext,chest press, bicep curls x 20 each -Seated bil LAQ x 20 -SLR off of 4" step x 20 with PT CGA -Ball squeeze with LAQ x 20 -Core therex seated edge of mat leaning back to neutral and then reaching forward to neutral with PT palpating to ensure proper core activation -Ascend/descend 4 6" steps with bil rails x 2 bouts for functional LE strengthening with PT CGA for safety -Step ups onto 4" step with usage of RW for support and PT CGA for safety -Retro gait x 30 ft x 2 bouts for posterior chain strengthening with PT CGA for safety  Pt left in room sitting edge of bed and bed alarm on, call light and all needs within reach. No c/o pain.  Session 2:   Pt received supine in bed visiting with his son. Pt  agreeable to PT tx. No complaints of pain.  Therapeutic Activity: Supine to sit education performed due to PT noticing that pt had difficulty in performing. Pt stated he would not have bed rails at home. Education performed and then demonstration ensued with pt needing max verbal and tactile cues to perform. PT discussed with him getting bed rail if he needed from local medical supply store or [employer] if he was still having issues upon discharge with said transfer. Advised him that regular PT could assist him with getting one as well if appropriate. Supine to sit transfers performed from bed and mat table with cueing as above.  Gait training: GT > 400 ft x 2 with RW and PT CGA and verbal cues for cadence, remaining within RW base, and equalizing WB. PT noted and discussed with pt that with distractions, he pushes his walker too far forward, lets go with one hand, and walks too quickly. Pt agreed with this observation and stated he would work on it.    Therex: STS CGA from various chairs around hospital Bridge x 20 Rolling Practice kicking one leg into extension with knee flexion x 10 each side 2.2 lb weighted ball bridge 2x10 Supine hip abduction/SLR combo x 15 Supine clam shell with glute set x 20  All focused on quality of control of movement with emphasis on L LE  Pt left sitting EOB with bed alarm  on, no complaints of pain, and all needs within reach.   Therapy Documentation Precautions:  Precautions Precautions: Fall Precaution Comments: RUE and RLE ataxia, L quad pain from fall Restrictions Weight Bearing Restrictions: No   Therapy/Group: Individual Therapy  Luna Fuse 01/07/2023, 7:39 AM

## 2023-01-08 ENCOUNTER — Other Ambulatory Visit (HOSPITAL_COMMUNITY): Payer: Self-pay

## 2023-01-08 DIAGNOSIS — M79652 Pain in left thigh: Secondary | ICD-10-CM

## 2023-01-08 DIAGNOSIS — Z794 Long term (current) use of insulin: Secondary | ICD-10-CM

## 2023-01-08 LAB — BASIC METABOLIC PANEL
Anion gap: 11 (ref 5–15)
BUN: 22 mg/dL (ref 8–23)
CO2: 22 mmol/L (ref 22–32)
Calcium: 9 mg/dL (ref 8.9–10.3)
Chloride: 106 mmol/L (ref 98–111)
Creatinine, Ser: 1.2 mg/dL (ref 0.61–1.24)
GFR, Estimated: >60 mL/min (ref >60)
Glucose, Bld: 115 mg/dL — ABNORMAL HIGH (ref 70–99)
Potassium: 4.1 mmol/L (ref 3.5–5.1)
Sodium: 139 mmol/L (ref 135–145)

## 2023-01-08 LAB — GLUCOSE, CAPILLARY
Glucose-Capillary: 104 mg/dL — ABNORMAL HIGH (ref 70–99)
Glucose-Capillary: 114 mg/dL — ABNORMAL HIGH (ref 70–99)
Glucose-Capillary: 103 mg/dL — ABNORMAL HIGH (ref 70–99)
Glucose-Capillary: 136 mg/dL — ABNORMAL HIGH (ref 70–99)

## 2023-01-08 LAB — CBC
HCT: 43.3 % (ref 39.0–52.0)
Hemoglobin: 14.8 g/dL (ref 13.0–17.0)
MCH: 31.4 pg (ref 26.0–34.0)
MCHC: 34.2 g/dL (ref 30.0–36.0)
MCV: 91.9 fL (ref 80.0–100.0)
Platelets: 174 10*3/uL (ref 150–400)
RBC: 4.71 MIL/uL (ref 4.22–5.81)
RDW: 13.6 % (ref 11.5–15.5)
WBC: 11.9 10*3/uL — ABNORMAL HIGH (ref 4.0–10.5)
nRBC: 0 % (ref 0.0–0.2)

## 2023-01-08 LAB — MAGNESIUM: Magnesium: 1.8 mg/dL (ref 1.7–2.4)

## 2023-01-08 NOTE — Discharge Summary (Signed)
Physician Discharge Summary  Patient ID: Rodney Friedman MRN: [id number] DOB/AGE: Dec 14, 1945 77 y.o.  Admit date: 01/05/2023 Discharge date: 01/13/2023  Discharge Diagnoses:  Principal Problem:   Thalamic infarction [facility]) Active problems: Functional deficits secondary to left thalamic and occipital infarcts Left anterior thigh pain Hypertension Hyperlipidemia Glaucoma Atrial fibrillation History of CLL History of prostate cancer Diabetes mellitus type 2 Leukocytosis History of coronary artery disease History of severe aortic stenosis status post TAVR  Discharged Condition: stable  Significant Diagnostic Studies: none  Labs:  Basic Metabolic Panel:    Latest Ref Rng & Units 01/08/2023    5:30 AM 01/05/2023    3:34 AM 01/03/2023    3:21 AM  BMP  Glucose 70 - 99 mg/dL 644  92  92   BUN 8 - 23 mg/dL 22  18  16    Creatinine 0.61 - 1.24 mg/dL 0.34  7.42  5.95   Sodium 135 - 145 mmol/L 139  139  140   Potassium 3.5 - 5.1 mmol/L 4.1  4.0  4.3   Chloride 98 - 111 mmol/L 106  109  107   CO2 22 - 32 mmol/L 22  19  22    Calcium 8.9 - 10.3 mg/dL 9.0  8.9  9.1       CBC:    Latest Ref Rng & Units 01/08/2023    5:30 AM 01/05/2023    3:34 AM 01/03/2023    3:21 AM  CBC  WBC 4.0 - 10.5 K/uL 11.9  13.0  13.4   Hemoglobin 13.0 - 17.0 g/dL 63.8  75.6  43.3   Hematocrit 39.0 - 52.0 % 43.3  43.2  41.5   Platelets 150 - 400 K/uL 174  122  152       CBG: Recent Labs  Lab 01/12/23 1204 01/12/23 1659 01/12/23 2104 01/13/23 0644 01/13/23 1132  GLUCAP 110* 91 116* 129* 110*    Brief HPI:   Rodney Friedman is a 77 y.o. male who [facility] ED as code stroke on 01/02/2023. He complained of right-sided weakness and facial droop. CT head without any obvious bleed. Maintained on Coumadin for a. fib and was therapeutic. He did not get tenecteplase because of being outside the window and being on Coumadin. No large vessel occlusion on CT. MRI brain completed about 4  hours later did demonstrate acute lacunar infarct of the left thalamus. Tiny acute left occipital cortex infarct. Dr. Royann Shivers, patient's cardiologist was contacted by Dr. Pearlean Brownie and she stated patient was on warfarin for cost issues. Said he can switch to Eliquis. Echo with EF ~50-55%, aortic dilation s/p TAVR. Tolerating carb modified diet. No reports of chest pain or palpitations. Perforemd transfer from multiple surfaces 8/15 with PT with CGA for safety, cures for rail use. CGA to ambulate with RW. Up to min assist for gait without RW.    Hospital Course: Terron Blohm was admitted to rehab 01/05/2023 for inpatient therapies to consist of PT, ST and OT at least three hours five days a week. Past admission physiatrist, therapy team and rehab RN have worked together to provide customized collaborative inpatient rehab.  Left anterior thigh pain treated with ice and/or heat as needed.  Follow-up labs 8/19 with improvement in leukocytosis to 11.9.  Other parameters within normal limits. Heat or ice prn for left tight discomfort--pt seems to be tolerating activity.   Blood pressures were monitored on TID basis and Toprol-XL 50 mg twice daily continued  Diabetes has been  monitored with ac/hs CBG checks and SSI was use prn for tighter BS control. Essentially no coverage required.   Rehab course: During patient's stay in rehab weekly team conferences were held to monitor patient's progress, set goals and discuss barriers to discharge. At admission, patient required min assist-supervision with basic self-care skills and IADL, contact guard assist/min assist with mobility.  He  has had improvement in activity tolerance, balance, postural control as well as ability to compensate for deficits. He has had improvement in functional use RUE  and RLE as well as improvement in awareness  Discharge disposition: 01-Home or Self Care     Diet: carb modified, heart healthy  Special Instructions: No  driving, alcohol consumption or tobacco use.  Follow-up with PCP regarding resuming metformin.  Follow-up with Dr. Royann Shivers in 1-2 weeks to discuss continuing Eliquis. He has 30 day supply prescribed at discharge. Per [facility] pharmacist 01/11/2023 "Copay for Eliquis 5mg  bid on his insurance is $353, but the Eliquis free trial card will work so he can get this month free. This would be the time to get his paperwork done for patient assistance because it usually take a month to get the information, submit it, and for the drug company to send the medication to him." Patient informed. He has form from drug company in his AVS envelope to pass along to Dr. Royann Shivers.  30-35 minutes were spent on discharge planning and discharge summary. Discharge Instructions     Ambulatory referral to Physical Medicine Rehab   Complete by: As directed    Hospital follow-up in about 3 weeks      Allergies as of 01/13/2023       Reactions   Other Rash   Allergen: "Plants and bushes while doing yard work"   [employer] Extract Rash        Medication List     STOP taking these medications    albuterol 108 (90 Base) MCG/ACT inhaler Commonly known as: VENTOLIN HFA   metFORMIN 500 MG tablet Commonly known as: GLUCOPHAGE   multivitamin with minerals tablet   polyethylene glycol 17 g packet Commonly known as: MIRALAX / GLYCOLAX   PREVIDENT 5000 DRY MOUTH DT   senna-docusate 8.6-50 MG tablet Commonly known as: Senokot-S       TAKE these medications    acetaminophen 325 MG tablet Commonly known as: TYLENOL Take 1-2 tablets (325-650 mg total) by mouth every 4 (four) hours as needed for mild pain. What changed:  how much to take reasons to take this   amoxicillin 500 MG tablet Commonly known as: AMOXIL Take 4 tablets (2000 mg) by mouth ONE HOUR before any dental procedures.   buPROPion 200 MG 12 hr tablet Commonly known as: WELLBUTRIN SR Take 1 tablet (200 mg total) by  mouth every morning.   buPROPion ER 100 MG 12 hr tablet Commonly known as: Wellbutrin SR 1 qhs   docusate sodium 100 MG capsule Commonly known as: COLACE Take 1 capsule (100 mg total) by mouth 2 (two) times daily.   Eliquis 5 MG Tabs tablet Generic drug: apixaban Take 1 tablet (5 mg total) by mouth 2 (two) times daily.   ezetimibe 10 MG tablet Commonly known as: ZETIA Take 1 tablet (10 mg total) by mouth daily.   gabapentin 300 MG capsule Commonly known as: NEURONTIN Take 1 capsule (300 mg total) by mouth at bedtime. What changed:  medication strength how much to take how to take this when to  take this additional instructions   latanoprost 0.005 % ophthalmic solution Commonly known as: XALATAN Place 1 drop into the left eye every other day.   losartan 25 MG tablet Commonly known as: Cozaar Take 1 tablet (25 mg total) by mouth daily.   metoprolol succinate 50 MG 24 hr tablet Commonly known as: TOPROL-XL Take 1 tablet (50 mg total) by mouth 2 (two) times daily. Take with or immediately following a meal.   rosuvastatin 20 MG tablet Commonly known as: CRESTOR Take 1 tablet (20 mg total) by mouth daily.        Follow-up Information     Ivonne [name], NP Follow up.   Specialties: Pulmonary Disease, Endocrinology Why: Call the office on Monday to make arrangments for hospital follow-up appointment. Contact information: [address] [place] [address] [phone]         Erick Colace, MD Follow up.   Specialty: Physical Medicine and Rehabilitation Why: office will call you to arrange your appt (sent) Contact information: [address] [place] [address] [phone]         [facility] Follow up.   Why: Call the office on Monday to make arrangments for hospital follow-up appointment. Contact information: [address]     [address] [place] [address] [phone]         Thurmon Fair, MD Follow up.   Specialty: Cardiology Why: Call the office on Monday to make arrangments for hospital follow-up appointment. Contact information: [address] [place] [address] [phone]                 Signed: Milinda Antis 01/15/2023, 9:33 AM

## 2023-01-08 NOTE — Discharge Instructions (Addendum)
Inpatient Rehab Discharge Instructions  Rodney Friedman Discharge date and time: 01/13/2023  Activities/Precautions/ Functional Status: Activity: no lifting, driving, or strenuous exercise until cleared by MD Diet: diabetic diet Wound Care: none needed Functional status:  ___ No restrictions     ___ Walk up steps independently ___ 24/7 supervision/assistance   ___ Walk up steps with assistance _x__ Intermittent supervision/assistance  ___ Bathe/dress independently ___ Walk with walker     ___ Bathe/dress with assistance ___ Walk Independently    ___ Shower independently ___ Walk with assistance    __x_ Shower with assistance _x__ No alcohol     ___ Return to work/school ________  Special Instructions: No driving, alcohol consumption or tobacco use.  Recommend daily BP measurement in same arm and record time of day. Bring this information with you to follow-up appointment with PCP.   COMMUNITY REFERRALS UPON DISCHARGE:    Home Health:   PT      OT                      Agency: [facility]          Phone: [phone] *Please expect follow-up within 2-3 days to schedule your home visit. If you have not received follow-up, be sure to contact the branch directly.*    Medical Equipment/Items Ordered:rolling walker                                                 Agency/Supplier:[facility] [phone]   STROKE/TIA DISCHARGE INSTRUCTIONS SMOKING Cigarette smoking nearly doubles your risk of having a stroke & is the single most alterable risk factor  If you smoke or have smoked in the last 12 months, you are advised to quit smoking for your health. Most of the excess cardiovascular risk related to smoking disappears within a year of stopping. Ask you doctor about anti-smoking medications [place] Quit Line: 1-800-QUIT NOW Free Smoking Cessation Classes [phone]  CHOLESTEROL Know your levels; limit fat & cholesterol in your diet  Lipid Panel     Component Value Date/Time    CHOL 88 01/03/2023 0321   CHOL 99 (L) 05/26/2022 0908   TRIG 140 01/03/2023 0321   HDL 32 (L) 01/03/2023 0321   HDL 34 (L) 05/26/2022 0908   CHOLHDL 2.8 01/03/2023 0321   VLDL 28 01/03/2023 0321   LDLCALC 28 01/03/2023 0321   LDLCALC 42 05/26/2022 0908     Many patients benefit from treatment even if their cholesterol is at goal. Goal: Total Cholesterol (CHOL) less than 160 Goal:  Triglycerides (TRIG) less than 150 Goal:  HDL greater than 40 Goal:  LDL (LDLCALC) less than 100   BLOOD PRESSURE American Stroke Association blood pressure target is less that 120/80 mm/Hg  Your discharge blood pressure is:  BP: (!) 142/89 Monitor your blood pressure Limit your salt and alcohol intake Many individuals will require more than one medication for high blood pressure  DIABETES (A1c is a blood sugar average for last 3 months) Goal HGBA1c is under 7% (HBGA1c is blood sugar average for last 3 months)  Diabetes: Diagnosis of diabetes:  Your A1c:6.8 %    Lab Results  Component Value Date   HGBA1C 6.8 (H) 01/03/2023    Your HGBA1c can be lowered with medications, healthy diet, and exercise. Check your blood sugar as directed by  your physician Call your physician if you experience unexplained or low blood sugars.  PHYSICAL ACTIVITY/REHABILITATION Goal is 30 minutes at least 4 days per week  Activity: Increase activity slowly, Therapies: Physical Therapy: Home Health and Occupational Therapy: Home Health Return to work: n/a Activity decreases your risk of heart attack and stroke and makes your heart stronger.  It helps control your weight and blood pressure; helps you relax and can improve your mood. Participate in a regular exercise program. Talk with your doctor about the best form of exercise for you (dancing, walking, swimming, cycling).  DIET/WEIGHT Goal is to maintain a healthy weight  Your discharge diet is:  Diet Order             Diet Carb Modified Fluid consistency: Thin; Room  service appropriate? Yes  Diet effective now                  thin liquids Your height is:  Height: 5\' 8"  (172.7 cm) Your current weight is: Weight: 95.3 kg Your Body Mass Index (BMI) is:  BMI (Calculated): 31.95 Following the type of diet specifically designed for you will help prevent another stroke. Your goal weight range is:   Your goal Body Mass Index (BMI) is 19-24. Healthy food habits can help reduce 3 risk factors for stroke:  High cholesterol, hypertension, and excess weight.  RESOURCES Stroke/Support Group:  Call [phone]   STROKE EDUCATION PROVIDED/REVIEWED AND GIVEN TO PATIENT Stroke warning signs and symptoms How to activate emergency medical system (call 911). Medications prescribed at discharge. Need for follow-up after discharge. Personal risk factors for stroke. Pneumonia vaccine given: No Flu vaccine given: No My questions have been answered, the writing is legible, and I understand these instructions.  I will adhere to these goals & educational materials that have been provided to me after my discharge from the hospital.     My questions have been answered and I understand these instructions. I will adhere to these goals and the provided educational materials after my discharge from the hospital.  Patient/Caregiver Signature _______________________________ Date __________  Clinician Signature _______________________________________ Date __________  Please bring this form and your medication list with you to all your follow-up doctor's appointments.    Stop taking warfarin - Eliquis replaces your warfarin as a blood thinner.  Information on my medicine - ELIQUIS (apixaban)  Why was Eliquis prescribed for you? Eliquis was prescribed for you to reduce the risk of a blood clot forming that can cause a stroke if you have a medical condition called atrial fibrillation (a type of irregular heartbeat).  What do You need to know about Eliquis ? Take your  Eliquis TWICE DAILY - one tablet in the morning and one tablet in the evening with or without food. If you have difficulty swallowing the tablet whole please discuss with your pharmacist how to take the medication safely.  Take Eliquis exactly as prescribed by your doctor and DO NOT stop taking Eliquis without talking to the doctor who prescribed the medication.  Stopping may increase your risk of developing a stroke.  Refill your prescription before you run out.  After discharge, you should have regular check-up appointments with your healthcare provider that is prescribing your Eliquis.  In the future your dose may need to be changed if your kidney function or weight changes by a significant amount or as you get older.  What do you do if you miss a dose? If you miss a dose, take it as  soon as you remember on the same day and resume taking twice daily.  Do not take more than one dose of ELIQUIS at the same time to make up a missed dose.  Important Safety Information A possible side effect of Eliquis is bleeding. You should call your healthcare provider right away if you experience any of the following: Bleeding from an injury or your nose that does not stop. Unusual colored urine (red or dark brown) or unusual colored stools (red or black). Unusual bruising for unknown reasons. A serious fall or if you hit your head (even if there is no bleeding).  Some medicines may interact with Eliquis and might increase your risk of bleeding or clotting while on Eliquis. To help avoid this, consult your healthcare provider or pharmacist prior to using any new prescription or non-prescription medications, including herbals, vitamins, non-steroidal anti-inflammatory drugs (NSAIDs) and supplements.  This website has more information on Eliquis (apixaban): http://[email or web address].com/eliquis/home

## 2023-01-08 NOTE — Progress Notes (Addendum)
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 Clifton James & Dr. 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   Past Surgical History:  Past Surgical History:  Procedure Laterality Date   Arm Surgery Right    from door accident with glass   CARDIAC CATHETERIZATION     CATARACT EXTRACTION W/ INTRAOCULAR LENS IMPLANT Bilateral    COLONOSCOPY     CORONARY ARTERY BYPASS GRAFT  08/04/2004   EYE SURGERY     POLYPECTOMY     RIGHT/LEFT HEART CATH AND CORONARY/GRAFT ANGIOGRAPHY N/A 01/20/2021   Procedure: RIGHT/LEFT HEART CATH AND CORONARY/GRAFT ANGIOGRAPHY;  Surgeon: Kathleene Hazel, MD;   Location: [facility] INVASIVE CV LAB;  Service: Cardiovascular;  Laterality: N/A;   TEE WITHOUT CARDIOVERSION N/A 02/08/2021   Procedure: TRANSESOPHAGEAL ECHOCARDIOGRAM (TEE);  Surgeon: Kathleene Hazel, MD;  Location: [facility] INVASIVE CV LAB;  Service: Open Heart Surgery;  Laterality: N/A;   TRANSCATHETER AORTIC VALVE REPLACEMENT, CAROTID Left 02/08/2021   Procedure: TRANSCATHETER AORTIC VALVE REPLACEMENT, LEFT CAROTID;  Surgeon: Kathleene Hazel, MD;  Location: [facility] INVASIVE CV LAB;  Service: Open Heart Surgery;  Laterality: Left;   Social History:  reports that he quit smoking about 2 years ago. His smoking use included cigarettes. He started smoking about 61 years ago. He has a 44.3 pack-year smoking history. He has been exposed to tobacco smoke. He has never used smokeless tobacco. He reports that he does not drink alcohol and does not use drugs.  Family / Support Systems Marital Status: Divorced How Long?: n/a Spouse/Significant Other: n/a Children: Daughter ([place]) and Son ([place]) Other Supports: AA friends Anticipated Caregiver: none 24/7; PRN from friends Ability/Limitations of Caregiver: son unable to provide 24/7. Caregiver Availability: Intermittent Family Dynamics: Patient son in [place] (unable to provide support), Daughter in [place] and support from friends.  Social History Preferred language: English Religion: Presbyterian Cultural Background: Patient from [place] and moved here to support son in school. Son Education: HS Health Literacy - How often  do you need to have someone help you when you read instructions, pamphlets, or other written material from your doctor or pharmacy?: Never Writes: Yes Employment Status: Retired Date Retired/Disabled/Unemployed: n/a Marine scientist Issues: n/a Guardian/Conservator: n/a   Abuse/Neglect Abuse/Neglect Assessment Can Be Completed: Yes Physical Abuse: Denies Verbal Abuse: Denies Sexual Abuse: Denies Exploitation of  patient/patient's resources: Denies Self-Neglect: Denies  Patient response to: Social Isolation - How often do you feel lonely or isolated from those around you?: Never  Emotional Status Recent Psychosocial Issues: coping Psychiatric History: hx of anxiety, sepression Substance Abuse History: past hx. Patient reports 22 years free of substance and 25 years from alcohol  Patient / Family Perceptions, Expectations & Goals Pt/Family understanding of illness & functional limitations: yes Premorbid pt/family roles/activities: Independent overall Anticipated changes in roles/activities/participation: Patient plans to remain independent due to limited assistance Pt/family expectations/goals: MOD I  Recruitment consultant Agencies: None Premorbid Home Care/DME Agencies: Other (Comment) (Patient has father's olf DME:  Shower seat, RW, SPC and transport chair) Transportation available at discharge: Friends able to transport Is the patient able to respond to transportation needs?: Yes In the past 12 months, has lack of transportation kept you from medical appointments or from getting medications?: No In the past 12 months, has lack of transportation kept you from meetings, work, or from getting things needed for daily living?: No Resource referrals recommended: Neuropsychology  Discharge Planning Living Arrangements: Alone Support Systems: Children, Friends/neighbors Type of Residence: Private residence Insurance Resources: Electrical engineer Resources: Restaurant manager, fast food Screen Referred: No Living Expenses: Psychologist, sport and exercise Management: Patient Does the patient have any problems obtaining your medications?: No Home Management: Independent Patient/Family Preliminary Plans: Patient plans to continue to manage MOD I Care Coordinator Barriers to Discharge: Lack of/limited family support, Decreased caregiver support Care Coordinator Anticipated Follow Up Needs: HH/OP Expected length  of stay: 5-7 Days  Clinical Impression SW met with patient introduced self and explained role. Patient plans to d/c back home MOD I with son unable to assist 24/7. Friends able to assist PRN. Patient has 4 steps to enter the home. Patient has DME from his fathers prior hospital stay. Patient currently has a shower seat, RW, SPC, IT sales professional. Patient requesting d/c ASAP to managing financial issues at home. Pt reports son is unable to assist and he is unable to manage via telephone. SW FU with team to discuss progress. Patient previously established with [facility].  Andria Rhein 01/08/2023, 2:27 PM

## 2023-01-08 NOTE — Progress Notes (Signed)
PROGRESS NOTE   Subjective/Complaints:  Pt up with therapy in gym. In good spirits. Anxious to get home!  ROS: Patient denies fever, rash, sore throat, blurred vision, dizziness, nausea, vomiting, diarrhea, cough, shortness of breath or chest pain,  headache, or mood change.   Objective:   No results found. Recent Labs    01/08/23 0530  WBC 11.9*  HGB 14.8  HCT 43.3  PLT 174   Recent Labs    01/08/23 0530  NA 139  K 4.1  CL 106  CO2 22  GLUCOSE 115*  BUN 22  CREATININE 1.20  CALCIUM 9.0    Intake/Output Summary (Last 24 hours) at 01/08/2023 1025 Last data filed at 01/07/2023 1850 Gross per 24 hour  Intake 674 ml  Output --  Net 674 ml        Physical Exam: Vital Signs Blood pressure (!) 147/85, pulse (!) 59, temperature 98.2 F (36.8 C), resp. rate 18, height 5\' 8"  (1.727 m), weight 95.3 kg, SpO2 99%.   Constitutional: No distress . Vital signs reviewed. HEENT: NCAT, EOMI, oral membranes moist Neck: supple Cardiovascular: RRR without murmur. No JVD    Respiratory/Chest: CTA Bilaterally without wheezes or rales. Normal effort    GI/Abdomen: BS +, non-tender, non-distended Ext: no clubbing, cyanosis, or edema Psych: pleasant and cooperative  Skin: No evidence of breakdown, no evidence of rash Neurologic: Cranial nerves II through XII intact, motor strength is 5/5 in bilateral deltoid, bicep, tricep, grip, hip flexor, knee extensors, ankle dorsiflexor and plantar flexor Sensory exam normal sensation to light touch and proprioception in bilateral upper and lower extremities Cerebellar exam dysmetria RIght  finger to nose to finger  Musculoskeletal: Functional active motion in all 4 extremities. No joint swelling. Right thigh pain.   Assessment/Plan: 1. Functional deficits which require 3+ hours per day of interdisciplinary therapy in a comprehensive inpatient rehab setting. Physiatrist is providing  close team supervision and 24 hour management of active medical problems listed below. Physiatrist and rehab team continue to assess barriers to discharge/monitor patient progress toward functional and medical goals  Care Tool:  Bathing    Body parts bathed by patient: Right arm, Left upper leg, Right lower leg, Left arm, Left lower leg, Chest, Abdomen, Front perineal area, Face, Buttocks, Right upper leg         Bathing assist Assist Level: Supervision/Verbal cueing     Upper Body Dressing/Undressing Upper body dressing   What is the patient wearing?: Pull over shirt    Upper body assist Assist Level: Set up assist    Lower Body Dressing/Undressing Lower body dressing      What is the patient wearing?: Underwear/pull up, Pants     Lower body assist Assist for lower body dressing: Minimal Assistance - Patient > 75%     Toileting Toileting Toileting Activity did not occur (Clothing management and hygiene only): N/A (no void or bm)  Toileting assist       Transfers Chair/bed transfer  Transfers assist     Chair/bed transfer assist level: Contact Guard/Touching assist     Locomotion Ambulation   Ambulation assist      Assist level: Contact Guard/Touching assist  Assistive device: Walker-rolling Max distance: 150   Walk 10 feet activity   Assist     Assist level: Contact Guard/Touching assist Assistive device: Walker-rolling   Walk 50 feet activity   Assist    Assist level: Contact Guard/Touching assist Assistive device: Walker-rolling    Walk 150 feet activity   Assist    Assist level: Contact Guard/Touching assist Assistive device: Walker-rolling    Walk 10 feet on uneven surface  activity   Assist Walk 10 feet on uneven surfaces activity did not occur: Safety/medical concerns         Wheelchair     Assist Is the patient using a wheelchair?: Yes Type of Wheelchair: Manual    Wheelchair assist level: Dependent -  Patient 0% Max wheelchair distance: 249ft    Wheelchair 50 feet with 2 turns activity    Assist        Assist Level: Dependent - Patient 0%   Wheelchair 150 feet activity     Assist      Assist Level: Dependent - Patient 0%   Blood pressure (!) 147/85, pulse (!) 59, temperature 98.2 F (36.8 C), resp. rate 18, height 5\' 8"  (1.727 m), weight 95.3 kg, SpO2 99%.  Medical Problem List and Plan: 1. Functional deficits secondary to left thalamic and occipital infarcts             -patient may shower             -ELOS/Goals: 5-7 days, mod I goals with PT and OT   -Continue CIR therapies including PT, OT  2.  Antithrombotics: -DVT/anticoagulation:  Pharmaceutical: Eliquis             -antiplatelet therapy: none   3. Pain Management: Tylenol as needed             -continue gabapentin 300 mg q HS              -heat or ice prn for left tight discomfort--pt seems to be tolerating activity 4. Mood/Behavior/Sleep: LCSW to evaluate and provide emotional support             -continue bupropion 200 mg q AM and 100 mg q HS for depression history             -antipsychotic agents: n/a   5. Neuropsych/cognition: This patient is capable of making decisions on his own behalf.   6. Skin/Wound Care: Routine skin care checks   7. Fluids/Electrolytes/Nutrition: Routine Is and Os and follow-up chemistries             -continue B12   I personally reviewed all of the patient's labs today, and lab work is within normal limits.  8: Hypertension: monitor TID and prn             -Toprol-XL 50 mg BID Vitals:   01/07/23 2020 01/08/23 0513  BP: 126/80 (!) 147/85  Pulse: 68 (!) 59  Resp: 18 18  Temp: 97.6 F (36.4 C) 98.2 F (36.8 C)  SpO2: 98% 99%  8/19 bp borderline this morning--observe today for pattern    9: Hyperlipidemia: continue statin, Zetia   10: Glaucoma: continue Xalatan   11: Atrial fibrillation: on BB and Eliquis   12: CLL/prostate cancer: follows with Dr. Bertis Ruddy    13: DM-2: CBG QID; at home on metformin 500 mg daily             -continue SSI, monitor intake and consider restarting metformin CBG (last 3)  Recent Labs    01/07/23 1714 01/07/23 2126 01/08/23 0615  GLUCAP 88 88 104*   8/19 well controlled    14: Mild leukocytosis: afebrile, improving             -8/19 WBC's trending down--11.9 today              15: CAD: stable    16: Severe AS s/p TAVR--exercise tolerance reasonable at present    LOS: 3 days A FACE TO FACE EVALUATION WAS PERFORMED  Ranelle Oyster 01/08/2023, 10:25 AM

## 2023-01-08 NOTE — IPOC Note (Signed)
Overall Plan of Care [facility]) Patient Details Name: Rodney Friedman MRN: [id number] DOB: [birth year]  Admitting Diagnosis: Thalamic infarction [facility])  Hospital Problems: Principal Problem:   Thalamic infarction [facility])     Functional Problem List: Nursing Safety, Medication Management, Bowel, Endurance, Pain  PT Balance, Endurance, Motor, Pain, Safety  OT Balance, Perception, Safety, Cognition, Sensory, Endurance, Motor  SLP    TR         Basic ADL's: OT Grooming, Bathing, Dressing, Toileting     Advanced  ADL's: OT Simple Meal Preparation, Laundry     Transfers: PT Bed to Chair, Set designer, Occupational psychologist, Research scientist (life sciences): PT Ambulation, Stairs     Additional Impairments: OT None  SLP        TR      Anticipated Outcomes Item Anticipated Outcome  Self Feeding N/A  Swallowing      Basic self-care  Mod I  Toileting  Mod I   Bathroom Transfers Mod I  Bowel/Bladder  manage bowel w mod I assist  Transfers  Mod I  Locomotion  Mod I  Communication     Cognition     Pain  < 4 with prns  Safety/Judgment  manage safety w cues   Therapy Plan: PT Intensity: Minimum of 1-2 x/day ,45 to 90 minutes PT Frequency: 5 out of 7 days PT Duration Estimated Length of Stay: 9-12 days OT Intensity: Minimum of 1-2 x/day, 45 to 90 minutes OT Frequency: 5 out of 7 days OT Duration/Estimated Length of Stay: 10-14 days     Team Interventions: Nursing Interventions Medication Management, Disease Management/Prevention, Patient/Family Education, Bowel Management, Discharge Planning  PT interventions Ambulation/gait training, Discharge planning, Functional mobility training, Psychosocial support, Therapeutic Activities, Visual/perceptual remediation/compensation, Wheelchair propulsion/positioning, Therapeutic Exercise, Skin care/wound management, Neuromuscular re-education, Disease management/prevention, Warden/ranger, Cognitive remediation/compensation,  DME/adaptive equipment instruction, Pain management, Splinting/orthotics, UE/LE Strength taining/ROM, UE/LE Coordination activities, Stair training, Patient/family education, Functional electrical stimulation, Community reintegration  OT Interventions Warden/ranger, Disease mangement/prevention, Firefighter, Development worker, international aid stimulation, Neuromuscular re-education, Equities trader education, Splinting/orthotics, Self Care/advanced ADL retraining, Therapeutic Exercise, UE/LE Coordination activities, Wheelchair propulsion/positioning, UE/LE Strength taining/ROM, Therapeutic Activities, Pain management, Functional mobility training, DME/adaptive equipment instruction, Discharge planning, Cognitive remediation/compensation, Skin care/wound managment, Psychosocial support  SLP Interventions    TR Interventions    SW/CM Interventions     Barriers to Discharge MD  Medical stability  Nursing Home environment access/layout, Lack of/limited family support 1 level 4 ste , bil rails, solo; was driving as needed for appointments. Pt reports son is single father and not able to assist much.  States he has a couple friends from [employer] could help occasionally.  PT Inaccessible home environment, Decreased caregiver support, Lack of/limited family support, Weight, Medication compliance    OT      SLP      SW       Team Discharge Planning: Destination: PT-Home ,OT- Home , SLP-  Projected Follow-up: PT-Home health PT, Outpatient PT, OT-  Other (comment) (TBD), SLP-  Projected Equipment Needs: PT-To be determined, OT-  , SLP-  Equipment Details: PT- , OT-TBD- likley shower chair Patient/family involved in discharge planning: PT- Patient,  OT-Patient, SLP-   MD ELOS: 8-11 days Medical Rehab Prognosis:  Excellent Assessment: The patient has been admitted for CIR therapies with the diagnosis of thalamic infarct. The team will be addressing functional mobility, strength, stamina,  balance, safety, adaptive techniques and equipment, self-care, bowel and bladder mgt, patient and caregiver education,  NMR, community reentry, pain mgt. Goals have been set at [facility] goals with PT and OT. Anticipated discharge destination is home.        See Team Conference Notes for weekly updates to the plan of care

## 2023-01-08 NOTE — Progress Notes (Signed)
Inpatient Rehabilitation  Patient information reviewed and entered into eRehab system by Kelly Gentry, OTR/L, Rehab Quality Coordinator.   Information including medical coding, functional ability and quality indicators will be reviewed and updated through discharge.   

## 2023-01-08 NOTE — Progress Notes (Signed)
Physical Therapy Session Note  Patient Details  Name: Rodney Friedman MRN: [id number] Date of Birth: 11/12/1945  Today's Date: 01/08/2023 PT Individual Time: 1300-1329 PT Individual Time Calculation (min): 29 min   Short Term Goals: Week 1:  PT Short Term Goal 1 (Week 1): pt will perform sup>sit on EOB with Mod I in under . PT Short Term Goal 2 (Week 1): Pt will perform standing transfers with consistant distant supervision to no AD. PT Short Term Goal 3 (Week 1): Pt will ambulate without RW with overall supervision and improved smoothness of gait cycle. PT Short Term Goal 4 (Week 1): Pt will perform stairs with overall distant supervision. PT Short Term Goal 5 (Week 1): Pt will complete Berg Balance test for additional outcome measure.  Skilled Therapeutic Interventions/Progress Updates:     Pt received seated in recliner and agrees to therapy. Reports chronic pain in back as well as pain in Lt thigh. PT provides education and rest breaks to manage pain. Pt performs sit to stand with CGA and ambulates x125' to gym with minA and no AD, with emphasis on engagement of Lt quads/hamstrings during stance phase to control knee extension and prevent buckling or snapping into hyperextension. Cues also maintain upright gaze to improve posture and balance. Pt completes Nustep for endurance training as well as reciprocal coordination. Pt completes x10:00 at workload of 6 with average steps per minute ~45. PT provides cues for hand and foot placement and completing full available ROM. Pt then ambulates x125' back to room with same assistance and cues. Left seated in WC with alarm intact and all needs within reach.   Therapy Documentation Precautions:  Precautions Precautions: Fall Precaution Comments: RUE and RLE ataxia, L quad pain from fall Restrictions Weight Bearing Restrictions: No   Therapy/Group: Individual Therapy  Beau Fanny, PT, DPT 01/08/2023, 5:10 PM

## 2023-01-08 NOTE — Progress Notes (Signed)
Physical Therapy Session Note  Patient Details  Name: Rodney Friedman MRN: [id number] Date of Birth: 1945-06-04  Today's Date: 01/08/2023 PT Individual Time: 0800-0915 PT Individual Time Calculation (min): 75 min   Short Term Goals: Week 1:  PT Short Term Goal 1 (Week 1): pt will perform sup>sit on EOB with Mod I in under . PT Short Term Goal 2 (Week 1): Pt will perform standing transfers with consistant distant supervision to no AD. PT Short Term Goal 3 (Week 1): Pt will ambulate without RW with overall supervision and improved smoothness of gait cycle. PT Short Term Goal 4 (Week 1): Pt will perform stairs with overall distant supervision. PT Short Term Goal 5 (Week 1): Pt will complete Berg Balance test for additional outcome measure.  Skilled Therapeutic Interventions/Progress Updates: Pt presents sitting on EOB and agreeable to therapy.  Pt performed sit to stand w/ CGA and then amb w/ RW and CGA x 150' to dayroom w/ verbal cues for posture, walker maintenance.  Pt performed Nu-step at Level 4 x 10' for total of 434 steps and average of 43 spm.  Pt performed 2 x 5 blocks of STS w/ L foot on 2" platform w/ CGA and cueing for sequencing.  Pt amb x 150' to main gym w/ RW and CGA/ close supervision, verbal cues for posture, visual scanning.  Pt performed STS blocks of 5 w/ 3 3/4" platform under LLE.  Pt amb 90' w/ HHA on R and min A/CGA and then w/o UE assist 90' x 2, verbal cues for posture and arm swing for balance.  Pt amb into room from hallway to sitting on EOB.  Pt remained sitting w/ bed alarm on and all needs in reach.     Therapy Documentation Precautions:  Precautions Precautions: Fall Precaution Comments: RUE and RLE ataxia, L quad pain from fall Restrictions Weight Bearing Restrictions: No General:   Vital Signs:   Pain:5/10 L thigh w/ activity. Pain Assessment Pain Scale: 0-10 Pain Score: 5  Pain Type: Chronic pain Pain Location: Back Pain Descriptors / Indicators:  Discomfort;Aching Pain Frequency: Constant Pain Onset: Gradual    Therapy/Group: Individual Therapy  [name] 01/08/2023, 9:21 AM

## 2023-01-08 NOTE — Progress Notes (Signed)
Inpatient Rehabilitation Center Individual Statement of Services  Patient Name:  Rodney Friedman  Date:  01/08/2023  Welcome to the Inpatient Rehabilitation Center.  Our goal is to provide you with an individualized program based on your diagnosis and situation, designed to meet your specific needs.  With this comprehensive rehabilitation program, you will be expected to participate in at least 3 hours of rehabilitation therapies Monday-Friday, with modified therapy programming on the weekends.  Your rehabilitation program will include the following services:  Physical Therapy (PT), Occupational Therapy (OT), Speech Therapy (ST), 24 hour per day rehabilitation nursing, Therapeutic Recreaction (TR), Neuropsychology, Care Coordinator, Rehabilitation Medicine, Nutrition Services, Pharmacy Services, and Other  Weekly team conferences will be held on Wednesdays to discuss your progress.  Your Inpatient Rehabilitation Care Coordinator will talk with you frequently to get your input and to update you on team discussions.  Team conferences with you and your family in attendance may also be held.  Expected length of stay: 5-7 Days  Overall anticipated outcome:  MOD I  Depending on your progress and recovery, your program may change. Your Inpatient Rehabilitation Care Coordinator will coordinate services and will keep you informed of any changes. Your Inpatient Rehabilitation Care Coordinator's name and contact numbers are listed  below.  The following services may also be recommended but are not provided by the Inpatient Rehabilitation Center:   Home Health Rehabiltiation Services Outpatient Rehabilitation Services    Arrangements will be made to provide these services after discharge if needed.  Arrangements include referral to agencies that provide these services.  Your insurance has been verified to be:  Medicare A & B Your primary doctor is:  NO PCP  Pertinent information will be shared with  your doctor and your insurance company.  Inpatient Rehabilitation Care Coordinator:  Lavera Guise, [place] [phone] or [phone]  Information discussed with and copy given to patient by: Andria Rhein, 01/08/2023, 9:38 AM

## 2023-01-08 NOTE — Progress Notes (Signed)
Patient ID: Eliseo Vandeputte, male   DOB: 12-09-1945, 77 y.o.   MRN: [id number] Met with the patient to review current situation, rehab process, team conference and plan of care. Discussed secondary risk management including change of Coumadin to Eliquis. Patient expressed concern about $ for Eliquis; Told ~ $300.00  post insurance and he notes he is unsure he can afford this. Given application form for [employer] to complete. Reviewed HTN, HLD (LDL 28/Trig 140) on Zetia/Crestor PTA, DM A1C 6.8, HF, and previous valve replacement. Used [facility] HH services in the past; would like to continue with them if possible as he does not have access to a ride for OP services.  Forwarded information to SW. Noted MD adjusting coumadin dosage PTA. Reviewed dietary modification recommendations. Discussed hx of constipation; taking medication.Has a My Chart account already set up. Continue to follow along to address educational needs to facilitate preparation for discharge. Pamelia Hoit'

## 2023-01-09 ENCOUNTER — Ambulatory Visit (HOSPITAL_COMMUNITY): Payer: 59 | Admitting: Psychiatry

## 2023-01-09 LAB — GLUCOSE, CAPILLARY
Glucose-Capillary: 116 mg/dL — ABNORMAL HIGH (ref 70–99)
Glucose-Capillary: 107 mg/dL — ABNORMAL HIGH (ref 70–99)
Glucose-Capillary: 88 mg/dL (ref 70–99)
Glucose-Capillary: 107 mg/dL — ABNORMAL HIGH (ref 70–99)

## 2023-01-09 MED ORDER — LOSARTAN POTASSIUM 25 MG PO TABS
25.0000 mg | ORAL_TABLET | Freq: Every day | ORAL | Status: DC
  Administered 2023-01-09 – 2023-01-13 (×5): 25 mg via ORAL
  Filled 2023-01-09 (×5): qty 1

## 2023-01-09 NOTE — Progress Notes (Signed)
Physical Therapy Session Note  Patient Details  Name: Rodney Friedman MRN: [id number] Date of Birth: 08-12-1945  Today's Date: 01/09/2023 PT Individual Time: 0805-0905; 1335 - 1430 PT Individual Time Calculation (min): 60 min; 55 min   Short Term Goals: Week 1:  PT Short Term Goal 1 (Week 1): pt will perform sup>sit on EOB with Mod I in under . PT Short Term Goal 2 (Week 1): Pt will perform standing transfers with consistant distant supervision to no AD. PT Short Term Goal 3 (Week 1): Pt will ambulate without RW with overall supervision and improved smoothness of gait cycle. PT Short Term Goal 4 (Week 1): Pt will perform stairs with overall distant supervision. PT Short Term Goal 5 (Week 1): Pt will complete Berg Balance test for additional outcome measure.  SESSION 1 Skilled Therapeutic Interventions/Progress Updates: Patient reclined in bed (supine) on entrance to room. Patient alert and agreeable to PT session.   Patient reported chronic pain in R shoulder (unrated) from past injury.   Therapeutic Activity: Bed Mobility: Pt performed supine<>sit on EOB with supervision for safety (HOB elevated). No VC required this session. Transfers: Pt performed sit<>stand transfers throughout session with RW and with. Provided VC for anterior scoot.  - 5 x STS (22 seconds). Pt performed on hi/low mat (lowered to bottom) and with B UE's crossed in front of chest.  Gait Training:  Pt ambulated from room to day room gym using RW with close supervision for safety. Pt with forward flexed posture and noted slight genu recurvatum in R knee with pt noting some feelings of it.  Pt ambulated around day room and nsg loop with RW and supervision. Pt with cues to take intentional steps to control R knee hyperextension with demo from PTA of going through swing phase on L LE to show where pt hyperextends in the gait cycle.   Neuromuscular Re-ed: NMR facilitated during session with focus on neuromuscular  control and balance during dynamic standing activities. - R quad set in RW while standing. PTA with green theraband pulling into flexion with cues for pt to control eccentric portion. Pt then performed same movement, this time with green theraband pulling into extension. - Pt standing on airex pad with cues to use R UE to cross midline in order to pick up bean bag on L side, and to throw with R.   NMR performed for improvements in motor control and coordination, balance, sequencing, judgement, and self confidence/ efficacy in performing all aspects of mobility at highest level of independence.   Patient sitting EOB at end of session with brakes locked, bed alarm set, and all needs within reach.  SESSION 2 Skilled Therapeutic Interventions/Progress Updates: Patient sitting on EOB on entrance to room. Patient alert and agreeable to PT session.   Patient reported no change in status or pain since previous session.  Therapeutic Activity: Transfers: Pt performed sit<>stand transfers throughout session with supervision for safety.  Gait Training:  Pt ambulated 400'+ on noncompliant surfaces outside using RW with supervision with intermittent CGA for safety. Pt provided with VC to control eccentric quad activation during midstance on R LE to avoid hyperextension. Pt with mod cues throughout to actively think about controlling musculature required. Pt also provided with VC to keep RW within safe proximity vs having forward flexed posture. Pt demos supervision when navigating community obstacles such as stepping over trash with good clearance and sitting on a bench to conserve energy to ambulate further.   Neuromuscular Re-ed: NMR facilitated  during session with focus on coordination, balance, and proprioceptive feedback on R LE. - Pt standing on airex pad with cues to use R UE to cross midline in order to pick up bean bag on L side, and to throw with R.  - Toe taps to green cone. Pt to tap with L LE  until fatigue to increase WB on R LE. Pt with light/min A to maintain balance.   NMR performed for improvements in motor control and coordination, balance, sequencing, judgement, and self confidence/ efficacy in performing all aspects of mobility at highest level of independence.   Patient sitting EOB at end of session with brakes locked, bed alarm set, and all needs within reach.       Therapy Documentation Precautions:  Precautions Precautions: Fall Precaution Comments: RUE and RLE ataxia, L quad pain from fall Restrictions Weight Bearing Restrictions: No  Therapy/Group: Individual Therapy  Ofilia Rayon PTA 01/09/2023, 3:08 PM

## 2023-01-09 NOTE — Progress Notes (Signed)
PROGRESS NOTE   Subjective/Complaints:  In PT, concerned about financial cost of warfarin   ROS: Patient denies CP, SOB, N/V/D  Objective:   No results found. Recent Labs    01/08/23 0530  WBC 11.9*  HGB 14.8  HCT 43.3  PLT 174   Recent Labs    01/08/23 0530  NA 139  K 4.1  CL 106  CO2 22  GLUCOSE 115*  BUN 22  CREATININE 1.20  CALCIUM 9.0    Intake/Output Summary (Last 24 hours) at 01/09/2023 0810 Last data filed at 01/09/2023 8657 Gross per 24 hour  Intake 717 ml  Output --  Net 717 ml        Physical Exam: Vital Signs Blood pressure (!) 153/85, pulse (!) 57, temperature 98.6 F (37 C), resp. rate 17, height 5\' 8"  (1.727 m), weight 95.3 kg, SpO2 97%.   General: No acute distress Mood and affect are appropriate Heart: Regular rate and rhythm no rubs murmurs or extra sounds Lungs: Clear to auscultation, breathing unlabored, no rales or wheezes Abdomen: Positive bowel sounds, soft nontender to palpation, nondistended Extremities: No clubbing, cyanosis, or edema Skin: No evidence of breakdown, no evidence of rash   Visual fields intact  Neurologic: Cranial nerves II through XII intact, motor strength is 5/5 in left 4/5 right  deltoid, bicep, tricep, grip, 5/5 B hip flexor, knee extensors, ankle dorsiflexor and plantar flexor Sensory exam normal sensation to light touch and proprioception in bilateral upper and lower extremities Cerebellar exam dysmetria RIght  finger to nose to finger  Musculoskeletal: Functional active motion in all 4 extremities. No joint swelling. Right thigh pain.   Assessment/Plan: 1. Functional deficits which require 3+ hours per day of interdisciplinary therapy in a comprehensive inpatient rehab setting. Physiatrist is providing close team supervision and 24 hour management of active medical problems listed below. Physiatrist and rehab team continue to assess barriers to  discharge/monitor patient progress toward functional and medical goals  Care Tool:  Bathing    Body parts bathed by patient: Right arm, Left upper leg, Right lower leg, Left arm, Left lower leg, Chest, Abdomen, Front perineal area, Face, Buttocks, Right upper leg         Bathing assist Assist Level: Supervision/Verbal cueing     Upper Body Dressing/Undressing Upper body dressing   What is the patient wearing?: Pull over shirt    Upper body assist Assist Level: Set up assist    Lower Body Dressing/Undressing Lower body dressing      What is the patient wearing?: Underwear/pull up, Pants     Lower body assist Assist for lower body dressing: Minimal Assistance - Patient > 75%     Toileting Toileting Toileting Activity did not occur (Clothing management and hygiene only): N/A (no void or bm)  Toileting assist Assist for toileting: Minimal Assistance - Patient > 75%     Transfers Chair/bed transfer  Transfers assist     Chair/bed transfer assist level: Contact Guard/Touching assist     Locomotion Ambulation   Ambulation assist      Assist level: Contact Guard/Touching assist Assistive device: Walker-rolling Max distance: 150   Walk 10 feet activity  Assist     Assist level: Contact Guard/Touching assist Assistive device: Walker-rolling   Walk 50 feet activity   Assist    Assist level: Contact Guard/Touching assist Assistive device: Walker-rolling    Walk 150 feet activity   Assist    Assist level: Contact Guard/Touching assist Assistive device: Walker-rolling    Walk 10 feet on uneven surface  activity   Assist Walk 10 feet on uneven surfaces activity did not occur: Safety/medical concerns         Wheelchair     Assist Is the patient using a wheelchair?: Yes Type of Wheelchair: Manual    Wheelchair assist level: Dependent - Patient 0% Max wheelchair distance: 249ft    Wheelchair 50 feet with 2 turns  activity    Assist        Assist Level: Dependent - Patient 0%   Wheelchair 150 feet activity     Assist      Assist Level: Dependent - Patient 0%   Blood pressure (!) 153/85, pulse (!) 57, temperature 98.6 F (37 C), resp. rate 17, height 5\' 8"  (1.727 m), weight 95.3 kg, SpO2 97%.  Medical Problem List and Plan: 1. Functional deficits secondary to left thalamic and occipital infarcts             -patient may shower             -ELOS/Goals: 5-7 days, mod I goals with PT and OT   -Continue CIR therapies including PT, OT  2.  Antithrombotics: -DVT/anticoagulation:  Pharmaceutical: Eliquis             -antiplatelet therapy: none   3. Pain Management: Tylenol as needed             -continue gabapentin 300 mg q HS              -heat or ice prn for left tight discomfort--pt seems to be tolerating activity 4. Mood/Behavior/Sleep: LCSW to evaluate and provide emotional support             -continue bupropion 200 mg q AM and 100 mg q HS for depression history             -antipsychotic agents: n/a   5. Neuropsych/cognition: This patient is capable of making decisions on his own behalf.   6. Skin/Wound Care: Routine skin care checks   7. Fluids/Electrolytes/Nutrition: Routine Is and Os and follow-up chemistries             -continue B12   I personally reviewed all of the patient's labs today, and lab work is within normal limits.  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 Bertis Ruddy   13: DM-2: CBG QID; at home on metformin 500 mg daily             -  continue SSI, monitor intake and consider restarting metformin CBG (last 3)  Recent Labs    01/08/23 1644 01/08/23 2118 01/09/23 0657  GLUCAP 103* 136* 116*   8/20 controlled     14: Mild leukocytosis: afebrile, improving             -8/19 WBC's trending down--11.9 today              15: CAD: stable    16: Severe AS s/p TAVR--exercise tolerance reasonable at present    LOS: 4 days A FACE TO FACE EVALUATION WAS PERFORMED  Erick Colace 01/09/2023, 8:10 AM

## 2023-01-09 NOTE — Progress Notes (Signed)
Occupational Therapy Session Note  Patient Details  Name: Rodney Friedman MRN: [id number] Date of Birth: [birth year]  Today's Date: 01/09/2023 OT Individual Time: 0906-1002 session 1 OT Individual Time Calculation (min): 56 min  Session 2: 8469-6295   Short Term Goals: Week 1:  OT Short Term Goal 1 (Week 1): Pt will complete LB dressing with supervision. OT Short Term Goal 2 (Week 1): Pt will complete toileting task with supervision. OT Short Term Goal 3 (Week 1): Pt will ambulate in room to retrieve items for ADLs with supevision.  Skilled Therapeutic Interventions/Progress Updates:   Session 1: Pt greeted EOB with NT, pt in pursuit to bathroom, pt completed ambulatory transfer to bathroom with Rw and CGA. Pt completed 3/3 toileting tasks with supervision. Pt stood at sink for hand hygiene and oral care with supervision, pt does lean on sink during tasks but reports its d/t back pain. Pt completed functional ambulation to ADL apt with RW and supervision. Pt completed ambulatory transfers to recliner, low couch, flat HOB and walkin shower with no more than CGA nearing supervision.   Pt has walkin shower with shower seat and grab bar on R side when entering shower, pt does report he can bring RW into shower if needed. Discussed various energy conservation strategies for home/iADLs. Recommended pt have groceries delivered via [employer], discussed use of reacher for IADLS as well as Rw bag to transport ADL items. Education provided on compensatory methods for iADLS such as sitting to vacuum/mop, breaking up cleaning tasks into separate days, sitting to meal prep/wash dishes, planning/prioritizing IADL tasks for the day.   Pt does report issue of bringing his trash cans out to the street on Mondays, pt was able to demo walking up/down ramp as his driveway is inclined with cane- pt has cane at home. Pt does report he has some friends from [place] that can assist with "a few things."   Pt transported back  to room in w/c for time mgmt with pt left seated EOB with all needs within reach.    Session 2: pt greeted supine in bed with pt agreeable to OT intervention. Pt completed supine>sit with supervision. Supervision for ambulatory transfer to gym with RW. Pt completed IADL task where pt instructed to ambulate around gym to retrieve ADL items from various surface heights ( below knee level) with Rw to simulate cleaning task. Pt completed task with supervision with no LOB, offered use of reacher with pt declining. Issued pt handout on enery conservation to reinforce education. Pt completed functional ambulation back to room with pt left seated EOB eating lunch.   Therapy Documentation Precautions:  Precautions Precautions: Fall Precaution Comments: RUE and RLE ataxia, L quad pain from fall Restrictions Weight Bearing Restrictions: No  Pain: Session 1: unrated back pain, rest breaks provided as needed.  Session 2: no pain reported     Therapy/Group: Individual Therapy  [name] [facility] 01/09/2023, 10:12 AM

## 2023-01-10 LAB — GLUCOSE, CAPILLARY
Glucose-Capillary: 117 mg/dL — ABNORMAL HIGH (ref 70–99)
Glucose-Capillary: 95 mg/dL (ref 70–99)

## 2023-01-10 NOTE — Progress Notes (Signed)
Patient ID: Rodney Friedman, male   DOB: 15-Jun-1945, 77 y.o.   MRN: [id number]  This SW covering for primary SW, [employer].   SW met with pt in room at bedside to provide updates from team conference, and d/c date 8/24. Preferred H[facility] being [facility]. SW will follow-up about referral. Pt will complete Eliquis financial assistance application and SW will fax once completed. SW discussed transportation to appointments at discharge. States that he will work on asking his friends. SW will complete [facility] [place] application. SW reminded while this takes time, he should arrange transportation until he is informed if accepted for services. Pt confirms friend will pick him up on Saturday. He is aware SW will follow-up with his son.    1459-SW spoke with pt son Rodney Friedman to inform on above. SW encouraged assistance with transportation if he is able too.   SW sent HHPT/OT referral to [facility] and waiting on follow-up.    Cecile Sheerer, MSW, LCSWA Office: [phone] Cell: [phone] Fax: [phone]

## 2023-01-10 NOTE — Progress Notes (Signed)
PROGRESS NOTE   Subjective/Complaints:  No issues overnite , seen in OT  ROS: Patient denies CP, SOB, N/V/D  Objective:   No results found. Recent Labs    01/08/23 0530  WBC 11.9*  HGB 14.8  HCT 43.3  PLT 174   Recent Labs    01/08/23 0530  NA 139  K 4.1  CL 106  CO2 22  GLUCOSE 115*  BUN 22  CREATININE 1.20  CALCIUM 9.0    Intake/Output Summary (Last 24 hours) at 01/10/2023 0850 Last data filed at 01/09/2023 1753 Gross per 24 hour  Intake 476 ml  Output --  Net 476 ml        Physical Exam: Vital Signs Blood pressure (!) 154/76, pulse 64, temperature 97.8 F (36.6 C), resp. rate 15, height 5\' 8"  (1.727 m), weight 95.3 kg, SpO2 100%.   General: No acute distress Mood and affect are appropriate Heart: Regular rate and rhythm no rubs murmurs or extra sounds Lungs: Clear to auscultation, breathing unlabored, no rales or wheezes Abdomen: Positive bowel sounds, soft nontender to palpation, nondistended Extremities: No clubbing, cyanosis, or edema Skin: No evidence of breakdown, no evidence of rash   Visual fields intact  Neurologic: Cranial nerves II through XII intact, motor strength is 5/5 in left 4/5 right  deltoid, bicep, tricep, grip, 5/5 B hip flexor, knee extensors, ankle dorsiflexor and plantar flexor Sensory exam normal sensation to light touch and proprioception in bilateral upper and lower extremities Cerebellar exam dysmetria RIght  finger to nose to finger  Musculoskeletal: Functional active motion in all 4 extremities. No joint swelling. Right thigh pain.   Assessment/Plan: 1. Functional deficits which require 3+ hours per day of interdisciplinary therapy in a comprehensive inpatient rehab setting. Physiatrist is providing close team supervision and 24 hour management of active medical problems listed below. Physiatrist and rehab team continue to assess barriers to discharge/monitor  patient progress toward functional and medical goals  Care Tool:  Bathing    Body parts bathed by patient: Right arm, Left upper leg, Right lower leg, Left arm, Left lower leg, Chest, Abdomen, Front perineal area, Face, Buttocks, Right upper leg         Bathing assist Assist Level: Supervision/Verbal cueing     Upper Body Dressing/Undressing Upper body dressing   What is the patient wearing?: Pull over shirt    Upper body assist Assist Level: Set up assist    Lower Body Dressing/Undressing Lower body dressing      What is the patient wearing?: Underwear/pull up, Pants     Lower body assist Assist for lower body dressing: Minimal Assistance - Patient > 75%     Toileting Toileting Toileting Activity did not occur (Clothing management and hygiene only): N/A (no void or bm)  Toileting assist Assist for toileting: Supervision/Verbal cueing     Transfers Chair/bed transfer  Transfers assist     Chair/bed transfer assist level: Contact Guard/Touching assist     Locomotion Ambulation   Ambulation assist      Assist level: Contact Guard/Touching assist Assistive device: Walker-rolling Max distance: 150   Walk 10 feet activity   Assist     Assist  level: Contact Guard/Touching assist Assistive device: Walker-rolling   Walk 50 feet activity   Assist    Assist level: Contact Guard/Touching assist Assistive device: Walker-rolling    Walk 150 feet activity   Assist    Assist level: Contact Guard/Touching assist Assistive device: Walker-rolling    Walk 10 feet on uneven surface  activity   Assist Walk 10 feet on uneven surfaces activity did not occur: Safety/medical concerns         Wheelchair     Assist Is the patient using a wheelchair?: Yes Type of Wheelchair: Manual    Wheelchair assist level: Dependent - Patient 0% Max wheelchair distance: 226ft    Wheelchair 50 feet with 2 turns activity    Assist        Assist  Level: Dependent - Patient 0%   Wheelchair 150 feet activity     Assist      Assist Level: Dependent - Patient 0%   Blood pressure (!) 154/76, pulse 64, temperature 97.8 F (36.6 C), resp. rate 15, height 5\' 8"  (1.727 m), weight 95.3 kg, SpO2 100%.  Medical Problem List and Plan: 1. Functional deficits secondary to left thalamic and occipital infarcts             -patient may shower             -ELOS/Goals: 5-7 days, mod I goals with PT and OT   -Continue CIR therapies including PT, OT . Team conference today please see physician documentation under team conference tab, met with team  to discuss problems,progress, and goals. Formulized individual treatment plan based on medical history, underlying problem and comorbidities.  2.  Antithrombotics: -DVT/anticoagulation:  Pharmaceutical: Eliquis             -antiplatelet therapy: none   3. Pain Management: Tylenol as needed             -continue gabapentin 300 mg q HS              -heat or ice prn for left tight discomfort--pt seems to be tolerating activity 4. Mood/Behavior/Sleep: LCSW to evaluate and provide emotional support             -continue bupropion 200 mg q AM and 100 mg q HS for depression history             -antipsychotic agents: n/a   5. Neuropsych/cognition: This patient is capable of making decisions on his own behalf.   6. Skin/Wound Care: Routine skin care checks   7. Fluids/Electrolytes/Nutrition: Routine Is and Os and follow-up chemistries             -continue B12   I personally reviewed all of the patient's labs today, and lab work is within normal limits.  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

## 2023-01-10 NOTE — Progress Notes (Signed)
Physical Therapy Session Note  Patient Details  Name: Rodney Friedman MRN: [id number] Date of Birth: 06/29/1945  Today's Date: 01/10/2023 PT Individual Time: 1506-1602 PT Individual Time Calculation (min): 56 min   Short Term Goals: Week 1:  PT Short Term Goal 1 (Week 1): pt will perform sup>sit on EOB with Mod I in under . PT Short Term Goal 2 (Week 1): Pt will perform standing transfers with consistant distant supervision to no AD. PT Short Term Goal 3 (Week 1): Pt will ambulate without RW with overall supervision and improved smoothness of gait cycle. PT Short Term Goal 4 (Week 1): Pt will perform stairs with overall distant supervision. PT Short Term Goal 5 (Week 1): Pt will complete Berg Balance test for additional outcome measure.  Skilled Therapeutic Interventions/Progress Updates:  Patient seated upright on EOB on entrance to room. Patient alert and agreeable to PT session.   Patient with no pain complaint at start of session.  D/t upcoming d/c, session with focus on problem solving potential needs or additional training in order to safely prep for intermittent supervision and increased need for Mod I LOA.   Therapeutic Activity/ NMR: Transfers: Pt performed sit<>stand and stand pivot transfers throughout session with distant supervision/ Mod I. No vc provided for technique.  NMR facilitated during session with focus on standing balance and motor control. Pt guided in sit<>stand using proper weight shifting technique and improved motor control of balance and BLE equally. Pt guided in controlled rise to stand and descent to sit from rolling stool. Blocked practice performed with improved technique throughout. First two reps with movement of stool and then complete control demonstrated with no movement of rolling stool.   NMR performed for improvements in motor control and coordination, balance, sequencing, judgement, and self confidence/ efficacy in performing all aspects of  mobility at highest level of independence.   Gait Training:  Pt ambulated >200 ft x1 using RW. Overall close supervision provided with RW d/t good stability. Reminded once re: appropriate proximity to walker and decreasing BUE pressure into walker.   Disc with pt re: problem solving mobility in home prior to discharge. Brought up with pt that he may not have assist for all in/ out of home mobility and how would he solve issue of not being able to use or carry RW up/ down steps to enter/ exit home. Reminded pt that he has SPC and one retrieved for stair training.   Pt ascends/ descends four 6" steps x3 using SPC use in one hand and HR in other. Completes with supervision and good technique with minimal cueing. Returns to room from main gym using [facility]. With SPC, pt with good balance and one instance of misstep d/t fatigue at end of session. Reminded of [facility] for short distance use and more appropriate use of RW in home and community for now.   Patient seated EOB at end of session with brakes locked, bed alarm set, and all needs within reach.   Therapy Documentation Precautions:  Precautions Precautions: Fall Precaution Comments: RUE and RLE ataxia, L quad pain from fall Restrictions Weight Bearing Restrictions: No  Pain:  With fatigue, pt relates increased weakness in BLE from lumbar spine stenosis but no related pain.   Therapy/Group: Individual Therapy  Loel Dubonnet PT, DPT, CSRS 01/10/2023, 5:33 PM

## 2023-01-10 NOTE — Patient Care Conference (Signed)
Inpatient RehabilitationTeam Conference and Plan of Care Update Date: 01/10/2023   Time: 10:10 AM    Patient Name: Rodney Friedman      Medical Record Number: [id number]  Date of Birth: 09-27-1945 Sex: Male         Room/Bed: 4W03C/4W03C-01 Payor Info: Payor: MEDICARE / Plan: MEDICARE PART A AND B / Product Type: *No Product type* /    Admit Date/Time:  01/05/2023  4:38 PM  Primary Diagnosis:  Thalamic infarction [facility])  Hospital Problems: Principal Problem:   Thalamic infarction [facility])    Expected Discharge Date: Expected Discharge Date: 01/13/23  Team Members Present: Physician leading conference: Dr. Claudette Laws Social Worker Present: Cecile Sheerer, LCSWA Nurse Present: Chana Bode, RN PT Present: Ralph Leyden, PT OT Present: Bonnell Public, OT SLP Present: Everardo Pacific, SLP PPS Coordinator present : Fae Pippin, SLP     Current Status/Progress Goal Weekly Team Focus  Bowel/Bladder   patient is continent of bowel and bladder   remain continent of bowel and bladder and ambulate safely to bathroom.   continue PT to strengthen ability to ambulate    Swallow/Nutrition/ Hydration               ADL's   supervision- CGA for ambulatory ADLs with RW   MODI   IADLS, dynamic balance, AE education,    Mobility   Bed = supervision/mod I; Transfer = supervision/mod I; ambulation = supervision with RW   Mod I  Barriers = mild R hemi;  Weekly focus = ambulation endurance, balance, R NMRE, decrease R genu recurvatum    Communication                Safety/Cognition/ Behavioral Observations               Pain   Patient rarely complains of pain.  Uses 1-10 pain scale appropriately.   Continue to use 1-10 pain scale.   Srat ahead of pain when present. Treat mild pain apprpriately.    Skin   Patient in. Some scattered abraisions over body.   Avoid new skin breakdown  promote nutrition booklets to patient.      Discharge Planning:  Patient  discharging back home at MOD I level. Son unable to assist 24/7 supervision. Friends able to assist on PRN basis. 4 steps to enter the home. Patient currently has a: RW, [facility], Technical sales engineer.   Team Discussion: Patient admitted post thalamic infarct with labile blood pressures.  Progress impaired by balance issues and hyperextension of the knee.  Patient on target to meet rehab goals: yes, currently needs supervision - CGA for ADLs. Able to ambulate with supervision.  Goals for discharge set for mod I overall.  *See Care Plan and progress notes for long and short-term goals.   Revisions to Treatment Plan:  N/a   Teaching Needs: Safety, medications, dietary modifications, transfers, toileting, etc.   Current Barriers to Discharge: Home enviroment access/layout and Lack of/limited family support  Possible Resolutions to Barriers: Family Education HH follow up services No DME needs     Medical Summary Current Status: Mildly labile BP , concerns about Eliquis cost  Barriers to Discharge: Medical stability;Other (comments)  Barriers to Discharge Comments: medication cost, no help at home Possible Resolutions to Barriers/Weekly Focus: 30d suppley and pharmacy benefit info shared with pt, needs   Continued Need for Acute Rehabilitation Level of Care: The patient requires daily medical management by a physician with specialized training in physical medicine and  rehabilitation for the following reasons: Direction of a multidisciplinary physical rehabilitation program to maximize functional independence : Yes Medical management of patient stability for increased activity during participation in an intensive rehabilitation regime.: Yes Analysis of laboratory values and/or radiology reports with any subsequent need for medication adjustment and/or medical intervention. : Yes   I attest that I was present, lead the team conference, and concur with the assessment and plan of  the team.   Chana Bode B 01/10/2023, 1:46 PM

## 2023-01-10 NOTE — Progress Notes (Signed)
Occupational Therapy Session Note  Patient Details  Name: Rodney Friedman MRN: [id number] Date of Birth: 01/26/1946  Today's Date: 01/10/2023 OT Individual Time: 6962-9528 session 1 OT Individual Time Calculation (min): 45 min  Session 2: 4132-4401   Short Term Goals: Week 1:  OT Short Term Goal 1 (Week 1): Pt will complete LB dressing with supervision. OT Short Term Goal 2 (Week 1): Pt will complete toileting task with supervision. OT Short Term Goal 3 (Week 1): Pt will ambulate in room to retrieve items for ADLs with supevision.  Skilled Therapeutic Interventions/Progress Updates:  Session 1: Pt greeted seated EOB, pt agreeable to OT intervention. Session focus on BADL reeducation, functional mobility, dynamic standing balance and decreasing overall caregiver burden.     Pt initially concerned he will have to stay longer in CIR than he feels like he needs however alerted pt that target DC date set for 8/24. Pt appreciative of this information and agreeable to target date. Pt completed ambulatory transfer to walkin shower with Rw and supervision, pt particular about ADLs/privacy therefore allowed for close supervision for bathing from shower. Pt donned new brief from shower seat with supervision but MIN cues for technique as pt does better when he figure 4s RLE first and then LLE. Pt exited shower with supervision with RW and completed dressing from EOB with supervision.  Discussed DME needs with pt having no DME needs at this time.                    Ended session with pt seated EOB  with all needs within reach.    Session 2: pt greeted seated EOB, pt agreeable to OT intervention. Pt completed ambulatory transfer to bathroom with Rw MODI, pt with continent urine void able to complete 3/3 toileting tasks MODI. Pt exited bathroom in same manner, pt stood at sink for hand hygiene MODI. Pt completed functional ambulation down to gym with Rw and supervision.  Pt completed laundry task with  reacher and Rw bag with pt instructed to gather washcloths from floor level with reacher and transport items into walker bag then to washer/dryer. Pt completed task with distant supervision, MIN cues for energy conservation strategies such as using pods vs heavy detergent and using reacher to transport items from washer>dryer.   Functional cognition further assessed with The Pillbox Test: A Measure of Executive Functioning and Estimate of Medication Management. A straight pass/fail designation is determined by 3 or more errors of omission or misplacement on the task. The pt completed the test with less than 3 errors and passed the assessment.   Ended session with pt seated EOB with all needs within reach.      Therapy Documentation Precautions:  Precautions Precautions: Fall Precaution Comments: RUE and RLE ataxia, L quad pain from fall Restrictions Weight Bearing Restrictions: No    Pain: Session 1: unrated pain in back and LLE, rest breaks and repositioning provided as needed.  Session 2: unrated back pain reported, rest breaks provided throughout session.    Therapy/Group: Individual Therapy  [name] [facility] 01/10/2023, 12:05 PM

## 2023-01-10 NOTE — Progress Notes (Signed)
Physical Therapy Session Note  Patient Details  Name: Rodney Friedman MRN: [id number] Date of Birth: [birth year]  Today's Date: 01/10/2023 PT Individual Time: 0805-0905 PT Individual Time Calculation (min): 60 min   Short Term Goals: Week 1:  PT Short Term Goal 1 (Week 1): pt will perform sup>sit on EOB with Mod I in under . PT Short Term Goal 2 (Week 1): Pt will perform standing transfers with consistant distant supervision to no AD. PT Short Term Goal 3 (Week 1): Pt will ambulate without RW with overall supervision and improved smoothness of gait cycle. PT Short Term Goal 4 (Week 1): Pt will perform stairs with overall distant supervision. PT Short Term Goal 5 (Week 1): Pt will complete Berg Balance test for additional outcome measure.  Skilled Therapeutic Interventions/Progress Updates: Patient sitting EOB making breakfast order on entrance to room. Patient alert and agreeable to PT session.   Patient reported no pain. Pt reported anxious feelings of potentially not being able to d/c in the next day due to standing appointments regarding prostate cancer, osteoporosis, and therapy. Pt consoled that inpatient length of stay is determined on safety of pt getting to modI as pt lives alone, and that we all want to decrease risks of falling that will set pt back further. Pt in understanding.  Therapeutic Activity: Transfers: Pt performed sit<>stand transfers throughout session with modI. No VC required this session.  Gait Training:  Pt ambulated 200'+ from room to ortho gym using RW with supervision/modI for safety and VC for pt to recall controlling mid-stance eccentric of R LE quads to prevent hyperextension. Pt required a rest break in ortho gym due to reports of still needing to wake up after feeling groggy this morning. Pt then ambulated from ortho gym to main gym in RW with no SOB.   Stair Navigation: - Ascending 4 (6") steps with use of BHR, and with supervision/modI. Pt with step to  pattern. No VC required this session. X 2 rounds - Descending 4 (6") steps with use of BHR, and with supervision/modI. Pt with step to pattern. No VC required this session. X 2 rounds  NMR performed for improvements in motor control and coordination, balance, sequencing, judgement, and self confidence/ efficacy in performing all aspects of mobility at highest level of independence.   Therapeutic Exercise: Pt performed the following exercises with VC provided throughout for right times to inhale and exhale, and to control movement vs letting weight swing UE exremities. - Bicep curl 2 x 12 in sitting starting with B UE in supination during concentric, the pronation during eccentric.  - Sit to stand from hi/low mat with 6lb ball in B UE to chest with cues to step away from mat to avoid using B knees to assist with stand  Patient sitting EOB at end of session with brakes locked, bed alarm set, and all needs within reach.      Therapy Documentation Precautions:  Precautions Precautions: Fall Precaution Comments: RUE and RLE ataxia, L quad pain from fall Restrictions Weight Bearing Restrictions: No  Therapy/Group: Individual Therapy  Anamari Galeas PTA 01/10/2023, 12:22 PM

## 2023-01-11 ENCOUNTER — Other Ambulatory Visit (HOSPITAL_COMMUNITY): Payer: Self-pay

## 2023-01-11 LAB — GLUCOSE, CAPILLARY
Glucose-Capillary: 106 mg/dL — ABNORMAL HIGH (ref 70–99)
Glucose-Capillary: 105 mg/dL — ABNORMAL HIGH (ref 70–99)
Glucose-Capillary: 101 mg/dL — ABNORMAL HIGH (ref 70–99)
Glucose-Capillary: 83 mg/dL (ref 70–99)
Glucose-Capillary: 117 mg/dL — ABNORMAL HIGH (ref 70–99)

## 2023-01-11 MED ORDER — APIXABAN 5 MG PO TABS
5.0000 mg | ORAL_TABLET | Freq: Two times a day (BID) | ORAL | 0 refills | Status: DC
Start: 2023-01-11 — End: 2023-01-18
  Filled 2023-01-11: qty 60, 30d supply, fill #0

## 2023-01-11 MED ORDER — ACETAMINOPHEN 325 MG PO TABS: 325.0000 mg | ORAL_TABLET | ORAL | Status: AC | PRN

## 2023-01-11 MED ORDER — GABAPENTIN 300 MG PO CAPS: 300.0000 mg | ORAL_CAPSULE | Freq: Every day | ORAL | Status: DC

## 2023-01-11 MED ORDER — DOCUSATE SODIUM 100 MG PO CAPS: 100.0000 mg | ORAL_CAPSULE | Freq: Two times a day (BID) | ORAL | Status: DC

## 2023-01-11 NOTE — Progress Notes (Addendum)
Patient ID: Rodney Friedman, male   DOB: Oct 16, 1945, 77 y.o.   MRN: [id number]  This SW covering for primary SW, [employer].   SW still waiting on follow-up about HHPT/OT referral to [facility]. *referral accepted.    SW faxed [employer] [place] application (p:[phone]/f:[phone]) and email application to: [email or web address] .  SW met with pt to discuss Eliquis application and informed will need proof of income. SW provided email and contact information for him to provide as  he will get items to SW once he is home.  SW will order RW for him to use and keep in his car.   SW ordered RW with [employer] via parachute.   SW updated pt on HHA. RW delivered to room.   Cecile Sheerer, MSW, LCSWA Office: [phone] Cell: [phone] Fax: [phone]

## 2023-01-11 NOTE — Progress Notes (Signed)
Physical Therapy Session Note  Patient Details  Name: Rodney Friedman MRN: [id number] Date of Birth: November 10, 1945  Today's Date: 01/11/2023 PT Individual Time: 0806-0905 PT Individual Time Calculation (min): 59 min   Short Term Goals: Week 1:  PT Short Term Goal 1 (Week 1): pt will perform sup>sit on EOB with Mod I in under . PT Short Term Goal 2 (Week 1): Pt will perform standing transfers with consistant distant supervision to no AD. PT Short Term Goal 3 (Week 1): Pt will ambulate without RW with overall supervision and improved smoothness of gait cycle. PT Short Term Goal 4 (Week 1): Pt will perform stairs with overall distant supervision. PT Short Term Goal 5 (Week 1): Pt will complete Berg Balance test for additional outcome measure.  Skilled Therapeutic Interventions/Progress Updates: Patient sitting EOB on entrance to room. Patient alert and agreeable to PT session.   Patient reported no pain at beginning of PT session.   Therapeutic Activity: Transfers: Pt performed sit<>stand transfers throughout session with modI. No VC required.   - Pt ambulated from room to main gym with RW and with modI. Pt then ambulated from main gym to room at end of session with SPC (min cues for sequence - pt with no LOB and with supervision/modI). Pt ambulated during session to increase functional ambulation when out in community/home.  Stair Navigation: - Ascending 4 (6") steps with use of RHR, and with SPC on LUE. Pt with supervision at first for safety, then progressed to modI. Pt with step to pattern. - Descending 4 (6") steps with use of RHR, and with SPC on LUE. Pt with supervision at first for safety of DME use with cues to avoid placing can on step first to prevent excess forward flexion, otherwise pt demos safe navigation of stairs.   NMR performed for improvements in motor control and coordination, balance, sequencing, judgement, and self confidence/ efficacy in performing all aspects of  mobility at highest level of independence.   Therapeutic Exercise: Pt performed the following exercises with therapist providing the described cuing and facilitation for improvement. - Sit to stand with tidal tank x 12 with cue to step away from mat to avoid using edge for assistance behind knees - Beach ball hits with 3lb weighted bar 25 seconds  Circuit  - Sit to stand with tidal tank x 12  - Beach ball hits with 3lb weighted bar 25 seconds. Pt required rest break before next circuit  - Standing bicep curl (start in supination for concentric, then pronation for eccentric)  Patient sitting EOB at end of session with brakes locked, bed alarm set, and all needs within reach.      Therapy Documentation Precautions:  Precautions Precautions: Fall Precaution Comments: RUE and RLE ataxia, L quad pain from fall Restrictions Weight Bearing Restrictions: No  Therapy/Group: Individual Therapy  Sundiata Ferrick PTA 01/11/2023, 12:35 PM

## 2023-01-11 NOTE — Progress Notes (Signed)
Occupational Therapy Session Note  Patient Details  Name: Rodney Friedman MRN: [id number] Date of Birth: November 24, 1945  Today's Date: 8/22/2024and 1310-1350 OT Individual Time: 1000-1100  OT Individual Time Calculation (min): 60 min  and 40 min  Today's Date: 01/11/2023 OT Missed Time: 15 Minutes Missed Time Reason: Other (comment);Unavailable (comment) (pt on phone with insurance company gathering information needed by Child psychotherapist this morning)   Short Term Goals: Week 1:  OT Short Term Goal 1 (Week 1): Pt will complete LB dressing with supervision. OT Short Term Goal 2 (Week 1): Pt will complete toileting task with supervision. OT Short Term Goal 3 (Week 1): Pt will ambulate in room to retrieve items for ADLs with supevision.      Skilled Therapeutic Interventions/Progress Updates:     Visit 1: no pain   Pt received EOB but was in the middle of a phone call with insurance that could not be interrupted.  Once he finished, pt stated he was dressed for the day and did not need to shower today. Pt did ambulate in room with rollator and used toilet with mod I.  Washed hands at sink with mod I in standing.   Pt ambulated a good distance over 300 ft to gym demonstrating good endurance. Discussed and problem solved how to perform high level IADLs. Set up a simulation where pt practiced pulling a weighted block with a rope to simulate pulling out his trashcan to the road. Practiced balance skills with having the force of the weighted block pulling him back. Pt did quite well with this activity.  Worked on standing balance and control for back pain prevention with picking up a heavy weight.  No difficulty.  He worked on standing balance as he worked on using heavy 10 lb wt for elbow exercises.  Will review his HEP routine next session. Pt returned to room. Pt is now mod I in his room, discussed this status with PT and Nursing.  Visit 2: no c/o pain  At start of session, pt finishing his lunch. Pt  then completed toileting and washing hands at sink with mod I. Pt ambulated to day room gym with RW.  Pt has a home ex routine in which he used 10 lb hand weights.  Pt showed me the exercises he does.  I taught him correct arm alignment and advised him which arm exercises are safest vs ones that could aggravate his shoulders (due to old injury). Recommend he stick with the weights for biceps and triceps and squats but to use heavy resistance bands for shoulders.  Showed pt a few exercises but session time had run out. Will practice these with pt tomorrow. Pt returned to room with all needs met.   Therapy Documentation Precautions:  Precautions Precautions: Fall Precaution Comments: RUE and RLE ataxia, L quad pain from fall Restrictions Weight Bearing Restrictions: No       Therapy/Group: Individual Therapy  Aryanna Shaver 01/11/2023, 9:11 AM

## 2023-01-11 NOTE — Progress Notes (Signed)
PROGRESS NOTE   Subjective/Complaints:  No issues overnite , seen in PT, doing circuit training Pt wishes to discuss driving , no restrictions prior to CVA No perceptual difficulties noted by PT during mobility activities  ROS: Patient denies CP, SOB, N/V/D  Objective:   No results found. No results for input(s): "WBC", "HGB", "HCT", "PLT" in the last 72 hours.  No results for input(s): "NA", "K", "CL", "CO2", "GLUCOSE", "BUN", "CREATININE", "CALCIUM" in the last 72 hours.   Intake/Output Summary (Last 24 hours) at 01/11/2023 0926 Last data filed at 01/10/2023 1700 Gross per 24 hour  Intake 295 ml  Output --  Net 295 ml        Physical Exam: Vital Signs Blood pressure 132/72, pulse 66, temperature (!) 97.5 F (36.4 C), resp. rate 18, height 5\' 8"  (1.727 m), weight 95.3 kg, SpO2 97%.   General: No acute distress Mood and affect are appropriate Heart: Regular rate and rhythm no rubs murmurs or extra sounds Lungs: Clear to auscultation, breathing unlabored, no rales or wheezes Abdomen: Positive bowel sounds, soft nontender to palpation, nondistended Extremities: No clubbing, cyanosis, or edema Skin: No evidence of breakdown, no evidence of rash   Visual fields intact  EOMI Neurologic: Cranial nerves II through XII intact, motor strength is 5/5 in left 4/5 right  deltoid, bicep, tricep, grip, 5/5 B hip flexor, knee extensors, ankle dorsiflexor and plantar flexor Sensory exam normal sensation to light touch and proprioception in bilateral upper and lower extremities Cerebellar exam dysmetria RIght  finger to nose to finger mild  Musculoskeletal: Functional active motion in all 4 extremities. No joint swelling. Right thigh pain.   Assessment/Plan: 1. Functional deficits which require 3+ hours per day of interdisciplinary therapy in a comprehensive inpatient rehab setting. Physiatrist is providing close team  supervision and 24 hour management of active medical problems listed below. Physiatrist and rehab team continue to assess barriers to discharge/monitor patient progress toward functional and medical goals  Care Tool:  Bathing    Body parts bathed by patient: Right arm, Left upper leg, Right lower leg, Left arm, Left lower leg, Chest, Abdomen, Front perineal area, Face, Buttocks, Right upper leg         Bathing assist Assist Level: Supervision/Verbal cueing     Upper Body Dressing/Undressing Upper body dressing   What is the patient wearing?: Pull over shirt    Upper body assist Assist Level: Set up assist    Lower Body Dressing/Undressing Lower body dressing      What is the patient wearing?: Underwear/pull up, Pants     Lower body assist Assist for lower body dressing: Supervision/Verbal cueing     Toileting Toileting Toileting Activity did not occur (Clothing management and hygiene only): N/A (no void or bm)  Toileting assist Assist for toileting: Supervision/Verbal cueing     Transfers Chair/bed transfer  Transfers assist     Chair/bed transfer assist level: Supervision/Verbal cueing     Locomotion Ambulation   Ambulation assist      Assist level: Supervision/Verbal cueing Assistive device: Walker-rolling Max distance: 200 ft   Walk 10 feet activity   Assist     Assist level: Supervision/Verbal  cueing Assistive device: Cane-straight   Walk 50 feet activity   Assist    Assist level: Supervision/Verbal cueing Assistive device: Walker-rolling    Walk 150 feet activity   Assist    Assist level: Supervision/Verbal cueing Assistive device: Walker-rolling    Walk 10 feet on uneven surface  activity   Assist Walk 10 feet on uneven surfaces activity did not occur: Safety/medical concerns         Wheelchair     Assist Is the patient using a wheelchair?: No Type of Wheelchair: Manual    Wheelchair assist level: Dependent  - Patient 0% Max wheelchair distance: 276ft    Wheelchair 50 feet with 2 turns activity    Assist        Assist Level: Dependent - Patient 0%   Wheelchair 150 feet activity     Assist      Assist Level: Dependent - Patient 0%   Blood pressure 132/72, pulse 66, temperature (!) 97.5 F (36.4 C), resp. rate 18, height 5\' 8"  (1.727 m), weight 95.3 kg, SpO2 97%.  Medical Problem List and Plan: 1. Functional deficits secondary to left thalamic and occipital infarcts             -patient may shower             -ELOS/Goals:8/87mod I goals with PT and OT   -Continue CIR therapies including PT, OT . No driving until PMR re eval ~3wks post d/c  2.  Antithrombotics: -DVT/anticoagulation:  Pharmaceutical: Eliquis             -antiplatelet therapy: none   3. Pain Management: Tylenol as needed             -continue gabapentin 300 mg q HS              -heat or ice prn for left tight discomfort--pt seems to be tolerating activity 4. Mood/Behavior/Sleep: LCSW to evaluate and provide emotional support             -continue bupropion 200 mg q AM and 100 mg q HS for depression history             -antipsychotic agents: n/a   5. Neuropsych/cognition: This patient is capable of making decisions on his own behalf.   6. Skin/Wound Care: Routine skin care checks   7. Fluids/Electrolytes/Nutrition: Routine Is and Os and follow-up chemistries             -continue B12   I personally reviewed all of the patient's labs today, and lab work is within normal limits.  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

## 2023-01-11 NOTE — Evaluation (Signed)
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 Clifton James & Dr. Laneta Simmers   Sleep apnea     Substance abuse (HCC)     Tremor, essential 09/22/2015        Past Surgical History:       Past Surgical History:  Procedure Laterality Date   Arm Surgery Right      from door accident with glass   CARDIAC CATHETERIZATION       CATARACT EXTRACTION W/ INTRAOCULAR LENS IMPLANT Bilateral     COLONOSCOPY       CORONARY ARTERY BYPASS GRAFT   08/04/2004   EYE SURGERY       POLYPECTOMY       RIGHT/LEFT HEART CATH AND CORONARY/GRAFT ANGIOGRAPHY N/A 01/20/2021    Procedure:  RIGHT/LEFT HEART CATH AND CORONARY/GRAFT ANGIOGRAPHY;  Surgeon: Kathleene Hazel, MD;  Location: [facility] INVASIVE CV LAB;  Service: Cardiovascular;  Laterality: N/A;   TEE WITHOUT CARDIOVERSION N/A 02/08/2021    Procedure: TRANSESOPHAGEAL ECHOCARDIOGRAM (TEE);  Surgeon: Kathleene Hazel, MD;  Location: [facility] INVASIVE CV LAB;  Service: Open Heart Surgery;  Laterality: N/A;   TRANSCATHETER AORTIC VALVE REPLACEMENT, CAROTID Left 02/08/2021    Procedure: TRANSCATHETER AORTIC VALVE REPLACEMENT, LEFT CAROTID;  Surgeon: Kathleene Hazel, MD;  Location: [facility] INVASIVE CV LAB;  Service: Open Heart Surgery;  Laterality: Left;          Assessment & Plan Clinical Impression: Patient is a 77 y.o. male who presented to [facility] ED as code stroke on 01/02/2023. He complained of right-sided weakness and facial droop. CT head without any obvious bleed. Maintained on Coumadin for a. fib and was therapeutic. He did not get tenecteplase because of being outside the window and being on Coumadin. No large vessel occlusion on CT. MRI brain completed about  4 hours later did demonstrate acute lacunar infarct of the left thalamus. Tiny acute left occipital cortex infarct. Dr. Royann Shivers, patient's cardiologist was contacted by Dr. Pearlean Brownie and she stated patient was on warfarin for cost issues. Said he can switch to Eliquis. Echo with EF ~50-55%, aortic dilation s/p TAVR. Tolerating carb modified diet. No reports of chest pain or palpitations. Perforemd transfer from multiple surfaces 8/15 with PT with CGA for safety, cures for rail use. CGA to ambulate with RW. Up to min assist for gait without RW. The patient requires inpatient medicine and rehabilitation evaluations and services for ongoing dysfunction secondary to left thalamus infarct.  Patient transferred to CIR on 01/05/2023 .    Pt presents with decreased activity tolerance, decreased functional mobility, decreased balance, decreased coordination Limiting pt's independence  with leisure/community pursuits. Met with pt today to discuss TR services including leisure education, activity analysis/modifications and stress management.  Also discussed the importance of social, emotional, spiritual health in addition to physical health and their effects on overall health and wellness.  Pt stated understanding * appreciation for this visit/education.    Plan  No further TR as pt is d/c 8/24  Recommendations for other services: None   Discharge Criteria: Patient will be discharged from TR if patient refuses treatment 3 consecutive times without medical reason.  If treatment goals not met, if there is a change in medical status, if patient makes no progress towards goals or if patient is discharged from hospital.  The above assessment, treatment plan, treatment alternatives and goals were discussed and mutually agreed upon: by patient  Rodney Friedman 01/11/2023, 3:57 PM

## 2023-01-12 LAB — GLUCOSE, CAPILLARY
Glucose-Capillary: 100 mg/dL — ABNORMAL HIGH (ref 70–99)
Glucose-Capillary: 110 mg/dL — ABNORMAL HIGH (ref 70–99)
Glucose-Capillary: 91 mg/dL (ref 70–99)
Glucose-Capillary: 116 mg/dL — ABNORMAL HIGH (ref 70–99)

## 2023-01-12 NOTE — Progress Notes (Signed)
Inpatient Rehabilitation Discharge Medication Review by a Pharmacist  A complete drug regimen review was completed for this patient to identify any potential clinically significant medication issues.  High Risk Drug Classes Is patient taking? Indication by Medication  Antipsychotic No   Anticoagulant Yes Apixaban - Afib, CVA  Antibiotic Yes Amoxicillin prior to procedures  Opioid No   Antiplatelet No   Hypoglycemics/insulin No   Vasoactive Medication Yes Losartan, Metoprolol XL - HTN  Chemotherapy No   Other Yes Bupropion - mood Ezetimibe, Rosuvastatin- HLD Gabapentin - pain Latanoprost - glaucoma      Type of Medication Issue Identified Description of Issue Recommendation(s)  Drug Interaction(s) (clinically significant)     Duplicate Therapy     Allergy     No Medication Administration End Date     Incorrect Dose     Additional Drug Therapy Needed     Significant med changes from prior encounter (inform family/care partners about these prior to discharge).    Other       Clinically significant medication issues were identified that warrant physician communication and completion of prescribed/recommended actions by midnight of the next day:  No  Name of provider notified for urgent issues identified:   Provider Method of Notification:     Pharmacist comments: None  Time spent performing this drug regimen review (minutes): 20 minutes  Thank you Okey Regal, PharmD

## 2023-01-12 NOTE — Progress Notes (Signed)
PROGRESS NOTE   Subjective/Complaints:  Pt excited about d/c in am   ROS: Patient denies CP, SOB, N/V/D  Objective:   No results found. No results for input(s): "WBC", "HGB", "HCT", "PLT" in the last 72 hours.  No results for input(s): "NA", "K", "CL", "CO2", "GLUCOSE", "BUN", "CREATININE", "CALCIUM" in the last 72 hours.   Intake/Output Summary (Last 24 hours) at 01/12/2023 0930 Last data filed at 01/12/2023 1610 Gross per 24 hour  Intake 700 ml  Output --  Net 700 ml        Physical Exam: Vital Signs Blood pressure (!) 142/89, pulse 64, temperature (!) 97.4 F (36.3 C), resp. rate 18, height 5\' 8"  (1.727 m), weight 95.3 kg, SpO2 92%.   General: No acute distress Mood and affect are appropriate Heart: Regular rate and rhythm no rubs murmurs or extra sounds Lungs: Clear to auscultation, breathing unlabored, no rales or wheezes Abdomen: Positive bowel sounds, soft nontender to palpation, nondistended Extremities: No clubbing, cyanosis, or edema Skin: No evidence of breakdown, no evidence of rash   Visual fields intact  EOMI Neurologic: Cranial nerves II through XII intact, motor strength is 5/5 in left 4/5 right  deltoid, bicep, tricep, grip, 5/5 B hip flexor, knee extensors, ankle dorsiflexor and plantar flexor Sensory exam normal sensation to light touch and proprioception in bilateral upper and lower extremities Cerebellar exam dysmetria RIght  finger to nose to finger mild  Musculoskeletal: Functional active motion in all 4 extremities. No joint swelling. Right thigh pain.   Assessment/Plan: 1. Functional deficits which require 3+ hours per day of interdisciplinary therapy in a comprehensive inpatient rehab setting. Physiatrist is providing close team supervision and 24 hour management of active medical problems listed below. Physiatrist and rehab team continue to assess barriers to discharge/monitor  patient progress toward functional and medical goals  Care Tool:  Bathing    Body parts bathed by patient: Right arm, Left upper leg, Right lower leg, Left arm, Left lower leg, Chest, Abdomen, Front perineal area, Face, Buttocks, Right upper leg         Bathing assist Assist Level: Supervision/Verbal cueing     Upper Body Dressing/Undressing Upper body dressing   What is the patient wearing?: Pull over shirt    Upper body assist Assist Level: Set up assist    Lower Body Dressing/Undressing Lower body dressing      What is the patient wearing?: Underwear/pull up, Pants     Lower body assist Assist for lower body dressing: Supervision/Verbal cueing     Toileting Toileting Toileting Activity did not occur (Clothing management and hygiene only): N/A (no void or bm)  Toileting assist Assist for toileting: Independent with assistive device     Transfers Chair/bed transfer  Transfers assist     Chair/bed transfer assist level: Independent with assistive device     Locomotion Ambulation   Ambulation assist      Assist level: Supervision/Verbal cueing Assistive device: Walker-rolling Max distance: 200 ft   Walk 10 feet activity   Assist     Assist level: Supervision/Verbal cueing Assistive device: Cane-straight   Walk 50 feet activity   Assist    Assist level:  Supervision/Verbal cueing Assistive device: Walker-rolling    Walk 150 feet activity   Assist    Assist level: Supervision/Verbal cueing Assistive device: Walker-rolling    Walk 10 feet on uneven surface  activity   Assist Walk 10 feet on uneven surfaces activity did not occur: Safety/medical concerns         Wheelchair     Assist Is the patient using a wheelchair?: No Type of Wheelchair: Manual    Wheelchair assist level: Dependent - Patient 0% Max wheelchair distance: 266ft    Wheelchair 50 feet with 2 turns activity    Assist        Assist Level:  Dependent - Patient 0%   Wheelchair 150 feet activity     Assist      Assist Level: Dependent - Patient 0%   Blood pressure (!) 142/89, pulse 64, temperature (!) 97.4 F (36.3 C), resp. rate 18, height 5\' 8"  (1.727 m), weight 95.3 kg, SpO2 92%.  Medical Problem List and Plan: 1. Functional deficits secondary to left thalamic and occipital infarcts             -patient may shower             -ELOS/Goals:8/38mod I goals with PT and OT   -Continue CIR therapies including PT, OT . No driving until PMR re eval ~3wks post d/c  2.  Antithrombotics: -DVT/anticoagulation:  Pharmaceutical: Eliquis             -antiplatelet therapy: none   3. Pain Management: Tylenol as needed             -continue gabapentin 300 mg q HS              -heat or ice prn for left tight discomfort--pt seems to be tolerating activity 4. Mood/Behavior/Sleep: LCSW to evaluate and provide emotional support             -continue bupropion 200 mg q AM and 100 mg q HS for depression history             -antipsychotic agents: n/a   5. Neuropsych/cognition: This patient is capable of making decisions on his own behalf.   6. Skin/Wound Care: Routine skin care checks   7. Fluids/Electrolytes/Nutrition: Routine Is and Os and follow-up chemistries             -continue B12   I personally reviewed all of the patient's labs today, and lab work is within normal limits.  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

## 2023-01-12 NOTE — Progress Notes (Signed)
Inpatient Rehabilitation Care Coordinator Discharge Note   Patient Details  Name: Rodney Friedman MRN: [id number] Date of Birth: 07-24-1945   Discharge location: d/c to home with PRN support from son and friends  Length of Stay: 6 days  Discharge activity level: Mod I  Home/community participation: Limited  Patient response WU:JWJXBJ Literacy - How often do you need to have someone help you when you read instructions, pamphlets, or other written material from your doctor or pharmacy?: Never  Patient response YN:WGNFAO Isolation - How often do you feel lonely or isolated from those around you?: Never  Services provided included: MD, RD, PT, OT, SLP, RN, Pharmacy, Neuropsych, SW, CM, TR  Financial Services:  Field seismologist Utilized: Event organiser Supplement (secondary)  Choices offered to/list presented to: patient  Follow-up services arranged:  Home Health, DME Home Health Agency: [facility] for HHPT/OT    DME : [employer] [facility] for RW    Patient response to transportation need: Is the patient able to respond to transportation needs?: Yes In the past 12 months, has lack of transportation kept you from medical appointments or from getting medications?: No In the past 12 months, has lack of transportation kept you from meetings, work, or from getting things needed for daily living?: No   Patient/Family verbalized understanding of follow-up arrangements:  Yes  Individual responsible for coordination of the follow-up plan: contact pt [phone]  Confirmed correct DME delivered: Gretchen Short 01/12/2023    Comments (or additional information):  Summary of Stay    Date/Time Discharge Planning CSW  01/09/23 1309 Patient discharging back home at MOD I level. Son unable to assist 24/7 supervision. Friends able to assist on PRN basis. 4 steps to enter the home. Patient currently has a: RW, [facility], Technical sales engineer. CJB       Ora Mcnatt A Lula Olszewski

## 2023-01-12 NOTE — Plan of Care (Signed)
  Problem: RH Balance Goal: LTG Patient will maintain dynamic standing with ADLs (OT) Description: LTG:  Patient will maintain dynamic standing balance with assist during activities of daily living (OT)  Outcome: Completed/Met   Problem: RH Grooming Goal: LTG Patient will perform grooming w/assist,cues/equip (OT) Description: LTG: Patient will perform grooming with assist, with/without cues using equipment (OT) Outcome: Completed/Met   Problem: RH Bathing Goal: LTG Patient will bathe all body parts with assist levels (OT) Description: LTG: Patient will bathe all body parts with assist levels (OT) Outcome: Completed/Met   Problem: RH Dressing Goal: LTG Patient will perform upper body dressing (OT) Description: LTG Patient will perform upper body dressing with assist, with/without cues (OT). Outcome: Completed/Met Goal: LTG Patient will perform lower body dressing w/assist (OT) Description: LTG: Patient will perform lower body dressing with assist, with/without cues in positioning using equipment (OT) Outcome: Completed/Met   Problem: RH Toileting Goal: LTG Patient will perform toileting task (3/3 steps) with assistance level (OT) Description: LTG: Patient will perform toileting task (3/3 steps) with assistance level (OT)  Outcome: Completed/Met   Problem: RH Simple Meal Prep Goal: LTG Patient will perform simple meal prep w/assist (OT) Description: LTG: Patient will perform simple meal prep with assistance, with/without cues (OT). Outcome: Completed/Met   Problem: RH Laundry Goal: LTG Patient will perform laundry w/assist, cues (OT) Description: LTG: Patient will perform laundry with assistance, with/without cues (OT). Outcome: Completed/Met   Problem: RH Toilet Transfers Goal: LTG Patient will perform toilet transfers w/assist (OT) Description: LTG: Patient will perform toilet transfers with assist, with/without cues using equipment (OT) Outcome: Completed/Met   Problem: RH  Tub/Shower Transfers Goal: LTG Patient will perform tub/shower transfers w/assist (OT) Description: LTG: Patient will perform tub/shower transfers with assist, with/without cues using equipment (OT) Outcome: Completed/Met

## 2023-01-12 NOTE — Progress Notes (Signed)
Physical Therapy Session Note  Patient Details  Name: Rodney Friedman MRN: [id number] Date of Birth: Jul 29, 1945  Today's Date: 01/12/2023 PT Individual Time: 0850-1000 PT Individual Time Calculation (min): 70 min   Short Term Goals: Week 1:  PT Short Term Goal 1 (Week 1): pt will perform sup>sit on EOB with Mod I in under . PT Short Term Goal 2 (Week 1): Pt will perform standing transfers with consistant distant supervision to no AD. PT Short Term Goal 3 (Week 1): Pt will ambulate without RW with overall supervision and improved smoothness of gait cycle. PT Short Term Goal 4 (Week 1): Pt will perform stairs with overall distant supervision. PT Short Term Goal 5 (Week 1): Pt will complete Berg Balance test for additional outcome measure.  Skilled Therapeutic Interventions/Progress Updates: Patient sitting EOB on entrance to room. Patient alert and agreeable to PT session.   Pt reported 6/10 pain in mid to lateral low back on R (pt reported that he would ask nsg if it becomes bothersome. Rest breaks provided prn). Today's session focused on grad day updates and outcome measures. Pt demos increase in endurance of activities involving long distance ambulation, personal drive for self rehabilitation and bettering physical presentation with enthusiasm.   Patient demonstrates increased fall risk as noted by score of   43/56 on Berg Balance Scale.  (<36= high risk for falls, close to 100%; 37-45 significant >80%; 46-51 moderate >50%; 52-55 lower >25%) - Pt has decreased time in TUG test from 36.9 at evaluation to 16.78 (pt use of RW)  Therex: Pt performed gait trial to increase physical endurance for community ambulation. - Pt ambulated from main gym to outside of [facility] with RW and with WC managed by PTA for safety. Pt ambulated 500'+ before needing to take a rest break, and then ambulated back to room (500'+) back to room and required a rest break almost back to room (nsg  station in 4W) with PTA transported pt the rest of the way back to room. Pt SpO2 monitored at each rest break and were WNL.   Patient sitting EOB at end of session with brakes locked, modI and all needs in reach.      Therapy Documentation Precautions:  Precautions Precautions: Fall Precaution Comments: RUE and RLE ataxia Restrictions Weight Bearing Restrictions: No  Therapy/Group: Individual Therapy  Nateisha Moyd PTA 01/12/2023, 12:54 PM

## 2023-01-12 NOTE — Progress Notes (Signed)
Occupational Therapy Session Note  Patient Details  Name: Rodney Friedman MRN: [id number] Date of Birth: 17-May-1946  Today's Date: 01/12/2023 OT Individual Time: 1045-1200 OT Individual Time Calculation (min): 75 min    Short Term Goals: Week 1:  OT Short Term Goal 1 (Week 1): Pt will complete LB dressing with supervision. OT Short Term Goal 2 (Week 1): Pt will complete toileting task with supervision. OT Short Term Goal 3 (Week 1): Pt will ambulate in room to retrieve items for ADLs with supevision.     Skilled Therapeutic Interventions/Progress Updates:    Pt reported that he completed his shower and all dressing/self care this am independently. Pt seen this session to review meal prep and kitchen safety and to review his HEP.  Pt ambulated to gym with Rw.  Pt provided with 3 grades of theraband to use for various shoulder, back, bicep and tricep exercises. Pt given a hand out with the 7 exercises he can work on daily to maintain his strength levels.   He then ambulated to kitchen.  He practiced retrieving heavy items from low cabinets, pantry, and carrying heavy plate in one hand resting on walker handle.  Pt demonstrated with a bundt pan a recipe he likes to make at home and shared it with me.  Pt is feeling prepared and ready to go home tomorrow.    Pt ambulated back to room with all needs met.   Therapy Documentation Precautions:  Precautions Precautions: Fall Weight Bearing Restrictions: No   Pain: Pain Assessment Pain Scale: 0-10 Pain Score: 6  Pain Type: Chronic pain Pain Location: Back Pain Orientation: Right;Medial;Lateral;Lower Pain Descriptors / Indicators: Aching;Sharp Pain Onset: On-going ADL: ADL Eating: Independent Grooming: Independent Upper Body Bathing: Independent Where Assessed-Upper Body Bathing: Shower Lower Body Bathing: Modified independent Where Assessed-Lower Body Bathing: Shower Upper Body Dressing: Independent Where Assessed-Upper Body  Dressing: Edge of bed Lower Body Dressing: Modified independent Where Assessed-Lower Body Dressing: Sitting at sink Toileting: Modified independent Where Assessed-Toileting: Teacher, adult education: Engineer, agricultural Method: Insurance claims handler: Not assessed Film/video editor: Modified independent Film/video editor Method: Designer, industrial/product: Grab bars, Shower seat with back    Therapy/Group: Individual Therapy  Aleni Andrus 01/12/2023, 12:51 PM

## 2023-01-12 NOTE — Progress Notes (Signed)
Physical Therapy Discharge Summary  Patient Details  Name: Yonason Fichter MRN: [id number] Date of Birth: 01-21-1946  Date of Discharge from PT service:{Time; dates multiple:304500300}  {CHL IP REHAB PT TIME CALCULATION:304800500}   Patient has met {NUMBERS 0-12:18577} of {NUMBERS 0-12:18577} long term goals due to {due VO:5366440}.  Patient to discharge at [facility] level {LOA:3049010}.   Patient's care partner {care partner:3041650} to provide the necessary {assistance:3041652} assistance at discharge.  Reasons goals not met: ***  Recommendation:  Patient will benefit from ongoing skilled PT services in {setting:3041680} to continue to advance safe functional mobility, address ongoing impairments in ***, and minimize fall risk.  Equipment: {equipment:3041657}  Reasons for discharge: {Reason for discharge:3049018}  Patient/family agrees with progress made and goals achieved: {Pt/Family agree with progress/goals:3049020}  PT Discharge Precautions/Restrictions Precautions Precautions: Fall Precaution Comments: RUE and RLE ataxia Restrictions Weight Bearing Restrictions: No Vital Signs Therapy Vitals Pulse Rate: 64 Resp: 18 BP: (!) 142/89 Patient Position (if appropriate): Sitting Oxygen Therapy SpO2: 92 % O2 Device: Room Air Pain Pain Assessment Pain Scale: 0-10 Pain Score: 6  Pain Type: Chronic pain Pain Location: Back Pain Orientation: Right;Medial;Lateral;Lower Pain Descriptors / Indicators: Aching;Sharp Pain Onset: On-going Pain Interference Pain Interference Pain Effect on Sleep: 1. Rarely or not at all Pain Interference with Therapy Activities: 2. Occasionally Pain Interference with Day-to-Day Activities: 3. Frequently Vision/Perception     Cognition Overall Cognitive Status: Within Functional Limits for tasks assessed Arousal/Alertness: Awake/alert Orientation Level: Oriented X4 Sensation Sensation Light Touch: Appears Intact Hot/Cold:  Appears Intact Proprioception: Appears Intact Coordination Gross Motor Movements are Fluid and Coordinated: No Fine Motor Movements are Fluid and Coordinated: No Finger Nose Finger Test: mild over and under-shooting with RUE Heel Shin Test: [facility] Motor     Mobility Bed Mobility Bed Mobility: Supine to Sit;Sit to Supine;Rolling Left;Rolling Right Rolling Right: Independent Rolling Left: Independent Supine to Sit: Independent Sit to Supine: Independent Transfers Transfers: Sit to Stand;Stand to Sit;Stand Pivot Transfers Sit to Stand: Independent with assistive device Stand to Sit: Independent with assistive device Stand Pivot Transfers: Independent with assistive device Transfer (Assistive device): Rolling walker Locomotion  Gait Ambulation: Yes Gait Assistance: Independent with assistive device Gait Distance (Feet): 300 Feet Assistive device: Rolling walker Gait Gait: Yes Gait Pattern: Impaired Gait Pattern: Poor foot clearance - left;Poor foot clearance - right;Step-through pattern;Decreased step length - right;Decreased step length - left;Decreased stance time - left High Level Ambulation High Level Ambulation: Backwards walking Backwards Walking: short quick steps that improve slightly with time on task Stairs / Additional Locomotion Stairs: Yes Stairs Assistance: Independent with assistive device Stair Management Technique: Two rails;Alternating pattern Number of Stairs: 8 Height of Stairs: 6 Ramp: Independent with assistive device Curb: Independent with assistive device Wheelchair Mobility Wheelchair Mobility: No  Trunk/Postural Assessment  Cervical Assessment Cervical Assessment: Within Functional Limits Thoracic Assessment Thoracic Assessment: Within Functional Limits Lumbar Assessment Lumbar Assessment: Exceptions to [facility] (posterior pelvic tilt) Postural Control Postural Control: Within Functional Limits  Balance Balance Balance Assessed: Yes Standardized  Balance Assessment Standardized Balance Assessment: Timed Up and Go Test;Berg Balance Test Berg Balance Test Sit to Stand: Able to stand without using hands and stabilize independently Standing Unsupported: Able to stand 2 minutes with supervision Sitting with Back Unsupported but Feet Supported on Floor or Stool: Able to sit safely and securely 2 minutes Stand to Sit: Sits safely with minimal use of hands Transfers: Able to transfer safely, minor use of hands Standing Unsupported with Eyes Closed: Able to stand 10 seconds  with supervision Standing Ubsupported with Feet Together: Able to place feet together independently and stand for 1 minute with supervision From Standing, Reach Forward with Outstretched Arm: Can reach forward >12 cm safely (5") From Standing Position, Pick up Object from Floor: Able to pick up shoe, needs supervision From Standing Position, Turn to Look Behind Over each Shoulder: Looks behind one side only/other side shows less weight shift Turn 360 Degrees: Able to turn 360 degrees safely in 4 seconds or less Standing Unsupported, Alternately Place Feet on Step/Stool: Able to stand independently and safely and complete 8 steps in 20 seconds Standing Unsupported, One Foot in Front: Loses balance while stepping or standing Standing on One Leg: Tries to lift leg/unable to hold 3 seconds but remains standing independently Total Score: 43 Timed Up and Go Test Normal TUG (seconds): 16.78 Static Sitting Balance Static Sitting - Balance Support: Feet supported Static Sitting - Level of Assistance: 7: Independent Dynamic Sitting Balance Dynamic Sitting - Balance Support: Feet supported Dynamic Sitting - Level of Assistance: 7: Independent Static Standing Balance Static Standing - Balance Support: No upper extremity supported Static Standing - Level of Assistance: 7: Independent Dynamic Standing Balance Dynamic Standing - Balance Support: Bilateral upper extremity  supported Dynamic Standing - Level of Assistance: 6: Modified independent (Device/Increase time) Dynamic Standing - Balance Activities: Lateral lean/weight shifting;Ball toss;Reaching across midline Extremity Assessment      RLE Assessment RLE Assessment: Within Functional Limits RLE Strength RLE Overall Strength: Within Functional Limits for tasks assessed Right Hip Flexion: 4/5 Right Hip Extension: 4/5 Right Hip ABduction: 5/5 Right Hip ADduction: 5/5 Right Knee Flexion: 4/5 Right Knee Extension: 5/5 Right Ankle Dorsiflexion: 4/5 Right Ankle Plantar Flexion: 5/5 LLE Assessment LLE Assessment: Within Functional Limits LLE Strength LLE Overall Strength: Within Functional Limits for tasks assessed Left Hip Flexion: 4/5 Left Hip Extension: 4/5 Left Hip ABduction: 5/5 Left Hip ADduction: 5/5 Left Knee Flexion: 4/5 Left Knee Extension: 5/5 Left Ankle Dorsiflexion: 4/5 Left Ankle Plantar Flexion: 5/5   Ardel Jagger PTA  01/12/2023, 9:32 AM

## 2023-01-12 NOTE — Progress Notes (Signed)
Occupational Therapy Discharge Summary  Patient Details  Name: Rodney Friedman MRN: [id number] Date of Birth: [birth year]  Date of Discharge from OT service:January 12, 2023     Patient has met 10 of 10 long term goals due to improved activity tolerance, improved balance, postural control, functional use of  RIGHT upper and RIGHT lower extremity, and improved coordination.  Patient to discharge at overall Modified Independent level.  Patient's care partner unavailable to provide the necessary physical assistance at discharge.  Pt will rely on assist from friends for transportation.   Reasons goals not met: n/a  Recommendation:  Patient will benefit from ongoing skilled OT services in home health setting to continue to advance functional skills in the area of BADL and iADL.  Equipment: Therabands for HEP and walker bag  Reasons for discharge: treatment goals met  Patient/family agrees with progress made and goals achieved: Yes  OT Discharge Precautions/Restrictions  Precautions Precautions: Fall  ADL ADL Eating: Independent Grooming: Independent Upper Body Bathing: Independent Where Assessed-Upper Body Bathing: Shower Lower Body Bathing: Modified independent Where Assessed-Lower Body Bathing: Shower Upper Body Dressing: Independent Where Assessed-Upper Body Dressing: Edge of bed Lower Body Dressing: Modified independent Where Assessed-Lower Body Dressing: Sitting at sink Toileting: Modified independent Where Assessed-Toileting: Teacher, adult education: Engineer, agricultural Method: Insurance claims handler: Not assessed Film/video editor: Modified independent Film/video editor Method: Designer, industrial/product: [employer], Information systems manager with back Vision Baseline Vision/History: 1 Wears glasses Patient Visual Report: No change from baseline Vision Assessment?: No apparent visual deficits Perception  Perception: Within Functional  Limits Praxis Praxis: WFL Cognition Cognition Overall Cognitive Status: Within Functional Limits for tasks assessed Arousal/Alertness: Awake/alert Person: Oriented Place: Oriented Situation: Oriented Focused Attention: Appears intact Sustained Attention: Appears intact Awareness: Appears intact Problem Solving: Appears intact Brief Interview for Mental Status (BIMS) Repetition of Three Words (First Attempt): 3 Temporal Orientation: Year: Correct Temporal Orientation: Month: Accurate within 5 days Temporal Orientation: Day: Correct Recall: "Sock": Yes, no cue required Recall: "Blue": Yes, no cue required Recall: "Bed": Yes, no cue required BIMS Summary Score: 15 Sensation Sensation Light Touch: Appears Intact Hot/Cold: Appears Intact Proprioception: Appears Intact Coordination Gross Motor Movements are Fluid and Coordinated: No Fine Motor Movements are Fluid and Coordinated: No Finger Nose Finger Test: mild over and under-shooting with RUE Heel Shin Test: [facility] Motor  Motor Motor - Discharge Observations: mild ataxia Mobility  Bed Mobility Bed Mobility: Supine to Sit;Sit to Supine;Rolling Left;Rolling Right Rolling Right: Independent Rolling Left: Independent Supine to Sit: Independent Sit to Supine: Independent Transfers Sit to Stand: Independent with assistive device Stand to Sit: Independent with assistive device  Trunk/Postural Assessment  Cervical Assessment Cervical Assessment: Within Functional Limits Thoracic Assessment Thoracic Assessment: Within Functional Limits Lumbar Assessment Lumbar Assessment: Exceptions to [facility] (posterior pelvic tilt) Postural Control Postural Control: Within Functional Limits  Balance Balance Balance Assessed: Yes Standardized Balance Assessment Standardized Balance Assessment: Timed Up and Go Test;Berg Balance Test Berg Balance Test Sit to Stand: Able to stand without using hands and stabilize independently Standing  Unsupported: Able to stand 2 minutes with supervision Sitting with Back Unsupported but Feet Supported on Floor or Stool: Able to sit safely and securely 2 minutes Stand to Sit: Sits safely with minimal use of hands Transfers: Able to transfer safely, minor use of hands Standing Unsupported with Eyes Closed: Able to stand 10 seconds with supervision Standing Ubsupported with Feet Together: Able to place feet together independently and stand for 1 minute with  supervision From Standing, Reach Forward with Outstretched Arm: Can reach forward >12 cm safely (5") From Standing Position, Pick up Object from Floor: Able to pick up shoe, needs supervision From Standing Position, Turn to Look Behind Over each Shoulder: Looks behind one side only/other side shows less weight shift Turn 360 Degrees: Able to turn 360 degrees safely in 4 seconds or less Standing Unsupported, Alternately Place Feet on Step/Stool: Able to stand independently and safely and complete 8 steps in 20 seconds Standing Unsupported, One Foot in Front: Loses balance while stepping or standing Standing on One Leg: Tries to lift leg/unable to hold 3 seconds but remains standing independently Total Score: 43 Timed Up and Go Test Normal TUG (seconds): 16.78 Static Sitting Balance Static Sitting - Balance Support: Feet supported Static Sitting - Level of Assistance: 7: Independent Dynamic Sitting Balance Dynamic Sitting - Balance Support: Feet supported Dynamic Sitting - Level of Assistance: 7: Independent Static Standing Balance Static Standing - Balance Support: No upper extremity supported Static Standing - Level of Assistance: 7: Independent Dynamic Standing Balance Dynamic Standing - Balance Support: Bilateral upper extremity supported Dynamic Standing - Level of Assistance: 6: Modified independent (Device/Increase time) Dynamic Standing - Balance Activities: Lateral lean/weight shifting;Ball toss;Reaching across  midline Extremity/Trunk Assessment RUE Assessment RUE Assessment: Within Functional Limits LUE Assessment LUE Assessment: Within Functional Limits   Maleta Pacha 01/12/2023, 11:38 AM

## 2023-01-13 ENCOUNTER — Other Ambulatory Visit (HOSPITAL_COMMUNITY): Payer: Self-pay

## 2023-01-13 LAB — GLUCOSE, CAPILLARY
Glucose-Capillary: 129 mg/dL — ABNORMAL HIGH (ref 70–99)
Glucose-Capillary: 110 mg/dL — ABNORMAL HIGH (ref 70–99)

## 2023-01-13 NOTE — Progress Notes (Signed)
PROGRESS NOTE   Subjective/Complaints:  Pt doing well, reviewed d/c paperwork and only issue was that he stated he doesn't take losartan-- plans to talk to his cardiologist when he leaves here. No other questions or concerns. Slept ok, pain is as controlled as is usual for him, LBM was this morning and last night he had a large BM. Urinating fine. Denies any other complaints or concerns.   ROS: as per HPI. Denies CP, SOB, abd pain, N/V/D/C, or any other complaints at this time.    Objective:   No results found. No results for input(s): "WBC", "HGB", "HCT", "PLT" in the last 72 hours.  No results for input(s): "NA", "K", "CL", "CO2", "GLUCOSE", "BUN", "CREATININE", "CALCIUM" in the last 72 hours.   Intake/Output Summary (Last 24 hours) at 01/13/2023 0940 Last data filed at 01/13/2023 0725 Gross per 24 hour  Intake 480 ml  Output --  Net 480 ml        Physical Exam: Vital Signs Blood pressure (!) 127/106, pulse 60, temperature 97.7 F (36.5 C), resp. rate 18, height 5\' 8"  (1.727 m), weight 95.3 kg, SpO2 98%.   General: No acute distress, sitting at EOB Mood and affect are appropriate Heart: RRR this morning, +murmur Lungs: Clear to auscultation, breathing unlabored, no rales or wheezes Abdomen: Positive bowel sounds, soft nontender to palpation, nondistended Extremities: No clubbing, cyanosis, or edema Skin: No evidence of breakdown, no evidence of rash  PRIOR EXAMS: Visual fields intact  EOMI Neurologic: Cranial nerves II through XII intact, motor strength is 5/5 in left 4/5 right  deltoid, bicep, tricep, grip, 5/5 B hip flexor, knee extensors, ankle dorsiflexor and plantar flexor Sensory exam normal sensation to light touch and proprioception in bilateral upper and lower extremities Cerebellar exam dysmetria RIght  finger to nose to finger mild  Musculoskeletal: Functional active motion in all 4 extremities. No  joint swelling. Right thigh pain.   Assessment/Plan: 1. Functional deficits which require 3+ hours per day of interdisciplinary therapy in a comprehensive inpatient rehab setting. Physiatrist is providing close team supervision and 24 hour management of active medical problems listed below. Physiatrist and rehab team continue to assess barriers to discharge/monitor patient progress toward functional and medical goals  Care Tool:  Bathing    Body parts bathed by patient: Right arm, Left upper leg, Right lower leg, Left arm, Left lower leg, Chest, Abdomen, Front perineal area, Face, Buttocks, Right upper leg         Bathing assist Assist Level: Independent with assistive device     Upper Body Dressing/Undressing Upper body dressing   What is the patient wearing?: Pull over shirt    Upper body assist Assist Level: Independent    Lower Body Dressing/Undressing Lower body dressing      What is the patient wearing?: Underwear/pull up, Pants     Lower body assist Assist for lower body dressing: Independent with assitive device     Toileting Toileting Toileting Activity did not occur (Clothing management and hygiene only): N/A (no void or bm)  Toileting assist Assist for toileting: Independent with assistive device     Transfers Chair/bed transfer  Transfers assist  Chair/bed transfer assist level: Independent with assistive device     Locomotion Ambulation   Ambulation assist      Assist level: Independent with assistive device Assistive device: Walker-rolling Max distance: 500   Walk 10 feet activity   Assist     Assist level: Independent with assistive device Assistive device: Walker-rolling   Walk 50 feet activity   Assist    Assist level: Independent with assistive device Assistive device: Walker-rolling    Walk 150 feet activity   Assist    Assist level: Independent with assistive device Assistive device: Walker-rolling     Walk 10 feet on uneven surface  activity   Assist Walk 10 feet on uneven surfaces activity did not occur: Safety/medical concerns   Assist level: Independent with assistive device Assistive device: Walker-rolling   Wheelchair     Assist Is the patient using a wheelchair?: No Type of Wheelchair: Manual    Wheelchair assist level: Dependent - Patient 0% Max wheelchair distance: 251ft    Wheelchair 50 feet with 2 turns activity    Assist        Assist Level: Dependent - Patient 0%   Wheelchair 150 feet activity     Assist      Assist Level: Dependent - Patient 0%   Blood pressure (!) 127/106, pulse 60, temperature 97.7 F (36.5 C), resp. rate 18, height 5\' 8"  (1.727 m), weight 95.3 kg, SpO2 98%.  Medical Problem List and Plan: 1. 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 No driving until PMR re eval ~3wks post d/c  -D/C today 01/13/23  2.  Antithrombotics: -DVT/anticoagulation:  Pharmaceutical: Eliquis             -antiplatelet therapy: none   3. Pain Management: Tylenol as needed             -continue gabapentin 300 mg q HS              -heat or ice prn for left tight discomfort--pt seems to be tolerating activity 4. Mood/Behavior/Sleep: LCSW to evaluate and provide emotional support             -continue bupropion 200 mg q AM and 100 mg q HS for depression history             -antipsychotic agents: n/a   5. Neuropsych/cognition: This patient is capable of making decisions on his own behalf.   6. Skin/Wound Care: Routine skin care checks   7. Fluids/Electrolytes/Nutrition: Routine Is and Os and follow-up chemistries             -continue B12   I personally reviewed all of the patient's labs today, and lab work is within normal limits.  8: Hypertension: monitor TID and prn              -Toprol-XL 50 mg BID, Losartan 25mg  QD  -01/13/23 BP fairly well controlled overall, pt reports not taking losartan at home, and plans to speak with cardiology about this Vitals:   01/13/23 0642 01/13/23 0925  BP: (!) 144/91 (!) 127/106  Pulse: 65 60  Resp: 18   Temp:    SpO2: 98%       9: Hyperlipidemia: continue statin, Zetia  10: Glaucoma: continue Xalatan   11: Atrial fibrillation: on BB and Eliquis- pt states he does not have financial means for high Eliquis cost , will apply for assistance , hope to get 30d supply , back up plan is back on warfarin, will need cardiology f/u ~1-2 wks post d/c  -01/13/23 understands this, will get eliquis from [facility] pharmacy today.    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 01/13/2023, 9:40 AM

## 2023-01-13 NOTE — Plan of Care (Signed)
  Problem: Education: Goal: Knowledge of disease or condition will improve Outcome: Completed/Met Goal: Knowledge of secondary prevention will improve (MUST DOCUMENT ALL) Outcome: Completed/Met Goal: Knowledge of patient specific risk factors will improve (Mark N/A or DELETE if not current risk factor) Outcome: Completed/Met   Problem: Ischemic Stroke/TIA Tissue Perfusion: Goal: Complications of ischemic stroke/TIA will be minimized Outcome: Completed/Met   Problem: Coping: Goal: Will verbalize positive feelings about self Outcome: Completed/Met Goal: Will identify appropriate support needs Outcome: Completed/Met   Problem: Health Behavior/Discharge Planning: Goal: Ability to manage health-related needs will improve Outcome: Completed/Met Goal: Goals will be collaboratively established with patient/family Outcome: Completed/Met   Problem: Self-Care: Goal: Ability to participate in self-care as condition permits will improve Outcome: Completed/Met Goal: Verbalization of feelings and concerns over difficulty with self-care will improve Outcome: Completed/Met Goal: Ability to communicate needs accurately will improve Outcome: Completed/Met   Problem: Nutrition: Goal: Risk of aspiration will decrease Outcome: Completed/Met Goal: Dietary intake will improve Outcome: Completed/Met   

## 2023-01-13 NOTE — Plan of Care (Signed)
  Problem: RH Balance Goal: LTG Patient will maintain dynamic standing balance (PT) Description: LTG:  Patient will maintain dynamic standing balance with assistance during mobility activities (PT) Outcome: Completed/Met Flowsheets (Taken 01/06/2023 1731) LTG: Pt will maintain dynamic standing balance during mobility activities with:: Independent with assistive device    Problem: Sit to Stand Goal: LTG:  Patient will perform sit to stand with assistance level (PT) Description: LTG:  Patient will perform sit to stand with assistance level (PT) Outcome: Completed/Met Flowsheets (Taken 01/06/2023 1731) LTG: PT will perform sit to stand in preparation for functional mobility with assistance level: Independent with assistive device   Problem: RH Bed Mobility Goal: LTG Patient will perform bed mobility with assist (PT) Description: LTG: Patient will perform bed mobility with assistance, with/without cues (PT). Outcome: Completed/Met Flowsheets (Taken 01/06/2023 1731) LTG: Pt will perform bed mobility with assistance level of: Independent with assistive device    Problem: RH Bed to Chair Transfers Goal: LTG Patient will perform bed/chair transfers w/assist (PT) Description: LTG: Patient will perform bed to chair transfers with assistance (PT). Outcome: Completed/Met Flowsheets (Taken 01/06/2023 1731) LTG: Pt will perform Bed to Chair Transfers with assistance level: Independent with assistive device    Problem: RH Car Transfers Goal: LTG Patient will perform car transfers with assist (PT) Description: LTG: Patient will perform car transfers with assistance (PT). Outcome: Completed/Met Flowsheets (Taken 01/06/2023 1731) LTG: Pt will perform car transfers with assist:: Supervision/Verbal cueing   Problem: RH Furniture Transfers Goal: LTG Patient will perform furniture transfers w/assist (OT/PT) Description: LTG: Patient will perform furniture transfers  with assistance (OT/PT). Outcome:  Completed/Met Flowsheets (Taken 01/06/2023 1731) LTG: Pt will perform furniture transfers with assist:: Independent with assistive device    Problem: RH Ambulation Goal: LTG Patient will ambulate in controlled environment (PT) Description: LTG: Patient will ambulate in a controlled environment, # of feet with assistance (PT). Outcome: Completed/Met Flowsheets (Taken 01/06/2023 1731) LTG: Pt will ambulate in controlled environ  assist needed:: Independent with assistive device LTG: Ambulation distance in controlled environment: up to 200 ft using LRAD with improved coordination Goal: LTG Patient will ambulate in home environment (PT) Description: LTG: Patient will ambulate in home environment, # of feet with assistance (PT). Outcome: Completed/Met Flowsheets (Taken 01/06/2023 1731) LTG: Pt will ambulate in home environ  assist needed:: Independent with assistive device LTG: Ambulation distance in home environment: up to 50 ft using LRAD   Problem: RH Stairs Goal: LTG Patient will ambulate up and down stairs w/assist (PT) Description: LTG: Patient will ambulate up and down # of stairs with assistance (PT) Outcome: Completed/Met Flowsheets (Taken 01/06/2023 1731) LTG: Pt will ambulate up/down stairs assist needed:: Independent with assistive device LTG: Pt will  ambulate up and down number of stairs: at least 4 steps with HR setup as per home environment in order to enter/ exit home

## 2023-01-15 ENCOUNTER — Telehealth: Payer: Self-pay

## 2023-01-15 ENCOUNTER — Encounter: Payer: Self-pay | Admitting: Cardiovascular Disease

## 2023-01-15 NOTE — Transitions of Care (Post Inpatient/ED Visit) (Signed)
01/15/2023  Name: Rodney Friedman MRN: [id number] DOB: Nov 02, 1945  Today's TOC FU Call Status: Today's TOC FU Call Status:: Successful TOC FU Call Completed TOC FU Call Complete Date: 01/15/23 Patient's Name and Date of Birth confirmed.  Transition Care Management Follow-up Telephone Call Date of Discharge: 01/13/23 Discharge Facility: (S) [facility]) ([place] [facility]) Type of Discharge: Inpatient Admission Primary Inpatient Discharge Diagnosis:: Thalamic infarction How have you been since you were released from the hospital?: Better Any questions or concerns?: No  Items Reviewed: Did you receive and understand the discharge instructions provided?: Yes Medications obtained,verified, and reconciled?: Yes (Medications Reviewed) Any new allergies since your discharge?: No Dietary orders reviewed?: Yes Do you have support at home?: Yes  Medications Reviewed Today: Medications Reviewed Today     Reviewed by Merleen Nicely, LPN (Licensed Practical Nurse) on 01/15/23 at 1135  Med List Status: <None>   Medication Order Taking? Sig Documenting Provider Last Dose Status Informant  acetaminophen (TYLENOL) 325 MG tablet [id number] Yes Take 1-2 tablets (325-650 mg total) by mouth every 4 (four) hours as needed for mild pain. Setzer, Lynnell Jude, PA-C Taking Active   amoxicillin (AMOXIL) 500 MG tablet [id number] No Take 4 tablets (2000 mg) by mouth ONE HOUR before any dental procedures.  Patient not taking: Reported on 01/03/2023   Allena Katz Not Taking Active Self           Med Note [facility] Alinda Dooms A   Wed Jan 03, 2023  2:25 PM) Patient states that he gets it refilled for dental procedures.   apixaban (ELIQUIS) 5 MG TABS tablet [id number] Yes Take 1 tablet (5 mg total) by mouth 2 (two) times daily. Setzer, Lynnell Jude, PA-C Taking Active   buPROPion [facility] SR) 200 MG 12 hr tablet [id number] Yes Take 1 tablet (200 mg total) by mouth every  morning. Archer Asa, MD Taking Active Self  buPROPion [facility] SR) 100 MG 12 hr tablet [id number] Yes 1 qhs Plovsky, Earvin Hansen, MD Taking Active Self  docusate sodium (COLACE) 100 MG capsule [id number] Yes Take 1 capsule (100 mg total) by mouth 2 (two) times daily. Setzer, Lynnell Jude, PA-C Taking Active   ezetimibe (ZETIA) 10 MG tablet [id number] Yes Take 1 tablet (10 mg total) by mouth daily. Ivonne [name], NP Taking Active Self  gabapentin (NEURONTIN) 300 MG capsule [id number] Yes Take 1 capsule (300 mg total) by mouth at bedtime. Setzer, Lynnell Jude, PA-C Taking Active   latanoprost (XALATAN) 0.005 % ophthalmic solution [id number] Yes Place 1 drop into the left eye every other day. [provider] Taking Active Self           Med Note Kandis Cocking Alinda Dooms A   Wed Jan 03, 2023  2:36 PM) Patient states that he needs a refill.  losartan (COZAAR) 25 MG tablet [id number] No Take 1 tablet (25 mg total) by mouth daily.  Patient not taking: Reported on 01/15/2023   Almon Hercules, MD Not Taking Active   metoprolol succinate (TOPROL-XL) 50 MG 24 hr tablet [id number] Yes Take 1 tablet (50 mg total) by mouth 2 (two) times daily. Take with or immediately following a meal. Ivonne [name], NP Taking Active Self  rosuvastatin (CRESTOR) 20 MG tablet [id number] Yes Take 1 tablet (20 mg total) by mouth daily. Ivonne [name], NP Taking Active Self            Home Care and Equipment/Supplies: Were Home Health Services Ordered?: Yes  Name of Home Health Agency:: [facility] Has Agency set up a time to come to your home?: No Any new equipment or medical supplies ordered?: Yes Name of Medical supply agency?: hospital Were you able to get the equipment/medical supplies?: Yes Do you have any questions related to the use of the equipment/supplies?: No  Functional Questionnaire: Do you need assistance with bathing/showering or dressing?: No Do you need assistance with meal preparation?:  No Do you need assistance with eating?: No Do you have difficulty maintaining continence: No Do you need assistance with getting out of bed/getting out of a chair/moving?: No Do you have difficulty managing or taking your medications?: No  Follow up appointments reviewed: PCP Follow-up appointment confirmed?: Yes Date of PCP follow-up appointment?: 01/25/23 Follow-up Provider: Angus Seller NP Do you need transportation to your follow-up appointment?: No Do you understand care options if your condition(s) worsen?: Yes-patient verbalized understanding    SIGNATURE  Woodfin Ganja LPN [facility] Nurse Health Advisor Direct Dial [phone]

## 2023-01-15 NOTE — Telephone Encounter (Signed)
Call to patient as he has multiple questions.  Advised appt for Thursday this week.  He will make arrangements for transportation and do his best to be here

## 2023-01-16 ENCOUNTER — Ambulatory Visit (HOSPITAL_COMMUNITY): Payer: 59 | Admitting: Licensed Clinical Social Worker

## 2023-01-16 NOTE — Progress Notes (Unsigned)
Cardiology Office Note    Date:  01/18/2023  ID:  Terral, Glaves February 25, 1946, MRN [id number] PCP:  Ivonne [name], NP  Cardiologist:  Thurmon Fair, MD  Electrophysiologist:  None   Chief Complaint: Hospital follow-up, CVA, Eliquis continuation   History of Present Illness: .    Taelyn Aman is a 77 y.o. 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 Now with recent admission 12/2022 for CVA.  Patient was hospitalized on 01/02/2023 for due to acute stroke.  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  He was not given tenecteplase due to Coumadin use and outside tPA window.  They transitioned his warfarin to Eliquis.  Prior to this event, INRs had been within therapeutic range  He was given a free 30-day supply however with his insurance would be about $353 per month.  Was previously on warfarin specifically due to cost issues.  He was then transferred to rehab inpatient therapies and had done well.  There were no reports of any cardiac issues throughout admission and rehab.  Today he is here to discuss continuation of Eliquis.  Patient has brought in his patient care assistance forms.  He has been compliant with his Eliquis and has not missed any days.  Denies any issues with bleeding or bruising.  He did report thinking that he was newly  prescribed losartan 25 mg however he has been prescribed this chronically for some time but had not been taking.  Otherwise he has done very well.  Ambulates decently well with a golf club (we will try to get him a cane).  His weakness and aphasia has gotten significantly better.  Continues to improve each day.  From a cardiac standpoint he has no complaints.  Chronically, he does have some degree of shortness of breath that is difficult to discern whether due to underlying pulmonary disease versus cardiac.  He does not take any diuretics.  Denies any chest pain, palpitations, racing heart rates, orthopnea, syncope.   Labwork independently reviewed:   ROS: .    Please see the history of present illness.  All other systems are reviewed and otherwise negative.  Studies Reviewed: Marland Kitchen    EKG: No EKG today.  Reviewed EKG from recent admission 01/02/2023.  Atrial fibrillation, heart rate 72.  No significant changes from prior studies.  CV Studies: Cardiac studies reviewed are outlined and summarized above. Otherwise please see EMR for full report.  Echocardiogram 01/02/2023  1. Left ventricular ejection fraction, by estimation, is 55 to 60%. The  left ventricle has normal function. The left ventricle demonstrates  regional wall motion abnormalities (see scoring diagram/findings for  description). There is severe asymmetric  left ventricular hypertrophy of the basal-septal segment. Left ventricular  diastolic parameters are indeterminate.   2. Right ventricular systolic function is normal. The right ventricular  size is normal.   3. Left atrial size was severely dilated.  4. Right atrial size was severely dilated.   5. The mitral valve is degenerative. Mild to moderate mitral valve  regurgitation. Mild mitral stenosis. The mean mitral valve gradient is 6.0  mmHg with average heart rate of 70 bpm. Severe mitral annular  calcification.   6. The aortic valve has been repaired/replaced. Aortic  valve  regurgitation is not visualized. Echo findings are consistent with normal  structure and function of the aortic valve prosthesis. Aortic valve mean  gradient measures 13.0 mmHg.   7. The inferior vena cava is normal in size with greater than 50%  respiratory variability, suggesting right atrial pressure of 3 mmHg.   8. Evidence of atrial level shunting detected by color flow Doppler.  Agitated saline contrast bubble study was negative, with no evidence of  any interatrial shunt.    Current Reported Medications:.    Current Meds  Medication Sig   acetaminophen (TYLENOL) 325 MG tablet Take 1-2 tablets (325-650 mg total) by mouth every 4 (four) hours as needed for mild pain.   amoxicillin (AMOXIL) 500 MG tablet Take 4 tablets (2000 mg) by mouth ONE HOUR before any dental procedures.   apixaban (ELIQUIS) 5 MG TABS tablet Take 1 tablet (5 mg total) by mouth 2 (two) times daily.   buPROPion (WELLBUTRIN SR) 200 MG 12 hr tablet Take 1 tablet (200 mg total) by mouth every morning.   buPROPion ER (WELLBUTRIN SR) 100 MG 12 hr tablet 1 qhs   docusate sodium (COLACE) 100 MG capsule Take 1 capsule (100 mg total) by mouth 2 (two) times daily.   ezetimibe (ZETIA) 10 MG tablet Take 1 tablet (10 mg total) by mouth daily.   gabapentin (NEURONTIN) 300 MG capsule Take 1 capsule (300 mg total) by mouth at bedtime.   latanoprost (XALATAN) 0.005 % ophthalmic solution Place 1 drop into the left eye every other day.   metFORMIN (GLUCOPHAGE) 500 MG tablet Take 500 mg by mouth 2 (two) times daily with a meal.   metoprolol succinate (TOPROL-XL) 50 MG 24 hr tablet Take 1 tablet (50 mg total) by mouth 2 (two) times daily. Take with or immediately following a meal.   rosuvastatin (CRESTOR) 20 MG tablet Take 1 tablet (20 mg total) by mouth daily.    Physical Exam:    VS:  BP 132/86 (BP Location: Left Arm, Patient Position: Sitting, Cuff Size: Normal)   Pulse 90   Ht 5\' 8"  (1.727 m)   Wt 205 lb 6.4 oz (93.2  kg)   SpO2 98%   BMI 31.23 kg/m    Wt Readings from Last 3 Encounters:  01/18/23 205 lb 6.4 oz (93.2 kg)  01/05/23 210 lb 1.6 oz (95.3 kg)  01/03/23 199 lb 4.7 oz (90.4 kg)    GEN: Well nourished, well developed in no acute distress NECK: No JVD; No carotid bruits CARDIAC: irregularly irregular  RESPIRATORY:  Clear to auscultation without rales, wheezing or rhonchi  ABDOMEN: Soft, non-tender, non-distended EXTREMITIES:  No edema; No acute deformity   Asessement and Plan:.    Recent CVA 01/02/2023 Surprisingly the patient had thalamic infarct while compliant with warfarin and within therapeutic ranges.  Thankfully was not a large vessel occlusion. Did not receive tPA due to outside window.  Went to inpatient rehab and has done very well and continues to improve functionally.  Continue aggressive secondary prevention.  Permanent atrial fibrillation Rate controlled.  Asymptomatic.  Has not had documentation of sinus rhythm since April 2021.  Has severely dilated LA  which makes maintenance of sinus rhythm with interventions less likely. Continue with Eliquis 5 mg twice daily and Toprol-XL 50 mg twice daily   Aortic stenosis status post TAVR (gradient 13) Mild MS (gradient 6) His most recent echocardiogram on 01/02/2023 shows normal functioning valve.   Has amoxicillin 2000 mg ordered perioperatively 1 hour before any dental procedures. Aortic valve mean gradient measures 13. 0 mmHg. Aortic valve peak gradient measures 24. 8 mmHg.  Mild to moderate MR Previously had severe MR with a pattern suggestive ischemia before the TAVR however this has improved after TAVR.  Now appears to be stable.  Recent echocardiogram still showing mild to moderate.  MV peak gradient 11, mean gradient 6.  CAD status post CABG Asymptomatic and stable.  Most recent catheterization was in 2022 that showed patent bypass grafts.  No aspirin with Eliquis.  Hyperlipidemia On rosuvastatin 20 mg and ezetimibe.  LDL  28 2 weeks ago.   Diabetes type 2 Stable and reasonably controlled given age.  A1c 6.8%.    Hypertension Well-controlled.  132/86 today.   Continue losartan 25 mg  COPD Stable, with good PFTs historically.  Remote smoking history however quit 2+ years ago.  Last CT for lung cancer screening was June 2024.  Mild carotid stenosis (1 to 39%)  Most recent Doppler in 2022 shows mild disease.  Overall stable and asymptomatic.  I do not feel the need to routinely check this given mild disease in no symptoms.  OSA intolerant of CPAP  Chronic CLL Followed by Dr. Bertis Ruddy.  Stable.  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 PSAs have been barely detectable, but increasing.  Has had radiation years ago.  Off Lupron.   Social work Will discuss with social work and pharmacy to provide patient with financial assistance with Eliquis cost.  Will also try to arrange a cane for patient since he is using a golf club to ambulate.    Disposition: F/u with Dr. Royann Shivers 05/07/2023.  Signed, Abagail Kitchens, PA-C

## 2023-01-18 ENCOUNTER — Ambulatory Visit: Payer: Medicare Other | Attending: General Practice | Admitting: General Practice

## 2023-01-18 ENCOUNTER — Encounter: Payer: Self-pay | Admitting: General Practice

## 2023-01-18 VITALS — BP 132/86 | HR 90 | Ht 68.0 in | Wt 205.4 lb

## 2023-01-18 DIAGNOSIS — I340 Nonrheumatic mitral (valve) insufficiency: Secondary | ICD-10-CM | POA: Insufficient documentation

## 2023-01-18 DIAGNOSIS — I25708 Atherosclerosis of coronary artery bypass graft(s), unspecified, with other forms of angina pectoris: Secondary | ICD-10-CM | POA: Insufficient documentation

## 2023-01-18 DIAGNOSIS — I639 Cerebral infarction, unspecified: Secondary | ICD-10-CM | POA: Insufficient documentation

## 2023-01-18 DIAGNOSIS — I350 Nonrheumatic aortic (valve) stenosis: Secondary | ICD-10-CM | POA: Diagnosis present

## 2023-01-18 DIAGNOSIS — I10 Essential (primary) hypertension: Secondary | ICD-10-CM | POA: Insufficient documentation

## 2023-01-18 DIAGNOSIS — E1169 Type 2 diabetes mellitus with other specified complication: Secondary | ICD-10-CM | POA: Insufficient documentation

## 2023-01-18 DIAGNOSIS — E785 Hyperlipidemia, unspecified: Secondary | ICD-10-CM | POA: Insufficient documentation

## 2023-01-18 DIAGNOSIS — I482 Chronic atrial fibrillation, unspecified: Secondary | ICD-10-CM | POA: Insufficient documentation

## 2023-01-18 DIAGNOSIS — E669 Obesity, unspecified: Secondary | ICD-10-CM | POA: Diagnosis present

## 2023-01-18 DIAGNOSIS — Z952 Presence of prosthetic heart valve: Secondary | ICD-10-CM | POA: Diagnosis not present

## 2023-01-18 MED ORDER — APIXABAN 5 MG PO TABS: 5.0000 mg | ORAL_TABLET | Freq: Two times a day (BID) | ORAL | Status: DC

## 2023-01-18 NOTE — Patient Instructions (Signed)
Medication Instructions:  The current medical regimen is effective;  continue present plan and medications as directed. Please refer to the Current Medication list given to you today.  *If you need a refill on your cardiac medications before your next appointment, please call your pharmacy*  Lab Work: NONE If you have labs (blood work) drawn today and your tests are completely normal, you will receive your results only by: MyChart Message (if you have MyChart) OR A paper copy in the mail If you have any lab test that is abnormal or we need to change your treatment, we will call you to review the results.  Testing/Procedures: NONE ORDERED  Follow-Up: At [facility], you and your health needs are our priority.  As part of our continuing mission to provide you with exceptional heart care, we have created designated Provider Care Teams.  These Care Teams include your primary Cardiologist (physician) and Advanced Practice Providers (APPs -  Physician Assistants and Nurse Practitioners) who all work together to provide you with the care you need, when you need it.  Your next appointment:   4 month(s)  Provider:   Thurmon Fair, MD  or Edd Fabian, FNP        Other Instructions

## 2023-01-18 NOTE — Progress Notes (Signed)
Heart and Vascular Care Navigation  01/18/2023  Corri Blauer [facility] [birth year] [id number]  Reason for Referral: Eliquis patient assistance Patient is participating in a Managed Medicaid Plan: No, traditional Medicare and supplement  Engaged with patient face to face for initial visit for Heart and Vascular Care Coordination.                                                                                                   Assessment:              LCSW spoke with pt at the end of appt at our [facility] clinic. Pt shares that his adult son lives locally with his grandchildren, and he has an adult daughter in [place]. Is a retired Programmer, systems, receives pension and Tree surgeon. Notes that his friends locally are his main supports and have emergency keys/information should he need anything in future.   Starting Eliquis, unsure if he will be eligible for patient assistance. We reviewed application and guidelines, discussed ways to meet OOP costs. We also discussed how pt is doing post hospitalization, currently using a golf club as a cane- will route to PCP office to see if we can actual DME ordered for him.    We discussed difficulties of managing all the daily needs and health concerns, especially when others have so much on their plate. Pt doing well overall, has been resourceful since discharge with making sure he has his bases covered, should he have another hospital stay. I am impressed with all that he has arranged, but shared with him if there is any other resources or questions he had that I am available for Hrt/Vas clinics and my colleague Fredonia Highland, is available at his PCP office.  No additional questions at this time.                      HRT/VAS Care Coordination     Patients Home Cardiology Office [facility]   Outpatient Care Team Social Worker   Social Worker Name: Octavio Graves, [place], [phone]   Living arrangements for the past 2 months Single Family Home   Lives  with: Self   Patient Current Insurance Coverage Traditional Medicare  and supplement   Patient Has Concern With Paying Medical Bills No   Does Patient Have Prescription Coverage? Yes   Home Assistive Devices/Equipment Cane (specify quad or straight)       Social History:                                                                             SDOH Screenings   Food Insecurity: No Food Insecurity (01/18/2023)  Housing: Low Risk  (01/18/2023)  Transportation Needs: No Transportation Needs (01/18/2023)  Utilities: Not At Risk (01/18/2023)  Alcohol Screen: Low Risk  (05/18/2022)  Depression (PHQ2-9): Low Risk  (11/27/2022)  Financial Resource Strain: Low Risk  (01/18/2023)  Physical Activity: Insufficiently Active (05/18/2022)  Social Connections: Moderately Integrated (05/18/2022)  Stress: Stress Concern Present (05/18/2022)  Tobacco Use: Medium Risk (01/18/2023)  Health Literacy: Adequate Health Literacy (01/18/2023)    SDOH Interventions: Financial Resources:  Financial Strain Interventions:  (provided Eliquis Patient Assistance forms-otherwise doing well)  Food Insecurity:  Food Insecurity Interventions: Intervention Not Indicated  Housing Insecurity:  Housing Interventions: Intervention Not Indicated  Transportation:   Transportation Interventions: Intervention Not Indicated    Other Care Navigation Interventions:     Provided Pharmacy assistance resources  Provided Eliquis Patient Assistance forms for [employer]   Follow-up plan:   Provided pt my card, assisted with completing [employer] application and gave to LPN to submit to program, emailed pt a copy of this as well. Encouraged him to let me or the LCSW at his PCP office know if we could assist in any additional ways. I will check on application and with pt next week.

## 2023-01-19 ENCOUNTER — Telehealth: Payer: Self-pay | Admitting: Licensed Clinical Social Worker

## 2023-01-19 NOTE — Telephone Encounter (Signed)
H&V Care Navigation CSW Progress Note  Clinical Social Worker  received the following email from pt at 8:50pm o 01/18/23  to inquire about pharmacy. Note the same message was sent to PCP office via MyChart.  "ALERT: see [facility] reprt they have me listed to get meds at [facility]. INCORRECT. Considered out of network by Medicare will not pay. [facility] [address] only as past 9 years. [facility] only appears that [facility] is assuming all treatment is by their doctors, appts, and meds. Was assured by Soc Wkrs in hospital no change or assumption of meds to [facility]. Please Coritoru and pcp to manage my recovery, release from all [facility] staff and continue with present providers only. Please advise. Reg email OK. "  I responded via email with the following "Good Morning, Your medications may have shown [facility] if they filled some of your medications at discharge (such as your first free 30 days of Eliquis). [facility] am [facility] as your preferred [facility] on your chart.   Just as a side note- since I am a Child psychotherapist, I am not able to adjust your pharmacy or move scripts. Best thing to do when concerns like this arise is to call our nursing triage line at [phone] or utilize mychart and they'll have a provider, nurse, medical assistant, or pharmacist get you sorted! Especially as my email isn't monitored after hours or on weekends, I don't want to miss anything important.   There is always an afterhours provider at that number also."  Hope you have a good weekend! We will let you know of any updates with the Eliquis."  Patient is participating in a Managed Medicaid Plan:  No, traditional Medicare and supplement  SDOH Screenings   Food Insecurity: No Food Insecurity (01/18/2023)  Housing: Low Risk  (01/18/2023)  Transportation Needs: No Transportation Needs (01/18/2023)  Utilities: Not At Risk (01/18/2023)  Alcohol Screen: Low Risk   (05/18/2022)  Depression (PHQ2-9): Low Risk  (11/27/2022)  Financial Resource Strain: Low Risk  (01/18/2023)  Physical Activity: Insufficiently Active (05/18/2022)  Social Connections: Moderately Integrated (05/18/2022)  Stress: Stress Concern Present (05/18/2022)  Tobacco Use: Medium Risk (01/18/2023)  Health Literacy: Adequate Health Literacy (01/18/2023)    Octavio Graves, MSW, LCSW Clinical Social Worker II [facility] Heart/Vascular Care Navigation  [phone]- work cell phone (preferred) [phone]- desk phone

## 2023-01-23 ENCOUNTER — Telehealth: Payer: Self-pay | Admitting: Licensed Clinical Social Worker

## 2023-01-23 DIAGNOSIS — I482 Chronic atrial fibrillation, unspecified: Secondary | ICD-10-CM

## 2023-01-23 NOTE — Telephone Encounter (Signed)
H&V Care Navigation CSW Progress Note  Clinical Social Worker  called [name]  to f/u on application. Per representative the provider portion is not complete, missing information on the top regarding pt etc. Will route to [place], LPN and Verdon Cummins to review and re-submit.  Patient is participating in a Managed Medicaid Plan:  No, Medicare A&B and supplement  SDOH Screenings   Food Insecurity: No Food Insecurity (01/18/2023)  Housing: Low Risk  (01/18/2023)  Transportation Needs: No Transportation Needs (01/18/2023)  Utilities: Not At Risk (01/18/2023)  Alcohol Screen: Low Risk  (05/18/2022)  Depression (PHQ2-9): Low Risk  (11/27/2022)  Financial Resource Strain: Low Risk  (01/18/2023)  Physical Activity: Insufficiently Active (05/18/2022)  Social Connections: Moderately Integrated (05/18/2022)  Stress: Stress Concern Present (05/18/2022)  Tobacco Use: Medium Risk (01/18/2023)  Health Literacy: Adequate Health Literacy (01/18/2023)   Rodney Friedman, MSW, LCSW Clinical Social Worker II [facility] Heart/Vascular Care Navigation  [phone]- work cell phone (preferred) [phone]- desk phone

## 2023-01-24 ENCOUNTER — Ambulatory Visit: Payer: Medicare Other

## 2023-01-24 ENCOUNTER — Encounter: Payer: Self-pay | Admitting: Cardiovascular Disease

## 2023-01-24 NOTE — Telephone Encounter (Signed)
H&V Care Navigation CSW Progress Note  Clinical Social Worker  received the following email from pt after clinic hours . "Attached [facility] out-of-pocket update, up approx $23  from  the form  I gave you. Been reading, says medicare with part D(which I have)  pays for eliquis  leaving $44 per month co-pay for patient after $545 deductable I think it is and probably already met . Also have Part D drug plan too  with my [employer]. "   I responded back this morning: "Hi [name], If they request additional out of pocket I will provide this to the nursing team to submit for you- generally the nursing and pharmacy teams manage medication assistance. It may be best to send these through MyChart moving forward as mentioned in case I am off/out of the office unexpectedly."  Rodney Friedman will follow for eliquis determination pending provider portion completion.   Patient is participating in a Managed Medicaid Plan:  No, traditional Medicare and supplement  SDOH Screenings   Food Insecurity: No Food Insecurity (01/18/2023)  Housing: Low Risk  (01/18/2023)  Transportation Needs: No Transportation Needs (01/18/2023)  Utilities: Not At Risk (01/18/2023)  Alcohol Screen: Low Risk  (05/18/2022)  Depression (PHQ2-9): Low Risk  (11/27/2022)  Financial Resource Strain: Low Risk  (01/18/2023)  Physical Activity: Insufficiently Active (05/18/2022)  Social Connections: Moderately Integrated (05/18/2022)  Stress: Stress Concern Present (05/18/2022)  Tobacco Use: Medium Risk (01/18/2023)  Health Literacy: Adequate Health Literacy (01/18/2023)     Rodney Friedman, Rodney Friedman, Rodney Friedman Clinical Social Worker II [facility] Heart/Vascular Care Navigation  [phone]- work cell phone (preferred) [phone]- desk phone

## 2023-01-25 ENCOUNTER — Encounter: Payer: Self-pay | Admitting: Nurse Practitioner

## 2023-01-25 ENCOUNTER — Other Ambulatory Visit: Payer: Self-pay | Admitting: Cardiovascular Disease

## 2023-01-25 ENCOUNTER — Ambulatory Visit (INDEPENDENT_AMBULATORY_CARE_PROVIDER_SITE_OTHER): Payer: Medicare Other | Admitting: Nurse Practitioner

## 2023-01-25 VITALS — BP 146/79 | HR 80 | Temp 97.0°F | Wt 208.0 lb

## 2023-01-25 DIAGNOSIS — I25709 Atherosclerosis of coronary artery bypass graft(s), unspecified, with unspecified angina pectoris: Secondary | ICD-10-CM

## 2023-01-25 DIAGNOSIS — I6381 Other cerebral infarction due to occlusion or stenosis of small artery: Secondary | ICD-10-CM | POA: Diagnosis not present

## 2023-01-25 DIAGNOSIS — Z23 Encounter for immunization: Secondary | ICD-10-CM | POA: Diagnosis not present

## 2023-01-25 LAB — HM DIABETES EYE EXAM

## 2023-01-25 NOTE — Patient Instructions (Signed)
1. Need for influenza vaccination  - Flu Vaccine QUAD High Dose(Fluad)  2. Thalamic infarction (HCC)  - CBC - Basic Metabolic Panel  Follow up:  Follow up in 3 months

## 2023-01-25 NOTE — Assessment & Plan Note (Signed)
-   Flu Vaccine QUAD High Dose(Fluad)  2. Thalamic infarction (HCC)  - CBC - Basic Metabolic Panel  Follow up:  Follow up in 3 months

## 2023-01-25 NOTE — Progress Notes (Signed)
@Patient  ID: Rodney Friedman, male    DOB: 1946/03/06, 77 y.o.   MRN: [id number]  Chief Complaint  Patient presents with   Hospitalization Follow-up    Referring provider: Ivonne [name], NP  Recent significant events:  Hospital admission: 01/02/23  Hospital Course:   Acute ischemic stroke: Presented with right-sided weakness.  MRI showed acute lacunar infarct of left thalamus and left occipital cortex.  Patient was outside tPA window on arrival to ED.  Reportedly struggling with his INR on warfarin.  INR was 2.4 on admission but 1.7 on 8/9).  CT angio with 60% stenosis in left carotid bifurcation and 40% stenosis in right carotid bifurcation.  TTE with LVEF of 55 to 60% and severe LVH.  LDL 28.  A1c 6.8%.  Symptoms improved significantly. -Neurology recommended Eliquis and signed off.   -Continue home Crestor and Zetia. -Outpatient follow-up with neurology in 4 to 6 weeks after discharge from CIR -Continue therapy at CIR   Controlled NIDDM-2: A1c 6.8%.  CBG within acceptable range. -Continue low-dose metformin   Hypertension: BP elevated. -Continue Toprol-XL. -Added low-dose losartan.   Chronic atrial fibrillation: Rate controlled -Continue home Toprol-XL. -Changed warfarin to Eliquis due to labile INR and stroke   Bilateral carotid artery stenosis: CT angio head and neck as above. -Continue Crestor and Zetia as above -Continue Eliquis   History of CAD: No cardiopulmonary symptoms -Continue home meds   History of severe AS status post TAVR: Stable -Outpatient follow-up   CLL/history of prostate cancer: Follows urology and Dr. Bertis Ruddy -On yearly surveillance. -Outpatient follow-up     OSA not on CPAP -Monitor for desaturation.   Chronic COPD: Stable.  Quit smoking about 2 years ago. -Continue bronchodilators   Vitamin D deficiency: Seems she is taking high-dose vitamin D biweekly.  Vitamin D level 81.  Previously 113. -Discontinue  multivitamin -Recheck in 6 months to 1 year   Elisha Montel was admitted to rehab 01/05/2023 for inpatient therapies to consist of PT, ST and OT at least three hours five days a week. Past admission physiatrist, therapy team and rehab RN have worked together to provide customized collaborative inpatient rehab.  Left anterior thigh pain treated with ice and/or heat as needed.  Follow-up labs 8/19 with improvement in leukocytosis to 11.9.  Other parameters within normal limits. Heat or ice prn for left tight discomfort--pt seems to be tolerating activity.    Blood pressures were monitored on TID basis and Toprol-XL 50 mg twice daily continued   Diabetes has been monitored with ac/hs CBG checks and SSI was use prn for tighter BS control. Essentially no coverage required.    Rehab course: During patient's stay in rehab weekly team conferences were held to monitor patient's progress, set goals and discuss barriers to discharge. At admission, patient required min assist-supervision with basic self-care skills and IADL, contact guard assist/min assist with mobility.   HPI  Patient presents today for hospital follow-up.  Please see notes above.  Overall patient has been doing well since he was discharged home from the hospital.  He did complete physical therapy while in the hospital.  Rodney Friedman come out to the house spine has been cleared.  He is compliant with medications.  Patient feels like he is at his baseline compared to before going into the hospital.  Patient does live at home alone.  He is able to perform activities of daily living independently.  He does already have upcoming appointment scheduled with cardiology, neurology, urology.  denies f/c/s, n/v/d, hemoptysis, PND, leg swelling Denies chest pain or edema        Allergies  Allergen Reactions   Other Rash    Allergen: "Plants and bushes while doing yard work"   [employer] Nausea And Vomiting   Poison Ivy Extract Rash     Immunization History  Administered Date(s) Administered   Fluad Quad(high Dose 65+) 01/22/2019, 01/25/2023   Influenza Split 02/17/2015   Influenza, High Dose Seasonal PF 02/15/2018, 01/22/2020, 01/23/2022   Influenza, Quadrivalent, Recombinant, Inj, Pf 01/22/2019   Influenza,inj,Quad PF,6+ Mos 02/11/2016, 02/08/2017   Influenza-Unspecified 02/17/2015, 01/22/2019, 01/23/2022   Moderna Sars-Covid-2 Vaccination 08/20/2019, 09/17/2019, 03/25/2020   Pneumococcal Conjugate-13 02/24/2014   Pneumococcal Polysaccharide-23 09/08/2015   Tdap 09/08/2015   Unspecified SARS-COV-2 Vaccination 08/20/2019   Zoster Recombinant(Shingrix) 02/23/2022, 04/26/2022   Zoster, Live 05/22/2013    Past Medical History:  Diagnosis Date   Adenomatous colon polyp    Alcoholism (HCC)    Anxiety    Atrial fibrillation (HCC)    CAD (coronary artery disease)    Cataract    removed left eye    CHF (congestive heart failure) (HCC) 12/16/2020   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 Clifton James & Dr. 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    Tobacco History: Social History   Tobacco Use  Smoking  Status Former   Current packs/day: 0.00   Average packs/day: 0.8 packs/day for 59.0 years (44.3 ttl pk-yrs)   Types: Cigarettes   Start date: 12/16/1961   Quit date: 12/16/2020   Years since quitting: 2.1   Passive exposure: Past  Smokeless Tobacco Never   Counseling given: Not Answered   Outpatient Encounter Medications as of 01/25/2023  Medication Sig   acetaminophen (TYLENOL) 325 MG tablet Take 1-2 tablets (325-650 mg total) by mouth every 4 (four) hours as needed for mild pain.   amoxicillin (AMOXIL) 500 MG tablet Take 4 tablets (2000 mg) by mouth ONE HOUR before any dental procedures.   apixaban (ELIQUIS) 5 MG TABS tablet Take 1 tablet (5 mg total) by mouth 2 (two) times daily. Lot=[id number] ex=04-2024 #28-2 weeks   buPROPion (WELLBUTRIN SR) 200 MG 12 hr tablet Take 1 tablet (200 mg total) by mouth every morning.   buPROPion ER (WELLBUTRIN SR) 100 MG 12 hr tablet 1 qhs   docusate sodium (COLACE) 100 MG capsule Take 1 capsule (100 mg total) by mouth 2 (two) times daily.   ezetimibe (ZETIA) 10 MG tablet Take 1 tablet (10 mg total) by  mouth daily.   gabapentin (NEURONTIN) 300 MG capsule Take 1 capsule (300 mg total) by mouth at bedtime.   latanoprost (XALATAN) 0.005 % ophthalmic solution Place 1 drop into the left eye every other day.   metFORMIN (GLUCOPHAGE) 500 MG tablet Take 500 mg by mouth 2 (two) times daily with a meal.   metoprolol succinate (TOPROL-XL) 50 MG 24 hr tablet Take 1 tablet (50 mg total) by mouth 2 (two) times daily. Take with or immediately following a meal.   rosuvastatin (CRESTOR) 20 MG tablet Take 1 tablet (20 mg total) by mouth daily.   losartan (COZAAR) 25 MG tablet Take 1 tablet (25 mg total) by mouth daily. (Patient not taking: Reported on 01/25/2023)   No facility-administered encounter medications on file as of 01/25/2023.     Review of Systems  Review of Systems  Constitutional: Negative.   HENT: Negative.    Cardiovascular: Negative.   Gastrointestinal:  Negative.   Allergic/Immunologic: Negative.   Neurological: Negative.   Psychiatric/Behavioral: Negative.         Physical Exam  BP (!) 146/79   Pulse 80   Temp (!) 97 F (36.1 C)   Wt 208 lb (94.3 kg)   SpO2 100%   BMI 31.63 kg/m   Wt Readings from Last 5 Encounters:  01/25/23 208 lb (94.3 kg)  01/18/23 205 lb 6.4 oz (93.2 kg)  01/05/23 210 lb 1.6 oz (95.3 kg)  01/03/23 199 lb 4.7 oz (90.4 kg)  11/27/22 213 lb 9.6 oz (96.9 kg)     Physical Exam Vitals and nursing note reviewed.  Constitutional:      General: He is not in acute distress.    Appearance: He is well-developed.  Cardiovascular:     Rate and Rhythm: Normal rate and regular rhythm.  Pulmonary:     Effort: Pulmonary effort is normal.     Breath sounds: Normal breath sounds.  Skin:    General: Skin is warm and dry.  Neurological:     Mental Status: He is alert and oriented to person, place, and time.      Lab Results:  CBC    Component Value Date/Time   WBC 11.9 (H) 01/08/2023 0530   RBC 4.71 01/08/2023 0530   HGB 14.8 01/08/2023 0530   HGB 15.5 11/27/2022 0839   HGB 15.8 01/01/2017 0752   HCT 43.3 01/08/2023 0530   HCT 47.4 11/27/2022 0839   HCT 46.4 01/01/2017 0752   PLT 174 01/08/2023 0530   PLT 177 11/27/2022 0839   MCV 91.9 01/08/2023 0530   MCV 94 11/27/2022 0839   MCV 91.8 01/01/2017 0752   MCH 31.4 01/08/2023 0530   MCHC 34.2 01/08/2023 0530   RDW 13.6 01/08/2023 0530   RDW 13.3 11/27/2022 0839   RDW 13.5 01/01/2017 0752   LYMPHSABS 4.7 (H) 01/02/2023 0115   LYMPHSABS 9.5 (H) 04/30/2019 1103   LYMPHSABS 9.7 (H) 01/01/2017 0752   MONOABS 1.4 (H) 01/02/2023 0115   MONOABS 1.5 (H) 01/01/2017 0752   EOSABS 0.7 (H) 01/02/2023 0115   EOSABS 0.2 04/30/2019 1103   BASOSABS 0.3 (H) 01/02/2023 0115   BASOSABS 0.1 04/30/2019 1103   BASOSABS 0.1 01/01/2017 0752    BMET    Component Value Date/Time   NA 139 01/08/2023 0530   NA 143 11/27/2022 0839   NA 141 01/04/2015 0808   K  4.1 01/08/2023 0530   K 4.3 01/04/2015 0808   CL 106 01/08/2023 0530   CO2 22 01/08/2023  0530   CO2 21 (L) 01/04/2015 0808   GLUCOSE 115 (H) 01/08/2023 0530   GLUCOSE 115 01/04/2015 0808   BUN 22 01/08/2023 0530   BUN 15 11/27/2022 0839   BUN 18.7 01/04/2015 0808   CREATININE 1.20 01/08/2023 0530   CREATININE 1.16 03/10/2016 0830   CREATININE 0.9 01/04/2015 0808   CALCIUM 9.0 01/08/2023 0530   CALCIUM 9.0 01/04/2015 0808   GFRNONAA >60 01/08/2023 0530   GFRNONAA 70 02/11/2016 1526   GFRAA 79 04/26/2020 0941   GFRAA 81 02/11/2016 1526    BNP    Component Value Date/Time   BNP 672.5 (H) 01/24/2021 0409    ProBNP No results found for: "PROBNP"  Imaging: ECHOCARDIOGRAM COMPLETE BUBBLE STUDY  Result Date: 01/02/2023    ECHOCARDIOGRAM REPORT   Patient Name:   Rodney Friedman Date of Exam: 01/02/2023 Medical Rec #:  [id number]       Height:       68.0 in Accession #:    [id number]      Weight:       210.3 lb Date of Birth:  August 30, 1945       BSA:          2.088 m Patient Age:    77 years        BP:           151/77 mmHg Patient Gender: M               HR:           85 bpm. Exam Location:  Inpatient Procedure: 2D Echo, Color Doppler, Cardiac Doppler, Intracardiac Opacification            Agent and Saline Contrast Bubble Study Indications:    Stroke  History:        Patient has prior history of Echocardiogram examinations, most                 recent 01/27/2022. CAD, Stroke, Aortic Valve Disease and s/p TAVR,                 Arrythmias:Atrial Fibrillation; Risk Factors:Diabetes,                 Hypertension, Dyslipidemia and Sleep Apnea.  Sonographer:    Milbert Coulter Referring Phys: [id number] ALLISON L ELLIS IMPRESSIONS  1. Left ventricular ejection fraction, by estimation, is 55 to 60%. The left ventricle has normal function. The left ventricle demonstrates regional wall motion abnormalities (see scoring diagram/findings for description). There is severe asymmetric left ventricular hypertrophy of the  basal-septal segment. Left ventricular diastolic parameters are indeterminate.  2. Right ventricular systolic function is normal. The right ventricular size is normal.  3. Left atrial size was severely dilated.  4. Right atrial size was severely dilated.  5. The mitral valve is degenerative. Mild to moderate mitral valve regurgitation. Mild mitral stenosis. The mean mitral valve gradient is 6.0 mmHg with average heart rate of 70 bpm. Severe mitral annular calcification.  6. The aortic valve has been repaired/replaced. Aortic valve regurgitation is not visualized. Echo findings are consistent with normal structure and function of the aortic valve prosthesis. Aortic valve mean gradient measures 13.0 mmHg.  7. The inferior vena cava is normal in size with greater than 50% respiratory variability, suggesting right atrial pressure of 3 mmHg.  8. Evidence of atrial level shunting detected by color flow Doppler. Agitated saline contrast bubble study was negative, with no evidence of any interatrial shunt. FINDINGS  Left  Ventricle: Left ventricular ejection fraction, by estimation, is 55 to 60%. The left ventricle has normal function. The left ventricle demonstrates regional wall motion abnormalities. Definity contrast agent was given IV to delineate the left ventricular endocardial borders. The left ventricular internal cavity size was normal in size. There is severe asymmetric left ventricular hypertrophy of the basal-septal segment. Left ventricular diastolic parameters are indeterminate.  LV Wall Scoring: The inferior wall is hypokinetic. Right Ventricle: The right ventricular size is normal. No increase in right ventricular wall thickness. Right ventricular systolic function is normal. Left Atrium: Left atrial size was severely dilated. Right Atrium: Right atrial size was severely dilated. Pericardium: There is no evidence of pericardial effusion. Mitral Valve: The mitral valve is degenerative in appearance. Severe  mitral annular calcification. Mild to moderate mitral valve regurgitation. Mild mitral valve stenosis. MV peak gradient, 11.1 mmHg. The mean mitral valve gradient is 6.0 mmHg with average heart rate of 70 bpm. Tricuspid Valve: The tricuspid valve is normal in structure. Tricuspid valve regurgitation is not demonstrated. No evidence of tricuspid stenosis. Aortic Valve: The aortic valve has been repaired/replaced. Aortic valve regurgitation is not visualized. Aortic valve mean gradient measures 13.0 mmHg. Aortic valve peak gradient measures 24.8 mmHg. Echo findings are consistent with normal structure and function of the aortic valve prosthesis. Pulmonic Valve: The pulmonic valve was not well visualized. Pulmonic valve regurgitation is not visualized. Aorta: The aortic root and ascending aorta are structurally normal, with no evidence of dilitation. Venous: The inferior vena cava is normal in size with greater than 50% respiratory variability, suggesting right atrial pressure of 3 mmHg. IAS/Shunts: Evidence of atrial level shunting detected by color flow Doppler. Agitated saline contrast was given intravenously to evaluate for intracardiac shunting. Agitated saline contrast bubble study was negative, with no evidence of any interatrial shunt.  LEFT VENTRICLE PLAX 2D LVIDd:         3.90 cm LVIDs:         2.90 cm LV PW:         1.50 cm LV IVS:        1.40 cm  RIGHT VENTRICLE RV S prime:     14.40 cm/s TAPSE (M-mode): 1.7 cm LEFT ATRIUM            Index        RIGHT ATRIUM           Index LA diam:      6.20 cm  2.97 cm/m   RA Area:     28.00 cm LA Vol (A4C): 132.0 ml 63.22 ml/m  RA Volume:   87.90 ml  42.10 ml/m  AORTIC VALVE AV Vmax:           249.00 cm/s AV Vmean:          165.000 cm/s AV VTI:            0.469 m AV Peak Grad:      24.8 mmHg AV Mean Grad:      13.0 mmHg LVOT Vmax:         108.00 cm/s LVOT Vmean:        68.700 cm/s LVOT VTI:          0.197 m LVOT/AV VTI ratio: 0.42  AORTA Ao Asc diam: 3.80 cm MITRAL  VALVE MV Area (PHT): 2.66 cm     SHUNTS MV Peak grad:  11.1 mmHg    Systemic VTI: 0.20 m MV Mean grad:  6.0 mmHg MV Vmax:  1.66 m/s MV Vmean:      114.3 cm/s MV Decel Time: 285 msec MV E velocity: 170.00 cm/s MV A velocity: 79.70 cm/s MV E/A ratio:  2.13 Riley Lam MD Electronically signed by Riley Lam MD Signature Date/Time: 01/02/2023/12:32:59 PM    Final    MR BRAIN WO CONTRAST  Result Date: 01/02/2023 CLINICAL DATA:  Slurred speech with right-sided weakness and facial droop EXAM: MRI HEAD WITHOUT CONTRAST TECHNIQUE: Multiplanar, multiecho pulse sequences of the brain and surrounding structures were obtained without intravenous contrast. COMPARISON:  Head CT and CTA from earlier today FINDINGS: Brain: Restricted diffusion in the left thalamus. Punctate restricted diffusion at the left occipital cortex. Mild chronic small vessel ischemia in the cerebral white matter for age. No acute hemorrhage, hydrocephalus, mass, or collection. Few chronic microhemorrhages are seen in the left cerebral hemisphere with nonspecific pattern. Mild, generalized cerebral volume loss. Vascular: Major flow voids are preserved.  There was preceding CTA. Skull and upper cervical spine: No focal marrow lesion Sinuses/Orbits: Negative IMPRESSION: Acute lacunar infarct at the left thalamus. Tiny acute left occipital cortex infarct. Electronically Signed   By: Tiburcio Pea M.D.   On: 01/02/2023 05:45   DG Chest Portable 1 View  Result Date: 01/02/2023 CLINICAL DATA:  Right leg weakness, possible stroke EXAM: PORTABLE CHEST 1 VIEW COMPARISON:  11/03/2022 CT FINDINGS: Cardiac shadow is enlarged. Changes of prior TAVR are noted. Postsurgical changes are seen. The lungs are well aerated bilaterally. No focal infiltrate is seen. No bony abnormality is noted. IMPRESSION: No acute abnormality noted. Electronically Signed   By: Alcide Clever M.D.   On: 01/02/2023 02:49   CT ANGIO HEAD NECK W WO CM (CODE  STROKE)  Result Date: 01/02/2023 CLINICAL DATA:  Initial evaluation for neuro deficit, stroke. EXAM: CT ANGIOGRAPHY HEAD AND NECK WITH AND WITHOUT CONTRAST TECHNIQUE: Multidetector CT imaging of the head and neck was performed using the standard protocol during bolus administration of intravenous contrast. Multiplanar CT image reconstructions and MIPs were obtained to evaluate the vascular anatomy. Carotid stenosis measurements (when applicable) are obtained utilizing NASCET criteria, using the distal internal carotid diameter as the denominator. RADIATION DOSE REDUCTION: This exam was performed according to the departmental dose-optimization program which includes automated exposure control, adjustment of the mA and/or kV according to patient size and/or use of iterative reconstruction technique. CONTRAST:  75mL OMNIPAQUE IOHEXOL 350 MG/ML SOLN COMPARISON:  CT from earlier the same day. FINDINGS: CTA NECK FINDINGS Aortic arch: Visualized arch within normal limits for caliber. Moderate atheromatous change about the visualized arch. Origin of the great vessels incompletely visualized. No visible stenosis. Right carotid system: Right common and internal carotid arteries are patent without dissection. Moderate calcified plaque about the right carotid bulb without hemodynamically significant greater than 50% stenosis. Left carotid system: Left common and internal carotid arteries are patent without dissection. Moderate calcified plaque about the left carotid bulb with up to approximately 60% stenosis by NASCET criteria. Vertebral arteries: Both vertebral arteries arise from the subclavian arteries. No visible significant proximal subclavian artery stenosis. Vertebral arteries are patent without significant stenosis or dissection. Skeleton: No worrisome osseous lesions. Moderate to advanced spondylosis present at C5-6. Other neck: No other acute finding. Upper chest: No other acute finding. Review of the MIP images  confirms the above findings CTA HEAD FINDINGS Anterior circulation: Atheromatous change seen about the carotid siphons with associated up to moderate stenoses bilaterally. A1 segments patent bilaterally. Normal anterior communicating complex. Anterior cerebral arteries patent without stenosis.  No M1 stenosis or occlusion. No proximal ECA branch occlusion or high-grade stenosis. Distal MCA branches perfused and symmetric. Posterior circulation: Both V4 segments widely patent. Right vertebral artery slightly dominant. Both PICA patent. Basilar widely patent without stenosis. Superior cerebellar and posterior cerebral arteries patent bilaterally. Venous sinuses: Patent allowing for timing the contrast bolus. Anatomic variants: As above.  No aneurysm. Review of the MIP images confirms the above findings IMPRESSION: 1. Negative CTA for large vessel occlusion or other emergent finding. 2. Moderate calcified plaque about the carotid bifurcations bilaterally. Resultant 60% stenosis on the left and 40% stenosis on the right. 3. Atheromatous change about the carotid siphons with up to moderate stenoses bilaterally. Aortic Atherosclerosis (ICD10-I70.0). These results were communicated to Dr. Derry Lory at 1:30 am on 01/02/2023 by text page via the [facility] messaging system. Electronically Signed   By: Rise Mu M.D.   On: 01/02/2023 01:52   CT HEAD CODE STROKE WO CONTRAST  Result Date: 01/02/2023 CLINICAL DATA:  Code stroke. Initial evaluation for neuro deficit, stroke. EXAM: CT HEAD WITHOUT CONTRAST TECHNIQUE: Contiguous axial images were obtained from the base of the skull through the vertex without intravenous contrast. RADIATION DOSE REDUCTION: This exam was performed according to the departmental dose-optimization program which includes automated exposure control, adjustment of the mA and/or kV according to patient size and/or use of iterative reconstruction technique. COMPARISON:  None Available. FINDINGS:  Brain: Mild age-related cerebral atrophy. No acute intracranial hemorrhage. No acute large vessel territory infarct. No mass lesion, midline shift or mass effect. No hydrocephalus or extra-axial fluid collection. Vascular: No abnormal hyperdense vessel. Scattered calcified atherosclerosis of skull base. Skull: Scalp soft tissues and calvarium within normal limits. Sinuses/Orbits: Globes and orbital soft tissues within normal limits. Paranasal sinuses and mastoid air cells are clear. Other: None. ASPECTS [facility] Stroke Program Early CT Score) - Ganglionic level infarction (caudate, lentiform nuclei, internal capsule, insula, M1-M3 cortex): 7 - Supraganglionic infarction (M4-M6 cortex): 3 Total score (0-10 with 10 being normal): 10 IMPRESSION: 1. No acute intracranial abnormality. 2. ASPECTS is 10. These results were communicated to Dr. Derry Lory at 1:30 am on 01/02/2023 by text page via the [facility] messaging system. Electronically Signed   By: Rise Mu M.D.   On: 01/02/2023 01:30     Assessment & Plan:   Need for influenza vaccination - Flu Vaccine QUAD High Dose(Fluad)  2. Thalamic infarction (HCC)  - CBC - Basic Metabolic Panel  Follow up:  Follow up in 3 months     Ivonne [name], NP 01/25/2023

## 2023-01-25 NOTE — Telephone Encounter (Signed)
Pt will be in today or appt. KH

## 2023-01-26 ENCOUNTER — Other Ambulatory Visit: Payer: Self-pay | Admitting: Nurse Practitioner

## 2023-01-26 DIAGNOSIS — D72829 Elevated white blood cell count, unspecified: Secondary | ICD-10-CM

## 2023-01-26 LAB — BASIC METABOLIC PANEL
BUN/Creatinine Ratio: 17 (ref 10–24)
BUN: 19 mg/dL (ref 8–27)
CO2: 23 mmol/L (ref 20–29)
Calcium: 9.7 mg/dL (ref 8.6–10.2)
Chloride: 106 mmol/L (ref 96–106)
Creatinine, Ser: 1.15 mg/dL (ref 0.76–1.27)
Glucose: 147 mg/dL — ABNORMAL HIGH (ref 70–99)
Potassium: 4.4 mmol/L (ref 3.5–5.2)
Sodium: 142 mmol/L (ref 134–144)
eGFR: 66 mL/min/{1.73_m2} (ref >59)

## 2023-01-26 LAB — CBC
Hematocrit: 42.8 % (ref 37.5–51.0)
Hemoglobin: 14.6 g/dL (ref 13.0–17.7)
MCH: 32 pg (ref 26.6–33.0)
MCHC: 34.1 g/dL (ref 31.5–35.7)
MCV: 94 fL (ref 79–97)
Platelets: 199 10*3/uL (ref 150–450)
RBC: 4.56 x10E6/uL (ref 4.14–5.80)
RDW: 14.1 % (ref 11.6–15.4)
WBC: 15 10*3/uL — ABNORMAL HIGH (ref 3.4–10.8)

## 2023-01-26 NOTE — Telephone Encounter (Signed)
H&V Care Navigation CSW Progress Note  Clinical Social Worker contacted patient by phone to f/u about multiple messages he has been sending to team. Pt application for Eliquis has been received by [employer] including now completed provider portion. Will take 3-5 days to process. No answer, left voicemail at [phone]. Will re-attempt again before the weekend to f/u.  Patient is participating in a Managed Medicaid Plan:  No, Medicare and supplement only  SDOH Screenings   Food Insecurity: No Food Insecurity (01/18/2023)  Housing: Low Risk  (01/18/2023)  Transportation Needs: No Transportation Needs (01/18/2023)  Utilities: Not At Risk (01/18/2023)  Alcohol Screen: Low Risk  (05/18/2022)  Depression (PHQ2-9): Low Risk  (11/27/2022)  Financial Resource Strain: Low Risk  (01/18/2023)  Physical Activity: Insufficiently Active (05/18/2022)  Social Connections: Moderately Integrated (05/18/2022)  Stress: Stress Concern Present (05/18/2022)  Tobacco Use: Medium Risk (01/25/2023)  Health Literacy: Adequate Health Literacy (01/18/2023)    Rodney Friedman, MSW, LCSW Clinical Social Worker II [facility] Heart/Vascular Care Navigation  [phone]- work cell phone (preferred) [phone]- desk phone

## 2023-01-26 NOTE — Telephone Encounter (Signed)
Done and faxed

## 2023-01-26 NOTE — Telephone Encounter (Signed)
H&V Care Navigation CSW Progress Note  Clinical Social Worker  received a call back from pt in response to my message. He understands that the application has been fully sent and we will update him with any information. No additional information needed at this time. Shared with him sometimes there is challenge when multiple mychart messages are sent. Pt states understanding. No additional questions at this time, appreciative for calls.  Patient is participating in a Managed Medicaid Plan:  No, Medicare and supplement  SDOH Screenings   Food Insecurity: No Food Insecurity (01/18/2023)  Housing: Low Risk  (01/18/2023)  Transportation Needs: No Transportation Needs (01/18/2023)  Utilities: Not At Risk (01/18/2023)  Alcohol Screen: Low Risk  (05/18/2022)  Depression (PHQ2-9): Low Risk  (11/27/2022)  Financial Resource Strain: Low Risk  (01/18/2023)  Physical Activity: Insufficiently Active (05/18/2022)  Social Connections: Moderately Integrated (05/18/2022)  Stress: Stress Concern Present (05/18/2022)  Tobacco Use: Medium Risk (01/25/2023)  Health Literacy: Adequate Health Literacy (01/18/2023)    Octavio Graves, MSW, LCSW Clinical Social Worker II [facility] Health Heart/Vascular Care Navigation  [phone]- work cell phone (preferred) [phone]- desk phone

## 2023-01-30 ENCOUNTER — Encounter (HOSPITAL_COMMUNITY): Payer: Self-pay | Admitting: Licensed Clinical Social Worker

## 2023-01-30 ENCOUNTER — Ambulatory Visit (INDEPENDENT_AMBULATORY_CARE_PROVIDER_SITE_OTHER): Payer: Medicare Other | Admitting: Licensed Clinical Social Worker

## 2023-01-30 DIAGNOSIS — F339 Major depressive disorder, recurrent, unspecified: Secondary | ICD-10-CM | POA: Diagnosis not present

## 2023-01-30 NOTE — Progress Notes (Signed)
   THERAPIST PROGRESS NOTE  Session Time: 8:00am-8:55am  Participation Level: Active  Behavioral Response: NeatAlertIrritable  Type of Therapy: Individual Therapy  Treatment Goals addressed: "to cope with stress and manage depression and anxiety". Ekamveer will report improved stress management and coping skills, resulting in mood stability and less worrying 4 out of 7 days.       ProgressTowards Goals: Progressing  Interventions: CBT  Summary: Oden Hollenbach is a 77 y.o. male who presents with MDD, recurrent, chronic.   Suicidal/Homicidal: Nowith intent/plan  Therapist Response: Hakiem engaged well in individual session with Facilities manager. Clinician utilized CBT to process thoughts, feelings, and interactions surrounding his recent stroke and 10 days in rehab. Clinician discussed current functioning and the resulting anger, frustration, and fear that he is really "screwed" if he becomes unable to care for himself. Clinician discussed options for long term care if needed, staying at home with an aid, hiring son to be his PCA, etc. Clinician also explored thought processes about aging, getting sick, and eventually dying. Clinician identified the importance of maintaining multiple supports in the community. Clinician also reflected the ongoing disappointment that his son continues to make poor choices regarding the future. Clinician discussed coping skills, med management, and upcoming follow-up appts. Chanson shared that he does not want to have to follow up with heart and stroke docs, but he will likely go once and make a decision afterwards. Clinician encouraged Cornelio to continue his daily activities and to focus time on friendships.    Plan: Return again in 2 weeks.  Diagnosis: Major depression, recurrent, chronic (HCC)  Collaboration of Care: Psychiatrist AEB informed Dr. Donell Beers about Cayde's progress  Patient/Guardian was advised Release of Information must be obtained prior to any record  release in order to collaborate their care with an outside provider. Patient/Guardian was advised if they have not already done so to contact the registration department to sign all necessary forms in order for [place] to release information regarding their care.   Consent: Patient/Guardian gives verbal consent for treatment and assignment of benefits for services provided during this visit. Patient/Guardian expressed understanding and agreed to proceed.   Chryl Heck [place], LCSW 01/30/2023

## 2023-01-30 NOTE — Telephone Encounter (Signed)
H&V Care Navigation CSW Progress Note  Clinical Social Worker  contacted [employer] to f/u on application  for Eliquis.  Per representative, pt denial faxed over on the 6th, she will resend. Pt over income for household size. I will let him know and route this to provider teams.   Patient is participating in a Managed Medicaid Plan:  no, Medicare and supplement only  SDOH Screenings   Food Insecurity: No Food Insecurity (01/18/2023)  Housing: Low Risk  (01/18/2023)  Transportation Needs: No Transportation Needs (01/18/2023)  Utilities: Not At Risk (01/18/2023)  Alcohol Screen: Low Risk  (05/18/2022)  Depression (PHQ2-9): Low Risk  (11/27/2022)  Financial Resource Strain: Low Risk  (01/18/2023)  Physical Activity: Insufficiently Active (05/18/2022)  Social Connections: Moderately Integrated (05/18/2022)  Stress: Stress Concern Present (05/18/2022)  Tobacco Use: Medium Risk (01/25/2023)  Health Literacy: Adequate Health Literacy (01/18/2023)   Octavio Graves, MSW, LCSW Clinical Social Worker II [facility] Health Heart/Vascular Care Navigation  [phone]- work cell phone (preferred) [phone]- desk phone

## 2023-01-31 MED ORDER — APIXABAN 5 MG PO TABS: 5.0000 mg | ORAL_TABLET | Freq: Two times a day (BID) | ORAL | 5 refills | Status: DC

## 2023-01-31 NOTE — Telephone Encounter (Signed)
H&V Care Navigation CSW Progress Note  Clinical Social Worker  received call from pt  to f/u on [employer]. Shared pt may need to purchase one month of Eliquis to meet Part D deductible. Pt should speak with insurance company and pharmacy team at [facility] for further clarity. Pt ok with this purchase, provided clinic number for additional questions that may arise moving forward.   Patient is participating in a Managed Medicaid Plan:  No, traditional Medicare and supplement  SDOH Screenings   Food Insecurity: No Food Insecurity (01/18/2023)  Housing: Low Risk  (01/18/2023)  Transportation Needs: No Transportation Needs (01/18/2023)  Utilities: Not At Risk (01/18/2023)  Alcohol Screen: Low Risk  (05/18/2022)  Depression (PHQ2-9): Low Risk  (11/27/2022)  Financial Resource Strain: Low Risk  (01/18/2023)  Physical Activity: Insufficiently Active (05/18/2022)  Social Connections: Moderately Integrated (05/18/2022)  Stress: Stress Concern Present (05/18/2022)  Tobacco Use: Medium Risk (01/30/2023)  Health Literacy: Adequate Health Literacy (01/18/2023)    Octavio Graves, MSW, LCSW Clinical Social Worker II [facility] Health Heart/Vascular Care Navigation  [phone]- work cell phone (preferred) [phone]- desk phone

## 2023-01-31 NOTE — Telephone Encounter (Signed)
Prescription refill request for Eliquis received. Indication: Afib  Last office visit: 01/18/23 Molli Hazard)  Scr: 1.15 (01/25/23)  Age: 77 Weight: 94.3kg  Called and spoke with pt. Confirmed pt is taking Eliquis 5mg  BID. Refill sent.

## 2023-01-31 NOTE — Addendum Note (Signed)
Addended by: Betsy Coder B on: 01/31/2023 10:10 AM   Modules accepted: Orders

## 2023-02-09 ENCOUNTER — Ambulatory Visit: Payer: Medicare Other

## 2023-02-12 ENCOUNTER — Encounter: Payer: Self-pay | Admitting: Cardiovascular Disease

## 2023-02-12 DIAGNOSIS — I482 Chronic atrial fibrillation, unspecified: Secondary | ICD-10-CM

## 2023-02-12 MED ORDER — APIXABAN 5 MG PO TABS: 5.0000 mg | ORAL_TABLET | Freq: Two times a day (BID) | ORAL | 1 refills | Status: DC

## 2023-02-13 ENCOUNTER — Ambulatory Visit (INDEPENDENT_AMBULATORY_CARE_PROVIDER_SITE_OTHER): Payer: Medicare Other | Admitting: Licensed Clinical Social Worker

## 2023-02-13 ENCOUNTER — Encounter (HOSPITAL_COMMUNITY): Payer: Self-pay | Admitting: Licensed Clinical Social Worker

## 2023-02-13 DIAGNOSIS — F339 Major depressive disorder, recurrent, unspecified: Secondary | ICD-10-CM | POA: Diagnosis not present

## 2023-02-13 NOTE — Progress Notes (Signed)
   THERAPIST PROGRESS NOTE  Session Time: 8:00am-8:55am  Participation Level: Active  Behavioral Response: DisheveledAlertIrritable  Type of Therapy: Individual Therapy  Treatment Goals addressed: "to cope with stress and manage depression and anxiety". Tywayne will report improved stress management and coping skills, resulting in mood stability and less worrying 4 out of 7 days.       ProgressTowards Goals: Not Progressing  Interventions: Motivational Interviewing  Summary: Bodin Wigger is a 77 y.o. male who presents with MDD, recurrent, chronic.   Suicidal/Homicidal: Nowithout intent/plan  Therapist Response: Gualberto engaged well in individual session with clinician in office. Clinician explored thoughts, feelings, and interactions using MI OARS. Clinician reflected ongoing anxiety and stress over managing his household, his bills, medical insurance, doctor's visits, transportation, etc. Clinician validated frustration and anxiety about these daily challenges. Clinician also explored updates with health post-stroke. Emmit shared that his right arm continues to tremble and this made it difficult to put on a button up shirt this morning. He also shared some challenges with dexterity in fingers due to tremors.  Clinician provided time and space for Khaalis to share frustrations about changes in the world and the challenges he has to keep up with technology, being able to afford what he needs both for medications and to update his tech. Clinician discussed mindfulness and finding ways to create and attend to his own inner peace. Webster shared this is difficult, but he is working on it.   Plan: Return again in 4 weeks.  Diagnosis: Major depression, recurrent, chronic (HCC)  Collaboration of Care: Psychiatrist AEB provided update to Dr. Donell Beers  Patient/Guardian was advised Release of Information must be obtained prior to any record release in order to collaborate their care with an outside  provider. Patient/Guardian was advised if they have not already done so to contact the registration department to sign all necessary forms in order for [place] to release information regarding their care.   Consent: Patient/Guardian gives verbal consent for treatment and assignment of benefits for services provided during this visit. Patient/Guardian expressed understanding and agreed to proceed.   Chryl Heck [place], LCSW 02/13/2023

## 2023-02-19 ENCOUNTER — Other Ambulatory Visit: Payer: Self-pay

## 2023-02-19 NOTE — Telephone Encounter (Signed)
Taken care of . KH

## 2023-02-19 NOTE — Progress Notes (Signed)
Pt will come in for labs . KH

## 2023-02-22 ENCOUNTER — Encounter: Payer: Medicare Other | Admitting: Physical Medicine & Rehabilitation

## 2023-02-23 ENCOUNTER — Other Ambulatory Visit: Payer: Medicare Other

## 2023-02-23 DIAGNOSIS — D72829 Elevated white blood cell count, unspecified: Secondary | ICD-10-CM

## 2023-02-24 LAB — CBC
Hematocrit: 45.9 % (ref 37.5–51.0)
Hemoglobin: 15.1 g/dL (ref 13.0–17.7)
MCH: 31.7 pg (ref 26.6–33.0)
MCHC: 32.9 g/dL (ref 31.5–35.7)
MCV: 96 fL (ref 79–97)
Platelets: 193 10*3/uL (ref 150–450)
RBC: 4.77 x10E6/uL (ref 4.14–5.80)
RDW: 13.5 % (ref 11.6–15.4)
WBC: 11.7 10*3/uL — ABNORMAL HIGH (ref 3.4–10.8)

## 2023-02-26 ENCOUNTER — Other Ambulatory Visit: Payer: Self-pay | Admitting: Nurse Practitioner

## 2023-02-26 DIAGNOSIS — D72829 Elevated white blood cell count, unspecified: Secondary | ICD-10-CM

## 2023-03-01 ENCOUNTER — Ambulatory Visit: Payer: Self-pay | Admitting: Nurse Practitioner

## 2023-03-06 ENCOUNTER — Ambulatory Visit (HOSPITAL_COMMUNITY): Payer: 59 | Admitting: Licensed Clinical Social Worker

## 2023-03-14 ENCOUNTER — Ambulatory Visit (HOSPITAL_BASED_OUTPATIENT_CLINIC_OR_DEPARTMENT_OTHER): Payer: Medicare Other | Admitting: Psychiatry

## 2023-03-14 ENCOUNTER — Encounter (HOSPITAL_COMMUNITY): Payer: Self-pay | Admitting: Psychiatry

## 2023-03-14 VITALS — BP 126/82 | HR 76 | Resp 18 | Ht 68.0 in | Wt 205.6 lb

## 2023-03-14 DIAGNOSIS — F324 Major depressive disorder, single episode, in partial remission: Secondary | ICD-10-CM

## 2023-03-14 MED ORDER — GABAPENTIN 300 MG PO CAPS: 300.0000 mg | ORAL_CAPSULE | Freq: Every day | ORAL | Status: DC

## 2023-03-14 MED ORDER — BUPROPION HCL ER (SR) 100 MG PO TB12: ORAL_TABLET | ORAL | 4 refills | Status: DC

## 2023-03-14 MED ORDER — BUPROPION HCL ER (SR) 200 MG PO TB12: 200.0000 mg | ORAL_TABLET | Freq: Every morning | ORAL | 2 refills | Status: DC

## 2023-03-14 NOTE — Progress Notes (Signed)
Psychiatric Initial Adult Assessment   Patient Identification: Rodney Friedman MRN:  [id number] Date of Evaluation:  03/14/2023 Referral Source: Dr. Chilton Si Chief Complaint: Need follow-up care    Visit Diagnosis: Bipolar disorder, substance use disorder Bipolar disorder  History of Present Illness:   Today the patient is seen in the office.  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  Apparently was more related to his Coumadin.  His Coumadin blood levels or PTT was off.  He is now on Eliquis.  He has made about a 75 to 85% recovery.  His talking is a little bit stuttered but overall he can articulate himself well.  His strength seems to be pretty good.  He has had no falls.  He denies daily depression.  He is of course upset with the medical system and the fact that they changed all of his medicines and did not inform him why.  Further there are some issues with the billing and he believes he is being double bill.  Nonetheless he is very much an advocate for himself.  He denies daily depression.  He still does crossword puzzles.  He is very much into baseball.  Generally he is sleeping and eating well.  He is getting reasonably good energy.  He takes his Wellbutrin as prescribed.   Substance Abuse History in the last 12 months:  Yes.    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 Clifton James & Dr. 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    Past Surgical History:  Procedure Laterality Date   Arm Surgery Right    from door accident with glass   CARDIAC CATHETERIZATION     CATARACT EXTRACTION W/ INTRAOCULAR LENS IMPLANT Bilateral    COLONOSCOPY     CORONARY ARTERY BYPASS GRAFT  08/04/2004   EYE SURGERY     POLYPECTOMY     RIGHT/LEFT HEART CATH AND CORONARY/GRAFT ANGIOGRAPHY N/A 01/20/2021   Procedure: RIGHT/LEFT HEART CATH AND CORONARY/GRAFT ANGIOGRAPHY;  Surgeon: Kathleene Hazel, MD;  Location: [facility] INVASIVE CV LAB;  Service: Cardiovascular;  Laterality: N/A;  TEE WITHOUT CARDIOVERSION N/A 02/08/2021   Procedure: TRANSESOPHAGEAL ECHOCARDIOGRAM (TEE);  Surgeon: Kathleene Hazel, MD;  Location: [facility] INVASIVE CV LAB;  Service: Open Heart Surgery;  Laterality: N/A;   TRANSCATHETER AORTIC VALVE REPLACEMENT, CAROTID Left 02/08/2021   Procedure: TRANSCATHETER AORTIC VALVE REPLACEMENT, LEFT CAROTID;  Surgeon: Kathleene Hazel, MD;  Location: [facility] INVASIVE CV LAB;  Service: Open Heart Surgery;  Laterality: Left;    Family Psychiatric History:   Family History:  Family History  Problem Relation Age of Onset   Colon cancer Mother    Ovarian cancer Mother    Uterine cancer Mother    Hypertension Father    Prostate cancer Father    Colon polyps Neg Hx     Social History:   Social History   Socioeconomic History   Marital  status: Divorced    Spouse name: Not on file   Number of children: 2   Years of education: 16   Highest education level: Master's degree (e.g., MA, MS, MEng, MEd, MSW, MBA)  Occupational History   Occupation: Retired Runner, broadcasting/film/video  Tobacco Use   Smoking status: Former    Current packs/day: 0.00    Average packs/day: 0.8 packs/day for 59.0 years (44.3 ttl pk-yrs)    Types: Cigarettes    Start date: 12/16/1961    Quit date: 12/16/2020    Years since quitting: 2.2    Passive exposure: Past   Smokeless tobacco: Never  Vaping Use   Vaping status: Never Used  Substance and Sexual Activity   Alcohol use: No    Alcohol/week: 0.0 standard drinks of alcohol   Drug use: No   Sexual activity: Not Currently  Other Topics Concern   Not on file  Social History Narrative   Lives alone   Divorced   Right-handed   Education: College   No caffeine   Social Determinants of Health   Financial Resource Strain: Low Risk  (01/18/2023)   Overall Financial Resource Strain (CARDIA)    Difficulty of Paying Living Expenses: Not very hard  Food Insecurity: No Food Insecurity (02/05/2023)   Hunger Vital Sign    Worried About Running Out of Food in the Last Year: Never true    Ran Out of Food in the Last Year: Never true  Transportation Needs: Unmet Transportation Needs (02/05/2023)   PRAPARE - Transportation    Lack of Transportation (Medical): No    Lack of Transportation (Non-Medical): Yes  Physical Activity: Insufficiently Active (02/05/2023)   Exercise Vital Sign    Days of Exercise per Week: 4 days    Minutes of Exercise per Session: 30 min  Stress: Stress Concern Present (02/05/2023)   [employer] of Occupational Health - Occupational Stress Questionnaire    Feeling of Stress : To some extent  Social Connections: Moderately Integrated (02/05/2023)   Social Connection and Isolation Panel [NHANES]    Frequency of Communication with Friends and Family: More than three times a week    Frequency of  Social Gatherings with Friends and Family: Three times a week    Attends Religious Services: More than 4 times per year    Active Member of Clubs or Organizations: Yes    Attends Banker Meetings: More than 4 times per year    Marital Status: Divorced    Additional Social History:   Allergies:   Allergies  Allergen Reactions   Other Rash    Allergen: "Plants and bushes while doing yard work"   Cayenne Nausea And Vomiting  Poison Ivy Extract Rash    Metabolic Disorder Labs: Lab Results  Component Value Date   HGBA1C 6.8 (H) 01/03/2023   MPG 148.46 01/03/2023   MPG 128 12/17/2020   No results found for: "PROLACTIN" Lab Results  Component Value Date   CHOL 88 01/03/2023   TRIG 140 01/03/2023   HDL 32 (L) 01/03/2023   CHOLHDL 2.8 01/03/2023   VLDL 28 01/03/2023   LDLCALC 28 01/03/2023   LDLCALC 42 05/26/2022     Current Medications: Current Outpatient Medications  Medication Sig Dispense Refill   acetaminophen (TYLENOL) 325 MG tablet Take 1-2 tablets (325-650 mg total) by mouth every 4 (four) hours as needed for mild pain.     amoxicillin (AMOXIL) 500 MG tablet Take 4 tablets (2000 mg) by mouth ONE HOUR before any dental procedures. 12 tablet 4   apixaban (ELIQUIS) 5 MG TABS tablet Take 1 tablet (5 mg total) by mouth 2 (two) times daily. 180 tablet 1   buPROPion (WELLBUTRIN SR) 200 MG 12 hr tablet Take 1 tablet (200 mg total) by mouth every morning. 90 tablet 2   buPROPion ER (WELLBUTRIN SR) 100 MG 12 hr tablet 1 qhs 90 tablet 4   docusate sodium (COLACE) 100 MG capsule Take 1 capsule (100 mg total) by mouth 2 (two) times daily.     ezetimibe (ZETIA) 10 MG tablet Take 1 tablet (10 mg total) by mouth daily. 90 tablet 3   gabapentin (NEURONTIN) 300 MG capsule Take 1 capsule (300 mg total) by mouth at bedtime.     latanoprost (XALATAN) 0.005 % ophthalmic solution Place 1 drop into the left eye every other day.     metFORMIN (GLUCOPHAGE) 500 MG tablet Take 500  mg by mouth 2 (two) times daily with a meal.     metoprolol succinate (TOPROL-XL) 50 MG 24 hr tablet Take 1 tablet (50 mg total) by mouth 2 (two) times daily. Take with or immediately following a meal. 180 tablet 1   rosuvastatin (CRESTOR) 20 MG tablet Take 1 tablet (20 mg total) by mouth daily. 90 tablet 3   No current facility-administered medications for this visit.    Neurologic: Headache: No Seizure: No Paresthesias:No  Musculoskeletal: Strength & Muscle Tone: within normal limits Gait & Station: normal Patient leans: Right  Psychiatric Specialty Exam: ROS  Blood pressure 126/82, pulse 76, resp. rate 18, height 5\' 8"  (1.727 m), weight 205 lb 9.6 oz (93.3 kg).Body mass index is 31.26 kg/m.  General Appearance: Casual  Eye Contact:  Good  Speech:  Clear and Coherent  Volume:  Normal  Mood:  NA  Affect:  Appropriate  Thought Process:  Goal Directed  Orientation:  NA  Thought Content:  WDL  Suicidal Thoughts:  No  Homicidal Thoughts:  No  Memory:  Negative  Judgement:  Good  Insight:  Good  Psychomotor Activity:  Normal  Concentration:    Recall:  Fair  Fund of Knowledge:Good  Language: Good  Akathisia:  No  Handed:  Right  AIMS (if indicated):    Assets:  Desire for Improvement  ADL's:  Intact  Cognition: WNL  Sleep:      Treatment Plan Summary: 10/23/20242:22 PM     This patient i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  He continues taking Wellbutrin.  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  He continues in one-to-one therapy here.  Quite very financially stressed overall he is functioning quite  well.  He will be seen again in 3 months.

## 2023-03-20 ENCOUNTER — Ambulatory Visit (INDEPENDENT_AMBULATORY_CARE_PROVIDER_SITE_OTHER): Payer: Medicare Other | Admitting: Licensed Clinical Social Worker

## 2023-03-20 ENCOUNTER — Encounter (HOSPITAL_COMMUNITY): Payer: Self-pay | Admitting: Licensed Clinical Social Worker

## 2023-03-20 DIAGNOSIS — F339 Major depressive disorder, recurrent, unspecified: Secondary | ICD-10-CM | POA: Diagnosis not present

## 2023-03-20 NOTE — Progress Notes (Signed)
   THERAPIST PROGRESS NOTE  Session Time: 12:30pm-1:30pm  Participation Level: Active  Behavioral Response: Well GroomedAlertIrritable  Type of Therapy: Individual Therapy  Treatment Goals addressed:  "to cope with stress and manage depression and anxiety". [name] will report improved stress management and coping skills, resulting in mood stability and less worrying 4 out of 7 days.       ProgressTowards Goals: Not Progressing  Interventions: Solution Focused  Summary: Rodney Friedman is a 77 y.o. male who presents with MDD, recurrent, chronic.   Suicidal/Homicidal: Nowithout intent/plan  Therapist Response: Deyan engaged well in individual in person session with clinician. Clinician utilized solution-focused therapy to assist [name] with coping with stress, as well as solving some actual computer system challenges. Clinician assisted [name] with problem solving, noting that when small issues do not have to turn into big problems. Clinician processed health concerns, as well as positive updates. Clinician noted significant improvement in general health and healing post-stroke. [name] shared improvement in back pain and vision following the stroke. Clinician encouraged [name] to continue looking for positives, noting that the urge to look for the negative becomes habit and he will always be able to find it.   Plan: Return again in 2 weeks.  Diagnosis: Major depression, recurrent, chronic (HCC)  Collaboration of Care: Psychiatrist AEB updated Dr. Donell Beers  Patient/Guardian was advised Release of Information must be obtained prior to any record release in order to collaborate their care with an outside provider. Patient/Guardian was advised if they have not already done so to contact the registration department to sign all necessary forms in order for [place] to release information regarding their care.   Consent: Patient/Guardian gives verbal consent for treatment and assignment of benefits for  services provided during this visit. Patient/Guardian expressed understanding and agreed to proceed.   [name], LCSW 03/20/2023

## 2023-04-03 ENCOUNTER — Ambulatory Visit (INDEPENDENT_AMBULATORY_CARE_PROVIDER_SITE_OTHER): Payer: Medicare Other | Admitting: Licensed Clinical Social Worker

## 2023-04-03 ENCOUNTER — Encounter (HOSPITAL_COMMUNITY): Payer: Self-pay | Admitting: Licensed Clinical Social Worker

## 2023-04-03 ENCOUNTER — Telehealth (HOSPITAL_COMMUNITY): Payer: Self-pay | Admitting: Licensed Clinical Social Worker

## 2023-04-03 DIAGNOSIS — F339 Major depressive disorder, recurrent, unspecified: Secondary | ICD-10-CM | POA: Diagnosis not present

## 2023-04-03 NOTE — Telephone Encounter (Signed)
1:09pm 04/03/23 Patient called stating that he called the [facility] and was told that his policy is active, but the pt didn't get the name of rep. He spoke with the pt stated that $428.01 was deducted on 03/08/23 the [phone] was the same # I called.Marland KitchenMarguerite Olea

## 2023-04-03 NOTE — Progress Notes (Signed)
   THERAPIST PROGRESS NOTE  Session Time: 8:00am-8:55am  Participation Level: Active  Behavioral Response: Well GroomedAlertDepressed  Type of Therapy: Individual Therapy  Treatment Goals addressed: "to cope with stress and manage depression and anxiety". Bird will report improved stress management and coping skills, resulting in mood stability and less worrying 4 out of 7 days.     ProgressTowards Goals: Not Progressing  Interventions: CBT  Summary: Eraclio Lord is a 77 y.o. male who presents with MDD, recurrent, chronic.   Suicidal/Homicidal: Nowithout intent/plan  Therapist Response: Shimon engaged well in in-person session with clinician. Clinician utilized CBT to process updates in thoughts, feelings and behaviors. Clinician provided time and space for Juventino to share frustrations and fears about finances, health, and family interactions. Nikai shared many instances of his own care management and concerns that his health will continue to decline and he will be unable to care for himself. Clinician discussed ways to enlist support as well as coping skills. Clinician discussed changing NA/AA groups to get a different experience, participating in more socialization, or volunteering as a way to find goodness in his world, life, and experience.   Plan: Return again in 2 weeks.  Diagnosis: Major depression, recurrent, chronic (HCC)  Collaboration of Care: Psychiatrist AEB will provide updates to Dr. Donell Beers  Patient/Guardian was advised Release of Information must be obtained prior to any record release in order to collaborate their care with an outside provider. Patient/Guardian was advised if they have not already done so to contact the registration department to sign all necessary forms in order for [place] to release information regarding their care.   Consent: Patient/Guardian gives verbal consent for treatment and assignment of benefits for services provided during this visit.  Patient/Guardian expressed understanding and agreed to proceed.   Chryl Heck [place], LCSW 04/03/2023

## 2023-04-13 ENCOUNTER — Telehealth: Payer: Self-pay | Admitting: Nurse Practitioner

## 2023-04-13 NOTE — Telephone Encounter (Signed)
Copied from CRM [id number]. Topic: General - Other >> Apr 13, 2023  3:43 PM Dondra Prader A wrote: Reason for CRM: Kennyth Arnold from [facility] calling to f/u from a fax sent over for last office visit. Her call back number is [phone]

## 2023-04-16 NOTE — Telephone Encounter (Signed)
Lvm for [facility] to refax the form. KH

## 2023-04-17 ENCOUNTER — Encounter (HOSPITAL_COMMUNITY): Payer: Self-pay | Admitting: Licensed Clinical Social Worker

## 2023-04-17 ENCOUNTER — Other Ambulatory Visit: Payer: Self-pay | Admitting: Nurse Practitioner

## 2023-04-17 ENCOUNTER — Ambulatory Visit (INDEPENDENT_AMBULATORY_CARE_PROVIDER_SITE_OTHER): Payer: Medicare Other | Admitting: Licensed Clinical Social Worker

## 2023-04-17 DIAGNOSIS — F339 Major depressive disorder, recurrent, unspecified: Secondary | ICD-10-CM | POA: Diagnosis not present

## 2023-04-17 DIAGNOSIS — I10 Essential (primary) hypertension: Secondary | ICD-10-CM

## 2023-04-17 NOTE — Progress Notes (Signed)
   THERAPIST PROGRESS NOTE  Session Time: 8:00-8:55AM  Participation Level: Active  Behavioral Response: Well GroomedAlertIrritable  Type of Therapy: Individual Therapy  Treatment Goals addressed: "to cope with stress and manage depression and anxiety". Rodney Friedman will report improved stress management and coping skills, resulting in mood stability and less worrying 4 out of 7 days.     ProgressTowards Goals: Progressing  Interventions: CBT and Motivational Interviewing  Summary: Rodney Friedman is a 77 y.o. male who presents with MDD, recurrent, chronic.   Suicidal/Homicidal: Nowithout intent/plan  Therapist Response: Rodney Friedman engaged well in individual and person session with clinician.  Clinician utilized CBT and motivational interviewing to process thoughts feelings and interactions since last session.  Clinician processed daily struggles with responsibilities.  Clinician explored interactions with friends and family and discussed plans for Thanksgiving holiday.  Rodney Friedman shared continuing concerns about his health, but noted that he is on top of his responsibilities and attend all appointments.  Clinician discussed coping skills and ways to do the positive events and abilities in his life.  Rodney Friedman shared insight about his positive growth over the course of being in therapy.  Plan: Return again in 2 weeks.  Diagnosis: Major depression, recurrent, chronic (HCC)  Collaboration of Care: Patient refused AEB none required in this session.   Patient/Guardian was advised Release of Information must be obtained prior to any record release in order to collaborate their care with an outside provider. Patient/Guardian was advised if they have not already done so to contact the registration department to sign all necessary forms in order for [place] to release information regarding their care.   Consent: Patient/Guardian gives verbal consent for treatment and assignment of benefits for services provided  during this visit. Patient/Guardian expressed understanding and agreed to proceed.   Chryl Heck Inez, LCSW 04/17/2023

## 2023-04-18 ENCOUNTER — Other Ambulatory Visit (HOSPITAL_COMMUNITY): Payer: Self-pay | Admitting: Nurse Practitioner

## 2023-04-18 DIAGNOSIS — I10 Essential (primary) hypertension: Secondary | ICD-10-CM

## 2023-04-18 MED ORDER — METOPROLOL SUCCINATE ER 50 MG PO TB24: 50.0000 mg | ORAL_TABLET | Freq: Two times a day (BID) | ORAL | 0 refills | Status: DC

## 2023-04-25 ENCOUNTER — Encounter: Payer: Self-pay | Admitting: Pulmonary Disease

## 2023-04-25 ENCOUNTER — Ambulatory Visit: Payer: Medicare Other | Admitting: Pulmonary Disease

## 2023-04-25 VITALS — BP 140/64 | HR 79 | Temp 97.6°F | Ht 68.0 in | Wt 208.0 lb

## 2023-04-25 DIAGNOSIS — J432 Centrilobular emphysema: Secondary | ICD-10-CM | POA: Diagnosis not present

## 2023-04-25 NOTE — Progress Notes (Signed)
Synopsis: Referred in August 2022 for chest tightness  Subjective:   PATIENT ID: Rodney Friedman, Rodney Friedman  HPI  Chief Complaint  Patient presents with   Follow-up    Sob-same, had CVA in Aug. 2024   Rodney Friedman is a 77 year old male, former smoker with CLL, atrial fibrillation, diabetes mellitus type II, hypertension, coronary artery disease, aortic stenosis and sleep apnea who returns to pulmonary clinic for follow up of emphysema.   He was hospitalized for stroke in August. He has had a strong recovery. Breathing has overall been doing well.   OV 04/06/22 His breathing has been stable since last visit. He is using albuterol as needed.  LCS CT Chest 09/06/21 Lung-RADS 2.   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  His dyspnea has improved significantly and he reports he is able to do 25 minutes of continuous activity before he needs to take a break.Marland Kitchen  He was provided with Spiriva inhaler at last visit which he has been using intermittently for dyspnea and does find relief from this inhaler.  He is using it intermittently due to the cost of the inhaler.  He remains off cigarettes and reports he has quit 419 days now.  He continues to have frequent cravings.  He is not using any smoking cessation aids at this time.  Pulmonary function test today show an isolated moderate diffusion defect.  Review of his right heart cath on 01/20/2021 prior to the TAVR showed a mean pulmonary wedge pressure of 23 mmHg, mean PA pressure 34 mmHg.  OV 01/14/21 He was recently admitted to [facility] 7/29 to 8/1 for shortness of breath requiring bipap therapy. Initial concern was for pneumonia but antibiotics were discontinued and he was treated for heart failure exacerbation with improvement of his respiratory status.   Cardiology visit 8/22 with Dr. Rachelle Hora Croitoru reviewed, he is being followed  for aortic stenosis, atrial fibrillation, hypertension and coronary artery disease.  Given the progression of his aortic stenosis which is now deemed severe he is undergoing workup for TAVR.  He has history of CLL and is followed by Dr. Bertis Ruddy  He quit smoking 30 days ago and has a 25 pack year smoking history. He has quit on his own and has been doing ok with mainly having cravings in the morning. He mainly has exertional dyspnea which can lead to chest tightness. He has infrequent cough and wheezing. He denies sputum production. He has history of sleep apnea but reports no nighttime awakenings of dyspnea, cough or wheezing. He denies daytime sleepiness.   Family History  Problem Relation Age of Onset   Colon cancer Mother    Ovarian cancer Mother    Uterine cancer Mother    Hypertension Father    Prostate cancer Father    Colon polyps Neg Hx      Social History   Socioeconomic History   Marital status: Divorced    Spouse name: Not on file   Number of children: 2   Years of education: 107   Highest education level: Master's degree (e.g., MA, MS, MEng, MEd, MSW, MBA)  Occupational History   Occupation: Retired Runner, broadcasting/film/video  Tobacco Use   Smoking status: Former    Current packs/day: 0.00    Average packs/day: 0.8 packs/day for 59.0 years (44.3 ttl pk-yrs)    Types: Cigarettes    Start date: 12/16/1961    Quit  date: 12/16/2020    Years since quitting: 2.3    Passive exposure: Past   Smokeless tobacco: Never  Vaping Use   Vaping status: Never Used  Substance and Sexual Activity   Alcohol use: No    Alcohol/week: 0.0 standard drinks of alcohol   Drug use: No   Sexual activity: Not Currently  Other Topics Concern   Not on file  Social History Narrative   Lives alone   Divorced   Right-handed   Education: College   No caffeine   Social Determinants of Health   Financial Resource Strain: Medium Risk (04/22/2023)   Overall Financial Resource Strain (CARDIA)    Difficulty of  Paying Living Expenses: Somewhat hard  Food Insecurity: No Food Insecurity (04/22/2023)   Hunger Vital Sign    Worried About Running Out of Food in the Last Year: Never true    Ran Out of Food in the Last Year: Never true  Transportation Needs: No Transportation Needs (04/22/2023)   PRAPARE - Administrator, Civil Service (Medical): No    Lack of Transportation (Non-Medical): No  Recent Concern: Transportation Needs - Unmet Transportation Needs (02/05/2023)   PRAPARE - Transportation    Lack of Transportation (Medical): No    Lack of Transportation (Non-Medical): Yes  Physical Activity: Sufficiently Active (04/22/2023)   Exercise Vital Sign    Days of Exercise per Week: 5 days    Minutes of Exercise per Session: 30 min  Recent Concern: Physical Activity - Insufficiently Active (02/05/2023)   Exercise Vital Sign    Days of Exercise per Week: 4 days    Minutes of Exercise per Session: 30 min  Stress: Stress Concern Present (04/22/2023)   [employer] of Occupational Health - Occupational Stress Questionnaire    Feeling of Stress : Very much  Social Connections: Moderately Integrated (04/22/2023)   Social Connection and Isolation Panel [NHANES]    Frequency of Communication with Friends and Family: More than three times a week    Frequency of Social Gatherings with Friends and Family: More than three times a week    Attends Religious Services: More than 4 times per year    Active Member of [employer] or Organizations: Yes    Attends Engineer, structural: More than 4 times per year    Marital Status: Divorced  Intimate Partner Violence: Not At Risk (01/02/2023)   Humiliation, Afraid, Rape, and Kick questionnaire    Fear of Current or Ex-Partner: No    Emotionally Abused: No    Physically Abused: No    Sexually Abused: No     Allergies  Allergen Reactions   Other Rash    Allergen: "Plants and bushes while doing yard work"   [employer] Nausea And Vomiting   Poison Ivy  Extract Rash     Outpatient Medications Prior to Visit  Medication Sig Dispense Refill   acetaminophen (TYLENOL) 325 MG tablet Take 1-2 tablets (325-650 mg total) by mouth every 4 (four) hours as needed for mild pain.     amoxicillin (AMOXIL) 500 MG tablet Take 4 tablets (2000 mg) by mouth ONE HOUR before any dental procedures. 12 tablet 4   apixaban (ELIQUIS) 5 MG TABS tablet Take 1 tablet (5 mg total) by mouth 2 (two) times daily. 180 tablet 1   buPROPion (WELLBUTRIN SR) 200 MG 12 hr tablet Take 1 tablet (200 mg total) by mouth every morning. 90 tablet 2   buPROPion ER (WELLBUTRIN SR) 100 MG 12 hr tablet  1 qhs 90 tablet 4   docusate sodium (COLACE) 100 MG capsule Take 1 capsule (100 mg total) by mouth 2 (two) times daily.     ezetimibe (ZETIA) 10 MG tablet Take 1 tablet (10 mg total) by mouth daily. 90 tablet 3   gabapentin (NEURONTIN) 300 MG capsule Take 1 capsule (300 mg total) by mouth at bedtime.     latanoprost (XALATAN) 0.005 % ophthalmic solution Place 1 drop into the left eye every other day.     metFORMIN (GLUCOPHAGE) 500 MG tablet Take 500 mg by mouth 2 (two) times daily with a meal.     metoprolol succinate (TOPROL-XL) 50 MG 24 hr tablet Take 1 tablet (50 mg total) by mouth 2 (two) times daily. Take with or immediately following a meal. 180 tablet 0   rosuvastatin (CRESTOR) 20 MG tablet Take 1 tablet (20 mg total) by mouth daily. 90 tablet 3   No facility-administered medications prior to visit.    Review of Systems  Constitutional:  Negative for chills, fever, malaise/fatigue and weight loss.  HENT:  Negative for congestion, sinus pain and sore throat.   Respiratory:  Positive for shortness of breath. Negative for cough, hemoptysis, sputum production and wheezing.   Cardiovascular:  Negative for chest pain, palpitations, orthopnea, claudication and leg swelling.  Gastrointestinal:  Negative for abdominal pain, heartburn, nausea and vomiting.  Genitourinary: Negative.    Musculoskeletal:  Negative for joint pain and myalgias.  Skin:  Negative for rash.  Neurological:  Negative for weakness.    Objective:   Vitals:   04/25/23 1125  BP: (!) 140/64  Pulse: 79  Temp: 97.6 F (36.4 C)  TempSrc: Temporal  SpO2: 98%  Weight: 208 lb (94.3 kg)  Height: 5\' 8"  (1.727 m)    Physical Exam Constitutional:      General: He is not in acute distress.    Appearance: He is obese.  HENT:     Head: Normocephalic and atraumatic.  Eyes:     Conjunctiva/sclera: Conjunctivae normal.  Cardiovascular:     Rate and Rhythm: Normal rate. Rhythm irregular.     Pulses: Normal pulses.     Heart sounds: No murmur heard. Pulmonary:     Effort: Pulmonary effort is normal.     Breath sounds: No wheezing, rhonchi or rales.  Abdominal:     General: Bowel sounds are normal.     Palpations: Abdomen is soft.  Musculoskeletal:     Right lower leg: No edema.     Left lower leg: No edema.  Skin:    General: Skin is warm and dry.  Neurological:     General: No focal deficit present.     Mental Status: He is alert.     CBC    Component Value Date/Time   WBC 11.7 (H) 02/23/2023 0828   WBC 11.9 (H) 01/08/2023 0530   RBC 4.77 02/23/2023 0828   RBC 4.71 01/08/2023 0530   HGB 15.1 02/23/2023 0828   HGB 15.8 01/01/2017 0752   HCT 45.9 02/23/2023 0828   HCT 46.4 01/01/2017 0752   PLT 193 02/23/2023 0828   MCV 96 02/23/2023 0828   MCV 91.8 01/01/2017 0752   MCH 31.7 02/23/2023 0828   MCH 31.4 01/08/2023 0530   MCHC 32.9 02/23/2023 0828   MCHC 34.2 01/08/2023 0530   RDW 13.5 02/23/2023 0828   RDW 13.5 01/01/2017 0752   LYMPHSABS 4.7 (H) 01/02/2023 0115   LYMPHSABS 9.5 (H) 04/30/2019 1103   LYMPHSABS 9.7 (  H) 01/01/2017 0752   MONOABS 1.4 (H) 01/02/2023 0115   MONOABS 1.5 (H) 01/01/2017 0752   EOSABS 0.7 (H) 01/02/2023 0115   EOSABS 0.2 04/30/2019 1103   BASOSABS 0.3 (H) 01/02/2023 0115   BASOSABS 0.1 04/30/2019 1103   BASOSABS 0.1 01/01/2017 0752      Latest  Ref Rng & Units 01/25/2023    2:05 PM 01/08/2023    5:30 AM 01/05/2023    3:34 AM  BMP  Glucose 70 - 99 mg/dL 161  096  92   BUN 8 - 27 mg/dL 19  22  18    Creatinine 0.76 - 1.27 mg/dL 0.45  4.09  8.11   BUN/Creat Ratio 10 - 24 17     Sodium 134 - 144 mmol/L 142  139  139   Potassium 3.5 - 5.2 mmol/L 4.4  4.1  4.0   Chloride 96 - 106 mmol/L 106  106  109   CO2 20 - 29 mmol/L 23  22  19    Calcium 8.6 - 10.2 mg/dL 9.7  9.0  8.9    Chest imaging: LCS CT Chest 09/06/21 Cardiovascular: Aortic atherosclerosis. Tortuous thoracic aorta. Borderline cardiomegaly. Median sternotomy for CABG. Status post TAVR.   Mediastinum/Nodes: No mediastinal or definite hilar adenopathy, given limitations of unenhanced CT.   Lungs/Pleura: No pleural fluid. Mild centrilobular emphysema. Pulmonary nodules of maximally volume derived equivalent diameter 3.1 mm.  CTA Chest Aorta 12/30/20 No mediastinal or hilar adenopathy.  Esophagus is unremarkable.  Small bilateral pleural effusions lying dependently.  No pulmonary nodules or masses.  Saber-sheath trachea noted.  PFT:    Latest Ref Rng & Units 04/12/2021    8:34 AM  PFT Results  FVC-Pre L 3.47   FVC-Predicted Pre % 87   FVC-Post L 3.56   FVC-Predicted Post % 89   Pre FEV1/FVC % % 78   Post FEV1/FCV % % 80   FEV1-Pre L 2.71   FEV1-Predicted Pre % 93   FEV1-Post L 2.85   DLCO uncorrected ml/min/mmHg 14.91   DLCO UNC% % 62   DLCO corrected ml/min/mmHg 14.91   DLCO COR %Predicted % 62   DLVA Predicted % 70   TLC L 5.80   TLC % Predicted % 87   RV % Predicted % 97   Moderate diffusion defect.  Echo 12/17/2020: LVEF 60 to 65%.  LV has normal function.  LV diastolic parameters are indeterminate.  RV systolic function is normal.  RV size is normal.  Normal pulmonary artery pressure.  Left atrium is severely dilated.  Mean aortic gradient has increased from 25 mmHg to 33 mmHg.  Aortic valve is normal in structure.  There is moderate calcification of the  aortic valve.  Moderate thickening of the aortic valve.  Moderate aortic valve stenosis.  Heart Cath 01/20/21 RV systolic RV diastolic 3 mmHg  PA systolic pressure 52 mmHg PA diastolic pressure 20 mmHg PA mean 34 mmHg PW mean  Assessment & Plan:   Centrilobular emphysema (HCC)  Discussion: Rodney Friedman is a 77 year old male, former smoker with CLL, atrial fibrillation, diabetes mellitus type II, hypertension, coronary artery disease, aortic stenosis and sleep apnea who returns to pulmonary clinic for emphysema.   He can continue as needed albuterol. He will continue lung cancer screening CT chest scans.  He did not tolerate CPAP therapy in the past.  Follow up in 1 year, call sooner if needed.  Rodney Comas, MD [facility] Office:  [phone]   Current Outpatient Medications:    acetaminophen (TYLENOL) 325 MG tablet, Take 1-2 tablets (325-650 mg total) by mouth every 4 (four) hours as needed for mild pain., Disp: , Rfl:    amoxicillin (AMOXIL) 500 MG tablet, Take 4 tablets (2000 mg) by mouth ONE HOUR before any dental procedures., Disp: 12 tablet, Rfl: 4   apixaban (ELIQUIS) 5 MG TABS tablet, Take 1 tablet (5 mg total) by mouth 2 (two) times daily., Disp: 180 tablet, Rfl: 1   buPROPion (WELLBUTRIN SR) 200 MG 12 hr tablet, Take 1 tablet (200 mg total) by mouth every morning., Disp: 90 tablet, Rfl: 2   buPROPion ER (WELLBUTRIN SR) 100 MG 12 hr tablet, 1 qhs, Disp: 90 tablet, Rfl: 4   docusate sodium (COLACE) 100 MG capsule, Take 1 capsule (100 mg total) by mouth 2 (two) times daily., Disp: , Rfl:    ezetimibe (ZETIA) 10 MG tablet, Take 1 tablet (10 mg total) by mouth daily., Disp: 90 tablet, Rfl: 3   gabapentin (NEURONTIN) 300 MG capsule, Take 1 capsule (300 mg total) by mouth at bedtime., Disp: , Rfl:    latanoprost (XALATAN) 0.005 % ophthalmic solution, Place 1 drop into the left eye every other day., Disp: , Rfl:    metFORMIN  (GLUCOPHAGE) 500 MG tablet, Take 500 mg by mouth 2 (two) times daily with a meal., Disp: , Rfl:    metoprolol succinate (TOPROL-XL) 50 MG 24 hr tablet, Take 1 tablet (50 mg total) by mouth 2 (two) times daily. Take with or immediately following a meal., Disp: 180 tablet, Rfl: 0   rosuvastatin (CRESTOR) 20 MG tablet, Take 1 tablet (20 mg total) by mouth daily., Disp: 90 tablet, Rfl: 3

## 2023-04-25 NOTE — Patient Instructions (Signed)
Continue albuterol inhaler as needed  Follow up in 1 year, call sooner if needed

## 2023-04-26 ENCOUNTER — Ambulatory Visit (INDEPENDENT_AMBULATORY_CARE_PROVIDER_SITE_OTHER): Payer: Medicare Other | Admitting: Nurse Practitioner

## 2023-04-26 ENCOUNTER — Encounter: Payer: Self-pay | Admitting: Nurse Practitioner

## 2023-04-26 VITALS — BP 150/75 | HR 60 | Temp 98.2°F | Resp 14 | Ht 68.0 in | Wt 208.0 lb

## 2023-04-26 DIAGNOSIS — I10 Essential (primary) hypertension: Secondary | ICD-10-CM | POA: Diagnosis not present

## 2023-04-26 DIAGNOSIS — E1169 Type 2 diabetes mellitus with other specified complication: Secondary | ICD-10-CM

## 2023-04-26 LAB — POCT GLYCOSYLATED HEMOGLOBIN (HGB A1C): HbA1c, POC (controlled diabetic range): 6.5 % (ref 0.0–7.0)

## 2023-04-26 NOTE — Progress Notes (Signed)
Subjective   Patient ID: Rodney Friedman, male    DOB: [birth year], 77 y.o.   MRN: [id number]  Chief Complaint  Patient presents with   Follow-up    Referring provider: Ivonne [name], NP  Rodney Friedman is a 77 y.o. 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 Pt says it causes               abnormal EKGs No date: HLD (hyperlipidemia) No date: Hypertension No date: Leukemia [facility])     Comment:  CLL 2006: Myocardial infarction (HCC) No date: Osteopenia No date: Osteoporosis 07/08/2013: Prostate cancer (HCC)     Comment:  Otell[facility] at [facility]- getting Lupron shot every 6               months - traces in prostate and 2 lymphnodes left hip-               non focused traces per pt  02/08/2021: S/P TAVR (transcatheter aortic valve replacement)     Comment:  s/p TAVR with a 23mm Edwards S3U via the TF approach by               Dr. Clifton James & Dr. 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   Patient presents today for routine follow-up visit.  Overall he has been stable since his last visit here.  Patient is following with cardiology and Coumadin clinic.  He is  following with urology to monitor elevated PSA level.  He has followed with oncology.  Patient does have a history of smoking but no longer smokes. A1C in office today is 6.9.  Denies f/c/s, n/v/d, hemoptysis, PND, leg swelling. Denies chest pain or edema.      Allergies  Allergen Reactions   Other Rash    Allergen: "Plants and bushes while doing yard work"   [employer] Nausea And Vomiting   Poison Ivy Extract Rash    Immunization History  Administered Date(s) Administered   Fluad Quad(high Dose 65+) 01/22/2019, 01/25/2023   Influenza Split 02/17/2015   Influenza, High Dose Seasonal PF 02/15/2018, 01/22/2020, 01/23/2022   Influenza, Quadrivalent, Recombinant, Inj, Pf 01/22/2019   Influenza,inj,Quad PF,6+ Mos 02/11/2016, 02/08/2017   Influenza-Unspecified 02/17/2015, 01/22/2019, 01/23/2022   Moderna Sars-Covid-2 Vaccination 08/20/2019, 09/17/2019, 03/25/2020   Pneumococcal Conjugate-13 02/24/2014   Pneumococcal Polysaccharide-23 09/08/2015   Tdap 09/08/2015   Unspecified SARS-COV-2 Vaccination 08/20/2019   Zoster Recombinant(Shingrix) 02/23/2022, 04/26/2022   Zoster, Live 05/22/2013    Tobacco History: Social History   Tobacco Use  Smoking Status Former   Current packs/day: 0.00   Average packs/day: 0.8 packs/day for 59.0 years (44.3 ttl pk-yrs)   Types: Cigarettes   Start date: 12/16/1961   Quit date: 12/16/2020   Years  since quitting: 2.3   Passive exposure: Past  Smokeless Tobacco Never   Counseling given: Not Answered   Outpatient Encounter Medications as of 04/26/2023  Medication Sig   acetaminophen (TYLENOL) 325 MG tablet Take 1-2 tablets (325-650 mg total) by mouth every 4 (four) hours as needed for mild pain.   amoxicillin (AMOXIL) 500 MG tablet Take 4 tablets (2000 mg) by mouth ONE HOUR before any dental procedures.   apixaban (ELIQUIS) 5 MG TABS tablet Take 1 tablet (5 mg total) by mouth 2 (two) times daily.   buPROPion (WELLBUTRIN SR) 200 MG 12 hr tablet Take 1  tablet (200 mg total) by mouth every morning.   buPROPion ER (WELLBUTRIN SR) 100 MG 12 hr tablet 1 qhs   docusate sodium (COLACE) 100 MG capsule Take 1 capsule (100 mg total) by mouth 2 (two) times daily.   ezetimibe (ZETIA) 10 MG tablet Take 1 tablet (10 mg total) by mouth daily.   gabapentin (NEURONTIN) 300 MG capsule Take 1 capsule (300 mg total) by mouth at bedtime.   latanoprost (XALATAN) 0.005 % ophthalmic solution Place 1 drop into the left eye every other day.   metFORMIN (GLUCOPHAGE) 500 MG tablet Take 500 mg by mouth 2 (two) times daily with a meal.   metoprolol succinate (TOPROL-XL) 50 MG 24 hr tablet Take 1 tablet (50 mg total) by mouth 2 (two) times daily. Take with or immediately following a meal.   rosuvastatin (CRESTOR) 20 MG tablet Take 1 tablet (20 mg total) by mouth daily.   No facility-administered encounter medications on file as of 04/26/2023.    Review of Systems  Review of Systems  Constitutional: Negative.   HENT: Negative.    Cardiovascular: Negative.   Gastrointestinal: Negative.   Allergic/Immunologic: Negative.   Neurological: Negative.   Psychiatric/Behavioral: Negative.       Objective:   BP (!) 150/75 (BP Location: Right Arm, Patient Position: Sitting, Cuff Size: Normal)   Pulse 60   Temp 98.2 F (36.8 C)   Resp 14   Ht 5\' 8"  (1.727 m)   Wt 208 lb (94.3 kg)   SpO2 100%   BMI 31.63 kg/m   Wt Readings from Last 5 Encounters:  04/26/23 208 lb (94.3 kg)  04/25/23 208 lb (94.3 kg)  01/25/23 208 lb (94.3 kg)  01/18/23 205 lb 6.4 oz (93.2 kg)  01/05/23 210 lb 1.6 oz (95.3 kg)     Physical Exam Vitals and nursing note reviewed.  Constitutional:      General: He is not in acute distress.    Appearance: He is well-developed.  Cardiovascular:     Rate and Rhythm: Normal rate and regular rhythm.  Pulmonary:     Effort: Pulmonary effort is normal.     Breath sounds: Normal breath sounds.  Skin:    General: Skin is warm and dry.   Neurological:     Mental Status: He is alert and oriented to person, place, and time.       Assessment & Plan:   Essential hypertension -     CBC -     Comprehensive metabolic panel  Type 2 diabetes mellitus with other specified complication, without long-term current use of insulin (HCC) -     POCT glycosylated hemoglobin (Hb A1C)     Return in about 3 months (around 07/25/2023).   Ivonne [name], NP 04/26/2023

## 2023-04-26 NOTE — Patient Instructions (Addendum)
 1. Essential hypertension  - CBC - Comprehensive metabolic panel   Follow up:  Follow up in 3 months

## 2023-04-27 LAB — COMPREHENSIVE METABOLIC PANEL
ALT: 24 [IU]/L (ref 0–44)
AST: 23 [IU]/L (ref 0–40)
Albumin: 4.3 g/dL (ref 3.8–4.8)
Alkaline Phosphatase: 47 [IU]/L (ref 44–121)
BUN/Creatinine Ratio: 8 — ABNORMAL LOW (ref 10–24)
BUN: 10 mg/dL (ref 8–27)
Bilirubin Total: 0.3 mg/dL (ref 0.0–1.2)
CO2: 22 mmol/L (ref 20–29)
Calcium: 9.8 mg/dL (ref 8.6–10.2)
Chloride: 108 mmol/L — ABNORMAL HIGH (ref 96–106)
Creatinine, Ser: 1.2 mg/dL (ref 0.76–1.27)
Globulin, Total: 2.3 g/dL (ref 1.5–4.5)
Glucose: 104 mg/dL — ABNORMAL HIGH (ref 70–99)
Potassium: 4.6 mmol/L (ref 3.5–5.2)
Sodium: 146 mmol/L — ABNORMAL HIGH (ref 134–144)
Total Protein: 6.6 g/dL (ref 6.0–8.5)
eGFR: 63 mL/min/{1.73_m2} (ref >59)

## 2023-04-27 LAB — CBC
Hematocrit: 48.7 % (ref 37.5–51.0)
Hemoglobin: 15.8 g/dL (ref 13.0–17.7)
MCH: 30.6 pg (ref 26.6–33.0)
MCHC: 32.4 g/dL (ref 31.5–35.7)
MCV: 94 fL (ref 79–97)
Platelets: 185 10*3/uL (ref 150–450)
RBC: 5.17 x10E6/uL (ref 4.14–5.80)
RDW: 13.3 % (ref 11.6–15.4)
WBC: 12 10*3/uL — ABNORMAL HIGH (ref 3.4–10.8)

## 2023-04-30 ENCOUNTER — Telehealth: Payer: Self-pay

## 2023-04-30 NOTE — Telephone Encounter (Signed)
Patient viewed results and Doctor comment through [facility]

## 2023-04-30 NOTE — Telephone Encounter (Signed)
-----   Message from Ivonne [name] sent at 04/27/2023  8:24 AM EST ----- Kidney function was slightly off. Please stay well hydrated.

## 2023-05-07 ENCOUNTER — Encounter: Payer: Self-pay | Admitting: Cardiovascular Disease

## 2023-05-07 ENCOUNTER — Ambulatory Visit: Payer: Medicare Other | Attending: Cardiovascular Disease | Admitting: Cardiovascular Disease

## 2023-05-07 VITALS — BP 149/80 | HR 72 | Ht 68.0 in | Wt 202.0 lb

## 2023-05-07 DIAGNOSIS — E1169 Type 2 diabetes mellitus with other specified complication: Secondary | ICD-10-CM | POA: Insufficient documentation

## 2023-05-07 DIAGNOSIS — E782 Mixed hyperlipidemia: Secondary | ICD-10-CM | POA: Diagnosis present

## 2023-05-07 DIAGNOSIS — E669 Obesity, unspecified: Secondary | ICD-10-CM | POA: Insufficient documentation

## 2023-05-07 DIAGNOSIS — C61 Malignant neoplasm of prostate: Secondary | ICD-10-CM | POA: Insufficient documentation

## 2023-05-07 DIAGNOSIS — I5032 Chronic diastolic (congestive) heart failure: Secondary | ICD-10-CM | POA: Insufficient documentation

## 2023-05-07 DIAGNOSIS — D6869 Other thrombophilia: Secondary | ICD-10-CM | POA: Diagnosis not present

## 2023-05-07 DIAGNOSIS — C9110 Chronic lymphocytic leukemia of B-cell type not having achieved remission: Secondary | ICD-10-CM | POA: Diagnosis present

## 2023-05-07 DIAGNOSIS — Z952 Presence of prosthetic heart valve: Secondary | ICD-10-CM | POA: Insufficient documentation

## 2023-05-07 DIAGNOSIS — G4733 Obstructive sleep apnea (adult) (pediatric): Secondary | ICD-10-CM | POA: Insufficient documentation

## 2023-05-07 DIAGNOSIS — I482 Chronic atrial fibrillation, unspecified: Secondary | ICD-10-CM | POA: Insufficient documentation

## 2023-05-07 DIAGNOSIS — I2510 Atherosclerotic heart disease of native coronary artery without angina pectoris: Secondary | ICD-10-CM | POA: Insufficient documentation

## 2023-05-07 DIAGNOSIS — J432 Centrilobular emphysema: Secondary | ICD-10-CM | POA: Insufficient documentation

## 2023-05-07 DIAGNOSIS — I340 Nonrheumatic mitral (valve) insufficiency: Secondary | ICD-10-CM | POA: Diagnosis present

## 2023-05-07 DIAGNOSIS — I4819 Other persistent atrial fibrillation: Secondary | ICD-10-CM | POA: Diagnosis not present

## 2023-05-07 DIAGNOSIS — C775 Secondary and unspecified malignant neoplasm of intrapelvic lymph nodes: Secondary | ICD-10-CM | POA: Insufficient documentation

## 2023-05-07 DIAGNOSIS — I10 Essential (primary) hypertension: Secondary | ICD-10-CM | POA: Diagnosis present

## 2023-05-07 MED ORDER — APIXABAN 5 MG PO TABS: 5.0000 mg | ORAL_TABLET | Freq: Two times a day (BID) | ORAL | 3 refills | Status: DC

## 2023-05-07 NOTE — Patient Instructions (Signed)
Medication Instructions:  No changes Ask your Pharmacist how much it would cost to switch to Pradaxa 150 mg twice daily (this would replace your Eliquis) *If you need a refill on your cardiac medications before your next appointment, please call your pharmacy*  Follow-Up: At [facility], you and your health needs are our priority.  As part of our continuing mission to provide you with exceptional heart care, we have created designated Provider Care Teams.  These Care Teams include your primary Cardiologist (physician) and Advanced Practice Providers (APPs -  Physician Assistants and Nurse Practitioners) who all work together to provide you with the care you need, when you need it.  We recommend signing up for the patient portal called "MyChart".  Sign up information is provided on this After Visit Summary.  MyChart is used to connect with patients for Virtual Visits (Telemedicine).  Patients are able to view lab/test results, encounter notes, upcoming appointments, etc.  Non-urgent messages can be sent to your provider as well.   To learn more about what you can do with MyChart, go to [email or web address].    Your next appointment:   6 month(s)  Provider:   Thurmon Fair, MD

## 2023-05-07 NOTE — Progress Notes (Signed)
Cardiology Office Note    Date:  05/07/2023   ID:  Rodney Friedman, Rodney Friedman Oct 25, 1945, MRN [id number]  PCP:  Rodney [name], NP  Cardiologist:   Rodney Fair, MD   chief complaint: CAD, AFib, AS, HTN, HLP   History of Present Illness:  Rodney Friedman is a 77 y.o. male with CAD and remote CABG (three-vessel, March 2006; LIMA to LAD , SVG to diagonal and SVG to distal OM, all patent by coronary angiography 01/20/2021), longstanding persistent atrial fibrillation, aortic stenosis s/p TAVR (26 mm Edwards SAPIEN 3 ultra THV September 2022), severe mitral annular calcification with mitral regurgitation (presumably ischemic mechanism), persistent atrial fibrillation, carotid artery stenosis, hypertension, hyperlipidemia and type 2 diabetes mellitus, mild centrilobular emphysema.  He presented with an acute lacunar left thalamic infarct and a left sublingual cortical infarct, presumably embolic in August 2024.  He was switched from warfarin to Eliquis at that time.  He feels quite well.  Paradoxically a couple of things have improved after his stroke.  His vision is sharper.  His low back pain from disc disease is also 75% improved.  He is unaware of atrial fibrillation.  He has not had any bleeding problems on Eliquis, but struggles with the cost of the medication.  He denies shortness of breath at rest or with activity.  He has not had any chest pain.  It's been over 2 years since he quit smoking.  Most recent echo at the time of his stroke in August 2024 showed EF 55 to 60%, indeterminate diastolic function, severe biatrial dilation, mild mitral stenosis and mild-moderate mitral insufficiency due to severe mitral and calcification (mean gradient 6 mmHg) and normal function of the TAVR prosthesis with a mean gradient of 13 mmHg.  ECGs have all shown atrial fibrillation in the last 3-1/2 years (last documentation of sinus rhythm was in April 2021).  He was prescribed losartan at  discharge from poststroke rehab, but has not taken it.  His blood pressure today is borderline high.  He is taking metoprolol and has good ventricular rate control.  Metabolic control is good with a hemoglobin A1c of 6.5%, LDL of 28.  He has a chronically low HDL at 32 with normal triglycerides.  He has normal renal function.  His prostate cancer is currently considered to be in remission.  He also sees Rodney Friedman such for CLL but his most recent WBC was actually lower at 12 K (5K lymphocytes).  He does not have anemia or thrombocytopenia.   Rodney Friedman's father passed away in 2020/05/21 at age 27.  He was very dedicated to his father's care and misses him.  However, his level of stress has gone down tremendously. He  is well versed in the theoretical aspects of addiction since he has a Masters degree on the topic.  He has a history of coronary disease and underwent 3 vessel bypass surgery in [place] in 2005/05/21 ("heartburn" and left antecubital pain ).  All his bypass grafts were open by angiography in September 2022.  He underwent TAVR for severe aortic stenosis shortly thereafter (Edwards S3 U 26 mm).  He has had paroxysmal atrial fibrillation with RVR off and on, generally asymptomatic.Marland Kitchen His nuclear stress test in June 2013 showed normal pattern of perfusion and an ejection fraction of 52%. He has bilateral carotid bruits that are reportedly due to external carotid artery stenoses. His last carotid ultrasound was performed in September 2022 and showed no significant internal carotid stenosis.  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 He has treated prostate cancer and is followed by Rodney Friedman.  He has a long history of alcohol and drug use but has been sober for over 15 years and still goes to [employer]  He finally successfully quit smoking permanently in 2022, around the time of his TAVR.  Past Medical History:  Diagnosis Date   Adenomatous colon  polyp    Alcoholism (HCC)    Anxiety    Atrial fibrillation (HCC)    CAD (coronary artery disease)    Cataract    removed left eye    CHF (congestive heart failure) (HCC) 12/16/2020   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 Clifton Friedman & Dr. Laneta Friedman   Sleep apnea    Substance abuse (HCC)    Tremor, essential 09/22/2015    Past Surgical History:  Procedure Laterality Date   Arm Surgery Right    from door accident with glass   CARDIAC CATHETERIZATION     CATARACT EXTRACTION W/ INTRAOCULAR LENS IMPLANT Bilateral    COLONOSCOPY     CORONARY ARTERY BYPASS GRAFT  08/04/2004   EYE SURGERY     POLYPECTOMY     RIGHT/LEFT HEART CATH AND CORONARY/GRAFT ANGIOGRAPHY N/A 01/20/2021   Procedure: RIGHT/LEFT HEART CATH AND CORONARY/GRAFT ANGIOGRAPHY;  Surgeon: Rodney Hazel, MD;  Location: [facility] INVASIVE CV LAB;  Service: Cardiovascular;  Laterality: N/A;   TEE WITHOUT CARDIOVERSION N/A 02/08/2021   Procedure: TRANSESOPHAGEAL ECHOCARDIOGRAM (TEE);  Surgeon: Rodney Hazel, MD;  Location: [facility] INVASIVE CV LAB;  Service: Open Heart Surgery;  Laterality: N/A;   TRANSCATHETER AORTIC VALVE REPLACEMENT, CAROTID Left 02/08/2021    Procedure: TRANSCATHETER AORTIC VALVE REPLACEMENT, LEFT CAROTID;  Surgeon: Rodney Hazel, MD;  Location: [facility] INVASIVE CV LAB;  Service: Open Heart Surgery;  Laterality: Left;    Current Medications: Outpatient Medications Prior to Visit  Medication Sig Dispense Refill   acetaminophen (TYLENOL) 325 MG tablet Take 1-2 tablets (325-650 mg total) by mouth every 4 (four) hours as needed for mild pain.     amoxicillin (AMOXIL) 500 MG tablet Take 4 tablets (2000 mg) by mouth ONE HOUR before any dental procedures. 12 tablet 4   buPROPion (WELLBUTRIN SR) 200 MG 12 hr tablet Take 1 tablet (200 mg total) by mouth every morning. 90 tablet 2   buPROPion ER (WELLBUTRIN SR) 100 MG 12 hr tablet 1 qhs 90 tablet 4   docusate sodium (COLACE) 100 MG capsule Take 1 capsule (100 mg total) by  mouth 2 (two) times daily.     ezetimibe (ZETIA) 10 MG tablet Take 1 tablet (10 mg total) by mouth daily. 90 tablet 3   gabapentin (NEURONTIN) 300 MG capsule Take 1 capsule (300 mg total) by mouth at bedtime.     latanoprost (XALATAN) 0.005 % ophthalmic solution Place 1 drop into the left eye every other day.     metFORMIN (GLUCOPHAGE) 500 MG tablet Take 500 mg by mouth 2 (two) times daily with a meal.     metoprolol succinate (TOPROL-XL) 50 MG 24 hr tablet Take 1 tablet (50 mg total) by mouth 2 (two) times daily. Take with or immediately following a meal. 180 tablet 0   rosuvastatin (CRESTOR) 20 MG tablet Take 1 tablet (20 mg total) by mouth daily. 90 tablet 3   apixaban (ELIQUIS) 5 MG TABS tablet Take 1 tablet (5 mg total) by mouth 2 (two) times daily. 180 tablet 1   No facility-administered medications prior to visit.     Allergies:   Other, Cayenne, and Poison ivy extract   Social History   Socioeconomic History   Marital status: Divorced    Spouse name: Not on file   Number of children: 2   Years of education: 16   Highest education level: Master's degree (e.g., MA, MS, MEng, MEd, MSW, MBA)  Occupational  History   Occupation: Retired Runner, broadcasting/film/video  Tobacco Use   Smoking status: Former    Current packs/day: 0.00    Average packs/day: 0.8 packs/day for 59.0 years (44.3 ttl pk-yrs)    Types: Cigarettes    Start date: 12/16/1961    Quit date: 12/16/2020    Years since quitting: 2.3    Passive exposure: Past   Smokeless tobacco: Never  Vaping Use   Vaping status: Never Used  Substance and Sexual Activity   Alcohol use: No    Alcohol/week: 0.0 standard drinks of alcohol   Drug use: No   Sexual activity: Not Currently  Other Topics Concern   Not on file  Social History Narrative   Lives alone   Divorced   Right-handed   Education: College   No caffeine   Social Drivers of Health   Financial Resource Strain: Medium Risk (04/22/2023)   Overall Financial Resource Strain (CARDIA)    Difficulty of Paying Living Expenses: Somewhat hard  Food Insecurity: No Food Insecurity (04/22/2023)   Hunger Vital Sign    Worried About Running Out of Food in the Last Year: Never true    Ran Out of Food in the Last Year: Never true  Transportation Needs: No Transportation Needs (04/22/2023)   PRAPARE - Administrator, Civil Service (Medical): No    Lack of Transportation (Non-Medical): No  Recent Concern: Transportation Needs - Unmet Transportation Needs (02/05/2023)   PRAPARE - Transportation    Lack of Transportation (Medical): No    Lack of Transportation (Non-Medical): Yes  Physical Activity: Sufficiently Active (04/22/2023)   Exercise Vital Sign    Days of Exercise per Week: 5 days    Minutes of Exercise per Session: 30 min  Recent Concern: Physical Activity - Insufficiently Active (02/05/2023)   Exercise Vital Sign    Days of Exercise per Week: 4 days    Minutes of Exercise per Session: 30 min  Stress: Stress Concern Present (04/22/2023)   [employer] of Occupational Health - Occupational Stress Questionnaire    Feeling of Stress : Very much  Social Connections: Moderately  Integrated (04/22/2023)   Social  Connection and Isolation Panel [NHANES]    Frequency of Communication with Friends and Family: More than three times a week    Frequency of Social Gatherings with Friends and Family: More than three times a week    Attends Religious Services: More than 4 times per year    Active Member of [employer] or Organizations: Yes    Attends Engineer, structural: More than 4 times per year    Marital Status: Divorced     Family History:  The patient's family history includes Colon cancer in his mother; Hypertension in his father; Ovarian cancer in his mother; Prostate cancer in his father; Uterine cancer in his mother.   ROS:   Please see the history of present illness.   All other systems are reviewed and are negative.   PHYSICAL EXAM:   VS:  BP (!) 149/80   Pulse 72   Ht 5\' 8"  (1.727 m)   Wt 202 lb (91.6 kg)   BMI 30.71 kg/m      General: Alert, oriented x3, no distress, appears well Head: no evidence of trauma, PERRL, EOMI, no exophtalmos or lid lag, no myxedema, no xanthelasma; normal ears, nose and oropharynx Neck: normal jugular venous pulsations and no hepatojugular reflux; brisk carotid pulses without delay and no carotid bruits Chest: clear to auscultation, no signs of consolidation by percussion or palpation, normal fremitus, symmetrical and full respiratory excursions Cardiovascular: normal position and quality of the apical impulse, irregular rhythm, normal first and second heart sounds, 1-2/6 aortic ejection murmur early peaking, no diastolic murmurs, rubs or gallops Abdomen: no tenderness or distention, no masses by palpation, no abnormal pulsatility or arterial bruits, normal bowel sounds, no hepatosplenomegaly Extremities: no clubbing, cyanosis or edema; 2+ radial, ulnar and brachial pulses bilaterally; 2+ right femoral, posterior tibial and dorsalis pedis pulses; 2+ left femoral, posterior tibial and dorsalis pedis pulses; no subclavian or  femoral bruits Neurological: grossly nonfocal Psych: Normal mood and affect    HYPERTENSION CONTROL Vitals:   05/07/23 0819 05/07/23 0839  BP: (!) 150/80 (!) 149/80    The patient's blood pressure is elevated above target today.  In order to address the patient's elevated BP: Blood pressure will be monitored at home to determine if medication changes need to be made.       Wt Readings from Last 3 Encounters:  05/07/23 202 lb (91.6 kg)  04/26/23 208 lb (94.3 kg)  04/25/23 208 lb (94.3 kg)      Studies/Labs Reviewed:   ECHO 01/27/2022    1. Severe hypertrophy of the basal septum with otherwise mild concentric  LVH. Inferior and inferoseptal hypokinesis. Left ventricular ejection  fraction, by estimation, is 50 to 55%. The left ventricle has low normal  function. The left ventricle  demonstrates regional wall motion abnormalities (see scoring  diagram/findings for description). There is severe left ventricular  hypertrophy of the basal-septal segment. Left ventricular diastolic  function could not be evaluated.   2. Right ventricular systolic function is normal. The right ventricular  size is normal. There is normal pulmonary artery systolic pressure.   3. Left atrial size was severely dilated.   4. The mitral valve is degenerative. Mild mitral valve regurgitation. No  evidence of mitral stenosis. Moderate mitral annular calcification.   5. TAVR gradients unchanged from 02/2021. The aortic valve has been  repaired/replaced. Aortic valve regurgitation is not visualized. No aortic  stenosis is present. Echo findings are consistent with normal structure  and function of the  aortic valve  prosthesis. Aortic valve mean gradient measures 7.3 mmHg. Aortic valve  Vmax measures 1.95 m/s.   6. The inferior vena cava is normal in size with greater than 50%  respiratory variability, suggesting right atrial pressure of 3 mmHg.   AV Vmax:           195.00 cm/s  AV VTI:             0.315 m  AV Peak Grad:      15.2 mmHg  AV Mean Grad:      7.3 mmHg  LVOT VTI:          0.221 m  LVOT/AV VTI ratio: 0.70   EKG:    EKG Interpretation Date/Time:  Monday May 07 2023 08:22:52 EST Ventricular Rate:  72 PR Interval:    QRS Duration:  118 QT Interval:  406 QTC Calculation: 444 R Axis:   -39  Text Interpretation: Atrial fibrillation Left axis deviation Left ventricular hypertrophy with QRS widening and repolarization abnormality ( R in aVL , Cornell product ) When compared with ECG of 02-Jan-2023 01:37, No significant change since last tracing Confirmed by Lillieann Pavlich ([id number]) on 05/07/2023 8:24:02 AM         Recent Labs: 01/08/2023: Magnesium 1.8 04/26/2023: ALT 24; BUN 10; Creatinine, Ser 1.20; Hemoglobin 15.8; Platelets 185; Potassium 4.6; Sodium 146 K; WBC 12K Hemoglobin A1c 6.5%.  Lipid Panel    Component Value Date/Time   CHOL 88 01/03/2023 0321   CHOL 99 (L) 05/26/2022 0908   TRIG 140 01/03/2023 0321   HDL 32 (L) 01/03/2023 0321   HDL 34 (L) 05/26/2022 0908   CHOLHDL 2.8 01/03/2023 0321   VLDL 28 01/03/2023 0321   LDLCALC 28 01/03/2023 0321   LDLCALC 42 05/26/2022 0908    ASSESSMENT:    1. Persistent atrial fibrillation (HCC)   2. S/P TAVR (transcatheter aortic valve replacement)   3. Nonrheumatic mitral valve regurgitation   4. Acquired thrombophilia (HCC)   5. Chronic diastolic CHF (congestive heart failure) (HCC)   6. Coronary artery disease involving native coronary artery of native heart without angina pectoris   7. Mixed hyperlipidemia   8. Type 2 diabetes mellitus with obesity (HCC)   9. Essential hypertension   10. Centrilobular emphysema (HCC)   11. OSA (obstructive sleep apnea)   12. Mild obesity   13. CLL (chronic lymphocytic leukemia) (HCC)   14. 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 Chronic atrial fibrillation (HCC)       PLAN:  In order of problems listed above:  AFib: Anticoagulation  switched from warfarin to apixaban when he had a stroke in August 2024.  Rate controlled, asymptomatic, probably permanent.  Sinus rhythm has not been documented since April 2021.  He is on anticoagulants and has good ventricular rate control.  He has a severely dilated left atrium which makes it less likely that we will be successful long-term maintenance of normal rhythm even after cardioversion. CHADSVasc 7 (age, HTN, CAD, DM, stroke).  AS s/p TAVR: Normally functioning prosthesis.  Aware of the need for endocarditis prophylaxis with dental procedures. MR: Degenerative mitral valve disease with mild mitral stenosis and mild-moderate mitral insufficiency. Anticoagulation: The cost of Eliquis is high for him about $100 a month.  Asked him to ask his pharmacist if dabigatran 150 mg twice daily would be cheaper. CHF: Normal LVEF, clinically euvolemic without diuretics, NYHA functional class I-2.  Has not had overt heart failure since  TAVR. CAD s/p CABG: He does not have any angina on beta-blocker monotherapy.  All 3 bypass grafts remain patent by cardiac catheterization in 2022.  Not on aspirin due to full anticoagulation for atrial fibrillation. HLP: Continue current treatment with high-dose rosuvastatin plus ezetimibe.  Chronically low HDL, but excellent LDL cholesterol. DM: Good glycemic control on metformin monotherapy. HTN: Blood pressure is a little high today.  He avoids salty foods and does not add salt to his meals.  He is only on metoprolol.  He thinks his blood pressure is actually lower at home.  Will have him keep a log for couple of weeks and send this to [place] after Christmas. COPD: Quit smoking almost 3 years ago.  Recent PFTs were not bad.  He does have some centrilobular emphysema on CT. Carotid bruits: He has bilateral carotid bruits due to external carotid artery stenoses, but did not have significant internal carotid artery stenoses on ultrasound. OSA: He does not have daytime  hypersomnolence.  He does not tolerate CPAP. Obesity: Remains mildly obese, but weight stable and well below his peak weight a couple of years ago. CLL: Relatively low WBC at about 12000-14000 without anemia or thrombocytopenia.  Followed by Rodney Friedman. Prostate Ca: PSA barely detectable.  Had initial treatment with radiation therapy several years ago and then last year a few sessions of radiation therapy for recurrent lymphadenopathy in the pelvis.  On Lupron which he tolerates well.    Medication Adjustments/Labs and Tests Ordered: Current medicines are reviewed at length with the patient today.  Concerns regarding medicines are outlined above.  Medication changes, Labs and Tests ordered today are listed in the Patient Instructions below. Patient Instructions  Medication Instructions:  No changes Ask your Pharmacist how much it would cost to switch to Pradaxa 150 mg twice daily (this would replace your Eliquis) *If you need a refill on your cardiac medications before your next appointment, please call your pharmacy*  Follow-Up: At [facility], you and your health needs are our priority.  As part of our continuing mission to provide you with exceptional heart care, we have created designated Provider Care Teams.  These Care Teams include your primary Cardiologist (physician) and Advanced Practice Providers (APPs -  Physician Assistants and Nurse Practitioners) who all work together to provide you with the care you need, when you need it.  We recommend signing up for the patient portal called "MyChart".  Sign up information is provided on this After Visit Summary.  MyChart is used to connect with patients for Virtual Visits (Telemedicine).  Patients are able to view lab/test results, encounter notes, upcoming appointments, etc.  Non-urgent messages can be sent to your provider as well.   To learn more about what you can do with MyChart, go to [email or web address].    Your next  appointment:   6 month(s)  Provider:   Thurmon Fair, MD       Signed, Rodney Fair, MD  05/07/2023 9:06 AM    [facility] [address] [place], [place], [place]  [address] Phone: [phone]; Fax: [phone]

## 2023-05-08 ENCOUNTER — Ambulatory Visit (INDEPENDENT_AMBULATORY_CARE_PROVIDER_SITE_OTHER): Payer: Medicare Other | Admitting: Licensed Clinical Social Worker

## 2023-05-08 ENCOUNTER — Encounter: Payer: Self-pay | Admitting: Cardiovascular Disease

## 2023-05-08 DIAGNOSIS — F339 Major depressive disorder, recurrent, unspecified: Secondary | ICD-10-CM

## 2023-05-12 ENCOUNTER — Encounter: Payer: Self-pay | Admitting: Pulmonary Disease

## 2023-05-13 ENCOUNTER — Encounter (HOSPITAL_COMMUNITY): Payer: Self-pay | Admitting: Licensed Clinical Social Worker

## 2023-05-13 NOTE — Progress Notes (Signed)
   THERAPIST PROGRESS NOTE  Session Time: 8:00am-8:55am  Participation Level: Active  Behavioral Response: Well GroomedAlertDepressed  Type of Therapy: Individual Therapy  Treatment Goals addressed: "to cope with stress and manage depression and anxiety". Hansel will report improved stress management and coping skills, resulting in mood stability and less worrying 4 out of 7 days.    ProgressTowards Goals: Progressing  Interventions: CBT  Summary: Cassie Fincke is a 77 y.o. male who presents with MDD, recurrent, chronic.   Suicidal/Homicidal: Nowithout intent/plan  Therapist Response: Baley engaged well in individual session with clinician in office.  Clinician utilized CBT to process thoughts feelings and behaviors.  Clinician provided time and space for Ellian to share her frustrations and worries.  Clinician validated these concerns and noted that little can be done to change the daily struggles of everyday life.  However, clinician explored positives that are happening at home and with family ends.  Clinician encouraged Akilan to focus on more positive advances and incorporate more gratitude into his life.  Clinician reminded Hrishikesh that what you focus on will be what you find.  Onecimo was able to identify 5 very positives in his life.  Clinician encouraged this practice daily.  Jlyn shared supportive friendships are some you in his life.  He shared that he has more friends now than he has since college.  plan: Return again in 2-3 weeks.  Diagnosis: Major depression, recurrent, chronic (HCC)  Collaboration of Care: Medication Management AEB provided updates to Dr. Donell Beers  Patient/Guardian was advised Release of Information must be obtained prior to any record release in order to collaborate their care with an outside provider. Patient/Guardian was advised if they have not already done so to contact the registration department to sign all necessary forms in order for [place] to release  information regarding their care.   Consent: Patient/Guardian gives verbal consent for treatment and assignment of benefits for services provided during this visit. Patient/Guardian expressed understanding and agreed to proceed.   Chryl Heck [place], LCSW 05/13/2023

## 2023-05-18 NOTE — Telephone Encounter (Signed)
Dr. Francine Graven, Please see patient message.  Thank you.

## 2023-05-29 ENCOUNTER — Ambulatory Visit (HOSPITAL_COMMUNITY): Payer: Medicare Other | Admitting: Licensed Clinical Social Worker

## 2023-06-09 ENCOUNTER — Encounter (HOSPITAL_COMMUNITY): Payer: Self-pay

## 2023-06-11 ENCOUNTER — Ambulatory Visit (INDEPENDENT_AMBULATORY_CARE_PROVIDER_SITE_OTHER): Payer: Medicare Other | Admitting: Nurse Practitioner

## 2023-06-11 ENCOUNTER — Encounter: Payer: Self-pay | Admitting: Nurse Practitioner

## 2023-06-11 VITALS — BP 150/90 | HR 65 | Temp 97.0°F | Wt 216.1 lb

## 2023-06-11 DIAGNOSIS — I10 Essential (primary) hypertension: Secondary | ICD-10-CM | POA: Diagnosis not present

## 2023-06-11 DIAGNOSIS — M25561 Pain in right knee: Secondary | ICD-10-CM | POA: Diagnosis not present

## 2023-06-11 DIAGNOSIS — W19XXXA Unspecified fall, initial encounter: Secondary | ICD-10-CM | POA: Insufficient documentation

## 2023-06-11 DIAGNOSIS — W19XXXD Unspecified fall, subsequent encounter: Secondary | ICD-10-CM

## 2023-06-11 NOTE — Progress Notes (Signed)
Acute Office Visit  Subjective:     Patient ID: Rodney Friedman, male    DOB: [birth year], 78 y.o.   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 Patient is in the office today for complaints of fall.  Patient stated that he fell 3 weeks ago at home while trying to hit a shelf.  Stated that he did see orthopedics after the fall they did an x-ray and he was told that there was nothing critical noted on the x-ray, they had told him to take extra strength Tylenol which she has been taking.  He is also wearing a knee brace.  Stated that his pain is much better, his pain is a 2-3/10 currently.  Stated that he had requested for the results of his x-ray to be faxed to this office but he refused, he had to pay $10 to get a CD of the x-ray.   Patient currently lives home alone and has no family or friends to call if he needs help.  He has a son that lives in [place] but he works a lot and he might not able  be able to reach him quickly if he does need help.  Stated that he got 3 guys from  [facility]cohol[facility] to come and help him at home recently, has a past history of alcohol abuse stated that he  has been sober for many years.  Stated that when he fell he was not able to drive and he would have needed home health services at that time.  Patient feels like he is doing well now and does not need home health services.  He would like a referral sent to [facility]  if he needs home health in the future.  He has a wheelchair, cane, walker, shower chairs, grab bars at home.  Stated that he had inpatient therapy after he had a stroke in August, feels like he knows all the exercises to do and does not want referral to physical therapy at this time.  He is now able to drive able to do his groceries by himself.  He sees a therapist for his depression, stated that therapy has been helpful     Review of Systems  Constitutional:  Negative for appetite change, chills, fatigue and fever.  HENT:  Negative for congestion, postnasal drip, rhinorrhea and sneezing.   Respiratory:  Negative for cough, shortness of breath and wheezing.   Cardiovascular:  Negative for chest pain, palpitations and leg swelling.  Gastrointestinal:  Negative for abdominal pain, constipation, nausea and vomiting.  Genitourinary:  Negative for difficulty urinating, dysuria, flank pain and frequency.  Musculoskeletal:  Positive for arthralgias. Negative for back pain, joint swelling and myalgias.  Skin:  Negative for color change, pallor, rash and wound.  Neurological:  Negative for dizziness, facial asymmetry, weakness, numbness and headaches.  Psychiatric/Behavioral:  Negative for behavioral problems, confusion, self-injury and suicidal  ideas.         Objective:    BP (!) 150/90   Pulse 65   Temp (!) 97 F (36.1 C)   Wt 216 lb 0.8 oz (98 kg)   SpO2 100%   BMI 32.85 kg/m    Physical Exam Vitals and nursing note reviewed.  Constitutional:      General: He is not in acute distress.    Appearance: Normal appearance. He is obese. He is not ill-appearing, toxic-appearing or diaphoretic.  HENT:     Mouth/Throat:     Mouth:  Mucous membranes are moist.     Pharynx: Oropharynx is clear. No oropharyngeal exudate or posterior oropharyngeal erythema.  Eyes:     General: No scleral icterus.       Right eye: No discharge.        Left eye: No discharge.     Extraocular Movements: Extraocular movements intact.     Conjunctiva/sclera: Conjunctivae normal.  Cardiovascular:     Rate and Rhythm: Normal rate and regular rhythm.     Pulses: Normal pulses.     Heart sounds: Normal heart sounds. No murmur heard.    No friction rub. No gallop.  Pulmonary:     Effort: Pulmonary effort is normal. No respiratory distress.     Breath sounds: Normal breath sounds. No stridor. No wheezing, rhonchi or rales.  Chest:     Chest wall: No tenderness.  Abdominal:     General: There is no distension.     Palpations: Abdomen is soft.     Tenderness: There is no abdominal tenderness. There is no right CVA tenderness, left CVA tenderness or guarding.  Musculoskeletal:        General: Tenderness and signs of injury present. No swelling or deformity.     Right lower leg: No edema.     Left lower leg: No edema.     Comments: Healing scars noted on the right knee, no redness, swelling or drainage noted.  Wearing a knee brace.  Able to ambulate using a cane  Skin:    General: Skin is warm and dry.     Capillary Refill: Capillary refill takes 2 to 3 seconds.     Coloration: Skin is not jaundiced or pale.     Findings: No bruising, erythema or lesion.  Neurological:     Mental Status: He is alert and oriented to person, place, and time.     Motor: No weakness.     Coordination: Coordination normal.     Gait: Gait abnormal.  Psychiatric:        Mood and Affect: Mood normal.        Behavior: Behavior normal.        Thought Content: Thought content normal.        Judgment: Judgment normal.     No results found for any visits on 06/11/23.      Assessment & Plan:   Problem List Items Addressed This Visit       Cardiovascular  and Mediastinum   Essential hypertension   BP Readings from Last 3 Encounters:  06/11/23 (!) 150/90  05/07/23 (!) 149/80  04/26/23 (!) 150/75  Stated that he took his blood pressure medication less than an hour ago Encouraged to monitor blood pressure at home blood pressure at home and reports blood pressure readings greater then 130/80 ,goal is less than 130/80 Continue metoprolol 50 mg twice daily DASH diet advised, encouraged to engage in regular moderate exercises  as tolerated        Other   Acute pain of right knee - Primary   Seen by orthopedics they suspected that his pain was due to soft tissue contusion and possible flareup of underlying arthritis.  Continue Tylenol and use of knee brace as needed Fall prevention education completed  Results of knee x-ray requested from orthopedics, patient would like to have his PCP review the results of the x-ray Encouraged to keep his upcoming appointment with orthopedics as planned.      Fall   Fall prevention education completed at in the office today Declined referral to PT , does not think that he needs home health services at home at this time      Relevant Orders   AMB Referral [facility] Care Management    No orders of the defined types were placed in this encounter.   No follow-ups on file.  Donell Beers, FNP

## 2023-06-11 NOTE — Assessment & Plan Note (Addendum)
BP Readings from Last 3 Encounters:  06/11/23 (!) 150/90  05/07/23 (!) 149/80  04/26/23 (!) 150/75  Stated that he took his blood pressure medication less than an hour ago Encouraged to monitor blood pressure at home blood pressure at home and reports blood pressure readings greater then 130/80 ,goal is less than 130/80 Continue metoprolol 50 mg twice daily DASH diet advised, encouraged to engage in regular moderate exercises as tolerated

## 2023-06-11 NOTE — Assessment & Plan Note (Addendum)
Fall prevention education completed at in the office today Declined referral to PT , does not think that he needs home health services at home at this time

## 2023-06-11 NOTE — Assessment & Plan Note (Addendum)
Seen by orthopedics they suspected that his pain was due to soft tissue contusion and possible flareup of underlying arthritis.  Continue Tylenol and use of knee brace as needed Fall prevention education completed  Results of knee x-ray requested from orthopedics, patient would like to have his PCP review the results of the x-ray Encouraged to keep his upcoming appointment with orthopedics as planned.

## 2023-06-11 NOTE — Patient Instructions (Signed)
Around 3 times per week, check your blood pressure 2 times per day. once in the morning and once in the evening. The readings should be at least one minute apart. Write down these values and bring them to your next nurse visit/appointment.  When you check your BP, make sure you have been doing something calm/relaxing 5 minutes prior to checking. Both feet should be flat on the floor and you should be sitting. Use your left arm and make sure it is in a relaxed position (on a table), and that the cuff is at the approximate level/height of your heart.   Blood pressure goal is less than 130/80.    It is important that you exercise regularly at least 30 minutes 5 times a week as tolerated  Think about what you will eat, plan ahead. Choose " clean, green, fresh or frozen" over canned, processed or packaged foods which are more sugary, salty and fatty. 70 to 75% of food eaten should be vegetables and fruit. Three meals at set times with snacks allowed between meals, but they must be fruit or vegetables. Aim to eat over a 12 hour period , example 7 am to 7 pm, and STOP after  your last meal of the day. Drink water,generally about 64 ounces per day, no other drink is as healthy. Fruit juice is best enjoyed in a healthy way, by EATING the fruit.  Thanks for choosing [facility] we consider it a privelige to serve you.

## 2023-06-12 ENCOUNTER — Telehealth: Payer: Self-pay | Admitting: *Deleted

## 2023-06-12 ENCOUNTER — Ambulatory Visit (INDEPENDENT_AMBULATORY_CARE_PROVIDER_SITE_OTHER): Payer: Medicare Other | Admitting: Licensed Clinical Social Worker

## 2023-06-12 DIAGNOSIS — F339 Major depressive disorder, recurrent, unspecified: Secondary | ICD-10-CM | POA: Diagnosis not present

## 2023-06-12 NOTE — Progress Notes (Signed)
Complex Care Management Note  Care Guide Note 06/12/2023 Name: Rodney Friedman MRN: [id number] DOB: 1946/05/03  [name] is a 78 y.o. year old male who sees Ivonne [name], NP for primary care. I reached out to [name] by phone today to offer complex care management services.  [name] was given information about Complex Care Management services today including:   The Complex Care Management services include support from the care team which includes your Nurse Coordinator, Clinical Social Worker, or Pharmacist.  The Complex Care Management team is here to help remove barriers to the health concerns and goals most important to you. Complex Care Management services are voluntary, and the patient may decline or stop services at any time by request to their care team member.   Complex Care Management Consent Status: Patient agreed to services and verbal consent obtained.   Follow up plan:  Telephone appointment with complex care management team member scheduled for:  06/22/2023  Encounter Outcome:  Patient Scheduled  Burman Nieves, CMA, Care Guide [facility]  [facility], [facility] Guide Direct Dial: [phone]  Fax: [phone] Website: [email or web address]

## 2023-06-18 ENCOUNTER — Encounter (HOSPITAL_COMMUNITY): Payer: Self-pay | Admitting: Licensed Clinical Social Worker

## 2023-06-18 NOTE — Progress Notes (Signed)
   THERAPIST PROGRESS NOTE  Session Time: 8:00am-8:55am  Participation Level: Active  Behavioral Response: NeatAlertIrritable  Type of Therapy: Individual Therapy  Treatment Goals addressed: "to cope with stress and manage depression and anxiety". Rodney Friedman will report improved stress management and coping skills, resulting in mood stability and less worrying 4 out of 7 days.      ProgressTowards Goals: Progressing  Interventions: CBT  Summary: Rodney Friedman is a 78 y.o. male who presents with MDD, recurrent, chronic.   Suicidal/Homicidal: Nowithout intent/plan  Therapist Response: Rodney Friedman engaged well in individual and person session with clinician.  Clinician utilized CBT to process thoughts feelings and interactions.  Clinician explored updates with family and with Rodney Friedman's physical health.  Rodney Friedman shared ongoing concerns and frustrations with aging, fear that one day he will not be able to take care of himself, and predicting "the end ".  Clinician provided time and space for processing and validated his concerns.  Clinician discussed thought processes and helpfulness of these thoughts.  Clinician provided referral resources for assistance with driving to appointments, help around the house, or any other support services needed.  Clinician identified that spending months and months worrying about issues that may not even be a problem may be increasing his depression rather than alleviating his concerns.  Clinician discussed the importance of preparation versus anticipation.  Rodney Friedman shared that his life has become all consumed with preparing for the worst/anticipating the worst possible outcome.  Clinician encouraged Rodney Friedman to challenge those thoughts as they arise due to his high level of preparedness.  Plan: Return again in 2 weeks.  Diagnosis: Major depression, recurrent, chronic (HCC)  Collaboration of Care: Psychiatrist AEB updated Dr. Donell Beers  Patient/Guardian was advised Release of  Information must be obtained prior to any record release in order to collaborate their care with an outside provider. Patient/Guardian was advised if they have not already done so to contact the registration department to sign all necessary forms in order for [place] to release information regarding their care.   Consent: Patient/Guardian gives verbal consent for treatment and assignment of benefits for services provided during this visit. Patient/Guardian expressed understanding and agreed to proceed.   Rodney Heck [place], LCSW 06/18/2023

## 2023-06-19 ENCOUNTER — Ambulatory Visit (HOSPITAL_COMMUNITY): Payer: Medicare Other | Admitting: Psychiatry

## 2023-06-20 ENCOUNTER — Ambulatory Visit (HOSPITAL_BASED_OUTPATIENT_CLINIC_OR_DEPARTMENT_OTHER): Payer: Medicare Other | Admitting: Psychiatry

## 2023-06-20 DIAGNOSIS — F325 Major depressive disorder, single episode, in full remission: Secondary | ICD-10-CM

## 2023-06-20 MED ORDER — BUPROPION HCL ER (SR) 100 MG PO TB12: ORAL_TABLET | ORAL | 4 refills | Status: DC

## 2023-06-20 MED ORDER — BUPROPION HCL ER (SR) 200 MG PO TB12: 200.0000 mg | ORAL_TABLET | Freq: Every morning | ORAL | 2 refills | Status: DC

## 2023-06-20 NOTE — Progress Notes (Signed)
Psychiatric Initial Adult Assessment   Patient Identification: Rodney Friedman MRN:  [id number] Date of Evaluation:  06/20/2023 Referral Source: Dr. Chilton Si Chief Complaint: Need follow-up care    Visit Diagnosis: Bipolar disorder, substance use disorder Bipolar disorder  History of Present Illness:   Today the patient is doing fairly well.  He is having some problem with his right knee.  He sees the Careers adviser tomorrow.  The patient is aware that he has a very small support system.  He does have a son who lives here but he is financially very limited.  The patient does have some funds from a pension and Social Security so financially he is not doing great but he is thriving.  The patient become aware of how fragile he might be.  So he is concerned about this.  Overall he continues in therapy here.  He shared with me his wish to come to see me at the other office on [address].  I told them it was fine and he will call there to make an appointment.  For now we will continue taking Wellbutrin just as prescribed.  He continues going to AA regularly.  He is physically limited by his right knee.  He still continues to drive and function reasonably well.   Substance Abuse History in the last 12 months:  Yes.    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 Pt says it causes  abnormal EKGs   HLD (hyperlipidemia)    Hypertension    Leukemia (HCC)    CLL   Myocardial infarction (HCC) 2006   Osteopenia    Osteoporosis    Prostate cancer (HCC) 07/08/2013   [facility] at [facility]- getting Lupron shot every 6 months - traces in prostate and 2 lymphnodes left hip- non focused traces per pt    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   s/p TAVR with a 23mm Edwards S3U via the TF approach by Dr. Clifton James & Dr. 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    Past Surgical History:  Procedure Laterality Date   Arm Surgery Right    from door accident with glass   CARDIAC CATHETERIZATION     CATARACT EXTRACTION W/ INTRAOCULAR LENS IMPLANT Bilateral    COLONOSCOPY     CORONARY ARTERY BYPASS GRAFT  08/04/2004   EYE SURGERY     POLYPECTOMY     RIGHT/LEFT HEART CATH AND CORONARY/GRAFT ANGIOGRAPHY N/A 01/20/2021   Procedure: RIGHT/LEFT HEART CATH AND CORONARY/GRAFT ANGIOGRAPHY;  Surgeon: Kathleene Hazel, MD;  Location: [facility] INVASIVE CV LAB;  Service: Cardiovascular;  Laterality: N/A;   TEE WITHOUT CARDIOVERSION N/A 02/08/2021   Procedure: TRANSESOPHAGEAL ECHOCARDIOGRAM (TEE);  Surgeon: Kathleene Hazel, MD;  Location: [facility] INVASIVE CV LAB;  Service: Open Heart Surgery;  Laterality: N/A;   TRANSCATHETER AORTIC VALVE REPLACEMENT, CAROTID Left 02/08/2021   Procedure: TRANSCATHETER AORTIC VALVE REPLACEMENT, LEFT CAROTID;  Surgeon: Kathleene Hazel, MD;  Location: [facility] INVASIVE CV LAB;  Service: Open Heart Surgery;  Laterality: Left;    Family Psychiatric History:   Family History:  Family History  Problem Relation Age of Onset   Colon cancer Mother    Ovarian cancer Mother    Uterine cancer Mother    Hypertension Father    Prostate cancer Father    Colon polyps Neg Hx     Social History:   Social History   Socioeconomic History   Marital status: Divorced    Spouse name: Not on file   Number  of children: 2   Years of education: 16   Highest education level: Master's degree (e.g., MA, MS, MEng, MEd, MSW, MBA)  Occupational History   Occupation: Retired Runner, broadcasting/film/video  Tobacco Use   Smoking status: Former    Current packs/day: 0.00    Average packs/day: 0.8 packs/day for 59.0 years (44.3 ttl pk-yrs)    Types: Cigarettes    Start date: 12/16/1961    Quit date: 12/16/2020    Years since quitting: 2.5    Passive exposure: Past   Smokeless tobacco: Never  Vaping Use   Vaping status: Never Used  Substance and Sexual Activity   Alcohol use: No    Alcohol/week: 0.0 standard drinks of alcohol   Drug use: No   Sexual activity: Not Currently  Other Topics Concern   Not on file  Social History Narrative   Lives alone   Divorced   Right-handed   Education: College   No caffeine   Social Drivers of Health   Financial Resource Strain: Medium Risk (06/07/2023)   Overall Financial Resource Strain (CARDIA)    Difficulty of Paying Living Expenses: Somewhat hard  Food Insecurity: No Food Insecurity (06/07/2023)   Hunger Vital Sign    Worried About Running Out of Food in the Last Year: Never true    Ran Out of Food in the Last Year: Never true  Transportation Needs: Unmet Transportation Needs (06/07/2023)   PRAPARE - Transportation    Lack of Transportation (Medical): Yes    Lack of Transportation (Non-Medical): Yes  Physical Activity: Insufficiently Active (06/07/2023)   Exercise Vital Sign    Days of Exercise per Week: 4 days    Minutes of Exercise per Session: 30 min  Stress: No Stress Concern Present (06/07/2023)   [employer] of Occupational Health - Occupational Stress Questionnaire    Feeling of Stress : Only a little  Recent Concern: Stress - Stress Concern Present (04/22/2023)   [employer] of Occupational Health - Occupational Stress Questionnaire    Feeling of Stress : Very much  Social Connections: Moderately Integrated (06/07/2023)   Social Connection and  Isolation Panel [NHANES]    Frequency of Communication with Friends and Family: Three times a week    Frequency of Social Gatherings with Friends and Family: More than three times a week    Attends Religious Services: More than 4 times per year    Active Member of [employer] or Organizations: Yes    Attends Engineer, structural: More than 4 times per year    Marital Status: Divorced    Additional Social History:   Allergies:   Allergies  Allergen Reactions   Other Rash  Allergen: "Plants and bushes while doing yard work"   [employer] Nausea And Vomiting   Poison Ivy Extract Rash    Metabolic Disorder Labs: Lab Results  Component Value Date   HGBA1C 6.5 04/26/2023   MPG 148.46 01/03/2023   MPG 128 12/17/2020   No results found for: "PROLACTIN" Lab Results  Component Value Date   CHOL 88 01/03/2023   TRIG 140 01/03/2023   HDL 32 (L) 01/03/2023   CHOLHDL 2.8 01/03/2023   VLDL 28 01/03/2023   LDLCALC 28 01/03/2023   LDLCALC 42 05/26/2022     Current Medications: Current Outpatient Medications  Medication Sig Dispense Refill   acetaminophen (TYLENOL) 325 MG tablet Take 1-2 tablets (325-650 mg total) by mouth every 4 (four) hours as needed for mild pain.     amoxicillin (AMOXIL) 500 MG tablet Take 4 tablets (2000 mg) by mouth ONE HOUR before any dental procedures. (Patient not taking: Reported on 06/11/2023) 12 tablet 4   apixaban (ELIQUIS) 5 MG TABS tablet Take 1 tablet (5 mg total) by mouth 2 (two) times daily. 180 tablet 3   buPROPion (WELLBUTRIN SR) 200 MG 12 hr tablet Take 1 tablet (200 mg total) by mouth every morning. 90 tablet 2   buPROPion ER (WELLBUTRIN SR) 100 MG 12 hr tablet 1 qhs 90 tablet 4   docusate sodium (COLACE) 100 MG capsule Take 1 capsule (100 mg total) by mouth 2 (two) times daily.     ezetimibe (ZETIA) 10 MG tablet Take 1 tablet (10 mg total) by mouth daily. 90 tablet 3   gabapentin (NEURONTIN) 300 MG capsule Take 1 capsule (300 mg total) by mouth  at bedtime.     latanoprost (XALATAN) 0.005 % ophthalmic solution Place 1 drop into the left eye every other day.     metFORMIN (GLUCOPHAGE) 500 MG tablet Take 500 mg by mouth 2 (two) times daily with a meal.     metoprolol succinate (TOPROL-XL) 50 MG 24 hr tablet Take 1 tablet (50 mg total) by mouth 2 (two) times daily. Take with or immediately following a meal. 180 tablet 0   rosuvastatin (CRESTOR) 20 MG tablet Take 1 tablet (20 mg total) by mouth daily. 90 tablet 3   No current facility-administered medications for this visit.    Neurologic: Headache: No Seizure: No Paresthesias:No  Musculoskeletal: Strength & Muscle Tone: within normal limits Gait & Station: normal Patient leans: Right  Psychiatric Specialty Exam: ROS  There were no vitals taken for this visit.There is no height or weight on file to calculate BMI.  General Appearance: Casual  Eye Contact:  Good  Speech:  Clear and Coherent  Volume:  Normal  Mood:  NA  Affect:  Appropriate  Thought Process:  Goal Directed  Orientation:  NA  Thought Content:  WDL  Suicidal Thoughts:  No  Homicidal Thoughts:  No  Memory:  Negative  Judgement:  Good  Insight:  Good  Psychomotor Activity:  Normal  Concentration:    Recall:  Fair  Fund of Knowledge:Good  Language: Good  Akathisia:  No  Handed:  Right  AIMS (if indicated):    Assets:  Desire for Improvement  ADL's:  Intact  Cognition: WNL  Sleep:      Treatment Plan Summary: 1/29/20254:52 PM    This patient's diagnosis is major depression.  He is in remission.  He continues taking Wellbutrin as prescribed.  He will continue going to AA.  His follow-up appointment will be seen on [address]  at a different office with me.  He is very stable at this time.

## 2023-06-22 ENCOUNTER — Ambulatory Visit: Payer: Self-pay

## 2023-06-22 NOTE — Patient Outreach (Signed)
  Care Coordination   Initial Visit Note   06/22/2023 Name: Rodney Friedman MRN: [id number] DOB: [birth year]  Rodney Friedman is a 78 y.o. year old male who sees Rodney [name], NP for primary care. I spoke with  Rodney Friedman by phone today.  What matters to the patients health and wellness today?  Referral from Primary care provider re: fall. Patient reports was carrying a heavy piece of equipment and knee gave out. PCP last seen on 06/11/23. Patient reports went to [facility] on yesterday and is being followed by [facility]. Patient denies the need for physical therapy at this time. Patient lives alone and limited support. 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 Limited transportation resources, states usually ask friends to provide transportation. Patient states he manages BS with Metformin and diet.-he does not check blood sugars. States he has a BP monitor that he cannot find and will get another one at some point.    Goals Addressed             This Visit's Progress    Maintain and/or improve health       Interventions Today    Flowsheet Row Most Recent Value  Chronic Disease   Chronic disease during today's visit Diabetes, Atrial Fibrillation (AFib), Hypertension (HTN), Congestive Heart Failure (CHF)  General Interventions   General Interventions Discussed/Reviewed General Interventions Discussed, Doctor Visits, Durable Medical Equipment (DME), [employer] of current treatment plan for health condition and patient's adherence to plan. assess fall risk and discussed fall prevention strategies, assess diabetes management-patient states managed with metformin. assessed for home BP checks.]  Doctor Visits Discussed/Reviewed Doctor Visits Discussed, PCP, Specialist  [reviewed instructions per PCP visit 06/11/23 with patient]  Durable Medical Equipment (DME) Rodney Friedman  PCP/Specialist Visits Compliance with follow-up visit  [reviewed upcoming appointments  with patient]  Education Interventions   Education Provided Provided Education  [Discussed [employer] center of [place] [phone]) and Financial planner and provided contact number for Transportation ([phone])]  Provided Verbal Education On Medication, When to see the doctor, Other, Insurance Plans  [advised to continue to take medications as prescribed, attend provider visit as scheduled, eat healthy. Encouraged patient to call insurance company to see if he has an OTC benefit with his plan to obtain BP monitor]  Mental Health Interventions   Mental Health Discussed/Reviewed Mental Health Discussed  [reviewed upcoming appointments with Psychiatrist,  active listening,]  Nutrition Interventions   Nutrition Discussed/Reviewed Nutrition Discussed  Pharmacy Interventions   Pharmacy Dicussed/Reviewed Pharmacy Topics Discussed  [medications reviewed]  Safety Interventions   Safety Discussed/Reviewed Safety Discussed, Fall Risk            SDOH assessments and interventions completed:  Yes  SDOH Interventions Today    Flowsheet Row Most Recent Value  SDOH Interventions   Transportation Interventions Intervention Not Indicated, Other (Comment)  [Provided contact number for [employer] [phone]  Utilities Interventions Intervention Not Indicated     Care Coordination Interventions:  Yes, provided   Follow up plan: Follow up call scheduled for 06/27/23    Encounter Outcome:  Patient Visit Completed   Rodney Sheriff, RN, MSN, BSN, CCM [place]  [facility], Population Health Case Manager Phone: [phone]

## 2023-06-22 NOTE — Patient Instructions (Signed)
Visit Information  Thank you for taking time to visit with me today. Please don't hesitate to contact me if I can be of assistance to you.   Following are the goals we discussed today:  Continue to take medications as prescribed. Continue to attend provider visits as scheduled Continue to eat healthy, lean meats, vegetables, fruits, avoid saturated and transfats Contact provider with health questions or concerns as needed  Our next appointment is by telephone on 06/27/23 at 10:30 am  Please call the care guide team at [phone] if you need to cancel or reschedule your appointment.   If you are experiencing a Mental Health or Behavioral Health Crisis or need someone to talk to, please call the Suicide and Crisis Lifeline: 988 call the [place] National Suicide Prevention Lifeline: 250-584-5861 or TTY: 938-550-5666 TTY (838) 269-5230) to talk to a trained counselor  Kathyrn Sheriff, RN, MSN, BSN, CCM [place]  [facility], Population Health Case Manager Phone: [phone]

## 2023-06-27 ENCOUNTER — Ambulatory Visit: Payer: Self-pay

## 2023-06-27 NOTE — Patient Outreach (Signed)
  Care Coordination   Follow Up Visit Note   06/27/2023 Name: Kristan Votta MRN: [id number] DOB: [birth year]  Tilman Mcclaren is a 78 y.o. year old male who sees Oley Bascom RAMAN, NP for primary care. I spoke with  [name] Quintin D'Agostino by phone today.  What matters to the patients health and wellness today?  RNCM called to continue assessment of patient care management needs. Patient reports has completed ophthalmology visit this week and is able to still drive. He state he is getting paperwork completed to take to the [facility]. He states he has an appointment with Gastroenterology on 07/06/23 re: possible colonoscopy.    Goals Addressed             This Visit's Progress    Maintain and/or improve health       Interventions Today    Flowsheet Row Most Recent Value  Chronic Disease   Chronic disease during today's visit Diabetes, Atrial Fibrillation (AFib), Congestive Heart Failure (CHF), Hypertension (HTN)  General Interventions   General Interventions Discussed/Reviewed General Interventions Reviewed, Doctor Visits, [employer] of current treatment plan for health condition and patient's adherence to plan. provided contact nuimber again for [employer] [facility] [employer] and [employer] rx transportation.]  Doctor Visits Discussed/Reviewed PCP, Barrister's Clerk, Doctor Visits Reviewed  Horticulturist, Commercial (DME) BP Cuff  [reiterated to applied materials company to see if OTC plan to obtain a BP monitor]  PCP/Specialist Visits Compliance with follow-up visit  [reviewed appointments with patient.discussed if having colonoscopy will need transportation that can stay there to take him home when finished. Patient states will discuss with provider at that appointment.]  Education Interventions   Education Provided Provided Education  Provided Verbal Education On Other, Foot Care, Medication, Community Resources  [provided positive feedback for self  management of patient's health, advised continue to take medications as prescribed, attend provider visits as scheduled, contact provider with health questions/concerns.]  Pharmacy Interventions   Pharmacy Dicussed/Reviewed Pharmacy Topics Reviewed  Safety Interventions   Safety Discussed/Reviewed --  [Patient denies any additonal fall risk. He denies the need for home health therapy at this time. Patient states he has followed up with orthopedic provider.]            SDOH assessments and interventions completed:  No  Care Coordination Interventions:  Yes, provided   Follow up plan: Follow up call scheduled for 08/02/23    Encounter Outcome:  Patient Visit Completed   Heddy Shutter, RN, MSN, BSN, CCM [place]  [facility], Population Health Case Manager Phone: [phone]

## 2023-06-27 NOTE — Patient Instructions (Signed)
Visit Information  Thank you for taking time to visit with me today. Please don't hesitate to contact me if I can be of assistance to you.   Following are the goals we discussed today:  Continue to take medications as prescribed. Continue to attend provider visits as scheduled Continue to eat healthy, lean meats, vegetables, fruits, avoid saturated and transfats Contact provider with health questions or concerns as needed Contact your insurance company to see if you have OTC benefits re: BP monitor.   Our next appointment is by telephone on 08/02/23 at 9:00 am  Please call the care guide team at [phone] if you need to cancel or reschedule your appointment.   If you are experiencing a Mental Health or Behavioral Health Crisis or need someone to talk to, please call the Suicide and Crisis Lifeline: 988 call the [place] National Suicide Prevention Lifeline: 646-528-3669 or TTY: 4422896268 TTY (506)719-3281) to talk to a trained counselor  Kathyrn Sheriff, RN, MSN, BSN, CCM [place]  [facility], Population Health Case Manager Phone: [phone]   Fall Prevention in the Home Falls can cause injuries and can happen to people of all ages. There are many things you can do to make your home safer and to help prevent falls. What actions can I take to prevent falls? General information Use good lighting in all rooms. Make sure to: Replace any light bulbs that burn out. Turn on the lights in dark areas and use night-lights. Keep items that you use often in easy-to-reach places. Lower the shelves around your home if needed. Move furniture so that there are clear paths around it. Do not use throw rugs or other things on the floor that can make you trip. If any of your floors are uneven, fix them. Add color or contrast paint or tape to clearly mark and help you see: Grab bars or handrails. First and last steps of staircases. Where the edge of each step is. If you use a  ladder or stepladder: Make sure that it is fully opened. Do not climb a closed ladder. Make sure the sides of the ladder are locked in place. Have someone hold the ladder while you use it. Know where your pets are as you move through your home. What can I do in the bathroom?     Keep the floor dry. Clean up any water on the floor right away. Remove soap buildup in the bathtub or shower. Buildup makes bathtubs and showers slippery. Use non-skid mats or decals on the floor of the bathtub or shower. Attach bath mats securely with double-sided, non-slip rug tape. If you need to sit down in the shower, use a non-slip stool. Install grab bars by the toilet and in the bathtub and shower. Do not use towel bars as grab bars. What can I do in the bedroom? Make sure that you have a light by your bed that is easy to reach. Do not use any sheets or blankets on your bed that hang to the floor. Have a firm chair or bench with side arms that you can use for support when you get dressed. What can I do in the kitchen? Clean up any spills right away. If you need to reach something above you, use a step stool with a grab bar. Keep electrical cords out of the way. Do not use floor polish or wax that makes floors slippery. What can I do with my stairs? Do not leave anything on the stairs. Make sure  that you have a light switch at the top and the bottom of the stairs. Make sure that there are handrails on both sides of the stairs. Fix handrails that are broken or loose. Install non-slip stair treads on all your stairs if they do not have carpet. Avoid having throw rugs at the top or bottom of the stairs. Choose a carpet that does not hide the edge of the steps on the stairs. Make sure that the carpet is firmly attached to the stairs. Fix carpet that is loose or worn. What can I do on the outside of my home? Use bright outdoor lighting. Fix the edges of walkways and driveways and fix any cracks. Clear paths  of anything that can make you trip, such as tools or rocks. Add color or contrast paint or tape to clearly mark and help you see anything that might make you trip as you walk through a door, such as a raised step or threshold. Trim any bushes or trees on paths to your home. Check to see if handrails are loose or broken and that both sides of all steps have handrails. Install guardrails along the edges of any raised decks and porches. Have leaves, snow, or ice cleared regularly. Use sand, salt, or ice melter on paths if you live where there is ice and snow during the winter. Clean up any spills in your garage right away. This includes grease or oil spills. What other actions can I take? Review your medicines with your doctor. Some medicines can cause dizziness or changes in blood pressure, which increase your risk of falling. Wear shoes that: Have a low heel. Do not wear high heels. Have rubber bottoms and are closed at the toe. Feel good on your feet and fit well. Use tools that help you move around if needed. These include: Canes. Walkers. Scooters. Crutches. Ask your doctor what else you can do to help prevent falls. This may include seeing a physical therapist to learn to do exercises to move better and get stronger. Where to find more information Centers for Disease Control and Prevention, STEADI: TonerPromos.no [employer] on Aging: BaseRingTones.pl National Institute on Aging: BaseRingTones.pl Contact a doctor if: You are afraid of falling at home. You feel weak, drowsy, or dizzy at home. You fall at home. Get help right away if you: Lose consciousness or have trouble moving after a fall. Have a fall that causes a head injury. These symptoms may be an emergency. Get help right away. Call 911. Do not wait to see if the symptoms will go away. Do not drive yourself to the hospital. This information is not intended to replace advice given to you by your health care provider. Make sure you  discuss any questions you have with your health care provider. Document Revised: 01/09/2022 Document Reviewed: 01/09/2022 Elsevier Patient Education  2024 [employer].

## 2023-06-29 ENCOUNTER — Encounter: Payer: Self-pay | Admitting: Hematology and Oncology

## 2023-06-29 ENCOUNTER — Encounter: Payer: Self-pay | Admitting: Cardiovascular Disease

## 2023-07-03 ENCOUNTER — Ambulatory Visit (INDEPENDENT_AMBULATORY_CARE_PROVIDER_SITE_OTHER): Payer: Medicare Other | Admitting: Licensed Clinical Social Worker

## 2023-07-03 ENCOUNTER — Encounter (HOSPITAL_COMMUNITY): Payer: Self-pay | Admitting: Licensed Clinical Social Worker

## 2023-07-03 DIAGNOSIS — F339 Major depressive disorder, recurrent, unspecified: Secondary | ICD-10-CM

## 2023-07-03 NOTE — Progress Notes (Signed)
   THERAPIST PROGRESS NOTE  Session Time: 8:00am-8:55am  Participation Level: Active  Behavioral Response: Well GroomedAlertIrritable  Type of Therapy: Individual Therapy  Treatment Goals addressed:  "to cope with stress and manage depression and anxiety". Amour will report improved stress management and coping skills, resulting in mood stability and less worrying 4 out of 7 days.    ProgressTowards Goals: Progressing  Interventions: CBT  Summary: Rodney Friedman is a 78 y.o. male who presents with MDD, recurrent, chronic.   Suicidal/Homicidal: Nowithout intent/plan  Therapist Response: Rodney Friedman engaged well in individual session with clinician in office.  Clinician utilized CBT to process thoughts feelings and interactions.  Rodney Friedman shared updates about physical health and concerns about pain in his shoulder.  Rodney Friedman spent time predicting and catastrophizing over what will happen or what might happen if he goes into surgery for his shoulder.  Clinician explored actual medical advice, noting that Rodney Friedman has not been in for x-rays or other scans.  Clinician discussed the importance of allowing the anxiety to wait until the bad thing actually happens.  Clinician explored ways to incorporate more enjoyable activities into his daily life.  Rodney Friedman shared that he does not have much bandwidth for extra fun, however he does get relief and feels satisfied after completing household tasks.  Rodney Friedman shared regular socialization with friends.  He also continues to see his son once a week.  Plan: Return again in 2 weeks.  Diagnosis: Major depression, recurrent, chronic (HCC)  Collaboration of Care: Psychiatrist AEB discussed options for seeing Dr. Donell Beers at [facility] for earlier appointment times.  Patient/Guardian was advised Release of Information must be obtained prior to any record release in order to collaborate their care with an outside provider. Patient/Guardian was advised if they  have not already done so to contact the registration department to sign all necessary forms in order for [place] to release information regarding their care.   Consent: Patient/Guardian gives verbal consent for treatment and assignment of benefits for services provided during this visit. Patient/Guardian expressed understanding and agreed to proceed.   Rodney Heck Hato Candal, LCSW 07/03/2023

## 2023-07-06 ENCOUNTER — Encounter: Payer: Self-pay | Admitting: Gastroenterology

## 2023-07-06 ENCOUNTER — Ambulatory Visit: Payer: Medicare Other | Admitting: Gastroenterology

## 2023-07-06 VITALS — BP 154/94 | HR 80 | Ht 68.0 in | Wt 206.0 lb

## 2023-07-06 DIAGNOSIS — Z860100 Personal history of colon polyps, unspecified: Secondary | ICD-10-CM | POA: Diagnosis not present

## 2023-07-06 DIAGNOSIS — Z7902 Long term (current) use of antithrombotics/antiplatelets: Secondary | ICD-10-CM | POA: Diagnosis not present

## 2023-07-06 NOTE — Progress Notes (Signed)
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 Rodney Friedman had a CVA August 2024, and that his most recent cardiology visit with Dr. Royann Shivers December 2024, expressed concerns about holding oral anticoagulation for surveillance colonoscopy.  ___________  Rodney Friedman wanted to see me today to discuss colon polyp surveillance and his concerns about undergoing a colonoscopy at this point. He had a CVA in August of last year that occurred while on Coumadin, so he was switched to Eliquis afterward.  He denies abdominal pain, change in bowel habits, rectal bleeding, dysphagia vomiting or weight loss. Speech and mobility have recovered somewhat but still impaired to a degree.  He also does not have much family support in this area for a care partner during the procedure.  His greatest concern is a hold of the anticoagulation for any procedure.  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 Clifton James & Dr. Laneta Simmers   Sleep apnea    Stroke [facility])    Substance abuse (HCC)    Tremor, essential 09/22/2015     Past Surgical History: Past Surgical History:  Procedure Laterality Date   Arm Surgery Right    from door accident with glass   CARDIAC CATHETERIZATION     CATARACT EXTRACTION W/ INTRAOCULAR LENS IMPLANT Bilateral    COLONOSCOPY     CORONARY ARTERY BYPASS GRAFT  08/04/2004   EYE SURGERY     POLYPECTOMY     RIGHT/LEFT HEART CATH AND CORONARY/GRAFT ANGIOGRAPHY N/A 01/20/2021   Procedure: RIGHT/LEFT HEART CATH AND CORONARY/GRAFT ANGIOGRAPHY;  Surgeon: Kathleene Hazel, MD;  Location: [facility] INVASIVE CV LAB;  Service: Cardiovascular;  Laterality: N/A;   TEE WITHOUT CARDIOVERSION N/A 02/08/2021   Procedure: TRANSESOPHAGEAL ECHOCARDIOGRAM (TEE);  Surgeon: Kathleene Hazel, MD;  Location: [facility] INVASIVE CV LAB;  Service: Open Heart Surgery;  Laterality: N/A;  TRANSCATHETER AORTIC VALVE REPLACEMENT, CAROTID Left 02/08/2021   Procedure: TRANSCATHETER AORTIC VALVE REPLACEMENT, LEFT CAROTID;  Surgeon: Kathleene Hazel, MD;  Location: [facility] INVASIVE CV LAB;  Service: Open Heart Surgery;  Laterality: Left;   From Dr.Croitoru's December 2024 office note: Rodney Friedman is a 78 y.o. male with CAD and remote CABG (three-vessel, March 2006; LIMA to LAD , SVG to diagonal and SVG to distal OM, all patent by coronary angiography 01/20/2021), longstanding persistent atrial fibrillation, aortic stenosis s/p TAVR (26 mm  Edwards SAPIEN 3 ultra THV September 2022), severe mitral annular calcification with mitral regurgitation (presumably ischemic mechanism), persistent atrial fibrillation, carotid artery stenosis, hypertension, hyperlipidemia and type 2 diabetes mellitus, mild centrilobular emphysema.  He presented with an acute lacunar left thalamic infarct and a left sublingual cortical infarct, presumably embolic in August 2024.  He was switched from warfarin to Eliquis at that time.   Family History: Family History  Problem Relation Age of Onset   Colon cancer Mother    Ovarian cancer Mother    Uterine cancer Mother    Hypertension Father    Prostate cancer Father    Drug abuse Daughter    Colon polyps Neg Hx     Social History: Social History   Socioeconomic History   Marital status: Divorced    Spouse name: Not on file   Number of children: 2   Years of education: 16   Highest education level: Master's degree (e.g., MA, MS, MEng, MEd, MSW, MBA)  Occupational History   Occupation: Retired Runner, broadcasting/film/video  Tobacco Use   Smoking status: Former    Current packs/day: 0.00    Average packs/day: 0.8 packs/day for 59.0 years (44.3 ttl pk-yrs)    Types: Cigarettes    Start date: 12/16/1961    Quit date: 12/16/2020    Years since quitting: 2.5    Passive exposure: Past   Smokeless tobacco: Never  Vaping Use   Vaping status: Never Used  Substance and Sexual Activity   Alcohol use: No    Comment: none since march 3,2002   Drug use: No   Sexual activity: Not Currently  Other Topics Concern   Not on file  Social History Narrative   Lives alone   Divorced   Right-handed   Education: College   No caffeine   Social Drivers of Health   Financial Resource Strain: Medium Risk (06/07/2023)   Overall Financial Resource Strain (CARDIA)    Difficulty of Paying Living Expenses: Somewhat hard  Food Insecurity: No Food Insecurity (06/07/2023)   Hunger Vital Sign    Worried About Running Out of Food in the  Last Year: Never true    Ran Out of Food in the Last Year: Never true  Transportation Needs: No Transportation Needs (06/22/2023)   PRAPARE - Administrator, Civil Service (Medical): No    Lack of Transportation (Non-Medical): No  Recent Concern: Transportation Needs - Unmet Transportation Needs (06/07/2023)   PRAPARE - Transportation    Lack of Transportation (Medical): Yes    Lack of Transportation (Non-Medical): Yes  Physical Activity: Insufficiently Active (06/07/2023)   Exercise Vital Sign    Days of Exercise per Week: 4 days    Minutes of Exercise per Session: 30 min  Stress: No Stress Concern Present (06/07/2023)   [employer] of Occupational Health - Occupational Stress Questionnaire    Feeling of Stress : Only a little  Recent Concern: Stress - Stress Concern Present (04/22/2023)   [employer]  of Occupational Health - Occupational Stress Questionnaire    Feeling of Stress : Very much  Social Connections: Moderately Integrated (06/07/2023)   Social Connection and Isolation Panel [NHANES]    Frequency of Communication with Friends and Family: Three times a week    Frequency of Social Gatherings with Friends and Family: More than three times a week    Attends Religious Services: More than 4 times per year    Active Member of [employer] or Organizations: Yes    Attends Engineer, structural: More than 4 times per year    Marital Status: Divorced    Allergies: Allergies  Allergen Reactions   Other Rash    Allergen: "Plants and bushes while doing yard work"   [employer] Nausea And Vomiting   Poison Ivy Extract Rash    Outpatient Meds: Current Outpatient Medications  Medication Sig Dispense Refill   acetaminophen (TYLENOL) 325 MG tablet Take 1-2 tablets (325-650 mg total) by mouth every 4 (four) hours as needed for mild pain.     apixaban (ELIQUIS) 5 MG TABS tablet Take 1 tablet (5 mg total) by mouth 2 (two) times daily. 180 tablet 3   buPROPion  (WELLBUTRIN SR) 200 MG 12 hr tablet Take 1 tablet (200 mg total) by mouth every morning. 90 tablet 2   buPROPion ER (WELLBUTRIN SR) 100 MG 12 hr tablet 1 qhs 90 tablet 4   cyanocobalamin (VITAMIN B12) 500 MCG tablet Take 500 mcg by mouth every other day.     docusate sodium (COLACE) 100 MG capsule Take 1 capsule (100 mg total) by mouth 2 (two) times daily.     ezetimibe (ZETIA) 10 MG tablet Take 1 tablet (10 mg total) by mouth daily. 90 tablet 3   gabapentin (NEURONTIN) 300 MG capsule Take 1 capsule (300 mg total) by mouth at bedtime.     latanoprost (XALATAN) 0.005 % ophthalmic solution Place 1 drop into the left eye every other day.     metFORMIN (GLUCOPHAGE) 500 MG tablet Take 500 mg by mouth 2 (two) times daily with a meal.     metoprolol succinate (TOPROL-XL) 50 MG 24 hr tablet Take 1 tablet (50 mg total) by mouth 2 (two) times daily. Take with or immediately following a meal. 180 tablet 0   rosuvastatin (CRESTOR) 20 MG tablet Take 1 tablet (20 mg total) by mouth daily. 90 tablet 3   amoxicillin (AMOXIL) 500 MG tablet Take 4 tablets (2000 mg) by mouth ONE HOUR before any dental procedures. (Patient not taking: Reported on 07/06/2023) 12 tablet 4   No current facility-administered medications for this visit.      ___________________________________________________________________ Objective   Exam:  BP (!) 154/94 (BP Location: Left Arm, Patient Position: Sitting, Cuff Size: Normal)   Pulse 80 Comment: irregular  Ht 5\' 8"  (1.727 m) Comment: height measured without shoes  Wt 206 lb (93.4 kg)   BMI 31.32 kg/m  Wt Readings from Last 3 Encounters:  07/06/23 206 lb (93.4 kg)  06/11/23 216 lb 0.8 oz (98 kg)  05/07/23 202 lb (91.6 kg)    General: Pleasant and conversational, speech somewhat slow Eyes: sclera anicteric, no redness ENT: oral mucosa moist without lesions, no cervical or supraclavicular lymphadenopathy CV: Irregular rhythm, soft systolic murmur, no JVD, no peripheral  edema Resp: clear to auscultation bilaterally, normal RR and effort noted GI: soft, no tenderness, with active bowel sounds. No guarding or palpable organomegaly noted. Neuro: Tremulous, gets on exam table slowly with assistance  Encounter Diagnoses  Name Primary?   Hx of colonic polyps Yes   Long term (current) use of antithrombotics/antiplatelets    His health has significantly declined since I last saw him, most notably due to the CVA 6 months ago.  He has totally understandable concerns about not just the prep and logistics of the procedure, but about the risks of even a brief hold of oral anticoagulation.  I reassured him he has had a relatively low risk colon polyp history, and that he most likely will not develop colorectal cancer in his lifetime.  As such, I think the risks outweigh the expected benefits of a colonoscopy for him.  He was reassured and glad to hear that, and will contact me if something arises for which he needs advice.   Thank you for the courtesy of this consult.  Please call me with any questions or concerns.  Charlie Pitter III  CC: Referring provider noted above

## 2023-07-13 ENCOUNTER — Other Ambulatory Visit: Payer: Self-pay | Admitting: Nurse Practitioner

## 2023-07-13 DIAGNOSIS — E119 Type 2 diabetes mellitus without complications: Secondary | ICD-10-CM

## 2023-07-17 ENCOUNTER — Encounter (HOSPITAL_COMMUNITY): Payer: Self-pay | Admitting: Licensed Clinical Social Worker

## 2023-07-17 ENCOUNTER — Ambulatory Visit (INDEPENDENT_AMBULATORY_CARE_PROVIDER_SITE_OTHER): Payer: 59 | Admitting: Licensed Clinical Social Worker

## 2023-07-17 DIAGNOSIS — F339 Major depressive disorder, recurrent, unspecified: Secondary | ICD-10-CM

## 2023-07-17 DIAGNOSIS — F411 Generalized anxiety disorder: Secondary | ICD-10-CM

## 2023-07-17 NOTE — Progress Notes (Signed)
   THERAPIST PROGRESS NOTE  Session Time: 8:05-9:00am  Participation Level: Active  Behavioral Response: NeatAlertAnxious  Type of Therapy: Individual Therapy  Treatment Goals addressed: "to cope with stress and manage depression and anxiety". Rodney Friedman will report improved stress management and coping skills, resulting in mood stability and less worrying 4 out of 7 days.    ProgressTowards Goals: Progressing  Interventions: CBT  Summary: Rodney Friedman is a 78 y.o. male who presents with MDD, recurrent, chronic, and GAD.   Suicidal/Homicidal: Nowith intent/plan  Therapist Response: Rodney Friedman engaged well in individual and person session with clinician.  Clinician utilized CBT to process thoughts feelings and behaviors.  Rodney Friedman shared the saga of getting his driver's license renewed.  He immediately identified plans to start worrying about renewing it in 5 years.  Clinician challenged anxious thoughts, noting that they lead frequently to increased depression.  Clinician discussed the importance of mindfulness, being present in the here and now, and being able to choose what he wants to think about.  Clinician utilized CBT to assist in practicing thought stopping.  Clinician demonstrated ways to catch and challenge unhelpful thoughts, and changed him to more helpful thoughts.  Clinician discussed family relationships and increased closeness with daughter.  Plan: Return again in 2-3 weeks.  Diagnosis: Major depression, recurrent, chronic (HCC)  GAD (generalized anxiety disorder)  Collaboration of Care: Medication Management AEB notified Dr. Donell Beers about increased anxiety and need for attention regarding medication.  Patient/Guardian was advised Release of Information must be obtained prior to any record release in order to collaborate their care with an outside provider. Patient/Guardian was advised if they have not already done so to contact the registration department to sign all  necessary forms in order for [place] to release information regarding their care.   Consent: Patient/Guardian gives verbal consent for treatment and assignment of benefits for services provided during this visit. Patient/Guardian expressed understanding and agreed to proceed.   Chryl Heck Benton, LCSW 07/17/2023

## 2023-07-27 ENCOUNTER — Encounter: Payer: Self-pay | Admitting: Nurse Practitioner

## 2023-07-27 ENCOUNTER — Ambulatory Visit (INDEPENDENT_AMBULATORY_CARE_PROVIDER_SITE_OTHER): Payer: Self-pay | Admitting: Nurse Practitioner

## 2023-07-27 VITALS — BP 114/90 | HR 89 | Temp 97.9°F | Wt 201.6 lb

## 2023-07-27 DIAGNOSIS — M25512 Pain in left shoulder: Secondary | ICD-10-CM

## 2023-07-27 DIAGNOSIS — I482 Chronic atrial fibrillation, unspecified: Secondary | ICD-10-CM

## 2023-07-27 DIAGNOSIS — E1169 Type 2 diabetes mellitus with other specified complication: Secondary | ICD-10-CM | POA: Diagnosis not present

## 2023-07-27 DIAGNOSIS — I10 Essential (primary) hypertension: Secondary | ICD-10-CM

## 2023-07-27 DIAGNOSIS — E782 Mixed hyperlipidemia: Secondary | ICD-10-CM

## 2023-07-27 DIAGNOSIS — G8929 Other chronic pain: Secondary | ICD-10-CM

## 2023-07-27 DIAGNOSIS — E119 Type 2 diabetes mellitus without complications: Secondary | ICD-10-CM | POA: Diagnosis not present

## 2023-07-27 DIAGNOSIS — M25511 Pain in right shoulder: Secondary | ICD-10-CM

## 2023-07-27 LAB — POCT GLYCOSYLATED HEMOGLOBIN (HGB A1C): Hemoglobin A1C: 6.6 % — AB (ref 4.0–5.6)

## 2023-07-27 MED ORDER — BUPROPION HCL ER (SR) 200 MG PO TB12: 200.0000 mg | ORAL_TABLET | Freq: Every morning | ORAL | 2 refills | Status: DC

## 2023-07-27 MED ORDER — ROSUVASTATIN CALCIUM 20 MG PO TABS
20.0000 mg | ORAL_TABLET | Freq: Every day | ORAL | 3 refills | Status: AC
Start: 2023-07-27 — End: ?

## 2023-07-27 MED ORDER — EZETIMIBE 10 MG PO TABS: 10.0000 mg | ORAL_TABLET | Freq: Every day | ORAL | 3 refills | Status: AC

## 2023-07-27 MED ORDER — GABAPENTIN 300 MG PO CAPS: 300.0000 mg | ORAL_CAPSULE | Freq: Every day | ORAL | 0 refills | Status: DC

## 2023-07-27 MED ORDER — METFORMIN HCL 500 MG PO TABS: 500.0000 mg | ORAL_TABLET | Freq: Two times a day (BID) | ORAL | 1 refills | Status: DC

## 2023-07-27 MED ORDER — APIXABAN 5 MG PO TABS: 5.0000 mg | ORAL_TABLET | Freq: Two times a day (BID) | ORAL | 3 refills | Status: DC

## 2023-07-27 MED ORDER — BUPROPION HCL ER (SR) 100 MG PO TB12: ORAL_TABLET | ORAL | 4 refills | Status: DC

## 2023-07-27 MED ORDER — DOCUSATE SODIUM 100 MG PO CAPS: 100.0000 mg | ORAL_CAPSULE | Freq: Two times a day (BID) | ORAL | Status: AC

## 2023-07-27 MED ORDER — CYANOCOBALAMIN 500 MCG PO TABS: 500.0000 ug | ORAL_TABLET | ORAL | 2 refills | Status: AC

## 2023-07-27 MED ORDER — METOPROLOL SUCCINATE ER 50 MG PO TB24: 50.0000 mg | ORAL_TABLET | Freq: Two times a day (BID) | ORAL | 0 refills | Status: DC

## 2023-07-27 NOTE — Patient Instructions (Signed)
 1. Type 2 diabetes mellitus with other specified complication, without long-term current use of insulin (HCC) (Primary)  - POCT glycosylated hemoglobin (Hb A1C)  2. Type 2 diabetes mellitus without complication, without long-term current use of insulin (HCC)  - POCT glycosylated hemoglobin (Hb A1C)  Follow up:  Follow up in 3 months

## 2023-07-27 NOTE — Progress Notes (Signed)
 Subjective   Patient ID: Rodney Friedman, male    DOB: [birth year], 78 y.o.   MRN: [id number]  Chief Complaint  Patient presents with   Diabetes    Per patient he do not check his blood sugars at home    Referring provider: Ivonne [name], NP  Rodney Friedman is a 78 y.o. 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 Pt says it causes               abnormal EKGs No date: HLD (hyperlipidemia) No date: Hypertension No date: Leukemia [facility])     Comment:  CLL 2006: Myocardial infarction (HCC) No date: Osteopenia No date: Osteoporosis 07/08/2013: Prostate cancer (HCC)     Comment:  [facility]- getting Lupron shot every 6               months - traces in prostate and 2 lymphnodes left hip-               non focused traces per pt  02/08/2021: S/P TAVR (transcatheter aortic valve replacement)     Comment:  s/p TAVR with a 23mm Edwards S3U via the TF approach by               Dr. Clifton James & Dr. Laneta Simmers No date: Sleep apnea No date: Stroke [facility]) No date: Substance abuse (HCC) 09/22/2015: Tremor, essential   HPI  Patient presents today for routine follow-up visit. Overall he has been stable since his last visit here. Patient is following with cardiology and Coumadin clinic. He is following  with urology to monitor elevated PSA level. He has followed with oncology. Patient does have a history of smoking but no longer smokes. A1C in office today is 6.9. has started prolia.Denies f/c/s, n/v/d, hemoptysis, PND, leg swelling. Denies chest pain or edema.    Note: saw GI, but declined colon cancer screen.     Allergies  Allergen Reactions   Other Rash    Allergen: "Plants and bushes while doing yard work"   [employer] Nausea And Vomiting   Poison Ivy Extract Rash    Immunization History  Administered Date(s) Administered   Fluad Quad(high Dose 65+) 01/22/2019, 01/25/2023   Influenza Split 02/17/2015   Influenza, High Dose Seasonal PF 02/15/2018, 01/22/2020, 01/23/2022   Influenza, Quadrivalent, Recombinant, Inj, Pf 01/22/2019   Influenza,inj,Quad PF,6+ Mos 02/11/2016, 02/08/2017   Influenza-Unspecified 02/17/2015, 01/22/2019, 01/23/2022   Moderna Sars-Covid-2 Vaccination 08/20/2019, 09/17/2019, 03/25/2020   Pneumococcal Conjugate-13 02/24/2014   Pneumococcal Polysaccharide-23 09/08/2015   Tdap 09/08/2015   Unspecified SARS-COV-2 Vaccination 08/20/2019   Zoster Recombinant(Shingrix) 02/23/2022, 04/26/2022   Zoster, Live 05/22/2013    Tobacco History: Social History   Tobacco Use  Smoking Status Former   Current packs/day: 0.00   Average packs/day: 0.8 packs/day for 59.0 years (44.3 ttl pk-yrs)   Types: Cigarettes  Start date: 12/16/1961   Quit date: 12/16/2020   Years since quitting: 2.6   Passive exposure: Past  Smokeless Tobacco Never   Counseling given: Not Answered   Outpatient Encounter Medications as of 07/27/2023  Medication Sig   acetaminophen (TYLENOL) 325 MG tablet Take 1-2 tablets (325-650 mg total) by mouth every 4 (four) hours as needed for mild pain.   amoxicillin (AMOXIL) 500 MG tablet Take 4 tablets (2000 mg) by mouth ONE HOUR before any dental procedures.   latanoprost (XALATAN) 0.005 % ophthalmic solution Place 1 drop into the left eye every  other day.   [DISCONTINUED] apixaban (ELIQUIS) 5 MG TABS tablet Take 1 tablet (5 mg total) by mouth 2 (two) times daily.   [DISCONTINUED] buPROPion (WELLBUTRIN SR) 200 MG 12 hr tablet Take 1 tablet (200 mg total) by mouth every morning.   [DISCONTINUED] buPROPion ER (WELLBUTRIN SR) 100 MG 12 hr tablet 1 qhs   [DISCONTINUED] cyanocobalamin (VITAMIN B12) 500 MCG tablet Take 500 mcg by mouth every other day.   [DISCONTINUED] docusate sodium (COLACE) 100 MG capsule Take 1 capsule (100 mg total) by mouth 2 (two) times daily.   [DISCONTINUED] ezetimibe (ZETIA) 10 MG tablet Take 1 tablet (10 mg total) by mouth daily.   [DISCONTINUED] gabapentin (NEURONTIN) 300 MG capsule Take 1 capsule (300 mg total) by mouth at bedtime.   [DISCONTINUED] metFORMIN (GLUCOPHAGE) 500 MG tablet TAKE 1 TABLET BY MOUTH 2 TIMES DAILY WITH A MEAL.   [DISCONTINUED] metoprolol succinate (TOPROL-XL) 50 MG 24 hr tablet Take 1 tablet (50 mg total) by mouth 2 (two) times daily. Take with or immediately following a meal.   [DISCONTINUED] rosuvastatin (CRESTOR) 20 MG tablet Take 1 tablet (20 mg total) by mouth daily.   apixaban (ELIQUIS) 5 MG TABS tablet Take 1 tablet (5 mg total) by mouth 2 (two) times daily.   buPROPion (WELLBUTRIN SR) 200 MG 12 hr tablet Take 1 tablet (200 mg total) by mouth every morning.   buPROPion ER (WELLBUTRIN SR) 100 MG 12 hr tablet 1 qhs   cyanocobalamin (VITAMIN B12) 500 MCG tablet Take 1 tablet (500 mcg total) by mouth every other day.   docusate sodium (COLACE) 100 MG capsule Take 1 capsule (100 mg total) by mouth 2 (two) times daily.   ezetimibe (ZETIA) 10 MG tablet Take 1 tablet (10 mg total) by mouth daily.   gabapentin (NEURONTIN) 300 MG capsule Take 1 capsule (300 mg total) by mouth at bedtime.   metFORMIN (GLUCOPHAGE) 500 MG tablet Take 1 tablet (500 mg total) by mouth 2 (two) times daily with a meal.   metoprolol succinate (TOPROL-XL) 50 MG 24 hr tablet Take 1 tablet (50 mg total) by mouth 2 (two)  times daily. Take with or immediately following a meal.   rosuvastatin (CRESTOR) 20 MG tablet Take 1 tablet (20 mg total) by mouth daily.   No facility-administered encounter medications on file as of 07/27/2023.    Review of Systems  Review of Systems  Constitutional: Negative.   HENT: Negative.    Cardiovascular: Negative.   Gastrointestinal: Negative.   Allergic/Immunologic: Negative.   Neurological: Negative.   Psychiatric/Behavioral: Negative.       Objective:   BP (!) 114/90   Pulse 89   Temp 97.9 F (36.6 C) (Oral)   Wt 201 lb 9.6 oz (91.4 kg)   SpO2 99%   BMI 30.65 kg/m   Wt Readings from Last 5 Encounters:  07/27/23 201 lb 9.6 oz (91.4 kg)  07/06/23  206 lb (93.4 kg)  06/11/23 216 lb 0.8 oz (98 kg)  05/07/23 202 lb (91.6 kg)  04/26/23 208 lb (94.3 kg)     Physical Exam Vitals and nursing note reviewed.  Constitutional:      General: He is not in acute distress.    Appearance: He is well-developed.  Cardiovascular:     Rate and Rhythm: Normal rate and regular rhythm.  Pulmonary:     Effort: Pulmonary effort is normal.     Breath sounds: Normal breath sounds.  Skin:    General: Skin is warm and dry.  Neurological:     Mental Status: He is alert and oriented to person, place, and time.       Assessment & Plan:   Type 2 diabetes mellitus with other specified complication, without long-term current use of insulin (HCC) -     POCT glycosylated hemoglobin (Hb A1C)  Type 2 diabetes mellitus without complication, without long-term current use of insulin (HCC) -     POCT glycosylated hemoglobin (Hb A1C)  Chronic atrial fibrillation (HCC) -     Apixaban; Take 1 tablet (5 mg total) by mouth 2 (two) times daily.  Dispense: 180 tablet; Refill: 3  Type 2 diabetes mellitus without complications (HCC) -     metFORMIN HCl; Take 1 tablet (500 mg total) by mouth 2 (two) times daily with a meal.  Dispense: 180 tablet; Refill: 1  Essential hypertension -      Metoprolol Succinate ER; Take 1 tablet (50 mg total) by mouth 2 (two) times daily. Take with or immediately following a meal.  Dispense: 180 tablet; Refill: 0  Mixed hyperlipidemia -     Rosuvastatin Calcium; Take 1 tablet (20 mg total) by mouth daily.  Dispense: 90 tablet; Refill: 3  Chronic pain of both shoulders -     Ambulatory referral to Physical Therapy  Other orders -     Cyanocobalamin; Take 1 tablet (500 mcg total) by mouth every other day.  Dispense: 60 tablet; Refill: 2 -     buPROPion HCl ER (SR); Take 1 tablet (200 mg total) by mouth every morning.  Dispense: 90 tablet; Refill: 2 -     buPROPion HCl ER (SR); 1 qhs  Dispense: 90 tablet; Refill: 4 -     Docusate Sodium; Take 1 capsule (100 mg total) by mouth 2 (two) times daily. -     Ezetimibe; Take 1 tablet (10 mg total) by mouth daily.  Dispense: 90 tablet; Refill: 3 -     Gabapentin; Take 1 capsule (300 mg total) by mouth at bedtime.  Dispense: 30 capsule; Refill: 0     Return in about 3 months (around 10/27/2023).   Ivonne [name], NP 07/27/2023

## 2023-07-31 ENCOUNTER — Encounter: Payer: Self-pay | Admitting: Hematology and Oncology

## 2023-07-31 ENCOUNTER — Inpatient Hospital Stay: Payer: Medicare Other | Attending: Internal Medicine

## 2023-07-31 ENCOUNTER — Inpatient Hospital Stay (HOSPITAL_BASED_OUTPATIENT_CLINIC_OR_DEPARTMENT_OTHER): Payer: Medicare Other | Admitting: Hematology and Oncology

## 2023-07-31 ENCOUNTER — Ambulatory Visit: Payer: Medicare Other | Admitting: Hematology and Oncology

## 2023-07-31 ENCOUNTER — Other Ambulatory Visit: Payer: Medicare Other

## 2023-07-31 VITALS — BP 155/99 | HR 81 | Temp 97.9°F | Resp 18 | Ht 68.0 in | Wt 201.2 lb

## 2023-07-31 DIAGNOSIS — C61 Malignant neoplasm of prostate: Secondary | ICD-10-CM | POA: Insufficient documentation

## 2023-07-31 DIAGNOSIS — C9110 Chronic lymphocytic leukemia of B-cell type not having achieved remission: Secondary | ICD-10-CM

## 2023-07-31 LAB — CBC WITH DIFFERENTIAL/PLATELET
Abs Immature Granulocytes: 0.06 10*3/uL (ref 0.00–0.07)
Basophils Absolute: 0.1 10*3/uL (ref 0.0–0.1)
Basophils Relative: 1 %
Eosinophils Absolute: 0.2 10*3/uL (ref 0.0–0.5)
Eosinophils Relative: 1 %
HCT: 46 % (ref 39.0–52.0)
Hemoglobin: 15.9 g/dL (ref 13.0–17.0)
Immature Granulocytes: 0 %
Lymphocytes Relative: 45 %
Lymphs Abs: 6.7 10*3/uL — ABNORMAL HIGH (ref 0.7–4.0)
MCH: 31.2 pg (ref 26.0–34.0)
MCHC: 34.6 g/dL (ref 30.0–36.0)
MCV: 90.4 fL (ref 80.0–100.0)
Monocytes Absolute: 1 10*3/uL (ref 0.1–1.0)
Monocytes Relative: 6 %
Neutro Abs: 7 10*3/uL (ref 1.7–7.7)
Neutrophils Relative %: 47 %
Platelets: 206 10*3/uL (ref 150–400)
RBC: 5.09 MIL/uL (ref 4.22–5.81)
RDW: 12.8 % (ref 11.5–15.5)
Smear Review: NORMAL
WBC: 14.9 10*3/uL — ABNORMAL HIGH (ref 4.0–10.5)
nRBC: 0 % (ref 0.0–0.2)

## 2023-07-31 NOTE — Assessment & Plan Note (Addendum)
 He has diagnosis of stage II prostate cancer and is on hormonal manipulation He will continue treatment as directed by his urologist

## 2023-07-31 NOTE — Progress Notes (Signed)
 [facility] OFFICE PROGRESS NOTE  Patient Care Team: Ivonne [name], NP as PCP - General (General Practice) Croitoru, Rachelle Hora, MD as PCP - Cardiology (Cardiology) Croitoru, Rachelle Hora, MD as Consulting Physician (Cardiology) Alleen Borne, MD as Consulting Physician (Cardiothoracic Surgery) Crist Fat, MD as Attending Physician (Urology) Archer Asa, MD as Consulting Physician (Psychiatry) Martina Sinner, MD as Consulting Physician (Pulmonary Disease) Holli Humbles, MD as Referring Physician (Ophthalmology) Eber Jones, MD as Referring Physician (Ophthalmology) Artis Delay, MD as Consulting Physician (Hematology and Oncology) Veneda Melter, LCSW as Counselor (Licensed Clinical Social Worker) Colletta Maryland, RN as [facility]  Assessment & Plan CLL (chronic lymphocytic leukemia) [name]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    Artis Delay, MD  INTERVAL HISTORY: he returns for surveillance follow-up for CLL He is doing well He has bilateral shoulder arthritis pain Denies recent infection no new lymphadenopathy We discussed test results and future plan of care as outlined above I reviewed CBC result with the patient  PHYSICAL EXAMINATION: ECOG PERFORMANCE STATUS: 0 - Asymptomatic  Vitals:   07/31/23 1039  BP: (!) 155/99  Pulse: 81  Resp: 18  Temp: 97.9 F (36.6 C)  SpO2: 100%   Filed Weights   07/31/23 1039  Weight: 201 lb 3.2 oz (91.3 kg)

## 2023-07-31 NOTE — Assessment & Plan Note (Addendum)
 He was diagnosed with CLL since 2015, normal FISH panel, stage I disease He is not on treatment and is on active surveillance He is not symptomatic and have no signs of clinical progression I will continue to see him once a year

## 2023-08-02 ENCOUNTER — Ambulatory Visit: Payer: Self-pay

## 2023-08-02 DIAGNOSIS — I119 Hypertensive heart disease without heart failure: Secondary | ICD-10-CM

## 2023-08-02 DIAGNOSIS — I482 Chronic atrial fibrillation, unspecified: Secondary | ICD-10-CM

## 2023-08-02 NOTE — Patient Instructions (Signed)
 Visit Information  Thank you for taking time to visit with me today. Please don't hesitate to contact me if I can be of assistance to you.   Following are the goals we discussed today:  Obtain a BP monitor and begin to check and record BP readings. Contact your insurance company to see if they will cover a blood pressure monitor. Other options: your local pharmacy or [place] outpatient pharmacy or order on Amazon Continue to take medications as prescribed. Continue to attend provider visits as scheduled Continue to eat healthy, lean meats, vegetables, fruits, avoid saturated and transfats Contact provider with health questions or concerns as needed  Our next appointment is by telephone on 09/19/23 at 10:00 am.  Please call the care guide team at [phone] if you need to cancel or reschedule your appointment.   If you are experiencing a Mental Health or Behavioral Health Crisis or need someone to talk to, please call the Suicide and Crisis Lifeline: 988 call the [place] National Suicide Prevention Lifeline: 620 807 4927 or TTY: 726-177-5477 TTY 364-799-9031) to talk to a trained counselor   Kathyrn Sheriff, RN, MSN, BSN, CCM [place]  [facility], Population Health Case Manager Phone: [phone]   How to Take Your Blood Pressure Blood pressure measures how strongly your blood is pressing against the walls of your arteries. Arteries are blood vessels that carry blood from your heart throughout your body. You can take your blood pressure at home with a machine. You may need to check your blood pressure at home: To check if you have high blood pressure (hypertension). To check your blood pressure over time. To make sure your blood pressure medicine is working. Supplies needed: Blood pressure machine, or monitor. A chair to sit in. This should be a chair where you can sit upright with your back supported. Do not sit on a soft couch or an armchair. Table or  desk. Small notebook. Pencil or pen. How to prepare Avoid these things for 30 minutes before checking your blood pressure: Having drinks with caffeine in them, such as coffee or tea. Drinking alcohol. Eating. Smoking. Exercising. Do these things five minutes before checking your blood pressure: Go to the bathroom and pee (urinate). Sit in a chair. Be quiet. Do not talk. How to take your blood pressure Follow the instructions that came with your machine. If you have a digital blood pressure monitor, these may be the instructions: Sit up straight. Place your feet on the floor. Do not cross your ankles or legs. Rest your left arm at the level of your heart. You may rest it on a table, desk, or chair. Pull up your shirt sleeve. Wrap the blood pressure cuff around the upper part of your left arm. The cuff should be 1 inch (2.5 cm) above your elbow. It is best to wrap the cuff around bare skin. Fit the cuff snugly around your arm, but not too tightly. You should be able to place only one finger between the cuff and your arm. Place the cord so that it rests in the bend of your elbow. Press the power button. Sit quietly while the cuff fills with air and loses air. Write down the numbers on the screen. Wait 2-3 minutes and then repeat steps 1-10. What do the numbers mean? Two numbers make up your blood pressure. The first number is called systolic pressure. The second is called diastolic pressure. An example of a blood pressure reading is "120 over 80" (or 120/80). If  you are an adult and do not have a medical condition, use this guide to find out if your blood pressure is normal: Normal First number: below 120. Second number: below 80. Elevated First number: 120-129. Second number: below 80. Hypertension stage 1 First number: 130-139. Second number: 80-89. Hypertension stage 2 First number: 140 or above. Second number: 90 or above. Your blood pressure is above normal even if only  the first or only the second number is above normal. Follow these instructions at home: Medicines Take over-the-counter and prescription medicines only as told by your doctor. Tell your doctor if your medicine is causing side effects. General instructions Check your blood pressure as often as your doctor tells you to. Check your blood pressure at the same time every day. Take your monitor to your next doctor's appointment. Your doctor will: Make sure you are using it correctly. Make sure it is working right. Understand what your blood pressure numbers should be. Keep all follow-up visits. General tips You will need a blood pressure machine or monitor. Your doctor can suggest a monitor. You can buy one at a drugstore or online. When choosing one: Choose one with an arm cuff. Choose one that wraps around your upper arm. Only one finger should fit between your arm and the cuff. Do not choose one that measures your blood pressure from your wrist or finger. Where to find more information American Heart Association: www.heart.org Contact a doctor if: Your blood pressure keeps being high. Your blood pressure is suddenly low. Get help right away if: Your first blood pressure number is higher than 180. Your second blood pressure number is higher than 120. These symptoms may be an emergency. Do not wait to see if the symptoms will go away. Get help right away. Call 911. Summary Check your blood pressure at the same time every day. Avoid caffeine, alcohol, smoking, and exercise for 30 minutes before checking your blood pressure. Make sure you understand what your blood pressure numbers should be. This information is not intended to replace advice given to you by your health care provider. Make sure you discuss any questions you have with your health care provider. Document Revised: 01/20/2021 Document Reviewed: 01/20/2021 Elsevier Patient Education  2024 [employer].

## 2023-08-02 NOTE — Patient Outreach (Signed)
 Care Coordination   Follow Up Visit Note   08/02/2023 Name: Rodney Friedman MRN: [id number] DOB: [birth year]  Rodney Friedman is a 78 y.o. year old male who sees Ivonne [name], NP for primary care. I spoke with  Rodney Friedman by phone today.  What matters to the patients health and wellness today?  Patient reports follow up office visit with PCP on 07/27/23. Rodney Friedman with history of diabetes. He states he does not check BS. PCP aware. However, he is taking metformin as prescribed. He reports latest A1C 6.6 on 07/27/23. He reports referred to outpatient rehab for chronic bilateral shoulder pain. Per review of chart, patient noted to have consistent elevated BP at office visits: PCP visit on 07/27/23 114/90. Office visit with oncology 07/31/23 BP 155/99. Patient states he was rushing to get to the appointment. 07/06/23 BP 164/94. Last cardiology visit on 05/07/23 BP 149/80-per cardiology instructions, patient was to check home readings and follow up with cardiologist, which he states he did not do. He reports he cannot find his blood pressure monitor and will need a new one. He also reports expenses as a barrier, adding he is paying over $200 for Eliquis. Patient is agreeable to clinical pharmacy referral for disease management and assistance. Rodney Friedman states he will look into getting a blood pressure monitor.  Goals Addressed             This Visit's Progress    Maintain and/or improve health       Interventions Today    Flowsheet Row Most Recent Value  Chronic Disease   Chronic disease during today's visit Hypertension (HTN), Atrial Fibrillation (AFib), Diabetes  General Interventions   General Interventions Discussed/Reviewed General Interventions Reviewed, Doctor Visits  [Evaluation of current treatment plan for health condition and patient's adherence to plan.]  Doctor Visits Discussed/Reviewed PCP, Specialist  PCP/Specialist Visits Compliance with follow-up  visit  [reviewed upcoming/scheduled appointments]  Education Interventions   Education Provided Provided Education  [reviewed BP readings noted at office visit with patient and discussed the importance of controlling BP, emphasized the importance of checking BP at home and following up with providers as instructed.]  Provided Verbal Education On Blood Sugar Monitoring, Medication, When to see the doctor, [employer], Labs  [advised to contact insurance to see if he has an OTC benefit. discussed [facility] or local pharmacy or amaz[employer]]  Labs Reviewed Hgb A1c  [discussed latest A1C 07/27/23 6.6]  Mental Health Interventions   Mental Health Discussed/Reviewed Mental Health Discussed  [encouraged to continue to follow up with providers as scheduled]  Nutrition Interventions   Nutrition Discussed/Reviewed Nutrition Reviewed, Decreasing salt  Pharmacy Interventions   Pharmacy Dicussed/Reviewed Pharmacy Topics Reviewed, Affording Medications, Referral to Pharmacist  Referral to Pharmacist --  [out of pocket cost for patient around $200, hinders what he able to afford for other expenses.]            SDOH assessments and interventions completed:  No  Care Coordination Interventions:  No, not indicated   Follow up plan: Follow up call scheduled for 08/19/23    Encounter Outcome:  Patient Visit Completed   Kathyrn Sheriff, RN, MSN, BSN, CCM [place]  [facility], Population Health Case Manager Phone: [phone]

## 2023-08-03 ENCOUNTER — Telehealth: Payer: Self-pay | Admitting: *Deleted

## 2023-08-03 NOTE — Progress Notes (Signed)
 Complex Care Management Note Care Guide Note  08/03/2023 Name: Rodney Friedman MRN: [id number] DOB: 04/26/1946   Complex Care Management Outreach Attempts: An unsuccessful telephone outreach was attempted today to offer the patient information about available complex care management services.  Follow Up Plan:  Additional outreach attempts will be made to offer the patient complex care management information and services.   Encounter Outcome:  No Answer  Gwenevere Ghazi  [facility]  [facility], [facility] Guide  Direct Dial: [phone]  Fax [phone]

## 2023-08-03 NOTE — Progress Notes (Signed)
 Complex Care Management Note Care Guide Note  08/03/2023 Name: Alva Broxson MRN: [id number] DOB: 04/26/1946   Complex Care Management Outreach Attempts: An unsuccessful telephone outreach was attempted today to offer the patient information about available complex care management services.  Follow Up Plan:  Additional outreach attempts will be made to offer the patient complex care management information and services.   Encounter Outcome:  No Answer  Gwenevere Ghazi  [facility]  [facility], [facility] Guide  Direct Dial: [phone]  Fax [phone]

## 2023-08-06 NOTE — Progress Notes (Unsigned)
{  CARE GUIDE [id number]}

## 2023-08-07 ENCOUNTER — Ambulatory Visit (INDEPENDENT_AMBULATORY_CARE_PROVIDER_SITE_OTHER): Payer: Medicare Other | Admitting: Licensed Clinical Social Worker

## 2023-08-07 ENCOUNTER — Encounter (HOSPITAL_COMMUNITY): Payer: Self-pay | Admitting: Licensed Clinical Social Worker

## 2023-08-07 DIAGNOSIS — F339 Major depressive disorder, recurrent, unspecified: Secondary | ICD-10-CM

## 2023-08-07 NOTE — Progress Notes (Signed)
   THERAPIST PROGRESS NOTE  Session Time: 8:00am-8:55am  Participation Level: Active  Behavioral Response: NeatAlertEuthymic  Type of Therapy: Individual Therapy  Treatment Goals addressed: "to cope with stress and manage depression and anxiety". Rodney Friedman will report improved stress management and coping skills, resulting in mood stability and less worrying 4 out of 7 days.    ProgressTowards Goals: Progressing  Interventions: CBT  Summary: Rodney Friedman is a 78 y.o. male who presents with MDD, recurrent, chronic.   Suicidal/Homicidal: Nowithout intent/plan  Therapist Response: Rodney Friedman engaged well in individual and personal session with Facilities manager.  Clinician utilized CBT to process thoughts feelings and interactions.  Clinician explored updates with medical health and emotional health.  Rodney Friedman shared hesitation and negative energy around going for different tests and physical therapy.  Clinician identified the importance of going in to these appointments with a more positive and curious attitude, rather than a negative belief that they are things won't help.  Clinician identified the importance of catching and challenging those thoughts as they arise in order to change them.  Rodney Friedman shared updates about an upcoming family visit in June, which will be wonderful.  Clinician processed feelings about anniversary of father's death.  Clinician noted that the support he received from his father has been passed down from Rodney Friedman to his children.  Plan: Return again in 3 weeks.  Diagnosis: Major depression, recurrent, chronic (HCC)  Collaboration of Care: Psychiatrist AEB provided updates to Dr. Donell Beers  Patient/Guardian was advised Release of Information must be obtained prior to any record release in order to collaborate their care with an outside provider. Patient/Guardian was advised if they have not already done so to contact the registration department to sign all necessary forms in order for  [place] to release information regarding their care.   Consent: Patient/Guardian gives verbal consent for treatment and assignment of benefits for services provided during this visit. Patient/Guardian expressed understanding and agreed to proceed.   Chryl Heck [place], LCSW 08/07/2023

## 2023-08-08 ENCOUNTER — Other Ambulatory Visit: Payer: Self-pay

## 2023-08-08 ENCOUNTER — Telehealth: Payer: Self-pay | Admitting: Nurse Practitioner

## 2023-08-08 ENCOUNTER — Ambulatory Visit: Attending: Nurse Practitioner

## 2023-08-08 DIAGNOSIS — M25511 Pain in right shoulder: Secondary | ICD-10-CM | POA: Diagnosis present

## 2023-08-08 DIAGNOSIS — R293 Abnormal posture: Secondary | ICD-10-CM

## 2023-08-08 DIAGNOSIS — G8929 Other chronic pain: Secondary | ICD-10-CM

## 2023-08-08 DIAGNOSIS — M6281 Muscle weakness (generalized): Secondary | ICD-10-CM

## 2023-08-08 DIAGNOSIS — M25512 Pain in left shoulder: Secondary | ICD-10-CM | POA: Insufficient documentation

## 2023-08-08 NOTE — Therapy (Signed)
 OUTPATIENT PHYSICAL THERAPY SHOULDER EVALUATION   Patient Name: Rodney Friedman MRN: [id number] DOB:[birth year], 78 y.o., male Today's Date: 08/08/2023  END OF SESSION:  PT End of Session - 08/08/23 1250     Visit Number 1    Number of Visits 17    Date for PT Re-Evaluation 10/03/23    Authorization Type Medicare    PT Start Time 0800    PT Stop Time 0845    PT Time Calculation (min) 45 min    Activity Tolerance Patient tolerated treatment well    Behavior During Therapy [facility] for tasks assessed/performed             Past Medical History:  Diagnosis Date   Adenomatous colon polyp    Alcoholism (HCC)    Anxiety    Atrial fibrillation (HCC)    CAD (coronary artery disease)    Cataract    removed left eye    CHF (congestive heart failure) (HCC) 12/16/2020   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 Clifton James & Dr. Laneta Simmers   Sleep apnea    Stroke [facility])    Substance abuse (HCC)    Tremor, essential 09/22/2015   Past Surgical History:  Procedure Laterality Date   Arm Surgery Right    from door accident with glass   CARDIAC CATHETERIZATION     CATARACT EXTRACTION W/ INTRAOCULAR LENS IMPLANT Bilateral     COLONOSCOPY     CORONARY ARTERY BYPASS GRAFT  08/04/2004   EYE SURGERY     POLYPECTOMY     RIGHT/LEFT HEART CATH AND CORONARY/GRAFT ANGIOGRAPHY N/A 01/20/2021   Procedure: RIGHT/LEFT HEART CATH AND CORONARY/GRAFT ANGIOGRAPHY;  Surgeon: Kathleene Hazel, MD;  Location: [facility] INVASIVE CV LAB;  Service: Cardiovascular;  Laterality: N/A;   TEE WITHOUT CARDIOVERSION N/A 02/08/2021   Procedure: TRANSESOPHAGEAL ECHOCARDIOGRAM (TEE);  Surgeon: Kathleene Hazel, MD;  Location: [facility] INVASIVE CV LAB;  Service: Open Heart Surgery;  Laterality: N/A;   TRANSCATHETER AORTIC VALVE REPLACEMENT, CAROTID Left 02/08/2021   Procedure: TRANSCATHETER AORTIC VALVE REPLACEMENT, LEFT CAROTID;  Surgeon: Kathleene Hazel, MD;  Location: [facility] INVASIVE CV LAB;  Service: Open Heart Surgery;  Laterality: Left;   Patient Active Problem List   Diagnosis Date Noted   Acute pain of right knee 06/11/2023   Fall 06/11/2023   Need for influenza vaccination 01/25/2023   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   Acute CVA (cerebrovascular accident) (HCC) 01/03/2023   Acute ischemic stroke (HCC) 01/02/2023   Diverticulosis 10/12/2022   Health care maintenance 02/23/2022   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   Chronic pain 03/14/2019   Essential hypertension 07/27/2018   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   Diabetes mellitus (HCC) 04/21/2016   OSA (obstructive sleep apnea) 04/21/2016   Quality of life palliative care encounter 01/03/2016   Tremor, essential 09/22/2015   Glaucoma, open angle 06/16/2015   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   Pseudophakia 06/15/2015   CD (contact dermatitis) 04/29/2014   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   Hematuria 01/05/2014   Skin lesion 01/05/2014   Severe aortic stenosis 01/03/2014   Hypertensive heart disease 01/03/2014   Mixed hyperlipidemia 01/03/2014   Mild obesity 01/03/2014   Chronic atrial fibrillation  (HCC) 12/31/2013   Long term (current) use of anticoagulants 12/31/2013   Cataract, nuclear 10/06/2013   Dermatochalasis of eyelid 10/06/2013   Chorioretinal scar, 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/03/2013   Pre-diabetes 09/03/2013   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   Prostate cancer (HCC) 07/08/2013    PCP: Ivonne [name], NP  REFERRING PROVIDER: Ivonne [name], NP  REFERRING DIAG: M25.511,G89.29,M25.512 (ICD-10-CM) - Chronic pain of both shoulders   THERAPY DIAG:  Chronic right shoulder pain  Muscle weakness (generalized)  Abnormal posture  Rationale for Evaluation and Treatment: Rehabilitation  ONSET DATE: Chronic  SUBJECTIVE:                                                                                                                                                                                      SUBJECTIVE STATEMENT: Pt presents to PT with reports of chronic bilateral shoulder pain, R>L. Denies N/T down either UE or weakness post CVA from 2024. Does have some neurologic tremors after his CVA but notes that this does not affect his function. He lives alone and does not feel comfortable driving, is worried about having a possible shoulder surgery because he will have no one to help him. He does employ a home health aide who helps take him to appointments.  Hand dominance: Right  PERTINENT HISTORY: CVA, DM II, HTN  PAIN:  Are you having pain?  Yes: NPRS scale: 2/10 Worst: 6/10 Pain location: anterior shoulder R>L Pain description: sharp, clicking  Aggravating factors: reaching OH, lifting Relieving factors: none  PRECAUTIONS: Fall  RED FLAGS: None   WEIGHT BEARING RESTRICTIONS: No  FALLS:  Has patient fallen in last 6 months? Yes. Number of falls 3 - mechanical falls while at home  LIVING ENVIRONMENT: Lives with: lives alone Lives in:  House/apartment  OCCUPATION: Retired  PLOF: Independent and lives alone  PATIENT GOALS: Pt wants to decrease shoulder pain  and discomfort in order to improve comfort with home ADLs and yard work  NEXT MD VISIT: Not currently scheduled  OBJECTIVE:  Note: Objective measures were completed at Evaluation unless otherwise noted.  DIAGNOSTIC FINDINGS:  None  PATIENT SURVEYS:  Quick DASH: 34% disability  COGNITION: Overall cognitive status: Within functional limits for tasks assessed     SENSATION: WFL  POSTURE: Rounded shoulders, forward head, R AC joint piano key  UPPER EXTREMITY ROM:   Active ROM Right eval Left eval  Shoulder flexion 88 108  Shoulder extension    Shoulder abduction 85 120  Shoulder adduction    Shoulder internal rotation L5 L2  Shoulder external rotation 40 30  Elbow flexion    Elbow extension    Wrist flexion    Wrist extension    Wrist ulnar deviation    Wrist radial deviation    Wrist pronation    Wrist supination    (Blank rows = not tested)  UPPER EXTREMITY MMT:  MMT Right eval Left eval  Shoulder flexion    Shoulder extension    Shoulder abduction    Shoulder adduction    Shoulder internal rotation 4/5 4/5  Shoulder external rotation 3+/5 4/5  Middle trapezius    Lower trapezius    Elbow flexion    Elbow extension    Wrist flexion    Wrist extension    Wrist ulnar deviation    Wrist radial deviation    Wrist pronation    Wrist supination    Grip strength (lbs)    (Blank rows = not tested)  SHOULDER SPECIAL TESTS: Impingement tests: Painful arc test: positive  SLAP lesions: DNT Instability tests: DNT Rotator cuff assessment: DNT Biceps assessment: DNT  PALPATION:  TTP to R infraspinatus                                                                                                                           TREATMENT: [facility] Adult PT Treatment:                                                  Therapeutic  Exercise: R shoulder IR/ER isometric x 5 - 5" hold Seated bilateral ER x 10 YTB Seated scapular retraction x 5  PATIENT EDUCATION: Education details: eval findings, Quick DASH, HEP, POC Person educated: Patient Education method: Explanation, Demonstration, and Handouts Education comprehension: verbalized understanding and returned demonstration  HOME EXERCISE PROGRAM: Access Code: [email or web address] URL: [email or web address] Date: 08/08/2023 Prepared by: Edwinna Areola  Exercises - Standing Isometric Shoulder External Rotation with Doorway and Towel Roll  - 1-2 x daily - 7 x weekly - 3 sets - 10 reps - 5 sec hold - Standing Isometric Shoulder Internal Rotation with Towel Roll at Doorway  - 1-2 x daily - 7 x weekly - 3 sets -  10 reps - 5 sec hold - Shoulder External Rotation and Scapular Retraction with Resistance  - 1-2 x daily - 7 x weekly - 3 sets - 10 reps - yellow band hold - Seated Scapular Retraction  - 1-2 x daily - 7 x weekly - 3 sets - 10 reps - 3 sec hold  ASSESSMENT:  CLINICAL IMPRESSION: Patient is a 78 y.o. m who was seen today for physical therapy evaluation and treatment for chronic bilateral shoulder pain R>L. Physical findings are consistent with MD impression as pt demonstrates decrease in bilateral shoulder strength and ROM. Quick DASH score demonstrates moderate disability in performance of home ADLs and community activities. Pt would benefit from skilled PT services working on improving RTC and periscapular strength in order to improve dynamic stability and decrease shoulder pain.   OBJECTIVE IMPAIRMENTS: decreased activity tolerance, decreased balance, decreased endurance, difficulty walking, decreased ROM, decreased strength, impaired UE functional use, postural dysfunction, and pain   ACTIVITY LIMITATIONS: carrying, lifting, standing, squatting, stairs, transfers, reach over head, and hygiene/grooming  PARTICIPATION LIMITATIONS: meal prep, cleaning, driving,  community activity, occupation, and yard work  PERSONAL FACTORS: Time since onset of injury/illness/exacerbation and 3+ comorbidities: CVA, DM II, HTN  are also affecting patient's functional outcome.   REHAB POTENTIAL: Good  CLINICAL DECISION MAKING: Stable/uncomplicated  EVALUATION COMPLEXITY: Moderate   GOALS: Goals reviewed with patient? No  SHORT TERM GOALS: Target date: 08/29/2023   Pt will be compliant and knowledgeable with initial HEP for improved comfort and carryover Baseline: initial HEP given  Goal status: INITIAL  2.  Pt will self report bilateral shoulder pain no greater than 4/10 for improved comfort and functional ability Baseline: 6/10 at worst Goal status: INITIAL   LONG TERM GOALS: Target date: 10/03/2023   Pt will decrease Quick DASH disability score to no greater than 20% as proxy for functional improvement Baseline: 34% disability  Goal status: INITIAL  2.  Pt will self report right shoulder pain no greater than 1-2/10 for improved comfort and functional ability Baseline: 6/10 at worst Goal status: INITIAL   3.  Pt will improve bilateral shoulder flexion ROM to no less than 130 degrees for improved functional ability reaching into cabinets and other ADLs Baseline: see ROM chart Goal status: INITIAL  4.  Pt will improve bilateral shoulder IR/ER MMT to no less than 5/5 for improved dynamic stability and decrease pain Baseline: check MMT chart Goal status: INITIAL  5.  Pt will improve bilateral IR to at least T10 for improved ability to perform self care and hygiene activities  Baseline: see ROM chart Goal status: INITIAL  PLAN:  PT FREQUENCY: 1x/week  PT DURATION: 8 weeks  PLANNED INTERVENTIONS: 97164- PT Re-evaluation, 97110-Therapeutic exercises, 97530- Therapeutic activity, [id number]- Neuromuscular re-education, 97535- Self Care, 60454- Manual therapy, G0283- Electrical stimulation (unattended), [id number]- Electrical stimulation (manual), 97016-  Vasopneumatic device, Dry Needling, Cryotherapy, and Moist heat  PLAN FOR NEXT SESSION: assess HEP response, RTC and periscapular strengthening   Eloy End, PT 08/08/2023, 12:50 PM

## 2023-08-08 NOTE — Telephone Encounter (Signed)
 Patient requesting:  glucose supplies with machine/pt never had this, so doesn't have the name of it

## 2023-08-08 NOTE — Telephone Encounter (Addendum)
 Copied from CRM [id number]. Topic: Clinical - Medication Refill >> Aug 08, 2023  9:26 AM Turkey B wrote: Most Recent Primary Care Visit:  Provider: Ivonne [name]  Department: [facility]  Visit Type: OFFICE VISIT  Date: 07/27/2023  Medication: glucose supplies with machine/pt never had this, so doesn't have the name of it  Has the patient contacted their pharmacy? no Pt went and bout supplies out of his pocket from [employer],but he is going to send that back and go thru his insurance  Is this the correct pharmacy for this prescription?   This is the patient's preferred pharmacy:  [id number] [place], [place] - [address] [place] [address] Phone: [phone] Fax: [phone]     Has the prescription been filled recently? no  Is the patient out of the medication? yes  Has the patient been seen for an appointment in the last year OR does the patient have an upcoming appointment? yes  Can we respond through MyChart? yes  Agent: Please be advised that Rx refills may take up to 3 business days. We ask that you follow-up with your pharmacy.

## 2023-08-09 ENCOUNTER — Other Ambulatory Visit: Payer: Self-pay

## 2023-08-09 ENCOUNTER — Telehealth: Payer: Self-pay

## 2023-08-09 DIAGNOSIS — I482 Chronic atrial fibrillation, unspecified: Secondary | ICD-10-CM

## 2023-08-09 DIAGNOSIS — I119 Hypertensive heart disease without heart failure: Secondary | ICD-10-CM

## 2023-08-09 DIAGNOSIS — E1169 Type 2 diabetes mellitus with other specified complication: Secondary | ICD-10-CM

## 2023-08-09 NOTE — Progress Notes (Signed)
 08/09/2023 Name: Rodney Friedman MRN: [id number] DOB: November 13, 1945  Chief Complaint  Patient presents with   Medication Assistance   Medication Adherence    Rodney Friedman is a 78 y.o. year old male who presented for a telephone visit.   They were referred to the pharmacist by their PCP for assistance in obtaining medication access. However, patient declined services. Patient was outreached to gather more information on insurance plan as a CMA requested information help with order diabetic testing supplies. This led to several requests from the patient during this encounter.    Subjective:  Rodney Friedman has PMH significant for DM, CVA (12/2022), HTN, and atrial fibrillation who is also followed by cardiology.    Care Team: Primary Care Provider: Ivonne [name], NP ; Next Scheduled Visit: 11/05/2023   Medication Access/Adherence  Current Pharmacy:  [id number] [address] [place] [address] Phone: [phone] Fax: [phone]  [facility] [address] [place] [address] Phone: [phone] Fax: [phone]   Patient reports affordability concerns with their medications: Yes  Patient reports access/transportation concerns to their pharmacy: No  Patient reports adherence concerns with their medications:  Yes    Patient declined pharmacist help from clinic yet nurse asked for assistance in helping to order diabetic supplies and Rodney Friedman wanted a blood pressure cuff. He does not have OTC benefits card as he does not have Medicare Advantage. He states that he declines blood pressure machine prescription to be sent to [facility] due to past instance that he experienced with [facility] hospital/community pharmacy. Eventually, he states he declined clinical pharmacist initial outreach d/t concerns related to the community pharmacy. He states that he is able to afford a blood  pressure machine if its ~ $30.     Eliquis - he does not qualify for PAP per patient report in September 2024. Per reported income today, he does not qualify and no other options.   Diabetes:  Current medications: Metformin 500 mg twice daily   He does not check his blood glucose since he has been diagnosed.    Patient denies hypoglycemic s/sx including dizziness, shakiness, sweating. Patient denies hyperglycemic symptoms including polyuria, polydipsia, polyphagia, nocturia, neuropathy, blurred vision. He has incontinence at baseline from radiation, per patient.   Current meal patterns: (eats mainly chicken and salmon as primary source of protein)  - Breakfast: eggs, cereal, oatmeal (No sausage, bacon) - Lunch PB&J, cereal bar, half sandwich, Premier protein drink  - Supper veggie, macaroni (daily) - Snacks reduced ice cream, "addicted" to [employer] chocolate/ nuggets/ brownies - Drinks coffee, unsweetened tea, bottled water, Premier protein, diet coke/Pepsi, OJ (on occasion) -- Happily reports being sober 23 years from EtOH and drugs. This pharmacist congratulated patient on achievement.  Current physical activity: will start physical therapy soon   Hypertension:  Current medications: Metoprolol XL 50 mg BID  Medications previously tried:   Patient does not have a validated, automated, upper arm home BP cuff  Current blood pressure readings readings: 155/99  Patient denies hypotensive s/sx including dizziness, lightheadedness.    Current meal patterns: he does not add salt on foods and read the sodium content  Current physical activity: N/A  Objective:  Lab Results  Component Value Date   HGBA1C 6.6 (A) 07/27/2023    Lab Results  Component Value Date   CREATININE 1.20 04/26/2023   BUN 10 04/26/2023   NA 146 (H) 04/26/2023  K 4.6 04/26/2023   CL 108 (H) 04/26/2023   CO2 22 04/26/2023    Lab Results  Component Value Date   CHOL 88 01/03/2023   HDL 32 (L)  01/03/2023   LDLCALC 28 01/03/2023   TRIG 140 01/03/2023   CHOLHDL 2.8 01/03/2023    Medications Reviewed Today     Reviewed by Katha Cabal, RPH (Pharmacist) on 08/09/23 at 1242  Med List Status: <None>   Medication Order Taking? Sig Documenting Provider Last Dose Status Informant  acetaminophen (TYLENOL) 325 MG tablet [id number] Yes Take 1-2 tablets (325-650 mg total) by mouth every 4 (four) hours as needed for mild pain. Setzer, Lynnell Jude, PA-C Taking Active   apixaban (ELIQUIS) 5 MG TABS tablet [id number] Yes Take 1 tablet (5 mg total) by mouth 2 (two) times daily. Ivonne [name], NP Taking Active   buPROPion [facility] SR) 200 MG 12 hr tablet [id number] Yes Take 1 tablet (200 mg total) by mouth every morning. Ivonne [name], NP Taking Active   buPROPion ER [facility] SR) 100 MG 12 hr tablet [id number] Yes 1 qhs Ivonne [name], NP Taking Active            Med Note Para March, [facility] R   Thu Aug 09, 2023 12:36 PM) Takes one tablet every night  calcium carbonate (TUMS - DOSED IN MG ELEMENTAL CALCIUM) 500 MG chewable tablet [id number] Yes Chew 1 tablet by mouth 2 (two) times daily. [provider] Taking Active            Med Note Para March, [facility] R   Thu Aug 09, 2023 12:36 PM) Taking prn   cyanocobalamin (VITAMIN B12) 500 MCG tablet [id number] Yes Take 1 tablet (500 mcg total) by mouth every other day. Ivonne [name], NP Taking Active   Denosumab [facility]) [id number] Yes  [provider] Taking Active   docusate sodium (COLACE) 100 MG capsule [id number] Yes Take 1 capsule (100 mg total) by mouth 2 (two) times daily. Ivonne [name], NP Taking Active   ezetimibe (ZETIA) 10 MG tablet [id number] Yes Take 1 tablet (10 mg total) by mouth daily. Ivonne [name], NP Taking Active   gabapentin (NEURONTIN) 300 MG capsule [id number] Yes Take 1 capsule (300 mg total) by mouth at bedtime. Ivonne [name], NP Taking Active   latanoprost (XALATAN) 0.005 % ophthalmic solution  [id number] Yes Place 1 drop into the left eye every other day. [provider] Taking Active Self           Med Note Kandis Cocking Alinda Dooms A   Wed Jan 03, 2023  2:36 PM) Patient states that he needs a refill.  magnesium oxide (MAG-OX) 400 (240 Mg) MG tablet [id number] Yes Take 400 mg by mouth daily. [provider] Taking Active   metFORMIN (GLUCOPHAGE) 500 MG tablet [id number] Yes Take 1 tablet (500 mg total) by mouth 2 (two) times daily with a meal. Ivonne [name], NP Taking Active   metoprolol succinate (TOPROL-XL) 50 MG 24 hr tablet [id number] Yes Take 1 tablet (50 mg total) by mouth 2 (two) times daily. Take with or immediately following a meal. Ivonne [name], NP Taking Active   rosuvastatin (CRESTOR) 20 MG tablet [id number] Yes Take 1 tablet (20 mg total) by mouth daily. Ivonne [name], NP Taking Active               Assessment/Plan:   Diabetes: - Currently controlled - Reviewed long term cardiovascular and renal outcomes of  uncontrolled blood sugar - Reviewed goal A1c, goal fasting, and goal 2 hour post prandial glucose - Reviewed dietary modifications including discussed eating chocolate candy in moderation - Reviewed lifestyle modifications including: encouraged patient to work on physical therapy exercises when he start therapy - Recommend to continue metformin 500 mg twice daily  - Recommend to check glucose once daily (fasting) or twice daily (fasting and postprandial). Will review and educate patient on how to use glucometer when he receives device.  - Updated nurse on how glucometer order    Hypertension: - Currently uncontrolled. Metoprolol Succinate 50 mg twice daily, although patient was on metoprolol tartrate previously. Last office visit BP 155/99. He does not check his blood pressure due to lack of machine at home. He states that he should be able to afford a blood pressure machine if it is < $30. [facility]  Informed patient to get the upper arm cuff BP Device.  - Reviewed long term cardiovascular and renal outcomes of uncontrolled blood pressure - Reviewed appropriate blood pressure monitoring technique and reviewed goal blood pressure. Recommended to check home blood pressure and heart rate at least once daily - Recommend future considerations for beta blockers with alpha blocking activity such as carvedilol to help with BP control, s/p CVA, rate control - when patient has more BP readings available. Will verify metoprolol succinate dosing frequency with cardiologist.  - Will need to follow up on blood pressure cuff and education. Patient plans to check the cost of blood pressure device and purchase.   Medication Access:  - Per discussion with the patient and chart review, it appears he was denied medication assistance for Eliquis last year. The patient is unsure whether he is enrolled in the Medication Prescription Payment Plan. I will follow up with the insurance benefits team to verify his enrollment in the program. If the patient is interested in enrolling to reduce his monthly copay, I will assist him with the application process. - Medication refills needed: Metoprolol (clarifying frequency) and gabapentin (will defer for PCP).   Follow Up Plan: Next PCP appointment -- 11/05/2023; Pharmacist: 08/14/2023  Time spent: 1hr 8 min Cephus Shelling, PharmD Clinical Pharmacist Cell: [phone]

## 2023-08-10 ENCOUNTER — Telehealth: Payer: Self-pay

## 2023-08-10 ENCOUNTER — Other Ambulatory Visit: Payer: Self-pay

## 2023-08-10 DIAGNOSIS — E1169 Type 2 diabetes mellitus with other specified complication: Secondary | ICD-10-CM

## 2023-08-10 MED ORDER — LANCET DEVICE MISC: 1.0000 | Freq: Once | 0 refills | Status: DC

## 2023-08-10 MED ORDER — BLOOD GLUCOSE MONITORING SUPPL DEVI: 1.0000 | Freq: Once | 0 refills | Status: AC

## 2023-08-10 MED ORDER — BLOOD GLUCOSE MONITORING SUPPL DEVI: 1.0000 | Freq: Once | 0 refills | Status: DC

## 2023-08-10 MED ORDER — LANCETS MISC. MISC
1.0000 | Freq: Two times a day (BID) | 1 refills | Status: AC
Start: 2023-08-10 — End: 2023-09-29

## 2023-08-10 MED ORDER — LANCET DEVICE MISC: 1.0000 | Freq: Once | 0 refills | Status: AC

## 2023-08-10 MED ORDER — BLOOD GLUCOSE TEST VI STRP: 1.0000 | ORAL_STRIP | Freq: Two times a day (BID) | 0 refills | Status: AC

## 2023-08-10 NOTE — Patient Instructions (Addendum)
 Mr. Drees,   Check your blood pressure once daily, and any time you have concerning symptoms like headache, chest pain, dizziness, shortness of breath, or vision changes.   Our goal is less than 130/80.  To appropriately check your blood pressure, make sure you do the following:  1) Avoid caffeine, exercise, or tobacco products for 30 minutes before checking. Empty your bladder. 2) Sit with your back supported in a flat-backed chair. Rest your arm on something flat (arm of the chair, table, etc). 3) Sit still with your feet flat on the floor, resting, for at least 5 minutes.  4) Check your blood pressure. Take 1-2 readings.  5) Write down these readings and bring with you to any provider appointments.  Bring your home blood pressure machine with you to a provider's office for accuracy comparison at least once a year.   Make sure you take your blood pressure medications before you come to any office visit, even if you were asked to fast for labs.   Check your blood sugars twice daily:  1) Fasting, first thing in the morning before breakfast and  2) 2 hours after your largest meal.   For a goal A1c of less than 7%, goal fasting readings are less than 130 and goal 2 hour after meal readings are less than 180.    Thank you for allowing pharmacy to be a part of this patient's care. Cephus Shelling, PharmD Clinical Pharmacist Cell: [phone]

## 2023-08-10 NOTE — Progress Notes (Signed)
   08/10/2023  Patient ID: Rodney Friedman, male   DOB: 22-Dec-1945, 78 y.o.   MRN: [id number]  Care Coordination Call  I called [facility] to obtain insurance information for part D plan. The called his insurance to determine if patient enrolled in the Medication Prescription Payment Plan. Patient's payment is $348.44 for 90 days supply and he is NOT enrolled in the Medication Prescription Payment Plan. Will follow up next week to discuss next steps with patient.   Cephus Shelling, PharmD Clinical Pharmacist Cell: [phone]

## 2023-08-15 ENCOUNTER — Encounter: Payer: Self-pay | Admitting: Physical Therapy

## 2023-08-15 ENCOUNTER — Ambulatory Visit: Admitting: Physical Therapy

## 2023-08-15 DIAGNOSIS — R293 Abnormal posture: Secondary | ICD-10-CM

## 2023-08-15 DIAGNOSIS — M25511 Pain in right shoulder: Secondary | ICD-10-CM | POA: Diagnosis not present

## 2023-08-15 DIAGNOSIS — G8929 Other chronic pain: Secondary | ICD-10-CM

## 2023-08-15 DIAGNOSIS — M6281 Muscle weakness (generalized): Secondary | ICD-10-CM

## 2023-08-15 NOTE — Therapy (Addendum)
 OUTPATIENT PHYSICAL THERAPY TREATMENT   Patient Name: Rodney Friedman MRN: [id number] DOB:1945-10-13, 78 y.o., male Today's Date: 08/15/2023  END OF SESSION:  PT End of Session - 08/15/23 0935     Visit Number 2   Number of Visits 17    Date for PT Re-Evaluation 10/03/23    Authorization Type Medicare    PT Start Time 0933    PT Stop Time 1017    PT Time Calculation (min) 44 min    Activity Tolerance Patient tolerated treatment well    Behavior During Therapy 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 Abel Hoe & Dr. Sherene Dilling   Sleep apnea    Stroke [facility])    Substance abuse (HCC)    Tremor, essential 09/22/2015   Past Surgical History:  Procedure Laterality Date   Arm Surgery Right    from door accident with glass   CARDIAC CATHETERIZATION     CATARACT EXTRACTION W/ INTRAOCULAR LENS IMPLANT Bilateral     COLONOSCOPY     CORONARY ARTERY BYPASS GRAFT  08/04/2004   EYE SURGERY     POLYPECTOMY     RIGHT/LEFT HEART CATH AND CORONARY/GRAFT ANGIOGRAPHY N/A 01/20/2021   Procedure: RIGHT/LEFT HEART CATH AND CORONARY/GRAFT ANGIOGRAPHY;  Surgeon: Odie Benne, MD;  Location: [facility] INVASIVE CV LAB;  Service: Cardiovascular;  Laterality: N/A;   TEE WITHOUT CARDIOVERSION N/A 02/08/2021   Procedure: TRANSESOPHAGEAL ECHOCARDIOGRAM (TEE);  Surgeon: Odie Benne, MD;  Location: 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   Acute CVA (cerebrovascular accident) (HCC) 01/03/2023   Acute ischemic stroke (HCC) 01/02/2023   Diverticulosis 10/12/2022   Health care maintenance 02/23/2022   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  Chronic pain 03/14/2019   Essential hypertension 07/27/2018   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   Diabetes mellitus (HCC) 04/21/2016   OSA (obstructive sleep apnea) 04/21/2016   Quality of life palliative care encounter 01/03/2016   Tremor, essential 09/22/2015   Glaucoma, open angle 06/16/2015   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   Pseudophakia 06/15/2015   CD (contact dermatitis) 04/29/2014   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   Hematuria 01/05/2014   Skin lesion 01/05/2014   Severe aortic stenosis 01/03/2014   Hypertensive heart disease 01/03/2014   Mixed hyperlipidemia 01/03/2014   Mild obesity 01/03/2014   Chronic atrial fibrillation (HCC)  12/31/2013   Long term (current) use of anticoagulants 12/31/2013   Cataract, nuclear 10/06/2013   Dermatochalasis of eyelid 10/06/2013   Chorioretinal scar, 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/03/2013   Pre-diabetes 09/03/2013   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   Prostate cancer (HCC) 07/08/2013    PCP: Jerrlyn Morel, NP  REFERRING PROVIDER: Jerrlyn Morel, NP  REFERRING DIAG: M25.511,G89.29,M25.512 (ICD-10-CM) - Chronic pain of both shoulders   THERAPY DIAG:  Chronic right shoulder pain  Muscle weakness (generalized)  Abnormal posture  Rationale for Evaluation and Treatment: Rehabilitation  ONSET DATE: Chronic  SUBJECTIVE:                                                                                                                                                                                      SUBJECTIVE STATEMENT: 08/15/2023 "Clicking and popping and grinding, but no pain the shoulder."  PERTINENT HISTORY: CVA, DM II, HTN  PAIN:  Are you having pain?  Yes: NPRS scale: 0/10 Worst: 6/10 Pain location: anterior shoulder R>L Pain description: sharp, clicking  Aggravating factors: reaching OH, lifting Relieving factors: none  PRECAUTIONS: Fall  RED FLAGS: None   WEIGHT BEARING RESTRICTIONS: No  FALLS:  Has patient fallen in last 6 months? Yes. Number of falls 3 - mechanical falls while at home  LIVING ENVIRONMENT: Lives with: lives alone Lives in: House/apartment  OCCUPATION: Retired  PLOF: Independent and lives alone  PATIENT GOALS: Pt wants to decrease shoulder pain and discomfort in order to improve comfort with home ADLs and yard work  NEXT MD VISIT: Not currently scheduled  OBJECTIVE:  Note: Objective measures were completed at Evaluation unless otherwise noted.  DIAGNOSTIC FINDINGS:  None  PATIENT SURVEYS:  Quick DASH: 34%  disability  COGNITION: Overall cognitive status: Within functional limits for tasks assessed     SENSATION: WFL  POSTURE: Rounded shoulders, forward head, R AC joint piano key  UPPER EXTREMITY ROM:   Active ROM Right eval Left eval  Shoulder flexion 88 108  Shoulder extension    Shoulder abduction 85 120  Shoulder adduction    Shoulder internal rotation L5 L2  Shoulder external rotation 40 30  Elbow flexion    Elbow extension    Wrist flexion    Wrist extension    Wrist ulnar deviation    Wrist radial deviation    Wrist pronation    Wrist supination    (Blank rows = not tested)  UPPER EXTREMITY MMT:  MMT Right eval Left eval  Shoulder flexion    Shoulder extension    Shoulder abduction    Shoulder adduction    Shoulder internal rotation 4/5 4/5  Shoulder external rotation 3+/5 4/5  Middle trapezius    Lower trapezius    Elbow flexion    Elbow extension    Wrist flexion    Wrist extension    Wrist ulnar deviation    Wrist radial deviation    Wrist pronation    Wrist supination    Grip strength (lbs)    (Blank rows = not tested)  SHOULDER SPECIAL TESTS: Impingement tests: Painful arc test: positive  SLAP lesions: DNT Instability tests: DNT Rotator cuff assessment: DNT Biceps assessment: DNT  PALPATION:  TTP to R infraspinatus                                                                                                                           TREATMENT: [facility] Adult PT Treatment:                                                DATE: 08/15/23 Isometric IR/ER 1 x 10 holding 3 sec ea. Rows with RTB 2 x 12 - 2nd set tactile cues for scapular retraction.  Seated scaption RUE 2 x 10 1# Seated scapular retration with ER 2 x 10 with YTB - verbal cues for proper form Update HEP for overhead press    [facility] Adult PT Treatment:                                                  Therapeutic Exercise: R shoulder IR/ER isometric x 5 - 5" hold Seated bilateral  ER x 10 YTB Seated scapular retraction x 5  PATIENT EDUCATION: Education details: eval findings, Quick DASH, HEP, POC Person educated: Patient Education method: Explanation, Demonstration, and Handouts Education comprehension: verbalized understanding and returned demonstration  HOME EXERCISE PROGRAM: Access Code: [email or web address] URL: [email or web address] Date: 08/15/2023 Prepared by: Laron Plummer  Exercises - Standing Isometric Shoulder External Rotation with Doorway and Towel Roll  - 1-2 x daily - 7 x weekly -  3 sets - 10 reps - 5 sec hold - Standing Isometric Shoulder Internal Rotation with Towel Roll at Doorway  - 1-2 x daily - 7 x weekly - 3 sets - 10 reps - 5 sec hold - Shoulder External Rotation and Scapular Retraction with Resistance  - 1-2 x daily - 7 x weekly - 3 sets - 10 reps - yellow band hold - Seated Scapular Retraction  - 1-2 x daily - 7 x weekly - 3 sets - 10 reps - 3 sec hold - Seated Overhead Press  - 1 x daily - 7 x weekly - 2 sets - 10 reps  ASSESSMENT:  CLINICAL IMPRESSION: 08/15/2023 Rahiem arrives to PT today noting no pain, but does reporting continued popping, clicking and grinding in the shoulder. Continued focus on posterior chain and RTC strengthening which he did well requiring min verbal/ tactiles cues for proper form and activation. End of session he noted no pain and reported he felt like he got some work in.    Evaluation Patient is a 78 y.o. m who was seen today for physical therapy evaluation and treatment for chronic bilateral shoulder pain R>L. Physical findings are consistent with MD impression as pt demonstrates decrease in bilateral shoulder strength and ROM. Quick DASH score demonstrates moderate disability in performance of home ADLs and community activities. Pt would benefit from skilled PT services working on improving RTC and periscapular strength in order to improve dynamic stability and decrease shoulder pain.   OBJECTIVE  IMPAIRMENTS: decreased activity tolerance, decreased balance, decreased endurance, difficulty walking, decreased ROM, decreased strength, impaired UE functional use, postural dysfunction, and pain   ACTIVITY LIMITATIONS: carrying, lifting, standing, squatting, stairs, transfers, reach over head, and hygiene/grooming  PARTICIPATION LIMITATIONS: meal prep, cleaning, driving, community activity, occupation, and yard work  PERSONAL FACTORS: Time since onset of injury/illness/exacerbation and 3+ comorbidities: CVA, DM II, HTN  are also affecting patient's functional outcome.   REHAB POTENTIAL: Good  CLINICAL DECISION MAKING: Stable/uncomplicated  EVALUATION COMPLEXITY: Moderate   GOALS: Goals reviewed with patient? No  SHORT TERM GOALS: Target date: 08/29/2023   Pt will be compliant and knowledgeable with initial HEP for improved comfort and carryover Baseline: initial HEP given  Goal status: INITIAL  2.  Pt will self report bilateral shoulder pain no greater than 4/10 for improved comfort and functional ability Baseline: 6/10 at worst Goal status: INITIAL   LONG TERM GOALS: Target date: 10/03/2023   Pt will decrease Quick DASH disability score to no greater than 20% as proxy for functional improvement Baseline: 34% disability  Goal status: INITIAL  2.  Pt will self report right shoulder pain no greater than 1-2/10 for improved comfort and functional ability Baseline: 6/10 at worst Goal status: INITIAL   3.  Pt will improve bilateral shoulder flexion ROM to no less than 130 degrees for improved functional ability reaching into cabinets and other ADLs Baseline: see ROM chart Goal status: INITIAL  4.  Pt will improve bilateral shoulder IR/ER MMT to no less than 5/5 for improved dynamic stability and decrease pain Baseline: check MMT chart Goal status: INITIAL  5.  Pt will improve bilateral IR to at least T10 for improved ability to perform self care and hygiene activities   Baseline: see ROM chart Goal status: INITIAL  PLAN:  PT FREQUENCY: 1x/week  PT DURATION: 8 weeks  PLANNED INTERVENTIONS: [id number]- PT Re-evaluation, 97110-Therapeutic exercises, 97530- Therapeutic activity, [id number]- Neuromuscular re-education, 97535- Self Care, [id number]- Manual therapy, [id number]- Electrical stimulation (unattended),  95621- Electrical stimulation (manual), 30865- Vasopneumatic device, Dry Needling, Cryotherapy, and Moist heat  PLAN FOR NEXT SESSION: assess HEP response, RTC and periscapular strengthening. Schedule more visits next session.    Leanor Voris PT, DPT, LAT, ATC  08/15/23  10:27 AM    Laron Plummer PT, DPT, LAT, ATC  09/12/23  1:09 PM

## 2023-08-16 ENCOUNTER — Telehealth: Payer: Self-pay | Admitting: Physical Therapy

## 2023-08-16 NOTE — Telephone Encounter (Signed)
 Returned Mr D'Agostino's call. He was looking at this notes and wanted to clairfy based on the documentation listed under participation limitations that he could drive but if something where to occur or he got lost he wouldn't have anyone to call.   I informed him that information was entered at his evaluation. The participation limitations doesn't mean he is incapable of performing the task but is simply limited based on factors assessed/ obtained in the evaluation, and is utilized to assist with coding the complexity of the assessment.   He reported understanding and thanked me for calling him back, and would be at his next appt the following week.    Sharla Tankard PT, DPT, LAT, ATC  08/16/23  9:29 AM

## 2023-08-21 ENCOUNTER — Ambulatory Visit (HOSPITAL_COMMUNITY): Payer: Medicare Other | Admitting: Licensed Clinical Social Worker

## 2023-08-21 ENCOUNTER — Other Ambulatory Visit: Payer: Self-pay | Admitting: Nurse Practitioner

## 2023-08-22 ENCOUNTER — Ambulatory Visit: Attending: Nurse Practitioner | Admitting: Physical Therapy

## 2023-08-22 DIAGNOSIS — R293 Abnormal posture: Secondary | ICD-10-CM | POA: Insufficient documentation

## 2023-08-22 DIAGNOSIS — M6281 Muscle weakness (generalized): Secondary | ICD-10-CM | POA: Insufficient documentation

## 2023-08-22 DIAGNOSIS — M25511 Pain in right shoulder: Secondary | ICD-10-CM | POA: Diagnosis present

## 2023-08-22 DIAGNOSIS — G8929 Other chronic pain: Secondary | ICD-10-CM | POA: Insufficient documentation

## 2023-08-22 NOTE — Therapy (Addendum)
 OUTPATIENT PHYSICAL THERAPY TREATMENT   Patient Name: Rodney Friedman MRN: [id number] DOB:January 26, 1946, 78 y.o., male Today's Date: 08/22/2023  END OF SESSION:  PT End of Session - 08/22/23 0916     Visit Number 3   Number of Visits 17    Date for PT Re-Evaluation 10/03/23    Authorization Type Medicare    Progress Note Due on Visit 10    PT Start Time 0916    PT Stop Time 1002    PT Time Calculation (min) 46 min    Equipment Utilized During Treatment Gait belt    Activity Tolerance Patient tolerated treatment well    Behavior During Therapy WFL for tasks assessed/performed              Past Medical History:  Diagnosis Date   Adenomatous colon polyp    Alcoholism (HCC)    Anxiety    Atrial fibrillation (HCC)    CAD (coronary artery disease)    Cataract    removed left eye    CHF (congestive heart failure) (HCC) 12/16/2020   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 Abel Hoe & Dr. Sherene Dilling   Sleep apnea    Stroke [facility])    Substance abuse (HCC)    Tremor, essential 09/22/2015   Past Surgical History:  Procedure Laterality Date   Arm Surgery Right    from door accident with glass   CARDIAC  CATHETERIZATION     CATARACT EXTRACTION W/ INTRAOCULAR LENS IMPLANT Bilateral    COLONOSCOPY     CORONARY ARTERY BYPASS GRAFT  08/04/2004   EYE SURGERY     POLYPECTOMY     RIGHT/LEFT HEART CATH AND CORONARY/GRAFT ANGIOGRAPHY N/A 01/20/2021   Procedure: RIGHT/LEFT HEART CATH AND CORONARY/GRAFT ANGIOGRAPHY;  Surgeon: Odie Benne, MD;  Location: [facility] INVASIVE CV LAB;  Service: Cardiovascular;  Laterality: N/A;   TEE WITHOUT CARDIOVERSION N/A 02/08/2021   Procedure: TRANSESOPHAGEAL ECHOCARDIOGRAM (TEE);  Surgeon: Odie Benne, MD;  Location: [facility] INVASIVE CV LAB;  Service: Open Heart Surgery;  Laterality: N/A;   TRANSCATHETER AORTIC VALVE REPLACEMENT, CAROTID Left 02/08/2021   Procedure: TRANSCATHETER AORTIC VALVE REPLACEMENT, LEFT CAROTID;  Surgeon: Odie Benne, MD;  Location: [facility] INVASIVE CV LAB;  Service: Open Heart Surgery;  Laterality: Left;   Patient Active Problem List   Diagnosis Date Noted   Acute pain of right knee 06/11/2023   Fall 06/11/2023   Need for influenza vaccination 01/25/2023   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   Acute CVA (cerebrovascular accident) (HCC) 01/03/2023   Acute ischemic stroke (HCC) 01/02/2023  Diverticulosis 10/12/2022   Health care maintenance 02/23/2022   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   Chronic pain 03/14/2019   Essential hypertension 07/27/2018   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   Diabetes mellitus (HCC) 04/21/2016   OSA (obstructive sleep apnea) 04/21/2016   Quality of life palliative care encounter 01/03/2016   Tremor, essential 09/22/2015   Glaucoma, open angle 06/16/2015   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   Pseudophakia 06/15/2015   CD (contact dermatitis) 04/29/2014   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   Hematuria 01/05/2014   Skin lesion 01/05/2014   Severe aortic stenosis 01/03/2014   Hypertensive heart disease 01/03/2014   Mixed  hyperlipidemia 01/03/2014   Mild obesity 01/03/2014   Chronic atrial fibrillation (HCC) 12/31/2013   Long term (current) use of anticoagulants 12/31/2013   Cataract, nuclear 10/06/2013   Dermatochalasis of eyelid 10/06/2013   Chorioretinal scar, 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/03/2013   Pre-diabetes 09/03/2013   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   Prostate cancer (HCC) 07/08/2013    PCP: Jerrlyn Morel, NP  REFERRING PROVIDER: Jerrlyn Morel, NP  REFERRING DIAG: M25.511,G89.29,M25.512 (ICD-10-CM) - Chronic pain of both shoulders   THERAPY DIAG:  Chronic right shoulder pain  Muscle weakness (generalized)  Rationale for Evaluation and Treatment: Rehabilitation  ONSET DATE: Chronic  SUBJECTIVE:                                                                                                                                                                                      SUBJECTIVE STATEMENT: 08/22/2023 " I did something every day, moving the arms and raising them over head. The exercises have been helping."  PERTINENT HISTORY: CVA, DM II, HTN  PAIN:  Are you having pain?  Yes: NPRS scale: 0/10 Worst: 6/10 Pain location: anterior shoulder R>L Pain description: sharp, clicking  Aggravating factors: reaching OH, lifting Relieving factors: none  PRECAUTIONS: Fall  RED FLAGS: None   WEIGHT BEARING RESTRICTIONS: No  FALLS:  Has patient fallen in last 6 months? Yes. Number of falls 3 - mechanical falls while at home  LIVING ENVIRONMENT: Lives with: lives alone Lives in: House/apartment  OCCUPATION: Retired  PLOF: Independent and lives alone  PATIENT GOALS: Pt wants to decrease shoulder pain and discomfort in order to improve comfort with home ADLs and yard work  NEXT MD VISIT: Not currently scheduled  OBJECTIVE:  Note: Objective measures were completed at  Evaluation unless otherwise noted.  DIAGNOSTIC FINDINGS:  None  PATIENT SURVEYS:  Quick DASH: 34% disability  COGNITION: Overall cognitive  status: Within functional limits for tasks assessed     SENSATION: WFL  POSTURE: Rounded shoulders, forward head, R AC joint piano key  UPPER EXTREMITY ROM:   Active ROM Right eval Left eval  Shoulder flexion 88 108  Shoulder extension    Shoulder abduction 85 120  Shoulder adduction    Shoulder internal rotation L5 L2  Shoulder external rotation 40 30  Elbow flexion    Elbow extension    Wrist flexion    Wrist extension    Wrist ulnar deviation    Wrist radial deviation    Wrist pronation    Wrist supination    (Blank rows = not tested)  UPPER EXTREMITY MMT:  MMT Right eval Left eval  Shoulder flexion    Shoulder extension    Shoulder abduction    Shoulder adduction    Shoulder internal rotation 4/5 4/5  Shoulder external rotation 3+/5 4/5  Middle trapezius    Lower trapezius    Elbow flexion    Elbow extension    Wrist flexion    Wrist extension    Wrist ulnar deviation    Wrist radial deviation    Wrist pronation    Wrist supination    Grip strength (lbs)    (Blank rows = not tested)  SHOULDER SPECIAL TESTS: Impingement tests: Painful arc test: positive  SLAP lesions: DNT Instability tests: DNT Rotator cuff assessment: DNT Biceps assessment: DNT  PALPATION:  TTP to R infraspinatus                                                                                                                           TREATMENT: [facility] Adult PT Treatment:                                                DATE: 08/22/23 Standing rows 2 x 15 with GTB Standing shoulder extension 2 x 15 GTB Standing bil shoulder ER/IR 1 x 12 with GTB Seated scaption bil with 1# Overhead pressure with 1# bil, 2 x 10    [facility] Adult PT Treatment:                                                DATE: 08/15/23 Isometric IR/ER 1 x 10 holding 3 sec  ea. Rows with RTB 2 x 12 - 2nd set tactile cues for scapular retraction.  Seated scaption RUE 2 x 10 1# Seated scapular retration with ER 2 x 10 with YTB - verbal cues for proper form Update HEP for overhead press  [facility] Adult PT Treatment:  Therapeutic Exercise: R shoulder IR/ER isometric x 5 - 5" hold Seated bilateral ER x 10 YTB Seated scapular retraction x 5  PATIENT EDUCATION: Education details: eval findings, Quick DASH, HEP, POC Person educated: Patient Education method: Explanation, Demonstration, and Handouts Education comprehension: verbalized understanding and returned demonstration  HOME EXERCISE PROGRAM: Access Code: [email or web address] URL: [email or web address] Date: 08/22/2023 Prepared by: Laron Plummer  Exercises - Standing Isometric Shoulder External Rotation with Doorway and Towel Roll  - 1-2 x daily - 7 x weekly - 3 sets - 10 reps - 5 sec hold - Standing Isometric Shoulder Internal Rotation with Towel Roll at Doorway  - 1-2 x daily - 7 x weekly - 3 sets - 10 reps - 5 sec hold - Shoulder External Rotation and Scapular Retraction with Resistance  - 1-2 x daily - 7 x weekly - 3 sets - 10 reps - yellow band hold - Seated Scapular Retraction  - 1-2 x daily - 7 x weekly - 3 sets - 10 reps - 3 sec hold - Seated Overhead Press  - 1 x daily - 7 x weekly - 2 sets - 10 reps - Standing Shoulder Row with Anchored Resistance  - 1 x daily - 7 x weekly - 2 sets - 10 reps - Shoulder External Rotation  - 1 x daily - 7 x weekly - 2 sets - 10 reps - Shoulder Internal Rotation  - 1 x daily - 7 x weekly - 2 sets - 10 reps  ASSESSMENT:  CLINICAL IMPRESSION: 08/22/2023 Pratyush continues to make great progress with physical therapy noting no pain today. Continued working on bil shoulder strengthening with focus on isotonic exercises in standing with increased resistance. Pt required verbal cues for proper form throughout session. He  noted no pain during or following session.   Evaluation Patient is a 78 y.o. m who was seen today for physical therapy evaluation and treatment for chronic bilateral shoulder pain R>L. Physical findings are consistent with MD impression as pt demonstrates decrease in bilateral shoulder strength and ROM. Quick DASH score demonstrates moderate disability in performance of home ADLs and community activities. Pt would benefit from skilled PT services working on improving RTC and periscapular strength in order to improve dynamic stability and decrease shoulder pain.   OBJECTIVE IMPAIRMENTS: decreased activity tolerance, decreased balance, decreased endurance, difficulty walking, decreased ROM, decreased strength, impaired UE functional use, postural dysfunction, and pain   ACTIVITY LIMITATIONS: carrying, lifting, standing, squatting, stairs, transfers, reach over head, and hygiene/grooming  PARTICIPATION LIMITATIONS: meal prep, cleaning, driving, community activity, occupation, and yard work  PERSONAL FACTORS: Time since onset of injury/illness/exacerbation and 3+ comorbidities: CVA, DM II, HTN  are also affecting patient's functional outcome.   REHAB POTENTIAL: Good  CLINICAL DECISION MAKING: Stable/uncomplicated  EVALUATION COMPLEXITY: Moderate   GOALS: Goals reviewed with patient? No  SHORT TERM GOALS: Target date: 08/29/2023   Pt will be compliant and knowledgeable with initial HEP for improved comfort and carryover Baseline: initial HEP given  Goal status: INITIAL  2.  Pt will self report bilateral shoulder pain no greater than 4/10 for improved comfort and functional ability Baseline: 6/10 at worst Goal status: INITIAL   LONG TERM GOALS: Target date: 10/03/2023   Pt will decrease Quick DASH disability score to no greater than 20% as proxy for functional improvement Baseline: 34% disability  Goal status: INITIAL  2.  Pt will self report right shoulder pain no greater than 1-2/10  for improved comfort and functional ability Baseline:  6/10 at worst Goal status: INITIAL   3.  Pt will improve bilateral shoulder flexion ROM to no less than 130 degrees for improved functional ability reaching into cabinets and other ADLs Baseline: see ROM chart Goal status: INITIAL  4.  Pt will improve bilateral shoulder IR/ER MMT to no less than 5/5 for improved dynamic stability and decrease pain Baseline: check MMT chart Goal status: INITIAL  5.  Pt will improve bilateral IR to at least T10 for improved ability to perform self care and hygiene activities  Baseline: see ROM chart Goal status: INITIAL  PLAN:  PT FREQUENCY: 1x/week  PT DURATION: 8 weeks  PLANNED INTERVENTIONS: 97164- PT Re-evaluation, 97110-Therapeutic exercises, 97530- Therapeutic activity, [id number]- Neuromuscular re-education, 97535- Self Care, [id number]- Manual therapy, [id number]- Electrical stimulation (unattended), [id number]- Electrical stimulation (manual), 97016- Vasopneumatic device, Dry Needling, Cryotherapy, and Moist heat  PLAN FOR NEXT SESSION: assess HEP response, RTC and periscapular strengthening. Continued working on gross shoulder strengthening, progress endurance PRN. Gait belt due to fall risk.    Nichola Warren PT, DPT, LAT, ATC  08/22/23  10:13 AM  Laron Plummer PT, DPT, LAT, ATC  09/12/23  1:10 PM

## 2023-08-28 ENCOUNTER — Ambulatory Visit (INDEPENDENT_AMBULATORY_CARE_PROVIDER_SITE_OTHER): Admitting: Licensed Clinical Social Worker

## 2023-08-28 ENCOUNTER — Encounter (HOSPITAL_COMMUNITY): Payer: Self-pay | Admitting: Licensed Clinical Social Worker

## 2023-08-28 DIAGNOSIS — F339 Major depressive disorder, recurrent, unspecified: Secondary | ICD-10-CM | POA: Diagnosis not present

## 2023-08-28 NOTE — Progress Notes (Signed)
   THERAPIST PROGRESS NOTE  Session Time: 9:00am-9:55am  Participation Level: Active  Behavioral Response: Well GroomedAlertAnxious and Irritable  Type of Therapy: Individual Therapy  Treatment Goals addressed: "to cope with stress and manage depression and anxiety". Rodney Friedman will report improved stress management and coping skills, resulting in mood stability and less worrying 4 out of 7 days.    ProgressTowards Goals: Progressing  Interventions: Motivational Interviewing  Summary: Rodney Friedman is a 78 y.o. male who presents with MDD, recurrent, chronic.   Suicidal/Homicidal: Nowithout intent/plan  Therapist Response: Rodney Friedman engaged well in individual in person session with clinician. Clinician utilized Motivational Interviewing to explore mood and interactions. Rodney Friedman shared that he continues to struggle with daily life stressors, managing his care, and having an irritable attitude. 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 Clinician identified that everyone has to do the care management for themselves, but the attitude about doing this work is what makes it easier or more difficult. Clinician reflected that having a good attitude about the work he is doing will make it less frustrating and he will feel less irritable. Clinician encouraged Rodney Friedman to use positive self talk in order to improve his attitude. Rodney Friedman shared that he hired a new caregiver to help him get to appts and to help him navigate the system. Rodney Friedman shared she has been very helpful and he is glad she is part of his team.   Plan: Return again in 2 weeks.  Diagnosis: Major depression, recurrent, chronic (HCC)  Collaboration of Care: Psychiatrist AEB provided update to Dr. Donell Beers  Patient/Guardian was advised Release of Information must be obtained prior to any record release in order to collaborate their care with an outside provider. Patient/Guardian  was advised if they have not already done so to contact the registration department to sign all necessary forms in order for [place] to release information regarding their care.   Consent: Patient/Guardian gives verbal consent for treatment and assignment of benefits for services provided during this visit. Patient/Guardian expressed understanding and agreed to proceed.   Chryl Heck [place], LCSW 08/28/2023

## 2023-08-29 ENCOUNTER — Encounter: Payer: Self-pay | Admitting: Physical Therapy

## 2023-08-29 ENCOUNTER — Ambulatory Visit: Admitting: Physical Therapy

## 2023-08-29 DIAGNOSIS — M6281 Muscle weakness (generalized): Secondary | ICD-10-CM

## 2023-08-29 DIAGNOSIS — R293 Abnormal posture: Secondary | ICD-10-CM

## 2023-08-29 DIAGNOSIS — G8929 Other chronic pain: Secondary | ICD-10-CM

## 2023-08-29 DIAGNOSIS — M25511 Pain in right shoulder: Secondary | ICD-10-CM | POA: Diagnosis not present

## 2023-08-29 NOTE — Therapy (Addendum)
 OUTPATIENT PHYSICAL THERAPY TREATMENT   Patient Name: Rodney Friedman MRN: [id number] DOB:04/25/1946, 78 y.o., male Today's Date: 08/29/2023  END OF SESSION:  PT End of Session - 08/29/23 1536     Visit Number 4   Number of Visits 17    Date for PT Re-Evaluation 10/03/23    Authorization Type Medicare    Progress Note Due on Visit 10    PT Start Time 0933    PT Stop Time 1018    PT Time Calculation (min) 45 min    Equipment Utilized During Treatment Gait belt    Activity Tolerance Patient tolerated treatment well    Behavior During Therapy WFL for tasks assessed/performed               Past Medical History:  Diagnosis Date   Adenomatous colon polyp    Alcoholism (HCC)    Anxiety    Atrial fibrillation (HCC)    CAD (coronary artery disease)    Cataract    removed left eye    CHF (congestive heart failure) (HCC) 12/16/2020   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 Abel Hoe & Dr. Sherene Dilling   Sleep apnea    Stroke [facility])    Substance abuse (HCC)    Tremor, essential 09/22/2015   Past Surgical History:  Procedure Laterality Date   Arm Surgery Right    from door accident with glass   CARDIAC  CATHETERIZATION     CATARACT EXTRACTION W/ INTRAOCULAR LENS IMPLANT Bilateral    COLONOSCOPY     CORONARY ARTERY BYPASS GRAFT  08/04/2004   EYE SURGERY     POLYPECTOMY     RIGHT/LEFT HEART CATH AND CORONARY/GRAFT ANGIOGRAPHY N/A 01/20/2021   Procedure: RIGHT/LEFT HEART CATH AND CORONARY/GRAFT ANGIOGRAPHY;  Surgeon: Odie Benne, MD;  Location: [facility] INVASIVE CV LAB;  Service: Cardiovascular;  Laterality: N/A;   TEE WITHOUT CARDIOVERSION N/A 02/08/2021   Procedure: TRANSESOPHAGEAL ECHOCARDIOGRAM (TEE);  Surgeon: Odie Benne, MD;  Location: [facility] INVASIVE CV LAB;  Service: Open Heart Surgery;  Laterality: N/A;   TRANSCATHETER AORTIC VALVE REPLACEMENT, CAROTID Left 02/08/2021   Procedure: TRANSCATHETER AORTIC VALVE REPLACEMENT, LEFT CAROTID;  Surgeon: Odie Benne, MD;  Location: [facility] INVASIVE CV LAB;  Service: Open Heart Surgery;  Laterality: Left;   Patient Active Problem List   Diagnosis Date Noted   Acute pain of right knee 06/11/2023   Fall 06/11/2023   Need for influenza vaccination 01/25/2023   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   Acute CVA (cerebrovascular accident) (HCC) 01/03/2023   Acute ischemic stroke (HCC) 01/02/2023  Diverticulosis 10/12/2022   Health care maintenance 02/23/2022   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   Chronic pain 03/14/2019   Essential hypertension 07/27/2018   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   Diabetes mellitus (HCC) 04/21/2016   OSA (obstructive sleep apnea) 04/21/2016   Quality of life palliative care encounter 01/03/2016   Tremor, essential 09/22/2015   Glaucoma, open angle 06/16/2015   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   Pseudophakia 06/15/2015   CD (contact dermatitis) 04/29/2014   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   Hematuria 01/05/2014   Skin lesion 01/05/2014   Severe aortic stenosis 01/03/2014   Hypertensive heart disease 01/03/2014   Mixed  hyperlipidemia 01/03/2014   Mild obesity 01/03/2014   Chronic atrial fibrillation (HCC) 12/31/2013   Long term (current) use of anticoagulants 12/31/2013   Cataract, nuclear 10/06/2013   Dermatochalasis of eyelid 10/06/2013   Chorioretinal scar, 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/03/2013   Pre-diabetes 09/03/2013   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   Prostate cancer (HCC) 07/08/2013    PCP: Jerrlyn Morel, NP  REFERRING PROVIDER: Jerrlyn Morel, NP  REFERRING DIAG: M25.511,G89.29,M25.512 (ICD-10-CM) - Chronic pain of both shoulders   THERAPY DIAG:  Chronic right shoulder pain  Muscle weakness (generalized)  Abnormal posture  Rationale for Evaluation and Treatment: Rehabilitation  ONSET DATE: Chronic  SUBJECTIVE:                                                                                                                                                                                      SUBJECTIVE STATEMENT: 08/29/2023 "I've been doing my exercises at home daily, and also working on the hedges. I have also been working on the exercises for the back too."  PERTINENT HISTORY: CVA, DM II, HTN  PAIN:  Are you having pain?  Yes: NPRS scale: 0/10 Worst: 6/10 Pain location: anterior shoulder R>L Pain description: sharp, clicking  Aggravating factors: reaching OH, lifting Relieving factors: none  PRECAUTIONS: Fall  RED FLAGS: None   WEIGHT BEARING RESTRICTIONS: No  FALLS:  Has patient fallen in last 6 months? Yes. Number of falls 3 - mechanical falls while at home  LIVING ENVIRONMENT: Lives with: lives alone Lives in: House/apartment  OCCUPATION: Retired  PLOF: Independent and lives alone  PATIENT GOALS: Pt wants to decrease shoulder pain and discomfort in order to improve comfort with home ADLs and yard work  NEXT MD VISIT: Not currently  scheduled  OBJECTIVE:  Note: Objective measures were completed at Evaluation unless otherwise noted.  DIAGNOSTIC FINDINGS:  None  PATIENT  SURVEYS:  Quick DASH: 34% disability  COGNITION: Overall cognitive status: Within functional limits for tasks assessed     SENSATION: WFL  POSTURE: Rounded shoulders, forward head, R AC joint piano key  UPPER EXTREMITY ROM:   Active ROM Right eval Left eval  Shoulder flexion 88 108  Shoulder extension    Shoulder abduction 85 120  Shoulder adduction    Shoulder internal rotation L5 L2  Shoulder external rotation 40 30  Elbow flexion    Elbow extension    Wrist flexion    Wrist extension    Wrist ulnar deviation    Wrist radial deviation    Wrist pronation    Wrist supination    (Blank rows = not tested)  UPPER EXTREMITY MMT:  MMT Right eval Left eval  Shoulder flexion    Shoulder extension    Shoulder abduction    Shoulder adduction    Shoulder internal rotation 4/5 4/5  Shoulder external rotation 3+/5 4/5  Middle trapezius    Lower trapezius    Elbow flexion    Elbow extension    Wrist flexion    Wrist extension    Wrist ulnar deviation    Wrist radial deviation    Wrist pronation    Wrist supination    Grip strength (lbs)    (Blank rows = not tested)  SHOULDER SPECIAL TESTS: Impingement tests: Painful arc test: positive  SLAP lesions: DNT Instability tests: DNT Rotator cuff assessment: DNT Biceps assessment: DNT  PALPATION:  TTP to R infraspinatus                                                                                                                           TREATMENT: [facility] Adult PT Treatment:                                                DATE: 08/29/23 Standing ER/ IR bil 2 x 12 with GTB Standing shoulder rows 2 x 20 with GTB Standing shoulder rows 2 x 20 with GTB Supine chest press with plus bil UE with dowel with 5# [place] ho    [facility] Adult PT Treatment:                                                 DATE: 08/22/23 Standing rows 2 x 15 with GTB Standing shoulder extension 2 x 15 GTB Standing bil shoulder ER/IR 1 x 12 with GTB Seated scaption bil with 1# Overhead pressure with 1# bil, 2 x 10    [facility] Adult PT Treatment:  DATE: 08/15/23 Isometric IR/ER 1 x 10 holding 3 sec ea. Rows with RTB 2 x 12 - 2nd set tactile cues for scapular retraction.  Seated scaption RUE 2 x 10 1# Seated scapular retration with ER 2 x 10 with YTB - verbal cues for proper form Update HEP for overhead press  PATIENT EDUCATION: Education details: eval findings, Quick DASH, HEP, POC Person educated: Patient Education method: Explanation, Demonstration, and Handouts Education comprehension: verbalized understanding and returned demonstration  HOME EXERCISE PROGRAM: Access Code: [email or web address] URL: [email or web address] Date: 08/22/2023 Prepared by: Laron Plummer  Exercises - Standing Isometric Shoulder External Rotation with Doorway and Towel Roll  - 1-2 x daily - 7 x weekly - 3 sets - 10 reps - 5 sec hold - Standing Isometric Shoulder Internal Rotation with Towel Roll at Doorway  - 1-2 x daily - 7 x weekly - 3 sets - 10 reps - 5 sec hold - Shoulder External Rotation and Scapular Retraction with Resistance  - 1-2 x daily - 7 x weekly - 3 sets - 10 reps - yellow band hold - Seated Scapular Retraction  - 1-2 x daily - 7 x weekly - 3 sets - 10 reps - 3 sec hold - Seated Overhead Press  - 1 x daily - 7 x weekly - 2 sets - 10 reps - Standing Shoulder Row with Anchored Resistance  - 1 x daily - 7 x weekly - 2 sets - 10 reps - Shoulder External Rotation  - 1 x daily - 7 x weekly - 2 sets - 10 reps - Shoulder Internal Rotation  - 1 x daily - 7 x weekly - 2 sets - 10 reps  ASSESSMENT:  CLINICAL IMPRESSION: 08/29/2023 Rodney Friedman reports to PT today noting that he can tell that he is making progress and getting stronger. Continued working on gross  shoulder strengthening with increased reps and limited rest between sets. He did very well with all exercises today and noted feeling like he is making progress. He expressed concern regarding some anxiety with come to his appointment related to getting lost, discussed he is welcome to call our clinic if that were to occur, and provided techniques to help descalate his system when he gets into moments like that.    Evaluation Patient is a 79 y.o. m who was seen today for physical therapy evaluation and treatment for chronic bilateral shoulder pain R>L. Physical findings are consistent with MD impression as pt demonstrates decrease in bilateral shoulder strength and ROM. Quick DASH score demonstrates moderate disability in performance of home ADLs and community activities. Pt would benefit from skilled PT services working on improving RTC and periscapular strength in order to improve dynamic stability and decrease shoulder pain.   OBJECTIVE IMPAIRMENTS: decreased activity tolerance, decreased balance, decreased endurance, difficulty walking, decreased ROM, decreased strength, impaired UE functional use, postural dysfunction, and pain   ACTIVITY LIMITATIONS: carrying, lifting, standing, squatting, stairs, transfers, reach over head, and hygiene/grooming  PARTICIPATION LIMITATIONS: meal prep, cleaning, driving, community activity, occupation, and yard work  PERSONAL FACTORS: Time since onset of injury/illness/exacerbation and 3+ comorbidities: CVA, DM II, HTN  are also affecting patient's functional outcome.   REHAB POTENTIAL: Good  CLINICAL DECISION MAKING: Stable/uncomplicated  EVALUATION COMPLEXITY: Moderate   GOALS: Goals reviewed with patient? No  SHORT TERM GOALS: Target date: 08/29/2023   Pt will be compliant and knowledgeable with initial HEP for improved comfort and carryover Baseline: initial HEP given  Goal status: Met 08/29/2023  2.  Pt  will self report bilateral shoulder pain no  greater than 4/10 for improved comfort and functional ability Baseline: 6/10 at worst Goal status: Met 08/29/2023   LONG TERM GOALS: Target date: 10/03/2023   Pt will decrease Quick DASH disability score to no greater than 20% as proxy for functional improvement Baseline: 34% disability  Goal status: INITIAL  2.  Pt will self report right shoulder pain no greater than 1-2/10 for improved comfort and functional ability Baseline: 6/10 at worst Goal status: INITIAL   3.  Pt will improve bilateral shoulder flexion ROM to no less than 130 degrees for improved functional ability reaching into cabinets and other ADLs Baseline: see ROM chart Goal status: INITIAL  4.  Pt will improve bilateral shoulder IR/ER MMT to no less than 5/5 for improved dynamic stability and decrease pain Baseline: check MMT chart Goal status: INITIAL  5.  Pt will improve bilateral IR to at least T10 for improved ability to perform self care and hygiene activities  Baseline: see ROM chart Goal status: INITIAL  PLAN:  PT FREQUENCY: 1x/week  PT DURATION: 8 weeks  PLANNED INTERVENTIONS: 97164- PT Re-evaluation, 97110-Therapeutic exercises, 97530- Therapeutic activity, [id number]- Neuromuscular re-education, 97535- Self Care, 16109- Manual therapy, [id number]- Electrical stimulation (unattended), [id number]- Electrical stimulation (manual), 97016- Vasopneumatic device, Dry Needling, Cryotherapy, and Moist heat  PLAN FOR NEXT SESSION: assess HEP response, RTC and periscapular strengthening. Continued working on gross shoulder strengthening, progress endurance PRN. Gait belt due to fall risk.    Athea Haley PT, DPT, LAT, ATC  08/29/23  3:42 PM    Shikita Vaillancourt PT, DPT, LAT, ATC  09/12/23  1:11 PM

## 2023-09-04 ENCOUNTER — Encounter (HOSPITAL_COMMUNITY): Payer: Self-pay | Admitting: Psychiatry

## 2023-09-04 ENCOUNTER — Ambulatory Visit (HOSPITAL_BASED_OUTPATIENT_CLINIC_OR_DEPARTMENT_OTHER): Admitting: Psychiatry

## 2023-09-04 VITALS — BP 157/93 | HR 77 | Ht 68.0 in | Wt 203.0 lb

## 2023-09-04 DIAGNOSIS — F3342 Major depressive disorder, recurrent, in full remission: Secondary | ICD-10-CM | POA: Diagnosis not present

## 2023-09-04 MED ORDER — BUPROPION HCL ER (SR) 100 MG PO TB12: ORAL_TABLET | ORAL | 4 refills | Status: DC

## 2023-09-04 MED ORDER — BUPROPION HCL ER (SR) 200 MG PO TB12: 200.0000 mg | ORAL_TABLET | Freq: Every morning | ORAL | 2 refills | Status: DC

## 2023-09-04 NOTE — Progress Notes (Signed)
 Psychiatric Initial Adult Assessment   Patient Identification: Rodney Friedman MRN:  [id number] Date of Evaluation:  09/04/2023 Referral Source: Dr. Chilton Si Chief Complaint: Need follow-up care    Visit Diagnosis: Bipolar disorder, substance use disorder Bipolar disorder  History of Present Illness:   Today emotionally the patient is doing well.  He still has a lot of pain in his right knee and now his shoulders.  He is involved in a lot of physical therapy.  The patient still keeps himself busy mainly with crossword puzzles.  His TV is not working well.  The patient does have a son who is 31 years old who has a great aunt who has a daughter who is 67.  The patient also has a daughter who is in [place].  Overall his family is fairly supportive.  The patient goes to AA a few times a week.  He continues in one-to-one therapy.  He is sleeping and eating very well.  He is getting good energy.  He has lost a little weight because he wanted to.  He occasionally goes to NA as well.  Overall he is positive and optimistic.  He keeps his brain active.   Substance Abuse History in the last 12 months:  Yes.    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 Clifton James & Dr. Laneta Simmers   Sleep apnea    Stroke [facility])    Substance abuse (HCC)    Tremor, essential 09/22/2015    Past Surgical History:  Procedure Laterality Date   Arm Surgery Right    from door accident with glass   CARDIAC CATHETERIZATION     CATARACT EXTRACTION W/ INTRAOCULAR LENS IMPLANT Bilateral    COLONOSCOPY     CORONARY ARTERY BYPASS GRAFT  08/04/2004   EYE SURGERY     POLYPECTOMY     RIGHT/LEFT HEART CATH AND CORONARY/GRAFT ANGIOGRAPHY N/A 01/20/2021   Procedure: RIGHT/LEFT HEART CATH AND CORONARY/GRAFT ANGIOGRAPHY;  Surgeon: Kathleene Hazel, MD;  Location: [facility] INVASIVE CV LAB;  Service: Cardiovascular;  Laterality: N/A;   TEE WITHOUT CARDIOVERSION N/A 02/08/2021   Procedure: TRANSESOPHAGEAL ECHOCARDIOGRAM (TEE);  Surgeon: Kathleene Hazel, MD;  Location: [facility] INVASIVE CV LAB;  Service: Open Heart Surgery;  Laterality: N/A;   TRANSCATHETER AORTIC VALVE REPLACEMENT, CAROTID Left 02/08/2021   Procedure: TRANSCATHETER AORTIC VALVE REPLACEMENT, LEFT CAROTID;  Surgeon: Kathleene Hazel, MD;  Location: [facility] INVASIVE CV LAB;  Service: Open Heart Surgery;  Laterality: Left;    Family Psychiatric History:   Family History:  Family History  Problem Relation Age of Onset   Colon cancer Mother    Ovarian cancer Mother    Uterine cancer Mother    Hypertension Father    Prostate cancer Father    Drug abuse Daughter    Colon polyps Neg Hx     Social History:   Social History   Socioeconomic History   Marital status: Divorced    Spouse name: Not on file   Number of children: 2   Years of education: 16   Highest education level:  Master's degree (e.g., MA, MS, MEng, MEd, MSW, MBA)  Occupational History   Occupation: Retired Runner, broadcasting/film/video  Tobacco Use   Smoking status: Former    Current packs/day: 0.00    Average packs/day: 0.8 packs/day for 59.0 years (44.3 ttl pk-yrs)    Types: Cigarettes    Start date: 12/16/1961    Quit date: 12/16/2020    Years since quitting: 2.7    Passive exposure: Past   Smokeless tobacco: Never  Vaping Use   Vaping status: Never Used  Substance and Sexual Activity   Alcohol use: No    Comment: none since march 3,2002   Drug use: No   Sexual activity: Not Currently  Other Topics Concern   Not on file  Social History Narrative   Lives alone   Divorced   Right-handed   Education: College   No caffeine   Social Drivers of Health   Financial Resource Strain: Medium Risk (06/07/2023)   Overall Financial Resource Strain (CARDIA)    Difficulty of Paying Living Expenses: Somewhat hard  Food Insecurity: No Food Insecurity (06/07/2023)   Hunger Vital Sign    Worried About Running Out of Food in the Last Year: Never true    Ran Out of Food in the Last Year: Never true  Transportation Needs: No Transportation Needs (06/22/2023)   PRAPARE - Administrator, Civil Service (Medical): No    Lack of Transportation (Non-Medical): No  Recent Concern: Transportation Needs - Unmet Transportation Needs (06/07/2023)   PRAPARE - Transportation    Lack of Transportation (Medical): Yes    Lack of Transportation (Non-Medical): Yes  Physical Activity: Insufficiently Active (06/07/2023)   Exercise Vital Sign    Days of Exercise per Week: 4 days    Minutes of Exercise per Session: 30 min  Stress: No Stress Concern Present (06/07/2023)   [employer] of Occupational Health - Occupational Stress Questionnaire    Feeling of Stress : Only a little  Recent Concern: Stress - Stress Concern Present (04/22/2023)   [employer] of Occupational Health - Occupational Stress Questionnaire    Feeling  of Stress : Very much  Social Connections: Moderately Integrated (06/07/2023)   Social Connection and Isolation Panel [NHANES]    Frequency of Communication with Friends and Family: Three times a week    Frequency of Social Gatherings with Friends and Family: More than three times a week    Attends Religious Services: More than 4 times per year    Active Member of Clubs or Organizations: Yes    Attends Banker Meetings: More than 4 times per year  Marital Status: Divorced    Additional Social History:   Allergies:   Allergies  Allergen Reactions   Other Rash    Allergen: "Plants and bushes while doing yard work"   [employer] Nausea And Vomiting   Poison Ivy Extract Rash    Metabolic Disorder Labs: Lab Results  Component Value Date   HGBA1C 6.6 (A) 07/27/2023   MPG 148.46 01/03/2023   MPG 128 12/17/2020   No results found for: "PROLACTIN" Lab Results  Component Value Date   CHOL 88 01/03/2023   TRIG 140 01/03/2023   HDL 32 (L) 01/03/2023   CHOLHDL 2.8 01/03/2023   VLDL 28 01/03/2023   LDLCALC 28 01/03/2023   LDLCALC 42 05/26/2022     Current Medications: Current Outpatient Medications  Medication Sig Dispense Refill   acetaminophen (TYLENOL) 325 MG tablet Take 1-2 tablets (325-650 mg total) by mouth every 4 (four) hours as needed for mild pain.     apixaban (ELIQUIS) 5 MG TABS tablet Take 1 tablet (5 mg total) by mouth 2 (two) times daily. 180 tablet 3   calcium carbonate (TUMS - DOSED IN MG ELEMENTAL CALCIUM) 500 MG chewable tablet Chew 1 tablet by mouth 2 (two) times daily.     cyanocobalamin (VITAMIN B12) 500 MCG tablet Take 1 tablet (500 mcg total) by mouth every other day. 60 tablet 2   Denosumab (PROLIA )      docusate sodium (COLACE) 100 MG capsule Take 1 capsule (100 mg total) by mouth 2 (two) times daily.     ezetimibe (ZETIA) 10 MG tablet Take 1 tablet (10 mg total) by mouth daily. 90 tablet 3   gabapentin (NEURONTIN) 300 MG capsule TAKE 1  CAPSULE BY MOUTH EVERYDAY AT BEDTIME 90 capsule 1   Glucose Blood (BLOOD GLUCOSE TEST STRIPS) STRP Inject 1 each into the skin 2 (two) times daily. May substitute to any manufacturer covered by patient's insurance. Dx code E11.69 100 each 0   Lancets Misc. MISC Inject 1 each into the skin 2 (two) times daily. May substitute to any manufacturer covered by patient's insurance. Dx code E11.69 100 each 1   latanoprost (XALATAN) 0.005 % ophthalmic solution Place 1 drop into the left eye every other day.     magnesium oxide (MAG-OX) 400 (240 Mg) MG tablet Take 400 mg by mouth daily.     metFORMIN (GLUCOPHAGE) 500 MG tablet Take 1 tablet (500 mg total) by mouth 2 (two) times daily with a meal. 180 tablet 1   metoprolol succinate (TOPROL-XL) 50 MG 24 hr tablet Take 1 tablet (50 mg total) by mouth 2 (two) times daily. Take with or immediately following a meal. 180 tablet 0   rosuvastatin (CRESTOR) 20 MG tablet Take 1 tablet (20 mg total) by mouth daily. 90 tablet 3   buPROPion (WELLBUTRIN SR) 200 MG 12 hr tablet Take 1 tablet (200 mg total) by mouth every morning. 90 tablet 2   buPROPion ER (WELLBUTRIN SR) 100 MG 12 hr tablet 1 qhs 90 tablet 4   No current facility-administered medications for this visit.    Neurologic: Headache: No Seizure: No Paresthesias:No  Musculoskeletal: Strength & Muscle Tone: within normal limits Gait & Station: normal Patient leans: Right  Psychiatric Specialty Exam: ROS  Blood pressure (!) 157/93, pulse 77, height 5\' 8"  (1.727 m), weight 203 lb (92.1 kg).Body mass index is 30.87 kg/m.  General Appearance: Casual  Eye Contact:  Good  Speech:  Clear and Coherent  Volume:  Normal  Mood:  NA  Affect:  Appropriate  Thought Process:  Goal Directed  Orientation:  NA  Thought Content:  WDL  Suicidal Thoughts:  No  Homicidal Thoughts:  No  Memory:  Negative  Judgement:  Good  Insight:  Good  Psychomotor Activity:  Normal  Concentration:    Recall:  Fair  Fund of  Knowledge:Good  Language: Good  Akathisia:  No  Handed:  Right  AIMS (if indicated):    Assets:  Desire for Improvement  ADL's:  Intact  Cognition: WNL  Sleep:      Treatment Plan Summary: 4/15/20253:37 PM     Today the patient is doing well.  His diagnosis is major depression.  He continues taking Wellbutrin as prescribed.  Presenting problem is alcohol dependency.  He continues in [place].  Patient continues in one-to-one therapy.  He will return to see me in 4 months.

## 2023-09-05 ENCOUNTER — Ambulatory Visit

## 2023-09-05 DIAGNOSIS — M25511 Pain in right shoulder: Secondary | ICD-10-CM | POA: Diagnosis not present

## 2023-09-05 DIAGNOSIS — M6281 Muscle weakness (generalized): Secondary | ICD-10-CM

## 2023-09-05 DIAGNOSIS — G8929 Other chronic pain: Secondary | ICD-10-CM

## 2023-09-05 NOTE — Therapy (Signed)
 OUTPATIENT PHYSICAL THERAPY TREATMENT   Patient Name: Rodney Friedman MRN: [id number] DOB:Dec 09, 1945, 78 y.o., male Today's Date: 09/05/2023  END OF SESSION:  PT End of Session - 09/05/23 0918     Visit Number 4    Number of Visits 17    Date for PT Re-Evaluation 10/03/23    Authorization Type Medicare    Progress Note Due on Visit 10    PT Start Time 0930    PT Stop Time 1010    PT Time Calculation (min) 40 min    Equipment Utilized During Treatment Gait belt    Activity Tolerance Patient tolerated treatment well    Behavior During Therapy WFL for tasks assessed/performed                Past Medical History:  Diagnosis Date   Adenomatous colon polyp    Alcoholism (HCC)    Anxiety    Atrial fibrillation (HCC)    CAD (coronary artery disease)    Cataract    removed left eye    CHF (congestive heart failure) (HCC) 12/16/2020   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 Clifton James & Dr. Laneta Simmers   Sleep apnea    Stroke [facility])    Substance abuse (HCC)    Tremor, essential 09/22/2015   Past Surgical History:  Procedure Laterality Date   Arm Surgery Right    from door accident with glass   CARDIAC  CATHETERIZATION     CATARACT EXTRACTION W/ INTRAOCULAR LENS IMPLANT Bilateral    COLONOSCOPY     CORONARY ARTERY BYPASS GRAFT  08/04/2004   EYE SURGERY     POLYPECTOMY     RIGHT/LEFT HEART CATH AND CORONARY/GRAFT ANGIOGRAPHY N/A 01/20/2021   Procedure: RIGHT/LEFT HEART CATH AND CORONARY/GRAFT ANGIOGRAPHY;  Surgeon: Kathleene Hazel, MD;  Location: [facility] INVASIVE CV LAB;  Service: Cardiovascular;  Laterality: N/A;   TEE WITHOUT CARDIOVERSION N/A 02/08/2021   Procedure: TRANSESOPHAGEAL ECHOCARDIOGRAM (TEE);  Surgeon: Kathleene Hazel, MD;  Location: [facility] INVASIVE CV LAB;  Service: Open Heart Surgery;  Laterality: N/A;   TRANSCATHETER AORTIC VALVE REPLACEMENT, CAROTID Left 02/08/2021   Procedure: TRANSCATHETER AORTIC VALVE REPLACEMENT, LEFT CAROTID;  Surgeon: Kathleene Hazel, MD;  Location: [facility] INVASIVE CV LAB;  Service: Open Heart Surgery;  Laterality: Left;   Patient Active Problem List   Diagnosis Date Noted   Acute pain of right knee 06/11/2023   Fall 06/11/2023   Need for influenza vaccination 01/25/2023   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   Acute CVA (cerebrovascular accident) (HCC) 01/03/2023   Acute ischemic stroke (  HCC) 01/02/2023   Diverticulosis 10/12/2022   Health care maintenance 02/23/2022   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   Chronic pain 03/14/2019   Essential hypertension 07/27/2018   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   Diabetes mellitus (HCC) 04/21/2016   OSA (obstructive sleep apnea) 04/21/2016   Quality of life palliative care encounter 01/03/2016   Tremor, essential 09/22/2015   Glaucoma, open angle 06/16/2015   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   Pseudophakia 06/15/2015   CD (contact dermatitis) 04/29/2014   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   Hematuria 01/05/2014   Skin lesion 01/05/2014   Severe aortic stenosis 01/03/2014   Hypertensive heart disease 01/03/2014   Mixed  hyperlipidemia 01/03/2014   Mild obesity 01/03/2014   Chronic atrial fibrillation (HCC) 12/31/2013   Long term (current) use of anticoagulants 12/31/2013   Cataract, nuclear 10/06/2013   Dermatochalasis of eyelid 10/06/2013   Chorioretinal scar, 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/03/2013   Pre-diabetes 09/03/2013   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   Prostate cancer (HCC) 07/08/2013    PCP: Jerrlyn Morel, NP  REFERRING PROVIDER: Jerrlyn Morel, NP  REFERRING DIAG: M25.511,G89.29,M25.512 (ICD-10-CM) - Chronic pain of both shoulders   THERAPY DIAG:  Chronic right shoulder pain  Muscle weakness (generalized)  Rationale for Evaluation and Treatment: Rehabilitation  ONSET DATE: Chronic  SUBJECTIVE:                                                                                                                                                                                      SUBJECTIVE STATEMENT: 09/05/2023 Pt presents to PT with reports of continued improvement in each shoulder. Has been compliant with HEP with no adverse effect.   PERTINENT HISTORY: CVA, DM II, HTN  PAIN:  Are you having pain?  Yes: NPRS scale: 0/10 Worst: 6/10 Pain location: anterior shoulder R>L Pain description: sharp, clicking  Aggravating factors: reaching OH, lifting Relieving factors: none  PRECAUTIONS: Fall  RED FLAGS: None   WEIGHT BEARING RESTRICTIONS: No  FALLS:  Has patient fallen in last 6 months? Yes. Number of falls 3 - mechanical falls while at home  LIVING ENVIRONMENT: Lives with: lives alone Lives in: House/apartment  OCCUPATION: Retired  PLOF: Independent and lives alone  PATIENT GOALS: Pt wants to decrease shoulder pain and discomfort in order to improve comfort with home ADLs and yard work  NEXT MD VISIT: Not currently scheduled  OBJECTIVE:  Note: Objective  measures were completed at Evaluation unless otherwise noted.  DIAGNOSTIC FINDINGS:  None  PATIENT SURVEYS:  Quick  DASH: 34% disability  COGNITION: Overall cognitive status: Within functional limits for tasks assessed     SENSATION: WFL  POSTURE: Rounded shoulders, forward head, R AC joint piano key  UPPER EXTREMITY ROM:   Active ROM Right eval Left eval  Shoulder flexion 88 108  Shoulder extension    Shoulder abduction 85 120  Shoulder adduction    Shoulder internal rotation L5 L2  Shoulder external rotation 40 30  Elbow flexion    Elbow extension    Wrist flexion    Wrist extension    Wrist ulnar deviation    Wrist radial deviation    Wrist pronation    Wrist supination    (Blank rows = not tested)  UPPER EXTREMITY MMT:  MMT Right eval Left eval  Shoulder flexion    Shoulder extension    Shoulder abduction    Shoulder adduction    Shoulder internal rotation 4/5 4/5  Shoulder external rotation 3+/5 4/5  Middle trapezius    Lower trapezius    Elbow flexion    Elbow extension    Wrist flexion    Wrist extension    Wrist ulnar deviation    Wrist radial deviation    Wrist pronation    Wrist supination    Grip strength (lbs)    (Blank rows = not tested)  SHOULDER SPECIAL TESTS: Impingement tests: Painful arc test: positive  SLAP lesions: DNT Instability tests: DNT Rotator cuff assessment: DNT Biceps assessment: DNT  PALPATION:  TTP to R infraspinatus                                                                                                                           TREATMENT: [facility] Adult PT Treatment:                                                DATE: 09/05/23 Supine shoulder flexion 1x10 1#; 1x10 2# Supine horizontal abd 2x10 GTB Standing ER/ IR bil 3x10 with GTB Standing shoulder rows 2 x 20 with GTB Inclined table slide 2x15 each shoulder flexion Seated bilateral ER 2x10 YTB  [facility] Adult PT Treatment:                                                 DATE: 08/29/23 Standing ER/ IR bil 2 x 12 with GTB Standing shoulder rows 2 x 20 with GTB Standing shoulder rows 2 x 20 with GTB Supine chest press with plus bil UE with dowel with 5#  [facility] Adult PT Treatment:  DATE: 08/22/23 Standing rows 2 x 15 with GTB Standing shoulder extension 2 x 15 GTB Standing bil shoulder ER/IR 1 x 12 with GTB Seated scaption bil with 1# Overhead pressure with 1# bil, 2 x 10    [facility] Adult PT Treatment:                                                DATE: 08/15/23 Isometric IR/ER 1 x 10 holding 3 sec ea. Rows with RTB 2 x 12 - 2nd set tactile cues for scapular retraction.  Seated scaption RUE 2 x 10 1# Seated scapular retration with ER 2 x 10 with YTB - verbal cues for proper form Update HEP for overhead press  PATIENT EDUCATION: Education details: HEP update Person educated: Patient Education method: Explanation, Demonstration, and Handouts Education comprehension: verbalized understanding and returned demonstration  HOME EXERCISE PROGRAM: Access Code: [email or web address] URL: [email or web address] Date: 08/22/2023 Prepared by: Laron Plummer  Exercises - Standing Isometric Shoulder External Rotation with Doorway and Towel Roll  - 1-2 x daily - 7 x weekly - 3 sets - 10 reps - 5 sec hold - Standing Isometric Shoulder Internal Rotation with Towel Roll at Doorway  - 1-2 x daily - 7 x weekly - 3 sets - 10 reps - 5 sec hold - Shoulder External Rotation and Scapular Retraction with Resistance  - 1-2 x daily - 7 x weekly - 3 sets - 10 reps - yellow band hold - Seated Scapular Retraction  - 1-2 x daily - 7 x weekly - 3 sets - 10 reps - 3 sec hold - Seated Overhead Press  - 1 x daily - 7 x weekly - 2 sets - 10 reps - Standing Shoulder Row with Anchored Resistance  - 1 x daily - 7 x weekly - 2 sets - 10 reps - Shoulder External Rotation  - 1 x daily - 7 x weekly - 2 sets - 10 reps - Shoulder  Internal Rotation  - 1 x daily - 7 x weekly - 2 sets - 10 reps  ASSESSMENT:  CLINICAL IMPRESSION: 09/05/2023 Pt was able to complete all prescribed exercises with no adverse effect. Exercises today focused on improving RTC and periscapular strength with initiation of shoulder flexion in supine for improving function. HEP updated for supine flexion strengthening. He is progressing well with therapy, will continue to progress as able per POC.    Evaluation Patient is a 78 y.o. m who was seen today for physical therapy evaluation and treatment for chronic bilateral shoulder pain R>L. Physical findings are consistent with MD impression as pt demonstrates decrease in bilateral shoulder strength and ROM. Quick DASH score demonstrates moderate disability in performance of home ADLs and community activities. Pt would benefit from skilled PT services working on improving RTC and periscapular strength in order to improve dynamic stability and decrease shoulder pain.   OBJECTIVE IMPAIRMENTS: decreased activity tolerance, decreased balance, decreased endurance, difficulty walking, decreased ROM, decreased strength, impaired UE functional use, postural dysfunction, and pain   ACTIVITY LIMITATIONS: carrying, lifting, standing, squatting, stairs, transfers, reach over head, and hygiene/grooming  PARTICIPATION LIMITATIONS: meal prep, cleaning, driving, community activity, occupation, and yard work  PERSONAL FACTORS: Time since onset of injury/illness/exacerbation and 3+ comorbidities: CVA, DM II, HTN  are also affecting patient's functional outcome.   REHAB POTENTIAL: Good  CLINICAL DECISION MAKING: Stable/uncomplicated  EVALUATION COMPLEXITY: Moderate   GOALS: Goals reviewed with patient? No  SHORT TERM GOALS: Target date: 08/29/2023   Pt will be compliant and knowledgeable with initial HEP for improved comfort and carryover Baseline: initial HEP given  Goal status: Met 08/29/2023  2.  Pt will self  report bilateral shoulder pain no greater than 4/10 for improved comfort and functional ability Baseline: 6/10 at worst Goal status: Met 08/29/2023   LONG TERM GOALS: Target date: 10/03/2023   Pt will decrease Quick DASH disability score to no greater than 20% as proxy for functional improvement Baseline: 34% disability  Goal status: INITIAL  2.  Pt will self report right shoulder pain no greater than 1-2/10 for improved comfort and functional ability Baseline: 6/10 at worst Goal status: INITIAL   3.  Pt will improve bilateral shoulder flexion ROM to no less than 130 degrees for improved functional ability reaching into cabinets and other ADLs Baseline: see ROM chart Goal status: INITIAL  4.  Pt will improve bilateral shoulder IR/ER MMT to no less than 5/5 for improved dynamic stability and decrease pain Baseline: check MMT chart Goal status: INITIAL  5.  Pt will improve bilateral IR to at least T10 for improved ability to perform self care and hygiene activities  Baseline: see ROM chart Goal status: INITIAL  PLAN:  PT FREQUENCY: 1x/week  PT DURATION: 8 weeks  PLANNED INTERVENTIONS: 97164- PT Re-evaluation, 97110-Therapeutic exercises, 97530- Therapeutic activity, [id number]- Neuromuscular re-education, 97535- Self Care, 16109- Manual therapy, [id number]- Electrical stimulation (unattended), [id number]- Electrical stimulation (manual), 97016- Vasopneumatic device, Dry Needling, Cryotherapy, and Moist heat  PLAN FOR NEXT SESSION: assess HEP response, RTC and periscapular strengthening. Continued working on gross shoulder strengthening, progress endurance PRN. Gait belt due to fall risk.    Ivor Mars PT  09/05/23 11:21 AM

## 2023-09-10 ENCOUNTER — Other Ambulatory Visit: Payer: Self-pay

## 2023-09-10 NOTE — Patient Instructions (Signed)
 Mr. Volkert,   It was a pleasure to speak with you today. Please remember to check your blood pressure (before medication and eating/drinking anything) and fasting blood sugar at least once weekly on Thursday (as we discussed this day during our conversation today).   Thank you for allowing pharmacy to be a part of this patient's care.   Alexandria Angel, PharmD Clinical Pharmacist Cell: [phone]

## 2023-09-10 NOTE — Progress Notes (Signed)
 09/10/2023 Name: Rajveer Handler MRN: [id number] DOB: 1945/12/10  No chief complaint on file.   Brylan Dec is a 78 y.o. year old male who presented for a telephone visit.   They were referred to the pharmacist by their PCP for assistance in managing hypertension and medication access.    Subjective:  Care Team: Primary Care Provider: Jerrlyn Morel, NP ; Next Scheduled Visit: 11/05/2023 Cardiologist: Luana Rumple, MD ; Next Scheduled Visit: 10/26/2023  Medication Access/Adherence  Current Pharmacy:  [id number] [address] [place] [address] Phone: [phone] Fax: [phone]  [facility] [address] [place] [address] Phone: [phone] Fax: [phone]   Patient reports affordability concerns with their medications: Yes  Patient reports access/transportation concerns to their pharmacy: No  Patient reports adherence concerns with their medications:  Yes  $1900 for the year for Eliquis  and was previously denied PAP. "I miss my pills in the morning once in 10 days, but I know for sure I never miss my night time medication." Overall, he feels that he takes his medications pretty well.  -- He reports that he has a blood pressure machine/cuff and BGM paid through [facility] and was partially reimbursed.    Diabetes:  Current medications: Metformin  500 mg twice daily    Current glucose readings: N/A   Using BGM meter; yet does not test blood glucose - He does report that he has a glucometer and has not been checking his blood glucose. He has been feeling overwhelmed with trying to manage his health concerns such as seeing PT, prostate cancer management, behavioral health for depression and anxiety, etc.      Patient denies hypoglycemic s/sx including dizziness, shakiness, sweating. Patient reports hyperglycemic symptoms including nocturia (but has  baseline prostate issues).  Current meal patterns:  - no changes in diet from last encounter, "lots of macaroni", happily reports that he reduced the amount of ice cream  - Drinks coffee, unsweetened tea, bottled water, Premier protein, diet coke/Pepsi, OJ (on occasion)  - He avoids sweetened beverages     Current physical activity: Physical Therapy once weekly    Hypertension:  Current medications: Metoprolol  XL 50 mg twice daily (I previously spoke with cardiologist about he is okay with dosing for this patient)   Patient has a validated, automated, upper arm home BP cuff   Patient denies hypotensive s/sx including dizziness, lightheadedness.  Patient reports hypertensive symptoms including shortness of breath (h/o COPD)    Hyperlipidemia/ASCVD Risk Reduction  Current lipid lowering medications: Zetia  10 mg daily, rosuvastatin  20 mg daily    Objective:  Lab Results  Component Value Date   HGBA1C 6.6 (A) 07/27/2023    Lab Results  Component Value Date   CREATININE 1.20 04/26/2023   BUN 10 04/26/2023   NA 146 (H) 04/26/2023   K 4.6 04/26/2023   CL 108 (H) 04/26/2023   CO2 22 04/26/2023    Lab Results  Component Value Date   CHOL 88 01/03/2023   HDL 32 (L) 01/03/2023   LDLCALC 28 01/03/2023   TRIG 140 01/03/2023   CHOLHDL 2.8 01/03/2023    Medications Reviewed Today   Medications were not reviewed in this encounter       Assessment/Plan:   Diabetes: - Currently controlled - Reviewed long term cardiovascular and renal outcomes of uncontrolled blood sugar - Reviewed goal A1c, goal fasting, and goal 2 hour post prandial glucose -  Reviewed dietary modifications such as monitoring carbohydrate intake like pastas  - Recommend to continue current regimen. Per Dr. Anson Basta, it appears he need test strips yet patient denied refills as he states he has plenty.   - Patient denies personal or family history of multiple endocrine neoplasia type 2, medullary  thyroid  cancer; personal history of pancreatitis or gallbladder disease. - Recommend to check glucose at least once weekly (fasting) and patient reported that he would try 3-4 times week. He stated that he would first try once weekly on Thursday. Patient encouraged to record home BG readings and bring to clinic.       Hypertension: - Currently uncontrolled. Patient is followed cardiologist.  - Reviewed long term cardiovascular and renal outcomes of uncontrolled blood pressure - Reviewed appropriate blood pressure monitoring technique and reviewed goal blood pressure. Recommended to check home blood pressure and heart rate once weekly on Thursday with the goal to do three times week as he is more compliant. Record on paper as patient does not have smart phone.  - Discussed processed foods that has sodium  - Recommend to current regimen as it is being followed by cardiologist.       Hyperlipidemia/ASCVD Risk Reduction: - Currently controlled.  - Reviewed long term complications of uncontrolled cholesterol - Recommend to continue to take current medications     Follow Up Plan: telephone with PharmD in 4 weeks   Alexandria Angel, PharmD Clinical Pharmacist Cell: [phone]

## 2023-09-11 ENCOUNTER — Encounter (HOSPITAL_COMMUNITY): Payer: Self-pay | Admitting: Licensed Clinical Social Worker

## 2023-09-11 ENCOUNTER — Ambulatory Visit (INDEPENDENT_AMBULATORY_CARE_PROVIDER_SITE_OTHER): Admitting: Licensed Clinical Social Worker

## 2023-09-11 DIAGNOSIS — F339 Major depressive disorder, recurrent, unspecified: Secondary | ICD-10-CM | POA: Diagnosis not present

## 2023-09-11 NOTE — Progress Notes (Signed)
   THERAPIST PROGRESS NOTE  Session Time: 9:00am-9:55am  Participation Level: Active  Behavioral Response: NeatAlertEuthymic  Type of Therapy: Individual Therapy  Treatment Goals addressed:  "to cope with stress and manage depression and anxiety". Keifer will report improved stress management and coping skills, resulting in mood stability and less worrying 4 out of 7 days.    ProgressTowards Goals: Progressing  Interventions: Solution Focused  Summary: Blane Worthington is a 78 y.o. male who presents with MDD, chronic.   Suicidal/Homicidal: Nowithout intent/plan  Therapist Response: Tahmir engaged well in session with clinician. Clinician utilized Solution-Focused therapy to process updates in household, supports, finances, and health. Clinician noted the improvement in mood and progress due to support from a new care worker. Clinician identified the importance of having someone come into the home to assess for safety, to assist with budgeting, and to work with insurance for lower rates. Rafi shared updates and noted the usefulness of this service.   Plan: Return again in 2 weeks.  Diagnosis: Major depression, recurrent, chronic (HCC)  Collaboration of Care: Psychiatrist AEB provided updates to Dr. Levie Ream  Patient/Guardian was advised Release of Information must be obtained prior to any record release in order to collaborate their care with an outside provider. Patient/Guardian was advised if they have not already done so to contact the registration department to sign all necessary forms in order for us  to release information regarding their care.   Consent: Patient/Guardian gives verbal consent for treatment and assignment of benefits for services provided during this visit. Patient/Guardian expressed understanding and agreed to proceed.   Merleen Stare [place], LCSW 09/11/2023

## 2023-09-12 ENCOUNTER — Ambulatory Visit: Admitting: Physical Therapy

## 2023-09-12 ENCOUNTER — Encounter: Payer: Self-pay | Admitting: Physical Therapy

## 2023-09-12 DIAGNOSIS — M25511 Pain in right shoulder: Secondary | ICD-10-CM | POA: Diagnosis not present

## 2023-09-12 DIAGNOSIS — G8929 Other chronic pain: Secondary | ICD-10-CM

## 2023-09-12 DIAGNOSIS — R293 Abnormal posture: Secondary | ICD-10-CM

## 2023-09-12 DIAGNOSIS — M6281 Muscle weakness (generalized): Secondary | ICD-10-CM

## 2023-09-12 NOTE — Therapy (Signed)
 OUTPATIENT PHYSICAL THERAPY TREATMENT   Patient Name: Rodney Friedman MRN: [id number] DOB:26-Jul-1945, 78 y.o., male Today's Date: 09/12/2023  END OF SESSION:  PT End of Session - 09/12/23 0935     Visit Number 6    Number of Visits 17    Date for PT Re-Evaluation 10/03/23    Progress Note Due on Visit 10    PT Start Time 0930    PT Stop Time 1012    PT Time Calculation (min) 42 min    Equipment Utilized During Treatment Gait belt    Activity Tolerance Patient tolerated treatment well    Behavior During Therapy WFL for tasks assessed/performed                 Past Medical History:  Diagnosis Date   Adenomatous colon polyp    Alcoholism (HCC)    Anxiety    Atrial fibrillation (HCC)    CAD (coronary artery disease)    Cataract    removed left eye    CHF (congestive heart failure) (HCC) 12/16/2020   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 Abel Hoe & Dr. Sherene Dilling   Sleep apnea    Stroke [facility])    Substance abuse (HCC)    Tremor, essential 09/22/2015   Past Surgical History:  Procedure Laterality Date   Arm Surgery Right    from door accident with glass   CARDIAC CATHETERIZATION     CATARACT  EXTRACTION W/ INTRAOCULAR LENS IMPLANT Bilateral    COLONOSCOPY     CORONARY ARTERY BYPASS GRAFT  08/04/2004   EYE SURGERY     POLYPECTOMY     RIGHT/LEFT HEART CATH AND CORONARY/GRAFT ANGIOGRAPHY N/A 01/20/2021   Procedure: RIGHT/LEFT HEART CATH AND CORONARY/GRAFT ANGIOGRAPHY;  Surgeon: Odie Benne, MD;  Location: [facility] INVASIVE CV LAB;  Service: Cardiovascular;  Laterality: N/A;   TEE WITHOUT CARDIOVERSION N/A 02/08/2021   Procedure: TRANSESOPHAGEAL ECHOCARDIOGRAM (TEE);  Surgeon: Odie Benne, MD;  Location: [facility] INVASIVE CV LAB;  Service: Open Heart Surgery;  Laterality: N/A;   TRANSCATHETER AORTIC VALVE REPLACEMENT, CAROTID Left 02/08/2021   Procedure: TRANSCATHETER AORTIC VALVE REPLACEMENT, LEFT CAROTID;  Surgeon: Odie Benne, MD;  Location: [facility] INVASIVE CV LAB;  Service: Open Heart Surgery;  Laterality: Left;   Patient Active Problem List   Diagnosis Date Noted   Acute pain of right knee 06/11/2023   Fall 06/11/2023   Need for influenza vaccination 01/25/2023   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   Acute CVA (cerebrovascular accident) (HCC) 01/03/2023   Acute ischemic stroke (HCC) 01/02/2023   Diverticulosis  10/12/2022   Health care maintenance 02/23/2022   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   Chronic pain 03/14/2019   Essential hypertension 07/27/2018   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   Diabetes mellitus (HCC) 04/21/2016   OSA (obstructive sleep apnea) 04/21/2016   Quality of life palliative care encounter 01/03/2016   Tremor, essential 09/22/2015   Glaucoma, open angle 06/16/2015   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   Pseudophakia 06/15/2015   CD (contact dermatitis) 04/29/2014   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   Hematuria 01/05/2014   Skin lesion 01/05/2014   Severe aortic stenosis 01/03/2014   Hypertensive heart disease 01/03/2014   Mixed hyperlipidemia 01/03/2014   Mild  obesity 01/03/2014   Chronic atrial fibrillation (HCC) 12/31/2013   Long term (current) use of anticoagulants 12/31/2013   Cataract, nuclear 10/06/2013   Dermatochalasis of eyelid 10/06/2013   Chorioretinal scar, 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/03/2013   Pre-diabetes 09/03/2013   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  Rationale for Evaluation and Treatment: Rehabilitation  ONSET DATE: Chronic  SUBJECTIVE:                                                                                                                                                                                      SUBJECTIVE STATEMENT: 09/12/2023 "I have a lot of appointments and feel like I am making great progress and would like to come to one more visit."  PERTINENT HISTORY: CVA, DM II, HTN  PAIN:  Are you having pain?  Yes: NPRS scale: 0/10 Worst: 6/10 Pain location: anterior shoulder R>L Pain description: sharp, clicking  Aggravating factors: reaching OH, lifting Relieving factors: none  PRECAUTIONS: Fall  RED FLAGS: None   WEIGHT BEARING RESTRICTIONS: No  FALLS:  Has patient fallen in last 6 months? Yes. Number of falls 3 - mechanical falls while at home  LIVING ENVIRONMENT: Lives with: lives alone Lives in: House/apartment  OCCUPATION: Retired  PLOF: Independent and lives alone  PATIENT GOALS: Pt wants to decrease shoulder pain and discomfort in order to improve comfort with home ADLs and yard work  NEXT MD VISIT: Not currently scheduled  OBJECTIVE:  Note: Objective measures were completed at Evaluation unless otherwise noted.  DIAGNOSTIC FINDINGS:   None  PATIENT SURVEYS:  Quick DASH: 34% disability  COGNITION: Overall cognitive status: Within  functional limits for tasks assessed     SENSATION: WFL  POSTURE: Rounded shoulders, forward head, R AC joint piano key  UPPER EXTREMITY ROM:   Active ROM Right eval Left eval  Shoulder flexion 88 108  Shoulder extension    Shoulder abduction 85 120  Shoulder adduction    Shoulder internal rotation L5 L2  Shoulder external rotation 40 30  Elbow flexion    Elbow extension    Wrist flexion    Wrist extension    Wrist ulnar deviation    Wrist radial deviation    Wrist pronation    Wrist supination    (Blank rows = not tested)  UPPER EXTREMITY MMT:  MMT Right eval Left eval  Shoulder flexion    Shoulder extension    Shoulder abduction    Shoulder adduction    Shoulder internal rotation 4/5 4/5  Shoulder external rotation 3+/5 4/5  Middle trapezius    Lower trapezius    Elbow flexion    Elbow extension    Wrist flexion    Wrist extension    Wrist ulnar deviation    Wrist radial deviation    Wrist pronation    Wrist supination    Grip strength (lbs)    (Blank rows = not tested)  SHOULDER SPECIAL TESTS: Impingement tests: Painful arc test: positive  SLAP lesions: DNT Instability tests: DNT Rotator cuff assessment: DNT Biceps assessment: DNT  PALPATION:  TTP to R infraspinatus                                                                                                                           TREATMENT: [facility] Adult PT Treatment:                                                DATE: 09/12/23 UE Ranger Flexion / scaption with 3# going to fatigue plus 2 reps  Seated rows 2 x to fatigue with Blue theraband Seated Bil scapular retraction with ER 2 x going to fatigue  with blue theraband Seated Bil should IR 2 x to fatigue with Blue theraband  Seated bicep curl with Blue theraband single arm to fatigue  [facility] Adult PT Treatment:                                                 DATE: 09/05/23 Supine shoulder flexion 1x10 1#; 1x10 2# Supine horizontal abd 2x10 GTB Standing ER/ IR bil 3x10 with GTB Standing shoulder rows 2 x 20 with GTB Inclined table slide 2x15 each shoulder flexion Seated bilateral ER 2x10 YTB  [facility] Adult PT Treatment:  DATE: 08/29/23 Standing ER/ IR bil 2 x 12 with GTB Standing shoulder rows 2 x 20 with GTB Standing shoulder rows 2 x 20 with GTB Supine chest press with plus bil UE with dowel with 5#  [facility] Adult PT Treatment:                                                DATE: 08/22/23 Standing rows 2 x 15 with GTB Standing shoulder extension 2 x 15 GTB Standing bil shoulder ER/IR 1 x 12 with GTB Seated scaption bil with 1# Overhead pressure with 1# bil, 2 x 10   PATIENT EDUCATION: Education details: HEP update Person educated: Patient Education method: Explanation, Demonstration, and Handouts Education comprehension: verbalized understanding and returned demonstration  HOME EXERCISE PROGRAM: Access Code: [email or web address] URL: [email or web address] Date: 08/22/2023 Prepared by: Laron Plummer  Exercises - Standing Isometric Shoulder External Rotation with Doorway and Towel Roll  - 1-2 x daily - 7 x weekly - 3 sets - 10 reps - 5 sec hold - Standing Isometric Shoulder Internal Rotation with Towel Roll at Doorway  - 1-2 x daily - 7 x weekly - 3 sets - 10 reps - 5 sec hold - Shoulder External Rotation and Scapular Retraction with Resistance  - 1-2 x daily - 7 x weekly - 3 sets - 10 reps - yellow band hold - Seated Scapular Retraction  - 1-2 x daily - 7 x weekly - 3 sets - 10 reps - 3 sec hold - Seated Overhead Press  - 1 x daily - 7 x weekly - 2 sets - 10 reps - Standing Shoulder Row with Anchored Resistance  - 1 x daily - 7 x weekly - 2 sets - 10 reps - Shoulder External Rotation  - 1 x daily - 7 x weekly - 2 sets - 10 reps - Shoulder Internal Rotation  - 1 x daily - 7  x weekly - 2 sets - 10 reps  ASSESSMENT:  CLINICAL IMPRESSION: 09/12/2023 Abrahan arrives to PT today noting he is making progress and feels he is ready to discharge but would like to come to one more visit, due to having multiple other MD visits. Focused today on continued gross shoulder strengthening and scapular stability working into endurance. Next session plan to review/ update HEP/ goals and plan to discharge.    Evaluation Patient is a 78 y.o. m who was seen today for physical therapy evaluation and treatment for chronic bilateral shoulder pain R>L. Physical findings are consistent with MD impression as pt demonstrates decrease in bilateral shoulder strength and ROM. Quick DASH score demonstrates moderate disability in performance of home ADLs and community activities. Pt would benefit from skilled PT services working on improving RTC and periscapular strength in order to improve dynamic stability and decrease shoulder pain.   OBJECTIVE IMPAIRMENTS: decreased activity tolerance, decreased balance, decreased endurance, difficulty walking, decreased ROM, decreased strength, impaired UE functional use, postural dysfunction, and pain   ACTIVITY LIMITATIONS: carrying, lifting, standing, squatting, stairs, transfers, reach over head, and hygiene/grooming  PARTICIPATION LIMITATIONS: meal prep, cleaning, driving, community activity, occupation, and yard work  PERSONAL FACTORS: Time since onset of injury/illness/exacerbation and 3+ comorbidities: CVA, DM II, HTN  are also affecting patient's functional outcome.   REHAB POTENTIAL: Good  CLINICAL DECISION MAKING: Stable/uncomplicated  EVALUATION COMPLEXITY: Moderate   GOALS: Goals reviewed with  patient? No  SHORT TERM GOALS: Target date: 08/29/2023   Pt will be compliant and knowledgeable with initial HEP for improved comfort and carryover Baseline: initial HEP given  Goal status: Met 08/29/2023  2.  Pt will self report bilateral shoulder  pain no greater than 4/10 for improved comfort and functional ability Baseline: 6/10 at worst Goal status: Met 08/29/2023   LONG TERM GOALS: Target date: 10/03/2023   Pt will decrease Quick DASH disability score to no greater than 20% as proxy for functional improvement Baseline: 34% disability  Goal status: INITIAL  2.  Pt will self report right shoulder pain no greater than 1-2/10 for improved comfort and functional ability Baseline: 6/10 at worst Goal status: INITIAL   3.  Pt will improve bilateral shoulder flexion ROM to no less than 130 degrees for improved functional ability reaching into cabinets and other ADLs Baseline: see ROM chart Goal status: INITIAL  4.  Pt will improve bilateral shoulder IR/ER MMT to no less than 5/5 for improved dynamic stability and decrease pain Baseline: check MMT chart Goal status: INITIAL  5.  Pt will improve bilateral IR to at least T10 for improved ability to perform self care and hygiene activities  Baseline: see ROM chart Goal status: INITIAL  PLAN:  PT FREQUENCY: 1x/week  PT DURATION: 8 weeks  PLANNED INTERVENTIONS: 97164- PT Re-evaluation, 97110-Therapeutic exercises, 97530- Therapeutic activity, [id number]- Neuromuscular re-education, 97535- Self Care, 16109- Manual therapy, G0283- Electrical stimulation (unattended), [id number]- Electrical stimulation (manual), 97016- Vasopneumatic device, Dry Needling, Cryotherapy, and Moist heat  PLAN FOR NEXT SESSION: assess HEP response,  Gait belt due to fall risk.    Euretha Najarro PT, DPT, LAT, ATC  09/12/23  10:08 AM

## 2023-09-18 ENCOUNTER — Encounter

## 2023-09-19 ENCOUNTER — Other Ambulatory Visit: Payer: Self-pay

## 2023-09-19 NOTE — Patient Outreach (Signed)
 Complex Care Management   Visit Note  09/19/2023  Name:  Rodney Friedman MRN: [id number] DOB: 03/28/1946  Situation: Referral received for Complex Care Management related to Diabetes with Complications and HTN  I obtained verbal consent from Patient.  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 Abel Hoe & Dr. Sherene Dilling   Sleep apnea    Stroke [facility])    Substance abuse (HCC)    Tremor, essential 09/22/2015    Assessment: Patient Reported Symptoms:  Cognitive Cognitive Status: Able to follow simple commands, Alert and oriented to person, place, and time, Normal speech and language skills Cognitive/Intellectual Conditions Management [RPT]: None reported or documented in medical history or problem list      Neurological Neurological Review of Symptoms: No symptoms reported Neurological Conditions: Stroke,  ischemic (History of stroke 12/2022 per chart review) Neurological Management Strategies: Medication therapy, Exercise  HEENT HEENT Symptoms Reported: No symptoms reported HEENT Conditions: Vision problem(s) Vision Problems: blindness/vision loss, cataract(s), glaucoma (Legally blind R eye) HEENT Management Strategies: Medical device (reading glasses) Vision problem(s)  Cardiovascular Cardiovascular Symptoms Reported: Chest pain or discomfort (Chest discomfort with exertion) Does patient have uncontrolled Hypertension?: Yes Is patient checking Blood Pressure at home?: No (Patient states "I'm not going to get an accurate reading because I'm living in a state of constant rage." He does have a BP cuff but reports he struggles to get it on by himself.) Cardiovascular Conditions: Coronary artery disease, Dysrhythmia, Hypertension, High blood cholesterol Cardiovascular Management Strategies: Medication therapy, Exercise, Routine screening Cardiovascular Comment: Advised patient to ask Danielle (caregiver) to asssit with checking BP at home  Respiratory Respiratory Symptoms Reported: Shortness of breath (SOB with exertion) Respiratory Conditions: Sleep disordered breathing (does not wear CPAP)  Endocrine Patient reports the following symptoms related to hypoglycemia or hyperglycemia : No symptoms reported Is patient diabetic?: Yes Is patient checking blood sugars at home?: No Endocrine Conditions: Diabetes Endocrine Management Strategies: Medication therapy, Exercise  Gastrointestinal Gastrointestinal Symptoms Reported: No symptoms reported (Last BM today) Additional Gastrointestinal Details: Patient reports a "terrible" appetite d/t stress and difficulty cooking for one person. He tries to have an assortment of nutritious foods. He has intentionally lost 13 lbs. Gastrointestinal  Conditions: Other Other Gastrointestinal Conditions: Diverticulosis Gastrointestinal Management Strategies: Medication  therapy (stool softener) Nutrition Risk Screen (CP): No indicators present  Genitourinary Genitourinary Symptoms Reported: Frequency, Other Other Genitourinary Symptoms: Patient reports frequent, small amounts of urine (baseline) Genitourinary Conditions: Prostate cancer (Patient reports that he had lab work done recently by urology that has shown prostate CA has metastized, although they are unsure where. The plan right now is to watch and wait. Nothing seen in chart review.) Genitourinary Management Strategies: Incontinence garment/pad (Uses about 2 pads a day. Routine screening by urology)  Integumentary Integumentary Symptoms Reported: No symptoms reported    Musculoskeletal Musculoskelatal Symptoms Reviewed: No symptoms reported Musculoskeletal Conditions: Other, Mobility limited Other Musculoskeletal Conditions: Mobility limited in R shoulder. Chronic issues with his shoulder, surgery not indicated per pt Musculoskeletal Management Strategies: Exercise, Medication therapy Musculoskeletal Comment: Patient is finishing outpatient PT next week (09/25/23). He reports feeling stronger. He has home exercises that he plans to continue. Falls in the past year?: Yes Number of falls in past year: 2 or more Was there an injury with Fall?: No Fall Risk Category Calculator: 2 Patient Fall Risk Level: Moderate Fall Risk Patient at Risk for Falls Due to: History of fall(s), Medication side effect Fall risk Follow up: Falls evaluation completed, Education provided  Psychosocial Psychosocial Symptoms Reported: Depression - if selected complete PHQ 2-9, Anxiety - if selected complete GAD Behavioral Health Conditions:  (None listed in problem list, but patient does report seeing counselor/psych and is on medication) Behavioral Management Strategies: Abstinence from substances, Counseling, Medication therapy Major Change/Loss/Stressor/Fears (CP): Medical condition, self Behaviors When Feeling  Stressed/Fearful: Anger Techniques to Cope with Loss/Stress/Change: Diversional activities Theatre manager, drawing, blog) Quality of Family Relationships: supportive, involved (Daughter lives in [place] , son lives locally) Do you feel physically threatened by others?: No      09/19/2023   10:59 AM  Depression screen PHQ 2/9  Decreased Interest 0  Down, Depressed, Hopeless 3  PHQ - 2 Score 3  Altered sleeping 0  Tired, decreased energy 0  Change in appetite 3  Feeling bad or failure about yourself  0  Trouble concentrating 0  Moving slowly or fidgety/restless 0  Suicidal thoughts 0  PHQ-9 Score 6    There were no vitals filed for this visit.  Medications Reviewed Today     Reviewed by Valaria Garland, RN (Registered Nurse) on 09/19/23 at 1044  Med List Status: <None>   Medication Order Taking? Sig Documenting Provider Last Dose Status Informant  acetaminophen  (TYLENOL ) 325 MG tablet [id number]  Take 1-2 tablets (325-650 mg total) by mouth every 4 (four) hours as needed for mild pain. Setzer, Hamp Levine, PA-C  Active   apixaban  (ELIQUIS ) 5 MG TABS tablet [id number]  Take 1 tablet (5 mg total) by mouth 2 (two) times daily. Jerrlyn Morel, NP  Active   buPROPion  (WELLBUTRIN  SR) 200 MG 12 hr tablet [id number]  Take 1 tablet (200 mg total) by mouth every morning. Alba Ally, MD  Active   buPROPion  ER (WELLBUTRIN  SR) 100 MG 12 hr tablet [id number]  1 qhs Plovsky, Gerald, MD  Active   calcium  carbonate (TUMS - DOSED IN MG ELEMENTAL CALCIUM ) 500 MG chewable tablet [id number]  Chew 1 tablet by mouth 2 (two) times daily. [provider]  Active            Med Note Vallarie Gauze, [facility]   Thu Aug 09, 2023 12:36 PM) Taking prn   cyanocobalamin  (VITAMIN B12) 500 MCG  tablet [id number]  Take 1 tablet (500 mcg total) by mouth every other day. Jerrlyn Morel, NP  Active   Denosumab [facility]) [id number]   [provider]  Active   docusate sodium  (COLACE) 100 MG capsule  [id number]  Take 1 capsule (100 mg total) by mouth 2 (two) times daily. Jerrlyn Morel, NP  Active   ezetimibe  (ZETIA ) 10 MG tablet [id number]  Take 1 tablet (10 mg total) by mouth daily. Jerrlyn Morel, NP  Active   gabapentin  (NEURONTIN ) 300 MG capsule [id number]  TAKE 1 CAPSULE BY MOUTH EVERYDAY AT BEDTIME Nichols, Tonya S, NP  Active   Glucose Blood (BLOOD GLUCOSE TEST STRIPS) STRP [id number]  Inject 1 each into the skin 2 (two) times daily. May substitute to any manufacturer covered by patient's insurance. Dx code E11.69 Jerrlyn Morel, NP  Active   Lancets Misc. MISC [id number]  Inject 1 each into the skin 2 (two) times daily. May substitute to any manufacturer covered by patient's insurance. Dx code E11.69 Jerrlyn Morel, NP  Active   latanoprost  (XALATAN ) 0.005 % ophthalmic solution [id number]  Place 1 drop into the left eye every other day. [provider]  Active Self           Med Note Mliss Anderson [facility] A   Wed Jan 03, 2023  2:36 PM) Patient states that he needs a refill.  magnesium  oxide (MAG-OX) 400 (240 Mg) MG tablet [id number]  Take 400 mg by mouth daily. [provider]  Active   metFORMIN  (GLUCOPHAGE ) 500 MG tablet [id number]  Take 1 tablet (500 mg total) by mouth 2 (two) times daily with a meal. Jerrlyn Morel, NP  Active   metoprolol  succinate (TOPROL -XL) 50 MG 24 hr tablet [id number]  Take 1 tablet (50 mg total) by mouth 2 (two) times daily. Take with or immediately following a meal. Jerrlyn Morel, NP  Active   rosuvastatin  (CRESTOR ) 20 MG tablet [id number]  Take 1 tablet (20 mg total) by mouth daily. Jerrlyn Morel, NP  Active             Recommendation:   Ask your caregiver to check you blood pressure and keep a log of readings. Bring your log to future doctor appointments. Keep appointments with your counselor/psych to manage feelings of depression and anxiety.  Follow Up Plan:   Telephone follow up appointment date/time:  10/19/23 at 10 AM  with Traci Stefani Edin, RN MSN [place]  [facility] RN Care Manager Direct Dial: [phone]  Fax: [phone]

## 2023-09-19 NOTE — Patient Instructions (Signed)
 Visit Information  Thank you for taking time to visit with me today. Please don't hesitate to contact me if I can be of assistance to you before our next scheduled appointment.  Your next care management appointment is by telephone on 10/19/23 at 10 AM with Augustin Leber   Please call the care guide team at [phone] if you need to cancel, schedule, or reschedule an appointment.   Please call the Suicide and Crisis Lifeline: 988 call 1-800-273-TALK (toll free, 24 hour hotline) if you are experiencing a Mental Health or Behavioral Health Crisis or need someone to talk to.  Theodora Fish, RN MSN [place]  [facility] Population Health RN Care Manager Direct Dial: [phone]  Fax: [phone]  How to Take Your Blood Pressure Blood pressure measures how strongly your blood is pressing against the walls of your arteries. Arteries are blood vessels that carry blood from your heart throughout your body. You can take your blood pressure at home with a machine. You may need to check your blood pressure at home: To check if you have high blood pressure (hypertension). To check your blood pressure over time. To make sure your blood pressure medicine is working. Supplies needed: Blood pressure machine, or monitor. A chair to sit in. This should be a chair where you can sit upright with your back supported. Do not sit on a soft couch or an armchair. Table or desk. Small notebook. Pencil or pen. How to prepare Avoid these things for 30 minutes before checking your blood pressure: Having drinks with caffeine in them, such as coffee or tea. Drinking alcohol . Eating. Smoking. Exercising. Do these things five minutes before checking your blood pressure: Go to the bathroom and pee (urinate). Sit in a chair. Be quiet. Do not talk. How to take your blood pressure Follow the instructions that came with your machine. If you have a digital blood pressure monitor, these may be the  instructions: Sit up straight. Place your feet on the floor. Do not cross your ankles or legs. Rest your left arm at the level of your heart. You may rest it on a table, desk, or chair. Pull up your shirt sleeve. Wrap the blood pressure cuff around the upper part of your left arm. The cuff should be 1 inch (2.5 cm) above your elbow. It is best to wrap the cuff around bare skin. Fit the cuff snugly around your arm, but not too tightly. You should be able to place only one finger between the cuff and your arm. Place the cord so that it rests in the bend of your elbow. Press the power button. Sit quietly while the cuff fills with air and loses air. Write down the numbers on the screen. Wait 2-3 minutes and then repeat steps 1-10. What do the numbers mean? Two numbers make up your blood pressure. The first number is called systolic pressure. The second is called diastolic pressure. An example of a blood pressure reading is "120 over 80" (or 120/80). If you are an adult and do not have a medical condition, use this guide to find out if your blood pressure is normal: Normal First number: below 120. Second number: below 80. Elevated First number: 120-129. Second number: below 80. Hypertension stage 1 First number: 130-139. Second number: 80-89. Hypertension stage 2 First number: 140 or above. Second number: 90 or above. Your blood pressure is above normal even if only the first or only the second number is above normal. Follow these instructions at  home: Medicines Take over-the-counter and prescription medicines only as told by your doctor. Tell your doctor if your medicine is causing side effects. General instructions Check your blood pressure as often as your doctor tells you to. Check your blood pressure at the same time every day. Take your monitor to your next doctor's appointment. Your doctor will: Make sure you are using it correctly. Make sure it is working right. Understand  what your blood pressure numbers should be. Keep all follow-up visits. General tips You will need a blood pressure machine or monitor. Your doctor can suggest a monitor. You can buy one at a drugstore or online. When choosing one: Choose one with an arm cuff. Choose one that wraps around your upper arm. Only one finger should fit between your arm and the cuff. Do not choose one that measures your blood pressure from your wrist or finger. Where to find more information American Heart Association: www.heart.org Contact a doctor if: Your blood pressure keeps being high. Your blood pressure is suddenly low. Get help right away if: Your first blood pressure number is higher than 180. Your second blood pressure number is higher than 120. These symptoms may be an emergency. Do not wait to see if the symptoms will go away. Get help right away. Call 911. Summary Check your blood pressure at the same time every day. Avoid caffeine, alcohol , smoking, and exercise for 30 minutes before checking your blood pressure. Make sure you understand what your blood pressure numbers should be. This information is not intended to replace advice given to you by your health care provider. Make sure you discuss any questions you have with your health care provider. Document Revised: 01/20/2021 Document Reviewed: 01/20/2021 Elsevier Patient Education  2024 [employer].

## 2023-09-25 ENCOUNTER — Ambulatory Visit (INDEPENDENT_AMBULATORY_CARE_PROVIDER_SITE_OTHER): Admitting: Licensed Clinical Social Worker

## 2023-09-25 ENCOUNTER — Encounter (HOSPITAL_COMMUNITY): Payer: Self-pay | Admitting: Licensed Clinical Social Worker

## 2023-09-25 DIAGNOSIS — F339 Major depressive disorder, recurrent, unspecified: Secondary | ICD-10-CM

## 2023-09-25 NOTE — Progress Notes (Signed)
   THERAPIST PROGRESS NOTE  Session Time: 10:00am-10:55am  Participation Level: Active  Behavioral Response: Well GroomedAlertEuthymic  Type of Therapy: Individual Therapy  Treatment Goals addressed: "to cope with stress and manage depression and anxiety". [name] will report improved stress management and coping skills, resulting in mood stability and less worrying 4 out of 7 days.    ProgressTowards Goals: Progressing  Interventions: Motivational Interviewing  Summary: Rodney Friedman is a 77 y.o. male who presents with MDD, recurrent, chronic.   Suicidal/Homicidal: Nowithout intent/plan  Therapist Response: [name] engaged well in individual in person session with clinician. Clinician utilized MI OARS to reflect and summarize thoughts and feelings. Clinician provided time and space for [name] to share positive updates in house, including some purchases that were not "in the budget", but that are going to be positive. Clinician explored [name] experience with new support person through [employer]. [name] shared that he is pleased to have another support person who can help in the house, driving to appointments, and with logistical assistance in dealing with medicare, cable company, and other organizations. [name] shared daily frustrations regarding his physical health and reduced capabilities. He also shared challenges due to not having supportive family members that he can rely on. Clinician reviewed coping skills, positive self care, and ongoing ability to care for himself.   Plan: Return again in 2 weeks.  Diagnosis: Major depression, recurrent, chronic (HCC)  Collaboration of Care: Psychiatrist AEB provided update to Dr. Levie Ream  Patient/Guardian was advised Release of Information must be obtained prior to any record release in order to collaborate their care with an outside provider. Patient/Guardian was advised if they have not already done so to contact the registration department to  sign all necessary forms in order for us  to release information regarding their care.   Consent: Patient/Guardian gives verbal consent for treatment and assignment of benefits for services provided during this visit. Patient/Guardian expressed understanding and agreed to proceed.   Merleen Stare [place], LCSW 09/25/2023

## 2023-09-26 ENCOUNTER — Ambulatory Visit: Attending: Nurse Practitioner | Admitting: Physical Therapy

## 2023-09-26 ENCOUNTER — Encounter: Payer: Self-pay | Admitting: Physical Therapy

## 2023-09-26 DIAGNOSIS — M25511 Pain in right shoulder: Secondary | ICD-10-CM | POA: Diagnosis present

## 2023-09-26 DIAGNOSIS — R293 Abnormal posture: Secondary | ICD-10-CM | POA: Diagnosis present

## 2023-09-26 DIAGNOSIS — G8929 Other chronic pain: Secondary | ICD-10-CM | POA: Diagnosis present

## 2023-09-26 DIAGNOSIS — M6281 Muscle weakness (generalized): Secondary | ICD-10-CM | POA: Diagnosis present

## 2023-09-26 NOTE — Therapy (Signed)
 OUTPATIENT PHYSICAL THERAPY TREATMENT PHYSICAL THERAPY DISCHARGE SUMMARY  Visits from Start of Care: 7  Current functional level related to goals / functional outcomes: See goals, QuickDash 15.7%   Remaining deficits: See note   Education / Equipment: HEP, theraband, posture    Patient agrees to discharge. Patient goals were partially met. Patient is being discharged due to being pleased with the current functional level.    Patient Name: Rodney Friedman MRN: [id number] DOB:Nov 02, 1945, 78 y.o., male Today's Date: 09/26/2023  END OF SESSION:  PT End of Session - 09/26/23 0930     Visit Number 7    Number of Visits 17    Date for PT Re-Evaluation 10/03/23    Authorization Type Medicare    Progress Note Due on Visit 10    PT Start Time 0931    PT Stop Time 1015    PT Time Calculation (min) 44 min    Equipment Utilized During Treatment Gait belt    Activity Tolerance Patient tolerated treatment well    Behavior During Therapy WFL for tasks assessed/performed                  Past Medical History:  Diagnosis Date   Adenomatous colon polyp    Alcoholism (HCC)    Anxiety    Atrial fibrillation (HCC)    CAD (coronary artery disease)    Cataract    removed left eye    CHF (congestive heart failure) (HCC) 12/16/2020   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 Abel Hoe & Dr. Sherene Dilling   Sleep apnea    Stroke [facility])    Substance abuse (HCC)    Tremor, essential 09/22/2015   Past Surgical History:  Procedure Laterality Date   Arm Surgery Right    from door accident with glass   CARDIAC CATHETERIZATION     CATARACT EXTRACTION W/ INTRAOCULAR LENS IMPLANT Bilateral    COLONOSCOPY     CORONARY ARTERY BYPASS GRAFT  08/04/2004   EYE SURGERY     POLYPECTOMY     RIGHT/LEFT HEART CATH AND CORONARY/GRAFT ANGIOGRAPHY N/A 01/20/2021   Procedure: RIGHT/LEFT HEART CATH AND CORONARY/GRAFT ANGIOGRAPHY;  Surgeon: Odie Benne, MD;  Location: [facility] INVASIVE CV LAB;  Service: Cardiovascular;  Laterality: N/A;   TEE WITHOUT CARDIOVERSION N/A 02/08/2021   Procedure: TRANSESOPHAGEAL ECHOCARDIOGRAM (TEE);  Surgeon: Odie Benne, MD;  Location: [facility] INVASIVE CV LAB;  Service: Open Heart Surgery;  Laterality: N/A;   TRANSCATHETER AORTIC VALVE REPLACEMENT, CAROTID Left 02/08/2021   Procedure: TRANSCATHETER AORTIC VALVE REPLACEMENT, LEFT CAROTID;  Surgeon:  Odie Benne, MD;  Location: [facility] INVASIVE CV LAB;  Service: Open Heart Surgery;  Laterality: Left;   Patient Active Problem List   Diagnosis Date Noted   Acute pain of right knee 06/11/2023   Fall 06/11/2023   Need for influenza vaccination 01/25/2023   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   Acute CVA (cerebrovascular accident) (HCC) 01/03/2023   Acute ischemic stroke (HCC) 01/02/2023   Diverticulosis 10/12/2022   Health care maintenance 02/23/2022   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   Chronic pain 03/14/2019   Essential hypertension 07/27/2018   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   Diabetes mellitus (HCC) 04/21/2016   OSA (obstructive sleep apnea) 04/21/2016   Quality of  life palliative care encounter 01/03/2016   Tremor, essential 09/22/2015   Glaucoma, open angle 06/16/2015   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   Pseudophakia 06/15/2015   CD (contact dermatitis) 04/29/2014   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   Hematuria 01/05/2014   Skin lesion 01/05/2014   Severe aortic stenosis 01/03/2014   Hypertensive heart disease 01/03/2014   Mixed hyperlipidemia 01/03/2014   Mild obesity 01/03/2014   Chronic atrial fibrillation (HCC) 12/31/2013   Long term (current) use of anticoagulants 12/31/2013   Cataract, nuclear 10/06/2013   Dermatochalasis of eyelid 10/06/2013   Chorioretinal scar, 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/03/2013   Pre-diabetes 09/03/2013   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   Prostate cancer (HCC) 07/08/2013    PCP: Jerrlyn Morel, NP  REFERRING PROVIDER: Jerrlyn Morel, NP  REFERRING DIAG: M25.511,G89.29,M25.512 (ICD-10-CM) - Chronic pain of both shoulders   THERAPY DIAG:  Chronic right shoulder pain  Muscle weakness (generalized)  Abnormal posture  Rationale for Evaluation and Treatment: Rehabilitation  ONSET DATE: Chronic  SUBJECTIVE:                                                                                                                                                                                      SUBJECTIVE STATEMENT: 09/26/2023 " I am doing okay doing the exercises, and doing the yard work with the hedge clippers and trimmer. I notice less clicking in the shoulder and it feels more stable."  PERTINENT HISTORY: CVA, DM II, HTN  PAIN:  Are you having pain?  Yes: NPRS scale: 0/10 Worst:0/10 Pain location: anterior shoulder R>L Pain description: sharp, clicking  Aggravating factors: reaching OH, lifting Relieving factors: none  PRECAUTIONS: Fall  RED FLAGS: None   WEIGHT BEARING  RESTRICTIONS: No  FALLS:  Has patient fallen in last 6 months? Yes. Number of falls  3 - mechanical falls while at home  LIVING ENVIRONMENT: Lives with: lives alone Lives in: House/apartment  OCCUPATION: Retired  PLOF: Independent and lives alone  PATIENT GOALS: Pt wants to decrease shoulder pain and discomfort in order to improve comfort with home ADLs and yard work  NEXT MD VISIT: Not currently scheduled  OBJECTIVE:  Note: Objective measures were completed at Evaluation unless otherwise noted.  DIAGNOSTIC FINDINGS:  None  PATIENT SURVEYS:  Quick DASH: 34% disability 09/26/23 15.7%  COGNITION: Overall cognitive status: Within functional limits for tasks assessed     SENSATION: WFL  POSTURE: Rounded shoulders, forward head, R AC joint piano key  UPPER EXTREMITY ROM:   Active ROM Right eval Left eval Right 09/26/23 Left 09/26/23  Shoulder flexion 88 108 113 130  Shoulder extension      Shoulder abduction 85 120    Shoulder adduction      Shoulder internal rotation L5 L2 T12 T12  Shoulder external rotation 40 30    Elbow flexion      Elbow extension      Wrist flexion      Wrist extension      Wrist ulnar deviation      Wrist radial deviation      Wrist pronation      Wrist supination      (Blank rows = not tested)  UPPER EXTREMITY MMT:  MMT Right eval Left eval Right  09/26/23  Left 5/7/*25  Shoulder flexion      Shoulder extension      Shoulder abduction      Shoulder adduction      Shoulder internal rotation 4/5 4/5 5/5 5/5  Shoulder external rotation 3+/5 4/5 4+/5 5/5  Middle trapezius      Lower trapezius      Elbow flexion      Elbow extension      Wrist flexion      Wrist extension      Wrist ulnar deviation      Wrist radial deviation      Wrist pronation      Wrist supination      Grip strength (lbs)      (Blank rows = not tested)  SHOULDER SPECIAL TESTS: Impingement tests: Painful arc test: positive  SLAP lesions: DNT Instability  tests: DNT Rotator cuff assessment: DNT Biceps assessment: DNT  PALPATION:  TTP to R infraspinatus                                                                                                                           TREATMENT: [facility] Adult PT Treatment:                                                DATE: 09/26/23 Reviewed goals/ ROM and functional strength assessment Updated/ extensively reviewed HEP  today   [facility] Adult PT Treatment:                                                DATE: 09/12/23 UE Ranger Flexion / scaption with 3# going to fatigue plus 2 reps  Seated rows 2 x to fatigue with Blue theraband Seated Bil scapular retraction with ER 2 x going to fatigue  with blue theraband Seated Bil should IR 2 x to fatigue with Blue theraband  Seated bicep curl with Blue theraband single arm to fatigue  [facility] Adult PT Treatment:                                                DATE: 09/05/23 Supine shoulder flexion 1x10 1#; 1x10 2# Supine horizontal abd 2x10 GTB Standing ER/ IR bil 3x10 with GTB Standing shoulder rows 2 x 20 with GTB Inclined table slide 2x15 each shoulder flexion Seated bilateral ER 2x10 YTB  PATIENT EDUCATION: Education details: HEP update Person educated: Patient Education method: Explanation, Demonstration, and Handouts Education comprehension: verbalized understanding and returned demonstration  HOME EXERCISE PROGRAM: Access Code: [email or web address] URL: [email or web address] Date: 09/26/2023 Prepared by: Laron Plummer  Program Notes As you continue to get strong you want to continue to add reps/ sets. once you can do 4 sets of 15-20 reps with no challenges, its time to progress to the next theraband resistance or increase the weight slightly.   Exercises - Standing Isometric Shoulder External Rotation with Doorway and Towel Roll  - 1-2 x daily - 7 x weekly - 3 sets - 10 reps - 5 sec hold - Standing Isometric Shoulder Internal Rotation with Towel  Roll at Doorway  - 1-2 x daily - 7 x weekly - 3 sets - 10 reps - 5 sec hold - Shoulder External Rotation and Scapular Retraction with Resistance  - 1-2 x daily - 7 x weekly - 3 sets - 10 reps - yellow band hold - Seated Scapular Retraction  - 1-2 x daily - 7 x weekly - 3 sets - 10 reps - 3 sec hold - Seated Overhead Press  - 1 x daily - 7 x weekly - 2 sets - 10 reps - Standing Shoulder Row with Anchored Resistance  - 1 x daily - 7 x weekly - 2 sets - 10 reps - Shoulder External Rotation  - 1 x daily - 7 x weekly - 2 sets - 10 reps - Shoulder Internal Rotation  - 1 x daily - 7 x weekly - 2 sets - 10 reps - Supine Shoulder Flexion Extension Full Range AROM  - 1 x daily - 7 x weekly - 3 sets - 10 reps - 2lbs hold - Lower Trap Wall slides  - 1 x daily - 7 x weekly - 2 sets - 10 reps - Wall Push Up with Plus  - 1 x daily - 7 x weekly - 2 sets - 10 reps - 5 hold  ASSESSMENT:  CLINICAL IMPRESSION: 09/26/2023 Jorda has made excellent progress with physical therapy increasing shoulder ROM, strength and overall function. He has met or partially met all goals today and increased his QuickDash to 15.7% limitation. Time was taken to review and update his HEP,  He is able to maintain and progress his current LOF IND and will be formally discharged today.    Evaluation Patient is a 78 y.o. m who was seen today for physical therapy evaluation and treatment for chronic bilateral shoulder pain R>L. Physical findings are consistent with MD impression as pt demonstrates decrease in bilateral shoulder strength and ROM. Quick DASH score demonstrates moderate disability in performance of home ADLs and community activities. Pt would benefit from skilled PT services working on improving RTC and periscapular strength in order to improve dynamic stability and decrease shoulder pain.   OBJECTIVE IMPAIRMENTS: decreased activity tolerance, decreased balance, decreased endurance, difficulty walking, decreased ROM, decreased  strength, impaired UE functional use, postural dysfunction, and pain   ACTIVITY LIMITATIONS: carrying, lifting, standing, squatting, stairs, transfers, reach over head, and hygiene/grooming  PARTICIPATION LIMITATIONS: meal prep, cleaning, driving, community activity, occupation, and yard work  PERSONAL FACTORS: Time since onset of injury/illness/exacerbation and 3+ comorbidities: CVA, DM II, HTN  are also affecting patient's functional outcome.   REHAB POTENTIAL: Good  CLINICAL DECISION MAKING: Stable/uncomplicated  EVALUATION COMPLEXITY: Moderate   GOALS: Goals reviewed with patient? No  SHORT TERM GOALS: Target date: 08/29/2023   Pt will be compliant and knowledgeable with initial HEP for improved comfort and carryover Baseline: initial HEP given  Goal status: Met 08/29/2023  2.  Pt will self report bilateral shoulder pain no greater than 4/10 for improved comfort and functional ability Baseline: 6/10 at worst Goal status: Met 08/29/2023   LONG TERM GOALS: Target date: 10/03/2023   Pt will decrease Quick DASH disability score to no greater than 20% as proxy for functional improvement Baseline: 34% disability  Goal status: MET 09/26/23  2.  Pt will self report right shoulder pain no greater than 1-2/10 for improved comfort and functional ability Baseline: 6/10 at worst Goal status: MET 09/26/2023   3.  Pt will improve bilateral shoulder flexion ROM to no less than 130 degrees for improved functional ability reaching into cabinets and other ADLs Baseline: see ROM chart Goal status: partially MET 09/26/23  4.  Pt will improve bilateral shoulder IR/ER MMT to no less than 5/5 for improved dynamic stability and decrease pain Baseline: check MMT chart Goal status: Partially Met 5/7/5  5.  Pt will improve bilateral IR to at least T10 for improved ability to perform self care and hygiene activities  Baseline: see ROM chart Goal status: Partially MET   PLAN:  PT FREQUENCY:  1x/week  PT DURATION: 8 weeks  PLANNED INTERVENTIONS: 97164- PT Re-evaluation, 97110-Therapeutic exercises, 97530- Therapeutic activity, [id number]- Neuromuscular re-education, 97535- Self Care, 96045- Manual therapy, [id number]- Electrical stimulation (unattended), [id number]- Electrical stimulation (manual), 97016- Vasopneumatic device, Dry Needling, Cryotherapy, and Moist heat  PLAN FOR NEXT SESSION: D/C   Deniqua Perry PT, DPT, LAT, ATC  09/26/23  11:16 AM

## 2023-10-09 ENCOUNTER — Ambulatory Visit (INDEPENDENT_AMBULATORY_CARE_PROVIDER_SITE_OTHER): Admitting: Licensed Clinical Social Worker

## 2023-10-09 DIAGNOSIS — F339 Major depressive disorder, recurrent, unspecified: Secondary | ICD-10-CM

## 2023-10-15 ENCOUNTER — Encounter (HOSPITAL_COMMUNITY): Payer: Self-pay | Admitting: Licensed Clinical Social Worker

## 2023-10-15 NOTE — Progress Notes (Signed)
   THERAPIST PROGRESS NOTE  Session Time: 9:00am-9:55am  Participation Level: Active  Behavioral Response: Well GroomedAlertEuthymic and Irritable  Type of Therapy: Individual Therapy  Treatment Goals addressed: "to cope with stress and manage depression and anxiety". [name] will report improved stress management and coping skills, resulting in mood stability and less worrying 4 out of 7 days.   ProgressTowards Goals: Progressing  Interventions: CBT  Summary: Rodney Friedman is a 78 y.o. male who presents with MDD, recurrent, chronic.   Suicidal/Homicidal: Nowithout intent/plan  Therapist Response: [name] engaged well in individual and person session with clinician.  Clinician utilized CBT to process thoughts feelings and interactions.  [name] shared updates in health and life, noting excitement about upcoming visit from daughter and grandchildren.  Clinician discussed the changes in their relationship over the years.  Clinician identified an increased understanding of what daughter went through as a child, and is no longer holding anger for her choices.  Clinician discussed new support person and progress made in the house and in Ohm's sense of being supported.  Clinician reflected increased smiling and improvement and positive attitude.  Plan: Return again in 2 weeks.  Diagnosis: Major depression, recurrent, chronic (HCC)  Collaboration of Care: Psychiatrist AEB updated Dr. Levie Ream  Patient/Guardian was advised Release of Information must be obtained prior to any record release in order to collaborate their care with an outside provider. Patient/Guardian was advised if they have not already done so to contact the registration department to sign all necessary forms in order for us  to release information regarding their care.   Consent: Patient/Guardian gives verbal consent for treatment and assignment of benefits for services provided during this visit. Patient/Guardian expressed  understanding and agreed to proceed.   Merleen Stare [place], LCSW 10/15/2023

## 2023-10-16 ENCOUNTER — Other Ambulatory Visit

## 2023-10-17 ENCOUNTER — Other Ambulatory Visit: Payer: Self-pay | Admitting: Nurse Practitioner

## 2023-10-17 MED ORDER — AMOXICILLIN 500 MG PO CAPS
500.0000 mg | ORAL_CAPSULE | Freq: Two times a day (BID) | ORAL | 0 refills | Status: AC
Start: 2023-10-17 — End: ?

## 2023-10-19 ENCOUNTER — Other Ambulatory Visit: Payer: Self-pay

## 2023-10-19 DIAGNOSIS — Z789 Other specified health status: Secondary | ICD-10-CM

## 2023-10-19 DIAGNOSIS — I482 Chronic atrial fibrillation, unspecified: Secondary | ICD-10-CM

## 2023-10-19 DIAGNOSIS — I639 Cerebral infarction, unspecified: Secondary | ICD-10-CM

## 2023-10-19 DIAGNOSIS — I350 Nonrheumatic aortic (valve) stenosis: Secondary | ICD-10-CM

## 2023-10-19 NOTE — Patient Instructions (Signed)
 Visit Information  Thank you for taking time to visit with me today. Please don't hesitate to contact me if I can be of assistance to you before our next scheduled appointment.  Our next appointment is Telephone follow up appointment with care management team member scheduled for:  11/13/23  930 am   Please call the care guide team at [phone] if you need to cancel or reschedule your appointment.   Following is a copy of your care plan:   Goals Addressed             This Visit's Progress    VBCI RN Care Plan       Problems:  Chronic Disease Management support and education needs related to HTN  Goal: Over the next 30 days the Patient will attend all scheduled medical appointments: with PCP and specialist as evidenced by keeping al scheduled appointments        demonstrate Ongoing adherence to prescribed treatment plan for HTN as evidenced by no admissions to the hospital verbalize basic understanding of HTN disease process and self health management plan as evidenced by verbal explanation lifestyle changes and consistent medication compliance   Interventions:   Hypertension Interventions: Last practice recorded BP readings:  BP Readings from Last 3 Encounters:  07/31/23 (!) 155/99  07/27/23 (!) 114/90  07/06/23 (!) 154/94   Most recent eGFR/CrCl:  Lab Results  Component Value Date   EGFR 63 04/26/2023    No components found for: "CRCL"  Evaluation of current treatment plan related to hypertension self management and patient's adherence to plan as established by provider Provided education to patient re: stroke prevention, s/s of heart attack and stroke Reviewed medications with patient and discussed importance of compliance Counseled on the importance of exercise goals with target of 150 minutes per week Discussed plans with patient for ongoing care management follow up and provided patient with direct contact information for care management team Advised patient,  providing education and rationale, to monitor blood pressure daily and record, calling PCP for findings outside established parameters Reviewed scheduled/upcoming provider appointments including:  Discussed complications of poorly controlled blood pressure such as heart disease, stroke, circulatory complications, vision complications, kidney impairment, sexual dysfunction Screening for signs and symptoms of depression related to chronic disease state  Assessed social determinant of health barriers  Patient Self-Care Activities:  Attend all scheduled provider appointments Call pharmacy for medication refills 3-7 days in advance of running out of medications Call provider office for new concerns or questions  Perform all self care activities independently  Perform IADL's (shopping, preparing meals, housekeeping, managing finances) independently Take medications as prescribed    Plan:  Telephone follow up appointment with care management team member scheduled for:  11/13/23 930 am          VBCI RN Care Plan       Problems:  Chronic Disease Management support and education needs related to Atrial Fibrillation  Goal: Over the next 30 days the Patient will attend all scheduled medical appointments: with PCP and Specialist as evidenced by visits in EMR        verbalize understanding of plan for management of Atrial Fibrillation as evidenced by no admission to the hospital as seen in EMR verbalize basic understanding of HTN disease process and self health management plan as evidenced by verbal explanation lifestyle changes and consistent medication compliance   Interventions:   AFIB Interventions:   Counseled on increased risk of stroke due to Afib and benefits of  anticoagulation for stroke prevention Reviewed importance of adherence to anticoagulant exactly as prescribed Counseled on avoidance of NSAIDs due to increased bleeding risk with anticoagulants Counseled on importance of regular  laboratory monitoring as prescribed Counseled on seeking medical attention after a head injury or if there is blood in the urine/stool Screening for signs and symptoms of depression related to chronic disease state  Assessed social determinant of health barriers  Patient Self-Care Activities:  Attend all scheduled provider appointments Call pharmacy for medication refills 3-7 days in advance of running out of medications Call provider office for new concerns or questions  Perform all self care activities independently  Perform IADL's (shopping, preparing meals, housekeeping, managing finances) independently Take medications as prescribed    Plan:  Telephone follow up appointment with care management team member scheduled for:  11/13/23  930 am             Please call 1-800-273-TALK (toll free, 24 hour hotline) if you are experiencing a Mental Health or Behavioral Health Crisis or need someone to talk to.  Patient verbalizes understanding of instructions and care plan provided today and agrees to view in MyChart. Active MyChart status and patient understanding of how to access instructions and care plan via MyChart confirmed with patient.     Augustin Leber RN, BSN, [facility] [place]  [facility], [facility]  Care Coordinator Phone: [phone]

## 2023-10-19 NOTE — Patient Outreach (Signed)
 Complex Care Management   Visit Note  10/19/2023  Name:  Rodney Friedman MRN: [id number] DOB: Mar 08, 1946  Situation: Referral received for Complex Care Management related to Atrial Fibrillation and HTN I obtained verbal consent from Patient.  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 Abel Hoe & Dr. 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 He is seeking options for repair, as he is taking Prolia and Eliquis . The dentist has indicated that no treatment will be administered until the management of these medications is addressed. The patient is reluctant to discontinue Eliquis  due to a prior stroke he experienced. We engaged in a discussion, during which  I provided him with several points to raise with his physician. The patient expressed gratitude for the guidance offered.    Patient Reported Symptoms:  Cognitive Cognitive Status: Able to follow simple commands, Alert and oriented to person, place, and time, Insightful and able to interpret abstract concepts, Normal speech and language skills      Neurological Neurological Review of Symptoms: No symptoms reported Neurological Conditions: Stroke, ischemic Neurological Management Strategies: Medication therapy, Exercise  HEENT HEENT Symptoms Reported: No symptoms reported HEENT Conditions: Vision problem(s) Vision Problems: cataract(s), glaucoma, blindness/vision loss HEENT Management Strategies: Medical device (Right eye scared macula) Vision problem(s)  Cardiovascular Does patient have uncontrolled Hypertension?: No Is patient checking Blood Pressure at home?: Yes Patient's Recent BP reading at home: 140/80 Cardiovascular Conditions: Coronary artery disease, Dysrhythmia, Hypertension, High blood cholesterol Cardiovascular Management Strategies: Medication therapy, Exercise, Routine screening  Respiratory Respiratory Symptoms Reported: No symptoms reported    Endocrine Patient reports the following symptoms related to hypoglycemia or hyperglycemia : No symptoms reported Is patient diabetic?: Yes Is patient checking blood sugars at home?: No Endocrine Conditions: Diabetes Endocrine Management Strategies: Medication therapy  Gastrointestinal Gastrointestinal Symptoms Reported: No symptoms reported      Genitourinary Genitourinary Symptoms Reported: No symptoms reported Genitourinary Conditions: Prostate cancer (he gets a little squirt) Genitourinary Management Strategies: Incontinence garment/pad  Integumentary Integumentary Symptoms Reported: Other Other Integumentary Symptoms: Petechiae Skin Conditions: Other Other Skin Conditions: from taking blood thinner  Musculoskeletal  Musculoskelatal Symptoms Reviewed: Difficulty walking Musculoskeletal Conditions: Back pain, Osteoarthritis Musculoskeletal Management Strategies: Medication therapy, Medical device, Exercise Falls in the past year?: Yes Number of falls in past year: 2 or more Was there an injury with Fall?: No Fall Risk Category Calculator: 2 Patient Fall Risk Level: Moderate Fall Risk Patient at Risk for Falls Due to: Other (Comment) (missed stepped on ladder) Fall risk Follow up: Falls evaluation completed, Education provided, Falls prevention discussed  Psychosocial       Quality of Family Relationships: supportive Do you feel physically threatened by others?: No      10/19/2023   10:42 AM  Depression screen PHQ 2/9  Decreased Interest 0  Down, Depressed, Hopeless 1  PHQ - 2 Score 1    There were no vitals filed for this visit.  Medications Reviewed Today     Reviewed by Augustin Leber, RN (Registered Nurse) on 10/19/23 at 1027  Med List Status: <None>   Medication Order Taking? Sig Documenting Provider Last Dose Status Informant  acetaminophen  (TYLENOL ) 325 MG tablet [id number] Yes Take 1-2 tablets (325-650 mg total) by mouth every 4 (four) hours as needed for mild pain. Setzer, Hamp Levine, PA-C Taking Active   amoxicillin  (AMOXIL ) 500 MG capsule [id number] Yes Take 1 capsule (500 mg total) by mouth 2 (two) times daily. Jerrlyn Morel, NP Taking Active   apixaban  (ELIQUIS ) 5 MG TABS tablet [id number] Yes Take 1 tablet (5 mg total) by mouth 2 (two) times daily. Jerrlyn Morel, NP Taking Active   buPROPion  (WELLBUTRIN  SR) 200 MG 12 hr tablet [id number] Yes Take 1 tablet (200 mg total) by mouth every morning. Alba Ally, MD Taking Active   buPROPion  ER (WELLBUTRIN  SR) 100 MG 12 hr tablet [id number] Yes 1 qhs Plovsky, Gerald, MD Taking Active   calcium  carbonate (TUMS - DOSED IN MG ELEMENTAL CALCIUM ) 500 MG chewable tablet [id number] Yes Chew 1 tablet by mouth 2 (two) times daily. [provider] Taking Active            Med Note Vallarie Gauze, [facility] R   Thu Aug 09, 2023 12:36 PM) Taking prn   cyanocobalamin  (VITAMIN B12) 500 MCG tablet [id number] Yes Take 1 tablet (500 mcg total) by mouth every other day. Jerrlyn Morel, NP Taking Active   Denosumab [facility]) [id number] Yes  [provider] Taking Active            Med Note Arleta Lade, Laymon Priest   Fri Oct 19, 2023 10:25 AM) On hold until tooth is taken care of   docusate sodium  (COLACE) 100 MG capsule [id number] Yes Take 1 capsule (100 mg total) by mouth 2 (two) times daily. Jerrlyn Morel, NP Taking Active   ezetimibe  (ZETIA ) 10 MG tablet [id number] Yes Take 1 tablet (10 mg total) by mouth daily. Jerrlyn Morel, NP Taking Active   gabapentin  (NEURONTIN ) 300 MG capsule [id number] Yes TAKE 1 CAPSULE BY MOUTH EVERYDAY AT BEDTIME Nichols, Tonya S, NP Taking Active   latanoprost  (XALATAN ) 0.005 % ophthalmic solution [id number] Yes Place 1 drop into the left eye every other day. [provider] Taking Active Self           Med Note Mliss Anderson Haskel Link A   Wed Jan 03, 2023  2:36 PM) Patient states that he needs a refill.  magnesium  oxide (MAG-OX) 400 (240 Mg)  MG tablet [id number] Yes Take 400 mg by mouth daily. [provider] Taking Active   metFORMIN  (GLUCOPHAGE ) 500 MG tablet [id number] Yes Take 1 tablet (500 mg total) by mouth 2 (two) times daily with a meal. Jerrlyn Morel, NP Taking Active   metoprolol  succinate (TOPROL -XL) 50 MG 24 hr tablet [id number] Yes Take 1 tablet (50 mg total) by mouth 2 (two) times daily. Take with or immediately following a meal. Jerrlyn Morel, NP Taking Active   rosuvastatin  (CRESTOR ) 20 MG tablet [id number] Yes Take 1 tablet (20 mg total) by mouth daily. Jerrlyn Morel, NP Taking Active             Recommendation:   PCP Follow-up Acute PCP follow-up with Oral surgeon on 10/22/23 and Cardiology 10/26/23 .  Follow Up Plan:   Telephone follow up appointment with  care management team member scheduled for:  11/13/23  930 am    Augustin Leber RN, BSN, [facility] [place]  [facility], [facility]  Care Coordinator Phone: [phone]

## 2023-10-22 ENCOUNTER — Encounter: Payer: Self-pay | Admitting: Cardiovascular Disease

## 2023-10-23 ENCOUNTER — Other Ambulatory Visit: Payer: Self-pay

## 2023-10-23 ENCOUNTER — Other Ambulatory Visit (INDEPENDENT_AMBULATORY_CARE_PROVIDER_SITE_OTHER): Payer: Self-pay

## 2023-10-23 DIAGNOSIS — E669 Obesity, unspecified: Secondary | ICD-10-CM

## 2023-10-23 DIAGNOSIS — E1169 Type 2 diabetes mellitus with other specified complication: Secondary | ICD-10-CM

## 2023-10-23 NOTE — Progress Notes (Unsigned)
 10/23/2023 Name: Rodney Friedman MRN: [id number] DOB: 1946-03-15  Chief Complaint  Patient presents with   Diabetes   Hypertension   Hyperlipidemia   Medication Access    Rodney Friedman is a 78 y.o. year old male who presented for a telephone visit.   They were referred to the pharmacist by their PCP for assistance in managing hypertension and medication access. 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 He was evaluated by oral surgery yesterday. They are planning to do a procedure in ~2 mo once his Prolia has worn off a bit more (last dose 06/29/23). His subsequent Prolia injections will be coordinated by his oncologist and oral surgeon. Otherwise he reports he is doing well. He has a cardiology appt on Friday.   Care Team: Primary Care Provider: Jerrlyn Morel, Friedman ; Next Scheduled Visit: 11/05/2023 Cardiologist: Rodney Rumple, MD ; Next Scheduled Visit: 10/26/2023  Medication Access/Adherence  Current Pharmacy:  [id number] [address] [place] [address] Phone: [phone] Fax: [phone]  [facility] [address] [place] [address] Phone: [phone] Fax: [phone]   Patient reports affordability concerns with their medications: Yes  - patient reports that his next copay for a three month supply of Eliquis  will be ~$300, and he will be able to pay for this.  Patient reports access/transportation concerns to their pharmacy: No  Patient reports adherence concerns with their medications:  No  - denies missed doses of his medications   Diabetes:  Current medications: metformin  IR 500 mg twice daily   Current glucose readings: N/A  - He does report that he has a glucometer and has not been checking his blood glucose. He has been feeling  overwhelmed with trying to manage his health concerns such as seeing PT, prostate cancer management, behavioral health for depression and anxiety, etc.   Patient denies hypoglycemic s/sx including dizziness, shakiness, sweating. Patient reports hyperglycemic symptoms including nocturia (but has baseline prostate issues).  Current meal patterns:  - no changes in diet from last encounter, "lots of macaroni", happily reports that he has cut out ice cream - Drinks coffee, unsweetened tea, bottled water, Premier protein, diet coke/Pepsi 2-3x per week, OJ (on occasion)  - He avoids sweetened beverages   Current physical activity: Physical Therapy once weekly, doing more physical activity in the yard as well,   Hypertension:  Current medications: Metoprolol  XL 50 mg twice daily Previously tried: amlodipine , valsartan  - were both d/c'd in 2022 after pt had issues with hypotension following TAVR in 2022  Patient has a validated, automated, upper arm home BP cuff - reports SBP in the 140s and DBP in the 80s.Does not report specific readings  Patient denies hypotensive s/sx including dizziness, lightheadedness.  Patient denies hypertensive symptoms including CP, ShOB, blurry vision, HA.  Patient reports hypertensive symptoms including shortness of breath (h/o COPD)  Hyperlipidemia/ASCVD Risk Reduction  Current lipid lowering medications: ezetimibe  10 mg daily, rosuvastatin  20 mg daily   Objective:  BP Readings from Last 3 Encounters:  09/04/23 (!) 157/93  07/31/23 (!) 155/99  07/27/23 (!) 114/90    Lab Results  Component Value Date   HGBA1C 6.6 (A) 07/27/2023    Lab Results  Component Value Date   CREATININE 1.20 04/26/2023   BUN 10 04/26/2023  NA 146 (H) 04/26/2023   K 4.6 04/26/2023   CL 108 (H) 04/26/2023   CO2 22 04/26/2023    Lab Results  Component Value Date   CHOL 88 01/03/2023   HDL 32 (L) 01/03/2023   LDLCALC 28 01/03/2023   TRIG 140 01/03/2023   CHOLHDL 2.8  01/03/2023    Medications Reviewed Today     Reviewed by Rodney Friedman, RPH (Pharmacist) on 10/23/23 at 1408  Med List Status: <None>   Medication Order Taking? Sig Documenting Provider Last Dose Status Informant  acetaminophen  (TYLENOL ) 325 MG tablet [id number]  Take 1-2 tablets (325-650 mg total) by mouth every 4 (four) hours as needed for mild pain. Friedman, Rodney Levine, PA-C  Active   amoxicillin  (AMOXIL ) 500 MG capsule [id number]  Take 1 capsule (500 mg total) by mouth 2 (two) times daily. Rodney Morel, Friedman  Active   apixaban  (ELIQUIS ) 5 MG TABS tablet [id number] Yes Take 1 tablet (5 mg total) by mouth 2 (two) times daily. Rodney Morel, Friedman Taking Active   buPROPion  (WELLBUTRIN  SR) 200 MG 12 hr tablet [id number] Yes Take 1 tablet (200 mg total) by mouth every morning. Rodney Ally, MD Taking Active   buPROPion  ER (WELLBUTRIN  SR) 100 MG 12 hr tablet [id number] Yes 1 qhs Friedman, Gerald, MD Taking Active   calcium  carbonate (TUMS - DOSED IN MG ELEMENTAL CALCIUM ) 500 MG chewable tablet [id number] Yes Chew 1 tablet by mouth 2 (two) times daily. [provider] Taking Active            Med Note Rodney Friedman, [facility] R   Thu Aug 09, 2023 12:36 PM) Taking prn   cyanocobalamin  (VITAMIN B12) 500 MCG tablet [id number]  Take 1 tablet (500 mcg total) by mouth every other day. Rodney Morel, Friedman  Active   Denosumab [facility]) [id number] Yes  [provider] Taking Active            Med Note Rodney Friedman   Tue Oct 23, 2023 10:47 AM) Last dose Jun 29 2023  docusate sodium  (COLACE) 100 MG capsule [id number] Yes Take 1 capsule (100 mg total) by mouth 2 (two) times daily. Rodney Morel, Friedman Taking Active   ezetimibe  (ZETIA ) 10 MG tablet [id number] Yes Take 1 tablet (10 mg total) by mouth daily. Rodney Morel, Friedman Taking Active   gabapentin  (NEURONTIN ) 300 MG capsule [id number] Yes TAKE 1 CAPSULE BY MOUTH EVERYDAY AT BEDTIME Rodney Friedman Taking Active   latanoprost  (XALATAN ) 0.005 %  ophthalmic solution [id number]  Place 1 drop into the left eye every other day. [provider]  Active Self           Med Note Rodney Friedman Haskel Link A   Wed Jan 03, 2023  2:36 PM) Patient states that he needs a refill.  magnesium  oxide (MAG-OX) 400 (240 Mg) MG tablet [id number] Yes Take 400 mg by mouth daily. [provider] Taking Active   metFORMIN  (GLUCOPHAGE ) 500 MG tablet [id number] Yes Take 1 tablet (500 mg total) by mouth 2 (two) times daily with a meal. Rodney Morel, Friedman Taking Active   metoprolol  succinate (TOPROL -XL) 50 MG 24 hr tablet [id number] Yes Take 1 tablet (50 mg total) by mouth 2 (two) times daily. Take with or immediately following a meal. Rodney Morel, Friedman Taking Active   rosuvastatin  (CRESTOR ) 20 MG tablet [id number] Yes Take 1 tablet (20 mg total) by mouth daily. Rodney Morel, Friedman Taking Active  Assessment/Plan:   Diabetes: - Currently controlled - Reviewed long term cardiovascular and renal outcomes of uncontrolled blood sugar - Reviewed goal A1c, goal fasting, and goal 2 hour post prandial glucose - Reviewed dietary modifications such as monitoring carbohydrate intake like pastas  - Recommend to continue current regimen.    - Future considerations: with CAD history, patient is a good candidate for GLP-1RA or SGTLi, though anticipate that cost will be an issue considering barriers to getting Eliquis . Will discuss with patient at follow-up.  - Due for yearly UACR and repeat A1C at PCP appt on 11/05/23   Hypertension: - Currently uncontrolled above goal < 130/80 mmHg given home readings ~140s/80s. Patient was previously treated with amlodipine  10 mg daily and valsartan  320 mg daily back in 2022, but both medications were discontinued due to recurrent hypotension s/p TAVR. Patient has had consistently elevated blood pressures without s/sx of hypotension, therefore would favor restarting one of these BP medications at a low dose.  Patient has secondary indication for ARB for renal protection. Will collaborate with cardiologist to facilitate restarting ARB or CCB.  - Reviewed long term cardiovascular and renal outcomes of uncontrolled blood pressure - Reviewed appropriate blood pressure monitoring technique and reviewed goal blood pressure. Recommended to check home blood pressure and heart rate once weekly on Thursday with the goal to do three times week as he is more compliant. Record on paper as patient does not have smart phone.  - Recommend to continue metoprolol  succinate 50 mg BID - Will send message to cardiology (Dr. Alvis Ba) to facilitate restarting ARB or CCB at upcoming appointment on 10/26/23. If we restart valsartan  80 mg daily, can recheck BMP to monitor Scr and K at PCP appt on 11/05/23.  Hyperlipidemia/ASCVD Risk Reduction: - Currently controlled with LDL-C below goal < 55 mg/dL given extensive ASCVD history. - Reviewed long term complications of uncontrolled cholesterol - Recommend to continue rosuvastatin  20 mg daily, ezetimibe  10 mg daily  Follow Up Plan: PCP 11/05/23, Pharmacist telephone 12/24/23  Arthea Larsson, PharmD PGY1 Pharmacy Resident

## 2023-10-25 ENCOUNTER — Telehealth: Payer: Self-pay

## 2023-10-25 NOTE — Telephone Encounter (Signed)
   Pre-operative Risk Assessment    Patient Name: Rodney Friedman  DOB: 04/16/1946 MRN: [id number]   Date of last office visit: 05/07/23 Luana Rumple, MD Date of next office visit: 10/26/23 Luana Rumple, MD  Request for Surgical Clearance    Procedure:  Dental Extraction - Amount of Teeth to be Pulled:  1  Date of Surgery:  Clearance TBD                                Surgeon:  NOT INDICATED Surgeon's Group or Practice Name:  [facility] Phone number:  [phone] Fax number:  [phone]   Type of Clearance Requested:   - Medical  - Pharmacy:  Hold Apixaban  (Eliquis ) 48 HOURS PERI-OPERATIVELY   Type of Anesthesia:  Local    Additional requests/questions:    Signed, Collin Deal   10/25/2023, 2:27 PM

## 2023-10-25 NOTE — Telephone Encounter (Signed)
    Primary Cardiologist: Luana Rumple, MD  Chart reviewed as part of pre-operative protocol coverage. Simple dental extractions are considered low risk procedures per guidelines and generally do not require any specific cardiac clearance. It is also generally accepted that for simple extractions and dental cleanings, there is no need to interrupt blood thinner therapy.   SBE prophylaxis is required for the patient.  I will route this recommendation to the requesting party via Epic fax function and remove from pre-op pool.  Please call with questions.  Ava Boatman, NP 10/25/2023, 5:12 PM

## 2023-10-26 ENCOUNTER — Encounter: Payer: Self-pay | Admitting: Cardiovascular Disease

## 2023-10-26 ENCOUNTER — Ambulatory Visit: Payer: Medicare Other | Attending: Cardiovascular Disease | Admitting: Cardiovascular Disease

## 2023-10-26 VITALS — BP 142/84 | HR 73 | Ht 68.0 in | Wt 197.0 lb

## 2023-10-26 DIAGNOSIS — I340 Nonrheumatic mitral (valve) insufficiency: Secondary | ICD-10-CM | POA: Diagnosis present

## 2023-10-26 DIAGNOSIS — Z79899 Other long term (current) drug therapy: Secondary | ICD-10-CM | POA: Diagnosis present

## 2023-10-26 DIAGNOSIS — Z952 Presence of prosthetic heart valve: Secondary | ICD-10-CM | POA: Insufficient documentation

## 2023-10-26 DIAGNOSIS — I25708 Atherosclerosis of coronary artery bypass graft(s), unspecified, with other forms of angina pectoris: Secondary | ICD-10-CM | POA: Insufficient documentation

## 2023-10-26 DIAGNOSIS — J432 Centrilobular emphysema: Secondary | ICD-10-CM | POA: Diagnosis present

## 2023-10-26 DIAGNOSIS — I4821 Permanent atrial fibrillation: Secondary | ICD-10-CM | POA: Diagnosis present

## 2023-10-26 DIAGNOSIS — E119 Type 2 diabetes mellitus without complications: Secondary | ICD-10-CM | POA: Insufficient documentation

## 2023-10-26 DIAGNOSIS — I5032 Chronic diastolic (congestive) heart failure: Secondary | ICD-10-CM | POA: Diagnosis present

## 2023-10-26 DIAGNOSIS — I10 Essential (primary) hypertension: Secondary | ICD-10-CM | POA: Insufficient documentation

## 2023-10-26 DIAGNOSIS — D6869 Other thrombophilia: Secondary | ICD-10-CM | POA: Diagnosis present

## 2023-10-26 DIAGNOSIS — C9110 Chronic lymphocytic leukemia of B-cell type not having achieved remission: Secondary | ICD-10-CM | POA: Insufficient documentation

## 2023-10-26 DIAGNOSIS — E785 Hyperlipidemia, unspecified: Secondary | ICD-10-CM | POA: Diagnosis present

## 2023-10-26 DIAGNOSIS — I6523 Occlusion and stenosis of bilateral carotid arteries: Secondary | ICD-10-CM | POA: Insufficient documentation

## 2023-10-26 LAB — BASIC METABOLIC PANEL WITH GFR
BUN/Creatinine Ratio: 13 (ref 10–24)
BUN: 16 mg/dL (ref 8–27)
CO2: 21 mmol/L (ref 20–29)
Calcium: 9.9 mg/dL (ref 8.6–10.2)
Chloride: 107 mmol/L — ABNORMAL HIGH (ref 96–106)
Creatinine, Ser: 1.24 mg/dL (ref 0.76–1.27)
Glucose: 123 mg/dL — ABNORMAL HIGH (ref 70–99)
Potassium: 4.8 mmol/L (ref 3.5–5.2)
Sodium: 143 mmol/L (ref 134–144)
eGFR: 61 mL/min/{1.73_m2} (ref >59)

## 2023-10-26 LAB — LIPID PANEL
Chol/HDL Ratio: 2.6 ratio (ref 0.0–5.0)
Cholesterol, Total: 95 mg/dL — ABNORMAL LOW (ref 100–199)
HDL: 36 mg/dL — ABNORMAL LOW (ref >39)
LDL Chol Calc (NIH): 38 mg/dL (ref 0–99)
Triglycerides: 118 mg/dL (ref 0–149)
VLDL Cholesterol Cal: 21 mg/dL (ref 5–40)

## 2023-10-26 MED ORDER — VALSARTAN 80 MG PO TABS: 80.0000 mg | ORAL_TABLET | Freq: Every day | ORAL | 3 refills | Status: DC

## 2023-10-26 NOTE — Patient Instructions (Addendum)
 Medication Instructions:  - START valsartan  (DIOVAN ) 80 MG ONCE DAILY     *If you need a refill on your cardiac medications before your next appointment, please call your pharmacy*   Lab Work: LIPID PANEL BASIC METABOLIC PANEL    If you have labs (blood work) drawn today and your tests are completely normal, you will receive your results only by: MyChart Message (if you have MyChart) OR A paper copy in the mail If you have any lab test that is abnormal or we need to change your treatment, we will call you to review the results.   Testing/Procedures: NONE    Follow-Up: At [facility], you and your health needs are our priority.  As part of our continuing mission to provide you with exceptional heart care, we have created designated Provider Care Teams.  These Care Teams include your primary Cardiologist (physician) and Advanced Practice Providers (APPs -  Physician Assistants and Nurse Practitioners) who all work together to provide you with the care you need, when you need it.  We recommend signing up for the patient portal called "MyChart".  Sign up information is provided on this After Visit Summary.  MyChart is used to connect with patients for Virtual Visits (Telemedicine).  Patients are able to view lab/test results, encounter notes, upcoming appointments, etc.  Non-urgent messages can be sent to your provider as well.   To learn more about what you can do with MyChart, go to [email or web address].    Your next appointment:   6 month(s)  The format for your next appointment:   In Person  Provider:   Luana Rumple, MD   Other Instructions

## 2023-10-26 NOTE — Progress Notes (Signed)
 Cardiology Office Note    Date:  10/26/2023   ID:  Avelardo, Reesman 1946-03-06, MRN [id number]  PCP:  Jerrlyn Morel, NP  Cardiologist:   Luana Rumple, MD   chief complaint: CAD, AFib, AS, HTN, HLP   History of Present Illness:  Rodney Friedman is a 78 y.o. male with CAD and remote CABG (three-vessel, March 2006; LIMA to LAD , SVG to diagonal and SVG to distal OM, all patent by coronary angiography 01/20/2021), longstanding persistent atrial fibrillation, aortic stenosis s/p TAVR (26 mm Edwards SAPIEN 3 ultra THV September 2022), severe mitral annular calcification with mitral regurgitation (presumably ischemic mechanism), persistent atrial fibrillation, carotid artery stenosis, hypertension, hyperlipidemia and type 2 diabetes mellitus, mild centrilobular emphysema.  He presented with an acute lacunar left thalamic infarct and a left sublingual cortical infarct, presumably embolic in August 2024.  He was switched from warfarin to Eliquis  at that time.  He is doing quite well.  He has not had any new neurological events.  He denies shortness of breath or angina at rest or with activity.  He likes working in the yard and occasionally trips over his stools or a lawnmower, but has not had any serious falls injuries or bleeding problems.  She does not think that his falls are due to dizziness or any neurological deficits.  He does not have claudication or lower extremity edema.  He feels that his stamina is dramatically improved after undergoing the TAVR procedure.  He has not had problems with wheezing or cough.  It has been about 3 years since he stopped smoking.  He is having some dental procedures done on a broken tooth and takes his amoxicillin  conscientiously 1 hour before each procedure.  He is in atrial fibrillation today and we have not seen normal rhythm since April 2021.  Rate control is good.  His blood pressure has been running on the high side in the 140s/80s  consistently.  Many of his medications were curtailed after his stroke.  He has lost about 10 or 12 pounds.  Metabolic control is good with a hemoglobin A1c of 6.6% a few months ago.  He has not had his lipid profile checked since last year, but at that time his LDL was 28.  He has a chronically low HDL at 32.  Most recent echo at the time of his stroke in August 2024 showed EF 55 to 60%, indeterminate diastolic function, severe biatrial dilation, mild mitral stenosis and mild-moderate mitral insufficiency due to severe mitral and calcification (mean gradient 6 mmHg) and normal function of the TAVR prosthesis with a mean gradient of 13 mmHg.  His prostate cancer is currently considered to be in remission.  He sees Dr. Marton Sleeper for chronic lymphocytic leukemia which is so far does not require treatment.  His most recent WBC was only 14.9 K with normal platelets and hemoglobin of 15.9.   Rodney Friedman's father passed away in 31-Oct-2019 at age 32.  He was very dedicated to his father's care and misses him.  He  is well versed  in the theoretical aspects of addiction since he has a Masters degree on the topic.  He has a history of coronary disease and underwent 3 vessel bypass surgery in [place]  in 10-30-04 ("heartburn" and left antecubital pain ).  All his bypass grafts were open by angiography in September 2022.  He underwent TAVR for severe aortic stenosis shortly thereafter (Edwards S3 U 26 mm).  He has had paroxysmal  atrial fibrillation with RVR off and on, generally asymptomatic.Aaron Aas His nuclear stress test in June 2013 showed normal pattern of perfusion and an ejection fraction of 52%. He has bilateral carotid bruits that are reportedly due to external carotid artery stenoses. His last carotid ultrasound was performed in September 2022 and showed no significant internal carotid stenosis.   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 He has treated prostate cancer and  is followed by Dr. Marton Sleeper.  He has a long history of alcohol  and drug use but has been sober for over 15 years and still goes to [employer]  He finally successfully quit smoking permanently in 2022, around the time of his TAVR.  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 Abel Hoe & Dr. Sherene Dilling   Sleep apnea    Stroke [facility])    Substance abuse (HCC)    Tremor, essential 09/22/2015    Past Surgical History:  Procedure Laterality Date   Arm Surgery Right    from door accident with glass   CARDIAC CATHETERIZATION     CATARACT EXTRACTION W/ INTRAOCULAR LENS IMPLANT Bilateral    COLONOSCOPY     CORONARY ARTERY BYPASS GRAFT  08/04/2004   EYE SURGERY     POLYPECTOMY     RIGHT/LEFT HEART CATH AND CORONARY/GRAFT ANGIOGRAPHY N/A 01/20/2021   Procedure: RIGHT/LEFT HEART CATH AND CORONARY/GRAFT ANGIOGRAPHY;  Surgeon: Odie Benne, MD;  Location: [facility] INVASIVE CV LAB;  Service:  Cardiovascular;  Laterality: N/A;   TEE WITHOUT CARDIOVERSION N/A 02/08/2021   Procedure: TRANSESOPHAGEAL ECHOCARDIOGRAM (TEE);  Surgeon: Odie Benne, MD;  Location: [facility] INVASIVE CV LAB;  Service: Open Heart Surgery;  Laterality: N/A;   TRANSCATHETER AORTIC VALVE REPLACEMENT, CAROTID Left 02/08/2021   Procedure: TRANSCATHETER AORTIC VALVE REPLACEMENT, LEFT CAROTID;  Surgeon: Odie Benne, MD;  Location: [facility] INVASIVE CV LAB;  Service: Open Heart Surgery;  Laterality: Left;    Current Medications: Outpatient Medications Prior to Visit  Medication Sig Dispense Refill   Accu-Chek Softclix Lancets lancets 2 (two) times a week.     apixaban  (ELIQUIS ) 5 MG TABS tablet Take 1 tablet (5 mg total) by mouth 2 (two) times daily. 180 tablet 3   Blood  Glucose Monitoring Suppl (ACCU-CHEK GUIDE) w/Device KIT as directed.     buPROPion  (WELLBUTRIN  SR) 200 MG 12 hr tablet Take 1 tablet (200 mg total) by mouth every morning. 90 tablet 2   buPROPion  ER (WELLBUTRIN  SR) 100 MG 12 hr tablet 1 qhs 90 tablet 4   cyanocobalamin  (VITAMIN B12) 500 MCG tablet Take 1 tablet (500 mcg total) by mouth every other day. 60 tablet 2   Denosumab (PROLIA [place]) every 6 (six) months.     ezetimibe  (ZETIA ) 10 MG tablet Take 1 tablet (10 mg total) by mouth daily. 90 tablet 3   gabapentin  (NEURONTIN ) 300 MG capsule TAKE 1 CAPSULE BY MOUTH EVERYDAY AT BEDTIME 90 capsule 1   ketoconazole  (NIZORAL ) 2 % shampoo Apply 1 Application topically 2 (two) times a week.     latanoprost  (XALATAN ) 0.005 % ophthalmic solution Place 1 drop into the left eye every other day.     magnesium  oxide (MAG-OX) 400 (240 Mg) MG tablet Take 400 mg by mouth daily.     metFORMIN  (GLUCOPHAGE ) 500 MG tablet Take 1 tablet (500 mg total) by mouth 2 (two) times daily with a meal. 180 tablet 1   metoprolol  succinate (TOPROL -XL) 50 MG 24 hr tablet Take 1 tablet (50 mg total) by mouth 2 (two) times daily. Take with or immediately following a meal. 180  tablet 0   rosuvastatin  (CRESTOR ) 20 MG tablet Take 1 tablet (20 mg total) by mouth daily. 90 tablet 3   acetaminophen  (TYLENOL ) 325 MG tablet Take 1-2 tablets (325-650 mg total) by mouth every 4 (four) hours as needed for mild pain. (Patient not taking: Reported on 10/26/2023)     amoxicillin  (AMOXIL ) 500 MG capsule Take 1 capsule (500 mg total) by mouth 2 (two) times daily. (Patient not taking: Reported on 10/26/2023) 24 capsule 0   calcium  carbonate (TUMS - DOSED IN MG ELEMENTAL CALCIUM ) 500 MG chewable tablet Chew 1 tablet by mouth 2 (two) times daily. (Patient not taking: Reported on 10/26/2023)     docusate sodium  (COLACE) 100 MG capsule Take 1 capsule (100 mg total) by mouth 2 (two) times daily. (Patient not taking: Reported on 10/26/2023)     No facility-administered medications prior to visit.     Allergies:   Other, Cayenne, and Poison ivy extract   Social History   Socioeconomic History   Marital status: Divorced    Spouse name: Not on file   Number of children: 2   Years of education: 16   Highest education level: Master's degree (e.g., MA, MS, MEng, MEd, MSW, MBA)  Occupational History   Occupation: Retired Runner, broadcasting/film/video  Tobacco Use   Smoking status: Former    Current packs/day: 0.00    Average packs/day: 0.8 packs/day for 59.0 years (44.3 ttl pk-yrs)    Types: Cigarettes    Start date: 12/16/1961    Quit date: 12/16/2020    Years since quitting: 2.8    Passive exposure: Past   Smokeless tobacco: Never  Vaping Use   Vaping status: Never Used  Substance and Sexual Activity   Alcohol  use: No    Comment: none since march 3,2002   Drug use: No   Sexual activity: Not Currently  Other Topics Concern   Not on file  Social History Narrative   Lives alone   Divorced   Right-handed   Education: College   No caffeine   Social Drivers of Health   Financial Resource Strain: Medium Risk (06/07/2023)   Overall Financial Resource Strain (  CARDIA)    Difficulty of Paying Living  Expenses: Somewhat hard  Food Insecurity: No Food Insecurity (09/19/2023)   Hunger Vital Sign    Worried About Running Out of Food in the Last Year: Never true    Ran Out of Food in the Last Year: Never true  Transportation Needs: No Transportation Needs (09/19/2023)   PRAPARE - Administrator, Civil Service (Medical): No    Lack of Transportation (Non-Medical): No  Physical Activity: Insufficiently Active (06/07/2023)   Exercise Vital Sign    Days of Exercise per Week: 4 days    Minutes of Exercise per Session: 30 min  Stress: No Stress Concern Present (06/07/2023)   [employer] of Occupational Health - Occupational Stress Questionnaire    Feeling of Stress : Only a little  Recent Concern: Stress - Stress Concern Present (04/22/2023)   [employer] of Occupational Health - Occupational Stress Questionnaire    Feeling of Stress : Very much  Social Connections: Moderately Integrated (06/07/2023)   Social Connection and Isolation Panel [NHANES]    Frequency of Communication with Friends and Family: Three times a week    Frequency of Social Gatherings with Friends and Family: More than three times a week    Attends Religious Services: More than 4 times per year    Active Member of [employer] or Organizations: Yes    Attends Engineer, structural: More than 4 times per year    Marital Status: Divorced     Family History:  The patient's family history includes Colon cancer in his mother; Drug abuse in his daughter; Hypertension in his father; Ovarian cancer in his mother; Prostate cancer in his father; Uterine cancer in his mother.   ROS:   Please see the history of present illness.   All other systems are reviewed and are negative.   PHYSICAL EXAM:   VS:  BP (!) 142/84 (BP Location: Left Arm, Patient Position: Sitting, Cuff Size: Normal)   Pulse 73   Ht 5\' 8"  (1.727 m)   Wt 89.4 kg   SpO2 99%   BMI 29.95 kg/m      General: Alert, oriented x3, no distress,  overweight/borderline obese Head: no evidence of trauma, PERRL, EOMI, no exophtalmos or lid lag, no myxedema, no xanthelasma; normal ears, nose and oropharynx Neck: normal jugular venous pulsations and no hepatojugular reflux; brisk carotid pulses without delay with faint bilateral carotid bruits Chest: clear to auscultation, no signs of consolidation by percussion or palpation, normal fremitus, symmetrical and full respiratory excursions Cardiovascular: normal position and quality of the apical impulse, irregular rhythm, normal first and second heart sounds, 1/6 short systolic ejection murmur at the aortic focus, no diastolic murmurs, rubs or gallops Abdomen: no tenderness or distention, no masses by palpation, no abnormal pulsatility or arterial bruits, normal bowel sounds, no hepatosplenomegaly Extremities: no clubbing, cyanosis or edema; 2+ radial, ulnar and brachial pulses bilaterally; 2+ right femoral, posterior tibial and dorsalis pedis pulses; 2+ left femoral, posterior tibial and dorsalis pedis pulses; no subclavian or femoral bruits Neurological: grossly nonfocal Psych: Normal mood and affect    HYPERTENSION CONTROL Vitals:   10/26/23 0753 10/26/23 0832  BP: (!) 142/82 (!) 142/84    The patient's blood pressure is elevated above target today.  In order to address the patient's elevated BP:        Wt Readings from Last 3 Encounters:  10/26/23 89.4 kg  07/31/23 91.3 kg  07/27/23 91.4 kg  Studies/Labs Reviewed:   ECHO 08 13 2024   1. Left ventricular ejection fraction, by estimation, is 55 to 60%. The  left ventricle has normal function. The left ventricle demonstrates  regional wall motion abnormalities (see scoring diagram/findings for  description). There is severe asymmetric  left ventricular hypertrophy of the basal-septal segment. Left ventricular  diastolic parameters are indeterminate.   2. Right ventricular systolic function is normal. The right ventricular   size is normal.   3. Left atrial size was severely dilated.   4. Right atrial size was severely dilated.   5. The mitral valve is degenerative. Mild to moderate mitral valve  regurgitation. Mild mitral stenosis. The mean mitral valve gradient is 6.0  mmHg with average heart rate of 70 bpm. Severe mitral annular  calcification.   6. The aortic valve has been repaired/replaced. Aortic valve  regurgitation is not visualized. Echo findings are consistent with normal  structure and function of the aortic valve prosthesis. Aortic valve mean  gradient measures 13.0 mmHg.   7. The inferior vena cava is normal in size with greater than 50%  respiratory variability, suggesting right atrial pressure of 3 mmHg.   8. Evidence of atrial level shunting detected by color flow Doppler.  Agitated saline contrast bubble study was negative, with no evidence of  any interatrial shunt.  EKG:    EKG Interpretation Date/Time:  Friday October 26 2023 07:58:58 EDT Ventricular Rate:  73 PR Interval:    QRS Duration:  118 QT Interval:  414 QTC Calculation: 456 R Axis:   -36  Text Interpretation: Atrial fibrillation Left axis deviation Left ventricular hypertrophy with QRS widening and repolarization abnormality ( R in aVL , Cornell product ) When compared with ECG of 07-May-2023 08:22, No significant change was found Confirmed by Othello Sgroi ([id number]) on 10/26/2023 8:30:32 AM         Recent Labs: 01/08/2023: Magnesium  1.8 04/26/2023: ALT 24; BUN 10; Creatinine, Ser 1.20; Potassium 4.6; Sodium 146 07/31/2023: Hemoglobin 15.9; Platelets 206 K; WBC 12K Hemoglobin A1c 6.5%.  Lipid Panel    Component Value Date/Time   CHOL 88 01/03/2023 0321   CHOL 99 (L) 05/26/2022 0908   TRIG 140 01/03/2023 0321   HDL 32 (L) 01/03/2023 0321   HDL 34 (L) 05/26/2022 0908   CHOLHDL 2.8 01/03/2023 0321   VLDL 28 01/03/2023 0321   LDLCALC 28 01/03/2023 0321   LDLCALC 42 05/26/2022 0908    ASSESSMENT:    1. Permanent  atrial fibrillation (HCC)   2. S/P TAVR (transcatheter aortic valve replacement)   3. Nonrheumatic mitral valve regurgitation   4. Acquired thrombophilia (HCC)   5. Chronic diastolic CHF (congestive heart failure) (HCC)   6. Coronary artery disease of bypass graft of native heart with stable angina pectoris (HCC)   7. Dyslipidemia (high LDL; low HDL)   8. Medication management   9. Type 2 diabetes mellitus without complication, without long-term current use of insulin  (HCC)   10. Essential hypertension   11. Centrilobular emphysema (HCC)   12. Carotid stenosis, bilateral   13. CLL (chronic lymphocytic leukemia) (HCC)       PLAN:  In order of problems listed above:  AFib: Treated as a permanent arrhythmia.  Well rate controlled.  On chronic anticoagulation with Eliquis .  Asymptomatic.  He did have a stroke while still on warfarin August 2024.  He has a severely dilated left atrium. CHADSVasc 7 (age, HTN, CAD, DM, stroke).  AS s/p TAVR: Normal function of  the TAVR prosthesis.  Aware of the need for endocarditis prophylaxis with dental procedures. MR/MS: So far asymptomatic.  Degenerative mitral valve disease with mild mitral stenosis and mild-moderate mitral insufficiency. Anticoagulation: The cost is a problem, but he has not had any bleeding issues. CHF: Preserved ejection fraction, euvolemic without diuretics, NYHA functional class I-2.  After he had TAVR for severe arctic stenosis he has not had any episodes of overt heart failure. CAD s/p CABG: Angina free on beta-blockers.  All 3 bypass grafts remain patent by cardiac catheterization in 2022.  Not on aspirin  due to full anticoagulation for atrial fibrillation. HLP: on high-dose rosuvastatin  and ezetimibe .  Check lipid profile today. DM: Good glucose control on metformin  monotherapy. HTN: Blood pressure is consistently a little high.  Add valsartan  80 mg daily. COPD: Centrilobular emphysema on CT, but PFTs are not too bad.  He has not  smoked in over 3 years. Carotid bruits: There was no evidence of internal carotid artery stenosis on ultrasound in 2024.  He has bilateral carotid bruits due to external carotid artery stenoses. OSA: He has lost weight.  He does not have daytime hypersomnolence.  He does not tolerate CPAP. Obesity: Now in overweight range.  He is lost 10 to 12 pounds in the last year. CLL: Without evidence of anemia or thrombocytopenia, not yet requiring treatment.  Followed by Dr. Marton Sleeper. Prostate Ca: In remission.  PSA barely detectable.  Had initial treatment with radiation therapy several years ago and then last year a few sessions of radiation therapy for recurrent lymphadenopathy in the pelvis.  On Lupron  which he tolerates well.    Medication Adjustments/Labs and Tests Ordered: Current medicines are reviewed at length with the patient today.  Concerns regarding medicines are outlined above.  Medication changes, Labs and Tests ordered today are listed in the Patient Instructions below. Patient Instructions  Medication Instructions:  - START valsartan  (DIOVAN ) 80 MG ONCE DAILY     *If you need a refill on your cardiac medications before your next appointment, please call your pharmacy*   Lab Work: LIPID PANEL BASIC METABOLIC PANEL    If you have labs (blood work) drawn today and your tests are completely normal, you will receive your results only by: MyChart Message (if you have MyChart) OR A paper copy in the mail If you have any lab test that is abnormal or we need to change your treatment, we will call you to review the results.   Testing/Procedures: NONE    Follow-Up: At [facility], you and your health needs are our priority.  As part of our continuing mission to provide you with exceptional heart care, we have created designated Provider Care Teams.  These Care Teams include your primary Cardiologist (physician) and Advanced Practice Providers (APPs -  Physician Assistants and Nurse  Practitioners) who all work together to provide you with the care you need, when you need it.  We recommend signing up for the patient portal called "MyChart".  Sign up information is provided on this After Visit Summary.  MyChart is used to connect with patients for Virtual Visits (Telemedicine).  Patients are able to view lab/test results, encounter notes, upcoming appointments, etc.  Non-urgent messages can be sent to your provider as well.   To learn more about what you can do with MyChart, go to [email or web address].    Your next appointment:   6 month(s)  The format for your next appointment:   In Person  Provider:   Luana Rumple,  MD   Other Instructions     Signed, Luana Rumple, MD  10/26/2023 8:39 AM    [facility] [address] [place], [place], [place]  [address] Phone: [phone]; Fax: [phone]

## 2023-10-27 ENCOUNTER — Encounter: Payer: Self-pay | Admitting: Cardiovascular Disease

## 2023-10-27 ENCOUNTER — Ambulatory Visit: Payer: Self-pay | Admitting: Cardiovascular Disease

## 2023-10-29 MED ORDER — VALSARTAN 80 MG PO TABS: 80.0000 mg | ORAL_TABLET | Freq: Every day | ORAL | 3 refills | Status: AC

## 2023-11-05 ENCOUNTER — Ambulatory Visit (HOSPITAL_COMMUNITY)
Admission: RE | Admit: 2023-11-05 | Discharge: 2023-11-05 | Disposition: A | Discharge status: Home / Self Care | Source: Ambulatory Visit | Attending: Acute Care | Admitting: Acute Care

## 2023-11-05 ENCOUNTER — Ambulatory Visit (INDEPENDENT_AMBULATORY_CARE_PROVIDER_SITE_OTHER): Payer: Self-pay | Admitting: Nurse Practitioner

## 2023-11-05 ENCOUNTER — Encounter: Payer: Self-pay | Admitting: Nurse Practitioner

## 2023-11-05 VITALS — BP 122/78 | HR 63 | Temp 97.9°F | Wt 201.2 lb

## 2023-11-05 DIAGNOSIS — Z122 Encounter for screening for malignant neoplasm of respiratory organs: Secondary | ICD-10-CM | POA: Insufficient documentation

## 2023-11-05 DIAGNOSIS — Z87891 Personal history of nicotine dependence: Secondary | ICD-10-CM | POA: Diagnosis present

## 2023-11-05 DIAGNOSIS — E669 Obesity, unspecified: Secondary | ICD-10-CM | POA: Diagnosis not present

## 2023-11-05 DIAGNOSIS — E1169 Type 2 diabetes mellitus with other specified complication: Secondary | ICD-10-CM

## 2023-11-05 LAB — POCT GLYCOSYLATED HEMOGLOBIN (HGB A1C): Hemoglobin A1C: 6.2 % — AB (ref 4.0–5.6)

## 2023-11-05 NOTE — Progress Notes (Addendum)
 Subjective   Patient ID: Rodney Friedman, male    DOB: November 23, 1945, 78 y.o.   MRN: [id number]  Chief Complaint  Patient presents with   Medical Management of Chronic Issues    Follow up on Diabetes    Follow-up    Referring provider: Jerrlyn Morel, NP  Rodney Friedman is a 78 y.o. male with Past Medical History: No date: Adenomatous colon polyp No date: Alcoholism (HCC) No date: Allergy No date: Anxiety No date: Arthritis No date: Atrial fibrillation (HCC) No date: CAD (coronary artery disease) No date: Cataract     Comment:  removed left eye  12/16/2020: CHF (congestive heart failure) (HCC) 07/08/2013: CLL (chronic lymphocytic leukemia) (HCC) No date: Clotting disorder (HCC) No date: Degenerative arthritis of lumbar spine No date: Degenerative disc disease, lumbar No date: Depression No date: Diabetes mellitus without complication (HCC) No date: Diverticulosis No date: Dyspnea No date: Dysrhythmia No date: Eye abnormality     Comment:  Macular scarring R eye No date: Glaucoma 2002: Heart murmur     Comment:  diagnosed about 20 years ago. Pt says it causes               abnormal EKGs No date: HLD (hyperlipidemia) No date: Hypertension No date: Leukemia [facility])     Comment:  CLL 2006: Myocardial infarction (HCC) No date: Osteopenia No date: Osteoporosis 07/08/2013: Prostate cancer (HCC)     Comment:  [facility]- getting Lup[facility]  shot every 6               months - traces in prostate and 2 lymphnodes left hip-               non focused traces per pt  02/08/2021: S/P TAVR (transcatheter aortic valve replacement)     Comment:  s/p TAVR with a 23mm Edwards S3U via the TF approach by               Dr. Abel Hoe & Dr. Sherene Dilling No date: Sleep apnea No date: Stroke [facility]) No date: Substance abuse (HCC) 09/22/2015: Tremor, essential  HPI  Patient presents today for routine follow-up visit. Overall he has been stable since his last visit  here. Does need oral surgery in August. Patient is following with cardiology and is taking eliquis . Has been restarted on valsartan . He is following with urology to monitor elevated PSA level. He has followed with oncology. Patient does have a history of smoking but no longer smokes. A1C in office today is 6.2. Has started prolia. Denies f/c/s, n/v/d, hemoptysis, PND, leg swelling. Denies chest pain or edema.    Allergies  Allergen Reactions   Other Rash    Allergen: Plants and bushes while doing yard work   [employer] Nausea And Vomiting   Poison Ivy Extract Rash    Immunization History  Administered Date(s) Administered   Fluad Quad(high Dose 65+) 01/22/2019, 01/25/2023   Influenza Split 02/17/2015   Influenza, High Dose Seasonal PF 02/15/2018, 01/22/2020, 01/23/2022   Influenza, Quadrivalent, Recombinant, Inj, Pf 01/22/2019   Influenza,inj,Quad PF,6+ Mos 02/11/2016, 02/08/2017   Influenza-Unspecified 02/17/2015, 01/22/2019, 01/23/2022   Moderna Sars-Covid-2 Vaccination 08/20/2019, 09/17/2019, 03/25/2020   Pneumococcal Conjugate-13 02/24/2014   Pneumococcal Polysaccharide-23 09/08/2015   Tdap 09/08/2015   Unspecified SARS-COV-2 Vaccination 08/20/2019   Zoster Recombinant(Shingrix) 02/23/2022, 04/26/2022   Zoster, Live 05/22/2013    Tobacco History: Social History   Tobacco Use  Smoking Status Former   Current packs/day: 0.00   Average packs/day: 0.8  packs/day for 59.0 years (44.3 ttl pk-yrs)   Types: Cigarettes   Start date: 12/16/1961   Quit date: 12/16/2020   Years since quitting: 2.8   Passive exposure: Past  Smokeless Tobacco Never   Counseling given: Not Answered   Outpatient Encounter Medications as of 11/05/2023  Medication Sig   apixaban  (ELIQUIS ) 5 MG TABS tablet Take 1 tablet (5 mg total) by mouth 2 (two) times daily.   buPROPion  (WELLBUTRIN  SR) 200 MG 12 hr tablet Take 1 tablet (200 mg total) by mouth every morning.   buPROPion  ER (WELLBUTRIN  SR) 100 MG 12 hr  tablet 1 qhs   cyanocobalamin  (VITAMIN B12) 500 MCG tablet Take 1 tablet (500 mcg total) by mouth every other day.   Denosumab (PROLIA Bellerose) every 6 (six) months.   ezetimibe  (ZETIA ) 10 MG tablet Take 1 tablet (10 mg total) by mouth daily.   gabapentin  (NEURONTIN ) 300 MG capsule TAKE 1 CAPSULE BY MOUTH EVERYDAY AT BEDTIME   ketoconazole  (NIZORAL ) 2 % shampoo Apply 1 Application topically 2 (two) times a week.   latanoprost  (XALATAN ) 0.005 % ophthalmic solution Place 1 drop into the left eye every other day.   magnesium  oxide (MAG-OX) 400 (240 Mg) MG tablet Take 400 mg by mouth daily.   metFORMIN  (GLUCOPHAGE ) 500 MG tablet Take 1 tablet (500 mg total) by mouth 2 (two) times daily with a meal.   metoprolol  succinate (TOPROL -XL) 50 MG 24 hr tablet Take 1 tablet (50 mg total) by mouth 2 (two) times daily. Take with or immediately following a meal.   rosuvastatin  (CRESTOR ) 20 MG tablet Take 1 tablet (20 mg total) by mouth daily.   valsartan  (DIOVAN ) 80 MG tablet Take 1 tablet (80 mg total) by mouth daily.   Accu-Chek Softclix Lancets lancets 2 (two) times a week. (Patient not taking: Reported on 11/05/2023)   acetaminophen  (TYLENOL ) 325 MG tablet Take 1-2 tablets (325-650 mg total) by mouth every 4 (four) hours as needed for mild pain. (Patient not taking: Reported on 10/26/2023)   amoxicillin  (AMOXIL ) 500 MG capsule Take 1 capsule (500 mg total) by mouth 2 (two) times daily. (Patient not taking: Reported on 10/26/2023)   Blood Glucose Monitoring Suppl (ACCU-CHEK GUIDE) w/Device KIT as directed. (Patient not taking: Reported on 11/05/2023)   calcium  carbonate (TUMS - DOSED IN MG ELEMENTAL CALCIUM ) 500 MG chewable tablet Chew 1 tablet by mouth 2 (two) times daily. (Patient not taking: Reported on 10/26/2023)   docusate sodium  (COLACE) 100 MG capsule Take 1 capsule (100 mg total) by mouth 2 (two) times daily. (Patient not taking: Reported on 10/26/2023)   No facility-administered encounter medications on file as of  11/05/2023.    Review of Systems  Review of Systems  Constitutional: Negative.   HENT: Negative.    Cardiovascular: Negative.   Gastrointestinal: Negative.   Allergic/Immunologic: Negative.   Neurological: Negative.   Psychiatric/Behavioral: Negative.       Objective:   BP 122/78   Pulse 63   Temp 97.9 F (36.6 C) (Oral)   Wt 201 lb 3.2 oz (91.3 kg)   SpO2 100%   BMI 30.59 kg/m   Wt Readings from Last 5 Encounters:  11/05/23 201 lb 3.2 oz (91.3 kg)  10/26/23 197 lb (89.4 kg)  07/31/23 201 lb 3.2 oz (91.3 kg)  07/27/23 201 lb 9.6 oz (91.4 kg)  07/06/23 206 lb (93.4 kg)     Physical Exam Vitals and nursing note reviewed.  Constitutional:      General: He  is not in acute distress.    Appearance: He is well-developed.   Cardiovascular:     Rate and Rhythm: Normal rate and regular rhythm.  Pulmonary:     Effort: Pulmonary effort is normal.     Breath sounds: Normal breath sounds.   Skin:    General: Skin is warm and dry.   Neurological:     Mental Status: He is alert and oriented to person, place, and time.       Assessment & Plan:   Type 2 diabetes mellitus with obesity (HCC) -     POCT glycosylated hemoglobin (Hb A1C) -     Microalbumin / creatinine urine ratio     Return in about 3 months (around 02/05/2024).     Jerrlyn Morel, NP 11/05/2023

## 2023-11-05 NOTE — Addendum Note (Signed)
 Addended by: Jerrlyn Morel on: 11/05/2023 09:19 AM   Modules accepted: Orders

## 2023-11-06 ENCOUNTER — Encounter: Payer: Self-pay | Admitting: Cardiovascular Disease

## 2023-11-06 ENCOUNTER — Encounter: Payer: Self-pay | Admitting: Pulmonary Disease

## 2023-11-06 ENCOUNTER — Ambulatory Visit (INDEPENDENT_AMBULATORY_CARE_PROVIDER_SITE_OTHER): Admitting: Licensed Clinical Social Worker

## 2023-11-06 DIAGNOSIS — F339 Major depressive disorder, recurrent, unspecified: Secondary | ICD-10-CM

## 2023-11-06 LAB — MICROALBUMIN / CREATININE URINE RATIO
Creatinine, Urine: 147.1 mg/dL
Microalb/Creat Ratio: 58 mg/g{creat} — ABNORMAL HIGH (ref 0–29)
Microalbumin, Urine: 85.5 ug/mL

## 2023-11-07 ENCOUNTER — Ambulatory Visit: Payer: Self-pay | Admitting: Nurse Practitioner

## 2023-11-08 ENCOUNTER — Other Ambulatory Visit: Payer: Self-pay

## 2023-11-12 ENCOUNTER — Encounter (HOSPITAL_COMMUNITY): Payer: Self-pay | Admitting: Licensed Clinical Social Worker

## 2023-11-12 NOTE — Progress Notes (Signed)
   THERAPIST PROGRESS NOTE  Session Time: 9:00am-9:55am  Participation Level: Active  Behavioral Response: Well GroomedAlertIrritable  Type of Therapy: Individual Therapy  Treatment Goals addressed: to cope with stress and manage depression and anxiety. Fedor will report improved stress management and coping skills, resulting in mood stability and less worrying 4 out of 7 days.   ProgressTowards Goals: Progressing  Interventions: CBT  Summary: Adetokunbo Mccadden is a 78 y.o. male who presents with MDD, recurrent, chronic.   Suicidal/Homicidal: Nowithout intent/plan  Therapist Response: Carlton engaged well in individual and person session with clinician.  Clinician utilized CBT to process thoughts feelings and interactions.  Clinician processed recent visit with daughter and grandchildren.  Clinician reflected joy and love following this visit.  Clinician explored relationship with daughter now, as compared to how they used to interact.  Clinician reflected challenges in forgiving his daughter for her past.  Clinician identified his daughter's choice to forgive him for his past.  Clinician explored future plans for daughter and her family.  Clinician discussed Quay's other challenges and frustrations regarding technology, bills, concerns about finances, etc.  Clinician explored coping strategies and noted improvement and positive self-talk and in taking 1 day at a time.  Plan: Return again in 2 weeks.  Diagnosis: Major depression, recurrent, chronic (HCC)  Collaboration of Care: Psychiatrist AEB provided updates to Dr. Tasia  Patient/Guardian was advised Release of Information must be obtained prior to any record release in order to collaborate their care with an outside provider. Patient/Guardian was advised if they have not already done so to contact the registration department to sign all necessary forms in order for us  to release information regarding their care.   Consent:  Patient/Guardian gives verbal consent for treatment and assignment of benefits for services provided during this visit. Patient/Guardian expressed understanding and agreed to proceed.   [name] [place], LCSW 11/12/2023

## 2023-11-13 ENCOUNTER — Other Ambulatory Visit: Payer: Self-pay

## 2023-11-13 NOTE — Patient Outreach (Signed)
 Complex Care Management   Visit Note  11/13/2023  Name:  Rodney Friedman MRN: [id number] DOB: 04-15-1946  Situation: Referral received for Complex Care Management related to Heart Failure and Atrial Fibrillation I obtained verbal consent from Patient.  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 Verlin & Dr. Lucas   Sleep apnea    Stroke [facility])    Substance abuse (HCC)    Tremor, essential 09/22/2015    Assessment: Patient Reported Symptoms:  Cognitive Cognitive Status: Able to follow simple commands, Alert and oriented to person, place, and time, Normal speech and language skills, Insightful and able to interpret abstract concepts      Neurological Neurological Review of Symptoms: No symptoms reported    HEENT HEENT Symptoms Reported: No symptoms  reported      Cardiovascular Does patient have uncontrolled Hypertension?: No Is patient checking Blood Pressure at home?: Yes Patient's Recent BP reading at home: 122/78 Cardiovascular Conditions: Coronary artery disease, Congenital, High blood cholesterol  Respiratory Respiratory Symptoms Reported: No symptoms reported    Endocrine Patient reports the following symptoms related to hypoglycemia or hyperglycemia : No symptoms reported Is patient diabetic?: Yes Is patient checking blood sugars at home?: No Endocrine Conditions: Diabetes  Gastrointestinal Gastrointestinal Symptoms Reported: No symptoms reported      Genitourinary Genitourinary Symptoms Reported: Incontinence Genitourinary Conditions: Prostate cancer Genitourinary Management Strategies: Incontinence garment/pad  Integumentary Integumentary Symptoms Reported: Other, Bruising Other Integumentary Symptoms: Petechiae Skin Conditions: Other Other Skin Conditions: from blood thinners  Musculoskeletal Musculoskelatal Symptoms Reviewed: Difficulty walking Musculoskeletal Conditions: Back pain, Osteoarthritis, Mobility limited Musculoskeletal Management Strategies: Medical device, Medication therapy, Exercise Falls in the past year?: Yes Number of falls in past year: 2 or more Was there an injury with Fall?: No Fall Risk Category Calculator: 2 Patient Fall Risk Level: Moderate Fall Risk Fall risk Follow up: Falls evaluation completed, Education provided, Falls prevention discussed (From slips doing yard work)  Psychosocial       Quality of Family Relationships: supportive Do you feel physically threatened by others?: No      11/13/2023   10:20 AM  Depression screen PHQ 2/9  Decreased Interest 1  Down, Depressed, Hopeless 0  PHQ - 2 Score 1    There were no vitals filed for this visit.  Medications Reviewed Today     Reviewed by Weyman Corning, RN (Registered Nurse) on 11/13/23 at 1005  Med List Status: <None>    Medication Order Taking? Sig Documenting Provider Last Dose Status Informant  Accu-Chek Softclix Lancets lancets [id number]  2 (two) times a week.  Patient not taking: Reported on 11/13/2023   [provider]  Active   acetaminophen  (TYLENOL ) 325 MG tablet [id number] Yes Take 1-2 tablets (325-650 mg total) by mouth every 4 (four) hours as needed for mild pain. Setzer, Sandra J, PA-C  Active   amoxicillin  (AMOXIL ) 500 MG capsule [id number] Yes Take 1 capsule (500 mg total) by mouth 2 (two) times daily.  Patient taking differently: Take 500 mg by mouth 2 (two) times daily. Only for dental work.   Oley Bascom RAMAN, NP  Active   apixaban  (ELIQUIS ) 5 MG TABS tablet [id number] Yes Take 1 tablet (5 mg total) by mouth 2 (two) times daily. Oley Bascom RAMAN, NP  Active   Blood Glucose Monitoring Suppl (ACCU-CHEK GUIDE) w/Device KIT [id number]  as directed.  Patient not taking: Reported on 11/13/2023   [provider]  Active   buPROPion  (WELLBUTRIN  SR) 200 MG 12 hr tablet [id number] Yes Take 1 tablet (200 mg total) by mouth every morning. Tasia Lung, MD  Active   buPROPion  ER (WELLBUTRIN  SR) 100 MG 12 hr tablet [id number] Yes 1 qhs Plovsky, Gerald, MD  Active   calcium  carbonate (TUMS - DOSED IN MG ELEMENTAL CALCIUM ) 500 MG chewable tablet [id number] Yes Chew 1 tablet by mouth 2 (two) times daily.  Patient taking differently: Chew 1 tablet by mouth 2 (two) times daily. Taking it as needed   [provider]  Active            Med Note MAYER, [facility] R   Thu Aug 09, 2023 12:36 PM) Taking prn   cyanocobalamin  (VITAMIN B12) 500 MCG tablet [id number] Yes Take 1 tablet (500 mcg total) by mouth every other day. Oley Bascom RAMAN, NP  Active   Denosumab (PROLIA Wells) [id number] Yes every 6 (six) months. [provider]  Active            Med Note DELSA LORAIN SQUIBB   Tue Oct 23, 2023 10:47 AM) Last dose Jun 29 2023  docusate sodium  (COLACE) 100 MG capsule [id number] Yes Take 1 capsule  (100 mg total) by mouth 2 (two) times daily. Oley Bascom RAMAN, NP  Active            Med Note SOUNDRA, EBONY J   Fri Oct 26, 2023  8:04 AM) As needed  ezetimibe  (ZETIA ) 10 MG tablet [id number] Yes Take 1 tablet (10 mg total) by mouth daily. Oley Bascom RAMAN, NP  Active   gabapentin  (NEURONTIN ) 300 MG capsule [id number] Yes TAKE 1 CAPSULE BY MOUTH EVERYDAY AT BEDTIME Nichols, Tonya S, NP  Active   ketoconazole  (NIZORAL ) 2 % shampoo [id number] Yes Apply 1 Application topically 2 (two) times a week. [provider]  Active   latanoprost  (XALATAN ) 0.005 % ophthalmic solution [id number] Yes Place 1 drop into the left eye every other day. [provider]  Active Self           Med Note ROLENE EVERN PULLING A   Wed Jan 03, 2023  2:36 PM) Patient states that  he needs a refill.  magnesium  oxide (MAG-OX) 400 (240 Mg) MG tablet [id number] Yes Take 400 mg by mouth daily. [provider]  Active   metFORMIN  (GLUCOPHAGE ) 500 MG tablet [id number] Yes Take 1 tablet (500 mg total) by mouth 2 (two) times daily with a meal. Oley Bascom RAMAN, NP  Active   metoprolol  succinate (TOPROL -XL) 50 MG 24 hr tablet [id number] Yes Take 1 tablet (50 mg total) by mouth 2 (two) times daily. Take with or immediately following a meal. Oley Bascom RAMAN, NP  Active   rosuvastatin  (CRESTOR ) 20 MG tablet [id number] Yes Take 1 tablet (20 mg total) by mouth daily. Oley Bascom RAMAN, NP  Active   valsartan  (DIOVAN ) 80 MG tablet [id number] Yes Take 1 tablet (80 mg total) by mouth daily. Croitoru, Jerel, MD  Active             Recommendation:   PCP Follow-up  Follow Up Plan:   Telephone follow up appointment date/time:  12/14/23  930 am  Wilbert Diver RN, BSN, [facility] [place]  [facility], [facility]    Care Coordinator Phone: [phone]

## 2023-11-13 NOTE — Patient Instructions (Signed)
 Visit Information  Thank you for taking time to visit with me today. Please don't hesitate to contact me if I can be of assistance to you before our next scheduled appointment.  Your next care management appointment is by telephone on 12/14/23 at 930  am  Please call the care guide team at [phone] if you need to cancel, schedule, or reschedule an appointment.   Please call 1-800-273-TALK (toll free, 24 hour hotline) call 911 if you are experiencing a Mental Health or Behavioral Health Crisis or need someone to talk to.  Wilbert Diver RN, BSN, [facility] [place]  [facility], [facility]    Care Coordinator Phone: [phone]

## 2023-11-16 ENCOUNTER — Other Ambulatory Visit: Payer: Self-pay

## 2023-11-16 ENCOUNTER — Encounter: Payer: Self-pay | Admitting: Hematology and Oncology

## 2023-11-16 ENCOUNTER — Other Ambulatory Visit: Payer: Self-pay | Admitting: Acute Care

## 2023-11-16 ENCOUNTER — Encounter: Payer: Self-pay | Admitting: Cardiovascular Disease

## 2023-11-16 DIAGNOSIS — Z87891 Personal history of nicotine dependence: Secondary | ICD-10-CM

## 2023-11-16 DIAGNOSIS — Z122 Encounter for screening for malignant neoplasm of respiratory organs: Secondary | ICD-10-CM

## 2023-11-16 NOTE — Patient Instructions (Signed)
 Visit Information  Thank you for taking time to visit with me today. Please don't hesitate to contact me if I can be of assistance to you before our next scheduled appointment.  Our next appointment is by telephone on 12/28/2023 at 9AM Please call the care guide team at [phone] if you need to cancel or reschedule your appointment.   Following is a copy of your care plan:   Goals Addressed             This Visit's Progress    BSW VBCI Social Work Care Plan   On track    Problems:   Patient wants to get medical alert (life alert) bracelet/system   CSW Clinical Goal(s):   Over the next 1 months the Patient will work with Child psychotherapist to address concerns related to medical alert bracelet/system and explore and consider different options..  Interventions:  SW will research options to cover medical alert bracelet at discounted price or full if possible. SW will gather different quotes from providers for patient.  Patient Goals/Self-Care Activities:  Patient will contact his [employer] provider and inquire about the possible assistance with this medical security device.   Plan:   Telephone follow up appointment with care management team member scheduled for:  12/28/2023 at 8AM.        Please call the Suicide and Crisis Lifeline: 988 go to [facility] [address], [place] [phone]) call 911 if you are experiencing a Mental Health or Behavioral Health Crisis or need someone to talk to.  Patient verbalizes understanding of instructions and care plan provided today and agrees to view in MyChart. Active MyChart status and patient understanding of how to access instructions and care plan via MyChart confirmed with patient.     Laymon Doll, BSW [place]/[facility] - [employer] Social Worker [phone]

## 2023-11-16 NOTE — Patient Outreach (Addendum)
 Complex Care Management   Visit Note  11/16/2023  Name:  Rodney Friedman MRN: [id number] DOB: 09-May-1946  Situation: Referral received for Complex Care Management related to need for a medical alert bracelet/system. I obtained verbal consent from Patient.  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 Verlin & Dr. 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 Pt was alert and cognitive. SDOH needs were assessed and no SDOH needs/barriers were identified. Pt confirmed he still has a private caregiver Lorrayne) that comes as often as he needs, about twice a week to assist with tasks as needed such as housework, but mainly transportation. Pt confirmed he  has a personal car and can drive, but is not all too familiar with [facility]. Pt states he only drives himself to places he knows how to get around and uses transportation for other places.   Pt states his need at the moment is obtaining a medical alert device/system. Pt states in the meantime he has other options like two friends who can help him if he needs help and can reach a phone. Pt also states he has two Alexa's systems setup in the home that he can rely on in emergencies. Pt would like help exploring all possible options for a medical alert device. During f/u call patient stated he felt overwhelmed with the amount of times he sees doctors for different health needs. Patient requested BSW follow up to be in August to allow time to explore options and pay down expenses. Pt requested SW to let Wilbert Diver know he left a VM for her requesting an email be sent to him with contact information to all his providers he regularly sees. SW confirmed he will pass along this message to [place]. Pt was provide direct contact # for SW and advised to call if he has other needs come up between now and his scheduled f/u. Pt understood and agreed. No additional resources were requested at  this time.   SDOH Interventions    Flowsheet Row Patient Outreach Telephone from 11/16/2023 in [place] POPULATION HEALTH DEPARTMENT Patient Outreach Telephone from 11/13/2023 in [place] POPULATION HEALTH DEPARTMENT Patient Outreach Telephone from 10/19/2023 in [place] POPULATION HEALTH DEPARTMENT Patient Outreach Telephone from 09/19/2023 in [place] HEALTH POPULATION HEALTH DEPARTMENT Office Visit from 07/27/2023 in [facility] - [facility] Dept Of [facility] Care Coordination from 06/22/2023 in [facility] Care Coordination  SDOH Interventions        Food Insecurity Interventions Intervention Not Indicated Intervention Not Indicated Intervention Not Indicated Intervention Not  Indicated -- --  Housing Interventions Intervention Not Indicated Intervention Not Indicated Intervention Not Indicated Intervention Not Indicated -- --  Transportation Interventions Intervention Not Indicated -- Intervention Not Indicated Intervention Not Indicated -- Intervention Not Indicated, Other (Comment)  [Provided contact number for [employer] [phone]  Utilities Interventions Intervention Not Indicated Intervention Not Indicated Intervention Not Indicated Intervention Not Indicated -- Intervention Not Indicated  Depression Interventions/Treatment  -- -- -- Currently on Treatment PHQ2-9 Score <4 Follow-up Not Indicated --  Financial Strain Interventions --  [provided Eliquis  Patient Assistance forms-otherwise doing well] -- -- -- -- --      Recommendation:   Patient will calll [employer] provider and inquire about possible full/partial coverage for medical alert device/system.  Follow Up Plan:   Telephone follow up appointment date/time:  12/28/2023 at 9AM.   Laymon Doll, BSW [place]/[facility] - [facility] Social Worker [phone]

## 2023-11-20 ENCOUNTER — Encounter (HOSPITAL_COMMUNITY): Payer: Self-pay

## 2023-11-20 ENCOUNTER — Ambulatory Visit (HOSPITAL_COMMUNITY): Admitting: Licensed Clinical Social Worker

## 2023-11-28 ENCOUNTER — Other Ambulatory Visit: Payer: Self-pay | Admitting: Nurse Practitioner

## 2023-11-28 DIAGNOSIS — I10 Essential (primary) hypertension: Secondary | ICD-10-CM

## 2023-12-14 ENCOUNTER — Other Ambulatory Visit: Payer: Self-pay

## 2023-12-14 NOTE — Patient Instructions (Signed)
 Visit Information  Thank you for taking time to visit with me today. Please don't hesitate to contact me if I can be of assistance to you before our next scheduled appointment.  Telephone follow up appointment with care management team member scheduled for:  02/14/24 930 am with Rosaline Finlay [facility] scheduled per patient       Please call the care guide team at [phone] if you need to cancel, schedule, or reschedule an appointment.   Please call the Suicide and Crisis Lifeline: 988 call the [place]  National Suicide Prevention Lifeline: (603)862-1971 or TTY: 952 100 4213 TTY (660)187-3318) to talk to a trained counselor call 1-800-273-TALK (toll free, 24 hour hotline) call 911 if you are experiencing a Mental Health or Behavioral Health Crisis or need someone to talk to.  Wilbert Diver RN, BSN, [facility]  [facility], [facility]    Care Coordinator Phone: [phone]

## 2023-12-14 NOTE — Patient Outreach (Signed)
 Complex Care Management   Visit Note  12/14/2023  Name:  Rodney Friedman MRN: [id number] DOB: 16-Dec-1945  Situation: Referral received for Complex Care Management related to Atrial Fibrillation and HTN I obtained verbal consent from Patient.  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 Verlin & Dr. 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 His blood pressure readings have been stable, ranging from 127/85 to 132/88. He denied experiencing any headaches, chest pains, or shortness of breath. His heart rate has fluctuated between 72 and 74 beats per minute, and his oxygen saturation levels have  consistently remained around 98%. There have been no reports of heart racing or palpitations.  Regarding his blood sugar management, Rodney Friedman's A1C level was recorded at 6.2 as of June 16. He expressed a reluctance to perform finger sticks for blood sugar checks, which has resulted in him not monitoring his levels recently. He inquired about the possibility of acquiring a device akin to a pulse oximeter that would allow for non-invasive blood sugar measurements. He is aware of the significance of regularly checking his blood sugar but seeks alternative methods.  Additionally, I informed Rodney Friedman that I will no longer serve as his nurse case manager; Rosaline Finlay will be assuming this role going forward. He requested to defer his upcoming appointment by a couple of months due to the presence of several other engagements, indicating a need for some respite. In accordance with his request, we have rescheduled his appointment for September. Patient Reported Symptoms:  Cognitive Cognitive Status: Able to follow simple commands, Alert and oriented to person, place, and time, Normal speech and language skills      Neurological Neurological Review of Symptoms: No symptoms reported    HEENT HEENT Symptoms Reported: No symptoms reported      Cardiovascular Does patient have uncontrolled Hypertension?: No Is patient checking Blood Pressure at home?: No Cardiovascular Management  Strategies: Medication therapy  Respiratory Respiratory Symptoms Reported: No symptoms reported Respiratory Management Strategies: Medication therapy  Endocrine Is patient diabetic?: Yes Is patient checking blood sugars at home?: No Endocrine Comment: He will not check his blood sugar because he does not want to stick his finger  Gastrointestinal Gastrointestinal Symptoms Reported: No symptoms reported      Genitourinary Genitourinary Symptoms Reported: No symptoms reported    Integumentary Integumentary  Symptoms Reported: Bruising Other Integumentary Symptoms: Petechia due to the blood thinner Skin Management Strategies: Medication therapy  Musculoskeletal Musculoskelatal Symptoms Reviewed: Difficulty walking, Unsteady gait, Joint pain Musculoskeletal Management Strategies: Medical device, Medication therapy Musculoskeletal Comment: USes a cane if needed Falls in the past year?: Yes Number of falls in past year: 2 or more Was there an injury with Fall?: No Fall Risk Category Calculator: 2 Patient Fall Risk Level: Moderate Fall Risk Patient at Risk for Falls Due to: Impaired mobility (From working in yard or triping over objects) Fall risk Follow up: Falls evaluation completed, Education provided, Falls prevention discussed  Psychosocial       Quality of Family Relationships: supportive Do you feel physically threatened by others?: No      12/14/2023    9:55 AM  Depression screen PHQ 2/9  Decreased Interest 0  Down, Depressed, Hopeless 3  PHQ - 2 Score 3  Altered sleeping 0  Tired, decreased energy 0  Change in appetite 3  Feeling bad or failure about yourself  0  Trouble concentrating 0  Moving slowly or fidgety/restless 0  Suicidal thoughts 0  PHQ-9 Score 6    There were no vitals filed for this visit.  Medications Reviewed Today     Reviewed by Weyman Corning, RN (Registered Nurse) on 12/14/23 at [phone]  Med List Status: <None>   Medication Order Taking? Sig Documenting Provider Last Dose Status Informant  Accu-Chek Softclix Lancets lancets [id number]  2 (two) times a week.  Patient not taking: Reported on 11/13/2023   [provider]  Active   acetaminophen  (TYLENOL ) 325 MG tablet [id number] Yes Take 1-2 tablets (325-650 mg total) by mouth every 4 (four) hours as needed for mild pain. Setzer, Nena PARAS, PA-C  Active   amoxicillin  (AMOXIL ) 500 MG capsule [id number]  Take 1 capsule (500 mg total) by mouth 2 (two) times daily.  Patient taking differently: Take 500 mg  by mouth 2 (two) times daily. Only for dental work.   Oley Bascom RAMAN, NP  Active   apixaban  (ELIQUIS ) 5 MG TABS tablet [id number] Yes Take 1 tablet (5 mg total) by mouth 2 (two) times daily. Oley Bascom RAMAN, NP  Active   Blood Glucose Monitoring Suppl (ACCU-CHEK GUIDE) w/Device KIT [id number]  as directed.  Patient not taking: Reported on 11/13/2023   [provider]  Active   buPROPion  (WELLBUTRIN  SR) 200 MG 12 hr tablet [id number] Yes Take 1 tablet (200 mg total) by mouth every morning. Tasia Lung, MD  Active   buPROPion  ER (WELLBUTRIN  SR) 100 MG 12 hr tablet [id number] Yes 1 qhs Plovsky, Gerald, MD  Active   calcium  carbonate (TUMS - DOSED IN MG ELEMENTAL CALCIUM ) 500 MG chewable tablet [id number] Yes Chew 1 tablet by mouth 2 (two) times daily. [provider]  Active            Med Note MAYER, [facility] R   Thu Aug 09, 2023 12:36 PM) Taking prn   cyanocobalamin  (VITAMIN B12) 500 MCG tablet [id number] Yes Take 1 tablet (500 mcg total)  by mouth every other day. Oley Bascom RAMAN, NP  Active   Denosumab (PROLIA [place]) [id number] Yes every 6 (six) months. [provider]  Active            Med Note DELSA LORAIN SQUIBB   Tue Oct 23, 2023 10:47 AM) Last dose Jun 29 2023  docusate sodium  (COLACE) 100 MG capsule [id number] Yes Take 1 capsule (100 mg total) by mouth 2 (two) times daily. Oley Bascom RAMAN, NP  Active            Med Note SOUNDRA, EBONY J   Fri Oct 26, 2023  8:04 AM) As needed  ezetimibe  (ZETIA ) 10 MG tablet [id number] Yes Take 1 tablet (10 mg total) by mouth daily. Oley Bascom RAMAN, NP  Active   gabapentin  (NEURONTIN ) 300 MG capsule [id number] Yes TAKE 1 CAPSULE BY MOUTH EVERYDAY AT BEDTIME Nichols, Tonya S, NP  Active   ketoconazole  (NIZORAL ) 2 % shampoo [id number] Yes Apply 1 Application topically 2 (two) times a week. [provider]  Active   latanoprost  (XALATAN ) 0.005 % ophthalmic solution [id number] Yes Place 1 drop into the left eye every other day. [provider]  Active Self           Med Note ROLENE EVERN PULLING A   Wed Jan 03, 2023  2:36 PM) Patient states that he needs a refill.  magnesium  oxide (MAG-OX) 400 (240 Mg) MG tablet [id number] Yes Take 400 mg by mouth daily. [provider]  Active   metFORMIN  (GLUCOPHAGE ) 500 MG tablet [id number] Yes Take 1 tablet (500 mg total) by mouth 2 (two) times daily with a meal. Oley Bascom RAMAN, NP  Active   metoprolol  succinate (TOPROL -XL) 50 MG 24 hr tablet [id number] Yes TAKE 1 TABLET (50 MG TOTAL) BY MOUTH TWICE A DAY .TAKE WITH OR IMMEDIATELY FOLLOWING A MEAL Oley Bascom RAMAN, NP  Active   rosuvastatin  (CRESTOR ) 20 MG tablet [id number] Yes Take 1 tablet (20 mg total) by mouth daily. Oley Bascom RAMAN, NP  Active   valsartan  (DIOVAN ) 80 MG tablet [id number] Yes Take 1 tablet (80 mg total) by mouth daily. Croitoru, Jerel, MD  Active             Recommendation:   Continue Current Plan of Care  Follow Up Plan:   Telephone follow up appointment with care management team member scheduled for:  02/14/24 930 am with Rosaline Finlay RNCM scheduled per patient  Wilbert Diver RN, BSN, [facility] [place]  [facility], [facility]    Care Coordinator Phone: [phone]

## 2023-12-16 ENCOUNTER — Other Ambulatory Visit (HOSPITAL_BASED_OUTPATIENT_CLINIC_OR_DEPARTMENT_OTHER): Payer: Self-pay | Admitting: Urology

## 2023-12-16 DIAGNOSIS — M810 Age-related osteoporosis without current pathological fracture: Secondary | ICD-10-CM

## 2023-12-18 ENCOUNTER — Encounter (HOSPITAL_COMMUNITY): Payer: Self-pay | Admitting: Licensed Clinical Social Worker

## 2023-12-18 ENCOUNTER — Ambulatory Visit (INDEPENDENT_AMBULATORY_CARE_PROVIDER_SITE_OTHER): Admitting: Licensed Clinical Social Worker

## 2023-12-18 DIAGNOSIS — F339 Major depressive disorder, recurrent, unspecified: Secondary | ICD-10-CM | POA: Diagnosis not present

## 2023-12-18 NOTE — Progress Notes (Signed)
   THERAPIST PROGRESS NOTE  Session Time: 10:00am-10:55am  Participation Level: Active  Behavioral Response: Well GroomedAlertEuthymic and Irritable  Type of Therapy: Individual Therapy  Treatment Goals addressed: to cope with stress and manage depression and anxiety. Bryn will report improved stress management and coping skills, resulting in mood stability and less worrying 4 out of 7 days.   ProgressTowards Goals: Progressing  Interventions: Solution Focused  Summary: Rodney Friedman is a 78 y.o. male who presents with MDD, recurrent, chronic.   Suicidal/Homicidal: Nowithout intent/plan  Therapist Response: Rodney Friedman engaged well in individual in person session with clinician. Clinician utilized solution focused therapy to process interactions, mood, and thought processes. Clinician identified some changes and stresses about finances, maintaining his budget, and coping with paperwork. Clinician identified this continues to be a daily struggle and frustration levels run high. Clinician reflected the amount of time Rodney Friedman spends on a daily basis worrying and coping with these different agencies and organizations. Rodney Friedman shared that he is pleased with his new care worker, who has helped him with updating processes and technology. However, he shared he continues to stay in his zone of keeping paper copies of documents, address book, and billing statements. Clinician reflected the importance of maintaining control of his paperwork and being able to stay organized without being overwhelmed.   Plan: Return again in 2-4 weeks.  Diagnosis: Major depression, recurrent, chronic (HCC)  Collaboration of Care: Psychiatrist AEB updated Dr. Tasia  Patient/Guardian was advised Release of Information must be obtained prior to any record release in order to collaborate their care with an outside provider. Patient/Guardian was advised if they have not already done so to contact the registration  department to sign all necessary forms in order for us  to release information regarding their care.   Consent: Patient/Guardian gives verbal consent for treatment and assignment of benefits for services provided during this visit. Patient/Guardian expressed understanding and agreed to proceed.   [name] [place], LCSW 12/18/2023

## 2023-12-24 ENCOUNTER — Other Ambulatory Visit: Payer: Self-pay

## 2023-12-24 DIAGNOSIS — I10 Essential (primary) hypertension: Secondary | ICD-10-CM

## 2023-12-24 NOTE — Progress Notes (Signed)
 12/24/2023 Name: Nils Thor MRN: [id number] DOB: 11-Jan-1946  Chief Complaint  Patient presents with   Hypertension    Josel Keo is a 78 y.o. year old male who presented for a telephone visit.   They were referred to the pharmacist by their PCP for assistance in managing hypertension and medication access. 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 At this time, he reported home BP in the 140s/80s. He was seeing cardiology in a couple days, and Dr. Francyne started the patient on valsartan  80 mg daily as recommended. Scr at baseline was 1.24 mg/g and K was 4.8 mEq/L. No other medication changes were made. Patient was last seen by PCP, Bascom Borer, NP on 11/05/23. BP was 122/78 mmHg. A1C decreased from 6.6% to 6.2%. UACR was elevatd at 58 mg/g.   Today, patient reports doing well. He reports that he is tolerating valsartan  well, and it seems to be controlling his BP.  Care Team: Primary Care Provider: Borer Bascom RAMAN, NP ; Next Scheduled Visit: 02/07/24 Cardiologist: Francyne Headland, MD ; Next Scheduled Visit: 10/26/2023  Medication Access/Adherence  Current Pharmacy:  [id number] [address], [place] - [address] [place] [address] Phone: [phone] Fax: [phone]   Patient reports affordability concerns with their medications: Yes  - Eliquis  is expensive, but he is able to budget Patient reports access/transportation concerns to their pharmacy: No  Patient reports adherence concerns with their medications:  No  - denies missed doses of his medications   Diabetes:  Current medications: metformin  IR 500 mg twice daily   Current glucose readings: N/A  - He does report that he has a glucometer (Accu Chek Guide) but he does not check his blood glucose. He often feels overwhelmed  with trying to manage his health concerns such as seeing PT, prostate cancer management, behavioral health for depression and anxiety, etc.   Patient denies hypoglycemic s/sx including dizziness, shakiness, sweating. Patient reports hyperglycemic symptoms including nocturia (but has baseline prostate issues).  Current meal patterns:  - no changes in diet from last encounter, lots of macaroni, happily reports that he has cut out ice cream - Drinks coffee, unsweetened tea, bottled water, Premier protein, diet coke/Pepsi 2-3x per week, OJ (on occasion)  - He avoids sweetened beverages   Current physical activity: Physical Therapy once weekly, doing more physical activity in the yard as well,   Hypertension:  Current medications: Metoprolol  XL 50 mg twice daily, valsartan  80 mg daily Previously tried: amlodipine , valsartan  - were both d/c'd in 2022 after pt had issues with hypotension following TAVR in 2022  Patient has a validated, automated, upper arm home BP cuff - reports SBP 128, 125 mmHg per memory (beginning of last week), and DBP 78 +/- 5 mmHg.   Patient denies hypotensive s/sx including dizziness, lightheadedness.  Patient denies hypertensive symptoms including CP, blurry vision, HA.  Patient reports shortness of breath while doing yard work, but this resolves with rest (h/o COPD)  Hyperlipidemia/ASCVD Risk Reduction  Current lipid lowering medications: ezetimibe  10 mg daily, rosuvastatin  20 mg daily   Objective:  BP Readings from Last 3 Encounters:  11/05/23 122/78  10/26/23 (!) 142/84  09/04/23 (!) 157/93    Lab Results  Component Value Date   HGBA1C 6.2 (A) 11/05/2023   HGBA1C 6.6 (A) 07/27/2023  HGBA1C 6.5 04/26/2023    Lab Results  Component Value Date   CREATININE 1.24 10/26/2023   BUN 16 10/26/2023   NA 143 10/26/2023   K 4.8 10/26/2023   CL 107 (H) 10/26/2023   CO2 21 10/26/2023    Lab Results  Component Value Date   CHOL 95 (L) 10/26/2023   HDL  36 (L) 10/26/2023   LDLCALC 38 10/26/2023   TRIG 118 10/26/2023   CHOLHDL 2.6 10/26/2023    Medications Reviewed Today     Reviewed by Brinda Lorain SQUIBB, RPH (Pharmacist) on 12/24/23 at 0919  Med List Status: <None>   Medication Order Taking? Sig Documenting Provider Last Dose Status Informant   Patient not taking:   Discontinued 12/24/23 0918 (Patient Preference)   acetaminophen  (TYLENOL ) 325 MG tablet [id number]  Take 1-2 tablets (325-650 mg total) by mouth every 4 (four) hours as needed for mild pain. Setzer, Nena PARAS, PA-C  Active   amoxicillin  (AMOXIL ) 500 MG capsule [id number]  Take 1 capsule (500 mg total) by mouth 2 (two) times daily.  Patient taking differently: Take 500 mg by mouth 2 (two) times daily. Only for dental work.   Oley Bascom RAMAN, NP  Active   apixaban  (ELIQUIS ) 5 MG TABS tablet [id number] Yes Take 1 tablet (5 mg total) by mouth 2 (two) times daily. Oley Bascom RAMAN, NP  Active    Patient not taking:   Discontinued 12/24/23 0918 (Patient Preference)   buPROPion  (WELLBUTRIN  SR) 200 MG 12 hr tablet [id number] Yes Take 1 tablet (200 mg total) by mouth every morning. Tasia Lung, MD  Active   buPROPion  ER (WELLBUTRIN  SR) 100 MG 12 hr tablet [id number] Yes 1 qhs Plovsky, Gerald, MD  Active   calcium  carbonate (TUMS - DOSED IN MG ELEMENTAL CALCIUM ) 500 MG chewable tablet [id number]  Chew 1 tablet by mouth 2 (two) times daily. [provider]  Active            Med Note MAYER, [facility] R   Thu Aug 09, 2023 12:36 PM) Taking prn   cyanocobalamin  (VITAMIN B12) 500 MCG tablet [id number]  Take 1 tablet (500 mcg total) by mouth every other day. Oley Bascom RAMAN, NP  Active   Denosumab (PROLIA [place]) [id number]  every 6 (six) months. [provider]  Active            Med Note DELSA LORAIN SQUIBB   Tue Oct 23, 2023 10:47 AM) Last dose Jun 29 2023  docusate sodium  (COLACE) 100 MG capsule [id number]  Take 1 capsule (100 mg total) by mouth 2 (two) times daily. Oley Bascom RAMAN, NP   Active            Med Note SOUNDRA, EBONY J   Fri Oct 26, 2023  8:04 AM) As needed  ezetimibe  (ZETIA ) 10 MG tablet [id number] Yes Take 1 tablet (10 mg total) by mouth daily. Oley Bascom RAMAN, NP  Active   gabapentin  (NEURONTIN ) 300 MG capsule [id number]  TAKE 1 CAPSULE BY MOUTH EVERYDAY AT BEDTIME Nichols, Tonya S, NP  Active   ketoconazole  (NIZORAL ) 2 % shampoo [id number]  Apply 1 Application topically 2 (two) times a week. [provider]  Active   latanoprost  (XALATAN ) 0.005 % ophthalmic solution [id number]  Place 1 drop into the left eye every other day. [provider]  Active Self           Med Note ROLENE EVERN PULLING A   Wed Jan 03, 2023  2:36 PM)  Patient states that he needs a refill.  magnesium  oxide (MAG-OX) 400 (240 Mg) MG tablet [id number]  Take 400 mg by mouth daily. [provider]  Active   metFORMIN  (GLUCOPHAGE ) 500 MG tablet [id number] Yes Take 1 tablet (500 mg total) by mouth 2 (two) times daily with a meal. Oley Bascom RAMAN, NP  Active   metoprolol  succinate (TOPROL -XL) 50 MG 24 hr tablet [id number] Yes TAKE 1 TABLET (50 MG TOTAL) BY MOUTH TWICE A DAY .TAKE WITH OR IMMEDIATELY FOLLOWING A MEAL Oley Bascom RAMAN, NP  Active   rosuvastatin  (CRESTOR ) 20 MG tablet [id number] Yes Take 1 tablet (20 mg total) by mouth daily. Oley Bascom RAMAN, NP  Active   valsartan  (DIOVAN ) 80 MG tablet [id number] Yes Take 1 tablet (80 mg total) by mouth daily. Croitoru, Mihai, MD  Active               Assessment/Plan:   Diabetes: - Currently controlled with last A1C of 6.2% below goal < 7%. With CAD history and microalbuminuria, patient is a good candidate for SGTLi, however he has cost barriers with brand name medications. He is now appropriately treated with an ARB. Will continue to monitor kidney function for continuation/dose of metformin .  - UACR 11/05/23: 58 mg/g - Reviewed long term cardiovascular and renal outcomes of uncontrolled blood sugar - Reviewed goal  A1c, goal fasting, and goal 2 hour post prandial glucose - Reviewed dietary modifications such as monitoring carbohydrate intake like pastas  - Recommend to continue metformin  IR 500 mg BID - Next A1C due Sept 2025   Hypertension: - Currently controlled below goal with home and clinic readings < 130/80 mmHg since starting low-dose valsartan . He appears to be tolerating his current regimen well. - Reviewed long term cardiovascular and renal outcomes of uncontrolled blood pressure - Reviewed appropriate blood pressure monitoring technique and reviewed goal blood pressure. Recommended to check home blood pressure and heart rate once weekly on Thursday with the goal to do three times week as he is more compliant. Record on paper as patient does not have smart phone.  - Recommend to continue metoprolol  succinate 50 mg BID, valsartan  80 mg daily - Patient is due for repeat BMP after starting valsartan . He declines making lab appt prior to PCP visit on 02/07/24. Will defer labs per patient preference. Scr and K were stable and WNL at baseline.   Hyperlipidemia/ASCVD Risk Reduction: - Currently controlled with LDL-C below goal < 55 mg/dL given extensive ASCVD history. - Reviewed long term complications of uncontrolled cholesterol - Recommend to continue rosuvastatin  20 mg daily, ezetimibe  10 mg daily   Follow Up Plan: PCP 02/07/24, PharmD telephone 02/12/24   Lorain Baseman, PharmD [facility] [phone]

## 2023-12-28 ENCOUNTER — Telehealth

## 2024-01-02 ENCOUNTER — Other Ambulatory Visit: Payer: Self-pay

## 2024-01-02 ENCOUNTER — Encounter (HOSPITAL_COMMUNITY): Payer: Self-pay | Admitting: Psychiatry

## 2024-01-02 ENCOUNTER — Ambulatory Visit (HOSPITAL_COMMUNITY): Admitting: Psychiatry

## 2024-01-02 VITALS — BP 158/103 | HR 98 | Ht 68.0 in | Wt 203.0 lb

## 2024-01-02 DIAGNOSIS — F325 Major depressive disorder, single episode, in full remission: Secondary | ICD-10-CM

## 2024-01-02 MED ORDER — BUPROPION HCL ER (SR) 200 MG PO TB12: 200.0000 mg | ORAL_TABLET | Freq: Every morning | ORAL | 2 refills | Status: DC

## 2024-01-02 MED ORDER — GABAPENTIN 300 MG PO CAPS: 300.0000 mg | ORAL_CAPSULE | Freq: Every day | ORAL | 2 refills | Status: DC

## 2024-01-02 MED ORDER — BUPROPION HCL ER (SR) 100 MG PO TB12: ORAL_TABLET | ORAL | 4 refills | Status: DC
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
## 2024-01-22 ENCOUNTER — Encounter (HOSPITAL_COMMUNITY): Payer: Self-pay | Admitting: Licensed Clinical Social Worker

## 2024-01-22 ENCOUNTER — Encounter (HOSPITAL_COMMUNITY): Payer: Self-pay

## 2024-01-22 ENCOUNTER — Telehealth: Payer: Self-pay

## 2024-01-22 ENCOUNTER — Ambulatory Visit (INDEPENDENT_AMBULATORY_CARE_PROVIDER_SITE_OTHER): Admitting: Licensed Clinical Social Worker

## 2024-01-22 DIAGNOSIS — F339 Major depressive disorder, recurrent, unspecified: Secondary | ICD-10-CM | POA: Diagnosis not present

## 2024-01-22 NOTE — Telephone Encounter (Signed)
 Sent patient to medical records

## 2024-01-22 NOTE — Telephone Encounter (Signed)
 Rodney Friedman

## 2024-01-22 NOTE — Progress Notes (Signed)
   THERAPIST PROGRESS NOTE  Session Time: 9:00am-9:55am  Participation Level: Active  Behavioral Response: Well GroomedAlertAnxious, Depressed, and Irritable  Type of Therapy: Individual Therapy  Treatment Goals addressed: Problem: OP Depression  Dates: Start:  01/22/24    Disciplines: Interdisciplinary, PROVIDER  Goal: LTG: Increase coping skills to manage depression and improve ability to perform daily activities  Dates: Start:  01/22/24   Expected End:  07/21/24    Disciplines: Interdisciplinary, PROVIDER  Intervention: Work with [name] to identify the major components of a recent episode of depression: physical symptoms, major thoughts and images, and major behaviors they experienced  Dates: Start:  01/22/24      ProgressTowards Goals: Progressing  Interventions: Motivational Interviewing  Summary: Rodney Friedman is a 78 y.o. male who presents with MDD, recurrent, chronic.   Suicidal/Homicidal: Nowithout intent/plan  Therapist Response: [name] engaged well in individual in person session with clinician. Clinician utilized MI OARS to reflect and summarize thoughts, feelings, and interactions. Clinician provided time and space for [name] to share updates and frustrations with his health, his finances, and his fears about the future. Clinician identified the continuing struggle to keep up with technology. Clinician also processed ways that [name] can maintain his sense of gratitude and grounding when he becomes irate and disgusted with the world and the high level of responsibility he feels for managing his stuff. Clinician discussed the value of being able to manage these issues, despite the fact that he does not have much support from his family.   Plan: Return again in 2-3 weeks.  Diagnosis: Major depression, recurrent, chronic (HCC)  Collaboration of Care: Psychiatrist AEB reviewed note from Dr. Tasia  Patient/Guardian was advised Release of Information must be  obtained prior to any record release in order to collaborate their care with an outside provider. Patient/Guardian was advised if they have not already done so to contact the registration department to sign all necessary forms in order for us  to release information regarding their care.   Consent: Patient/Guardian gives verbal consent for treatment and assignment of benefits for services provided during this visit. Patient/Guardian expressed understanding and agreed to proceed.   [name] [place], LCSW 01/22/2024

## 2024-01-23 ENCOUNTER — Telehealth: Payer: Self-pay

## 2024-01-23 NOTE — Telephone Encounter (Signed)
 Copied from CRM #[id number]. Topic: Clinical - Lab/Test Results >> Jan 22, 2024 12:30 PM Rozanna G wrote: Reason for CRM: PT CALLING STATED HE CAN NOT FIND HIS RESULTS FROM HIS LUNG SCREENING THAT WAS DONE IN JUNE OF THIS YEAR. HE STATED HE CAN'T FIND IT IN MY CHART. STATED HE CAN'T FIND IT. THANKS  Can you please advise [facility] CT results

## 2024-02-05 ENCOUNTER — Ambulatory Visit (INDEPENDENT_AMBULATORY_CARE_PROVIDER_SITE_OTHER): Admitting: Licensed Clinical Social Worker

## 2024-02-05 DIAGNOSIS — F339 Major depressive disorder, recurrent, unspecified: Secondary | ICD-10-CM | POA: Diagnosis not present

## 2024-02-05 NOTE — Progress Notes (Unsigned)
   THERAPIST PROGRESS NOTE  Session Time: 9:00am-9:55am  Participation Level: Active  Behavioral Response: Well GroomedAlertDepressed  Type of Therapy: Individual Therapy  Treatment Goals addressed: Problem: OP Depression  Dates: Start:  01/22/24    Disciplines: Interdisciplinary, PROVIDER  Goal: LTG: Increase coping skills to manage depression and improve ability to perform daily activities  Dates: Start:  01/22/24   Expected End:  07/21/24    Disciplines: Interdisciplinary, PROVIDER  Intervention: Work with [name] to identify the major components of a recent episode of depression: physical symptoms, major thoughts and images, and major behaviors they experienced  Dates: Start:  01/22/24      ProgressTowards Goals: {Progress Towards Goals:21014066}  Interventions: {CHL AMB BH Type of Intervention:21022753}  Summary: Kyrel Leighton is a 78 y.o. male who presents with ***.   Suicidal/Homicidal: {BHH YES OR NO:22294}{yes/no/with/without intent/plan:22693}  Therapist Response: ***  Plan: Return again in *** weeks.  Diagnosis: No diagnosis found.  Collaboration of Care: {BH OP Collaboration of Care:21014065}  Patient/Guardian was advised Release of Information must be obtained prior to any record release in order to collaborate their care with an outside provider. Patient/Guardian was advised if they have not already done so to contact the registration department to sign all necessary forms in order for us  to release information regarding their care.   Consent: Patient/Guardian gives verbal consent for treatment and assignment of benefits for services provided during this visit. Patient/Guardian expressed understanding and agreed to proceed.   [name] Osmond, LCSW 02/05/2024

## 2024-02-07 ENCOUNTER — Encounter: Payer: Self-pay | Admitting: Nurse Practitioner

## 2024-02-07 ENCOUNTER — Ambulatory Visit (INDEPENDENT_AMBULATORY_CARE_PROVIDER_SITE_OTHER): Payer: Self-pay | Admitting: Nurse Practitioner

## 2024-02-07 VITALS — BP 133/86 | HR 82 | Temp 97.9°F | Wt 203.0 lb

## 2024-02-07 DIAGNOSIS — E1169 Type 2 diabetes mellitus with other specified complication: Secondary | ICD-10-CM | POA: Diagnosis not present

## 2024-02-07 DIAGNOSIS — E669 Obesity, unspecified: Secondary | ICD-10-CM

## 2024-02-07 DIAGNOSIS — L819 Disorder of pigmentation, unspecified: Secondary | ICD-10-CM

## 2024-02-07 LAB — POCT GLYCOSYLATED HEMOGLOBIN (HGB A1C): Hemoglobin A1C: 6.6 % — AB (ref 4.0–5.6)

## 2024-02-07 NOTE — Progress Notes (Signed)
 Subjective   Patient ID: Rodney Friedman, male    DOB: 1946-03-17, 78 y.o.   MRN: [id number]  Chief Complaint  Patient presents with   Hypertension    Referring provider: Oley Bascom RAMAN, NP  Rodney Friedman is a 78 y.o. male with Past Medical History: No date: Adenomatous colon polyp No date: Alcoholism (HCC) No date: Allergy No date: Anxiety No date: Arthritis No date: Atrial fibrillation (HCC) No date: CAD (coronary artery disease) No date: Cataract     Comment:  removed left eye  12/16/2020: CHF (congestive heart failure) (HCC) 07/08/2013: CLL (chronic lymphocytic leukemia) (HCC) No date: Clotting disorder (HCC) No date: Degenerative arthritis of lumbar spine No date: Degenerative disc disease, lumbar No date: Depression No date: Diabetes mellitus without complication (HCC) No date: Diverticulosis No date: Dyspnea No date: Dysrhythmia No date: Eye abnormality     Comment:  Macular scarring R eye No date: Glaucoma 2002: Heart murmur     Comment:  diagnosed about 20 years ago. Pt says it causes               abnormal EKGs No date: HLD (hyperlipidemia) No date: Hypertension No date: Leukemia [facility])     Comment:  CLL 2006: Myocardial infarction (HCC) No date: Osteopenia No date: Osteoporosis 07/08/2013: Prostate cancer (HCC)     Comment:  [facility] at [facility]- getting Lupron  shot every 6               months - traces in prostate and 2 lymphnodes left hip-               non focused traces per pt  02/08/2021: S/P TAVR (transcatheter aortic valve replacement)     Comment:  s/p TAVR with a 23mm Edwards S3U via the TF approach by               Dr. Verlin & Dr. Lucas No date: Sleep apnea No date: Stroke [facility]) No date: Substance abuse (HCC) 09/22/2015: Tremor, essential   HPI  Patient presents today for routine follow-up visit. Overall he has been stable since his last visit here. Does need oral surgery in August. Patient is following  with cardiology and is taking eliquis . Has been restarted on valsartan . He is following with urology to monitor elevated PSA level. He has followed with oncology. Patient does have a history of smoking but no longer smokes. A1C in office today is 6.6. Has started prolia. Denies f/c/s, n/v/d, hemoptysis, PND, leg swelling. Denies chest pain or edema.     Allergies  Allergen Reactions   Other Rash    Allergen: Plants and bushes while doing yard work   [employer] Nausea And Vomiting   Poison Ivy Extract Rash    Immunization History  Administered Date(s) Administered   Fluad Quad(high Dose 65+) 01/22/2019, 01/25/2023   INFLUENZA, HIGH DOSE SEASONAL PF 02/15/2018, 01/22/2020, 01/23/2022   Influenza Split 02/17/2015   Influenza, Quadrivalent, Recombinant, Inj, Pf 01/22/2019   Influenza,inj,Quad PF,6+ Mos 02/11/2016, 02/08/2017   Influenza-Unspecified 02/17/2015, 01/22/2019, 01/23/2022   Moderna Sars-Covid-2 Vaccination 08/20/2019, 09/17/2019, 03/25/2020   Pneumococcal Conjugate-13 02/24/2014   Pneumococcal Polysaccharide-23 09/08/2015   Tdap 09/08/2015   Unspecified SARS-COV-2 Vaccination 08/20/2019   Zoster Recombinant(Shingrix) 02/23/2022, 04/26/2022   Zoster, Live 05/22/2013    Tobacco History: Social History   Tobacco Use  Smoking Status Former   Current packs/day: 0.00   Average packs/day: 0.8 packs/day for 59.0 years (44.3 ttl pk-yrs)   Types: Cigarettes  Start date: 12/16/1961   Quit date: 12/16/2020   Years since quitting: 3.1   Passive exposure: Past  Smokeless Tobacco Never   Counseling given: Not Answered   Outpatient Encounter Medications as of 02/07/2024  Medication Sig   acetaminophen  (TYLENOL ) 325 MG tablet Take 1-2 tablets (325-650 mg total) by mouth every 4 (four) hours as needed for mild pain.   apixaban  (ELIQUIS ) 5 MG TABS tablet Take 1 tablet (5 mg total) by mouth 2 (two) times daily.   buPROPion  (WELLBUTRIN  SR) 200 MG 12 hr tablet Take 1 tablet (200 mg  total) by mouth every morning.   buPROPion  ER (WELLBUTRIN  SR) 100 MG 12 hr tablet 1 qhs   calcium  carbonate (TUMS - DOSED IN MG ELEMENTAL CALCIUM ) 500 MG chewable tablet Chew 1 tablet by mouth 2 (two) times daily.   cyanocobalamin  (VITAMIN B12) 500 MCG tablet Take 1 tablet (500 mcg total) by mouth every other day.   Denosumab (PROLIA [name]) every 6 (six) months.   docusate sodium  (COLACE) 100 MG capsule Take 1 capsule (100 mg total) by mouth 2 (two) times daily.   ezetimibe  (ZETIA ) 10 MG tablet Take 1 tablet (10 mg total) by mouth daily.   gabapentin  (NEURONTIN ) 300 MG capsule Take 1 capsule (300 mg total) by mouth daily.   ketoconazole  (NIZORAL ) 2 % shampoo Apply 1 Application topically 2 (two) times a week.   latanoprost  (XALATAN ) 0.005 % ophthalmic solution Place 1 drop into the left eye every other day.   magnesium  oxide (MAG-OX) 400 (240 Mg) MG tablet Take 400 mg by mouth daily.   metFORMIN  (GLUCOPHAGE ) 500 MG tablet Take 1 tablet (500 mg total) by mouth 2 (two) times daily with a meal.   metoprolol  succinate (TOPROL -XL) 50 MG 24 hr tablet TAKE 1 TABLET (50 MG TOTAL) BY MOUTH TWICE A DAY .TAKE WITH OR IMMEDIATELY FOLLOWING A MEAL   rosuvastatin  (CRESTOR ) 20 MG tablet Take 1 tablet (20 mg total) by mouth daily.   valsartan  (DIOVAN ) 80 MG tablet Take 1 tablet (80 mg total) by mouth daily.   amoxicillin  (AMOXIL ) 500 MG capsule Take 1 capsule (500 mg total) by mouth 2 (two) times daily. (Patient taking differently: Take 500 mg by mouth 2 (two) times daily. Only for dental work.)   No facility-administered encounter medications on file as of 02/07/2024.    Review of Systems  Review of Systems  Constitutional: Negative.   HENT: Negative.    Cardiovascular: Negative.   Gastrointestinal: Negative.   Allergic/Immunologic: Negative.   Neurological: Negative.   Psychiatric/Behavioral: Negative.       Objective:   BP 133/86   Pulse 82   Temp 97.9 F (36.6 C) (Oral)   Wt 203 lb (92.1 kg)    SpO2 99%   BMI 30.87 kg/m   Wt Readings from Last 5 Encounters:  02/07/24 203 lb (92.1 kg)  11/05/23 201 lb 3.2 oz (91.3 kg)  10/26/23 197 lb (89.4 kg)  07/31/23 201 lb 3.2 oz (91.3 kg)  07/27/23 201 lb 9.6 oz (91.4 kg)     Physical Exam Vitals and nursing note reviewed.  Constitutional:      General: He is not in acute distress.    Appearance: He is well-developed.  Cardiovascular:     Rate and Rhythm: Normal rate and regular rhythm.  Pulmonary:     Effort: Pulmonary effort is normal.     Breath sounds: Normal breath sounds.  Skin:    General: Skin is warm and dry.  Neurological:     Mental Status: He is alert and oriented to person, place, and time.       Assessment & Plan:   Type 2 diabetes mellitus with obesity (HCC) -     POCT glycosylated hemoglobin (Hb A1C)  Discoloration of skin of multiple sites of lower extremity -     Ambulatory referral to Vascular Surgery     Return in about 1 week (around 02/14/2024) for lab vist - 3 months for Rodney Friedman.   Bascom GORMAN Borer, NP 02/07/2024

## 2024-02-12 ENCOUNTER — Other Ambulatory Visit (INDEPENDENT_AMBULATORY_CARE_PROVIDER_SITE_OTHER): Payer: Self-pay

## 2024-02-12 DIAGNOSIS — L819 Disorder of pigmentation, unspecified: Secondary | ICD-10-CM

## 2024-02-12 DIAGNOSIS — I10 Essential (primary) hypertension: Secondary | ICD-10-CM

## 2024-02-12 MED ORDER — METOPROLOL SUCCINATE ER 50 MG PO TB24: 50.0000 mg | ORAL_TABLET | Freq: Two times a day (BID) | ORAL | 3 refills | Status: AC

## 2024-02-12 NOTE — Addendum Note (Signed)
 Addended by: BRINDA LORAIN SQUIBB on: 02/12/2024 12:09 PM   Modules accepted: Level of Service

## 2024-02-12 NOTE — Progress Notes (Signed)
 02/12/2024 Name: Rodney Friedman MRN: [id number] DOB: 10-Sep-1945  Chief Complaint  Patient presents with   Diabetes   Hypertension    Rodney Friedman is a 78 y.o. year old male who presented for a telephone visit.   They were referred to the pharmacist by their PCP for assistance in managing hypertension and medication access. 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 At this time, he reported home BP in the 140s/80s. He was seeing cardiology in a couple days, and Dr. Francyne started the patient on valsartan  80 mg daily as recommended. Scr at baseline was 1.24 mg/g and K was 4.8 mEq/L. No other medication changes were made. Patient was last seen by PCP, Bascom Borer, NP on 11/05/23. BP was 122/78 mmHg. A1C decreased from 6.6% to 6.2%. UACR was elevatd at 58 mg/g. At pharmacy follow-up, patient reported BP were much improved on valsartan . He was seen by PCP on 02/07/24. BP was 133/86 mmHg, HR 82. A1C was stable at 6.6%. Unfortunately, the lab was not open and he was not able to get his BMP drawn (scheduled for 02/13/24).  Today, patient reports doing well. He continues to tolerate valsartan  well, and states he will be coming to have his labs drawn tomorrow. He reports that he was referred to the incorrect vascular office and had to cancel his appts because they were in [place]. Requests assistance updating referral today.   Care Team: Primary Care Provider: Borer Bascom RAMAN, NP ; Next Scheduled Visit: 05/08/24 Cardiologist: Francyne Headland, MD ; Next Scheduled Visit: Needs to be scheduled ~ Dec 2025  Medication Access/Adherence  Current Pharmacy:  [id number] [address], [place] - [address] [place] [address] Phone: [phone] Fax: [phone]   Patient reports affordability concerns with  their medications: Yes  - Eliquis  is expensive, but he is able to budget Patient reports access/transportation concerns to their pharmacy: No  Patient reports adherence concerns with their medications:  No  - denies missed doses of his medications   Diabetes:  Current medications: metformin  IR 500 mg twice daily   Current glucose readings: N/A  - He does report that he has a glucometer (Accu Chek Guide) but he does not check his blood glucose. He often feels overwhelmed with trying to manage his health concerns such as seeing PT, prostate cancer management, behavioral health for depression and anxiety, etc.   Patient denies hypoglycemic s/sx including dizziness, shakiness, sweating. Patient reports hyperglycemic symptoms including nocturia (but has baseline prostate issues).  Current meal patterns:  - no changes in diet from last encounter, lots of macaroni, happily reports that he has cut out ice cream - Drinks coffee, unsweetened tea, bottled water, Premier protein, diet coke/Pepsi 2-3x per week, OJ (on occasion)  - He avoids sweetened beverages   Current physical activity: Physical Therapy once weekly, doing more physical activity in the yard as well,   Hypertension:  Current medications: metoprolol  XL 50 mg twice daily, valsartan  80 mg daily Previously tried: amlodipine , valsartan  - were both d/c'd in 2022 after pt had issues with hypotension following TAVR in 2022  Patient has a validated, automated, upper arm home BP cuff - reports recent ~135/82 mmHg, but reports he has been having some difficulty putting the cuff on, gets errors. Previously reported SBP 128, 125 mmHg, DBP 78 +/- 5  mmHg.   Patient denies hypotensive s/sx including dizziness, lightheadedness.  Patient denies hypertensive symptoms including CP, blurry vision, HA.  Patient reports shortness of breath while doing yard work, but this resolves with rest (h/o COPD)  Hyperlipidemia/ASCVD Risk Reduction  Current  lipid lowering medications: ezetimibe  10 mg daily, rosuvastatin  20 mg daily   Objective:  BP Readings from Last 3 Encounters:  02/07/24 133/86  01/02/24 (!) 158/103  11/05/23 122/78    Lab Results  Component Value Date   HGBA1C 6.6 (A) 02/07/2024   HGBA1C 6.2 (A) 11/05/2023   HGBA1C 6.6 (A) 07/27/2023    Lab Results  Component Value Date   CREATININE 1.24 10/26/2023   BUN 16 10/26/2023   NA 143 10/26/2023   K 4.8 10/26/2023   CL 107 (H) 10/26/2023   CO2 21 10/26/2023    Lab Results  Component Value Date   CHOL 95 (L) 10/26/2023   HDL 36 (L) 10/26/2023   LDLCALC 38 10/26/2023   TRIG 118 10/26/2023   CHOLHDL 2.6 10/26/2023    Medications Reviewed Today     Reviewed by Brinda Lorain SQUIBB, RPH (Pharmacist) on 02/12/24 at [phone]  Med List Status: <None>   Medication Order Taking? Sig Documenting Provider Last Dose Status Informant  acetaminophen  (TYLENOL ) 325 MG tablet [id number]  Take 1-2 tablets (325-650 mg total) by mouth every 4 (four) hours as needed for mild pain. Setzer, Nena PARAS, PA-C  Active   amoxicillin  (AMOXIL ) 500 MG capsule [id number]  Take 1 capsule (500 mg total) by mouth 2 (two) times daily.  Patient taking differently: Take 500 mg by mouth 2 (two) times daily. Only for dental work.   Oley Bascom RAMAN, NP  Active   apixaban  (ELIQUIS ) 5 MG TABS tablet [id number]  Take 1 tablet (5 mg total) by mouth 2 (two) times daily. Oley Bascom RAMAN, NP  Active   buPROPion  (WELLBUTRIN  SR) 200 MG 12 hr tablet [id number]  Take 1 tablet (200 mg total) by mouth every morning. Tasia Lung, MD  Active   buPROPion  ER (WELLBUTRIN  SR) 100 MG 12 hr tablet [id number]  1 qhs Plovsky, Gerald, MD  Active   calcium  carbonate (TUMS - DOSED IN MG ELEMENTAL CALCIUM ) 500 MG chewable tablet [id number]  Chew 1 tablet by mouth 2 (two) times daily. [provider]  Active            Med Note MAYER, [facility] R   Thu Aug 09, 2023 12:36 PM) Taking prn   cyanocobalamin  (VITAMIN B12) 500 MCG  tablet [id number]  Take 1 tablet (500 mcg total) by mouth every other day. Oley Bascom RAMAN, NP  Active   Denosumab (PROLIA [place]) [id number]  every 6 (six) months. [provider]  Active            Med Note DELSA LORAIN SQUIBB   Tue Oct 23, 2023 10:47 AM) Last dose Jun 29 2023  docusate sodium  (COLACE) 100 MG capsule [id number]  Take 1 capsule (100 mg total) by mouth 2 (two) times daily. Oley Bascom RAMAN, NP  Active            Med Note SOUNDRA, EBONY J   Fri Oct 26, 2023  8:04 AM) As needed  ezetimibe  (ZETIA ) 10 MG tablet [id number]  Take 1 tablet (10 mg total) by mouth daily. Oley Bascom RAMAN, NP  Active   gabapentin  (NEURONTIN ) 300 MG capsule [id number]  Take 1 capsule (300 mg total) by mouth daily. Tasia Lung, MD  Active  ketoconazole  (NIZORAL ) 2 % shampoo [id number]  Apply 1 Application topically 2 (two) times a week. [provider]  Active   latanoprost  (XALATAN ) 0.005 % ophthalmic solution [id number]  Place 1 drop into the left eye every other day. [provider]  Active Self           Med Note ROLENE EVERN PULLING A   Wed Jan 03, 2023  2:36 PM) Patient states that he needs a refill.  magnesium  oxide (MAG-OX) 400 (240 Mg) MG tablet [id number]  Take 400 mg by mouth daily. [provider]  Active   metFORMIN  (GLUCOPHAGE ) 500 MG tablet [id number]  Take 1 tablet (500 mg total) by mouth 2 (two) times daily with a meal. Oley Bascom RAMAN, NP  Active   metoprolol  succinate (TOPROL -XL) 50 MG 24 hr tablet [id number]  TAKE 1 TABLET (50 MG TOTAL) BY MOUTH TWICE A DAY .TAKE WITH OR IMMEDIATELY FOLLOWING A MEAL Oley Bascom RAMAN, NP  Active   rosuvastatin  (CRESTOR ) 20 MG tablet [id number]  Take 1 tablet (20 mg total) by mouth daily. Oley Bascom RAMAN, NP  Active   valsartan  (DIOVAN ) 80 MG tablet [id number]  Take 1 tablet (80 mg total) by mouth daily. Croitoru, Mihai, MD  Active             Assessment/Plan:   Diabetes: - Currently controlled with last A1C of 6.6% below  goal < 7%. With CAD history and microalbuminuria, patient is a good candidate for SGTLi, however he has cost barriers with brand name medications. Could consider pursuing PAP in the future. He is now appropriately treated with an ARB. Will continue to monitor kidney function for continuation/dose of metformin .  - UACR 11/05/23: 58 mg/g - Reviewed long term cardiovascular and renal outcomes of uncontrolled blood sugar - Reviewed goal A1c, goal fasting, and goal 2 hour post prandial glucose - Reviewed dietary modifications such as monitoring carbohydrate intake like pastas  - Recommend to continue metformin  IR 500 mg BID - Next A1C due Sept 2025   Hypertension: - Currently controlled below goal with home readings < 130/80 mmHg since starting low-dose valsartan , however recent clinic reading was slightly elevated. He is overdue for a repeat BMP after starting valsartan  but will obtain this tomorrow. He appears to be tolerating his current regimen well. - Reviewed long term cardiovascular and renal outcomes of uncontrolled blood pressure - Reviewed appropriate blood pressure monitoring technique and reviewed goal blood pressure. Recommended to check home blood pressure and heart rate once weekly on Thursday with the goal to do three times week as he is more compliant. Record on paper as patient does not have smart phone.  - Recommend to continue metoprolol  succinate 50 mg BID, valsartan  80 mg daily - Patient is due for repeat BMP after starting valsartan  - will f/u this week to review labs   Hyperlipidemia/ASCVD Risk Reduction: - Currently controlled with LDL-C below goal < 55 mg/dL given extensive ASCVD history. - Reviewed long term complications of uncontrolled cholesterol - Recommend to continue rosuvastatin  20 mg daily, ezetimibe  10 mg daily   Provided patient with [facility] phone number to outreach to schedule appt if he has not heard from them in 1-2 weeks.    Follow Up  Plan: PCP 05/08/24, PharmD telephone 04/15/24   Lorain Baseman, PharmD [facility] [phone]

## 2024-02-13 ENCOUNTER — Other Ambulatory Visit: Payer: Self-pay

## 2024-02-13 DIAGNOSIS — I10 Essential (primary) hypertension: Secondary | ICD-10-CM

## 2024-02-14 ENCOUNTER — Ambulatory Visit: Payer: Self-pay | Admitting: Nurse Practitioner

## 2024-02-14 ENCOUNTER — Other Ambulatory Visit: Payer: Self-pay

## 2024-02-14 LAB — BASIC METABOLIC PANEL WITH GFR
BUN/Creatinine Ratio: 11 (ref 10–24)
BUN: 13 mg/dL (ref 8–27)
CO2: 22 mmol/L (ref 20–29)
Calcium: 9.6 mg/dL (ref 8.6–10.2)
Chloride: 106 mmol/L (ref 96–106)
Creatinine, Ser: 1.23 mg/dL (ref 0.76–1.27)
Glucose: 132 mg/dL — ABNORMAL HIGH (ref 70–99)
Potassium: 4.9 mmol/L (ref 3.5–5.2)
Sodium: 143 mmol/L (ref 134–144)
eGFR: 61 mL/min/1.73 (ref >59)

## 2024-02-14 NOTE — Patient Outreach (Signed)
 Complex Care Management   Visit Note  02/14/2024  Name:  Rodney Friedman MRN: [id number] DOB: 02-04-1946  Situation: Referral received for Complex Care Management related to HTN I obtained verbal consent from Patient.  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 Verlin & Dr. 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 He reports initally he was having bleeding due to blood thinner but that has since resolved    Cardiovascular Cardiovascular Symptoms Reported: No symptoms reported Does patient have uncontrolled Hypertension?: No Is patient checking Blood Pressure at home?: Yes Patient's Recent BP reading at home: 128/82 Cardiovascular Management Strategies: Medication therapy, Routine screening Cardiovascular Comment: Patient notes that PCP placed a referral for vascular to assess positive ABIs and discoloration BLE. He reports their office called them yesterday to schedule an appointment. He did not wish to proceed with appointment immediately and reports they will be reaching back out in about 30 days.  Respiratory Respiratory Symptoms Reported: Shortness of breath Other Respiratory Symptoms: some shortness of breath at baseline per patient. Patient feels that Valsartan  gives him an extra 15-20 minutes of endurance before he needs to stop and rest Respiratory Management Strategies: Medication therapy, Routine screening  Endocrine Endocrine Symptoms Reported: No symptoms reported Is patient diabetic?: Yes Is patient checking blood sugars at home?: No    Gastrointestinal Gastrointestinal Symptoms Reported: No symptoms reported Additional Gastrointestinal Details: Patient reports his appetite is normal. He reports a lack of desire to cook frequently due to only cooking or himself, but he does  eat regular meals and drinks Premier Protein. Patient reports regular BMs and takes a stool softener Gastrointestinal Management Strategies: Medication therapy    Genitourinary Genitourinary Symptoms Reported: Incontinence Additional Genitourinary Details: Patient reports some incontinence due to history of radiation treatment for Prostate cancer. He reports he  has to change breif/pad about twice a day Genitourinary Management Strategies: Incontinence garment/pad  Integumentary Integumentary Symptoms Reported: Other Other Integumentary Symptoms: Discoloration BLE Skin Management Strategies: Routine screening  Musculoskeletal Musculoskelatal Symptoms Reviewed: Difficulty walking, Unsteady gait, Joint pain, Back pain Additional Musculoskeletal Details: Patient reports he wears a brace for his back. He reports rare Tylenol  use for pain relief, which he finds effective Musculoskeletal Management Strategies: Medical device, Medication therapy, Routine screening (Cane as needed) Musculoskeletal Comment: Patient notes PT has signed off. He continues to do exercises at home. Patient notes a few falls over the last year due to tripping over something while doing yardwork. Falls in the past year?: Yes Number of falls in past year: 2 or more Was there an injury with Fall?: No Fall Risk Category Calculator: 2 Patient Fall Risk Level: Moderate Fall Risk Patient at Risk for Falls Due to: Impaired balance/gait Fall risk Follow up: Falls evaluation completed, Education provided, Falls prevention discussed  Psychosocial Psychosocial Symptoms Reported: Other Other Psychosocial Conditions: Patient reports meeting with counselor at least twice a month and pyschiatrist every 3 months. Patient reports feeling that his mental health has remained stable Behavioral Management Strategies: Counseling, Medication therapy        02/14/2024    PHQ2-9 Depression Screening   Little interest or pleasure in doing things    Feeling down, depressed, or hopeless    PHQ-2 - Total Score    Trouble falling or staying asleep, or sleeping too much    Feeling tired or having little energy    Poor appetite or overeating     Feeling bad about yourself - or that you are a failure or have let yourself or your family down    Trouble concentrating on things, such as reading the newspaper  or watching television    Moving or speaking so slowly that other people could have noticed.  Or the opposite - being so fidgety or restless that you have been moving around a lot more than usual    Thoughts that you would be better off dead, or hurting yourself in some way    PHQ2-9 Total Score    If you checked off any problems, how difficult have these problems made it for you to do your work, take care of things at home, or get along with other people    Depression Interventions/Treatment      There were no vitals filed for this visit.  Medications Reviewed Today     Reviewed by [name] Rosaline SQUIBB, RN (Registered Nurse) on 02/14/24 at [phone]  Med List Status: <None>   Medication Order Taking? Sig Documenting Provider Last Dose Status Informant  acetaminophen  (TYLENOL ) 325 MG tablet [id number]  Take 1-2 tablets (325-650 mg total) by mouth every 4 (four) hours as needed for mild pain. Setzer, Nena PARAS, PA-C  Active   amoxicillin  (AMOXIL ) 500 MG capsule [id number]  Take 1 capsule (500 mg total) by mouth 2 (two) times daily.  Patient taking differently: Take 500 mg by mouth 2 (two) times daily. Only for dental work.   Oley Bascom RAMAN, NP  Active   apixaban  (ELIQUIS ) 5 MG TABS tablet [id number]  Take 1 tablet (5 mg total) by  mouth 2 (two) times daily. Oley Bascom RAMAN, NP  Active   buPROPion  (WELLBUTRIN  SR) 200 MG 12 hr tablet [id number]  Take 1 tablet (200 mg total) by mouth every morning. Tasia Lung, MD  Active   buPROPion  ER (WELLBUTRIN  SR) 100 MG 12 hr tablet [id number]  1 qhs Plovsky, Gerald, MD  Active   calcium  carbonate (TUMS - DOSED IN MG ELEMENTAL CALCIUM ) 500 MG chewable tablet [id number]  Chew 1 tablet by mouth 2 (two) times daily. [provider]  Active            Med Note MAYER, [facility] R   Thu Aug 09, 2023 12:36 PM) Taking prn   cyanocobalamin  (VITAMIN B12) 500 MCG tablet [id number]  Take 1 tablet (500 mcg total) by mouth every other day. Oley Bascom RAMAN, NP  Active    Denosumab (PROLIA [place]) [id number]  every 6 (six) months. [provider]  Active            Med Note DELSA LORAIN SQUIBB   Tue Oct 23, 2023 10:47 AM) Last dose Jun 29 2023  docusate sodium  (COLACE) 100 MG capsule [id number]  Take 1 capsule (100 mg total) by mouth 2 (two) times daily. Oley Bascom RAMAN, NP  Active            Med Note SOUNDRA, EBONY J   Fri Oct 26, 2023  8:04 AM) As needed  ezetimibe  (ZETIA ) 10 MG tablet [id number]  Take 1 tablet (10 mg total) by mouth daily. Oley Bascom RAMAN, NP  Active   gabapentin  (NEURONTIN ) 300 MG capsule [id number]  Take 1 capsule (300 mg total) by mouth daily. Tasia Lung, MD  Active   ketoconazole  (NIZORAL ) 2 % shampoo [id number]  Apply 1 Application topically 2 (two) times a week. [provider]  Active   latanoprost  (XALATAN ) 0.005 % ophthalmic solution [id number]  Place 1 drop into the left eye every other day. [provider]  Active Self           Med Note ROLENE EVERN PULLING A   Wed Jan 03, 2023  2:36 PM) Patient states that he needs a refill.  magnesium  oxide (MAG-OX) 400 (240 Mg) MG tablet [id number]  Take 400 mg by mouth daily. [provider]  Active   metFORMIN  (GLUCOPHAGE ) 500 MG tablet [id number]  Take 1 tablet (500 mg total) by mouth 2 (two) times daily with a meal. Oley Bascom RAMAN, NP  Active   metoprolol  succinate (TOPROL -XL) 50 MG 24 hr tablet [id number]  Take 1 tablet (50 mg total) by mouth in the morning and at bedtime. Take with or immediately following a meal. Oley Bascom RAMAN, NP  Active   rosuvastatin  (CRESTOR ) 20 MG tablet [id number]  Take 1 tablet (20 mg total) by mouth daily. Oley Bascom RAMAN, NP  Active   valsartan  (DIOVAN ) 80 MG tablet [id number]  Take 1 tablet (80 mg total) by mouth daily. Croitoru, Jerel, MD  Active             Recommendation:   Continue Current Plan of Care  Follow Up Plan:   Telephone follow up appointment date/time:  04/24/24 at 9 AM per patient request  Rosaline Finlay, RN MSN [place]  [facility] RN Care Manager Direct Dial: [phone]  Fax: [phone]

## 2024-02-14 NOTE — Patient Instructions (Signed)
 Visit Information  Thank you for taking time to visit with me today. Please don't hesitate to contact me if I can be of assistance to you before our next scheduled appointment.  Your next care management appointment is by telephone on 04/24/24 at 9 AM  Please call the care guide team at [phone] if you need to cancel, schedule, or reschedule an appointment.   Please call the Suicide and Crisis Lifeline: 988 call 1-800-273-TALK (toll free, 24 hour hotline) if you are experiencing a Mental Health or Behavioral Health Crisis or need someone to talk to.  Rosaline Finlay, RN MSN [place]  [facility] RN Care Manager Direct Dial: [phone]  Fax: [phone]

## 2024-02-26 ENCOUNTER — Ambulatory Visit (INDEPENDENT_AMBULATORY_CARE_PROVIDER_SITE_OTHER): Admitting: Licensed Clinical Social Worker

## 2024-02-26 ENCOUNTER — Encounter (HOSPITAL_COMMUNITY): Payer: Self-pay | Admitting: Licensed Clinical Social Worker

## 2024-02-26 DIAGNOSIS — F339 Major depressive disorder, recurrent, unspecified: Secondary | ICD-10-CM

## 2024-02-26 NOTE — Progress Notes (Signed)
   THERAPIST PROGRESS NOTE  Session Time: 8:10-9:00am  Participation Level: Active  Behavioral Response: Well GroomedAlertDepressed  Type of Therapy: Individual Therapy  Treatment Goals addressed:  Problem: OP Depression  Dates: Start:  01/22/24    Disciplines: Interdisciplinary, PROVIDER  Goal: LTG: Increase coping skills to manage depression and improve ability to perform daily activities  Dates: Start:  01/22/24   Expected End:  07/21/24    Disciplines: Interdisciplinary, PROVIDER  Intervention: Work with [name] to identify the major components of a recent episode of depression: physical symptoms, major thoughts and images, and major behaviors they experienced  Dates: Start:  01/22/24      ProgressTowards Goals: Progressing  Interventions: Motivational Interviewing  Summary: Joanthan Hlavacek is a 78 y.o. male who presents with MDD, chronic.   Suicidal/Homicidal: Nowithout intent/plan  Therapist Response: Jonmarc engaged well in individual in person session. 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 Clinician provided time and space for Bennet to share his frustrations, and also identified that solutions were found eventually in each challenge. Clinician reflected the importance of paying attention to when his inner teacher comes out, which is very rigid and demanding with expectations of perfection. Clinician noted that this was his expectation, but that the people he is communicating with do not always have or are capable of that goal.  Gaurav shared ongoing support from a care giver, who assists with documents, driving to appointments, and helping in the house.   Plan: Return again in 2-3 weeks.  Diagnosis: Major depression, recurrent, chronic  Collaboration of Care: Psychiatrist AEB updated Dr. Tasia  Patient/Guardian  was advised Release of Information must be obtained prior to any record release in order to collaborate their care with an outside provider. Patient/Guardian was advised if they have not already done so to contact the registration department to sign all necessary forms in order for us  to release information regarding their care.   Consent: Patient/Guardian gives verbal consent for treatment and assignment of benefits for services provided during this visit. Patient/Guardian expressed understanding and agreed to proceed.   [name] [place], LCSW 02/26/2024

## 2024-03-11 ENCOUNTER — Ambulatory Visit (INDEPENDENT_AMBULATORY_CARE_PROVIDER_SITE_OTHER): Admitting: Licensed Clinical Social Worker

## 2024-03-11 DIAGNOSIS — F339 Major depressive disorder, recurrent, unspecified: Secondary | ICD-10-CM

## 2024-03-13 NOTE — Telephone Encounter (Signed)
 Form from [facility] is being faxed over to confirm vaccine and dates. KH

## 2024-03-19 ENCOUNTER — Other Ambulatory Visit (HOSPITAL_COMMUNITY): Payer: Self-pay | Admitting: Urology

## 2024-03-19 DIAGNOSIS — R9721 Rising PSA following treatment for malignant neoplasm of prostate: Secondary | ICD-10-CM

## 2024-03-20 ENCOUNTER — Encounter (HOSPITAL_COMMUNITY): Payer: Self-pay | Admitting: Licensed Clinical Social Worker

## 2024-03-20 NOTE — Progress Notes (Signed)
   THERAPIST PROGRESS NOTE  Session Time: 9:00am-9:55am  Participation Level: Active  Behavioral Response: Well GroomedAlertDepressed  Type of Therapy: Individual Therapy  Treatment Goals addressed: Problem: OP Depression  Dates: Start:  01/22/24    Disciplines: Interdisciplinary, PROVIDER  Goal: LTG: Increase coping skills to manage depression and improve ability to perform daily activities  Dates: Start:  01/22/24   Expected End:  07/21/24    Disciplines: Interdisciplinary, PROVIDER  Intervention: Work with [name] to identify the major components of a recent episode of depression: physical symptoms, major thoughts and images, and major behaviors they experienced  Dates: Start:  01/22/24      ProgressTowards Goals: Not Progressing  Interventions: CBT  Summary: Rodney Friedman is a 78 y.o. male who presents with MDD, chronic.   Suicidal/Homicidal: Nowithout intent/plan  Therapist Response: [name] engaged well in individual in person session with clinician. Clinician utilized CBT to process thoughts, feelings, and interactions. [name] shared the list of woes and challenges over the past few weeks. Clinician identified continuing struggles with technology. Clinician explored options for seeking a class online or in person to learn how to use the online platforms and his smart phone. [name] shared that he feels demoralized due to his lack of savvy in technology. Clinician reflected that [name] continues to struggle with the same issue, wekk after week and encouraged [name] to find social opportunities to get him out of the house.   Plan: Return again in 2 weeks.  Diagnosis: Major depression, recurrent, chronic  Collaboration of Care: Patient refused AEB none required  Patient/Guardian was advised Release of Information must be obtained prior to any record release in order to collaborate their care with an outside provider. Patient/Guardian was advised if they have not already done so  to contact the registration department to sign all necessary forms in order for us  to release information regarding their care.   Consent: Patient/Guardian gives verbal consent for treatment and assignment of benefits for services provided during this visit. Patient/Guardian expressed understanding and agreed to proceed.   [name] [place], LCSW 03/20/2024

## 2024-03-27 ENCOUNTER — Other Ambulatory Visit: Payer: Self-pay | Admitting: Nurse Practitioner

## 2024-03-27 DIAGNOSIS — E119 Type 2 diabetes mellitus without complications: Secondary | ICD-10-CM

## 2024-03-31 ENCOUNTER — Ambulatory Visit (HOSPITAL_COMMUNITY)
Admission: RE | Admit: 2024-03-31 | Discharge: 2024-03-31 | Disposition: A | Discharge status: Home / Self Care | Source: Ambulatory Visit | Attending: Urology | Admitting: Urology

## 2024-03-31 DIAGNOSIS — R9721 Rising PSA following treatment for malignant neoplasm of prostate: Secondary | ICD-10-CM | POA: Insufficient documentation

## 2024-03-31 MED ORDER — FLOTUFOLASTAT F 18 GALLIUM 296-5846 MBQ/ML IV SOLN
8.3200 | Freq: Once | INTRAVENOUS | Status: AC
  Administered 2024-03-31: 8.32 via INTRAVENOUS

## 2024-04-01 ENCOUNTER — Ambulatory Visit (HOSPITAL_COMMUNITY): Admitting: Licensed Clinical Social Worker

## 2024-04-08 ENCOUNTER — Encounter (HOSPITAL_COMMUNITY): Payer: Self-pay | Admitting: Psychiatry

## 2024-04-08 ENCOUNTER — Ambulatory Visit (HOSPITAL_COMMUNITY): Admitting: Psychiatry

## 2024-04-08 ENCOUNTER — Other Ambulatory Visit: Payer: Self-pay

## 2024-04-08 VITALS — BP 145/84 | HR 56 | Ht 68.0 in | Wt 205.2 lb

## 2024-04-08 DIAGNOSIS — F325 Major depressive disorder, single episode, in full remission: Secondary | ICD-10-CM | POA: Diagnosis not present

## 2024-04-08 MED ORDER — GABAPENTIN 300 MG PO CAPS: 300.0000 mg | ORAL_CAPSULE | Freq: Every day | ORAL | 2 refills | Status: AC

## 2024-04-08 MED ORDER — BUPROPION HCL ER (SR) 200 MG PO TB12
200.0000 mg | ORAL_TABLET | Freq: Every morning | ORAL | 2 refills | Status: AC
Start: 2024-04-08 — End: ?

## 2024-04-08 MED ORDER — BUPROPION HCL ER (SR) 100 MG PO TB12: ORAL_TABLET | ORAL | 4 refills | Status: AC

## 2024-04-08 NOTE — Progress Notes (Signed)
 Psychiatric Initial Adult Assessment   Patient Identification: Rodney Friedman MRN:  [id number] Date of Evaluation:  04/08/2024 Referral Source: Dr. Landy Chief Complaint: Need follow-up care    Visit Diagnosis: Bipolar disorder, substance use disorder Bipolar disorder  History of Present Illness:   Today the patient is doing fairly well.  He has a lot of dental procedures that have occurred and are certain to happen.  It is very expensive for him.  His diagnosis medically is osteoporosis.  He takes an injection for treatment.  He has a significant cardiovascular history including a valve replacement years ago.  The patient has not smoked cigarettes in years and has been sober from alcohol  for 23 years.  His mood is good.  He denies daily depression.  He is sleeping and eating well and has reasonably good energy.  He has a daughter and [place]  assigned in this community.  The patient drinks no alcohol  uses no drugs and overall he feels pretty good other than his dental problems.  He exercises a bit and he believes it is helpful.   Substance Abuse History in the last 12 months:  Yes.    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 Verlin & Dr. 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    Past Surgical History:  Procedure Laterality Date   Arm Surgery Right    from door accident with glass   CARDIAC CATHETERIZATION     CARDIAC VALVE REPLACEMENT     CATARACT EXTRACTION W/ INTRAOCULAR LENS IMPLANT Bilateral    COLONOSCOPY     CORONARY ARTERY BYPASS GRAFT  08/04/2004   EYE SURGERY     POLYPECTOMY     RIGHT/LEFT HEART CATH AND CORONARY/GRAFT ANGIOGRAPHY N/A 01/20/2021   Procedure: RIGHT/LEFT HEART CATH AND CORONARY/GRAFT ANGIOGRAPHY;  Surgeon: Verlin Lonni BIRCH, MD;  Location: [facility] INVASIVE CV LAB;  Service: Cardiovascular;  Laterality: N/A;   TEE WITHOUT CARDIOVERSION N/A 02/08/2021   Procedure: TRANSESOPHAGEAL ECHOCARDIOGRAM (TEE);  Surgeon: Verlin Lonni BIRCH, MD;  Location: [facility] INVASIVE CV  LAB;  Service: Open Heart Surgery;  Laterality: N/A;   TRANSCATHETER AORTIC VALVE REPLACEMENT, CAROTID Left 02/08/2021   Procedure: TRANSCATHETER AORTIC VALVE REPLACEMENT, LEFT CAROTID;  Surgeon: Verlin Lonni BIRCH, MD;  Location: [facility] INVASIVE CV LAB;  Service: Open Heart Surgery;  Laterality: Left;    Family Psychiatric History:   Family History:  Family History  Problem Relation Age of Onset   Colon cancer Mother    Ovarian cancer Mother    Uterine cancer Mother    Cancer Mother    Hypertension Father    Prostate cancer Father    Cancer Father    Drug abuse Daughter    Colon polyps Neg Hx     Social History:   Social History   Socioeconomic History   Marital status: Divorced     Spouse name: Not on file   Number of children: 2   Years of education: 16   Highest education level: Master's degree (e.g., MA, MS, MEng, MEd, MSW, MBA)  Occupational History   Occupation: Retired Runner, Broadcasting/film/video  Tobacco Use   Smoking status: Former    Current packs/day: 0.00    Average packs/day: 0.8 packs/day for 59.0 years (44.3 ttl pk-yrs)    Types: Cigarettes    Start date: 12/16/1961    Quit date: 12/16/2020    Years since quitting: 3.3    Passive exposure: Past   Smokeless tobacco: Never  Vaping Use   Vaping status: Never Used  Substance and Sexual Activity   Alcohol  use: No    Comment: none since march 3,2002   Drug use: No   Sexual activity: Not Currently  Other Topics Concern   Not on file  Social History Narrative   Lives alone   Divorced   Right-handed   Education: College   No caffeine   Social Drivers of Health   Financial Resource Strain: Medium Risk (02/06/2024)   Overall Financial Resource Strain (CARDIA)    Difficulty of Paying Living Expenses: Somewhat hard  Food Insecurity: No Food Insecurity (02/06/2024)   Hunger Vital Sign    Worried About Running Out of Food in the Last Year: Never true    Ran Out of Food in the Last Year: Never true  Transportation Needs: Unmet Transportation Needs (02/06/2024)   PRAPARE - Administrator, Civil Service (Medical): Yes    Lack of Transportation (Non-Medical): No  Physical Activity: Sufficiently Active (02/06/2024)   Exercise Vital Sign    Days of Exercise per Week: 5 days    Minutes of Exercise per Session: 30 min  Stress: Stress Concern Present (02/06/2024)   [employer] of Occupational Health - Occupational Stress Questionnaire    Feeling of Stress: Very much  Social Connections: Moderately Integrated (02/06/2024)   Social Connection and Isolation Panel    Frequency of Communication with Friends and Family: Twice a week    Frequency of Social Gatherings with Friends and Family: More than three times a  week    Attends Religious Services: More than 4 times per year    Active Member of [employer] or Organizations: Yes    Attends Engineer, Structural: More than 4 times per year    Marital Status: Divorced    Additional Social History:   Allergies:   Allergies  Allergen Reactions   Other Rash    Allergen: Plants and bushes while doing yard work   [employer] Nausea And Vomiting   Poison Ivy Extract Rash    Metabolic Disorder Labs: Lab  Results  Component Value Date   HGBA1C 6.6 (A) 02/07/2024   MPG 148.46 01/03/2023   MPG 128 12/17/2020   No results found for: PROLACTIN Lab Results  Component Value Date   CHOL 95 (L) 10/26/2023   TRIG 118 10/26/2023   HDL 36 (L) 10/26/2023   CHOLHDL 2.6 10/26/2023   VLDL 28 01/03/2023   LDLCALC 38 10/26/2023   LDLCALC 28 01/03/2023     Current Medications: Current Outpatient Medications  Medication Sig Dispense Refill   acetaminophen  (TYLENOL ) 325 MG tablet Take 1-2 tablets (325-650 mg total) by mouth every 4 (four) hours as needed for mild pain.     amoxicillin  (AMOXIL ) 500 MG capsule Take 1 capsule (500 mg total) by mouth 2 (two) times daily. (Patient taking differently: Take 500 mg by mouth 2 (two) times daily. Only for dental work.) 24 capsule 0   apixaban  (ELIQUIS ) 5 MG TABS tablet Take 1 tablet (5 mg total) by mouth 2 (two) times daily. 180 tablet 3   calcium  carbonate (TUMS - DOSED IN MG ELEMENTAL CALCIUM ) 500 MG chewable tablet Chew 1 tablet by mouth 2 (two) times daily.     cyanocobalamin  (VITAMIN B12) 500 MCG tablet Take 1 tablet (500 mcg total) by mouth every other day. 60 tablet 2   Denosumab (PROLIA Saxon) every 6 (six) months.     docusate sodium  (COLACE) 100 MG capsule Take 1 capsule (100 mg total) by mouth 2 (two) times daily.     ezetimibe  (ZETIA ) 10 MG tablet Take 1 tablet (10 mg total) by mouth daily. 90 tablet 3   ketoconazole  (NIZORAL ) 2 % shampoo Apply 1 Application topically 2 (two) times a week.     latanoprost   (XALATAN ) 0.005 % ophthalmic solution Place 1 drop into the left eye every other day.     magnesium  oxide (MAG-OX) 400 (240 Mg) MG tablet Take 400 mg by mouth daily.     metFORMIN  (GLUCOPHAGE ) 500 MG tablet TAKE 1 TABLET BY MOUTH 2 TIMES DAILY WITH A MEAL. 180 tablet 1   metoprolol  succinate (TOPROL -XL) 50 MG 24 hr tablet Take 1 tablet (50 mg total) by mouth in the morning and at bedtime. Take with or immediately following a meal. 180 tablet 3   rosuvastatin  (CRESTOR ) 20 MG tablet Take 1 tablet (20 mg total) by mouth daily. 90 tablet 3   valsartan  (DIOVAN ) 80 MG tablet Take 1 tablet (80 mg total) by mouth daily. 90 tablet 3   buPROPion  (WELLBUTRIN  SR) 200 MG 12 hr tablet Take 1 tablet (200 mg total) by mouth every morning. 90 tablet 2   buPROPion  ER (WELLBUTRIN  SR) 100 MG 12 hr tablet 1 qhs 90 tablet 4   gabapentin  (NEURONTIN ) 300 MG capsule Take 1 capsule (300 mg total) by mouth daily. 90 capsule 2   No current facility-administered medications for this visit.    Neurologic: Headache: No Seizure: No Paresthesias:No  Musculoskeletal: Strength & Muscle Tone: within normal limits Gait & Station: normal Patient leans: Right  Psychiatric Specialty Exam: ROS  Blood pressure (!) 145/84, pulse (!) 56, height 5' 8 (1.727 m), weight 205 lb 3.2 oz (93.1 kg).Body mass index is 31.2 kg/m.  General Appearance: Casual  Eye Contact:  Good  Speech:  Clear and Coherent  Volume:  Normal  Mood:  NA  Affect:  Appropriate  Thought Process:  Goal Directed  Orientation:  NA  Thought Content:  WDL  Suicidal Thoughts:  No  Homicidal Thoughts:  No  Memory:  Negative  Judgement:  Good  Insight:  Good  Psychomotor Activity:  Normal  Concentration:    Recall:  Fair  Fund of Knowledge:Good  Language: Good  Akathisia:  No  Handed:  Right  AIMS (if indicated):    Assets:  Desire for Improvement  ADL's:  Intact  Cognition: WNL  Sleep:      Treatment Plan Summary: 11/18/20251:30 PM    This  patient'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  He continues taking Wellbutrin  100 mg slow release in the morning 200 mg slow release later in the day.  He also takes gabapentin  mainly for anxiety.  Patient continues in one-to-one therapy in our office.  He also goes to NA and AA throughout the month.  Patient is actually very stable.  He will return to see me in 4 months.

## 2024-04-15 ENCOUNTER — Other Ambulatory Visit: Payer: Self-pay

## 2024-04-15 ENCOUNTER — Ambulatory Visit (INDEPENDENT_AMBULATORY_CARE_PROVIDER_SITE_OTHER): Admitting: Licensed Clinical Social Worker

## 2024-04-15 ENCOUNTER — Encounter (HOSPITAL_COMMUNITY): Payer: Self-pay | Admitting: Licensed Clinical Social Worker

## 2024-04-15 DIAGNOSIS — F411 Generalized anxiety disorder: Secondary | ICD-10-CM | POA: Diagnosis not present

## 2024-04-15 DIAGNOSIS — F339 Major depressive disorder, recurrent, unspecified: Secondary | ICD-10-CM

## 2024-04-15 NOTE — Progress Notes (Signed)
   THERAPIST PROGRESS NOTE  Session Time: 8:00am-8:55am  Participation Level: Active  Behavioral Response: NeatAlertDepressed and Irritable  Type of Therapy: Individual Therapy  Treatment Goals addressed:  Problem: OP Depression  Dates: Start:  01/22/24    Disciplines: Interdisciplinary, PROVIDER  Goal: LTG: Increase coping skills to manage depression and improve ability to perform daily activities  Dates: Start:  01/22/24   Expected End:  07/21/24    Disciplines: Interdisciplinary, PROVIDER  Intervention: Work with [name] to identify the major components of a recent episode of depression: physical symptoms, major thoughts and images, and major behaviors they experienced  Dates: Start:  01/22/24      ProgressTowards Goals: Progressing  Interventions: CBT  Summary: Rodney Friedman is a 78 y.o. male who presents with MDD, recurrent, chronic, and GAD.   Suicidal/Homicidal: Nowithout intent/plan  Therapist Response: Rodney Friedman engaged well in individual in person session with clinician. Clinician utilized CBT to process thoughts, feelings, and interactions. Rodney Friedman shared the usual litany of challenges due to his inability to utilized technology. Rodney Friedman shared daily routine of spending hours reviewing his accounts, MyCharts, emails, etc. Clinician explored options of doing this a little later in the morning so as not to start the day with so much frustration. Clinician discussed the importance of gratitude as a way to manage anxiety. Clinician identified the high impact of anxiety on Rodney Friedman's daily life, noting worry about his finances, his documents, his insurance, his health, etc. Clinician encouraged time communicating gratitude in order to relax and feel more settled before taking on challenges as a way to fortify himself.    Plan: Return again in 2 weeks.  Diagnosis: Major depression, recurrent, chronic  GAD (generalized anxiety disorder)  Collaboration of Care: Psychiatrist AEB  reviewed last note from Dr. Tasia  Patient/Guardian was advised Release of Information must be obtained prior to any record release in order to collaborate their care with an outside provider. Patient/Guardian was advised if they have not already done so to contact the registration department to sign all necessary forms in order for us  to release information regarding their care.   Consent: Patient/Guardian gives verbal consent for treatment and assignment of benefits for services provided during this visit. Patient/Guardian expressed understanding and agreed to proceed.   [name] Reiffton, LCSW 04/15/2024

## 2024-04-24 ENCOUNTER — Other Ambulatory Visit: Payer: Self-pay

## 2024-04-24 NOTE — Patient Instructions (Signed)
 Visit Information  Thank you for taking time to visit with me today. Please don't hesitate to contact me if I can be of assistance to you before our next scheduled appointment.  Your next care management appointment is by telephone on 06/26/24 at 9 AM  Please call the care guide team at [phone] if you need to cancel, schedule, or reschedule an appointment.   Please call the Suicide and Crisis Lifeline: 988 call 1-800-273-TALK (toll free, 24 hour hotline) if you are experiencing a Mental Health or Behavioral Health Crisis or need someone to talk to.  Rosaline Finlay, RN MSN [place]  [facility]BC[facility] Population Health RN Care Manager Direct Dial: [phone]  Fax: [phone]  Following is a copy of your care plan:   Goals Addressed             This Visit's Progress    VBCI RN Care Plan   On track    Problems:  Chronic Disease Management support and education needs related to CAD and history of prostate CA  Goal: Over the next 30 days the Patient will attend all scheduled medical appointments: PCP 05/08/24 as evidenced by completed visit notes uploaded to EMR        demonstrate Ongoing health management independence as evidenced by patient report of taking all medications as ordered, monitoring BP at home, and maintaining an active lifestyle         Interventions:   CAD Interventions: Assessed understanding of CAD diagnosis Medications reviewed including medications utilized in CAD treatment plan Provided education on importance of blood pressure control in management of CAD Counseled on the importance of exercise goals with target of 150 minutes per week Reviewed Importance of taking all medications as prescribed Educated patient on goal BP of <130/80 Discussed vascular referral. Patient prefers to hold off on scheduling appointment at this time   Evaluation of current treatment plan related to chronic conditions self-management and patient's adherence to plan as  established by provider. Discussed plans with patient for ongoing care management follow up and provided patient with direct contact information for care management team Evaluation of current treatment plan related to history of prostate CA and patient's adherence to plan as established by provider Screening for signs and symptoms of depression related to chronic disease state   Patient Self-Care Activities:  Attend all scheduled provider appointments Call pharmacy for medication refills 3-7 days in advance of running out of medications Call provider office for new concerns or questions  Perform all self care activities independently  Perform IADL's (shopping, preparing meals, housekeeping, managing finances) independently Take medications as prescribed    Plan:  Telephone follow up appointment with care management team member scheduled for:  06/26/24 at 9 AM per patient request

## 2024-04-24 NOTE — Patient Outreach (Signed)
 Complex Care Management   Visit Note  04/24/2024  Name:  Rodney Friedman MRN: [id number] DOB: 05-31-1945  Situation: Referral received for Complex Care Management related to CAD I obtained verbal consent from Patient.  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 Verlin & Dr. 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    Assessment: Patient Reported Symptoms:  Cognitive Cognitive Status: Able to follow simple commands, Alert and oriented to person, place, and time, Normal speech and language skills Cognitive/Intellectual Conditions Management [RPT]: None reported or documented in medical history or problem list   Health Maintenance Behaviors: Annual physical exam, Stress management Health Facilitated by: Rest,  Stress management  Neurological Neurological Review of Symptoms: No symptoms reported Neurological Management Strategies: Medication therapy, Routine screening  HEENT HEENT Symptoms Reported: Not assessed      Cardiovascular Cardiovascular Symptoms Reported: Other: Other Cardiovascular Symptoms: Chronic BLE discoloration Does patient have uncontrolled Hypertension?: No Is patient checking Blood Pressure at home?: Yes Patient's Recent BP reading at home: 128/80 something Cardiovascular Management Strategies: Medication therapy, Routine screening Cardiovascular Comment: Patient reports he spoke with his podiatrist regarding vascular referral for positive ABIs and discoloration BLE. He reports at this time he would prefer to focus on his other chronic conditions and declines scheduling with vascular provider  Respiratory Respiratory Symptoms Reported: Shortness of breath Additional Respiratory Details: Patient reports continued shortness of breath after about 15-20 minutes of endurance, such as clipping the hedges. He feels that breathing is at baseline Respiratory Management Strategies: Routine screening, Adequate rest  Endocrine Endocrine Symptoms Reported: No symptoms reported Is patient diabetic?: Yes Is patient checking blood sugars at home?: No    Gastrointestinal Gastrointestinal Symptoms Reported: Not assessed      Genitourinary Genitourinary Symptoms Reported: Incontinence Genitourinary Management Strategies: Incontinence garment/pad, Coping strategies Genitourinary Comment: Patient reports he had PET scan completed about a month ago. He reports they didn't find any type of cancer or abnormalities in my body  Integumentary Integumentary Symptoms Reported: Other Other Integumentary Symptoms: Discoloration BLE  Skin Management Strategies: Routine screening  Musculoskeletal Musculoskelatal Symptoms Reviewed: Back pain, Difficulty walking, Joint pain, Unsteady gait Additional  Musculoskeletal Details: Patient reports he continues to wear knee and back brace. He reports exercises at home and Extra Strength Tylenol  help to provide pain relief Musculoskeletal Management Strategies: Medical device, Medication therapy, Routine screening (Cane as needed) Musculoskeletal Comment: Patient reports DEXA scan completed at [facility] about 6 weeks ago. He reports the results showed a slight improvement in bone health so he will be continuing Prolia with urologist Dr. Cam. Patient denies falls since previous [facility] visit Falls in the past year?: Yes Number of falls in past year: 2 or more Was there an injury with Fall?: No Fall Risk Category Calculator: 2 Patient Fall Risk Level: Moderate Fall Risk Patient at Risk for Falls Due to: Impaired balance/gait Fall risk Follow up: Falls evaluation completed, Education provided, Falls prevention discussed  Psychosocial Psychosocial Symptoms Reported: Other, Anxiety - if selected complete GAD, Depression - if selected complete PHQ 2-9 Other Psychosocial Conditions: Paitent reports he continues to meet with his counselor twice a month. He reports feelings of anxiety and depression are always there but vary in intensity Behavioral Management Strategies: Abstinence from substances, Counseling, Medication therapy Major Change/Loss/Stressor/Fears (CP): Medical condition, self Techniques to Cope with Loss/Stress/Change: Diversional activities, Medication, Counseling      04/24/2024    PHQ2-9 Depression Screening   Little interest or pleasure in doing things Not at all  Feeling down, depressed, or hopeless Several days  PHQ-2 - Total Score 1  Trouble falling or staying asleep, or sleeping too much    Feeling tired or having little energy    Poor appetite or overeating     Feeling bad about yourself - or that you are a failure or have let yourself or your family down    Trouble concentrating on things, such as reading the newspaper or  watching television    Moving or speaking so slowly that other people could have noticed.  Or the opposite - being so fidgety or restless that you have been moving around a lot more than usual    Thoughts that you would be better off dead, or hurting yourself in some way    PHQ2-9 Total Score    If you checked off any problems, how difficult have these problems made it for you to do your work, take care of things at home, or get along with other people    Depression Interventions/Treatment Medication, Counseling, Currently on Treatment    There were no vitals filed for this visit. Pain Scale: 0-10 Pain Score: 5  Pain Type: Chronic pain Pain Location: Back  Medications Reviewed Today     Reviewed by [name] Rosaline SQUIBB, RN (Registered Nurse) on 04/24/24 at 0915  Med List Status: <None>   Medication Order Taking? Sig Documenting Provider Last Dose Status Informant  acetaminophen  (TYLENOL ) 325 MG tablet [id number]  Take 1-2 tablets (325-650 mg total) by mouth every 4 (four) hours as needed for mild pain. Setzer, Nena PARAS, PA-C  Active   amoxicillin  (AMOXIL ) 500 MG capsule [id number]  Take 1 capsule (500 mg total) by mouth 2 (two) times daily.  Patient taking differently: Take 500 mg by mouth 2 (two) times daily. Only for dental work.   Oley Bascom RAMAN, NP  Active   apixaban  (ELIQUIS ) 5 MG TABS tablet [id number]  Take 1 tablet (5 mg total) by mouth 2 (two) times daily. Oley Bascom RAMAN, NP  Active  buPROPion  (WELLBUTRIN  SR) 200 MG 12 hr tablet [id number]  Take 1 tablet (200 mg total) by mouth every morning. Tasia Lung, MD  Active   buPROPion  ER (WELLBUTRIN  SR) 100 MG 12 hr tablet [id number]  1 qhs Plovsky, Gerald, MD  Active   calcium  carbonate (TUMS - DOSED IN MG ELEMENTAL CALCIUM ) 500 MG chewable tablet [id number]  Chew 1 tablet by mouth 2 (two) times daily. [provider]  Active            Med Note MAYER, [facility] R   Thu Aug 09, 2023 12:36 PM) Taking prn   cyanocobalamin   (VITAMIN B12) 500 MCG tablet [id number]  Take 1 tablet (500 mcg total) by mouth every other day. Oley Bascom RAMAN, NP  Active   Denosumab (PROLIA [place]) [id number]  every 6 (six) months. [provider]  Active            Med Note DELSA LORAIN SQUIBB   Tue Oct 23, 2023 10:47 AM) Last dose Jun 29 2023  docusate sodium  (COLACE) 100 MG capsule [id number]  Take 1 capsule (100 mg total) by mouth 2 (two) times daily. Oley Bascom RAMAN, NP  Active            Med Note SOUNDRA, EBONY J   Fri Oct 26, 2023  8:04 AM) As needed  ezetimibe  (ZETIA ) 10 MG tablet [id number]  Take 1 tablet (10 mg total) by mouth daily. Oley Bascom RAMAN, NP  Active   gabapentin  (NEURONTIN ) 300 MG capsule [id number]  Take 1 capsule (300 mg total) by mouth daily. Tasia Lung, MD  Active   ketoconazole  (NIZORAL ) 2 % shampoo [id number]  Apply 1 Application topically 2 (two) times a week. [provider]  Active   latanoprost  (XALATAN ) 0.005 % ophthalmic solution [id number]  Place 1 drop into the left eye every other day. [provider]  Active Self           Med Note ROLENE EVERN PULLING A   Wed Jan 03, 2023  2:36 PM) Patient states that he needs a refill.  magnesium  oxide (MAG-OX) 400 (240 Mg) MG tablet [id number]  Take 400 mg by mouth daily. [provider]  Active   metFORMIN  (GLUCOPHAGE ) 500 MG tablet [id number]  TAKE 1 TABLET BY MOUTH 2 TIMES DAILY WITH A MEAL. Oley Bascom RAMAN, NP  Active   metoprolol  succinate (TOPROL -XL) 50 MG 24 hr tablet [id number]  Take 1 tablet (50 mg total) by mouth in the morning and at bedtime. Take with or immediately following a meal. Oley Bascom RAMAN, NP  Active   rosuvastatin  (CRESTOR ) 20 MG tablet [id number]  Take 1 tablet (20 mg total) by mouth daily. Oley Bascom RAMAN, NP  Active   valsartan  (DIOVAN ) 80 MG tablet [id number]  Take 1 tablet (80 mg total) by mouth daily. Croitoru, Jerel, MD  Active             Recommendation:   Continue Current Plan of Care  Follow Up  Plan:   Telephone follow up appointment date/time:  06/26/24 at 9 AM per patient request  Rosaline Finlay, RN MSN [place]  [facility] RN Care Manager Direct Dial: [phone]  Fax: [phone]

## 2024-04-29 ENCOUNTER — Ambulatory Visit (HOSPITAL_COMMUNITY): Admitting: Licensed Clinical Social Worker

## 2024-05-05 ENCOUNTER — Other Ambulatory Visit: Payer: Self-pay

## 2024-05-05 NOTE — Telephone Encounter (Signed)
Error. KH 

## 2024-05-06 ENCOUNTER — Other Ambulatory Visit: Payer: Self-pay | Admitting: Cardiovascular Disease

## 2024-05-06 DIAGNOSIS — I482 Chronic atrial fibrillation, unspecified: Secondary | ICD-10-CM

## 2024-05-06 NOTE — Telephone Encounter (Signed)
 Prescription refill request for Eliquis  received. Indication:afib Last office visit:6/25 Scr: 1.23  9/25 Age:78 Weight:93.1  kg  Prescription refilled

## 2024-05-08 ENCOUNTER — Ambulatory Visit: Payer: Self-pay | Admitting: Nurse Practitioner

## 2024-05-08 ENCOUNTER — Encounter: Payer: Self-pay | Admitting: Nurse Practitioner

## 2024-05-08 VITALS — BP 126/85 | HR 40 | Wt 206.4 lb

## 2024-05-08 DIAGNOSIS — M2550 Pain in unspecified joint: Secondary | ICD-10-CM

## 2024-05-08 DIAGNOSIS — E119 Type 2 diabetes mellitus without complications: Secondary | ICD-10-CM | POA: Diagnosis not present

## 2024-05-08 DIAGNOSIS — L819 Disorder of pigmentation, unspecified: Secondary | ICD-10-CM | POA: Diagnosis not present

## 2024-05-08 DIAGNOSIS — D72829 Elevated white blood cell count, unspecified: Secondary | ICD-10-CM

## 2024-05-08 LAB — POCT GLYCOSYLATED HEMOGLOBIN (HGB A1C)
HbA1c POC (<> result, manual entry): 6.8 % (ref 4.0–5.6)
HbA1c, POC (controlled diabetic range): 6.8 % (ref 0.0–7.0)
HbA1c, POC (prediabetic range): 6.8 % — AB (ref 5.7–6.4)
Hemoglobin A1C: 6.8 % — AB (ref 4.0–5.6)

## 2024-05-08 MED ORDER — GLUCOSAMINE CHOND COMPLEX/MSM PO TABS: 1.0000 | ORAL_TABLET | Freq: Every day | ORAL | 2 refills | Status: AC

## 2024-05-08 NOTE — Progress Notes (Signed)
 Subjective   Patient ID: Rodney Friedman, male    DOB: March 10, 1946, 78 y.o.   MRN: [id number]  Chief Complaint  Patient presents with   Follow-up    Would like to know wbc for leukemia    Diabetes   Medication Management    Would like to know if there is medication for joint health that you can recommend    Knee Pain    Would like to know if a nurse is possible if he decides to get knee surgery     Referring provider: Oley Bascom RAMAN, NP  Rodney Friedman is a 78 y.o. male with Past Medical History: No date: Adenomatous colon polyp No date: Alcoholism (HCC) No date: Allergy No date: Anxiety No date: Arthritis No date: Atrial fibrillation (HCC) No date: CAD (coronary artery disease) No date: Cataract     Comment:  removed left eye  12/16/2020: CHF (congestive heart failure) (HCC) 07/08/2013: CLL (chronic lymphocytic leukemia) (HCC) No date: Clotting disorder No date: Degenerative arthritis of lumbar spine No date: Degenerative disc disease, lumbar No date: Depression No date: Diabetes mellitus without complication (HCC) No date: Diverticulosis No date: Dyspnea No date: Dysrhythmia No date: Eye abnormality     Comment:  Macular scarring R eye No date: Glaucoma 2002: Heart murmur     Comment:  diagnosed about 20 years ago. Pt says it causes               abnormal EKGs No date: HLD (hyperlipidemia) No date: Hypertension No date: Leukemia [facility])     Comment:  CLL 2006: Myocardial infarction (HCC) No date: Osteopenia No date: Osteoporosis 07/08/2013: Prostate cancer (HCC)     Comment:  [facility]- getting Lupron  shot every 6               months - traces in prostate and 2 lymphnodes left hip-               non focused traces per pt  02/08/2021: S/P TAVR (transcatheter aortic valve replacement)     Comment:  s/p TAVR with a 23mm Edwards S3U via the TF approach by               Dr. Verlin & Dr. Lucas No date: Sleep apnea No date:  Stroke [facility]) No date: Substance abuse (HCC) 09/22/2015: Tremor, essential   HPI  Patient presents today for routine follow-up visit. Overall he has been stable since his last visit here.  Patient is following with cardiology and is taking eliquis . Has been restarted on valsartan . He is following with urology to monitor elevated PSA level. He has followed with oncology. Patient does have a history of smoking but no longer smokes. Quit smoking December 10, 2020. Follows with pulmonary. A1C in office today is 6.8. Has started prolia. Denies f/c/s, n/v/d, hemoptysis, PND, leg swelling. Denies chest pain or edema.    Allergies[1]  Immunization History  Administered Date(s) Administered   Fluad Quad(high Dose 65+) 01/22/2019, 01/25/2023   INFLUENZA, HIGH DOSE SEASONAL PF 02/15/2018, 01/22/2020, 01/23/2022   Influenza Split 02/17/2015   Influenza, Quadrivalent, Recombinant, Inj, Pf 01/22/2019   Influenza,inj,Quad PF,6+ Mos 02/11/2016, 02/08/2017   Influenza-Unspecified 02/17/2015, 01/22/2019, 01/23/2022   Moderna Sars-Covid-2 Vaccination 08/20/2019, 09/17/2019, 03/25/2020   Pneumococcal Conjugate-13 02/24/2014   Pneumococcal Polysaccharide-23 09/08/2015   Tdap 09/08/2015   Unspecified SARS-COV-2 Vaccination 08/20/2019   Zoster Recombinant(Shingrix) 02/23/2022, 04/26/2022   Zoster, Live 05/22/2013    Tobacco History: Tobacco Use History[2]  Counseling given: Not Answered   Outpatient Encounter Medications as of 05/08/2024  Medication Sig   acetaminophen  (TYLENOL ) 325 MG tablet Take 1-2 tablets (325-650 mg total) by mouth every 4 (four) hours as needed for mild pain.   amoxicillin  (AMOXIL ) 500 MG capsule Take 1 capsule (500 mg total) by mouth 2 (two) times daily. (Patient taking differently: Take 500 mg by mouth 2 (two) times daily. Only for dental work.)   buPROPion  (WELLBUTRIN  SR) 200 MG 12 hr tablet Take 1 tablet (200 mg total) by mouth every morning.   buPROPion  ER (WELLBUTRIN  SR) 100 MG  12 hr tablet 1 qhs   calcium  carbonate (TUMS - DOSED IN MG ELEMENTAL CALCIUM ) 500 MG chewable tablet Chew 1 tablet by mouth 2 (two) times daily.   cyanocobalamin  (VITAMIN B12) 500 MCG tablet Take 1 tablet (500 mcg total) by mouth every other day.   Denosumab (PROLIA [place]) every 6 (six) months.   docusate sodium  (COLACE) 100 MG capsule Take 1 capsule (100 mg total) by mouth 2 (two) times daily.   ELIQUIS  5 MG TABS tablet TAKE 1 TABLET BY MOUTH TWICE A DAY   ezetimibe  (ZETIA ) 10 MG tablet Take 1 tablet (10 mg total) by mouth daily.   gabapentin  (NEURONTIN ) 300 MG capsule Take 1 capsule (300 mg total) by mouth daily.   ketoconazole  (NIZORAL ) 2 % shampoo Apply 1 Application topically 2 (two) times a week.   latanoprost  (XALATAN ) 0.005 % ophthalmic solution Place 1 drop into the left eye every other day.   magnesium  oxide (MAG-OX) 400 (240 Mg) MG tablet Take 400 mg by mouth daily.   metFORMIN  (GLUCOPHAGE ) 500 MG tablet TAKE 1 TABLET BY MOUTH 2 TIMES DAILY WITH A MEAL.   metoprolol  succinate (TOPROL -XL) 50 MG 24 hr tablet Take 1 tablet (50 mg total) by mouth in the morning and at bedtime. Take with or immediately following a meal.   Misc Natural Products (GLUCOSAMINE CHOND COMPLEX/MSM) TABS Take 1 tablet by mouth daily with breakfast.   rosuvastatin  (CRESTOR ) 20 MG tablet Take 1 tablet (20 mg total) by mouth daily.   valsartan  (DIOVAN ) 80 MG tablet Take 1 tablet (80 mg total) by mouth daily.   No facility-administered encounter medications on file as of 05/08/2024.    Review of Systems  Review of Systems  Constitutional: Negative.   HENT: Negative.    Cardiovascular: Negative.   Gastrointestinal: Negative.   Allergic/Immunologic: Negative.   Neurological: Negative.   Psychiatric/Behavioral: Negative.       Objective:   BP 126/85 (BP Location: Left Arm, Patient Position: Sitting, Cuff Size: Large)   Pulse (!) 40   Wt 206 lb 6.4 oz (93.6 kg)   SpO2 99%   BMI 31.38 kg/m   Wt Readings  from Last 5 Encounters:  05/08/24 206 lb 6.4 oz (93.6 kg)  02/07/24 203 lb (92.1 kg)  11/05/23 201 lb 3.2 oz (91.3 kg)  10/26/23 197 lb (89.4 kg)  07/31/23 201 lb 3.2 oz (91.3 kg)     Physical Exam Vitals and nursing note reviewed.  Constitutional:      General: He is not in acute distress.    Appearance: He is well-developed.  Cardiovascular:     Rate and Rhythm: Normal rate and regular rhythm.  Pulmonary:     Effort: Pulmonary effort is normal.     Breath sounds: Normal breath sounds.  Skin:    General: Skin is warm and dry.  Neurological:     Mental Status: He is  alert and oriented to person, place, and time.       Assessment & Plan:   Type 2 diabetes mellitus without complication, without long-term current use of insulin  (HCC) -     POCT glycosylated hemoglobin (Hb A1C) -     CBC -     Comprehensive metabolic panel with GFR  Arthralgia, unspecified joint -     Glucosamine Chond Complex/MSM; Take 1 tablet by mouth daily with breakfast.  Dispense: 90 tablet; Refill: 2  Leukocytosis, unspecified type -     Ambulatory referral to Hematology / Oncology  Discoloration of skin of lower leg -     Ambulatory referral to Vascular Surgery     Return in about 3 months (around 08/06/2024).   Bascom GORMAN Borer, NP 05/08/2024      [1]  Allergies Allergen Reactions   Other Rash    Allergen: Plants and bushes while doing yard work   [employer] Nausea And Vomiting   Poison Ivy Extract Rash  [2]  Social History Tobacco Use  Smoking Status Former   Current packs/day: 0.00   Average packs/day: 0.8 packs/day for 59.0 years (44.3 ttl pk-yrs)   Types: Cigarettes   Start date: 12/16/1961   Quit date: 12/16/2020   Years since quitting: 3.3   Passive exposure: Past  Smokeless Tobacco Never

## 2024-05-08 NOTE — Progress Notes (Signed)
 Rodney Friedman

## 2024-05-09 ENCOUNTER — Ambulatory Visit: Payer: Self-pay | Admitting: Nurse Practitioner

## 2024-05-09 LAB — CBC
Hematocrit: 46.1 % (ref 37.5–51.0)
Hemoglobin: 15 g/dL (ref 13.0–17.7)
MCH: 31.4 pg (ref 26.6–33.0)
MCHC: 32.5 g/dL (ref 31.5–35.7)
MCV: 96 fL (ref 79–97)
Platelets: 194 x10E3/uL (ref 150–450)
RBC: 4.78 x10E6/uL (ref 4.14–5.80)
RDW: 13.2 % (ref 11.6–15.4)
WBC: 11.7 x10E3/uL — ABNORMAL HIGH (ref 3.4–10.8)

## 2024-05-09 LAB — COMPREHENSIVE METABOLIC PANEL WITH GFR
ALT: 24 IU/L (ref 0–44)
AST: 25 IU/L (ref 0–40)
Albumin: 4.3 g/dL (ref 3.8–4.8)
Alkaline Phosphatase: 32 IU/L — ABNORMAL LOW (ref 47–123)
BUN/Creatinine Ratio: 11 (ref 10–24)
BUN: 12 mg/dL (ref 8–27)
Bilirubin Total: 0.4 mg/dL (ref 0.0–1.2)
CO2: 22 mmol/L (ref 20–29)
Calcium: 9.6 mg/dL (ref 8.6–10.2)
Chloride: 105 mmol/L (ref 96–106)
Creatinine, Ser: 1.1 mg/dL (ref 0.76–1.27)
Globulin, Total: 2 g/dL (ref 1.5–4.5)
Glucose: 107 mg/dL — ABNORMAL HIGH (ref 70–99)
Potassium: 4.6 mmol/L (ref 3.5–5.2)
Sodium: 141 mmol/L (ref 134–144)
Total Protein: 6.3 g/dL (ref 6.0–8.5)
eGFR: 70 mL/min/1.73

## 2024-05-20 ENCOUNTER — Ambulatory Visit (INDEPENDENT_AMBULATORY_CARE_PROVIDER_SITE_OTHER): Admitting: Licensed Clinical Social Worker

## 2024-05-20 ENCOUNTER — Other Ambulatory Visit: Payer: Self-pay

## 2024-05-20 ENCOUNTER — Encounter (HOSPITAL_COMMUNITY): Payer: Self-pay | Admitting: Licensed Clinical Social Worker

## 2024-05-20 DIAGNOSIS — F339 Major depressive disorder, recurrent, unspecified: Secondary | ICD-10-CM | POA: Diagnosis not present

## 2024-05-20 DIAGNOSIS — I10 Essential (primary) hypertension: Secondary | ICD-10-CM

## 2024-05-20 NOTE — Progress Notes (Signed)
 "  05/20/2024 Name: Rodney Friedman MRN: [id number] DOB: 02-20-1946  Chief Complaint  Patient presents with   Congestive Heart Failure   Hypertension    Rodney Friedman is a 78 y.o. year old male who presented for a telephone visit.   They were referred to the pharmacist by their PCP for assistance in managing hypertension and medication access. 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 At this time, he reported home BP in the 140s/80s. He was seeing cardiology in a couple days, and Dr. Francyne started the patient on valsartan  80 mg daily as recommended. Scr at baseline was 1.24 mg/g and K was 4.8 mEq/L. No other medication changes were made. Patient was last seen by PCP, Rodney Borer, NP on 11/05/23. BP was 122/78 mmHg. A1C decreased from 6.6% to 6.2%. UACR was elevatd at 58 mg/g. At pharmacy follow-up, patient reported BP were much improved on valsartan . He was seen by PCP on 02/07/24. BP was 133/86 mmHg, HR 82. A1C was stable at 6.6%. Unfortunately, the lab was not open and he was not able to get his BMP drawn (scheduled for 02/13/24). A PCP visit on 05/08/24, BP was 126/85 mmHg, HR 40. BMP stable.   Today, patient reports doing well. He is concerned about increased costs of premiums and medications.    Care Team: Primary Care Provider: Borer Rodney RAMAN, NP ; Next Scheduled Visit: 08/22/24 Cardiologist: Francyne Headland, MD ; Next Scheduled Visit: Needs to be scheduled ~ Dec 2025  Medication Access/Adherence  Current Pharmacy:  [id number], [place] - [address] [place] [address] Phone: [phone] Fax: [phone]   Patient reports affordability concerns with their medications: Yes  - Eliquis  is expensive, but he is able to budget. Will attempt to enroll in [employer] cardiomyopathy  grant today. Patient reports access/transportation concerns to their pharmacy: No  Patient reports adherence concerns with their medications:  No  - denies missed doses of his medications   Diabetes:  Current medications: metformin  IR 500 mg twice daily   Current glucose readings: N/A  - He does report that he has a glucometer (Accu Chek Guide) but he does not check his blood glucose. He often feels overwhelmed with trying to manage his health concerns such as seeing PT, prostate cancer management, behavioral health for depression and anxiety, etc.   Patient denies hypoglycemic s/sx including dizziness, shakiness, sweating. Patient reports hyperglycemic symptoms including nocturia (but has baseline prostate issues).  Current meal patterns:  Did not discuss today  Current physical activity: Physical Therapy once weekly, doing more physical activity in the yard as well,   Hypertension:  Current medications: metoprolol  XL 50 mg twice daily, valsartan  80 mg daily Previously tried: amlodipine , valsartan  - were both d/c'd in 2022 after pt had issues with hypotension following TAVR in 2022  Patient has a validated, automated, upper arm home BP cuff - reports recent ly SBP 120-130 mmHg, DBP ~80-85 mmHg. Patient confirmed that pulse at home ranges 50-70 bpm (has not seen number as low as 40).   Patient denies hypotensive s/sx including dizziness, lightheadedness.  Patient denies hypertensive symptoms including CP, blurry vision, HA.  Patient reports shortness of breath while doing yard work, but this resolves with rest (h/o COPD)  Hyperlipidemia/ASCVD Risk Reduction  Current lipid lowering medications: ezetimibe  10 mg daily, rosuvastatin   20 mg daily  Atrial Fibrillation:  Current medications: Rate Control: metoprolol  XL 50 mg twice daily Rhythm Control: none Anticoagulation Regimen: Eliquis  5 mg BID  Current medication access support: Eligible for [employer] Grant (cardiomyopathy -  HFpEF), will enroll today  Objective:  BP Readings from Last 3 Encounters:  05/08/24 126/85  04/08/24 (!) 145/84  02/07/24 133/86    Lab Results  Component Value Date   HGBA1C 6.8 (A) 05/08/2024   HGBA1C 6.8 05/08/2024   HGBA1C 6.8 (A) 05/08/2024   HGBA1C 6.8 05/08/2024    Lab Results  Component Value Date   CREATININE 1.10 05/08/2024   BUN 12 05/08/2024   NA 141 05/08/2024   K 4.6 05/08/2024   CL 105 05/08/2024   CO2 22 05/08/2024    Lab Results  Component Value Date   CHOL 95 (L) 10/26/2023   HDL 36 (L) 10/26/2023   LDLCALC 38 10/26/2023   TRIG 118 10/26/2023   CHOLHDL 2.6 10/26/2023    Medications Reviewed Today     Reviewed by Brinda Lorain SQUIBB, RPH-CPP (Pharmacist) on 05/20/24 at 1432  Med List Status: <None>   Medication Order Taking? Sig Documenting Provider Last Dose Status Informant  acetaminophen  (TYLENOL ) 325 MG tablet [id number]  Take 1-2 tablets (325-650 mg total) by mouth every 4 (four) hours as needed for mild pain. Setzer, Nena PARAS, PA-C  Active   amoxicillin  (AMOXIL ) 500 MG capsule [id number]  Take 1 capsule (500 mg total) by mouth 2 (two) times daily.  Patient taking differently: Take 500 mg by mouth 2 (two) times daily. Only for dental work.   Oley Rodney RAMAN, NP  Active   buPROPion  (WELLBUTRIN  SR) 200 MG 12 hr tablet [id number]  Take 1 tablet (200 mg total) by mouth every morning. Tasia Lung, MD  Active   buPROPion  ER (WELLBUTRIN  SR) 100 MG 12 hr tablet [id number]  1 qhs Plovsky, Gerald, MD  Active   calcium  carbonate (TUMS - DOSED IN MG ELEMENTAL CALCIUM ) 500 MG chewable tablet [id number]  Chew 1 tablet by mouth 2 (two) times daily. [provider]  Active            Med Note MAYER, [facility] R   Thu Aug 09, 2023 12:36 PM) Taking prn   cyanocobalamin  (VITAMIN B12) 500 MCG tablet [id number]  Take 1 tablet (500 mcg total) by mouth every other day. Oley Rodney RAMAN, NP  Active   Denosumab (PROLIA Hardeman) [id number]  every 6 (six) months. [provider]  Active            Med Note DELSA LORAIN SQUIBB   Tue Oct 23, 2023 10:47 AM) Last dose Jun 29 2023  docusate sodium  (COLACE) 100 MG capsule [id number]  Take 1 capsule (100 mg total) by mouth 2 (two) times daily. Oley Rodney RAMAN, NP  Active            Med Note SOUNDRA, EBONY J   Fri Oct 26, 2023  8:04 AM) As needed  ELIQUIS  5 MG TABS tablet [id number]  TAKE 1 TABLET BY MOUTH TWICE A DAY Croitoru, Mihai, MD  Active   ezetimibe  (ZETIA ) 10 MG tablet [id number]  Take 1 tablet (10 mg total) by mouth daily. Oley Rodney RAMAN, NP  Active   gabapentin  (NEURONTIN ) 300 MG capsule [id number]  Take 1 capsule (300 mg total) by mouth daily. Tasia Lung, MD  Active   ketoconazole  (NIZORAL ) 2 % shampoo [id number]  Apply 1 Application topically 2 (two) times a week. [provider]  Active   latanoprost  (XALATAN ) 0.005 % ophthalmic solution [id number]  Place 1 drop into the left eye every other day. [provider]  Active Self           Med Note ROLENE EVERN PULLING A   Wed Jan 03, 2023  2:36 PM) Patient states that he needs a refill.  magnesium  oxide (MAG-OX) 400 (240 Mg) MG tablet [id number]  Take 400 mg by mouth daily. [provider]  Active   metFORMIN  (GLUCOPHAGE ) 500 MG tablet [id number]  TAKE 1 TABLET BY MOUTH 2 TIMES DAILY WITH A MEAL. Oley Rodney RAMAN, NP  Active   metoprolol  succinate (TOPROL -XL) 50 MG 24 hr tablet [id number]  Take 1 tablet (50 mg total) by mouth in the morning and at bedtime. Take with or immediately following a meal. Oley Rodney RAMAN, NP  Active   Misc Natural Products (GLUCOSAMINE CHOND COMPLEX/MSM) TABS [id number]  Take 1 tablet by mouth daily with breakfast. Oley Rodney RAMAN, NP  Active   rosuvastatin  (CRESTOR ) 20 MG tablet [id number]  Take 1 tablet (20 mg total) by mouth daily. Oley Rodney RAMAN, NP  Active   valsartan  (DIOVAN ) 80 MG tablet [id number]  Take 1 tablet (80 mg total) by mouth daily. Croitoru, Mihai, MD  Active              Assessment/Plan:   Diabetes: - Currently controlled with last A1C of 6.8% below goal < 7%. With CAD history and microalbuminuria, patient is a good candidate for SGTLi, however he has cost barriers with brand name medications. Hesitant to add more medication at this time, but could potentially obtain via [employer] Grant in the future. He is now appropriately treated with an ARB. Will continue to monitor kidney function for continuation/dose of metformin .  - UACR 11/05/23: 58 mg/g - Reviewed long term cardiovascular and renal outcomes of uncontrolled blood sugar - Reviewed goal A1c, goal fasting, and goal 2 hour post prandial glucose - Reviewed dietary modifications such as monitoring carbohydrate intake like pastas  - Recommend to continue metformin  IR 500 mg BID - Next A1C due Sept 2025   Hypertension: - Currently controlled below goal with clinic and home readings < 130/80 mmHg or slightly above 130/80 mmHg since starting low-dose valsartan . BMP stable.  - Reviewed long term cardiovascular and renal outcomes of uncontrolled blood pressure - Reviewed appropriate blood pressure monitoring technique and reviewed goal blood pressure. Recommended to check home blood pressure and heart rate once weekly on Thursday with the goal to do three times week as he is more compliant. Record on paper as patient does not have smart phone.  - Recommend to continue metoprolol  succinate 50 mg BID, valsartan  80 mg daily   Hyperlipidemia/ASCVD Risk Reduction: - Currently controlled with LDL-C below goal < 55 mg/dL given extensive ASCVD history. - Reviewed long term complications of uncontrolled cholesterol - Recommend to continue rosuvastatin  20 mg daily, ezetimibe  10 mg daily  Atrial Fibrillation: - Currently controlled - Enrolled patient in [employer] Cardimyopathy grant today. Will use for Eliquis  when next fill is due ~Feb-March 2026).  - Submitted diagnosis verification. Awaiting approval. Once  approved will provide card information to [facility] [address]) BIN: [id number] PCN: [id number] GRP: [id number] ID: [id number]      Provided patient with [facility] at [facility] phone number to outreach to schedule appt if he has not heard from them in 1-2 weeks.    Follow Up Plan: PCP 08/22/24, PharmD telephone 07/01/24  Lorain Baseman, PharmD [facility] [phone]   "

## 2024-05-20 NOTE — Progress Notes (Signed)
" ° °  THERAPIST PROGRESS NOTE  Session Time: 8:15am-9:00am  Participation Level: Active  Behavioral Response: Well GroomedAlertAnxious, Depressed, and Irritable  Type of Therapy: Individual Therapy  Treatment Goals addressed:  Problem: OP Depression  Dates: Start:  01/22/24    Disciplines: Interdisciplinary, PROVIDER  Goal: LTG: Increase coping skills to manage depression and improve ability to perform daily activities  Dates: Start:  01/22/24   Expected End:  07/21/24    Disciplines: Interdisciplinary, PROVIDER  Intervention: Work with [name] to identify the major components of a recent episode of depression: physical symptoms, major thoughts and images, and major behaviors they experienced  Dates: Start:  01/22/24      ProgressTowards Goals: Progressing  Interventions: Supportive and Reframing  Summary: Rodney Friedman is a 78 y.o. male who presents with MDD, recurrent, chronic.   Suicidal/Homicidal: Nowithout intent/plan  Therapist Response: Rodney Friedman engaged well in individual in person session with clinician. Clinician utilized supportive counseling and reframing to address current concerns. Rodney Friedman shared updates about financial concerns, worries about his health and the future. Clinician reflected these concerns and identified strengths with supports in his family and the community. Rodney Friedman shared that he is now 41 months cigarette free, with a stop date of 12/12/2020.  Rodney Friedman shared ability to self regulate and process stress. However, he shared increased concern about short term memory loss, which becomes enraging. Clinician reframed this sense of rage as the possibility to find humor in the situation.   Plan: Return again in 2 weeks.  Diagnosis: Major depression, recurrent, chronic  Collaboration of Care: Patient refused AEB none required in this session.   Patient/Guardian was advised Release of Information must be obtained prior to any record release in order to  collaborate their care with an outside provider. Patient/Guardian was advised if they have not already done so to contact the registration department to sign all necessary forms in order for us  to release information regarding their care.   Consent: Patient/Guardian gives verbal consent for treatment and assignment of benefits for services provided during this visit. Patient/Guardian expressed understanding and agreed to proceed.   [name] [place]erton, LCSW 05/20/2024  "

## 2024-06-03 ENCOUNTER — Ambulatory Visit (INDEPENDENT_AMBULATORY_CARE_PROVIDER_SITE_OTHER): Admitting: Licensed Clinical Social Worker

## 2024-06-03 ENCOUNTER — Encounter (HOSPITAL_COMMUNITY): Payer: Self-pay | Admitting: Licensed Clinical Social Worker

## 2024-06-03 DIAGNOSIS — F339 Major depressive disorder, recurrent, unspecified: Secondary | ICD-10-CM | POA: Diagnosis not present

## 2024-06-03 NOTE — Progress Notes (Signed)
" ° °  THERAPIST PROGRESS NOTE  Session Time: 9:00am-9:55am  Participation Level: Active  Behavioral Response: Well GroomedAlertDysphoric  Type of Therapy: Individual Therapy  Treatment Goals addressed:  Active     OP Depression     LTG: Increase coping skills to manage depression and improve ability to perform daily activities (Progressing)     Start:  01/22/24    Expected End:  07/21/24         Work with [name] to identify the major components of a recent episode of depression: physical symptoms, major thoughts and images, and major behaviors they experienced     Start:  01/22/24            ProgressTowards Goals: Progressing  Interventions: Motivational Interviewing  Summary: Felis Quillin is a 79 y.o. male who presents with MDD, chronic.   Suicidal/Homicidal: Nowithout intent/plan  Therapist Response: Eugene engaged well in individual in person session with clinician. Clinician utilized MI OARS to reflect and summarize thoughts, feelings, and interactions. Clinician validated concerns about finances and making the best decisions possible related to insurance coverages and medical needs. Clinician explored updates on health and noted that health has been stabilized with current treatment regimen. Clinician discussed plans and goals for the year. Clinician also validated the need to focus on one day at a time.   Plan: Return again in 2 weeks.  Diagnosis: Major depression, recurrent, chronic  Collaboration of Care: Patient refused AEB none required  Patient/Guardian was advised Release of Information must be obtained prior to any record release in order to collaborate their care with an outside provider. Patient/Guardian was advised if they have not already done so to contact the registration department to sign all necessary forms in order for us  to release information regarding their care.   Consent: Patient/Guardian gives verbal consent for treatment and assignment  of benefits for services provided during this visit. Patient/Guardian expressed understanding and agreed to proceed.   Harlene JONELLE Rosser, LCSW 06/03/2024  "

## 2024-06-05 ENCOUNTER — Other Ambulatory Visit: Payer: Self-pay

## 2024-06-11 ENCOUNTER — Telehealth: Payer: Self-pay | Admitting: Pharmacy Technician

## 2024-06-11 NOTE — Progress Notes (Signed)
" ° °  06/11/2024 Name: Rodney Friedman MRN: [id number] DOB: December 04, 1945  Patient is appearing for a follow-up visit with the population health pharmacy technician. Last engaged with the clinical pharmacist to discuss heart failure on 05/20/2024. Contacted pharmacy today to discuss medication access.   Plan from last clinical pharmacist appointment:  Diabetes: - Currently controlled with last A1C of 6.8% below goal < 7%. With CAD history and microalbuminuria, patient is a good candidate for SGTLi, however he has cost barriers with brand name medications. Hesitant to add more medication at this time, but could potentially obtain via [employer] in the future. He is now appropriately treated with an ARB. Will continue to monitor kidney function for continuation/dose of metformin .  - UACR 11/05/23: 58 mg/g - Reviewed long term cardiovascular and renal outcomes of uncontrolled blood sugar - Reviewed goal A1c, goal fasting, and goal 2 hour post prandial glucose - Reviewed dietary modifications such as monitoring carbohydrate intake like pastas  - Recommend to continue metformin  IR 500 mg BID - Next A1C due Sept 2025 Hypertension: - Currently controlled below goal with clinic and home readings < 130/80 mmHg or slightly above 130/80 mmHg since starting low-dose valsartan . BMP stable.  - Reviewed long term cardiovascular and renal outcomes of uncontrolled blood pressure - Reviewed appropriate blood pressure monitoring technique and reviewed goal blood pressure. Recommended to check home blood pressure and heart rate once weekly on Thursday with the goal to do three times week as he is more compliant. Record on paper as patient does not have smart phone.  - Recommend to continue metoprolol  succinate 50 mg BID, valsartan  80 mg daily Hyperlipidemia/ASCVD Risk Reduction: - Currently controlled with LDL-C below goal < 55 mg/dL given extensive ASCVD history. - Reviewed long term complications of  uncontrolled cholesterol - Recommend to continue rosuvastatin  20 mg daily, ezetimibe  10 mg daily Atrial Fibrillation: - Currently controlled - Enrolled patient in [employer] Cardimyopathy grant today. Will use for Eliquis  when next fill is due ~Feb-March 2026).  - Submitted diagnosis verification. Awaiting approval. Once approved will [facility] card [facility] [address]) BIN: [id number] PCN: [id number] GRP: [id number] ID: [id number]          Provided patient with Vein and V[facility] at [facility] phone number to outreach to schedule appt if he has not heard from them in 1-2 weeks.  Follow Up Plan: PCP 08/22/24, PharmD telephone 07/01/24(copy/paste from last note)   Medication Adherence Barriers Identified:   Access issues with any new medication or testing device: Yes Eliquis    Medication Adherence Barriers Addressed/Actions Taken:  Reviewed medication changes per plan from last clinical pharmacist note Medication Access for Eliquis  Will discuss medication access concerns with pharmacist Contacted pharmacy regarding Eliquis  [facility] [place]. Per Dolly patient last received 180 tablets or 90 supply of Eliquis  on 05/06/24. Provided [employer] information to be billed as secondary insurance. Per Daralyn, Patient will have to remind staff in March to bill his Med D card as primary and then to bill the [employer] information as secondary. Then after that, Per Dolly, it should stick.   Next clinical pharmacist appointment is scheduled for: 07/01/2024  Asif Muchow, CPhT [facility] Office: [phone] Email: [email or web address]   "

## 2024-06-17 ENCOUNTER — Ambulatory Visit (HOSPITAL_COMMUNITY): Admitting: Licensed Clinical Social Worker

## 2024-06-17 ENCOUNTER — Encounter (HOSPITAL_COMMUNITY): Payer: Self-pay

## 2024-06-26 ENCOUNTER — Other Ambulatory Visit: Payer: Self-pay

## 2024-06-26 NOTE — Patient Outreach (Signed)
 Complex Care Management   Visit Note  06/26/2024  Name:  Rodney Friedman MRN: [id number] DOB: 1945/11/13  Situation: Referral received for Complex Care Management related to CAD, HTN, history of prostate cancer I obtained verbal consent from Patient.  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 Verlin & Dr. 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    Assessment: Patient Reported Symptoms:  Cognitive Cognitive Status: Able to follow simple commands, Alert and oriented to person, place, and time, Normal speech and language skills Cognitive/Intellectual Conditions Management [RPT]: None reported or documented in medical history or problem list   Health Maintenance Behaviors: Annual physical exam, Stress  management Health Facilitated by: Rest, Stress management  Neurological Neurological Review of Symptoms: No symptoms reported Neurological Management Strategies: Medication therapy, Routine screening  HEENT HEENT Symptoms Reported: No symptoms reported HEENT Management Strategies: Routine screening    Cardiovascular Cardiovascular Symptoms Reported: Other: Other Cardiovascular Symptoms: Chronic BLE discoloration Does patient have uncontrolled Hypertension?: No Cardiovascular Management Strategies: Medication therapy, Routine screening Cardiovascular Comment: Patient reports he continues to decline vascular referral/scheduling appointment. He reports he does have their phone number if he changes his mind  Respiratory Respiratory Symptoms Reported: Shortness of breath Additional Respiratory Details: Patient reports continued shortness of breath with exertion, at baseline Respiratory Management Strategies: Adequate rest, Routine screening  Endocrine Endocrine Symptoms Reported: No symptoms reported Is patient diabetic?: Yes Is patient checking blood sugars at home?: No    Gastrointestinal Gastrointestinal Symptoms Reported: No symptoms reported Gastrointestinal Management Strategies: Coping strategies, Medication therapy    Genitourinary Genitourinary Symptoms Reported: Incontinence Additional Genitourinary Details: Patient reports continued issues with incontinence due to history of radiation treatment for prostate cancer. He has follow-up with oncology in March Genitourinary Management Strategies: Coping strategies, Incontinence garment/pad  Integumentary Integumentary Symptoms Reported: Other Other Integumentary Symptoms: Discoloration BLE Skin Management Strategies: Routine screening  Musculoskeletal Musculoskelatal Symptoms Reviewed: Back pain, Difficulty walking, Joint pain, Unsteady gait Musculoskeletal Management Strategies: Medical device, Medication therapy, Routine screening  (Cane as needed) Musculoskeletal  Comment: Patient denies falls since previous CMRN visit. Falls in the past year?: Yes Number of falls in past year: 2 or more Was there an injury with Fall?: No Fall Risk Category Calculator: 2 Patient Fall Risk Level: Moderate Fall Risk Patient at Risk for Falls Due to: Impaired balance/gait Fall risk Follow up: Falls evaluation completed, Education provided, Falls prevention discussed  Psychosocial Psychosocial Symptoms Reported: Other Other Psychosocial Conditions: Patient reports he continues to meet with his counselor they're wonderful and pyschiatrist. Patient reports feeling like his mental health is well managed Behavioral Management Strategies: Abstinence from substances, Counseling, Medication therapy Major Change/Loss/Stressor/Fears (CP): Medical condition, self Techniques to Cope with Loss/Stress/Change: Diversional activities, Medication, Counseling Quality of Family Relationships: supportive Do you feel physically threatened by others?: No    06/26/2024    PHQ2-9 Depression Screening   Little interest or pleasure in doing things    Feeling down, depressed, or hopeless    PHQ-2 - Total Score    Trouble falling or staying asleep, or sleeping too much    Feeling tired or having little energy    Poor appetite or overeating     Feeling bad about yourself - or that you are a failure or have let yourself or your family down    Trouble concentrating on things, such as reading the newspaper or watching television    Moving or speaking so slowly that other people could have noticed.  Or the opposite - being so fidgety or restless that you have been moving around a lot more than usual    Thoughts that you would be better off dead, or hurting yourself in some way    PHQ2-9 Total Score    If you checked off any problems, how difficult have these problems made it for you to do your work, take care of things at home, or get along with other people     Depression Interventions/Treatment      There were no vitals filed for this visit. Pain Scale: 0-10 Pain Score: 3  Pain Type: Chronic pain Pain Location: Back Pain Orientation: Mid, Lower  Medications Reviewed Today     Reviewed by [name] Rosaline SQUIBB, RN (Registered Nurse) on 06/26/24 at 0902  Med List Status: <None>   Medication Order Taking? Sig Documenting Provider Last Dose Status Informant  acetaminophen  (TYLENOL ) 325 MG tablet [id number]  Take 1-2 tablets (325-650 mg total) by mouth every 4 (four) hours as needed for mild pain. Setzer, Sandra J, PA-C  Active   amoxicillin  (AMOXIL ) 500 MG capsule [id number]  Take 1 capsule (500 mg total) by mouth 2 (two) times daily.  Patient taking differently: Take 500 mg by mouth 2 (two) times daily. Only for dental work.   Oley Bascom RAMAN, NP  Active   buPROPion  (WELLBUTRIN  SR) 200 MG 12 hr tablet [id number]  Take 1 tablet (200 mg total) by mouth every morning. Tasia Lung, MD  Active   buPROPion  ER (WELLBUTRIN  SR) 100 MG 12 hr tablet [id number]  1 qhs Plovsky, Gerald, MD  Active   calcium  carbonate (TUMS - DOSED IN MG ELEMENTAL CALCIUM ) 500 MG chewable tablet [id number]  Chew 1 tablet by mouth 2 (two) times daily. [provider]  Active            Med Note MAYER, [facility] R   Thu Aug 09, 2023 12:36 PM) Taking prn   cyanocobalamin  (VITAMIN B12) 500 MCG tablet [id number]  Take 1 tablet (500 mcg total) by mouth every other day.  Oley Bascom RAMAN, NP  Active   Denosumab (PROLIA ) [id number]  every 6 (six) months. [provider]  Active            Med Note DELSA LORAIN SQUIBB   Tue Oct 23, 2023 10:47 AM) Last dose Jun 29 2023  docusate sodium  (COLACE) 100 MG capsule [id number]  Take 1 capsule (100 mg total) by mouth 2 (two) times daily. Oley Bascom RAMAN, NP  Active            Med Note SOUNDRA, EBONY J   Fri Oct 26, 2023  8:04 AM) As needed  ELIQUIS  5 MG TABS tablet [id number]  TAKE 1 TABLET BY MOUTH TWICE A DAY Croitoru, Mihai, MD   Active   ezetimibe  (ZETIA ) 10 MG tablet [id number]  Take 1 tablet (10 mg total) by mouth daily. Oley Bascom RAMAN, NP  Active   gabapentin  (NEURONTIN ) 300 MG capsule [id number]  Take 1 capsule (300 mg total) by mouth daily. Tasia Lung, MD  Active   ketoconazole  (NIZORAL ) 2 % shampoo [id number]  Apply 1 Application topically 2 (two) times a week. [provider]  Active   latanoprost  (XALATAN ) 0.005 % ophthalmic solution [id number]  Place 1 drop into the left eye every other day. [provider]  Active Self           Med Note ROLENE EVERN PULLING A   Wed Jan 03, 2023  2:36 PM) Patient states that he needs a refill.  magnesium  oxide (MAG-OX) 400 (240 Mg) MG tablet [id number]  Take 400 mg by mouth daily. [provider]  Active   metFORMIN  (GLUCOPHAGE ) 500 MG tablet [id number]  TAKE 1 TABLET BY MOUTH 2 TIMES DAILY WITH A MEAL. Oley Bascom RAMAN, NP  Active   metoprolol  succinate (TOPROL -XL) 50 MG 24 hr tablet [id number]  Take 1 tablet (50 mg total) by mouth in the morning and at bedtime. Take with or immediately following a meal. Oley Bascom RAMAN, NP  Active   Misc Natural Products (GLUCOSAMINE CHOND COMPLEX/MSM) TABS [id number]  Take 1 tablet by mouth daily with breakfast. Nichols, Tonya S, NP  Active   rosuvastatin  (CRESTOR ) 20 MG tablet [id number]  Take 1 tablet (20 mg total) by mouth daily. Oley Bascom RAMAN, NP  Active   valsartan  (DIOVAN ) 80 MG tablet [id number]  Take 1 tablet (80 mg total) by mouth daily. Croitoru, Mihai, MD  Active             Recommendation:   Continue Current Plan of Care  Follow Up Plan:   Closing From:  Complex Care Management Patient has met all care management goals. Care Management case will be closed. Patient has been provided contact information should new needs arise.   Rosaline Finlay, RN MSN [place]  [facility] Population Health RN Care Manager Direct Dial: [phone]  Fax: [phone]

## 2024-06-26 NOTE — Patient Instructions (Signed)
 Visit Information  Thank you for taking time to visit with me today. Please don't hesitate to contact me if I can be of assistance to you before our next scheduled appointment.  Your next care management appointment is no further scheduled appointments.    Closing From: Complex Care Management. Patient has met all care management goals. Care Management case will be closed. Patient has been provided contact information should new needs arise.   Please call the care guide team at [phone] if you need to cancel, schedule, or reschedule an appointment.   Please call the Suicide and Crisis Lifeline: 988 call 1-800-273-TALK (toll free, 24 hour hotline) if you are experiencing a Mental Health or Behavioral Health Crisis or need someone to talk to.  Rosaline Finlay, RN MSN [place]  [facility] Population Health RN Care Manager Direct Dial: [phone]  Fax: [phone]   Following is a copy of your care plan:   Goals Addressed             This Visit's Progress    COMPLETED: [facility] RN Care Plan   On track    Problems:  Chronic Disease Management support and education needs related to CAD and history of prostate CA  Goal: Over the next 30 days the Patient will demonstrate Ongoing health management independence as evidenced by patient report of taking all medications as ordered, monitoring BP at home, and maintaining an active lifestyle         Interventions:   CAD Interventions: Assessed understanding of CAD diagnosis Medications reviewed including medications utilized in CAD treatment plan Provided education on importance of blood pressure control in management of CAD Counseled on the importance of exercise goals with target of 150 minutes per week Reviewed Importance of taking all medications as prescribed Educated patient on goal BP of <130/80 Discussed vascular referral. Patient prefers to hold off on scheduling appointment at this time   Evaluation of current treatment  plan related to chronic conditions self-management and patient's adherence to plan as established by provider. Discussed plans with patient for ongoing care management follow up and provided patient with direct contact information for care management team Evaluation of current treatment plan related to history of prostate CA and patient's adherence to plan as established by provider  Patient Self-Care Activities:  Attend all scheduled provider appointments Call pharmacy for medication refills 3-7 days in advance of running out of medications Call provider office for new concerns or questions  Perform all self care activities independently  Perform IADL's (shopping, preparing meals, housekeeping, managing finances) independently Take medications as prescribed    Plan:  No further follow up required: Patient has met care plan goals               Patient verbalized understanding of Care plan and visit instructions communicated this visit

## 2024-07-01 ENCOUNTER — Ambulatory Visit (HOSPITAL_COMMUNITY): Admitting: Licensed Clinical Social Worker

## 2024-07-01 ENCOUNTER — Other Ambulatory Visit: Payer: Self-pay

## 2024-07-11 ENCOUNTER — Ambulatory Visit: Admitting: Pulmonary Disease

## 2024-07-15 ENCOUNTER — Ambulatory Visit (HOSPITAL_COMMUNITY): Admitting: Licensed Clinical Social Worker

## 2024-07-30 ENCOUNTER — Ambulatory Visit (HOSPITAL_COMMUNITY): Admitting: Psychiatry

## 2024-07-31 ENCOUNTER — Ambulatory Visit: Admitting: Hematology and Oncology

## 2024-07-31 ENCOUNTER — Other Ambulatory Visit

## 2024-08-08 ENCOUNTER — Ambulatory Visit: Payer: Self-pay | Admitting: Nurse Practitioner

## 2024-08-22 ENCOUNTER — Ambulatory Visit: Admitting: Nurse Practitioner

## 2024-08-27 ENCOUNTER — Ambulatory Visit: Admitting: Nurse Practitioner
